# Patient Record
Sex: Female | Born: 1955 | Race: White | Hispanic: No | Marital: Married | Smoking: Former smoker
Health system: Southern US, Community
[De-identification: ages and names within clinical notes are randomized; demographics above are authoritative.]

## PROBLEM LIST (undated history)

## (undated) DIAGNOSIS — N189 Chronic kidney disease, unspecified: Secondary | ICD-10-CM

## (undated) DIAGNOSIS — J189 Pneumonia, unspecified organism: Secondary | ICD-10-CM

## (undated) DIAGNOSIS — G43909 Migraine, unspecified, not intractable, without status migrainosus: Secondary | ICD-10-CM

## (undated) DIAGNOSIS — M1990 Unspecified osteoarthritis, unspecified site: Secondary | ICD-10-CM

## (undated) DIAGNOSIS — M797 Fibromyalgia: Secondary | ICD-10-CM

## (undated) DIAGNOSIS — G709 Myoneural disorder, unspecified: Secondary | ICD-10-CM

## (undated) DIAGNOSIS — I639 Cerebral infarction, unspecified: Secondary | ICD-10-CM

## (undated) DIAGNOSIS — E119 Type 2 diabetes mellitus without complications: Secondary | ICD-10-CM

## (undated) DIAGNOSIS — E785 Hyperlipidemia, unspecified: Secondary | ICD-10-CM

## (undated) DIAGNOSIS — Z8739 Personal history of other diseases of the musculoskeletal system and connective tissue: Secondary | ICD-10-CM

## (undated) DIAGNOSIS — G4733 Obstructive sleep apnea (adult) (pediatric): Secondary | ICD-10-CM

## (undated) DIAGNOSIS — I10 Essential (primary) hypertension: Secondary | ICD-10-CM

## (undated) DIAGNOSIS — J449 Chronic obstructive pulmonary disease, unspecified: Secondary | ICD-10-CM

## (undated) HISTORY — DX: Myoneural disorder, unspecified: G70.9

## (undated) HISTORY — DX: Hyperlipidemia, unspecified: E78.5

## (undated) HISTORY — PX: TONSILLECTOMY: SUR1361

## (undated) HISTORY — DX: Essential (primary) hypertension: I10

## (undated) HISTORY — DX: Chronic obstructive pulmonary disease, unspecified: J44.9

## (undated) HISTORY — DX: Chronic kidney disease, unspecified: N18.9

## (undated) HISTORY — PX: HERNIA REPAIR: SHX51

---

## 2002-02-24 ENCOUNTER — Encounter: Payer: Self-pay | Admitting: Cardiovascular Disease

## 2002-02-24 ENCOUNTER — Inpatient Hospital Stay (HOSPITAL_COMMUNITY): Admission: EM | Admit: 2002-02-24 | Discharge: 2002-02-25 | Payer: Self-pay | Admitting: *Deleted

## 2004-09-28 ENCOUNTER — Ambulatory Visit: Payer: Self-pay | Admitting: Internal Medicine

## 2004-10-23 ENCOUNTER — Ambulatory Visit: Payer: Self-pay | Admitting: Internal Medicine

## 2004-11-07 ENCOUNTER — Ambulatory Visit: Payer: Self-pay | Admitting: Internal Medicine

## 2007-05-28 ENCOUNTER — Encounter (INDEPENDENT_AMBULATORY_CARE_PROVIDER_SITE_OTHER): Payer: Self-pay | Admitting: Internal Medicine

## 2007-05-28 ENCOUNTER — Inpatient Hospital Stay (HOSPITAL_COMMUNITY): Admission: EM | Admit: 2007-05-28 | Discharge: 2007-05-29 | Payer: Self-pay | Admitting: Emergency Medicine

## 2007-06-28 HISTORY — PX: LAPAROSCOPIC CHOLECYSTECTOMY: SUR755

## 2007-07-11 ENCOUNTER — Ambulatory Visit (HOSPITAL_COMMUNITY): Admission: RE | Admit: 2007-07-11 | Discharge: 2007-07-11 | Payer: Self-pay | Admitting: General Surgery

## 2007-07-11 ENCOUNTER — Encounter (INDEPENDENT_AMBULATORY_CARE_PROVIDER_SITE_OTHER): Payer: Self-pay | Admitting: General Surgery

## 2007-08-28 HISTORY — PX: UMBILICAL HERNIA REPAIR: SUR1181

## 2009-01-21 ENCOUNTER — Encounter: Admission: RE | Admit: 2009-01-21 | Discharge: 2009-01-21 | Payer: Self-pay | Admitting: Internal Medicine

## 2009-02-21 IMAGING — US US PELVIS COMPLETE
1 series · 14 of 25 positions shown · non-contrast
Comparison: NONE

CLINICAL DATA: Irregular menses. 

PELVIC ULTRASOUND WITH TRANSABDOMINAL AND TRANSVAGINAL IMAGING

[Series 1: us pelvis · 0.27mm/px · 14 of 40 slices shown]
[im 1/40]
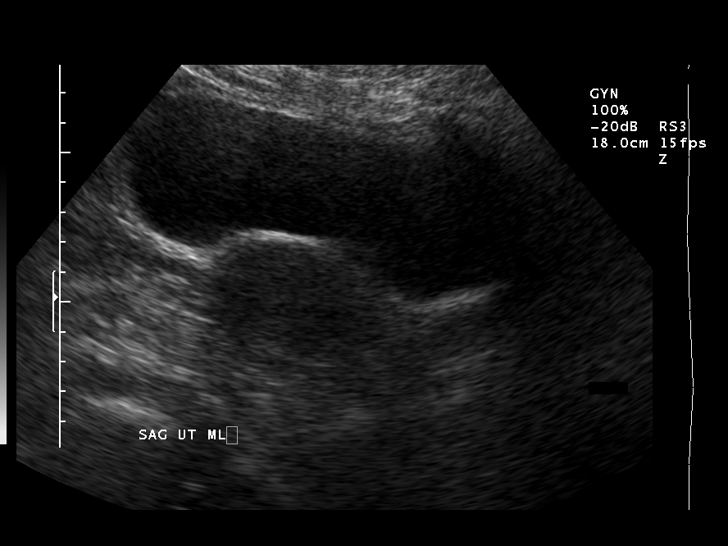
[im 4/40]
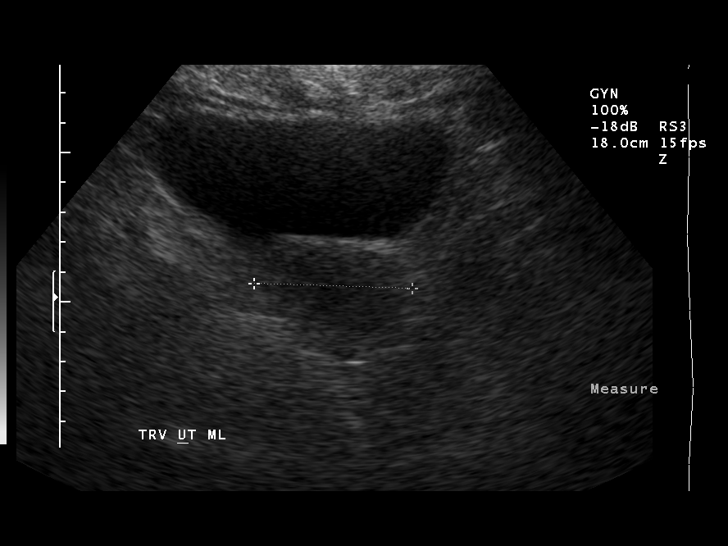
[im 7/40]
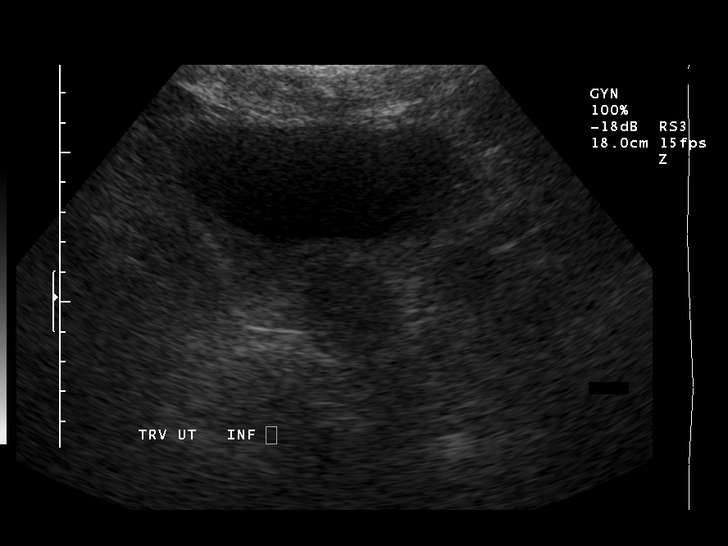
[im 10/40]
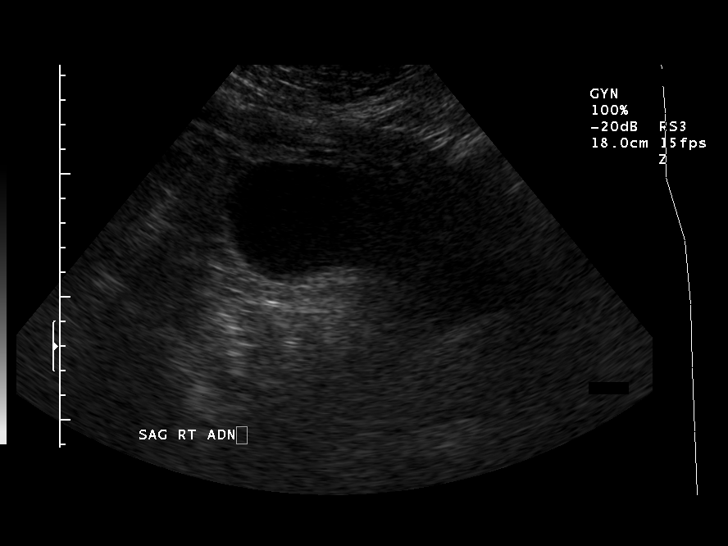
[im 14/40]
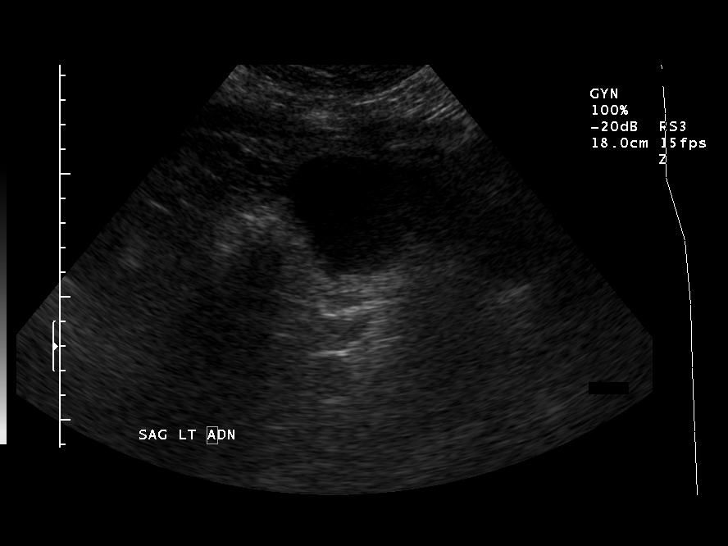
[im 15/40]
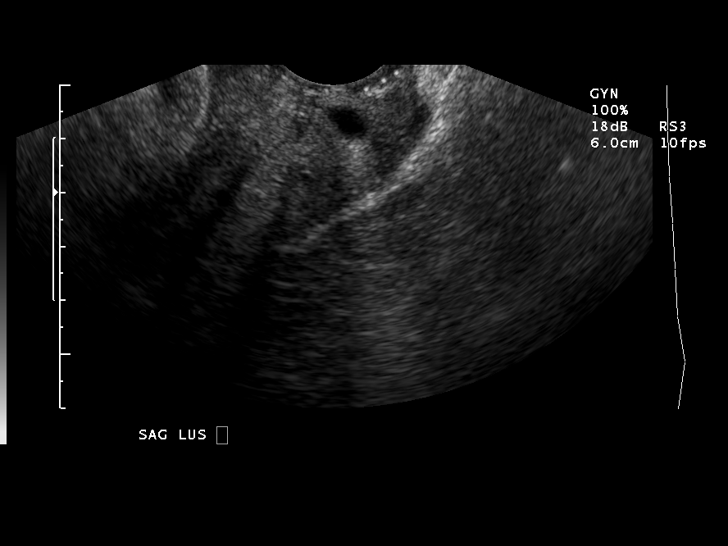
[im 18/40]
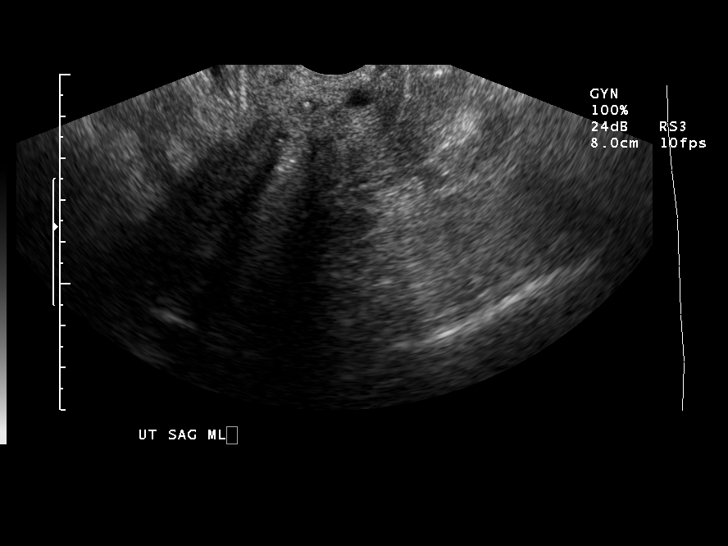
[im 22/40]
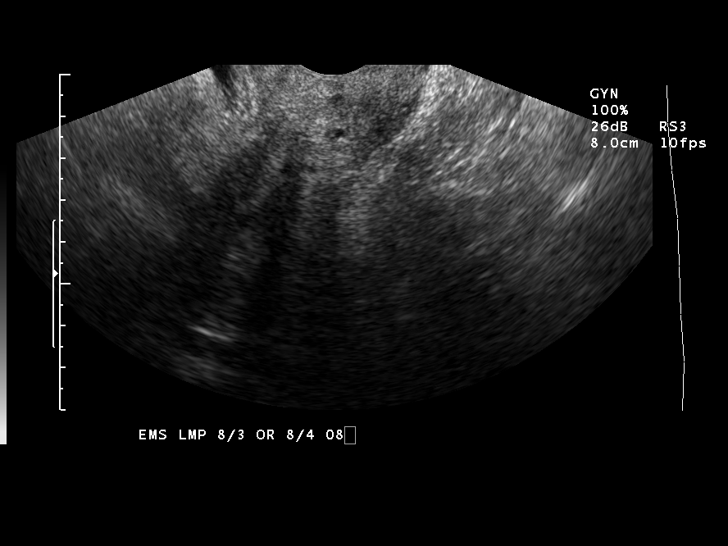
[im 25/40]
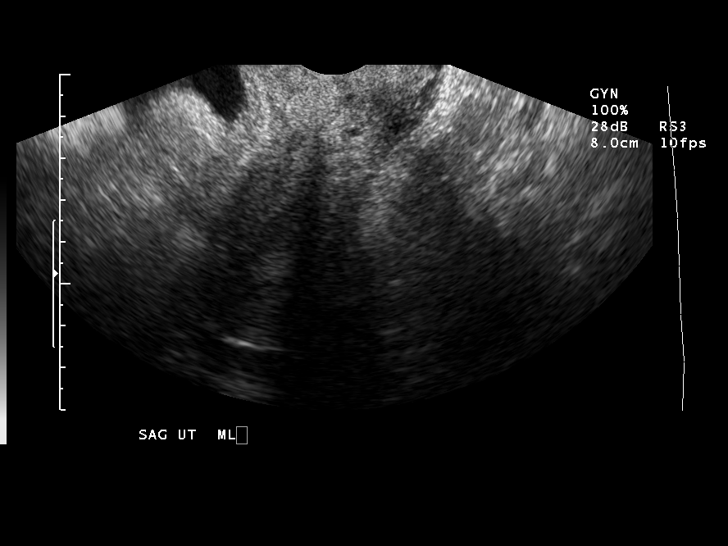
[im 27/40]
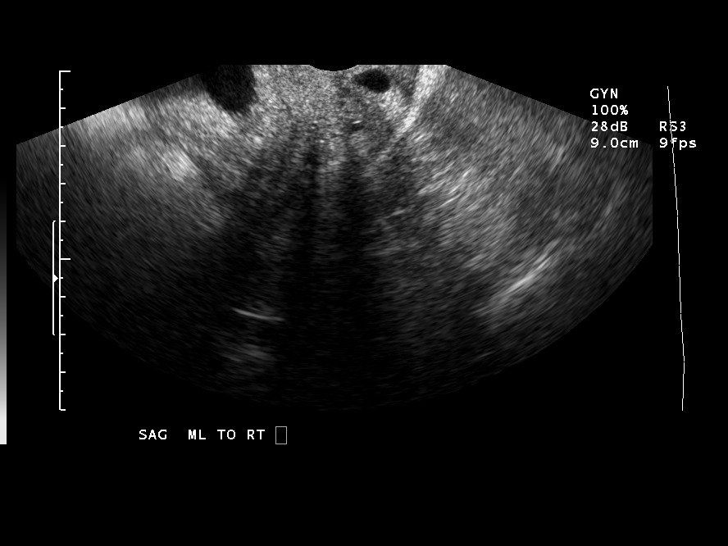
[im 30/40]
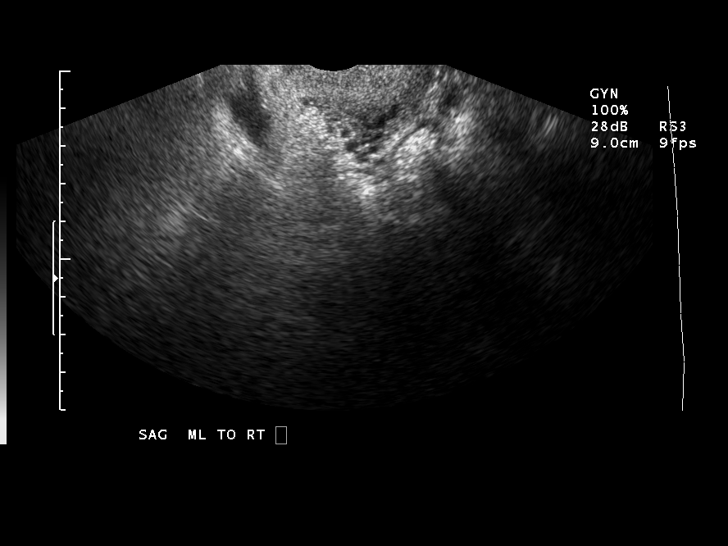
[im 33/40]
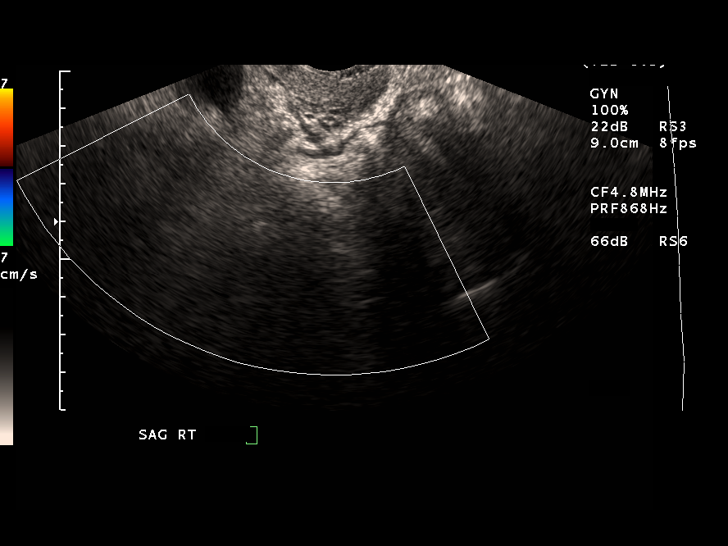
[im 36/40]
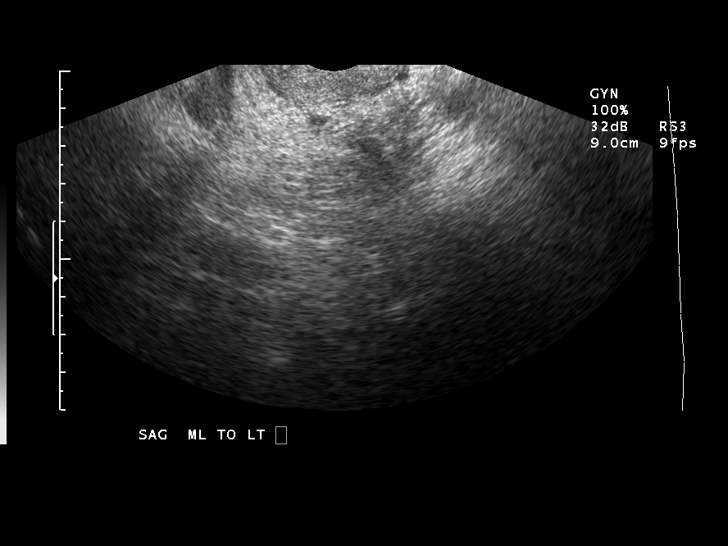
[im 40/40]
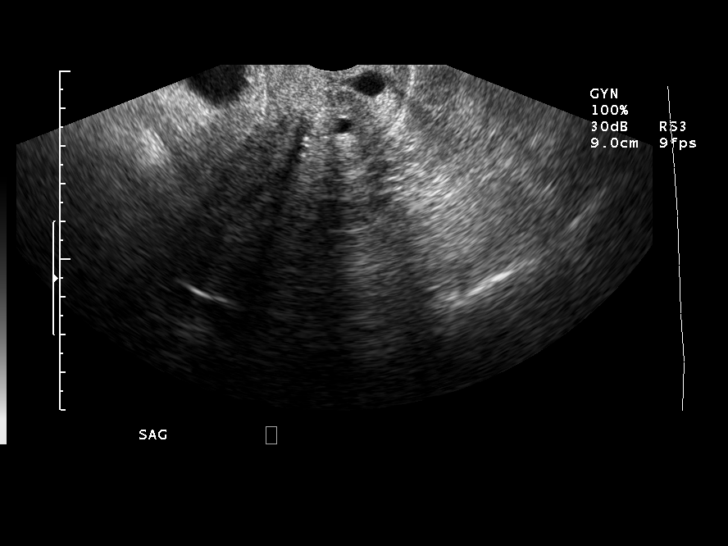

[14 of 25 positions shown; findings below may reference images not displayed]

FINDINGS: The uterus is 9.1 x 4.1 x 5.3 cm in size. Several 
cervical cysts are noted on the transvaginal views. The 
endometrial stripe is normal at 7.2 mm. Neither ovary could be 
identified on the transabdominal or transvaginal views.
IMPRESSION: Normal appearance of the uterus and endometrial 
stripe. Ovaries could not be visualized. Edwin Pakal Landcruiser, M.D. 
Date: 05/12/2007 DL  [REDACTED]

## 2010-06-17 ENCOUNTER — Inpatient Hospital Stay (HOSPITAL_COMMUNITY): Admission: EM | Admit: 2010-06-17 | Discharge: 2010-06-18 | Payer: Self-pay | Admitting: Emergency Medicine

## 2010-10-18 ENCOUNTER — Encounter (INDEPENDENT_AMBULATORY_CARE_PROVIDER_SITE_OTHER): Payer: Commercial Managed Care - PPO

## 2010-10-18 DIAGNOSIS — M7989 Other specified soft tissue disorders: Secondary | ICD-10-CM

## 2010-10-24 NOTE — Procedures (Unsigned)
DUPLEX DEEP VENOUS EXAM - LOWER EXTREMITY  INDICATION:  Bilateral lower extremity swelling, ankle induration.  HISTORY:  Edema:  10+ years. Trauma/Surgery: Pain:  No. PE:  No. Previous DVT:  No. Anticoagulants:  No. Other:  DUPLEX EXAM:               CFV   SFV   PopV  PTV      GSV               R  L  R  L  R  L  R    L   R  L Thrombosis    0  0  0  0  0  0  0    0   0  0 Spontaneous   +  +  +  +  +  +  0    0   +  + Phasic        +  +  +  +  +  +  NA   NA  +  + Augmentation  +  +  +  +  +  +  +    +   +  + Compressible  +  +  +  +  +  +  +    +   +  + Competent     +  +  +  +  +  +  +    +   0  NA  Legend:  + - yes  o - no  p - partial  D - decreased  IMPRESSION: 1. No DVT observed in the bilateral lower extremities. 2. 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 3. Reflux observed in the right greater saphenous vein.   _____________________________ Larina Earthly, M.D.  LT/MEDQ  D:  10/18/2010  T:  10/18/2010  Job:  [id number]

## 2010-11-08 LAB — BASIC METABOLIC PANEL
BUN: 18 mg/dL (ref 6–23)
BUN: 38 mg/dL — ABNORMAL HIGH (ref 6–23)
CO2: 31 mEq/L (ref 19–32)
Calcium: 8.2 mg/dL — ABNORMAL LOW (ref 8.4–10.5)
Chloride: 106 mEq/L (ref 96–112)
Chloride: 94 mEq/L — ABNORMAL LOW (ref 96–112)
Creatinine, Ser: 1.55 mg/dL — ABNORMAL HIGH (ref 0.4–1.2)
GFR calc Af Amer: 42 mL/min — ABNORMAL LOW (ref >60)
GFR calc non Af Amer: 35 mL/min — ABNORMAL LOW (ref >60)
GFR calc non Af Amer: 55 mL/min — ABNORMAL LOW (ref >60)
Glucose, Bld: 138 mg/dL — ABNORMAL HIGH (ref 70–99)
Glucose, Bld: 205 mg/dL — ABNORMAL HIGH (ref 70–99)
Potassium: 2.6 mEq/L — CL (ref 3.5–5.1)
Potassium: 3.4 mEq/L — ABNORMAL LOW (ref 3.5–5.1)
Sodium: 136 mEq/L (ref 135–145)

## 2010-11-08 LAB — COMPREHENSIVE METABOLIC PANEL
AST: 38 U/L — ABNORMAL HIGH (ref 0–37)
CO2: 37 mEq/L — ABNORMAL HIGH (ref 19–32)
Calcium: 9.7 mg/dL (ref 8.4–10.5)
Creatinine, Ser: 2.29 mg/dL — ABNORMAL HIGH (ref 0.4–1.2)
GFR calc Af Amer: 27 mL/min — ABNORMAL LOW (ref >60)
GFR calc non Af Amer: 22 mL/min — ABNORMAL LOW (ref >60)
Sodium: 128 mEq/L — ABNORMAL LOW (ref 135–145)
Total Protein: 7.6 g/dL (ref 6.0–8.3)

## 2010-11-08 LAB — GLUCOSE, CAPILLARY
Glucose-Capillary: 141 mg/dL — ABNORMAL HIGH (ref 70–99)
Glucose-Capillary: 388 mg/dL — ABNORMAL HIGH (ref 70–99)

## 2010-11-08 LAB — URINE MICROSCOPIC-ADD ON

## 2010-11-08 LAB — POTASSIUM: Potassium: 3.1 mEq/L — ABNORMAL LOW (ref 3.5–5.1)

## 2010-11-08 LAB — DIFFERENTIAL
Eosinophils Relative: 0 % (ref 0–5)
Lymphocytes Relative: 15 % (ref 12–46)
Lymphs Abs: 2.2 10*3/uL (ref 0.7–4.0)
Monocytes Relative: 7 % (ref 3–12)
Neutrophils Relative %: 77 % (ref 43–77)

## 2010-11-08 LAB — BLOOD GAS, ARTERIAL
Acid-Base Excess: 14.3 mmol/L — ABNORMAL HIGH (ref 0.0–2.0)
O2 Saturation: 96.8 %
Patient temperature: 98.6
TCO2: 30.8 mmol/L (ref 0–100)

## 2010-11-08 LAB — URINALYSIS, ROUTINE W REFLEX MICROSCOPIC
Nitrite: NEGATIVE
Specific Gravity, Urine: 1.024 (ref 1.005–1.030)
Urobilinogen, UA: 1 mg/dL (ref 0.0–1.0)
pH: 5 (ref 5.0–8.0)

## 2010-11-08 LAB — LIPID PANEL
Cholesterol: 170 mg/dL (ref 0–200)
HDL: 46 mg/dL (ref >39)
LDL Cholesterol: 87 mg/dL (ref 0–99)
Total CHOL/HDL Ratio: 3.7 RATIO
Triglycerides: 187 mg/dL — ABNORMAL HIGH (ref <150)
VLDL: 37 mg/dL (ref 0–40)

## 2010-11-08 LAB — CBC
Hemoglobin: 14.9 g/dL (ref 12.0–15.0)
MCH: 33.3 pg (ref 26.0–34.0)
MCHC: 35 g/dL (ref 30.0–36.0)
RDW: 14.6 % (ref 11.5–15.5)

## 2010-11-08 LAB — LIPASE, BLOOD: Lipase: 68 U/L — ABNORMAL HIGH (ref 11–59)

## 2010-11-08 LAB — HEMOGLOBIN A1C
Hgb A1c MFr Bld: 9.6 % — ABNORMAL HIGH (ref <5.7)
Mean Plasma Glucose: 229 mg/dL — ABNORMAL HIGH (ref <117)

## 2010-11-08 LAB — MAGNESIUM
Magnesium: 2.1 mg/dL (ref 1.5–2.5)
Magnesium: 2.3 mg/dL (ref 1.5–2.5)

## 2011-01-09 NOTE — H&P (Signed)
Jade [name], SOKOLOW             ACCOUNT NO.:  [id number]   MEDICAL RECORD NO.:  [id number]          PATIENT TYPE:  OBV   LOCATION:  1442                         FACILITY:  [facility]   PHYSICIAN:  Altha Harm, MDDATE OF BIRTH:  1956-04-03   DATE OF ADMISSION:  05/27/2007  DATE OF DISCHARGE:                              HISTORY & PHYSICAL   CHIEF COMPLAINT:  Abdominal pain and progressive exertional dyspnea.   HISTORY OF PRESENT ILLNESS:  This is a 55 year old lady who reports that  for the over the last 10 years she has been having worsening shortness  of breath. The patient has been treated with Spiriva and Advair in the  past by her allergist and has been on diuretics by her primary care  physician. As far as the patient knows, she does not have a history of  congestive heart failure but does have obstructive sleep apnea. She  states that in the last few weeks she has noted that she has been  becoming significantly more short of breath with minimal activity. She  states in the last two days she has been unable to perform her ADLs  secondary to the shortness of breath that she is experiencing. The  patient has had no fevers or chills. She has had nausea, vomiting or  diarrhea. Incidentally, the patient was sent to have a workup for  abdominal pain approximately two weeks ago and was found to have  gallstones. Because of the symptoms of abdominal pain that she has been  having, she has been referred to surgery and Olin Pia for evaluation  for surgery. In the emergency room today, the patient had a chest x-ray  performed which was read as acute interstitial edema. However, please  note that the patient has long-standing lymphedema.   The patient states that she has had a cardiology workup in the past, and  she was told by the cardiologist, Dr. Donnie Aho, that unless some drastic  changes were made she would not need to worry about retirement,  indicating that her life  expectancy would be shortened. The patient  during her workup here in the emergency room had an ABG done and was  found to be mildly hypoxic at 72, although her O2 saturations were  normal. The patient was told by the emergency room physician that she  needed admission, and now she is afraid to go home. Thus, the patient is  being brought in on an observation basis.   PAST MEDICAL HISTORY:  Is significant for:  1. Diabetes type 2.  2. Morbid obesity.  3. Obstructive sleep apnea.  4. Bilateral lower extremity edema, requiring the use of Lasix and      Zaroxolyn.   FAMILY HISTORY:  Significant for congestive heart failure in father, CVA  in her father and Alzheimer's dementia in her mother.   On social history, the patient works in an Scientist, research (physical sciences). She denies  any recent tobacco use. She uses no alcohol or drugs.   CURRENT MEDICATIONS:  1. Zaroxolyn.  2. Lasix.  3. Actos.  4. Metformin.  5. Potassium chloride.  6. Toprol-XL.  7. Benicar.   The patient does not know the doses of any of her medications.   ALLERGIES:  ARE TO CONTRAST MEDIA AND SULFA.   Her primary care physician is Dr. Wende Bushy in [facility], and her  cardiologist is Dr. Donnie Aho, also in [facility].   REVIEW OF SYSTEMS:  The patient reports pain in her right lower pleural  area in the right upper quadrant which she states has been there for  several months. The patient states that taking a deep breath yesterday  caused excruciating pain and that she was only able to take very shallow  breaths. She states that this has improved since receiving Lasix in the  emergency room. Otherwise, her review of systems are negative in the  review of 14 systems.   The patient has been diagnosed with obstructive sleep apnea and wears  CPAP at home. However, her CPAP has not been reevaluated for 2 years,  and the pressures have not been changed in that time. The patient denies  any PND or orthopnea. She denies any chest  pain. She denies any  excessive swelling of her lower extremities.   In the emergency room, studies performed included the following:  A  urinalysis which was negative for elements consistent with a urinary  tract infection. White blood cell count is 9.8, hemoglobin is 12.2,  hematocrit is 35.2, platelet count 307. Sodium 137, potassium 3.9,  chloride 107, bicarb 24, BUN 13, creatinine 0.8. D-dimer is negative at  0.37. A chest x-ray, two-view, was done which was read by the  radiologist as acute interstitial edema versus atypical pneumonia.   PHYSICAL EXAMINATION:  The patient is a very obese lady with very obese  expansive chest wall. The patient has a body habitus that is very  consistent with someone who may have pulmonary hypertension.  The patient has a very short neck. I am unable to evaluate her neck. Her  neck size is equally more than 30 cm.  HEENT EXAMINATION:  She is normocephalic, atraumatic. Pupils are equal,  round, and reactive to light and accommodation. Oropharynx is moist. No  exudate, erythema or lesions noted.  NECK EXAMINATION:  Again, the patient has a very short and obese neck,  thus prohibiting any meaningful neck examination.  CHEST EXAMINATION:  The patient has a very broad chest. She has normal  respiratory effort. There is no conversational dyspnea, and the  patient's lungs are clear to auscultation. The patient is sitting at  rest without any oxygen and appears to be in no distress whatsoever.  CARDIOVASCULAR:  The patient has distant heart sounds. I unable to  really ascertain whether or not she has a displacement in her PMI. She  has a normal S1 and S2, and there are no murmurs, rubs, or gallops  noted. No heaves or thrills on palpation.  ABDOMEN:  The patient has a markedly obese abdomen. She has no  significant tenderness on palpation. I am unable to appreciate any  masses or hepatosplenomegaly.  LYMPH NODE SURVEY:  She has got no cervical, axillary,  inguinal  lymphadenopathy.  MUSCULOSKELETAL:  Her bilateral lower extremities are edematous.  However, there is no significant pitting which indicates possibly long-  standing edema.  NEUROLOGICAL:  She has no focal neurologically deficits. Cranial nerves  II-XII are grossly intact.  MUSCULOSKELETAL:  The patient has no warmth, swelling or erythema around  the joints.  PSYCHIATRIC:  She is alert and oriented x3. She has normal insight and  cognition. She has good recent and remote recall.   ASSESSMENT AND PLAN:  This is a patient who presents with progressive  exertional dyspnea and findings on x-ray which may or may not represent  an acute process. Quite frankly, I do not believe that this patient has  any acute process, and I believe that she may have pulmonary  hypertension with some degree of cor pulmonale. The patient has received  IV Lasix, and at this point, I do not know her dose of Lasix that she  takes, so we will continue the IV Lasix to help in any excessive fluid  that she may be having. We will also get a 2-D echocardiogram to  evaluate for both right and left heart function and anatomy and also to  attempt to qualify whether the patient has any degree of pulmonary  hypertension. The patient is under the care of the cardiologist in [facility]. Thus, I do not see any reason to involve a cardiologist here as  the patient is not having any symptoms that would point to an acute  cardiac process. However, this patient may be benefit from consultation  with pulmonary once EF has been received. Certainly, the patient will  need to go home on oxygen, and as an outpatient, she will need to have  her CPAP reevaluated for need for increased pressure. The patient had  her sleep study done at [facility] and would like to  go back there for reevaluation of her CPAP and obstructive sleep apnea.  Once the patient has received her oxygen and restarted her back on her   usual medications, the patient can probably be discharged for the rest  of her evaluation to be accomplished as an outpatient. Just for  completeness, I will get troponins q.8h. to ensure the patient is not  having cardiac event. The patient will probably benefit from a CT with  contrast to better define her lung findings. However, the patient is  allergic to contrast media, and I am unsure that a CT without contrast  would be of much benefit. These are the measures we will take for the  patient's progressive exertional dyspnea, in addition to which the  patient will be continued on her medications for her hypertension and  her diabetes.      Altha Harm, MD  Electronically Signed     MAM/MEDQ  D:  05/27/2007  T:  05/28/2007  Job:  [id number]

## 2011-01-09 NOTE — Op Note (Signed)
Jade [name], Jade [name]             ACCOUNT NO.:  [id number]   MEDICAL RECORD NO.:  [id number]          PATIENT TYPE:  AMB   LOCATION:  DAY                          [facility]ACILITY[facility]  [facility]   PHYSICIAN:  Lennie Muckle, MD      DATE OF BIRTH:  [birth year]   DATE OF PROCEDURE:  07/11/2007  DATE OF DISCHARGE:                               OPERATIVE REPORT   PROCEDURE:  1. Laparoscopic cholecystectomy  2. Repar of umbilical hernia.   PREOP DIAGNOSIS:  1. Symptomatic cholelithiasis.  2. Umbilical hernia.   POSTOP DIAGNOSIS:  1. Symptomatic cholelithiasis.  2. Umbilical hernia.   SURGEON:  Bertram Savin, MD.   ASSISTANTMarijean Niemann.   FINDINGS:  Omental within the hernia sac at the umbilicus, enlarged  liver.   INDICATIONS FOR PROCEDURE:  Jade [name] is a 55 year old female who  had multiple years of difficulties with her gallbladder.  She did have  gallstones and intermittent right upper quadrant abdominal pain,  bloating and nausea.  She had also had complaints of umbilical hernia.  Informed consent was obtained for a laparoscopic cholecystectomy and  possible cholangiogram with possibility of repairing her hernia.   SPECIMEN:  Gallbladder.   ESTIMATED BLOOD LOSS:  Minimal.   No immediate complications.  No drains were placed.  General  endotracheal anesthesia.   DETAILS OF PROCEDURE:  [name] was identified in the preoperative  holding suite, taken to the operating room where she was placed in the  supine position.  After administration of general endotracheal  anesthesia a time-out procedure indicating the patient and the procedure  were performed.  Her abdomen was prepped and draped in the usual sterile  fashion.  Due to the umbilical hernia being present at the umbilicus, I  elected to place a 5 mm port in the epigastric region.  This was placed  with the Optiview under direct visualization with a camera.  The fascial  layers were identified upon entry into the  abdominal cavity.  Pneumoperitoneum was able to be established.  Upon inspection of the  abdomen omentum was noted to be incarcerated within the umbilical  hernia.  This was removed by blunt dissection after placing a 5 mm  trocar in the right side of the abdomen.  After fully reducing the  hernia an incision was placed through the skin above the defect of the  hernia.  A Hasson trocar was then placed into the abdominal cavity.  An  additional 5 mm trocar was then placed in the right side of the abdomen.  The fundus of the gallbladder was retracted up towards the head of the  patient.  The infundibulum was grasped away from the liver bed.  It was  a rather large liver and heavy which hindered some part of the  dissection, however, after carefully dissecting the peritoneum down near  the fundus of the gallbladder I was able to obtain a critical view of  the neck of the gallbladder as well as the cystic artery and the liver  bed.  The #5 mm trocar was upsized to a #11 mm  trocar.  Using a clip  applier, 3 clips were placed proximally on the distal portion of the  cystic duct, one proximally was transected with laparoscopic scissors.  Two clips were placed on the cystic artery, one distally was also  transected with laparoscopic scissors.  The remaining peritoneal  attachments were removed with the hook electrocautery.  There was a  minimal amount of spillage within the removal of the gallbladder.  It  was placed in an EndoCatch bag and removed from the abdomen.  The  abdomen was irrigated with 1 liter of saline.  No evidence of bleeding  or biliary drainage was noted at the end of the case.  The fascial  defect was closed with #0 Vicryl suture.  Skin was closed with #4-0  Monocryl.  Patient was then extubated and transported to the Post  Anesthesia Care Unit in stable condition.      Lennie Muckle, MD  Electronically Signed     ALA/MEDQ  D:  07/11/2007  T:  07/11/2007  Job:  [id number]

## 2011-01-09 NOTE — Discharge Summary (Signed)
NAMESERENIDY, [name]             ACCOUNT NO.:  [id number]   MEDICAL RECORD NO.:  [id number]          PATIENT TYPE:  INP   LOCATION:  1442                         FACILITY:  [facility]   PHYSICIAN:  Michaelyn Barter, M.D. DATE OF BIRTH:  06-07-1956   DATE OF ADMISSION:  05/27/2007  DATE OF DISCHARGE:  05/29/2007                               DISCHARGE SUMMARY   PRIMARY CARE PHYSICIAN:  Unknown.   FINAL DIAGNOSIS:  Exertional dyspnea.   SECONDARY DIAGNOSIS:  History of obstructive sleep apnea.   HISTORY OF PRESENT ILLNESS:  Jade [name] is a 55 year old female who  indicated that, over the last 10 years, she has experienced worsening  shortness of breath.  She follows up with an allergist.  She stated  that, over the few weeks leading up to admission, her shortness of  breath had progressed with minimal activity.  Over the two days leading  up to admission, she was unable to perform her ADLs secondary to  worsening shortness of breath.  She indicated that she had a  cardiovascular workup in the past and was told by her cardiologist, Dr.  Donnie Aho.   PAST MEDICAL HISTORY:  Please see that dictated by Dr. Marthann Schiller.   HOSPITAL COURSE:  #1.  ACUTE ON CHRONIC DYSPNEA:  Following admission to the emergency  department, an ABG was completed,  and the patient was found to be  mildly hypoxic.  A two-view chest x-ray was completed September 30.  It  revealed developing pulmonary interstitial edema.  Less likely, the  appearance could reflect interstitial-atypical pneumonia.  A CT scan of  the patient's chest was completed on the same date which revealed small  right effusion with overlying right lower lobe atelectasis.  Minimal  left basilar atelectasis was noted.  A few patchy areas of ground-glass  opacity with slightly mild inflammatory alveolitis was noted.  No  pulmonary edema or pneumonia were seen.  The patient was started on  oxygen.  A 2-D echocardiogram was completed on  October 1 which revealed  an overall left ventricular systolic function to be normal.  Left  ventricular EF was estimated to be 60%.  No diagnostic evidence of left  ventricular regional wall motion abnormalities were noted.  It was not  clear as to whether the patient's underlying etiology was pulmonary  versus cardiovascular related.  The patient does have a history of  obstructive sleep apnea, whether or not this contributed to her dyspnea  is questionable. Atelectasis versus the small pleural effusion may have  also contributed.  By the date of discharge, the patient indicated that  she felt better.  She did not demonstrate any obvious respiratory-  related complaints or distress during this hospitalization, and her  oxygen saturation remained in the upper 90s on room air.   #2.  HISTORY OF GALLSTONES:  The patient indicated that she had been  told she had gallstones in the past.  She never complained of any  abdominal pain during the course of this hospitalization.  She indicated  that she could follow up with general surgery after she was discharged.  By the date of discharge, the patient had no new complaints.  Her  temperature was 97.5, heart rate 77, respirations 23, blood pressure  130/67, O2 saturation 98% on room air.  The decision was made to  discharge the patient from the hospital.   She discharged home on:  1. Combivent 2 puffs 4 times a day.  2. Protonix 40 mg daily.   She was told to call Dr. Brandt Loosen for a followup appointment within  the next 1-2 weeks.      Michaelyn Barter, M.D.  Electronically Signed     OR/MEDQ  D:  06/28/2007  T:  06/28/2007  Job:  [id number]

## 2011-01-12 NOTE — Discharge Summary (Signed)
[facility]  Patient:    Jade [name], Jade [name] Visit Number: [id number] MRN: [id number]          Service Type: MED Location: [phone] Attending Physician:  Berry, Jonathan [place] Dictated by:   Marya Fossa, P.A. Admit Date:  02/24/2002 Discharge Date: 02/25/2002   CC:         Rafaela M. Riley Nearing, M.D.   Discharge Summary  DATE OF BIRTH: 28-Jan-1956  ADMISSION DIAGNOSES: 1. Chest pain rule out myocardial infarction. 2. No prior history of coronary artery disease. 3. Fibromyalgia. 4. Remote tobacco use.  DISCHARGE DIAGNOSES: Chest pain, resolved, ruled out with negative enzymes. Likely non-cardiac. Spiral CT negative for pulmonary embolism or aortic dissection.  HISTORY OF PRESENT ILLNESS: [name] is a 55 year old white female patient without history of coronary artery disease, diabetes, or family history of heart disease. She quit smoking in 1989. She does not know her lipid status. She does have fibromyalgia and obesity. Apparently on the morning of admission, she had pain in the chest around 10:00 oclock in the morning while she was working at her desk. There was no radiation. The pain was worse with deep breathing but no shortness of breath, diaphoresis, nausea, or vomiting. The symptoms persisted for another 45 minutes. She called EMS. They gave her one Nitroglycerin and one in the ER. No significant change. O2 improved her pain and then in addition, Nitroglycerin helped more.  PROCEDURES: None.  CONSULTATIONS: None.  COMPLICATIONS: None.  HOSPITAL COURSE: [name] was admitted to [facility] for chest pain, rule out MI on February 24, 2002. IV Heparin was started. Protonix and Vioxx were given. Coated aspirin was also given. Cardiac enzymes were negative times three. The patient remained stable overnight. She had no further chest pain. She had a spiral CT which was negative for pulmonary embolism, DVT,  or CV-dissection.  LABORATORY DATA: Sodium 138, potassium 4.2, BUN 14, creatinine 1.1, and glucose 141. Hemoglobin 13.5, INR 0.9.  DISPOSITION: It was felt that the patient was stable for discharge to home on February 25, 2002 by Dr. Domingo Sep.  Recommend follow-up outpatient Cardiolite.  DISCHARGE MEDICATIONS: 1. Enteric coated aspirin 81 mg per day. 2. Vioxx 25 mg per day for one week and then as needed. 3. Protonix 40 mg per day for one week and then as needed.  ACTIVITY: As tolerated with no strenuous activity until seen back in follow-up.  DIET: Low fat, low cholesterol, low salt diet.  SPECIAL INSTRUCTIONS: She will have a stress test on March 02, 2002 at 8:00 AM on the second floor of Dr. Renelda Mom office. She will follow-up with Dr. Allyson Sabal on March 04, 2002 at 10:30 AM. She may return to work on Monday. Dictated by:   Marya Fossa, P.A. Attending Physician:  Berry, Jonathan [place] DD:  02/25/02 TD:  02/27/02 Job: [phone] [id number]

## 2011-06-05 LAB — URINALYSIS, ROUTINE W REFLEX MICROSCOPIC
Glucose, UA: NEGATIVE
Hgb urine dipstick: NEGATIVE
Specific Gravity, Urine: 1.021
Urobilinogen, UA: 0.2

## 2011-06-05 LAB — BASIC METABOLIC PANEL
BUN: 33 — ABNORMAL HIGH
CO2: 34 — ABNORMAL HIGH
Chloride: 99
Creatinine, Ser: 1.03

## 2011-06-05 LAB — URINE MICROSCOPIC-ADD ON

## 2011-06-05 LAB — HEMOGLOBIN AND HEMATOCRIT, BLOOD: HCT: 37.3

## 2011-06-07 LAB — HEPATIC FUNCTION PANEL
ALT: 20
AST: 21
Albumin: 3.6
Alkaline Phosphatase: 44
Bilirubin, Direct: <0.1
Total Bilirubin: 0.8
Total Protein: 6.6

## 2011-06-07 LAB — DIFFERENTIAL
Basophils Absolute: 0
Basophils Absolute: 0.3 — ABNORMAL HIGH
Basophils Relative: 1
Basophils Relative: 3 — ABNORMAL HIGH
Eosinophils Absolute: 0.2
Eosinophils Absolute: 0.2
Eosinophils Relative: 3
Eosinophils Relative: 2
Lymphocytes Relative: 23
Lymphs Abs: 2.3
Monocytes Absolute: 0.5
Monocytes Absolute: 0.8 — ABNORMAL HIGH
Monocytes Relative: 6
Monocytes Relative: 8
Neutro Abs: 6.2
Neutrophils Relative %: 64

## 2011-06-07 LAB — COMPREHENSIVE METABOLIC PANEL
ALT: 16
AST: 16
Albumin: 3.2 — ABNORMAL LOW
Alkaline Phosphatase: 36 — ABNORMAL LOW
BUN: 7
Chloride: 104
Potassium: 3.5
Sodium: 136
Total Bilirubin: 0.8
Total Protein: 6.1

## 2011-06-07 LAB — CBC
HCT: 32.6 — ABNORMAL LOW
HCT: 35.2 — ABNORMAL LOW
Hemoglobin: 12.2
MCHC: 34.8
MCV: 93.4
Platelets: 261
Platelets: 307
RBC: 3.77 — ABNORMAL LOW
RDW: 14.4 — ABNORMAL HIGH
WBC: 7.9
WBC: 9.8

## 2011-06-07 LAB — POCT CARDIAC MARKERS
CKMB, poc: 1.1
Myoglobin, poc: 94.9
Operator id: 4001
Troponin i, poc: <0.05

## 2011-06-07 LAB — BASIC METABOLIC PANEL
BUN: 13
CO2: 24
Calcium: 8.7
GFR calc non Af Amer: >60
Glucose, Bld: 124 — ABNORMAL HIGH
Sodium: 137

## 2011-06-07 LAB — BASIC METABOLIC PANEL WITH GFR
Chloride: 107
Creatinine, Ser: 0.88
GFR calc Af Amer: >60
Potassium: 3.9

## 2011-06-07 LAB — BLOOD GAS, ARTERIAL
Acid-base deficit: 0.8
Bicarbonate: 23
Drawn by: 275531
O2 Saturation: 94.3
Patient temperature: 98.6
TCO2: 20.8
pCO2 arterial: 37
pH, Arterial: 7.41 — ABNORMAL HIGH
pO2, Arterial: 72.7 — ABNORMAL LOW

## 2011-06-07 LAB — D-DIMER, QUANTITATIVE: D-Dimer, Quant: 0.37

## 2011-06-07 LAB — URINALYSIS, ROUTINE W REFLEX MICROSCOPIC
Bilirubin Urine: NEGATIVE
Glucose, UA: NEGATIVE
Hgb urine dipstick: NEGATIVE
Ketones, ur: NEGATIVE
Nitrite: NEGATIVE
Protein, ur: NEGATIVE
Specific Gravity, Urine: 1.012
Urobilinogen, UA: 0.2
pH: 5.5

## 2011-06-07 LAB — B-NATRIURETIC PEPTIDE (CONVERTED LAB): Pro B Natriuretic peptide (BNP): 162 — ABNORMAL HIGH

## 2012-03-20 ENCOUNTER — Other Ambulatory Visit (INDEPENDENT_AMBULATORY_CARE_PROVIDER_SITE_OTHER): Payer: Medicare Other

## 2012-03-20 ENCOUNTER — Encounter: Payer: Self-pay | Admitting: Internal Medicine

## 2012-03-20 ENCOUNTER — Ambulatory Visit (INDEPENDENT_AMBULATORY_CARE_PROVIDER_SITE_OTHER): Payer: Medicare Other | Admitting: Internal Medicine

## 2012-03-20 VITALS — BP 108/70 | HR 72 | Temp 98.4°F | Resp 16 | Ht 62.0 in | Wt 221.0 lb

## 2012-03-20 DIAGNOSIS — E7800 Pure hypercholesterolemia, unspecified: Secondary | ICD-10-CM

## 2012-03-20 DIAGNOSIS — I872 Venous insufficiency (chronic) (peripheral): Secondary | ICD-10-CM

## 2012-03-20 DIAGNOSIS — N058 Unspecified nephritic syndrome with other morphologic changes: Secondary | ICD-10-CM

## 2012-03-20 DIAGNOSIS — Z1231 Encounter for screening mammogram for malignant neoplasm of breast: Secondary | ICD-10-CM

## 2012-03-20 DIAGNOSIS — E1149 Type 2 diabetes mellitus with other diabetic neurological complication: Secondary | ICD-10-CM | POA: Insufficient documentation

## 2012-03-20 DIAGNOSIS — E1129 Type 2 diabetes mellitus with other diabetic kidney complication: Secondary | ICD-10-CM

## 2012-03-20 DIAGNOSIS — Z23 Encounter for immunization: Secondary | ICD-10-CM

## 2012-03-20 DIAGNOSIS — Z79899 Other long term (current) drug therapy: Secondary | ICD-10-CM

## 2012-03-20 DIAGNOSIS — R609 Edema, unspecified: Secondary | ICD-10-CM

## 2012-03-20 LAB — CBC WITH DIFFERENTIAL/PLATELET
Basophils Absolute: 0.1 10*3/uL (ref 0.0–0.1)
HCT: 39 % (ref 36.0–46.0)
Lymphs Abs: 3.5 10*3/uL (ref 0.7–4.0)
MCV: 94.2 fl (ref 78.0–100.0)
Monocytes Absolute: 0.7 10*3/uL (ref 0.1–1.0)
Monocytes Relative: 6.1 % (ref 3.0–12.0)
Neutrophils Relative %: 61.2 % (ref 43.0–77.0)
Platelets: 379 10*3/uL (ref 150.0–400.0)
RDW: 13.3 % (ref 11.5–14.6)

## 2012-03-20 LAB — COMPREHENSIVE METABOLIC PANEL
ALT: 20 U/L (ref 0–35)
Albumin: 4 g/dL (ref 3.5–5.2)
CO2: 29 mEq/L (ref 19–32)
GFR: 45.64 mL/min — ABNORMAL LOW (ref >60.00)
Potassium: 3.9 mEq/L (ref 3.5–5.1)
Sodium: 134 mEq/L — ABNORMAL LOW (ref 135–145)
Total Bilirubin: 0.6 mg/dL (ref 0.3–1.2)
Total Protein: 7.8 g/dL (ref 6.0–8.3)

## 2012-03-20 LAB — URINALYSIS, ROUTINE W REFLEX MICROSCOPIC
Bilirubin Urine: NEGATIVE
Ketones, ur: NEGATIVE
Leukocytes, UA: NEGATIVE
Nitrite: NEGATIVE
Urobilinogen, UA: 0.2 (ref 0.0–1.0)
pH: 5.5 (ref 5.0–8.0)

## 2012-03-20 LAB — HEMOGLOBIN A1C: Hgb A1c MFr Bld: 10.5 % — ABNORMAL HIGH (ref 4.6–6.5)

## 2012-03-20 LAB — LIPID PANEL
Total CHOL/HDL Ratio: 5
Triglycerides: 164 mg/dL — ABNORMAL HIGH (ref 0.0–149.0)

## 2012-03-20 LAB — TSH: TSH: 3.38 u[IU]/mL (ref 0.35–5.50)

## 2012-03-20 MED ORDER — LINAGLIPTIN 5 MG PO TABS: 5.0000 mg | ORAL_TABLET | Freq: Every day | ORAL | Status: DC

## 2012-03-20 MED ORDER — INSULIN GLARGINE 100 UNIT/ML ~~LOC~~ SOLN: 30.0000 [IU] | Freq: Every day | SUBCUTANEOUS | Status: DC

## 2012-03-20 MED ORDER — EZETIMIBE-ATORVASTATIN 10-20 MG PO TABS: 1.0000 | ORAL_TABLET | Freq: Every day | ORAL | Status: DC

## 2012-03-20 MED ORDER — VASCULERA PO TABS: 1.0000 | ORAL_TABLET | Freq: Every day | ORAL | Status: DC

## 2012-03-20 MED ORDER — INSULIN PEN NEEDLE 31G X 8 MM MISC: 1.0000 | Freq: Four times a day (QID) | Status: DC

## 2012-03-20 MED ORDER — INSULIN ASPART 100 UNIT/ML ~~LOC~~ SOLN: 5.0000 [IU] | Freq: Three times a day (TID) | SUBCUTANEOUS | Status: DC

## 2012-03-20 NOTE — Assessment & Plan Note (Signed)
I have asked her to start vasculera and to decrease the edema in her legs

## 2012-03-20 NOTE — Assessment & Plan Note (Signed)
She will continue lasix and will start vasculera

## 2012-03-20 NOTE — Assessment & Plan Note (Signed)
She needs to start a statin 

## 2012-03-20 NOTE — Addendum Note (Signed)
Addended by: Etta Grandchild on: 03/20/2012 08:07 PM   Modules accepted: Orders

## 2012-03-20 NOTE — Assessment & Plan Note (Signed)
Her a1c is too high so I have asked her to start Novolog with meals, start tradjenta, and increase her lantus, also asked her to see diabetic education

## 2012-03-20 NOTE — Progress Notes (Signed)
Subjective:    Patient ID: Jade [name], female    DOB: February 14, 1956, 56 y.o.   MRN: [id number]  Diabetes She presents for her follow-up diabetic visit. She has type 2 diabetes mellitus. The initial diagnosis of diabetes was made 7 years ago. Her disease course has been worsening. There are no hypoglycemic associated symptoms. Pertinent negatives for hypoglycemia include no pallor. Associated symptoms include foot paresthesias, polydipsia, polyphagia and polyuria. Pertinent negatives for diabetes include no blurred vision, no chest pain, no fatigue, no foot ulcerations, no visual change, no weakness and no weight loss. There are no hypoglycemic complications. Symptoms are worsening. Diabetic complications include nephropathy and peripheral neuropathy. Current diabetic treatment includes intensive insulin program. She is compliant with treatment all of the time. Her weight is stable. She is following a generally healthy diet. Meal planning includes avoidance of concentrated sweets. She has not had a previous visit with a dietician. She never participates in exercise. Her home blood glucose trend is increasing steadily. Her breakfast blood glucose range is generally 180-200 mg/dl. Her lunch blood glucose range is generally >200 mg/dl. Her dinner blood glucose range is generally >200 mg/dl. Her highest blood glucose is >200 mg/dl. Her overall blood glucose range is >200 mg/dl. An ACE inhibitor/angiotensin II receptor blocker is contraindicated. She does not see a podiatrist.Eye exam is not current.      Review of Systems  Constitutional: Negative for fever, chills, weight loss, diaphoresis, activity change, appetite change, fatigue and unexpected weight change.  HENT: Negative.   Eyes: Negative.  Negative for blurred vision.  Respiratory: Negative for cough, chest tightness, shortness of breath, wheezing and stridor.   Cardiovascular: Positive for leg swelling. Negative for chest pain and palpitations.    Gastrointestinal: Negative for nausea, vomiting, abdominal pain, diarrhea, constipation, anal bleeding and rectal pain.  Genitourinary: Positive for polyuria.  Musculoskeletal: Negative for myalgias, back pain, joint swelling, arthralgias and gait problem.  Skin: Negative for color change, pallor, rash and wound.  Neurological: Negative.  Negative for weakness.  Hematological: Positive for polydipsia and polyphagia. Negative for adenopathy. Does not bruise/bleed easily.  Psychiatric/Behavioral: Negative.        Objective:   Physical Exam  Vitals reviewed. Constitutional: She is oriented to person, place, and time. She appears well-developed and well-nourished. No distress.  HENT:  Head: Normocephalic and atraumatic.  Mouth/Throat: Oropharynx is clear and moist. No oropharyngeal exudate.  Eyes: Conjunctivae are normal. Right eye exhibits no discharge. Left eye exhibits no discharge. No scleral icterus.  Neck: Normal range of motion. Neck supple. No JVD present. No tracheal deviation present. No thyromegaly present.  Cardiovascular: Normal rate, regular rhythm, normal heart sounds and intact distal pulses.  Exam reveals no gallop and no friction rub.   No murmur heard. Pulmonary/Chest: Effort normal and breath sounds normal. No stridor. No respiratory distress. She has no wheezes. She has no rales. She exhibits no tenderness.  Abdominal: Soft. Bowel sounds are normal. She exhibits no distension and no mass. There is no tenderness. There is no rebound and no guarding.  Musculoskeletal: Normal range of motion. She exhibits edema. She exhibits no tenderness.       Right lower leg: She exhibits swelling and edema. She exhibits no tenderness, no bony tenderness, no deformity and no laceration.       Left lower leg: She exhibits swelling and edema. She exhibits no tenderness, no bony tenderness, no deformity and no laceration.       Legs:  On her LE's B there is 1+ pitting edema with faint  erythema and small areas of brown brawny induration, there are no open wounds and no oozing  Lymphadenopathy:    She has no cervical adenopathy.  Neurological: She is alert and oriented to person, place, and time. She has normal strength. She displays no atrophy, no tremor and normal reflexes. No cranial nerve deficit or sensory deficit. She exhibits normal muscle tone. She displays a negative Romberg sign. She displays no seizure activity. Coordination and gait normal.  Reflex Scores:      Tricep reflexes are 1+ on the right side and 1+ on the left side.      Bicep reflexes are 1+ on the right side and 1+ on the left side.      Brachioradialis reflexes are 1+ on the right side and 1+ on the left side.      Patellar reflexes are 0 on the right side and 0 on the left side.      Achilles reflexes are 0 on the right side and 0 on the left side. Skin: Skin is warm and dry. No rash noted. She is not diaphoretic. There is erythema. No pallor.  Psychiatric: She has a normal mood and affect. Her behavior is normal. Judgment and thought content normal.     Lab Results  Component Value Date   WBC 11.8* 03/20/2012   HGB 13.3 03/20/2012   HCT 39.0 03/20/2012   PLT 379.0 03/20/2012   GLUCOSE 216* 03/20/2012   CHOL 233* 03/20/2012   TRIG 164.0* 03/20/2012   HDL 48.50 03/20/2012   LDLDIRECT 155.7 03/20/2012   LDLCALC  Value: 87        Total Cholesterol/HDL:CHD Risk Coronary Heart Disease Risk Table                     Men   Women  1/2 Average Risk   3.4   3.3  Average Risk       5.0   4.4  2 X Average Risk   9.6   7.1  3 X Average Risk  23.4   11.0        Use the calculated Patient Ratio above and the CHD Risk Table to determine the patient's CHD Risk.        ATP III CLASSIFICATION (LDL):  <100     mg/dL   Optimal  960-454  mg/dL   Near or Above                    Optimal  130-159  mg/dL   Borderline  098-119  mg/dL   High  >147     mg/dL   Very High 82/95/6213   ALT 20 03/20/2012   AST 24 03/20/2012   NA 134*  03/20/2012   K 3.9 03/20/2012   CL 94* 03/20/2012   CREATININE 1.3* 03/20/2012   BUN 25* 03/20/2012   CO2 29 03/20/2012   TSH 3.38 03/20/2012   HGBA1C 10.5* 03/20/2012       Assessment & Plan:

## 2012-03-20 NOTE — Patient Instructions (Signed)

## 2012-03-25 ENCOUNTER — Other Ambulatory Visit: Payer: Self-pay

## 2012-03-25 DIAGNOSIS — E1129 Type 2 diabetes mellitus with other diabetic kidney complication: Secondary | ICD-10-CM

## 2012-03-25 MED ORDER — FUROSEMIDE 80 MG PO TABS: 160.0000 mg | ORAL_TABLET | Freq: Two times a day (BID) | ORAL | Status: DC

## 2012-03-25 MED ORDER — METOPROLOL TARTRATE 25 MG PO TABS: 25.0000 mg | ORAL_TABLET | Freq: Every day | ORAL | Status: DC

## 2012-03-25 MED ORDER — INSULIN PEN NEEDLE 31G X 8 MM MISC: 1.0000 | Freq: Four times a day (QID) | Status: DC

## 2012-03-26 ENCOUNTER — Other Ambulatory Visit: Payer: Self-pay | Admitting: Internal Medicine

## 2012-03-26 DIAGNOSIS — E1129 Type 2 diabetes mellitus with other diabetic kidney complication: Secondary | ICD-10-CM

## 2012-03-26 MED ORDER — MICROLET LANCETS MISC: 1.0000 | Freq: Two times a day (BID) | Status: DC

## 2012-03-26 MED ORDER — GLUCOSE BLOOD VI STRP: ORAL_STRIP | Status: DC

## 2012-03-26 MED ORDER — METOPROLOL SUCCINATE ER 25 MG PO TB24: 25.0000 mg | ORAL_TABLET | Freq: Every day | ORAL | Status: DC

## 2012-04-18 ENCOUNTER — Encounter: Payer: Self-pay | Admitting: Dietician

## 2012-04-18 ENCOUNTER — Encounter: Payer: Medicare Other | Attending: Internal Medicine | Admitting: Dietician

## 2012-04-18 VITALS — Ht 62.0 in | Wt 218.8 lb

## 2012-04-18 DIAGNOSIS — E1129 Type 2 diabetes mellitus with other diabetic kidney complication: Secondary | ICD-10-CM

## 2012-04-18 DIAGNOSIS — Z713 Dietary counseling and surveillance: Secondary | ICD-10-CM | POA: Insufficient documentation

## 2012-04-18 DIAGNOSIS — E119 Type 2 diabetes mellitus without complications: Secondary | ICD-10-CM | POA: Insufficient documentation

## 2012-04-18 NOTE — Progress Notes (Signed)
Medical Nutrition Therapy:  Appt start time: 0915 end time:  1015.   Assessment:  Primary concerns today: Working to get blood glucose under better control.  Using the Phase One Diet.  Antifungal Diet by Heloise Purpura with Shon Baton, RN.  Not using grains, starches, or sugar in last 30 days.  Using Stevia sugar substitute.  Learned of the diet from a TV program.  Prior to starting the diet, she had daily diarrhea.  With the diet, she does not have this issue.  In the last 3 weeks, she has lost 2.2 lbs.  She has a history of DM for 7 years.  Currently, her A1C is at 10.5%.  She at times has pain in her knees, the tips of her toes and sometimes some numbness in her fingers.       BLOOD GLUCOSE: Fasting:100,115, 140's, 130's  The most recent numbers are lower. AC Lunch:145, 157, 135, 129, 135 AC Dinner:144, 157, 165 Bedtime: [phone],  MEDICATIONS: Med review completed.  Diabetes: Lantus, Novolog, Tradjenta   DIETARY INTAKE:  Usual eating pattern includes 2 meals and 2 snacks per day.  Everyday foods include vegetables, nuts, seeds, selected fruits.  Avoided foods include Grains, sugar, starches.    24-hr recall:  B ( AM): 10:30-11:00 AM Eggs 2, omlett, fried, or poached spinach, or ham or onion, cactus, chili peppers, sometimes avacado, cup warm water with lemon and chyanne pepper water then the V8 juice with Chia seeds. Snk ( AM): Nonr  L ( PM): 2:30-3:00 PM Tuna salad, 1 cup and 10 multigrain crackers, (no wheat, or soy, no corn, primarily seeds).  Every other day will have a bigh salad with grill chicken or ham or sliced [place].  Using olive oil and apple cider vinegar for salad dressing. Snk ( PM): 5:30 1/4 cup pumpkin seeds.  And carrot sticks. D ( PM): None Snk ( PM): 8:00 Cup of cottage cheese and 1 cup of berries, and 1 tabsp. Of sliced almonds and cup of cinnamon/apple spiced tea. Beverages: water with lemon, almond milk, 1-2 glasses low sodium V8 Juice, and herbal teas  (1-2 cups per day).  Usual physical activity: Currently not exercising.  But is willing to try to do walking in the water to condition herself for water aerobics classes in the future.  Recommend waling in the water to start at 20-30 minutes and work up to 150 minutes per week.  Estimated energy needs:Ht: 62 in  WT; 218.8 lb  BMI: 40.1 kg/m2  Adj WT: 145 lb 66 kg) 1300-1400 calories 145-150 g carbohydrates 95-100 g protein 35-37 g fat  Progress Towards Goal(s):  In progress.   Nutritional Diagnosis:  [place]-2.1 Inpaired nutrition utilization As related to glucose.  As evidenced by diagnosis and history of type 2 diabetes with A1C at 10.5%..    Intervention:  Nutrition Given her current recall and the limited carbohydrate and her enthusiasm for the diet and her success with the diet, I agreed that she could continue the the very basic, almost primitive dietary approach.  She notes that many times she has tried to gain control of her glucose levels only to fail.  She is adamant that she will work hard and wants support.  We reviewed reading food labels, counting carbohydrates and the use of the exchange list in assisting with carb counting.  Handouts given during visit include:  Living Well with Diabetes  Controlling Blood Glucose  Label with recommendations for glucose control   Monitoring/Evaluation:  Dietary  intake, exercise, blood glucose levels, and body weight in 8-12 weeks, to call for an appointment.Marland Kitchen

## 2012-04-26 ENCOUNTER — Telehealth: Payer: Self-pay | Admitting: *Deleted

## 2012-04-26 NOTE — Telephone Encounter (Signed)
Pt presented at [facility] requesting help to get her glucose test strips.  She stated pharmacy needed billing code for insurance.  Called Samia at [facility] and they need DX code.  This is given--250.40 per problem list.  They will process.

## 2012-04-28 ENCOUNTER — Encounter: Payer: Self-pay | Admitting: Dietician

## 2012-04-30 ENCOUNTER — Telehealth: Payer: Self-pay | Admitting: Internal Medicine

## 2012-04-30 DIAGNOSIS — E1129 Type 2 diabetes mellitus with other diabetic kidney complication: Secondary | ICD-10-CM

## 2012-04-30 MED ORDER — GLUCOSE BLOOD VI STRP: ORAL_STRIP | Status: DC

## 2012-04-30 NOTE — Telephone Encounter (Signed)
Rx printed as requested.

## 2012-04-30 NOTE — Telephone Encounter (Signed)
Caller: Cop[place]/Patient; Patient Name: Franky Macho; PCP: Sanda Linger (Adults only); Best Callback Phone Number: [phone].  Patient dropped off prescription to pharmacy on 04/23/12 for contour Net test strips.  MD did not put a diagonostic code on the prescription.  Pharmacy has contacted office to request information and then patent came by office on 04/26/12 and talked with office and they were supposed to let MD know.  Pharmacy is [facility] [phone].  Triager pulled chart and see note from Francee Piccolo, [facility] 04/26/12 at 11:56 saying she called [facility] and spoke with [place] and gave them diagnosis code 250.40.  Triager contacted [facility] at [facility] and spoke with pharmacist to see why strips have not been processed.  Was advised that they can't take a verbal, they need new script in writing with MD signature and diagnosis code and how many times per day patient is to test blood sugar.  OFFICE PLEASE SEND NEW PRESCRIPTION TO [facility] WITH ALL INFORMATION INCLUDED.  PATIENT IS NOT ABLE TO CHECK HER BLOOD SUGAR WITHOUT THESE STRIPS.  NEEDS THIS DONE ASAP.  PLEASE CALL PATIENT BACK AT [phone].

## 2012-05-01 ENCOUNTER — Telehealth: Payer: Self-pay | Admitting: *Deleted

## 2012-05-01 MED ORDER — MICROLET LANCETS MISC: 1.0000 | Freq: Three times a day (TID) | Status: DC

## 2012-05-01 NOTE — Telephone Encounter (Signed)
RX faxed to [facility] [phone]

## 2012-05-01 NOTE — Telephone Encounter (Signed)
Pt lleft msg on triage very upset she states been trying to ger refill on her strips & lancets for 1 week. Requesting someone to return call back. Called pt back she states pharmacy states md hasn't sent prescription. Inform pt rx for strips was sent yesterday on 04/30/12. Told pt will call pharmacy & i will give her a call back once i see what is going on, Called [facility] spoke with Gaspar Garbe he states did received rx for strips but not lancets. Sent rx for lancet with dx code. Called pt bck inform her what was happening and we sent rx for lancets...Raechel Chute

## 2012-05-26 ENCOUNTER — Ambulatory Visit (INDEPENDENT_AMBULATORY_CARE_PROVIDER_SITE_OTHER): Payer: Medicare Other | Admitting: Internal Medicine

## 2012-05-26 ENCOUNTER — Encounter: Payer: Self-pay | Admitting: Internal Medicine

## 2012-05-26 ENCOUNTER — Other Ambulatory Visit (INDEPENDENT_AMBULATORY_CARE_PROVIDER_SITE_OTHER): Payer: Medicare Other

## 2012-05-26 VITALS — BP 112/70 | HR 62 | Temp 98.7°F | Resp 16 | Wt 212.5 lb

## 2012-05-26 DIAGNOSIS — Z79899 Other long term (current) drug therapy: Secondary | ICD-10-CM

## 2012-05-26 DIAGNOSIS — E1140 Type 2 diabetes mellitus with diabetic neuropathy, unspecified: Secondary | ICD-10-CM | POA: Insufficient documentation

## 2012-05-26 DIAGNOSIS — E1142 Type 2 diabetes mellitus with diabetic polyneuropathy: Secondary | ICD-10-CM

## 2012-05-26 DIAGNOSIS — E1129 Type 2 diabetes mellitus with other diabetic kidney complication: Secondary | ICD-10-CM

## 2012-05-26 DIAGNOSIS — E1149 Type 2 diabetes mellitus with other diabetic neurological complication: Secondary | ICD-10-CM

## 2012-05-26 DIAGNOSIS — N058 Unspecified nephritic syndrome with other morphologic changes: Secondary | ICD-10-CM

## 2012-05-26 LAB — HEMOGLOBIN A1C: Hgb A1c MFr Bld: 7.3 % — ABNORMAL HIGH (ref 4.6–6.5)

## 2012-05-26 LAB — BASIC METABOLIC PANEL
Calcium: 9.4 mg/dL (ref 8.4–10.5)
Creatinine, Ser: 1 mg/dL (ref 0.4–1.2)
GFR: 58.61 mL/min — ABNORMAL LOW (ref >60.00)

## 2012-05-26 MED ORDER — METANX 3-90.314-2-35 MG PO CAPS: 1.0000 | ORAL_CAPSULE | Freq: Two times a day (BID) | ORAL | Status: DC

## 2012-05-26 NOTE — Assessment & Plan Note (Signed)
She has tried multiple agents with no relief, I have asked her to try metanx

## 2012-05-26 NOTE — Patient Instructions (Signed)

## 2012-05-26 NOTE — Progress Notes (Signed)
Subjective:    Patient ID: Jade [name], female    DOB: [birth year], 56 y.o.   MRN: [id number]  Diabetes She presents for her follow-up diabetic visit. She has type 2 diabetes mellitus. Her disease course has been improving. There are no hypoglycemic associated symptoms. Pertinent negatives for hypoglycemia include no dizziness, headaches, seizures, speech difficulty or tremors. Associated symptoms include foot paresthesias. Pertinent negatives for diabetes include no blurred vision, no chest pain, no fatigue, no foot ulcerations, no polydipsia, no polyphagia, no polyuria, no visual change, no weakness and no weight loss. There are no hypoglycemic complications. Symptoms are improving. Diabetic complications include nephropathy and peripheral neuropathy. Current diabetic treatment includes intensive insulin program and insulin injections. She is compliant with treatment most of the time. Her weight is stable. She is following a generally healthy diet. Meal planning includes avoidance of concentrated sweets. She has not had a previous visit with a dietician. She participates in exercise intermittently. Her home blood glucose trend is decreasing steadily. Her breakfast blood glucose range is generally 110-130 mg/dl. Her lunch blood glucose range is generally 130-140 mg/dl. Her dinner blood glucose range is generally 130-140 mg/dl. Her highest blood glucose is 130-140 mg/dl. Her overall blood glucose range is 130-140 mg/dl. An ACE inhibitor/angiotensin II receptor blocker is not being taken. She does not see a podiatrist.Eye exam is current.      Review of Systems  Constitutional: Negative for fever, chills, weight loss, diaphoresis, activity change, appetite change, fatigue and unexpected weight change.  HENT: Negative.   Eyes: Negative.  Negative for blurred vision.  Respiratory: Negative for cough, chest tightness, shortness of breath, wheezing and stridor.   Cardiovascular: Negative for chest pain,  palpitations and leg swelling.  Gastrointestinal: Negative.   Genitourinary: Negative.  Negative for polyuria.  Musculoskeletal: Negative.   Skin: Negative.   Neurological: Positive for numbness (burning, tingling in her feet). Negative for dizziness, tremors, seizures, syncope, facial asymmetry, speech difficulty, weakness, light-headedness and headaches.  Hematological: Negative for polydipsia, polyphagia and adenopathy. Does not bruise/bleed easily.  Psychiatric/Behavioral: Negative.        Objective:   Physical Exam  Vitals reviewed. Constitutional: She is oriented to person, place, and time. She appears well-developed and well-nourished. No distress.  HENT:  Head: Normocephalic and atraumatic.  Mouth/Throat: Oropharynx is clear and moist. No oropharyngeal exudate.  Eyes: Conjunctivae normal are normal. Right eye exhibits no discharge. Left eye exhibits no discharge. No scleral icterus.  Neck: Normal range of motion. Neck supple. No JVD present. No tracheal deviation present. No thyromegaly present.  Cardiovascular: Normal rate, regular rhythm, normal heart sounds and intact distal pulses.  Exam reveals no gallop and no friction rub.   No murmur heard. Pulmonary/Chest: Effort normal and breath sounds normal. No stridor. No respiratory distress. She has no wheezes. She has no rales. She exhibits no tenderness.  Abdominal: Soft. Bowel sounds are normal. She exhibits no distension and no mass. There is no tenderness. There is no rebound and no guarding.  Musculoskeletal: Normal range of motion. She exhibits edema (trace edema in BLE). She exhibits no tenderness.  Lymphadenopathy:    She has no cervical adenopathy.  Neurological: She is oriented to person, place, and time.  Skin: Skin is warm and dry. No rash noted. She is not diaphoretic. No erythema. No pallor.  Psychiatric: She has a normal mood and affect. Her behavior is normal. Judgment and thought content normal.      Lab  Results  Component Value Date  WBC 11.8* 03/20/2012   HGB 13.3 03/20/2012   HCT 39.0 03/20/2012   PLT 379.0 03/20/2012   GLUCOSE 216* 03/20/2012   CHOL 233* 03/20/2012   TRIG 164.0* 03/20/2012   HDL 48.50 03/20/2012   LDLDIRECT 155.7 03/20/2012   LDLCALC  Value: 87        Total Cholesterol/HDL:CHD Risk Coronary Heart Disease Risk Table                     Men   Women  1/2 Average Risk   3.4   3.3  Average Risk       5.0   4.4  2 X Average Risk   9.6   7.1  3 X Average Risk  23.4   11.0        Use the calculated Patient Ratio above and the CHD Risk Table to determine the patient's CHD Risk.        ATP III CLASSIFICATION (LDL):  <100     mg/dL   Optimal  469-629  mg/dL   Near or Above                    Optimal  130-159  mg/dL   Borderline  528-413  mg/dL   High  >244     mg/dL   Very High 08/29/7251   ALT 20 03/20/2012   AST 24 03/20/2012   NA 134* 03/20/2012   K 3.9 03/20/2012   CL 94* 03/20/2012   CREATININE 1.3* 03/20/2012   BUN 25* 03/20/2012   CO2 29 03/20/2012   TSH 3.38 03/20/2012   HGBA1C 10.5* 03/20/2012      Assessment & Plan:

## 2012-05-26 NOTE — Assessment & Plan Note (Signed)
I will check her a1c today to see if her blood sugars are well controlled

## 2012-05-27 ENCOUNTER — Encounter: Payer: Self-pay | Admitting: Internal Medicine

## 2012-05-27 LAB — C-PEPTIDE: C-Peptide: 3.35 ng/mL (ref 0.80–3.90)

## 2012-07-28 ENCOUNTER — Other Ambulatory Visit: Payer: Self-pay

## 2012-07-28 MED ORDER — POTASSIUM CHLORIDE CRYS ER 20 MEQ PO TBCR: EXTENDED_RELEASE_TABLET | ORAL | Status: DC

## 2012-07-28 NOTE — Telephone Encounter (Signed)
Received faxed refill request for potassium 2tab TID

## 2012-08-11 ENCOUNTER — Ambulatory Visit: Payer: Medicare Other | Admitting: Internal Medicine

## 2012-08-28 ENCOUNTER — Other Ambulatory Visit: Payer: Self-pay

## 2012-08-28 MED ORDER — POTASSIUM CHLORIDE CRYS ER 20 MEQ PO TBCR: EXTENDED_RELEASE_TABLET | ORAL | Status: DC

## 2012-09-24 ENCOUNTER — Ambulatory Visit: Payer: Medicare Other | Admitting: Internal Medicine

## 2012-09-26 ENCOUNTER — Encounter: Payer: Self-pay | Admitting: Internal Medicine

## 2012-09-26 ENCOUNTER — Ambulatory Visit (INDEPENDENT_AMBULATORY_CARE_PROVIDER_SITE_OTHER): Payer: Medicare Other | Admitting: Internal Medicine

## 2012-09-26 ENCOUNTER — Ambulatory Visit (INDEPENDENT_AMBULATORY_CARE_PROVIDER_SITE_OTHER)
Admission: RE | Admit: 2012-09-26 | Discharge: 2012-09-26 | Disposition: A | Discharge status: Home / Self Care | Payer: Medicare Other | Source: Ambulatory Visit | Attending: Internal Medicine | Admitting: Internal Medicine

## 2012-09-26 ENCOUNTER — Other Ambulatory Visit (INDEPENDENT_AMBULATORY_CARE_PROVIDER_SITE_OTHER): Payer: Medicare Other

## 2012-09-26 VITALS — BP 120/64 | HR 71 | Temp 98.6°F | Resp 16 | Wt 198.0 lb

## 2012-09-26 DIAGNOSIS — D72829 Elevated white blood cell count, unspecified: Secondary | ICD-10-CM

## 2012-09-26 DIAGNOSIS — E1129 Type 2 diabetes mellitus with other diabetic kidney complication: Secondary | ICD-10-CM

## 2012-09-26 DIAGNOSIS — M899 Disorder of bone, unspecified: Secondary | ICD-10-CM

## 2012-09-26 DIAGNOSIS — E7800 Pure hypercholesterolemia, unspecified: Secondary | ICD-10-CM

## 2012-09-26 DIAGNOSIS — R10811 Right upper quadrant abdominal tenderness: Secondary | ICD-10-CM

## 2012-09-26 DIAGNOSIS — Z1231 Encounter for screening mammogram for malignant neoplasm of breast: Secondary | ICD-10-CM

## 2012-09-26 DIAGNOSIS — M8580 Other specified disorders of bone density and structure, unspecified site: Secondary | ICD-10-CM

## 2012-09-26 DIAGNOSIS — M949 Disorder of cartilage, unspecified: Secondary | ICD-10-CM

## 2012-09-26 LAB — URINALYSIS, ROUTINE W REFLEX MICROSCOPIC
Ketones, ur: NEGATIVE
Specific Gravity, Urine: 1.01 (ref 1.000–1.030)
Total Protein, Urine: NEGATIVE
Urine Glucose: NEGATIVE

## 2012-09-26 LAB — COMPREHENSIVE METABOLIC PANEL
ALT: 20 U/L (ref 0–35)
AST: 21 U/L (ref 0–37)
BUN: 27 mg/dL — ABNORMAL HIGH (ref 6–23)
CO2: 29 mEq/L (ref 19–32)
Calcium: 9.4 mg/dL (ref 8.4–10.5)
Chloride: 100 mEq/L (ref 96–112)
Creatinine, Ser: 1.1 mg/dL (ref 0.4–1.2)
GFR: 53.7 mL/min — ABNORMAL LOW (ref >60.00)
Total Bilirubin: 0.5 mg/dL (ref 0.3–1.2)

## 2012-09-26 LAB — CBC WITH DIFFERENTIAL/PLATELET
Hemoglobin: 13.6 g/dL (ref 12.0–15.0)
Lymphocytes Relative: 28.4 % (ref 12.0–46.0)
Lymphs Abs: 2.7 10*3/uL (ref 0.7–4.0)
MCHC: 34.2 g/dL (ref 30.0–36.0)
MCV: 93 fl (ref 78.0–100.0)
RDW: 13.7 % (ref 11.5–14.6)

## 2012-09-26 LAB — LIPID PANEL
Cholesterol: 209 mg/dL — ABNORMAL HIGH (ref 0–200)
HDL: 37.6 mg/dL — ABNORMAL LOW (ref >39.00)
Triglycerides: 176 mg/dL — ABNORMAL HIGH (ref 0.0–149.0)

## 2012-09-26 LAB — LIPASE: Lipase: 38 U/L (ref 11.0–59.0)

## 2012-09-26 LAB — CORTISOL: Cortisol, Plasma: 6.5 ug/dL

## 2012-09-26 NOTE — Assessment & Plan Note (Signed)
I will check her plain films today to look for stones, stool burden She will do labs today to look for hepatitis, pancreatitis, uti, hematuria

## 2012-09-26 NOTE — Assessment & Plan Note (Signed)
Check her FLP today 

## 2012-09-26 NOTE — Progress Notes (Signed)
Subjective:    Patient ID: Jade [name], female    DOB: February 09, 1956, 57 y.o.   MRN: [id number]  Abdominal Pain This is a recurrent problem. The current episode started more than 1 month ago. The onset quality is gradual. The problem occurs intermittently. The problem has been unchanged. The pain is located in the RUQ. The pain is at a severity of 2/10. The pain is mild. The quality of the pain is aching and cramping. The abdominal pain radiates to the epigastric region and right flank. Associated symptoms include constipation and weight loss. Pertinent negatives include no anorexia, arthralgias, belching, diarrhea, dysuria, fever, flatus, frequency, headaches, hematochezia, hematuria, melena, myalgias, nausea or vomiting. Nothing aggravates the pain. The pain is relieved by nothing. She has tried nothing for the symptoms. Her past medical history is significant for abdominal surgery.      Review of Systems  Constitutional: Positive for weight loss, fatigue and unexpected weight change. Negative for fever, chills, diaphoresis, activity change and appetite change.  HENT: Negative.   Eyes: Negative.   Respiratory: Negative for apnea, shortness of breath, wheezing and stridor.   Cardiovascular: Negative for chest pain, palpitations and leg swelling.  Gastrointestinal: Positive for abdominal pain and constipation. Negative for nausea, vomiting, diarrhea, blood in stool, melena, hematochezia, abdominal distention, anal bleeding, rectal pain, anorexia and flatus.  Genitourinary: Positive for flank pain. Negative for dysuria, urgency, frequency, hematuria, decreased urine volume, vaginal bleeding, enuresis, difficulty urinating and dyspareunia.  Musculoskeletal: Negative for myalgias, back pain, joint swelling, arthralgias and gait problem.  Skin: Negative for color change, pallor, rash and wound.  Neurological: Positive for dizziness and light-headedness. Negative for tremors, seizures, syncope,  facial asymmetry, speech difficulty, weakness, numbness and headaches.  Hematological: Negative for adenopathy. Does not bruise/bleed easily.  Psychiatric/Behavioral: Negative.        Objective:   Physical Exam  Vitals reviewed. Constitutional: She is oriented to person, place, and time. She appears well-developed and well-nourished.  Non-toxic appearance. She does not have a sickly appearance. She does not appear ill. No distress.  HENT:  Head: Normocephalic and atraumatic.  Mouth/Throat: Oropharynx is clear and moist. No oropharyngeal exudate.  Eyes: Conjunctivae normal are normal. Right eye exhibits no discharge. Left eye exhibits no discharge. No scleral icterus.  Neck: Normal range of motion. Neck supple. No JVD present. No tracheal deviation present. No thyromegaly present.  Cardiovascular: Normal rate, regular rhythm, normal heart sounds and intact distal pulses.  Exam reveals no gallop and no friction rub.   No murmur heard. Pulmonary/Chest: Effort normal and breath sounds normal. No stridor. No respiratory distress. She has no wheezes. She has no rales. She exhibits no tenderness.  Abdominal: Soft. Normal appearance and bowel sounds are normal. She exhibits no shifting dullness, no distension, no pulsatile liver, no fluid wave, no abdominal bruit, no ascites, no pulsatile midline mass and no mass. There is no hepatosplenomegaly, splenomegaly or hepatomegaly. There is tenderness in the right upper quadrant. There is no rigidity, no rebound, no guarding, no CVA tenderness, no tenderness at McBurney's point and negative Murphy's sign. No hernia. Hernia confirmed negative in the ventral area, confirmed negative in the right inguinal area and confirmed negative in the left inguinal area.  Musculoskeletal: Normal range of motion. She exhibits no edema and no tenderness.       Right lower leg: She exhibits edema (trace) and deformity (mild diffuse erythema and induration). She exhibits no  tenderness, no bony tenderness, no swelling and no laceration.  Left lower leg: She exhibits edema (trace) and deformity (mild diffuse erythema and induration). She exhibits no tenderness, no bony tenderness, no swelling and no laceration.  Lymphadenopathy:    She has no cervical adenopathy.  Neurological: She is oriented to person, place, and time.  Skin: Skin is warm and dry. No rash noted. She is not diaphoretic. No erythema. No pallor.  Psychiatric: She has a normal mood and affect. Her behavior is normal. Judgment and thought content normal.      Lab Results  Component Value Date   WBC 11.8* 03/20/2012   HGB 13.3 03/20/2012   HCT 39.0 03/20/2012   PLT 379.0 03/20/2012   GLUCOSE 152* 05/26/2012   CHOL 233* 03/20/2012   TRIG 164.0* 03/20/2012   HDL 48.50 03/20/2012   LDLDIRECT 155.7 03/20/2012   LDLCALC  Value: 87        Total Cholesterol/HDL:CHD Risk Coronary Heart Disease Risk Table                     Men   Women  1/2 Average Risk   3.4   3.3  Average Risk       5.0   4.4  2 X Average Risk   9.6   7.1  3 X Average Risk  23.4   11.0        Use the calculated Patient Ratio above and the CHD Risk Table to determine the patient's CHD Risk.        ATP III CLASSIFICATION (LDL):  <100     mg/dL   Optimal  161-096  mg/dL   Near or Above                    Optimal  130-159  mg/dL   Borderline  045-409  mg/dL   High  >811     mg/dL   Very High 91/47/8295   ALT 20 03/20/2012   AST 24 03/20/2012   NA 138 05/26/2012   K 3.3* 05/26/2012   CL 100 05/26/2012   CREATININE 1.0 05/26/2012   BUN 23 05/26/2012   CO2 27 05/26/2012   TSH 3.38 03/20/2012   HGBA1C 7.3* 05/26/2012      Assessment & Plan:

## 2012-09-26 NOTE — Assessment & Plan Note (Signed)
She needs to get a DEXA scan done I will check her labs today including a Vit D level

## 2012-09-26 NOTE — Patient Instructions (Signed)
Abdominal Pain  Abdominal pain can be caused by many things. Your caregiver decides the seriousness of your pain by an examination and possibly blood tests and X-rays. Many cases can be observed and treated at home. Most abdominal pain is not caused by a disease and will probably improve without treatment. However, in many cases, more time must pass before a clear cause of the pain can be found. Before that point, it may not be known if you need more testing, or if hospitalization or surgery is needed.  HOME CARE INSTRUCTIONS   · Do not take laxatives unless directed by your caregiver.  · Take pain medicine only as directed by your caregiver.  · Only take over-the-counter or prescription medicines for pain, discomfort, or fever as directed by your caregiver.  · Try a clear liquid diet (broth, tea, or water) for as long as directed by your caregiver. Slowly move to a bland diet as tolerated.  SEEK IMMEDIATE MEDICAL CARE IF:   · The pain does not go away.  · You have a fever.  · You keep throwing up (vomiting).  · The pain is felt only in portions of the abdomen. Pain in the right side could possibly be appendicitis. In an adult, pain in the left lower portion of the abdomen could be colitis or diverticulitis.  · You pass bloody or black tarry stools.  MAKE SURE YOU:   · Understand these instructions.  · Will watch your condition.  · Will get help right away if you are not doing well or get worse.  Document Released: 05/23/2005 Document Revised: 11/05/2011 Document Reviewed: 03/31/2008  [employer]® Patient Information ©2013 [employer].

## 2012-09-26 NOTE — Assessment & Plan Note (Signed)
I will recheck her CBC today 

## 2012-09-29 ENCOUNTER — Encounter: Payer: Self-pay | Admitting: Internal Medicine

## 2012-09-29 LAB — VITAMIN D 1,25 DIHYDROXY
Vitamin D 1, 25 (OH)2 Total: 72 pg/mL (ref 18–72)
Vitamin D2 1, 25 (OH)2: <8 pg/mL
Vitamin D3 1, 25 (OH)2: 72 pg/mL

## 2012-10-24 ENCOUNTER — Ambulatory Visit (INDEPENDENT_AMBULATORY_CARE_PROVIDER_SITE_OTHER): Payer: Medicare Other | Admitting: Internal Medicine

## 2012-10-24 ENCOUNTER — Encounter: Payer: Self-pay | Admitting: Internal Medicine

## 2012-10-24 VITALS — BP 110/64 | HR 64 | Temp 97.7°F | Resp 16 | Wt 194.0 lb

## 2012-10-24 DIAGNOSIS — M8580 Other specified disorders of bone density and structure, unspecified site: Secondary | ICD-10-CM

## 2012-10-24 DIAGNOSIS — R10811 Right upper quadrant abdominal tenderness: Secondary | ICD-10-CM

## 2012-10-24 DIAGNOSIS — M899 Disorder of bone, unspecified: Secondary | ICD-10-CM

## 2012-10-24 DIAGNOSIS — Z87898 Personal history of other specified conditions: Secondary | ICD-10-CM | POA: Insufficient documentation

## 2012-10-24 DIAGNOSIS — Z8742 Personal history of other diseases of the female genital tract: Secondary | ICD-10-CM

## 2012-10-24 DIAGNOSIS — E1129 Type 2 diabetes mellitus with other diabetic kidney complication: Secondary | ICD-10-CM

## 2012-10-24 DIAGNOSIS — M949 Disorder of cartilage, unspecified: Secondary | ICD-10-CM

## 2012-10-24 NOTE — Patient Instructions (Signed)
Osteoporosis  Throughout your life, your body breaks down old bone and replaces it with new bone. As you get older, your body does not replace bone as quickly as it breaks it down. By the age of 30 years, most people begin to gradually lose bone because of the imbalance between bone loss and replacement. Some people lose more bone than others. Bone loss beyond a specified normal degree is considered osteoporosis.    Osteoporosis affects the strength and durability of your bones. The inside of the ends of your bones and your flat bones, like the bones of your pelvis, look like honeycomb, filled with tiny open spaces. As bone loss occurs, your bones become less dense. This means that the open spaces inside your bones become bigger and the walls between these spaces become thinner. This makes your bones weaker. Bones of a person with osteoporosis can become so weak that they can break (fracture) during minor accidents, such as a simple fall.  CAUSES    The following factors have been associated with the development of osteoporosis:   Smoking.   Drinking more than 2 alcoholic drinks several days per week.   Long-term use of certain medicines:   Corticosteroids.   Chemotherapy medicines.   Thyroid medicines.   Antiepileptic medicines.   Gonadal hormone suppression medicine.   Immunosuppression medicine.   Being underweight.   Lack of physical activity.   Lack of exposure to the sun. This can lead to vitamin D deficiency.   Certain medical conditions:   Certain inflammatory bowel diseases, such as Crohn's disease and ulcerative colitis.   Diabetes.   Hyperthyroidism.   Hyperparathyroidism.  RISK FACTORS  Anyone can develop osteoporosis. However, the following factors can increase your risk of developing osteoporosis:   Gender Women are at higher risk than men.   Age Being older than 50 years increases your risk.   Ethnicity White and Asian people have an increased risk.    Weight Being extremely underweight can increase your risk of osteoporosis.   Family history of osteoporosis Having a family member who has developed osteoporosis can increase your risk.  SYMPTOMS    Usually, people with osteoporosis have no symptoms.    DIAGNOSIS    Signs during a physical exam that may prompt your caregiver to suspect osteoporosis include:   Decreased height. This is usually caused by the compression of the bones that form your spine (vertebrae) because they have weakened and become fractured.   A curving or rounding of the upper back (kyphosis).  To confirm signs of osteoporosis, your caregiver may request a procedure that uses 2 low-dose X-ray beams with different levels of energy to measure your bone mineral density (dual-energy X-ray absorptiometry [DXA]). Also, your caregiver may check your level of vitamin D.  TREATMENT    The goal of osteoporosis treatment is to strengthen bones in order to decrease the risk of bone fractures. There are different types of medicines available to help achieve this goal. Some of these medicines work by slowing the processes of bone loss. Some medicines work by increasing bone density. Treatment also involves making sure that your levels of calcium and vitamin D are adequate.  PREVENTION    There are things you can do to help prevent osteoporosis. Adequate intake of calcium and vitamin D can help you achieve optimal bone mineral density. Regular exercise can also help, especially resistance and high-impact activities. If you smoke, quitting smoking is an important part of osteoporosis prevention.  MAKE 

## 2012-10-24 NOTE — Progress Notes (Signed)
Subjective:    Patient ID: Jade [name], female    DOB: 03-Jan-1956, 57 y.o.   MRN: [id number]  HPI  She came in today to discuss her recent DEXA scan (-2.4). She has decided she does not want to take a medication for this.  Review of Systems  Constitutional: Negative for fever, chills, diaphoresis, activity change, appetite change, fatigue and unexpected weight change.  HENT: Negative.   Eyes: Negative.   Respiratory: Negative for cough, chest tightness, shortness of breath, wheezing and stridor.   Cardiovascular: Negative.   Gastrointestinal: Negative for nausea, vomiting, abdominal pain, diarrhea, constipation and abdominal distention.  Endocrine: Negative.   Genitourinary: Negative.   Musculoskeletal: Negative.   Skin: Negative.   Allergic/Immunologic: Negative.   Neurological: Positive for numbness (both feet).  Hematological: Negative for adenopathy. Does not bruise/bleed easily.  Psychiatric/Behavioral: Negative.        Objective:   Physical Exam  Vitals reviewed. Constitutional: She is oriented to person, place, and time. She appears well-developed and well-nourished. No distress.  HENT:  Head: Normocephalic and atraumatic.  Mouth/Throat: Oropharynx is clear and moist. No oropharyngeal exudate.  Eyes: Conjunctivae are normal. Right eye exhibits no discharge. Left eye exhibits no discharge. No scleral icterus.  Neck: Normal range of motion. Neck supple. No JVD present. No tracheal deviation present. No thyromegaly present.  Cardiovascular: Normal rate, regular rhythm, normal heart sounds and intact distal pulses.  Exam reveals no gallop and no friction rub.   No murmur heard. Pulmonary/Chest: Effort normal and breath sounds normal. No stridor. No respiratory distress. She has no wheezes. She has no rales. She exhibits no tenderness.  Abdominal: Soft. Bowel sounds are normal. She exhibits no distension and no mass. There is no tenderness. There is no rebound and no  guarding.  Musculoskeletal: Normal range of motion. She exhibits no edema and no tenderness.  Lymphadenopathy:    She has no cervical adenopathy.  Neurological: She is oriented to person, place, and time.  Skin: Skin is warm and dry. No rash noted. She is not diaphoretic. No erythema. No pallor.  Psychiatric: She has a normal mood and affect. Her behavior is normal. Judgment and thought content normal.     Lab Results  Component Value Date   WBC 9.5 09/26/2012   HGB 13.6 09/26/2012   HCT 39.7 09/26/2012   PLT 356.0 09/26/2012   GLUCOSE 148* 09/26/2012   CHOL 209* 09/26/2012   TRIG 176.0* 09/26/2012   HDL 37.60* 09/26/2012   LDLDIRECT 136.4 09/26/2012   LDLCALC  Value: 87        Total Cholesterol/HDL:CHD Risk Coronary Heart Disease Risk Table                     Men   Women  1/2 Average Risk   3.4   3.3  Average Risk       5.0   4.4  2 X Average Risk   9.6   7.1  3 X Average Risk  23.4   11.0        Use the calculated Patient Ratio above and the CHD Risk Table to determine the patient's CHD Risk.        ATP III CLASSIFICATION (LDL):  <100     mg/dL   Optimal  829-562  mg/dL   Near or Above                    Optimal  130-159  mg/dL   Borderline  160-189  mg/dL   High  >161     mg/dL   Very High 09/60/4540   ALT 20 09/26/2012   AST 21 09/26/2012   NA 138 09/26/2012   K 3.7 09/26/2012   CL 100 09/26/2012   CREATININE 1.1 09/26/2012   BUN 27* 09/26/2012   CO2 29 09/26/2012   TSH 3.38 03/20/2012   HGBA1C 6.0 09/26/2012        Assessment & Plan:

## 2012-10-25 ENCOUNTER — Encounter: Payer: Self-pay | Admitting: Internal Medicine

## 2012-10-25 NOTE — Assessment & Plan Note (Signed)
She wants to see a GYN

## 2012-10-25 NOTE — Assessment & Plan Note (Signed)
No pain noted today  

## 2012-10-25 NOTE — Assessment & Plan Note (Signed)
She has decided not to treat this

## 2012-10-25 NOTE — Assessment & Plan Note (Signed)
She has good control of her blood sugars

## 2012-10-29 ENCOUNTER — Encounter: Payer: Self-pay | Admitting: Obstetrics & Gynecology

## 2012-11-14 ENCOUNTER — Ambulatory Visit (INDEPENDENT_AMBULATORY_CARE_PROVIDER_SITE_OTHER): Payer: Medicare Other | Admitting: Obstetrics & Gynecology

## 2012-11-14 ENCOUNTER — Other Ambulatory Visit (HOSPITAL_COMMUNITY)
Admission: RE | Admit: 2012-11-14 | Discharge: 2012-11-14 | Disposition: A | Discharge status: Home / Self Care | Payer: Medicare Other | Source: Ambulatory Visit | Attending: Obstetrics & Gynecology | Admitting: Obstetrics & Gynecology

## 2012-11-14 ENCOUNTER — Encounter: Payer: Self-pay | Admitting: Obstetrics & Gynecology

## 2012-11-14 VITALS — BP 121/73 | HR 78 | Ht 62.0 in | Wt 194.1 lb

## 2012-11-14 DIAGNOSIS — Z1151 Encounter for screening for human papillomavirus (HPV): Secondary | ICD-10-CM | POA: Insufficient documentation

## 2012-11-14 DIAGNOSIS — Z124 Encounter for screening for malignant neoplasm of cervix: Secondary | ICD-10-CM | POA: Insufficient documentation

## 2012-11-14 DIAGNOSIS — Z01419 Encounter for gynecological examination (general) (routine) without abnormal findings: Secondary | ICD-10-CM

## 2012-11-14 DIAGNOSIS — Z1231 Encounter for screening mammogram for malignant neoplasm of breast: Secondary | ICD-10-CM

## 2012-11-14 DIAGNOSIS — Z87898 Personal history of other specified conditions: Secondary | ICD-10-CM

## 2012-11-14 NOTE — Patient Instructions (Addendum)
Mammogram Tips Healthy women should begin getting mammograms every year or two once they reach age 58, and once a year when they reach age 41. Here are tips:  Find an experienced, high-volume center with accomplished radiologists. You can ask for their credentials.  Ask to see the certificate showing the center is approved by the [place].S[place] Food and Drug Administration.  Use the same center regularly, so it is easier to compare your new mammograms with your old ones.  Bring a list of places you have had mammograms, dates, biopsies or other breast treatments. Bring old mammograms with you or have them sent to your primary caregiver.  Describe any breast problems to your caregiver or the person doing the mammogram. Be ready to give past surgeries, birth control pills, hormone use, breast implants, growths, moles, breast scars and family or personal history of breast cancer.  Call your doctor or center to check on the mammogram if you hear nothing within 10 days. Do not assume everything was normal.  To protect your privacy, the mammogram results cannot be given over the phone or to anyone but you.  Radiation from a mammogram is very low and does not pose a radiation risk.  Mammograms can detect breast problems other than breast cancer.  You may be asked stand or sit in front of the X-ray machine.  Two small plastic or glass plates are placed around the breast when taking the X-ray.  If you are menstruating, schedule your mammogram a week after your menstrual period.  Do not wear deodorants, powder or perfume when getting a mammogram.  Wash your breasts and under your arms before getting a mammogram.  Wear cloths that are easy for you to undress and dress.  Arrive at the center at least 15 minutes before the mammogram is scheduled.  There may be slight discomfort during the mammogram, but it goes away shortly after the test.  Try to relax as much as possible during the mammogram.  Talk  to your caregiver if you do not understand the results of the mammogram.  Follow the recommendations of your caregiver regarding further tests and treatments if needed.  Get a second opinion if you are concerned or question the results of the mammogram, further tests or treatment if needed.  Continue with monthly self-breast exams and yearly caregiver exams even if the mammogram is normal.  Your caregiver may recommend getting a mammogram before age 44 and more often if you are at high risk for developing breast cancer. Document Released: 11/29/2005 Document Revised: 11/05/2011 Document Reviewed: 08/06/2008 [facility] Patient Information 2013 [place], [place]. Pap Test A Pap test is a procedure done in a clinic office to evaluate cells that are on the surface of the cervix. The cervix is the lower portion of the uterus and upper portion of the vagina. For some women, the cervical region has the potential to form cancer. With consistent evaluations by your caregiver, this type of cancer can be prevented.  If a Pap test is abnormal, it is most often a result of a previous exposure to human papillomavirus (HPV). HPV is a virus that can infect the cells of the cervix and cause dysplasia. Dysplasia is where the cells no longer look normal. If a woman has been diagnosed with high-grade or severe dysplasia, they are at higher risk of developing cervical cancer. People diagnosed with low-grade dysplasia should still be seen by their caregiver because there is a small chance that low-grade dysplasia could develop into cancer.  LET YOUR CAREGIVER KNOW ABOUT:  Recent sexually transmitted infection (STI) you have had.  Any new sex partners you have had.  History of previous abnormal Pap tests results.  History of previous cervical procedures you have had (colposcopy, biopsy, loop electrosurgical excision procedure [LEEP]).  Concerns you have had regarding unusual vaginal discharge.  History of pelvic  pain.  Your use of birth control. BEFORE THE PROCEDURE  Ask your caregiver when to schedule your Pap test. It is best not to be on your period if your caregiver uses a wooden spatula to collect cells or applies cells to a glass slide. Newer techniques are not so sensitive to the timing of a menstrual cycle.  Do not douche or have sexual intercourse for 24 hours before the test.   Do not use vaginal creams or tampons for 24 hours before the test.   Empty your bladder just before the test to lessen any discomfort.  PROCEDURE You will lie on an exam table with your feet in stirrups. A warm metal or plastic instrument (speculum) is placed in your vagina. This instrument allows your caregiver to see the inside of your vagina and look at your cervix. A small, plastic brush or wooden spatula is then used to collect cervical cells. These cells are placed in a lab specimen container. The cells are looked at under a microscope. A specialist will determine if the cells are normal.  AFTER THE PROCEDURE Make sure to get your test results.If your results come back abnormal, you may need further testing.  Document Released: 11/03/2002 Document Revised: 11/05/2011 Document Reviewed: 08/09/2011 [facility] Patient Information 2013 [place], [place].

## 2012-11-14 NOTE — Progress Notes (Signed)
Subjective:     Jade [name] is a 57 y.o. female here for a routine exam.    Gynecologic History No LMP recorded. Patient is postmenopausal. LMP: 2 years ago.  Contraception: none Last Pap: 2010. Results were: normal. Had 3 normal PAP smears. Abnormal PAP: 1998 for which she had cone biopsy  Last mammogram: 2008 Results were: normal History of STD: none Sexual intercourse: 3 years ago with husband Gyn Surgeries: none Gyn Family History: 2nd cousin: breast cancer, 2 great aunts: ovarian cancer  Colonoscopy done in 2009 and scheduled for repeat in 10 years.  Obstetric History OB History   Grav Para Term Preterm Abortions TAB SAB Ect Mult Living   3 1 1  2  2   1      # Outc Date GA Lbr Len/2nd Wgt Sex Del Anes PTL Lv   1 TRM 3/80 [redacted]w[redacted]d    SVD   Yes   2 SAB            3 SAB               Review of Systems Constitutional: negative Respiratory: negative Cardiovascular: negative Gastrointestinal: negative Genitourinary:positive for urinary stress incontinence since birth of son in 19    Objective:   Physical Examination: General appearance - alert, well appearing, and in no distress Abdomen - soft, nontender, nondistended, no masses or organomegaly Breasts - breasts appear normal, no suspicious masses, no skin or nipple changes or axillary nodes Pelvic - normal external genitalia, vulva, vagina, cervix, uterus and adnexa Extremities - peripheral pulses normal, no pedal edema, no clubbing or cyanosis   Assessment:     57 yo post-menopausal woman who presents for annual exam .    Plan:   - follow up PAP smear results - mammogram scheduled today - colonoscopy: up to date (see above)  Attestation of Attending Supervision of Resident: Evaluation and management procedures were performed by the [facility] Medicine Resident under my supervision.  I have seen and examined the patient, reviewed the resident's note and chart, and I agree with the management and plan.  Anibal Henderson, M.D. 11/14/2012 9:54 AM

## 2012-12-19 ENCOUNTER — Ambulatory Visit
Admission: RE | Admit: 2012-12-19 | Discharge: 2012-12-19 | Disposition: A | Discharge status: Home / Self Care | Payer: Medicare Other | Source: Ambulatory Visit | Attending: Obstetrics & Gynecology | Admitting: Obstetrics & Gynecology

## 2012-12-19 DIAGNOSIS — Z1231 Encounter for screening mammogram for malignant neoplasm of breast: Secondary | ICD-10-CM

## 2012-12-19 LAB — HM MAMMOGRAPHY: HM Mammogram: NORMAL

## 2013-02-23 ENCOUNTER — Encounter: Payer: Self-pay | Admitting: Internal Medicine

## 2013-02-23 ENCOUNTER — Other Ambulatory Visit (INDEPENDENT_AMBULATORY_CARE_PROVIDER_SITE_OTHER): Payer: Medicare Other

## 2013-02-23 ENCOUNTER — Ambulatory Visit (INDEPENDENT_AMBULATORY_CARE_PROVIDER_SITE_OTHER): Payer: Medicare Other | Admitting: Internal Medicine

## 2013-02-23 VITALS — BP 124/68 | HR 65 | Temp 98.1°F | Resp 16 | Wt 193.0 lb

## 2013-02-23 DIAGNOSIS — Z9119 Patient's noncompliance with other medical treatment and regimen: Secondary | ICD-10-CM

## 2013-02-23 DIAGNOSIS — E7800 Pure hypercholesterolemia, unspecified: Secondary | ICD-10-CM

## 2013-02-23 DIAGNOSIS — Z9114 Patient's other noncompliance with medication regimen: Secondary | ICD-10-CM

## 2013-02-23 DIAGNOSIS — E1129 Type 2 diabetes mellitus with other diabetic kidney complication: Secondary | ICD-10-CM

## 2013-02-23 DIAGNOSIS — Z91148 Patient's other noncompliance with medication regimen for other reason: Secondary | ICD-10-CM

## 2013-02-23 DIAGNOSIS — Z91199 Patient's noncompliance with other medical treatment and regimen due to unspecified reason: Secondary | ICD-10-CM

## 2013-02-23 LAB — BASIC METABOLIC PANEL
CO2: 29 mEq/L (ref 19–32)
Chloride: 101 mEq/L (ref 96–112)
Sodium: 140 mEq/L (ref 135–145)

## 2013-02-23 LAB — LIPID PANEL
Cholesterol: 209 mg/dL — ABNORMAL HIGH (ref 0–200)
HDL: 55.2 mg/dL (ref >39.00)
Total CHOL/HDL Ratio: 4
Triglycerides: 98 mg/dL (ref 0.0–149.0)
VLDL: 19.6 mg/dL (ref 0.0–40.0)

## 2013-02-23 NOTE — Assessment & Plan Note (Signed)
She tells me that she has decided to get off of the "pharmaceutical hamster wheel" and has therefore stopped her meds for diabetes I will check her A1C today and will advise her of the results

## 2013-02-23 NOTE — Assessment & Plan Note (Signed)
I will check her FLP today at her request though she tells me that she will not take a statin

## 2013-02-23 NOTE — Patient Instructions (Signed)
Type 2 Diabetes Mellitus, Adult Type 2 diabetes mellitus, often simply referred to as type 2 diabetes, is a long-lasting (chronic) disease. In type 2 diabetes, the pancreas does not make enough insulin (a hormone), the cells are less responsive to the insulin that is made (insulin resistance), or both. Normally, insulin moves sugars from food into the tissue cells. The tissue cells use the sugars for energy. The lack of insulin or the lack of normal response to insulin causes excess sugars to build up in the blood instead of going into the tissue cells. As a result, high blood sugar (hyperglycemia) develops. The effect of high sugar (glucose) levels can cause many complications. Type 2 diabetes was also previously called adult-onset diabetes but it can occur at any age.  RISK FACTORS  A person is predisposed to developing type 2 diabetes if someone in the family has the disease and also has one or more of the following primary risk factors:  Overweight.  An inactive lifestyle.  A history of consistently eating high-calorie foods. Maintaining a normal weight and regular physical activity can reduce the chance of developing type 2 diabetes. SYMPTOMS  A person with type 2 diabetes may not show symptoms initially. The symptoms of type 2 diabetes appear slowly. The symptoms include:  Increased thirst (polydipsia).  Increased urination (polyuria).  Increased urination during the night (nocturia).  Weight loss. This weight loss may be rapid.  Frequent, recurring infections.  Tiredness (fatigue).  Weakness.  Vision changes, such as blurred vision.  Fruity smell to your breath.  Abdominal pain.  Nausea or vomiting.  Cuts or bruises which are slow to heal.  Tingling or numbness in the hands or feet. DIAGNOSIS Type 2 diabetes is frequently not diagnosed until complications of diabetes are present. Type 2 diabetes is diagnosed when symptoms or complications are present and when blood  glucose levels are increased. Your blood glucose level may be checked by one or more of the following blood tests:  A fasting blood glucose test. You will not be allowed to eat for at least 8 hours before a blood sample is taken.  A random blood glucose test. Your blood glucose is checked at any time of the day regardless of when you ate.  A hemoglobin A1c blood glucose test. A hemoglobin A1c test provides information about blood glucose control over the previous 3 months.  An oral glucose tolerance test (OGTT). Your blood glucose is measured after you have not eaten (fasted) for 2 hours and then after you drink a glucose-containing beverage. TREATMENT   You may need to take insulin or diabetes medicine daily to keep blood glucose levels in the desired range.  You will need to match insulin dosing with exercise and healthy food choices. The treatment goal is to maintain the before meal blood sugar (preprandial glucose) level at 70 130 mg/dL. HOME CARE INSTRUCTIONS   Have your hemoglobin A1c level checked twice a year.  Perform daily blood glucose monitoring as directed by your caregiver.  Monitor urine ketones when you are ill and as directed by your caregiver.  Take your diabetes medicine or insulin as directed by your caregiver to maintain your blood glucose levels in the desired range.  Never run out of diabetes medicine or insulin. It is needed every day.  Adjust insulin based on your intake of carbohydrates. Carbohydrates can raise blood glucose levels but need to be included in your diet. Carbohydrates provide vitamins, minerals, and fiber which are an essential part of   a healthy diet. Carbohydrates are found in fruits, vegetables, whole grains, dairy products, legumes, and foods containing added sugars.    Eat healthy foods. Alternate 3 meals with 3 snacks.  Lose weight if overweight.  Carry a medical alert card or wear your medical alert jewelry.  Carry a 15 gram  carbohydrate snack with you at all times to treat low blood glucose (hypoglycemia). Some examples of 15 gram carbohydrate snacks include:  Glucose tablets, 3 or 4   Glucose gel, 15 gram tube  Raisins, 2 tablespoons (24 grams)  Jelly beans, 6  Animal crackers, 8  Regular pop, 4 ounces (120 mL)  Gummy treats, 9  Recognize hypoglycemia. Hypoglycemia occurs with blood glucose levels of 70 mg/dL and below. The risk for hypoglycemia increases when fasting or skipping meals, during or after intense exercise, and during sleep. Hypoglycemia symptoms can include:  Tremors or shakes.  Decreased ability to concentrate.  Sweating.  Increased heart rate.  Headache.  Dry mouth.  Hunger.  Irritability.  Anxiety.  Restless sleep.  Altered speech or coordination.  Confusion.  Treat hypoglycemia promptly. If you are alert and able to safely swallow, follow the 15:15 rule:  Take 15 20 grams of rapid-acting glucose or carbohydrate. Rapid-acting options include glucose gel, glucose tablets, or 4 ounces (120 mL) of fruit juice, regular soda, or low fat milk.  Check your blood glucose level 15 minutes after taking the glucose.  Take 15 20 grams more of glucose if the repeat blood glucose level is still 70 mg/dL or below.  Eat a meal or snack within 1 hour once blood glucose levels return to normal.    Be alert to polyuria and polydipsia which are early signs of hyperglycemia. An early awareness of hyperglycemia allows for prompt treatment. Treat hyperglycemia as directed by your caregiver.  Engage in at least 150 minutes of moderate-intensity physical activity a week, spread over at least 3 days of the week or as directed by your caregiver. In addition, you should engage in resistance exercise at least 2 times a week or as directed by your caregiver.  Adjust your medicine and food intake as needed if you start a new exercise or sport.  Follow your sick day plan at any time you  are unable to eat or drink as usual.  Avoid tobacco use.  Limit alcohol intake to no more than 1 drink per day for nonpregnant women and 2 drinks per day for men. You should drink alcohol only when you are also eating food. Talk with your caregiver whether alcohol is safe for you. Tell your caregiver if you drink alcohol several times a week.  Follow up with your caregiver regularly.  Schedule an eye exam soon after the diagnosis of type 2 diabetes and then annually.  Perform daily skin and foot care. Examine your skin and feet daily for cuts, bruises, redness, nail problems, bleeding, blisters, or sores. A foot exam by a caregiver should be done annually.  Brush your teeth and gums at least twice a day and floss at least once a day. Follow up with your dentist regularly.  Share your diabetes management plan with your workplace or school.  Stay up-to-date with immunizations.  Learn to manage stress.  Obtain ongoing diabetes education and support as needed.  Participate in, or seek rehabilitation as needed to maintain or improve independence and quality of life. Request a physical or occupational therapy referral if you are having foot or hand numbness or difficulties with grooming,   dressing, eating, or physical activity. SEEK MEDICAL CARE IF:   You are unable to eat food or drink fluids for more than 6 hours.  You have nausea and vomiting for more than 6 hours.  Your blood glucose level is over 240 mg/dL.  There is a change in mental status.  You develop an additional serious illness.  You have diarrhea for more than 6 hours.  You have been sick or have had a fever for a couple of days and are not getting better.  You have pain during any physical activity.  SEEK IMMEDIATE MEDICAL CARE IF:  You have difficulty breathing.  You have moderate to large ketone levels. MAKE SURE YOU:  Understand these instructions.  Will watch your condition.  Will get help right away if  you are not doing well or get worse. Document Released: 08/13/2005 Document Revised: 05/07/2012 Document Reviewed: 03/11/2012 [employer] Patient Information 2014 [employer].  

## 2013-02-23 NOTE — Progress Notes (Signed)
Subjective:    Patient ID: Jade [name], female    DOB: 05-11-1956, 57 y.o.   MRN: [id number]  Diabetes She presents for her follow-up diabetic visit. She has type 2 diabetes mellitus. Her disease course has been stable. There are no hypoglycemic associated symptoms. Pertinent negatives for hypoglycemia include no dizziness, headaches or tremors. Associated symptoms include foot paresthesias. Pertinent negatives for diabetes include no blurred vision, no chest pain, no fatigue, no foot ulcerations, no polydipsia, no polyphagia, no polyuria, no visual change, no weakness and no weight loss. There are no hypoglycemic complications. Diabetic complications include peripheral neuropathy. She is compliant with treatment none of the time. Her weight is stable. She is following a generally healthy diet. Meal planning includes avoidance of concentrated sweets. She has not had a previous visit with a dietician. She participates in exercise intermittently. There is no change in her home blood glucose trend. An ACE inhibitor/angiotensin II receptor blocker is not being taken. She does not see a podiatrist.Eye exam is current.      Review of Systems  Constitutional: Negative.  Negative for fever, chills, weight loss, diaphoresis, activity change, appetite change, fatigue and unexpected weight change.  HENT: Negative.   Eyes: Negative.  Negative for blurred vision.  Respiratory: Negative.  Negative for cough, chest tightness, shortness of breath, wheezing and stridor.   Cardiovascular: Negative.  Negative for chest pain, palpitations and leg swelling.  Gastrointestinal: Negative.  Negative for nausea, vomiting, abdominal pain, diarrhea and constipation.  Endocrine: Negative.  Negative for polydipsia, polyphagia and polyuria.  Genitourinary: Negative.   Musculoskeletal: Negative.   Skin: Negative.   Allergic/Immunologic: Negative.   Neurological: Negative.  Negative for dizziness, tremors, weakness,  light-headedness and headaches.  Hematological: Negative.  Negative for adenopathy. Does not bruise/bleed easily.  Psychiatric/Behavioral: Negative.        Objective:   Physical Exam  Vitals reviewed. Constitutional: She is oriented to person, place, and time. She appears well-developed and well-nourished. No distress.  HENT:  Head: Normocephalic and atraumatic.  Mouth/Throat: Oropharynx is clear and moist. No oropharyngeal exudate.  Eyes: Conjunctivae are normal. Right eye exhibits no discharge. Left eye exhibits no discharge. No scleral icterus.  Neck: Normal range of motion. Neck supple. No JVD present. No tracheal deviation present. No thyromegaly present.  Cardiovascular: Normal rate, regular rhythm, normal heart sounds and intact distal pulses.  Exam reveals no gallop and no friction rub.   No murmur heard. Pulmonary/Chest: Effort normal and breath sounds normal. No stridor. No respiratory distress. She has no wheezes. She has no rales. She exhibits no tenderness.  Abdominal: Soft. Bowel sounds are normal. She exhibits no distension and no mass. There is no tenderness. There is no rebound and no guarding.  Musculoskeletal: Normal range of motion. She exhibits no edema and no tenderness.  Lymphadenopathy:    She has no cervical adenopathy.  Neurological: She is oriented to person, place, and time.  Skin: Skin is warm and dry. No rash noted. She is not diaphoretic. No erythema. No pallor.  Psychiatric: She has a normal mood and affect. Her behavior is normal. Judgment and thought content normal.     Lab Results  Component Value Date   WBC 9.5 09/26/2012   HGB 13.6 09/26/2012   HCT 39.7 09/26/2012   PLT 356.0 09/26/2012   GLUCOSE 148* 09/26/2012   CHOL 209* 09/26/2012   TRIG 176.0* 09/26/2012   HDL 37.60* 09/26/2012   LDLDIRECT 136.4 09/26/2012   LDLCALC  Value: 87  Total Cholesterol/HDL:CHD Risk Coronary Heart Disease Risk Table                     Men   Women  1/2 Average  Risk   3.4   3.3  Average Risk       5.0   4.4  2 X Average Risk   9.6   7.1  3 X Average Risk  23.4   11.0        Use the calculated Patient Ratio above and the CHD Risk Table to determine the patient's CHD Risk.        ATP III CLASSIFICATION (LDL):  <100     mg/dL   Optimal  784-696  mg/dL   Near or Above                    Optimal  130-159  mg/dL   Borderline  295-284  mg/dL   High  >132     mg/dL   Very High 44/08/270   ALT 20 09/26/2012   AST 21 09/26/2012   NA 138 09/26/2012   K 3.7 09/26/2012   CL 100 09/26/2012   CREATININE 1.1 09/26/2012   BUN 27* 09/26/2012   CO2 29 09/26/2012   TSH 3.38 03/20/2012   HGBA1C 6.0 09/26/2012       Assessment & Plan:

## 2013-02-23 NOTE — Assessment & Plan Note (Signed)
She will not take a statin 

## 2013-03-30 ENCOUNTER — Other Ambulatory Visit: Payer: Self-pay | Admitting: Internal Medicine

## 2013-04-27 ENCOUNTER — Ambulatory Visit: Payer: Medicare Other

## 2013-04-27 ENCOUNTER — Ambulatory Visit (INDEPENDENT_AMBULATORY_CARE_PROVIDER_SITE_OTHER): Payer: Medicare Other | Admitting: Emergency Medicine

## 2013-04-27 VITALS — BP 124/78 | HR 80 | Temp 98.1°F | Resp 16 | Ht 62.25 in | Wt 192.0 lb

## 2013-04-27 DIAGNOSIS — S20229A Contusion of unspecified back wall of thorax, initial encounter: Secondary | ICD-10-CM

## 2013-04-27 DIAGNOSIS — M545 Low back pain, unspecified: Secondary | ICD-10-CM

## 2013-04-27 DIAGNOSIS — I10 Essential (primary) hypertension: Secondary | ICD-10-CM

## 2013-04-27 DIAGNOSIS — M2390 Unspecified internal derangement of unspecified knee: Secondary | ICD-10-CM

## 2013-04-27 DIAGNOSIS — M25561 Pain in right knee: Secondary | ICD-10-CM

## 2013-04-27 DIAGNOSIS — M25569 Pain in unspecified knee: Secondary | ICD-10-CM

## 2013-04-27 MED ORDER — NAPROXEN SODIUM 550 MG PO TABS: 550.0000 mg | ORAL_TABLET | Freq: Two times a day (BID) | ORAL | Status: AC

## 2013-04-27 NOTE — Progress Notes (Signed)
[facility] [address], [place] [address] [phone]  Date:  04/27/2013   Name:  Jade [name]   DOB:  [birth year]   MRN:  [id number]  PCP:  Sanda Linger, MD    Chief Complaint: Back Pain and Knee Pain   History of Present Illness:  Jade [name] is a 57 y.o. very pleasant female patient who presents with the following:  Slipped on a step while walking into the laundry room and landed on her butt with her right leg bent hyperflexing her right knee.  No radiation of pain.  No neuro symptoms.  Has pain in low back and right knee. No improvement with over the counter medications or other home remedies. Denies other complaint or health concern today.   Patient Active Problem List   Diagnosis Date Noted  . Patient noncompliant with statin medication 02/23/2013  . H/O abnormal Pap smear 10/24/2012  . Abdominal tenderness, RUQ (right upper quadrant) 09/26/2012  . Osteopenia 09/26/2012  . Diabetic neuropathy, painful 05/26/2012  . Type II or unspecified type diabetes mellitus with renal manifestations, not stated as uncontrolled(250.40) 03/20/2012  . Pure hypercholesterolemia 03/20/2012  . Chronic venous insufficiency 03/20/2012  . Other screening mammogram 03/20/2012  . Edema 03/20/2012    Past Medical History  Diagnosis Date  . Diabetes mellitus   . COPD (chronic obstructive pulmonary disease)   . Neuromuscular disorder   . Chronic kidney disease   . Hyperlipidemia   . Hypertension   . Sleep apnea     Past Surgical History  Procedure Laterality Date  . Hernia repair    . Cholecystectomy    . Tonsillectomy      History  Substance Use Topics  . Smoking status: Former Smoker    Quit date: 08/28/1987  . Smokeless tobacco: Never Used  . Alcohol Use: No    Family History  Problem Relation Age of Onset  . Heart disease Father   . Leukemia Father   . Alcohol abuse Son   . Cancer Neg Hx   . Diabetes Neg Hx   . Hearing loss Neg Hx    . Hyperlipidemia Neg Hx   . Hypertension Neg Hx   . Kidney disease Neg Hx   . Stroke Neg Hx     No Known Allergies  Medication list has been reviewed and updated.  Current Outpatient Prescriptions on File Prior to Visit  Medication Sig Dispense Refill  . Alpha-Lipoic Acid 600 MG CAPS Take by mouth 2 (two) times daily.      . Betaine, Trimethylglycine, (TMG, TRIMETHYLGLYCINE,) 500 MG CAPS Take by mouth 2 (two) times daily.      Marland Kitchen Bioflavonoid Products (GRAPE SEED PO) Take 300 mg by mouth 3 (three) times daily.      . Calcium-Magnesium-Vitamin D (CALCIUM 500 PO) Take by mouth. tid      . Cholecalciferol (VITAMIN D-3) 5000 UNITS TABS Take by mouth.      . Chromium-Cinnamon (CINNAMON PLUS CHROMIUM) [phone] MCG-MG CAPS Take 10,000 mg by mouth 2 (two) times daily.      . furosemide (LASIX) 80 MG tablet TAKE 2 TABLETS BY MOUTH TWICE A DAY  120 tablet  5  . Gymnema Sylvestris Leaf POWD by Does not apply route.      . METHYLCOBALAMIN PO Take by mouth.      . Milk Thistle 1000 MG CAPS Take by mouth 3 (three) times daily.      . Multiple Vitamins-Minerals (  MULTIVITAL-M) TABS Take 1 each by mouth daily.      . Omega-3 Fatty Acids (FISH OIL) 600 MG CAPS Take by mouth.      Marland Kitchen OVER THE COUNTER MEDICATION L Arginine 900 mg bid      . OVER THE COUNTER MEDICATION methylfolate 800 mg 5xdaily      . OVER THE COUNTER MEDICATION P5P 50 mg bid      . OVER THE COUNTER MEDICATION Triple Magnesium      . OVER THE COUNTER MEDICATION Olive Leap extract 75 mg BID      . Pomegranate, Punica granatum, (POMEGRANATE PO) Take by mouth 2 (two) times daily.      . potassium gluconate 595 MG TABS Take 595 mg by mouth 3 (three) times daily.      . Probiotic Product (PROBIOTIC PO) Take by mouth.      . RESVERATROL 100 MG CAPS Take by mouth.      . Taurine 1000 MG CAPS Take by mouth 3 (three) times daily.      Marland Kitchen VITAMIN K PO Take by mouth. 50 mg QD       No current facility-administered medications on file prior to  visit.    Review of Systems:  As per HPI, otherwise negative.    Physical Examination: Filed Vitals:   04/27/13 1407  BP: 124/78  Pulse: 80  Temp: 98.1 F (36.7 C)  Resp: 16   Filed Vitals:   04/27/13 1407  Height: 5' 2.25" (1.581 m)  Weight: 192 lb (87.091 kg)   Body mass index is 34.84 kg/(m^2). Ideal Body Weight: Weight in (lb) to have BMI = 25: 137.5   GEN: WDWN, NAD, Non-toxic, Alert & Oriented x 3 HEENT: Atraumatic, Normocephalic.  Ears and Nose: No external deformity. EXTR: No clubbing/cyanosis/edema NEURO: Normal gait.  PSYCH: Normally interactive. Conversant. Not depressed or anxious appearing.  Calm demeanor.  BACK:  No contusion.  Tender lumbar region.  Neuro intact RIGHT knee:  Little effusion.  Tender warm knee.  Unable to flex past 90 degrees.  Full extension.  Joint stable   Assessment and Plan: Hyperflexion injury knee Lumbar contusion Anaprox  Signed,  Phillips Odor, MD   [facility] reading (PRIMARY) by  Dr. Dareen Piano.  Knee DJD.  [facility] reading (PRIMARY) by  Dr. Dareen Piano.  LS Spine:  DJD, calcification aorta.Marland Kitchen

## 2013-04-27 NOTE — Patient Instructions (Addendum)
Knee Pain  The knee is the complex joint between your thigh and your lower leg. It is made up of bones, tendons, ligaments, and cartilage. The bones that make up the knee are:   The femur in the thigh.   The tibia and fibula in the lower leg.   The patella or kneecap riding in the groove on the lower femur.  CAUSES   Knee pain is a common complaint with many causes. A few of these causes are:   Injury, such as:   A ruptured ligament or tendon injury.   Torn cartilage.   Medical conditions, such as:   Gout   Arthritis   Infections   Overuse, over training or overdoing a physical activity.  Knee pain can be minor or severe. Knee pain can accompany debilitating injury. Minor knee problems often respond well to self-care measures or get well on their own. More serious injuries may need medical intervention or even surgery.  SYMPTOMS  The knee is complex. Symptoms of knee problems can vary widely. Some of the problems are:   Pain with movement and weight bearing.   Swelling and tenderness.   Buckling of the knee.   Inability to straighten or extend your knee.   Your knee locks and you cannot straighten it.   Warmth and redness with pain and fever.   Deformity or dislocation of the kneecap.  DIAGNOSIS   Determining what is wrong may be very straight forward such as when there is an injury. It can also be challenging because of the complexity of the knee. Tests to make a diagnosis may include:   Your caregiver taking a history and doing a physical exam.   Routine X-rays can be used to rule out other problems. X-rays will not reveal a cartilage tear. Some injuries of the knee can be diagnosed by:   Arthroscopy a surgical technique by which a small video camera is inserted through tiny incisions on the sides of the knee. This procedure is used to examine and repair internal knee joint problems. Tiny instruments can be used during arthroscopy to repair the torn knee cartilage (meniscus).   Arthrography  is a radiology technique. A contrast liquid is directly injected into the knee joint. Internal structures of the knee joint then become visible on X-ray film.   An MRI scan is a non x-ray radiology procedure in which magnetic fields and a computer produce two- or three-dimensional images of the inside of the knee. Cartilage tears are often visible using an MRI scanner. MRI scans have largely replaced arthrography in diagnosing cartilage tears of the knee.   Blood work.   Examination of the fluid that helps to lubricate the knee joint (synovial fluid). This is done by taking a sample out using a needle and a syringe.  TREATMENT  The treatment of knee problems depends on the cause. Some of these treatments are:   Depending on the injury, proper casting, splinting, surgery or physical therapy care will be needed.   Give yourself adequate recovery time. Do not overuse your joints. If you begin to get sore during workout routines, back off. Slow down or do fewer repetitions.   For repetitive activities such as cycling or running, maintain your strength and nutrition.   Alternate muscle groups. For example if you are a weight lifter, work the upper body on one day and the lower body the next.   Either tight or weak muscles do not give the proper support for your   knee. Tight or weak muscles do not absorb the stress placed on the knee joint. Keep the muscles surrounding the knee strong.   Take care of mechanical problems.   If you have flat feet, orthotics or special shoes may help. See your caregiver if you need help.   Arch supports, sometimes with wedges on the inner or outer aspect of the heel, can help. These can shift pressure away from the side of the knee most bothered by osteoarthritis.   A brace called an "unloader" brace also may be used to help ease the pressure on the most arthritic side of the knee.   If your caregiver has prescribed crutches, braces, wraps or ice, use as directed. The acronym for  this is PRICE. This means protection, rest, ice, compression and elevation.   Nonsteroidal anti-inflammatory drugs (NSAID's), can help relieve pain. But if taken immediately after an injury, they may actually increase swelling. Take NSAID's with food in your stomach. Stop them if you develop stomach problems. Do not take these if you have a history of ulcers, stomach pain or bleeding from the bowel. Do not take without your caregiver's approval if you have problems with fluid retention, heart failure, or kidney problems.   For ongoing knee problems, physical therapy may be helpful.   Glucosamine and chondroitin are over-the-counter dietary supplements. Both may help relieve the pain of osteoarthritis in the knee. These medicines are different from the usual anti-inflammatory drugs. Glucosamine may decrease the rate of cartilage destruction.   Injections of a corticosteroid drug into your knee joint may help reduce the symptoms of an arthritis flare-up. They may provide pain relief that lasts a few months. You may have to wait a few months between injections. The injections do have a small increased risk of infection, water retention and elevated blood sugar levels.   Hyaluronic acid injected into damaged joints may ease pain and provide lubrication. These injections may work by reducing inflammation. A series of shots may give relief for as long as 6 months.   Topical painkillers. Applying certain ointments to your skin may help relieve the pain and stiffness of osteoarthritis. Ask your pharmacist for suggestions. Many over the-counter products are approved for temporary relief of arthritis pain.   In some countries, doctors often prescribe topical NSAID's for relief of chronic conditions such as arthritis and tendinitis. A review of treatment with NSAID creams found that they worked as well as oral medications but without the serious side effects.  PREVENTION   Maintain a healthy weight. Extra pounds put  more strain on your joints.   Get strong, stay limber. Weak muscles are a common cause of knee injuries. Stretching is important. Include flexibility exercises in your workouts.   Be smart about exercise. If you have osteoarthritis, chronic knee pain or recurring injuries, you may need to change the way you exercise. This does not mean you have to stop being active. If your knees ache after jogging or playing basketball, consider switching to swimming, water aerobics or other low-impact activities, at least for a few days a week. Sometimes limiting high-impact activities will provide relief.   Make sure your shoes fit well. Choose footwear that is right for your sport.   Protect your knees. Use the proper gear for knee-sensitive activities. Use kneepads when playing volleyball or laying carpet. Buckle your seat belt every time you drive. Most shattered kneecaps occur in car accidents.   Rest when you are tired.  SEEK MEDICAL CARE IF:     You have knee pain that is continual and does not seem to be getting better.   SEEK IMMEDIATE MEDICAL CARE IF:   Your knee joint feels hot to the touch and you have a high fever.  MAKE SURE YOU:    Understand these instructions.   Will watch your condition.   Will get help right away if you are not doing well or get worse.  Document Released: 06/10/2007 Document Revised: 11/05/2011 Document Reviewed: 06/10/2007  [employer] Patient Information 2014 [employer].

## 2013-04-28 NOTE — Addendum Note (Signed)
Addended by: Carmelina Dane on: 04/28/2013 12:17 PM   Modules accepted: Orders

## 2013-05-01 ENCOUNTER — Telehealth: Payer: Self-pay | Admitting: Radiology

## 2013-05-01 NOTE — Telephone Encounter (Signed)
Message copied by Caffie Damme on Fri May 01, 2013  2:16 PM ------      Message from: [place], [place] S      Created: Tue Apr 28, 2013 12:19 PM      Regarding: labs       Please ask patient to come in and have her fasting blood drawn ASAP. Thanks.  This will be available to her new doctor. ------

## 2013-05-01 NOTE — Telephone Encounter (Signed)
This was sent as staff message on 04/28/13 I am just now checking these. Left message for her to call me back.

## 2013-05-07 NOTE — Telephone Encounter (Signed)
Patient has not responded to our phone calls

## 2013-06-19 ENCOUNTER — Other Ambulatory Visit: Payer: Self-pay | Admitting: Internal Medicine

## 2013-06-23 ENCOUNTER — Telehealth: Payer: Self-pay | Admitting: Internal Medicine

## 2013-06-23 NOTE — Telephone Encounter (Signed)
Glucose test strips with dx codes was sent to [facility] pharmacy.

## 2013-06-23 NOTE — Telephone Encounter (Signed)
06/23/2013   Pt left message stating that [facility] pharmacy will not fill the RX for her glucose test strips because it is missing the proper dx codes.  Pt wants the RX resent.  Contact pt @ [phone]  Or [facility] pharmacy @  [phone]

## 2013-08-24 ENCOUNTER — Encounter: Payer: Self-pay | Admitting: Internal Medicine

## 2013-08-24 ENCOUNTER — Ambulatory Visit (INDEPENDENT_AMBULATORY_CARE_PROVIDER_SITE_OTHER): Payer: Medicare Other | Admitting: Internal Medicine

## 2013-08-24 ENCOUNTER — Other Ambulatory Visit (INDEPENDENT_AMBULATORY_CARE_PROVIDER_SITE_OTHER): Payer: Medicare Other

## 2013-08-24 VITALS — BP 102/68 | HR 52 | Temp 95.6°F | Resp 16 | Wt 186.0 lb

## 2013-08-24 DIAGNOSIS — R5381 Other malaise: Secondary | ICD-10-CM

## 2013-08-24 DIAGNOSIS — M545 Low back pain, unspecified: Secondary | ICD-10-CM | POA: Insufficient documentation

## 2013-08-24 DIAGNOSIS — R5383 Other fatigue: Secondary | ICD-10-CM | POA: Insufficient documentation

## 2013-08-24 DIAGNOSIS — E7800 Pure hypercholesterolemia, unspecified: Secondary | ICD-10-CM

## 2013-08-24 DIAGNOSIS — E1129 Type 2 diabetes mellitus with other diabetic kidney complication: Secondary | ICD-10-CM

## 2013-08-24 LAB — COMPREHENSIVE METABOLIC PANEL
ALT: 16 U/L (ref 0–35)
Albumin: 4.3 g/dL (ref 3.5–5.2)
CO2: 26 mEq/L (ref 19–32)
Calcium: 9.6 mg/dL (ref 8.4–10.5)
Chloride: 104 mEq/L (ref 96–112)
GFR: 55.25 mL/min — ABNORMAL LOW (ref >60.00)
Potassium: 3.7 mEq/L (ref 3.5–5.1)
Sodium: 144 mEq/L (ref 135–145)
Total Protein: 7.1 g/dL (ref 6.0–8.3)

## 2013-08-24 LAB — LIPID PANEL
HDL: 58.4 mg/dL (ref >39.00)
Total CHOL/HDL Ratio: 4
VLDL: 15.6 mg/dL (ref 0.0–40.0)

## 2013-08-24 LAB — LDL CHOLESTEROL, DIRECT: Direct LDL: 164.7 mg/dL

## 2013-08-24 NOTE — Assessment & Plan Note (Signed)
I will recheck her A1C and will advise further if needed 

## 2013-08-24 NOTE — Patient Instructions (Signed)
Type 2 Diabetes Mellitus, Adult Type 2 diabetes mellitus, often simply referred to as type 2 diabetes, is a long-lasting (chronic) disease. In type 2 diabetes, the pancreas does not make enough insulin (a hormone), the cells are less responsive to the insulin that is made (insulin resistance), or both. Normally, insulin moves sugars from food into the tissue cells. The tissue cells use the sugars for energy. The lack of insulin or the lack of normal response to insulin causes excess sugars to build up in the blood instead of going into the tissue cells. As a result, high blood sugar (hyperglycemia) develops. The effect of high sugar (glucose) levels can cause many complications. Type 2 diabetes was also previously called adult-onset diabetes but it can occur at any age.  RISK FACTORS  A person is predisposed to developing type 2 diabetes if someone in the family has the disease and also has one or more of the following primary risk factors:  Overweight.  An inactive lifestyle.  A history of consistently eating high-calorie foods. Maintaining a normal weight and regular physical activity can reduce the chance of developing type 2 diabetes. SYMPTOMS  A person with type 2 diabetes may not show symptoms initially. The symptoms of type 2 diabetes appear slowly. The symptoms include:  Increased thirst (polydipsia).  Increased urination (polyuria).  Increased urination during the night (nocturia).  Weight loss. This weight loss may be rapid.  Frequent, recurring infections.  Tiredness (fatigue).  Weakness.  Vision changes, such as blurred vision.  Fruity smell to your breath.  Abdominal pain.  Nausea or vomiting.  Cuts or bruises which are slow to heal.  Tingling or numbness in the hands or feet. DIAGNOSIS Type 2 diabetes is frequently not diagnosed until complications of diabetes are present. Type 2 diabetes is diagnosed when symptoms or complications are present and when blood  glucose levels are increased. Your blood glucose level may be checked by one or more of the following blood tests:  A fasting blood glucose test. You will not be allowed to eat for at least 8 hours before a blood sample is taken.  A random blood glucose test. Your blood glucose is checked at any time of the day regardless of when you ate.  A hemoglobin A1c blood glucose test. A hemoglobin A1c test provides information about blood glucose control over the previous 3 months.  An oral glucose tolerance test (OGTT). Your blood glucose is measured after you have not eaten (fasted) for 2 hours and then after you drink a glucose-containing beverage. TREATMENT   You may need to take insulin or diabetes medicine daily to keep blood glucose levels in the desired range.  You will need to match insulin dosing with exercise and healthy food choices. The treatment goal is to maintain the before meal blood sugar (preprandial glucose) level at 70 130 mg/dL. HOME CARE INSTRUCTIONS   Have your hemoglobin A1c level checked twice a year.  Perform daily blood glucose monitoring as directed by your caregiver.  Monitor urine ketones when you are ill and as directed by your caregiver.  Take your diabetes medicine or insulin as directed by your caregiver to maintain your blood glucose levels in the desired range.  Never run out of diabetes medicine or insulin. It is needed every day.  Adjust insulin based on your intake of carbohydrates. Carbohydrates can raise blood glucose levels but need to be included in your diet. Carbohydrates provide vitamins, minerals, and fiber which are an essential part of   a healthy diet. Carbohydrates are found in fruits, vegetables, whole grains, dairy products, legumes, and foods containing added sugars.    Eat healthy foods. Alternate 3 meals with 3 snacks.  Lose weight if overweight.  Carry a medical alert card or wear your medical alert jewelry.  Carry a 15 gram  carbohydrate snack with you at all times to treat low blood glucose (hypoglycemia). Some examples of 15 gram carbohydrate snacks include:  Glucose tablets, 3 or 4   Glucose gel, 15 gram tube  Raisins, 2 tablespoons (24 grams)  Jelly beans, 6  Animal crackers, 8  Regular pop, 4 ounces (120 mL)  Gummy treats, 9  Recognize hypoglycemia. Hypoglycemia occurs with blood glucose levels of 70 mg/dL and below. The risk for hypoglycemia increases when fasting or skipping meals, during or after intense exercise, and during sleep. Hypoglycemia symptoms can include:  Tremors or shakes.  Decreased ability to concentrate.  Sweating.  Increased heart rate.  Headache.  Dry mouth.  Hunger.  Irritability.  Anxiety.  Restless sleep.  Altered speech or coordination.  Confusion.  Treat hypoglycemia promptly. If you are alert and able to safely swallow, follow the 15:15 rule:  Take 15 20 grams of rapid-acting glucose or carbohydrate. Rapid-acting options include glucose gel, glucose tablets, or 4 ounces (120 mL) of fruit juice, regular soda, or low fat milk.  Check your blood glucose level 15 minutes after taking the glucose.  Take 15 20 grams more of glucose if the repeat blood glucose level is still 70 mg/dL or below.  Eat a meal or snack within 1 hour once blood glucose levels return to normal.    Be alert to polyuria and polydipsia which are early signs of hyperglycemia. An early awareness of hyperglycemia allows for prompt treatment. Treat hyperglycemia as directed by your caregiver.  Engage in at least 150 minutes of moderate-intensity physical activity a week, spread over at least 3 days of the week or as directed by your caregiver. In addition, you should engage in resistance exercise at least 2 times a week or as directed by your caregiver.  Adjust your medicine and food intake as needed if you start a new exercise or sport.  Follow your sick day plan at any time you  are unable to eat or drink as usual.  Avoid tobacco use.  Limit alcohol intake to no more than 1 drink per day for nonpregnant women and 2 drinks per day for men. You should drink alcohol only when you are also eating food. Talk with your caregiver whether alcohol is safe for you. Tell your caregiver if you drink alcohol several times a week.  Follow up with your caregiver regularly.  Schedule an eye exam soon after the diagnosis of type 2 diabetes and then annually.  Perform daily skin and foot care. Examine your skin and feet daily for cuts, bruises, redness, nail problems, bleeding, blisters, or sores. A foot exam by a caregiver should be done annually.  Brush your teeth and gums at least twice a day and floss at least once a day. Follow up with your dentist regularly.  Share your diabetes management plan with your workplace or school.  Stay up-to-date with immunizations.  Learn to manage stress.  Obtain ongoing diabetes education and support as needed.  Participate in, or seek rehabilitation as needed to maintain or improve independence and quality of life. Request a physical or occupational therapy referral if you are having foot or hand numbness or difficulties with grooming,   dressing, eating, or physical activity. SEEK MEDICAL CARE IF:   You are unable to eat food or drink fluids for more than 6 hours.  You have nausea and vomiting for more than 6 hours.  Your blood glucose level is over 240 mg/dL.  There is a change in mental status.  You develop an additional serious illness.  You have diarrhea for more than 6 hours.  You have been sick or have had a fever for a couple of days and are not getting better.  You have pain during any physical activity.  SEEK IMMEDIATE MEDICAL CARE IF:  You have difficulty breathing.  You have moderate to large ketone levels. MAKE SURE YOU:  Understand these instructions.  Will watch your condition.  Will get help right away if  you are not doing well or get worse. Document Released: 08/13/2005 Document Revised: 05/07/2012 Document Reviewed: 03/11/2012 [employer] Patient Information 2014 [employer].  

## 2013-08-24 NOTE — Assessment & Plan Note (Signed)
I will check her labs to look for secondary causes of fatigue 

## 2013-08-24 NOTE — Assessment & Plan Note (Signed)
I have asked her to see pain management about this

## 2013-08-24 NOTE — Assessment & Plan Note (Signed)
FLP today 

## 2013-08-24 NOTE — Progress Notes (Signed)
Pre visit review using our clinic review tool, if applicable. No additional management support is needed unless otherwise documented below in the visit note. 

## 2013-08-24 NOTE — Progress Notes (Signed)
Subjective:    Patient ID: Jade [name], female    DOB: 02/15/1956, 57 y.o.   MRN: [id number]  Back Pain This is a recurrent problem. Episode onset: 3 months after a fall. The problem occurs intermittently. The problem is unchanged. The pain is present in the lumbar spine. The quality of the pain is described as aching. The pain does not radiate. The pain is at a severity of 2/10. The pain is worse during the day. The symptoms are aggravated by bending, position and standing. Pertinent negatives include no abdominal pain, bladder incontinence, bowel incontinence, chest pain, dysuria, fever, headaches, leg pain, numbness, paresis, paresthesias, pelvic pain, perianal numbness, tingling, weakness or weight loss. Risk factors include recent trauma. She has tried nothing for the symptoms. The treatment provided no relief.      Review of Systems  Constitutional: Positive for fatigue and unexpected weight change. Negative for fever, chills, weight loss, diaphoresis, activity change and appetite change.  HENT: Negative.   Eyes: Negative.   Respiratory: Negative.  Negative for apnea, cough, choking, chest tightness, shortness of breath and stridor.   Cardiovascular: Negative.  Negative for chest pain, palpitations and leg swelling.  Gastrointestinal: Negative.  Negative for nausea, vomiting, abdominal pain, diarrhea, constipation, blood in stool, rectal pain and bowel incontinence.  Endocrine: Negative.  Negative for polydipsia, polyphagia and polyuria.  Genitourinary: Negative.  Negative for bladder incontinence, dysuria and pelvic pain.  Musculoskeletal: Positive for arthralgias (knees) and back pain. Negative for gait problem, joint swelling, myalgias, neck pain and neck stiffness.  Skin: Negative.   Allergic/Immunologic: Negative.   Neurological: Negative.  Negative for tingling, weakness, numbness, headaches and paresthesias.  Hematological: Negative.  Negative for adenopathy. Does not  bruise/bleed easily.  Psychiatric/Behavioral: Negative.        Objective:   Physical Exam  Vitals reviewed. Constitutional: She is oriented to person, place, and time. She appears well-developed and well-nourished. No distress.  HENT:  Head: Normocephalic and atraumatic.  Mouth/Throat: Oropharynx is clear and moist. No oropharyngeal exudate.  Eyes: Conjunctivae are normal. Right eye exhibits no discharge. Left eye exhibits no discharge. No scleral icterus.  Neck: Normal range of motion. Neck supple. No JVD present. No tracheal deviation present. No thyromegaly present.  Cardiovascular: Normal rate, regular rhythm, normal heart sounds and intact distal pulses.  Exam reveals no gallop and no friction rub.   No murmur heard. Pulmonary/Chest: Effort normal. No stridor. No respiratory distress. She has no wheezes. She has no rales. She exhibits no tenderness.  Abdominal: Soft. Bowel sounds are normal. She exhibits no distension and no mass. There is no tenderness. There is no rebound and no guarding.  Musculoskeletal: Normal range of motion. She exhibits no edema and no tenderness.       Lumbar back: Normal. She exhibits normal range of motion, no tenderness, no bony tenderness, no swelling, no edema, no deformity, no laceration, no pain, no spasm and normal pulse.  Lymphadenopathy:    She has no cervical adenopathy.  Neurological: She is alert and oriented to person, place, and time. She has normal strength and normal reflexes. She displays no atrophy, no tremor and normal reflexes. No cranial nerve deficit or sensory deficit. She exhibits normal muscle tone. She displays a negative Romberg sign. She displays no seizure activity. Coordination and gait normal.  Ned SLR in BLE  Skin: Skin is warm and dry. No rash noted. She is not diaphoretic. No erythema. No pallor.  Psychiatric: She has a normal mood  and affect. Her behavior is normal. Judgment and thought content normal.     Lab Results    Component Value Date   WBC 9.5 09/26/2012   HGB 13.6 09/26/2012   HCT 39.7 09/26/2012   PLT 356.0 09/26/2012   GLUCOSE 129* 02/23/2013   CHOL 209* 02/23/2013   TRIG 98.0 02/23/2013   HDL 55.20 02/23/2013   LDLDIRECT 141.0 02/23/2013   LDLCALC  Value: 87        Total Cholesterol/HDL:CHD Risk Coronary Heart Disease Risk Table                     Men   Women  1/2 Average Risk   3.4   3.3  Average Risk       5.0   4.4  2 X Average Risk   9.6   7.1  3 X Average Risk  23.4   11.0        Use the calculated Patient Ratio above and the CHD Risk Table to determine the patient's CHD Risk.        ATP III CLASSIFICATION (LDL):  <100     mg/dL   Optimal  161-096  mg/dL   Near or Above                    Optimal  130-159  mg/dL   Borderline  045-409  mg/dL   High  >811     mg/dL   Very High 91/47/8295   ALT 20 09/26/2012   AST 21 09/26/2012   NA 140 02/23/2013   K 3.8 02/23/2013   CL 101 02/23/2013   CREATININE 1.1 02/23/2013   BUN 32* 02/23/2013   CO2 29 02/23/2013   TSH 3.38 03/20/2012   HGBA1C 6.3 02/23/2013       Assessment & Plan:

## 2013-08-25 ENCOUNTER — Encounter: Payer: Self-pay | Admitting: Internal Medicine

## 2013-08-25 LAB — THYROID PEROXIDASE ANTIBODY: Thyroperoxidase Ab SerPl-aCnc: <10 IU/mL (ref <35.0)

## 2013-09-07 ENCOUNTER — Other Ambulatory Visit: Payer: Self-pay

## 2013-09-07 MED ORDER — GLUCOSE BLOOD VI STRP: ORAL_STRIP | Status: DC

## 2013-09-11 ENCOUNTER — Telehealth: Payer: Self-pay | Admitting: *Deleted

## 2013-09-11 DIAGNOSIS — E1129 Type 2 diabetes mellitus with other diabetic kidney complication: Secondary | ICD-10-CM

## 2013-09-11 MED ORDER — GLUCOSE BLOOD VI STRP: ORAL_STRIP | Status: DC

## 2013-09-11 NOTE — Telephone Encounter (Signed)
Patient phoned stating pharmacy couldn't fill script for test strip for lack of dx code. Last OV with PCP 08/24/13.  Re-sent to pharmacy with 250.40 dx code.  Left message notifying patient on her voicemail. Confirmed receipt of refill order with pharmacist, Thedore Mins.

## 2013-09-14 ENCOUNTER — Other Ambulatory Visit: Payer: Self-pay

## 2013-09-14 DIAGNOSIS — E1129 Type 2 diabetes mellitus with other diabetic kidney complication: Secondary | ICD-10-CM

## 2013-09-14 MED ORDER — GLUCOSE BLOOD VI STRP: ORAL_STRIP | Status: DC

## 2013-12-21 ENCOUNTER — Ambulatory Visit (INDEPENDENT_AMBULATORY_CARE_PROVIDER_SITE_OTHER): Payer: Medicare Other | Admitting: Internal Medicine

## 2013-12-21 ENCOUNTER — Other Ambulatory Visit (INDEPENDENT_AMBULATORY_CARE_PROVIDER_SITE_OTHER): Payer: Medicare Other

## 2013-12-21 ENCOUNTER — Encounter: Payer: Self-pay | Admitting: Internal Medicine

## 2013-12-21 VITALS — BP 152/82 | HR 60 | Temp 98.0°F | Resp 16 | Ht 65.25 in | Wt 182.4 lb

## 2013-12-21 DIAGNOSIS — E1129 Type 2 diabetes mellitus with other diabetic kidney complication: Secondary | ICD-10-CM

## 2013-12-21 DIAGNOSIS — R5383 Other fatigue: Secondary | ICD-10-CM

## 2013-12-21 DIAGNOSIS — E7800 Pure hypercholesterolemia, unspecified: Secondary | ICD-10-CM

## 2013-12-21 DIAGNOSIS — B354 Tinea corporis: Secondary | ICD-10-CM

## 2013-12-21 DIAGNOSIS — R5381 Other malaise: Secondary | ICD-10-CM

## 2013-12-21 DIAGNOSIS — I10 Essential (primary) hypertension: Secondary | ICD-10-CM

## 2013-12-21 LAB — CBC WITH DIFFERENTIAL/PLATELET
BASOS ABS: 0.1 10*3/uL (ref 0.0–0.1)
Basophils Relative: 1.3 % (ref 0.0–3.0)
EOS ABS: 0.2 10*3/uL (ref 0.0–0.7)
Eosinophils Relative: 2.9 % (ref 0.0–5.0)
HEMATOCRIT: 41.1 % (ref 36.0–46.0)
Hemoglobin: 13.9 g/dL (ref 12.0–15.0)
LYMPHS ABS: 2.4 10*3/uL (ref 0.7–4.0)
Lymphocytes Relative: 36 % (ref 12.0–46.0)
MCHC: 33.8 g/dL (ref 30.0–36.0)
MCV: 93.7 fl (ref 78.0–100.0)
MONO ABS: 0.4 10*3/uL (ref 0.1–1.0)
Monocytes Relative: 6 % (ref 3.0–12.0)
NEUTROS PCT: 53.8 % (ref 43.0–77.0)
Neutro Abs: 3.5 10*3/uL (ref 1.4–7.7)
PLATELETS: 264 10*3/uL (ref 150.0–400.0)
RBC: 4.39 Mil/uL (ref 3.87–5.11)
RDW: 14.1 % (ref 11.5–14.6)
WBC: 6.6 10*3/uL (ref 4.5–10.5)

## 2013-12-21 LAB — COMPREHENSIVE METABOLIC PANEL
ALBUMIN: 4.4 g/dL (ref 3.5–5.2)
ALT: 16 U/L (ref 0–35)
AST: 20 U/L (ref 0–37)
Alkaline Phosphatase: 69 U/L (ref 39–117)
BILIRUBIN TOTAL: 0.5 mg/dL (ref 0.3–1.2)
BUN: 29 mg/dL — ABNORMAL HIGH (ref 6–23)
CO2: 27 meq/L (ref 19–32)
Calcium: 9.6 mg/dL (ref 8.4–10.5)
Chloride: 108 mEq/L (ref 96–112)
Creatinine, Ser: 0.9 mg/dL (ref 0.4–1.2)
GFR: 65.59 mL/min (ref >60.00)
GLUCOSE: 107 mg/dL — AB (ref 70–99)
POTASSIUM: 4.3 meq/L (ref 3.5–5.1)
SODIUM: 142 meq/L (ref 135–145)
TOTAL PROTEIN: 7.3 g/dL (ref 6.0–8.3)

## 2013-12-21 LAB — T3, FREE: T3, Free: 2.8 pg/mL (ref 2.3–4.2)

## 2013-12-21 LAB — HEMOGLOBIN A1C: Hgb A1c MFr Bld: 5.3 % (ref 4.6–6.5)

## 2013-12-21 LAB — TSH: TSH: 4.64 u[IU]/mL (ref 0.35–5.50)

## 2013-12-21 LAB — CORTISOL: CORTISOL PLASMA: 10 ug/dL

## 2013-12-21 MED ORDER — OLMESARTAN MEDOXOMIL 40 MG PO TABS: 40.0000 mg | ORAL_TABLET | Freq: Every day | ORAL | Status: DC

## 2013-12-21 MED ORDER — KETOCONAZOLE 2 % EX CREA: 1.0000 "application " | TOPICAL_CREAM | Freq: Two times a day (BID) | CUTANEOUS | Status: DC

## 2013-12-21 NOTE — Assessment & Plan Note (Signed)
I will recheck her A1C today 

## 2013-12-21 NOTE — Progress Notes (Signed)
Subjective:    Patient ID: Jade [name], female    DOB: [birth year], 58 y.o.   MRN: [id number]  HPI Comments: She returns for f/up and though she has lost 4 pounds since her last visit she is still frustrated about her weight and she asks that I do labs today to check her metabolism (leptin, cortisol, TFT's) as she has been reading a book about metabolism.     Review of Systems  Constitutional: Positive for fatigue. Negative for fever, chills, diaphoresis, activity change, appetite change and unexpected weight change.  HENT: Negative.   Eyes: Negative.   Respiratory: Negative.  Negative for apnea, cough, choking, chest tightness, shortness of breath, wheezing and stridor.   Cardiovascular: Negative.  Negative for chest pain, palpitations and leg swelling.  Gastrointestinal: Negative.  Negative for nausea, vomiting, abdominal pain, constipation and blood in stool.  Endocrine: Negative.   Genitourinary: Negative.   Musculoskeletal: Negative.   Skin: Positive for rash. Negative for color change, pallor and wound.       She has red irritated areas in her axilla  Allergic/Immunologic: Negative.   Neurological: Negative.  Negative for dizziness, tremors, facial asymmetry, weakness, light-headedness, numbness and headaches.  Hematological: Negative.  Negative for adenopathy. Does not bruise/bleed easily.  Psychiatric/Behavioral: Positive for sleep disturbance (DFA and FA). Negative for suicidal ideas, dysphoric mood, decreased concentration and agitation. The patient is not nervous/anxious.        Objective:   Physical Exam  Vitals reviewed. Constitutional: She is oriented to person, place, and time. She appears well-developed and well-nourished. No distress.  HENT:  Head: Normocephalic and atraumatic.  Mouth/Throat: Oropharynx is clear and moist. No oropharyngeal exudate.  Eyes: Conjunctivae are normal. Right eye exhibits no discharge. Left eye exhibits no discharge. No scleral  icterus.  Neck: Normal range of motion. Neck supple. No JVD present. No tracheal deviation present. No thyromegaly present.  Cardiovascular: Normal rate, regular rhythm, normal heart sounds and intact distal pulses.  Exam reveals no gallop and no friction rub.   No murmur heard. Pulmonary/Chest: Effort normal and breath sounds normal. No stridor. No respiratory distress. She has no wheezes. She has no rales. She exhibits no tenderness.  Abdominal: Soft. Bowel sounds are normal. She exhibits no distension and no mass. There is no tenderness. There is no rebound and no guarding.  Musculoskeletal: Normal range of motion. She exhibits no edema and no tenderness.  Lymphadenopathy:    She has no cervical adenopathy.  Neurological: She is oriented to person, place, and time.  Skin: Skin is warm, dry and intact. Rash noted. No abrasion, no bruising, no burn, no ecchymosis, no laceration, no lesion, no petechiae and no purpura noted. Rash is not macular, not papular, not maculopapular, not nodular, not pustular, not vesicular and not urticarial. She is not diaphoretic. There is erythema.  In both axillae there is erythema in the folds  Psychiatric: She has a normal mood and affect. Her behavior is normal. Judgment and thought content normal.     Lab Results  Component Value Date   WBC 9.5 09/26/2012   HGB 13.6 09/26/2012   HCT 39.7 09/26/2012   PLT 356.0 09/26/2012   GLUCOSE 112* 08/24/2013   CHOL 227* 08/24/2013   TRIG 78.0 08/24/2013   HDL 58.40 08/24/2013   LDLDIRECT 164.7 08/24/2013   LDLCALC  Value: 87        Total Cholesterol/HDL:CHD Risk Coronary Heart Disease Risk Table  Men   Women  1/2 Average Risk   3.4   3.3  Average Risk       5.0   4.4  2 X Average Risk   9.6   7.1  3 X Average Risk  23.4   11.0        Use the calculated Patient Ratio above and the CHD Risk Table to determine the patient's CHD Risk.        ATP III CLASSIFICATION (LDL):  <100     mg/dL   Optimal  161-096   mg/dL   Near or Above                    Optimal  130-159  mg/dL   Borderline  045-409  mg/dL   High  >811     mg/dL   Very High 91/47/8295   ALT 16 08/24/2013   AST 23 08/24/2013   NA 144 08/24/2013   K 3.7 08/24/2013   CL 104 08/24/2013   CREATININE 1.1 08/24/2013   BUN 31* 08/24/2013   CO2 26 08/24/2013   TSH 5.34 08/24/2013   HGBA1C 5.8 08/24/2013       Assessment & Plan:

## 2013-12-21 NOTE — Assessment & Plan Note (Signed)
Will start treating this with an ARB Will check her labs today to look for end organ damage and secondary causes of HTN

## 2013-12-21 NOTE — Assessment & Plan Note (Signed)
Will treat with ketoconazole cream 

## 2013-12-21 NOTE — Patient Instructions (Signed)

## 2013-12-21 NOTE — Progress Notes (Signed)
Pre visit review using our clinic review tool, if applicable. No additional management support is needed unless otherwise documented below in the visit note. 

## 2013-12-21 NOTE — Assessment & Plan Note (Signed)
Pt-requested labs were done

## 2013-12-22 ENCOUNTER — Telehealth: Payer: Self-pay | Admitting: Internal Medicine

## 2013-12-22 LAB — T4: T4 TOTAL: 10.3 ug/dL (ref 5.0–12.5)

## 2013-12-22 NOTE — Telephone Encounter (Signed)
Relevant patient education assigned to patient using Emmi. ° °

## 2013-12-24 LAB — LEPTIN: Leptin, Serum: 11.6 ng/mL

## 2013-12-27 ENCOUNTER — Encounter: Payer: Self-pay | Admitting: Internal Medicine

## 2014-06-28 ENCOUNTER — Encounter: Payer: Self-pay | Admitting: Internal Medicine

## 2014-08-09 ENCOUNTER — Telehealth: Payer: Self-pay | Admitting: Internal Medicine

## 2014-08-09 NOTE — Telephone Encounter (Signed)
Pt called stated that medicare is not going pay for test strip unless Dr. Yetta BarreJones call [facility] and give them code so medicare will pay for pt. Please advise.

## 2014-08-30 ENCOUNTER — Other Ambulatory Visit (INDEPENDENT_AMBULATORY_CARE_PROVIDER_SITE_OTHER): Payer: Commercial Managed Care - HMO

## 2014-08-30 ENCOUNTER — Encounter: Payer: Self-pay | Admitting: Internal Medicine

## 2014-08-30 ENCOUNTER — Ambulatory Visit (INDEPENDENT_AMBULATORY_CARE_PROVIDER_SITE_OTHER): Payer: Commercial Managed Care - HMO | Admitting: Internal Medicine

## 2014-08-30 VITALS — BP 140/82 | HR 60 | Temp 97.6°F | Resp 16 | Ht 65.05 in | Wt 176.0 lb

## 2014-08-30 DIAGNOSIS — Z1231 Encounter for screening mammogram for malignant neoplasm of breast: Secondary | ICD-10-CM

## 2014-08-30 DIAGNOSIS — Z0000 Encounter for general adult medical examination without abnormal findings: Secondary | ICD-10-CM

## 2014-08-30 DIAGNOSIS — E1140 Type 2 diabetes mellitus with diabetic neuropathy, unspecified: Secondary | ICD-10-CM | POA: Diagnosis not present

## 2014-08-30 DIAGNOSIS — E7800 Pure hypercholesterolemia, unspecified: Secondary | ICD-10-CM

## 2014-08-30 DIAGNOSIS — I10 Essential (primary) hypertension: Secondary | ICD-10-CM | POA: Diagnosis not present

## 2014-08-30 DIAGNOSIS — R5383 Other fatigue: Secondary | ICD-10-CM

## 2014-08-30 DIAGNOSIS — E1149 Type 2 diabetes mellitus with other diabetic neurological complication: Secondary | ICD-10-CM

## 2014-08-30 DIAGNOSIS — Z23 Encounter for immunization: Secondary | ICD-10-CM

## 2014-08-30 LAB — CBC WITH DIFFERENTIAL/PLATELET
BASOS ABS: 0 10*3/uL (ref 0.0–0.1)
Basophils Relative: 0.3 % (ref 0.0–3.0)
Eosinophils Absolute: 0.2 10*3/uL (ref 0.0–0.7)
Eosinophils Relative: 2 % (ref 0.0–5.0)
HCT: 43.6 % (ref 36.0–46.0)
HEMOGLOBIN: 14.5 g/dL (ref 12.0–15.0)
LYMPHS PCT: 26.5 % (ref 12.0–46.0)
Lymphs Abs: 2.8 10*3/uL (ref 0.7–4.0)
MCHC: 33.3 g/dL (ref 30.0–36.0)
MCV: 95.8 fl (ref 78.0–100.0)
MONOS PCT: 5.8 % (ref 3.0–12.0)
Monocytes Absolute: 0.6 10*3/uL (ref 0.1–1.0)
Neutro Abs: 6.9 10*3/uL (ref 1.4–7.7)
Neutrophils Relative %: 65.4 % (ref 43.0–77.0)
PLATELETS: 305 10*3/uL (ref 150.0–400.0)
RBC: 4.55 Mil/uL (ref 3.87–5.11)
RDW: 13.5 % (ref 11.5–15.5)
WBC: 10.5 10*3/uL (ref 4.0–10.5)

## 2014-08-30 LAB — URINALYSIS, ROUTINE W REFLEX MICROSCOPIC
Bilirubin Urine: NEGATIVE
Hgb urine dipstick: NEGATIVE
Ketones, ur: NEGATIVE
Nitrite: NEGATIVE
Specific Gravity, Urine: 1.01 (ref 1.000–1.030)
Total Protein, Urine: NEGATIVE
UROBILINOGEN UA: 0.2 (ref 0.0–1.0)
Urine Glucose: NEGATIVE
WBC UA: NONE SEEN (ref 0–?)
pH: 6 (ref 5.0–8.0)

## 2014-08-30 LAB — COMPREHENSIVE METABOLIC PANEL
ALT: 15 U/L (ref 0–35)
AST: 22 U/L (ref 0–37)
Albumin: 4.6 g/dL (ref 3.5–5.2)
Alkaline Phosphatase: 56 U/L (ref 39–117)
BILIRUBIN TOTAL: 0.8 mg/dL (ref 0.2–1.2)
BUN: 25 mg/dL — ABNORMAL HIGH (ref 6–23)
CALCIUM: 9.7 mg/dL (ref 8.4–10.5)
CHLORIDE: 104 meq/L (ref 96–112)
CO2: 25 mEq/L (ref 19–32)
Creatinine, Ser: 1 mg/dL (ref 0.4–1.2)
GFR: 60.88 mL/min (ref >60.00)
Glucose, Bld: 99 mg/dL (ref 70–99)
Potassium: 3.9 mEq/L (ref 3.5–5.1)
Sodium: 140 mEq/L (ref 135–145)
Total Protein: 7.5 g/dL (ref 6.0–8.3)

## 2014-08-30 LAB — LIPID PANEL
CHOLESTEROL: 229 mg/dL — AB (ref 0–200)
HDL: 76 mg/dL (ref >39.00)
LDL CALC: 136 mg/dL — AB (ref 0–99)
NONHDL: 153
Total CHOL/HDL Ratio: 3
Triglycerides: 84 mg/dL (ref 0.0–149.0)
VLDL: 16.8 mg/dL (ref 0.0–40.0)

## 2014-08-30 LAB — MICROALBUMIN / CREATININE URINE RATIO
CREATININE, U: 57.4 mg/dL
MICROALB UR: 0.6 mg/dL (ref 0.0–1.9)
Microalb Creat Ratio: 1 mg/g (ref 0.0–30.0)

## 2014-08-30 LAB — T4: T4, Total: 9.6 ug/dL (ref 4.5–12.0)

## 2014-08-30 LAB — HEMOGLOBIN A1C: Hgb A1c MFr Bld: 5.7 % (ref 4.6–6.5)

## 2014-08-30 LAB — T3, FREE: T3 FREE: 2.4 pg/mL (ref 2.3–4.2)

## 2014-08-30 LAB — TSH: TSH: 3.96 u[IU]/mL (ref 0.35–4.50)

## 2014-08-30 LAB — T4, FREE: Free T4: 1.12 ng/dL (ref 0.60–1.60)

## 2014-08-30 MED ORDER — TETANUS-DIPHTH-ACELL PERTUSSIS 5-2.5-18.5 LF-MCG/0.5 IM SUSP
0.5000 mL | Freq: Once | INTRAMUSCULAR | Status: AC
  Administered 2014-08-30: 0.5 mL via INTRAMUSCULAR

## 2014-08-30 NOTE — Assessment & Plan Note (Signed)
I will check her labs today to screen for secondary causes

## 2014-08-30 NOTE — Patient Instructions (Signed)
Preventive Care for Adults A healthy lifestyle and preventive care can promote health and wellness. Preventive health guidelines for women include the following key practices.  A routine yearly physical is a good way to check with your health care provider about your health and preventive screening. It is a chance to share any concerns and updates on your health and to receive a thorough exam.  Visit your dentist for a routine exam and preventive care every 6 months. Brush your teeth twice a day and floss once a day. Good oral hygiene prevents tooth decay and gum disease.  The frequency of eye exams is based on your age, health, family medical history, use of contact lenses, and other factors. Follow your health care provider's recommendations for frequency of eye exams.  Eat a healthy diet. Foods like vegetables, fruits, whole grains, low-fat dairy products, and lean protein foods contain the nutrients you need without too many calories. Decrease your intake of foods high in solid fats, added sugars, and salt. Eat the right amount of calories for you.Get information about a proper diet from your health care provider, if necessary.  Regular physical exercise is one of the most important things you can do for your health. Most adults should get at least 150 minutes of moderate-intensity exercise (any activity that increases your heart rate and causes you to sweat) each week. In addition, most adults need muscle-strengthening exercises on 2 or more days a week.  Maintain a healthy weight. The body mass index (BMI) is a screening tool to identify possible weight problems. It provides an estimate of body fat based on height and weight. Your health care provider can find your BMI and can help you achieve or maintain a healthy weight.For adults 20 years and older:  A BMI below 18.5 is considered underweight.  A BMI of 18.5 to 24.9 is normal.  A BMI of 25 to 29.9 is considered overweight.  A BMI of  30 and above is considered obese.  Maintain normal blood lipids and cholesterol levels by exercising and minimizing your intake of saturated fat. Eat a balanced diet with plenty of fruit and vegetables. Blood tests for lipids and cholesterol should begin at age 76 and be repeated every 5 years. If your lipid or cholesterol levels are high, you are over 50, or you are at high risk for heart disease, you may need your cholesterol levels checked more frequently.Ongoing high lipid and cholesterol levels should be treated with medicines if diet and exercise are not working.  If you smoke, find out from your health care provider how to quit. If you do not use tobacco, do not start.  Lung cancer screening is recommended for adults aged 22-80 years who are at high risk for developing lung cancer because of a history of smoking. A yearly low-dose CT scan of the lungs is recommended for people who have at least a 30-pack-year history of smoking and are a current smoker or have quit within the past 15 years. A pack year of smoking is smoking an average of 1 pack of cigarettes a day for 1 year (for example: 1 pack a day for 30 years or 2 packs a day for 15 years). Yearly screening should continue until the smoker has stopped smoking for at least 15 years. Yearly screening should be stopped for people who develop a health problem that would prevent them from having lung cancer treatment.  If you are pregnant, do not drink alcohol. If you are breastfeeding,  be very cautious about drinking alcohol. If you are not pregnant and choose to drink alcohol, do not have more than 1 drink per day. One drink is considered to be 12 ounces (355 mL) of beer, 5 ounces (148 mL) of wine, or 1.5 ounces (44 mL) of liquor.  Avoid use of street drugs. Do not share needles with anyone. Ask for help if you need support or instructions about stopping the use of drugs.  High blood pressure causes heart disease and increases the risk of  stroke. Your blood pressure should be checked at least every 1 to 2 years. Ongoing high blood pressure should be treated with medicines if weight loss and exercise do not work.  If you are 75-52 years old, ask your health care provider if you should take aspirin to prevent strokes.  Diabetes screening involves taking a blood sample to check your fasting blood sugar level. This should be done once every 3 years, after age 15, if you are within normal weight and without risk factors for diabetes. Testing should be considered at a younger age or be carried out more frequently if you are overweight and have at least 1 risk factor for diabetes.  Breast cancer screening is essential preventive care for women. You should practice "breast self-awareness." This means understanding the normal appearance and feel of your breasts and may include breast self-examination. Any changes detected, no matter how small, should be reported to a health care provider. Women in their 58s and 30s should have a clinical breast exam (CBE) by a health care provider as part of a regular health exam every 1 to 3 years. After age 16, women should have a CBE every year. Starting at age 53, women should consider having a mammogram (breast X-ray test) every year. Women who have a family history of breast cancer should talk to their health care provider about genetic screening. Women at a high risk of breast cancer should talk to their health care providers about having an MRI and a mammogram every year.  Breast cancer gene (BRCA)-related cancer risk assessment is recommended for women who have family members with BRCA-related cancers. BRCA-related cancers include breast, ovarian, tubal, and peritoneal cancers. Having family members with these cancers may be associated with an increased risk for harmful changes (mutations) in the breast cancer genes BRCA1 and BRCA2. Results of the assessment will determine the need for genetic counseling and  BRCA1 and BRCA2 testing.  Routine pelvic exams to screen for cancer are no longer recommended for nonpregnant women who are considered low risk for cancer of the pelvic organs (ovaries, uterus, and vagina) and who do not have symptoms. Ask your health care provider if a screening pelvic exam is right for you.  If you have had past treatment for cervical cancer or a condition that could lead to cancer, you need Pap tests and screening for cancer for at least 20 years after your treatment. If Pap tests have been discontinued, your risk factors (such as having a new sexual partner) need to be reassessed to determine if screening should be resumed. Some women have medical problems that increase the chance of getting cervical cancer. In these cases, your health care provider may recommend more frequent screening and Pap tests.  The HPV test is an additional test that may be used for cervical cancer screening. The HPV test looks for the virus that can cause the cell changes on the cervix. The cells collected during the Pap test can be  tested for HPV. The HPV test could be used to screen women aged 30 years and older, and should be used in women of any age who have unclear Pap test results. After the age of 30, women should have HPV testing at the same frequency as a Pap test.  Colorectal cancer can be detected and often prevented. Most routine colorectal cancer screening begins at the age of 50 years and continues through age 75 years. However, your health care provider may recommend screening at an earlier age if you have risk factors for colon cancer. On a yearly basis, your health care provider may provide home test kits to check for hidden blood in the stool. Use of a small camera at the end of a tube, to directly examine the colon (sigmoidoscopy or colonoscopy), can detect the earliest forms of colorectal cancer. Talk to your health care provider about this at age 50, when routine screening begins. Direct  exam of the colon should be repeated every 5-10 years through age 75 years, unless early forms of pre-cancerous polyps or small growths are found.  People who are at an increased risk for hepatitis B should be screened for this virus. You are considered at high risk for hepatitis B if:  You were born in a country where hepatitis B occurs often. Talk with your health care provider about which countries are considered high risk.  Your parents were born in a high-risk country and you have not received a shot to protect against hepatitis B (hepatitis B vaccine).  You have HIV or AIDS.  You use needles to inject street drugs.  You live with, or have sex with, someone who has hepatitis B.  You get hemodialysis treatment.  You take certain medicines for conditions like cancer, organ transplantation, and autoimmune conditions.  Hepatitis C blood testing is recommended for all people born from 1945 through 1965 and any individual with known risks for hepatitis C.  Practice safe sex. Use condoms and avoid high-risk sexual practices to reduce the spread of sexually transmitted infections (STIs). STIs include gonorrhea, chlamydia, syphilis, trichomonas, herpes, HPV, and human immunodeficiency virus (HIV). Herpes, HIV, and HPV are viral illnesses that have no cure. They can result in disability, cancer, and death.  You should be screened for sexually transmitted illnesses (STIs) including gonorrhea and chlamydia if:  You are sexually active and are younger than 24 years.  You are older than 24 years and your health care provider tells you that you are at risk for this type of infection.  Your sexual activity has changed since you were last screened and you are at an increased risk for chlamydia or gonorrhea. Ask your health care provider if you are at risk.  If you are at risk of being infected with HIV, it is recommended that you take a prescription medicine daily to prevent HIV infection. This is  called preexposure prophylaxis (PrEP). You are considered at risk if:  You are a heterosexual woman, are sexually active, and are at increased risk for HIV infection.  You take drugs by injection.  You are sexually active with a partner who has HIV.  Talk with your health care provider about whether you are at high risk of being infected with HIV. If you choose to begin PrEP, you should first be tested for HIV. You should then be tested every 3 months for as long as you are taking PrEP.  Osteoporosis is a disease in which the bones lose minerals and strength   with aging. This can result in serious bone fractures or breaks. The risk of osteoporosis can be identified using a bone density scan. Women ages 65 years and over and women at risk for fractures or osteoporosis should discuss screening with their health care providers. Ask your health care provider whether you should take a calcium supplement or vitamin D to reduce the rate of osteoporosis.  Menopause can be associated with physical symptoms and risks. Hormone replacement therapy is available to decrease symptoms and risks. You should talk to your health care provider about whether hormone replacement therapy is right for you.  Use sunscreen. Apply sunscreen liberally and repeatedly throughout the day. You should seek shade when your shadow is shorter than you. Protect yourself by wearing long sleeves, pants, a wide-brimmed hat, and sunglasses year round, whenever you are outdoors.  Once a month, do a whole body skin exam, using a mirror to look at the skin on your back. Tell your health care provider of new moles, moles that have irregular borders, moles that are larger than a pencil eraser, or moles that have changed in shape or color.  Stay current with required vaccines (immunizations).  Influenza vaccine. All adults should be immunized every year.  Tetanus, diphtheria, and acellular pertussis (Td, Tdap) vaccine. Pregnant women should  receive 1 dose of Tdap vaccine during each pregnancy. The dose should be obtained regardless of the length of time since the last dose. Immunization is preferred during the 27th-36th week of gestation. An adult who has not previously received Tdap or who does not know her vaccine status should receive 1 dose of Tdap. This initial dose should be followed by tetanus and diphtheria toxoids (Td) booster doses every 10 years. Adults with an unknown or incomplete history of completing a 3-dose immunization series with Td-containing vaccines should begin or complete a primary immunization series including a Tdap dose. Adults should receive a Td booster every 10 years.  Varicella vaccine. An adult without evidence of immunity to varicella should receive 2 doses or a second dose if she has previously received 1 dose. Pregnant females who do not have evidence of immunity should receive the first dose after pregnancy. This first dose should be obtained before leaving the health care facility. The second dose should be obtained 4-8 weeks after the first dose.  Human papillomavirus (HPV) vaccine. Females aged 13-26 years who have not received the vaccine previously should obtain the 3-dose series. The vaccine is not recommended for use in pregnant females. However, pregnancy testing is not needed before receiving a dose. If a female is found to be pregnant after receiving a dose, no treatment is needed. In that case, the remaining doses should be delayed until after the pregnancy. Immunization is recommended for any person with an immunocompromised condition through the age of 26 years if she did not get any or all doses earlier. During the 3-dose series, the second dose should be obtained 4-8 weeks after the first dose. The third dose should be obtained 24 weeks after the first dose and 16 weeks after the second dose.  Zoster vaccine. One dose is recommended for adults aged 60 years or older unless certain conditions are  present.  Measles, mumps, and rubella (MMR) vaccine. Adults born before 1957 generally are considered immune to measles and mumps. Adults born in 1957 or later should have 1 or more doses of MMR vaccine unless there is a contraindication to the vaccine or there is laboratory evidence of immunity to   each of the three diseases. A routine second dose of MMR vaccine should be obtained at least 28 days after the first dose for students attending postsecondary schools, health care workers, or international travelers. People who received inactivated measles vaccine or an unknown type of measles vaccine during 1963-1967 should receive 2 doses of MMR vaccine. People who received inactivated mumps vaccine or an unknown type of mumps vaccine before 1979 and are at high risk for mumps infection should consider immunization with 2 doses of MMR vaccine. For females of childbearing age, rubella immunity should be determined. If there is no evidence of immunity, females who are not pregnant should be vaccinated. If there is no evidence of immunity, females who are pregnant should delay immunization until after pregnancy. Unvaccinated health care workers born before 1957 who lack laboratory evidence of measles, mumps, or rubella immunity or laboratory confirmation of disease should consider measles and mumps immunization with 2 doses of MMR vaccine or rubella immunization with 1 dose of MMR vaccine.  Pneumococcal 13-valent conjugate (PCV13) vaccine. When indicated, a person who is uncertain of her immunization history and has no record of immunization should receive the PCV13 vaccine. An adult aged 19 years or older who has certain medical conditions and has not been previously immunized should receive 1 dose of PCV13 vaccine. This PCV13 should be followed with a dose of pneumococcal polysaccharide (PPSV23) vaccine. The PPSV23 vaccine dose should be obtained at least 8 weeks after the dose of PCV13 vaccine. An adult aged 19  years or older who has certain medical conditions and previously received 1 or more doses of PPSV23 vaccine should receive 1 dose of PCV13. The PCV13 vaccine dose should be obtained 1 or more years after the last PPSV23 vaccine dose.  Pneumococcal polysaccharide (PPSV23) vaccine. When PCV13 is also indicated, PCV13 should be obtained first. All adults aged 65 years and older should be immunized. An adult younger than age 65 years who has certain medical conditions should be immunized. Any person who resides in a nursing home or long-term care facility should be immunized. An adult smoker should be immunized. People with an immunocompromised condition and certain other conditions should receive both PCV13 and PPSV23 vaccines. People with human immunodeficiency virus (HIV) infection should be immunized as soon as possible after diagnosis. Immunization during chemotherapy or radiation therapy should be avoided. Routine use of PPSV23 vaccine is not recommended for American Indians, [place] Natives, or people younger than 65 years unless there are medical conditions that require PPSV23 vaccine. When indicated, people who have unknown immunization and have no record of immunization should receive PPSV23 vaccine. One-time revaccination 5 years after the first dose of PPSV23 is recommended for people aged 19-64 years who have chronic kidney failure, nephrotic syndrome, asplenia, or immunocompromised conditions. People who received 1-2 doses of PPSV23 before age 65 years should receive another dose of PPSV23 vaccine at age 65 years or later if at least 5 years have passed since the previous dose. Doses of PPSV23 are not needed for people immunized with PPSV23 at or after age 65 years.  Meningococcal vaccine. Adults with asplenia or persistent complement component deficiencies should receive 2 doses of quadrivalent meningococcal conjugate (MenACWY-D) vaccine. The doses should be obtained at least 2 months apart.  Microbiologists working with certain meningococcal bacteria, military recruits, people at risk during an outbreak, and people who travel to or live in countries with a high rate of meningitis should be immunized. A first-year college student up through age   21 years who is living in a residence hall should receive a dose if she did not receive a dose on or after her 16th birthday. Adults who have certain high-risk conditions should receive one or more doses of vaccine.  Hepatitis A vaccine. Adults who wish to be protected from this disease, have certain high-risk conditions, work with hepatitis A-infected animals, work in hepatitis A research labs, or travel to or work in countries with a high rate of hepatitis A should be immunized. Adults who were previously unvaccinated and who anticipate close contact with an international adoptee during the first 60 days after arrival in the [place] from a country with a high rate of hepatitis A should be immunized.  Hepatitis B vaccine. Adults who wish to be protected from this disease, have certain high-risk conditions, may be exposed to blood or other infectious body fluids, are household contacts or sex partners of hepatitis B positive people, are clients or workers in certain care facilities, or travel to or work in countries with a high rate of hepatitis B should be immunized.  Haemophilus influenzae type b (Hib) vaccine. A previously unvaccinated person with asplenia or sickle cell disease or having a scheduled splenectomy should receive 1 dose of Hib vaccine. Regardless of previous immunization, a recipient of a hematopoietic stem cell transplant should receive a 3-dose series 6-12 months after her successful transplant. Hib vaccine is not recommended for adults with HIV infection. Preventive Services / Frequency Ages 64 to 68 years  Blood pressure check.** / Every 1 to 2 years.  Lipid and cholesterol check.** / Every 5 years beginning at age  22.  Clinical breast exam.** / Every 3 years for women in their 88s and 53s.  BRCA-related cancer risk assessment.** / For women who have family members with a BRCA-related cancer (breast, ovarian, tubal, or peritoneal cancers).  Pap test.** / Every 2 years from ages 90 through 51. Every 3 years starting at age 21 through age 56 or 3 with a history of 3 consecutive normal Pap tests.  HPV screening.** / Every 3 years from ages 24 through ages 1 to 46 with a history of 3 consecutive normal Pap tests.  Hepatitis C blood test.** / For any individual with known risks for hepatitis C.  Skin self-exam. / Monthly.  Influenza vaccine. / Every year.  Tetanus, diphtheria, and acellular pertussis (Tdap, Td) vaccine.** / Consult your health care provider. Pregnant women should receive 1 dose of Tdap vaccine during each pregnancy. 1 dose of Td every 10 years.  Varicella vaccine.** / Consult your health care provider. Pregnant females who do not have evidence of immunity should receive the first dose after pregnancy.  HPV vaccine. / 3 doses over 6 months, if 72 and younger. The vaccine is not recommended for use in pregnant females. However, pregnancy testing is not needed before receiving a dose.  Measles, mumps, rubella (MMR) vaccine.** / You need at least 1 dose of MMR if you were born in 1957 or later. You may also need a 2nd dose. For females of childbearing age, rubella immunity should be determined. If there is no evidence of immunity, females who are not pregnant should be vaccinated. If there is no evidence of immunity, females who are pregnant should delay immunization until after pregnancy.  Pneumococcal 13-valent conjugate (PCV13) vaccine.** / Consult your health care provider.  Pneumococcal polysaccharide (PPSV23) vaccine.** / 1 to 2 doses if you smoke cigarettes or if you have certain conditions.  Meningococcal vaccine.** /  1 dose if you are age 19 to 21 years and a first-year college  student living in a residence hall, or have one of several medical conditions, you need to get vaccinated against meningococcal disease. You may also need additional booster doses.  Hepatitis A vaccine.** / Consult your health care provider.  Hepatitis B vaccine.** / Consult your health care provider.  Haemophilus influenzae type b (Hib) vaccine.** / Consult your health care provider. Ages 40 to 64 years  Blood pressure check.** / Every 1 to 2 years.  Lipid and cholesterol check.** / Every 5 years beginning at age 20 years.  Lung cancer screening. / Every year if you are aged 55-80 years and have a 30-pack-year history of smoking and currently smoke or have quit within the past 15 years. Yearly screening is stopped once you have quit smoking for at least 15 years or develop a health problem that would prevent you from having lung cancer treatment.  Clinical breast exam.** / Every year after age 40 years.  BRCA-related cancer risk assessment.** / For women who have family members with a BRCA-related cancer (breast, ovarian, tubal, or peritoneal cancers).  Mammogram.** / Every year beginning at age 40 years and continuing for as long as you are in good health. Consult with your health care provider.  Pap test.** / Every 3 years starting at age 30 years through age 65 or 70 years with a history of 3 consecutive normal Pap tests.  HPV screening.** / Every 3 years from ages 30 years through ages 65 to 70 years with a history of 3 consecutive normal Pap tests.  Fecal occult blood test (FOBT) of stool. / Every year beginning at age 50 years and continuing until age 75 years. You may not need to do this test if you get a colonoscopy every 10 years.  Flexible sigmoidoscopy or colonoscopy.** / Every 5 years for a flexible sigmoidoscopy or every 10 years for a colonoscopy beginning at age 50 years and continuing until age 75 years.  Hepatitis C blood test.** / For all people born from 1945 through  1965 and any individual with known risks for hepatitis C.  Skin self-exam. / Monthly.  Influenza vaccine. / Every year.  Tetanus, diphtheria, and acellular pertussis (Tdap/Td) vaccine.** / Consult your health care provider. Pregnant women should receive 1 dose of Tdap vaccine during each pregnancy. 1 dose of Td every 10 years.  Varicella vaccine.** / Consult your health care provider. Pregnant females who do not have evidence of immunity should receive the first dose after pregnancy.  Zoster vaccine.** / 1 dose for adults aged 60 years or older.  Measles, mumps, rubella (MMR) vaccine.** / You need at least 1 dose of MMR if you were born in 1957 or later. You may also need a 2nd dose. For females of childbearing age, rubella immunity should be determined. If there is no evidence of immunity, females who are not pregnant should be vaccinated. If there is no evidence of immunity, females who are pregnant should delay immunization until after pregnancy.  Pneumococcal 13-valent conjugate (PCV13) vaccine.** / Consult your health care provider.  Pneumococcal polysaccharide (PPSV23) vaccine.** / 1 to 2 doses if you smoke cigarettes or if you have certain conditions.  Meningococcal vaccine.** / Consult your health care provider.  Hepatitis A vaccine.** / Consult your health care provider.  Hepatitis B vaccine.** / Consult your health care provider.  Haemophilus influenzae type b (Hib) vaccine.** / Consult your health care provider. Ages 65   years and over  Blood pressure check.** / Every 1 to 2 years.  Lipid and cholesterol check.** / Every 5 years beginning at age 22 years.  Lung cancer screening. / Every year if you are aged 73-80 years and have a 30-pack-year history of smoking and currently smoke or have quit within the past 15 years. Yearly screening is stopped once you have quit smoking for at least 15 years or develop a health problem that would prevent you from having lung cancer  treatment.  Clinical breast exam.** / Every year after age 4 years.  BRCA-related cancer risk assessment.** / For women who have family members with a BRCA-related cancer (breast, ovarian, tubal, or peritoneal cancers).  Mammogram.** / Every year beginning at age 40 years and continuing for as long as you are in good health. Consult with your health care provider.  Pap test.** / Every 3 years starting at age 9 years through age 34 or 91 years with 3 consecutive normal Pap tests. Testing can be stopped between 65 and 70 years with 3 consecutive normal Pap tests and no abnormal Pap or HPV tests in the past 10 years.  HPV screening.** / Every 3 years from ages 57 years through ages 64 or 45 years with a history of 3 consecutive normal Pap tests. Testing can be stopped between 65 and 70 years with 3 consecutive normal Pap tests and no abnormal Pap or HPV tests in the past 10 years.  Fecal occult blood test (FOBT) of stool. / Every year beginning at age 15 years and continuing until age 17 years. You may not need to do this test if you get a colonoscopy every 10 years.  Flexible sigmoidoscopy or colonoscopy.** / Every 5 years for a flexible sigmoidoscopy or every 10 years for a colonoscopy beginning at age 86 years and continuing until age 71 years.  Hepatitis C blood test.** / For all people born from [birth year] through 1966 and any individual with known risks for hepatitis C.  Osteoporosis screening.** / A one-time screening for women ages 83 years and over and women at risk for fractures or osteoporosis.  Skin self-exam. / Monthly.  Influenza vaccine. / Every year.  Tetanus, diphtheria, and acellular pertussis (Tdap/Td) vaccine.** / 1 dose of Td every 10 years.  Varicella vaccine.** / Consult your health care provider.  Zoster vaccine.** / 1 dose for adults aged 61 years or older.  Pneumococcal 13-valent conjugate (PCV13) vaccine.** / Consult your health care provider.  Pneumococcal  polysaccharide (PPSV23) vaccine.** / 1 dose for all adults aged 28 years and older.  Meningococcal vaccine.** / Consult your health care provider.  Hepatitis A vaccine.** / Consult your health care provider.  Hepatitis B vaccine.** / Consult your health care provider.  Haemophilus influenzae type b (Hib) vaccine.** / Consult your health care provider. ** Family history and personal history of risk and conditions may change your health care provider's recommendations. Document Released: 10/09/2001 Document Revised: 12/28/2013 Document Reviewed: 01/08/2011 [facility] Patient Information 2015 [place], [place]. This information is not intended to replace advice given to you by your health care provider. Make sure you discuss any questions you have with your health care provider.

## 2014-08-30 NOTE — Assessment & Plan Note (Signed)
She is due for a FLP She refuses to take a statin

## 2014-08-30 NOTE — Assessment & Plan Note (Addendum)
The patient is here for annual Medicare wellness examination and management of other chronic and acute problems.   The risk factors are reflected in the social history.  The roster of all physicians providing medical care to patient - is listed in the Snapshot section of the chart.  Activities of daily living:  The patient is 100% inedpendent in all ADLs: dressing, toileting, feeding as well as independent mobility  Home safety : The patient has smoke detectors in the home. They wear seatbelts.No firearms at home ( firearms are present in the home, kept in a safe fashion). There is no violence in the home.   There is no risks for hepatitis, STDs or HIV. There is no   history of blood transfusion. They have no travel history to infectious disease endemic areas of the world.  The patient has (has not) seen their dentist in the last six month. They have (not) seen their eye doctor in the last year. They deny (admit to) any hearing difficulty and have not had audiologic testing in the last year.  They do not  have excessive sun exposure. Discussed the need for sun protection: hats, long sleeves and use of sunscreen if there is significant sun exposure.   Diet: the importance of a healthy diet is discussed. They do have a healthy (unhealthy-high fat/fast food) diet.  The patient has a regular exercise program:.  The benefits of regular aerobic exercise were discussed.  Depression screen: there are no signs or vegative symptoms of depression- irritability, change in appetite, anhedonia, sadness/tearfullness.  Cognitive assessment: the patient manages all their financial and personal affairs and is actively engaged. They could relate day,date,year and events; recalled 3/3 objects at 3 minutes; performed clock-face test normally.  The following portions of the patient's history were reviewed and updated as appropriate: allergies, current medications, past family history, past medical history,  past  surgical history, past social history  and problem list.  Vision, hearing, body mass index were assessed and reviewed.   During the course of the visit the patient was educated and counseled about appropriate screening and preventive services including : fall prevention , diabetes screening, nutrition counseling, colorectal cancer screening, and recommended immunizations.  She refused vaccines for influenza and pneumonia today.

## 2014-08-30 NOTE — Assessment & Plan Note (Signed)
Her BP is well controlled I will monitor her lytes and renal function today 

## 2014-08-30 NOTE — Assessment & Plan Note (Signed)
Her blood sugars have been well controlled with diet and exercise She is due for an eye exam Will recheck her A1C today and will treat if needed

## 2014-08-30 NOTE — Progress Notes (Signed)
Subjective:    Patient ID: Jade [name], female    DOB: 1956/07/09, 59 y.o.   MRN: [id number]  Diabetes She presents for her follow-up diabetic visit. She has type 2 diabetes mellitus. Her disease course has been improving. There are no hypoglycemic associated symptoms. Pertinent negatives for hypoglycemia include no confusion, dizziness, headaches or nervousness/anxiousness. Associated symptoms include fatigue. Pertinent negatives for diabetes include no blurred vision, no chest pain, no foot ulcerations, no polydipsia, no polyphagia, no polyuria, no visual change, no weakness and no weight loss. There are no hypoglycemic complications. Symptoms are stable. Diabetic complications include peripheral neuropathy. Current diabetic treatment includes diet. She is compliant with treatment most of the time. Her weight is stable. She is following a generally healthy diet. Meal planning includes avoidance of concentrated sweets. She has not had a previous visit with a dietitian. She participates in exercise intermittently. There is no change in her home blood glucose trend. An ACE inhibitor/angiotensin II receptor blocker is not being taken. She does not see a podiatrist.Eye exam is not current.      Review of Systems  Constitutional: Positive for fatigue. Negative for fever, chills, weight loss, diaphoresis, activity change, appetite change and unexpected weight change.  HENT: Negative.   Eyes: Negative.  Negative for blurred vision.  Respiratory: Negative.  Negative for apnea, cough, choking, chest tightness, shortness of breath, wheezing and stridor.   Cardiovascular: Negative.  Negative for chest pain, palpitations and leg swelling.  Gastrointestinal: Positive for abdominal pain (she has chronic bilateral upper abd pain, R>L). Negative for nausea, vomiting, diarrhea, constipation, blood in stool, abdominal distention, anal bleeding and rectal pain.  Endocrine: Negative.  Negative for polydipsia,  polyphagia and polyuria.  Genitourinary: Negative.   Musculoskeletal: Negative.  Negative for myalgias, back pain, joint swelling and arthralgias.  Skin: Negative.  Negative for rash.  Allergic/Immunologic: Negative.   Neurological: Negative.  Negative for dizziness, weakness, light-headedness and headaches.  Hematological: Negative.  Negative for adenopathy. Does not bruise/bleed easily.  Psychiatric/Behavioral: Positive for sleep disturbance and dysphoric mood. Negative for suicidal ideas, hallucinations, behavioral problems, confusion, self-injury, decreased concentration and agitation. The patient is not nervous/anxious and is not hyperactive.        Objective:   Physical Exam  Constitutional: She is oriented to person, place, and time. She appears well-developed and well-nourished. No distress.  HENT:  Head: Normocephalic and atraumatic.  Mouth/Throat: Oropharynx is clear and moist. No oropharyngeal exudate.  Eyes: Conjunctivae are normal. Right eye exhibits no discharge. Left eye exhibits no discharge. No scleral icterus.  Neck: Normal range of motion. Neck supple. No JVD present. No tracheal deviation present. No thyromegaly present.  Cardiovascular: Normal rate, regular rhythm, normal heart sounds and intact distal pulses.  Exam reveals no gallop and no friction rub.   No murmur heard. Pulmonary/Chest: Effort normal and breath sounds normal. No stridor. No respiratory distress. She has no wheezes. She has no rales. She exhibits no tenderness.  Abdominal: Soft. Normal appearance and bowel sounds are normal. She exhibits no distension and no mass. There is no hepatosplenomegaly, splenomegaly or hepatomegaly. There is no tenderness. There is no rebound, no guarding and no CVA tenderness. No hernia. Hernia confirmed negative in the ventral area, confirmed negative in the right inguinal area and confirmed negative in the left inguinal area.  Musculoskeletal: Normal range of motion. She  exhibits no edema or tenderness.  Lymphadenopathy:    She has no cervical adenopathy.  Neurological: She is oriented to person,  place, and time.  Skin: Skin is warm and dry. No rash noted. She is not diaphoretic. No erythema. No pallor.  Psychiatric: Her behavior is normal. Judgment and thought content normal.  Vitals reviewed.   Lab Results  Component Value Date   WBC 6.6 12/21/2013   HGB 13.9 12/21/2013   HCT 41.1 12/21/2013   PLT 264.0 12/21/2013   GLUCOSE 107* 12/21/2013   CHOL 227* 08/24/2013   TRIG 78.0 08/24/2013   HDL 58.40 08/24/2013   LDLDIRECT 164.7 08/24/2013   LDLCALC  06/17/2010    87        Total Cholesterol/HDL:CHD Risk Coronary Heart Disease Risk Table                     Men   Women  1/2 Average Risk   3.4   3.3  Average Risk       5.0   4.4  2 X Average Risk   9.6   7.1  3 X Average Risk  23.4   11.0        Use the calculated Patient Ratio above and the CHD Risk Table to determine the patient's CHD Risk.        ATP III CLASSIFICATION (LDL):  <100     mg/dL   Optimal  446-286  mg/dL   Near or Above                    Optimal  130-159  mg/dL   Borderline  381-771  mg/dL   High  >165     mg/dL   Very High   ALT 16 79/10/8331   AST 20 12/21/2013   NA 142 12/21/2013   K 4.3 12/21/2013   CL 108 12/21/2013   CREATININE 0.9 12/21/2013   BUN 29* 12/21/2013   CO2 27 12/21/2013   TSH 4.64 12/21/2013   HGBA1C 5.3 12/21/2013        Assessment & Plan:

## 2014-09-06 ENCOUNTER — Encounter: Payer: Self-pay | Admitting: Internal Medicine

## 2014-09-07 ENCOUNTER — Other Ambulatory Visit: Payer: Self-pay | Admitting: Internal Medicine

## 2014-09-07 DIAGNOSIS — R1013 Epigastric pain: Secondary | ICD-10-CM

## 2014-09-07 DIAGNOSIS — R5382 Chronic fatigue, unspecified: Secondary | ICD-10-CM

## 2014-09-07 DIAGNOSIS — E1149 Type 2 diabetes mellitus with other diabetic neurological complication: Secondary | ICD-10-CM

## 2014-09-07 DIAGNOSIS — G8929 Other chronic pain: Secondary | ICD-10-CM

## 2014-09-07 MED ORDER — GLUCOSE BLOOD VI STRP: ORAL_STRIP | Status: DC

## 2014-09-13 ENCOUNTER — Other Ambulatory Visit: Payer: Self-pay

## 2014-09-13 MED ORDER — GLUCOSE BLOOD VI STRP: ORAL_STRIP | Status: DC

## 2014-09-13 MED ORDER — ACCU-CHEK SMARTVIEW CONTROL VI LIQD: Status: DC

## 2014-09-13 MED ORDER — ACCU-CHEK NANO SMARTVIEW W/DEVICE KIT: PACK | Status: DC

## 2014-09-13 MED ORDER — ACCU-CHEK FASTCLIX LANCET KIT: PACK | Status: DC

## 2014-09-27 ENCOUNTER — Telehealth: Payer: Self-pay | Admitting: Internal Medicine

## 2014-09-27 ENCOUNTER — Other Ambulatory Visit: Payer: Self-pay | Admitting: Internal Medicine

## 2014-09-27 DIAGNOSIS — S8990XA Unspecified injury of unspecified lower leg, initial encounter: Secondary | ICD-10-CM

## 2014-09-27 DIAGNOSIS — M25562 Pain in left knee: Secondary | ICD-10-CM | POA: Insufficient documentation

## 2014-09-27 NOTE — Telephone Encounter (Signed)
done

## 2014-09-27 NOTE — Telephone Encounter (Signed)
Pt called in and said that she hurt her knee and wants to know if Dr Yetta Barre can refer her to:    [facility]  Dr  Lesia Hausen  Dr Jodi Geralds

## 2014-09-29 ENCOUNTER — Encounter: Payer: Self-pay | Admitting: Internal Medicine

## 2014-09-29 ENCOUNTER — Ambulatory Visit (INDEPENDENT_AMBULATORY_CARE_PROVIDER_SITE_OTHER): Payer: Commercial Managed Care - HMO | Admitting: Internal Medicine

## 2014-09-29 ENCOUNTER — Ambulatory Visit (INDEPENDENT_AMBULATORY_CARE_PROVIDER_SITE_OTHER)
Admission: RE | Admit: 2014-09-29 | Discharge: 2014-09-29 | Disposition: A | Discharge status: Home / Self Care | Payer: Commercial Managed Care - HMO | Source: Ambulatory Visit | Attending: Internal Medicine | Admitting: Internal Medicine

## 2014-09-29 VITALS — BP 118/68 | HR 76 | Temp 98.2°F | Resp 16 | Ht 65.05 in | Wt 174.0 lb

## 2014-09-29 DIAGNOSIS — M1712 Unilateral primary osteoarthritis, left knee: Secondary | ICD-10-CM

## 2014-09-29 DIAGNOSIS — I10 Essential (primary) hypertension: Secondary | ICD-10-CM

## 2014-09-29 DIAGNOSIS — M25562 Pain in left knee: Secondary | ICD-10-CM | POA: Diagnosis not present

## 2014-09-29 MED ORDER — DICLOFENAC SODIUM 2 % TD SOLN: 2.0000 | Freq: Two times a day (BID) | TRANSDERMAL | Status: DC

## 2014-09-29 NOTE — Patient Instructions (Signed)

## 2014-09-30 NOTE — Assessment & Plan Note (Signed)
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

## 2014-09-30 NOTE — Assessment & Plan Note (Addendum)
I offered to give her an IA injection with depo-medrol but she defers Will cont tumeric and oxycodone I have also asked her to start using pennsaid on her knee

## 2014-09-30 NOTE — Assessment & Plan Note (Signed)
Her BP is well controlled 

## 2014-09-30 NOTE — Progress Notes (Signed)
   Subjective:    Patient ID: Jade [name], female    DOB: 1956-05-01, 59 y.o.   MRN: [id number]  Knee Pain  The incident occurred more than 1 week ago. There was no injury mechanism. The pain is present in the left knee. The pain is at a severity of 3/10. The pain is moderate. The pain has been worsening since onset. Pertinent negatives include no inability to bear weight, loss of motion, loss of sensation, muscle weakness, numbness or tingling. Nothing aggravates the symptoms. Treatments tried: tumeric and oxycodone. The treatment provided mild relief.      Review of Systems  Constitutional: Negative.   HENT: Negative.   Eyes: Negative.   Respiratory: Negative.  Negative for cough, choking, chest tightness and shortness of breath.   Cardiovascular: Negative.  Negative for chest pain, palpitations and leg swelling.  Gastrointestinal: Negative.  Negative for nausea, abdominal pain, diarrhea and constipation.  Endocrine: Negative.   Genitourinary: Negative.   Musculoskeletal: Positive for arthralgias. Negative for myalgias, back pain, gait problem and neck pain.  Skin: Negative.   Allergic/Immunologic: Negative.   Neurological: Negative.  Negative for tingling and numbness.  Hematological: Negative.  Negative for adenopathy. Does not bruise/bleed easily.  Psychiatric/Behavioral: Negative.        Objective:   Physical Exam  Constitutional: She is oriented to person, place, and time. She appears well-developed and well-nourished. No distress.  HENT:  Head: Normocephalic and atraumatic.  Mouth/Throat: Oropharynx is clear and moist. No oropharyngeal exudate.  Eyes: Conjunctivae are normal. Right eye exhibits no discharge. Left eye exhibits no discharge. No scleral icterus.  Neck: Normal range of motion. Neck supple. No JVD present. No tracheal deviation present. No thyromegaly present.  Cardiovascular: Normal rate, regular rhythm, normal heart sounds and intact distal pulses.  Exam  reveals no gallop and no friction rub.   No murmur heard. Pulmonary/Chest: Effort normal and breath sounds normal. No stridor. No respiratory distress. She has no wheezes. She has no rales. She exhibits no tenderness.  Abdominal: Soft. Bowel sounds are normal. She exhibits no distension and no mass. There is no tenderness. There is no rebound and no guarding.  Musculoskeletal: Normal range of motion. She exhibits no edema or tenderness.       Left knee: She exhibits deformity (DJD changes and mild crepitance). She exhibits normal range of motion, no swelling, no effusion, no ecchymosis, no laceration, no erythema, normal alignment, no LCL laxity, normal patellar mobility and no bony tenderness. No lateral joint line, no MCL, no LCL and no patellar tendon tenderness noted.  Lymphadenopathy:    She has no cervical adenopathy.  Neurological: She is oriented to person, place, and time.  Skin: Skin is warm and dry. No rash noted. She is not diaphoretic. No erythema. No pallor.  Vitals reviewed.         Assessment & Plan:

## 2014-10-12 ENCOUNTER — Encounter: Payer: Self-pay | Admitting: Internal Medicine

## 2014-10-13 ENCOUNTER — Telehealth: Payer: Self-pay | Admitting: Internal Medicine

## 2014-10-13 DIAGNOSIS — M25562 Pain in left knee: Secondary | ICD-10-CM

## 2014-10-13 NOTE — Telephone Encounter (Signed)
Patient states she would prefer to try something non-invasive for her knee such as physical therapy. She does not want surgery or injections if possible. Also, I see there was MyChart message sent regarding the results of her knee xray but she is stating she did not receive anything.

## 2014-10-14 NOTE — Telephone Encounter (Signed)
PT referral sent

## 2014-10-20 ENCOUNTER — Ambulatory Visit (INDEPENDENT_AMBULATORY_CARE_PROVIDER_SITE_OTHER): Payer: Commercial Managed Care - HMO | Admitting: Endocrinology

## 2014-10-20 ENCOUNTER — Encounter: Payer: Self-pay | Admitting: Endocrinology

## 2014-10-20 VITALS — BP 128/78 | HR 78 | Temp 97.9°F | Ht 65.0 in | Wt 179.0 lb

## 2014-10-20 DIAGNOSIS — E1140 Type 2 diabetes mellitus with diabetic neuropathy, unspecified: Secondary | ICD-10-CM | POA: Diagnosis not present

## 2014-10-20 DIAGNOSIS — L659 Nonscarring hair loss, unspecified: Secondary | ICD-10-CM

## 2014-10-20 DIAGNOSIS — E119 Type 2 diabetes mellitus without complications: Secondary | ICD-10-CM | POA: Diagnosis not present

## 2014-10-20 DIAGNOSIS — E1149 Type 2 diabetes mellitus with other diabetic neurological complication: Secondary | ICD-10-CM

## 2014-10-20 DIAGNOSIS — H2513 Age-related nuclear cataract, bilateral: Secondary | ICD-10-CM | POA: Diagnosis not present

## 2014-10-20 LAB — FERRITIN: FERRITIN: 48.5 ng/mL (ref 10.0–291.0)

## 2014-10-20 LAB — HM DIABETES EYE EXAM

## 2014-10-20 NOTE — Progress Notes (Signed)
Subjective:    Patient ID: Jade [name], female    DOB: [birth year], 59 y.o.   MRN: [id number]  HPI pt states DM was dx'ed in 2007; she has mild neuropathy of the lower extremities, but no associated chronic complications; she took insulin from 2011-2012; pt says her diet and exercise are very good; she has never had GDM, pancreatitis, severe hypoglycemia or DKA. Due to her dietary efforts, she has lost 90 lbs.  She no longer requires any medication for DM.  Pt says she has hypothyroidism, despite normal TFT.   Past Medical History  Diagnosis Date  . Diabetes mellitus   . COPD (chronic obstructive pulmonary disease)   . Neuromuscular disorder   . Chronic kidney disease   . Hyperlipidemia   . Hypertension   . Sleep apnea     Past Surgical History  Procedure Laterality Date  . Hernia repair    . Cholecystectomy    . Tonsillectomy      History   Social History  . Marital Status: Married    Spouse Name: N/A  . Number of Children: N/A  . Years of Education: N/A   Occupational History  . Not on file.   Social History Main Topics  . Smoking status: Former Smoker    Quit date: 08/28/1987  . Smokeless tobacco: Never Used  . Alcohol Use: No  . Drug Use: No  . Sexual Activity: Yes    Birth Control/ Protection: Post-menopausal   Other Topics Concern  . Not on file   Social History Narrative    Current Outpatient Prescriptions on File Prior to Visit  Medication Sig Dispense Refill  . Alpha-Lipoic Acid 600 MG CAPS Take by mouth 2 (two) times daily.    . Betaine, Trimethylglycine, (TMG, TRIMETHYLGLYCINE,) 500 MG CAPS Take by mouth 2 (two) times daily.    Marland Kitchen Bioflavonoid Products (GRAPE SEED PO) Take 300 mg by mouth 3 (three) times daily.    . Blood Glucose Calibration (ACCU-CHEK SMARTVIEW CONTROL) LIQD Test up to TID dx:E11.40 3 each 3  . Blood Glucose Monitoring Suppl (ACCU-CHEK NANO SMARTVIEW) W/DEVICE KIT Test up to TID dx:E11.40 1 kit 3  . Calcium-Magnesium-Vitamin  D (CALCIUM 500 PO) Take by mouth. tid    . Cholecalciferol (VITAMIN D-3) 5000 UNITS TABS Take 4,000 Units by mouth.     . Chromium-Cinnamon (CINNAMON PLUS CHROMIUM) [phone] MCG-MG CAPS Take 10,000 mg by mouth 2 (two) times daily.    . Diclofenac Sodium (PENNSAID) 2 % SOLN Place 2 Act onto the skin 2 (two) times daily. 112 g 11  . glucose blood (ACCU-CHEK SMARTVIEW) test strip Test up to TID dx:E11.40 300 each 3  . Gymnema Sylvestris Leaf POWD by Does not apply route.    Marland Kitchen ketoconazole (NIZORAL) 2 % cream Apply 1 application topically 2 (two) times daily. 60 g 2  . Lancets Misc. (ACCU-CHEK FASTCLIX LANCET) KIT Test up to TID dx:E11.40 1 kit 2  . METHYLCOBALAMIN PO Take by mouth.    . Milk Thistle 1000 MG CAPS Take by mouth 3 (three) times daily.    . Multiple Vitamins-Minerals (MULTIVITAL-M) TABS Take 1 each by mouth daily.    . Omega-3 Fatty Acids (FISH OIL) 600 MG CAPS Take by mouth.    Marland Kitchen OVER THE COUNTER MEDICATION L Arginine 900 mg bid    . OVER THE COUNTER MEDICATION methylfolate 800 mg 5xdaily    . OVER THE COUNTER MEDICATION P5P 50 mg bid    . OVER  THE COUNTER MEDICATION Triple Magnesium    . OVER THE COUNTER MEDICATION Olive Leap extract 75 mg BID    . Pomegranate, Punica granatum, (POMEGRANATE PO) Take by mouth 2 (two) times daily.    . potassium gluconate 595 MG TABS Take 595 mg by mouth 3 (three) times daily.    . Probiotic Product (PROBIOTIC PO) Take by mouth.    . RESVERATROL 100 MG CAPS Take by mouth.    . Taurine 1000 MG CAPS Take by mouth 3 (three) times daily.    Marland Kitchen VITAMIN K PO Take by mouth. 50 mg QD    . olmesartan (BENICAR) 40 MG tablet Take 1 tablet (40 mg total) by mouth daily. (Patient not taking: Reported on 10/20/2014) 90 tablet 3   No current facility-administered medications on file prior to visit.    No Known Allergies  Family History  Problem Relation Age of Onset  . Heart disease Father   . Leukemia Father   . Alcohol abuse Son   . Cancer Neg Hx   .  Diabetes Neg Hx   . Hearing loss Neg Hx   . Hyperlipidemia Neg Hx   . Hypertension Neg Hx   . Kidney disease Neg Hx   . Stroke Neg Hx     BP 128/78 mmHg  Pulse 78  Temp(Src) 97.9 F (36.6 C) (Oral)  Ht $R'5\' 5"'LE$  (1.651 m)  Wt 179 lb (81.194 kg)  BMI 29.79 kg/m2  SpO2 96%   Review of Systems denies fever, headache, chest pain, sob, n/v, urinary frequency, excessive diaphoresis, cold intolerance, and easy bruising.  She has severe hair loss, rhinorrhea, fatigue, dry skin, leg cramps, and insomnia. She is seeing opthal today for blurry vision.     Objective:   Physical Exam VITAL SIGNS:  See vs page GENERAL: no distress Head: moderate diffuse alopecia Pulses: dorsalis pedis intact bilat.   MSK: no deformity of the feet. CV: trace bilat leg edema.   Skin:  no ulcer on the feet.  normal color and temp on the feet. Neuro: sensation is intact to touch on the feet.  Ext: bilat varicosities.     Lab Results  Component Value Date   HGBA1C 5.7 08/30/2014   Lab Results  Component Value Date   TSH 3.96 08/30/2014   T4TOTAL 9.6 08/30/2014      Assessment & Plan:  DM: new to me: much better control with weight loss. Hair loss, moderate exacerbation, uncertain etiology.   No thyroid cause is found.   Patient is advised the following: Patient Instructions  blood tests are being requested for you today.  We'll let you know about the results.   Please continue your weight loss efforts.  I would be happy to see you back here whenever you want.

## 2014-10-20 NOTE — Patient Instructions (Signed)
blood tests are being requested for you today.  We'll let you know about the results.   Please continue your weight loss efforts.  I would be happy to see you back here whenever you want.

## 2014-10-21 LAB — THYROID PEROXIDASE ANTIBODY

## 2014-10-22 ENCOUNTER — Encounter: Payer: Self-pay | Admitting: Internal Medicine

## 2014-10-24 LAB — GLUTAMIC ACID DECARBOXYLASE AUTO ABS

## 2014-10-25 ENCOUNTER — Encounter: Payer: Self-pay | Admitting: Endocrinology

## 2014-10-26 ENCOUNTER — Ambulatory Visit: Payer: Commercial Managed Care - HMO | Admitting: Nurse Practitioner

## 2014-11-03 ENCOUNTER — Encounter: Payer: Self-pay | Admitting: Physical Therapy

## 2014-11-03 ENCOUNTER — Encounter: Payer: Self-pay | Admitting: Nurse Practitioner

## 2014-11-03 ENCOUNTER — Other Ambulatory Visit (INDEPENDENT_AMBULATORY_CARE_PROVIDER_SITE_OTHER): Payer: Commercial Managed Care - HMO

## 2014-11-03 ENCOUNTER — Ambulatory Visit (INDEPENDENT_AMBULATORY_CARE_PROVIDER_SITE_OTHER): Payer: Commercial Managed Care - HMO | Admitting: Nurse Practitioner

## 2014-11-03 ENCOUNTER — Ambulatory Visit: Payer: Commercial Managed Care - HMO | Attending: Internal Medicine | Admitting: Physical Therapy

## 2014-11-03 VITALS — BP 142/70 | HR 74 | Ht 62.0 in | Wt 183.2 lb

## 2014-11-03 DIAGNOSIS — M25562 Pain in left knee: Secondary | ICD-10-CM | POA: Diagnosis not present

## 2014-11-03 DIAGNOSIS — R1010 Upper abdominal pain, unspecified: Secondary | ICD-10-CM | POA: Diagnosis not present

## 2014-11-03 DIAGNOSIS — G8929 Other chronic pain: Secondary | ICD-10-CM | POA: Insufficient documentation

## 2014-11-03 DIAGNOSIS — R109 Unspecified abdominal pain: Secondary | ICD-10-CM

## 2014-11-03 LAB — BASIC METABOLIC PANEL
BUN: 35 mg/dL — ABNORMAL HIGH (ref 6–23)
CO2: 28 mEq/L (ref 19–32)
Calcium: 9.9 mg/dL (ref 8.4–10.5)
Chloride: 107 mEq/L (ref 96–112)
Creatinine, Ser: 0.93 mg/dL (ref 0.40–1.20)
GFR: 65.39 mL/min (ref >60.00)
GLUCOSE: 111 mg/dL — AB (ref 70–99)
Potassium: 4 mEq/L (ref 3.5–5.1)
SODIUM: 140 meq/L (ref 135–145)

## 2014-11-03 LAB — LIPASE: Lipase: 110 U/L — ABNORMAL HIGH (ref 11.0–59.0)

## 2014-11-03 NOTE — Progress Notes (Signed)
HPI :   Patient is 59 year old female referred by PCP for evaluation of abdominal pain.  Patient has a longstanding history of pain under her right rib cage dating back to her twenties. Over the last couple of years patient has developed a different type of abdominal pain. She complains of painful lumps across the upper abdomen. It is uncomfortable to bend but even when lying still she has discomfort. Feels she is wearing a belt containing rocks. Pain is not related to eating. CBC and LFTs in January of this year were normal.  Patient made some drastic dietary changes a few years back. She is basically following the Paleo diet. Over the last several yeasr she has intentionally lost about 100 pounds. Discouraged that there hasn't been more weight loss. She endorses chronic constipation. She uses stool softeners as needed and takes fiber 3 times a day. Stools are like pellets lately. Patient reports a normal colonoscopy in [facility] in 2008. She cannot recall who performed the procedure.    Past Medical History  Diagnosis Date  . Diabetes mellitus   . COPD (chronic obstructive pulmonary disease)   . Neuromuscular disorder   . Chronic kidney disease   . Hyperlipidemia   . Hypertension   . Sleep apnea    Family History  Problem Relation Age of Onset  . Heart disease Father   . Leukemia Father   . Alcohol abuse Son   . Cancer Neg Hx   . Diabetes Neg Hx   . Hearing loss Neg Hx   . Hyperlipidemia Neg Hx   . Hypertension Neg Hx   . Kidney disease Neg Hx   . Stroke Neg Hx    History  Substance Use Topics  . Smoking status: Former Smoker    Quit date: 08/28/1987  . Smokeless tobacco: Never Used  . Alcohol Use: No   Current Outpatient Prescriptions  Medication Sig Dispense Refill  . Alpha-Lipoic Acid 600 MG CAPS Take by mouth 2 (two) times daily.    . Betaine, Trimethylglycine, (TMG, TRIMETHYLGLYCINE,) 500 MG CAPS Take by mouth 2 (two) times daily.    Marland Kitchen Bioflavonoid Products  (GRAPE SEED PO) Take 300 mg by mouth 3 (three) times daily.    . Blood Glucose Calibration (ACCU-CHEK SMARTVIEW CONTROL) LIQD Test up to TID dx:E11.40 3 each 3  . Blood Glucose Monitoring Suppl (ACCU-CHEK NANO SMARTVIEW) W/DEVICE KIT Test up to TID dx:E11.40 1 kit 3  . Cholecalciferol (VITAMIN D-3) 5000 UNITS TABS Take 4,000 Units by mouth.     . Chromium-Cinnamon (CINNAMON PLUS CHROMIUM) [phone] MCG-MG CAPS Take 10,000 mg by mouth 2 (two) times daily.    . Diclofenac Sodium (PENNSAID) 2 % SOLN Place 2 Act onto the skin 2 (two) times daily. 112 g 11  . glucose blood (ACCU-CHEK SMARTVIEW) test strip Test up to TID dx:E11.40 300 each 3  . Gymnema Sylvestris Leaf POWD by Does not apply route.    Marland Kitchen ketoconazole (NIZORAL) 2 % cream Apply 1 application topically 2 (two) times daily. 60 g 2  . Lancets Misc. (ACCU-CHEK FASTCLIX LANCET) KIT Test up to TID dx:E11.40 1 kit 2  . METHYLCOBALAMIN PO Take by mouth.    . Milk Thistle 1000 MG CAPS Take by mouth 3 (three) times daily.    . Multiple Vitamins-Minerals (MULTIVITAL-M) TABS Take 1 each by mouth daily.    . Omega-3 Fatty Acids (FISH OIL) 600 MG CAPS Take by mouth.    Marland Kitchen OVER THE COUNTER MEDICATION  L Arginine 900 mg bid    . OVER THE COUNTER MEDICATION methylfolate 800 mg 5xdaily    . OVER THE COUNTER MEDICATION P5P 50 mg bid    . OVER THE COUNTER MEDICATION Triple Magnesium    . OVER THE COUNTER MEDICATION Olive Leap extract 75 mg BID    . Pomegranate, Punica granatum, (POMEGRANATE PO) Take by mouth 2 (two) times daily.    . potassium gluconate 595 MG TABS Take 595 mg by mouth 3 (three) times daily.    . Probiotic Product (PROBIOTIC PO) Take by mouth.    . RESVERATROL 100 MG CAPS Take by mouth.    . Taurine 1000 MG CAPS Take by mouth 3 (three) times daily.    Marland Kitchen VITAMIN K PO Take by mouth. 50 mg QD     No current facility-administered medications for this visit.   No Known Allergies   Review of Systems: All systems reviewed and negative  except where noted in HPI.   Physical Exam: BP 142/70 mmHg  Pulse 74  Ht $R'5\' 2"'tV$  (1.575 m)  Wt 183 lb 3.2 oz (83.099 kg)  BMI 33.50 kg/m2 Constitutional: Pleasant,obese, white female in no acute distress. HEENT: Normocephalic and atraumatic. Conjunctivae are normal. No scleral icterus. Neck supple.  Cardiovascular: Normal rate, regular rhythm.  Pulmonary/chest: Effort normal and breath sounds normal. No wheezing, rales or rhonchi. Abdominal: Soft, obese,nondistended. Several tender, nickel size subcutaneous nodules in abdomen. There are no masses palpable. Extremities: no edema Lymphadenopathy: No cervical adenopathy noted. Neurological: Alert and oriented to person place and time. Skin: Skin is warm and dry. No rashes noted. Psychiatric: Normal mood and affect. Behavior is normal.   ASSESSMENT AND PLAN:  57. 59 year old female with chronic (years) right upper quadrant pain located just below the rib cage. Pain is not changed in anyway, present since her twenties. This must be musculoskeletal pain  2. Two history of upper abdominal discomfort unrelated to meals or defecation. Pain sporadic, no known triggers. She describes multiple tender lumps of the abdomen which she thinks is related to, or causing the pain. I do feel some small subcutaneous nodules around the abdomen but I suspect these are benign. At any rate, they are tender which is unusual.  I think it's worthwhile to get a CT scan to further abdominal pain. Will also obtain a lipase. If labs and CT scan unrevealing then will consider surgical referral for evaluation of these tender subcutaneous nodules (biopsy?).  3. Colon cancer screening. Patient reports normal colonoscopy in [facility] in 2008. We called [facility], it was not done there. At this point will recommend she has repeat screening in 2018 and less she can locate records   CC: Scarlette Calico, MD

## 2014-11-03 NOTE — Therapy (Signed)
[facility] [place] [address] [place], [place], [address] Phone: [phone]   Fax:  [phone]  Physical Therapy Evaluation  Patient Details  Name: Jade [name] MRN: [id number] Date of Birth: 1956-08-14 Referring Provider:  Etta Grandchild, MD  Encounter Date: 11/03/2014      PT End of Session - 11/03/14 6962    Visit Number 1   Number of Visits 8   Date for PT Re-Evaluation 01/03/15   PT Start Time 0840   PT Stop Time 0936   PT Time Calculation (min) 56 min      Past Medical History  Diagnosis Date  . Diabetes mellitus   . COPD (chronic obstructive pulmonary disease)   . Neuromuscular disorder   . Chronic kidney disease   . Hyperlipidemia   . Hypertension   . Sleep apnea     Past Surgical History  Procedure Laterality Date  . Hernia repair    . Cholecystectomy    . Tonsillectomy      There were no vitals taken for this visit.  Visit Diagnosis:  Left knee pain - Plan: PT plan of care cert/re-cert      Subjective Assessment - 11/03/14 0844    Symptoms Began having left posterior lateral knee pain in January.  She is unsure of a specific cause, reports may have overdid it on one day with shopping and exercise..  X-rays showed arthritis.   Pertinent History none   Limitations Standing;Walking;House hold activities   How long can you sit comfortably? 30 minutes   How long can you stand comfortably? 10 minutes   How long can you walk comfortably? 30 minutes to try to shop   Diagnostic tests x-rays show arthritis   Patient Stated Goals no pain and easier walking   Currently in Pain? Yes   Pain Score 4    Pain Location Knee   Pain Orientation Left   Pain Descriptors / Indicators Aching;Sharp   Pain Type Chronic pain   Pain Onset More than a month ago   Pain Frequency Intermittent   Aggravating Factors  standing and walking can get up to 9/10   Pain Relieving Factors ice and rest   Effect of Pain  on Daily Activities limited ability to be active          [facility] PT Assessment - 11/03/14 0001    Assessment   Medical Diagnosis left knee pain   Onset Date 09/04/14   Prior Therapy none   Precautions   Precautions None   Balance Screen   Has the patient fallen in the past 6 months No   Has the patient had a decrease in activity level because of a fear of falling?  No   Is the patient reluctant to leave their home because of a fear of falling?  No   Home Environment   Living Enviornment Private residence   Type of Home House   Additional Comments few steps into the home   Prior Function   Level of Independence Independent with basic ADLs;Independent with homemaking with ambulation   Leisure was trying to exercise 1-2 x/week   AROM   Overall AROM Comments left knee AROM was 0-95 degrees with pain for flexion   Strength   Overall Strength Comments strength of the left knee 3+/5 with pain for flexion   Flexibility   Soft Tissue Assessment /Muscle Length --  Very tight HS, calf, ITB and piriformis  Palpation   Palpation very tender to the left ITB, lateral tracking patella   Ambulation/Gait   Gait Comments mild antalgic on the left, worse with longer sitting periods especially the first few steps                          PT Education - 11/26/2014 0920    Education provided Yes   Education Details HEP with VMO strength, flexibility of all LE mms, also reviewd water walking and gym equipment   Person(s) Educated Patient   Methods Explanation;Demonstration;Tactile cues   Comprehension Verbalized understanding;Returned demonstration;Verbal cues required          PT Short Term Goals - Nov 26, 2014 0924    PT SHORT TERM GOAL #1   Baseline independent with initial HEP   Time 2   Period Weeks   Status New           PT Long Term Goals - 2014-11-26 1610    PT LONG TERM GOAL #1   Title independent with safe return to gym   Time 8   Period Weeks   Status New    PT LONG TERM GOAL #2   Title decrease pain 50%   Time 8   Period Weeks   Status New               Plan - 11/26/14 9604    Clinical Impression Statement Patient with left knee pain and arthritis, she has weak VMO, tight LE mms, lateral tracking patella, decreased ROM of the knee into flexion   Pt will benefit from skilled therapeutic intervention in order to improve on the following deficits Abnormal gait;Decreased range of motion;Difficulty walking;Impaired flexibility;Pain;Decreased endurance;Decreased mobility;Decreased strength   Rehab Potential Good   PT Frequency 1x / week   PT Duration 8 weeks   PT Treatment/Interventions Electrical Stimulation;Ultrasound;Moist Heat;Therapeutic activities;Patient/family education;Therapeutic exercise;Gait training;Manual techniques;Functional mobility training   PT Next Visit Plan assure HEP, work on gym safety and what to do and not do at gym, she wants to squat but has poor form   Consulted and Agree with Plan of Care Patient          G-Codes - Nov 26, 2014 0925    Functional Assessment Tool Used FOTO   Functional Limitation Mobility: Walking and moving around   Mobility: Walking and Moving Around Current Status [phone]) At least 60 percent but less than 80 percent impaired, limited or restricted   Mobility: Walking and Moving Around Goal Status [phone]) At least 40 percent but less than 60 percent impaired, limited or restricted       Problem List Patient Active Problem List   Diagnosis Date Noted  . Hair loss 10/20/2014  . Primary osteoarthritis of left knee 09/29/2014  . Left knee pain 09/27/2014  . Visit for screening mammogram 08/30/2014  . Routine general medical examination at a health care facility 08/30/2014  . Tinea corporis 12/21/2013  . Essential hypertension, benign 12/21/2013  . Fatigue 08/24/2013  . Low back pain 08/24/2013  . Patient noncompliant with statin medication 02/23/2013  . H/O abnormal Pap smear  10/24/2012  . Osteopenia 09/26/2012  . Diabetic neuropathy, painful 05/26/2012  . Diabetes mellitus type 2 with neurological manifestations 03/20/2012  . Pure hypercholesterolemia 03/20/2012  . Chronic venous insufficiency 03/20/2012    Jearld Lesch, PT 26-Nov-2014, 9:27 AM  [facility] [place] [address] [place], [place], [address] Phone: [phone]  Fax:  [phone]

## 2014-11-03 NOTE — Patient Instructions (Addendum)
You have been scheduled for a CT scan of the abdomen and pelvis at [facility] ([address]---this is in the same building as Press photographer).   You are scheduled on 11/03/14 at 3:00 pm. You should arrive 15 minutes prior to your appointment time for registration. Please follow the written instructions below on the day of your exam:  WARNING: IF YOU ARE ALLERGIC TO IODINE/X-RAY DYE, PLEASE NOTIFY RADIOLOGY IMMEDIATELY AT [phone]! YOU WILL BE GIVEN A 13 HOUR PREMEDICATION PREP.  1) Do not eat or drink anything after 11:00 am (4 hours prior to your test) 2) You have been given 2 bottles of oral contrast to drink. The solution may taste better if refrigerated, but do NOT add ice or any other liquid to this solution. Shake well before drinking.    Drink 1 bottle of contrast @ 1:00 pm (2 hours prior to your exam)  Drink 1 bottle of contrast @ 2:00 pm (1 hour prior to your exam)  You may take any medications as prescribed with a small amount of water except for the following: Metformin, Glucophage, Glucovance, Avandamet, Riomet, Fortamet, Actoplus Met, Janumet, Glumetza or Metaglip. The above medications must be held the day of the exam AND 48 hours after the exam.  The purpose of you drinking the oral contrast is to aid in the visualization of your intestinal tract. The contrast solution may cause some diarrhea. Before your exam is started, you will be given a small amount of fluid to drink. Depending on your individual set of symptoms, you may also receive an intravenous injection of x-ray contrast/dye. Plan on being at [facility] for 30 minutes or long, depending on the type of exam you are having performed.  This test typically takes 30-45 minutes to complete.  If you have any questions regarding your exam or if you need to reschedule, you may call the CT department at [phone] between the hours of 8:00 am and 5:00 pm,  Monday-Friday.  ________________________________________________________________________  Your physician has requested that you go to the basement for the following lab work before leaving today: BMET, Lipase  We have contacted [facility] to get your previous colonoscopy. Unfortunately, they have no record of colonoscopy in the system. By your report of previous normal colonoscopy in 2007, you will be due for a recall colonoscopy in 08/2016. We will send you a reminder in the mail when it gets closer to that time.

## 2014-11-04 ENCOUNTER — Other Ambulatory Visit: Payer: Commercial Managed Care - HMO

## 2014-11-05 NOTE — Progress Notes (Signed)
Agree with Ms. Guenther's assessment and plan. Carl E. Gessner, MD, FACG   

## 2014-11-08 ENCOUNTER — Ambulatory Visit (INDEPENDENT_AMBULATORY_CARE_PROVIDER_SITE_OTHER)
Admission: RE | Admit: 2014-11-08 | Discharge: 2014-11-08 | Disposition: A | Discharge status: Home / Self Care | Payer: Commercial Managed Care - HMO | Source: Ambulatory Visit | Attending: Nurse Practitioner | Admitting: Nurse Practitioner

## 2014-11-08 DIAGNOSIS — R1010 Upper abdominal pain, unspecified: Secondary | ICD-10-CM

## 2014-11-08 DIAGNOSIS — K828 Other specified diseases of gallbladder: Secondary | ICD-10-CM | POA: Diagnosis not present

## 2014-11-08 MED ORDER — IOHEXOL 300 MG/ML  SOLN
100.0000 mL | Freq: Once | INTRAMUSCULAR | Status: AC | PRN
  Administered 2014-11-08: 100 mL via INTRAVENOUS

## 2014-11-12 ENCOUNTER — Other Ambulatory Visit: Payer: Self-pay

## 2014-11-12 DIAGNOSIS — R1010 Upper abdominal pain, unspecified: Secondary | ICD-10-CM

## 2014-11-17 ENCOUNTER — Ambulatory Visit: Payer: Commercial Managed Care - HMO | Admitting: Physical Therapy

## 2014-12-29 ENCOUNTER — Ambulatory Visit: Payer: Commercial Managed Care - HMO | Admitting: Internal Medicine

## 2015-02-01 ENCOUNTER — Ambulatory Visit: Payer: Commercial Managed Care - HMO | Admitting: Internal Medicine

## 2015-03-08 ENCOUNTER — Ambulatory Visit (INDEPENDENT_AMBULATORY_CARE_PROVIDER_SITE_OTHER): Payer: Commercial Managed Care - HMO | Admitting: Internal Medicine

## 2015-03-08 ENCOUNTER — Other Ambulatory Visit (INDEPENDENT_AMBULATORY_CARE_PROVIDER_SITE_OTHER): Payer: Commercial Managed Care - HMO

## 2015-03-08 ENCOUNTER — Encounter: Payer: Self-pay | Admitting: Internal Medicine

## 2015-03-08 VITALS — BP 138/90 | HR 57 | Temp 98.2°F | Resp 16 | Ht 62.0 in | Wt 189.5 lb

## 2015-03-08 DIAGNOSIS — R1011 Right upper quadrant pain: Secondary | ICD-10-CM

## 2015-03-08 DIAGNOSIS — E1149 Type 2 diabetes mellitus with other diabetic neurological complication: Secondary | ICD-10-CM

## 2015-03-08 DIAGNOSIS — I10 Essential (primary) hypertension: Secondary | ICD-10-CM

## 2015-03-08 DIAGNOSIS — E1140 Type 2 diabetes mellitus with diabetic neuropathy, unspecified: Secondary | ICD-10-CM

## 2015-03-08 DIAGNOSIS — G8929 Other chronic pain: Secondary | ICD-10-CM | POA: Diagnosis not present

## 2015-03-08 DIAGNOSIS — R109 Unspecified abdominal pain: Secondary | ICD-10-CM

## 2015-03-08 LAB — URINALYSIS, ROUTINE W REFLEX MICROSCOPIC
Bilirubin Urine: NEGATIVE
Hgb urine dipstick: NEGATIVE
Ketones, ur: NEGATIVE
Leukocytes, UA: NEGATIVE
Nitrite: NEGATIVE
PH: 6 (ref 5.0–8.0)
RBC / HPF: NONE SEEN (ref 0–?)
SPECIFIC GRAVITY, URINE: 1.015 (ref 1.000–1.030)
Total Protein, Urine: NEGATIVE
Urine Glucose: NEGATIVE
Urobilinogen, UA: 0.2 (ref 0.0–1.0)
WBC, UA: NONE SEEN (ref 0–?)

## 2015-03-08 LAB — BASIC METABOLIC PANEL
BUN: 27 mg/dL — AB (ref 6–23)
CO2: 27 mEq/L (ref 19–32)
Calcium: 9.7 mg/dL (ref 8.4–10.5)
Chloride: 106 mEq/L (ref 96–112)
Creatinine, Ser: 1.1 mg/dL (ref 0.40–1.20)
GFR: 53.81 mL/min — ABNORMAL LOW (ref >60.00)
Glucose, Bld: 99 mg/dL (ref 70–99)
POTASSIUM: 4.3 meq/L (ref 3.5–5.1)
Sodium: 140 mEq/L (ref 135–145)

## 2015-03-08 LAB — CBC WITH DIFFERENTIAL/PLATELET
Basophils Absolute: 0.1 10*3/uL (ref 0.0–0.1)
Basophils Relative: 1.3 % (ref 0.0–3.0)
EOS ABS: 0.3 10*3/uL (ref 0.0–0.7)
Eosinophils Relative: 4.2 % (ref 0.0–5.0)
HEMATOCRIT: 41.6 % (ref 36.0–46.0)
Hemoglobin: 13.9 g/dL (ref 12.0–15.0)
Lymphocytes Relative: 41.9 % (ref 12.0–46.0)
Lymphs Abs: 3.2 10*3/uL (ref 0.7–4.0)
MCHC: 33.5 g/dL (ref 30.0–36.0)
MCV: 94.3 fl (ref 78.0–100.0)
MONO ABS: 0.5 10*3/uL (ref 0.1–1.0)
MONOS PCT: 6 % (ref 3.0–12.0)
NEUTROS ABS: 3.5 10*3/uL (ref 1.4–7.7)
NEUTROS PCT: 46.6 % (ref 43.0–77.0)
Platelets: 284 10*3/uL (ref 150.0–400.0)
RBC: 4.42 Mil/uL (ref 3.87–5.11)
RDW: 13.3 % (ref 11.5–15.5)
WBC: 7.5 10*3/uL (ref 4.0–10.5)

## 2015-03-08 LAB — HEMOGLOBIN A1C: HEMOGLOBIN A1C: 5.5 % (ref 4.6–6.5)

## 2015-03-08 NOTE — Progress Notes (Signed)
Subjective:  Patient ID: Jade [name], female    DOB: 02-02-1956  Age: 59 y.o. MRN: [id number]  CC: Diabetes and Flank Pain   HPI MIKKI ZIFF presents for a follow-up on diabetes but she also complains of recurrent episodes of right upper quadrant and flank pain. She thinks the pain has moved into the posterior rib cage now and she has episodes of sharp pain. Her blood sugars have been well controlled.   Outpatient Prescriptions Prior to Visit  Medication Sig Dispense Refill  . Alpha-Lipoic Acid 600 MG CAPS Take by mouth 2 (two) times daily.    . Betaine, Trimethylglycine, (TMG, TRIMETHYLGLYCINE,) 500 MG CAPS Take by mouth 2 (two) times daily.    Marland Kitchen Bioflavonoid Products (GRAPE SEED PO) Take 300 mg by mouth 3 (three) times daily.    . Blood Glucose Calibration (ACCU-CHEK SMARTVIEW CONTROL) LIQD Test up to TID dx:E11.40 3 each 3  . Blood Glucose Monitoring Suppl (ACCU-CHEK NANO SMARTVIEW) W/DEVICE KIT Test up to TID dx:E11.40 1 kit 3  . Cholecalciferol (VITAMIN D-3) 5000 UNITS TABS Take 4,000 Units by mouth.     . Chromium-Cinnamon (CINNAMON PLUS CHROMIUM) [id number] MCG-MG CAPS Take 10,000 mg by mouth 2 (two) times daily.    . Diclofenac Sodium (PENNSAID) 2 % SOLN Place 2 Act onto the skin 2 (two) times daily. 112 g 11  . glucose blood (ACCU-CHEK SMARTVIEW) test strip Test up to TID dx:E11.40 300 each 3  . Gymnema Sylvestris Leaf POWD by Does not apply route.    Marland Kitchen ketoconazole (NIZORAL) 2 % cream Apply 1 application topically 2 (two) times daily. 60 g 2  . Lancets Misc. (ACCU-CHEK FASTCLIX LANCET) KIT Test up to TID dx:E11.40 1 kit 2  . METHYLCOBALAMIN PO Take by mouth.    . Milk Thistle 1000 MG CAPS Take by mouth 3 (three) times daily.    . Multiple Vitamins-Minerals (MULTIVITAL-M) TABS Take 1 each by mouth daily.    . Omega-3 Fatty Acids (FISH OIL) 600 MG CAPS Take by mouth.    Marland Kitchen OVER THE COUNTER MEDICATION L Arginine 900 mg bid    . OVER THE COUNTER MEDICATION methylfolate  800 mg 5xdaily    . OVER THE COUNTER MEDICATION P5P 50 mg bid    . OVER THE COUNTER MEDICATION Triple Magnesium    . OVER THE COUNTER MEDICATION Olive Leap extract 75 mg BID    . Pomegranate, Punica granatum, (POMEGRANATE PO) Take by mouth 2 (two) times daily.    . potassium gluconate 595 MG TABS Take 595 mg by mouth 3 (three) times daily.    . Probiotic Product (PROBIOTIC PO) Take by mouth.    . RESVERATROL 100 MG CAPS Take by mouth.    . Taurine 1000 MG CAPS Take by mouth 3 (three) times daily.    Marland Kitchen VITAMIN K PO Take by mouth. 50 mg QD     No facility-administered medications prior to visit.    ROS Review of Systems  Constitutional: Negative.  Negative for fever, chills, diaphoresis, appetite change and fatigue.  HENT: Negative.   Eyes: Negative.   Respiratory: Negative.  Negative for cough, choking, chest tightness, shortness of breath and stridor.   Cardiovascular: Negative.  Negative for chest pain, palpitations and leg swelling.  Gastrointestinal: Positive for abdominal pain. Negative for nausea, vomiting, diarrhea, constipation, blood in stool, abdominal distention and anal bleeding.  Endocrine: Negative.   Genitourinary: Positive for flank pain. Negative for dysuria, urgency, frequency, hematuria, decreased urine  volume, difficulty urinating and dyspareunia.  Musculoskeletal: Negative.  Negative for myalgias, back pain, joint swelling and arthralgias.  Skin: Negative.  Negative for rash.  Allergic/Immunologic: Negative.   Neurological: Negative.  Negative for dizziness, syncope, weakness and light-headedness.  Hematological: Negative.  Negative for adenopathy. Does not bruise/bleed easily.  Psychiatric/Behavioral: Positive for sleep disturbance. Negative for suicidal ideas, hallucinations, behavioral problems, confusion, self-injury, dysphoric mood, decreased concentration and agitation. The patient is not nervous/anxious and is not hyperactive.     Objective:  BP 138/90 mmHg   Pulse 57  Temp(Src) 98.2 F (36.8 C) (Oral)  Resp 16  Ht 5\' 2"  (1.575 m)  Wt 189 lb 8 oz (85.957 kg)  BMI 34.65 kg/m2  SpO2 97%  BP Readings from Last 3 Encounters:  03/08/15 138/90  11/03/14 142/70  10/20/14 128/78    Wt Readings from Last 3 Encounters:  03/08/15 189 lb 8 oz (85.957 kg)  11/03/14 183 lb 3.2 oz (83.099 kg)  10/20/14 179 lb (81.194 kg)    Physical Exam  Constitutional: She is oriented to person, place, and time.  Non-toxic appearance. She does not have a sickly appearance. She does not appear ill. No distress.  HENT:  Mouth/Throat: Oropharynx is clear and moist. No oropharyngeal exudate.  Eyes: Conjunctivae are normal. Right eye exhibits no discharge. Left eye exhibits no discharge. No scleral icterus.  Neck: Normal range of motion. Neck supple. No JVD present. No tracheal deviation present. No thyromegaly present.  Cardiovascular: Normal rate, regular rhythm, normal heart sounds and intact distal pulses.  Exam reveals no gallop and no friction rub.   No murmur heard. Pulmonary/Chest: Effort normal and breath sounds normal. No stridor. No respiratory distress. She has no wheezes. She has no rales. She exhibits no tenderness.  Abdominal: Soft. Bowel sounds are normal. She exhibits no distension and no mass. There is no hepatosplenomegaly, splenomegaly or hepatomegaly. There is no tenderness. There is no rebound, no guarding and no CVA tenderness. No hernia. Hernia confirmed negative in the ventral area, confirmed negative in the right inguinal area and confirmed negative in the left inguinal area.  Musculoskeletal: Normal range of motion. She exhibits no edema or tenderness.  Lymphadenopathy:    She has no cervical adenopathy.  Neurological: She is oriented to person, place, and time.  Skin: Skin is warm and dry. No rash noted. She is not diaphoretic. No erythema. No pallor.  Vitals reviewed.   Lab Results  Component Value Date   WBC 7.5 03/08/2015   HGB  13.9 03/08/2015   HCT 41.6 03/08/2015   PLT 284.0 03/08/2015   GLUCOSE 99 03/08/2015   CHOL 229* 08/30/2014   TRIG 84.0 08/30/2014   HDL 76.00 08/30/2014   LDLDIRECT 164.7 08/24/2013   LDLCALC 136* 08/30/2014   ALT 15 08/30/2014   AST 22 08/30/2014   NA 140 03/08/2015   K 4.3 03/08/2015   CL 106 03/08/2015   CREATININE 1.10 03/08/2015   BUN 27* 03/08/2015   CO2 27 03/08/2015   TSH 3.96 08/30/2014   HGBA1C 5.5 03/08/2015   MICROALBUR 0.6 08/30/2014    Ct Abdomen Pelvis W Contrast  11/08/2014   CLINICAL DATA:  Chronic right upper quadrant pain. History of ventral hernia repair.  EXAM: CT ABDOMEN AND PELVIS WITH CONTRAST  TECHNIQUE: Multidetector CT imaging of the abdomen and pelvis was performed using the standard protocol following bolus administration of intravenous contrast. Oral contrast was also administered.  CONTRAST:  11/10/2014 OMNIPAQUE IOHEXOL 300 MG/ML  SOLN  COMPARISON:  Jan 21, 2009  FINDINGS: There is mild atelectasis in the anterior left lung base. Lung bases are otherwise clear.  Liver is prominent, measuring 17.9 cm in length. No focal liver lesions are identified. Gallbladder is absent. There is calcification in the gallbladder fossa region. There is no appreciable biliary duct dilatation.  Spleen, pancreas, and adrenals appear normal. Kidneys bilaterally show no mass or hydronephrosis on either side. There is no renal or ureteral calculus on either side.  In the pelvis, the urinary bladder is midline with normal wall thickness. There is no appreciable pelvic mass or pelvic fluid collection. The appendix appears normal.  The patient has had previous repair of a ventral hernia.  There is fairly diffuse stool throughout colon. There is no bowel obstruction. No free air or portal venous air.  There is no appreciable ascites, adenopathy, or abscess in the abdomen or pelvis. There is atherosclerotic change in the aorta but no aneurysm.  There is fairly severe collapse of the L1  vertebral body. No other fracture. There is vacuum phenomenon at T11-12 and T12-L1. There are no blastic or lytic bone lesions.  IMPRESSION: Calcification in the gallbladder fossa region measuring 2.3 x 1.3 cm. Question calculus in a cystic duct remnant. There are smaller calcifications in the gallbladder fossa region which are stable compared to the prior study from 2010. This larger calcification was not present on the 2010 study. There is no fluid in the gallbladder fossa region. There is no appreciable biliary duct dilatation. By report, gallbladder is absent. Liver is prominent, measuring 17.9 cm in length.  Fairly marked collapse of the L1 vertebral body, not present on prior study.  Fairly diffuse stool throughout colon.  Status post repair of ventral hernia. No hernia currently present in this region.  No bowel obstruction. No abscess. Appendix appears normal. No renal or ureteral calculus. No hydronephrosis.   Electronically Signed   By: Lowella Grip III M.D.   On: 11/08/2014 10:34    Assessment & Plan:   Nyomie was seen today for diabetes and flank pain.  Diagnoses and all orders for this visit:  Essential hypertension, benign - her blood pressure is well controlled, lites and renal function are stable. Orders: -     CBC with Differential/Platelet; Future -     Basic metabolic panel; Future -     Urinalysis, Routine w reflex microscopic (not at [facility]); Future  Diabetes mellitus type 2 with neurological manifestations - her blood sugars are well-controlled. Orders: -     Basic metabolic panel; Future -     Hemoglobin A1c; Future  Right flank pain, chronic - this is a chronic recurrent problem for her. She just had a CAT scan done about 4 months ago that was unremarkable. Her exam and labs are normal today. I considered repeating her CAT scan but she is not interested in that. Will follow for now. Orders: -     CBC with Differential/Platelet; Future -     Basic metabolic panel;  Future -     Urinalysis, Routine w reflex microscopic (not at [facility]); Future -     CT Abdomen Pelvis W Contrast; Future   I am having Ms. Farace maintain her MULTIVITAL-M, Chromium-Cinnamon, Fish Oil, Bioflavonoid Products (GRAPE SEED PO), Milk Thistle, (Pomegranate, Punica granatum, (POMEGRANATE PO)), OVER THE COUNTER MEDICATION, Taurine, Alpha-Lipoic Acid, OVER THE COUNTER MEDICATION, OVER THE COUNTER MEDICATION, VITAMIN K PO, Vitamin D-3, OVER THE COUNTER MEDICATION, potassium gluconate, TMG (Trimethylglycine), Probiotic Product (PROBIOTIC  PO), METHYLCOBALAMIN PO, Gymnema Sylvestris Leaf, Resveratrol, OVER THE COUNTER MEDICATION, ketoconazole, ACCU-CHEK FASTCLIX LANCET, glucose blood, ACCU-CHEK SMARTVIEW CONTROL, ACCU-CHEK NANO SMARTVIEW, and Diclofenac Sodium.  No orders of the defined types were placed in this encounter.     Follow-up: Return in about 4 weeks (around 04/05/2015).  Scarlette Calico, MD

## 2015-03-08 NOTE — Progress Notes (Signed)
Pre visit review using our clinic review tool, if applicable. No additional management support is needed unless otherwise documented below in the visit note. 

## 2015-03-08 NOTE — Patient Instructions (Signed)
Flank Pain °Flank pain refers to pain that is located on the side of the body between the upper abdomen and the back. The pain may occur over a short period of time (acute) or may be long-term or reoccurring (chronic). It may be mild or severe. Flank pain can be caused by many things. °CAUSES  °Some of the more common causes of flank pain include: °· Muscle strains.   °· Muscle spasms.   °· A disease of your spine (vertebral disk disease).   °· A lung infection (pneumonia).   °· Fluid around your lungs (pulmonary edema).   °· A kidney infection.   °· Kidney stones.   °· A very painful skin rash caused by the chickenpox virus (shingles).   °· Gallbladder disease.   °HOME CARE INSTRUCTIONS  °Home care will depend on the cause of your pain. In general, °· Rest as directed by your caregiver. °· Drink enough fluids to keep your urine clear or pale yellow. °· Only take over-the-counter or prescription medicines as directed by your caregiver. Some medicines may help relieve the pain. °· Tell your caregiver about any changes in your pain. °· Follow up with your caregiver as directed. °SEEK IMMEDIATE MEDICAL CARE IF:  °· Your pain is not controlled with medicine.   °· You have new or worsening symptoms. °· Your pain increases.   °· You have abdominal pain.   °· You have shortness of breath.   °· You have persistent nausea or vomiting.   °· You have swelling in your abdomen.   °· You feel faint or pass out.   °· You have blood in your urine. °· You have a fever or persistent symptoms for more than 2-3 days. °· You have a fever and your symptoms suddenly get worse. °MAKE SURE YOU:  °· Understand these instructions. °· Will watch your condition. °· Will get help right away if you are not doing well or get worse. °Document Released: 10/04/2005 Document Revised: 05/07/2012 Document Reviewed: 03/27/2012 °[employer]® Patient Information ©2015 [employer]. This information is not intended to replace advice given to you by your  health care provider. Make sure you discuss any questions you have with your health care provider. ° °

## 2015-04-05 ENCOUNTER — Observation Stay (HOSPITAL_COMMUNITY)
Admission: EM | Admit: 2015-04-05 | Discharge: 2015-04-06 | Disposition: A | Discharge status: Home / Self Care | Payer: Commercial Managed Care - HMO | Attending: Internal Medicine | Admitting: Internal Medicine

## 2015-04-05 ENCOUNTER — Emergency Department (HOSPITAL_COMMUNITY): Payer: Commercial Managed Care - HMO

## 2015-04-05 ENCOUNTER — Encounter (HOSPITAL_COMMUNITY): Payer: Self-pay | Admitting: *Deleted

## 2015-04-05 DIAGNOSIS — I129 Hypertensive chronic kidney disease with stage 1 through stage 4 chronic kidney disease, or unspecified chronic kidney disease: Secondary | ICD-10-CM | POA: Insufficient documentation

## 2015-04-05 DIAGNOSIS — J449 Chronic obstructive pulmonary disease, unspecified: Secondary | ICD-10-CM | POA: Insufficient documentation

## 2015-04-05 DIAGNOSIS — N189 Chronic kidney disease, unspecified: Secondary | ICD-10-CM | POA: Insufficient documentation

## 2015-04-05 DIAGNOSIS — Z87891 Personal history of nicotine dependence: Secondary | ICD-10-CM | POA: Insufficient documentation

## 2015-04-05 DIAGNOSIS — R079 Chest pain, unspecified: Secondary | ICD-10-CM | POA: Diagnosis present

## 2015-04-05 DIAGNOSIS — R0789 Other chest pain: Principal | ICD-10-CM | POA: Insufficient documentation

## 2015-04-05 DIAGNOSIS — E785 Hyperlipidemia, unspecified: Secondary | ICD-10-CM | POA: Diagnosis not present

## 2015-04-05 DIAGNOSIS — G4730 Sleep apnea, unspecified: Secondary | ICD-10-CM | POA: Diagnosis not present

## 2015-04-05 DIAGNOSIS — Z79899 Other long term (current) drug therapy: Secondary | ICD-10-CM | POA: Diagnosis not present

## 2015-04-05 DIAGNOSIS — E669 Obesity, unspecified: Secondary | ICD-10-CM | POA: Insufficient documentation

## 2015-04-05 DIAGNOSIS — Z6834 Body mass index (BMI) 34.0-34.9, adult: Secondary | ICD-10-CM | POA: Insufficient documentation

## 2015-04-05 DIAGNOSIS — G709 Myoneural disorder, unspecified: Secondary | ICD-10-CM | POA: Insufficient documentation

## 2015-04-05 DIAGNOSIS — E1122 Type 2 diabetes mellitus with diabetic chronic kidney disease: Secondary | ICD-10-CM | POA: Insufficient documentation

## 2015-04-05 DIAGNOSIS — G4733 Obstructive sleep apnea (adult) (pediatric): Secondary | ICD-10-CM | POA: Diagnosis not present

## 2015-04-05 LAB — BASIC METABOLIC PANEL
Anion gap: 7 (ref 5–15)
BUN: 28 mg/dL — ABNORMAL HIGH (ref 6–20)
CALCIUM: 9.2 mg/dL (ref 8.9–10.3)
CO2: 27 mmol/L (ref 22–32)
Chloride: 105 mmol/L (ref 101–111)
Creatinine, Ser: 0.97 mg/dL (ref 0.44–1.00)
Glucose, Bld: 105 mg/dL — ABNORMAL HIGH (ref 65–99)
Potassium: 4.7 mmol/L (ref 3.5–5.1)
Sodium: 139 mmol/L (ref 135–145)

## 2015-04-05 LAB — CBC
HCT: 41.6 % (ref 36.0–46.0)
Hemoglobin: 13.9 g/dL (ref 12.0–15.0)
MCH: 31.7 pg (ref 26.0–34.0)
MCHC: 33.4 g/dL (ref 30.0–36.0)
MCV: 95 fL (ref 78.0–100.0)
Platelets: 289 10*3/uL (ref 150–400)
RBC: 4.38 MIL/uL (ref 3.87–5.11)
RDW: 13 % (ref 11.5–15.5)
WBC: 6.9 10*3/uL (ref 4.0–10.5)

## 2015-04-05 LAB — I-STAT TROPONIN, ED: Troponin i, poc: 0 ng/mL (ref 0.00–0.08)

## 2015-04-05 LAB — CBG MONITORING, ED: GLUCOSE-CAPILLARY: 87 mg/dL (ref 65–99)

## 2015-04-05 LAB — TROPONIN I
Troponin I: <0.03 ng/mL (ref <0.031)
Troponin I: <0.03 ng/mL (ref <0.031)

## 2015-04-05 MED ORDER — ASPIRIN 81 MG PO CHEW
324.0000 mg | CHEWABLE_TABLET | Freq: Once | ORAL | Status: AC
  Administered 2015-04-05: 324 mg via ORAL
  Filled 2015-04-05: qty 4

## 2015-04-05 MED ORDER — ASPIRIN EC 325 MG PO TBEC
325.0000 mg | DELAYED_RELEASE_TABLET | Freq: Every day | ORAL | Status: DC
Start: 2015-04-05 — End: 2015-04-06
  Administered 2015-04-05 – 2015-04-06 (×2): 325 mg via ORAL
  Filled 2015-04-05 (×2): qty 1

## 2015-04-05 MED ORDER — HEPARIN SODIUM (PORCINE) 5000 UNIT/ML IJ SOLN
5000.0000 [IU] | Freq: Three times a day (TID) | INTRAMUSCULAR | Status: DC
  Administered 2015-04-05 – 2015-04-06 (×3): 5000 [IU] via SUBCUTANEOUS
  Filled 2015-04-05 (×4): qty 1

## 2015-04-05 MED ORDER — ALPHA-LIPOIC ACID 600 MG PO CAPS: 600.0000 mg | ORAL_CAPSULE | Freq: Two times a day (BID) | ORAL | Status: DC

## 2015-04-05 MED ORDER — VITAMIN D3 25 MCG (1000 UNIT) PO TABS
5000.0000 [IU] | ORAL_TABLET | Freq: Every day | ORAL | Status: DC
  Administered 2015-04-05 – 2015-04-06 (×2): 5000 [IU] via ORAL
  Filled 2015-04-05 (×2): qty 5

## 2015-04-05 MED ORDER — MORPHINE SULFATE 2 MG/ML IJ SOLN: 2.0000 mg | INTRAMUSCULAR | Status: DC | PRN

## 2015-04-05 MED ORDER — ACETAMINOPHEN 325 MG PO TABS: 650.0000 mg | ORAL_TABLET | ORAL | Status: DC | PRN

## 2015-04-05 MED ORDER — ONDANSETRON HCL 4 MG/2ML IJ SOLN: 4.0000 mg | Freq: Four times a day (QID) | INTRAMUSCULAR | Status: DC | PRN

## 2015-04-05 NOTE — H&P (Signed)
[facility] History and Physical  Jade [name] ZYY:[id number] DOB: [birth year] DOA: 04/05/2015  Referring physician: Dr.  PCP: Jade Calico, MD   Chief Complaint: chest pain  HPI: Jade [name] is a 59 y.o. female past medical history of essential hypertension as per patient diabetes type 2 diet controlled COPD history of tobacco use that comes in for chest pain that started at 4:30 in the morning intermittently substernal with no radiation. She relates nothing makes it better or worse. If randomly happens every several minutes. It is not associated with nausea vomiting diaphoresis or shortness of breath. She relates she's never had this pain before. She denies any fever, cough, abdominal pain, diarrhea or urinary symptoms.  In the ED: Labs were normal EKG as below, chest x-ray showed no cardio pulmonary disease.  Review of Systems:  Constitutional:  No weight loss, night sweats, Fevers, chills, fatigue.  HEENT:  No headaches, Difficulty swallowing,Tooth/dental problems,Sore throat,  No sneezing, itching, ear ache, nasal congestion, post nasal drip,  Cardio-vascular:  No chest pain, Orthopnea, PND, swelling in lower extremities, anasarca, dizziness, palpitations  GI:  No heartburn, indigestion, abdominal pain, nausea, vomiting, diarrhea, change in bowel habits, loss of appetite  Resp:  No shortness of breath with exertion or at rest. No excess mucus, no productive cough, No non-productive cough, No coughing up of blood.No change in color of mucus.No wheezing.No chest wall deformity  Skin:  no rash or lesions.  GU:  no dysuria, change in color of urine, no urgency or frequency. No flank pain.  Musculoskeletal:  No joint pain or swelling. No decreased range of motion. No back pain.  Psych:  No change in mood or affect. No depression or anxiety. No memory loss.   Past Medical History  Diagnosis Date  . Diabetes mellitus   . COPD (chronic obstructive pulmonary disease)    . Neuromuscular disorder   . Chronic kidney disease   . Hyperlipidemia   . Hypertension   . Sleep apnea    Past Surgical History  Procedure Laterality Date  . Hernia repair    . Cholecystectomy    . Tonsillectomy     Social History:  reports that she quit smoking about 27 years ago. She has never used smokeless tobacco. She reports that she does not drink alcohol or use illicit drugs.  No Known Allergies  Family History  Problem Relation Age of Onset  . Heart disease Father   . Leukemia Father   . Alcohol abuse Son   . Cancer Neg Hx   . Diabetes Neg Hx   . Hearing loss Neg Hx   . Hyperlipidemia Neg Hx   . Hypertension Neg Hx   . Kidney disease Neg Hx   . Stroke Neg Hx   . Other Mother      Prior to Admission medications   Medication Sig Start Date End Date Taking? Authorizing Provider  Alpha-Lipoic Acid 600 MG CAPS Take 600 mg by mouth 2 (two) times daily.    Yes Historical Provider, MD  Bioflavonoid Products (GRAPE SEED PO) Take 300 mg by mouth 3 (three) times daily.   Yes Historical Provider, MD  Blood Glucose Calibration (ACCU-CHEK SMARTVIEW CONTROL) LIQD Test up to TID dx:E11.40 09/13/14  Yes Janith Lima, MD  Blood Glucose Monitoring Suppl (ACCU-CHEK NANO SMARTVIEW) W/DEVICE KIT Test up to TID dx:E11.40 09/13/14  Yes Janith Lima, MD  Cholecalciferol (VITAMIN D-3) 5000 UNITS TABS Take 5,000 Units by mouth daily.    Yes  Historical Provider, MD  glucose blood (ACCU-CHEK SMARTVIEW) test strip Test up to TID dx:E11.40 09/13/14  Yes Janith Lima, MD  Lancets Misc. (ACCU-CHEK FASTCLIX LANCET) KIT Test up to TID dx:E11.40 09/13/14  Yes Janith Lima, MD  METHYLCOBALAMIN PO Take 1 tablet by mouth daily.    Yes Historical Provider, MD  Milk Thistle 1000 MG CAPS Take 1,000 mg by mouth 3 (three) times daily.    Yes Historical Provider, MD  Multiple Vitamins-Minerals (MULTIVITAL-M) TABS Take 1 tablet by mouth daily.    Yes Historical Provider, MD  Omega-3 Fatty Acids (FISH  OIL) 600 MG CAPS Take 600 mg by mouth 3 (three) times daily.    Yes Historical Provider, MD  OVER THE COUNTER MEDICATION Take 900 mg by mouth 2 (two) times daily. L Arginine 900 mg bid   Yes Historical Provider, MD  OVER THE COUNTER MEDICATION Take 800 mg by mouth 5 (five) times daily. Methylfolate   Yes Historical Provider, MD  OVER THE COUNTER MEDICATION Take 50 mg by mouth 2 (two) times daily. P5P   Yes Historical Provider, MD  OVER THE COUNTER MEDICATION Take 133 mg by mouth 3 (three) times daily. Triple Magnesium   Yes Historical Provider, MD  OVER THE COUNTER MEDICATION Take 750 mg by mouth 2 (two) times daily. Olive Leaf Extract   Yes Historical Provider, MD  OVER THE COUNTER MEDICATION Take 1 tablet by mouth 3 (three) times daily. Beetroot   Yes Historical Provider, MD  OVER THE COUNTER MEDICATION Take 1 tablet by mouth 3 (three) times daily. Berberine Complex w/ Cinnamon, Gymnema, & Fenugreek   Yes Historical Provider, MD  potassium gluconate 595 MG TABS Take 595 mg by mouth 3 (three) times daily.   Yes Historical Provider, MD  Probiotic Product (PROBIOTIC PO) Take 1 tablet by mouth daily.    Yes Historical Provider, MD  RESVERATROL 100 MG CAPS Take 50 mg by mouth daily.    Yes Historical Provider, MD  Diclofenac Sodium (PENNSAID) 2 % SOLN Place 2 Act onto the skin 2 (two) times daily. Patient not taking: Reported on 04/05/2015 09/29/14   Janith Lima, MD  ketoconazole (NIZORAL) 2 % cream Apply 1 application topically 2 (two) times daily. Patient not taking: Reported on 04/05/2015 12/21/13   Janith Lima, MD   Physical Exam: Filed Vitals:   04/05/15 0659 04/05/15 0914 04/05/15 1029  BP: 193/59 151/50 121/77  Pulse: 53 50 64  Resp: $Remo'10 10 16  'CFAvn$ Height: $Rem'5\' 2"'XoYz$  (1.575 m)    Weight: 85.276 kg (188 lb)    SpO2: 100% 98% 100%    Wt Readings from Last 3 Encounters:  04/05/15 85.276 kg (188 lb)  03/08/15 85.957 kg (189 lb 8 oz)  11/03/14 83.099 kg (183 lb 3.2 oz)    General:  Appears calm  and comfortable Eyes: PERRL, normal lids, irises & conjunctiva ENT: grossly normal hearing, lips & tongue Neck: no LAD, masses or thyromegaly Cardiovascular: RRR, no m/r/g. Pain is reproducible by palpation. Telemetry: SR, no arrhythmias  Respiratory: CTA bilaterally, no w/r/r. Normal respiratory effort. Abdomen: soft, ntnd Skin: no rash or induration seen on limited exam Musculoskeletal: grossly normal tone BUE/BLE Psychiatric: grossly normal mood and affect, speech fluent and appropriate Neurologic: grossly non-focal.          Labs on Admission:  Basic Metabolic Panel:  Recent Labs Lab 04/05/15 0738  NA 139  K 4.7  CL 105  CO2 27  GLUCOSE 105*  BUN 28*  CREATININE 0.97  CALCIUM 9.2   Liver Function Tests: No results for input(s): AST, ALT, ALKPHOS, BILITOT, PROT, ALBUMIN in the last 168 hours. No results for input(s): LIPASE, AMYLASE in the last 168 hours. No results for input(s): AMMONIA in the last 168 hours. CBC:  Recent Labs Lab 04/05/15 0738  WBC 6.9  HGB 13.9  HCT 41.6  MCV 95.0  PLT 289   Cardiac Enzymes:  Recent Labs Lab 04/05/15 1040  TROPONINI <0.03    BNP (last 3 results) No results for input(s): BNP in the last 8760 hours.  ProBNP (last 3 results) No results for input(s): PROBNP in the last 8760 hours.  CBG:  Recent Labs Lab 04/05/15 0701  GLUCAP 87    Radiological Exams on Admission: Dg Chest 2 View  04/05/2015   CLINICAL DATA:  Chest pain  EXAM: CHEST  2 VIEW  COMPARISON:  09/26/2012  FINDINGS: The heart size and mediastinal contours are within normal limits. Both lungs are clear. The visualized skeletal structures are unremarkable.  IMPRESSION: No active cardiopulmonary disease.   Electronically Signed   By: Franchot Gallo M.D.   On: 04/05/2015 07:27    EKG: Independently reviewed.   Assessment/Plan Active Problems:   Chest pain  Admit to telemetry, cycle cardiac enzymes. EKG EKG in the morning check a 2-D echo. She  relates she started a new exercise program, her pain is reproducible by palpation. Unlikely ACS, cycle her cardiac enzymes, her heart score/they remain negative she'll follow-up with cardiology as an outpatient to perform a stress test.    Code Status: full DVT Prophylaxis:heparin Family Communication: none Disposition Plan: observation  Time spent: 60 min  Charlynne Cousins [facility] Pager [phone]

## 2015-04-05 NOTE — ED Provider Notes (Signed)
CSN: [id number]     Arrival date & time 04/05/15  0650 History   First MD Initiated Contact with Patient 04/05/15 [phone]     Chief Complaint  Patient presents with  . Chest Pain     (Consider location/radiation/quality/duration/timing/severity/associated sxs/prior Treatment) HPI Comments: 59yo F w/ PMH including HTN, HLD, T2DM, CKD, COPD, OSA who p/w chest pain. Definitely 4:30 this morning, several minutes after her alarm went off, she had a 1 second episode of substernal, squeezing chest pain. The chest pain has continued to occur since that time, lasts approximately one second, and happens randomly every several minutes. It is not ripping or tearing in nature. It does not radiate. It is not associated with any nausea, vomiting, diaphoresis, or shortness of breath. She's never had this chest pain before. She denies any recent illness, fevers, cough/cold symptoms, abdominal pain, vomiting, diarrhea, or urinary symptoms. She does follow with a PCP and recent hemoglobin A1c was 5.3. She is not currently on any medications other than supplements as she feels like her medical problems have been controlled without them.  Patient is a 59 y.o. female presenting with chest pain. The history is provided by the patient.  Chest Pain   Past Medical History  Diagnosis Date  . Diabetes mellitus   . COPD (chronic obstructive pulmonary disease)   . Neuromuscular disorder   . Chronic kidney disease   . Hyperlipidemia   . Hypertension   . Sleep apnea    Past Surgical History  Procedure Laterality Date  . Hernia repair    . Cholecystectomy    . Tonsillectomy     Family History  Problem Relation Age of Onset  . Heart disease Father   . Leukemia Father   . Alcohol abuse Son   . Cancer Neg Hx   . Diabetes Neg Hx   . Hearing loss Neg Hx   . Hyperlipidemia Neg Hx   . Hypertension Neg Hx   . Kidney disease Neg Hx   . Stroke Neg Hx    History  Substance Use Topics  . Smoking status: Former Smoker     Quit date: 08/28/1987  . Smokeless tobacco: Never Used  . Alcohol Use: No   OB History    Gravida Para Term Preterm AB TAB SAB Ectopic Multiple Living   '3 1 1  2  2   1     '$ Review of Systems  Cardiovascular: Positive for chest pain.    10 Systems reviewed and are negative for acute change except as noted in the HPI.   Allergies  Review of patient's allergies indicates no known allergies.  Home Medications   Prior to Admission medications   Medication Sig Start Date End Date Taking? Authorizing Provider  Alpha-Lipoic Acid 600 MG CAPS Take 600 mg by mouth 2 (two) times daily.    Yes Historical Provider, MD  Bioflavonoid Products (GRAPE SEED PO) Take 300 mg by mouth 3 (three) times daily.   Yes Historical Provider, MD  Blood Glucose Calibration (ACCU-CHEK SMARTVIEW CONTROL) LIQD Test up to TID dx:E11.40 09/13/14  Yes Janith Lima, MD  Blood Glucose Monitoring Suppl (ACCU-CHEK NANO SMARTVIEW) W/DEVICE KIT Test up to TID dx:E11.40 09/13/14  Yes Janith Lima, MD  Cholecalciferol (VITAMIN D-3) 5000 UNITS TABS Take 5,000 Units by mouth daily.    Yes Historical Provider, MD  glucose blood (ACCU-CHEK SMARTVIEW) test strip Test up to TID dx:E11.40 09/13/14  Yes Janith Lima, MD  Lancets Misc. [facility]  LANCET) KIT Test up to TID dx:E11.40 09/13/14  Yes Janith Lima, MD  METHYLCOBALAMIN PO Take 1 tablet by mouth daily.    Yes Historical Provider, MD  Milk Thistle 1000 MG CAPS Take 1,000 mg by mouth 3 (three) times daily.    Yes Historical Provider, MD  Multiple Vitamins-Minerals (MULTIVITAL-M) TABS Take 1 tablet by mouth daily.    Yes Historical Provider, MD  Omega-3 Fatty Acids (FISH OIL) 600 MG CAPS Take 600 mg by mouth 3 (three) times daily.    Yes Historical Provider, MD  OVER THE COUNTER MEDICATION Take 900 mg by mouth 2 (two) times daily. L Arginine 900 mg bid   Yes Historical Provider, MD  OVER THE COUNTER MEDICATION Take 800 mg by mouth 5 (five) times daily.  Methylfolate   Yes Historical Provider, MD  OVER THE COUNTER MEDICATION Take 50 mg by mouth 2 (two) times daily. P5P   Yes Historical Provider, MD  OVER THE COUNTER MEDICATION Take 133 mg by mouth 3 (three) times daily. Triple Magnesium   Yes Historical Provider, MD  OVER THE COUNTER MEDICATION Take 750 mg by mouth 2 (two) times daily. Olive Leaf Extract   Yes Historical Provider, MD  OVER THE COUNTER MEDICATION Take 1 tablet by mouth 3 (three) times daily. Beetroot   Yes Historical Provider, MD  OVER THE COUNTER MEDICATION Take 1 tablet by mouth 3 (three) times daily. Berberine Complex w/ Cinnamon, Gymnema, & Fenugreek   Yes Historical Provider, MD  potassium gluconate 595 MG TABS Take 595 mg by mouth 3 (three) times daily.   Yes Historical Provider, MD  Probiotic Product (PROBIOTIC PO) Take 1 tablet by mouth daily.    Yes Historical Provider, MD  RESVERATROL 100 MG CAPS Take 50 mg by mouth daily.    Yes Historical Provider, MD  Diclofenac Sodium (PENNSAID) 2 % SOLN Place 2 Act onto the skin 2 (two) times daily. Patient not taking: Reported on 04/05/2015 09/29/14   Janith Lima, MD  ketoconazole (NIZORAL) 2 % cream Apply 1 application topically 2 (two) times daily. Patient not taking: Reported on 04/05/2015 12/21/13   Janith Lima, MD   BP 121/77 mmHg  Pulse 64  Resp 16  Ht $R'5\' 2"'Vh$  (1.575 m)  Wt 188 lb (85.276 kg)  BMI 34.38 kg/m2  SpO2 100% Physical Exam  Constitutional: She is oriented to person, place, and time. She appears well-developed and well-nourished. No distress.  HENT:  Head: Normocephalic and atraumatic.  Moist mucous membranes  Eyes: Conjunctivae are normal. Pupils are equal, round, and reactive to light.  Neck: Neck supple.  Cardiovascular: Normal rate, regular rhythm and normal heart sounds.   No murmur heard. Pulmonary/Chest: Effort normal and breath sounds normal.  Abdominal: Soft. Bowel sounds are normal. She exhibits no distension. There is no tenderness.   Musculoskeletal:  Trace b/l LE edema  Neurological: She is alert and oriented to person, place, and time.  Fluent speech  Skin: Skin is warm and dry.  Psychiatric: She has a normal mood and affect. Judgment normal.  Nursing note and vitals reviewed.   ED Course  Procedures (including critical care time) Labs Review Labs Reviewed  BASIC METABOLIC PANEL - Abnormal; Notable for the following:    Glucose, Bld 105 (*)    BUN 28 (*)    All other components within normal limits  CBC  TROPONIN I  CBG MONITORING, ED  CBG MONITORING, ED  Randolm Idol, ED    Imaging Review Dg  Chest 2 View  04/05/2015   CLINICAL DATA:  Chest pain  EXAM: CHEST  2 VIEW  COMPARISON:  09/26/2012  FINDINGS: The heart size and mediastinal contours are within normal limits. Both lungs are clear. The visualized skeletal structures are unremarkable.  IMPRESSION: No active cardiopulmonary disease.   Electronically Signed   By: Franchot Gallo M.D.   On: 04/05/2015 07:27     EKG Interpretation   Date/Time:  Tuesday April 05 2015 06:57:22 EDT Ventricular Rate:  55 PR Interval:  150 QRS Duration: 100 QT Interval:  432 QTC Calculation: 413 R Axis:   -56 Text Interpretation:  Sinus rhythm Left anterior fascicular block Abnormal  R-wave progression, late transition Borderline T wave abnormalities  Confirmed by Kindall Swaby MD, Marizol Borror ([id number]) on 04/05/2015 7:10:19 AM      MDM   Final diagnoses:  Other chest pain   59yo F p/w intermittent episodes of squeezing chest pain that began this morning. Patient well-appearing at presentation. Vitals notable for heart rate 53, blood pressure 193/59. No abnormalities noted on physical exam. Sent above lab work and obtained EKG and chest x-ray. EKG on arrival shows left anterior fascicular block but otherwise no ischemic changes. CXR unremarkable. Pt received $RemoveBef'324mg'BgoHbMRdAv$  ASA on arrival.   Patient denies any family history of heart disease or blood clots. Patient has no personal  history of blood clots or cancer and has had no recent travel. Her risk of PE is extremely low and she has no associated SOB, tachycardia thus I feel that PE is very unlikely.   Serial troponins are negative. Based on the patient's abnormal EKG, multiple risk factors for which she is not currently taking medication, and ongoing chest pain episodes, her heart score is 5. Based on this, she will be admitted to general medicine for further workup of her chest pain.  Sharlett Iles, MD 04/05/15 [phone]

## 2015-04-05 NOTE — Progress Notes (Signed)
.  rxPHARMACIST - PHYSICIAN ORDER COMMUNICATION  CONCERNING: P&T Medication Policy on Herbal Medications  DESCRIPTION:  This patient's order for:  Alpha Lipoic Acid has been noted.  This product(s) is classified as an "herbal" or natural product. Due to a lack of definitive safety studies or FDA approval, nonstandard manufacturing practices, plus the potential risk of unknown drug-drug interactions while on inpatient medications, the Pharmacy and Therapeutics Committee does not permit the use of "herbal" or natural products of this type within [facility].   ACTION TAKEN: The pharmacy department is unable to verify this order at this time and your patient has been informed of this safety policy. Please reevaluate patient's clinical condition at discharge and address if the herbal or natural product(s) should be resumed at that time.

## 2015-04-05 NOTE — ED Notes (Signed)
Pt has been having intermittent chest pains since about 0430 this am. Substernal and squeezing in nature. Pt denies n/v, diaphoresis, shortness of breath. Pt states that she hasn't had pain like this before

## 2015-04-06 ENCOUNTER — Observation Stay (HOSPITAL_BASED_OUTPATIENT_CLINIC_OR_DEPARTMENT_OTHER): Payer: Commercial Managed Care - HMO

## 2015-04-06 DIAGNOSIS — I129 Hypertensive chronic kidney disease with stage 1 through stage 4 chronic kidney disease, or unspecified chronic kidney disease: Secondary | ICD-10-CM | POA: Diagnosis not present

## 2015-04-06 DIAGNOSIS — I10 Essential (primary) hypertension: Secondary | ICD-10-CM

## 2015-04-06 DIAGNOSIS — N189 Chronic kidney disease, unspecified: Secondary | ICD-10-CM | POA: Diagnosis not present

## 2015-04-06 DIAGNOSIS — E1122 Type 2 diabetes mellitus with diabetic chronic kidney disease: Secondary | ICD-10-CM | POA: Diagnosis not present

## 2015-04-06 DIAGNOSIS — R0789 Other chest pain: Secondary | ICD-10-CM

## 2015-04-06 DIAGNOSIS — R079 Chest pain, unspecified: Secondary | ICD-10-CM

## 2015-04-06 DIAGNOSIS — J449 Chronic obstructive pulmonary disease, unspecified: Secondary | ICD-10-CM | POA: Diagnosis not present

## 2015-04-06 DIAGNOSIS — G709 Myoneural disorder, unspecified: Secondary | ICD-10-CM | POA: Diagnosis not present

## 2015-04-06 DIAGNOSIS — E119 Type 2 diabetes mellitus without complications: Secondary | ICD-10-CM

## 2015-04-06 DIAGNOSIS — E785 Hyperlipidemia, unspecified: Secondary | ICD-10-CM | POA: Diagnosis not present

## 2015-04-06 DIAGNOSIS — G4733 Obstructive sleep apnea (adult) (pediatric): Secondary | ICD-10-CM | POA: Diagnosis not present

## 2015-04-06 DIAGNOSIS — G4730 Sleep apnea, unspecified: Secondary | ICD-10-CM | POA: Diagnosis not present

## 2015-04-06 MED ORDER — PERFLUTREN LIPID MICROSPHERE
1.0000 mL | INTRAVENOUS | Status: AC | PRN
  Administered 2015-04-06: 4 mL via INTRAVENOUS
  Filled 2015-04-06: qty 10

## 2015-04-06 NOTE — Discharge Summary (Signed)
Physician Discharge Summary  Jade [name] Jade [name] DOB: 04-02-1956 DOA: 04/05/2015  PCP: Jade Calico, MD  Admit date: 04/05/2015 Discharge date: 04/06/2015  Time spent: 45 minutes  Recommendations for Outpatient Follow-up:  Patient will be discharged to home.  Patient will need to follow up with primary care provider within one week of discharge.  Patient will also need to contact Dr. Thurman [name] office, cardiology, for outpatient stress test and follow up.  Patient should continue medications as prescribed.  Patient should follow a heart healthy/carb modified diet.    Discharge Diagnoses:  Atypical Chest pain Diabetes Mellitus, Type 2 Hypertension  Discharge Condition: Stable  Diet recommendation: Heart healthy/carb modified  Filed Weights   04/05/15 0659 04/05/15 1345  Weight: 85.276 kg (188 lb) 86.365 kg (190 lb 6.4 oz)    History of present illness:  On 04/05/2015 by Jade [name] Jade [name] is a 59 y.o. female past medical history of essential hypertension as per patient diabetes type 2 diet controlled COPD history of tobacco use that comes in for chest pain that started at 4:30 in the morning intermittently substernal with no radiation. She relates nothing makes it better or worse. If randomly happens every several minutes. It is not associated with nausea vomiting diaphoresis or shortness of breath. She relates she's never had this pain before. She denies any fever, cough, abdominal pain, diarrhea or urinary symptoms.  Hospital Course:  Atypical chest pain -Troponin cycled and negative 3 sets -Echocardiogram: EF 55-60%, left ventricular diastolic function parameters are normal -Spoke with Jade [name] regarding possible outpatient stress test, he agreed -Patient is a high risk for coronary artery disease due to obesity, diabetes, hypertension -Of note, patient recently started new exercise program. Counseled patient regarding advancing slowly. -Patient  did have some reproducible sternal pain however patient denies this is same pain that she was feeling earlier. Currently she is chest pain-free.  Hypertension -diet controlled  Diabetes mellitus, type 2 -Diet controlled, no longer on medications  Intentional weight loss  -With diet and exercise -Spoke with patient regarding her OTC/supplement use.  Advised patient to follow up closely with PCP regarding her diet and exercise program.   Procedures: echocardiogram  Consultations: Jade [name], via phone  Discharge Exam: Filed Vitals:   04/06/15 1325  BP: 142/45  Pulse: 60  Temp: 98 F (36.7 C)  Resp: 16     General: Well developed, well nourished, NAD, appears stated age  HEENT: NCAT, mucous membranes moist.  Cardiovascular: S1 S2 auscultated, no rubs, murmurs or gallops. Regular rate and rhythm.  Chest: sternal pain with palpation   Respiratory: Clear to auscultation bilaterally with equal chest rise  Abdomen: Soft, nontender, nondistended, + bowel sounds  Extremities: warm dry without cyanosis clubbing or edema  Neuro: AAOx3, nonfocal  Psych: Normal affect and demeanor with intact judgement and insight  Discharge Instructions      Discharge Instructions    Discharge instructions    Complete by:  As directed   Patient will be discharged to home.  Patient will need to follow up with primary care provider within one week of discharge.  Patient will also need to contact Dr. Thurman [name] office, cardiology, for outpatient stress test and follow up.  Patient should continue medications as prescribed.  Patient should follow a heart healthy/carb modified diet.            Medication List    STOP taking these medications        Diclofenac Sodium 2 %  Soln  Commonly known as:  PENNSAID     ketoconazole 2 % cream  Commonly known as:  NIZORAL      TAKE these medications        ACCU-CHEK FASTCLIX LANCET Kit  Test up to TID dx:E11.40     ACCU-CHEK NANO SMARTVIEW  W/DEVICE Kit  Test up to TID dx:E11.40     ACCU-CHEK SMARTVIEW CONTROL Liqd  Test up to TID dx:E11.40     Alpha-Lipoic Acid 600 MG Caps  Take 600 mg by mouth 2 (two) times daily.     Fish Oil 600 MG Caps  Take 600 mg by mouth 3 (three) times daily.     glucose blood test strip  Commonly known as:  ACCU-CHEK SMARTVIEW  Test up to TID dx:E11.40     GRAPE SEED PO  Take 300 mg by mouth 3 (three) times daily.     METHYLCOBALAMIN PO  Take 1 tablet by mouth daily.     Milk Thistle 1000 MG Caps  Take 1,000 mg by mouth 3 (three) times daily.     MULTIVITAL-M Tabs  Take 1 tablet by mouth daily.     OVER THE COUNTER MEDICATION  Take 1 tablet by mouth 3 (three) times daily. Beetroot     OVER THE COUNTER MEDICATION  Take 1 tablet by mouth 3 (three) times daily. Berberine Complex w/ Cinnamon, Gymnema, & Fenugreek     OVER THE COUNTER MEDICATION  Take 900 mg by mouth 2 (two) times daily. L Arginine 900 mg bid     OVER THE COUNTER MEDICATION  Take 800 mg by mouth 5 (five) times daily. Methylfolate     OVER THE COUNTER MEDICATION  Take 50 mg by mouth 2 (two) times daily. P5P     OVER THE COUNTER MEDICATION  Take 133 mg by mouth 3 (three) times daily. Triple Magnesium     OVER THE COUNTER MEDICATION  Take 750 mg by mouth 2 (two) times daily. Olive Leaf Extract     potassium gluconate 595 MG Tabs tablet  Take 595 mg by mouth 3 (three) times daily.     PROBIOTIC PO  Take 1 tablet by mouth daily.     Resveratrol 100 MG Caps  Take 50 mg by mouth daily.     Vitamin D-3 5000 UNITS Tabs  Take 5,000 Units by mouth daily.       No Known Allergies Follow-up Information    Follow up with Jade Calico, MD. Schedule an appointment as soon as possible for a visit in 1 week.   Specialty:  Internal Medicine   Why:  Hospital followup   Contact information:   [address] [address] [phone]       Follow up with Jade Standing, MD. Schedule an  appointment as soon as possible for a visit in 1 week.   Specialty:  Cardiology   Why:  Hospital follow up, Stress test   Contact information:   J[name] [address] [phone]        The results of significant diagnostics from this hospitalization (including imaging, microbiology, ancillary and laboratory) are listed below for reference.    Significant Diagnostic Studies: Dg Chest 2 View  04/05/2015   CLINICAL DATA:  Chest pain  EXAM: CHEST  2 VIEW  COMPARISON:  09/26/2012  FINDINGS: The heart size and mediastinal contours are within normal limits. Both lungs are clear. The visualized skeletal structures  are unremarkable.  IMPRESSION: No active cardiopulmonary disease.   Electronically Signed   By: Franchot Gallo M.D.   On: 04/05/2015 07:27    Microbiology: No results found for this or any previous visit (from the past 240 hour(s)).   Labs: Basic Metabolic Panel:  Recent Labs Lab 04/05/15 0738  NA 139  K 4.7  CL 105  CO2 27  GLUCOSE 105*  BUN 28*  CREATININE 0.97  CALCIUM 9.2   Liver Function Tests: No results for input(s): AST, ALT, ALKPHOS, BILITOT, PROT, ALBUMIN in the last 168 hours. No results for input(s): LIPASE, AMYLASE in the last 168 hours. No results for input(s): AMMONIA in the last 168 hours. CBC:  Recent Labs Lab 04/05/15 0738  WBC 6.9  HGB 13.9  HCT 41.6  MCV 95.0  PLT 289   Cardiac Enzymes:  Recent Labs Lab 04/05/15 1040 04/05/15 1444  TROPONINI <0.03 <0.03   BNP: BNP (last 3 results) No results for input(s): BNP in the last 8760 hours.  ProBNP (last 3 results) No results for input(s): PROBNP in the last 8760 hours.  CBG:  Recent Labs Lab 04/05/15 0701  GLUCAP 87    Signed:  Cristal Ford  [facility] 04/06/2015, 2:16 PM

## 2015-04-06 NOTE — Progress Notes (Addendum)
  Echocardiogram 2D Echocardiogram with Definity has been performed.  Jade [name] 04/06/2015, 10:37 AM

## 2015-04-06 NOTE — Progress Notes (Signed)
Reviewed discharge information with patient and caregiver. Answered all questions. Patient/caregiver able to teach back medications and reasons to contact MD/911. Patient verbalizes importance of PCP follow up appointment.   

## 2015-04-06 NOTE — Progress Notes (Signed)
Patient prefers to schedule appointment with dr Yetta Barre and dr Donnie Aho.

## 2015-04-06 NOTE — Discharge Instructions (Signed)

## 2015-04-07 ENCOUNTER — Ambulatory Visit: Payer: Commercial Managed Care - HMO | Admitting: Internal Medicine

## 2015-04-12 ENCOUNTER — Encounter: Payer: Self-pay | Admitting: Internal Medicine

## 2015-04-12 ENCOUNTER — Ambulatory Visit (INDEPENDENT_AMBULATORY_CARE_PROVIDER_SITE_OTHER): Payer: Commercial Managed Care - HMO | Admitting: Internal Medicine

## 2015-04-12 ENCOUNTER — Telehealth: Payer: Self-pay

## 2015-04-12 VITALS — BP 138/72 | HR 61 | Temp 97.5°F | Resp 16 | Ht 62.0 in | Wt 195.0 lb

## 2015-04-12 DIAGNOSIS — K589 Irritable bowel syndrome without diarrhea: Secondary | ICD-10-CM | POA: Diagnosis not present

## 2015-04-12 DIAGNOSIS — I10 Essential (primary) hypertension: Secondary | ICD-10-CM

## 2015-04-12 DIAGNOSIS — Z1231 Encounter for screening mammogram for malignant neoplasm of breast: Secondary | ICD-10-CM

## 2015-04-12 NOTE — Patient Instructions (Signed)
Irritable Bowel Syndrome Irritable bowel syndrome (IBS) is caused by a disturbance of normal bowel function and is a common digestive disorder. You may also hear this condition called spastic colon, mucous colitis, and irritable colon. There is no cure for IBS. However, symptoms often gradually improve or disappear with a good diet, stress management, and medicine. This condition usually appears in late adolescence or early adulthood. Women develop it twice as often as men. CAUSES  After food has been digested and absorbed in the small intestine, waste material is moved into the large intestine, or colon. In the colon, water and salts are absorbed from the undigested products coming from the small intestine. The remaining residue, or fecal material, is held for elimination. Under normal circumstances, gentle, rhythmic contractions of the bowel walls push the fecal material along the colon toward the rectum. In IBS, however, these contractions are irregular and poorly coordinated. The fecal material is either retained too long, resulting in constipation, or expelled too soon, producing diarrhea. SIGNS AND SYMPTOMS  The most common symptom of IBS is abdominal pain. It is often in the lower left side of the abdomen, but it may occur anywhere in the abdomen. The pain comes from spasms of the bowel muscles happening too much and from the buildup of gas and fecal material in the colon. This pain:  Can range from sharp abdominal cramps to a dull, continuous ache.  Often worsens soon after eating.  Is often relieved by having a bowel movement or passing gas. Abdominal pain is usually accompanied by constipation, but it may also produce diarrhea. The diarrhea often occurs right after a meal or upon waking up in the morning. The stools are often soft, watery, and flecked with mucus. Other symptoms of IBS include:  Bloating.  Loss of appetite.  Heartburn.  Backache.  Dull pain in the arms or  shoulders.  Nausea.  Burping.  Vomiting.  Gas. IBS may also cause symptoms that are unrelated to the digestive system, such as:  Fatigue.  Headaches.  Anxiety.  Shortness of breath.  Trouble concentrating.  Dizziness. These symptoms tend to come and go. DIAGNOSIS  The symptoms of IBS may seem like symptoms of other, more serious digestive disorders. Your health care provider may want to perform tests to exclude these disorders.  TREATMENT Many medicines are available to help correct bowel function or relieve bowel spasms and abdominal pain. Among the medicines available are:  Laxatives for severe constipation and to help restore normal bowel habits.  Specific antidiarrheal medicines to treat severe or lasting diarrhea.  Antispasmodic agents to relieve intestinal cramps. Your health care provider may also decide to treat you with a mild tranquilizer or sedative during unusually stressful periods in your life. Your health care provider may also prescribe antidepressant medicine. The use of this medicine has been shown to reduce pain and other symptoms of IBS. Remember that if any medicine is prescribed for you, you should take it exactly as directed. Make sure your health care provider knows how well it worked for you. HOME CARE INSTRUCTIONS   Take all medicines as directed by your health care provider.  Avoid foods that are high in fat or oils, such as heavy cream, butter, frankfurters, sausage, and other fatty meats.  Avoid foods that make you go to the bathroom, such as fruit, fruit juice, and dairy products.  Cut out carbonated drinks, chewing gum, and "gassy" foods such as beans and cabbage. This may help relieve bloating and burping.    Eat foods with bran, and drink plenty of liquids with the bran foods. This helps relieve constipation.  Keep track of what foods seem to bring on your symptoms.  Avoid emotionally charged situations or circumstances that produce  anxiety.  Start or continue exercising.  Get plenty of rest and sleep. Document Released: 08/13/2005 Document Revised: 08/18/2013 Document Reviewed: 04/02/2008 Exit[employer] Patient Information 2015 [employer]. This information is not intended to replace advice given to you by your health care provider. Make sure you discuss any questions you have with your health care provider.  

## 2015-04-12 NOTE — Telephone Encounter (Signed)
Talked to pt about mammogram per [facility] reminder during OV. Order has been placed. Pt is aware that someone will be calling to schedule

## 2015-04-12 NOTE — Progress Notes (Signed)
Pre visit review using our clinic review tool, if applicable. No additional management support is needed unless otherwise documented below in the visit note. 

## 2015-04-13 NOTE — Assessment & Plan Note (Signed)
She appears to have irritable bowel syndrome. Workup of this has been benign thus far. She does not want to treat this with any medications.

## 2015-04-13 NOTE — Assessment & Plan Note (Signed)
Her blood pressure is well-controlled, she does not have any concerning signs or symptoms.

## 2015-04-13 NOTE — Progress Notes (Signed)
Subjective:  Patient ID: Jade [name], female    DOB: 1956-03-07  Age: 59 y.o. MRN: [id number]  CC: Hypertension and Abdominal Pain   HPI [name] presents for follow-up after recent admission to the hospital for chest pain. About a week or 2 ago she had an episode of tightness and squeezing under her sternum. She was admitted to the hospital and ruled out for MI and heart failure. She has had no more episodes of chest pain or shortness of breath. She has an appointment later this week with cardiology.  She continues to complain of intermittent episodes of abdominal pain. This has been going on for many years. The pain can occur in the right flank, the right upper quadrant, or the right mid abdomen. She described it as an uncomfortable aching sensation that is occasionally stabbing. She does not report any nausea, vomiting, diarrhea, or constipation.  Outpatient Prescriptions Prior to Visit  Medication Sig Dispense Refill  . Alpha-Lipoic Acid 600 MG CAPS Take 600 mg by mouth 2 (two) times daily.     Marland Kitchen Bioflavonoid Products (GRAPE SEED PO) Take 300 mg by mouth 3 (three) times daily.    . Blood Glucose Calibration (ACCU-CHEK SMARTVIEW CONTROL) LIQD Test up to TID dx:E11.40 3 each 3  . Blood Glucose Monitoring Suppl (ACCU-CHEK NANO SMARTVIEW) W/DEVICE KIT Test up to TID dx:E11.40 1 kit 3  . Cholecalciferol (VITAMIN D-3) 5000 UNITS TABS Take 5,000 Units by mouth daily.     Marland Kitchen glucose blood (ACCU-CHEK SMARTVIEW) test strip Test up to TID dx:E11.40 300 each 3  . Lancets Misc. (ACCU-CHEK FASTCLIX LANCET) KIT Test up to TID dx:E11.40 1 kit 2  . METHYLCOBALAMIN PO Take 1 tablet by mouth daily.     . Milk Thistle 1000 MG CAPS Take 1,000 mg by mouth 3 (three) times daily.     . Multiple Vitamins-Minerals (MULTIVITAL-M) TABS Take 1 tablet by mouth daily.     . Omega-3 Fatty Acids (FISH OIL) 600 MG CAPS Take 600 mg by mouth 3 (three) times daily.     Marland Kitchen OVER THE COUNTER MEDICATION Take 800  mg by mouth 5 (five) times daily. Methylfolate    . OVER THE COUNTER MEDICATION Take 50 mg by mouth 2 (two) times daily. P5P    . OVER THE COUNTER MEDICATION Take 133 mg by mouth 3 (three) times daily. Triple Magnesium    . OVER THE COUNTER MEDICATION Take 750 mg by mouth 2 (two) times daily. Olive Leaf Extract    . OVER THE COUNTER MEDICATION Take 1 tablet by mouth 3 (three) times daily. Beetroot    . OVER THE COUNTER MEDICATION Take 1 tablet by mouth 3 (three) times daily. Berberine Complex w/ Cinnamon, Gymnema, & Fenugreek    . potassium gluconate 595 MG TABS Take 595 mg by mouth 3 (three) times daily.    . Probiotic Product (PROBIOTIC PO) Take 1 tablet by mouth daily.     Marland Kitchen RESVERATROL 100 MG CAPS Take 50 mg by mouth daily.     Marland Kitchen OVER THE COUNTER MEDICATION Take 900 mg by mouth 2 (two) times daily. L Arginine 900 mg bid     No facility-administered medications prior to visit.    ROS Review of Systems  Constitutional: Negative.  Negative for fever, chills, diaphoresis, appetite change and fatigue.  HENT: Negative.   Eyes: Negative.   Respiratory: Negative.  Negative for cough, choking, chest tightness, shortness of breath and stridor.   Cardiovascular: Negative.  Negative for chest pain, palpitations and leg swelling.  Gastrointestinal: Positive for abdominal pain. Negative for nausea, vomiting, diarrhea, constipation, blood in stool, abdominal distention, anal bleeding and rectal pain.  Endocrine: Negative.   Genitourinary: Negative.   Musculoskeletal: Positive for arthralgias. Negative for back pain and neck pain.  Skin: Negative.  Negative for rash.  Allergic/Immunologic: Negative.   Neurological: Negative.  Negative for dizziness, syncope, speech difficulty, light-headedness and headaches.  Hematological: Negative.   Psychiatric/Behavioral: Negative.     Objective:  BP 138/72 mmHg  Pulse 61  Temp(Src) 97.5 F (36.4 C) (Oral)  Resp 16  Ht $R'5\' 2"'CE$  (1.575 m)  Wt 195 lb  (88.451 kg)  BMI 35.66 kg/m2  SpO2 97%  BP Readings from Last 3 Encounters:  04/12/15 138/72  04/06/15 142/45  03/08/15 138/90    Wt Readings from Last 3 Encounters:  04/12/15 195 lb (88.451 kg)  04/05/15 190 lb 6.4 oz (86.365 kg)  03/08/15 189 lb 8 oz (85.957 kg)    Physical Exam  Constitutional: She is oriented to person, place, and time.  Non-toxic appearance. She does not have a sickly appearance. She does not appear ill. No distress.  HENT:  Mouth/Throat: Oropharynx is clear and moist. No oropharyngeal exudate.  Eyes: Conjunctivae are normal. Right eye exhibits no discharge. Left eye exhibits no discharge. No scleral icterus.  Neck: Normal range of motion. Neck supple. No JVD present. No tracheal deviation present. No thyromegaly present.  Cardiovascular: Normal rate, regular rhythm, normal heart sounds and intact distal pulses.  Exam reveals no gallop and no friction rub.   No murmur heard. Pulmonary/Chest: Effort normal and breath sounds normal. No stridor. No respiratory distress. She has no wheezes. She has no rales. She exhibits no tenderness.  Abdominal: Soft. Bowel sounds are normal. She exhibits no distension and no mass. There is no tenderness. There is no rebound and no guarding.  Musculoskeletal: Normal range of motion. She exhibits no edema or tenderness.  Lymphadenopathy:    She has no cervical adenopathy.  Neurological: She is oriented to person, place, and time.  Skin: Skin is warm and dry. No rash noted. She is not diaphoretic. No erythema. No pallor.  Vitals reviewed.   Lab Results  Component Value Date   WBC 6.9 04/05/2015   HGB 13.9 04/05/2015   HCT 41.6 04/05/2015   PLT 289 04/05/2015   GLUCOSE 105* 04/05/2015   CHOL 229* 08/30/2014   TRIG 84.0 08/30/2014   HDL 76.00 08/30/2014   LDLDIRECT 164.7 08/24/2013   LDLCALC 136* 08/30/2014   ALT 15 08/30/2014   AST 22 08/30/2014   NA 139 04/05/2015   K 4.7 04/05/2015   CL 105 04/05/2015   CREATININE  0.97 04/05/2015   BUN 28* 04/05/2015   CO2 27 04/05/2015   TSH 3.96 08/30/2014   HGBA1C 5.5 03/08/2015   MICROALBUR 0.6 08/30/2014    Dg Chest 2 View  04/05/2015   CLINICAL DATA:  Chest pain  EXAM: CHEST  2 VIEW  COMPARISON:  09/26/2012  FINDINGS: The heart size and mediastinal contours are within normal limits. Both lungs are clear. The visualized skeletal structures are unremarkable.  IMPRESSION: No active cardiopulmonary disease.   Electronically Signed   By: Franchot Gallo M.D.   On: 04/05/2015 07:27    Assessment & Plan:   Shresta was seen today for hypertension and abdominal pain.  Diagnoses and all orders for this visit:  IBS (irritable bowel syndrome)- she does not want to treat this.  Visit for screening mammogram -     MM Digital Screening; Future   I am having Ms. Glinski maintain her MULTIVITAL-M, Administrator, Bioflavonoid Products (GRAPE SEED PO), Milk Thistle, Alpha-Lipoic Acid, OVER THE COUNTER MEDICATION, OVER THE COUNTER MEDICATION, Vitamin D-3, OVER THE COUNTER MEDICATION, potassium gluconate, Probiotic Product (PROBIOTIC PO), METHYLCOBALAMIN PO, Resveratrol, OVER THE COUNTER MEDICATION, ACCU-CHEK FASTCLIX LANCET, glucose blood, ACCU-CHEK SMARTVIEW CONTROL, ACCU-CHEK NANO SMARTVIEW, OVER THE COUNTER MEDICATION, OVER THE COUNTER MEDICATION, and potassium gluconate.  Meds ordered this encounter  Medications  . potassium gluconate 595 MG TABS tablet    Sig:      Follow-up: Return in about 6 months (around 10/13/2015).  Scarlette Calico, MD

## 2015-04-15 DIAGNOSIS — R0602 Shortness of breath: Secondary | ICD-10-CM | POA: Diagnosis not present

## 2015-04-18 ENCOUNTER — Encounter: Payer: Self-pay | Admitting: Cardiology

## 2015-04-18 DIAGNOSIS — E668 Other obesity: Secondary | ICD-10-CM | POA: Diagnosis not present

## 2015-04-18 DIAGNOSIS — I493 Ventricular premature depolarization: Secondary | ICD-10-CM | POA: Diagnosis not present

## 2015-04-18 DIAGNOSIS — R0789 Other chest pain: Secondary | ICD-10-CM | POA: Diagnosis not present

## 2015-04-18 DIAGNOSIS — R0602 Shortness of breath: Secondary | ICD-10-CM | POA: Diagnosis not present

## 2015-04-18 DIAGNOSIS — I119 Hypertensive heart disease without heart failure: Secondary | ICD-10-CM | POA: Diagnosis not present

## 2015-04-18 DIAGNOSIS — R9430 Abnormal result of cardiovascular function study, unspecified: Secondary | ICD-10-CM | POA: Diagnosis not present

## 2015-04-18 DIAGNOSIS — E118 Type 2 diabetes mellitus with unspecified complications: Secondary | ICD-10-CM | POA: Diagnosis not present

## 2015-04-18 NOTE — Progress Notes (Signed)
Patient ID: Jade [name], female   DOB: 1956/05/26, 59 y.o.   MRN: [id number]   Jade, Headen [name]  Date of visit:  04/18/2015 DOB:  1955-10-04    Age:  59 yrs. Medical record number:  [id number]     Account number:  [id number] Primary Care Provider: Sanda [name] ____________________________ CURRENT DIAGNOSES  1. Chest pain  2. Abnormal result of cardiovascular function study, unspecified  3. Hypertensive heart disease without heart failure  4. Ventricular premature depolarization  5. Obesity  6. Shortness of breath  7. Type 2 diabetes mellitus with unspecified complications ____________________________ ALLERGIES  Ace Inhibitors, Intolerance-cough  IVP Dye, Iodine Containing, Intolerance-unknown ____________________________ MEDICATIONS  1. multivitamin tablet, 1 p.o. daily ____________________________ CHIEF COMPLAINTS chest pain and recent hospitalization ____________________________ HISTORY OF PRESENT ILLNESS  Patient seen for cardiovascular consultation. She has not been seen in about 8 years and was admitted to the hospital with chest discomfort that occurred after she began to have an exercise program initiated. The chest discomfort was described as a gripping pain would last for several seconds to minutes and occurred at rest. She was hospitalized overnight and was discharged home to have a stress test. She had a Cardiolite done last week and walk only 5 minutes and 30 seconds. She had 1 mm of lateral ST depression and had what looked like an inferior reversible defect.  Since I saw her 8 years ago she has followed a diet and has lost about 80-90 pounds of weight. She was unable to exercise earlier this year because of some knee problems and gain some of the weight back but had been able to exercise without chest pain. She does have diabetes mellitus with peripheral neuropathy and has chronic edema involving her legs. She has gone off of diabetic medicines and is essentially taking no  medications now that she has lost weight. She expresses a significant inversion to going back home medications. She denies PND, orthopnea or significant edema. ____________________________ PAST HISTORY  Past Medical Illnesses:  hypertension, DM-non-insulin dependent, neuropathy, obesity, cholelithiasis;  Cardiovascular Illnesses:  arrhythmia-PACs, arrhythmia-PVCs;  Surgical Procedures:  cholecystectomy (lap), umbilical hernia;  NYHA Classification:  II;  Canadian Angina Classification:  Class 0: Asymptomatic;  Cardiology Procedures-Invasive:  no history of prior cardiac procedures;  Cardiology Procedures-Noninvasive:  holter monitor May 2007, adenosine cardiolite November 2008, echocardiogram October 2008, treadmill Myoview August 2016;  LVEF of 56% documented via nuclear study on 04/14/2015,   ____________________________ CARDIO-PULMONARY TEST DATES EKG Date:  04/14/2015;  Holter/Event Monitor Date: 01/08/2006;  Nuclear Study Date:  04/14/2015;  Echocardiography Date: 02/04/2006;   ____________________________ FAMILY HISTORY Brother -- Brother alive and well Brother -- Brother alive and well Father -- Father dead, Leukemia, Alcoholism, Pacemaker in situ Mother -- Mother dead, Alzheimers disease, Rheumatic fever ____________________________ SOCIAL HISTORY Alcohol Use:  rare;  Smoking:  used to smoke but quit 1990;  Diet:  carbohydrate modified diet;  Lifestyle:  divorced, remarried and 1 son;  Exercise:  no regular exercise;  Occupation:  retired and disabled;  Residence:  lives with husband;   ____________________________ REVIEW OF SYSTEMS General:  weight loss of approximately 50 lbs, obesity  Integumentary:increased dryness of the skin Eyes: wears eye glasses/contact lenses, denies diplopia, glaucoma or visual field defects. Respiratory: history of asthma, sleep apnea, wears CPAP Cardiovascular:  please review HPI Genitourinary-Female: frequency Musculoskeletal:  arthritis of the hip and knee,  lymphedema Neurological:  peripheral neuropathy, headaches Psychiatric:  marital stress Hematological/Immunologic:  seasonal allergies ____________________________ PHYSICAL EXAMINATION VITAL SIGNS  Blood Pressure:  154/80 Sitting, Right arm, large cuff  , 150/78 Standing, Right arm and large cuff   Pulse:  68/min. Weight:  195.00 lbs. Height:  62"BMI: 35  Constitutional:  pleasant white female, in no acute distress, severely obese Skin:  warm and dry to touch, no apparent skin lesions, or masses noted. Head:  normocephalic, normal hair pattern, no masses or tenderness Eyes:  EOMS Intact, PERRLA, C and S clear, Funduscopic exam not done. ENT:  ears, nose and throat reveal no gross abnormalities.  Dentition good. Neck:  supple, no masses, thyromegaly, JVD. Carotid pulses are full and equal bilaterally without bruits. Chest:  normal symmetry, clear to auscultation. Cardiac:  regular rhythm, normal S1 and S2, No S3 or S4, no murmurs, gallops or rubs detected. Abdomen:  abdomen soft,non-tender, no masses, no hepatospenomegaly, or aneurysm noted Peripheral Pulses:  the femoral,dorsalis pedis, and posterior tibial pulses are full and equal bilaterally with no bruits auscultated. Extremities & Back:  1+ edema, erythema of lower legs Neurological:  no gross motor or sensory deficits noted, affect appropriate, oriented x3. ____________________________ MOST RECENT LIPID PANEL 01/07/06  CHOL TOTL 185 mg/dl, LDL 78 NM, HDL 47 mg/dl, TRIGLYCER 811 mg/dl and CHOL/HDL 3.9 (Calc) ____________________________ IMPRESSIONS/PLAN  1. Recent chest pain with atypical features but with an abnormal myocardial perfusion scan 2. Diabetes mellitus with peripheral neuropathy 3. Obesity 4. Hypertensive heart disease  Recommendations:  She describes a reaction to x-ray dye where her arm became black for a week after she received an injection of dye after another doctor started an IV in her. It is unclear whether  this is actually contrast reaction or not. I recommended that she consider cardiac catheterization to assess the extent of coronary disease in a diabetic in light of her abnormal stress test. She was hesitant to consider this and when asked about the options was not really willing to consider medicines as a viable option. She would like to consider whether or not she wants undergo catheterization or further cardiac testing at this time. I told her to call us to let us know what her decision was.  ____________________________ TODAYS ORDERS  1. Return: prn                       ____________________________ Cardiology Physician:  Darden Palmer MD [facility]

## 2015-07-24 ENCOUNTER — Ambulatory Visit (INDEPENDENT_AMBULATORY_CARE_PROVIDER_SITE_OTHER): Payer: Commercial Managed Care - HMO | Admitting: Internal Medicine

## 2015-07-24 ENCOUNTER — Ambulatory Visit (INDEPENDENT_AMBULATORY_CARE_PROVIDER_SITE_OTHER): Payer: Commercial Managed Care - HMO

## 2015-07-24 VITALS — BP 124/68 | HR 102 | Temp 98.6°F | Resp 14 | Ht 62.0 in | Wt 189.0 lb

## 2015-07-24 DIAGNOSIS — M25531 Pain in right wrist: Secondary | ICD-10-CM

## 2015-07-24 NOTE — Patient Instructions (Signed)
  [facility] [address] [place]. [place], [place] [address] [phone] (phone); [phone] (fax)

## 2015-07-24 NOTE — Progress Notes (Signed)
Subjective:  This chart was scribed for Jade Sia, MD by Andrew Au, ED Scribe. This patient was seen in room 1 and the patient's care was started at 2:48 PM.  Patient ID: Jade [name], female    DOB: 1956/04/17, 59 y.o.   MRN: [id number]  HPI   Chief Complaint  Patient presents with  . Arm Injury    pt slipped and fell and caught herself with both hands yesterday  - right hand is very painful    HPI Comments: Jade [name] is a 59 y.o. female who presents to the [facility] complaining of a fall. Pt slipped yesterday on dry leaves and acorns. She slipped forward with her right leg, left knee buckled down, landing on her buttock. She presents today with worsening right wrist pain, worse with movement and touch. She is wearing an old brace to right wrist. She also left  Wrist sprain but states this is not as bad as right wrist.     Past Medical History  Diagnosis Date  . Diabetes mellitus   . COPD (chronic obstructive pulmonary disease) (HCC)   . Neuromuscular disorder (HCC)   . Chronic kidney disease   . Hyperlipidemia   . Hypertension   . Sleep apnea   see hx in chart Drs Jones/Ellison No Known Allergies Prior to Admission medications   Medication Sig Start Date End Date Taking? Authorizing Provider  Blood Glucose Calibration (ACCU-CHEK SMARTVIEW CONTROL) LIQD Test up to TID dx:E11.40 09/13/14  Yes Etta Grandchild, MD  Blood Glucose Monitoring Suppl (ACCU-CHEK NANO SMARTVIEW) W/DEVICE KIT Test up to TID dx:E11.40 09/13/14  Yes Etta Grandchild, MD  Cholecalciferol (VITAMIN D-3) 5000 UNITS TABS Take 5,000 Units by mouth daily.    Yes Historical Provider, MD  OVER THE COUNTER MEDICATION Take 1 tablet by mouth 3 (three) times daily. Berberine Complex w/ Cinnamon, Gymnema, & Fenugreek   Yes Historical Provider, MD  Probiotic Product (PROBIOTIC PO) Take 1 tablet by mouth daily.    Yes Historical Provider, MD  Alpha-Lipoic Acid 600 MG CAPS Take 600 mg by  mouth 2 (two) times daily.     Historical Provider, MD  Bioflavonoid Products (GRAPE SEED PO) Take 300 mg by mouth 3 (three) times daily.    Historical Provider, MD  METHYLCOBALAMIN PO Take 1 tablet by mouth daily.     Historical Provider, MD  Milk Thistle 1000 MG CAPS Take 1,000 mg by mouth 3 (three) times daily.     Historical Provider, MD  Multiple Vitamins-Minerals (MULTIVITAL-M) TABS Take 1 tablet by mouth daily.     Historical Provider, MD  Omega-3 Fatty Acids (FISH OIL) 600 MG CAPS Take 600 mg by mouth 3 (three) times daily.     Historical Provider, MD  potassium gluconate 595 MG TABS tablet  03/17/15   Historical Provider, MD  potassium gluconate 595 MG TABS Take 595 mg by mouth 3 (three) times daily.    Historical Provider, MD  RESVERATROL 100 MG CAPS Take 50 mg by mouth daily.     Historical Provider, MD    Review of Systems [place]    Objective:   Physical Exam  Constitutional: She is oriented to person, place, and time. She appears well-developed and well-nourished. No distress.  HENT:  Head: Normocephalic and atraumatic.  Eyes: Conjunctivae and EOM are normal.  Neck: Neck supple.  Cardiovascular: Normal rate.   Pulmonary/Chest: Effort normal.  Musculoskeletal: Normal range of motion.  Neurological: She is  alert and oriented to person, place, and time.  Skin: Skin is warm and dry.  Psychiatric: She has a normal mood and affect. Her behavior is normal.  Nursing note and vitals reviewed.  Filed Vitals:   07/24/15 1439  BP: 124/68  Pulse: 102  Temp: 98.6 F (37 C)  TempSrc: Oral  Resp: 14  Height: $Remove'5\' 2"'mCxDuDK$  (1.575 m)  Weight: 189 lb (85.73 kg)  SpO2: 96%   [facility] reading (PRIMARY) by .Dr.Doolittle. Right wrist- radius and ulna normal. Navicula suspicious for mid body fx. ? Non displaced fx of the lunate. ? Lunate triquetral disarticulation.  No other signs of bony injury.   Assessment & Plan:   1. Pain, wrist joint, right   suspicious for navicular fracture and possible  carpal dislocation  Sugar tong thumb spica splint applied Follow-up with orthopedics   She also presents the issue of homelessness having just returned [place] from [place] with all her possessions in a truck. She is given the number for the [employer]. She has a history of diabetes with neuropathy and nephropathy,hypertension and coronary artery disease but has become very disenchanted with the medical care she was receiving, discontinued 12 medications, and over the last few years has gone to supplements and feels she is doing a lot better.   By signing my name below, I, [name], attest that this documentation has been prepared under the direction and in the presence of Tami Lin, MD.  Electronically Signed: Thea Alken, ED Scribe. 07/24/2015. 3:05 PM.  I have completed the patient encounter in its entirety as documented by the scribe, with editing by me where necessary. Robert P. Laney Pastor, M.D.

## 2015-07-29 ENCOUNTER — Ambulatory Visit (INDEPENDENT_AMBULATORY_CARE_PROVIDER_SITE_OTHER): Payer: Commercial Managed Care - HMO

## 2015-07-29 ENCOUNTER — Ambulatory Visit (INDEPENDENT_AMBULATORY_CARE_PROVIDER_SITE_OTHER): Payer: Commercial Managed Care - HMO | Admitting: Family Medicine

## 2015-07-29 VITALS — BP 170/94 | HR 75 | Temp 98.4°F | Resp 18 | Ht 62.0 in | Wt 192.0 lb

## 2015-07-29 DIAGNOSIS — Z5900 Homelessness unspecified: Secondary | ICD-10-CM

## 2015-07-29 DIAGNOSIS — S63501D Unspecified sprain of right wrist, subsequent encounter: Secondary | ICD-10-CM

## 2015-07-29 DIAGNOSIS — R600 Localized edema: Secondary | ICD-10-CM | POA: Diagnosis not present

## 2015-07-29 NOTE — Progress Notes (Signed)
[facility] [address] [place] [address] [phone]  Date:  07/29/2015   Name:  Jade [name]   DOB:  [birth year]   MRN:  [id number]  PCP:  Scarlette Calico, MD    Chief Complaint: Needs cast adjusted and Note for [facility]   History of Present Illness:  Jade [name] is a 59 y.o. very pleasant female patient who presents with the following:  She was seen here on 11/27 after she fell and hurt her right wrist. There was concern for fracture so she was placed in a sugartong splint.  Here today because she feels like the splint is hurting and rubbing her hand and wrist.  However x-ray came back negative as below:  RIGHT WRIST - COMPLETE 3+ VIEW  COMPARISON: None.  FINDINGS: No distal radius or ulnar fracture. Radiocarpal joint is intact. No carpal fracture. No soft tissue abnormality.  IMPRESSION: No fracture or dislocation. If continued clinical concern, consider follow-up radiographs in 7-10 days.  She feels like her arm hurts now more from the splint, but is still sore from the original injury  In addition she is currently living in a homeless shelter and would like a note for a better mattress topper and also so that she will not have to leave the shelter early every morning.  She states taht she is not planning to look for a job as she is disabled, and that when she is out and on her feet all day her pedal edema gets a lot worse  Patient Active Problem List   Diagnosis Date Noted  . IBS (irritable bowel syndrome) 04/12/2015  . Hair loss 10/20/2014  . Primary osteoarthritis of left knee 09/29/2014  . Visit for screening mammogram 08/30/2014  . Routine general medical examination at a health care facility 08/30/2014  . Tinea corporis 12/21/2013  . Essential hypertension, benign 12/21/2013  . Low back pain 08/24/2013  . Patient noncompliant with statin medication 02/23/2013  . H/O abnormal Pap smear 10/24/2012  . Osteopenia  09/26/2012  . Diabetic neuropathy, painful ([place]) 05/26/2012  . Diabetes mellitus type 2 with neurological manifestations ([place]) 03/20/2012  . Pure hypercholesterolemia 03/20/2012  . Chronic venous insufficiency 03/20/2012    Past Medical History  Diagnosis Date  . Diabetes mellitus   . COPD (chronic obstructive pulmonary disease) ([place])   . Neuromuscular disorder ([place])   . Chronic kidney disease   . Hyperlipidemia   . Hypertension   . Sleep apnea     Past Surgical History  Procedure Laterality Date  . Hernia repair    . Cholecystectomy    . Tonsillectomy      Social History  Substance Use Topics  . Smoking status: Former Smoker    Quit date: 08/28/1987  . Smokeless tobacco: Never Used  . Alcohol Use: No    Family History  Problem Relation Age of Onset  . Heart disease Father   . Leukemia Father   . Alcohol abuse Son   . Cancer Neg Hx   . Diabetes Neg Hx   . Hearing loss Neg Hx   . Hyperlipidemia Neg Hx   . Hypertension Neg Hx   . Kidney disease Neg Hx   . Stroke Neg Hx   . Other Mother     No Known Allergies  Medication list has been reviewed and updated.  Current Outpatient Prescriptions on File Prior to Visit  Medication Sig Dispense Refill  . Alpha-Lipoic Acid 600 MG  CAPS Take 600 mg by mouth 2 (two) times daily.     Marland Kitchen Bioflavonoid Products (GRAPE SEED PO) Take 300 mg by mouth 3 (three) times daily.    . Blood Glucose Calibration (ACCU-CHEK SMARTVIEW CONTROL) LIQD Test up to TID dx:E11.40 3 each 3  . Blood Glucose Monitoring Suppl (ACCU-CHEK NANO SMARTVIEW) W/DEVICE KIT Test up to TID dx:E11.40 1 kit 3  . Cholecalciferol (VITAMIN D-3) 5000 UNITS TABS Take 5,000 Units by mouth daily.     Marland Kitchen glucose blood (ACCU-CHEK SMARTVIEW) test strip Test up to TID dx:E11.40 300 each 3  . Lancets Misc. (ACCU-CHEK FASTCLIX LANCET) KIT Test up to TID dx:E11.40 1 kit 2  . METHYLCOBALAMIN PO Take 1 tablet by mouth daily.     . Milk Thistle 1000 MG CAPS Take 1,000 mg by  mouth 3 (three) times daily.     . Multiple Vitamins-Minerals (MULTIVITAL-M) TABS Take 1 tablet by mouth daily.     . Omega-3 Fatty Acids (FISH OIL) 600 MG CAPS Take 600 mg by mouth 3 (three) times daily.     Marland Kitchen OVER THE COUNTER MEDICATION Take 800 mg by mouth 5 (five) times daily. Methylfolate    . OVER THE COUNTER MEDICATION Take 50 mg by mouth 2 (two) times daily. P5P    . OVER THE COUNTER MEDICATION Take 133 mg by mouth 3 (three) times daily. Triple Magnesium    . OVER THE COUNTER MEDICATION Take 750 mg by mouth 2 (two) times daily. Olive Leaf Extract    . OVER THE COUNTER MEDICATION Take 1 tablet by mouth 3 (three) times daily. Beetroot    . OVER THE COUNTER MEDICATION Take 1 tablet by mouth 3 (three) times daily. Berberine Complex w/ Cinnamon, Gymnema, & Fenugreek    . potassium gluconate 595 MG TABS tablet     . potassium gluconate 595 MG TABS Take 595 mg by mouth 3 (three) times daily.    . Probiotic Product (PROBIOTIC PO) Take 1 tablet by mouth daily.     Marland Kitchen RESVERATROL 100 MG CAPS Take 50 mg by mouth daily.      No current facility-administered medications on file prior to visit.    Review of Systems:  As per HPI- otherwise negative.   Physical Examination: Filed Vitals:   07/29/15 1652 07/29/15 1656  BP: 172/92 170/94  Pulse: 75   Temp: 98.4 F (36.9 C)   Resp: 18    Filed Vitals:   07/29/15 1652  Height: _0  (1.575 m)  Weight: 192 lb (87.091 kg)   Body mass index is 35.11 kg/(m^2). Ideal Body Weight: Weight in (lb) to have BMI = 25: 136.4  GEN: WDWN, NAD, Non-toxic, A & O x 3, obese, looks well HEENT: Atraumatic, Normocephalic. Neck supple. No masses, No LAD. Ears and Nose: No external deformity. CV: RRR, No M/G/R. No JVD. No thrill. No extra heart sounds. PULM: CTA B, no wheezes, crackles, rhonchi. No retractions. No resp. distress. No accessory muscle use. EXTR: No c/c.  Edema of both ankles which pt states is chronic NEURO Normal gait.  PSYCH: Normally  interactive. Conversant. Not depressed or anxious appearing.  Calm demeanor.  Right wrist is tender and bruised.  Removed splint- no abrasion or skin breakdown.  Normal ROM of the elbow.  ROM of the hand and wrist is limited by tenderness.  Strength limited by tenderness   [facility] reading (PRIMARY) by  Dr. Lorelei Pont. Right wrist: negative RIGHT WRIST - COMPLETE 3+ VIEW  COMPARISON: None.  FINDINGS: There is no evidence of fracture or dislocation. There is an intraosseous cyst in the scaphoid. Soft tissues are unremarkable.  IMPRESSION: No acute osseous injury of the left wrist. If there is clinical concern regarding a radiographically occult scaphoid fracture, snuff box tenderness or ligamentous injury, then further assessment with MRI is recommended.  Placed in a velcro thumb spica splint- this felt good to her   Assessment and Plan: Right wrist sprain, subsequent encounter - Plan: DG Wrist Complete Right  Bilateral edema of lower extremity  Homelessness  Here today to recheck a right wrist injury.  No definite evidence of a fracture and she is uncomfortable in sugar tong splint.  Placed in a removable velcro splint.  She will follow-up if not better over the next 1-2 weeks Gave note for shelter as per her request  Signed Lamar Blinks, MD  Called and [facility] regarding OR of her wrist.  No apparent fracture.  If doing well over the next couple of weeks all is likely ok.  Let me know if sx persist

## 2015-07-29 NOTE — Patient Instructions (Signed)
I think that your x-ray looks ok but I will be in touch with the radiologists report- if negative you can just use the splint as needed for protection, and can start range of motion as tolerated  Even if your wrist film is read as negative please let me know if your wrist is not improving over the next 2 weeks

## 2015-12-07 ENCOUNTER — Telehealth: Payer: Self-pay | Admitting: Internal Medicine

## 2015-12-07 NOTE — Telephone Encounter (Signed)
Pt request authorization form fill out and fax to [facility] for diabetic shoes so they can pay for it. Please help

## 2015-12-08 NOTE — Telephone Encounter (Signed)
LMOVM informing pt. We have not received a form. 1. We have to receive form or pt has to bring in. 2. Pt will need OV so she can have a foot exam. This was not completed last yr.

## 2016-09-10 ENCOUNTER — Encounter: Payer: Self-pay | Admitting: Internal Medicine

## 2018-03-21 ENCOUNTER — Other Ambulatory Visit: Payer: Self-pay

## 2018-03-21 ENCOUNTER — Emergency Department (HOSPITAL_COMMUNITY): Payer: Medicare HMO

## 2018-03-21 ENCOUNTER — Encounter (HOSPITAL_COMMUNITY): Payer: Self-pay | Admitting: Emergency Medicine

## 2018-03-21 ENCOUNTER — Inpatient Hospital Stay (HOSPITAL_COMMUNITY)
Admission: EM | Admit: 2018-03-21 | Discharge: 2018-03-24 | DRG: 065 | Disposition: A | Discharge status: Home / Self Care | Payer: Medicare HMO | Attending: Internal Medicine | Admitting: Internal Medicine

## 2018-03-21 DIAGNOSIS — I6381 Other cerebral infarction due to occlusion or stenosis of small artery: Secondary | ICD-10-CM | POA: Diagnosis present

## 2018-03-21 DIAGNOSIS — E876 Hypokalemia: Secondary | ICD-10-CM | POA: Diagnosis present

## 2018-03-21 DIAGNOSIS — R200 Anesthesia of skin: Secondary | ICD-10-CM | POA: Diagnosis present

## 2018-03-21 DIAGNOSIS — Z8249 Family history of ischemic heart disease and other diseases of the circulatory system: Secondary | ICD-10-CM

## 2018-03-21 DIAGNOSIS — I639 Cerebral infarction, unspecified: Secondary | ICD-10-CM | POA: Diagnosis present

## 2018-03-21 DIAGNOSIS — E1140 Type 2 diabetes mellitus with diabetic neuropathy, unspecified: Secondary | ICD-10-CM | POA: Diagnosis present

## 2018-03-21 DIAGNOSIS — Z91048 Other nonmedicinal substance allergy status: Secondary | ICD-10-CM

## 2018-03-21 DIAGNOSIS — M797 Fibromyalgia: Secondary | ICD-10-CM | POA: Diagnosis present

## 2018-03-21 DIAGNOSIS — I6522 Occlusion and stenosis of left carotid artery: Secondary | ICD-10-CM | POA: Diagnosis present

## 2018-03-21 DIAGNOSIS — I872 Venous insufficiency (chronic) (peripheral): Secondary | ICD-10-CM | POA: Diagnosis present

## 2018-03-21 DIAGNOSIS — I10 Essential (primary) hypertension: Secondary | ICD-10-CM

## 2018-03-21 DIAGNOSIS — J449 Chronic obstructive pulmonary disease, unspecified: Secondary | ICD-10-CM | POA: Diagnosis present

## 2018-03-21 DIAGNOSIS — Z79899 Other long term (current) drug therapy: Secondary | ICD-10-CM

## 2018-03-21 DIAGNOSIS — Z7982 Long term (current) use of aspirin: Secondary | ICD-10-CM | POA: Diagnosis not present

## 2018-03-21 DIAGNOSIS — I129 Hypertensive chronic kidney disease with stage 1 through stage 4 chronic kidney disease, or unspecified chronic kidney disease: Secondary | ICD-10-CM | POA: Diagnosis present

## 2018-03-21 DIAGNOSIS — Z9049 Acquired absence of other specified parts of digestive tract: Secondary | ICD-10-CM

## 2018-03-21 DIAGNOSIS — Z87891 Personal history of nicotine dependence: Secondary | ICD-10-CM | POA: Diagnosis not present

## 2018-03-21 DIAGNOSIS — G4733 Obstructive sleep apnea (adult) (pediatric): Secondary | ICD-10-CM | POA: Diagnosis present

## 2018-03-21 DIAGNOSIS — N182 Chronic kidney disease, stage 2 (mild): Secondary | ICD-10-CM | POA: Diagnosis present

## 2018-03-21 DIAGNOSIS — R29700 NIHSS score 0: Secondary | ICD-10-CM | POA: Diagnosis present

## 2018-03-21 DIAGNOSIS — Z713 Dietary counseling and surveillance: Secondary | ICD-10-CM

## 2018-03-21 DIAGNOSIS — E669 Obesity, unspecified: Secondary | ICD-10-CM

## 2018-03-21 DIAGNOSIS — I161 Hypertensive emergency: Secondary | ICD-10-CM | POA: Diagnosis present

## 2018-03-21 DIAGNOSIS — Z6832 Body mass index (BMI) 32.0-32.9, adult: Secondary | ICD-10-CM

## 2018-03-21 DIAGNOSIS — E66811 Obesity, class 1: Secondary | ICD-10-CM

## 2018-03-21 DIAGNOSIS — E785 Hyperlipidemia, unspecified: Secondary | ICD-10-CM | POA: Diagnosis present

## 2018-03-21 DIAGNOSIS — M109 Gout, unspecified: Secondary | ICD-10-CM | POA: Diagnosis present

## 2018-03-21 DIAGNOSIS — E1122 Type 2 diabetes mellitus with diabetic chronic kidney disease: Secondary | ICD-10-CM | POA: Diagnosis present

## 2018-03-21 DIAGNOSIS — E1149 Type 2 diabetes mellitus with other diabetic neurological complication: Secondary | ICD-10-CM

## 2018-03-21 DIAGNOSIS — I63232 Cerebral infarction due to unspecified occlusion or stenosis of left carotid arteries: Secondary | ICD-10-CM | POA: Diagnosis not present

## 2018-03-21 HISTORY — DX: Personal history of other diseases of the musculoskeletal system and connective tissue: Z87.39

## 2018-03-21 HISTORY — DX: Migraine, unspecified, not intractable, without status migrainosus: G43.909

## 2018-03-21 HISTORY — DX: Cerebral infarction, unspecified: I63.9

## 2018-03-21 HISTORY — DX: Obstructive sleep apnea (adult) (pediatric): G47.33

## 2018-03-21 HISTORY — DX: Type 2 diabetes mellitus without complications: E11.9

## 2018-03-21 HISTORY — DX: Pneumonia, unspecified organism: J18.9

## 2018-03-21 HISTORY — DX: Fibromyalgia: M79.7

## 2018-03-21 HISTORY — DX: Unspecified osteoarthritis, unspecified site: M19.90

## 2018-03-21 LAB — COMPREHENSIVE METABOLIC PANEL
ALK PHOS: 60 U/L (ref 38–126)
ALT: 16 U/L (ref 0–44)
ANION GAP: 8 (ref 5–15)
AST: 23 U/L (ref 15–41)
Albumin: 4 g/dL (ref 3.5–5.0)
BILIRUBIN TOTAL: 0.7 mg/dL (ref 0.3–1.2)
BUN: 18 mg/dL (ref 8–23)
CALCIUM: 9.5 mg/dL (ref 8.9–10.3)
CO2: 27 mmol/L (ref 22–32)
CREATININE: 1.01 mg/dL — AB (ref 0.44–1.00)
Chloride: 108 mmol/L (ref 98–111)
GFR, EST NON AFRICAN AMERICAN: 58 mL/min — AB (ref >60)
Glucose, Bld: 151 mg/dL — ABNORMAL HIGH (ref 70–99)
Potassium: 4.7 mmol/L (ref 3.5–5.1)
SODIUM: 143 mmol/L (ref 135–145)
TOTAL PROTEIN: 6.9 g/dL (ref 6.5–8.1)

## 2018-03-21 LAB — PROTIME-INR
INR: 0.9
Prothrombin Time: 12.1 seconds (ref 11.4–15.2)

## 2018-03-21 LAB — CBC
HEMATOCRIT: 42.4 % (ref 36.0–46.0)
HEMATOCRIT: 44.6 % (ref 36.0–46.0)
Hemoglobin: 13.9 g/dL (ref 12.0–15.0)
Hemoglobin: 14.4 g/dL (ref 12.0–15.0)
MCH: 31.4 pg (ref 26.0–34.0)
MCH: 30.8 pg (ref 26.0–34.0)
MCHC: 32.8 g/dL (ref 30.0–36.0)
MCHC: 32.3 g/dL (ref 30.0–36.0)
MCV: 95.9 fL (ref 78.0–100.0)
MCV: 95.5 fL (ref 78.0–100.0)
Platelets: 229 10*3/uL (ref 150–400)
Platelets: 266 10*3/uL (ref 150–400)
RBC: 4.42 MIL/uL (ref 3.87–5.11)
RBC: 4.67 MIL/uL (ref 3.87–5.11)
RDW: 12.6 % (ref 11.5–15.5)
RDW: 12.8 % (ref 11.5–15.5)
WBC: 7.4 10*3/uL (ref 4.0–10.5)
WBC: 7.6 10*3/uL (ref 4.0–10.5)

## 2018-03-21 LAB — TSH: TSH: 1.755 u[IU]/mL (ref 0.350–4.500)

## 2018-03-21 LAB — DIFFERENTIAL
Abs Immature Granulocytes: 0.1 10*3/uL (ref 0.0–0.1)
BASOS ABS: 0.1 10*3/uL (ref 0.0–0.1)
Basophils Relative: 1 %
EOS PCT: 1 %
Eosinophils Absolute: 0.1 10*3/uL (ref 0.0–0.7)
Immature Granulocytes: 1 %
LYMPHS PCT: 18 %
Lymphs Abs: 1.4 10*3/uL (ref 0.7–4.0)
MONO ABS: 0.4 10*3/uL (ref 0.1–1.0)
MONOS PCT: 6 %
NEUTROS ABS: 5.4 10*3/uL (ref 1.7–7.7)
Neutrophils Relative %: 73 %

## 2018-03-21 LAB — I-STAT CHEM 8, ED
BUN: 22 mg/dL (ref 8–23)
Calcium, Ion: 1.21 mmol/L (ref 1.15–1.40)
Chloride: 107 mmol/L (ref 98–111)
Creatinine, Ser: 0.8 mg/dL (ref 0.44–1.00)
GLUCOSE: 143 mg/dL — AB (ref 70–99)
HEMATOCRIT: 39 % (ref 36.0–46.0)
HEMOGLOBIN: 13.3 g/dL (ref 12.0–15.0)
POTASSIUM: 4.5 mmol/L (ref 3.5–5.1)
Sodium: 143 mmol/L (ref 135–145)
TCO2: 28 mmol/L (ref 22–32)

## 2018-03-21 LAB — GLUCOSE, CAPILLARY: GLUCOSE-CAPILLARY: 85 mg/dL (ref 70–99)

## 2018-03-21 LAB — CREATININE, SERUM: CREATININE: 0.91 mg/dL (ref 0.44–1.00)

## 2018-03-21 LAB — ETHANOL

## 2018-03-21 LAB — I-STAT TROPONIN, ED: TROPONIN I, POC: 0 ng/mL (ref 0.00–0.08)

## 2018-03-21 LAB — APTT: APTT: 25 s (ref 24–36)

## 2018-03-21 MED ORDER — STROKE: EARLY STAGES OF RECOVERY BOOK
Freq: Once | Status: AC
  Administered 2018-03-21: 21:00:00
  Filled 2018-03-21: qty 1

## 2018-03-21 MED ORDER — ATORVASTATIN CALCIUM 80 MG PO TABS
80.0000 mg | ORAL_TABLET | Freq: Every day | ORAL | Status: DC
  Administered 2018-03-22 – 2018-03-23 (×2): 80 mg via ORAL
  Filled 2018-03-21 (×2): qty 1

## 2018-03-21 MED ORDER — DEXTROSE-NACL 5-0.45 % IV SOLN
INTRAVENOUS | Status: DC
  Administered 2018-03-21 – 2018-03-22 (×2): via INTRAVENOUS

## 2018-03-21 MED ORDER — ASPIRIN EC 81 MG PO TBEC
81.0000 mg | DELAYED_RELEASE_TABLET | Freq: Every day | ORAL | Status: DC
  Administered 2018-03-22 – 2018-03-24 (×3): 81 mg via ORAL
  Filled 2018-03-21 (×3): qty 1

## 2018-03-21 MED ORDER — ACETAMINOPHEN 650 MG RE SUPP: 650.0000 mg | RECTAL | Status: DC | PRN

## 2018-03-21 MED ORDER — ACETAMINOPHEN 160 MG/5ML PO SOLN: 650.0000 mg | ORAL | Status: DC | PRN

## 2018-03-21 MED ORDER — HEPARIN SODIUM (PORCINE) 5000 UNIT/ML IJ SOLN
5000.0000 [IU] | Freq: Three times a day (TID) | INTRAMUSCULAR | Status: DC
  Administered 2018-03-21 – 2018-03-24 (×7): 5000 [IU] via SUBCUTANEOUS
  Filled 2018-03-21 (×8): qty 1

## 2018-03-21 MED ORDER — GADOBENATE DIMEGLUMINE 529 MG/ML IV SOLN
15.0000 mL | Freq: Once | INTRAVENOUS | Status: AC
  Administered 2018-03-21: 15 mL via INTRAVENOUS

## 2018-03-21 MED ORDER — ACETAMINOPHEN 325 MG PO TABS: 650.0000 mg | ORAL_TABLET | ORAL | Status: DC | PRN

## 2018-03-21 MED ORDER — SENNOSIDES-DOCUSATE SODIUM 8.6-50 MG PO TABS: 1.0000 | ORAL_TABLET | Freq: Every evening | ORAL | Status: DC | PRN

## 2018-03-21 NOTE — H&P (Addendum)
History and Physical    Jade [name] AXE:[id number] DOB: 06-27-1956 DOA: 03/21/2018  PCP: Janith Lima, MD  Patient coming from: home   Chief Complaint: numbness  HPI: Jade [name] is a 62 y.o. female with medical history significant for t2dm, morbid obesity, OSA (not on bipap, says "resolved"), HTN, COPD (says told had this diagnosis many years ago but has not been on inhalers for a long time and says breathing is fine; no hx smoking), ckd2, who presents with the above.  Of note no medical care for many years; presented to a new pcp 2-3 wks ago and was started on antihypertensives for hypertensive urgency (systolic bp 110R).  Patient says that after starting to take losartan developed an episode of inability to use tongue or speak that lasted about 15 minutes. This was 2-3 wks ago. Then about a week ago developed right sided numbness that started on face and spread to whole right side that persists to today. Not full numbness but decreased sensation. No difficulty speaking no motor weakness. Then when awoke this morning developed left sided facial numbness that has spread to left arm and leg. Again no speech, swallow, or motor difficulties. No vision changes. No hx MI/CVA. Non-smoker. Says diabetes is diet-controlled. Denies drug use. No seizure-like activities.   ED Course: imaging, labs, neuro consult ([place])  Review of Systems: As per HPI otherwise 10 point review of systems negative.    Past Medical History:  Diagnosis Date  . Chronic kidney disease   . COPD (chronic obstructive pulmonary disease) ([place])   . Diabetes mellitus   . Hyperlipidemia   . Hypertension   . Neuromuscular disorder ([place])   . Sleep apnea     Past Surgical History:  Procedure Laterality Date  . CHOLECYSTECTOMY    . HERNIA REPAIR    . TONSILLECTOMY       reports that she quit smoking about 30 years ago. She has never used smokeless tobacco. She reports that she does not drink alcohol or use  drugs.  No Known Allergies  Family History  Problem Relation Age of Onset  . Heart disease Father   . Leukemia Father   . Alcohol abuse Son   . Other Mother   . Cancer Neg Hx   . Diabetes Neg Hx   . Hearing loss Neg Hx   . Hyperlipidemia Neg Hx   . Hypertension Neg Hx   . Kidney disease Neg Hx   . Stroke Neg Hx     Prior to Admission medications   Medication Sig Start Date End Date Taking? Authorizing Provider  Cholecalciferol (VITAMIN D-3) 5000 UNITS TABS Take 5,000 Units by mouth daily.    Yes [provider]  cloNIDine (CATAPRES) 0.1 MG tablet Take 0.1 mg by mouth daily. 03/14/18  Yes [provider]  Coenzyme Q10 (COQ-10) 100 MG CAPS Take 100 mg by mouth daily.   Yes [provider]  furosemide (LASIX) 20 MG tablet Take 20 mg by mouth daily. 03/14/18  Yes [provider]  Lysine 1000 MG TABS Take 1,000 mg by mouth daily.   Yes [provider]  METHYLCOBALAMIN PO Take 1 tablet by mouth daily.    Yes [provider]  Multiple Vitamins-Minerals (MULTIVITAL-M) TABS Take 1 tablet by mouth daily.    Yes [provider]  Omega-3 Fatty Acids (FISH OIL) 600 MG CAPS Take 600 mg by mouth 3 (three) times daily.    Yes [provider]  OVER  THE COUNTER MEDICATION Take 50 mg by mouth 2 (two) times daily. P5P   Yes [provider]  OVER THE COUNTER MEDICATION Take 133 mg by mouth 3 (three) times daily. Triple Magnesium   Yes [provider]  OVER THE COUNTER MEDICATION Take 750 mg by mouth 2 (two) times daily. Olive Leaf Extract   Yes [provider]  potassium gluconate 595 MG TABS Take 595 mg by mouth 3 (three) times daily.   Yes [provider]  Blood Glucose Calibration (ACCU-CHEK SMARTVIEW CONTROL) LIQD Test up to TID dx:E11.40 09/13/14   Janith Lima, MD  Blood Glucose Monitoring Suppl (ACCU-CHEK NANO SMARTVIEW) W/DEVICE KIT Test up to TID dx:E11.40 09/13/14   Janith Lima, MD    glucose blood (ACCU-CHEK SMARTVIEW) test strip Test up to TID dx:E11.40 09/13/14   Janith Lima, MD  Lancets Misc. (ACCU-CHEK FASTCLIX LANCET) KIT Test up to TID dx:E11.40 09/13/14   Janith Lima, MD  OVER THE COUNTER MEDICATION Take 1 tablet by mouth daily. Beet root    [provider]    Physical Exam: Vitals:   03/21/18 1200 03/21/18 1215 03/21/18 1300 03/21/18 1415  BP: (!) 169/63 (!) 163/70 (!) 147/68 (!) 165/61  Pulse: (!) 46 (!) 43 (!) 42 (!) 46  Resp: '14 14 12 13  '$ Temp:      TempSrc:      SpO2: 100% 100% 100% 97%  Weight:      Height:        Constitutional: No acute distress Head: Atraumatic Eyes: Conjunctiva clear ENM: Moist mucous membranes. Normal dentition.  Neck: Supple Respiratory: Clear to auscultation bilaterally, no wheezing/rales/rhonchi. Normal respiratory effort. No accessory muscle use. . Cardiovascular: Regular rate and rhythm. No murmurs/rubs/gallops. Abdomen: Non-tender, non-distended. No masses. No rebound or guarding. Positive bowel sounds. Musculoskeletal: No joint deformity upper and lower extremities. Normal ROM, no contractures. Normal muscle tone.  Skin: No rashes, lesions, or ulcers.  Extremities: No peripheral edema. Palpable peripheral pulses. Neurologic: cn2-12 grossly intact. 5/5 upper/lower strength. Light tough sensation diminished throughout r>l. Gait not assessed. Speech fluent. Psychiatric: Normal insight and judgement.   Labs on Admission: I have personally reviewed following labs and imaging studies  CBC: Recent Labs  Lab 03/21/18 1134 03/21/18 1159  WBC 7.4  --   NEUTROABS 5.4  --   HGB 13.9 13.3  HCT 42.4 39.0  MCV 95.9  --   PLT 229  --    Basic Metabolic Panel: Recent Labs  Lab 03/21/18 1134 03/21/18 1159  NA 143 143  K 4.7 4.5  CL 108 107  CO2 27  --   GLUCOSE 151* 143*  BUN 18 22  CREATININE 1.01* 0.80  CALCIUM 9.5  --    GFR: Estimated Creatinine Clearance: 71.5 mL/min (by C-G formula based  on SCr of 0.8 mg/dL). Liver Function Tests: Recent Labs  Lab 03/21/18 1134  AST 23  ALT 16  ALKPHOS 60  BILITOT 0.7  PROT 6.9  ALBUMIN 4.0   No results for input(s): LIPASE, AMYLASE in the last 168 hours. No results for input(s): AMMONIA in the last 168 hours. Coagulation Profile: Recent Labs  Lab 03/21/18 1134  INR 0.90   Cardiac Enzymes: No results for input(s): CKTOTAL, CKMB, CKMBINDEX, TROPONINI in the last 168 hours. BNP (last 3 results) No results for input(s): PROBNP in the last 8760 hours. HbA1C: No results for input(s): HGBA1C in the last 72 hours. CBG: No results for input(s): GLUCAP in the  last 168 hours. Lipid Profile: No results for input(s): CHOL, HDL, LDLCALC, TRIG, CHOLHDL, LDLDIRECT in the last 72 hours. Thyroid Function Tests: No results for input(s): TSH, T4TOTAL, FREET4, T3FREE, THYROIDAB in the last 72 hours. Anemia Panel: No results for input(s): VITAMINB12, FOLATE, FERRITIN, TIBC, IRON, RETICCTPCT in the last 72 hours. Urine analysis:    Component Value Date/Time   COLORURINE YELLOW 03/08/2015 0855   APPEARANCEUR CLEAR 03/08/2015 0855   LABSPEC 1.015 03/08/2015 0855   PHURINE 6.0 03/08/2015 0855   GLUCOSEU NEGATIVE 03/08/2015 0855   HGBUR NEGATIVE 03/08/2015 0855   BILIRUBINUR NEGATIVE 03/08/2015 0855   KETONESUR NEGATIVE 03/08/2015 0855   PROTEINUR 30 (A) 06/16/2010 2255   UROBILINOGEN 0.2 03/08/2015 0855   NITRITE NEGATIVE 03/08/2015 0855   LEUKOCYTESUR NEGATIVE 03/08/2015 0855    Radiological Exams on Admission: Ct Head Wo Contrast  Result Date: 03/21/2018 CLINICAL DATA:  62 year old female with nausea, vomiting and dizziness EXAM: CT HEAD WITHOUT CONTRAST TECHNIQUE: Contiguous axial images were obtained from the base of the skull through the vertex without intravenous contrast. COMPARISON:  None. FINDINGS: Brain: No evidence of acute infarction, hemorrhage, hydrocephalus, extra-axial collection or mass lesion/mass effect. Moderate  cerebral cortical volume loss, advanced for age. Moderate periventricular white matter hypoattenuation most consistent with sequelae of chronic microvascular ischemic white matter disease. Vascular: No hyperdense vessel or unexpected calcification. Skull: Normal. Negative for fracture or focal lesion. Sinuses/Orbits: Small amount of frothy secretions present within the left sphenoid air cell. Otherwise, the paranasal sinuses are clear. Other: None. IMPRESSION: 1. No acute intracranial abnormality. 2. Age advanced cerebral cortical volume loss. 3. 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 Electronically Signed   By: Jacqulynn Cadet M.D.   On: 03/21/2018 11:34   Mr Jeri Cos And Wo Contrast  Result Date: 03/21/2018 CLINICAL DATA:  Numbness or tingling, paresthesias. Episode of right-sided weakness 6 days ago which resolved. EXAM: MRI HEAD WITHOUT AND WITH CONTRAST TECHNIQUE: Multiplanar, multiecho pulse sequences of the brain and surrounding structures were obtained without and with intravenous contrast. CONTRAST:  66m MULTIHANCE GADOBENATE DIMEGLUMINE 529 MG/ML IV SOLN COMPARISON:  CT head without contrast 03/21/2018 FINDINGS: Brain: 6 mm acute nonhemorrhagic infarct is present within the posterior limb of the left internal capsule and lateral thalamus. No acute hemorrhage or mass lesion is present. No other acute infarct is present. T2 signal changes are associated with the area of restricted diffusion. Dilated perivascular spaces are present within the basal ganglia. Confluent periventricular and subcortical T2 hyperintensities are noted bilaterally. These are moderately advanced for age. White matter changes extend into the brainstem. The brainstem and cerebellum are otherwise unremarkable. 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 Postcontrast images demonstrate a 9 mm focus enhancement along the inferior aspect of the left frontal operculum. This is along the cortex  without a definite mass lesion. No other foci of enhancement are present. Vascular: Flow is present in the major intracranial arteries. Skull and upper cervical spine: Craniocervical junction is within normal limits. Upper cervical spine is normal. Sinuses/Orbits: Mild mucosal thickening is present inferiorly in the left maxillary sinus. The paranasal sinuses and mastoid air cells are otherwise clear. There is some fluid within the nasopharynx. Globes and orbits are within normal limits. IMPRESSION: 1. 6 mm acute nonhemorrhagic infarct along the posterior limb of the right internal capsule or lateral thalamus correlates with right-sided weakness. 2. Focal subcortical enhancement the left frontal operculum. This may represent a early chronic infarct. There is no definite mass  lesion. Recommend follow-up MRI without and with contrast in 1-2 months. 3. Atrophy and white matter disease are advanced for age. This likely reflects the sequela of chronic microvascular ischemia. Electronically Signed   By: [place] Morelle M.D.   On: 03/21/2018 16:50    EKG: Independently reviewed. LAD, qrs widened (121) from prior. LAD is not new.  Assessment/Plan Active Problems:   Diabetes mellitus type 2 with neurological manifestations ([place])   Diabetic neuropathy, painful ([facility])   Essential hypertension, benign   CVA (cerebral vascular accident) ([place])   Obesity, Class III, BMI 40-49.9 (morbid obesity) ([place])   # CVA - acute left internal capsule infarct seen on MRI with new left-sided numbness present when awoke from bed. Glucose > 100. BP moderately elevated. No hemorrhage seen on MRI. Recent outpatient carotid dopplers, results not available currently. Sinus rhythm. Neuro consulted, recs pending. - head to 30 degrees - permissive htn, holding home clonidine/furosemide/losartan - start atorvastatin - antiplatelets per neuro - further imaging per neuro recs - tele - ot/pt - slp swallow eval - npo - fluids d5  1/2ns @ 100 ml/hr - a1c, lipids, tsh - repeat ecg in AM  # T2DM - pt says diet controlled. Glucose not severely elevated - f/u a1c - fasting daily am glucose  # HTN - here moderately elevated - permissive htn as above, holding bp meds as above  # OSA - says doesn't have any more; probably merits re-eval as outpt  # COPD - unclear if ever formally diagnosed. Asymptomatic  DVT prophylaxis: heparin, scds Code Status: full  Family Communication: husband Jade [name] [phone]  Disposition Plan: tbd  Consults called: neuro  Admission status: tele    Desma Maxim MD [facility] Pager [phone]  If 7PM-7AM, please contact night-coverage [email or web address] Password [facility]  03/21/2018, 6:46 PM

## 2018-03-21 NOTE — ED Provider Notes (Signed)
[facility] EMERGENCY DEPARTMENT Provider Note   CSN: [id number] Arrival date & time: 03/21/18  1038     History   Chief Complaint Chief Complaint  Patient presents with  . Nausea  . Emesis  . Dizziness    HPI Jade [name] is a 62 y.o. female.  HPI  The patient is a 62 year old female, she has a known history of hypertension, COPD, also has a history of what she describes as diabetes, she has had chronic neuropathy of her feet for over 10 years and has had some worsening hypertension which has recently been treated by her family doctor, this is a new doctor to her, he started her on clonidine and losartan within the last couple of weeks however she is continued to have high blood pressure.  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  This resolved after 15 minutes, within the next week she developed diffuse right-sided numbness including her face arm and leg, this is been persistent over the last week and for which she is followed up with her doctor who ordered a cardiac ultrasound as well as carotid ultrasounds.  These tests have not been performed and she is not sure of the results.  (Ultrasound of neck was performed but no results yet).  She reports she has not had any imaging of her brain.  Today she noticed that she had left-sided numbness of her face and upper extremity as well and multiple episodes of vomiting.  She feels dizzy but she states she chronically feels dizzy like she has no balance.  She has not been seen by neurologist  She has not been seen by any specialist regarding this,  She has not had any advanced neuro imaging.  She arrives by paramedic transport, they give additional history that she was hypertensive, she vomited a couple of times but otherwise had a nonfocal neurologic exam prehospital.  Past Medical History:  Diagnosis  Date  . Arthritis    "back, knees, some in my left hip" (03/21/2018)  . Chronic kidney disease    "was told I had 25% kidney function in early 2012" (03/21/2018)  . COPD (chronic obstructive pulmonary disease) ([place])   . CVA (cerebral vascular accident) ([place]) 03/21/2018   "numb all over; head to toe" (03/21/2018)  . Fibromyalgia   . History of gout   . Hyperlipidemia   . Hypertension   . Migraine    "used to get them monthly during menopause" (03/21/2018)  . Neuromuscular disorder ([place])   . OSA (obstructive sleep apnea)    "don't tolerate the machine" (03/21/2018)  . Pneumonia ~ 2007  . Type 2 diabetes, diet controlled [facility])     Patient Active Problem List   Diagnosis Date Noted  . CVA (cerebral vascular accident) ([place]) 03/21/2018  . Obesity, Class III, BMI 40-49.9 (morbid obesity) ([place]) 03/21/2018  . IBS (irritable bowel syndrome) 04/12/2015  . Hair loss 10/20/2014  . Primary osteoarthritis of left knee 09/29/2014  . Visit for screening mammogram 08/30/2014  . Routine general medical examination at a health care facility 08/30/2014  . Tinea corporis 12/21/2013  . Essential hypertension, benign 12/21/2013  . Low back pain 08/24/2013  . Patient noncompliant with statin medication 02/23/2013  . H/O abnormal Pap smear 10/24/2012  . Osteopenia 09/26/2012  . Diabetic neuropathy, painful ([place]) 05/26/2012  . Diabetes mellitus  type 2 with neurological manifestations ([place]) 03/20/2012  . Pure hypercholesterolemia 03/20/2012  . Chronic venous insufficiency 03/20/2012    Past Surgical History:  Procedure Laterality Date  . HERNIA REPAIR    . LAPAROSCOPIC CHOLECYSTECTOMY  06/2007   "no UHR w/this" (03/21/2018)  . TONSILLECTOMY    . UMBILICAL HERNIA REPAIR  2009     OB History    Gravida  3   Para  1   Term  1   Preterm      AB  2   Living  1     SAB  2   TAB      Ectopic      Multiple      Live Births  1            Home Medications    Prior to Admission  medications   Medication Sig Start Date End Date Taking? Authorizing Provider  Cholecalciferol (VITAMIN D-3) 5000 UNITS TABS Take 5,000 Units by mouth daily.    Yes [provider]  cloNIDine (CATAPRES) 0.1 MG tablet Take 0.1 mg by mouth daily. 03/14/18  Yes [provider]  Coenzyme Q10 (COQ-10) 100 MG CAPS Take 100 mg by mouth daily.   Yes [provider]  furosemide (LASIX) 20 MG tablet Take 20 mg by mouth daily. 03/14/18  Yes [provider]  Lysine 1000 MG TABS Take 1,000 mg by mouth daily.   Yes [provider]  METHYLCOBALAMIN PO Take 1 tablet by mouth daily.    Yes [provider]  Multiple Vitamins-Minerals (MULTIVITAL-M) TABS Take 1 tablet by mouth daily.    Yes [provider]  Omega-3 Fatty Acids (FISH OIL) 600 MG CAPS Take 600 mg by mouth 3 (three) times daily.    Yes [provider]  OVER THE COUNTER MEDICATION Take 50 mg by mouth 2 (two) times daily. P5P   Yes [provider]  OVER THE COUNTER MEDICATION Take 133 mg by mouth 3 (three) times daily. Triple Magnesium   Yes [provider]  OVER THE COUNTER MEDICATION Take 750 mg by mouth 2 (two) times daily. Olive Leaf Extract   Yes [provider]  potassium gluconate 595 MG TABS Take 595 mg by mouth 3 (three) times daily.   Yes [provider]  Blood Glucose Calibration (ACCU-CHEK SMARTVIEW CONTROL) LIQD Test up to TID dx:E11.40 09/13/14   Janith Lima, MD  Blood Glucose Monitoring Suppl (ACCU-CHEK NANO SMARTVIEW) W/DEVICE KIT Test up to TID dx:E11.40 09/13/14   Janith Lima, MD  glucose blood (ACCU-CHEK SMARTVIEW) test strip Test up to TID dx:E11.40 09/13/14   Janith Lima, MD  Lancets Misc. (ACCU-CHEK FASTCLIX LANCET) KIT Test up to TID dx:E11.40 09/13/14   Janith Lima, MD  OVER THE COUNTER MEDICATION Take 1 tablet by mouth daily. Beet root    [provider]    Family History Family History  Problem  Relation Age of Onset  . Heart disease Father   . Leukemia Father   . Alcohol abuse Son   . Other Mother   . Cancer Neg Hx   . Diabetes Neg Hx   . Hearing loss Neg Hx   . Hyperlipidemia Neg Hx   . Hypertension Neg Hx   . Kidney disease Neg Hx   . Stroke Neg Hx     Social History Social History   Tobacco Use  . Smoking status: Former Smoker    Packs/day: 1.25  Years: 16.00    Pack years: 20.00    Types: Cigarettes    Last attempt to quit: 08/28/1987    Years since quitting: 30.5  . Smokeless tobacco: Never Used  Substance Use Topics  . Alcohol use: Not Currently  . Drug use: Not Currently    Types: Marijuana     Allergies   Nickel   Review of Systems Review of Systems  All other systems reviewed and are negative.    Physical Exam Updated Vital Signs BP (!) 173/67 (BP Location: Right Arm)   Pulse (!) 103   Temp 98 F (36.7 C) (Oral)   Resp 18   Ht '5\' 2"'$  (1.575 m)   Wt 81.3 kg (179 lb 3.7 oz)   SpO2 100%   BMI 32.78 kg/m   Physical Exam  Constitutional: She appears well-developed and well-nourished. No distress.  HENT:  Head: Normocephalic and atraumatic.  Mouth/Throat: Oropharynx is clear and moist. No oropharyngeal exudate.  Eyes: Pupils are equal, round, and reactive to light. Conjunctivae and EOM are normal. Right eye exhibits no discharge. Left eye exhibits no discharge. No scleral icterus.  Neck: Normal range of motion. Neck supple. No JVD present. No thyromegaly present.  Cardiovascular: Normal rate, regular rhythm, normal heart sounds and intact distal pulses. Exam reveals no gallop and no friction rub.  No murmur heard. Pulmonary/Chest: Effort normal and breath sounds normal. No respiratory distress. She has no wheezes. She has no rales.  Abdominal: Soft. Bowel sounds are normal. She exhibits no distension and no mass. There is no tenderness.  Musculoskeletal: Normal range of motion. She exhibits no edema or tenderness.  Lymphadenopathy:     She has no cervical adenopathy.  Neurological: She is alert. Coordination normal.  Neurologic exam:  Speech clear, pupils equal round reactive to light, extraocular movements intact  Normal peripheral visual fields Cranial nerves III through XII normal including no facial droop Follows commands, moves all extremities x4, normal strength to bilateral upper and lower extremities at all major muscle groups including grip Sensation normal to light touch and pinprick Coordination intact, no limb ataxia, finger-nose-finger normal Rapid alternating movements normal No pronator drift Gait normal Sensation seems normal. - denies asymetry  Skin: Skin is warm and dry. No rash noted. No erythema.  Psychiatric: She has a normal mood and affect. Her behavior is normal.  Nursing note and vitals reviewed.    ED Treatments / Results  Labs (all labs ordered are listed, but only abnormal results are displayed) Labs Reviewed  COMPREHENSIVE METABOLIC PANEL - Abnormal; Notable for the following components:      Result Value   Glucose, Bld 151 (*)    Creatinine, Ser 1.01 (*)    GFR calc non Af Amer 58 (*)    All other components within normal limits  HEMOGLOBIN A1C - Abnormal; Notable for the following components:   Hgb A1c MFr Bld 5.8 (*)    All other components within normal limits  LIPID PANEL - Abnormal; Notable for the following components:   Cholesterol 214 (*)    LDL Cholesterol 130 (*)    All other components within normal limits  GLUCOSE, CAPILLARY - Abnormal; Notable for the following components:   Glucose-Capillary 109 (*)    All other components within normal limits  GLUCOSE, RANDOM - Abnormal; Notable for the following components:   Glucose, Bld 105 (*)    All other components within normal limits  GLUCOSE, CAPILLARY - Abnormal; Notable for the following components:  Glucose-Capillary 103 (*)    All other components within normal limits  GLUCOSE, CAPILLARY - Abnormal; Notable for  the following components:   Glucose-Capillary 164 (*)    All other components within normal limits  I-STAT CHEM 8, ED - Abnormal; Notable for the following components:   Glucose, Bld 143 (*)    All other components within normal limits  ETHANOL  PROTIME-INR  APTT  CBC  DIFFERENTIAL  HIV ANTIBODY (ROUTINE TESTING)  CBC  CREATININE, SERUM  TSH  GLUCOSE, CAPILLARY  CBC  BASIC METABOLIC PANEL  I-STAT TROPONIN, ED    EKG EKG Interpretation  Date/Time:  Friday March 21 2018 10:57:59 EDT Ventricular Rate:  61 PR Interval:    QRS Duration: 121 QT Interval:  437 QTC Calculation: 441 R Axis:   -45 Text Interpretation:  Sinus rhythm Nonspecific IVCD with LAD Left ventricular hypertrophy since last tracing no significant change Confirmed by Noemi Chapel [phone]) on 03/21/2018 11:56:59 AM   Radiology Ct Head Wo Contrast  Result Date: 03/21/2018 CLINICAL DATA:  62 year old female with nausea, vomiting and dizziness EXAM: CT HEAD WITHOUT CONTRAST TECHNIQUE: Contiguous axial images were obtained from the base of the skull through the vertex without intravenous contrast. COMPARISON:  None. FINDINGS: Brain: No evidence of acute infarction, hemorrhage, hydrocephalus, extra-axial collection or mass lesion/mass effect. Moderate cerebral cortical volume loss, advanced for age. Moderate periventricular white matter hypoattenuation most consistent with sequelae of chronic microvascular ischemic white matter disease. Vascular: No hyperdense vessel or unexpected calcification. Skull: Normal. Negative for fracture or focal lesion. Sinuses/Orbits: Small amount of frothy secretions present within the left sphenoid air cell. Otherwise, the paranasal sinuses are clear. Other: None. IMPRESSION: 1. No acute intracranial abnormality. 2. Age advanced cerebral cortical volume loss. 3. 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 Electronically Signed   By: Jacqulynn Cadet M.D.   On: 03/21/2018  11:34   Mr Jeri Cos And Wo Contrast  Result Date: 03/21/2018 CLINICAL DATA:  Numbness or tingling, paresthesias. Episode of right-sided weakness 6 days ago which resolved. EXAM: MRI HEAD WITHOUT AND WITH CONTRAST TECHNIQUE: Multiplanar, multiecho pulse sequences of the brain and surrounding structures were obtained without and with intravenous contrast. CONTRAST:  60m MULTIHANCE GADOBENATE DIMEGLUMINE 529 MG/ML IV SOLN COMPARISON:  CT head without contrast 03/21/2018 FINDINGS: Brain: 6 mm acute nonhemorrhagic infarct is present within the posterior limb of the left internal capsule and lateral thalamus. No acute hemorrhage or mass lesion is present. No other acute infarct is present. T2 signal changes are associated with the area of restricted diffusion. Dilated perivascular spaces are present within the basal ganglia. Confluent periventricular and subcortical T2 hyperintensities are noted bilaterally. These are moderately advanced for age. White matter changes extend into the brainstem. The brainstem and cerebellum are otherwise unremarkable. 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 Postcontrast images demonstrate a 9 mm focus enhancement along the inferior aspect of the left frontal operculum. This is along the cortex without a definite mass lesion. No other foci of enhancement are present. Vascular: Flow is present in the major intracranial arteries. Skull and upper cervical spine: Craniocervical junction is within normal limits. Upper cervical spine is normal. Sinuses/Orbits: Mild mucosal thickening is present inferiorly in the left maxillary sinus. The paranasal sinuses and mastoid air cells are otherwise clear. There is some fluid within the nasopharynx. Globes and orbits are within normal limits. IMPRESSION: 1. 6 mm acute nonhemorrhagic infarct along the posterior limb of the right internal capsule or lateral  thalamus correlates with right-sided weakness. 2. Focal subcortical  enhancement the left frontal operculum. This may represent a early chronic infarct. There is no definite mass lesion. Recommend follow-up MRI without and with contrast in 1-2 months. 3. Atrophy and white matter disease are advanced for age. This likely reflects the sequela of chronic microvascular ischemia. Electronically Signed   By: San Morelle M.D.   On: 03/21/2018 16:50    Procedures Procedures (including critical care time)  Medications Ordered in ED Medications  acetaminophen (TYLENOL) tablet 650 mg (has no administration in time range)    Or  acetaminophen (TYLENOL) solution 650 mg (has no administration in time range)    Or  acetaminophen (TYLENOL) suppository 650 mg (has no administration in time range)  senna-docusate (Senokot-S) tablet 1 tablet (has no administration in time range)  heparin injection 5,000 Units (5,000 Units Subcutaneous Given 03/22/18 2106)  atorvastatin (LIPITOR) tablet 80 mg (80 mg Oral Given 03/22/18 1813)  aspirin EC tablet 81 mg (81 mg Oral Given 03/22/18 0909)  amLODipine (NORVASC) tablet 5 mg (5 mg Oral Given 03/22/18 1423)  hydrALAZINE (APRESOLINE) injection 5 mg (has no administration in time range)  gadobenate dimeglumine (MULTIHANCE) injection 15 mL (15 mLs Intravenous Contrast Given 03/21/18 1623)   stroke: mapping our early stages of recovery book ( Does not apply Given 03/21/18 2054)  hydrALAZINE (APRESOLINE) injection 5 mg (5 mg Intravenous Given 03/22/18 0909)  hydrALAZINE (APRESOLINE) injection 10 mg (10 mg Intravenous Given 03/22/18 1045)  hydrALAZINE (APRESOLINE) injection 10 mg (10 mg Intravenous Given 03/22/18 1311)  zolpidem (AMBIEN) tablet 5 mg (5 mg Oral Given 03/22/18 2107)     Initial Impression / Assessment and Plan / ED Course  I have reviewed the triage vital signs and the nursing notes.  Pertinent labs & imaging results that were available during my care of the patient were reviewed by me and considered in my medical decision  making (see chart for details).  Clinical Course as of Mar 22 2128  Fri Mar 21, 2018  1107 I discussed the care with the neurologist Dr. Lorraine Lax who recommends that the patient needs to have CT scan and the MRI with and without contrast.  The patient's blood pressure is elevated at this time, she reports that it has been persistently elevated over time.  This is not a code stroke.   [BM]    Clinical Course User Index [BM] Noemi Chapel, MD    The patient has focal neurologic deficits that she complains of however on my exam there is no weakness, no difficulty with coordination or gait, she complains of sensory deficits but I do not feel any objective findings.  This could be ischemia, this could be related to hypertension, this could be related to multiple sclerosis or other demyelinating diseases.  Will discuss with neurology as well as get CT scan of the brain and labs.  At change of shift, the patient was signed out to the oncoming emergency provider, Dr. Lajean Saver to follow-up the MRI results and disposition accordingly.  Final Clinical Impressions(s) / ED Diagnoses   Final diagnoses:  Acute CVA (cerebrovascular accident) [facility])      Noemi Chapel, MD 03/22/18 2130

## 2018-03-21 NOTE — ED Triage Notes (Signed)
Patient arrived to ED via [facility] from home. EMS reports:    Patient's complaints of nausea (when standing), vomiting and diarrhea from this morning. Also, reports dizziness. Patient reports that last Saturday, she experienced R sided weakness that resolved, and has since felt generalized weakness.  Grips equal/strong. PERRLA.  BP 168/92, Pulse 58, 97% on room air. CBG 140.

## 2018-03-21 NOTE — ED Provider Notes (Signed)
Patient signed out to check MRI.  MRI shows small, 6 mm acute cva. Pt notes last normal last night before bed. Awoke w left numbness/dizziness.   Neurology consulted - discussed w Dr Laurence Slate, they will see in consult - indicate admit to medicine.  Unassigned medicine consulted for admission (current pcp is with [facility]).      Cathren Laine, MD 03/21/18 1739

## 2018-03-21 NOTE — Consult Note (Signed)
NEURO HOSPITALIST      Requesting Physician: Dr. Ashok Cordia    Chief Complaint: N/V/D, dizziness  History obtained from:  Patient    HPI:                                                                                                                                         Jade [name] is an 62 y.o. female PMH significant for HTN, DM who presents to [facility] with complaints of dizziness, N/V/D.  Patient states that last Saturday she experienced a sudden onset of right sided weakness that has not resolved, and she has been generally weak for about 1 week. Patient states that when she woke up this morning at about 9:00 she noticed that " she did not feel quite right". She felt dizzy and her numbness of the right side felt like it was spreading to the left side. " it feels like a novocain shot". She reports that she has a similar episode about November of 2018 when she was in [place]. At this time she had the same n/v, but no numbness. Her BP was elevated to the 220's at that time also. Denies diplopia, room spinning, CP, SOB, abnormal blurred vision. Patient has had blurred vision in left eye for about 1 month. She also endorses diabetic neuropathy. She states her feet are numb but she feels pain and this is why she does not drive.   ED course:  BP on arrival was 168/92, BG 140, creatinine 1.01 CT head: no intracranial abnormality MRI brain: 1. 6 mm acute nonhemorrhagic infarct along the posterior limb of the right internal capsule or lateral thalamus correlates with right-sided weakness.Focal subcortical enhancement the left frontal operculum. This may represent a early chronic infarct. There is no definite mass lesion. Recommend follow-up MRI without and with contrast in 1-2 months.Atrophy and white matter disease are advanced for age. This likely reflects the sequela of chronic microvascular ischemia.  No previous stroke history.     Date  last known well: Unable to determine Time last known well: Unable to determine tPA Given: No: contraindicated outside of window  Modified Rankin: Rankin Score=0  NIHSS:0 1a Level of Conscious:0 1b LOC Questions: 0 1c LOC Commands: 0 2 Best Gaze: 0 3 Visual: 0 4 Facial Palsy: 0 5a Motor Arm - left: 0 5b Motor Arm - Right: 0 6a Motor Leg - Left: 0 6b Motor Leg - Right:0  7 Limb Ataxia: 0 8 Sensory: 0 9 Best Language: 0 10 Dysarthria:0 11 Extinct. and Inattention:0 TOTAL: 0     Past Medical History:  Diagnosis Date  . Chronic kidney disease   .  COPD (chronic obstructive pulmonary disease) ([place])   . Diabetes mellitus   . Hyperlipidemia   . Hypertension   . Neuromuscular disorder ([place])   . Sleep apnea     Past Surgical History:  Procedure Laterality Date  . CHOLECYSTECTOMY    . HERNIA REPAIR    . TONSILLECTOMY      Family History  Problem Relation Age of Onset  . Heart disease Father   . Leukemia Father   . Alcohol abuse Son   . Other Mother   . Cancer Neg Hx   . Diabetes Neg Hx   . Hearing loss Neg Hx   . Hyperlipidemia Neg Hx   . Hypertension Neg Hx   . Kidney disease Neg Hx   . Stroke Neg Hx          Social History:  reports that she quit smoking about 30 years ago. She has never used smokeless tobacco. She reports that she does not drink alcohol or use drugs.  Allergies: No Known Allergies  Medications:                                                                                                                           No current facility-administered medications for this encounter.    Current Outpatient Medications  Medication Sig Dispense Refill  . Cholecalciferol (VITAMIN D-3) 5000 UNITS TABS Take 5,000 Units by mouth daily.     . cloNIDine (CATAPRES) 0.1 MG tablet Take 0.1 mg by mouth daily.    . Coenzyme Q10 (COQ-10) 100 MG CAPS Take 100 mg by mouth daily.    . furosemide (LASIX) 20 MG tablet Take 20 mg by mouth daily.    Marland Kitchen Lysine 1000  MG TABS Take 1,000 mg by mouth daily.    . METHYLCOBALAMIN PO Take 1 tablet by mouth daily.     . Multiple Vitamins-Minerals (MULTIVITAL-M) TABS Take 1 tablet by mouth daily.     . Omega-3 Fatty Acids (FISH OIL) 600 MG CAPS Take 600 mg by mouth 3 (three) times daily.     Marland Kitchen OVER THE COUNTER MEDICATION Take 50 mg by mouth 2 (two) times daily. P5P    . OVER THE COUNTER MEDICATION Take 133 mg by mouth 3 (three) times daily. Triple Magnesium    . OVER THE COUNTER MEDICATION Take 750 mg by mouth 2 (two) times daily. Olive Leaf Extract    . potassium gluconate 595 MG TABS Take 595 mg by mouth 3 (three) times daily.    . Blood Glucose Calibration (ACCU-CHEK SMARTVIEW CONTROL) LIQD Test up to TID dx:E11.40 3 each 3  . Blood Glucose Monitoring Suppl (ACCU-CHEK NANO SMARTVIEW) W/DEVICE KIT Test up to TID dx:E11.40 1 kit 3  . glucose blood (ACCU-CHEK SMARTVIEW) test strip Test up to TID dx:E11.40 300 each 3  . Lancets Misc. (ACCU-CHEK FASTCLIX LANCET) KIT Test up to TID dx:E11.40 1 kit 2  . OVER THE COUNTER MEDICATION Take 1 tablet by mouth  daily. Beet root        ROS:                                                                                                                                       History obtained from the patient  General ROS: negative for - chills, fatigue, fever, night sweats, weight gain or weight loss  Ophthalmic ROS: negative for - blurry vision, double vision, eye pain or loss of vision  Respiratory ROS: negative for - cough,  shortness of breath or wheezing Cardiovascular ROS: negative for - chest pain, dyspnea on exertion,  Gastrointestinal: Positive for N/V/D earlier today Musculoskeletal ROS: negative for - joint swelling or muscular weakness Neurological ROS: as noted in HPI   General Examination:                                                                                                      Blood pressure (!) 165/61, pulse (!) 46, temperature (!) 97.5 F (36.4  C), temperature source Oral, resp. rate 13, height '5\' 2"'$  (1.575 m), weight 82.1 kg (181 lb), SpO2 97 %.  HEENT-  Normocephalic, no lesions, without obvious abnormality.  Normal external eye and conjunctiva.  Cardiovascular-pulses palpable throughout   Lungs-no excessive working breathing.  Saturations within normal limits on RA Extremities- Warm, dry and intact Musculoskeletal-no joint tenderness, deformity or swelling Skin-warm and dry, intact, are of redness on left shin.  Neurological Examination Mental Status: Alert, oriented, person, age, year, month, situation.  thought content appropriate.  Speech fluent without evidence of aphasia.  Able to follow commands without difficulty. Cranial Nerves: II:  Visual fields grossly normal,  III,IV, VI: ptosis not present, extra-ocular motions intact bilaterally, pupils equal, round, reactive to light and accommodation V,VII: smile symmetric, facial light touch sensation normal bilaterally VIII: hearing normal bilaterally IX,X: uvula rises symmetrically XI: bilateral shoulder shrug XII: midline tongue extension Motor: Right : Upper extremity   5/5    Left:     Upper extremity   5/5  Lower extremity   5/5     Lower extremity   5/5 Tone and bulk:normal tone throughout; no atrophy noted Sensory: Pinprick and light touch intact throughout, bilaterally Deep Tendon Reflexes: 2+ and symmetric throughout Plantars: Right: downgoing   Left: downgoing Cerebellar: normal finger-to-nose, normal rapid alternating movements and normal heel-to-shin test Gait: deferred   Lab Results: Basic Metabolic Panel: Recent Labs  Lab 03/21/18 1134 03/21/18 1159  NA 143 143  K  4.7 4.5  CL 108 107  CO2 27  --   GLUCOSE 151* 143*  BUN 18 22  CREATININE 1.01* 0.80  CALCIUM 9.5  --     CBC: Recent Labs  Lab 03/21/18 1134 03/21/18 1159  WBC 7.4  --   NEUTROABS 5.4  --   HGB 13.9 13.3  HCT 42.4 39.0  MCV 95.9  --   PLT 229  --     Lipid  Panel: No results for input(s): CHOL, TRIG, HDL, CHOLHDL, VLDL, LDLCALC in the last 168 hours.  CBG: No results for input(s): GLUCAP in the last 168 hours.  Imaging: Ct Head Wo Contrast  Result Date: 03/21/2018 CLINICAL DATA:  62 year old female with nausea, vomiting and dizziness EXAM: CT HEAD WITHOUT CONTRAST TECHNIQUE: Contiguous axial images were obtained from the base of the skull through the vertex without intravenous contrast. COMPARISON:  None. FINDINGS: Brain: No evidence of acute infarction, hemorrhage, hydrocephalus, extra-axial collection or mass lesion/mass effect. Moderate cerebral cortical volume loss, advanced for age. Moderate periventricular white matter hypoattenuation most consistent with sequelae of chronic microvascular ischemic white matter disease. Vascular: No hyperdense vessel or unexpected calcification. Skull: Normal. Negative for fracture or focal lesion. Sinuses/Orbits: Small amount of frothy secretions present within the left sphenoid air cell. Otherwise, the paranasal sinuses are clear. Other: None. IMPRESSION: 1. No acute intracranial abnormality. 2. Age advanced cerebral cortical volume loss. 3. 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 Electronically Signed   By: Jacqulynn Cadet M.D.   On: 03/21/2018 11:34   Mr Jeri Cos And Wo Contrast  Result Date: 03/21/2018 CLINICAL DATA:  Numbness or tingling, paresthesias. Episode of right-sided weakness 6 days ago which resolved. EXAM: MRI HEAD WITHOUT AND WITH CONTRAST TECHNIQUE: Multiplanar, multiecho pulse sequences of the brain and surrounding structures were obtained without and with intravenous contrast. CONTRAST:  10m MULTIHANCE GADOBENATE DIMEGLUMINE 529 MG/ML IV SOLN COMPARISON:  CT head without contrast 03/21/2018 FINDINGS: Brain: 6 mm acute nonhemorrhagic infarct is present within the posterior limb of the left internal capsule and lateral thalamus. No acute hemorrhage or mass lesion is present.  No other acute infarct is present. T2 signal changes are associated with the area of restricted diffusion. Dilated perivascular spaces are present within the basal ganglia. Confluent periventricular and subcortical T2 hyperintensities are noted bilaterally. These are moderately advanced for age. White matter changes extend into the brainstem. The brainstem and cerebellum are otherwise unremarkable. 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 Postcontrast images demonstrate a 9 mm focus enhancement along the inferior aspect of the left frontal operculum. This is along the cortex without a definite mass lesion. No other foci of enhancement are present. Vascular: Flow is present in the major intracranial arteries. Skull and upper cervical spine: Craniocervical junction is within normal limits. Upper cervical spine is normal. Sinuses/Orbits: Mild mucosal thickening is present inferiorly in the left maxillary sinus. The paranasal sinuses and mastoid air cells are otherwise clear. There is some fluid within the nasopharynx. Globes and orbits are within normal limits. IMPRESSION: 1. 6 mm acute nonhemorrhagic infarct along the posterior limb of the right internal capsule or lateral thalamus correlates with right-sided weakness. 2. Focal subcortical enhancement the left frontal operculum. This may represent a early chronic infarct. There is no definite mass lesion. Recommend follow-up MRI without and with contrast in 1-2 months. 3. Atrophy and white matter disease are advanced for age. This likely reflects the sequela of chronic microvascular ischemia. Electronically Signed  By: San Morelle M.D.   On: 03/21/2018 16:50       Laurey Morale, MSN, NP-C [facility] [phone]  03/21/2018, 6:10 PM   NEUROHOSPITALIST ADDENDUM Seen and examined the patient today. I have reviewed the contents of history and physical exam as documented by PA/ARNP/Resident and agree with above  documentation.  I have discussed and formulated the above plan as documented. Edits to the note have been made as needed.     Assessment:  62 y.o. female PMH significant for HTN, DM who presents to [facility] with complaints of dizziness, N/V/D. Patient had a 1 week history of right side numbness. MRI shows left thalamic stroke.   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   You can look them up on [email or web address]  Password [facility]     Karena Addison Guss Farruggia MD [facility] [phone]   If 7pm to 7am, please call on call as listed on AMION.

## 2018-03-21 NOTE — ED Notes (Signed)
Phlebotomy at bedside at this time.

## 2018-03-21 NOTE — ED Notes (Signed)
Patient transported to MRI 

## 2018-03-22 ENCOUNTER — Inpatient Hospital Stay (HOSPITAL_COMMUNITY): Payer: Medicare HMO

## 2018-03-22 DIAGNOSIS — I10 Essential (primary) hypertension: Secondary | ICD-10-CM

## 2018-03-22 LAB — GLUCOSE, CAPILLARY
Glucose-Capillary: 109 mg/dL — ABNORMAL HIGH (ref 70–99)
Glucose-Capillary: 103 mg/dL — ABNORMAL HIGH (ref 70–99)
Glucose-Capillary: 164 mg/dL — ABNORMAL HIGH (ref 70–99)
Glucose-Capillary: 92 mg/dL (ref 70–99)

## 2018-03-22 LAB — LIPID PANEL
CHOLESTEROL: 214 mg/dL — AB (ref 0–200)
HDL: 64 mg/dL (ref >40)
LDL Cholesterol: 130 mg/dL — ABNORMAL HIGH (ref 0–99)
Total CHOL/HDL Ratio: 3.3 RATIO
Triglycerides: 102 mg/dL (ref <150)
VLDL: 20 mg/dL (ref 0–40)

## 2018-03-22 LAB — ECHOCARDIOGRAM COMPLETE
Height: 62 in
WEIGHTICAEL: 2867.74 [oz_av]

## 2018-03-22 LAB — HEMOGLOBIN A1C
Hgb A1c MFr Bld: 5.8 % — ABNORMAL HIGH (ref 4.8–5.6)
Mean Plasma Glucose: 119.76 mg/dL

## 2018-03-22 LAB — GLUCOSE, RANDOM: Glucose, Bld: 105 mg/dL — ABNORMAL HIGH (ref 70–99)

## 2018-03-22 LAB — HIV ANTIBODY (ROUTINE TESTING W REFLEX): HIV SCREEN 4TH GENERATION: NONREACTIVE

## 2018-03-22 MED ORDER — HYDRALAZINE HCL 20 MG/ML IJ SOLN
10.0000 mg | Freq: Once | INTRAMUSCULAR | Status: AC
  Administered 2018-03-22: 10 mg via INTRAVENOUS
  Filled 2018-03-22: qty 1

## 2018-03-22 MED ORDER — HYDRALAZINE HCL 20 MG/ML IJ SOLN
5.0000 mg | Freq: Once | INTRAMUSCULAR | Status: AC
  Administered 2018-03-22: 5 mg via INTRAVENOUS
  Filled 2018-03-22: qty 1

## 2018-03-22 MED ORDER — HYDRALAZINE HCL 20 MG/ML IJ SOLN
10.0000 mg | Freq: Once | INTRAMUSCULAR | Status: AC
  Administered 2018-03-22: 10 mg via INTRAVENOUS

## 2018-03-22 MED ORDER — AMLODIPINE BESYLATE 5 MG PO TABS
5.0000 mg | ORAL_TABLET | Freq: Every day | ORAL | Status: DC
  Administered 2018-03-22 – 2018-03-24 (×3): 5 mg via ORAL
  Filled 2018-03-22 (×3): qty 1

## 2018-03-22 MED ORDER — HYDRALAZINE HCL 20 MG/ML IJ SOLN
5.0000 mg | Freq: Four times a day (QID) | INTRAMUSCULAR | Status: DC | PRN
  Administered 2018-03-23 – 2018-03-24 (×3): 5 mg via INTRAVENOUS
  Filled 2018-03-22 (×3): qty 1

## 2018-03-22 MED ORDER — ZOLPIDEM TARTRATE 5 MG PO TABS
5.0000 mg | ORAL_TABLET | Freq: Once | ORAL | Status: AC
  Administered 2018-03-22: 5 mg via ORAL
  Filled 2018-03-22: qty 1

## 2018-03-22 NOTE — Progress Notes (Signed)
[facility] PROGRESS NOTE  NAYVIE LIPS ZOX:[id number] DOB: 15-Mar-1956 DOA: 03/21/2018  PCP: Frederich Chick., MD  Brief History/Interval Summary: 62 year old Caucasian female with a past medical history of his mellitus type II, morbid obesity, essential hypertension, COPD presented with complaints of numbness prominently involving the right side of her body.  She attributes this to taking losartan which was started recently by her primary care provider.  Evaluation was positive for stroke.  She was hospitalized for further management.  Reason for Visit: Acute stroke  Consultants: Neurology  Procedures:   Transthoracic echocardiogram is pending  Carotid Doppler was apparently done at her primary care physician's office on 7/22.  Will defer to neurology if this needs to be repeated  Antibiotics: None  Subjective/Interval History: Patient states that she feels well.  Still has numbness in the right side of her body.  Denies any weakness per se.  Denies any headaches.  Very reluctant to take statin.  She was educated about the same.  ROS: Denies any nausea or vomiting  Objective:  Vital Signs  Vitals:   03/21/18 2309 03/21/18 2337 03/22/18 0323 03/22/18 0828  BP: (!) 157/72 (!) 161/77 (!) 194/74 (!) 224/79  Pulse: (!) 58 74 65 72  Resp: 16 18  18   Temp: 98.1 F (36.7 C) 98 F (36.7 C) 98 F (36.7 C) 98 F (36.7 C)  TempSrc: Oral Oral Oral Oral  SpO2: 100% 98% 98% 100%  Weight:      Height:        Intake/Output Summary (Last 24 hours) at 03/22/2018 1222 Last data filed at 03/22/2018 0800 Gross per 24 hour  Intake 1624.5 ml  Output -  Net 1624.5 ml   Filed Weights   03/21/18 1102 03/21/18 2006  Weight: 82.1 kg (181 lb) 81.3 kg (179 lb 3.7 oz)    General appearance: alert, cooperative, appears stated age and no distress Head: Normocephalic, without obvious abnormality, atraumatic Resp: clear to auscultation bilaterally Cardio: regular rate and  rhythm, S1, S2 normal, no murmur, click, rub or gallop GI: soft, non-tender; bowel sounds normal; no masses,  no organomegaly Extremities: extremities normal, atraumatic, no cyanosis or edema Pulses: 2+ and symmetric Neurologic: Cranial nerves II to XII intact.  Motor strength appears to be equal bilateral upper and lower extremities.  Lab Results:  Data Reviewed: I have personally reviewed following labs and imaging studies  CBC: Recent Labs  Lab 03/21/18 1134 03/21/18 1159 03/21/18 2018  WBC 7.4  --  7.6  NEUTROABS 5.4  --   --   HGB 13.9 13.3 14.4  HCT 42.4 39.0 44.6  MCV 95.9  --  95.5  PLT 229  --  266    Basic Metabolic Panel: Recent Labs  Lab 03/21/18 1134 03/21/18 1159 03/21/18 2018 03/22/18 0430  NA 143 143  --   --   K 4.7 4.5  --   --   CL 108 107  --   --   CO2 27  --   --   --   GLUCOSE 151* 143*  --  105*  BUN 18 22  --   --   CREATININE 1.01* 0.80 0.91  --   CALCIUM 9.5  --   --   --     GFR: Estimated Creatinine Clearance: 62.5 mL/min (by C-G formula based on SCr of 0.91 mg/dL).  Liver Function Tests: Recent Labs  Lab 03/21/18 1134  AST 23  ALT 16  ALKPHOS 60  BILITOT 0.7  PROT 6.9  ALBUMIN 4.0    Coagulation Profile: Recent Labs  Lab 03/21/18 1134  INR 0.90     HbA1C: Recent Labs    03/22/18 0430  HGBA1C 5.8*    CBG: Recent Labs  Lab 03/21/18 2113 03/22/18 0331  GLUCAP 85 109*    Lipid Profile: Recent Labs    03/22/18 0430  CHOL 214*  HDL 64  LDLCALC 130*  TRIG 102  CHOLHDL 3.3    Thyroid Function Tests: Recent Labs    03/21/18 2018  TSH 1.755     Radiology Studies: Ct Head Wo Contrast  Result Date: 03/21/2018 CLINICAL DATA:  62 year old female with nausea, vomiting and dizziness EXAM: CT HEAD WITHOUT CONTRAST TECHNIQUE: Contiguous axial images were obtained from the base of the skull through the vertex without intravenous contrast. COMPARISON:  None. FINDINGS: Brain: No evidence of acute infarction,  hemorrhage, hydrocephalus, extra-axial collection or mass lesion/mass effect. Moderate cerebral cortical volume loss, advanced for age. Moderate periventricular white matter hypoattenuation most consistent with sequelae of chronic microvascular ischemic white matter disease. Vascular: No hyperdense vessel or unexpected calcification. Skull: Normal. Negative for fracture or focal lesion. Sinuses/Orbits: Small amount of frothy secretions present within the left sphenoid air cell. Otherwise, the paranasal sinuses are clear. Other: None. IMPRESSION: 1. No acute intracranial abnormality. 2. Age advanced cerebral cortical volume loss. 3. 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 Electronically Signed   By: Malachy Moan M.D.   On: 03/21/2018 11:34   Mr Laqueta Jean And Wo Contrast  Result Date: 03/21/2018 CLINICAL DATA:  Numbness or tingling, paresthesias. Episode of right-sided weakness 6 days ago which resolved. EXAM: MRI HEAD WITHOUT AND WITH CONTRAST TECHNIQUE: Multiplanar, multiecho pulse sequences of the brain and surrounding structures were obtained without and with intravenous contrast. CONTRAST:  60mL MULTIHANCE GADOBENATE DIMEGLUMINE 529 MG/ML IV SOLN COMPARISON:  CT head without contrast 03/21/2018 FINDINGS: Brain: 6 mm acute nonhemorrhagic infarct is present within the posterior limb of the left internal capsule and lateral thalamus. No acute hemorrhage or mass lesion is present. No other acute infarct is present. T2 signal changes are associated with the area of restricted diffusion. Dilated perivascular spaces are present within the basal ganglia. Confluent periventricular and subcortical T2 hyperintensities are noted bilaterally. These are moderately advanced for age. White matter changes extend into the brainstem. The brainstem and cerebellum are otherwise unremarkable. 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 Postcontrast images demonstrate a 9 mm focus  enhancement along the inferior aspect of the left frontal operculum. This is along the cortex without a definite mass lesion. No other foci of enhancement are present. Vascular: Flow is present in the major intracranial arteries. Skull and upper cervical spine: Craniocervical junction is within normal limits. Upper cervical spine is normal. Sinuses/Orbits: Mild mucosal thickening is present inferiorly in the left maxillary sinus. The paranasal sinuses and mastoid air cells are otherwise clear. There is some fluid within the nasopharynx. Globes and orbits are within normal limits. IMPRESSION: 1. 6 mm acute nonhemorrhagic infarct along the posterior limb of the right internal capsule or lateral thalamus correlates with right-sided weakness. 2. Focal subcortical enhancement the left frontal operculum. This may represent a early chronic infarct. There is no definite mass lesion. Recommend follow-up MRI without and with contrast in 1-2 months. 3. Atrophy and white matter disease are advanced for age. This likely reflects the sequela of chronic microvascular ischemia. Electronically Signed   By: Virl Son.D.  On: 03/21/2018 16:50     Medications:  Scheduled: . aspirin EC  81 mg Oral Daily  . atorvastatin  80 mg Oral q1800  . heparin  5,000 Units Subcutaneous Q8H  . hydrALAZINE  10 mg Intravenous Once   Continuous:  ZOX:[id number] **OR** acetaminophen (TYLENOL) oral liquid 160 mg/5 mL **OR** acetaminophen, senna-docusate  Assessment/Plan:    Acute CVA Echocardiogram is pending.  Patient apparently had recent carotid Doppler at her primary care provider's office.  Will defer to neurology to determine if this needs to be repeated.  Patient is currently on aspirin.  Statin initiated.  She was very reluctant to take this but she was counseled and educated.  LDL 130.  HbA1c 5.8.  PT and OT evaluation.  Diabetes mellitus type 2 Patient states that this is diet-controlled.  HbA1c  5.8.  Accelerated hypertension Blood pressure noted to be quite significantly elevated.  Some degree of permissive hypertension as allowed.  Defer to neurology.  At home patient is on clonidine and furosemide.  And was also recently started on losartan which she stopped because she attributed her symptoms to this medication.  There could be an element of rebound hypertension as well.  Obstructive sleep apnea Outpatient follow-up with primary care provider.  History of COPD Stable.  DVT Prophylaxis: Subcutaneous heparin    Code Status: Full code Family Communication: Discussed with the patient Disposition Plan: Management as outlined above.  Stroke work-up in progress.    LOS: 1 day   Osvaldo Shipper  [facility] Pager [phone] 03/22/2018, 12:22 PM  If 7PM-7AM, please contact night-coverage at [email or web address], password [facility]

## 2018-03-22 NOTE — Progress Notes (Signed)
SLP Cancellation Note  Patient Details Name: JACQUELIN KOLLMAR MRN: [id number] DOB: 02-17-1956   Cancelled treatment:       Reason Eval/Treat Not Completed: SLP screened, no needs identified, will sign off. Screened for swallow and and speech-language. Patient passed RN stroke swallow screen so BSE not needed.  Angela Nevin, MA, CCC-SLP 03/22/18 2:04 PM

## 2018-03-22 NOTE — Progress Notes (Signed)
VASCULAR LAB    Patient states she had Carotid duplex at [facility] 03/17/18.  Please advise if repeat duplex is needed.    Thank you,  Oza Oberle, RVT 03/22/2018, 11:48 AM

## 2018-03-22 NOTE — Progress Notes (Signed)
OT Cancellation Note  Patient Details Name: Jade [name] MRN: [id number] DOB: 01-26-1956   Cancelled Treatment:    Reason Eval/Treat Not Completed: Medical issues which prohibited therapy. Still Elevated BP, per nsg hold due to symptomatic status    Evette Georges [phone] 03/22/2018, 10:55 AM

## 2018-03-22 NOTE — Progress Notes (Signed)
STROKE TEAM PROGRESS NOTE   HISTORY OF PRESENT ILLNESS (per record) TAURA LAMARRE is an 62 y.o. female PMH significant for HTN and DM who presents to [facility] with complaints of dizziness, N/V/D.  Patient states that last Saturday she experienced a sudden onset of right sided weakness that has not resolved, and she has been generally weak for about 1 week. Patient states that when she woke up this morning at about 9:00 she noticed that " she did not feel quite right". She felt dizzy and her numbness of the right side felt like it was spreading to the left side. " it feels like a novocain shot". She reports that she has a similar episode about November of 2018 when she was in [place]. At this time she had the same n/v, but no numbness. Her BP was elevated to the 220's at that time also. Denies diplopia, room spinning, CP, SOB, abnormal blurred vision. Patient has had blurred vision in left eye for about 1 month. She also endorses diabetic neuropathy. She states her feet are numb but she feels pain and this is why she does not drive.   ED course:  BP on arrival was 168/92, BG 140, creatinine 1.01 CT head: no intracranial abnormality MRI brain: 1. 6 mm acute nonhemorrhagic infarct along the posterior limb of the right internal capsule or lateral thalamus correlates with right-sided weakness.Focal subcortical enhancement the left frontal operculum. This may represent a early chronic infarct. There is no definite mass lesion. Recommend follow-up MRI without and with contrast in 1-2 months.Atrophy and white matter disease are advanced for age. This likely reflects the sequela of chronic microvascular ischemia.  No previous stroke history.     Date last known well: Unable to determine Time last known well: Unable to determine tPA Given: No: contraindicated outside of window     SUBJECTIVE (INTERVAL HISTORY) Complains of being interrupted every time she tries to sleep.  Still feels numbness  in the right face, arm, and leg.  No weakness.  BP continues to be high.  I started Norvasc recently. She is on statin.  Her DM is well controlled with diet.  CDUS is pending.  TTE is normal.  MRI showed acute left thalamic infarct with global atrophy and periventricular small vessel ischemic disease.      OBJECTIVE Temp:  [97.7 F (36.5 C)-98.1 F (36.7 C)] 98 F (36.7 C) (07/27 0828) Pulse Rate:  [41-118] 118 (07/27 1242) Cardiac Rhythm: Normal sinus rhythm (07/27 0800) Resp:  [10-20] 20 (07/27 1242) BP: (147-224)/(61-85) 210/85 (07/27 1242) SpO2:  [97 %-100 %] 100 % (07/27 1242) Weight:  [179 lb 3.7 oz (81.3 kg)] 179 lb 3.7 oz (81.3 kg) (07/26 2006)  CBC:  Recent Labs  Lab 03/21/18 1134 03/21/18 1159 03/21/18 2018  WBC 7.4  --  7.6  NEUTROABS 5.4  --   --   HGB 13.9 13.3 14.4  HCT 42.4 39.0 44.6  MCV 95.9  --  95.5  PLT 229  --  266    Basic Metabolic Panel:  Recent Labs  Lab 03/21/18 1134 03/21/18 1159 03/21/18 2018 03/22/18 0430  NA 143 143  --   --   K 4.7 4.5  --   --   CL 108 107  --   --   CO2 27  --   --   --   GLUCOSE 151* 143*  --  105*  BUN 18 22  --   --   CREATININE 1.01* 0.80  0.91  --   CALCIUM 9.5  --   --   --     Lipid Panel:     Component Value Date/Time   CHOL 214 (H) 03/22/2018 0430   TRIG 102 03/22/2018 0430   HDL 64 03/22/2018 0430   CHOLHDL 3.3 03/22/2018 0430   VLDL 20 03/22/2018 0430   LDLCALC 130 (H) 03/22/2018 0430   HgbA1c:  Lab Results  Component Value Date   HGBA1C 5.8 (H) 03/22/2018   Urine Drug Screen: No results found for: LABOPIA, COCAINSCRNUR, LABBENZ, AMPHETMU, THCU, LABBARB  Alcohol Level     Component Value Date/Time   ETH <10 03/21/2018 1134    IMAGING   Ct Head Wo Contrast 03/21/2018 IMPRESSION:  1. No acute intracranial abnormality.  2. Age advanced cerebral cortical volume loss.  3. 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     Mr Laqueta Jean And Wo  Contrast 03/21/2018 IMPRESSION:  1. 6 mm acute nonhemorrhagic infarct along the posterior limb of the right internal capsule or lateral thalamus correlates with right-sided weakness.  2. Focal subcortical enhancement the left frontal operculum. This may represent a early chronic infarct. There is no definite mass lesion. Recommend follow-up MRI without and with contrast in 1-2 months.  3. Atrophy and white matter disease are advanced for age. This likely reflects the sequela of chronic microvascular ischemia.     Transthoracic Echocardiogram  03/22/2018 Study Conclusions  - Left ventricle: The cavity size was normal. Systolic function was   normal. The estimated ejection fraction was in the range of 60%   to 65%. Wall motion was normal; there were no regional wall   motion abnormalities. Left ventricular diastolic function   parameters were normal.    Bilateral Carotid Dopplers - pending 00/00/00     PHYSICAL EXAM Vitals:   03/21/18 2337 03/22/18 0323 03/22/18 0828 03/22/18 1242  BP: (!) 161/77 (!) 194/74 (!) 224/79 (!) 210/85  Pulse: 74 65 72 (!) 118  Resp: 18  18 20   Temp: 98 F (36.7 C) 98 F (36.7 C) 98 F (36.7 C)   TempSrc: Oral Oral Oral   SpO2: 98% 98% 100% 100%  Weight:      Height:        Awake, alert, fully oriented.  Language- fluent.  Comprehension, naming, repetition - intact. Face symmetric.  Tongue midline. Strength 5/5 BUE and BLE. Coord- FTN, HTS intact bilaterally. Sensory - decreased light touch and pinprick in the right face, arm, and leg.        ASSESSMENT/PLAN Ms. AIREANA RYLAND is a 62 y.o. female with history of COPD, Hld, DM, OSA, Htn, strokes, CKD, and neuropathy presenting with dizziness.  She did not receive IV t-PA due to late presentation.   Stroke:  infarct along the posterior limb of the right internal capsule or lateral thalamus  - small vessel  Resultant  Right hemianesthesia  CT head - No acute intracranial  abnormality.   MRI head - 6 mm acute nonhemorrhagic infarct along the posterior limb of the right internal capsule or lateral thalamus  MRA head - not performed  Carotid Doppler - pending  2D Echo - EF 60 to 65%. No cardiac source of emboli identified.  LDL - 130  HgbA1c - 5.8  VTE prophylaxis - [place] Heprin Diet Order           Diet heart healthy/carb modified Room service appropriate? Yes; Fluid consistency: Thin  Diet effective now  No antithrombotic prior to admission, now on aspirin 81 mg daily  Patient counseled to be compliant with her antithrombotic medications  Ongoing aggressive stroke risk factor management  Therapy recommendations:  pending  Disposition:  Pending  Hypertension  BP high at times - symptoms x 1 week - will treat elevated SBP gradually . Permissive hypertension (OK if < 220/120) but gradually normalize in 5-7 days . Long-term BP goal normotensive  Hyperlipidemia  Lipid lowering medication PTA:  none  LDL 130 goal < 70  Current lipid lowering medication: Lipitor 80 mg daily  Continue statin at discharge  Diabetes  HgbA1c 5.8, goal < 7.0  Controlled  Other Stroke Risk Factors  Advanced age  Former cigarette smoker - quit  Former ETOH use  Obesity, Body mass index is 32.78 kg/m., recommend weight loss, diet and exercise as appropriate   Hx stroke/TIA  Migraines  Obstructive sleep apnea, not on CPAP at home (doesn't tolerate)  Other Active Problems   Focal subcortical enhancement the left frontal operculum. This may represent a early chronic infarct. There is no definite mass lesion. Recommend follow-up MRI without and with contrast in 1-2 months.   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  She is now on ASA monotherapy.  I will await the effects of Norvasc, but may have to increase the dose if the  Hydralazine continues to be needed.  She is on statin.  Her DM is well controlled.  Non-smoker.   Weston Settle, MS, MD  To contact Stroke Continuity provider, please refer to [email or web address]. After hours, contact General Neurology

## 2018-03-22 NOTE — Progress Notes (Signed)
OT Cancellation Note  Patient Details Name: Jade [name] MRN: [id number] DOB: March 09, 1956   Cancelled Treatment:    Reason Eval/Treat Not Completed: Other (comment). Pt BP 224/79, RN gave meds about 9:00 and now 198/89. Will wait a little longer and check BP before proceeding with eval as appropriate.  Evette Georges [phone] 03/22/2018, 9:37 AM

## 2018-03-22 NOTE — Progress Notes (Signed)
PT Cancellation Note  Patient Details Name: Jade [name] MRN: [id number] DOB: 1956/01/12   Cancelled Treatment:    Reason Eval/Treat Not Completed: Medical issues which prohibited therapy;Patient not medically ready(elevated BP, per nsg hold due to symptomatic status)   Fabio Asa 03/22/2018, 10:53 AM  Charlotte Crumb, PT DPT  Board Certified Neurologic Specialist [phone]

## 2018-03-22 NOTE — Progress Notes (Signed)
  Echocardiogram 2D Echocardiogram has been performed.  Tye Savoy 03/22/2018, 12:02 PM

## 2018-03-23 LAB — CBC
HEMATOCRIT: 42.6 % (ref 36.0–46.0)
Hemoglobin: 13.9 g/dL (ref 12.0–15.0)
MCH: 31.1 pg (ref 26.0–34.0)
MCHC: 32.6 g/dL (ref 30.0–36.0)
MCV: 95.3 fL (ref 78.0–100.0)
Platelets: 283 10*3/uL (ref 150–400)
RBC: 4.47 MIL/uL (ref 3.87–5.11)
RDW: 13.2 % (ref 11.5–15.5)
WBC: 8.8 10*3/uL (ref 4.0–10.5)

## 2018-03-23 LAB — BASIC METABOLIC PANEL
Anion gap: 9 (ref 5–15)
BUN: 8 mg/dL (ref 8–23)
CALCIUM: 9.6 mg/dL (ref 8.9–10.3)
CO2: 23 mmol/L (ref 22–32)
Chloride: 110 mmol/L (ref 98–111)
Creatinine, Ser: 0.86 mg/dL (ref 0.44–1.00)
GFR calc non Af Amer: >60 mL/min (ref >60)
Glucose, Bld: 113 mg/dL — ABNORMAL HIGH (ref 70–99)
Potassium: 3.3 mmol/L — ABNORMAL LOW (ref 3.5–5.1)
Sodium: 142 mmol/L (ref 135–145)

## 2018-03-23 LAB — GLUCOSE, CAPILLARY: Glucose-Capillary: 98 mg/dL (ref 70–99)

## 2018-03-23 MED ORDER — CLONIDINE HCL 0.1 MG PO TABS
0.1000 mg | ORAL_TABLET | Freq: Every day | ORAL | Status: DC
  Administered 2018-03-23 – 2018-03-24 (×2): 0.1 mg via ORAL
  Filled 2018-03-23 (×2): qty 1

## 2018-03-23 MED ORDER — POTASSIUM CHLORIDE CRYS ER 20 MEQ PO TBCR
40.0000 meq | EXTENDED_RELEASE_TABLET | Freq: Once | ORAL | Status: AC
  Administered 2018-03-23: 40 meq via ORAL
  Filled 2018-03-23: qty 2

## 2018-03-23 NOTE — Progress Notes (Signed)
STROKE TEAM PROGRESS NOTE   HISTORY OF PRESENT ILLNESS (per record) Jade [name] is an 62 y.o. female PMH significant for HTN and DM who presents to [facility] with complaints of dizziness, N/V/D.  Patient states that last Saturday she experienced a sudden onset of right sided weakness that has not resolved, and she has been generally weak for about 1 week. Patient states that when she woke up this morning at about 9:00 she noticed that " she did not feel quite right". She felt dizzy and her numbness of the right side felt like it was spreading to the left side. " it feels like a novocain shot". She reports that she has a similar episode about November of 2018 when she was in [place]. At this time she had the same n/v, but no numbness. Her BP was elevated to the 220's at that time also. Denies diplopia, room spinning, CP, SOB, abnormal blurred vision. Patient has had blurred vision in left eye for about 1 month. She also endorses diabetic neuropathy. She states her feet are numb but she feels pain and this is why she does not drive.   ED course:  BP on arrival was 168/92, BG 140, creatinine 1.01 CT head: no intracranial abnormality MRI brain: 1. 6 mm acute nonhemorrhagic infarct along the posterior limb of the right internal capsule or lateral thalamus correlates with right-sided weakness.Focal subcortical enhancement the left frontal operculum. This may represent a early chronic infarct. There is no definite mass lesion. Recommend follow-up MRI without and with contrast in 1-2 months.Atrophy and white matter disease are advanced for age. This likely reflects the sequela of chronic microvascular ischemia.  No previous stroke history.     Date last known well: Unable to determine Time last known well: Unable to determine tPA Given: No: contraindicated outside of window     SUBJECTIVE (INTERVAL HISTORY) She is much more rested today.   Still feels numbness in the right face, arm, and  leg.  No weakness.  BP continued to be high all day until she was given the Clonidine and now is in the 140's systolic.  She continues on  Norvasc. She is on statin.  Her DM is well controlled with diet.  CDUS was done at outside facility on 7/22.  I reviewed the report and it was normal.   TTE is normal.  MRI showed acute left thalamic infarct with global atrophy and periventricular small vessel ischemic disease.      OBJECTIVE Temp:  [97.7 F (36.5 C)-98.5 F (36.9 C)] 97.7 F (36.5 C) (07/28 1221) Pulse Rate:  [93-114] 107 (07/28 1406) Cardiac Rhythm: Sinus tachycardia (07/28 1400) Resp:  [18-20] 20 (07/28 1221) BP: (158-204)/(67-91) 194/75 (07/28 1406) SpO2:  [97 %-100 %] 99 % (07/28 1406)  CBC:  Recent Labs  Lab 03/21/18 1134  03/21/18 2018 03/23/18 0622  WBC 7.4  --  7.6 8.8  NEUTROABS 5.4  --   --   --   HGB 13.9   < > 14.4 13.9  HCT 42.4   < > 44.6 42.6  MCV 95.9  --  95.5 95.3  PLT 229  --  266 283   < > = values in this interval not displayed.    Basic Metabolic Panel:  Recent Labs  Lab 03/21/18 1134 03/21/18 1159 03/21/18 2018 03/22/18 0430 03/23/18 0622  NA 143 143  --   --  142  K 4.7 4.5  --   --  3.3*  CL 108  107  --   --  110  CO2 27  --   --   --  23  GLUCOSE 151* 143*  --  105* 113*  BUN 18 22  --   --  8  CREATININE 1.01* 0.80 0.91  --  0.86  CALCIUM 9.5  --   --   --  9.6    Lipid Panel:     Component Value Date/Time   CHOL 214 (H) 03/22/2018 0430   TRIG 102 03/22/2018 0430   HDL 64 03/22/2018 0430   CHOLHDL 3.3 03/22/2018 0430   VLDL 20 03/22/2018 0430   LDLCALC 130 (H) 03/22/2018 0430   HgbA1c:  Lab Results  Component Value Date   HGBA1C 5.8 (H) 03/22/2018   Urine Drug Screen: No results found for: LABOPIA, COCAINSCRNUR, LABBENZ, AMPHETMU, THCU, LABBARB  Alcohol Level     Component Value Date/Time   ETH <10 03/21/2018 1134    IMAGING   Ct Head Wo Contrast 03/21/2018 IMPRESSION:  1. No acute intracranial abnormality.  2.  Age advanced cerebral cortical volume loss.  3. 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     Mr Jade [name] And Wo Contrast 03/21/2018 IMPRESSION:  1. 6 mm acute nonhemorrhagic infarct along the posterior limb of the right internal capsule or lateral thalamus correlates with right-sided weakness.  2. Focal subcortical enhancement the left frontal operculum. This may represent a early chronic infarct. There is no definite mass lesion. Recommend follow-up MRI without and with contrast in 1-2 months.  3. Atrophy and white matter disease are advanced for age. This likely reflects the sequela of chronic microvascular ischemia.     Transthoracic Echocardiogram  03/22/2018 Study Conclusions  - Left ventricle: The cavity size was normal. Systolic function was   normal. The estimated ejection fraction was in the range of 60%   to 65%. Wall motion was normal; there were no regional wall   motion abnormalities. Left ventricular diastolic function   parameters were normal.    Bilateral Carotid Dopplers - Pt had carotid dopplers at [facility] 03/17/18 - no need to repeat per Dr Laurence Slate.        PHYSICAL EXAM Vitals:   03/23/18 0500 03/23/18 0829 03/23/18 1221 03/23/18 1406  BP: (!) 195/91 (!) 158/83 (!) 204/81 (!) 194/75  Pulse: 96 (!) 105 (!) 110 (!) 107  Resp: 20 18 20    Temp: 98.5 F (36.9 C) 97.8 F (36.6 C) 97.7 F (36.5 C)   TempSrc: Oral Oral Oral   SpO2: 98% 99% 100% 99%  Weight:      Height:        Awake, alert, fully oriented.  Language- fluent.  Comprehension, naming, repetition - intact. Face symmetric.  Tongue midline. Strength 5/5 BUE and BLE. Coord- FTN, HTS intact bilaterally. Sensory - decreased light touch and pinprick in the right face, arm, and leg.        ASSESSMENT/PLAN [name] is a 62 y.o. female with history of COPD, Hld, DM, OSA, Htn, strokes, CKD, and neuropathy presenting with dizziness.  She did not  receive IV t-PA due to late presentation.   Stroke:  infarct along the posterior limb of the right internal capsule or lateral thalamus  - small vessel  Resultant  Right hemianesthesia  CT head - No acute intracranial abnormality.   MRI head - 6 mm acute nonhemorrhagic infarct along the posterior limb of the right internal capsule or lateral thalamus  MRA head -  not performed  Carotid Doppler -  Pt had carotid doppler at [facility] 03/17/18. No need to repeat per Dr Laurence Slate.  2D Echo - EF 60 to 65%. No cardiac source of emboli identified.  LDL - 130  HgbA1c - 5.8  VTE prophylaxis - [place] Heprin Diet Order           Diet heart healthy/carb modified Room service appropriate? Yes; Fluid consistency: Thin  Diet effective now          No antithrombotic prior to admission, now on aspirin 81 mg daily  Patient counseled to be compliant with her antithrombotic medications  Ongoing aggressive stroke risk factor management  Therapy recommendations:  Pending (therapists waiting for BP control)  Disposition:  Pending  Hypertension  BP high at times - symptoms x 1 week - will treat elevated SBP gradually . Permissive hypertension (OK if < 220/120) but gradually normalize in 5-7 days . Long-term BP goal normotensive  Hyperlipidemia  Lipid lowering medication PTA:  none  LDL 130 goal < 70  Current lipid lowering medication: Lipitor 80 mg daily  Continue statin at discharge  Diabetes  HgbA1c 5.8, goal < 7.0  Controlled  Other Stroke Risk Factors  Advanced age  Former cigarette smoker - quit  Former ETOH use  Obesity, Body mass index is 32.78 kg/m., recommend weight loss, diet and exercise as appropriate   Hx stroke/TIA  Migraines  Obstructive sleep apnea, not on CPAP at home (doesn't tolerate)  Other Active Problems   Focal subcortical enhancement the left frontal operculum. This may represent a early chronic infarct. There is no definite mass  lesion. Recommend follow-up MRI without and with contrast in 1-2 months.   BP high at times - symptoms x 1 week - treat elevated SBP gradually - Catapres restarted per Dr Rito Ehrlich.     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  She is now on ASA monotherapy.  BP is better controlled on combination of Norvasc and Clonidine.  She is on statin.  Her DM is well controlled.  Non-smoker.  She does not feel that she needs any outpatient physical or occupational therapy.  She may have some improvement of the numbness over the next 3 months, but cannot predict to what degree.   Will sign off and she can follow up in stroke clinic.    Weston Settle, MS, MD    To contact Stroke Continuity provider, please refer to [email or web address]. After hours, contact General Neurology

## 2018-03-23 NOTE — Progress Notes (Signed)
PT Cancellation Note  Patient Details Name: Jade [name] MRN: [id number] DOB: [birth year]   Cancelled Treatment:    Reason Eval/Treat Not Completed: Medical issues which prohibited therapy (BP: 220/94, and 223/73 - RN and MD notified)      Fabio Asa 03/23/2018, 10:27 AM

## 2018-03-23 NOTE — Progress Notes (Addendum)
[facility] PROGRESS NOTE  Jade [name] TIW:[id number] DOB: 17-Mar-1956 DOA: 03/21/2018  PCP: Frederich Chick., MD  Brief History/Interval Summary: 62 year old Caucasian female with a past medical history of his mellitus type II, morbid obesity, essential hypertension, COPD presented with complaints of numbness prominently involving the right side of her body.  She attributes this to taking losartan which was started recently by her primary care provider.  Evaluation was positive for stroke.  She was hospitalized for further management.  Reason for Visit: Acute stroke  Consultants: Neurology  Procedures:   Transthoracic echocardiogram Study Conclusions  - Left ventricle: The cavity size was normal. Systolic function was   normal. The estimated ejection fraction was in the range of 60%   to 65%. Wall motion was normal; there were no regional wall   motion abnormalities. Left ventricular diastolic function   parameters were normal.   Carotid Doppler was apparently done at her primary care physician's office on 7/22.  Will defer to neurology if this needs to be repeated  Antibiotics: None  Subjective/Interval History: Patient continues to have numbness in the right without any worsening.  Denies any weakness.  Denies any headaches.    ROS: Denies any nausea or vomiting  Objective:  Vital Signs  Vitals:   03/22/18 1946 03/23/18 0100 03/23/18 0500 03/23/18 0829  BP: (!) 173/67 (!) 175/84 (!) 195/91 (!) 158/83  Pulse: (!) 103 93 96 (!) 105  Resp:   20 18  Temp: 98 F (36.7 C) 97.9 F (36.6 C) 98.5 F (36.9 C) 97.8 F (36.6 C)  TempSrc: Oral Oral Oral Oral  SpO2: 100% 97% 98% 99%  Weight:      Height:        Intake/Output Summary (Last 24 hours) at 03/23/2018 0944 Last data filed at 03/23/2018 0530 Gross per 24 hour  Intake 240 ml  Output -  Net 240 ml   Filed Weights   03/21/18 1102 03/21/18 2006  Weight: 82.1 kg (181 lb) 81.3 kg (179 lb 3.7  oz)    General appearance: Awake alert.  In no distress Resp: Clear to auscultation bilaterally.  No wheezing rales or rhonchi Cardio: S1-S2 is normal regular.  No S3-S4.  No rubs murmurs or bruit GI: Abdomen is soft.  Nontender nondistended Neurologic: Cranial nerves II to XII intact.  Motor strength appears to be equal bilateral upper and lower extremities.  Lab Results:  Data Reviewed: I have personally reviewed following labs and imaging studies  CBC: Recent Labs  Lab 03/21/18 1134 03/21/18 1159 03/21/18 2018 03/23/18 0622  WBC 7.4  --  7.6 8.8  NEUTROABS 5.4  --   --   --   HGB 13.9 13.3 14.4 13.9  HCT 42.4 39.0 44.6 42.6  MCV 95.9  --  95.5 95.3  PLT 229  --  266 283    Basic Metabolic Panel: Recent Labs  Lab 03/21/18 1134 03/21/18 1159 03/21/18 2018 03/22/18 0430 03/23/18 0622  NA 143 143  --   --  142  K 4.7 4.5  --   --  3.3*  CL 108 107  --   --  110  CO2 27  --   --   --  23  GLUCOSE 151* 143*  --  105* 113*  BUN 18 22  --   --  8  CREATININE 1.01* 0.80 0.91  --  0.86  CALCIUM 9.5  --   --   --  9.6  GFR: Estimated Creatinine Clearance: 66.2 mL/min (by C-G formula based on SCr of 0.86 mg/dL).  Liver Function Tests: Recent Labs  Lab 03/21/18 1134  AST 23  ALT 16  ALKPHOS 60  BILITOT 0.7  PROT 6.9  ALBUMIN 4.0    Coagulation Profile: Recent Labs  Lab 03/21/18 1134  INR 0.90     HbA1C: Recent Labs    03/22/18 0430  HGBA1C 5.8*    CBG: Recent Labs  Lab 03/22/18 0331 03/22/18 1235 03/22/18 1654 03/22/18 2150 03/23/18 0624  GLUCAP 109* 103* 164* 92 98    Lipid Profile: Recent Labs    03/22/18 0430  CHOL 214*  HDL 64  LDLCALC 130*  TRIG 102  CHOLHDL 3.3    Thyroid Function Tests: Recent Labs    03/21/18 2018  TSH 1.755     Radiology Studies: Ct Head Wo Contrast  Result Date: 03/21/2018 CLINICAL DATA:  62 year old female with nausea, vomiting and dizziness EXAM: CT HEAD WITHOUT CONTRAST TECHNIQUE:  Contiguous axial images were obtained from the base of the skull through the vertex without intravenous contrast. COMPARISON:  None. FINDINGS: Brain: No evidence of acute infarction, hemorrhage, hydrocephalus, extra-axial collection or mass lesion/mass effect. Moderate cerebral cortical volume loss, advanced for age. Moderate periventricular white matter hypoattenuation most consistent with sequelae of chronic microvascular ischemic white matter disease. Vascular: No hyperdense vessel or unexpected calcification. Skull: Normal. Negative for fracture or focal lesion. Sinuses/Orbits: Small amount of frothy secretions present within the left sphenoid air cell. Otherwise, the paranasal sinuses are clear. Other: None. IMPRESSION: 1. No acute intracranial abnormality. 2. Age advanced cerebral cortical volume loss. 3. 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 Electronically Signed   By: Malachy Moan M.D.   On: 03/21/2018 11:34   Mr Laqueta Jean And Wo Contrast  Result Date: 03/21/2018 CLINICAL DATA:  Numbness or tingling, paresthesias. Episode of right-sided weakness 6 days ago which resolved. EXAM: MRI HEAD WITHOUT AND WITH CONTRAST TECHNIQUE: Multiplanar, multiecho pulse sequences of the brain and surrounding structures were obtained without and with intravenous contrast. CONTRAST:  15mL MULTIHANCE GADOBENATE DIMEGLUMINE 529 MG/ML IV SOLN COMPARISON:  CT head without contrast 03/21/2018 FINDINGS: Brain: 6 mm acute nonhemorrhagic infarct is present within the posterior limb of the left internal capsule and lateral thalamus. No acute hemorrhage or mass lesion is present. No other acute infarct is present. T2 signal changes are associated with the area of restricted diffusion. Dilated perivascular spaces are present within the basal ganglia. Confluent periventricular and subcortical T2 hyperintensities are noted bilaterally. These are moderately advanced for age. White matter changes extend into the  brainstem. The brainstem and cerebellum are otherwise unremarkable. 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 Postcontrast images demonstrate a 9 mm focus enhancement along the inferior aspect of the left frontal operculum. This is along the cortex without a definite mass lesion. No other foci of enhancement are present. Vascular: Flow is present in the major intracranial arteries. Skull and upper cervical spine: Craniocervical junction is within normal limits. Upper cervical spine is normal. Sinuses/Orbits: Mild mucosal thickening is present inferiorly in the left maxillary sinus. The paranasal sinuses and mastoid air cells are otherwise clear. There is some fluid within the nasopharynx. Globes and orbits are within normal limits. IMPRESSION: 1. 6 mm acute nonhemorrhagic infarct along the posterior limb of the right internal capsule or lateral thalamus correlates with right-sided weakness. 2. Focal subcortical enhancement the left frontal operculum. This may represent a early chronic infarct. There  is no definite mass lesion. Recommend follow-up MRI without and with contrast in 1-2 months. 3. Atrophy and white matter disease are advanced for age. This likely reflects the sequela of chronic microvascular ischemia. Electronically Signed   By: Marin Roberts M.D.   On: 03/21/2018 16:50     Medications:  Scheduled: . amLODipine  5 mg Oral Daily  . aspirin EC  81 mg Oral Daily  . atorvastatin  80 mg Oral q1800  . heparin  5,000 Units Subcutaneous Q8H   Continuous:  ZOX:[id number] **OR** acetaminophen (TYLENOL) oral liquid 160 mg/5 mL **OR** acetaminophen, hydrALAZINE, senna-docusate  Assessment/Plan:    Acute CVA Echocardiogram is pending.  Patient apparently had recent carotid Doppler at her primary care provider's office.  Will defer to neurology to determine if this needs to be repeated.  Patient is currently on aspirin.  Statin initiated.  She was very reluctant to  take this but she was counseled and educated.  LDL 130.  HbA1c 5.8.  PT and OT evaluation.  Accelerated hypertension Blood pressure noted to be quite significantly elevated.  Some degree of permissive hypertension was allowed.  She was started on amlodipine by neurology yesterday.  At home patient is on clonidine and furosemide. And was also recently started on losartan which she stopped because she attributed her symptoms to this medication.  There could be an element of rebound hypertension as well.  It appears that her hypertension was very recently diagnosed may be 2 to 3 weeks ago.  She states that she has not been seeing any doctors for a while.  It is quite possible patient had undiagnosed hypertension for long time.  Blood pressure again noted to be elevated this morning.  There might be benefit to resuming her clonidine in case this is rebound hypertension.  Will wait for neurology input as well.  Diabetes mellitus type 2 Patient states that this is diet-controlled.  HbA1c 5.8.  Obstructive sleep apnea Outpatient follow-up with primary care provider.  History of COPD Stable.  Hypokalemia Will be repleted  DVT Prophylaxis: Subcutaneous heparin    Code Status: Full code Family Communication: Discussed with the patient Disposition Plan: Wait for blood pressure to be better controlled.  PT evaluation is still pending.    LOS: 2 days   Osvaldo Shipper  [facility] Pager [phone] 03/23/2018, 9:44 AM  If 7PM-7AM, please contact night-coverage at [email or web address], password [facility]

## 2018-03-23 NOTE — Progress Notes (Signed)
OT Cancellation Note  Patient Details Name: TAKA GAFFKE MRN: [id number] DOB: February 17, 1956   Cancelled Treatment:    Reason Eval/Treat Not Completed: Medical issues which prohibited therapy(BP: 220/94, and 223/73 - RN and MD notified)  Evern Bio Erlin Gardella 03/23/2018, 10:11 AM  Sherryl Manges OTR/L [phone]

## 2018-03-24 ENCOUNTER — Inpatient Hospital Stay (HOSPITAL_COMMUNITY): Payer: Medicare HMO

## 2018-03-24 ENCOUNTER — Other Ambulatory Visit: Payer: Self-pay | Admitting: *Deleted

## 2018-03-24 DIAGNOSIS — I6522 Occlusion and stenosis of left carotid artery: Secondary | ICD-10-CM

## 2018-03-24 DIAGNOSIS — I63232 Cerebral infarction due to unspecified occlusion or stenosis of left carotid arteries: Secondary | ICD-10-CM

## 2018-03-24 LAB — RAPID URINE DRUG SCREEN, HOSP PERFORMED
Amphetamines: NOT DETECTED
Barbiturates: NOT DETECTED
Benzodiazepines: NOT DETECTED
Cocaine: NOT DETECTED
OPIATES: NOT DETECTED
TETRAHYDROCANNABINOL: NOT DETECTED

## 2018-03-24 LAB — GLUCOSE, RANDOM: GLUCOSE: 106 mg/dL — AB (ref 70–99)

## 2018-03-24 MED ORDER — ATORVASTATIN CALCIUM 80 MG PO TABS: 80.0000 mg | ORAL_TABLET | Freq: Every day | ORAL | 1 refills | Status: DC

## 2018-03-24 MED ORDER — CLONIDINE HCL 0.1 MG PO TABS: 0.1000 mg | ORAL_TABLET | Freq: Two times a day (BID) | ORAL | Status: DC

## 2018-03-24 MED ORDER — ASPIRIN 81 MG PO TBEC: 81.0000 mg | DELAYED_RELEASE_TABLET | Freq: Every day | ORAL | 0 refills | Status: AC

## 2018-03-24 MED ORDER — AMLODIPINE BESYLATE 10 MG PO TABS: 10.0000 mg | ORAL_TABLET | Freq: Every day | ORAL | 0 refills | Status: DC

## 2018-03-24 MED ORDER — CLONIDINE HCL 0.1 MG PO TABS: 0.1000 mg | ORAL_TABLET | Freq: Two times a day (BID) | ORAL | 1 refills | Status: DC

## 2018-03-24 MED ORDER — CLOPIDOGREL BISULFATE 75 MG PO TABS
75.0000 mg | ORAL_TABLET | Freq: Every day | ORAL | Status: DC
  Administered 2018-03-24: 75 mg via ORAL
  Filled 2018-03-24: qty 1

## 2018-03-24 MED ORDER — CLOPIDOGREL BISULFATE 75 MG PO TABS: 75.0000 mg | ORAL_TABLET | Freq: Every day | ORAL | 1 refills | Status: DC

## 2018-03-24 MED ORDER — IOPAMIDOL (ISOVUE-370) INJECTION 76%
50.0000 mL | Freq: Once | INTRAVENOUS | Status: AC | PRN
  Administered 2018-03-24: 100 mL via INTRAVENOUS

## 2018-03-24 NOTE — Progress Notes (Signed)
[facility] PROGRESS NOTE  Jade [name] ZOX:[id number] DOB: 1956/05/11 DOA: 03/21/2018  PCP: Frederich Chick., MD  Brief History/Interval Summary: 62 year old Caucasian female with a past medical history of his mellitus type II, morbid obesity, essential hypertension, COPD presented with complaints of numbness prominently involving the right side of her body.  She attributes this to taking losartan which was started recently by her primary care provider.  Evaluation was positive for stroke.  She was hospitalized for further management.  Reason for Visit: Acute stroke  Consultants: Neurology  Procedures:   Transthoracic echocardiogram Study Conclusions  - Left ventricle: The cavity size was normal. Systolic function was   normal. The estimated ejection fraction was in the range of 60%   to 65%. Wall motion was normal; there were no regional wall   motion abnormalities. Left ventricular diastolic function   parameters were normal.   Carotid Doppler was apparently done at her primary care physician's office on 7/22. Did not show any significant stenosis as per report available with patient  Antibiotics: None  Subjective/Interval History: Patient feels about the same.  No complaints offered.  Feels well overall.     ROS: Denies any nausea or vomiting  Objective:  Vital Signs  Vitals:   03/24/18 0034 03/24/18 0100 03/24/18 0903 03/24/18 1150  BP: (!) 191/65 (!) 171/74 (!) 165/69 (!) 183/81  Pulse: 71 74 71 81  Resp:   20 20  Temp:   97.6 F (36.4 C) 97.9 F (36.6 C)  TempSrc:   Oral Oral  SpO2:   98% 99%  Weight:      Height:       No intake or output data in the 24 hours ending 03/24/18 1214 Filed Weights   03/21/18 1102 03/21/18 2006  Weight: 82.1 kg (181 lb) 81.3 kg (179 lb 3.7 oz)    General appearance: Awake alert.  In no distress Resp: Clear to auscultation bilaterally.  No wheezing rales or rhonchi Cardio: S2 is normal regular.  No  S3-S4.  No rubs murmurs or bruit GI: Abdomen is soft.  Nontender nondistended Neurologic: Cranial nerves II to XII intact.  Motor strength appears to be equal bilateral upper and lower extremities.  Lab Results:  Data Reviewed: I have personally reviewed following labs and imaging studies  CBC: Recent Labs  Lab 03/21/18 1134 03/21/18 1159 03/21/18 2018 03/23/18 0622  WBC 7.4  --  7.6 8.8  NEUTROABS 5.4  --   --   --   HGB 13.9 13.3 14.4 13.9  HCT 42.4 39.0 44.6 42.6  MCV 95.9  --  95.5 95.3  PLT 229  --  266 283    Basic Metabolic Panel: Recent Labs  Lab 03/21/18 1134 03/21/18 1159 03/21/18 2018 03/22/18 0430 03/23/18 0622 03/24/18 0655  NA 143 143  --   --  142  --   K 4.7 4.5  --   --  3.3*  --   CL 108 107  --   --  110  --   CO2 27  --   --   --  23  --   GLUCOSE 151* 143*  --  105* 113* 106*  BUN 18 22  --   --  8  --   CREATININE 1.01* 0.80 0.91  --  0.86  --   CALCIUM 9.5  --   --   --  9.6  --     GFR: Estimated Creatinine Clearance: 66.2 mL/min (by  C-G formula based on SCr of 0.86 mg/dL).  Liver Function Tests: Recent Labs  Lab 03/21/18 1134  AST 23  ALT 16  ALKPHOS 60  BILITOT 0.7  PROT 6.9  ALBUMIN 4.0    Coagulation Profile: Recent Labs  Lab 03/21/18 1134  INR 0.90     HbA1C: Recent Labs    03/22/18 0430  HGBA1C 5.8*    CBG: Recent Labs  Lab 03/22/18 0331 03/22/18 1235 03/22/18 1654 03/22/18 2150 03/23/18 0624  GLUCAP 109* 103* 164* 92 98    Lipid Profile: Recent Labs    03/22/18 0430  CHOL 214*  HDL 64  LDLCALC 130*  TRIG 102  CHOLHDL 3.3    Thyroid Function Tests: Recent Labs    03/21/18 2018  TSH 1.755     Radiology Studies: Ct Angio Head W Or Wo Contrast  Result Date: 03/24/2018 CLINICAL DATA:  Follow up stroke. History of hypertension, hyperlipidemia, migraines. EXAM: CT ANGIOGRAPHY HEAD AND NECK TECHNIQUE: Multidetector CT imaging of the head and neck was performed using the standard protocol  during bolus administration of intravenous contrast. Multiplanar CT image reconstructions and MIPs were obtained to evaluate the vascular anatomy. Carotid stenosis measurements (when applicable) are obtained utilizing NASCET criteria, using the distal internal carotid diameter as the denominator. CONTRAST:  ISOVUE-370 IOPAMIDOL (ISOVUE-370) INJECTION 76% COMPARISON:  MRI of the head March 21, 2017 FINDINGS: CT HEAD FINDINGS BRAIN: No intraparenchymal hemorrhage, mass effect nor midline shift. Moderate parenchymal brain volume loss. Patchy to confluent supratentorial white matter hypodensities. No acute large vascular territory infarcts. Old LEFT thalamus and RIGHT basal ganglia lacunar infarcts. RIGHT middle cranial fossa arachnoid cyst. Basal cisterns are patent. VASCULAR: Minimal calcific atherosclerosis of the carotid siphons. SKULL: No skull fracture. No significant scalp soft tissue swelling. SINUSES/ORBITS: Mild paranasal sinus mucosal thickening. Sphenoid sinus air-fluid level. Mastoid air cells are well aerated.The included ocular globes and orbital contents are non-suspicious. OTHER: None. CTA NECK FINDINGS: AORTIC ARCH: Normal appearance of the thoracic arch, normal branch pattern. Mild calcific atherosclerosis. The origins of the innominate, left Common carotid artery and subclavian artery are patent. Mild stenosis proximal LEFT subclavian artery due to calcific atherosclerosis. RIGHT CAROTID SYSTEM: Common carotid artery is patent. Less than 50% stenosis RIGHT internal carotid artery due to atherosclerosis, by NASCET criteria. Patent internal carotid artery. LEFT CAROTID SYSTEM: Common carotid artery is patent. Calcific atherosclerosis resulting in 4 mm segment 80 stenosis internal carotid artery origin by NASCET criteria. Patent internal carotid artery VERTEBRAL ARTERIES:Left vertebral artery is dominant. Normal appearance of the vertebral arteries, widely patent. SKELETON: No acute osseous process  though bone windows have not been submitted. RIGHT mandible periapical abscess. OTHER NECK: Soft tissues of the neck are nonacute though, not tailored for evaluation. UPPER CHEST: Included lung apices are clear. No superior mediastinal lymphadenopathy. CTA HEAD FINDINGS: ANTERIOR CIRCULATION: Patent cervical internal carotid arteries, petrous, cavernous and supra clinoid internal carotid arteries. Patent anterior communicating artery. Patent anterior and middle cerebral arteries, moderate luminal irregularity compatible with atherosclerosis. No large vessel occlusion, significant stenosis, contrast extravasation or aneurysm. POSTERIOR CIRCULATION: Patent vertebral arteries, vertebrobasilar junction and basilar artery, as well as main branch vessels. Patent posterior cerebral arteries, moderate luminal irregularity compatible with atherosclerosis. No large vessel occlusion, significant stenosis, contrast extravasation or aneurysm. VENOUS SINUSES: Major dural venous sinuses are patent though not tailored for evaluation on this angiographic examination. ANATOMIC VARIANTS: None. DELAYED PHASE: No abnormal intracranial enhancement with particular attention to the LEFT operculum. MIP images reviewed. IMPRESSION:  CT HEAD: 1. No acute intracranial process. 2. Moderate parenchymal brain volume loss, advanced for age. 3. Moderate to severe chronic small vessel ischemic changes, advanced for age. CTA NECK: 1. 80% stenosis LEFT ICA.  Less than 50% stenosis RIGHT ICA. 2. Patent vertebral arteries. CTA HEAD: 1. No emergent large vessel occlusion or flow-limiting stenosis. 2. Moderate atherosclerosis. Electronically Signed   By: Awilda Metro M.D.   On: 03/24/2018 04:38   Ct Angio Neck W Or Wo Contrast  Result Date: 03/24/2018 CLINICAL DATA:  Follow up stroke. History of hypertension, hyperlipidemia, migraines. EXAM: CT ANGIOGRAPHY HEAD AND NECK TECHNIQUE: Multidetector CT imaging of the head and neck was performed using  the standard protocol during bolus administration of intravenous contrast. Multiplanar CT image reconstructions and MIPs were obtained to evaluate the vascular anatomy. Carotid stenosis measurements (when applicable) are obtained utilizing NASCET criteria, using the distal internal carotid diameter as the denominator. CONTRAST:  ISOVUE-370 IOPAMIDOL (ISOVUE-370) INJECTION 76% COMPARISON:  MRI of the head March 21, 2017 FINDINGS: CT HEAD FINDINGS BRAIN: No intraparenchymal hemorrhage, mass effect nor midline shift. Moderate parenchymal brain volume loss. Patchy to confluent supratentorial white matter hypodensities. No acute large vascular territory infarcts. Old LEFT thalamus and RIGHT basal ganglia lacunar infarcts. RIGHT middle cranial fossa arachnoid cyst. Basal cisterns are patent. VASCULAR: Minimal calcific atherosclerosis of the carotid siphons. SKULL: No skull fracture. No significant scalp soft tissue swelling. SINUSES/ORBITS: Mild paranasal sinus mucosal thickening. Sphenoid sinus air-fluid level. Mastoid air cells are well aerated.The included ocular globes and orbital contents are non-suspicious. OTHER: None. CTA NECK FINDINGS: AORTIC ARCH: Normal appearance of the thoracic arch, normal branch pattern. Mild calcific atherosclerosis. The origins of the innominate, left Common carotid artery and subclavian artery are patent. Mild stenosis proximal LEFT subclavian artery due to calcific atherosclerosis. RIGHT CAROTID SYSTEM: Common carotid artery is patent. Less than 50% stenosis RIGHT internal carotid artery due to atherosclerosis, by NASCET criteria. Patent internal carotid artery. LEFT CAROTID SYSTEM: Common carotid artery is patent. Calcific atherosclerosis resulting in 4 mm segment 80 stenosis internal carotid artery origin by NASCET criteria. Patent internal carotid artery VERTEBRAL ARTERIES:Left vertebral artery is dominant. Normal appearance of the vertebral arteries, widely patent. SKELETON: No  acute osseous process though bone windows have not been submitted. RIGHT mandible periapical abscess. OTHER NECK: Soft tissues of the neck are nonacute though, not tailored for evaluation. UPPER CHEST: Included lung apices are clear. No superior mediastinal lymphadenopathy. CTA HEAD FINDINGS: ANTERIOR CIRCULATION: Patent cervical internal carotid arteries, petrous, cavernous and supra clinoid internal carotid arteries. Patent anterior communicating artery. Patent anterior and middle cerebral arteries, moderate luminal irregularity compatible with atherosclerosis. No large vessel occlusion, significant stenosis, contrast extravasation or aneurysm. POSTERIOR CIRCULATION: Patent vertebral arteries, vertebrobasilar junction and basilar artery, as well as main branch vessels. Patent posterior cerebral arteries, moderate luminal irregularity compatible with atherosclerosis. No large vessel occlusion, significant stenosis, contrast extravasation or aneurysm. VENOUS SINUSES: Major dural venous sinuses are patent though not tailored for evaluation on this angiographic examination. ANATOMIC VARIANTS: None. DELAYED PHASE: No abnormal intracranial enhancement with particular attention to the LEFT operculum. MIP images reviewed. IMPRESSION: CT HEAD: 1. No acute intracranial process. 2. Moderate parenchymal brain volume loss, advanced for age. 3. Moderate to severe chronic small vessel ischemic changes, advanced for age. CTA NECK: 1. 80% stenosis LEFT ICA.  Less than 50% stenosis RIGHT ICA. 2. Patent vertebral arteries. CTA HEAD: 1. No emergent large vessel occlusion or flow-limiting stenosis. 2. Moderate atherosclerosis. Electronically Signed  By: Awilda Metro M.D.   On: 03/24/2018 04:38     Medications:  Scheduled: . amLODipine  5 mg Oral Daily  . aspirin EC  81 mg Oral Daily  . atorvastatin  80 mg Oral q1800  . cloNIDine  0.1 mg Oral Daily  . clopidogrel  75 mg Oral Daily  . heparin  5,000 Units Subcutaneous  Q8H   Continuous:  [id number] **OR** acetaminophen (TYLENOL) oral liquid 160 mg/5 mL **OR** acetaminophen, hydrALAZINE, senna-docusate  Assessment/Plan:    Acute CVA Echocardiogram is as above.  Patient had recent carotid Doppler at her primary care provider's office.  This did not show any significant stenosis.  Patient currently on aspirin.  Looks like Plavix was also initiated today.  Statin was also initiated.  LDL was 130.  HbA1c 5.8.  Not yet seen by PT and OT due to elevated blood pressures.  Hopefully they will evaluate her today.  Further management per neurology.  It appears that they ordered a CT angiogram neck which suggests 80% stenosis in the left carotid.  Surprising considering Doppler study which did not show any significant stenosis.  Defer to neurology.   Accelerated hypertension Blood pressure noted to be quite significantly elevated.  Some degree of permissive hypertension was allowed.  She was started on amlodipine by neurology.  At home patient is on clonidine and furosemide. And was also recently started on losartan which she stopped because she attributed her symptoms to this medication.  There could be an element of rebound hypertension as well.  It appears that her hypertension was very recently diagnosed may be 2 to 3 weeks ago.  She states that she has not been seeing any doctors for a while.  It is quite possible patient had undiagnosed hypertension for long time.  Started back on her clonidine yesterday.  Blood pressure appears to be somewhat better.  We will increase the dose of clonidine.   Diabetes mellitus type 2 Patient states that this is diet-controlled.  HbA1c 5.8.  Obstructive sleep apnea Outpatient follow-up with primary care provider.  History of COPD Stable.  Hypokalemia Will be repleted  DVT Prophylaxis: Subcutaneous heparin    Code Status: Full code Family Communication: Discussed with the patient Disposition Plan: Await further  neurology input.  Await PT and OT evaluation.    LOS: 3 days   Osvaldo Shipper  [facility] Pager [phone] 03/24/2018, 12:14 PM  If 7PM-7AM, please contact night-coverage at [email or web address], password [facility]

## 2018-03-24 NOTE — Progress Notes (Signed)
STROKE TEAM PROGRESS NOTE   SUBJECTIVE (INTERVAL HISTORY) No family at bedside.  Patient still feel right-sided subjective numbness, however on exam sensation intact.  CTA head and neck showed left ICA high-grade stenosis, which is different from her previous carotid Doppler about 1 week ago done at [facility].  Will have VVS Dr. early to take a look.   OBJECTIVE Temp:  [97.7 F (36.5 C)-98.3 F (36.8 C)] 98.3 F (36.8 C) (07/29 0022) Pulse Rate:  [70-110] 74 (07/29 0100) Cardiac Rhythm: Normal sinus rhythm (07/29 0700) Resp:  [18-20] 18 (07/29 0022) BP: (143-204)/(59-81) 171/74 (07/29 0100) SpO2:  [98 %-100 %] 99 % (07/29 0022)  CBC:  Recent Labs  Lab 03/21/18 1134  03/21/18 2018 03/23/18 0622  WBC 7.4  --  7.6 8.8  NEUTROABS 5.4  --   --   --   HGB 13.9   < > 14.4 13.9  HCT 42.4   < > 44.6 42.6  MCV 95.9  --  95.5 95.3  PLT 229  --  266 283   < > = values in this interval not displayed.    Basic Metabolic Panel:  Recent Labs  Lab 03/21/18 1134 03/21/18 1159 03/21/18 2018  03/23/18 0622 03/24/18 0655  NA 143 143  --   --  142  --   K 4.7 4.5  --   --  3.3*  --   CL 108 107  --   --  110  --   CO2 27  --   --   --  23  --   GLUCOSE 151* 143*  --    < > 113* 106*  BUN 18 22  --   --  8  --   CREATININE 1.01* 0.80 0.91  --  0.86  --   CALCIUM 9.5  --   --   --  9.6  --    < > = values in this interval not displayed.    Lipid Panel:     Component Value Date/Time   CHOL 214 (H) 03/22/2018 0430   TRIG 102 03/22/2018 0430   HDL 64 03/22/2018 0430   CHOLHDL 3.3 03/22/2018 0430   VLDL 20 03/22/2018 0430   LDLCALC 130 (H) 03/22/2018 0430   HgbA1c:  Lab Results  Component Value Date   HGBA1C 5.8 (H) 03/22/2018   Urine Drug Screen: No results found for: LABOPIA, COCAINSCRNUR, LABBENZ, AMPHETMU, THCU, LABBARB  Alcohol Level     Component Value Date/Time   ETH <10 03/21/2018 1134    IMAGING   Ct Head Wo Contrast 03/21/2018 IMPRESSION:  1.  No acute intracranial abnormality.  2. Age advanced cerebral cortical volume loss.  3. 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    Mr Laqueta Jean And Wo Contrast 03/21/2018 IMPRESSION:  1. 6 mm acute nonhemorrhagic infarct along the posterior limb of the right internal capsule or lateral thalamus correlates with right-sided weakness.  2. Focal subcortical enhancement the left frontal operculum. This may represent a early chronic infarct. There is no definite mass lesion. Recommend follow-up MRI without and with contrast in 1-2 months.  3. Atrophy and white matter disease are advanced for age. This likely reflects the sequela of chronic microvascular ischemia.   Transthoracic Echocardiogram  03/22/2018 Study Conclusions  - Left ventricle: The cavity size was normal. Systolic function was   normal. The estimated ejection fraction was in the range of 60%   to 65%. Wall motion was normal; there  were no regional wall   motion abnormalities. Left ventricular diastolic function   parameters were normal.   Bilateral Carotid Dopplers - Pt had carotid dopplers at [facility] 03/17/18.  Ct Angio Head W Or Wo Contrast  Result Date: 03/24/2018 CLINICAL DATA:  Follow up stroke. History of hypertension, hyperlipidemia, migraines. EXAM: CT ANGIOGRAPHY HEAD AND NECK TECHNIQUE: Multidetector CT imaging of the head and neck was performed using the standard protocol during bolus administration of intravenous contrast. Multiplanar CT image reconstructions and MIPs were obtained to evaluate the vascular anatomy. Carotid stenosis measurements (when applicable) are obtained utilizing NASCET criteria, using the distal internal carotid diameter as the denominator. CONTRAST:  ISOVUE-370 IOPAMIDOL (ISOVUE-370) INJECTION 76% COMPARISON:  MRI of the head March 21, 2017 FINDINGS: CT HEAD FINDINGS BRAIN: No intraparenchymal hemorrhage, mass effect nor midline shift. Moderate parenchymal  brain volume loss. Patchy to confluent supratentorial white matter hypodensities. No acute large vascular territory infarcts. Old LEFT thalamus and RIGHT basal ganglia lacunar infarcts. RIGHT middle cranial fossa arachnoid cyst. Basal cisterns are patent. VASCULAR: Minimal calcific atherosclerosis of the carotid siphons. SKULL: No skull fracture. No significant scalp soft tissue swelling. SINUSES/ORBITS: Mild paranasal sinus mucosal thickening. Sphenoid sinus air-fluid level. Mastoid air cells are well aerated.The included ocular globes and orbital contents are non-suspicious. OTHER: None. CTA NECK FINDINGS: AORTIC ARCH: Normal appearance of the thoracic arch, normal branch pattern. Mild calcific atherosclerosis. The origins of the innominate, left Common carotid artery and subclavian artery are patent. Mild stenosis proximal LEFT subclavian artery due to calcific atherosclerosis. RIGHT CAROTID SYSTEM: Common carotid artery is patent. Less than 50% stenosis RIGHT internal carotid artery due to atherosclerosis, by NASCET criteria. Patent internal carotid artery. LEFT CAROTID SYSTEM: Common carotid artery is patent. Calcific atherosclerosis resulting in 4 mm segment 80 stenosis internal carotid artery origin by NASCET criteria. Patent internal carotid artery VERTEBRAL ARTERIES:Left vertebral artery is dominant. Normal appearance of the vertebral arteries, widely patent. SKELETON: No acute osseous process though bone windows have not been submitted. RIGHT mandible periapical abscess. OTHER NECK: Soft tissues of the neck are nonacute though, not tailored for evaluation. UPPER CHEST: Included lung apices are clear. No superior mediastinal lymphadenopathy. CTA HEAD FINDINGS: ANTERIOR CIRCULATION: Patent cervical internal carotid arteries, petrous, cavernous and supra clinoid internal carotid arteries. Patent anterior communicating artery. Patent anterior and middle cerebral arteries, moderate luminal irregularity  compatible with atherosclerosis. No large vessel occlusion, significant stenosis, contrast extravasation or aneurysm. POSTERIOR CIRCULATION: Patent vertebral arteries, vertebrobasilar junction and basilar artery, as well as main branch vessels. Patent posterior cerebral arteries, moderate luminal irregularity compatible with atherosclerosis. No large vessel occlusion, significant stenosis, contrast extravasation or aneurysm. VENOUS SINUSES: Major dural venous sinuses are patent though not tailored for evaluation on this angiographic examination. ANATOMIC VARIANTS: None. DELAYED PHASE: No abnormal intracranial enhancement with particular attention to the LEFT operculum. MIP images reviewed. IMPRESSION: CT HEAD: 1. No acute intracranial process. 2. Moderate parenchymal brain volume loss, advanced for age. 3. Moderate to severe chronic small vessel ischemic changes, advanced for age. CTA NECK: 1. 80% stenosis LEFT ICA.  Less than 50% stenosis RIGHT ICA. 2. Patent vertebral arteries. CTA HEAD: 1. No emergent large vessel occlusion or flow-limiting stenosis. 2. Moderate atherosclerosis. Electronically Signed   By: Awilda Metro M.D.   On: 03/24/2018 04:38   Ct Angio Neck W Or Wo Contrast  Result Date: 03/24/2018 CLINICAL DATA:  Follow up stroke. History of hypertension, hyperlipidemia, migraines. EXAM: CT ANGIOGRAPHY HEAD AND NECK TECHNIQUE: Multidetector  CT imaging of the head and neck was performed using the standard protocol during bolus administration of intravenous contrast. Multiplanar CT image reconstructions and MIPs were obtained to evaluate the vascular anatomy. Carotid stenosis measurements (when applicable) are obtained utilizing NASCET criteria, using the distal internal carotid diameter as the denominator. CONTRAST:  ISOVUE-370 IOPAMIDOL (ISOVUE-370) INJECTION 76% COMPARISON:  MRI of the head March 21, 2017 FINDINGS: CT HEAD FINDINGS BRAIN: No intraparenchymal hemorrhage, mass effect nor  midline shift. Moderate parenchymal brain volume loss. Patchy to confluent supratentorial white matter hypodensities. No acute large vascular territory infarcts. Old LEFT thalamus and RIGHT basal ganglia lacunar infarcts. RIGHT middle cranial fossa arachnoid cyst. Basal cisterns are patent. VASCULAR: Minimal calcific atherosclerosis of the carotid siphons. SKULL: No skull fracture. No significant scalp soft tissue swelling. SINUSES/ORBITS: Mild paranasal sinus mucosal thickening. Sphenoid sinus air-fluid level. Mastoid air cells are well aerated.The included ocular globes and orbital contents are non-suspicious. OTHER: None. CTA NECK FINDINGS: AORTIC ARCH: Normal appearance of the thoracic arch, normal branch pattern. Mild calcific atherosclerosis. The origins of the innominate, left Common carotid artery and subclavian artery are patent. Mild stenosis proximal LEFT subclavian artery due to calcific atherosclerosis. RIGHT CAROTID SYSTEM: Common carotid artery is patent. Less than 50% stenosis RIGHT internal carotid artery due to atherosclerosis, by NASCET criteria. Patent internal carotid artery. LEFT CAROTID SYSTEM: Common carotid artery is patent. Calcific atherosclerosis resulting in 4 mm segment 80 stenosis internal carotid artery origin by NASCET criteria. Patent internal carotid artery VERTEBRAL ARTERIES:Left vertebral artery is dominant. Normal appearance of the vertebral arteries, widely patent. SKELETON: No acute osseous process though bone windows have not been submitted. RIGHT mandible periapical abscess. OTHER NECK: Soft tissues of the neck are nonacute though, not tailored for evaluation. UPPER CHEST: Included lung apices are clear. No superior mediastinal lymphadenopathy. CTA HEAD FINDINGS: ANTERIOR CIRCULATION: Patent cervical internal carotid arteries, petrous, cavernous and supra clinoid internal carotid arteries. Patent anterior communicating artery. Patent anterior and middle cerebral arteries,  moderate luminal irregularity compatible with atherosclerosis. No large vessel occlusion, significant stenosis, contrast extravasation or aneurysm. POSTERIOR CIRCULATION: Patent vertebral arteries, vertebrobasilar junction and basilar artery, as well as main branch vessels. Patent posterior cerebral arteries, moderate luminal irregularity compatible with atherosclerosis. No large vessel occlusion, significant stenosis, contrast extravasation or aneurysm. VENOUS SINUSES: Major dural venous sinuses are patent though not tailored for evaluation on this angiographic examination. ANATOMIC VARIANTS: None. DELAYED PHASE: No abnormal intracranial enhancement with particular attention to the LEFT operculum. MIP images reviewed. IMPRESSION: CT HEAD: 1. No acute intracranial process. 2. Moderate parenchymal brain volume loss, advanced for age. 3. Moderate to severe chronic small vessel ischemic changes, advanced for age. CTA NECK: 1. 80% stenosis LEFT ICA.  Less than 50% stenosis RIGHT ICA. 2. Patent vertebral arteries. CTA HEAD: 1. No emergent large vessel occlusion or flow-limiting stenosis. 2. Moderate atherosclerosis. Electronically Signed   By: Awilda Metro M.D.   On: 03/24/2018 04:38       PHYSICAL EXAM Vitals:   03/23/18 2022 03/24/18 0022 03/24/18 0034 03/24/18 0100  BP: (!) 154/59 (!) 203/71 (!) 191/65 (!) 171/74  Pulse: 87 70 71 74  Resp: 18 18    Temp: 97.8 F (36.6 C) 98.3 F (36.8 C)    TempSrc: Oral Oral    SpO2: 100% 99%    Weight:      Height:        Awake, alert, fully oriented.  Language- fluent.  Comprehension, naming, repetition - intact. Face symmetric.  Tongue midline. Strength 5/5 BUE and BLE. Coord- FTN, HTS intact bilaterally. Sensory - subjectively decreased light touch and pinprick in the right face, arm, and leg, however, on exam, symmetrical in light touch.     ASSESSMENT/PLAN Ms. DEIONNA MARCANTONIO is a 62 y.o. female with history of COPD, Hld, DM, OSA, Htn,  strokes, CKD, and neuropathy presenting with dizziness.  She did not receive IV t-PA due to late presentation.   Stroke:  infarct along the posterior limb of the right internal capsule or lateral thalamus  - small vessel  Resultant  Right hemianesthesia  CT head - No acute intracranial abnormality.   MRI head - 6 mm acute nonhemorrhagic infarct along the posterior limb of the right internal capsule or lateral thalamus  MRA head - not performed  Carotid Doppler -  Pt had carotid doppler at [facility] 03/17/18. No need to repeat per Dr Laurence Slate.  2D Echo - EF 60 to 65%. No cardiac source of emboli identified.  LDL - 130  HgbA1c - 5.8  VTE prophylaxis - [place] Heprin Diet Order           Diet heart healthy/carb modified Room service appropriate? Yes; Fluid consistency: Thin  Diet effective now          No antithrombotic prior to admission, now on aspirin 81 mg daily  Patient counseled to be compliant with her antithrombotic medications  Ongoing aggressive stroke risk factor management  Therapy recommendations:  Outpatient PT recommended.  Disposition:  Pending  Hypertension  BP high at times - symptoms x 1 week - will treat elevated SBP gradually . Permissive hypertension (OK if < 220/120) but gradually normalize in 5-7 days . Long-term BP goal normotensive  Hyperlipidemia  Lipid lowering medication PTA:  none  LDL 130 goal < 70  Current lipid lowering medication: Lipitor 80 mg daily  Continue statin at discharge  Diabetes  HgbA1c 5.8, goal < 7.0  Controlled  Other Stroke Risk Factors  Advanced age  Former cigarette smoker - quit  Former ETOH use  Obesity, Body mass index is 32.78 kg/m., recommend weight loss, diet and exercise as appropriate   Hx stroke/TIA  Migraines  Obstructive sleep apnea, not on CPAP at home (doesn't tolerate)  Other Active Problems   Focal subcortical enhancement the left frontal operculum. This may represent a  early chronic infarct. There is no definite mass lesion. Recommend follow-up MRI without and with contrast in 1-2 months.   BP high at times - symptoms x 1 week - treat elevated SBP gradually - Catapres restarted per Dr Rito Ehrlich.     Hospital day # 3  ATTENDING NOTE: I reviewed above note and agree with the assessment and plan. I have made any additions or clarifications directly to the above note. Pt was seen and examined.   62 year old female with history of CKD, diabetes, hypertension, hyperlipidemia, OSA admitted for dizziness, nausea vomiting and right-sided numbness feeding.  CT negative.  MRI showed left thalamic/IC infarct.  EF 60 to 65%.  LDL 130 and A1c 5.8.  UDS negative.  Had CTA head and neck showed left ICA 80% stenosis and right ICA less than 50% stenosis, which was different from her carotid Doppler testing 1 week ago at [facility].  Had Dr. early consultation, recommended to have left CEA done within 2 weeks.  In the meantime, continue aspirin 81 and Plavix for 3 weeks and then Plavix alone.  Avoid hypotension BP goal 130-150 prior to  procedure.  Continue Lipitor 80.  Stroke risk factor modification.  Neurology will sign off. Please call with questions. Pt will follow up with stroke clinic NP at [facility] in about 4 weeks. Thanks for the consult.   Marvel Plan, MD PhD Stroke Neurology 03/25/2018 12:34 AM   To contact Stroke Continuity provider, please refer to [email or web address]. After hours, contact General Neurology

## 2018-03-24 NOTE — Care Management Note (Signed)
Case Management Note  Patient Details  Name: Jade [name] MRN: [id number] Date of Birth: 07/03/1956  Subjective/Objective:                    Action/Plan: Pt discharging home with self care. CM consulted for outpatient therapy. CM met with the patient and she would like to attend at [address] outpatient rehab. Orders in Epic and information on the AVS.  Pt states her spouse will provide transportation home.   Expected Discharge Date:  03/24/18               Expected Discharge Plan:  OP Rehab  In-House Referral:     Discharge planning Services  CM Consult  Post Acute Care Choice:    Choice offered to:     DME Arranged:    DME Agency:     HH Arranged:    HH Agency:     Status of Service:  Completed, signed off  If discussed at Long Length of Stay Meetings, dates discussed:    Additional Comments:   F , RN 03/24/2018, 4:07 PM  

## 2018-03-24 NOTE — Progress Notes (Signed)
Pt being discharged from hospital per orders from MD. Pt educated on discharge instructions. Pt verbalized understanding of instructions. All questions and concerns were addressed. Pt's IV was removed prior to discharge. Pt exited hospital via wheelchair accompanied by staff. 

## 2018-03-24 NOTE — Discharge Summary (Signed)
[facility]  Physician Discharge Summary   Patient ID: Jade [name] MRN: [id number] DOB/AGE: [birth year] 62 y.o.  Admit date: 03/21/2018 Discharge date: 03/24/2018  PCP: Sherald Hess., MD  DISCHARGE DIAGNOSES:  Acute CVA Carotid artery disease Accelerated hypertension Diabetes mellitus type 2, diet-controlled  RECOMMENDATIONS FOR OUTPATIENT FOLLOW UP: 1. Vascular surgery to schedule outpatient appointment and elective carotid endarterectomy in 1 to 2 weeks 2. Referral sent to neurology for follow-up 3. Patient instructed to follow-up with her PCP late this week or early next week for blood pressure check   DISCHARGE CONDITION: fair  Diet recommendation: Modified carbohydrate  Filed Weights   03/21/18 1102 03/21/18 2006  Weight: 82.1 kg (181 lb) 81.3 kg (179 lb 3.7 oz)    INITIAL HISTORY: 62 year old Caucasian female with a past medical history of his mellitus type II, morbid obesity, essential hypertension, COPD presented with complaints of numbness prominently involving the right side of her body.  She attributes this to taking losartan which was started recently by her primary care provider.  Evaluation was positive for stroke.  She was hospitalized for further management.  Consultants: Neurology  Procedures:   Transthoracic echocardiogram Study Conclusions  - Left ventricle: The cavity size was normal. Systolic function was normal. The estimated ejection fraction was in the range of 60% to 65%. Wall motion was normal; there were no regional wall motion abnormalities. Left ventricular diastolic function parameters were normal.   Carotid Doppler was apparently done at her primary care physician's office on 7/22. Did not show any significant stenosis as per report available with patient   HOSPITAL COURSE:   Acute CVA Echocardiogram is as above.  Patient was initially placed on aspirin.  Plavix was added.  Statin was also  initiated.  LDL was 130.  HbA1c 5.8.  Neurology recommends aspirin and Plavix for 3 weeks followed by Plavix alone.  Seen by PT and OT.  Outpatient PT recommended.  See below regarding carotid artery disease.  Carotid artery disease Patient had a recent carotid Doppler done at her PCPs office which did not show any significant stenosis.  A CT angiogram of the neck was ordered by neurology which showed a 80% stenosis of the left internal carotid artery.  Could not explain this discrepancy.  Vascular surgery was consulted who feel that the CT findings are real.  They are recommending close follow-up as an elective left carotid endarterectomy in 1 to 2 weeks.  They have discussed with patient who is agreeable.   Accelerated hypertension Blood pressure noted to be quite significantly elevated.  Some degree of permissive hypertension was allowed.  She was started on amlodipine by neurology.  At home patient is on clonidine and furosemide. And was also recently started on losartan which she stopped because she attributed her symptoms to this medication.  There could be an element of rebound hypertension as well.  It appears that her hypertension was very recently diagnosed may be 2 to 3 weeks ago.  She states that she has not been seeing any doctors for a while.  It is quite possible patient had undiagnosed hypertension for long time.  She was started back on clonidine which was increased to twice a day.  Amlodipine will be increased to 10 mg.  Close follow-up with PCP with gradual normalization of blood pressure.  Would avoid being too aggressive with BP control at least until the carotid artery disease has been addressed by surgery.  Diabetes mellitus type 2 Patient states that  this is diet-controlled.  HbA1c 5.8.  Obstructive sleep apnea Outpatient follow-up with primary care provider.  History of COPD Stable.  Hypokalemia Repleted  Overall stable.  Okay for discharge home today.  Discussed in  detail with patient, neurology and vascular surgery.    PERTINENT LABS:  The results of significant diagnostics from this hospitalization (including imaging, microbiology, ancillary and laboratory) are listed below for reference.      Labs: Basic Metabolic Panel: Recent Labs  Lab 03/21/18 1134 03/21/18 1159 03/21/18 2018 03/22/18 0430 03/23/18 0622 03/24/18 0655  NA 143 143  --   --  142  --   K 4.7 4.5  --   --  3.3*  --   CL 108 107  --   --  110  --   CO2 27  --   --   --  23  --   GLUCOSE 151* 143*  --  105* 113* 106*  BUN 18 22  --   --  8  --   CREATININE 1.01* 0.80 0.91  --  0.86  --   CALCIUM 9.5  --   --   --  9.6  --    Liver Function Tests: Recent Labs  Lab 03/21/18 1134  AST 23  ALT 16  ALKPHOS 60  BILITOT 0.7  PROT 6.9  ALBUMIN 4.0   CBC: Recent Labs  Lab 03/21/18 1134 03/21/18 1159 03/21/18 2018 03/23/18 0622  WBC 7.4  --  7.6 8.8  NEUTROABS 5.4  --   --   --   HGB 13.9 13.3 14.4 13.9  HCT 42.4 39.0 44.6 42.6  MCV 95.9  --  95.5 95.3  PLT 229  --  266 283    CBG: Recent Labs  Lab 03/22/18 0331 03/22/18 1235 03/22/18 1654 03/22/18 2150 03/23/18 0624  GLUCAP 109* 103* 164* 92 98     IMAGING STUDIES Ct Angio Head W Or Wo Contrast  Result Date: 03/24/2018 CLINICAL DATA:  Follow up stroke. History of hypertension, hyperlipidemia, migraines. EXAM: CT ANGIOGRAPHY HEAD AND NECK TECHNIQUE: Multidetector CT imaging of the head and neck was performed using the standard protocol during bolus administration of intravenous contrast. Multiplanar CT image reconstructions and MIPs were obtained to evaluate the vascular anatomy. Carotid stenosis measurements (when applicable) are obtained utilizing NASCET criteria, using the distal internal carotid diameter as the denominator. CONTRAST:  159m ISOVUE-370 IOPAMIDOL (ISOVUE-370) INJECTION 76% COMPARISON:  MRI of the head March 21, 2017 FINDINGS: CT HEAD FINDINGS BRAIN: No intraparenchymal hemorrhage,  mass effect nor midline shift. Moderate parenchymal brain volume loss. Patchy to confluent supratentorial white matter hypodensities. No acute large vascular territory infarcts. Old LEFT thalamus and RIGHT basal ganglia lacunar infarcts. RIGHT middle cranial fossa arachnoid cyst. Basal cisterns are patent. VASCULAR: Minimal calcific atherosclerosis of the carotid siphons. SKULL: No skull fracture. No significant scalp soft tissue swelling. SINUSES/ORBITS: Mild paranasal sinus mucosal thickening. Sphenoid sinus air-fluid level. Mastoid air cells are well aerated.The included ocular globes and orbital contents are non-suspicious. OTHER: None. CTA NECK FINDINGS: AORTIC ARCH: Normal appearance of the thoracic arch, normal branch pattern. Mild calcific atherosclerosis. The origins of the innominate, left Common carotid artery and subclavian artery are patent. Mild stenosis proximal LEFT subclavian artery due to calcific atherosclerosis. RIGHT CAROTID SYSTEM: Common carotid artery is patent. Less than 50% stenosis RIGHT internal carotid artery due to atherosclerosis, by NASCET criteria. Patent internal carotid artery. LEFT CAROTID SYSTEM: Common carotid artery is patent. Calcific atherosclerosis resulting in 4  mm segment 80 stenosis internal carotid artery origin by NASCET criteria. Patent internal carotid artery VERTEBRAL ARTERIES:Left vertebral artery is dominant. Normal appearance of the vertebral arteries, widely patent. SKELETON: No acute osseous process though bone windows have not been submitted. RIGHT mandible periapical abscess. OTHER NECK: Soft tissues of the neck are nonacute though, not tailored for evaluation. UPPER CHEST: Included lung apices are clear. No superior mediastinal lymphadenopathy. CTA HEAD FINDINGS: ANTERIOR CIRCULATION: Patent cervical internal carotid arteries, petrous, cavernous and supra clinoid internal carotid arteries. Patent anterior communicating artery. Patent anterior and middle  cerebral arteries, moderate luminal irregularity compatible with atherosclerosis. No large vessel occlusion, significant stenosis, contrast extravasation or aneurysm. POSTERIOR CIRCULATION: Patent vertebral arteries, vertebrobasilar junction and basilar artery, as well as main branch vessels. Patent posterior cerebral arteries, moderate luminal irregularity compatible with atherosclerosis. No large vessel occlusion, significant stenosis, contrast extravasation or aneurysm. VENOUS SINUSES: Major dural venous sinuses are patent though not tailored for evaluation on this angiographic examination. ANATOMIC VARIANTS: None. DELAYED PHASE: No abnormal intracranial enhancement with particular attention to the LEFT operculum. MIP images reviewed. IMPRESSION: CT HEAD: 1. No acute intracranial process. 2. Moderate parenchymal brain volume loss, advanced for age. 3. Moderate to severe chronic small vessel ischemic changes, advanced for age. CTA NECK: 1. 80% stenosis LEFT ICA.  Less than 50% stenosis RIGHT ICA. 2. Patent vertebral arteries. CTA HEAD: 1. No emergent large vessel occlusion or flow-limiting stenosis. 2. Moderate atherosclerosis. Electronically Signed   By: Elon Alas M.D.   On: 03/24/2018 04:38   Ct Head Wo Contrast  Result Date: 03/21/2018 CLINICAL DATA:  62 year old female with nausea, vomiting and dizziness EXAM: CT HEAD WITHOUT CONTRAST TECHNIQUE: Contiguous axial images were obtained from the base of the skull through the vertex without intravenous contrast. COMPARISON:  None. FINDINGS: Brain: No evidence of acute infarction, hemorrhage, hydrocephalus, extra-axial collection or mass lesion/mass effect. Moderate cerebral cortical volume loss, advanced for age. Moderate periventricular white matter hypoattenuation most consistent with sequelae of chronic microvascular ischemic white matter disease. Vascular: No hyperdense vessel or unexpected calcification. Skull: Normal. Negative for fracture or  focal lesion. Sinuses/Orbits: Small amount of frothy secretions present within the left sphenoid air cell. Otherwise, the paranasal sinuses are clear. Other: None. IMPRESSION: 1. No acute intracranial abnormality. 2. Age advanced cerebral cortical volume loss. 3. 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 Electronically Signed   By: Jacqulynn Cadet M.D.   On: 03/21/2018 11:34   Ct Angio Neck W Or Wo Contrast  Result Date: 03/24/2018 CLINICAL DATA:  Follow up stroke. History of hypertension, hyperlipidemia, migraines. EXAM: CT ANGIOGRAPHY HEAD AND NECK TECHNIQUE: Multidetector CT imaging of the head and neck was performed using the standard protocol during bolus administration of intravenous contrast. Multiplanar CT image reconstructions and MIPs were obtained to evaluate the vascular anatomy. Carotid stenosis measurements (when applicable) are obtained utilizing NASCET criteria, using the distal internal carotid diameter as the denominator. CONTRAST:  1108m ISOVUE-370 IOPAMIDOL (ISOVUE-370) INJECTION 76% COMPARISON:  MRI of the head March 21, 2017 FINDINGS: CT HEAD FINDINGS BRAIN: No intraparenchymal hemorrhage, mass effect nor midline shift. Moderate parenchymal brain volume loss. Patchy to confluent supratentorial white matter hypodensities. No acute large vascular territory infarcts. Old LEFT thalamus and RIGHT basal ganglia lacunar infarcts. RIGHT middle cranial fossa arachnoid cyst. Basal cisterns are patent. VASCULAR: Minimal calcific atherosclerosis of the carotid siphons. SKULL: No skull fracture. No significant scalp soft tissue swelling. SINUSES/ORBITS: Mild paranasal sinus mucosal thickening. Sphenoid sinus air-fluid level. Mastoid air cells  are well aerated.The included ocular globes and orbital contents are non-suspicious. OTHER: None. CTA NECK FINDINGS: AORTIC ARCH: Normal appearance of the thoracic arch, normal branch pattern. Mild calcific atherosclerosis. The origins of the  innominate, left Common carotid artery and subclavian artery are patent. Mild stenosis proximal LEFT subclavian artery due to calcific atherosclerosis. RIGHT CAROTID SYSTEM: Common carotid artery is patent. Less than 50% stenosis RIGHT internal carotid artery due to atherosclerosis, by NASCET criteria. Patent internal carotid artery. LEFT CAROTID SYSTEM: Common carotid artery is patent. Calcific atherosclerosis resulting in 4 mm segment 80 stenosis internal carotid artery origin by NASCET criteria. Patent internal carotid artery VERTEBRAL ARTERIES:Left vertebral artery is dominant. Normal appearance of the vertebral arteries, widely patent. SKELETON: No acute osseous process though bone windows have not been submitted. RIGHT mandible periapical abscess. OTHER NECK: Soft tissues of the neck are nonacute though, not tailored for evaluation. UPPER CHEST: Included lung apices are clear. No superior mediastinal lymphadenopathy. CTA HEAD FINDINGS: ANTERIOR CIRCULATION: Patent cervical internal carotid arteries, petrous, cavernous and supra clinoid internal carotid arteries. Patent anterior communicating artery. Patent anterior and middle cerebral arteries, moderate luminal irregularity compatible with atherosclerosis. No large vessel occlusion, significant stenosis, contrast extravasation or aneurysm. POSTERIOR CIRCULATION: Patent vertebral arteries, vertebrobasilar junction and basilar artery, as well as main branch vessels. Patent posterior cerebral arteries, moderate luminal irregularity compatible with atherosclerosis. No large vessel occlusion, significant stenosis, contrast extravasation or aneurysm. VENOUS SINUSES: Major dural venous sinuses are patent though not tailored for evaluation on this angiographic examination. ANATOMIC VARIANTS: None. DELAYED PHASE: No abnormal intracranial enhancement with particular attention to the LEFT operculum. MIP images reviewed. IMPRESSION: CT HEAD: 1. No acute intracranial  process. 2. Moderate parenchymal brain volume loss, advanced for age. 3. Moderate to severe chronic small vessel ischemic changes, advanced for age. CTA NECK: 1. 80% stenosis LEFT ICA.  Less than 50% stenosis RIGHT ICA. 2. Patent vertebral arteries. CTA HEAD: 1. No emergent large vessel occlusion or flow-limiting stenosis. 2. Moderate atherosclerosis. Electronically Signed   By: Elon Alas M.D.   On: 03/24/2018 04:38   Mr Jeri Cos And Wo Contrast  Result Date: 03/21/2018 CLINICAL DATA:  Numbness or tingling, paresthesias. Episode of right-sided weakness 6 days ago which resolved. EXAM: MRI HEAD WITHOUT AND WITH CONTRAST TECHNIQUE: Multiplanar, multiecho pulse sequences of the brain and surrounding structures were obtained without and with intravenous contrast. CONTRAST:  18m MULTIHANCE GADOBENATE DIMEGLUMINE 529 MG/ML IV SOLN COMPARISON:  CT head without contrast 03/21/2018 FINDINGS: Brain: 6 mm acute nonhemorrhagic infarct is present within the posterior limb of the left internal capsule and lateral thalamus. No acute hemorrhage or mass lesion is present. No other acute infarct is present. T2 signal changes are associated with the area of restricted diffusion. Dilated perivascular spaces are present within the basal ganglia. Confluent periventricular and subcortical T2 hyperintensities are noted bilaterally. These are moderately advanced for age. White matter changes extend into the brainstem. The brainstem and cerebellum are otherwise unremarkable. 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 Postcontrast images demonstrate a 9 mm focus enhancement along the inferior aspect of the left frontal operculum. This is along the cortex without a definite mass lesion. No other foci of enhancement are present. Vascular: Flow is present in the major intracranial arteries. Skull and upper cervical spine: Craniocervical junction is within normal limits. Upper cervical spine is normal.  Sinuses/Orbits: Mild mucosal thickening is present inferiorly in the left maxillary sinus. The paranasal sinuses and mastoid air cells are  otherwise clear. There is some fluid within the nasopharynx. Globes and orbits are within normal limits. IMPRESSION: 1. 6 mm acute nonhemorrhagic infarct along the posterior limb of the right internal capsule or lateral thalamus correlates with right-sided weakness. 2. Focal subcortical enhancement the left frontal operculum. This may represent a early chronic infarct. There is no definite mass lesion. Recommend follow-up MRI without and with contrast in 1-2 months. 3. Atrophy and white matter disease are advanced for age. This likely reflects the sequela of chronic microvascular ischemia. Electronically Signed   By: San Morelle M.D.   On: 03/21/2018 16:50    DISCHARGE EXAMINATION: See progress note from earlier today.  DISPOSITION: Home  Discharge Instructions    Ambulatory referral to Neurology   Complete by:  As directed    An appointment is requested in approximately: 4 weeks   Call MD for:  difficulty breathing, headache or visual disturbances   Complete by:  As directed    Call MD for:  extreme fatigue   Complete by:  As directed    Call MD for:  persistant dizziness or light-headedness   Complete by:  As directed    Call MD for:  persistant nausea and vomiting   Complete by:  As directed    Call MD for:  severe uncontrolled pain   Complete by:  As directed    Call MD for:  temperature >100.4   Complete by:  As directed    Diet - low sodium heart healthy   Complete by:  As directed    Discharge instructions   Complete by:  As directed    Please take your medications as prescribed. Have your PCP check your BP within 5-7 days and adjust the BP medications.  You were cared for by a hospitalist during your hospital stay. If you have any questions about your discharge medications or the care you received while you were in the hospital after  you are discharged, you can call the unit and asked to speak with the hospitalist on call if the hospitalist that took care of you is not available. Once you are discharged, your primary care physician will handle any further medical issues. Please note that NO REFILLS for any discharge medications will be authorized once you are discharged, as it is imperative that you return to your primary care physician (or establish a relationship with a primary care physician if you do not have one) for your aftercare needs so that they can reassess your need for medications and monitor your lab values. If you do not have a primary care physician, you can call [phone] for a physician referral.   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 Start taking on:  03/25/2018   aspirin 81 MG EC tablet Take 1 tablet (81 mg total)  by mouth daily for 21 days. For 3 weeks only Start taking on:  03/25/2018   atorvastatin 80 MG tablet Commonly known as:  LIPITOR Take 1 tablet (80 mg total) by mouth daily at 6 PM.   cloNIDine 0.1 MG tablet Commonly known as:  CATAPRES Take 1 tablet (0.1 mg total) by mouth 2 (two) times daily. What changed:  when to take this   clopidogrel 75 MG tablet Commonly known as:  PLAVIX Take 1 tablet (75 mg total) by mouth daily. Start taking on:  03/25/2018   CoQ-10 100 MG Caps Take 100 mg by mouth daily.   Fish Oil 600 MG Caps Take 600 mg by  mouth 3 (three) times daily.   furosemide 20 MG tablet Commonly known as:  LASIX Take 20 mg by mouth daily.   glucose blood test strip Commonly known as:  ACCU-CHEK SMARTVIEW Test up to TID dx:E11.40   METHYLCOBALAMIN PO Take 1 tablet by mouth daily.   MULTIVITAL-M Tabs Take 1 tablet by mouth daily.   potassium gluconate 595 (99 K) MG Tabs tablet Take 595 mg by mouth 3 (three) times daily.   Vitamin D-3 5000 units Tabs Take 5,000 Units by mouth daily.        Follow-up Information    Sherald Hess., MD. Schedule an appointment as soon as possible for a visit in 1 week(s).   Specialty:  Family Medicine Why:  BP check Contact information: [place] [address] [phone]        Rosetta Posner, MD Follow up.   Specialties:  Vascular Surgery, Cardiology Why:  his office will schedule appt Contact information: [address] [place] [address] [phone]           TOTAL DISCHARGE TIME: 63 mins  [facility] Pager [phone]  03/24/2018, 3:49 PM

## 2018-03-24 NOTE — Care Management Note (Signed)
Case Management Note  Patient Details  Name: Jade [name] MRN: [id number] Date of Birth: Jun 19, 1956  Subjective/Objective:     Pt admitted with CVA. She is from home with spouse.                Action/Plan: Awaiting PT/OT evals. CM following for d/c needs, physician orders.   Expected Discharge Date:                  Expected Discharge Plan:     In-House Referral:     Discharge planning Services     Post Acute Care Choice:    Choice offered to:     DME Arranged:    DME Agency:     HH Arranged:    HH Agency:     Status of Service:  In process, will continue to follow  If discussed at Long Length of Stay Meetings, dates discussed:    Additional Comments:  Kermit Balo, RN 03/24/2018, 11:24 AM

## 2018-03-24 NOTE — Evaluation (Signed)
Occupational Therapy Evaluation Patient Details Name: Jade [name] MRN: [id number] DOB: 27-Apr-1956 Today's Date: 03/24/2018    History of Present Illness Jade [name] Matamorosis an 62 y.o.femalePMH significant for HTN and DM (including neuropathy in feet) who presents to [facility] with complaints of dizziness, N/V/D, right sided numbness, blurred vision in L eye for about a month. 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    Clinical Impression   Patient presenting with decreased I in self care, balance, transfers, endurance, strength, and safety awareness. Patient reports being independent PTA. Patient currently functioning supervision overall without use of AD. Patient will benefit from acute OT to increase overall independence in the areas of ADLs, functional mobility, and safety awareness in order to safely discharge home with husband.     Follow Up Recommendations  No OT follow up;Supervision - Intermittent    Equipment Recommendations  None recommended by OT    Recommendations for Other Services Other (comment)(none needed)     Precautions / Restrictions Precautions Precautions: Fall Restrictions Weight Bearing Restrictions: No      Mobility Bed Mobility Overal bed mobility: Needs Assistance Bed Mobility: Supine to Sit     Supine to sit: Min guard     General bed mobility comments: seated in recliner chair upon entering the room  Transfers Overall transfer level: Needs assistance Equipment used: None Transfers: Sit to/from Stand Sit to Stand: Supervision         General transfer comment: for immediate standing balance    Balance Overall balance assessment: Mild deficits observed, not formally tested      ADL either performed or assessed with clinical judgement   ADL Overall ADL's : Needs assistance/impaired     Grooming: Supervision/safety   Upper Body Bathing: Supervision/  safety   Lower Body Bathing: Supervison/ safety   Upper Body Dressing : Supervision/safety   Lower Body Dressing: Supervision/safety   Toilet Transfer: Supervision/safety   Toileting- Clothing Manipulation and Hygiene: Supervision/safety General ADL Comments: supervision overall without use of AD for self care tasks     Vision Baseline Vision/History: Wears glasses Wears Glasses: Reading only Patient Visual Report: No change from baseline              Pertinent Vitals/Pain Pain Assessment: No/denies pain     Hand Dominance Right   Extremity/Trunk Assessment Upper Extremity Assessment Upper Extremity Assessment: Overall WFL for tasks assessed   Lower Extremity Assessment Lower Extremity Assessment: Generalized weakness   Cervical / Trunk Assessment Cervical / Trunk Assessment: Kyphotic   Communication Communication Communication: No difficulties   Cognition Arousal/Alertness: Awake/alert Behavior During Therapy: WFL for tasks assessed/performed Overall Cognitive Status: Within Functional Limits for tasks assessed       General Comments  BP prior to mobility: 172/74             Home Living Family/patient expects to be discharged to:: Private residence Living Arrangements: Spouse/significant other Available Help at Discharge: Family;Available 24 hours/day Type of Home: House Home Access: Stairs to enter [employer] of Steps: 1 Entrance Stairs-Rails: None Home Layout: One level     Bathroom Shower/Tub: Chief Strategy Officer: Handicapped height     Home Equipment: Grab bars - toilet;Grab bars - tub/shower;Cane - single point          Prior Functioning/Environment Level of Independence: Independent        Comments: husband drives due to neuropathy  OT Problem List: Impaired balance (sitting and/or standing);Decreased safety awareness;Decreased activity tolerance      OT Treatment/Interventions: Self-care/ADL  training;Therapeutic exercise;Patient/family education;Neuromuscular education;Balance training;Energy conservation;Therapeutic activities;DME and/or AE instruction    OT Goals(Current goals can be found in the care plan section) Acute Rehab OT Goals Patient Stated Goal: regain strength and go home OT Goal Formulation: With patient Time For Goal Achievement: 04/07/18 Potential to Achieve Goals: Good ADL Goals Pt Will Perform Upper Body Bathing: with modified independence Pt Will Perform Lower Body Bathing: with modified independence Pt Will Perform Upper Body Dressing: with modified independence Pt Will Perform Lower Body Dressing: with modified independence Pt Will Transfer to Toilet: with modified independence Pt Will Perform Toileting - Clothing Manipulation and hygiene: with modified independence Pt Will Perform Tub/Shower Transfer: with modified independence  OT Frequency: Min 2X/week   Barriers to D/C: Other (comment)(none known at this time)             AM-PAC PT "6 Clicks" Daily Activity     Outcome Measure Help from another person eating meals?: None Help from another person taking care of personal grooming?: None Help from another person toileting, which includes using toliet, bedpan, or urinal?: A Little Help from another person bathing (including washing, rinsing, drying)?: A Little Help from another person to put on and taking off regular upper body clothing?: None Help from another person to put on and taking off regular lower body clothing?: A Little 6 Click Score: 21   End of Session    Activity Tolerance: Patient tolerated treatment well Patient left: in chair;with chair alarm set  OT Visit Diagnosis: Muscle weakness (generalized) (M62.81)                Time: 4034-7425 OT Time Calculation (min): 17 min Charges:  OT General Charges $OT Visit: 1 Visit OT Evaluation $OT Eval Low Complexity: 1 Low   Jade [name] P, MS, OTR/L 03/24/2018, 3:31 PM

## 2018-03-24 NOTE — Care Management Important Message (Signed)
Important Message  Patient Details  Name: Jade [name] MRN: [id number] Date of Birth: 1955/10/23   Medicare Important Message Given:  Yes    Shelli Portilla Stefan Church 03/24/2018, 2:56 PM

## 2018-03-24 NOTE — Discharge Instructions (Signed)
Carotid Artery Disease The carotid arteries are the two main arteries on either side of the neck that supply blood to the brain. Carotid artery disease, also called carotid artery stenosis, is the narrowing or blockage of one or both carotid arteries. Carotid artery disease increases your risk for a stroke or a transient ischemic attack (TIA). A TIA is an episode in which a waxy, fatty substance that accumulates within the artery (plaque) blocks blood flow to the brain. A TIA is considered a "warning stroke." What are the causes?  Buildup of plaque inside the carotid arteries (atherosclerosis) (common).  A weakened outpouching in an artery (aneurysm).  Inflammation of the carotid artery (arteritis).  A fibrous growth within the carotid artery (fibromuscular dysplasia).  Tissue death within the carotid artery due to radiation treatment (post-radiation necrosis).  Decreased blood flow due to spasms of the carotid artery (vasospasm).  Separation of the walls of the carotid artery (carotid dissection). What increases the risk?  High cholesterol (dyslipidemia).  High blood pressure (hypertension).  Smoking.  Obesity.  Diabetes.  Family history of cardiovascular disease.  Inactivity or lack of regular exercise.  Being female. Men have an increased risk of developing atherosclerosis earlier in life than women. What are the signs or symptoms? Carotid artery disease does not cause symptoms. How is this diagnosed? Diagnosis of carotid artery disease may include:  A physical exam. Your health care provider may hear an abnormal sound (bruit) when listening to the carotid arteries.  Specific tests that look at the blood flow in the carotid arteries. These tests include: ? Carotid artery ultrasonography. ? Carotid or cerebral angiography. ? Computerized tomographic angiography (CTA). ? Magnetic resonance angiography (MRA).  How is this treated? Treatment of carotid artery disease can  include a combination of treatments. Treatment options include:  Surgery. You may have: ? A carotid endarterectomy. This is a surgery to remove the blockages in the carotid arteries. ? A carotid angioplasty with stenting. This is a nonsurgical interventional procedure. A wire mesh (stent) is used to widen the blocked carotid arteries.  Medicines to control blood pressure, cholesterol, and reduce blood clotting (antiplatelet therapy).  Adjusting your diet.  Lifestyle changes such as: ? Quitting smoking. ? Exercising as tolerated or as directed by your health care provider. ? Controlling and maintaining a good blood pressure. ? Keeping cholesterol levels under control.  Follow these instructions at home:  Take medicines only as directed by your health care provider. Make sure you understand all your medicine instructions. Do not stop your medicines without talking to your health care provider.  Follow your health care provider's diet instructions. It is important to eat a healthy diet that is low in saturated fats and includes plenty of fresh fruits, vegetables, and lean meats. High-fat, high-sodium foods as well as foods that are fried, overly processed, or have poor nutritional value should be avoided.  Maintain a healthy weight.  Stay physically active. It is recommended that you get at least 30 minutes of activity every day.  Do not use any tobacco products including cigarettes, chewing tobacco, or electronic cigarettes. If you need help quitting, ask your health care provider.  Limit alcohol use to: ? No more than 2 drinks per day for men. ? No more than 1 drink per day for nonpregnant women.  Do not use illegal drugs.  Keep all follow-up visits as directed by your health care provider. Get help right away if: You develop TIA or stroke symptoms. These include:  Sudden   weakness or numbness on one side of the body, such as in the face, arm, or leg.  Sudden  confusion.  Trouble speaking (aphasia) or understanding.  Sudden trouble seeing out of one or both eyes.  Sudden trouble walking.  Dizziness or feeling like you might faint.  Loss of balance or coordination.  Sudden severe headache with no known cause.  Sudden trouble swallowing (dysphagia).  If you have any of these symptoms, call your local emergency services (911 in [place] Do not drive yourself to the clinic or hospital. This is a medical emergency. This information is not intended to replace advice given to you by your health care provider. Make sure you discuss any questions you have with your health care provider. Document Released: 11/05/2011 Document Revised: 01/19/2016 Document Reviewed: 02/11/2013 Elsevier Interactive Patient Education  2017 [employer]  

## 2018-03-24 NOTE — Evaluation (Signed)
Physical Therapy Evaluation Patient Details Name: Jade [name] MRN: [id number] DOB: Jan 07, 1956 Today's Date: 03/24/2018   History of Present Illness  Jade [name] Matamorosis an 62 y.o.femalePMH significant for HTN and DM (including neuropathy in feet) who presents to [facility] with complaints of dizziness, N/V/D, right sided numbness, blurred vision in L eye for about a month. 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     Clinical Impression  [name] is a very pleasant 62 y/o female admitted with the above listed diagnosis. Patient reports that prior to admission she was independent with mobility without AD. Patient today with required min guard/supervision for mobility for general safety, but without LOB or need for physical assist for steadying. PT currently recommending Outpatient PT at discharge to continue to progress strength and high level balance. PT to follow acutely to maximize safe and independent functional mobility prior to d/c home.     Follow Up Recommendations Outpatient PT;Supervision - Intermittent    Equipment Recommendations  None recommended by PT    Recommendations for Other Services       Precautions / Restrictions Precautions Precautions: Fall Restrictions Weight Bearing Restrictions: No      Mobility  Bed Mobility Overal bed mobility: Needs Assistance Bed Mobility: Supine to Sit     Supine to sit: Min guard     General bed mobility comments: for safety; patient pulling up on PT to EOB  Transfers Overall transfer level: Needs assistance Equipment used: None Transfers: Sit to/from Stand Sit to Stand: Min guard         General transfer comment: for immediate standing balance  Ambulation/Gait Ambulation/Gait assistance: Min guard Gait Distance (Feet): 150 Feet Assistive device: None Gait Pattern/deviations: Step-through pattern;Decreased stride length Gait  velocity: decreased   General Gait Details: slow steady pace; pateint reports weakness at B LE due to being in bed; no LOB  Stairs            Wheelchair Mobility    Modified Rankin (Stroke Patients Only) Modified Rankin (Stroke Patients Only) Pre-Morbid Rankin Score: No significant disability Modified Rankin: Moderately severe disability     Balance Overall balance assessment: Mild deficits observed, not formally tested                                           Pertinent Vitals/Pain Pain Assessment: No/denies pain    Home Living Family/patient expects to be discharged to:: Private residence Living Arrangements: Spouse/significant other Available Help at Discharge: Family;Available 24 hours/day Type of Home: House Home Access: Stairs to enter Entrance Stairs-Rails: None Entrance Stairs-Number of Steps: 1 Home Layout: One level(1 step into kitchen) Home Equipment: Grab bars - toilet;Grab bars - tub/shower;Cane - single point      Prior Function Level of Independence: Independent         Comments: husband drives due to neuropathy     Hand Dominance        Extremity/Trunk Assessment   Upper Extremity Assessment Upper Extremity Assessment: Defer to OT evaluation    Lower Extremity Assessment Lower Extremity Assessment: Generalized weakness    Cervical / Trunk Assessment Cervical / Trunk Assessment: Kyphotic  Communication   Communication: No difficulties  Cognition Arousal/Alertness: Awake/alert Behavior During Therapy: WFL for tasks assessed/performed Overall Cognitive Status: Within Functional Limits for tasks assessed  General Comments General comments (skin integrity, edema, etc.): BP prior to mobility: 172/74     Exercises     Assessment/Plan    PT Assessment Patient needs continued PT services  PT Problem List Decreased strength;Decreased activity  tolerance;Decreased balance;Decreased mobility;Decreased coordination;Decreased knowledge of use of DME;Decreased safety awareness       PT Treatment Interventions DME instruction;Gait training;Stair training;Functional mobility training;Therapeutic activities;Therapeutic exercise;Balance training;Neuromuscular re-education;Patient/family education    PT Goals (Current goals can be found in the Care Plan section)  Acute Rehab PT Goals Patient Stated Goal: regain strength PT Goal Formulation: With patient Time For Goal Achievement: 03/31/18 Potential to Achieve Goals: Good    Frequency Min 4X/week   Barriers to discharge        Co-evaluation               AM-PAC PT "6 Clicks" Daily Activity  Outcome Measure Difficulty turning over in bed (including adjusting bedclothes, sheets and blankets)?: A Little Difficulty moving from lying on back to sitting on the side of the bed? : Unable Difficulty sitting down on and standing up from a chair with arms (e.g., wheelchair, bedside commode, etc,.)?: Unable Help needed moving to and from a bed to chair (including a wheelchair)?: A Little Help needed walking in hospital room?: A Little Help needed climbing 3-5 steps with a railing? : A Little 6 Click Score: 14    End of Session Equipment Utilized During Treatment: Gait belt Activity Tolerance: Patient tolerated treatment well Patient left: in chair;with call bell/phone within reach;with chair alarm set Nurse Communication: Mobility status PT Visit Diagnosis: Unsteadiness on feet (R26.81);Other abnormalities of gait and mobility (R26.89);Muscle weakness (generalized) (M62.81)    Time: 1610-9604 PT Time Calculation (min) (ACUTE ONLY): 26 min   Charges:   PT Evaluation $PT Eval Moderate Complexity: 1 Mod PT Treatments $Gait Training: 8-22 mins        Kipp Laurence, PT, DPT 03/24/18 12:29 PM Pager: [phone]

## 2018-03-24 NOTE — Consult Note (Addendum)
[facility] NOTE   MRN : [id number]  Reason for Consult: carotid stenosis s/p acute stroke Referring Physician: Neurology  History of Present Illness: 62 y/o female admitted secondary to acute stroke.  She states she developed sudden right sided numbness of her face, right UE, and right LE.   She developed  dizziness, N/V/D this past Saturday.  She has had blurred vision for about 1 month.  She denise weakness and amaurosis.  She reported a similar episode in Nov. 2018 with dizziness and N/V that was not further worked up and her symptoms resolved quickly.    Past medical history includes: HTN, DM with neuropathy. She is diet controlled with her DM and has not taken PO medication in 6+ years.       Current Facility-Administered Medications  Medication Dose Route Frequency Provider Last Rate Last Dose  . acetaminophen (TYLENOL) tablet 650 mg  650 mg Oral Q4H PRN Wouk, Wilfred Curtis, MD       Or  . acetaminophen (TYLENOL) solution 650 mg  650 mg Per Tube Q4H PRN Wouk, Wilfred Curtis, MD       Or  . acetaminophen (TYLENOL) suppository 650 mg  650 mg Rectal Q4H PRN Wouk, Wilfred Curtis, MD      . amLODipine (NORVASC) tablet 5 mg  5 mg Oral Daily Rinehuls, David L, PA-C   5 mg at 03/24/18 1043  . aspirin EC tablet 81 mg  81 mg Oral Daily Kathrynn Running, MD   81 mg at 03/24/18 1043  . atorvastatin (LIPITOR) tablet 80 mg  80 mg Oral q1800 Kathrynn Running, MD   80 mg at 03/23/18 1703  . cloNIDine (CATAPRES) tablet 0.1 mg  0.1 mg Oral BID Osvaldo Shipper, MD      . clopidogrel (PLAVIX) tablet 75 mg  75 mg Oral Daily Marvel Plan, MD   75 mg at 03/24/18 1043  . heparin injection 5,000 Units  5,000 Units Subcutaneous Q8H Kathrynn Running, MD   5,000 Units at 03/24/18 0530  . hydrALAZINE (APRESOLINE) injection 5 mg  5 mg Intravenous Q6H PRN Rinehuls, David L, PA-C   5 mg at 03/24/18 0042  . senna-docusate (Senokot-S) tablet 1 tablet  1 tablet Oral QHS PRN Wouk,  Wilfred Curtis, MD        Pt meds include: Statin :Yes Betablocker: No ASA: Yes Other anticoagulants/antiplatelets: None  Past Medical History:  Diagnosis Date  . Arthritis    "back, knees, some in my left hip" (03/21/2018)  . Chronic kidney disease    "was told I had 25% kidney function in [place] 2012" (03/21/2018)  . COPD (chronic obstructive pulmonary disease) (HCC)   . CVA (cerebral vascular accident) (HCC) 03/21/2018   "numb all over; head to toe" (03/21/2018)  . Fibromyalgia   . History of gout   . Hyperlipidemia   . Hypertension   . Migraine    "used to get them monthly during menopause" (03/21/2018)  . Neuromuscular disorder (HCC)   . OSA (obstructive sleep apnea)    "don't tolerate the machine" (03/21/2018)  . Pneumonia ~ 2007  . Type 2 diabetes, diet controlled (HCC)     Past Surgical History:  Procedure Laterality Date  . HERNIA REPAIR    . LAPAROSCOPIC CHOLECYSTECTOMY  06/2007   "no UHR w/this" (03/21/2018)  . TONSILLECTOMY    . UMBILICAL HERNIA REPAIR  2009    Social History Social History   Tobacco Use  . Smoking  status: Former Smoker    Packs/day: 1.25    Years: 16.00    Pack years: 20.00    Types: Cigarettes    Last attempt to quit: 08/28/1987    Years since quitting: 30.5  . Smokeless tobacco: Never Used  Substance Use Topics  . Alcohol use: Not Currently  . Drug use: Not Currently    Types: Marijuana    Family History Family History  Problem Relation Age of Onset  . Heart disease Father   . Leukemia Father   . Alcohol abuse Son   . Other Mother   . Cancer Neg Hx   . Diabetes Neg Hx   . Hearing loss Neg Hx   . Hyperlipidemia Neg Hx   . Hypertension Neg Hx   . Kidney disease Neg Hx   . Stroke Neg Hx     Allergies  Allergen Reactions  . Nickel Dermatitis and Rash     REVIEW OF SYSTEMS  General: [ ]  Weight loss, [ ]  Fever, [ ]  chills Neurologic: [ ]  Dizziness, [ ]  Blackouts, [ ]  Seizure [ ]  Stroke, [ ]  "Mini stroke", [ ]  Slurred  speech, [ ]  Temporary blindness; [ ]  weakness in arms or legs, [ ]  Hoarseness [ ]  Dysphagia Cardiac: [ ]  Chest pain/pressure, [ ]  Shortness of breath at rest [ ]  Shortness of breath with exertion, [ ]  Atrial fibrillation or irregular heartbeat  Vascular: [ ]  Pain in legs with walking, [ ]  Pain in legs at rest, [ ]  Pain in legs at night,  [ ]  Non-healing ulcer, [ ]  Blood clot in vein/DVT,   Pulmonary: [ ]  Home oxygen, [ ]  Productive cough, [ ]  Coughing up blood, [ ]  Asthma,  [ ]  Wheezing [ ]  COPD Musculoskeletal:  [ ]  Arthritis, [ ]  Low back pain, [ ]  Joint pain Hematologic: [ ]  Easy Bruising, [ ]  Anemia; [ ]  Hepatitis Gastrointestinal: [ ]  Blood in stool, [ ]  Gastroesophageal Reflux/heartburn, Urinary: [ ]  chronic Kidney disease, [ ]  on HD - [ ]  MWF or [ ]  TTHS, [ ]  Burning with urination, [ ]  Difficulty urinating Skin: [ ]  Rashes, [ ]  Wounds Psychological: [ ]  Anxiety, [ ]  Depression  Physical Examination Vitals:   03/24/18 0034 03/24/18 0100 03/24/18 0903 03/24/18 1150  BP: (!) 191/65 (!) 171/74 (!) 165/69 (!) 183/81  Pulse: 71 74 71 81  Resp:   20 20  Temp:   97.6 F (36.4 C) 97.9 F (36.6 C)  TempSrc:   Oral Oral  SpO2:   98% 99%  Weight:      Height:       Body mass index is 32.78 kg/m.  General:  WDWN in NAD Gait: Normal HENT: WNL, normocephalic Eyes: Pupils equal Pulmonary: normal non-labored breathing , without Rales, rhonchi,  wheezing Cardiac: RRR, without  Murmurs, rubs or gallops; No carotid bruits Abdomen: soft, NT, no masses, + BS Skin: no rashes, ulcers noted;  no Gangrene , no cellulitis; no open wounds;   Vascular Exam/Pulses:palpable radial, femoral, DP/PT pulses B   Musculoskeletal: no muscle wasting or atrophy; no edema  Neurologic: A&O X 3; Appropriate Affect ;  SENSATION: normal; no loss of sensation to lite touch MOTOR FUNCTION: 5/5 Symmetric B UE/LE Speech is fluent/normal   Significant Diagnostic Studies: CBC Lab Results  Component Value  Date   WBC 8.8 03/23/2018   HGB 13.9 03/23/2018   HCT 42.6 03/23/2018   MCV 95.3 03/23/2018   PLT 283 03/23/2018  BMET    Component Value Date/Time   NA 142 03/23/2018 0622   K 3.3 (L) 03/23/2018 0622   CL 110 03/23/2018 0622   CO2 23 03/23/2018 0622   GLUCOSE 106 (H) 03/24/2018 0655   BUN 8 03/23/2018 0622   CREATININE 0.86 03/23/2018 0622   CALCIUM 9.6 03/23/2018 0622   GFRNONAA >60 03/23/2018 0622   GFRAA >60 03/23/2018 0622   Estimated Creatinine Clearance: 66.2 mL/min (by C-G formula based on SCr of 0.86 mg/dL).  COAG Lab Results  Component Value Date   INR 0.90 03/21/2018     Non-Invasive Vascular Imaging:   Carotid duplex 2 weeks ago  B ICA < 39%  Left  6 mm acute nonhemorrhagic infarct along the posterior limb brain stroke   CTA Neck RIGHT CAROTID SYSTEM: Common carotid artery is patent. Less than 50% stenosis RIGHT internal carotid artery due to atherosclerosis, by NASCET criteria. Patent internal carotid artery.  LEFT CAROTID SYSTEM: Common carotid artery is patent. Calcific atherosclerosis resulting in 4 mm segment 80 stenosis internal carotid artery origin by NASCET criteria. Patent internal carotid artery  VERTEBRAL ARTERIES:Left vertebral artery is dominant. Normal appearance of the vertebral arteries, widely patent.  ASSESSMENT/PLAN:  Left sided non hemoragic stroke Right side Numbness facial, UE/LE without weakness weakness. Left ICA stenosis 80 % reviewed on CTA of the neck.     Based on the review of the CTADr. Sharnay Cashion is recommending that she will need left CEA in 1-2 weeks to prevent future stroke risk.  She can have this scheduled as an out patient.     She will continue Plavix daily, Aspirin, and Lipitor which was started this visit.     Mosetta Pigeon 03/24/2018 2:19 PM   I have examined the patient, reviewed and agree with above.  Very nice 62 year old admitted with left brain event.  She had numbness in her right arm  and right face.  She denies any lower extremity symptoms.  Does have past history of neuropathy which is unchanged.  Reviewed her MRI scan.  This does show small left nonhemorrhagic infarct along the posterior limb of the left internal capsule and lateral thalamus.  There was a discrepancy in the body of the report and the findings on the official report from the MRI and I have pointed this out to the radiology department for correction and addendum.  Interestingly the patient had a carotid duplex at an outlying lab showing no evidence of carotid stenosis 1 week ago.  I did review her films from her CT angiogram of her neck.  This does show a high-grade greater than 80% internal carotid artery stenosis at the bifurcation with normal carotid above this.  She does have moderate tortuosity of her carotid artery.  She does have calcification but no flow-limiting stenosis in her right carotid bifurcation.  I had a long discussion with the patient recommending endarterectomy for reduction of recurrent stroke.  She to care for her grandmother who suffered a major stroke and was debilitated for 5 years prior to her passing.  I have described the procedure including approximately 2% risk of stroke and a very slight risk of cranial nerve injury, bleeding or infection.  I spoke with Dr.Krishnan.  I feel that she is safe to be discharged from a vascular surgery standpoint.  We will plan readmission next week for endarterectomy.  She will be discharged on aspirin  Gretta Began, MD 03/24/2018 4:05 PM

## 2018-03-25 ENCOUNTER — Telehealth: Payer: Self-pay | Admitting: *Deleted

## 2018-03-25 NOTE — Telephone Encounter (Signed)
Phone call to patient instructed to be at [facility] admitting department at 8:30 am on 03/31/18 for surgery. NPO past MN night prior. Expect a call and follow the detailed instructions received from the hospital pre-admission department about this surgery. Continue ASA and Plavix. Plan to stay overnight. Verbalized understanding.

## 2018-03-27 NOTE — Progress Notes (Signed)
Called pt for pre-op call. Pt states she had just returned home from her PCP's office and after a long discussion, they feel pt needs to cancel this surgery due to it being too soon. I told her that she needs to call Dr. Bosie Helper office in the AM and ask to speak with Kriste Basque, RN. I gave pt the office number.

## 2018-03-28 ENCOUNTER — Telehealth: Payer: Self-pay | Admitting: *Deleted

## 2018-03-28 NOTE — Telephone Encounter (Signed)
Patient called to cancel surgery. States she had a lengthy discussion she has decided to cancel surgery for 03/31/18. She is going to be in close contact with her PCP DR. ROBERT FOSTER  On her overall health. I made it clear to her that she and or her PCP to contact us if she decides she wants/needs to be seen by doctor in this office.

## 2018-03-31 ENCOUNTER — Inpatient Hospital Stay (HOSPITAL_COMMUNITY): Admission: RE | Admit: 2018-03-31 | Payer: Medicare HMO | Source: Ambulatory Visit | Admitting: Vascular Surgery

## 2018-03-31 ENCOUNTER — Encounter (HOSPITAL_COMMUNITY): Admission: RE | Payer: Self-pay | Source: Ambulatory Visit

## 2018-03-31 SURGERY — ENDARTERECTOMY, CAROTID
Anesthesia: General | Laterality: Left

## 2018-04-11 ENCOUNTER — Ambulatory Visit: Payer: Medicare HMO | Admitting: Physical Therapy

## 2018-04-25 ENCOUNTER — Ambulatory Visit: Payer: Self-pay | Admitting: Adult Health

## 2018-05-08 ENCOUNTER — Other Ambulatory Visit: Payer: Self-pay

## 2018-05-08 ENCOUNTER — Ambulatory Visit: Payer: Medicare HMO | Attending: Family Medicine

## 2018-05-08 VITALS — BP 132/72 | HR 59

## 2018-05-08 DIAGNOSIS — Z9181 History of falling: Secondary | ICD-10-CM

## 2018-05-08 DIAGNOSIS — M6281 Muscle weakness (generalized): Secondary | ICD-10-CM | POA: Diagnosis present

## 2018-05-08 DIAGNOSIS — R42 Dizziness and giddiness: Secondary | ICD-10-CM | POA: Diagnosis present

## 2018-05-08 DIAGNOSIS — R2681 Unsteadiness on feet: Secondary | ICD-10-CM | POA: Insufficient documentation

## 2018-05-08 DIAGNOSIS — R2689 Other abnormalities of gait and mobility: Secondary | ICD-10-CM | POA: Insufficient documentation

## 2018-05-09 NOTE — Therapy (Addendum)
[facility] [address] [place], [place], [address] Phone: [phone]   Fax:  [phone]  Physical Therapy Evaluation  Patient Details  Name: Jade [name] MRN: [id number] Date of Birth: [birth year] Referring Provider: Osvaldo Shipper, MD   Encounter Date: 05/08/2018  PT End of Session - 05/08/18 1656    Visit Number  1    Number of Visits  8    Date for PT Re-Evaluation  06/07/18    Authorization Type  [employer]- progress notes every 10 visits     PT Start Time  1533    PT Stop Time  1626    PT Time Calculation (min)  53 min    Equipment Utilized During Treatment  Gait belt    Activity Tolerance  Patient tolerated treatment well    Behavior During Therapy  [facility] for tasks assessed/performed       Past Medical History:  Diagnosis Date  . Arthritis    "back, knees, some in my left hip" (03/21/2018)  . Chronic kidney disease    "was told I had 25% kidney function in early 2012" (03/21/2018)  . COPD (chronic obstructive pulmonary disease) (HCC)   . CVA (cerebral vascular accident) (HCC) 03/21/2018   "numb all over; head to toe" (03/21/2018)  . Fibromyalgia   . History of gout   . Hyperlipidemia   . Hypertension   . Migraine    "used to get them monthly during menopause" (03/21/2018)  . Neuromuscular disorder (HCC)   . OSA (obstructive sleep apnea)    "don't tolerate the machine" (03/21/2018)  . Pneumonia ~ 2007  . Type 2 diabetes, diet controlled (HCC)     Past Surgical History:  Procedure Laterality Date  . HERNIA REPAIR    . LAPAROSCOPIC CHOLECYSTECTOMY  06/2007   "no UHR w/this" (03/21/2018)  . TONSILLECTOMY    . UMBILICAL HERNIA REPAIR  2009    Vitals:   05/08/18 1635  BP: 132/72  Pulse: (!) 59     Subjective Assessment - 05/08/18 1539    Subjective  Pt reports experiencing stroke end of July with numbness in R side of face, UE, & LE's. Pt currently reports residual numbness to R  hand with no LE weakness indicated. Pt reports diminshed sensation is from diabetic neurapthy and did not originate from prior stroke. Pt indicates poor grip strength with R UE and her handwriting has worsened. Pt reports poor sensation in B LE's greater in plantar surface of B feet and does not drive unless necessary. Pt reports blurred vision in L eye and went to see her doctor 2 weeks ago for eye exam and indicates diabetic retinapathy. Yesterday she saw Dr. Allena Katz, with patient reporting she wants to try natural supplements to get edema down and not wanting to do injections at this time. Pt reports she has a follow up appt. with Dr. Allena Katz on October 9th. Pt reports experiencing several falls a couple years prior from tripping over her feet or handling objects when walking. Pt reports chronic dizziness which she attributes to her medication. She reports dizziness worsens during transitional movements and describes as feeling "woozy".      Patient is accompained by:  Family member    Pertinent History  Stroke, Type II DM, morid obesity, HTN, COPD, CKD    Limitations  Other (comment)   High level balance   Patient Stated Goals  Being able to bend down to garden safely without sensation of falling  forwards    Currently in Pain?  No/denies         [facility] PT Assessment - 05/08/18 1546      Assessment   Medical Diagnosis  CVA    Referring Provider  Osvaldo Shipper, MD    Onset Date/Surgical Date  03/24/18    Hand Dominance  Right    Prior Therapy  Pt reports receiving acute care PT s/p stroke      Precautions   Precautions  Fall      Restrictions   Weight Bearing Restrictions  No      Balance Screen   Has the patient fallen in the past 6 months  No    Has the patient had a decrease in activity level because of a fear of falling?   No    Is the patient reluctant to leave their home because of a fear of falling?   No      Home Public house manager residence    Living  Arrangements  Spouse/significant other    Available Help at Discharge  Family    Type of Home  House    Home Access  Stairs to enter    Entrance Stairs-Number of Steps  1 step to enter    Entrance Stairs-Rails  None    Home Layout  One level    Home Equipment  Grab bars - tub/shower;Grab bars - toilet;Cane - single point      Prior Function   Level of Independence  Independent    Leisure  enjoys gardening but has difficulty bending over       Cognition   Overall Cognitive Status  Within Functional Limits for tasks assessed      Observation/Other Assessments   Skin Integrity  Therapist notes erythema to B LE's from lower leg progressing distally      Observation/Other Assessments-Edema    Edema  --   Bilateral ankles L > R but not formally measured     Sensation   Light Touch  Impaired Detail   Reports "different sensation" LE's distally > proximally    Light Touch Impaired Details  Impaired RLE;Impaired LLE   Refer to additional comments   Additional Comments  Pt sensation to light touch is impaired with reports that it feels "different" distally> proximally with diminshed sensation greatest to plantar surface of B LE's   UE sensation intact     Coordination   Gross Motor Movements are Fluid and Coordinated  Yes    Fine Motor Movements are Fluid and Coordinated  Yes    Finger Nose Finger Test  WNL    Heel Shin Test  WNL   L LE limited 2/2 to hip external rotation tightness      Posture/Postural Control   Posture/Postural Control  Postural limitations    Postural Limitations  Rounded Shoulders;Forward head      ROM / Strength   AROM / PROM / Strength  Strength;AROM      AROM   Overall AROM   Within functional limits for tasks performed      Strength   Overall Strength  Within functional limits for tasks performed    Overall Strength Comments  Overall 4/5 with hip flexion bilaterally 3/5; Hip add/ abd grossly assessed in sitting to be 4/5 but will formally assess  strength next session. Bilateral hip extension not formally tested but weakness suspected based on gait deviations. Therapist grossly assessed grip strength to be WNL  Transfers   Transfers  Sit to Stand;Stand to Sit    Sit to Stand  6: Modified independent (Device/Increase time);Without upper extremity assist    Stand to Sit  6: Modified independent (Device/Increase time);Without upper extremity assist      Ambulation/Gait   Ambulation/Gait  Yes    Ambulation/Gait Assistance  5: Supervision   supervision for safety considerations   Ambulation Distance (Feet)  40 Feet    Assistive device  None    Gait Pattern  Step-through pattern;Decreased arm swing - right;Decreased arm swing - left;Decreased dorsiflexion - left;Decreased dorsiflexion - right;Decreased trunk rotation;Wide base of support   genu varus   Ambulation Surface  Level;Indoor    Gait velocity  3.28 ft/sec   indicative of community ambulator   Stairs  Yes    Stairs Assistance  5: Supervision    Stair Management Technique  No rails;Alternating pattern    Number of Stairs  4    Height of Stairs  6      Standardized Balance Assessment   Standardized Balance Assessment  Berg Balance Test;Dynamic Gait Index      Berg Balance Test   Sit to Stand  Able to stand without using hands and stabilize independently    Standing Unsupported  Able to stand safely 2 minutes    Sitting with Back Unsupported but Feet Supported on Floor or Stool  Able to sit safely and securely 2 minutes    Stand to Sit  Sits safely with minimal use of hands    Transfers  Able to transfer safely, minor use of hands    Standing Unsupported with Eyes Closed  Able to stand 10 seconds safely    Standing Ubsupported with Feet Together  Able to place feet together independently and stand 1 minute safely    From Standing, Reach Forward with Outstretched Arm  Can reach confidently >25 cm (10")    From Standing Position, Pick up Object from Floor  Able to pick up  shoe safely and easily    From Standing Position, Turn to Look Behind Over each Shoulder  Looks behind one side only/other side shows less weight shift    Turn 360 Degrees  Able to turn 360 degrees safely in 4 seconds or less    Standing Unsupported, Alternately Place Feet on Step/Stool  Able to stand independently and safely and complete 8 steps in 20 seconds    Standing Unsupported, One Foot in Front  Needs help to step but can hold 15 seconds    Standing on One Leg  Able to lift leg independently and hold equal to or more than 3 seconds    Total Score  50    Berg comment:  Indicative of moderate falls risk      Dynamic Gait Index   Level Surface  Normal    Change in Gait Speed  Normal    Gait with Horizontal Head Turns  Normal    Gait with Vertical Head Turns  Mild Impairment    Gait and Pivot Turn  Normal    Step Over Obstacle  Normal    Step Around Obstacles  Normal    Steps  Normal    Total Score  23    DGI comment:  23/24: indicates pt is not at risk for falls.      Functional Gait  Assessment   Gait assessed   Yes    Gait Level Surface  Walks 20 ft in less than 7 sec  but greater than 5.5 sec, uses assistive device, slower speed, mild gait deviations, or deviates 6-10 in outside of the 12 in walkway width.    Change in Gait Speed  Able to change speed, demonstrates mild gait deviations, deviates 6-10 in outside of the 12 in walkway width, or no gait deviations, unable to achieve a major change in velocity, or uses a change in velocity, or uses an assistive device.    Gait with Horizontal Head Turns  Performs head turns smoothly with no change in gait. Deviates no more than 6 in outside 12 in walkway width    Gait with Vertical Head Turns  Performs task with slight change in gait velocity (eg, minor disruption to smooth gait path), deviates 6 - 10 in outside 12 in walkway width or uses assistive device    Gait and Pivot Turn  Pivot turns safely within 3 sec and stops quickly with no  loss of balance.    Step Over Obstacle  Is able to step over one shoe box (4.5 in total height) without changing gait speed. No evidence of imbalance.    Gait with Narrow Base of Support  Ambulates less than 4 steps heel to toe or cannot perform without assistance.    Gait with Eyes Closed  Walks 20 ft, uses assistive device, slower speed, mild gait deviations, deviates 6-10 in outside 12 in walkway width. Ambulates 20 ft in less than 9 sec but greater than 7 sec.    Ambulating Backwards  Walks 20 ft, slow speed, abnormal gait pattern, evidence for imbalance, deviates 10-15 in outside 12 in walkway width.    Steps  Alternating feet, no rail.    Total Score  20    FGA comment:  20/30: indicates pt is at a medium risk for falls                 Objective measurements completed on examination: See above findings.              PT Education - 05/08/18 1655    Education Details  Therapist provided education on clinical findings, plan of care, and future sessions focusing on high level dynamic balance.     Person(s) Educated  Patient    Methods  Explanation    Comprehension  Verbalized understanding       PT Short Term Goals - 05/08/18 1706      PT SHORT TERM GOAL #1   Title  STG's=LTG's        PT Long Term Goals - 05/08/18 1706      PT LONG TERM GOAL #1   Title  Pt demonstrates compliancy and independence with performing HEP in order to improve dynamic balance and LE strength     Time  4    Period  Weeks    Status  New    Target Date  06/07/18      PT LONG TERM GOAL #2   Title  Pt will improve Berg Balance score from 50/56 to >53/56 to decreased fall risk potential from moderate to low risk    Baseline  50/56 indicating moderate fall risk potential     Time  4    Period  Weeks    Status  New    Target Date  06/07/18      PT LONG TERM GOAL #3   Title  Pt will improve FGA score from 20/30 to <25/30 demonstrating improvement in dynamic functional mobility and  decrease fall  risk potential     Baseline  20/30 indicating medium fall risk potential     Time  4    Period  Weeks    Status  New    Target Date  06/07/18      PT LONG TERM GOAL #4   Title  Pt will improve gait speed from 3.28 ft/sec to 4.10 ft/sec indicating normal gait velocity with significant improvement     Baseline  3.28 ft/sec indicating community ambulator    Time  4    Period  Weeks    Status  New    Target Date  06/07/18      PT LONG TERM GOAL #5   Title  Pt will ambulate 1000 feet outdoors working on curbs, uneven surfaces, and changes in gait speed with mod I to improve safety with increased challenge during functional mobility     Baseline  Pt self reports she is able to walk 1 mile distances most commonly on level surfaces     Time  4    Period  Weeks    Status  New    Target Date  06/07/18      Additional Long Term Goals   Additional Long Term Goals  Yes      PT LONG TERM GOAL #6   Title  Pt will be able to perform x10 squats with incoporation of reaching outside BOS to simulate gardening at home with no reports of falling sensation and control with both lowering and rising to stand with mod I     Baseline  Pt reports increased difficulty performing squats to garden at home with sensation she will fall forwards and lose balance    Time  4    Period  Weeks    Status  New    Target Date  06/07/18             Plan - 05/08/18 1659    Clinical Impression Statement  Pt is a 62 year old female referred to Neuro OPPT for evaluation of strength, balance and functional mobility s/p stroke in 02/2018. Pt's PMH is significant for the following: Stroke, Type II DM, morbid obesity, HTN, COPD, and CKD. The following deficits were noted, decreased dynamic balance and LE strength with greatest weakness in bilateral hip musculature. During pt's exam, Pt's gait speed, 3.28 ft/sec is indicative of community ambulation. Pt's Berg score of 50/56 places her in the category of  moderate falls risk.  Pt's score of 20/30 on the FGA indicates pt is at medium risk for falls. Pt would benefit from skilled PT to address these impairments and functional limitations to maximize functional mobility independence and reduce falls risk.    History and Personal Factors relevant to plan of care:  unable to garden and participate in hobbies due to balance deficits with increased fear of falling    Clinical Presentation  Stable    Clinical Presentation due to:  Type II DM, morid obesity, HTN, COPD, CKD.     Clinical Decision Making  Low    Rehab Potential  Good    Clinical Impairments Affecting Rehab Potential  Impaired sensation 2/2 Type II DM    PT Frequency  2x / week    PT Duration  4 weeks    PT Treatment/Interventions  ADLs/Self Care Home Management;Gait training;Neuromuscular re-education;Functional mobility training;Stair training;Therapeutic activities;Patient/family education;Vestibular;Therapeutic exercise;Balance training;Visual/perceptual remediation/compensation    PT Next Visit Plan  Establish HEP, high level dynamic balance; going outdoors practicing  uneven surfaces, grass, curbs, ramps; LE strengthening with focus on hip musculature     PT Home Exercise Plan  tbd    Recommended Other Services  occupational therapy     Consulted and Agree with Plan of Care  Patient;Family member/caregiver    Family Member Consulted  husband Elita Quick        Patient will benefit from skilled therapeutic intervention in order to improve the following deficits and impairments:  Abnormal gait, Dizziness, Impaired sensation, Improper body mechanics, Decreased mobility, Decreased strength, Impaired vision/preception, Increased edema, Decreased balance, Decreased knowledge of use of DME, Impaired flexibility, Impaired UE functional use  Visit Diagnosis: Unsteadiness on feet  Muscle weakness (generalized)  Dizziness and giddiness  History of falling  Other abnormalities of gait and  mobility     Problem List Patient Active Problem List   Diagnosis Date Noted  . Acute CVA (cerebrovascular accident) (HCC) 03/21/2018  . Obesity, Class III, BMI 40-49.9 (morbid obesity) (HCC) 03/21/2018  . IBS (irritable bowel syndrome) 04/12/2015  . Hair loss 10/20/2014  . Primary osteoarthritis of left knee 09/29/2014  . Visit for screening mammogram 08/30/2014  . Routine general medical examination at a health care facility 08/30/2014  . Tinea corporis 12/21/2013  . Essential hypertension, benign 12/21/2013  . Low back pain 08/24/2013  . Patient noncompliant with statin medication 02/23/2013  . H/O abnormal Pap smear 10/24/2012  . Osteopenia 09/26/2012  . Diabetic neuropathy, painful (HCC) 05/26/2012  . 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  . Pure hypercholesterolemia 03/20/2012  . Chronic venous insufficiency 03/20/2012    Carney Living, SPT 05/09/2018, 9:11 AM  [facility] [address] [place], [place], [address] Phone: [phone]   Fax:  [phone]  Name: Jade [name] MRN: [id number] Date of Birth: 11/10/1955  Zerita Boers, PT,DPT 05/09/18 9:23 AM Phone: [phone] Fax: [phone]

## 2018-05-21 ENCOUNTER — Encounter: Payer: Self-pay | Admitting: Physical Therapy

## 2018-05-21 ENCOUNTER — Ambulatory Visit: Payer: Medicare HMO | Admitting: Physical Therapy

## 2018-05-21 DIAGNOSIS — Z9181 History of falling: Secondary | ICD-10-CM

## 2018-05-21 DIAGNOSIS — R2681 Unsteadiness on feet: Secondary | ICD-10-CM | POA: Diagnosis not present

## 2018-05-21 DIAGNOSIS — R42 Dizziness and giddiness: Secondary | ICD-10-CM

## 2018-05-21 DIAGNOSIS — M6281 Muscle weakness (generalized): Secondary | ICD-10-CM

## 2018-05-21 DIAGNOSIS — R2689 Other abnormalities of gait and mobility: Secondary | ICD-10-CM

## 2018-05-21 NOTE — Therapy (Signed)
[facility] [address] [place], [place], [address] Phone: [phone]   Fax:  [phone]  Physical Therapy Treatment  Patient Details  Name: Jade [name] MRN: [id number] Date of Birth: [birth year] Referring Provider: Osvaldo Shipper, MD   Encounter Date: 05/21/2018  PT End of Session - 05/21/18 1535    Visit Number  2    Number of Visits  8    Date for PT Re-Evaluation  06/07/18    Authorization Type  [employer] H[employer]- progress notes every 10 visits     PT Start Time  1533    PT Stop Time  1615    PT Time Calculation (min)  42 min    Activity Tolerance  Patient tolerated treatment well    Behavior During Therapy  [facility] for tasks assessed/performed       Past Medical History:  Diagnosis Date  . Arthritis    "back, knees, some in my left hip" (03/21/2018)  . Chronic kidney disease    "was told I had 25% kidney function in early 2012" (03/21/2018)  . COPD (chronic obstructive pulmonary disease) (HCC)   . CVA (cerebral vascular accident) (HCC) 03/21/2018   "numb all over; head to toe" (03/21/2018)  . Fibromyalgia   . History of gout   . Hyperlipidemia   . Hypertension   . Migraine    "used to get them monthly during menopause" (03/21/2018)  . Neuromuscular disorder (HCC)   . OSA (obstructive sleep apnea)    "don't tolerate the machine" (03/21/2018)  . Pneumonia ~ 2007  . Type 2 diabetes, diet controlled (HCC)     Past Surgical History:  Procedure Laterality Date  . HERNIA REPAIR    . LAPAROSCOPIC CHOLECYSTECTOMY  06/2007   "no UHR w/this" (03/21/2018)  . TONSILLECTOMY    . UMBILICAL HERNIA REPAIR  2009    There were no vitals filed for this visit.  Subjective Assessment - 05/21/18 1535    Subjective  needs to reduce frequency to 1x/week due to financial reasons; has been staying very active at home    Patient is accompained by:  Family member    Pertinent History  Stroke, Type II DM, morid  obesity, HTN, COPD, CKD    Patient Stated Goals  Being able to bend down to garden safely without sensation of falling forwards    Currently in Pain?  No/denies    Multiple Pain Sites  No                       [facility] Adult PT Treatment/Exercise - 05/21/18 0001      Exercises   Exercises  Knee/Hip      Knee/Hip Exercises: Standing   Functional Squat  10 reps    Functional Squat Limitations  VC/TC - use of mirror - poor posture    Wall Squat  10 reps    Wall Squat Limitations  much better form      Knee/Hip Exercises: Supine   Bridges  Both;10 reps    Straight Leg Raises  Both;10 reps          Balance Exercises - 05/21/18 1548      Balance Exercises: Standing   Tandem Stance  Eyes open;2 reps;30 secs   progress to head turn with 1/2 tandem   SLS  Eyes open;Solid surface;2 reps;20 secs    Tandem Gait  2 reps;Forward   20 feet   Retro Gait  2 reps  20 feet   Sit to Stand Time  from mat table x 12 reps - arms crossed at chest        PT Education - 05/21/18 1714    Education Details  HEP initiation and safety at home    Person(s) Educated  Patient    Methods  Explanation;Demonstration;Handout    Comprehension  Verbalized understanding;Returned demonstration       PT Short Term Goals - 05/08/18 1706      PT SHORT TERM GOAL #1   Title  STG's=LTG's        PT Long Term Goals - 05/08/18 1706      PT LONG TERM GOAL #1   Title  Pt demonstrates compliancy and independence with performing HEP in order to improve dynamic balance and LE strength (TARGET DATE FOR ALL LTGS: 06/07/18)    Time  4    Period  Weeks    Status  New    Target Date  06/07/18      PT LONG TERM GOAL #2   Title  Pt will improve Berg Balance score from 50/56 to >53/56 to decreased fall risk potential from moderate to low risk    Baseline  50/56 indicating moderate fall risk potential     Time  4    Period  Weeks    Status  New    Target Date  06/07/18      PT LONG TERM GOAL  #3   Title  Pt will improve FGA score from 20/30 to <25/30 demonstrating improvement in dynamic functional mobility and decrease fall risk potential     Baseline  20/30 indicating medium fall risk potential     Time  4    Period  Weeks    Status  New    Target Date  06/07/18      PT LONG TERM GOAL #4   Title  Pt will improve gait speed from 3.28 ft/sec to 4.10 ft/sec indicating normal gait velocity with significant improvement     Baseline  3.28 ft/sec indicating community ambulator    Time  4    Period  Weeks    Status  New    Target Date  06/07/18      PT LONG TERM GOAL #5   Title  Pt will ambulate 1000 feet outdoors working on curbs, uneven surfaces, and changes in gait speed with mod I to improve safety with increased challenge during functional mobility     Baseline  Pt self reports she is able to walk 1 mile distances most commonly on level surfaces     Time  4    Period  Weeks    Status  New    Target Date  06/07/18      Additional Long Term Goals   Additional Long Term Goals  Yes      PT LONG TERM GOAL #6   Title  Pt will be able to perform x10 squats with incoporation of reaching outside BOS to simulate gardening at home with no reports of falling sensation and control with both lowering and rising to stand with mod I     Baseline  Pt reports increased difficulty performing squats to garden at home with sensation she will fall forwards and lose balance    Time  4    Period  Weeks    Status  New    Target Date  06/07/18  Plan - 05/21/18 1536    Clinical Impression Statement  Patient with need for reduction in frequency due to financial constriants. PT session today focusin on establishing and educating on HEP for independent use at home - gave patient many options for self progression safely in the home. Much time spent on squat mechanics as patient hopes to return to gardening but with fear of falling forwards. Making excellent progress. WIll continue to  progress towards established goals.     Rehab Potential  Good    Clinical Impairments Affecting Rehab Potential  Impaired sensation 2/2 Type II DM    PT Frequency  2x / week    PT Duration  4 weeks    PT Treatment/Interventions  ADLs/Self Care Home Management;Gait training;Neuromuscular re-education;Functional mobility training;Stair training;Therapeutic activities;Patient/family education;Vestibular;Therapeutic exercise;Balance training;Visual/perceptual remediation/compensation    PT Next Visit Plan  core focus with HEP for core - patient request    PT Home Exercise Plan  [email or web address]    Consulted and Agree with Plan of Care  Patient;Family member/caregiver    Family Member Consulted  husband Elita Quick        Patient will benefit from skilled therapeutic intervention in order to improve the following deficits and impairments:  Abnormal gait, Dizziness, Impaired sensation, Improper body mechanics, Decreased mobility, Decreased strength, Impaired vision/preception, Increased edema, Decreased balance, Decreased knowledge of use of DME, Impaired flexibility, Impaired UE functional use  Visit Diagnosis: Unsteadiness on feet  Muscle weakness (generalized)  Dizziness and giddiness  History of falling  Other abnormalities of gait and mobility     Problem List Patient Active Problem List   Diagnosis Date Noted  . Acute CVA (cerebrovascular accident) (HCC) 03/21/2018  . Obesity, Class III, BMI 40-49.9 (morbid obesity) (HCC) 03/21/2018  . IBS (irritable bowel syndrome) 04/12/2015  . Hair loss 10/20/2014  . Primary osteoarthritis of left knee 09/29/2014  . Visit for screening mammogram 08/30/2014  . Routine general medical examination at a health care facility 08/30/2014  . Tinea corporis 12/21/2013  . Essential hypertension, benign 12/21/2013  . Low back pain 08/24/2013  . Patient noncompliant with statin medication 02/23/2013  . H/O abnormal Pap smear 10/24/2012  . Osteopenia 09/26/2012   . Diabetic neuropathy, painful (HCC) 05/26/2012  . 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  . Pure hypercholesterolemia 03/20/2012  . Chronic venous insufficiency 03/20/2012     Kipp Laurence, PT, DPT Supplemental Physical Therapist 05/21/18 5:21 PM Pager: [phone] Office: [phone]  [facility] [address] [place], [place], [address] Phone: [phone]   Fax:  [phone]  Name: Jade [name] MRN: [id number] Date of Birth: May 28, 1956

## 2018-05-23 ENCOUNTER — Ambulatory Visit: Payer: Medicare HMO | Admitting: *Deleted

## 2018-06-03 ENCOUNTER — Ambulatory Visit: Payer: Medicare HMO | Admitting: Physical Therapy

## 2018-06-05 ENCOUNTER — Ambulatory Visit: Payer: Medicare HMO | Attending: Family Medicine

## 2018-06-05 DIAGNOSIS — R2689 Other abnormalities of gait and mobility: Secondary | ICD-10-CM | POA: Insufficient documentation

## 2018-06-05 DIAGNOSIS — R42 Dizziness and giddiness: Secondary | ICD-10-CM | POA: Diagnosis present

## 2018-06-05 DIAGNOSIS — M6281 Muscle weakness (generalized): Secondary | ICD-10-CM | POA: Diagnosis not present

## 2018-06-05 DIAGNOSIS — R2681 Unsteadiness on feet: Secondary | ICD-10-CM | POA: Diagnosis present

## 2018-06-05 NOTE — Patient Instructions (Addendum)
Access Code: [email or web address]  URL: [email or web address]  Date: 06/05/2018  Prepared by: Zerita Boers   Exercises  Straight Leg Raise - 10 reps - 2 sets - 1x daily - 7x weekly  (reviewed) Supine Heel Slide - 10 reps - 2 sets - 1x daily - 7x weekly  (reviewed) Marching Bridge - 10 reps - 3 sets - 1x daily - 3x weekly (new) Butterfly Bridge - 10 reps - 3 sets - 1x (new)

## 2018-06-05 NOTE — Therapy (Signed)
[facility] [address] [place], [place], [address] Phone: [phone]   Fax:  [phone]  Physical Therapy Treatment  Patient Details  Name: Jade [name] MRN: [id number] Date of Birth: 1956/02/17 Referring Provider (PT): Osvaldo Shipper, MD   Encounter Date: 06/05/2018  PT End of Session - 06/05/18 1624    Visit Number  3    Number of Visits  8    Date for PT Re-Evaluation  06/07/18    Authorization Type  [employer] HMO- progress notes every 10 visits     PT Start Time  1532    PT Stop Time  1620    PT Time Calculation (min)  48 min    Equipment Utilized During Treatment  --   min guard to S prn   Activity Tolerance  Patient tolerated treatment well    Behavior During Therapy  [facility] for tasks assessed/performed       Past Medical History:  Diagnosis Date  . Arthritis    "back, knees, some in my left hip" (03/21/2018)  . Chronic kidney disease    "was told I had 25% kidney function in early 2012" (03/21/2018)  . COPD (chronic obstructive pulmonary disease) (HCC)   . CVA (cerebral vascular accident) (HCC) 03/21/2018   "numb all over; head to toe" (03/21/2018)  . Fibromyalgia   . History of gout   . Hyperlipidemia   . Hypertension   . Migraine    "used to get them monthly during menopause" (03/21/2018)  . Neuromuscular disorder (HCC)   . OSA (obstructive sleep apnea)    "don't tolerate the machine" (03/21/2018)  . Pneumonia ~ 2007  . Type 2 diabetes, diet controlled (HCC)     Past Surgical History:  Procedure Laterality Date  . HERNIA REPAIR    . LAPAROSCOPIC CHOLECYSTECTOMY  06/2007   "no UHR w/this" (03/21/2018)  . TONSILLECTOMY    . UMBILICAL HERNIA REPAIR  2009    There were no vitals filed for this visit.  Subjective Assessment - 06/05/18 1536    Subjective  Pt denid falls since last visit. pt reported her R hand N/T (pt has hx of carpel tunnel syndrome) is still bothering her but she  has not informed her MD. Has appt with [facility] on 06/27/18. Pt reported HEP is going well. Pt found out yesterday she has diabetic retinopathy but is trying natural remedies for 30 days.    Pertinent History  Stroke, Type II DM, morid obesity, HTN, COPD, CKD    Patient Stated Goals  Being able to bend down to garden safely without sensation of falling forwards    Currently in Pain?  Yes    Pain Score  3     Pain Location  Hand    Pain Orientation  Right    Pain Descriptors / Indicators  Numbness;Tingling    Pain Onset  More than a month ago    Pain Frequency  Constant    Aggravating Factors   picking up something of large circumference (shooting pain)    Pain Relieving Factors  Nothing         Therex: Access Code: [email or web address]  URL: [email or web address]  Date: 06/05/2018  Prepared by: Zerita Boers   Exercises  Straight Leg Raise - 10 reps - 2 sets - 1x daily - 7x weekly  (reviewed) Supine Heel Slide - 10 reps - 2 sets - 1x daily - 7x weekly  (reviewed) Marching Bridge - 10 reps - 3  sets - 1x daily - 3x weekly (new) Butterfly Bridge - 10 reps - 3 sets - 1x (new)              [facility] Adult PT Treatment/Exercise - 06/05/18 1543      High Level Balance   High Level Balance Activities  Backward walking;Tandem walking;Side stepping    High Level Balance Comments  In // bars with intermittent 0-1 UE support and min guard to S for safety. Backwards amb. performed on non-compliant surface. Other activities performed on foam beam. Cues to improve stride length, and anterior weight shifting. Each activity performed 8x10'.       Exercises   Exercises  Knee/Hip      Knee/Hip Exercises: Supine   Bridges  5 reps;Strengthening;Both    Bridges Limitations  Cues to engage TrA musculature.    Bridges with Clamshell  Both;Strengthening;3 sets;10 reps    Other Supine Knee/Hip Exercises  Bridge with B hip marches x10 reps. Cues for technique and to improve eccentric control.  Please see pt instructions for HEP details.              PT Education - 06/05/18 1623    Education Details  PT reviewed prior HEP and added variations of bridges to HEP. PT discussed renewal 2/2 missed appt's and adding more appt's, pt wishes to stay with 1x/week for 4 weeks 2/2 financial concerns. PT had to redirect back to exercises, as pt very talkative.     Person(s) Educated  Patient    Methods  Explanation;Demonstration;Verbal cues;Handout    Comprehension  Verbalized understanding;Returned demonstration       PT Short Term Goals - 05/08/18 1706      PT SHORT TERM GOAL #1   Title  STG's=LTG's        PT Long Term Goals - 06/05/18 1631      PT LONG TERM GOAL #1   Title  Pt demonstrates compliancy and independence with performing HEP in order to improve dynamic balance and LE strength (TARGET DATE FOR ALL LTGS: 06/07/18). all deferred to new POC: 07/15/18    Time  4    Period  Weeks    Status  New      PT LONG TERM GOAL #2   Title  Pt will improve Berg Balance score from 50/56 to >53/56 to decreased fall risk potential from moderate to low risk    Baseline  50/56 indicating moderate fall risk potential     Time  4    Period  Weeks    Status  New      PT LONG TERM GOAL #3   Title  Pt will improve FGA score from 20/30 to <25/30 demonstrating improvement in dynamic functional mobility and decrease fall risk potential     Baseline  20/30 indicating medium fall risk potential     Time  4    Period  Weeks    Status  New      PT LONG TERM GOAL #4   Title  Pt will improve gait speed from 3.28 ft/sec to 4.10 ft/sec indicating normal gait velocity with significant improvement     Baseline  3.28 ft/sec indicating community ambulator    Time  4    Period  Weeks    Status  New      PT LONG TERM GOAL #5   Title  Pt will ambulate 1000 feet outdoors working on curbs, uneven surfaces, and changes in gait speed with mod  I to improve safety with increased challenge during  functional mobility     Baseline  Pt self reports she is able to walk 1 mile distances most commonly on level surfaces     Time  4    Period  Weeks    Status  New      PT LONG TERM GOAL #6   Title  Pt will be able to perform x10 squats with incoporation of reaching outside BOS to simulate gardening at home with no reports of falling sensation and control with both lowering and rising to stand with mod I     Baseline  Pt reports increased difficulty performing squats to garden at home with sensation she will fall forwards and lose balance    Time  4    Period  Weeks    Status  New            Plan - 06/05/18 1626    Clinical Impression Statement  Today's skilled session focused on adding strengthening exercises to HEP, which pt tolerated well. PT provided pt with cues and demo for proper technique on previous HEP. Pt required incr. UE support and assist to maintain balance during high level balance activities, especially over compliant surfaces. Pt's balance impairments appear to be multi-factorial as pt has hx of scoliosis per pt. PT requesting addtional time prior to assessing goals, as pt missed appt's due to scheduling and reducing frequency 2/2 financial concerns. PT will hold checking LTGs for 4 weeks, to allow pt appropriate time to participate in PT. Pt would continue to benefit from skilled PT to improve safety during functional mobility.     Rehab Potential  Good    Clinical Impairments Affecting Rehab Potential  Impaired sensation 2/2 Type II DM    PT Frequency  2x / week    PT Duration  4 weeks   requesting additional visits   PT Treatment/Interventions  ADLs/Self Care Home Management;Gait training;Neuromuscular re-education;Functional mobility training;Stair training;Therapeutic activities;Patient/family education;Vestibular;Therapeutic exercise;Balance training;Visual/perceptual remediation/compensation    PT Next Visit Plan  Gait training and high level balance activities.  Postural exercises.     PT Home Exercise Plan  [email or web address]    Consulted and Agree with Plan of Care  Patient;Family member/caregiver    Family Member Consulted  husband Jose        Patient will benefit from skilled therapeutic intervention in order to improve the following deficits and impairments:  Abnormal gait, Dizziness, Impaired sensation, Improper body mechanics, Decreased mobility, Decreased strength, Impaired vision/preception, Increased edema, Decreased balance, Decreased knowledge of use of DME, Impaired flexibility, Impaired UE functional use  Visit Diagnosis: Muscle weakness (generalized) - Plan: PT plan of care cert/re-cert  Unsteadiness on feet - Plan: PT plan of care cert/re-cert  Dizziness and giddiness - Plan: PT plan of care cert/re-cert  Other abnormalities of gait and mobility - Plan: PT plan of care cert/re-cert     Problem List Patient Active Problem List   Diagnosis Date Noted  . Acute CVA (cerebrovascular accident) (HCC) 03/21/2018  . Obesity, Class III, BMI 40-49.9 (morbid obesity) (HCC) 03/21/2018  . IBS (irritable bowel syndrome) 04/12/2015  . Hair loss 10/20/2014  . Primary osteoarthritis of left knee 09/29/2014  . Visit for screening mammogram 08/30/2014  . Routine general medical examination at a health care facility 08/30/2014  . Tinea corporis 12/21/2013  . Essential hypertension, benign 12/21/2013  . Low back pain 08/24/2013  . Patient noncompliant with statin medication 02/23/2013  . H/O abnormal  Pap smear 10/24/2012  . Osteopenia 09/26/2012  . Diabetic neuropathy, painful (HCC) 05/26/2012  . 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  . Pure hypercholesterolemia 03/20/2012  . Chronic venous insufficiency 03/20/2012    Miller,Jennifer L 06/05/2018, 4:35 PM  [facility] [address] [place], [place], [address] Phone: [phone]   Fax:   [phone]  Name: Jade [name] MRN: [id number] Date of Birth: 04-Sep-1955  Zerita Boers, PT,DPT 06/05/18 4:35 PM Phone: [phone] Fax: [phone]

## 2018-06-12 ENCOUNTER — Ambulatory Visit: Payer: Medicare HMO

## 2018-06-12 DIAGNOSIS — R2681 Unsteadiness on feet: Secondary | ICD-10-CM

## 2018-06-12 DIAGNOSIS — R2689 Other abnormalities of gait and mobility: Secondary | ICD-10-CM

## 2018-06-12 DIAGNOSIS — M6281 Muscle weakness (generalized): Secondary | ICD-10-CM | POA: Diagnosis not present

## 2018-06-12 DIAGNOSIS — R42 Dizziness and giddiness: Secondary | ICD-10-CM

## 2018-06-12 NOTE — Therapy (Signed)
[facility] [address] [place], [place], [address] Phone: [phone]   Fax:  [phone]  Physical Therapy Treatment  Patient Details  Name: Jade [name] MRN: [id number] Date of Birth: 03/04/1956 Referring Provider (PT): Osvaldo Shipper, MD   Encounter Date: 06/12/2018  PT End of Session - 06/12/18 1611    Visit Number  4    Number of Visits  8    Date for PT Re-Evaluation  07/07/18    Authorization Type  [employer] H[employer]- progress notes every 10 visits     PT Start Time  1527    PT Stop Time  1608    PT Time Calculation (min)  41 min    Equipment Utilized During Treatment  Gait belt    Activity Tolerance  Patient tolerated treatment well    Behavior During Therapy  [facility] for tasks assessed/performed       Past Medical History:  Diagnosis Date  . Arthritis    "back, knees, some in my left hip" (03/21/2018)  . Chronic kidney disease    "was told I had 25% kidney function in early 2012" (03/21/2018)  . COPD (chronic obstructive pulmonary disease) (HCC)   . CVA (cerebral vascular accident) (HCC) 03/21/2018   "numb all over; head to toe" (03/21/2018)  . Fibromyalgia   . History of gout   . Hyperlipidemia   . Hypertension   . Migraine    "used to get them monthly during menopause" (03/21/2018)  . Neuromuscular disorder (HCC)   . OSA (obstructive sleep apnea)    "don't tolerate the machine" (03/21/2018)  . Pneumonia ~ 2007  . Type 2 diabetes, diet controlled (HCC)     Past Surgical History:  Procedure Laterality Date  . HERNIA REPAIR    . LAPAROSCOPIC CHOLECYSTECTOMY  06/2007   "no UHR w/this" (03/21/2018)  . TONSILLECTOMY    . UMBILICAL HERNIA REPAIR  2009    There were no vitals filed for this visit.  Subjective Assessment - 06/12/18 1529    Subjective  PT denied falls or changes since last visit. Pt purchased copper glove to decr. carpel tunnel syndrome pain in R hand and states it does help. Pt  had to rearrange schedule 2/2 finances and will take a three week break. Pt reported exercises have been going well.     Pertinent History  Stroke, Type II DM, morid obesity, HTN, COPD, CKD    Patient Stated Goals  Being able to bend down to garden safely without sensation of falling forwards    Currently in Pain?  No/denies                       [facility] Adult PT Treatment/Exercise - 06/12/18 1521      Ambulation/Gait   Ambulation/Gait  Yes    Ambulation/Gait Assistance  5: Supervision;4: Min guard    Ambulation/Gait Assistance Details  Pt amb. over paved outdoor terrain while performing dynamic gait activities (various speeds, head nods/turns). Pt noted to experience intermittent LOB with stepping strategy utilize to maintain balance.     Ambulation Distance (Feet)  1000 Feet    Assistive device  None    Gait Pattern  Step-through pattern;Decreased arm swing - right;Decreased arm swing - left;Decreased dorsiflexion - left;Decreased dorsiflexion - right;Decreased trunk rotation;Wide base of support    Ambulation Surface  Level;Unlevel;Indoor;Outdoor;Paved      High Level Balance   High Level Balance Activities  Side stepping;Backward walking;Head turns  sidestepping was with mini squats; eyes closed c forward amb   High Level Balance Comments  Pt also performed heel/toe amb. on mats. Performed at counter on red/blue mats with intermittent UE support and min guard to S, with one episode of LOB which required min A to maintain balance. Cues and demo for technique and to improve weight shifting and step length. 6x15'/activity. Pt required one standing and one seated rest break 2/2 fatigue and unsteadiness.                PT Short Term Goals - 05/08/18 1706      PT SHORT TERM GOAL #1   Title  STG's=LTG's        PT Long Term Goals - 06/12/18 1616      PT LONG TERM GOAL #1   Title  Pt demonstrates compliancy and independence with performing HEP in order to improve  dynamic balance and LE strength (TARGET DATE FOR ALL LTGS: 06/07/18). all deferred to new POC: 07/07/18    Time  4    Period  Weeks    Status  New      PT LONG TERM GOAL #2   Title  Pt will improve Berg Balance score from 50/56 to >53/56 to decreased fall risk potential from moderate to low risk    Baseline  50/56 indicating moderate fall risk potential     Time  4    Period  Weeks    Status  New      PT LONG TERM GOAL #3   Title  Pt will improve FGA score from 20/30 to <25/30 demonstrating improvement in dynamic functional mobility and decrease fall risk potential     Baseline  20/30 indicating medium fall risk potential     Time  4    Period  Weeks    Status  New      PT LONG TERM GOAL #4   Title  Pt will improve gait speed from 3.28 ft/sec to 4.10 ft/sec indicating normal gait velocity with significant improvement     Baseline  3.28 ft/sec indicating community ambulator    Time  4    Period  Weeks    Status  New      PT LONG TERM GOAL #5   Title  Pt will ambulate 1000 feet outdoors working on curbs, uneven surfaces, and changes in gait speed with mod I to improve safety with increased challenge during functional mobility     Baseline  Pt self reports she is able to walk 1 mile distances most commonly on level surfaces     Time  4    Period  Weeks    Status  New      PT LONG TERM GOAL #6   Title  Pt will be able to perform x10 squats with incoporation of reaching outside BOS to simulate gardening at home with no reports of falling sensation and control with both lowering and rising to stand with mod I     Baseline  Pt reports increased difficulty performing squats to garden at home with sensation she will fall forwards and lose balance    Time  4    Period  Weeks    Status  New            Plan - 06/12/18 1612    Clinical Impression Statement  Pt demonstrated progress, as she tolerated high level balance activities on compliant surfaces well, by only requiring min A  durng one LOB episode-to maintain balance. Pt noted to experience incr. postural sway during amb. over paved surfaces, while performing head turns/nods but self corrected balance by stepping strategy. Pt would continue to benefit from skilled PT to improve safety during functional mobility.     Rehab Potential  Good    Clinical Impairments Affecting Rehab Potential  Impaired sensation 2/2 Type II DM    PT Frequency  2x / week    PT Duration  4 weeks   requesting additional visits   PT Treatment/Interventions  ADLs/Self Care Home Management;Gait training;Neuromuscular re-education;Functional mobility training;Stair training;Therapeutic activities;Patient/family education;Vestibular;Therapeutic exercise;Balance training;Visual/perceptual remediation/compensation    PT Next Visit Plan  Renew (check goals). Phyisoball/core activities. Gait training and high level balance activities. Postural exercises.     PT Home Exercise Plan  [id number]    Consulted and Agree with Plan of Care  Patient;Family member/caregiver    Family Member Consulted  husband Elita Quick        Patient will benefit from skilled therapeutic intervention in order to improve the following deficits and impairments:  Abnormal gait, Dizziness, Impaired sensation, Improper body mechanics, Decreased mobility, Decreased strength, Impaired vision/preception, Increased edema, Decreased balance, Decreased knowledge of use of DME, Impaired flexibility, Impaired UE functional use  Visit Diagnosis: Other abnormalities of gait and mobility  Unsteadiness on feet  Dizziness and giddiness     Problem List Patient Active Problem List   Diagnosis Date Noted  . Acute CVA (cerebrovascular accident) (HCC) 03/21/2018  . Obesity, Class III, BMI 40-49.9 (morbid obesity) (HCC) 03/21/2018  . IBS (irritable bowel syndrome) 04/12/2015  . Hair loss 10/20/2014  . Primary osteoarthritis of left knee 09/29/2014  . Visit for screening mammogram 08/30/2014  .  Routine general medical examination at a health care facility 08/30/2014  . Tinea corporis 12/21/2013  . Essential hypertension, benign 12/21/2013  . Low back pain 08/24/2013  . Patient noncompliant with statin medication 02/23/2013  . H/O abnormal Pap smear 10/24/2012  . Osteopenia 09/26/2012  . Diabetic neuropathy, painful (HCC) 05/26/2012  . 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  . Pure hypercholesterolemia 03/20/2012  . Chronic venous insufficiency 03/20/2012    Toron Bowring L 06/12/2018, 4:16 PM  [facility] [address] [place], [place], [address] Phone: [phone]   Fax:  [phone]  Name: Jade [name] MRN: [id number] Date of Birth: 1955-10-22  Zerita Boers, PT,DPT 06/12/18 4:16 PM Phone: [phone] Fax: [phone]

## 2018-06-13 ENCOUNTER — Ambulatory Visit: Payer: Medicare HMO | Admitting: Physical Therapy

## 2018-06-18 ENCOUNTER — Ambulatory Visit: Payer: Medicare HMO | Admitting: Physical Therapy

## 2018-06-20 ENCOUNTER — Ambulatory Visit: Payer: Medicare HMO | Admitting: Physical Therapy

## 2018-06-24 ENCOUNTER — Ambulatory Visit: Payer: Medicare HMO | Admitting: Physical Therapy

## 2018-06-26 ENCOUNTER — Ambulatory Visit: Payer: Medicare HMO

## 2018-07-10 ENCOUNTER — Ambulatory Visit: Payer: Medicare HMO

## 2018-07-10 NOTE — Therapy (Signed)
[facility] [address] [place], [place], [address] Phone: [phone]   Fax:  [phone]  Patient Details  Name: Jade [name] MRN: [id number] Date of Birth: 08-22-1956 Referring Provider:  No ref. provider found  Encounter Date: 07/10/2018   PHYSICAL THERAPY DISCHARGE SUMMARY  Visits from Start of Care: 4  Current functional level related to goals / functional outcomes: PT Long Term Goals - 06/12/18 1616      PT LONG TERM GOAL #1   Title  Pt demonstrates compliancy and independence with performing HEP in order to improve dynamic balance and LE strength (TARGET DATE FOR ALL LTGS: 06/07/18). all deferred to new POC: 07/07/18    Time  4    Period  Weeks    Status  New      PT LONG TERM GOAL #2   Title  Pt will improve Berg Balance score from 50/56 to >53/56 to decreased fall risk potential from moderate to low risk    Baseline  50/56 indicating moderate fall risk potential     Time  4    Period  Weeks    Status  New      PT LONG TERM GOAL #3   Title  Pt will improve FGA score from 20/30 to <25/30 demonstrating improvement in dynamic functional mobility and decrease fall risk potential     Baseline  20/30 indicating medium fall risk potential     Time  4    Period  Weeks    Status  New      PT LONG TERM GOAL #4   Title  Pt will improve gait speed from 3.28 ft/sec to 4.10 ft/sec indicating normal gait velocity with significant improvement     Baseline  3.28 ft/sec indicating community ambulator    Time  4    Period  Weeks    Status  New      PT LONG TERM GOAL #5   Title  Pt will ambulate 1000 feet outdoors working on curbs, uneven surfaces, and changes in gait speed with mod I to improve safety with increased challenge during functional mobility     Baseline  Pt self reports she is able to walk 1 mile distances most commonly on level surfaces     Time  4    Period  Weeks    Status  New      PT LONG  TERM GOAL #6   Title  Pt will be able to perform x10 squats with incoporation of reaching outside BOS to simulate gardening at home with no reports of falling sensation and control with both lowering and rising to stand with mod I     Baseline  Pt reports increased difficulty performing squats to garden at home with sensation she will fall forwards and lose balance    Time  4    Period  Weeks    Status  New         Remaining deficits: Unknown, as pt cancelled remaining appt's 2/2 high co-pay. Last visit 05/2018.   Education / Equipment: HEP  Plan: Patient agrees to discharge.  Patient goals were not met. Patient is being discharged due to financial reasons.  ?????       Miller,Jennifer L 07/10/2018, 10:46 AM  [facility] [address] [place], [place], [address] Phone: [phone]   Fax:  [phone]   Geoffry Paradise, PT,DPT 07/10/18 10:47 AM Phone: [phone] Fax: [phone]

## 2018-07-16 ENCOUNTER — Ambulatory Visit: Payer: Medicare HMO | Admitting: Physical Therapy

## 2018-07-17 ENCOUNTER — Ambulatory Visit: Payer: Medicare HMO

## 2021-02-06 ENCOUNTER — Other Ambulatory Visit: Payer: Self-pay | Admitting: Neurology

## 2021-02-06 ENCOUNTER — Other Ambulatory Visit (HOSPITAL_COMMUNITY): Payer: Self-pay | Admitting: Neurology

## 2021-02-06 DIAGNOSIS — I6521 Occlusion and stenosis of right carotid artery: Secondary | ICD-10-CM

## 2021-02-06 DIAGNOSIS — I6930 Unspecified sequelae of cerebral infarction: Secondary | ICD-10-CM

## 2021-02-15 ENCOUNTER — Ambulatory Visit (HOSPITAL_COMMUNITY): Payer: Medicare Other

## 2021-02-17 ENCOUNTER — Ambulatory Visit (HOSPITAL_COMMUNITY)
Admission: EM | Admit: 2021-02-17 | Discharge: 2021-02-17 | Disposition: A | Discharge status: Home / Self Care | Payer: Medicare Other

## 2021-02-17 ENCOUNTER — Encounter (HOSPITAL_COMMUNITY): Payer: Self-pay | Admitting: Emergency Medicine

## 2021-02-17 DIAGNOSIS — I10 Essential (primary) hypertension: Secondary | ICD-10-CM

## 2021-02-17 MED ORDER — HYDROCHLOROTHIAZIDE 12.5 MG PO CAPS: 12.5000 mg | ORAL_CAPSULE | Freq: Every day | ORAL | 0 refills | Status: DC

## 2021-02-17 NOTE — Discharge Instructions (Addendum)
Continue current medication.  See your Physicain next week for recheck.  Avoid salt, Exercise,

## 2021-02-17 NOTE — ED Triage Notes (Signed)
Pt is present today with HTN. Pt states that her BP has been running 200/100's. Pt denies any chest, SOB, or dizziness. Pt states that she did noticed a HA two days a go. Pt blood medication was changed two weeks ago to Losartan.

## 2021-02-17 NOTE — ED Provider Notes (Signed)
[facility]    CSN: [id number] Arrival date & time: 02/17/21  1640      History   Chief Complaint Chief Complaint  Patient presents with   Hypertension    HPI Jade [name] is a 65 y.o. female.   Pt reports her blood pressure was high at Physical therapy yesterday and her MD wanted her to come in to get her blood pressure checked. Pt started losartan 2 weeks ago.    The history is provided by the patient. No language interpreter was used.  Hypertension This is a recurrent problem. The problem occurs constantly. The problem has not changed since onset.Pertinent negatives include no chest pain. Nothing aggravates the symptoms. Nothing relieves the symptoms. She has tried nothing for the symptoms. The treatment provided no relief.   Past Medical History:  Diagnosis Date   Arthritis    "back, knees, some in my left hip" (03/21/2018)   Chronic kidney disease    "was told I had 25% kidney function in early 2012" (03/21/2018)   COPD (chronic obstructive pulmonary disease) ([place])    CVA (cerebral vascular accident) ([place]) 03/21/2018   "numb all over; head to toe" (03/21/2018)   Fibromyalgia    History of gout    Hyperlipidemia    Hypertension    Migraine    "used to get them monthly during menopause" (03/21/2018)   Neuromuscular disorder ([place])    OSA (obstructive sleep apnea)    "don't tolerate the machine" (03/21/2018)   Pneumonia ~ 2007   Type 2 diabetes, diet controlled ([place])     Patient Active Problem List   Diagnosis Date Noted   Acute CVA (cerebrovascular accident) ([place]) 03/21/2018   Obesity, Class III, BMI 40-49.9 (morbid obesity) ([place]) 03/21/2018   IBS (irritable bowel syndrome) 04/12/2015   Hair loss 10/20/2014   Primary osteoarthritis of left knee 09/29/2014   Visit for screening mammogram 08/30/2014   Routine general medical examination at a health care facility 08/30/2014   Tinea corporis 12/21/2013   Essential hypertension, benign 12/21/2013    Low back pain 08/24/2013   Patient noncompliant with statin medication 02/23/2013   H/O abnormal Pap smear 10/24/2012   Osteopenia 09/26/2012   Diabetic neuropathy, painful ([place]) 05/26/2012   Diabetes mellitus type 2 with neurological manifestations ([place]) 03/20/2012   Pure hypercholesterolemia 03/20/2012   Chronic venous insufficiency 03/20/2012    Past Surgical History:  Procedure Laterality Date   HERNIA REPAIR     LAPAROSCOPIC CHOLECYSTECTOMY  06/2007   "no UHR w/this" (03/21/2018)   TONSILLECTOMY     UMBILICAL HERNIA REPAIR  2009    OB History     Gravida  3   Para  1   Term  1   Preterm      AB  2   Living  1      SAB  2   IAB      Ectopic      Multiple      Live Births  1            Home Medications    Prior to Admission medications   Medication Sig Start Date End Date Taking? Authorizing Provider  hydrochlorothiazide (MICROZIDE) 12.5 MG capsule Take 1 capsule (12.5 mg total) by mouth daily. 02/17/21 02/17/22 Yes Fransico Meadow, PA-C  Blood Glucose Calibration (ACCU-CHEK SMARTVIEW CONTROL) LIQD Test up to TID dx:E11.40 09/13/14   Janith Lima, MD  Blood Glucose Monitoring Suppl (ACCU-CHEK NANO SMARTVIEW) W/DEVICE KIT Test  up to TID dx:E11.40 09/13/14   Janith Lima, MD  Cholecalciferol (VITAMIN D-3) 5000 UNITS TABS Take 5,000 Units by mouth daily.     [provider]  Coenzyme Q10 (COQ-10) 100 MG CAPS Take 100 mg by mouth daily.    [provider]  furosemide (LASIX) 20 MG tablet Take 20 mg by mouth daily. 03/14/18   [provider]  glucose blood (ACCU-CHEK SMARTVIEW) test strip Test up to TID dx:E11.40 09/13/14   Janith Lima, MD  Lancets Misc. (ACCU-CHEK FASTCLIX LANCET) KIT Test up to TID dx:E11.40 09/13/14   Janith Lima, MD  losartan (COZAAR) 25 MG tablet Take 25 mg by mouth daily. 01/30/21   [provider]  METHYLCOBALAMIN PO Take 1 tablet by mouth daily.     [provider]  Multiple  Vitamins-Minerals (MULTIVITAL-M) TABS Take 1 tablet by mouth daily.     [provider]  Omega-3 Fatty Acids (FISH OIL) 600 MG CAPS Take 600 mg by mouth 3 (three) times daily.     [provider]  potassium gluconate 595 MG TABS Take 595 mg by mouth 3 (three) times daily.    [provider]    Family History Family History  Problem Relation Age of Onset   Heart disease Father    Leukemia Father    Alcohol abuse Son    Other Mother    Cancer Neg Hx    Diabetes Neg Hx    Hearing loss Neg Hx    Hyperlipidemia Neg Hx    Hypertension Neg Hx    Kidney disease Neg Hx    Stroke Neg Hx     Social History Social History   Tobacco Use   Smoking status: Former    Packs/day: 1.25    Years: 16.00    Pack years: 20.00    Types: Cigarettes    Quit date: 08/28/1987    Years since quitting: 33.4   Smokeless tobacco: Never  Vaping Use   Vaping Use: Never used  Substance Use Topics   Alcohol use: Not Currently   Drug use: Not Currently    Types: Marijuana     Allergies   Nickel   Review of Systems Review of Systems  Constitutional:  Negative for chills and fever.  HENT:  Negative for ear pain.   Cardiovascular:  Negative for chest pain.  Gastrointestinal:  Negative for vomiting.  Skin:  Negative for color change and rash.  All other systems reviewed and are negative.   Physical Exam Triage Vital Signs ED Triage Vitals  Enc Vitals Group     BP 02/17/21 1719 (!) 170/120     Pulse Rate 02/17/21 1719 74     Resp 02/17/21 1719 19     Temp 02/17/21 1730 99 F (37.2 C)     Temp Source 02/17/21 1719 Oral     SpO2 02/17/21 1719 96 %     Weight --      Height --      Head Circumference --      Peak Flow --      Pain Score 02/17/21 1719 0     Pain Loc --      Pain Edu? --      Excl. in [place]? --    No data found.  Updated Vital Signs BP (!) 170/120   Pulse 74   Temp 99 F (37.2 C)   Resp 19   SpO2 96%   Visual Acuity Right  Eye Distance:    Left Eye Distance:   Bilateral Distance:    Right Eye Near:   Left Eye Near:    Bilateral Near:     Physical Exam   UC Treatments / Results  Labs (all labs ordered are listed, but only abnormal results are displayed) Labs Reviewed - No data to display  EKG   Radiology No results found.  Procedures Procedures (including critical care time)  Medications Ordered in UC Medications - No data to display  Initial Impression / Assessment and Plan / UC Course  I have reviewed the triage vital signs and the nursing notes.  Pertinent labs & imaging results that were available during my care of the patient were reviewed by me and considered in my medical decision making (see chart for details).     MDM:  Pt given rx for hctz.  Pt states this medication makes her cough.  Pt has been on clonidine and amlodipine in the past but they made her pressure drop low.   Pt counseled on diet and exercise.  Pt has frequent falls.  Pt counseled on exercises she can do while sitting.  Pt counseled on avoiding salt/sodium  Pt advised she can try hctz/stop if it causes cough.  Pt expresses reluctance to take medications.  Pt reports she takes multiple supplements and does not like medications.  Final Clinical Impressions(s) / UC Diagnoses   Final diagnoses:  Hypertension, unspecified type     Discharge Instructions      Continue current medication.  See your Physicain next week for recheck.  Avoid salt, Exercise,    ED Prescriptions     Medication Sig Dispense Auth. Provider   hydrochlorothiazide (MICROZIDE) 12.5 MG capsule Take 1 capsule (12.5 mg total) by mouth daily. 30 capsule Fransico Meadow, [place]      PDMP not reviewed this encounter.   Fransico Meadow, [place] 02/17/21 [phone]

## 2021-02-20 ENCOUNTER — Ambulatory Visit (HOSPITAL_COMMUNITY)
Admission: RE | Admit: 2021-02-20 | Discharge: 2021-02-20 | Disposition: A | Discharge status: Home / Self Care | Payer: Medicare Other | Source: Ambulatory Visit | Attending: Neurology | Admitting: Neurology

## 2021-02-20 ENCOUNTER — Other Ambulatory Visit: Payer: Self-pay

## 2021-02-20 DIAGNOSIS — I6521 Occlusion and stenosis of right carotid artery: Secondary | ICD-10-CM | POA: Diagnosis not present

## 2021-02-20 NOTE — Progress Notes (Signed)
VASCULAR LAB    Carotid duplex has been performed.  See CV proc for preliminary results.   Zamariyah Furukawa, RVT 02/20/2021, 3:50 PM

## 2021-03-05 ENCOUNTER — Encounter (HOSPITAL_COMMUNITY): Payer: Self-pay

## 2021-03-05 ENCOUNTER — Inpatient Hospital Stay (HOSPITAL_COMMUNITY)
Admission: EM | Admit: 2021-03-05 | Discharge: 2021-03-14 | DRG: 065 | Disposition: A | Discharge status: Home Health | Payer: Medicare Other | Attending: Internal Medicine | Admitting: Internal Medicine

## 2021-03-05 ENCOUNTER — Emergency Department (HOSPITAL_COMMUNITY): Payer: Medicare Other

## 2021-03-05 DIAGNOSIS — Z806 Family history of leukemia: Secondary | ICD-10-CM

## 2021-03-05 DIAGNOSIS — M797 Fibromyalgia: Secondary | ICD-10-CM | POA: Diagnosis present

## 2021-03-05 DIAGNOSIS — R2990 Unspecified symptoms and signs involving the nervous system: Secondary | ICD-10-CM | POA: Diagnosis present

## 2021-03-05 DIAGNOSIS — R296 Repeated falls: Secondary | ICD-10-CM | POA: Diagnosis present

## 2021-03-05 DIAGNOSIS — I639 Cerebral infarction, unspecified: Secondary | ICD-10-CM | POA: Diagnosis present

## 2021-03-05 DIAGNOSIS — Z91148 Patient's other noncompliance with medication regimen for other reason: Secondary | ICD-10-CM

## 2021-03-05 DIAGNOSIS — H510 Palsy (spasm) of conjugate gaze: Secondary | ICD-10-CM | POA: Diagnosis present

## 2021-03-05 DIAGNOSIS — I6381 Other cerebral infarction due to occlusion or stenosis of small artery: Principal | ICD-10-CM | POA: Diagnosis present

## 2021-03-05 DIAGNOSIS — E876 Hypokalemia: Secondary | ICD-10-CM | POA: Diagnosis present

## 2021-03-05 DIAGNOSIS — G35 Multiple sclerosis: Secondary | ICD-10-CM | POA: Diagnosis present

## 2021-03-05 DIAGNOSIS — H499 Unspecified paralytic strabismus: Secondary | ICD-10-CM

## 2021-03-05 DIAGNOSIS — I161 Hypertensive emergency: Secondary | ICD-10-CM | POA: Diagnosis not present

## 2021-03-05 DIAGNOSIS — Z9119 Patient's noncompliance with other medical treatment and regimen: Secondary | ICD-10-CM

## 2021-03-05 DIAGNOSIS — E1149 Type 2 diabetes mellitus with other diabetic neurological complication: Secondary | ICD-10-CM | POA: Diagnosis present

## 2021-03-05 DIAGNOSIS — I10 Essential (primary) hypertension: Secondary | ICD-10-CM | POA: Diagnosis present

## 2021-03-05 DIAGNOSIS — Z79899 Other long term (current) drug therapy: Secondary | ICD-10-CM

## 2021-03-05 DIAGNOSIS — I6522 Occlusion and stenosis of left carotid artery: Secondary | ICD-10-CM | POA: Diagnosis present

## 2021-03-05 DIAGNOSIS — G4700 Insomnia, unspecified: Secondary | ICD-10-CM | POA: Diagnosis present

## 2021-03-05 DIAGNOSIS — E11319 Type 2 diabetes mellitus with unspecified diabetic retinopathy without macular edema: Secondary | ICD-10-CM | POA: Diagnosis present

## 2021-03-05 DIAGNOSIS — J449 Chronic obstructive pulmonary disease, unspecified: Secondary | ICD-10-CM | POA: Diagnosis present

## 2021-03-05 DIAGNOSIS — E669 Obesity, unspecified: Secondary | ICD-10-CM | POA: Diagnosis present

## 2021-03-05 DIAGNOSIS — Z9049 Acquired absence of other specified parts of digestive tract: Secondary | ICD-10-CM

## 2021-03-05 DIAGNOSIS — Z7982 Long term (current) use of aspirin: Secondary | ICD-10-CM

## 2021-03-05 DIAGNOSIS — N1831 Chronic kidney disease, stage 3a: Secondary | ICD-10-CM | POA: Diagnosis present

## 2021-03-05 DIAGNOSIS — E1122 Type 2 diabetes mellitus with diabetic chronic kidney disease: Secondary | ICD-10-CM | POA: Diagnosis present

## 2021-03-05 DIAGNOSIS — Z9114 Patient's other noncompliance with medication regimen: Secondary | ICD-10-CM

## 2021-03-05 DIAGNOSIS — I129 Hypertensive chronic kidney disease with stage 1 through stage 4 chronic kidney disease, or unspecified chronic kidney disease: Secondary | ICD-10-CM | POA: Diagnosis present

## 2021-03-05 DIAGNOSIS — Z6832 Body mass index (BMI) 32.0-32.9, adult: Secondary | ICD-10-CM

## 2021-03-05 DIAGNOSIS — E7800 Pure hypercholesterolemia, unspecified: Secondary | ICD-10-CM | POA: Diagnosis present

## 2021-03-05 DIAGNOSIS — Z8249 Family history of ischemic heart disease and other diseases of the circulatory system: Secondary | ICD-10-CM

## 2021-03-05 DIAGNOSIS — Z87891 Personal history of nicotine dependence: Secondary | ICD-10-CM

## 2021-03-05 DIAGNOSIS — Z20822 Contact with and (suspected) exposure to covid-19: Secondary | ICD-10-CM | POA: Diagnosis present

## 2021-03-05 LAB — CBC WITH DIFFERENTIAL/PLATELET
Abs Immature Granulocytes: 0.03 10*3/uL (ref 0.00–0.07)
Basophils Absolute: 0.1 10*3/uL (ref 0.0–0.1)
Basophils Relative: 1 %
Eosinophils Absolute: 0.2 10*3/uL (ref 0.0–0.5)
Eosinophils Relative: 2 %
HCT: 47.3 % — ABNORMAL HIGH (ref 36.0–46.0)
Hemoglobin: 15.6 g/dL — ABNORMAL HIGH (ref 12.0–15.0)
Immature Granulocytes: 0 %
Lymphocytes Relative: 20 %
Lymphs Abs: 1.6 10*3/uL (ref 0.7–4.0)
MCH: 31.8 pg (ref 26.0–34.0)
MCHC: 33 g/dL (ref 30.0–36.0)
MCV: 96.3 fL (ref 80.0–100.0)
Monocytes Absolute: 0.7 10*3/uL (ref 0.1–1.0)
Monocytes Relative: 8 %
Neutro Abs: 5.5 10*3/uL (ref 1.7–7.7)
Neutrophils Relative %: 69 %
Platelets: 328 10*3/uL (ref 150–400)
RBC: 4.91 MIL/uL (ref 3.87–5.11)
RDW: 12.7 % (ref 11.5–15.5)
WBC: 8 10*3/uL (ref 4.0–10.5)
nRBC: 0 % (ref 0.0–0.2)

## 2021-03-05 LAB — COMPREHENSIVE METABOLIC PANEL
ALT: 13 U/L (ref 0–44)
AST: 23 U/L (ref 15–41)
Albumin: 4.1 g/dL (ref 3.5–5.0)
Alkaline Phosphatase: 77 U/L (ref 38–126)
Anion gap: 9 (ref 5–15)
BUN: 21 mg/dL (ref 8–23)
CO2: 25 mmol/L (ref 22–32)
Calcium: 9.7 mg/dL (ref 8.9–10.3)
Chloride: 104 mmol/L (ref 98–111)
Creatinine, Ser: 1.1 mg/dL — ABNORMAL HIGH (ref 0.44–1.00)
GFR, Estimated: 55 mL/min — ABNORMAL LOW (ref >60)
Glucose, Bld: 147 mg/dL — ABNORMAL HIGH (ref 70–99)
Potassium: 3.8 mmol/L (ref 3.5–5.1)
Sodium: 138 mmol/L (ref 135–145)
Total Bilirubin: 1.1 mg/dL (ref 0.3–1.2)
Total Protein: 7.1 g/dL (ref 6.5–8.1)

## 2021-03-05 LAB — TROPONIN I (HIGH SENSITIVITY)
Troponin I (High Sensitivity): 13 ng/L (ref <18)
Troponin I (High Sensitivity): 15 ng/L (ref <18)

## 2021-03-05 LAB — RESP PANEL BY RT-PCR (FLU A&B, COVID) ARPGX2
Influenza A by PCR: NEGATIVE
Influenza B by PCR: NEGATIVE
SARS Coronavirus 2 by RT PCR: NEGATIVE

## 2021-03-05 MED ORDER — ACETAMINOPHEN 160 MG/5ML PO SOLN: 650.0000 mg | ORAL | Status: DC | PRN

## 2021-03-05 MED ORDER — STROKE: EARLY STAGES OF RECOVERY BOOK
Freq: Once | Status: AC
  Filled 2021-03-05: qty 1

## 2021-03-05 MED ORDER — ASPIRIN 325 MG PO TABS
325.0000 mg | ORAL_TABLET | Freq: Every day | ORAL | Status: DC
  Administered 2021-03-05 – 2021-03-06 (×2): 325 mg via ORAL
  Filled 2021-03-05 (×2): qty 1

## 2021-03-05 MED ORDER — ENOXAPARIN SODIUM 40 MG/0.4ML IJ SOSY
40.0000 mg | PREFILLED_SYRINGE | INTRAMUSCULAR | Status: DC
  Administered 2021-03-05 – 2021-03-13 (×9): 40 mg via SUBCUTANEOUS
  Filled 2021-03-05 (×9): qty 0.4

## 2021-03-05 MED ORDER — ACETAMINOPHEN 325 MG PO TABS: 650.0000 mg | ORAL_TABLET | ORAL | Status: DC | PRN

## 2021-03-05 MED ORDER — LABETALOL HCL 5 MG/ML IV SOLN: 5.0000 mg | INTRAVENOUS | Status: DC | PRN

## 2021-03-05 MED ORDER — OMEGA-3-ACID ETHYL ESTERS 1 G PO CAPS
1000.0000 mg | ORAL_CAPSULE | Freq: Every day | ORAL | Status: DC
  Administered 2021-03-06 – 2021-03-14 (×9): 1000 mg via ORAL
  Filled 2021-03-05 (×9): qty 1

## 2021-03-05 MED ORDER — VITAMIN D 25 MCG (1000 UNIT) PO TABS
5000.0000 [IU] | ORAL_TABLET | Freq: Every day | ORAL | Status: DC
  Administered 2021-03-06 – 2021-03-14 (×9): 5000 [IU] via ORAL
  Filled 2021-03-05 (×9): qty 5

## 2021-03-05 MED ORDER — ACETAMINOPHEN 650 MG RE SUPP: 650.0000 mg | RECTAL | Status: DC | PRN

## 2021-03-05 MED ORDER — LABETALOL HCL 5 MG/ML IV SOLN
10.0000 mg | Freq: Once | INTRAVENOUS | Status: AC
  Administered 2021-03-05: 10 mg via INTRAVENOUS
  Filled 2021-03-05: qty 4

## 2021-03-05 NOTE — ED Triage Notes (Signed)
Pt arrives via [facility] for c/o blurry vision and HTN starting last night at 0100. Pt reports symptoms similar to previous TIA. Pt A+Ox4 on arrival. During transport pt having SBP 260 consistently. Recently changed from losartan to HCTZ for BP control.   EMS last VS - HR 78, BP 220/140, RR 18, 98% on RA, CBG 149, Temp 98

## 2021-03-05 NOTE — ED Provider Notes (Addendum)
[facility] EMERGENCY DEPARTMENT Provider Note   CSN: [id number] Arrival date & time: 03/05/21  1738     History Chief Complaint  Patient presents with   Hypertension   Blurred Vision    Jade [name] is a 65 y.o. female.  Patient here with vision issues, high blood pressure.  She states that 1 AM yesterday she just had a hard time focusing with her eyes may be having some blurred vision.  Denies any visual field loss or headaches.  Has had recent difficulty managing her blood pressure.  Has a history of stroke.  Is on aspirin but no blood thinners.  The history is provided by the patient.  Hypertension This is a chronic problem. The problem occurs constantly. The problem has not changed since onset.Pertinent negatives include no chest pain, no abdominal pain, no headaches and no shortness of breath. Nothing aggravates the symptoms. Nothing relieves the symptoms. She has tried nothing for the symptoms. The treatment provided no relief.      Past Medical History:  Diagnosis Date   Arthritis    "back, knees, some in my left hip" (03/21/2018)   Chronic kidney disease    "was told I had 25% kidney function in early 2012" (03/21/2018)   COPD (chronic obstructive pulmonary disease) (HCC)    CVA (cerebral vascular accident) (HCC) 03/21/2018   "numb all over; head to toe" (03/21/2018)   Fibromyalgia    History of gout    Hyperlipidemia    Hypertension    Migraine    "used to get them monthly during menopause" (03/21/2018)   Neuromuscular disorder (HCC)    OSA (obstructive sleep apnea)    "don't tolerate the machine" (03/21/2018)   Pneumonia ~ 2007   Type 2 diabetes, diet controlled (HCC)     Patient Active Problem List   Diagnosis Date Noted   Stroke-like symptoms 03/05/2021   Acute CVA (cerebrovascular accident) (HCC) 03/21/2018   Obesity, Class III, BMI 40-49.9 (morbid obesity) (HCC) 03/21/2018   IBS (irritable bowel syndrome) 04/12/2015   Hair loss  10/20/2014   Primary osteoarthritis of left knee 09/29/2014   Visit for screening mammogram 08/30/2014   Routine general medical examination at a health care facility 08/30/2014   Tinea corporis 12/21/2013   Essential hypertension, benign 12/21/2013   Low back pain 08/24/2013   Patient noncompliant with statin medication 02/23/2013   H/O abnormal Pap smear 10/24/2012   Osteopenia 09/26/2012   Diabetic neuropathy, painful (HCC) 05/26/2012   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   Pure hypercholesterolemia 03/20/2012   Chronic venous insufficiency 03/20/2012    Past Surgical History:  Procedure Laterality Date   HERNIA REPAIR     LAPAROSCOPIC CHOLECYSTECTOMY  06/2007   "no UHR w/this" (03/21/2018)   TONSILLECTOMY     UMBILICAL HERNIA REPAIR  2009     OB History     Gravida  3   Para  1   Term  1   Preterm      AB  2   Living  1      SAB  2   IAB      Ectopic      Multiple      Live Births  1           Family History  Problem Relation Age of Onset   Heart disease Father    Leukemia Father    Alcohol abuse Son    Other Mother  Cancer Neg Hx    Diabetes Neg Hx    Hearing loss Neg Hx    Hyperlipidemia Neg Hx    Hypertension Neg Hx    Kidney disease Neg Hx    Stroke Neg Hx     Social History   Tobacco Use   Smoking status: Former    Packs/day: 1.25    Years: 16.00    Pack years: 20.00    Types: Cigarettes    Quit date: 08/28/1987    Years since quitting: 33.5   Smokeless tobacco: Never  Vaping Use   Vaping Use: Never used  Substance Use Topics   Alcohol use: Not Currently   Drug use: Not Currently    Types: Marijuana    Home Medications Prior to Admission medications   Medication Sig Start Date End Date Taking? Authorizing Provider  Aspirin 325 MG CAPS Take 325 mg by mouth daily.   Yes [provider]  b complex vitamins capsule Take 1 capsule by mouth daily.   Yes [provider]  Cholecalciferol (VITAMIN D-3) 5000 UNITS TABS Take 5,000 Units by mouth daily.    Yes [provider]  Coenzyme Q10 (COQ-10) 100 MG CAPS Take 100 mg by mouth daily.   Yes [provider]  hydrochlorothiazide (MICROZIDE) 12.5 MG capsule Take 1 capsule (12.5 mg total) by mouth daily. 02/17/21 02/17/22 Yes Caryl Ada K, PA-C  magnesium gluconate (MAGONATE) 500 MG tablet Take 500 mg by mouth daily.   Yes [provider]  Menaquinone-7 (VITAMIN K2 PO) Take 1 tablet by mouth daily.   Yes [provider]  Omega-3 Fatty Acids (FISH OIL) 600 MG CAPS Take 600 mg by mouth 3 (three) times daily.    Yes [provider]  PRESCRIPTION MEDICATION Place 1 Dose into the left eye every 30 (thirty) days. Patient not sure about name of prescription - for the fluid filled bubble on the retina of left eye   Yes [provider]  Blood Glucose Calibration (ACCU-CHEK SMARTVIEW CONTROL) LIQD Test up to TID dx:E11.40 09/13/14   Janith Lima, MD  Blood Glucose Monitoring Suppl (ACCU-CHEK NANO SMARTVIEW) W/DEVICE KIT Test up to TID dx:E11.40 09/13/14   Janith Lima, MD  glucose blood (ACCU-CHEK SMARTVIEW) test strip Test up to TID dx:E11.40 09/13/14   Janith Lima, MD  Lancets Misc. (ACCU-CHEK FASTCLIX LANCET) KIT Test up to TID dx:E11.40 09/13/14   Janith Lima, MD    Allergies    Nickel  Review of Systems   Review of Systems  Constitutional:  Negative for chills and fever.  HENT:  Negative for ear pain and sore throat.   Eyes:  Positive for visual disturbance. Negative for photophobia, pain, discharge, redness and itching.  Respiratory:  Negative for cough and shortness of breath.   Cardiovascular:  Negative for chest pain and palpitations.  Gastrointestinal:  Negative for abdominal pain and vomiting.  Genitourinary:  Negative for dysuria and hematuria.  Musculoskeletal:  Negative for arthralgias and back pain.  Skin:  Negative for color change and  rash.  Neurological:  Negative for seizures, syncope and headaches.  All other systems reviewed and are negative.  Physical Exam Updated Vital Signs  ED Triage Vitals  Enc Vitals Group     BP 03/05/21 1742 (!) 226/146     Pulse Rate 03/05/21 1742 91     Resp 03/05/21 1742 20     Temp 03/05/21 1742 98.3 F (36.8 C)  Temp Source 03/05/21 1742 Oral     SpO2 03/05/21 1741 98 %     Weight 03/05/21 1744 171 lb (77.6 kg)     Height 03/05/21 1744 $RemoveBefor'5\' 1"'RtbuQcVXLasL$  (1.549 m)     Head Circumference --      Peak Flow --      Pain Score 03/05/21 1743 0     Pain Loc --      Pain Edu? --      Excl. in [place]? --     Physical Exam Vitals and nursing note reviewed.  Constitutional:      General: She is not in acute distress.    Appearance: She is well-developed. She is not ill-appearing.  HENT:     Head: Normocephalic and atraumatic.     Mouth/Throat:     Mouth: Mucous membranes are moist.  Eyes:     Conjunctiva/sclera: Conjunctivae normal.     Pupils: Pupils are equal, round, and reactive to light.     Comments: Patient appears to have bilateral upward gaze palsy but is able to look to the left into the right and down while follow my finger, there is no visual field deficit, she appears to have 20/70 vision in each eye  Cardiovascular:     Rate and Rhythm: Normal rate and regular rhythm.     Pulses: Normal pulses.     Heart sounds: Normal heart sounds. No murmur heard. Pulmonary:     Effort: Pulmonary effort is normal. No respiratory distress.     Breath sounds: Normal breath sounds.  Abdominal:     Palpations: Abdomen is soft.     Tenderness: There is no abdominal tenderness.  Musculoskeletal:     Cervical back: Normal range of motion and neck supple. No tenderness.  Skin:    General: Skin is warm and dry.     Capillary Refill: Capillary refill takes less than 2 seconds.  Neurological:     General: No focal deficit present.     Mental Status: She is alert and oriented to person, place,  and time.     Sensory: Sensory deficit (numbness to bilateral feet but baseline, otherwise sensation intact grossly) present.     Motor: No weakness.     Coordination: Coordination normal.     Gait: Gait normal.  Psychiatric:        Mood and Affect: Mood normal.    ED Results / Procedures / Treatments   Labs (all labs ordered are listed, but only abnormal results are displayed) Labs Reviewed  CBC WITH DIFFERENTIAL/PLATELET - Abnormal; Notable for the following components:      Result Value   Hemoglobin 15.6 (*)    HCT 47.3 (*)    All other components within normal limits  COMPREHENSIVE METABOLIC PANEL - Abnormal; Notable for the following components:   Glucose, Bld 147 (*)    Creatinine, Ser 1.10 (*)    GFR, Estimated 55 (*)    All other components within normal limits  RESP PANEL BY RT-PCR (FLU A&B, COVID) ARPGX2  HIV ANTIBODY (ROUTINE TESTING W REFLEX)  HEMOGLOBIN A1C  LIPID PANEL  TROPONIN I (HIGH SENSITIVITY)    EKG EKG Interpretation  Date/Time:  Sunday March 05 2021 17:44:34 EDT Ventricular Rate:  78 PR Interval:  158 QRS Duration: 112 QT Interval:  390 QTC Calculation: 445 R Axis:   -66 Text Interpretation: Sinus rhythm Abnormal R-wave progression, late transition LVH with IVCD, LAD and secondary repol abnrm Artifact in lead(s) I II aVR  aVL Confirmed by Lennice Sites [phone]) on 03/05/2021 6:40:08 PM  Radiology CT Head Wo Contrast  Result Date: 03/05/2021 CLINICAL DATA:  Revision hypertension EXAM: CT HEAD WITHOUT CONTRAST TECHNIQUE: Contiguous axial images were obtained from the base of the skull through the vertex without intravenous contrast. COMPARISON:  CT and MRI 08/21/2018 FINDINGS: Brain: Interval increase in the moderate periventricular and subcortical chronic microvascular white matter ischemic disease. Similar advanced for age moderate parenchymal volume loss. No evidence of acute large vascular territory infarction, hemorrhage, hydrocephalus, extra-axial  collection or mass lesion/mass effect. Vascular: No hyperdense vessel. Atherosclerotic calcifications of the internal carotid arteries at skull base. Skull: No acute abnormality. Sinuses/Orbits: The visualized portions of the paranasal sinuses and mastoid air cells are predominantly clear. Other: None IMPRESSION: 1. No acute intracranial abnormality. 2. Slight interval increase in the moderate burden of chronic microvascular white matter ischemic disease. 3. Similar advanced for age moderate parenchymal volume loss. Electronically Signed   By: Dahlia Bailiff MD   On: 03/05/2021 19:53   DG Chest Portable 1 View  Result Date: 03/05/2021 CLINICAL DATA:  Hypertension EXAM: PORTABLE CHEST 1 VIEW COMPARISON:  Radiograph 04/05/2015 FINDINGS: Some coarsened reticular changes are noted in the bilateral apices, right greater than left though may be accentuated by patient's exaggerated thoracic kyphotic curvature. Some additional bandlike opacity in the right mid lung and left lung base could reflect atelectasis or scarring. No focal consolidation. No convincing features of edema. No pneumothorax or layering pleural effusion. Calcified aorta. Cardiomediastinal contours are otherwise unremarkable for positioning and portable technique. Telemetry leads overlie the chest. IMPRESSION: 1. Increasing coarse reticular opacities noted towards the lung apices, possibly accentuated by positioning and technique though can reflect acute or chronic infection or inflammatory change. 2. More bandlike opacities in the mid lungs, likely atelectasis or scarring. 3.  Aortic Atherosclerosis (ICD10-I70.0). Electronically Signed   By: Lovena Le M.D.   On: 03/05/2021 20:29    Procedures .Critical Care  Date/Time: 03/05/2021 9:07 PM Performed by: Lennice Sites, DO Authorized by: Lennice Sites, DO   Critical care provider statement:    Critical care time (minutes):  35   Critical care was necessary to treat or prevent imminent or  life-threatening deterioration of the following conditions:  CNS failure or compromise   Critical care was time spent personally by me on the following activities:  Blood draw for specimens, development of treatment plan with patient or surrogate, discussions with primary provider, discussions with consultants, evaluation of patient's response to treatment, examination of patient, obtaining history from patient or surrogate, ordering and performing treatments and interventions, ordering and review of laboratory studies and review of old charts   I assumed direction of critical care for this patient from another provider in my specialty: no     Care discussed with: admitting provider     Medications Ordered in ED Medications  Aspirin CAPS 325 mg (has no administration in time range)  Vitamin D-3 TABS 5,000 Units (has no administration in time range)  Fish Oil CAPS 600 mg (has no administration in time range)   stroke: mapping our early stages of recovery book (has no administration in time range)  acetaminophen (TYLENOL) tablet 650 mg (has no administration in time range)    Or  acetaminophen (TYLENOL) 160 MG/5ML solution 650 mg (has no administration in time range)    Or  acetaminophen (TYLENOL) suppository 650 mg (has no administration in time range)  enoxaparin (LOVENOX) injection 40 mg (has no administration in time  range)  labetalol (NORMODYNE) injection 10 mg (10 mg Intravenous Given 03/05/21 1934)    ED Course  I have reviewed the triage vital signs and the nursing notes.  Pertinent labs & imaging results that were available during my care of the patient were reviewed by me and considered in my medical decision making (see chart for details).    MDM Rules/Calculators/A&P                          JOZETTE CASTRELLON is a 65 year old female with history of COPD, high cholesterol, hypertension, stroke on aspirin who presents to the ED with high blood pressure, vision issues.  Patient  with blood pressure of 230s upon arrival.  She states since 1 AM last night she has been having trouble focusing with her eyes.  Denies any headache, chest pain.  She does state that she has diabetic retinopathy and sometimes gets injections in her eyes.  She denies any eye pain or visual field cut.  Overall on neuro exam she has some numbness in her feet which appears to be chronic and for neuropathy.  Overall neuro exam is unremarkable except for that she has palsy on upward gaze of both of her eyes.  Talked with neurology on the phone and they recommend as needed pushes of high blood pressure medicine to achieve about 20% reduction and if her systolic blood pressure and can be more aggressive after ruling out a stroke.  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  Patient was given a dose of IV labetalol and blood pressure improved to 170/82.  CT scan of the head showed no head bleed.  Lab work was overall unremarkable.  To be admitted to medicine for further work-up.  This chart was dictated using voice recognition software.  Despite best efforts to proofread,  errors can occur which can change the documentation meaning.   Final Clinical Impression(s) / ED Diagnoses Final diagnoses:  Hypertensive emergency  Bilateral ocular palsy    Rx / DC Orders ED Discharge Orders     None        Lennice Sites, DO 03/05/21 2106    Lennice Sites, DO 03/05/21 2108

## 2021-03-05 NOTE — ED Notes (Signed)
The pt very sluggish slow to attempt to answer questions   Grips equal  Speech clear after she gets  Awake   Some commands she just does not attempt to answer

## 2021-03-05 NOTE — H&P (Signed)
History and Physical    Jade [name] RWE:[id number] DOB: 1956/02/10 DOA: 03/05/2021  PCP: System, Provider Not In  Patient coming from: Home  I have personally briefly reviewed patient's old medical records in [place]  Chief Complaint: HTN, blurred vision  HPI: Jade [name] is a 65 y.o. female with medical history significant of HTN, prior stroke, diet controlled DM2, HLD refuses statins.  Pt presents to ED with c/o blurred vision: difficulty focusing eyes onset at 1AM.  Symptoms persistent.  Nothing makes better or worse.  Associated with very high blood pressures.  No visual field loss nor headaches.   ED Course: BPs with EMS reported as high as 260.  In ED BP running 619-509 systolic initially, improved after labetalol.   Review of Systems: As per HPI, otherwise all review of systems negative.  Past Medical History:  Diagnosis Date   Arthritis    "back, knees, some in my left hip" (03/21/2018)   Chronic kidney disease    "was told I had 25% kidney function in early 2012" (03/21/2018)   COPD (chronic obstructive pulmonary disease) (HCC)    CVA (cerebral vascular accident) ([place]) 03/21/2018   "numb all over; head to toe" (03/21/2018)   Fibromyalgia    History of gout    Hyperlipidemia    Hypertension    Migraine    "used to get them monthly during menopause" (03/21/2018)   Neuromuscular disorder ([place])    OSA (obstructive sleep apnea)    "don't tolerate the machine" (03/21/2018)   Pneumonia ~ 2007   Type 2 diabetes, diet controlled ([place])     Past Surgical History:  Procedure Laterality Date   HERNIA REPAIR     LAPAROSCOPIC CHOLECYSTECTOMY  06/2007   "no UHR w/this" (03/21/2018)   TONSILLECTOMY     UMBILICAL HERNIA REPAIR  2009     reports that she quit smoking about 33 years ago. Her smoking use included cigarettes. She has a 20.00 pack-year smoking history. She has never used smokeless tobacco. She reports previous alcohol use. She reports  previous drug use. Drug: Marijuana.  Allergies  Allergen Reactions   Nickel Dermatitis and Rash    Family History  Problem Relation Age of Onset   Heart disease Father    Leukemia Father    Alcohol abuse Son    Other Mother    Cancer Neg Hx    Diabetes Neg Hx    Hearing loss Neg Hx    Hyperlipidemia Neg Hx    Hypertension Neg Hx    Kidney disease Neg Hx    Stroke Neg Hx      Prior to Admission medications   Medication Sig Start Date End Date Taking? Authorizing Provider  Aspirin 325 MG CAPS Take 325 mg by mouth daily.   Yes [provider]  b complex vitamins capsule Take 1 capsule by mouth daily.   Yes [provider]  Cholecalciferol (VITAMIN D-3) 5000 UNITS TABS Take 5,000 Units by mouth daily.    Yes [provider]  Coenzyme Q10 (COQ-10) 100 MG CAPS Take 100 mg by mouth daily.   Yes [provider]  hydrochlorothiazide (MICROZIDE) 12.5 MG capsule Take 1 capsule (12.5 mg total) by mouth daily. 02/17/21 02/17/22 Yes Caryl Ada K, PA-C  magnesium gluconate (MAGONATE) 500 MG tablet Take 500 mg by mouth daily.   Yes [provider]  Menaquinone-7 (VITAMIN K2 PO) Take 1 tablet by mouth daily.   Yes [provider]  Omega-3  Fatty Acids (FISH OIL) 600 MG CAPS Take 600 mg by mouth 3 (three) times daily.    Yes [provider]  PRESCRIPTION MEDICATION Place 1 Dose into the left eye every 30 (thirty) days. Patient not sure about name of prescription - for the fluid filled bubble on the retina of left eye   Yes [provider]  Blood Glucose Calibration (ACCU-CHEK SMARTVIEW CONTROL) LIQD Test up to TID dx:E11.40 09/13/14   Janith Lima, MD  Blood Glucose Monitoring Suppl (ACCU-CHEK NANO SMARTVIEW) W/DEVICE KIT Test up to TID dx:E11.40 09/13/14   Janith Lima, MD  glucose blood (ACCU-CHEK SMARTVIEW) test strip Test up to TID dx:E11.40 09/13/14   Janith Lima, MD  Lancets Misc. (ACCU-CHEK FASTCLIX LANCET) KIT  Test up to TID dx:E11.40 09/13/14   Janith Lima, MD    Physical Exam: Vitals:   03/05/21 2000 03/05/21 2015 03/05/21 2030 03/05/21 2045  BP: (!) 182/76 (!) 194/83 (!) 193/93 (!) 165/82  Pulse: (!) 51 (!) 52 (!) 57 (!) 55  Resp: 15 16 (!) 21 13  Temp:      TempSrc:      SpO2: 97% 97% 97% 95%  Weight:      Height:        Constitutional: NAD, calm, comfortable Eyes: lids and conjunctivae normal ENMT: Mucous membranes are moist. Posterior pharynx clear of any exudate or lesions.Normal dentition.  Neck: normal, supple, no masses, no thyromegaly Respiratory: clear to auscultation bilaterally, no wheezing, no crackles. Normal respiratory effort. No accessory muscle use.  Cardiovascular: Regular rate and rhythm, no murmurs / rubs / gallops. No extremity edema. 2+ pedal pulses. No carotid bruits.  Abdomen: no tenderness, no masses palpated. No hepatosplenomegaly. Bowel sounds positive.  Musculoskeletal: no clubbing / cyanosis. No joint deformity upper and lower extremities. Good ROM, no contractures. Normal muscle tone.  Skin: no rashes, lesions, ulcers. No induration Neurologic: Patient with B vertical gaze palsy, unable to look up or down with both eyes, some nystagmus when trying. Psychiatric: Normal judgment and insight. Alert and oriented x 3. Normal mood.    Labs on Admission: I have personally reviewed following labs and imaging studies  CBC: Recent Labs  Lab 03/05/21 1800  WBC 8.0  NEUTROABS 5.5  HGB 15.6*  HCT 47.3*  MCV 96.3  PLT 747   Basic Metabolic Panel: Recent Labs  Lab 03/05/21 1800  NA 138  K 3.8  CL 104  CO2 25  GLUCOSE 147*  BUN 21  CREATININE 1.10*  CALCIUM 9.7   GFR: Estimated Creatinine Clearance: 47.4 mL/min (A) (by C-G formula based on SCr of 1.1 mg/dL (H)). Liver Function Tests: Recent Labs  Lab 03/05/21 1800  AST 23  ALT 13  ALKPHOS 77  BILITOT 1.1  PROT 7.1  ALBUMIN 4.1   No results for input(s): LIPASE, AMYLASE in the last 168  hours. No results for input(s): AMMONIA in the last 168 hours. Coagulation Profile: No results for input(s): INR, PROTIME in the last 168 hours. Cardiac Enzymes: No results for input(s): CKTOTAL, CKMB, CKMBINDEX, TROPONINI in the last 168 hours. BNP (last 3 results) No results for input(s): PROBNP in the last 8760 hours. HbA1C: No results for input(s): HGBA1C in the last 72 hours. CBG: No results for input(s): GLUCAP in the last 168 hours. Lipid Profile: No results for input(s): CHOL, HDL, LDLCALC, TRIG, CHOLHDL, LDLDIRECT in the last 72 hours. Thyroid Function Tests: No results for input(s): TSH, T4TOTAL, FREET4, T3FREE, THYROIDAB in the  last 72 hours. Anemia Panel: No results for input(s): VITAMINB12, FOLATE, FERRITIN, TIBC, IRON, RETICCTPCT in the last 72 hours. Urine analysis:    Component Value Date/Time   COLORURINE YELLOW 03/08/2015 0855   APPEARANCEUR CLEAR 03/08/2015 0855   LABSPEC 1.015 03/08/2015 0855   PHURINE 6.0 03/08/2015 0855   GLUCOSEU NEGATIVE 03/08/2015 0855   HGBUR NEGATIVE 03/08/2015 0855   BILIRUBINUR NEGATIVE 03/08/2015 0855   KETONESUR NEGATIVE 03/08/2015 0855   PROTEINUR 30 (A) 06/16/2010 2255   UROBILINOGEN 0.2 03/08/2015 0855   NITRITE NEGATIVE 03/08/2015 0855   LEUKOCYTESUR NEGATIVE 03/08/2015 0855    Radiological Exams on Admission: CT Head Wo Contrast  Result Date: 03/05/2021 CLINICAL DATA:  Revision hypertension EXAM: CT HEAD WITHOUT CONTRAST TECHNIQUE: Contiguous axial images were obtained from the base of the skull through the vertex without intravenous contrast. COMPARISON:  CT and MRI 08/21/2018 FINDINGS: Brain: Interval increase in the moderate periventricular and subcortical chronic microvascular white matter ischemic disease. Similar advanced for age moderate parenchymal volume loss. No evidence of acute large vascular territory infarction, hemorrhage, hydrocephalus, extra-axial collection or mass lesion/mass effect. Vascular: No hyperdense  vessel. Atherosclerotic calcifications of the internal carotid arteries at skull base. Skull: No acute abnormality. Sinuses/Orbits: The visualized portions of the paranasal sinuses and mastoid air cells are predominantly clear. Other: None IMPRESSION: 1. No acute intracranial abnormality. 2. Slight interval increase in the moderate burden of chronic microvascular white matter ischemic disease. 3. Similar advanced for age moderate parenchymal volume loss. Electronically Signed   By: Dahlia Bailiff MD   On: 03/05/2021 19:53   DG Chest Portable 1 View  Result Date: 03/05/2021 CLINICAL DATA:  Hypertension EXAM: PORTABLE CHEST 1 VIEW COMPARISON:  Radiograph 04/05/2015 FINDINGS: Some coarsened reticular changes are noted in the bilateral apices, right greater than left though may be accentuated by patient's exaggerated thoracic kyphotic curvature. Some additional bandlike opacity in the right mid lung and left lung base could reflect atelectasis or scarring. No focal consolidation. No convincing features of edema. No pneumothorax or layering pleural effusion. Calcified aorta. Cardiomediastinal contours are otherwise unremarkable for positioning and portable technique. Telemetry leads overlie the chest. IMPRESSION: 1. Increasing coarse reticular opacities noted towards the lung apices, possibly accentuated by positioning and technique though can reflect acute or chronic infection or inflammatory change. 2. More bandlike opacities in the mid lungs, likely atelectasis or scarring. 3.  Aortic Atherosclerosis (ICD10-I70.0). Electronically Signed   By: Lovena Le M.D.   On: 03/05/2021 20:29    EKG: Independently reviewed.  Assessment/Plan Principal Problem:   Stroke-like symptoms Active Problems:   Diabetes mellitus type 2 with neurological manifestations ([place])   Pure hypercholesterolemia   Patient noncompliant with statin medication   Essential hypertension, benign    New onset B vertical gaze palsy  - Worrisome for midbrain stroke (or possibly B thalamus stroke according to Dr. Lorrin Goodell) Getting MRI brain Stroke pathway Cont home ASA 325 Pure hypercholesterolemia - Cont fish oil Pt very anti-statin, doesn't want to discuss right now Getting MRI, if stroke confirmed, try to re-discuss HLD meds with pt. HTN - Got 83m labetalol in ED BP goals: < 220/110 if stroke confirmed on MRI (permissive HTN) 20% drop in first 24H if no stroke on MRI DM2 - Diet controlled  DVT prophylaxis: Lovenox Code Status: Full Family Communication: No family in room Disposition Plan: Home after stroke work up [employer]: Neuro [place] [facility]  How to  contact the [facility] Attending or Consulting provider [place] or covering provider during after hours [place], for this patient?  Check the care team in [facility] and look for a) attending/consulting [facility] provider listed and b) the [facility] team listed Log into [email or web address]  Amion Physician Scheduling and messaging for groups and whole hospitals  On call and physician scheduling software for group practices, residents, hospitalists and other medical providers for call, clinic, rotation and shift schedules. On[employer] is a hospital-wide system for scheduling doctors and paging doctors on call. EasyPlot is for scientific plotting and data analysis.  [email or web address]  and use [place]'s universal password to access. If you do not have the password, please contact the hospital operator.  Locate the [facility] provider you are looking for under [facility] and page to a number that you can be directly reached. If you still have difficulty reaching the provider, please page the [facility] (Director on Call) for the Hospitalists listed on amion for assistance.  03/05/2021, 8:52 PM

## 2021-03-05 NOTE — ED Notes (Signed)
RN did stoke screening. Pt had perfect vision with one eye shut at a time but blurred vision with both eyes. MD notified.

## 2021-03-06 ENCOUNTER — Inpatient Hospital Stay (HOSPITAL_COMMUNITY): Payer: Medicare Other

## 2021-03-06 ENCOUNTER — Observation Stay (HOSPITAL_COMMUNITY): Payer: Medicare Other

## 2021-03-06 DIAGNOSIS — I161 Hypertensive emergency: Secondary | ICD-10-CM | POA: Diagnosis present

## 2021-03-06 DIAGNOSIS — I6522 Occlusion and stenosis of left carotid artery: Secondary | ICD-10-CM | POA: Diagnosis present

## 2021-03-06 DIAGNOSIS — R5381 Other malaise: Secondary | ICD-10-CM

## 2021-03-06 DIAGNOSIS — Z87891 Personal history of nicotine dependence: Secondary | ICD-10-CM | POA: Diagnosis not present

## 2021-03-06 DIAGNOSIS — Z9049 Acquired absence of other specified parts of digestive tract: Secondary | ICD-10-CM | POA: Diagnosis not present

## 2021-03-06 DIAGNOSIS — E1149 Type 2 diabetes mellitus with other diabetic neurological complication: Secondary | ICD-10-CM | POA: Diagnosis present

## 2021-03-06 DIAGNOSIS — I10 Essential (primary) hypertension: Secondary | ICD-10-CM | POA: Diagnosis not present

## 2021-03-06 DIAGNOSIS — E669 Obesity, unspecified: Secondary | ICD-10-CM | POA: Diagnosis present

## 2021-03-06 DIAGNOSIS — I129 Hypertensive chronic kidney disease with stage 1 through stage 4 chronic kidney disease, or unspecified chronic kidney disease: Secondary | ICD-10-CM | POA: Diagnosis present

## 2021-03-06 DIAGNOSIS — Z7982 Long term (current) use of aspirin: Secondary | ICD-10-CM | POA: Diagnosis not present

## 2021-03-06 DIAGNOSIS — I6381 Other cerebral infarction due to occlusion or stenosis of small artery: Secondary | ICD-10-CM | POA: Diagnosis present

## 2021-03-06 DIAGNOSIS — G35 Multiple sclerosis: Secondary | ICD-10-CM | POA: Diagnosis present

## 2021-03-06 DIAGNOSIS — E876 Hypokalemia: Secondary | ICD-10-CM | POA: Diagnosis present

## 2021-03-06 DIAGNOSIS — I6389 Other cerebral infarction: Secondary | ICD-10-CM

## 2021-03-06 DIAGNOSIS — I639 Cerebral infarction, unspecified: Secondary | ICD-10-CM | POA: Diagnosis not present

## 2021-03-06 DIAGNOSIS — Z806 Family history of leukemia: Secondary | ICD-10-CM | POA: Diagnosis not present

## 2021-03-06 DIAGNOSIS — Z9119 Patient's noncompliance with other medical treatment and regimen: Secondary | ICD-10-CM | POA: Diagnosis not present

## 2021-03-06 DIAGNOSIS — E1122 Type 2 diabetes mellitus with diabetic chronic kidney disease: Secondary | ICD-10-CM | POA: Diagnosis present

## 2021-03-06 DIAGNOSIS — E7800 Pure hypercholesterolemia, unspecified: Secondary | ICD-10-CM | POA: Diagnosis present

## 2021-03-06 DIAGNOSIS — H510 Palsy (spasm) of conjugate gaze: Secondary | ICD-10-CM | POA: Diagnosis present

## 2021-03-06 DIAGNOSIS — Z79899 Other long term (current) drug therapy: Secondary | ICD-10-CM | POA: Diagnosis not present

## 2021-03-06 DIAGNOSIS — H499 Unspecified paralytic strabismus: Secondary | ICD-10-CM | POA: Diagnosis not present

## 2021-03-06 DIAGNOSIS — J449 Chronic obstructive pulmonary disease, unspecified: Secondary | ICD-10-CM | POA: Diagnosis present

## 2021-03-06 DIAGNOSIS — Z20822 Contact with and (suspected) exposure to covid-19: Secondary | ICD-10-CM | POA: Diagnosis present

## 2021-03-06 DIAGNOSIS — Z6832 Body mass index (BMI) 32.0-32.9, adult: Secondary | ICD-10-CM | POA: Diagnosis not present

## 2021-03-06 DIAGNOSIS — N1831 Chronic kidney disease, stage 3a: Secondary | ICD-10-CM | POA: Diagnosis present

## 2021-03-06 DIAGNOSIS — E11319 Type 2 diabetes mellitus with unspecified diabetic retinopathy without macular edema: Secondary | ICD-10-CM | POA: Diagnosis present

## 2021-03-06 DIAGNOSIS — M797 Fibromyalgia: Secondary | ICD-10-CM | POA: Diagnosis present

## 2021-03-06 DIAGNOSIS — Z8249 Family history of ischemic heart disease and other diseases of the circulatory system: Secondary | ICD-10-CM | POA: Diagnosis not present

## 2021-03-06 LAB — LIPID PANEL
Cholesterol: 271 mg/dL — ABNORMAL HIGH (ref 0–200)
HDL: 59 mg/dL (ref >40)
LDL Cholesterol: 184 mg/dL — ABNORMAL HIGH (ref 0–99)
Total CHOL/HDL Ratio: 4.6 RATIO
Triglycerides: 141 mg/dL (ref <150)
VLDL: 28 mg/dL (ref 0–40)

## 2021-03-06 LAB — CBG MONITORING, ED
Glucose-Capillary: 138 mg/dL — ABNORMAL HIGH (ref 70–99)
Glucose-Capillary: 136 mg/dL — ABNORMAL HIGH (ref 70–99)
Glucose-Capillary: 214 mg/dL — ABNORMAL HIGH (ref 70–99)
Glucose-Capillary: 124 mg/dL — ABNORMAL HIGH (ref 70–99)

## 2021-03-06 LAB — HIV ANTIBODY (ROUTINE TESTING W REFLEX): HIV Screen 4th Generation wRfx: NONREACTIVE

## 2021-03-06 LAB — ECHOCARDIOGRAM COMPLETE
Area-P 1/2: 2.01 cm2
Height: 61 in
S' Lateral: 2.8 cm
Weight: 2736 oz

## 2021-03-06 LAB — HEMOGLOBIN A1C
Hgb A1c MFr Bld: 6.4 % — ABNORMAL HIGH (ref 4.8–5.6)
Mean Plasma Glucose: 136.98 mg/dL

## 2021-03-06 LAB — GLUCOSE, CAPILLARY: Glucose-Capillary: 112 mg/dL — ABNORMAL HIGH (ref 70–99)

## 2021-03-06 LAB — COLOGUARD: COLOGUARD: NEGATIVE

## 2021-03-06 MED ORDER — INSULIN ASPART 100 UNIT/ML IJ SOLN
0.0000 [IU] | Freq: Every day | INTRAMUSCULAR | Status: DC
  Administered 2021-03-07 – 2021-03-11 (×3): 2 [IU] via SUBCUTANEOUS

## 2021-03-06 MED ORDER — ATORVASTATIN CALCIUM 80 MG PO TABS
80.0000 mg | ORAL_TABLET | Freq: Every day | ORAL | Status: DC
  Administered 2021-03-06 – 2021-03-14 (×9): 80 mg via ORAL
  Filled 2021-03-06 (×5): qty 1
  Filled 2021-03-06: qty 2
  Filled 2021-03-06 (×3): qty 1

## 2021-03-06 MED ORDER — IOHEXOL 350 MG/ML SOLN
75.0000 mL | Freq: Once | INTRAVENOUS | Status: AC | PRN
  Administered 2021-03-06: 75 mL via INTRAVENOUS

## 2021-03-06 MED ORDER — INSULIN ASPART 100 UNIT/ML IJ SOLN
0.0000 [IU] | Freq: Three times a day (TID) | INTRAMUSCULAR | Status: DC
  Administered 2021-03-06: 3 [IU] via SUBCUTANEOUS
  Administered 2021-03-06 – 2021-03-07 (×3): 1 [IU] via SUBCUTANEOUS
  Administered 2021-03-07: 2 [IU] via SUBCUTANEOUS
  Administered 2021-03-08: 1 [IU] via SUBCUTANEOUS
  Administered 2021-03-08 – 2021-03-10 (×3): 2 [IU] via SUBCUTANEOUS
  Administered 2021-03-11: 1 [IU] via SUBCUTANEOUS
  Administered 2021-03-11: 2 [IU] via SUBCUTANEOUS
  Administered 2021-03-12: 3 [IU] via SUBCUTANEOUS
  Administered 2021-03-12 – 2021-03-13 (×4): 1 [IU] via SUBCUTANEOUS
  Administered 2021-03-14: 2 [IU] via SUBCUTANEOUS

## 2021-03-06 NOTE — ED Notes (Signed)
Pt transported to CT ?

## 2021-03-06 NOTE — ED Notes (Signed)
The pt is more alert now

## 2021-03-06 NOTE — ED Notes (Signed)
The pt vomited while she was in  Mri  As soon as she returned to her room  The pt was cleaned gown and linen changed  Washed up. Pure wick placed,  Pt alert

## 2021-03-06 NOTE — ED Notes (Signed)
Pt still in mri 

## 2021-03-06 NOTE — ED Notes (Signed)
The pt returned from mri  The pt vomited in mri

## 2021-03-06 NOTE — ED Notes (Signed)
Pt still using bedside commode.

## 2021-03-06 NOTE — ED Notes (Signed)
Complaining of dizziness  Water given she reports of asking for water for several hours  I was not aware and she had not asked me  Water given

## 2021-03-06 NOTE — ED Notes (Signed)
Pt assisted to bedside commode

## 2021-03-06 NOTE — Progress Notes (Signed)
PT Cancellation Note  Patient Details Name: Jade [name] MRN: [id number] DOB: [birth year]   Cancelled Treatment:    Reason Eval/Treat Not Completed: Other (comment) (Attempted at 1302, 1332, 1420 pt eating lunch each time. Will continue attempts)   Angelina Ok [facility] 03/06/2021, 2:25 PM Skip Mayer PT Acute Rehabilitation Services Pager [phone] Office [phone]

## 2021-03-06 NOTE — ED Notes (Signed)
Pt sl nauseated

## 2021-03-06 NOTE — Progress Notes (Signed)
Inpatient Rehab Admissions Coordinator Note:   Per therapy recommendations, pt was screened for CIR candidacy by Nevaeh Casillas, MS CCC-SLP. At this time, Pt. Appears to have functional decline and is a good candidate for CIR. Will place order for rehab consult per protocol.  Please contact me with questions.   Kihanna Kamiya, MS, CCC-SLP Rehab Admissions Coordinator  [phone] (celll) [phone] (office)  

## 2021-03-06 NOTE — Progress Notes (Signed)
  Echocardiogram 2D Echocardiogram has been performed.  Racquelle Hyser G Xaviera Flaten 03/06/2021, 10:34 AM

## 2021-03-06 NOTE — ED Notes (Signed)
bp elevated  

## 2021-03-06 NOTE — ED Notes (Signed)
Per Alanda Slim MD, allowing permissive HTN for pt w/ parameter of 220/110

## 2021-03-06 NOTE — ED Notes (Signed)
Attempted report x1. 

## 2021-03-06 NOTE — Progress Notes (Signed)
PROGRESS NOTE  Jade PealCathy T [name] GNF:[id number]:[id number] DOB: [birth year]   PCP: System, Provider Not In  Patient is from: Home.  Lives with husband.  Uses walker and cane at baseline.  DOA: 03/05/2021 LOS: 0  Chief complaints:  Chief Complaint  Patient presents with   Hypertension   Blurred Vision     Brief Narrative / Interim history: 65 year old F with PMH of CVA, diet-controlled DM-2, HTN,?COPD and HLD not on statin presenting with binocular blurry vision, and found to have markedly elevated BP with systolic to 260 and an acute left thalamic, anterior left frontal and possible right frontal and multiple remote lacunar CVA on MRI.  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  MRA neck was not performed due to patient's habitus.  Neurology consulted.   Subjective: Seen and examined earlier this morning.  No major events overnight of this morning.  Still with binocular blurry vision that resolves with covering one eye.  She reports 2 episodes of emesis last night.  She denies headache, chest pain, dyspnea, nausea, abdominal pain, focal weakness, numbness or tingling.  Denies UTI symptoms.  Objective: Vitals:   03/06/21 0620 03/06/21 0800 03/06/21 0845 03/06/21 0949  BP:  (!) 195/96 (!) 179/85 (!) 204/94  Pulse:  70 (!) 55 75  Resp:  17 17 18   Temp: 98.8 F (37.1 C)     TempSrc:      SpO2:  92% 92% 96%  Weight:      Height:       No intake or output data in the 24 hours ending 03/06/21 1119 Filed Weights   03/05/21 1744  Weight: 77.6 kg    Examination:  GENERAL: No apparent distress.  Nontoxic. HEENT: MMM.  Vision and hearing grossly intact.  NECK: Supple.  No apparent JVD.  RESP: On RA.  No IWOB.  Fair aeration bilaterally. CVS:  RRR. Heart sounds normal.  ABD/GI/GU: BS+. Abd soft, NTND.  MSK/EXT:  Moves extremities. No apparent deformity. No edema.  SKIN: no apparent skin lesion or wound NEURO: Sleepy but wakes to voice.  Oriented to self, place, year and person.  PERRL.   Seems to have vertical gaze palsy, Rt>Lt. PERRL.  No nystagmus.  No facial asymmetry.  Right pronator drift.  Finger-to-nose is slightly off on the right.  Motor, sensory and reflexes symmetric. PSYCH: Calm. Normal affect.   Procedures:  None  Microbiology summarized: COVID-19 and influenza PCR nonreactive.  Assessment & Plan: Acute CVA-presented with acute binocular blurry vision.  Seems to have vertical gaze palsy on exam.  Markedly elevated BP with systolic to 260 on presentation.  MRI brain with acute left thalamic, anterior left frontal and possible right frontal and multiple remote lacunar infarcts.  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  Was not able to do MRA neck due to body habitus.  A1c 6.4%.  LDL 184. -Neurology following -Needs either carotid ultrasound or CTA to assess neck vasculature. -Permissive hypertension given intracranial stenosis -Follow TTE -Start a statin. -Continue full dose aspirin pending neuro recommendation -PT/OT/SLP eval  Hypertensive emergency: Reportedly, SBP to 260 when checked by EMS.  On low-dose HCTZ at home.  BP improved. -Would benefit from permissive hypertension to 220/110 given intracranial stenosis  Hyperlipidemia -Start high intensity statin.   Prediabetes: A1c 6.4%. -Start SSI  Chronic COPD: Reports history of this but not on medication.  No respiratory distress. -As needed albuterol  Ambulatory dysfunction: Uses walker and cane at baseline. -PT/OT eval  Class I obesity Body mass index is 32.31 kg/m.         DVT prophylaxis:  enoxaparin (LOVENOX) injection 40 mg Start: 03/05/21 2100  Code Status: Full code Family Communication: Patient and/or RN.  Patient says her husband does not speak English, and she will update him Level of care: Telemetry Medical Status is: Observation  The patient will require care spanning > 2 midnights and should be moved to inpatient because: Hemodynamically unstable, Ongoing diagnostic  testing needed not appropriate for outpatient work up, Unsafe d/c plan, and Inpatient level of care appropriate due to severity of illness  Dispo: The patient is from: Home              Anticipated d/c is to:  To be determined              Patient currently is not medically stable to d/c.   Difficult to place patient No       Consultants:  Neurology   Sch Meds:  Scheduled Meds:  aspirin  325 mg Oral QHS   atorvastatin  80 mg Oral Daily   cholecalciferol  5,000 Units Oral Daily   enoxaparin (LOVENOX) injection  40 mg Subcutaneous Q24H   insulin aspart  0-5 Units Subcutaneous QHS   insulin aspart  0-9 Units Subcutaneous TID WC   omega-3 acid ethyl esters  1,000 mg Oral Daily   Continuous Infusions: PRN Meds:.acetaminophen **OR** acetaminophen (TYLENOL) oral liquid 160 mg/5 mL **OR** acetaminophen  Antimicrobials: Anti-infectives (From admission, onward)    None        I have personally reviewed the following labs and images: CBC: Recent Labs  Lab 03/05/21 1800  WBC 8.0  NEUTROABS 5.5  HGB 15.6*  HCT 47.3*  MCV 96.3  PLT 328   BMP &GFR Recent Labs  Lab 03/05/21 1800  NA 138  K 3.8  CL 104  CO2 25  GLUCOSE 147*  BUN 21  CREATININE 1.10*  CALCIUM 9.7   Estimated Creatinine Clearance: 47.4 mL/min (A) (by C-G formula based on SCr of 1.1 mg/dL (H)). Liver & Pancreas: Recent Labs  Lab 03/05/21 1800  AST 23  ALT 13  ALKPHOS 77  BILITOT 1.1  PROT 7.1  ALBUMIN 4.1   No results for input(s): LIPASE, AMYLASE in the last 168 hours. No results for input(s): AMMONIA in the last 168 hours. Diabetic: Recent Labs    03/06/21 0445  HGBA1C 6.4*   Recent Labs  Lab 03/06/21 0447 03/06/21 0824  GLUCAP 138* 136*   Cardiac Enzymes: No results for input(s): CKTOTAL, CKMB, CKMBINDEX, TROPONINI in the last 168 hours. No results for input(s): PROBNP in the last 8760 hours. Coagulation Profile: No results for input(s): INR, PROTIME in the last 168  hours. Thyroid Function Tests: No results for input(s): TSH, T4TOTAL, FREET4, T3FREE, THYROIDAB in the last 72 hours. Lipid Profile: Recent Labs    03/06/21 0445  CHOL 271*  HDL 59  LDLCALC 184*  TRIG 141  CHOLHDL 4.6   Anemia Panel: No results for input(s): VITAMINB12, FOLATE, FERRITIN, TIBC, IRON, RETICCTPCT in the last 72 hours. Urine analysis:    Component Value Date/Time   COLORURINE YELLOW 03/08/2015 0855   APPEARANCEUR CLEAR 03/08/2015 0855   LABSPEC 1.015 03/08/2015 0855   PHURINE 6.0 03/08/2015 0855   GLUCOSEU NEGATIVE 03/08/2015 0855   HGBUR NEGATIVE 03/08/2015 0855   BILIRUBINUR NEGATIVE 03/08/2015 0855   KETONESUR NEGATIVE 03/08/2015 0855   PROTEINUR 30 (A) 06/16/2010 2255   UROBILINOGEN  0.2 03/08/2015 0855   NITRITE NEGATIVE 03/08/2015 0855   LEUKOCYTESUR NEGATIVE 03/08/2015 0855   Sepsis Labs: Invalid input(s): PROCALCITONIN, LACTICIDVEN  Microbiology: Recent Results (from the past 240 hour(s))  Resp Panel by RT-PCR (Flu A&B, Covid) Nasopharyngeal Swab     Status: None   Collection Time: 03/05/21  8:37 PM   Specimen: Nasopharyngeal Swab; Nasopharyngeal(NP) swabs in vial transport medium  Result Value Ref Range Status   SARS Coronavirus 2 by RT PCR NEGATIVE NEGATIVE Final    Comment: (NOTE) SARS-CoV-2 target nucleic acids are NOT DETECTED.  The SARS-CoV-2 RNA is generally detectable in upper respiratory specimens during the acute phase of infection. The lowest concentration of SARS-CoV-2 viral copies this assay can detect is 138 copies/mL. A negative result does not preclude SARS-Cov-2 infection and should not be used as the sole basis for treatment or other patient management decisions. A negative result may occur with  improper specimen collection/handling, submission of specimen other than nasopharyngeal swab, presence of viral mutation(s) within the areas targeted by this assay, and inadequate number of viral copies(<138 copies/mL). A negative  result must be combined with clinical observations, patient history, and epidemiological information. The expected result is Negative.  Fact Sheet for Patients:  BloggerCourse.com  Fact Sheet for Healthcare Providers:  SeriousBroker.it  This test is no t yet approved or cleared by the [place] FDA and  has been authorized for detection and/or diagnosis of SARS-CoV-2 by FDA under an Emergency Use Authorization (EUA). This EUA will remain  in effect (meaning this test can be used) for the duration of the COVID-19 declaration under Section 564(b)(1) of the Act, 21 U.S.C.section 360bbb-3(b)(1), unless the authorization is terminated  or revoked sooner.       Influenza A by PCR NEGATIVE NEGATIVE Final   Influenza B by PCR NEGATIVE NEGATIVE Final    Comment: (NOTE) The Xpert Xpress SARS-CoV-2/FLU/RSV plus assay is intended as an aid in the diagnosis of influenza from Nasopharyngeal swab specimens and should not be used as a sole basis for treatment. Nasal washings and aspirates are unacceptable for Xpert Xpress SARS-CoV-2/FLU/RSV testing.  Fact Sheet for Patients: BloggerCourse.com  Fact Sheet for Healthcare Providers: SeriousBroker.it  This test is not yet approved or cleared by the [place] FDA and has been authorized for detection and/or diagnosis of SARS-CoV-2 by FDA under an Emergency Use Authorization (EUA). This EUA will remain in effect (meaning this test can be used) for the duration of the COVID-19 declaration under Section 564(b)(1) of the Act, 21 U.S.C. section 360bbb-3(b)(1), unless the authorization is terminated or revoked.  Performed at [facility], [address], [place], [place] [address]     Radiology Studies: CT Head Wo Contrast  Result Date: 03/05/2021 CLINICAL DATA:  Revision hypertension EXAM: CT HEAD WITHOUT CONTRAST TECHNIQUE:  Contiguous axial images were obtained from the base of the skull through the vertex without intravenous contrast. COMPARISON:  CT and MRI 08/21/2018 FINDINGS: Brain: Interval increase in the moderate periventricular and subcortical chronic microvascular white matter ischemic disease. Similar advanced for age moderate parenchymal volume loss. No evidence of acute large vascular territory infarction, hemorrhage, hydrocephalus, extra-axial collection or mass lesion/mass effect. Vascular: No hyperdense vessel. Atherosclerotic calcifications of the internal carotid arteries at skull base. Skull: No acute abnormality. Sinuses/Orbits: The visualized portions of the paranasal sinuses and mastoid air cells are predominantly clear. Other: None IMPRESSION: 1. No acute intracranial abnormality. 2. Slight interval increase in the moderate burden of chronic microvascular white matter  ischemic disease. 3. Similar advanced for age moderate parenchymal volume loss. Electronically Signed   By: Maudry Mayhew MD   On: 03/05/2021 19:53   MR ANGIO HEAD WO CONTRAST  Result Date: 03/06/2021 CLINICAL DATA:  Initial evaluation for acute stroke, blurry vision. EXAM: MRI HEAD WITHOUT CONTRAST MRA HEAD WITHOUT CONTRAST MRA NECK WITHOUT AND WITH CONTRAST TECHNIQUE: Multiplanar, multi-echo pulse sequences of the brain and surrounding structures were acquired without intravenous contrast. Angiographic images of the Circle of Willis were acquired using MRA technique without intravenous contrast. Angiographic images of the neck were acquired using MRA technique without and with intravenous contrast. Carotid stenosis measurements (when applicable) are obtained utilizing NASCET criteria, using the distal internal carotid diameter as the denominator. CONTRAST:  None. COMPARISON:  Prior CT from 03/05/2021. FINDINGS: MRI HEAD FINDINGS Brain: Examination moderately degraded by motion artifact. Diffuse prominence of the CSF containing spaces  compatible generalized age-related cerebral atrophy. Patchy and confluent T2/FLAIR hyperintensity within the periventricular and deep white matter both cerebral hemispheres most consistent with chronic small vessel ischemic disease, moderately advanced in nature. Patchy involvement of the pons noted. Multiple scattered remote lacunar infarcts noted about the bilateral basal ganglia and thalami. 6 mm focus of restricted diffusion seen involving the inferior 0 medial left thalamus, consistent with an acute ischemic infarct (series 3, image 23). Few additional punctate acute ischemic infarcts noted slightly more superiorly within the left thalamus (series 3, images 27, 26). Small linear focus of restricted diffusion involving the anterior left frontal lobe measures approximately 8 mm (series 3, image 37), consistent with a small acute ischemic infarct. Additional somewhat vague diffusion abnormality involving the right frontal corona radiata likely reflects changes of subacute small vessel ischemia (series 3, image 33). No associated hemorrhage or mass effect about these infarcts. Gray-white matter differentiation otherwise maintained. No encephalomalacia to suggest chronic cortical infarction elsewhere within the brain. No acute intracranial hemorrhage. Few scattered chronic micro hemorrhages noted about the deep gray nuclei, likely hypertensive in nature. Benign arachnoid cyst noted at the anterior right middle cranial fossa. No other mass lesion, mass effect, or midline shift. Mild ventricular prominence related to global parenchymal volume loss of hydrocephalus. No extra-axial fluid collection. Pituitary gland suprasellar region within normal limits. Midline structures intact. Vascular: Major intracranial vascular flow voids are maintained. Skull and upper cervical spine: Craniocervical junction within normal limits. Bone marrow signal intensity within normal limits. No scalp soft tissue abnormality.  Sinuses/Orbits: Globes and orbital soft tissues within normal limits. Paranasal sinuses are largely clear. No significant mastoid effusion. 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 MRA HEAD FINDINGS Anterior circulation: Visualized distal cervical segments of the internal carotid arteries are patent with antegrade flow. Petrous, cavernous, and supraclinoid segments patent without stenosis or other abnormality. A1 segments widely patent. Normal anterior communicating artery complex. Anterior cerebral arteries patent to their distal aspects without high-grade stenosis. No M1 stenosis or occlusion. Normal MCA bifurcations. Severe distal right M2 stenosis, inferior division (series 352, image 12). Distal MCA branches are otherwise well perfused, although demonstrate probable diffuse small vessel atheromatous irregularity. Posterior circulation: Both V4 segments patent to the vertebrobasilar junction without stenosis. Both PICA origins patent and normal. Basilar patent to its distal aspect without stenosis. Superior cerebral arteries grossly patent at their origins. Both PCAs primarily supplied via the basilar. Severe distal right P2 stenosis (series 356, image 11). PCAs otherwise somewhat irregular but perfused to their distal aspects without high-grade stenosis. No intracranial aneurysm. Anatomic variants: None. MRA NECK  FINDINGS Please note that the MRA portion of this exam was unable to be performed due to patient habitus, inability to fit within the coil. IMPRESSION: MRI HEAD: 1. Patchy small volume acute ischemic nonhemorrhagic left thalamic infarcts as above. 2. Additional 8 mm acute ischemic nonhemorrhagic subcortical infarct involving the anterior left frontal lobe. 3. Small focus of mild diffusion abnormality involving the right frontal corona radiata, likely reflecting evolving subacute small vessel ischemic change. 4. Underlying age-related cerebral atrophy with moderate chronic microvascular  ischemic disease, with multiple remote lacunar infarcts involving the bilateral basal ganglia and thalami. MRA HEAD: 1. Negative intracranial MRA for large vessel occlusion. 2. Severe distal right M2 and right P2 stenoses. 3. Diffuse small vessel atheromatous irregularity throughout the intracranial circulation. MRA NECK: Please note that the MRA portion of this exam was unable to be performed due to patient habitus/inability to fit within the coil. Electronically Signed   By: Rise Mu M.D.   On: 03/06/2021 04:40   MR Angiogram Neck W or Wo Contrast  Result Date: 03/06/2021 CLINICAL DATA:  Initial evaluation for acute stroke, blurry vision. EXAM: MRI HEAD WITHOUT CONTRAST MRA HEAD WITHOUT CONTRAST MRA NECK WITHOUT AND WITH CONTRAST TECHNIQUE: Multiplanar, multi-echo pulse sequences of the brain and surrounding structures were acquired without intravenous contrast. Angiographic images of the Circle of Willis were acquired using MRA technique without intravenous contrast. Angiographic images of the neck were acquired using MRA technique without and with intravenous contrast. Carotid stenosis measurements (when applicable) are obtained utilizing NASCET criteria, using the distal internal carotid diameter as the denominator. CONTRAST:  None. COMPARISON:  Prior CT from 03/05/2021. FINDINGS: MRI HEAD FINDINGS Brain: Examination moderately degraded by motion artifact. Diffuse prominence of the CSF containing spaces compatible generalized age-related cerebral atrophy. Patchy and confluent T2/FLAIR hyperintensity within the periventricular and deep white matter both cerebral hemispheres most consistent with chronic small vessel ischemic disease, moderately advanced in nature. Patchy involvement of the pons noted. Multiple scattered remote lacunar infarcts noted about the bilateral basal ganglia and thalami. 6 mm focus of restricted diffusion seen involving the inferior 0 medial left thalamus, consistent with  an acute ischemic infarct (series 3, image 23). Few additional punctate acute ischemic infarcts noted slightly more superiorly within the left thalamus (series 3, images 27, 26). Small linear focus of restricted diffusion involving the anterior left frontal lobe measures approximately 8 mm (series 3, image 37), consistent with a small acute ischemic infarct. Additional somewhat vague diffusion abnormality involving the right frontal corona radiata likely reflects changes of subacute small vessel ischemia (series 3, image 33). No associated hemorrhage or mass effect about these infarcts. Gray-white matter differentiation otherwise maintained. No encephalomalacia to suggest chronic cortical infarction elsewhere within the brain. No acute intracranial hemorrhage. Few scattered chronic micro hemorrhages noted about the deep gray nuclei, likely hypertensive in nature. Benign arachnoid cyst noted at the anterior right middle cranial fossa. No other mass lesion, mass effect, or midline shift. Mild ventricular prominence related to global parenchymal volume loss of hydrocephalus. No extra-axial fluid collection. Pituitary gland suprasellar region within normal limits. Midline structures intact. Vascular: Major intracranial vascular flow voids are maintained. Skull and upper cervical spine: Craniocervical junction within normal limits. Bone marrow signal intensity within normal limits. No scalp soft tissue abnormality. Sinuses/Orbits: Globes and orbital soft tissues within normal limits. Paranasal sinuses are largely clear. No significant mastoid effusion. 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 MRA HEAD FINDINGS Anterior circulation: Visualized distal cervical segments of the  internal carotid arteries are patent with antegrade flow. Petrous, cavernous, and supraclinoid segments patent without stenosis or other abnormality. A1 segments widely patent. Normal anterior communicating artery complex. Anterior  cerebral arteries patent to their distal aspects without high-grade stenosis. No M1 stenosis or occlusion. Normal MCA bifurcations. Severe distal right M2 stenosis, inferior division (series 352, image 12). Distal MCA branches are otherwise well perfused, although demonstrate probable diffuse small vessel atheromatous irregularity. Posterior circulation: Both V4 segments patent to the vertebrobasilar junction without stenosis. Both PICA origins patent and normal. Basilar patent to its distal aspect without stenosis. Superior cerebral arteries grossly patent at their origins. Both PCAs primarily supplied via the basilar. Severe distal right P2 stenosis (series 356, image 11). PCAs otherwise somewhat irregular but perfused to their distal aspects without high-grade stenosis. No intracranial aneurysm. Anatomic variants: None. MRA NECK FINDINGS Please note that the MRA portion of this exam was unable to be performed due to patient habitus, inability to fit within the coil. IMPRESSION: MRI HEAD: 1. Patchy small volume acute ischemic nonhemorrhagic left thalamic infarcts as above. 2. Additional 8 mm acute ischemic nonhemorrhagic subcortical infarct involving the anterior left frontal lobe. 3. Small focus of mild diffusion abnormality involving the right frontal corona radiata, likely reflecting evolving subacute small vessel ischemic change. 4. Underlying age-related cerebral atrophy with moderate chronic microvascular ischemic disease, with multiple remote lacunar infarcts involving the bilateral basal ganglia and thalami. MRA HEAD: 1. Negative intracranial MRA for large vessel occlusion. 2. Severe distal right M2 and right P2 stenoses. 3. Diffuse small vessel atheromatous irregularity throughout the intracranial circulation. MRA NECK: Please note that the MRA portion of this exam was unable to be performed due to patient habitus/inability to fit within the coil. Electronically Signed   By: Rise Mu M.D.    On: 03/06/2021 04:40   MR BRAIN WO CONTRAST  Result Date: 03/06/2021 CLINICAL DATA:  Initial evaluation for acute stroke, blurry vision. EXAM: MRI HEAD WITHOUT CONTRAST MRA HEAD WITHOUT CONTRAST MRA NECK WITHOUT AND WITH CONTRAST TECHNIQUE: Multiplanar, multi-echo pulse sequences of the brain and surrounding structures were acquired without intravenous contrast. Angiographic images of the Circle of Willis were acquired using MRA technique without intravenous contrast. Angiographic images of the neck were acquired using MRA technique without and with intravenous contrast. Carotid stenosis measurements (when applicable) are obtained utilizing NASCET criteria, using the distal internal carotid diameter as the denominator. CONTRAST:  None. COMPARISON:  Prior CT from 03/05/2021. FINDINGS: MRI HEAD FINDINGS Brain: Examination moderately degraded by motion artifact. Diffuse prominence of the CSF containing spaces compatible generalized age-related cerebral atrophy. Patchy and confluent T2/FLAIR hyperintensity within the periventricular and deep white matter both cerebral hemispheres most consistent with chronic small vessel ischemic disease, moderately advanced in nature. Patchy involvement of the pons noted. Multiple scattered remote lacunar infarcts noted about the bilateral basal ganglia and thalami. 6 mm focus of restricted diffusion seen involving the inferior 0 medial left thalamus, consistent with an acute ischemic infarct (series 3, image 23). Few additional punctate acute ischemic infarcts noted slightly more superiorly within the left thalamus (series 3, images 27, 26). Small linear focus of restricted diffusion involving the anterior left frontal lobe measures approximately 8 mm (series 3, image 37), consistent with a small acute ischemic infarct. Additional somewhat vague diffusion abnormality involving the right frontal corona radiata likely reflects changes of subacute small vessel ischemia (series 3,  image 33). No associated hemorrhage or mass effect about these infarcts. Gray-white matter differentiation otherwise maintained. No  encephalomalacia to suggest chronic cortical infarction elsewhere within the brain. No acute intracranial hemorrhage. Few scattered chronic micro hemorrhages noted about the deep gray nuclei, likely hypertensive in nature. Benign arachnoid cyst noted at the anterior right middle cranial fossa. No other mass lesion, mass effect, or midline shift. Mild ventricular prominence related to global parenchymal volume loss of hydrocephalus. No extra-axial fluid collection. Pituitary gland suprasellar region within normal limits. Midline structures intact. Vascular: Major intracranial vascular flow voids are maintained. Skull and upper cervical spine: Craniocervical junction within normal limits. Bone marrow signal intensity within normal limits. No scalp soft tissue abnormality. Sinuses/Orbits: Globes and orbital soft tissues within normal limits. Paranasal sinuses are largely clear. No significant mastoid effusion. 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 MRA HEAD FINDINGS Anterior circulation: Visualized distal cervical segments of the internal carotid arteries are patent with antegrade flow. Petrous, cavernous, and supraclinoid segments patent without stenosis or other abnormality. A1 segments widely patent. Normal anterior communicating artery complex. Anterior cerebral arteries patent to their distal aspects without high-grade stenosis. No M1 stenosis or occlusion. Normal MCA bifurcations. Severe distal right M2 stenosis, inferior division (series 352, image 12). Distal MCA branches are otherwise well perfused, although demonstrate probable diffuse small vessel atheromatous irregularity. Posterior circulation: Both V4 segments patent to the vertebrobasilar junction without stenosis. Both PICA origins patent and normal. Basilar patent to its distal aspect without stenosis.  Superior cerebral arteries grossly patent at their origins. Both PCAs primarily supplied via the basilar. Severe distal right P2 stenosis (series 356, image 11). PCAs otherwise somewhat irregular but perfused to their distal aspects without high-grade stenosis. No intracranial aneurysm. Anatomic variants: None. MRA NECK FINDINGS Please note that the MRA portion of this exam was unable to be performed due to patient habitus, inability to fit within the coil. IMPRESSION: MRI HEAD: 1. Patchy small volume acute ischemic nonhemorrhagic left thalamic infarcts as above. 2. Additional 8 mm acute ischemic nonhemorrhagic subcortical infarct involving the anterior left frontal lobe. 3. Small focus of mild diffusion abnormality involving the right frontal corona radiata, likely reflecting evolving subacute small vessel ischemic change. 4. Underlying age-related cerebral atrophy with moderate chronic microvascular ischemic disease, with multiple remote lacunar infarcts involving the bilateral basal ganglia and thalami. MRA HEAD: 1. Negative intracranial MRA for large vessel occlusion. 2. Severe distal right M2 and right P2 stenoses. 3. Diffuse small vessel atheromatous irregularity throughout the intracranial circulation. MRA NECK: Please note that the MRA portion of this exam was unable to be performed due to patient habitus/inability to fit within the coil. Electronically Signed   By: Rise Mu M.D.   On: 03/06/2021 04:40   DG Chest Portable 1 View  Result Date: 03/05/2021 CLINICAL DATA:  Hypertension EXAM: PORTABLE CHEST 1 VIEW COMPARISON:  Radiograph 04/05/2015 FINDINGS: Some coarsened reticular changes are noted in the bilateral apices, right greater than left though may be accentuated by patient's exaggerated thoracic kyphotic curvature. Some additional bandlike opacity in the right mid lung and left lung base could reflect atelectasis or scarring. No focal consolidation. No convincing features of edema. No  pneumothorax or layering pleural effusion. Calcified aorta. Cardiomediastinal contours are otherwise unremarkable for positioning and portable technique. Telemetry leads overlie the chest. IMPRESSION: 1. Increasing coarse reticular opacities noted towards the lung apices, possibly accentuated by positioning and technique though can reflect acute or chronic infection or inflammatory change. 2. More bandlike opacities in the mid lungs, likely atelectasis or scarring. 3.  Aortic Atherosclerosis (ICD10-I70.0). Electronically Signed  By: Kreg Shropshire M.D.   On: 03/05/2021 20:29      Charbel Los T. Reisa Coppola [facility]  If 7PM-7AM, please contact night-coverage [email or web address] 03/06/2021, 11:19 AM

## 2021-03-06 NOTE — ED Notes (Signed)
Pt received meal tray from nutrition services

## 2021-03-06 NOTE — Evaluation (Signed)
Physical Therapy Evaluation Patient Details Name: Jade [name] MRN: [id number] DOB: [birth year] Today's Date: 03/06/2021   History of Present Illness  Pt adm 7/11 with binocular blurry vision. MRI with acute lt thalamic , anterior left frontal and possible right frontal and multiple remote lacunar infarcts PMH - DM, CVA, HTN, copd, gout  Clinical Impression  Pt presents to PT with decr mobility due to weakness, decr vision, and decr balance. Needs skilled PT to maximize independence so pt can eventually return home. Feel pt could benefit from CIR for more rehab prior to return home.     Follow Up Recommendations CIR    Equipment Recommendations  None recommended by PT    Recommendations for Other Services Rehab consult     Precautions / Restrictions Precautions Precautions: Fall      Mobility  Bed Mobility Overal bed mobility: Needs Assistance Bed Mobility: Supine to Sit;Sit to Supine     Supine to sit: Min assist Sit to supine: Min assist   General bed mobility comments: Assist to elevate trunk into sitting and bring hips to EOB. Assist to lower trunk and bring legs back into bed    Transfers Overall transfer level: Needs assistance Equipment used: Rolling walker (2 wheeled) Transfers: Sit to/from Stand Sit to Stand: Min assist;From elevated surface         General transfer comment: Assist to bring hips up and for balance  Ambulation/Gait Ambulation/Gait assistance: Min assist Gait Distance (Feet): 2 Feet Assistive device: Rolling walker (2 wheeled) Gait Pattern/deviations: Step-to pattern;Decreased step length - right;Decreased step length - left Gait velocity: decr Gait velocity interpretation: <1.31 ft/sec, indicative of household ambulator General Gait Details: Pt side stepped up side of bed. Assist for balance and support. Pt with incr time and effort to move RLE.  Stairs            Wheelchair Mobility    Modified Rankin (Stroke Patients  Only) Modified Rankin (Stroke Patients Only) Pre-Morbid Rankin Score: Moderate disability Modified Rankin: Moderately severe disability     Balance Overall balance assessment: Needs assistance;History of Falls Sitting-balance support: Bilateral upper extremity supported;Feet unsupported Sitting balance-Leahy Scale: Poor Sitting balance - Comments: UE support and intermittent min assisf Postural control: Left lateral lean Standing balance support: Bilateral upper extremity supported Standing balance-Leahy Scale: Poor Standing balance comment: walker and min assist for static standing                             Pertinent Vitals/Pain Pain Assessment: No/denies pain    Home Living Family/patient expects to be discharged to:: Private residence Living Arrangements: Spouse/significant other Available Help at Discharge: Family;Available PRN/intermittently (husband works) Type of Home: House Home Access: Stairs to enter Entrance Stairs-Rails: None Secretary/administrator of Steps: 1 Home Layout: One level Home Equipment: Cane - single point;Grab bars - toilet;Grab bars - tub/shower;Walker - 4 wheels      Prior Function Level of Independence: Needs assistance   Gait / Transfers Assistance Needed: modified independent with cane or rollator. Pt with frequent falls.     Comments: doesn't drive     Hand Dominance   Dominant Hand: Right    Extremity/Trunk Assessment   Upper Extremity Assessment Upper Extremity Assessment: Defer to OT evaluation    Lower Extremity Assessment Lower Extremity Assessment: RLE deficits/detail;LLE deficits/detail RLE Deficits / Details: Hip 3/5, knee 3+/5, ankle 2+/5 LLE Deficits / Details: Hip 3/5, knee 4/5, ankle 3/5  Communication   Communication: No difficulties  Cognition Arousal/Alertness: Awake/alert Behavior During Therapy: WFL for tasks assessed/performed Overall Cognitive Status: Within Functional Limits for tasks  assessed                                        General Comments      Exercises     Assessment/Plan    PT Assessment Patient needs continued PT services  PT Problem List Decreased strength;Decreased activity tolerance;Decreased balance;Decreased mobility       PT Treatment Interventions DME instruction;Gait training;Functional mobility training;Stair training;Therapeutic activities;Therapeutic exercise;Balance training;Patient/family education    PT Goals (Current goals can be found in the Care Plan section)  Acute Rehab PT Goals Patient Stated Goal: return home PT Goal Formulation: With patient Time For Goal Achievement: 03/20/21 Potential to Achieve Goals: Good    Frequency Min 4X/week   Barriers to discharge Decreased caregiver support husband works    Co-evaluation               AM-PAC PT "6 Clicks" Mobility  Outcome Measure Help needed turning from your back to your side while in a flat bed without using bedrails?: A Little Help needed moving from lying on your back to sitting on the side of a flat bed without using bedrails?: A Little Help needed moving to and from a bed to a chair (including a wheelchair)?: A Little Help needed standing up from a chair using your arms (e.g., wheelchair or bedside chair)?: A Little Help needed to walk in hospital room?: A Little Help needed climbing 3-5 steps with a railing? : Total 6 Click Score: 16    End of Session Equipment Utilized During Treatment: Gait belt Activity Tolerance: Patient tolerated treatment well Patient left: in bed;with call bell/phone within reach Nurse Communication: Mobility status PT Visit Diagnosis: Other abnormalities of gait and mobility (R26.89);Hemiplegia and hemiparesis Hemiplegia - Right/Left: Right Hemiplegia - dominant/non-dominant: Dominant Hemiplegia - caused by: Cerebral infarction    Time: 6734-1937 PT Time Calculation (min) (ACUTE ONLY): 25 min   Charges:    PT Evaluation $PT Eval Moderate Complexity: 1 Mod PT Treatments $Therapeutic Activity: 8-22 mins        [facility] PT Acute Rehabilitation Services Pager [phone] Office [phone]   Angelina Ok [facility] 03/06/2021, 3:42 PM

## 2021-03-06 NOTE — ED Notes (Signed)
To mri 

## 2021-03-06 NOTE — ED Notes (Signed)
Notified Gonfa MD about pt BP being 194/96. No prn orders noted in chart. Awaiting response of provider.

## 2021-03-06 NOTE — ED Notes (Signed)
Pt vomited in mri we washed the pt changed her bed and gown clean bllankets pure wick placed

## 2021-03-06 NOTE — ED Notes (Signed)
The pt appears to be more alert now than two hours ago

## 2021-03-07 DIAGNOSIS — I639 Cerebral infarction, unspecified: Secondary | ICD-10-CM

## 2021-03-07 LAB — GLUCOSE, CAPILLARY
Glucose-Capillary: 121 mg/dL — ABNORMAL HIGH (ref 70–99)
Glucose-Capillary: 165 mg/dL — ABNORMAL HIGH (ref 70–99)
Glucose-Capillary: 114 mg/dL — ABNORMAL HIGH (ref 70–99)
Glucose-Capillary: 248 mg/dL — ABNORMAL HIGH (ref 70–99)

## 2021-03-07 MED ORDER — ASPIRIN EC 81 MG PO TBEC
81.0000 mg | DELAYED_RELEASE_TABLET | Freq: Every day | ORAL | Status: DC
  Administered 2021-03-07 – 2021-03-14 (×8): 81 mg via ORAL
  Filled 2021-03-07 (×8): qty 1

## 2021-03-07 MED ORDER — LABETALOL HCL 5 MG/ML IV SOLN
10.0000 mg | INTRAVENOUS | Status: DC | PRN
  Administered 2021-03-07 – 2021-03-09 (×5): 10 mg via INTRAVENOUS
  Filled 2021-03-07 (×6): qty 4

## 2021-03-07 MED ORDER — CLOPIDOGREL BISULFATE 75 MG PO TABS
75.0000 mg | ORAL_TABLET | Freq: Every day | ORAL | Status: DC
  Administered 2021-03-07 – 2021-03-14 (×8): 75 mg via ORAL
  Filled 2021-03-07 (×8): qty 1

## 2021-03-07 NOTE — Consult Note (Addendum)
Stroke consult notes  ATTENDING NOTE: I reviewed above note and agree with the assessment and plan. Pt was seen and examined.   65 year old female with history of hypertension, diabetes, hyperlipidemia, OSA, obesity, CKD presented to ER for blurry vision and hypertension.  Per patient, she had vision difficulty for some time, diagnosed with diabetic retinopathy, reported to go through several injections.  Yesterday she had acute worsening of blurry vision in both eyes, close either eye did not help.  She came to ER for evaluation.  CT no acute abnormality.  CTA head and neck left ICA 75% stenosis.  MRI showed left inferior thalamus small infarct, punctate left frontal infarcts.  MRA head showed severe distal right M2 and right P2 stenosis.  A1c 6.4, LDL 184.  EF 55 to 60%, creatinine 1.10.  She had a stroke in 02/2018 with left thalamic/internal capsule infarct.  EF 60 to 65%, LDL 130, A1c 5.8.  CT head and neck at that time left ICA 80% stenosis.  Referred her to vascular surgery and was planning to do left CEA, however patient canceled the procedure herself.  She then followed with her PCP and referred to see a neurologist Dr. Charyl Dancer in [place], had carotid Doppler on 02/20/2021 which reported as unremarkable.  Neuro - awake, alert, eyes open, orientated to age, place, time and people. No aphasia, fluent language, following all simple commands. Able to name and repeat and read. No gaze palsy, tracking bilaterally, bilateral end-gaze nystagmus, visual field full, PERRL. No facial droop. Tongue midline. Bilateral UEs 5/5, no drift. Bilaterally LEs 5/5, no drift. Sensation symmetrical bilaterally except decreased right LE light touch sensation, b/l FTN intact, gait not tested.   Etiology for patient stroke still more likely small vessel disease, however left high-grade ICA stenosis need to be monitored and followed up closely.  She preferred to follow-up with neurology in town, I will set up her to see Dr.  Pearlean Brownie as he read the patient carotid Doppler recently.  Continue aspirin 81 and Plavix 75 for 3 weeks and then Plavix alone.  Continue Lipitor 80.  PT/OT recommend CIR.  Neurology will sign off. Please call with questions. Pt will follow up with stroke clinic Dr. Pearlean Brownie at [facility] in about 4 weeks. Thanks for the consult.   Marvel Plan, MD PhD Stroke Neurology 03/07/2021 5:12 PM    Referring Physician: Dr Alanda Slim    Reason for Consult: stroke  HPI: Jade [name] is an 65 y.o. female with medical history significant of HTN, prior stroke, diet controlled DM2, HLD and known carotid disease- she refuses statins and has refused CEA. The pt tells me she presented to the ER on 7/10 after awaking with blurred vision. No clear VF deficits. Denies double vision or dizziness, h/a or pain. She presents with hypertensive emergency (EMS reported as high as 260 SBP). MRI showed a new stroke in left thalamus, multiple older subcortical infarcts in left frontal noted. Additionally, she tells me she has recently been seen by a retinal specialist for vision issues and plans for "injections" was scheduled tomorrow.   Date last known well: Date: 03/04/2021 Time last known well: Unable to determine  tPA Given: no  Past Medical History Past Medical History:  Diagnosis Date   Arthritis    "back, knees, some in my left hip" (03/21/2018)   Chronic kidney disease    "was told I had 25% kidney function in early 2012" (03/21/2018)   COPD (chronic obstructive pulmonary disease) (HCC)  CVA (cerebral vascular accident) ([place]) 03/21/2018   "numb all over; head to toe" (03/21/2018)   Fibromyalgia    History of gout    Hyperlipidemia    Hypertension    Migraine    "used to get them monthly during menopause" (03/21/2018)   Neuromuscular disorder ([place])    OSA (obstructive sleep apnea)    "don't tolerate the machine" (03/21/2018)   Pneumonia ~ 2007   Type 2 diabetes, diet controlled ([place])     Surgical History Past  Surgical History:  Procedure Laterality Date   HERNIA REPAIR     LAPAROSCOPIC CHOLECYSTECTOMY  06/2007   "no UHR w/this" (03/21/2018)   TONSILLECTOMY     UMBILICAL HERNIA REPAIR  2009    Family History  Family History  Problem Relation Age of Onset   Heart disease Father    Leukemia Father    Alcohol abuse Son    Other Mother    Cancer Neg Hx    Diabetes Neg Hx    Hearing loss Neg Hx    Hyperlipidemia Neg Hx    Hypertension Neg Hx    Kidney disease Neg Hx    Stroke Neg Hx     Social History:   reports that she quit smoking about 33 years ago. Her smoking use included cigarettes. She has a 20.00 pack-year smoking history. She has never used smokeless tobacco. She reports previous alcohol use. She reports previous drug use. Drug: Marijuana.  Allergies:  Allergies  Allergen Reactions   Nickel Dermatitis and Rash    Home Medications:  Medications Prior to Admission  Medication Sig Dispense Refill   Aspirin 325 MG CAPS Take 325 mg by mouth daily.     b complex vitamins capsule Take 1 capsule by mouth daily.     Cholecalciferol (VITAMIN D-3) 5000 UNITS TABS Take 5,000 Units by mouth daily.      Coenzyme Q10 (COQ-10) 100 MG CAPS Take 100 mg by mouth daily.     hydrochlorothiazide (MICROZIDE) 12.5 MG capsule Take 1 capsule (12.5 mg total) by mouth daily. 30 capsule 0   magnesium gluconate (MAGONATE) 500 MG tablet Take 500 mg by mouth daily.     Menaquinone-7 (VITAMIN K2 PO) Take 1 tablet by mouth daily.     Omega-3 Fatty Acids (FISH OIL) 600 MG CAPS Take 600 mg by mouth 3 (three) times daily.      PRESCRIPTION MEDICATION Place 1 Dose into the left eye every 30 (thirty) days. Patient not sure about name of prescription - for the fluid filled bubble on the retina of left eye     Blood Glucose Calibration (ACCU-CHEK SMARTVIEW CONTROL) LIQD Test up to TID dx:E11.40 3 each 3   Blood Glucose Monitoring Suppl (ACCU-CHEK NANO SMARTVIEW) W/DEVICE KIT Test up to TID dx:E11.40 1 kit 3    glucose blood (ACCU-CHEK SMARTVIEW) test strip Test up to TID dx:E11.40 300 each 3   Lancets Misc. (ACCU-CHEK FASTCLIX LANCET) KIT Test up to TID dx:E11.40 1 kit 2    Hospital Medications  aspirin  325 mg Oral QHS   atorvastatin  80 mg Oral Daily   cholecalciferol  5,000 Units Oral Daily   enoxaparin (LOVENOX) injection  40 mg Subcutaneous Q24H   insulin aspart  0-5 Units Subcutaneous QHS   insulin aspart  0-9 Units Subcutaneous TID WC   omega-3 acid ethyl esters  1,000 mg Oral Daily    ROS:  History obtained from the patient   General ROS: negative for -  chills, fatigue, fever, night sweats, weight gain or weight loss Psychological ROS: negative for - behavioral disorder, hallucinations, memory difficulties, mood swings or suicidal ideation Ophthalmic ROS: negative for - double vision, eye pain or loss of vision. Positive for blurry vision ENT ROS: negative for - epistaxis, nasal discharge, oral lesions, sore throat, tinnitus or vertigo Allergy and Immunology ROS: negative for - hives or itchy/watery eyes Hematological and Lymphatic ROS: negative for - bleeding problems, bruising or swollen lymph nodes Endocrine ROS: negative for - galactorrhea, hair pattern changes, polydipsia/polyuria or temperature intolerance Respiratory ROS: negative for - cough, hemoptysis, shortness of breath or wheezing Cardiovascular ROS: negative for - chest pain, dyspnea on exertion, edema or irregular heartbeat Gastrointestinal ROS: negative for - abdominal pain, diarrhea, hematemesis, nausea/vomiting or stool incontinence Genito-Urinary ROS: negative for - dysuria, hematuria, incontinence or urinary frequency/urgency Musculoskeletal ROS: negative for - joint swelling or muscular weakness Neurological ROS: as noted in HPI Dermatological ROS: negative for rash and skin lesion changes   Physical Examination:  Vitals:   03/06/21 1800 03/06/21 1830 03/06/21 2009 03/07/21 0454  BP: (!) 197/88 (!) 183/102  (!) 194/87 (!) 175/95  Pulse: 65 67 63 69  Resp: $Remo'15 15 19 18  'PKjRx$ Temp:   98 F (36.7 C) 98.6 F (37 C)  TempSrc:   Oral Oral  SpO2: 94% 93% 95% 98%  Weight:      Height:        General: Appears well-developed elderly. Psych: Affect appropriate to situation Eyes: No scleral injection, nystagmus and upward gaze palsy.  HENT: poor dentition, mucus membranes moist, pink Head: Normocephalic. sparse hair with alopecia and scalp sores noted w/scabs Cardiovascular: Normal rate and regular rhythm.  Respiratory: Effort normal and breath sounds normal to anterior ascultation GI: Soft.  No distension. There is no tenderness.  Skin: WDI    Neurological Examination Mental Status: Alert, oriented to basic questions, but has poor insight/judgement to situation.  Speech slow, but fluent without evidence of aphasia. Able to follow 3 step commands without difficulty. Cranial Nerves: PERRL, Vfs are grossly normal, but on EOM, she has an upward palsy. Gaze is slightly dysconjugate with left eye more outward appearing, but can accommodate. She has nystagmus in all directions. Motor: Right : Upper extremity   5/5    Left:     Upper extremity   5/5  Lower extremity   5/5     Lower extremity   5/5 Tone and bulk:normal tone throughout; some generalized weakness noted.  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  Deep Tendon Reflexes: 1+ bilat LE's, wnl for UEs Plantars: Right: downgoing   Left: downgoing Cerebellar: normal finger-to-nose, unable to complete heel-to-shin test d/t pain she said Gait: did not test NIHSS: 1 for vision  LABORATORY STUDIES:  Basic Metabolic Panel: Recent Labs  Lab 03/05/21 1800  NA 138  K 3.8  CL 104  CO2 25  GLUCOSE 147*  BUN 21  CREATININE 1.10*  CALCIUM 9.7    Liver Function Tests: Recent Labs  Lab 03/05/21 1800  AST 23  ALT 13  ALKPHOS 77  BILITOT 1.1  PROT 7.1   ALBUMIN 4.1   No results for input(s): LIPASE, AMYLASE in the last 168 hours. No results for input(s): AMMONIA in the last 168 hours.  CBC: Recent Labs  Lab 03/05/21 1800  WBC 8.0  NEUTROABS 5.5  HGB 15.6*  HCT 47.3*  MCV 96.3  PLT  328    Cardiac Enzymes: No results for input(s): CKTOTAL, CKMB, CKMBINDEX, TROPONINI in the last 168 hours.  BNP: Invalid input(s): POCBNP  CBG: Recent Labs  Lab 03/06/21 1202 03/06/21 1718 03/06/21 2014 03/07/21 0835 03/07/21 1233  GLUCAP 214* 124* 112* 121* 165*    Microbiology:   Coagulation Studies: No results for input(s): LABPROT, INR in the last 72 hours.  Urinalysis: No results for input(s): COLORURINE, LABSPEC, PHURINE, GLUCOSEU, HGBUR, BILIRUBINUR, KETONESUR, PROTEINUR, UROBILINOGEN, NITRITE, LEUKOCYTESUR in the last 168 hours.  Invalid input(s): APPERANCEUR  Lipid Panel:     Component Value Date/Time   CHOL 271 (H) 03/06/2021 0445   TRIG 141 03/06/2021 0445   HDL 59 03/06/2021 0445   CHOLHDL 4.6 03/06/2021 0445   VLDL 28 03/06/2021 0445   LDLCALC 184 (H) 03/06/2021 0445    HgbA1C:  Lab Results  Component Value Date   HGBA1C 6.4 (H) 03/06/2021    Urine Drug Screen:      Component Value Date/Time   LABOPIA NONE DETECTED 03/24/2018 1623   COCAINSCRNUR NONE DETECTED 03/24/2018 1623   LABBENZ NONE DETECTED 03/24/2018 1623   AMPHETMU NONE DETECTED 03/24/2018 1623   THCU NONE DETECTED 03/24/2018 1623   LABBARB NONE DETECTED 03/24/2018 1623     Alcohol Level:  No results for input(s): ETH in the last 168 hours.   IMAGING: CT ANGIO HEAD NECK W WO CM  Result Date: 03/06/2021 CLINICAL DATA:  Hypertension.  Blurred vision.  Question stroke. EXAM: CT ANGIOGRAPHY HEAD AND NECK TECHNIQUE: Multidetector CT imaging of the head and neck was performed using the standard protocol during bolus administration of intravenous contrast. Multiplanar CT image reconstructions and MIPs were obtained to evaluate the vascular  anatomy. Carotid stenosis measurements (when applicable) are obtained utilizing NASCET criteria, using the distal internal carotid diameter as the denominator. CONTRAST:  67mL OMNIPAQUE IOHEXOL 350 MG/ML SOLN COMPARISON:  MRI earlier same day showing left thalamic infarction in small infarctions in the left cerebral hemisphere. FINDINGS: CT HEAD FINDINGS Brain: Generalized atrophy. No focal abnormality seen affecting the brainstem or cerebellum by CT. 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 There are extensive chronic small-vessel ischemic changes throughout the hemispheric white matter. Old small vessel infarction left thalamus. No acute finding by CT. No hemorrhage, hydrocephalus or subdural collection. Vascular: There is atherosclerotic calcification of the major vessels at the base of the brain. Skull: Negative Sinuses: Clear Orbits: Normal Review of the MIP images confirms the above findings CTA NECK FINDINGS Aortic arch: Aortic atherosclerotic calcification. Branching pattern is normal without flow limiting origin stenosis. Right carotid system: Common carotid artery is widely patent to the bifurcation. There is heavily calcified plaque at the carotid bifurcation and ICA bulb. Minimal diameter is 4 mm. Compared to a more distal cervical ICA diameter of 4 mm, there is no stenosis. Left carotid system: Common carotid artery shows scattered plaque but is widely patent to the bifurcation. There is calcified plaque at the carotid bifurcation and ICA bulb. Minimal diameter of the proximal ICA is 1 mm. Compared to a more distal cervical ICA diameter of 4 mm, this indicates a 75% stenosis. Vertebral arteries: Both vertebral arteries show calcified plaque at the origin but without stenosis. Beyond that, both vertebral arteries are patent through the cervical region to the foramen magnum. Skeleton: Exaggerated cervical lordosis. Degenerative spondylosis at C3-4  with fusion. Other neck: No soft tissue mass or lymphadenopathy. Upper chest: Lung apices are clear.  Review of the MIP images confirms the above findings CTA HEAD FINDINGS Anterior circulation: Both internal carotid arteries are patent through the skull base and siphon regions. No siphon stenosis. The anterior and middle cerebral vessels are patent without large or medium vessel occlusion. More distal branch vessels show atherosclerotic narrowing and irregularity. No correctable proximal stenosis. No aneurysm or vascular malformation. Posterior circulation: Both vertebral arteries are patent through the foramen magnum to the basilar. No basilar stenosis. Posterior circulation branch vessels are patent. More distal PCA branches show atherosclerotic irregularity, most severe in the P1 P2 junction regions on both sides. Venous sinuses: Patent and normal. Anatomic variants: None significant. Review of the MIP images confirms the above findings IMPRESSION: No large vessel occlusion. Aortic atherosclerosis. Atherosclerotic disease at both carotid bifurcations. No measurable stenosis on the right. 75% stenosis of the proximal ICA on the left. Intracranial vessels show distal vessel atherosclerotic narrowing and irregularity. No correctable proximal stenosis. Electronically Signed   By: Nelson Chimes M.D.   On: 03/06/2021 18:02   CT Head Wo Contrast  Result Date: 03/05/2021 CLINICAL DATA:  Revision hypertension EXAM: CT HEAD WITHOUT CONTRAST TECHNIQUE: Contiguous axial images were obtained from the base of the skull through the vertex without intravenous contrast. COMPARISON:  CT and MRI 08/21/2018 FINDINGS: Brain: Interval increase in the moderate periventricular and subcortical chronic microvascular white matter ischemic disease. Similar advanced for age moderate parenchymal volume loss. No evidence of acute large vascular territory infarction, hemorrhage, hydrocephalus, extra-axial collection or mass lesion/mass  effect. Vascular: No hyperdense vessel. Atherosclerotic calcifications of the internal carotid arteries at skull base. Skull: No acute abnormality. Sinuses/Orbits: The visualized portions of the paranasal sinuses and mastoid air cells are predominantly clear. Other: None IMPRESSION: 1. No acute intracranial abnormality. 2. Slight interval increase in the moderate burden of chronic microvascular white matter ischemic disease. 3. Similar advanced for age moderate parenchymal volume loss. Electronically Signed   By: Dahlia Bailiff MD   On: 03/05/2021 19:53   MR ANGIO HEAD WO CONTRAST  Result Date: 03/06/2021 CLINICAL DATA:  Initial evaluation for acute stroke, blurry vision. EXAM: MRI HEAD WITHOUT CONTRAST MRA HEAD WITHOUT CONTRAST MRA NECK WITHOUT AND WITH CONTRAST TECHNIQUE: Multiplanar, multi-echo pulse sequences of the brain and surrounding structures were acquired without intravenous contrast. Angiographic images of the Circle of Willis were acquired using MRA technique without intravenous contrast. Angiographic images of the neck were acquired using MRA technique without and with intravenous contrast. Carotid stenosis measurements (when applicable) are obtained utilizing NASCET criteria, using the distal internal carotid diameter as the denominator. CONTRAST:  None. COMPARISON:  Prior CT from 03/05/2021. FINDINGS: MRI HEAD FINDINGS Brain: Examination moderately degraded by motion artifact. Diffuse prominence of the CSF containing spaces compatible generalized age-related cerebral atrophy. Patchy and confluent T2/FLAIR hyperintensity within the periventricular and deep white matter both cerebral hemispheres most consistent with chronic small vessel ischemic disease, moderately advanced in nature. Patchy involvement of the pons noted. Multiple scattered remote lacunar infarcts noted about the bilateral basal ganglia and thalami. 6 mm focus of restricted diffusion seen involving the inferior 0 medial left  thalamus, consistent with an acute ischemic infarct (series 3, image 23). Few additional punctate acute ischemic infarcts noted slightly more superiorly within the left thalamus (series 3, images 27, 26). Small linear focus of restricted diffusion involving the anterior left frontal lobe measures approximately 8 mm (series 3, image 37), consistent with a small acute ischemic infarct. Additional somewhat vague diffusion abnormality involving the right frontal corona  radiata likely reflects changes of subacute small vessel ischemia (series 3, image 33). No associated hemorrhage or mass effect about these infarcts. Gray-white matter differentiation otherwise maintained. No encephalomalacia to suggest chronic cortical infarction elsewhere within the brain. No acute intracranial hemorrhage. Few scattered chronic micro hemorrhages noted about the deep gray nuclei, likely hypertensive in nature. Benign arachnoid cyst noted at the anterior right middle cranial fossa. No other mass lesion, mass effect, or midline shift. Mild ventricular prominence related to global parenchymal volume loss of hydrocephalus. No extra-axial fluid collection. Pituitary gland suprasellar region within normal limits. Midline structures intact. Vascular: Major intracranial vascular flow voids are maintained. Skull and upper cervical spine: Craniocervical junction within normal limits. Bone marrow signal intensity within normal limits. No scalp soft tissue abnormality. Sinuses/Orbits: Globes and orbital soft tissues within normal limits. Paranasal sinuses are largely clear. No significant mastoid effusion. 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 MRA HEAD FINDINGS Anterior circulation: Visualized distal cervical segments of the internal carotid arteries are patent with antegrade flow. Petrous, cavernous, and supraclinoid segments patent without stenosis or other abnormality. A1 segments widely patent. Normal anterior communicating  artery complex. Anterior cerebral arteries patent to their distal aspects without high-grade stenosis. No M1 stenosis or occlusion. Normal MCA bifurcations. Severe distal right M2 stenosis, inferior division (series 352, image 12). Distal MCA branches are otherwise well perfused, although demonstrate probable diffuse small vessel atheromatous irregularity. Posterior circulation: Both V4 segments patent to the vertebrobasilar junction without stenosis. Both PICA origins patent and normal. Basilar patent to its distal aspect without stenosis. Superior cerebral arteries grossly patent at their origins. Both PCAs primarily supplied via the basilar. Severe distal right P2 stenosis (series 356, image 11). PCAs otherwise somewhat irregular but perfused to their distal aspects without high-grade stenosis. No intracranial aneurysm. Anatomic variants: None. MRA NECK FINDINGS Please note that the MRA portion of this exam was unable to be performed due to patient habitus, inability to fit within the coil. IMPRESSION: MRI HEAD: 1. Patchy small volume acute ischemic nonhemorrhagic left thalamic infarcts as above. 2. Additional 8 mm acute ischemic nonhemorrhagic subcortical infarct involving the anterior left frontal lobe. 3. Small focus of mild diffusion abnormality involving the right frontal corona radiata, likely reflecting evolving subacute small vessel ischemic change. 4. Underlying age-related cerebral atrophy with moderate chronic microvascular ischemic disease, with multiple remote lacunar infarcts involving the bilateral basal ganglia and thalami. MRA HEAD: 1. Negative intracranial MRA for large vessel occlusion. 2. Severe distal right M2 and right P2 stenoses. 3. Diffuse small vessel atheromatous irregularity throughout the intracranial circulation. MRA NECK: Please note that the MRA portion of this exam was unable to be performed due to patient habitus/inability to fit within the coil. Electronically Signed   By:  Jeannine Boga M.D.   On: 03/06/2021 04:40   MR Angiogram Neck W or Wo Contrast  Result Date: 03/06/2021 CLINICAL DATA:  Initial evaluation for acute stroke, blurry vision. EXAM: MRI HEAD WITHOUT CONTRAST MRA HEAD WITHOUT CONTRAST MRA NECK WITHOUT AND WITH CONTRAST TECHNIQUE: Multiplanar, multi-echo pulse sequences of the brain and surrounding structures were acquired without intravenous contrast. Angiographic images of the Circle of Willis were acquired using MRA technique without intravenous contrast. Angiographic images of the neck were acquired using MRA technique without and with intravenous contrast. Carotid stenosis measurements (when applicable) are obtained utilizing NASCET criteria, using the distal internal carotid diameter as the denominator. CONTRAST:  None. COMPARISON:  Prior CT from 03/05/2021. FINDINGS: MRI HEAD FINDINGS Brain:  Examination moderately degraded by motion artifact. Diffuse prominence of the CSF containing spaces compatible generalized age-related cerebral atrophy. Patchy and confluent T2/FLAIR hyperintensity within the periventricular and deep white matter both cerebral hemispheres most consistent with chronic small vessel ischemic disease, moderately advanced in nature. Patchy involvement of the pons noted. Multiple scattered remote lacunar infarcts noted about the bilateral basal ganglia and thalami. 6 mm focus of restricted diffusion seen involving the inferior 0 medial left thalamus, consistent with an acute ischemic infarct (series 3, image 23). Few additional punctate acute ischemic infarcts noted slightly more superiorly within the left thalamus (series 3, images 27, 26). Small linear focus of restricted diffusion involving the anterior left frontal lobe measures approximately 8 mm (series 3, image 37), consistent with a small acute ischemic infarct. Additional somewhat vague diffusion abnormality involving the right frontal corona radiata likely reflects changes of  subacute small vessel ischemia (series 3, image 33). No associated hemorrhage or mass effect about these infarcts. Gray-white matter differentiation otherwise maintained. No encephalomalacia to suggest chronic cortical infarction elsewhere within the brain. No acute intracranial hemorrhage. Few scattered chronic micro hemorrhages noted about the deep gray nuclei, likely hypertensive in nature. Benign arachnoid cyst noted at the anterior right middle cranial fossa. No other mass lesion, mass effect, or midline shift. Mild ventricular prominence related to global parenchymal volume loss of hydrocephalus. No extra-axial fluid collection. Pituitary gland suprasellar region within normal limits. Midline structures intact. Vascular: Major intracranial vascular flow voids are maintained. Skull and upper cervical spine: Craniocervical junction within normal limits. Bone marrow signal intensity within normal limits. No scalp soft tissue abnormality. Sinuses/Orbits: Globes and orbital soft tissues within normal limits. Paranasal sinuses are largely clear. No significant mastoid effusion. 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 MRA HEAD FINDINGS Anterior circulation: Visualized distal cervical segments of the internal carotid arteries are patent with antegrade flow. Petrous, cavernous, and supraclinoid segments patent without stenosis or other abnormality. A1 segments widely patent. Normal anterior communicating artery complex. Anterior cerebral arteries patent to their distal aspects without high-grade stenosis. No M1 stenosis or occlusion. Normal MCA bifurcations. Severe distal right M2 stenosis, inferior division (series 352, image 12). Distal MCA branches are otherwise well perfused, although demonstrate probable diffuse small vessel atheromatous irregularity. Posterior circulation: Both V4 segments patent to the vertebrobasilar junction without stenosis. Both PICA origins patent and normal. Basilar  patent to its distal aspect without stenosis. Superior cerebral arteries grossly patent at their origins. Both PCAs primarily supplied via the basilar. Severe distal right P2 stenosis (series 356, image 11). PCAs otherwise somewhat irregular but perfused to their distal aspects without high-grade stenosis. No intracranial aneurysm. Anatomic variants: None. MRA NECK FINDINGS Please note that the MRA portion of this exam was unable to be performed due to patient habitus, inability to fit within the coil. IMPRESSION: MRI HEAD: 1. Patchy small volume acute ischemic nonhemorrhagic left thalamic infarcts as above. 2. Additional 8 mm acute ischemic nonhemorrhagic subcortical infarct involving the anterior left frontal lobe. 3. Small focus of mild diffusion abnormality involving the right frontal corona radiata, likely reflecting evolving subacute small vessel ischemic change. 4. Underlying age-related cerebral atrophy with moderate chronic microvascular ischemic disease, with multiple remote lacunar infarcts involving the bilateral basal ganglia and thalami. MRA HEAD: 1. Negative intracranial MRA for large vessel occlusion. 2. Severe distal right M2 and right P2 stenoses. 3. Diffuse small vessel atheromatous irregularity throughout the intracranial circulation. MRA NECK: Please note that the MRA portion of this exam was  unable to be performed due to patient habitus/inability to fit within the coil. Electronically Signed   By: Jeannine Boga M.D.   On: 03/06/2021 04:40   MR BRAIN WO CONTRAST  Result Date: 03/06/2021 CLINICAL DATA:  Initial evaluation for acute stroke, blurry vision. EXAM: MRI HEAD WITHOUT CONTRAST MRA HEAD WITHOUT CONTRAST MRA NECK WITHOUT AND WITH CONTRAST TECHNIQUE: Multiplanar, multi-echo pulse sequences of the brain and surrounding structures were acquired without intravenous contrast. Angiographic images of the Circle of Willis were acquired using MRA technique without intravenous contrast.  Angiographic images of the neck were acquired using MRA technique without and with intravenous contrast. Carotid stenosis measurements (when applicable) are obtained utilizing NASCET criteria, using the distal internal carotid diameter as the denominator. CONTRAST:  None. COMPARISON:  Prior CT from 03/05/2021. FINDINGS: MRI HEAD FINDINGS Brain: Examination moderately degraded by motion artifact. Diffuse prominence of the CSF containing spaces compatible generalized age-related cerebral atrophy. Patchy and confluent T2/FLAIR hyperintensity within the periventricular and deep white matter both cerebral hemispheres most consistent with chronic small vessel ischemic disease, moderately advanced in nature. Patchy involvement of the pons noted. Multiple scattered remote lacunar infarcts noted about the bilateral basal ganglia and thalami. 6 mm focus of restricted diffusion seen involving the inferior 0 medial left thalamus, consistent with an acute ischemic infarct (series 3, image 23). Few additional punctate acute ischemic infarcts noted slightly more superiorly within the left thalamus (series 3, images 27, 26). Small linear focus of restricted diffusion involving the anterior left frontal lobe measures approximately 8 mm (series 3, image 37), consistent with a small acute ischemic infarct. Additional somewhat vague diffusion abnormality involving the right frontal corona radiata likely reflects changes of subacute small vessel ischemia (series 3, image 33). No associated hemorrhage or mass effect about these infarcts. Gray-white matter differentiation otherwise maintained. No encephalomalacia to suggest chronic cortical infarction elsewhere within the brain. No acute intracranial hemorrhage. Few scattered chronic micro hemorrhages noted about the deep gray nuclei, likely hypertensive in nature. Benign arachnoid cyst noted at the anterior right middle cranial fossa. No other mass lesion, mass effect, or midline shift.  Mild ventricular prominence related to global parenchymal volume loss of hydrocephalus. No extra-axial fluid collection. Pituitary gland suprasellar region within normal limits. Midline structures intact. Vascular: Major intracranial vascular flow voids are maintained. Skull and upper cervical spine: Craniocervical junction within normal limits. Bone marrow signal intensity within normal limits. No scalp soft tissue abnormality. Sinuses/Orbits: Globes and orbital soft tissues within normal limits. Paranasal sinuses are largely clear. No significant mastoid effusion. 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 MRA HEAD FINDINGS Anterior circulation: Visualized distal cervical segments of the internal carotid arteries are patent with antegrade flow. Petrous, cavernous, and supraclinoid segments patent without stenosis or other abnormality. A1 segments widely patent. Normal anterior communicating artery complex. Anterior cerebral arteries patent to their distal aspects without high-grade stenosis. No M1 stenosis or occlusion. Normal MCA bifurcations. Severe distal right M2 stenosis, inferior division (series 352, image 12). Distal MCA branches are otherwise well perfused, although demonstrate probable diffuse small vessel atheromatous irregularity. Posterior circulation: Both V4 segments patent to the vertebrobasilar junction without stenosis. Both PICA origins patent and normal. Basilar patent to its distal aspect without stenosis. Superior cerebral arteries grossly patent at their origins. Both PCAs primarily supplied via the basilar. Severe distal right P2 stenosis (series 356, image 11). PCAs otherwise somewhat irregular but perfused to their distal aspects without high-grade stenosis. No intracranial aneurysm. Anatomic variants: None. MRA  NECK FINDINGS Please note that the MRA portion of this exam was unable to be performed due to patient habitus, inability to fit within the coil. IMPRESSION: MRI  HEAD: 1. Patchy small volume acute ischemic nonhemorrhagic left thalamic infarcts as above. 2. Additional 8 mm acute ischemic nonhemorrhagic subcortical infarct involving the anterior left frontal lobe. 3. Small focus of mild diffusion abnormality involving the right frontal corona radiata, likely reflecting evolving subacute small vessel ischemic change. 4. Underlying age-related cerebral atrophy with moderate chronic microvascular ischemic disease, with multiple remote lacunar infarcts involving the bilateral basal ganglia and thalami. MRA HEAD: 1. Negative intracranial MRA for large vessel occlusion. 2. Severe distal right M2 and right P2 stenoses. 3. Diffuse small vessel atheromatous irregularity throughout the intracranial circulation. MRA NECK: Please note that the MRA portion of this exam was unable to be performed due to patient habitus/inability to fit within the coil. Electronically Signed   By: Jeannine Boga M.D.   On: 03/06/2021 04:40   DG Chest Portable 1 View  Result Date: 03/05/2021 CLINICAL DATA:  Hypertension EXAM: PORTABLE CHEST 1 VIEW COMPARISON:  Radiograph 04/05/2015 FINDINGS: Some coarsened reticular changes are noted in the bilateral apices, right greater than left though may be accentuated by patient's exaggerated thoracic kyphotic curvature. Some additional bandlike opacity in the right mid lung and left lung base could reflect atelectasis or scarring. No focal consolidation. No convincing features of edema. No pneumothorax or layering pleural effusion. Calcified aorta. Cardiomediastinal contours are otherwise unremarkable for positioning and portable technique. Telemetry leads overlie the chest. IMPRESSION: 1. Increasing coarse reticular opacities noted towards the lung apices, possibly accentuated by positioning and technique though can reflect acute or chronic infection or inflammatory change. 2. More bandlike opacities in the mid lungs, likely atelectasis or scarring. 3.   Aortic Atherosclerosis (ICD10-I70.0). Electronically Signed   By: Lovena Le M.D.   On: 03/05/2021 20:29   ECHOCARDIOGRAM COMPLETE  Result Date: 03/06/2021    ECHOCARDIOGRAM REPORT   Patient Name:   Jade [name] Date of Exam: 03/06/2021 Medical Rec #:  [id number]         Height:       61.0 in Accession #:    [id number]        Weight:       171.0 lb Date of Birth:  Jun 20, 1956         BSA:          1.767 m Patient Age:    5 years          BP:           204/94 mmHg Patient Gender: F                 HR:           63 bpm. Exam Location:  Inpatient Procedure: 2D Echo, Cardiac Doppler and Color Doppler Indications:    Stroke I63.9  History:        Patient has prior history of Echocardiogram examinations, most                 recent 03/22/2018. COPD and Stroke; Risk Factors:Hypertension,                 Diabetes, Dyslipidemia and Sleep Apnea. CKD.  Sonographer:    Jonelle Sidle Dance Referring Phys: [place]  1. Difficult study due to poor definition of the LV endocardial border. Recommend definity contrast for future studies. Left ventricular ejection fraction, by  estimation, is 55 to 60%. The left ventricle has normal function. Left ventricular endocardial  border not optimally defined to evaluate regional wall motion, but appears grossly normal. There is mild concentric left ventricular hypertrophy. Left ventricular diastolic parameters are consistent with Grade I diastolic dysfunction (impaired relaxation).  2. Right ventricular systolic function is normal. The right ventricular size is normal. Tricuspid regurgitation signal is inadequate for assessing PA pressure.  3. The mitral valve is normal in structure. No evidence of mitral valve regurgitation. No evidence of mitral stenosis.  4. The aortic valve is tricuspid. Aortic valve regurgitation is not visualized. No aortic stenosis is present.  5. The inferior vena cava is normal in size with greater than 50% respiratory variability, suggesting  right atrial pressure of 3 mmHg. Comparison(s): No significant change from prior study. Conclusion(s)/Recommendation(s): No intracardiac source of embolism detected on this transthoracic study. A transesophageal echocardiogram is recommended to exclude cardiac source of embolism if clinically indicated. FINDINGS  Left Ventricle: Difficult study due to poor definition of the LV endocardial border. Recommend definity contrast for future studies. Left ventricular ejection fraction, by estimation, is 55 to 60%. The left ventricle has normal function. Left ventricular endocardial border not optimally defined to evaluate regional wall motion. The left ventricular internal cavity size was normal in size. There is mild concentric left ventricular hypertrophy. Left ventricular diastolic parameters are consistent with Grade I diastolic dysfunction (impaired relaxation). Right Ventricle: The right ventricular size is normal. Right vetricular wall thickness was not well visualized. Right ventricular systolic function is normal. Tricuspid regurgitation signal is inadequate for assessing PA pressure. Left Atrium: Left atrial size was normal in size. Right Atrium: Right atrial size was normal in size. Pericardium: There is no evidence of pericardial effusion. Mitral Valve: The mitral valve is normal in structure. There is mild thickening of the mitral valve leaflet(s). There is mild calcification of the mitral valve leaflet(s). Mild mitral annular calcification. No evidence of mitral valve regurgitation. No evidence of mitral valve stenosis. Tricuspid Valve: The tricuspid valve is normal in structure. Tricuspid valve regurgitation is trivial. Aortic Valve: The aortic valve is tricuspid. Aortic valve regurgitation is not visualized. No aortic stenosis is present. Pulmonic Valve: The pulmonic valve was not well visualized. Pulmonic valve regurgitation is trivial. Aorta: The aortic root and ascending aorta are structurally normal,  with no evidence of dilitation. Venous: The inferior vena cava is normal in size with greater than 50% respiratory variability, suggesting right atrial pressure of 3 mmHg. IAS/Shunts: No atrial level shunt detected by color flow Doppler.  LEFT VENTRICLE PLAX 2D LVIDd:         3.80 cm  Diastology LVIDs:         2.80 cm  LV e' medial:    4.35 cm/s LV PW:         1.10 cm  LV E/e' medial:  12.3 LV IVS:        1.10 cm  LV e' lateral:   4.24 cm/s LVOT diam:     1.90 cm  LV E/e' lateral: 12.6 LV SV:         43 LV SV Index:   24 LVOT Area:     2.84 cm  RIGHT VENTRICLE            IVC RV Basal diam:  1.50 cm    IVC diam: 1.40 cm RV S prime:     9.90 cm/s TAPSE (M-mode): 1.2 cm LEFT ATRIUM  Index       RIGHT ATRIUM          Index LA diam:        3.70 cm 2.09 cm/m  RA Area:     7.76 cm LA Vol (A2C):   27.7 ml 15.68 ml/m RA Volume:   11.30 ml 6.39 ml/m LA Vol (A4C):   15.2 ml 8.60 ml/m LA Biplane Vol: 22.1 ml 12.51 ml/m  AORTIC VALVE LVOT Vmax:   78.30 cm/s LVOT Vmean:  53.100 cm/s LVOT VTI:    0.150 m  AORTA Ao Root diam: 3.00 cm Ao Asc diam:  3.30 cm MITRAL VALVE MV Area (PHT): 2.01 cm    SHUNTS MV Decel Time: 377 msec    Systemic VTI:  0.15 m MV E velocity: 53.30 cm/s  Systemic Diam: 1.90 cm MV A velocity: 99.80 cm/s MV E/A ratio:  0.53 Gwyndolyn Kaufman MD Electronically signed by Gwyndolyn Kaufman MD Signature Date/Time: 03/06/2021/1:58:54 PM    Final      Transthoracic Echocardiogram - pending  Bilateral Carotid Dopplers - just done as out pt on 6/27: reported <39% bilat  ECG - SR rate   BPM. (See cardiology reading for complete details)    Assessment:  Ms. PRARTHANA PARLIN is a 65 y.o. female with history of HTN, prior stroke, diet controlled DM2, HLD, known carotid disease (refuses CEA) presenting with hypertensive emergency and blurred vision. She did not receive IV t-PA due to outside of time window, low NIH and no LVO.   Stroke: left inferior thalamic small stroke, many old strokes,  small vessel disease.   CT head - moderate chronic microvascular ischemic disease, no infarct seen MRI head - patchy Lt thalamic acute infarct, the subcortical left frontal infarct appears subacute or T2 shine-through. Multiple old infarcts. Severe chronic microvascular dz, atrophy CTA H&N -  severe atherosclerotic dz bilat carotid bifurcations. LICA 69%, similar to 02/2018 CTA neck Carotid Doppler - just done as out pt on 6/27: bilat <39% 2D Echo - pending LDL - 184    Component Value Date/Time   LDLCALC 184 (H) 03/06/2021 0445   HgbA1c - 6.4 VTE prophylaxis - lovenox Diet  prior to admission, now on aspirin 81 mg daily and clopidogrel 75 mg daily Patient counseled to be compliant with her antithrombotic medications Ongoing aggressive stroke risk factor management Therapy recommendations:  pending Disposition:  Pending  Hypertension Home BP meds: microzide 12.5mg ,  Current BP meds: restart home meds today or tomorrow Now Stable, but presented w/htn emergency No further role in permissive htn, needs to tighten to long term goals <130/80 at this time Long-term BP goal normotensive  Hyperlipidemia Home Lipid lowering medication: Fish oil (refuses statin) LDL 184, goal < 70 Current lipid lowering medication: Lipitor 80 mg daily (agreeable for now, but says she won't take it at home because she does not "believe" in it) Continue statin at discharge  Diabetes Home diabetic meds: none  Current diabetic meds: SSI  HgbA1c 6.4, goal < 7.0  Other Stroke Risk Factors Advanced age Former Cigarette smoker Obesity, Body mass index is 32.31 kg/m., recommend weight loss, diet and exercise as appropriate  Hx stroke/TIA Family hx stroke  Coronary artery disease Obstructive sleep apnea, not on Cpap COPD Arthritis- debility and needs to use walker at home  Other Active Problems, Findings and Recommendations Low vision- may need referral to out pt low vision clinic. No driving Freq  falls at home- fall precautions needed Fibromyalgia Diabetic retinopathy  Desiree Metzger-Cihelka, ARNP-C, ANVP-BC Pager: [phone]

## 2021-03-07 NOTE — H&P (Addendum)
Occupational Therapy Evaluation Patient Details Name: Jade [name] MRN: [id number] DOB: April 05, 1956 Today's Date: 03/07/2021    History of Present Illness Pt adm 7/11 with binocular blurry vision. MRI with acute L thalamic , anterior left frontal and possible right frontal and multiple remote lacunar infarcts PMH - DM, CVA, HTN and COPD.   Clinical Impression   PTA patient was living with her spouse in a private residence. Husband works outside of the home during the day. Patient reports Mod I with household mobility with use of SPC vs RW but reports recurrent falls and increased difficulty with LB ADLs prior to admission. Patient currently functioning below baseline demonstrating observed ADLs with Min to Mod A grossly. Patient also limited by deficits listed below including blurry vision, decreased static/dynamic sittings/standing balance and generalized weakness and would benefit from continued acute OT services in prep for safe d/c to next level of care. Recommendation for CIR.      Follow Up Recommendations  CIR    Equipment Recommendations  Other (comment) (Defer to next level of care)    Recommendations for Other Services       Precautions / Restrictions Precautions Precautions: Fall Precaution Comments: High fall risk;  bilateral vertical gaze palsy Restrictions Weight Bearing Restrictions: No      Mobility Bed Mobility Overal bed mobility: Needs Assistance Bed Mobility: Supine to Sit;Sit to Supine     Supine to sit: Min assist Sit to supine: Min assist   General bed mobility comments: Min A with HOB elevated to transition to EOB. Able to complete anterior scoots toward EOB with Min guard.    Transfers Overall transfer level: Needs assistance Equipment used: Rolling walker (2 wheeled) Transfers: Sit to/from Stand Sit to Stand: Min assist;From elevated surface         General transfer comment: Assist for balance once standing. Can boost with light Min A  for elevated surfaces. Min A for stand-pivot x2 with increased time/effort and cues for hand placement.    Balance Overall balance assessment: Needs assistance;History of Falls Sitting-balance support: Bilateral upper extremity supported;Feet unsupported Sitting balance-Leahy Scale: Poor Sitting balance - Comments: UE support and intermittent min assist; requires BUE support on bed surface. Postural control: Right lateral lean Standing balance support: Bilateral upper extremity supported Standing balance-Leahy Scale: Poor Standing balance comment: walker and min assist for static standing. R lateral LOB with sit to stand from commode requiring Mod A to correct.                           ADL either performed or assessed with clinical judgement   ADL Overall ADL's : Needs assistance/impaired                     Lower Body Dressing: Moderate assistance;Sit to/from stand   Toilet Transfer: Moderate assistance;RW Toilet Transfer Details (indicate cue type and reason): Min A to rise from [facility] with increased time/effort. R lateral LOB after sit to stand from [facility] requiring Mod A to correct. Toileting- Clothing Manipulation and Hygiene: Moderate assistance;Sit to/from stand Toileting - Clothing Manipulation Details (indicate cue type and reason): Mod A for hygiene/clothing management with cues for hand/foot placement. Patient with narrow BOS.       General ADL Comments: Patient greatly limited by blurry vision making ADLs and mobility a hardship. Patient also limited by generalized weakness and decreased static/dynamic standing/sitting balance.     Vision Patient Visual Report: Blurring of  vision Vision Assessment?: Yes;Vision impaired- to be further tested in functional context Eye Alignment: Within Functional Limits Ocular Range of Motion: Restricted looking up;Restricted looking down Alignment/Gaze Preference: Within Defined Limits Tracking/Visual Pursuits: Other  (comment) (Unable to track superiorly, inferiorly or diagonally.) Visual Fields: No apparent deficits Additional Comments: Reports blurry vision at all times. Eyes only track horizontally.     Perception     Praxis      Pertinent Vitals/Pain Pain Assessment: 0-10 Pain Score: 3  Pain Location: Back Pain Descriptors / Indicators: Sore Pain Intervention(s): Monitored during session     Hand Dominance Right   Extremity/Trunk Assessment Upper Extremity Assessment Upper Extremity Assessment: Generalized weakness (MMT grossly 4+/5; symmetrical bilaterally)       Cervical / Trunk Assessment Cervical / Trunk Assessment: Other exceptions Cervical / Trunk Exceptions: Large body habitus   Communication Communication Communication: No difficulties   Cognition Arousal/Alertness: Awake/alert Behavior During Therapy: WFL for tasks assessed/performed Overall Cognitive Status: Within Functional Limits for tasks assessed                                     General Comments  VSS on RA.    Exercises     Shoulder Instructions      Home Living Family/patient expects to be discharged to:: Private residence Living Arrangements: Spouse/significant other Available Help at Discharge: Family;Available PRN/intermittently (husband works) Type of Home: House Home Access: Stairs to enter Secretary/administrator of Steps: 1 Entrance Stairs-Rails: None Home Layout: One level     Bathroom Shower/Tub: Chief Strategy Officer: Handicapped height     Home Equipment: Cane - single point;Grab bars - toilet;Grab bars - tub/shower;Walker - 4 wheels          Prior Functioning/Environment Level of Independence: Needs assistance  Gait / Transfers Assistance Needed: modified independent with cane or rollator; ADL's / Homemaking Assistance Needed: Ssponge bathes; frequent falls; reports incrased difficulty with LB ADLs for last several months; husband responsible for  IADLs            OT Problem List: Decreased strength;Impaired balance (sitting and/or standing);Impaired vision/perception;Decreased knowledge of use of DME or AE      OT Treatment/Interventions: Self-care/ADL training;Therapeutic exercise;Neuromuscular education;DME and/or AE instruction;Therapeutic activities;Visual/perceptual remediation/compensation;Patient/family education;Balance training    OT Goals(Current goals can be found in the care plan section) Acute Rehab OT Goals Patient Stated Goal: return home OT Goal Formulation: With patient Time For Goal Achievement: 03/21/21 Potential to Achieve Goals: Good ADL Goals Pt Will Perform Upper Body Dressing: with set-up;sitting Pt Will Perform Lower Body Dressing: with supervision;sit to/from stand;with adaptive equipment Pt Will Transfer to Toilet: with supervision;ambulating;bedside commode Pt Will Perform Toileting - Clothing Manipulation and hygiene: with supervision;sit to/from stand;sitting/lateral leans Additional ADL Goal #1: Patient will maintain static sitting balance at EOB with distant superision A for 8-10 minutes in prep for ADLs and functional mobility. Additional ADL Goal #2: Patient will maintain static standing balance with close supervision A and use of RW in prep for standing grooming tasks at sink level.  OT Frequency: Min 2X/week   Barriers to D/C: Decreased caregiver support  Husband works during the day.       Co-evaluation              AM-PAC OT "6 Clicks" Daily Activity     Outcome Measure Help from another person eating meals?: None Help from another person  taking care of personal grooming?: A Little Help from another person toileting, which includes using toliet, bedpan, or urinal?: A Lot Help from another person bathing (including washing, rinsing, drying)?: A Lot Help from another person to put on and taking off regular upper body clothing?: A Little Help from another person to put on and  taking off regular lower body clothing?: A Lot 6 Click Score: 16   End of Session Equipment Utilized During Treatment: Gait belt;Rolling walker Nurse Communication: Mobility status;Other (comment) (Response to treatment)  Activity Tolerance: Patient tolerated treatment well Patient left: in chair;with call bell/phone within reach;with chair alarm set  OT Visit Diagnosis: Unsteadiness on feet (R26.81);Other abnormalities of gait and mobility (R26.89);Muscle weakness (generalized) (M62.81);Repeated falls (R29.6);Low vision, both eyes (H54.2)                Time: 9381-0175 OT Time Calculation (min): 38 min Charges:  OT General Charges $OT Visit: 1 Visit OT Evaluation $OT Eval Moderate Complexity: 1 Mod OT Treatments $Self Care/Home Management : 8-22 mins  Gable Odonohue H. OTR/L Supplemental OT, Department of rehab services [phone]  Nilza Eaker R H. 03/07/2021, 8:11 AM

## 2021-03-07 NOTE — Evaluation (Signed)
Speech Language Pathology Evaluation Patient Details Name: KARMON ANDIS MRN: [id number] DOB: 08/20/1956 Today's Date: 03/07/2021 Time: 1130-1150 SLP Time Calculation (min) (ACUTE ONLY): 20 min  Problem List:  Patient Active Problem List   Diagnosis Date Noted   Stroke-like symptoms 03/05/2021   Acute CVA (cerebrovascular accident) (HCC) 03/21/2018   Obesity, Class III, BMI 40-49.9 (morbid obesity) (HCC) 03/21/2018   IBS (irritable bowel syndrome) 04/12/2015   Hair loss 10/20/2014   Primary osteoarthritis of left knee 09/29/2014   Visit for screening mammogram 08/30/2014   Routine general medical examination at a health care facility 08/30/2014   Tinea corporis 12/21/2013   Essential hypertension, benign 12/21/2013   Low back pain 08/24/2013   Patient noncompliant with statin medication 02/23/2013   H/O abnormal Pap smear 10/24/2012   Osteopenia 09/26/2012   Diabetic neuropathy, painful (HCC) 05/26/2012   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   Pure hypercholesterolemia 03/20/2012   Chronic venous insufficiency 03/20/2012   Past Medical History:  Past Medical History:  Diagnosis Date   Arthritis    "back, knees, some in my left hip" (03/21/2018)   Chronic kidney disease    "was told I had 25% kidney function in early 2012" (03/21/2018)   COPD (chronic obstructive pulmonary disease) (HCC)    CVA (cerebral vascular accident) (HCC) 03/21/2018   "numb all over; head to toe" (03/21/2018)   Fibromyalgia    History of gout    Hyperlipidemia    Hypertension    Migraine    "used to get them monthly during menopause" (03/21/2018)   Neuromuscular disorder (HCC)    OSA (obstructive sleep apnea)    "don't tolerate the machine" (03/21/2018)   Pneumonia ~ 2007   Type 2 diabetes, diet controlled (HCC)    Past Surgical History:  Past Surgical History:  Procedure Laterality Date   HERNIA REPAIR     LAPAROSCOPIC CHOLECYSTECTOMY  06/2007   "no  UHR w/this" (03/21/2018)   TONSILLECTOMY     UMBILICAL HERNIA REPAIR  2009   HPI:  Pt adm 7/11 with binocular blurry vision. 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 Pt self reports changes in both cognitive linguistics this admission. Apparent expressive aphasia deficits noted in spontanteous conversation during SLP interaction with pt including frequent hesitations, use of fillers (um repetitively), with word finding troubles. Pt can communicate at the phrase and short sentence level at times. Receptive language skills appeared grossly intact. Motor speech skills appeared intact. Pt did exhibit some temporal disorientation to day of month and day of the week. She is also limited by novel vision changes, unable to use environmental visual aides to vision changes. Informal cognitive assessment also revealed slowed thought processsing, reduced executive function skills, decreased problem solving (stated staff hadn't been attentive to needs but admitted not utilizing call light to notify members of care needs). Further cognitive linguistic intervention indicated. SLP to follow up.    SLP Assessment  SLP Recommendation/Assessment: Patient needs continued Speech Lanaguage Pathology Services SLP Visit Diagnosis: Aphasia (R47.01);Cognitive communication deficit (R41.841)    Follow Up Recommendations  Inpatient Rehab    Frequency and Duration min 1 x/week  2 weeks      SLP Evaluation Cognition  Overall Cognitive Status: Impaired/Different from baseline Arousal/Alertness: Awake/alert Orientation Level: Oriented to person;Oriented to place;Disoriented to time (disoriented to day of the week, day of the  month) Attention: Focused;Sustained Focused Attention: Impaired Focused Attention Impairment: Verbal complex;Functional complex Sustained Attention: Impaired Sustained Attention Impairment: Verbal complex;Functional complex Memory: Impaired Memory Impairment: Decreased recall of new information;Decreased short term memory Awareness: Impaired Problem Solving: Impaired Problem Solving Impairment: Verbal complex;Functional complex Executive Function: Organizing;Self Monitoring Organizing: Impaired Self Monitoring: Impaired Safety/Judgment: Impaired       Comprehension  Auditory Comprehension Overall Auditory Comprehension: Appears within functional limits for tasks assessed (pt reports some mild changes, none exhibited during clinical assessment) Yes/No Questions: Within Functional Limits    Expression Expression Primary Mode of Expression: Verbal Verbal Expression Overall Verbal Expression: Impaired Initiation: No impairment Level of Generative/Spontaneous Verbalization: Phrase;Sentence Naming: Impairment Effective Techniques: Semantic cues;Sentence completion Written Expression Dominant Hand: Right   Oral / Motor  Oral Motor/Sensory Function Overall Oral Motor/Sensory Function: Within functional limits Motor Speech Overall Motor Speech: Appears within functional limits for tasks assessed   GO                    Ardyth Gal MA, CCC-SLP Acute Rehabilitation Services   03/07/2021, 12:05 PM

## 2021-03-07 NOTE — Progress Notes (Addendum)
PROGRESS NOTE  Jade [name] WUJ:[id number] DOB: 1956/08/10   PCP: System, Provider Not In  Patient is from: Home.  Lives with husband.  Uses walker and cane at baseline.  DOA: 03/05/2021 LOS: 1  Chief complaints:  Chief Complaint  Patient presents with   Hypertension   Blurred Vision     Brief Narrative / Interim history: 65 year old F with PMH of CVA, left ICA stenosis, MS, diet-controlled DM-2, HTN,?COPD and HLD not on statin presenting with binocular blurry vision, and found to have markedly elevated BP with systolic to 260 and an acute left thalamic, anterior left frontal and possible right frontal and multiple remote lacunar CVA on MRI.  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  MRA neck was not performed due to patient's habitus.  CTA head and neck with 75% left proximal ICA stenosis.  TTE with LVEF of 55 to 60% and G1 DD.  A1c 6.4%.  LDL 184.  Neurology evaluation pending.  Subjective: Seen and examined earlier this morning.  No major events overnight of this morning.  Still with blurry vision.  No other complaints.  She denies headache, numbness, tingling, chest pain, dyspnea, GI or UTI symptoms.  Objective: Vitals:   03/06/21 1830 03/06/21 2009 03/07/21 0454 03/07/21 1430  BP: (!) 183/102 (!) 194/87 (!) 175/95 (!) 167/135  Pulse: 67 63 69 96  Resp: 15 19 18 18   Temp:  98 F (36.7 C) 98.6 F (37 C) 98.4 F (36.9 C)  TempSrc:  Oral Oral   SpO2: 93% 95% 98% 99%  Weight:      Height:        Intake/Output Summary (Last 24 hours) at 03/07/2021 1442 Last data filed at 03/06/2021 2139 Gross per 24 hour  Intake --  Output 200 ml  Net -200 ml   Filed Weights   03/05/21 1744  Weight: 77.6 kg    Examination:  GENERAL: No apparent distress.  Nontoxic. HEENT: MMM.  Vision and hearing grossly intact.  NECK: Supple.  No apparent JVD.  RESP:  No IWOB.  Fair aeration bilaterally. CVS:  RRR. Heart sounds normal.  ABD/GI/GU: BS+. Abd soft, NTND.  MSK/EXT:   Moves extremities. No apparent deformity. No edema.  SKIN: no apparent skin lesion or wound NEURO: Awake, alert and oriented appropriately. Speech clear. Cranial nerves II-XII grossly intact but not able to follow my finger on EOM exam. Motor 5/5 in all muscle groups of UE and LE bilaterally, Normal tone. Light sensation intact in all dermatomes of upper and lower ext bilaterally. Patellar reflex symmetric.  No pronator drift.  Finger to nose intact. PSYCH: Calm. Normal affect.   Procedures:  None  Microbiology summarized: COVID-19 and influenza PCR nonreactive.  Assessment & Plan: Acute CVA-presented with acute binocular blurry vision.  Seems to have vertical and horizontal gaze palsy on exam.  Markedly elevated BP with systolic to 260 on presentation.  MRI brain with acute left thalamic, anterior left frontal and possible right frontal and multiple remote lacunar infarcts.  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  CTA head and neck with 75% left proximal ICA stenosis.  TTE with normal LVEF and G1 DD. A1c 6.4%.  LDL 184. Of note, she has history of multiple sclerosis recently seen by PCP and neurology for loss of strength, vision changes and freq falls, insomnia and urine and fecal incontinence.  Also referred to ophthalmology at that time. -Neurology consulted. -Permissive hypertension given intracranial stenosis -On atorvastatin, aspirin and  Plavix -Continue PT/OT/SLP  -Ophthalmology follow-up? Addendum -As needed labetalol for SBP > 220 and DBP> 110  Left ICA stenosis: CTA head and neck with 75% left proximal ICA stenosis.  It seems she was previously scheduled for CEA in 03/2018 which was cancelled -Vascular surgery consult?  Hypertensive emergency: SBP to 260 when checked by EMS.  On low-dose HCTZ at home.  BP improved. -Would benefit from permissive hypertension to 220/110 given intracranial stenosis -Follow neurology recommendation  Hyperlipidemia -Started high intensity  statin.   Prediabetes: A1c 6.4%. Recent Labs  Lab 03/06/21 1202 03/06/21 1718 03/06/21 2014 03/07/21 0835 03/07/21 1233  GLUCAP 214* 124* 112* 121* 165*  -Continue current insulin regimen -Continue statin  Chronic COPD: Reports history of this but not on medication.  No respiratory distress. -As needed albuterol  History of multiple sclerosis? Per PCP note, confirmed via MRI and CSF 15 years ago.  Ambulatory dysfunction: Uses walker and cane at baseline. -PT/OT eval  Class I obesity Body mass index is 32.31 kg/m.         DVT prophylaxis:  enoxaparin (LOVENOX) injection 40 mg Start: 03/05/21 2100  Code Status: Full code Family Communication: Patient and/or RN.  Patient says her husband does not speak English, and she will update him Level of care: Telemetry Medical Status is: Observation  The patient will require care spanning > 2 midnights and should be moved to inpatient because: Hemodynamically unstable, Ongoing diagnostic testing needed not appropriate for outpatient work up, Unsafe d/c plan, and Inpatient level of care appropriate due to severity of illness  Dispo: The patient is from: Home              Anticipated d/c is to:  To be determined              Patient currently is not medically stable to d/c.   Difficult to place patient No       Consultants:  Neurology   Sch Meds:  Scheduled Meds:  aspirin EC  81 mg Oral Daily   atorvastatin  80 mg Oral Daily   cholecalciferol  5,000 Units Oral Daily   clopidogrel  75 mg Oral Daily   enoxaparin (LOVENOX) injection  40 mg Subcutaneous Q24H   insulin aspart  0-5 Units Subcutaneous QHS   insulin aspart  0-9 Units Subcutaneous TID WC   omega-3 acid ethyl esters  1,000 mg Oral Daily   Continuous Infusions: PRN Meds:.acetaminophen **OR** [DISCONTINUED] acetaminophen (TYLENOL) oral liquid 160 mg/5 mL **OR** [DISCONTINUED] acetaminophen  Antimicrobials: Anti-infectives (From admission, onward)    None         I have personally reviewed the following labs and images: CBC: Recent Labs  Lab 03/05/21 1800  WBC 8.0  NEUTROABS 5.5  HGB 15.6*  HCT 47.3*  MCV 96.3  PLT 328   BMP &GFR Recent Labs  Lab 03/05/21 1800  NA 138  K 3.8  CL 104  CO2 25  GLUCOSE 147*  BUN 21  CREATININE 1.10*  CALCIUM 9.7   Estimated Creatinine Clearance: 47.4 mL/min (A) (by C-G formula based on SCr of 1.1 mg/dL (H)). Liver & Pancreas: Recent Labs  Lab 03/05/21 1800  AST 23  ALT 13  ALKPHOS 77  BILITOT 1.1  PROT 7.1  ALBUMIN 4.1   No results for input(s): LIPASE, AMYLASE in the last 168 hours. No results for input(s): AMMONIA in the last 168 hours. Diabetic: Recent Labs    03/06/21 0445  HGBA1C 6.4*   Recent Labs  Lab 03/06/21 1202 03/06/21 1718 03/06/21 2014 03/07/21 0835 03/07/21 1233  GLUCAP 214* 124* 112* 121* 165*   Cardiac Enzymes: No results for input(s): CKTOTAL, CKMB, CKMBINDEX, TROPONINI in the last 168 hours. No results for input(s): PROBNP in the last 8760 hours. Coagulation Profile: No results for input(s): INR, PROTIME in the last 168 hours. Thyroid Function Tests: No results for input(s): TSH, T4TOTAL, FREET4, T3FREE, THYROIDAB in the last 72 hours. Lipid Profile: Recent Labs    03/06/21 0445  CHOL 271*  HDL 59  LDLCALC 184*  TRIG 141  CHOLHDL 4.6   Anemia Panel: No results for input(s): VITAMINB12, FOLATE, FERRITIN, TIBC, IRON, RETICCTPCT in the last 72 hours. Urine analysis:    Component Value Date/Time   COLORURINE YELLOW 03/08/2015 0855   APPEARANCEUR CLEAR 03/08/2015 0855   LABSPEC 1.015 03/08/2015 0855   PHURINE 6.0 03/08/2015 0855   GLUCOSEU NEGATIVE 03/08/2015 0855   HGBUR NEGATIVE 03/08/2015 0855   BILIRUBINUR NEGATIVE 03/08/2015 0855   KETONESUR NEGATIVE 03/08/2015 0855   PROTEINUR 30 (A) 06/16/2010 2255   UROBILINOGEN 0.2 03/08/2015 0855   NITRITE NEGATIVE 03/08/2015 0855   LEUKOCYTESUR NEGATIVE 03/08/2015 0855   Sepsis  Labs: Invalid input(s): PROCALCITONIN, LACTICIDVEN  Microbiology: Recent Results (from the past 240 hour(s))  Resp Panel by RT-PCR (Flu A&B, Covid) Nasopharyngeal Swab     Status: None   Collection Time: 03/05/21  8:37 PM   Specimen: Nasopharyngeal Swab; Nasopharyngeal(NP) swabs in vial transport medium  Result Value Ref Range Status   SARS Coronavirus 2 by RT PCR NEGATIVE NEGATIVE Final    Comment: (NOTE) SARS-CoV-2 target nucleic acids are NOT DETECTED.  The SARS-CoV-2 RNA is generally detectable in upper respiratory specimens during the acute phase of infection. The lowest concentration of SARS-CoV-2 viral copies this assay can detect is 138 copies/mL. A negative result does not preclude SARS-Cov-2 infection and should not be used as the sole basis for treatment or other patient management decisions. A negative result may occur with  improper specimen collection/handling, submission of specimen other than nasopharyngeal swab, presence of viral mutation(s) within the areas targeted by this assay, and inadequate number of viral copies(<138 copies/mL). A negative result must be combined with clinical observations, patient history, and epidemiological information. The expected result is Negative.  Fact Sheet for Patients:  BloggerCourse.com  Fact Sheet for Healthcare Providers:  SeriousBroker.it  This test is no t yet approved or cleared by the [place] FDA and  has been authorized for detection and/or diagnosis of SARS-CoV-2 by FDA under an Emergency Use Authorization (EUA). This EUA will remain  in effect (meaning this test can be used) for the duration of the COVID-19 declaration under Section 564(b)(1) of the Act, 21 U.S.C.section 360bbb-3(b)(1), unless the authorization is terminated  or revoked sooner.       Influenza A by PCR NEGATIVE NEGATIVE Final   Influenza B by PCR NEGATIVE NEGATIVE Final    Comment:  (NOTE) The Xpert Xpress SARS-CoV-2/FLU/RSV plus assay is intended as an aid in the diagnosis of influenza from Nasopharyngeal swab specimens and should not be used as a sole basis for treatment. Nasal washings and aspirates are unacceptable for Xpert Xpress SARS-CoV-2/FLU/RSV testing.  Fact Sheet for Patients: BloggerCourse.com  Fact Sheet for Healthcare Providers: SeriousBroker.it  This test is not yet approved or cleared by the [place] FDA and has been authorized for detection and/or diagnosis of SARS-CoV-2 by FDA under an Emergency Use Authorization (EUA). This EUA will remain in effect (meaning this  test can be used) for the duration of the COVID-19 declaration under Section 564(b)(1) of the Act, 21 U.S.C. section 360bbb-3(b)(1), unless the authorization is terminated or revoked.  Performed at [facility], [address], [place], [place] [address]     Radiology Studies: CT ANGIO HEAD NECK W WO CM  Result Date: 03/06/2021 CLINICAL DATA:  Hypertension.  Blurred vision.  Question stroke. EXAM: CT ANGIOGRAPHY HEAD AND NECK TECHNIQUE: Multidetector CT imaging of the head and neck was performed using the standard protocol during bolus administration of intravenous contrast. Multiplanar CT image reconstructions and MIPs were obtained to evaluate the vascular anatomy. Carotid stenosis measurements (when applicable) are obtained utilizing NASCET criteria, using the distal internal carotid diameter as the denominator. CONTRAST:  39mL OMNIPAQUE IOHEXOL 350 MG/ML SOLN COMPARISON:  MRI earlier same day showing left thalamic infarction in small infarctions in the left cerebral hemisphere. FINDINGS: CT HEAD FINDINGS Brain: Generalized atrophy. No focal abnormality seen affecting the brainstem or cerebellum by CT. 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 There are extensive  chronic small-vessel ischemic changes throughout the hemispheric white matter. Old small vessel infarction left thalamus. No acute finding by CT. No hemorrhage, hydrocephalus or subdural collection. Vascular: There is atherosclerotic calcification of the major vessels at the base of the brain. Skull: Negative Sinuses: Clear Orbits: Normal Review of the MIP images confirms the above findings CTA NECK FINDINGS Aortic arch: Aortic atherosclerotic calcification. Branching pattern is normal without flow limiting origin stenosis. Right carotid system: Common carotid artery is widely patent to the bifurcation. There is heavily calcified plaque at the carotid bifurcation and ICA bulb. Minimal diameter is 4 mm. Compared to a more distal cervical ICA diameter of 4 mm, there is no stenosis. Left carotid system: Common carotid artery shows scattered plaque but is widely patent to the bifurcation. There is calcified plaque at the carotid bifurcation and ICA bulb. Minimal diameter of the proximal ICA is 1 mm. Compared to a more distal cervical ICA diameter of 4 mm, this indicates a 75% stenosis. Vertebral arteries: Both vertebral arteries show calcified plaque at the origin but without stenosis. Beyond that, both vertebral arteries are patent through the cervical region to the foramen magnum. Skeleton: Exaggerated cervical lordosis. Degenerative spondylosis at C3-4 with fusion. Other neck: No soft tissue mass or lymphadenopathy. Upper chest: Lung apices are clear. Review of the MIP images confirms the above findings CTA HEAD FINDINGS Anterior circulation: Both internal carotid arteries are patent through the skull base and siphon regions. No siphon stenosis. The anterior and middle cerebral vessels are patent without large or medium vessel occlusion. More distal branch vessels show atherosclerotic narrowing and irregularity. No correctable proximal stenosis. No aneurysm or vascular malformation. Posterior circulation: Both  vertebral arteries are patent through the foramen magnum to the basilar. No basilar stenosis. Posterior circulation branch vessels are patent. More distal PCA branches show atherosclerotic irregularity, most severe in the P1 P2 junction regions on both sides. Venous sinuses: Patent and normal. Anatomic variants: None significant. Review of the MIP images confirms the above findings IMPRESSION: No large vessel occlusion. Aortic atherosclerosis. Atherosclerotic disease at both carotid bifurcations. No measurable stenosis on the right. 75% stenosis of the proximal ICA on the left. Intracranial vessels show distal vessel atherosclerotic narrowing and irregularity. No correctable proximal stenosis. Electronically Signed   By: Paulina Fusi M.D.   On: 03/06/2021 18:02      Maryah Marinaro T. Yamile Roedl [facility]ist  If 7PM-7AM, please contact night-coverage [email or web address] 03/07/2021, 2:42 PM

## 2021-03-08 DIAGNOSIS — E876 Hypokalemia: Secondary | ICD-10-CM

## 2021-03-08 LAB — CBC
HCT: 39.7 % (ref 36.0–46.0)
Hemoglobin: 13.2 g/dL (ref 12.0–15.0)
MCH: 31.9 pg (ref 26.0–34.0)
MCHC: 33.2 g/dL (ref 30.0–36.0)
MCV: 95.9 fL (ref 80.0–100.0)
Platelets: 291 10*3/uL (ref 150–400)
RBC: 4.14 MIL/uL (ref 3.87–5.11)
RDW: 13 % (ref 11.5–15.5)
WBC: 8.1 10*3/uL (ref 4.0–10.5)
nRBC: 0 % (ref 0.0–0.2)

## 2021-03-08 LAB — GLUCOSE, CAPILLARY
Glucose-Capillary: 118 mg/dL — ABNORMAL HIGH (ref 70–99)
Glucose-Capillary: 153 mg/dL — ABNORMAL HIGH (ref 70–99)
Glucose-Capillary: 129 mg/dL — ABNORMAL HIGH (ref 70–99)
Glucose-Capillary: 140 mg/dL — ABNORMAL HIGH (ref 70–99)

## 2021-03-08 LAB — RENAL FUNCTION PANEL
Albumin: 3.3 g/dL — ABNORMAL LOW (ref 3.5–5.0)
Anion gap: 6 (ref 5–15)
BUN: 20 mg/dL (ref 8–23)
CO2: 28 mmol/L (ref 22–32)
Calcium: 9.4 mg/dL (ref 8.9–10.3)
Chloride: 106 mmol/L (ref 98–111)
Creatinine, Ser: 1.27 mg/dL — ABNORMAL HIGH (ref 0.44–1.00)
GFR, Estimated: 47 mL/min — ABNORMAL LOW (ref >60)
Glucose, Bld: 128 mg/dL — ABNORMAL HIGH (ref 70–99)
Phosphorus: 3.2 mg/dL (ref 2.5–4.6)
Potassium: 3.2 mmol/L — ABNORMAL LOW (ref 3.5–5.1)
Sodium: 140 mmol/L (ref 135–145)

## 2021-03-08 LAB — MAGNESIUM: Magnesium: 2 mg/dL (ref 1.7–2.4)

## 2021-03-08 MED ORDER — POTASSIUM CHLORIDE CRYS ER 20 MEQ PO TBCR
40.0000 meq | EXTENDED_RELEASE_TABLET | ORAL | Status: AC
  Administered 2021-03-08 (×2): 40 meq via ORAL
  Filled 2021-03-08 (×2): qty 2

## 2021-03-08 MED ORDER — AMLODIPINE BESYLATE 5 MG PO TABS
5.0000 mg | ORAL_TABLET | Freq: Every day | ORAL | Status: DC
  Administered 2021-03-08: 5 mg via ORAL
  Filled 2021-03-08: qty 1

## 2021-03-08 NOTE — Progress Notes (Signed)
Inpatient Rehabilitation Admissions Coordinator   Inpatient rehab consult received. I met with patient at bedside for rehab assessment. Pt lives with her spouse who works 6 am until 4 pm Tuesday through Saturday. She reports numerous falls with the inability to get herself up until spouse arrives home from work. No caregivers available except spouse. Her vision is now more affected per patient . We recommend SNF for prolonged recovery and pt is aware and in agreement. I have alerted acute team and TOC. We will sign off at this time.   , RN, MSN Rehab Admissions Coordinator [phone] 03/08/2021 11:09 AM  

## 2021-03-08 NOTE — Progress Notes (Signed)
PROGRESS NOTE  Jade [name] TIR:[id number] DOB: [birth year]   PCP: System, Provider Not In  Patient is from: Home.  Lives with husband.  Uses walker and cane at baseline.  DOA: 03/05/2021 LOS: 2  Chief complaints:  Chief Complaint  Patient presents with   Hypertension   Blurred Vision     Brief Narrative / Interim history: 65 year old F with PMH of CVA, left ICA stenosis, MS, diet-controlled DM-2, HTN,?COPD and HLD not on statin presenting with binocular blurry vision, and found to have markedly elevated BP with systolic to 260 and an acute left thalamic, anterior left frontal and possible right frontal and multiple remote lacunar CVA on MRI.  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  MRA neck was not performed due to patient's habitus.  CTA head and neck with 75% left proximal ICA stenosis.  TTE with LVEF of 55 to 60% and G1 DD.  A1c 6.4%.  LDL 184.  Neurology consulted and gave recommendation.  Therapy recommended CIR but CIR recommending SNF.  Subjective: Seen and examined earlier this morning.  No major events overnight of this morning.  She thought her vision was better when she woke up but didn't last long.  No other complaints.  She says she does not believe in statin but "will think about it" after we discussed risk and benefits especially in her situation.  Objective: Vitals:   03/08/21 0525 03/08/21 0950 03/08/21 1037 03/08/21 1100  BP: (!) 163/76 (!) 234/68 (!) 205/76 (!) 189/71  Pulse: 66 76  62  Resp: 14 15 17 20   Temp: 98.1 F (36.7 C) 98.1 F (36.7 C)    TempSrc:  Oral    SpO2: 98% 98%    Weight:      Height:       No intake or output data in the 24 hours ending 03/08/21 1249  Filed Weights   03/05/21 1744  Weight: 77.6 kg    Examination: GENERAL: No apparent distress.  Nontoxic. HEENT: MMM.  Vision and hearing grossly intact.  NECK: Supple.  No apparent JVD.  RESP: On RA.  No IWOB.  Fair aeration bilaterally. CVS:  RRR. Heart sounds normal.   ABD/GI/GU: BS+. Abd soft, NTND.  MSK/EXT:  Moves extremities. No apparent deformity. No edema.  SKIN: no apparent skin lesion or wound NEURO: Awake, alert and oriented appropriately. Speech clear. Cranial nerves II-XII grossly intact except for vertical gaze palsy, ?  Nystagmus and blurry vision.  Motor 5/5 in all muscle groups of UE and LE bilaterally, Normal tone. Light sensation intact in all dermatomes of upper and lower ext bilaterally. Patellar reflex symmetric.  No pronator drift.  Finger to nose intact. PSYCH: Calm. Normal affect.   Procedures:  None  Microbiology summarized: COVID-19 and influenza PCR nonreactive.  Assessment & Plan: Acute CVA-presented with acute binocular blurry vision.  Seems to have vertical and horizontal gaze palsy on exam.  Markedly elevated BP with systolic to 260 on presentation.  MRI brain with acute left thalamic, anterior left frontal and possible right frontal and multiple remote lacunar infarcts.  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  CTA head and neck with 75% left proximal ICA stenosis.  TTE with normal LVEF and G1 DD. A1c 6.4%.  LDL 184. Of note, history of "multiple sclerosis" per recent PCP note when she presented with loss of strength, vision changes and freq falls, insomnia and urine and fecal incontinence.  She also has history of retinopathy getting  intraocular injection at ophthalmology office.  Also does not believe in taking statin. -Appreciate neuro recs  -Plavix and aspirin for 3 weeks followed by Plavix alone.  Considered aspirin failure  -Continue high intensity statin  -Neurology to arrange outpatient follow-up locally -Slowly normalize BP-started low-dose amlodipine with as needed labetalol. -Continue PT/OT -Needs outpatient follow-up. -Outpatient follow-up with her ophthalmologist   Left ICA stenosis: CTA head and neck with 75% left proximal ICA stenosis.  It seems she was previously scheduled for CEA in 03/2018 but she  canceled it.  Regardless, she is currently not symptomatic from this. -Outpatient follow-up with vascular surgery  Hypertensive emergency: SBP to 260 when checked by EMS.  On low-dose HCTZ at home.  BP improved. -Started low-dose amlodipine with as needed labetalol. -Gradually normalize BP over the next 4 to 5 days  Hyperlipidemia: LDL 184.  She does not believe in statin but no specific reason but taking while in-house. -Discussed the risk and benefits of statin and encourage her to continue even after discharge   Prediabetes: A1c 6.4%. Recent Labs  Lab 03/07/21 1233 03/07/21 1746 03/07/21 2041 03/08/21 0749 03/08/21 1140  GLUCAP 165* 114* 248* 118* 153*  -Continue current insulin regimen -Continue statin  Chronic COPD: Reports history of this but not on medication.  No respiratory distress. -As needed albuterol  History of multiple sclerosis? Per PCP note, confirmed via MRI and CSF 15 years ago.  Ambulatory dysfunction: Uses walker and cane at baseline. -PT/OT eval  Hypokalemia -Replenish and recheck  Class I obesity Body mass index is 32.31 kg/m.         DVT prophylaxis:  enoxaparin (LOVENOX) injection 40 mg Start: 03/05/21 2100  Code Status: Full code Family Communication: Patient and/or RN.  Patient says her husband does not speak [place], and she will update him Level of care: Telemetry Medical Status is: Inpatient  Remains inpatient appropriate because:Hemodynamically unstable, Unsafe d/c plan, and Inpatient level of care appropriate due to severity of illness  Dispo: The patient is from: Home              Anticipated d/c is to: SNF              Patient currently is not medically stable to d/c.   Difficult to place patient No             Consultants:  Neurology   Sch Meds:  Scheduled Meds:  amLODipine  5 mg Oral Daily   aspirin EC  81 mg Oral Daily   atorvastatin  80 mg Oral Daily   cholecalciferol  5,000 Units Oral Daily   clopidogrel   75 mg Oral Daily   enoxaparin (LOVENOX) injection  40 mg Subcutaneous Q24H   insulin aspart  0-5 Units Subcutaneous QHS   insulin aspart  0-9 Units Subcutaneous TID WC   omega-3 acid ethyl esters  1,000 mg Oral Daily   potassium chloride  40 mEq Oral Q4H   Continuous Infusions: PRN Meds:.acetaminophen **OR** [DISCONTINUED] acetaminophen (TYLENOL) oral liquid 160 mg/5 mL **OR** [DISCONTINUED] acetaminophen, labetalol  Antimicrobials: Anti-infectives (From admission, onward)    None        I have personally reviewed the following labs and images: CBC: Recent Labs  Lab 03/05/21 1800 03/08/21 0045  WBC 8.0 8.1  NEUTROABS 5.5  --   HGB 15.6* 13.2  HCT 47.3* 39.7  MCV 96.3 95.9  PLT 328 291   BMP &GFR Recent Labs  Lab 03/05/21 1800 03/08/21 0045  NA  138 140  K 3.8 3.2*  CL 104 106  CO2 25 28  GLUCOSE 147* 128*  BUN 21 20  CREATININE 1.10* 1.27*  CALCIUM 9.7 9.4  MG  --  2.0  PHOS  --  3.2   Estimated Creatinine Clearance: 41.1 mL/min (A) (by C-G formula based on SCr of 1.27 mg/dL (H)). Liver & Pancreas: Recent Labs  Lab 03/05/21 1800 03/08/21 0045  AST 23  --   ALT 13  --   ALKPHOS 77  --   BILITOT 1.1  --   PROT 7.1  --   ALBUMIN 4.1 3.3*   No results for input(s): LIPASE, AMYLASE in the last 168 hours. No results for input(s): AMMONIA in the last 168 hours. Diabetic: Recent Labs    03/06/21 0445  HGBA1C 6.4*   Recent Labs  Lab 03/07/21 1233 03/07/21 1746 03/07/21 2041 03/08/21 0749 03/08/21 1140  GLUCAP 165* 114* 248* 118* 153*   Cardiac Enzymes: No results for input(s): CKTOTAL, CKMB, CKMBINDEX, TROPONINI in the last 168 hours. No results for input(s): PROBNP in the last 8760 hours. Coagulation Profile: No results for input(s): INR, PROTIME in the last 168 hours. Thyroid Function Tests: No results for input(s): TSH, T4TOTAL, FREET4, T3FREE, THYROIDAB in the last 72 hours. Lipid Profile: Recent Labs    03/06/21 0445  CHOL 271*   HDL 59  LDLCALC 184*  TRIG 141  CHOLHDL 4.6   Anemia Panel: No results for input(s): VITAMINB12, FOLATE, FERRITIN, TIBC, IRON, RETICCTPCT in the last 72 hours. Urine analysis:    Component Value Date/Time   COLORURINE YELLOW 03/08/2015 0855   APPEARANCEUR CLEAR 03/08/2015 0855   LABSPEC 1.015 03/08/2015 0855   PHURINE 6.0 03/08/2015 0855   GLUCOSEU NEGATIVE 03/08/2015 0855   HGBUR NEGATIVE 03/08/2015 0855   BILIRUBINUR NEGATIVE 03/08/2015 0855   KETONESUR NEGATIVE 03/08/2015 0855   PROTEINUR 30 (A) 06/16/2010 2255   UROBILINOGEN 0.2 03/08/2015 0855   NITRITE NEGATIVE 03/08/2015 0855   LEUKOCYTESUR NEGATIVE 03/08/2015 0855   Sepsis Labs: Invalid input(s): PROCALCITONIN, LACTICIDVEN  Microbiology: Recent Results (from the past 240 hour(s))  Resp Panel by RT-PCR (Flu A&B, Covid) Nasopharyngeal Swab     Status: None   Collection Time: 03/05/21  8:37 PM   Specimen: Nasopharyngeal Swab; Nasopharyngeal(NP) swabs in vial transport medium  Result Value Ref Range Status   SARS Coronavirus 2 by RT PCR NEGATIVE NEGATIVE Final    Comment: (NOTE) SARS-CoV-2 target nucleic acids are NOT DETECTED.  The SARS-CoV-2 RNA is generally detectable in upper respiratory specimens during the acute phase of infection. The lowest concentration of SARS-CoV-2 viral copies this assay can detect is 138 copies/mL. A negative result does not preclude SARS-Cov-2 infection and should not be used as the sole basis for treatment or other patient management decisions. A negative result may occur with  improper specimen collection/handling, submission of specimen other than nasopharyngeal swab, presence of viral mutation(s) within the areas targeted by this assay, and inadequate number of viral copies(<138 copies/mL). A negative result must be combined with clinical observations, patient history, and epidemiological information. The expected result is Negative.  Fact Sheet for Patients:   BloggerCourse.com  Fact Sheet for Healthcare Providers:  SeriousBroker.it  This test is no t yet approved or cleared by the [place] FDA and  has been authorized for detection and/or diagnosis of SARS-CoV-2 by FDA under an Emergency Use Authorization (EUA). This EUA will remain  in effect (meaning this test can be used) for the duration  of the COVID-19 declaration under Section 564(b)(1) of the Act, 21 U.S.C.section 360bbb-3(b)(1), unless the authorization is terminated  or revoked sooner.       Influenza A by PCR NEGATIVE NEGATIVE Final   Influenza B by PCR NEGATIVE NEGATIVE Final    Comment: (NOTE) The Xpert Xpress SARS-CoV-2/FLU/RSV plus assay is intended as an aid in the diagnosis of influenza from Nasopharyngeal swab specimens and should not be used as a sole basis for treatment. Nasal washings and aspirates are unacceptable for Xpert Xpress SARS-CoV-2/FLU/RSV testing.  Fact Sheet for Patients: BloggerCourse.com  Fact Sheet for Healthcare Providers: SeriousBroker.it  This test is not yet approved or cleared by the [place] FDA and has been authorized for detection and/or diagnosis of SARS-CoV-2 by FDA under an Emergency Use Authorization (EUA). This EUA will remain in effect (meaning this test can be used) for the duration of the COVID-19 declaration under Section 564(b)(1) of the Act, 21 U.S.C. section 360bbb-3(b)(1), unless the authorization is terminated or revoked.  Performed at [facility], [address] [place], [place] [address]     Radiology Studies: No results found.    Layah Skousen T. Ruel Dimmick [facility]  If 7PM-7AM, please contact night-coverage [email or web address] 03/08/2021, 12:49 PM

## 2021-03-08 NOTE — Progress Notes (Signed)
Physical Therapy Treatment Patient Details Name: Jade [name] MRN: [id number] DOB: Aug 19, 1956 Today's Date: 03/08/2021    History of Present Illness Pt adm 7/11 with binocular blurry vision. MRI with acute L thalamic , anterior left frontal and possible right frontal and multiple remote lacunar infarcts PMH - DM, CVA, HTN and COPD.    PT Comments    Patient seen for gait training. Continues with rt lean and short step length on left with narrow base of support. Requires facilitation for wt-shifting over LLE and max cues for sequencing with legs. Pt with increasing lean as she fatigued (after ~20 ft) and when stepping backwards. Noted CIR has found pt to not have enough family support and discharge plan updated to SNF.      Follow Up Recommendations  SNF (does not have enough family support for CIR)     Equipment Recommendations  None recommended by PT    Recommendations for Other Services       Precautions / Restrictions Precautions Precautions: Fall Precaution Comments: High fall risk;  bilateral vertical gaze palsy    Mobility  Bed Mobility               General bed mobility comments: up on BSC on arrival    Transfers Overall transfer level: Needs assistance Equipment used: Rolling walker (2 wheeled) Transfers: Sit to/from [employer] Sit to Stand: Min guard Stand pivot transfers: Min assist       General transfer comment: vc for hand placement with both standing and sitting; no imbalance noted sit to stand; +imbalance with stand-pivot from [facility] to recliner  Ambulation/Gait Ambulation/Gait assistance: Min assist Gait Distance (Feet): 25 Feet Assistive device: Rolling walker (2 wheeled) Gait Pattern/deviations: Step-through pattern;Decreased step length - right;Decreased dorsiflexion - right;Decreased weight shift to left;Shuffle;Narrow base of support Gait velocity: decr   General Gait Details: pt tends to lead with LLE (stronger) and  weaker leg trails behind with rt lean occuring; assist to weight shift pt over LLE and cuing to advance RLE first; pt requiring more assist with fatigue and when stepping backwards toward chair   Stairs             Wheelchair Mobility    Modified Rankin (Stroke Patients Only) Modified Rankin (Stroke Patients Only) Pre-Morbid Rankin Score: Moderate disability Modified Rankin: Moderately severe disability     Balance Overall balance assessment: Needs assistance;History of Falls Sitting-balance support: Bilateral upper extremity supported;Feet unsupported Sitting balance-Leahy Scale: Poor Sitting balance - Comments: UE support and intermittent min assist; requires BUE support on bed surface. Postural control: Right lateral lean Standing balance support: Bilateral upper extremity supported Standing balance-Leahy Scale: Poor                              Cognition Arousal/Alertness: Awake/alert Behavior During Therapy: WFL for tasks assessed/performed Overall Cognitive Status: Within Functional Limits for tasks assessed                                        Exercises      General Comments General comments (skin integrity, edema, etc.): visually impaired and does better in low lighting. Was able to navigate in room and avoid several obstacles      Pertinent Vitals/Pain Pain Assessment: No/denies pain    Home Living  Prior Function            PT Goals (current goals can now be found in the care plan section) Acute Rehab PT Goals Patient Stated Goal: return home Time For Goal Achievement: 03/20/21 Potential to Achieve Goals: Good Progress towards PT goals: Progressing toward goals    Frequency    Min 3X/week      PT Plan Discharge plan needs to be updated;Frequency needs to be updated    Co-evaluation              AM-PAC PT "6 Clicks" Mobility   Outcome Measure  Help needed turning from  your back to your side while in a flat bed without using bedrails?: A Little Help needed moving from lying on your back to sitting on the side of a flat bed without using bedrails?: A Little Help needed moving to and from a bed to a chair (including a wheelchair)?: A Little Help needed standing up from a chair using your arms (e.g., wheelchair or bedside chair)?: A Little Help needed to walk in hospital room?: A Little Help needed climbing 3-5 steps with a railing? : Total 6 Click Score: 16    End of Session Equipment Utilized During Treatment: Gait belt Activity Tolerance: Patient tolerated treatment well Patient left: with call bell/phone within reach;in chair;with chair alarm set Nurse Communication: Mobility status PT Visit Diagnosis: Other abnormalities of gait and mobility (R26.89);Hemiplegia and hemiparesis Hemiplegia - Right/Left: Right Hemiplegia - dominant/non-dominant: Dominant Hemiplegia - caused by: Cerebral infarction     Time: 0814-4818 PT Time Calculation (min) (ACUTE ONLY): 22 min  Charges:  $Gait Training: 8-22 mins                      Jerolyn Center, PT Pager [phone]    Zena Amos 03/08/2021, 3:37 PM

## 2021-03-08 NOTE — Progress Notes (Signed)
Notified physician of pt elevated BP. Labetalol was given, vitals were rechecked and BP did come down to 205/76 will recheck again soon.

## 2021-03-09 DIAGNOSIS — N1831 Chronic kidney disease, stage 3a: Secondary | ICD-10-CM

## 2021-03-09 LAB — RENAL FUNCTION PANEL
Albumin: 3.5 g/dL (ref 3.5–5.0)
Albumin: 3.6 g/dL (ref 3.5–5.0)
Anion gap: 8 (ref 5–15)
Anion gap: 9 (ref 5–15)
BUN: 16 mg/dL (ref 8–23)
BUN: 21 mg/dL (ref 8–23)
CO2: 23 mmol/L (ref 22–32)
CO2: 25 mmol/L (ref 22–32)
Calcium: 9.4 mg/dL (ref 8.9–10.3)
Calcium: 9.5 mg/dL (ref 8.9–10.3)
Chloride: 111 mmol/L (ref 98–111)
Chloride: 107 mmol/L (ref 98–111)
Creatinine, Ser: 1.13 mg/dL — ABNORMAL HIGH (ref 0.44–1.00)
Creatinine, Ser: 1.2 mg/dL — ABNORMAL HIGH (ref 0.44–1.00)
GFR, Estimated: 54 mL/min — ABNORMAL LOW (ref >60)
GFR, Estimated: 50 mL/min — ABNORMAL LOW (ref >60)
Glucose, Bld: 117 mg/dL — ABNORMAL HIGH (ref 70–99)
Glucose, Bld: 134 mg/dL — ABNORMAL HIGH (ref 70–99)
Phosphorus: 4 mg/dL (ref 2.5–4.6)
Phosphorus: 3.3 mg/dL (ref 2.5–4.6)
Potassium: 3.8 mmol/L (ref 3.5–5.1)
Potassium: 3.7 mmol/L (ref 3.5–5.1)
Sodium: 142 mmol/L (ref 135–145)
Sodium: 141 mmol/L (ref 135–145)

## 2021-03-09 LAB — GLUCOSE, CAPILLARY
Glucose-Capillary: 104 mg/dL — ABNORMAL HIGH (ref 70–99)
Glucose-Capillary: 116 mg/dL — ABNORMAL HIGH (ref 70–99)
Glucose-Capillary: 155 mg/dL — ABNORMAL HIGH (ref 70–99)
Glucose-Capillary: 118 mg/dL — ABNORMAL HIGH (ref 70–99)

## 2021-03-09 LAB — MAGNESIUM
Magnesium: 2 mg/dL (ref 1.7–2.4)
Magnesium: 1.8 mg/dL (ref 1.7–2.4)

## 2021-03-09 MED ORDER — AMLODIPINE BESYLATE 10 MG PO TABS
10.0000 mg | ORAL_TABLET | Freq: Every day | ORAL | Status: DC
  Administered 2021-03-09 – 2021-03-14 (×6): 10 mg via ORAL
  Filled 2021-03-09 (×6): qty 1

## 2021-03-09 MED ORDER — HYDROCHLOROTHIAZIDE 12.5 MG PO CAPS
12.5000 mg | ORAL_CAPSULE | Freq: Every day | ORAL | Status: DC
  Administered 2021-03-09 – 2021-03-14 (×6): 12.5 mg via ORAL
  Filled 2021-03-09 (×6): qty 1

## 2021-03-09 MED ORDER — LABETALOL HCL 5 MG/ML IV SOLN
10.0000 mg | INTRAVENOUS | Status: DC | PRN
  Administered 2021-03-10 (×2): 10 mg via INTRAVENOUS
  Filled 2021-03-09 (×4): qty 4

## 2021-03-09 NOTE — Progress Notes (Addendum)
Physical Therapy Treatment Patient Details Name: Jade [name] MRN: [id number] DOB: August 27, 1956 Today's Date: 03/09/2021    History of Present Illness Pt adm 7/11 with binocular blurry vision. MRI with acute L thalamic , anterior left frontal and possible right frontal and multiple remote lacunar infarcts PMH - DM, CVA, HTN and COPD.    PT Comments    Patient continues with incr wt-shift/lean to her rt (weak) side and reports she feels she is beyond midline towards her left when she is assisted to midline. Repeated reaching laterally with LUE to force left wt-shift prior to ambulation. Patient required cues for sequencing to maximize left wt-shift and balance.     Follow Up Recommendations  SNF (does not have enough family support for CIR)     Equipment Recommendations  None recommended by PT    Recommendations for Other Services       Precautions / Restrictions Precautions Precautions: Fall Precaution Comments: High fall risk;  bilateral vertical gaze palsy Restrictions Weight Bearing Restrictions: No    Mobility  Bed Mobility Overal bed mobility: Needs Assistance             General bed mobility comments: Seated in recliner upon entry.    Transfers Overall transfer level: Needs assistance Equipment used: Rolling walker (2 wheeled) Transfers: Sit to/from [employer] Sit to Stand: Min guard         General transfer comment: vc for hand placement with both standing and sitting; no imbalance noted sit to stand; imbalance x 1 reaching to sit down  Ambulation/Gait Ambulation/Gait assistance: Min assist Gait Distance (Feet): 25 Feet (seated rest, 25) Assistive device: Rolling walker (2 wheeled) Gait Pattern/deviations: Step-through pattern;Decreased step length - right;Decreased dorsiflexion - right;Decreased weight shift to left;Shuffle;Narrow base of support Gait velocity: decr   General Gait Details: pt tends to lead with LLE (stronger) and  weaker leg trails behind with rt lean occuring; assist to weight shift pt over LLE and cuing to advance RLE first--pt better able to recall and continue this pattern today with only min cues needed when she would get out of sequence. Second walk pt was minguard assist with cues   Stairs             Wheelchair Mobility    Modified Rankin (Stroke Patients Only) Modified Rankin (Stroke Patients Only) Pre-Morbid Rankin Score: Moderate disability Modified Rankin: Moderately severe disability     Balance Overall balance assessment: Needs assistance;History of Falls Sitting-balance support: Bilateral upper extremity supported;Feet unsupported Sitting balance-Leahy Scale: Poor Sitting balance - Comments: UE support and intermittent min assist; requires BUE support on bed surface. Postural control: Right lateral lean Standing balance support: Bilateral upper extremity supported Standing balance-Leahy Scale: Poor Standing balance comment: walker and min assist for static standing. R lateral LOB with sit to stand from commode requiring Mod A to correct.                            Cognition Arousal/Alertness: Awake/alert Behavior During Therapy: WFL for tasks assessed/performed Overall Cognitive Status: Within Functional Limits for tasks assessed                                        Exercises Other Exercises Other Exercises: pre-gait exercises focusing on shifting over LLE (reaching out to side for objects to force wt-shifting  General Comments General comments (skin integrity, edema, etc.): reports seeing double and beginning to feel nauseated with second round of walking; OT in at end of session and briefly discussed      Pertinent Vitals/Pain Pain Assessment: No/denies pain    Home Living                      Prior Function            PT Goals (current goals can now be found in the care plan section) Acute Rehab PT Goals Patient  Stated Goal: return home PT Goal Formulation: With patient Time For Goal Achievement: 03/20/21 Potential to Achieve Goals: Good Progress towards PT goals: Progressing toward goals    Frequency    Min 3X/week      PT Plan Current plan remains appropriate    Co-evaluation              AM-PAC PT "6 Clicks" Mobility   Outcome Measure  Help needed turning from your back to your side while in a flat bed without using bedrails?: A Little Help needed moving from lying on your back to sitting on the side of a flat bed without using bedrails?: A Little Help needed moving to and from a bed to a chair (including a wheelchair)?: A Little Help needed standing up from a chair using your arms (e.g., wheelchair or bedside chair)?: A Little Help needed to walk in hospital room?: A Little Help needed climbing 3-5 steps with a railing? : Total 6 Click Score: 16    End of Session Equipment Utilized During Treatment: Gait belt Activity Tolerance: Patient tolerated treatment well Patient left: with call bell/phone within reach;in chair;Other (comment) (with OT) Nurse Communication: Mobility status PT Visit Diagnosis: Other abnormalities of gait and mobility (R26.89);Hemiplegia and hemiparesis Hemiplegia - Right/Left: Right Hemiplegia - dominant/non-dominant: Dominant Hemiplegia - caused by: Cerebral infarction     Time: 6761-9509 PT Time Calculation (min) (ACUTE ONLY): 33 min  Charges:  $Gait Training: 23-37 mins                      Jerolyn Center, PT Pager [phone]    Zena Amos 03/09/2021, 11:58 AM

## 2021-03-09 NOTE — Progress Notes (Signed)
PROGRESS NOTE  Jade [name] JHE:[id number] DOB: November 04, 1955   PCP: System, Provider Not In  Patient is from: Home.  Lives with husband.  Uses walker and cane at baseline.  DOA: 03/05/2021 LOS: 3  Chief complaints:  Chief Complaint  Patient presents with   Hypertension   Blurred Vision     Brief Narrative / Interim history: 65 year old F with PMH of CVA, left ICA stenosis, MS, diet-controlled DM-2, HTN,?COPD and HLD not on statin presenting with binocular blurry vision, and found to have markedly elevated BP with systolic to 260 and an acute left thalamic, anterior left frontal and possible right frontal and multiple remote lacunar CVA on MRI.  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  MRA neck was not performed due to patient's habitus.  CTA head and neck with 75% left proximal ICA stenosis.  TTE with LVEF of 55 to 60% and G1 DD.  A1c 6.4%.  LDL 184.  Neurology consulted and gave recommendation.  Therapy recommended CIR but CIR recommending SNF.   Subjective: Seen and examined earlier this morning.  No major events overnight of this morning.  She says her vision was clear when she wakes up in the morning but that does not last long.  No other complaints.  She denies headache, focal numbness, tingling or weakness.  Denies chest pain or dyspnea.  Denies GI or UTI symptoms.  Objective: Vitals:   03/08/21 2149 03/09/21 0613 03/09/21 1336 03/09/21 1436  BP: (!) 265/92 (!) 170/74 (!) 228/93 (!) 181/86  Pulse: 73 (!) 49 (!) 55 (!) 55  Resp: 20 16 15    Temp: 98.2 F (36.8 C) 98.2 F (36.8 C) 98.1 F (36.7 C)   TempSrc: Oral     SpO2: 100% 99% 100%   Weight:      Height:        Intake/Output Summary (Last 24 hours) at 03/09/2021 1621 Last data filed at 03/08/2021 2136 Gross per 24 hour  Intake --  Output 350 ml  Net -350 ml    Filed Weights   03/05/21 1744  Weight: 77.6 kg    Examination:  GENERAL: No apparent distress.  Nontoxic. HEENT: MMM.  Vision and hearing  grossly intact.  NECK: Supple.  No apparent JVD.  RESP: On RA.  No IWOB.  Fair aeration bilaterally. CVS:  RRR. Heart sounds normal.  ABD/GI/GU: BS+. Abd soft, NTND.  MSK/EXT:  Moves extremities. No apparent deformity. No edema.  SKIN: no apparent skin lesion or wound NEURO: Awake and alert. Oriented appropriately.  No apparent focal neuro deficit other than vertical gaze palsy and possible nystagmus and blurry vision. PSYCH: Calm. Normal affect.   Procedures:  None  Microbiology summarized: COVID-19 and influenza PCR nonreactive.  Assessment & Plan: Acute CVA-presented with acute binocular blurry vision.  Seems to have vertical and horizontal gaze palsy on exam.  Markedly elevated BP with systolic to 260 on presentation.  MRI brain with acute left thalamic, anterior left frontal and possible right frontal and multiple remote lacunar infarcts.  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  CTA head and neck with 75% left proximal ICA stenosis.  TTE with normal LVEF and G1 DD. A1c 6.4%.  LDL 184. Of note, history of "multiple sclerosis" per recent PCP note when she presented with loss of strength, vision changes and freq falls, insomnia and urine and fecal incontinence.  She also has history of retinopathy getting intraocular injection at ophthalmology office.  Also does not  believe in taking statin. -Appreciate neuro recs  -Plavix and aspirin for 3 weeks followed by Plavix alone.  Considered aspirin failure  -Continue high intensity statin  -Neurology to arrange outpatient follow-up locally -Slowly normalize BP-increased amlodipine to 10 mg.  Start low-dose HCTZ.  Continue as needed labetalol -Continue PT/OT -Needs outpatient follow-up with her ophthalmologist.   Left ICA stenosis: CTA head and neck with 75% left proximal ICA stenosis.  It seems she was previously scheduled for CEA in 03/2018 but she canceled it.  Regardless, she is currently not symptomatic from this. -Outpatient  follow-up with vascular surgery  Hypertensive emergency: SBP to 260 when checked by EMS.  On low-dose HCTZ at home.  BP improved. -Increase amlodipine to 10 mg daily -Start low-dose HCTZ. -Continue as needed labetalol-changed parameters.  Hyperlipidemia: LDL 184.  She does not believe in statin but no specific reason but taking while in-house. -Discussed the risk and benefits of statin and encourage her to continue even after discharge  CKD-3A?:  Relatively stable. Recent Labs    03/05/21 1800 03/08/21 0045 03/09/21 0717  BUN 21 20 16   CREATININE 1.10* 1.27* 1.13*  -Continue monitoring  Prediabetes: A1c 6.4%. Recent Labs  Lab 03/08/21 1616 03/08/21 2137 03/09/21 0754 03/09/21 1158 03/09/21 1557  GLUCAP 129* 140* 104* 116* 155*  -Continue current insulin regimen -Continue statin  Chronic COPD: Reports history of this but not on medication.  No respiratory distress. -As needed albuterol  History of multiple sclerosis? Per PCP note, confirmed via MRI and CSF 15 years ago.  Ambulatory dysfunction: Uses walker and cane at baseline. -PT/OT eval  Hypokalemia: Resolved.  Class I obesity Body mass index is 32.31 kg/m.         DVT prophylaxis:  enoxaparin (LOVENOX) injection 40 mg Start: 03/05/21 2100  Code Status: Full code Family Communication: Patient and/or RN.  Patient says her husband does not speak 2101, and she will update him Level of care: Telemetry Medical Status is: Inpatient  Remains inpatient appropriate because:Hemodynamically unstable, Unsafe d/c plan, and Inpatient level of care appropriate due to severity of illness  Dispo: The patient is from: Home              Anticipated d/c is to: SNF              Patient currently is not medically stable to d/c.   Difficult to place patient No             Consultants:  Neurology   Sch Meds:  Scheduled Meds:  amLODipine  10 mg Oral Daily   aspirin EC  81 mg Oral Daily   atorvastatin  80  mg Oral Daily   cholecalciferol  5,000 Units Oral Daily   clopidogrel  75 mg Oral Daily   enoxaparin (LOVENOX) injection  40 mg Subcutaneous Q24H   hydrochlorothiazide  12.5 mg Oral Daily   insulin aspart  0-5 Units Subcutaneous QHS   insulin aspart  0-9 Units Subcutaneous TID WC   omega-3 acid ethyl esters  1,000 mg Oral Daily   Continuous Infusions: PRN Meds:.acetaminophen **OR** [DISCONTINUED] acetaminophen (TYLENOL) oral liquid 160 mg/5 mL **OR** [DISCONTINUED] acetaminophen, labetalol  Antimicrobials: Anti-infectives (From admission, onward)    None        I have personally reviewed the following labs and images: CBC: Recent Labs  Lab 03/05/21 1800 03/08/21 0045  WBC 8.0 8.1  NEUTROABS 5.5  --   HGB 15.6* 13.2  HCT 47.3* 39.7  MCV 96.3 95.9  PLT 328 291   BMP &GFR Recent Labs  Lab 03/05/21 1800 03/08/21 0045 03/09/21 0717  NA 138 140 142  K 3.8 3.2* 3.8  CL 104 106 111  CO2 25 28 23   GLUCOSE 147* 128* 117*  BUN 21 20 16   CREATININE 1.10* 1.27* 1.13*  CALCIUM 9.7 9.4 9.4  MG  --  2.0 2.0  PHOS  --  3.2 4.0   Estimated Creatinine Clearance: 46.2 mL/min (A) (by C-G formula based on SCr of 1.13 mg/dL (H)). Liver & Pancreas: Recent Labs  Lab 03/05/21 1800 03/08/21 0045 03/09/21 0717  AST 23  --   --   ALT 13  --   --   ALKPHOS 77  --   --   BILITOT 1.1  --   --   PROT 7.1  --   --   ALBUMIN 4.1 3.3* 3.5   No results for input(s): LIPASE, AMYLASE in the last 168 hours. No results for input(s): AMMONIA in the last 168 hours. Diabetic: No results for input(s): HGBA1C in the last 72 hours.  Recent Labs  Lab 03/08/21 1616 03/08/21 2137 03/09/21 0754 03/09/21 1158 03/09/21 1557  GLUCAP 129* 140* 104* 116* 155*   Cardiac Enzymes: No results for input(s): CKTOTAL, CKMB, CKMBINDEX, TROPONINI in the last 168 hours. No results for input(s): PROBNP in the last 8760 hours. Coagulation Profile: No results for input(s): INR, PROTIME in the last 168  hours. Thyroid Function Tests: No results for input(s): TSH, T4TOTAL, FREET4, T3FREE, THYROIDAB in the last 72 hours. Lipid Profile: No results for input(s): CHOL, HDL, LDLCALC, TRIG, CHOLHDL, LDLDIRECT in the last 72 hours.  Anemia Panel: No results for input(s): VITAMINB12, FOLATE, FERRITIN, TIBC, IRON, RETICCTPCT in the last 72 hours. Urine analysis:    Component Value Date/Time   COLORURINE YELLOW 03/08/2015 0855   APPEARANCEUR CLEAR 03/08/2015 0855   LABSPEC 1.015 03/08/2015 0855   PHURINE 6.0 03/08/2015 0855   GLUCOSEU NEGATIVE 03/08/2015 0855   HGBUR NEGATIVE 03/08/2015 0855   BILIRUBINUR NEGATIVE 03/08/2015 0855   KETONESUR NEGATIVE 03/08/2015 0855   PROTEINUR 30 (A) 06/16/2010 2255   UROBILINOGEN 0.2 03/08/2015 0855   NITRITE NEGATIVE 03/08/2015 0855   LEUKOCYTESUR NEGATIVE 03/08/2015 0855   Sepsis Labs: Invalid input(s): PROCALCITONIN, LACTICIDVEN  Microbiology: Recent Results (from the past 240 hour(s))  Resp Panel by RT-PCR (Flu A&B, Covid) Nasopharyngeal Swab     Status: None   Collection Time: 03/05/21  8:37 PM   Specimen: Nasopharyngeal Swab; Nasopharyngeal(NP) swabs in vial transport medium  Result Value Ref Range Status   SARS Coronavirus 2 by RT PCR NEGATIVE NEGATIVE Final    Comment: (NOTE) SARS-CoV-2 target nucleic acids are NOT DETECTED.  The SARS-CoV-2 RNA is generally detectable in upper respiratory specimens during the acute phase of infection. The lowest concentration of SARS-CoV-2 viral copies this assay can detect is 138 copies/mL. A negative result does not preclude SARS-Cov-2 infection and should not be used as the sole basis for treatment or other patient management decisions. A negative result may occur with  improper specimen collection/handling, submission of specimen other than nasopharyngeal swab, presence of viral mutation(s) within the areas targeted by this assay, and inadequate number of viral copies(<138 copies/mL). A negative  result must be combined with clinical observations, patient history, and epidemiological information. The expected result is Negative.  Fact Sheet for Patients:  05/09/2015  Fact Sheet for Healthcare Providers:  05/06/21  This test is no t yet approved or cleared by the  [place] [employer] and  has been authorized for detection and/or diagnosis of SARS-CoV-2 by FDA under an TEFL teacher (EUA). This EUA will remain  in effect (meaning this test can be used) for the duration of the COVID-19 declaration under Section 564(b)(1) of the Act, 21 U.S.C.section 360bbb-3(b)(1), unless the authorization is terminated  or revoked sooner.       Influenza A by PCR NEGATIVE NEGATIVE Final   Influenza B by PCR NEGATIVE NEGATIVE Final    Comment: (NOTE) The Xpert Xpress SARS-CoV-2/FLU/RSV plus assay is intended as an aid in the diagnosis of influenza from Nasopharyngeal swab specimens and should not be used as a sole basis for treatment. Nasal washings and aspirates are unacceptable for Xpert Xpress SARS-CoV-2/FLU/RSV testing.  Fact Sheet for Patients: BloggerCourse.com  Fact Sheet for Healthcare Providers: SeriousBroker.it  This test is not yet approved or cleared by the [place] FDA and has been authorized for detection and/or diagnosis of SARS-CoV-2 by FDA under an Emergency Use Authorization (EUA). This EUA will remain in effect (meaning this test can be used) for the duration of the COVID-19 declaration under Section 564(b)(1) of the Act, 21 U.S.C. section 360bbb-3(b)(1), unless the authorization is terminated or revoked.  Performed at [facility], [address], [place], [place] [address]     Radiology Studies: No results found.    Marsh Heckler T. Nazarene B[facility]  If 7PM-7AM, please contact night-coverage [email or web address] 03/09/2021,  4:21 PM

## 2021-03-09 NOTE — NC FL2 (Addendum)
[place] MEDICAID FL2 LEVEL OF CARE SCREENING TOOL     IDENTIFICATION  Patient Name: Jade [name] Birthdate: [birth year] Sex: female Admission Date (Current Location): 03/05/2021  [facility] and [place] Number:  Producer, television/film/video and Address:  [facility] [place], [address], [place], [place] [address]      Provider Number: [id number]  Attending Physician Name and Address:  Almon Hercules, MD  Relative Name and Phone Number:  Joyice Faster [phone]    Current Level of Care: SNF Recommended Level of Care: Skilled Nursing Facility Prior Approval Number:    Date Approved/Denied:   PASRR Number:   [id number]  Discharge Plan: SNF    Current Diagnoses: Patient Active Problem List   Diagnosis Date Noted   Stroke-like symptoms 03/05/2021   Acute CVA (cerebrovascular accident) (HCC) 03/21/2018   Obesity, Class III, BMI 40-49.9 (morbid obesity) (HCC) 03/21/2018   IBS (irritable bowel syndrome) 04/12/2015   Hair loss 10/20/2014   Primary osteoarthritis of left knee 09/29/2014   Visit for screening mammogram 08/30/2014   Routine general medical examination at a health care facility 08/30/2014   Tinea corporis 12/21/2013   Essential hypertension, benign 12/21/2013   Low back pain 08/24/2013   Patient noncompliant with statin medication 02/23/2013   H/O abnormal Pap smear 10/24/2012   Osteopenia 09/26/2012   Diabetic neuropathy, painful (HCC) 05/26/2012   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   Pure hypercholesterolemia 03/20/2012   Chronic venous insufficiency 03/20/2012    Orientation RESPIRATION BLADDER Height & Weight     Self, Time, Situation, Place  Normal Continent, External catheter Weight: 77.6 kg Height:  5\' 1"  (154.9 cm)  BEHAVIORAL SYMPTOMS/MOOD NEUROLOGICAL BOWEL NUTRITION STATUS     (n/a) Continent Diet  AMBULATORY STATUS COMMUNICATION OF NEEDS Skin   Limited Assist Verbally Normal                        Personal Care Assistance Level of Assistance  Bathing, Feeding, Dressing, Total care Bathing Assistance: Limited assistance Feeding assistance: Limited assistance (set up) Dressing Assistance: Limited assistance Total Care Assistance: Limited assistance   Functional Limitations Info  Sight, Hearing, Speech Sight Info: Impaired Hearing Info: Adequate Speech Info: Adequate    SPECIAL CARE FACTORS FREQUENCY  PT (By licensed PT), OT (By licensed OT)     PT Frequency: 5X OT Frequency: 5X            Contractures Contractures Info: Not present    Additional Factors Info  Code Status, Allergies, Psychotropic, Insulin Sliding Scale, Isolation Precautions, Suctioning Needs Code Status Info: Full Allergies Info: Nickel Psychotropic Info: see d/c summary Insulin Sliding Scale Info: see d/c summary for sliding scale information Isolation Precautions Info: n/a Suctioning Needs: n/a   Current Medications (03/09/2021):  This is the current hospital active medication list Current Facility-Administered Medications  Medication Dose Route Frequency Provider Last Rate Last Admin   acetaminophen (TYLENOL) tablet 650 mg  650 mg Oral Q4H PRN 03/11/2021, DO       amLODipine (NORVASC) tablet 10 mg  10 mg Oral Daily Hillary Bow T, MD   10 mg at 03/09/21 1034   aspirin EC tablet 81 mg  81 mg Oral Daily Metzger-Cihelka, Desiree, NP   81 mg at 03/09/21 1033   atorvastatin (LIPITOR) tablet 80 mg  80 mg Oral Daily 03/11/21 T, MD   80 mg at 03/09/21 1033   cholecalciferol (VITAMIN  D3) tablet 5,000 Units  5,000 Units Oral Daily Hillary Bow, DO   5,000 Units at 03/09/21 1033   clopidogrel (PLAVIX) tablet 75 mg  75 mg Oral Daily Metzger-Cihelka, Desiree, NP   75 mg at 03/09/21 1033   enoxaparin (LOVENOX) injection 40 mg  40 mg Subcutaneous Q24H Lyda Perone M, DO   40 mg at 03/08/21 2154   insulin aspart (novoLOG) injection 0-5 Units  0-5 Units Subcutaneous QHS Candelaria Stagers T, MD   2 Units at 03/07/21 2101   insulin aspart (novoLOG) injection 0-9 Units  0-9 Units Subcutaneous TID WC Candelaria Stagers T, MD   1 Units at 03/08/21 1800   labetalol (NORMODYNE) injection 10 mg  10 mg Intravenous Q2H PRN Candelaria Stagers T, MD   10 mg at 03/08/21 2155   omega-3 acid ethyl esters (LOVAZA) capsule 1,000 mg  1,000 mg Oral Daily Lyda Perone M, DO   1,000 mg at 03/09/21 1033     Discharge Medications: Please see discharge summary for a list of discharge medications.  Relevant Imaging Results:  Relevant Lab Results:   Additional Information SS# [id number]  Beckie Busing, RN

## 2021-03-09 NOTE — TOC Initial Note (Signed)
Transition of Care [facility]) - Initial/Assessment Note    Patient Details  Name: Jade [name] MRN: [id number] Date of Birth: 1956/02/16  [facility] CM/SW Contact:    Beckie Busing, RN Phone Number:[phone]  03/09/2021, 4:04 PM  Clinical Narrative:                 [facility] consulted for patient with SNF recommendations. Patient is agreeable for short term SNF placement. Patient states that she is from home with her husband but her husband has to work and not be able to provide 24 hour care. List of choices provided for the patient.Bed search has been initiated FL2 complete, PASRR [id number], Insurance auth initiated. Bed offers pending. TOC will continue to follow for placement.   Expected Discharge Plan: Skilled Nursing Facility Barriers to Discharge: Continued Medical Work up   Patient Goals and CMS Choice Patient states their goals for this hospitalization and ongoing recovery are:: Wants to get better in order to be able to go home CMS Medicare.gov Compare Post Acute Care list provided to:: Patient Choice offered to / list presented to : Patient  Expected Discharge Plan and Services Expected Discharge Plan: Skilled Nursing Facility In-house Referral: NA Discharge Planning Services: CM Consult Post Acute Care Choice: Skilled Nursing Facility Living arrangements for the past 2 months: Single Family Home                 DME Arranged: N/A DME Agency: NA       HH Arranged: NA HH Agency: NA        Prior Living Arrangements/Services Living arrangements for the past 2 months: Single Family Home Lives with:: Spouse Patient language and need for interpreter reviewed:: Yes Do you feel safe going back to the place where you live?: Yes      Need for Family Participation in Patient Care: Yes (Comment) Care giver support system in place?: No (comment) Current home services:  (none) Criminal Activity/Legal Involvement Pertinent to Current Situation/Hospitalization:  No - Comment as needed  Activities of Daily Living      Permission Sought/Granted   Permission granted to share information with : No              Emotional Assessment Appearance:: Appears stated age Attitude/Demeanor/Rapport: Gracious Affect (typically observed): Accepting Orientation: : Oriented to Self, Oriented to Place, Oriented to  Time, Oriented to Situation Alcohol / Substance Use: Not Applicable Psych Involvement: Yes (comment)  Admission diagnosis:  Hypertensive emergency [I16.1] Stroke-like symptoms [R29.90] Acute CVA (cerebrovascular accident) [facility]) [I63.9] 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] Patient Active Problem List   Diagnosis Date Noted   Stroke-like symptoms 03/05/2021   Acute CVA (cerebrovascular accident) (HCC) 03/21/2018   Obesity, Class III, BMI 40-49.9 (morbid obesity) (HCC) 03/21/2018   IBS (irritable bowel syndrome) 04/12/2015   Hair loss 10/20/2014   Primary osteoarthritis of left knee 09/29/2014   Visit for screening mammogram 08/30/2014   Routine general medical examination at a health care facility 08/30/2014   Tinea corporis 12/21/2013   Essential hypertension, benign 12/21/2013   Low back pain 08/24/2013   Patient noncompliant with statin medication 02/23/2013   H/O abnormal Pap smear 10/24/2012   Osteopenia 09/26/2012   Diabetic neuropathy, painful (HCC) 05/26/2012   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   Pure hypercholesterolemia 03/20/2012   Chronic venous insufficiency 03/20/2012   PCP:  System, Provider Not In Pharmacy:   [facility] [place], [place] - [address] [place] [address] Phone: [phone] Fax: [phone]     Social Determinants of Health (SDOH) Interventions    Readmission Risk Interventions No flowsheet data found.

## 2021-03-09 NOTE — Progress Notes (Signed)
Occupational Therapy Treatment Patient Details Name: Jade [name] MRN: [id number] DOB: [birth year] Today's Date: 03/09/2021    History of present illness Pt adm 7/11 with binocular blurry vision. MRI with acute L thalamic , anterior left frontal and possible right frontal and multiple remote lacunar infarcts PMH - DM, CVA, HTN and COPD.   OT comments  Patient met at conclusion of PT session. OT session with focus on vision as patient reports new onset semi double vision with close gaze and with distant gaze. Restricted gaze noted at time of initial evaluation still present. Glasses from home present in room this date. Occlusion therapy initiated with 51M transpore tape on L lens (patient is L eye dominant). OT will continue to follow acutely. Recommendation updated to SNF rehab as CIR denied given limited family support.    Follow Up Recommendations  SNF;Other (comment) (CIR denied)    Equipment Recommendations  Other (comment) (Defer to next level of care)    Recommendations for Other Services      Precautions / Restrictions Precautions Precautions: Fall Precaution Comments: High fall risk;  bilateral vertical gaze palsy Restrictions Weight Bearing Restrictions: No       Mobility Bed Mobility Overal bed mobility: Needs Assistance             General bed mobility comments: Seated in recliner upon entry.    Transfers Overall transfer level: Needs assistance Equipment used: Rolling walker (2 wheeled) Transfers: Sit to/from [facility] Sit to Stand: Min guard         General transfer comment: vc for hand placement with both standing and sitting; no imbalance noted sit to stand; imbalance x 1 reaching to sit down    Balance Overall balance assessment: Needs assistance;History of Falls Sitting-balance support: Bilateral upper extremity supported;Feet unsupported Sitting balance-Leahy Scale: Poor Sitting balance - Comments: UE support and  intermittent min assist; requires BUE support on bed surface. Postural control: Right lateral lean Standing balance support: Bilateral upper extremity supported Standing balance-Leahy Scale: Poor Standing balance comment: walker and min assist for static standing. R lateral LOB with sit to stand from commode requiring Mod A to correct.                           ADL either performed or assessed with clinical judgement   ADL                                         General ADL Comments: Patient greatly limited by blurry vision making ADLs and mobility a hardship. Patient also limited by generalized weakness and decreased static/dynamic standing/sitting balance.     Vision       Perception     Praxis      Cognition Arousal/Alertness: Awake/alert Behavior During Therapy: WFL for tasks assessed/performed Overall Cognitive Status: Within Functional Limits for tasks assessed                                          Exercises Exercises: Other exercises Other Exercises Other Exercises: Vision exercises; education on use of Ipad to address visual perception.   Shoulder Instructions       General Comments Patient reporting semi double vision with one image on top of the other this  date. This is new since initial evaluation. Education provided on occluding glasses with 48M transpore tape. Patient reports having glasses but has not had an eye exam in the past 5 years. Patient reports improvement with double vision after occlusion of glasses. Patient notes difficulty typing in password on Ipad. With observation patient undershooting target prior to occlusion therapy. Continued difficulty after occlusion but noted improvement.    Pertinent Vitals/ Pain       Pain Assessment: No/denies pain  Home Living                                          Prior Functioning/Environment              Frequency  Min 2X/week         Progress Toward Goals  OT Goals(current goals can now be found in the care plan section)     Acute Rehab OT Goals Patient Stated Goal: return home OT Goal Formulation: With patient Time For Goal Achievement: 03/21/21 Potential to Achieve Goals: Good ADL Goals Pt Will Perform Upper Body Dressing: with set-up;sitting Pt Will Perform Lower Body Dressing: with supervision;sit to/from stand;with adaptive equipment Pt Will Transfer to Toilet: with supervision;ambulating;bedside commode Pt Will Perform Toileting - Clothing Manipulation and hygiene: with supervision;sit to/from stand;sitting/lateral leans Additional ADL Goal #1: Patient will maintain static sitting balance at EOB with distant superision A for 8-10 minutes in prep for ADLs and functional mobility. Additional ADL Goal #2: Patient will maintain static standing balance with close supervision A and use of RW in prep for standing grooming tasks at sink level.  Plan Discharge plan remains appropriate;Frequency remains appropriate    Co-evaluation                 AM-PAC OT "6 Clicks" Daily Activity     Outcome Measure   Help from another person eating meals?: None Help from another person taking care of personal grooming?: A Little Help from another person toileting, which includes using toliet, bedpan, or urinal?: A Lot Help from another person bathing (including washing, rinsing, drying)?: A Lot Help from another person to put on and taking off regular upper body clothing?: A Little Help from another person to put on and taking off regular lower body clothing?: A Lot 6 Click Score: 16    End of Session    OT Visit Diagnosis: Unsteadiness on feet (R26.81);Other abnormalities of gait and mobility (R26.89);Muscle weakness (generalized) (M62.81);Repeated falls (R29.6);Low vision, both eyes (H54.2)   Activity Tolerance Patient tolerated treatment well   Patient Left in chair;with call bell/phone within reach;with chair  alarm set   Nurse Communication Mobility status;Other (comment)        Time: 5361-4431 OT Time Calculation (min): 26 min  Charges: OT General Charges $OT Visit: 1 Visit OT Treatments $Therapeutic Activity: 8-22 mins $Neuromuscular Re-education: 8-22 mins  Shamon Cothran H. OTR/L Supplemental OT, Department of rehab services [phone]   Trentan Trippe R H. 03/09/2021, 1:18 PM

## 2021-03-10 LAB — GLUCOSE, CAPILLARY
Glucose-Capillary: 117 mg/dL — ABNORMAL HIGH (ref 70–99)
Glucose-Capillary: 182 mg/dL — ABNORMAL HIGH (ref 70–99)
Glucose-Capillary: 119 mg/dL — ABNORMAL HIGH (ref 70–99)
Glucose-Capillary: 216 mg/dL — ABNORMAL HIGH (ref 70–99)

## 2021-03-10 LAB — CBC
HCT: 42.2 % (ref 36.0–46.0)
Hemoglobin: 14.3 g/dL (ref 12.0–15.0)
MCH: 32.2 pg (ref 26.0–34.0)
MCHC: 33.9 g/dL (ref 30.0–36.0)
MCV: 95 fL (ref 80.0–100.0)
Platelets: 283 10*3/uL (ref 150–400)
RBC: 4.44 MIL/uL (ref 3.87–5.11)
RDW: 13 % (ref 11.5–15.5)
WBC: 9 10*3/uL (ref 4.0–10.5)
nRBC: 0 % (ref 0.0–0.2)

## 2021-03-10 MED ORDER — LOPERAMIDE HCL 2 MG PO CAPS: 2.0000 mg | ORAL_CAPSULE | ORAL | Status: DC | PRN

## 2021-03-10 NOTE — Plan of Care (Signed)
  Problem: Education: Goal: Knowledge of General Education information will improve Description: Including pain rating scale, medication(s)/side effects and non-pharmacologic comfort measures Outcome: Progressing   Problem: Health Behavior/Discharge Planning: Goal: Ability to manage health-related needs will improve Outcome: Progressing   Problem: Education: Goal: Knowledge of disease or condition will improve Outcome: Progressing Goal: Knowledge of patient specific risk factors addressed and post discharge goals established will improve Outcome: Progressing

## 2021-03-10 NOTE — Progress Notes (Signed)
PROGRESS NOTE  Jade [name] BWI:[id number] DOB: [birth year]   PCP: System, Provider Not In  Patient is from: Home.  Lives with husband.  Uses walker and cane at baseline.  DOA: 03/05/2021 LOS: 4  Chief complaints:  Chief Complaint  Patient presents with   Hypertension   Blurred Vision     Brief Narrative / Interim history: 65 year old F with PMH of CVA, left ICA stenosis, MS, diet-controlled DM-2, HTN,?COPD and HLD not on statin presenting with binocular blurry vision, and found to have markedly elevated BP with systolic to 260 and an acute left thalamic, anterior left frontal and possible right frontal and multiple remote lacunar CVA on MRI.  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  MRA neck was not performed due to patient's habitus.  CTA head and neck with 75% left proximal ICA stenosis.  TTE with LVEF of 55 to 60% and G1 DD.  A1c 6.4%.  LDL 184.  Neurology consulted and gave recommendation.  Therapy recommended CIR but CIR recommending SNF.  Waiting on SNF bed.  Subjective: Seen and examined earlier this morning.  No major events overnight of this morning.  Still with binocular blurry vision with no significant change.  No other complaints.  Objective: Vitals:   03/10/21 0449 03/10/21 0550 03/10/21 0909 03/10/21 1158  BP: (!) 187/70 (!) 158/69 (!) 181/78   Pulse: 66 (!) 55    Resp:      Temp:   98.4 F (36.9 C) 98.7 F (37.1 C)  TempSrc:   Oral Oral  SpO2:   99%   Weight:      Height:        Intake/Output Summary (Last 24 hours) at 03/10/2021 1403 Last data filed at 03/10/2021 0300 Gross per 24 hour  Intake --  Output 400 ml  Net -400 ml    Filed Weights   03/05/21 1744  Weight: 77.6 kg    Examination:  GENERAL: No apparent distress.  Nontoxic. HEENT: MMM.  Vision and hearing grossly intact.  NECK: Supple.  No apparent JVD.  RESP: On RA.  No IWOB.  Fair aeration bilaterally. CVS:  RRR. Heart sounds normal.  ABD/GI/GU: BS+. Abd soft, NTND.   MSK/EXT:  Moves extremities. No apparent deformity. No edema.  SKIN: no apparent skin lesion or wound NEURO: Awake and alert. Oriented appropriately.  No apparent focal neurodeficit other than vertical gaze palsy and possible nystagmus and blurry vision. PSYCH: Calm. Normal affect.   Procedures:  None  Microbiology summarized: COVID-19 and influenza PCR nonreactive.  Assessment & Plan: Acute CVA-presented with acute binocular blurry vision.  Seems to have vertical and horizontal gaze palsy on exam.  Markedly elevated BP with systolic to 260 on presentation.  MRI brain with acute left thalamic, anterior left frontal and possible right frontal and multiple remote lacunar infarcts.  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  CTA head and neck with 75% left proximal ICA stenosis.  TTE with normal LVEF and G1 DD. A1c 6.4%.  LDL 184. Of note, history of "multiple sclerosis" per recent PCP note when she presented with loss of strength, vision changes and freq falls, insomnia and urine and fecal incontinence.  She also has history of retinopathy getting intraocular injection at ophthalmology office.  Very skeptical about statin and medicine. -Appreciate neuro recs  -Plavix and aspirin for 3 weeks followed by Plavix alone.  Considered aspirin failure  -Continue high intensity statin  -Neurology to arrange outpatient follow-up locally -Normalizing  BP.  Continue amlodipine, HCTZ and as needed labetalol. -Continue PT/OT -Needs outpatient follow-up with her ophthalmologist.   Left ICA stenosis: CTA head and neck with 75% left proximal ICA stenosis.  It seems she was previously scheduled for CEA in 03/2018 but she canceled it.  Regardless, she is currently not symptomatic from this. -Outpatient follow-up with vascular surgery -Statin and antiplatelet as above.  Hypertensive emergency: SBP to 260 when checked by EMS.  On low-dose HCTZ at home.  BP improved. -Continue amlodipine and HCTZ -Continue  as needed labetalol-changed parameters.  Hyperlipidemia: LDL 184.  She does not believe in statin but no specific reason but taking while in-house. -Discussed the risk and benefits of statin and encourage her to continue even after discharge  CKD-3A:  Relatively stable. Recent Labs    03/05/21 1800 03/08/21 0045 03/09/21 0717 03/09/21 1645  BUN 21 20 16 21   CREATININE 1.10* 1.27* 1.13* 1.20*  -Continue monitoring  Prediabetes: A1c 6.4%. Recent Labs  Lab 03/09/21 1158 03/09/21 1557 03/09/21 2050 03/10/21 0747 03/10/21 1145  GLUCAP 116* 155* 118* 117* 182*  -Continue current insulin regimen -Continue statin  Chronic COPD: Reports history of this but not on medication.  No respiratory distress. -As needed albuterol  History of multiple sclerosis? Per PCP note, confirmed via MRI and CSF 15 years ago.  Ambulatory dysfunction: Uses walker and cane at baseline. -PT/OT eval  Hypokalemia: Resolved.  Class I obesity Body mass index is 32.31 kg/m.         DVT prophylaxis:  enoxaparin (LOVENOX) injection 40 mg Start: 03/05/21 2100  Code Status: Full code Family Communication: Patient and/or RN.  Patient says her husband does not speak 2101, and she will update him Level of care: Telemetry Medical Status is: Inpatient  Remains inpatient appropriate because:Unsafe d/c plan  Dispo: The patient is from: Home              Anticipated d/c is to: SNF              Patient currently is medically stable to d/c.   Difficult to place patient No             Consultants:  Neurology-signed off   Sch Meds:  Scheduled Meds:  amLODipine  10 mg Oral Daily   aspirin EC  81 mg Oral Daily   atorvastatin  80 mg Oral Daily   cholecalciferol  5,000 Units Oral Daily   clopidogrel  75 mg Oral Daily   enoxaparin (LOVENOX) injection  40 mg Subcutaneous Q24H   hydrochlorothiazide  12.5 mg Oral Daily   insulin aspart  0-5 Units Subcutaneous QHS   insulin aspart  0-9 Units  Subcutaneous TID WC   omega-3 acid ethyl esters  1,000 mg Oral Daily   Continuous Infusions: PRN Meds:.acetaminophen **OR** [DISCONTINUED] acetaminophen (TYLENOL) oral liquid 160 mg/5 mL **OR** [DISCONTINUED] acetaminophen, labetalol  Antimicrobials: Anti-infectives (From admission, onward)    None        I have personally reviewed the following labs and images: CBC: Recent Labs  Lab 03/05/21 1800 03/08/21 0045 03/10/21 0426  WBC 8.0 8.1 9.0  NEUTROABS 5.5  --   --   HGB 15.6* 13.2 14.3  HCT 47.3* 39.7 42.2  MCV 96.3 95.9 95.0  PLT 328 291 283   BMP &GFR Recent Labs  Lab 03/05/21 1800 03/08/21 0045 03/09/21 0717 03/09/21 1645  NA 138 140 142 141  K 3.8 3.2* 3.8 3.7  CL 104 106 111 107  CO2  25 28 23 25   GLUCOSE 147* 128* 117* 134*  BUN 21 20 16 21   CREATININE 1.10* 1.27* 1.13* 1.20*  CALCIUM 9.7 9.4 9.4 9.5  MG  --  2.0 2.0 1.8  PHOS  --  3.2 4.0 3.3   Estimated Creatinine Clearance: 43.5 mL/min (A) (by C-G formula based on SCr of 1.2 mg/dL (H)). Liver & Pancreas: Recent Labs  Lab 03/05/21 1800 03/08/21 0045 03/09/21 0717 03/09/21 1645  AST 23  --   --   --   ALT 13  --   --   --   ALKPHOS 77  --   --   --   BILITOT 1.1  --   --   --   PROT 7.1  --   --   --   ALBUMIN 4.1 3.3* 3.5 3.6   No results for input(s): LIPASE, AMYLASE in the last 168 hours. No results for input(s): AMMONIA in the last 168 hours. Diabetic: No results for input(s): HGBA1C in the last 72 hours.  Recent Labs  Lab 03/09/21 1158 03/09/21 1557 03/09/21 2050 03/10/21 0747 03/10/21 1145  GLUCAP 116* 155* 118* 117* 182*   Cardiac Enzymes: No results for input(s): CKTOTAL, CKMB, CKMBINDEX, TROPONINI in the last 168 hours. No results for input(s): PROBNP in the last 8760 hours. Coagulation Profile: No results for input(s): INR, PROTIME in the last 168 hours. Thyroid Function Tests: No results for input(s): TSH, T4TOTAL, FREET4, T3FREE, THYROIDAB in the last 72  hours. Lipid Profile: No results for input(s): CHOL, HDL, LDLCALC, TRIG, CHOLHDL, LDLDIRECT in the last 72 hours.  Anemia Panel: No results for input(s): VITAMINB12, FOLATE, FERRITIN, TIBC, IRON, RETICCTPCT in the last 72 hours. Urine analysis:    Component Value Date/Time   COLORURINE YELLOW 03/08/2015 0855   APPEARANCEUR CLEAR 03/08/2015 0855   LABSPEC 1.015 03/08/2015 0855   PHURINE 6.0 03/08/2015 0855   GLUCOSEU NEGATIVE 03/08/2015 0855   HGBUR NEGATIVE 03/08/2015 0855   BILIRUBINUR NEGATIVE 03/08/2015 0855   KETONESUR NEGATIVE 03/08/2015 0855   PROTEINUR 30 (A) 06/16/2010 2255   UROBILINOGEN 0.2 03/08/2015 0855   NITRITE NEGATIVE 03/08/2015 0855   LEUKOCYTESUR NEGATIVE 03/08/2015 0855   Sepsis Labs: Invalid input(s): PROCALCITONIN, LACTICIDVEN  Microbiology: Recent Results (from the past 240 hour(s))  Resp Panel by RT-PCR (Flu A&B, Covid) Nasopharyngeal Swab     Status: None   Collection Time: 03/05/21  8:37 PM   Specimen: Nasopharyngeal Swab; Nasopharyngeal(NP) swabs in vial transport medium  Result Value Ref Range Status   SARS Coronavirus 2 by RT PCR NEGATIVE NEGATIVE Final    Comment: (NOTE) SARS-CoV-2 target nucleic acids are NOT DETECTED.  The SARS-CoV-2 RNA is generally detectable in upper respiratory specimens during the acute phase of infection. The lowest concentration of SARS-CoV-2 viral copies this assay can detect is 138 copies/mL. A negative result does not preclude SARS-Cov-2 infection and should not be used as the sole basis for treatment or other patient management decisions. A negative result may occur with  improper specimen collection/handling, submission of specimen other than nasopharyngeal swab, presence of viral mutation(s) within the areas targeted by this assay, and inadequate number of viral copies(<138 copies/mL). A negative result must be combined with clinical observations, patient history, and epidemiological information. The expected  result is Negative.  Fact Sheet for Patients:  05/09/2015  Fact Sheet for Healthcare Providers:  05/06/21  This test is no t yet approved or cleared by the [employer] and  has been authorized for  detection and/or diagnosis of SARS-CoV-2 by FDA under an Emergency Use Authorization (EUA). This EUA will remain  in effect (meaning this test can be used) for the duration of the COVID-19 declaration under Section 564(b)(1) of the Act, 21 U.S.C.section 360bbb-3(b)(1), unless the authorization is terminated  or revoked sooner.       Influenza A by PCR NEGATIVE NEGATIVE Final   Influenza B by PCR NEGATIVE NEGATIVE Final    Comment: (NOTE) The Xpert Xpress SARS-CoV-2/FLU/RSV plus assay is intended as an aid in the diagnosis of influenza from Nasopharyngeal swab specimens and should not be used as a sole basis for treatment. Nasal washings and aspirates are unacceptable for Xpert Xpress SARS-CoV-2/FLU/RSV testing.  Fact Sheet for Patients: [employer]  Fact Sheet for Healthcare Providers: [employer]  This test is not yet approved or cleared by the [place] FDA and has been authorized for detection and/or diagnosis of SARS-CoV-2 by FDA under an Emergency Use Authorization (EUA). This EUA will remain in effect (meaning this test can be used) for the duration of the COVID-19 declaration under Section 564(b)(1) of the Act, 21 U.S.C. section 360bbb-3(b)(1), unless the authorization is terminated or revoked.  Performed at [facility], [address], [place], [place] [address]     Radiology Studies: No results found.    Darlisha Kelm T. Mervyn Pflaum [facility]  If 7PM-7AM, please contact night-coverage [email or web address] 03/10/2021, 2:03 PM

## 2021-03-10 NOTE — Plan of Care (Signed)
  Problem: Education: Goal: Knowledge of General Education information will improve Description Including pain rating scale, medication(s)/side effects and non-pharmacologic comfort measures Outcome: Progressing   Problem: Health Behavior/Discharge Planning: Goal: Ability to manage health-related needs will improve Outcome: Progressing   

## 2021-03-10 NOTE — TOC Progression Note (Addendum)
Transition of Care [facility]) - Progression Note    Patient Details  Name: DENYCE HARR MRN: [id number] Date of Birth: Aug 19, 1956  Transition of [facility]) CM/SW Contact  Beckie Busing, RN Phone Number:[phone]  03/10/2021, 3:08 PM  Clinical Narrative:    Patient has accepted bed offer from [facility]. CM spoke with Tresa Endo, bed will not be available until Monday. MD has been made aware.   Insurance auth approved Auth [id number] 03/10/21-03/14/21   Expected Discharge Plan: Skilled Nursing Facility Barriers to Discharge: Continued Medical Work up  Expected Discharge Plan and Services Expected Discharge Plan: Skilled Nursing Facility In-house Referral: NA Discharge Planning Services: CM Consult Post Acute Care Choice: Skilled Nursing Facility Living arrangements for the past 2 months: Single Family Home Expected Discharge Date: 03/10/21               DME Arranged: N/A DME Agency: NA       HH Arranged: NA HH Agency: NA         Social Determinants of Health (SDOH) Interventions    Readmission Risk Interventions No flowsheet data found.

## 2021-03-11 DIAGNOSIS — H499 Unspecified paralytic strabismus: Secondary | ICD-10-CM

## 2021-03-11 DIAGNOSIS — I10 Essential (primary) hypertension: Secondary | ICD-10-CM

## 2021-03-11 LAB — GLUCOSE, CAPILLARY
Glucose-Capillary: 118 mg/dL — ABNORMAL HIGH (ref 70–99)
Glucose-Capillary: 171 mg/dL — ABNORMAL HIGH (ref 70–99)
Glucose-Capillary: 141 mg/dL — ABNORMAL HIGH (ref 70–99)
Glucose-Capillary: 201 mg/dL — ABNORMAL HIGH (ref 70–99)

## 2021-03-11 NOTE — Progress Notes (Signed)
PROGRESS NOTE    Jade [name]  EGB:[id number] DOB: 01-16-1956 DOA: 03/05/2021 PCP: System, Provider Not In    Brief Narrative:  65 year old F with PMH of CVA, left ICA stenosis, MS, diet-controlled DM-2, HTN,?COPD and HLD not on statin presenting with binocular blurry vision, and found to have markedly elevated BP with systolic to 260 and an acute left thalamic, anterior left frontal and possible right frontal and multiple remote lacunar CVA on MRI.  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  MRA neck was not performed due to patient's habitus.  CTA head and neck with 75% left proximal ICA stenosis.  TTE with LVEF of 55 to 60% and G1 DD.  A1c 6.4%.  LDL 184.  Neurology consulted and gave recommendation.  Therapy recommended CIR but CIR recommending SNF.  Waiting on SNF bed.   Assessment & Plan:   Principal Problem:   Stroke-like symptoms Active Problems:   Diabetes mellitus type 2 with neurological manifestations (HCC)   Pure hypercholesterolemia   Patient noncompliant with statin medication   Essential hypertension, benign   Acute CVA (cerebrovascular accident) (HCC)  Acute CVA-presented with acute binocular blurry vision.  Seems to have vertical and horizontal gaze palsy on exam.  Markedly elevated BP with systolic to 260 on presentation.  MRI brain with acute left thalamic, anterior left frontal and possible right frontal and multiple remote lacunar infarcts.  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  CTA head and neck with 75% left proximal ICA stenosis.  TTE with normal LVEF and G1 DD. A1c 6.4%.  LDL 184. Of note, history of "multiple sclerosis" per recent PCP note when she presented with loss of strength, vision changes and freq falls, insomnia and urine and fecal incontinence.  She also has history of retinopathy getting intraocular injection at ophthalmology office.  Very skeptical about statin and medicine. -Neurology had been following             -Recommendations for  Plavix and aspirin for 3 weeks followed by Plavix alone.  Considered aspirin failure             -Continue high intensity statin             -Neurology to arrange outpatient follow-up locally -Normalizing BP.  Continue amlodipine, HCTZ and as needed labetalol. -Continue PT/OT as tolerated -Needs outpatient follow-up with her ophthalmologist.    Left ICA stenosis: CTA head and neck with 75% left proximal ICA stenosis.  It seems she was previously scheduled for CEA in 03/2018 but she canceled it.  Regardless, she is currently not symptomatic from this. -Outpatient follow-up with vascular surgery -Continue with statin and antiplatelet as above.   Hypertensive emergency: SBP to 260 when checked by EMS.  On low-dose HCTZ at home.  BP improved. -Continue amlodipine and HCTZ -Continue as needed labetalol-changed parameters.   Hyperlipidemia: LDL 184.  She does not believe in statin but no specific reason but taking while in-house. -Advised continued medical compliance   CKD-3A:  Relatively stable. -Continue monitoring   Prediabetes: A1c 6.4%. -Continue current insulin regimen -Continue statin as tolerated   Chronic COPD: Reports history of this but not on medication.  No respiratory distress. -continue PRN albuterol   History of multiple sclerosis? Per PCP note, confirmed via MRI and CSF 15 years ago. Ambulatory dysfunction: Uses walker and cane at baseline. -continued with PT   Hypokalemia: Resolved.   DVT prophylaxis: Lovenox subq Code Status: Full Family Communication: Pt in  room, family not at bedside  Status is: Inpatient  Remains inpatient appropriate because:Inpatient level of care appropriate due to severity of illness  Dispo: The patient is from: Home              Anticipated d/c is to: SNF              Patient currently is not medically stable to d/c.   Difficult to place patient No       Consultants:  Neurology  Procedures:     Antimicrobials: Anti-infectives (From admission, onward)    None       Subjective: Without complaints  Objective: Vitals:   03/10/21 2021 03/10/21 2043 03/11/21 0636 03/11/21 1207  BP: (!) 192/97 (!) 150/82 (!) 164/70 (!) 163/74  Pulse: 96  60 79  Resp: 20   (!) 21  Temp: 98.1 F (36.7 C)  98.2 F (36.8 C) 98.4 F (36.9 C)  TempSrc:   Oral Oral  SpO2: 98%   97%  Weight:      Height:        Intake/Output Summary (Last 24 hours) at 03/11/2021 1446 Last data filed at 03/10/2021 2000 Gross per 24 hour  Intake 50 ml  Output 475 ml  Net -425 ml   Filed Weights   03/05/21 1744  Weight: 77.6 kg    Examination: General exam: Awake, laying in bed, in nad Respiratory system: Normal respiratory effort, no wheezing Cardiovascular system: regular rate, s1, s2 Gastrointestinal system: Soft, nondistended, positive BS Central nervous system: CN2-12 grossly intact, strength intact Extremities: Perfused, no clubbing Skin: Normal skin turgor, no notable skin lesions seen Psychiatry: Mood normal // no visual hallucinations   Data Reviewed: I have personally reviewed following labs and imaging studies  CBC: Recent Labs  Lab 03/05/21 1800 03/08/21 0045 03/10/21 0426  WBC 8.0 8.1 9.0  NEUTROABS 5.5  --   --   HGB 15.6* 13.2 14.3  HCT 47.3* 39.7 42.2  MCV 96.3 95.9 95.0  PLT 328 291 283   Basic Metabolic Panel: Recent Labs  Lab 03/05/21 1800 03/08/21 0045 03/09/21 0717 03/09/21 1645  NA 138 140 142 141  K 3.8 3.2* 3.8 3.7  CL 104 106 111 107  CO2 25 28 23 25   GLUCOSE 147* 128* 117* 134*  BUN 21 20 16 21   CREATININE 1.10* 1.27* 1.13* 1.20*  CALCIUM 9.7 9.4 9.4 9.5  MG  --  2.0 2.0 1.8  PHOS  --  3.2 4.0 3.3   GFR: Estimated Creatinine Clearance: 43.5 mL/min (A) (by C-G formula based on SCr of 1.2 mg/dL (H)). Liver Function Tests: Recent Labs  Lab 03/05/21 1800 03/08/21 0045 03/09/21 0717 03/09/21 1645  AST 23  --   --   --   ALT 13  --   --   --    ALKPHOS 77  --   --   --   BILITOT 1.1  --   --   --   PROT 7.1  --   --   --   ALBUMIN 4.1 3.3* 3.5 3.6   No results for input(s): LIPASE, AMYLASE in the last 168 hours. No results for input(s): AMMONIA in the last 168 hours. Coagulation Profile: No results for input(s): INR, PROTIME in the last 168 hours. Cardiac Enzymes: No results for input(s): CKTOTAL, CKMB, CKMBINDEX, TROPONINI in the last 168 hours. BNP (last 3 results) No results for input(s): PROBNP in the last 8760 hours. HbA1C: No results for input(s): HGBA1C  in the last 72 hours. CBG: Recent Labs  Lab 03/10/21 1145 03/10/21 1549 03/10/21 2018 03/11/21 0723 03/11/21 1136  GLUCAP 182* 119* 216* 118* 171*   Lipid Profile: No results for input(s): CHOL, HDL, LDLCALC, TRIG, CHOLHDL, LDLDIRECT in the last 72 hours. Thyroid Function Tests: No results for input(s): TSH, T4TOTAL, FREET4, T3FREE, THYROIDAB in the last 72 hours. Anemia Panel: No results for input(s): VITAMINB12, FOLATE, FERRITIN, TIBC, IRON, RETICCTPCT in the last 72 hours. Sepsis Labs: No results for input(s): PROCALCITON, LATICACIDVEN in the last 168 hours.  Recent Results (from the past 240 hour(s))  Resp Panel by RT-PCR (Flu A&B, Covid) Nasopharyngeal Swab     Status: None   Collection Time: 03/05/21  8:37 PM   Specimen: Nasopharyngeal Swab; Nasopharyngeal(NP) swabs in vial transport medium  Result Value Ref Range Status   SARS Coronavirus 2 by RT PCR NEGATIVE NEGATIVE Final    Comment: (NOTE) SARS-CoV-2 target nucleic acids are NOT DETECTED.  The SARS-CoV-2 RNA is generally detectable in upper respiratory specimens during the acute phase of infection. The lowest concentration of SARS-CoV-2 viral copies this assay can detect is 138 copies/mL. A negative result does not preclude SARS-Cov-2 infection and should not be used as the sole basis for treatment or other patient management decisions. A negative result may occur with  improper specimen  collection/handling, submission of specimen other than nasopharyngeal swab, presence of viral mutation(s) within the areas targeted by this assay, and inadequate number of viral copies(<138 copies/mL). A negative result must be combined with clinical observations, patient history, and epidemiological information. The expected result is Negative.  Fact Sheet for Patients:  BloggerCourse.com  Fact Sheet for Healthcare Providers:  SeriousBroker.it  This test is no t yet approved or cleared by the [place] FDA and  has been authorized for detection and/or diagnosis of SARS-CoV-2 by FDA under an Emergency Use Authorization (EUA). This EUA will remain  in effect (meaning this test can be used) for the duration of the COVID-19 declaration under Section 564(b)(1) of the Act, 21 U.S.C.section 360bbb-3(b)(1), unless the authorization is terminated  or revoked sooner.       Influenza A by PCR NEGATIVE NEGATIVE Final   Influenza B by PCR NEGATIVE NEGATIVE Final    Comment: (NOTE) The Xpert Xpress SARS-CoV-2/FLU/RSV plus assay is intended as an aid in the diagnosis of influenza from Nasopharyngeal swab specimens and should not be used as a sole basis for treatment. Nasal washings and aspirates are unacceptable for Xpert Xpress SARS-CoV-2/FLU/RSV testing.  Fact Sheet for Patients: BloggerCourse.com  Fact Sheet for Healthcare Providers: SeriousBroker.it  This test is not yet approved or cleared by the [place] FDA and has been authorized for detection and/or diagnosis of SARS-CoV-2 by FDA under an Emergency Use Authorization (EUA). This EUA will remain in effect (meaning this test can be used) for the duration of the COVID-19 declaration under Section 564(b)(1) of the Act, 21 U.S.C. section 360bbb-3(b)(1), unless the authorization is terminated or revoked.  Performed at [facility], [address], [place], [place] [address]      Radiology Studies: No results found.  Scheduled Meds:  amLODipine  10 mg Oral Daily   aspirin EC  81 mg Oral Daily   atorvastatin  80 mg Oral Daily   cholecalciferol  5,000 Units Oral Daily   clopidogrel  75 mg Oral Daily   enoxaparin (LOVENOX) injection  40 mg Subcutaneous Q24H   hydrochlorothiazide  12.5 mg Oral Daily  insulin aspart  0-5 Units Subcutaneous QHS   insulin aspart  0-9 Units Subcutaneous TID WC   omega-3 acid ethyl esters  1,000 mg Oral Daily   Continuous Infusions:   LOS: 5 days   Rickey BarbaraStephen Maurianna Benard, MD [facility] Pager On Amion  If 7PM-7AM, please contact night-coverage 03/11/2021, 2:46 PM

## 2021-03-12 LAB — CBC
HCT: 43.2 % (ref 36.0–46.0)
Hemoglobin: 14.5 g/dL (ref 12.0–15.0)
MCH: 32.1 pg (ref 26.0–34.0)
MCHC: 33.6 g/dL (ref 30.0–36.0)
MCV: 95.6 fL (ref 80.0–100.0)
Platelets: 296 10*3/uL (ref 150–400)
RBC: 4.52 MIL/uL (ref 3.87–5.11)
RDW: 12.9 % (ref 11.5–15.5)
WBC: 9.3 10*3/uL (ref 4.0–10.5)
nRBC: 0 % (ref 0.0–0.2)

## 2021-03-12 LAB — GLUCOSE, CAPILLARY
Glucose-Capillary: 126 mg/dL — ABNORMAL HIGH (ref 70–99)
Glucose-Capillary: 118 mg/dL — ABNORMAL HIGH (ref 70–99)
Glucose-Capillary: 215 mg/dL — ABNORMAL HIGH (ref 70–99)
Glucose-Capillary: 138 mg/dL — ABNORMAL HIGH (ref 70–99)

## 2021-03-12 LAB — COMPREHENSIVE METABOLIC PANEL
ALT: 23 U/L (ref 0–44)
AST: 25 U/L (ref 15–41)
Albumin: 3.4 g/dL — ABNORMAL LOW (ref 3.5–5.0)
Alkaline Phosphatase: 65 U/L (ref 38–126)
Anion gap: 9 (ref 5–15)
BUN: 26 mg/dL — ABNORMAL HIGH (ref 8–23)
CO2: 24 mmol/L (ref 22–32)
Calcium: 9.6 mg/dL (ref 8.9–10.3)
Chloride: 104 mmol/L (ref 98–111)
Creatinine, Ser: 1.18 mg/dL — ABNORMAL HIGH (ref 0.44–1.00)
GFR, Estimated: 51 mL/min — ABNORMAL LOW (ref >60)
Glucose, Bld: 128 mg/dL — ABNORMAL HIGH (ref 70–99)
Potassium: 3.6 mmol/L (ref 3.5–5.1)
Sodium: 137 mmol/L (ref 135–145)
Total Bilirubin: 0.8 mg/dL (ref 0.3–1.2)
Total Protein: 6.5 g/dL (ref 6.5–8.1)

## 2021-03-12 LAB — SARS CORONAVIRUS 2 (TAT 6-24 HRS): SARS Coronavirus 2: NEGATIVE

## 2021-03-12 NOTE — Plan of Care (Signed)
  Problem: Education: Goal: Knowledge of General Education information will improve Description: Including pain rating scale, medication(s)/side effects and non-pharmacologic comfort measures Outcome: Progressing   Problem: Health Behavior/Discharge Planning: Goal: Ability to manage health-related needs will improve Outcome: Progressing   Problem: Education: Goal: Knowledge of disease or condition will improve Outcome: Progressing Goal: Knowledge of secondary prevention will improve Outcome: Progressing Goal: Knowledge of patient specific risk factors addressed and post discharge goals established will improve Outcome: Progressing Goal: Individualized Educational Video(s) Outcome: Progressing   

## 2021-03-12 NOTE — Plan of Care (Signed)
  Problem: Pain Managment: Goal: General experience of comfort will improve Outcome: Progressing   Problem: Safety: Goal: Ability to remain free from injury will improve Outcome: Progressing   Problem: Education: Goal: Knowledge of disease or condition will improve Outcome: Progressing

## 2021-03-12 NOTE — Progress Notes (Signed)
Physical Therapy Treatment Patient Details Name: QUINLAN MCFALL MRN: [id number] DOB: 24-Jun-1956 Today's Date: 03/12/2021    History of Present Illness Pt is a 65 y/o female admitted on 7/11 with binocular blurry vision. MRI revealed an acute L thalamic, anterior left frontal and possible right frontal and multiple remote lacunar infarcts PMH - DM, CVA, HTN and COPD.    PT Comments    Pt making steady progress overall towards achieving her current functional mobility. Per MD, plan is for pt to d/c to SNF tomorrow. Pt would continue to benefit from skilled physical therapy services at this time while admitted and after d/c to address the below listed limitations in order to improve overall safety and independence with functional mobility.    Follow Up Recommendations  SNF     Equipment Recommendations  None recommended by PT    Recommendations for Other Services       Precautions / Restrictions Precautions Precautions: Fall Precaution Comments: incontinent Restrictions Weight Bearing Restrictions: No    Mobility  Bed Mobility Overal bed mobility: Needs Assistance Bed Mobility: Supine to Sit     Supine to sit: Min guard     General bed mobility comments: increased time and effort needed, HOB elevated    Transfers Overall transfer level: Needs assistance Equipment used: Rolling walker (2 wheeled) Transfers: Sit to/from Stand Sit to Stand: Min guard;Min assist         General transfer comment: min guard to stand from EOB x2 and from toilet x1 with min A  Ambulation/Gait Ambulation/Gait assistance: Min assist Gait Distance (Feet): 25 Feet Assistive device: Rolling walker (2 wheeled) Gait Pattern/deviations: Step-through pattern;Decreased step length - right;Decreased dorsiflexion - right;Decreased weight shift to left;Shuffle;Narrow base of support Gait velocity: decreased   General Gait Details: pt with modest instability and difficulty with self-correcting  with LOB, min A required for balance and RW management   Stairs             Wheelchair Mobility    Modified Rankin (Stroke Patients Only) Modified Rankin (Stroke Patients Only) Pre-Morbid Rankin Score: Moderate disability Modified Rankin: Moderately severe disability     Balance Overall balance assessment: Needs assistance;History of Falls Sitting-balance support: Feet supported Sitting balance-Leahy Scale: Fair     Standing balance support: Single extremity supported;Bilateral upper extremity supported Standing balance-Leahy Scale: Poor                              Cognition Arousal/Alertness: Awake/alert Behavior During Therapy: WFL for tasks assessed/performed Overall Cognitive Status: Within Functional Limits for tasks assessed                                        Exercises      General Comments        Pertinent Vitals/Pain Pain Assessment: No/denies pain    Home Living                      Prior Function            PT Goals (current goals can now be found in the care plan section) Acute Rehab PT Goals PT Goal Formulation: With patient Time For Goal Achievement: 03/20/21 Potential to Achieve Goals: Good Progress towards PT goals: Progressing toward goals    Frequency    Min 3X/week  PT Plan Current plan remains appropriate    Co-evaluation              AM-PAC PT "6 Clicks" Mobility   Outcome Measure  Help needed turning from your back to your side while in a flat bed without using bedrails?: A Little Help needed moving from lying on your back to sitting on the side of a flat bed without using bedrails?: A Little Help needed moving to and from a bed to a chair (including a wheelchair)?: A Little Help needed standing up from a chair using your arms (e.g., wheelchair or bedside chair)?: A Little Help needed to walk in hospital room?: A Little Help needed climbing 3-5 steps with a railing?  : A Lot 6 Click Score: 17    End of Session Equipment Utilized During Treatment: Gait belt Activity Tolerance: Patient tolerated treatment well Patient left: in chair;with call bell/phone within reach;with chair alarm set Nurse Communication: Mobility status PT Visit Diagnosis: Other abnormalities of gait and mobility (R26.89);Hemiplegia and hemiparesis Hemiplegia - Right/Left: Right Hemiplegia - dominant/non-dominant: Dominant Hemiplegia - caused by: Cerebral infarction     Time: 1041-1104 PT Time Calculation (min) (ACUTE ONLY): 23 min  Charges:  $Gait Training: 8-22 mins $Therapeutic Activity: 8-22 mins                     Arletta Bale, DPT  Acute Rehabilitation Services Office [phone]    Alessandra Bevels Arrie Zuercher 03/12/2021, 11:12 AM

## 2021-03-12 NOTE — Progress Notes (Signed)
PROGRESS NOTE    Jade [name]  JOA:[id number] DOB: April 12, 1956 DOA: 03/05/2021 PCP: System, Provider Not In    Brief Narrative:  65 year old F with PMH of CVA, left ICA stenosis, MS, diet-controlled DM-2, HTN,?COPD and HLD not on statin presenting with binocular blurry vision, and found to have markedly elevated BP with systolic to 260 and an acute left thalamic, anterior left frontal and possible right frontal and multiple remote lacunar CVA on MRI.  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  MRA neck was not performed due to patient's habitus.  CTA head and neck with 75% left proximal ICA stenosis.  TTE with LVEF of 55 to 60% and G1 DD.  A1c 6.4%.  LDL 184.  Neurology consulted and gave recommendation.  Therapy recommended CIR but CIR recommending SNF.  Waiting on SNF bed.   Assessment & Plan:   Principal Problem:   Stroke-like symptoms Active Problems:   Diabetes mellitus type 2 with neurological manifestations (HCC)   Pure hypercholesterolemia   Patient noncompliant with statin medication   Essential hypertension, benign   Acute CVA (cerebrovascular accident) (HCC)  Acute CVA-presented with acute binocular blurry vision.  Seems to have vertical and horizontal gaze palsy on exam.  Markedly elevated BP with systolic to 260 on presentation.  MRI brain with acute left thalamic, anterior left frontal and possible right frontal and multiple remote lacunar infarcts.  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  CTA head and neck with 75% left proximal ICA stenosis.  TTE with normal LVEF and G1 DD. A1c 6.4%.  LDL 184. Of note, history of "multiple sclerosis" per recent PCP note when she presented with loss of strength, vision changes and freq falls, insomnia and urine and fecal incontinence.  She also has history of retinopathy getting intraocular injection at ophthalmology office.  Very skeptical about statin and medicine. -Neurology had been following             -Recommendations for  Plavix and aspirin for 3 weeks followed by Plavix alone.  Considered aspirin failure             -Continue high intensity statin             -Neurology to arrange outpatient follow-up locally -Normalizing BP.  Continue amlodipine, HCTZ and as needed labetalol. -Continue PT/OT as tolerated -Needs outpatient follow-up with her ophthalmologist.    Left ICA stenosis: CTA head and neck with 75% left proximal ICA stenosis.  It seems she was previously scheduled for CEA in 03/2018 but she canceled it.  Regardless, she is currently not symptomatic from this. -Recommend outpatient follow-up with vascular surgery -Continue with statin and antiplatelet as above.   Hypertensive emergency: SBP to 260 when checked by EMS.  On low-dose HCTZ at home.  BP improved. -Continue amlodipine and HCTZ -Continue as needed labetalol-changed parameters.   Hyperlipidemia: LDL 184.  She does not believe in statin but no specific reason but taking while in-house. -Advised continued medical compliance   CKD-3A:  Relatively stable. -Continue monitoring   Prediabetes: A1c 6.4%. -Continue current insulin regimen -Continue statin as tolerated   Chronic COPD: Reports history of this but not on medication.  No respiratory distress. -continue PRN albuterol   History of multiple sclerosis? Per PCP note, confirmed via MRI and CSF 15 years ago. Ambulatory dysfunction: Uses walker and cane at baseline. -continued with PT, plan for SNF   Hypokalemia: Resolved.   DVT prophylaxis: Lovenox subq Code Status: Full  Family Communication: Pt in room, family not at bedside  Status is: Inpatient  Remains inpatient appropriate because:Inpatient level of care appropriate due to severity of illness  Dispo: The patient is from: Home              Anticipated d/c is to: SNF              Patient currently is not medically stable to d/c.   Difficult to place patient No    Consultants:  Neurology  Procedures:     Antimicrobials: Anti-infectives (From admission, onward)    None       Subjective: Without complaints  Objective: Vitals:   03/12/21 0442 03/12/21 0705 03/12/21 0900 03/12/21 1439  BP: (!) 166/95  (!) 150/90 (!) 161/91  Pulse: 62  64 79  Resp: 16  18 18   Temp: 97.6 F (36.4 C)   97.9 F (36.6 C)  TempSrc:      SpO2: 98% 96% 100% 98%  Weight:      Height:        Intake/Output Summary (Last 24 hours) at 03/12/2021 1518 Last data filed at 03/12/2021 0400 Gross per 24 hour  Intake 200 ml  Output 1150 ml  Net -950 ml    Filed Weights   03/05/21 1744  Weight: 77.6 kg    Examination: General exam: Conversant, in no acute distress Respiratory system: normal chest rise, clear, no audible wheezing Cardiovascular system: regular rhythm, s1-s2 Gastrointestinal system: Nondistended, nontender, pos BS Central nervous system: No seizures, no tremors Extremities: No cyanosis, no joint deformities Skin: No rashes, no pallor Psychiatry: Affect normal // no auditory hallucinations   Data Reviewed: I have personally reviewed following labs and imaging studies  CBC: Recent Labs  Lab 03/05/21 1800 03/08/21 0045 03/10/21 0426 03/12/21 0320  WBC 8.0 8.1 9.0 9.3  NEUTROABS 5.5  --   --   --   HGB 15.6* 13.2 14.3 14.5  HCT 47.3* 39.7 42.2 43.2  MCV 96.3 95.9 95.0 95.6  PLT 328 291 283 296    Basic Metabolic Panel: Recent Labs  Lab 03/05/21 1800 03/08/21 0045 03/09/21 0717 03/09/21 1645 03/12/21 0320  NA 138 140 142 141 137  K 3.8 3.2* 3.8 3.7 3.6  CL 104 106 111 107 104  CO2 25 28 23 25 24   GLUCOSE 147* 128* 117* 134* 128*  BUN 21 20 16 21  26*  CREATININE 1.10* 1.27* 1.13* 1.20* 1.18*  CALCIUM 9.7 9.4 9.4 9.5 9.6  MG  --  2.0 2.0 1.8  --   PHOS  --  3.2 4.0 3.3  --     GFR: Estimated Creatinine Clearance: 44.2 mL/min (A) (by C-G formula based on SCr of 1.18 mg/dL (H)). Liver Function Tests: Recent Labs  Lab 03/05/21 1800 03/08/21 0045  03/09/21 0717 03/09/21 1645 03/12/21 0320  AST 23  --   --   --  25  ALT 13  --   --   --  23  ALKPHOS 77  --   --   --  65  BILITOT 1.1  --   --   --  0.8  PROT 7.1  --   --   --  6.5  ALBUMIN 4.1 3.3* 3.5 3.6 3.4*    No results for input(s): LIPASE, AMYLASE in the last 168 hours. No results for input(s): AMMONIA in the last 168 hours. Coagulation Profile: No results for input(s): INR, PROTIME in the last 168 hours. Cardiac Enzymes: No results  for input(s): CKTOTAL, CKMB, CKMBINDEX, TROPONINI in the last 168 hours. BNP (last 3 results) No results for input(s): PROBNP in the last 8760 hours. HbA1C: No results for input(s): HGBA1C in the last 72 hours. CBG: Recent Labs  Lab 03/11/21 1136 03/11/21 1521 03/11/21 2146 03/12/21 0739 03/12/21 1128  GLUCAP 171* 141* 201* 126* 118*    Lipid Profile: No results for input(s): CHOL, HDL, LDLCALC, TRIG, CHOLHDL, LDLDIRECT in the last 72 hours. Thyroid Function Tests: No results for input(s): TSH, T4TOTAL, FREET4, T3FREE, THYROIDAB in the last 72 hours. Anemia Panel: No results for input(s): VITAMINB12, FOLATE, FERRITIN, TIBC, IRON, RETICCTPCT in the last 72 hours. Sepsis Labs: No results for input(s): PROCALCITON, LATICACIDVEN in the last 168 hours.  Recent Results (from the past 240 hour(s))  Resp Panel by RT-PCR (Flu A&B, Covid) Nasopharyngeal Swab     Status: None   Collection Time: 03/05/21  8:37 PM   Specimen: Nasopharyngeal Swab; Nasopharyngeal(NP) swabs in vial transport medium  Result Value Ref Range Status   SARS Coronavirus 2 by RT PCR NEGATIVE NEGATIVE Final    Comment: (NOTE) SARS-CoV-2 target nucleic acids are NOT DETECTED.  The SARS-CoV-2 RNA is generally detectable in upper respiratory specimens during the acute phase of infection. The lowest concentration of SARS-CoV-2 viral copies this assay can detect is 138 copies/mL. A negative result does not preclude SARS-Cov-2 infection and should not be used as the  sole basis for treatment or other patient management decisions. A negative result may occur with  improper specimen collection/handling, submission of specimen other than nasopharyngeal swab, presence of viral mutation(s) within the areas targeted by this assay, and inadequate number of viral copies(<138 copies/mL). A negative result must be combined with clinical observations, patient history, and epidemiological information. The expected result is Negative.  Fact Sheet for Patients:  BloggerCourse.com  Fact Sheet for Healthcare Providers:  SeriousBroker.it  This test is no t yet approved or cleared by the [place] FDA and  has been authorized for detection and/or diagnosis of SARS-CoV-2 by FDA under an Emergency Use Authorization (EUA). This EUA will remain  in effect (meaning this test can be used) for the duration of the COVID-19 declaration under Section 564(b)(1) of the Act, 21 U.S.C.section 360bbb-3(b)(1), unless the authorization is terminated  or revoked sooner.       Influenza A by PCR NEGATIVE NEGATIVE Final   Influenza B by PCR NEGATIVE NEGATIVE Final    Comment: (NOTE) The Xpert Xpress SARS-CoV-2/FLU/RSV plus assay is intended as an aid in the diagnosis of influenza from Nasopharyngeal swab specimens and should not be used as a sole basis for treatment. Nasal washings and aspirates are unacceptable for Xpert Xpress SARS-CoV-2/FLU/RSV testing.  Fact Sheet for Patients: BloggerCourse.com  Fact Sheet for Healthcare Providers: SeriousBroker.it  This test is not yet approved or cleared by the [place] FDA and has been authorized for detection and/or diagnosis of SARS-CoV-2 by FDA under an Emergency Use Authorization (EUA). This EUA will remain in effect (meaning this test can be used) for the duration of the COVID-19 declaration under Section 564(b)(1) of the  Act, 21 U.S.C. section 360bbb-3(b)(1), unless the authorization is terminated or revoked.  Performed at [facility], [address], [place], [place] [address]       Radiology Studies: No results found.  Scheduled Meds:  amLODipine  10 mg Oral Daily   aspirin EC  81 mg Oral Daily   atorvastatin  80 mg Oral Daily   cholecalciferol  5,000 Units Oral Daily   clopidogrel  75 mg Oral Daily   enoxaparin (LOVENOX) injection  40 mg Subcutaneous Q24H   hydrochlorothiazide  12.5 mg Oral Daily   insulin aspart  0-5 Units Subcutaneous QHS   insulin aspart  0-9 Units Subcutaneous TID WC   omega-3 acid ethyl esters  1,000 mg Oral Daily   Continuous Infusions:   LOS: 6 days   Rickey Barbara, MD [facility] Pager On Amion  If 7PM-7AM, please contact night-coverage 03/12/2021, 3:18 PM

## 2021-03-13 LAB — GLUCOSE, CAPILLARY
Glucose-Capillary: 140 mg/dL — ABNORMAL HIGH (ref 70–99)
Glucose-Capillary: 148 mg/dL — ABNORMAL HIGH (ref 70–99)
Glucose-Capillary: 132 mg/dL — ABNORMAL HIGH (ref 70–99)
Glucose-Capillary: 169 mg/dL — ABNORMAL HIGH (ref 70–99)

## 2021-03-13 NOTE — TOC Progression Note (Addendum)
Transition of Care [facility]) - Progression Note    Patient Details  Name: Jade [name] MRN: [id number] Date of Birth: March 03, 1956  [facility]) CM/SW Contact  Bess Kinds, RN Phone Number: [phone] 03/13/2021, 5:53 PM  Clinical Narrative:     Received call from Tresa Endo at [facility] requesting patient's covid vaccine information. Spoke with patient - not vaccinated and will not be vaccinated. [facility] rescinded bed offer d/t requiring vaccination for all patients.   Provided medicare.Research scientist (medical) for other bed offers ([facility], [place], [facility]) - patient declined all other bed offers.   Patient stated that she will return home with her husband. Discussed home health agencies. Referral accepted by [facility] for PT, OT, SW. MD notified of needed orders for Home Health and Face to Face.   Discussed DME needs. Has RW and 3-N-1. Declines WC and hospital bed at this time. Asked about tub bench - encouraged to go to discount medical supply or [employer] for cost effectiveness.   Patient stated that she has Medicaid, but cannot see Medicaid info in chart. Discussed getting caregiver through Medicaid. HH SW to assist with this process.   Spouse to provide transportation home. Patient stated that he typically works until 4pm.   TOC following for transition needs.   Expected Discharge Plan: Skilled Nursing Facility Barriers to Discharge: Continued Medical Work up  Expected Discharge Plan and Services Expected Discharge Plan: Skilled Nursing Facility In-house Referral: NA Discharge Planning Services: CM Consult Post Acute Care Choice: Skilled Nursing Facility Living arrangements for the past 2 months: Single Family Home Expected Discharge Date: 03/10/21               DME Arranged: N/A DME Agency: NA       HH Arranged: NA HH Agency: NA         Social Determinants of Health (SDOH) Interventions    Readmission Risk Interventions No flowsheet data  found.

## 2021-03-13 NOTE — Care Management Important Message (Signed)
Important Message  Patient Details  Name: Jade [name] MRN: [id number] Date of Birth: 03-14-1956   Medicare Important Message Given:  Yes     Ladiamond Gallina P Chealsea Paske 03/13/2021, 1:32 PM

## 2021-03-13 NOTE — Plan of Care (Signed)

## 2021-03-13 NOTE — TOC Progression Note (Signed)
Transition of Care [facility]) - Progression Note    Patient Details  Name: Jade [name] MRN: [id number] Date of Birth: 1956-07-22  Transition of Care [facility]) CM/SW Contact  Bess Kinds, RN Phone Number: [phone] 03/13/2021, 11:50 AM  Clinical Narrative:     Received call back from [place] at [facility]. They do not have a bed today, but have a semi-private tomorrow. Spoke to patient about semi-private and unable to say when or if a private room would be available, patient accepted bed. Patient will need repeat covid swab today for discharge tomorrow. TOC following.  Expected Discharge Plan: Skilled Nursing Facility Barriers to Discharge: Continued Medical Work up  Expected Discharge Plan and Services Expected Discharge Plan: Skilled Nursing Facility In-house Referral: NA Discharge Planning Services: CM Consult Post Acute Care Choice: Skilled Nursing Facility Living arrangements for the past 2 months: Single Family Home Expected Discharge Date: 03/10/21               DME Arranged: N/A DME Agency: NA       HH Arranged: NA HH Agency: NA         Social Determinants of Health (SDOH) Interventions    Readmission Risk Interventions No flowsheet data found.

## 2021-03-13 NOTE — Progress Notes (Signed)
PROGRESS NOTE    Jade [name]  FUX:[id number] DOB: 1955/10/07 DOA: 03/05/2021 PCP: System, Provider Not In    Brief Narrative:  65 year old F with PMH of CVA, left ICA stenosis, MS, diet-controlled DM-2, HTN,?COPD and HLD not on statin presenting with binocular blurry vision, and found to have markedly elevated BP with systolic to 260 and an acute left thalamic, anterior left frontal and possible right frontal and multiple remote lacunar CVA on MRI.  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  MRA neck was not performed due to patient's habitus.  CTA head and neck with 75% left proximal ICA stenosis.  TTE with LVEF of 55 to 60% and G1 DD.  A1c 6.4%.  LDL 184.  Neurology consulted and gave recommendation.  Therapy recommended CIR but CIR recommending SNF.  Waiting on SNF bed.   Assessment & Plan:   Principal Problem:   Stroke-like symptoms Active Problems:   Diabetes mellitus type 2 with neurological manifestations (HCC)   Pure hypercholesterolemia   Patient noncompliant with statin medication   Essential hypertension, benign   Acute CVA (cerebrovascular accident) (HCC)  Acute CVA-presented with acute binocular blurry vision.  Seems to have vertical and horizontal gaze palsy on exam.  Markedly elevated BP with systolic to 260 on presentation.  MRI brain with acute left thalamic, anterior left frontal and possible right frontal and multiple remote lacunar infarcts.  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  CTA head and neck with 75% left proximal ICA stenosis.  TTE with normal LVEF and G1 DD. A1c 6.4%.  LDL 184. Of note, history of "multiple sclerosis" per recent PCP note when she presented with loss of strength, vision changes and freq falls, insomnia and urine and fecal incontinence.  She also has history of retinopathy getting intraocular injection at ophthalmology office.  Very skeptical about statin and medicine. -Neurology had been following             -Recommendations for  Plavix and aspirin for 3 weeks followed by Plavix alone.  Considered aspirin failure             -Continue high intensity statin             -Neurology to arrange outpatient follow-up locally -Normalizing BP.  Continue amlodipine, HCTZ and as needed labetalol. -Continue PT/OT as tolerated -Needs outpatient follow-up with her ophthalmologist.    Left ICA stenosis: CTA head and neck with 75% left proximal ICA stenosis.  It seems she was previously scheduled for CEA in 03/2018 but she canceled it.  Regardless, she is currently not symptomatic from this. -Recommend outpatient follow-up with vascular surgery -Continue with statin and antiplatelet as above.   Hypertensive emergency: SBP to 260 when checked by EMS.  On low-dose HCTZ at home.  BP improved. -Continue amlodipine and HCTZ -Pt is continued with PRN labetalol   Hyperlipidemia: LDL 184.  She does not believe in statin but no specific reason but taking while in-house. -Advised continued medical compliance   CKD-3A:  Relatively stable. -Continue monitoring   Prediabetes: A1c 6.4%. -Continue current insulin regimen -Continue statin as tolerated   Chronic COPD: Reports history of this but not on medication.  No respiratory distress. -continue PRN albuterol   History of multiple sclerosis? Per PCP note, confirmed via MRI and CSF 15 years ago. Ambulatory dysfunction: Uses walker and cane at baseline. -continued with PT, plan for SNF   Hypokalemia: Resolved.   DVT prophylaxis: Lovenox subq Code Status:  Full Family Communication: Pt in room, family not at bedside  Status is: Inpatient  Remains inpatient appropriate because:Inpatient level of care appropriate due to severity of illness  Dispo: The patient is from: Home              Anticipated d/c is to: SNF              Patient currently is not medically stable to d/c.   Difficult to place patient No    Consultants:  Neurology  Procedures:     Antimicrobials: Anti-infectives (From admission, onward)    None       Subjective: Without complaints today  Objective: Vitals:   03/13/21 0537 03/13/21 0705 03/13/21 0816 03/13/21 1010  BP: (!) 186/84  (!) 180/85 (!) 160/101  Pulse: 71  69 67  Resp: 20  18   Temp: 98.2 F (36.8 C)  98 F (36.7 C)   TempSrc:      SpO2: 95% 96% 96%   Weight:      Height:       No intake or output data in the 24 hours ending 03/13/21 1432  Filed Weights   03/05/21 1744  Weight: 77.6 kg    Examination: General exam: Awake, laying in bed, in nad Respiratory system: Normal respiratory effort, no wheezing Cardiovascular system: regular rate, s1, s2 Gastrointestinal system: Soft, nondistended, positive BS Central nervous system: CN2-12 grossly intact, strength intact Extremities: Perfused, no clubbing Skin: Normal skin turgor, no notable skin lesions seen Psychiatry: Mood normal // no visual hallucinations   Data Reviewed: I have personally reviewed following labs and imaging studies  CBC: Recent Labs  Lab 03/08/21 0045 03/10/21 0426 03/12/21 0320  WBC 8.1 9.0 9.3  HGB 13.2 14.3 14.5  HCT 39.7 42.2 43.2  MCV 95.9 95.0 95.6  PLT 291 283 296    Basic Metabolic Panel: Recent Labs  Lab 03/08/21 0045 03/09/21 0717 03/09/21 1645 03/12/21 0320  NA 140 142 141 137  K 3.2* 3.8 3.7 3.6  CL 106 111 107 104  CO2 28 23 25 24   GLUCOSE 128* 117* 134* 128*  BUN 20 16 21  26*  CREATININE 1.27* 1.13* 1.20* 1.18*  CALCIUM 9.4 9.4 9.5 9.6  MG 2.0 2.0 1.8  --   PHOS 3.2 4.0 3.3  --     GFR: Estimated Creatinine Clearance: 44.2 mL/min (A) (by C-G formula based on SCr of 1.18 mg/dL (H)). Liver Function Tests: Recent Labs  Lab 03/08/21 0045 03/09/21 0717 03/09/21 1645 03/12/21 0320  AST  --   --   --  25  ALT  --   --   --  23  ALKPHOS  --   --   --  65  BILITOT  --   --   --  0.8  PROT  --   --   --  6.5  ALBUMIN 3.3* 3.5 3.6 3.4*    No results for input(s): LIPASE,  AMYLASE in the last 168 hours. No results for input(s): AMMONIA in the last 168 hours. Coagulation Profile: No results for input(s): INR, PROTIME in the last 168 hours. Cardiac Enzymes: No results for input(s): CKTOTAL, CKMB, CKMBINDEX, TROPONINI in the last 168 hours. BNP (last 3 results) No results for input(s): PROBNP in the last 8760 hours. HbA1C: No results for input(s): HGBA1C in the last 72 hours. CBG: Recent Labs  Lab 03/12/21 1128 03/12/21 1615 03/12/21 2036 03/13/21 0815 03/13/21 1144  GLUCAP 118* 215* 138* 140* 148*  Lipid Profile: No results for input(s): CHOL, HDL, LDLCALC, TRIG, CHOLHDL, LDLDIRECT in the last 72 hours. Thyroid Function Tests: No results for input(s): TSH, T4TOTAL, FREET4, T3FREE, THYROIDAB in the last 72 hours. Anemia Panel: No results for input(s): VITAMINB12, FOLATE, FERRITIN, TIBC, IRON, RETICCTPCT in the last 72 hours. Sepsis Labs: No results for input(s): PROCALCITON, LATICACIDVEN in the last 168 hours.  Recent Results (from the past 240 hour(s))  Resp Panel by RT-PCR (Flu A&B, Covid) Nasopharyngeal Swab     Status: None   Collection Time: 03/05/21  8:37 PM   Specimen: Nasopharyngeal Swab; Nasopharyngeal(NP) swabs in vial transport medium  Result Value Ref Range Status   SARS Coronavirus 2 by RT PCR NEGATIVE NEGATIVE Final    Comment: (NOTE) SARS-CoV-2 target nucleic acids are NOT DETECTED.  The SARS-CoV-2 RNA is generally detectable in upper respiratory specimens during the acute phase of infection. The lowest concentration of SARS-CoV-2 viral copies this assay can detect is 138 copies/mL. A negative result does not preclude SARS-Cov-2 infection and should not be used as the sole basis for treatment or other patient management decisions. A negative result may occur with  improper specimen collection/handling, submission of specimen other than nasopharyngeal swab, presence of viral mutation(s) within the areas targeted by this  assay, and inadequate number of viral copies(<138 copies/mL). A negative result must be combined with clinical observations, patient history, and epidemiological information. The expected result is Negative.  Fact Sheet for Patients:  BloggerCourse.com  Fact Sheet for Healthcare Providers:  SeriousBroker.it  This test is no t yet approved or cleared by the [place] FDA and  has been authorized for detection and/or diagnosis of SARS-CoV-2 by FDA under an Emergency Use Authorization (EUA). This EUA will remain  in effect (meaning this test can be used) for the duration of the COVID-19 declaration under Section 564(b)(1) of the Act, 21 U.S.C.section 360bbb-3(b)(1), unless the authorization is terminated  or revoked sooner.       Influenza A by PCR NEGATIVE NEGATIVE Final   Influenza B by PCR NEGATIVE NEGATIVE Final    Comment: (NOTE) The Xpert Xpress SARS-CoV-2/FLU/RSV plus assay is intended as an aid in the diagnosis of influenza from Nasopharyngeal swab specimens and should not be used as a sole basis for treatment. Nasal washings and aspirates are unacceptable for Xpert Xpress SARS-CoV-2/FLU/RSV testing.  Fact Sheet for Patients: BloggerCourse.com  Fact Sheet for Healthcare Providers: SeriousBroker.it  This test is not yet approved or cleared by the [place] FDA and has been authorized for detection and/or diagnosis of SARS-CoV-2 by FDA under an Emergency Use Authorization (EUA). This EUA will remain in effect (meaning this test can be used) for the duration of the COVID-19 declaration under Section 564(b)(1) of the Act, 21 U.S.C. section 360bbb-3(b)(1), unless the authorization is terminated or revoked.  Performed at [facility], [address], [place], [place] [address]   SARS CORONAVIRUS 2 (TAT 6-24 HRS) Nasopharyngeal Nasopharyngeal Swab     Status:  None   Collection Time: 03/12/21 11:56 AM   Specimen: Nasopharyngeal Swab  Result Value Ref Range Status   SARS Coronavirus 2 NEGATIVE NEGATIVE Final    Comment: (NOTE) SARS-CoV-2 target nucleic acids are NOT DETECTED.  The SARS-CoV-2 RNA is generally detectable in upper and lower respiratory specimens during the acute phase of infection. Negative results do not preclude SARS-CoV-2 infection, do not rule out co-infections with other pathogens, and should not be used as the sole basis for treatment or other  patient management decisions. Negative results must be combined with clinical observations, patient history, and epidemiological information. The expected result is Negative.  Fact Sheet for Patients: HairSlick.no  Fact Sheet for Healthcare Providers: quierodirigir.com  This test is not yet approved or cleared by the [place] FDA and  has been authorized for detection and/or diagnosis of SARS-CoV-2 by FDA under an Emergency Use Authorization (EUA). This EUA will remain  in effect (meaning this test can be used) for the duration of the COVID-19 declaration under Se ction 564(b)(1) of the Act, 21 U.S.C. section 360bbb-3(b)(1), unless the authorization is terminated or revoked sooner.  Performed at [facility], [address] [place], [place] [address]       Radiology Studies: No results found.  Scheduled Meds:  amLODipine  10 mg Oral Daily   aspirin EC  81 mg Oral Daily   atorvastatin  80 mg Oral Daily   cholecalciferol  5,000 Units Oral Daily   clopidogrel  75 mg Oral Daily   enoxaparin (LOVENOX) injection  40 mg Subcutaneous Q24H   hydrochlorothiazide  12.5 mg Oral Daily   insulin aspart  0-5 Units Subcutaneous QHS   insulin aspart  0-9 Units Subcutaneous TID WC   omega-3 acid ethyl esters  1,000 mg Oral Daily   Continuous Infusions:   LOS: 7 days   Rickey Barbara, MD [facility] Pager  On Amion  If 7PM-7AM, please contact night-coverage 03/13/2021, 2:32 PM

## 2021-03-13 NOTE — Plan of Care (Signed)
  Problem: Pain Managment: Goal: General experience of comfort will improve Outcome: Progressing   Problem: Safety: Goal: Ability to remain free from injury will improve Outcome: Progressing   Problem: Skin Integrity: Goal: Risk for impaired skin integrity will decrease Outcome: Progressing   

## 2021-03-13 NOTE — TOC Progression Note (Signed)
Transition of Care [facility]) - Progression Note    Patient Details  Name: [name] MRN: [id number] Date of Birth: 15-Oct-1955  [facility] CM/SW Contact  Bess Kinds, RN Phone Number: [phone] 03/13/2021, 8:46 AM  Clinical Narrative:     Sherron Monday with Tresa Endo at [facility] to follow up with bed availability. Tresa Endo will f/u with CM to verify bed availability for today. Advised of negative covid swab done yesterday.   Expected Discharge Plan: Skilled Nursing Facility Barriers to Discharge: Continued Medical Work up  Expected Discharge Plan and Services Expected Discharge Plan: Skilled Nursing Facility In-house Referral: NA Discharge Planning Services: CM Consult Post Acute Care Choice: Skilled Nursing Facility Living arrangements for the past 2 months: Single Family Home Expected Discharge Date: 03/10/21               DME Arranged: N/A DME Agency: NA       HH Arranged: NA HH Agency: NA         Social Determinants of Health (SDOH) Interventions    Readmission Risk Interventions No flowsheet data found.

## 2021-03-14 ENCOUNTER — Other Ambulatory Visit (HOSPITAL_COMMUNITY): Payer: Self-pay

## 2021-03-14 LAB — GLUCOSE, CAPILLARY
Glucose-Capillary: 112 mg/dL — ABNORMAL HIGH (ref 70–99)
Glucose-Capillary: 191 mg/dL — ABNORMAL HIGH (ref 70–99)

## 2021-03-14 MED ORDER — ATORVASTATIN CALCIUM 80 MG PO TABS
80.0000 mg | ORAL_TABLET | Freq: Every day | ORAL | 0 refills | Status: DC
  Filled 2021-03-14: qty 30, 30d supply, fill #0

## 2021-03-14 MED ORDER — CLOPIDOGREL BISULFATE 75 MG PO TABS
75.0000 mg | ORAL_TABLET | Freq: Every day | ORAL | 0 refills | Status: DC
  Filled 2021-03-14: qty 30, 30d supply, fill #0

## 2021-03-14 MED ORDER — AMLODIPINE BESYLATE 10 MG PO TABS
10.0000 mg | ORAL_TABLET | Freq: Every day | ORAL | 0 refills | Status: DC
  Filled 2021-03-14: qty 30, 30d supply, fill #0

## 2021-03-14 MED ORDER — ASPIRIN 81 MG PO TBEC
81.0000 mg | DELAYED_RELEASE_TABLET | Freq: Every day | ORAL | 0 refills | Status: DC
  Filled 2021-03-14: qty 21, 21d supply, fill #0

## 2021-03-14 NOTE — Progress Notes (Signed)
Discharge instructions (including medications) discussed with and copy provided to patient/caregiver. Meds from [facility] and walker sent home with patient along with patient belongings.

## 2021-03-14 NOTE — Discharge Summary (Signed)
Physician Discharge Summary  Jade [name] MWN:[id number] DOB: 08/26/1956 DOA: 03/05/2021  PCP: System, Provider Not In  Admit date: 03/05/2021 Discharge date: 03/14/2021  Admitted From: Home Disposition:  Home  Recommendations for Outpatient Follow-up:  Follow up with PCP in 1-2 weeks Follow up with Neurology as scheduled Pt to f/u with Vascular Surgery  Home Health:PT, OT   Discharge Condition:Stable CODE STATUS:Full Diet recommendation: Diabetic, heart healthy   Brief/Interim Summary: 65 year old F with PMH of CVA, left ICA stenosis, MS, diet-controlled DM-2, HTN,?COPD and HLD not on statin presenting with binocular blurry vision, and found to have markedly elevated BP with systolic to 403 and an acute left thalamic, anterior left frontal and possible right frontal and multiple remote lacunar CVA on MRI.  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  MRA neck was not performed due to patient's habitus.  CTA head and neck with 75% left proximal ICA stenosis.  TTE with LVEF of 55 to 60% and G1 DD.  A1c 6.4%.  LDL 184.  Neurology consulted  Discharge Diagnoses:  Principal Problem:   Stroke-like symptoms Active Problems:   Diabetes mellitus type 2 with neurological manifestations ([place])   Pure hypercholesterolemia   Patient noncompliant with statin medication   Essential hypertension, benign   Acute CVA (cerebrovascular accident) ([place])  Acute CVA-presented with acute binocular blurry vision.  Seems to have vertical and horizontal gaze palsy on exam.  Markedly elevated BP with systolic to 474 on presentation.  MRI brain with acute left thalamic, anterior left frontal and possible right frontal and multiple remote lacunar infarcts.  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  CTA head and neck with 75% left proximal ICA stenosis.  TTE with normal LVEF and G1 DD. A1c 6.4%.  LDL 184. Of note, history of "multiple sclerosis" per recent PCP note when she presented with loss of  strength, vision changes and freq falls, insomnia and urine and fecal incontinence.  She also has history of retinopathy getting intraocular injection at ophthalmology office.  Very skeptical about statin and medicine. -Neurology had been following             -Recommendations for Plavix and aspirin for 3 weeks followed by Plavix alone.  Considered aspirin failure             -Continue high intensity statin             -Neurology to arrange outpatient follow-up locally -Normalizing BP.  Continue amlodipine, HCTZ and as needed labetalol. -Continue PT/OT as tolerated -Needs outpatient follow-up with her ophthalmologist.    Left ICA stenosis: CTA head and neck with 75% left proximal ICA stenosis.  It seems she was previously scheduled for CEA in 03/2018 but she canceled it.  Regardless, she is currently not symptomatic from this. -Patient to follow up with vascular surgery -Continue with statin and antiplatelet as above.   Hypertensive emergency: SBP to 260 when checked by EMS.  On low-dose HCTZ at home.  BP improved. -Continue amlodipine and HCTZ   Hyperlipidemia: LDL 184.  She does not believe in statin but no specific reason but taking while in-house. -Advised continued medical compliance   CKD-3A:  Relatively stable.  Prediabetes: A1c 6.4%. -Continue current insulin regimen -Continue statin as tolerated   Chronic COPD: Reports history of this but not on medication.  No respiratory distress. -continue PRN albuterol   History of multiple sclerosis? Per PCP note, confirmed via MRI and CSF 15 years ago. Ambulatory dysfunction:  Uses walker and cane at baseline. -Patient initially was recommended for SNF by therapy but ultimately chose to go [facility] with [facility]   Hypokalemia: Resolved.    Discharge Instructions  Discharge Instructions     Ambulatory referral to Neurology   Complete by: As directed    Follow up with Dr. Leonie Man at [facility] in 4-6 weeks. Too complicated for RN to follow. Thanks.       Allergies as of 03/14/2021       Reactions   Nickel Dermatitis, Rash        Medication List     STOP taking these medications    Aspirin 325 MG Caps Replaced by: aspirin 81 MG EC tablet       TAKE these medications    Accu-Chek FastClix Lancet Kit Test up to TID dx:E11.40   Accu-Chek Nano SmartView w/Device Kit Test up to TID dx:E11.40   Accu-Chek SmartView Control Liqd Test up to TID dx:E11.40   amLODipine 10 MG tablet Commonly known as: NORVASC Take 1 tablet (10 mg total) by mouth daily. Start taking on: March 15, 2021   aspirin 81 MG EC tablet Take 1 tablet (81 mg total) by mouth daily for 21 days. Swallow whole. Start taking on: March 15, 2021 Replaces: Aspirin 325 MG Caps   atorvastatin 80 MG tablet Commonly known as: LIPITOR Take 1 tablet (80 mg total) by mouth daily. Start taking on: March 15, 2021   b complex vitamins capsule Take 1 capsule by mouth daily.   clopidogrel 75 MG tablet Commonly known as: PLAVIX Take 1 tablet (75 mg total) by mouth daily. Start taking on: March 15, 2021   CoQ-10 100 MG Caps Take 100 mg by mouth daily.   Fish Oil 600 MG Caps Take 600 mg by mouth 3 (three) times daily.   glucose blood test strip Commonly known as: Accu-Chek SmartView Test up to TID dx:E11.40   hydrochlorothiazide 12.5 MG capsule Commonly known as: Microzide Take 1 capsule (12.5 mg total) by mouth daily.   magnesium gluconate 500 MG tablet Commonly known as: MAGONATE Take 500 mg by mouth daily.   PRESCRIPTION MEDICATION Place 1 Dose into the left eye every 30 (thirty) days. Patient not sure about name of prescription - for the fluid filled bubble on the retina of left eye   Vitamin D-3 125 MCG (5000 UT) Tabs Take 5,000 Units by mouth daily.   VITAMIN K2 PO Take 1 tablet by mouth daily.               Durable Medical Equipment  (From admission, onward)           Start     Ordered   03/13/21 1752  For home use only DME  Other see comment  Once       Comments: Compression hose  Question:  Length of Need  Answer:  6 Months   03/13/21 1752            Follow-up Information     Garvin Fila, MD. Schedule an appointment as soon as possible for a visit in 1 month(s).   Specialties: Neurology, Radiology Why: stroke clinic Contact information: [address] [place] [address] [place] Follow up.   Specialty: [place] Why: Agency is now known as [facility] - they will call to establish care on Wednesday Contact information: [place] [id number] [phone]  Follow up with your PCP Follow up in 2 week(s).   Why: Hospital follow up               Allergies  Allergen Reactions   Nickel Dermatitis and Rash    Consultations: Neurology  Procedures/Studies: CT ANGIO HEAD NECK W WO CM  Result Date: 03/06/2021 CLINICAL DATA:  Hypertension.  Blurred vision.  Question stroke. EXAM: CT ANGIOGRAPHY HEAD AND NECK TECHNIQUE: Multidetector CT imaging of the head and neck was performed using the standard protocol during bolus administration of intravenous contrast. Multiplanar CT image reconstructions and MIPs were obtained to evaluate the vascular anatomy. Carotid stenosis measurements (when applicable) are obtained utilizing NASCET criteria, using the distal internal carotid diameter as the denominator. CONTRAST:  14mL OMNIPAQUE IOHEXOL 350 MG/ML SOLN COMPARISON:  MRI earlier same day showing left thalamic infarction in small infarctions in the left cerebral hemisphere. FINDINGS: CT HEAD FINDINGS Brain: Generalized atrophy. No focal abnormality seen affecting the brainstem or cerebellum by CT. 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 There are extensive chronic small-vessel ischemic changes throughout the hemispheric white matter. Old small vessel  infarction left thalamus. No acute finding by CT. No hemorrhage, hydrocephalus or subdural collection. Vascular: There is atherosclerotic calcification of the major vessels at the base of the brain. Skull: Negative Sinuses: Clear Orbits: Normal Review of the MIP images confirms the above findings CTA NECK FINDINGS Aortic arch: Aortic atherosclerotic calcification. Branching pattern is normal without flow limiting origin stenosis. Right carotid system: Common carotid artery is widely patent to the bifurcation. There is heavily calcified plaque at the carotid bifurcation and ICA bulb. Minimal diameter is 4 mm. Compared to a more distal cervical ICA diameter of 4 mm, there is no stenosis. Left carotid system: Common carotid artery shows scattered plaque but is widely patent to the bifurcation. There is calcified plaque at the carotid bifurcation and ICA bulb. Minimal diameter of the proximal ICA is 1 mm. Compared to a more distal cervical ICA diameter of 4 mm, this indicates a 75% stenosis. Vertebral arteries: Both vertebral arteries show calcified plaque at the origin but without stenosis. Beyond that, both vertebral arteries are patent through the cervical region to the foramen magnum. Skeleton: Exaggerated cervical lordosis. Degenerative spondylosis at C3-4 with fusion. Other neck: No soft tissue mass or lymphadenopathy. Upper chest: Lung apices are clear. Review of the MIP images confirms the above findings CTA HEAD FINDINGS Anterior circulation: Both internal carotid arteries are patent through the skull base and siphon regions. No siphon stenosis. The anterior and middle cerebral vessels are patent without large or medium vessel occlusion. More distal branch vessels show atherosclerotic narrowing and irregularity. No correctable proximal stenosis. No aneurysm or vascular malformation. Posterior circulation: Both vertebral arteries are patent through the foramen magnum to the basilar. No basilar stenosis.  Posterior circulation branch vessels are patent. More distal PCA branches show atherosclerotic irregularity, most severe in the P1 P2 junction regions on both sides. Venous sinuses: Patent and normal. Anatomic variants: None significant. Review of the MIP images confirms the above findings IMPRESSION: No large vessel occlusion. Aortic atherosclerosis. Atherosclerotic disease at both carotid bifurcations. No measurable stenosis on the right. 75% stenosis of the proximal ICA on the left. Intracranial vessels show distal vessel atherosclerotic narrowing and irregularity. No correctable proximal stenosis. Electronically Signed   By: Nelson Chimes M.D.   On: 03/06/2021 18:02   CT Head Wo Contrast  Result Date: 03/05/2021  CLINICAL DATA:  Revision hypertension EXAM: CT HEAD WITHOUT CONTRAST TECHNIQUE: Contiguous axial images were obtained from the base of the skull through the vertex without intravenous contrast. COMPARISON:  CT and MRI 08/21/2018 FINDINGS: Brain: Interval increase in the moderate periventricular and subcortical chronic microvascular white matter ischemic disease. Similar advanced for age moderate parenchymal volume loss. No evidence of acute large vascular territory infarction, hemorrhage, hydrocephalus, extra-axial collection or mass lesion/mass effect. Vascular: No hyperdense vessel. Atherosclerotic calcifications of the internal carotid arteries at skull base. Skull: No acute abnormality. Sinuses/Orbits: The visualized portions of the paranasal sinuses and mastoid air cells are predominantly clear. Other: None IMPRESSION: 1. No acute intracranial abnormality. 2. Slight interval increase in the moderate burden of chronic microvascular white matter ischemic disease. 3. Similar advanced for age moderate parenchymal volume loss. Electronically Signed   By: Dahlia Bailiff MD   On: 03/05/2021 19:53   MR ANGIO HEAD WO CONTRAST  Result Date: 03/06/2021 CLINICAL DATA:  Initial evaluation for acute  stroke, blurry vision. EXAM: MRI HEAD WITHOUT CONTRAST MRA HEAD WITHOUT CONTRAST MRA NECK WITHOUT AND WITH CONTRAST TECHNIQUE: Multiplanar, multi-echo pulse sequences of the brain and surrounding structures were acquired without intravenous contrast. Angiographic images of the Circle of Willis were acquired using MRA technique without intravenous contrast. Angiographic images of the neck were acquired using MRA technique without and with intravenous contrast. Carotid stenosis measurements (when applicable) are obtained utilizing NASCET criteria, using the distal internal carotid diameter as the denominator. CONTRAST:  None. COMPARISON:  Prior CT from 03/05/2021. FINDINGS: MRI HEAD FINDINGS Brain: Examination moderately degraded by motion artifact. Diffuse prominence of the CSF containing spaces compatible generalized age-related cerebral atrophy. Patchy and confluent T2/FLAIR hyperintensity within the periventricular and deep white matter both cerebral hemispheres most consistent with chronic small vessel ischemic disease, moderately advanced in nature. Patchy involvement of the pons noted. Multiple scattered remote lacunar infarcts noted about the bilateral basal ganglia and thalami. 6 mm focus of restricted diffusion seen involving the inferior 0 medial left thalamus, consistent with an acute ischemic infarct (series 3, image 23). Few additional punctate acute ischemic infarcts noted slightly more superiorly within the left thalamus (series 3, images 27, 26). Small linear focus of restricted diffusion involving the anterior left frontal lobe measures approximately 8 mm (series 3, image 37), consistent with a small acute ischemic infarct. Additional somewhat vague diffusion abnormality involving the right frontal corona radiata likely reflects changes of subacute small vessel ischemia (series 3, image 33). No associated hemorrhage or mass effect about these infarcts. Gray-white matter differentiation otherwise  maintained. No encephalomalacia to suggest chronic cortical infarction elsewhere within the brain. No acute intracranial hemorrhage. Few scattered chronic micro hemorrhages noted about the deep gray nuclei, likely hypertensive in nature. Benign arachnoid cyst noted at the anterior right middle cranial fossa. No other mass lesion, mass effect, or midline shift. Mild ventricular prominence related to global parenchymal volume loss of hydrocephalus. No extra-axial fluid collection. Pituitary gland suprasellar region within normal limits. Midline structures intact. Vascular: Major intracranial vascular flow voids are maintained. Skull and upper cervical spine: Craniocervical junction within normal limits. Bone marrow signal intensity within normal limits. No scalp soft tissue abnormality. Sinuses/Orbits: Globes and orbital soft tissues within normal limits. Paranasal sinuses are largely clear. No significant mastoid effusion. 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 MRA HEAD FINDINGS Anterior circulation: Visualized distal cervical segments of the internal carotid arteries are patent with antegrade flow. Petrous, cavernous, and supraclinoid segments patent without stenosis  or other abnormality. A1 segments widely patent. Normal anterior communicating artery complex. Anterior cerebral arteries patent to their distal aspects without high-grade stenosis. No M1 stenosis or occlusion. Normal MCA bifurcations. Severe distal right M2 stenosis, inferior division (series 352, image 12). Distal MCA branches are otherwise well perfused, although demonstrate probable diffuse small vessel atheromatous irregularity. Posterior circulation: Both V4 segments patent to the vertebrobasilar junction without stenosis. Both PICA origins patent and normal. Basilar patent to its distal aspect without stenosis. Superior cerebral arteries grossly patent at their origins. Both PCAs primarily supplied via the basilar. Severe  distal right P2 stenosis (series 356, image 11). PCAs otherwise somewhat irregular but perfused to their distal aspects without high-grade stenosis. No intracranial aneurysm. Anatomic variants: None. MRA NECK FINDINGS Please note that the MRA portion of this exam was unable to be performed due to patient habitus, inability to fit within the coil. IMPRESSION: MRI HEAD: 1. Patchy small volume acute ischemic nonhemorrhagic left thalamic infarcts as above. 2. Additional 8 mm acute ischemic nonhemorrhagic subcortical infarct involving the anterior left frontal lobe. 3. Small focus of mild diffusion abnormality involving the right frontal corona radiata, likely reflecting evolving subacute small vessel ischemic change. 4. Underlying age-related cerebral atrophy with moderate chronic microvascular ischemic disease, with multiple remote lacunar infarcts involving the bilateral basal ganglia and thalami. MRA HEAD: 1. Negative intracranial MRA for large vessel occlusion. 2. Severe distal right M2 and right P2 stenoses. 3. Diffuse small vessel atheromatous irregularity throughout the intracranial circulation. MRA NECK: Please note that the MRA portion of this exam was unable to be performed due to patient habitus/inability to fit within the coil. Electronically Signed   By: Jeannine Boga M.D.   On: 03/06/2021 04:40   MR Angiogram Neck W or Wo Contrast  Result Date: 03/06/2021 CLINICAL DATA:  Initial evaluation for acute stroke, blurry vision. EXAM: MRI HEAD WITHOUT CONTRAST MRA HEAD WITHOUT CONTRAST MRA NECK WITHOUT AND WITH CONTRAST TECHNIQUE: Multiplanar, multi-echo pulse sequences of the brain and surrounding structures were acquired without intravenous contrast. Angiographic images of the Circle of Willis were acquired using MRA technique without intravenous contrast. Angiographic images of the neck were acquired using MRA technique without and with intravenous contrast. Carotid stenosis measurements (when  applicable) are obtained utilizing NASCET criteria, using the distal internal carotid diameter as the denominator. CONTRAST:  None. COMPARISON:  Prior CT from 03/05/2021. FINDINGS: MRI HEAD FINDINGS Brain: Examination moderately degraded by motion artifact. Diffuse prominence of the CSF containing spaces compatible generalized age-related cerebral atrophy. Patchy and confluent T2/FLAIR hyperintensity within the periventricular and deep white matter both cerebral hemispheres most consistent with chronic small vessel ischemic disease, moderately advanced in nature. Patchy involvement of the pons noted. Multiple scattered remote lacunar infarcts noted about the bilateral basal ganglia and thalami. 6 mm focus of restricted diffusion seen involving the inferior 0 medial left thalamus, consistent with an acute ischemic infarct (series 3, image 23). Few additional punctate acute ischemic infarcts noted slightly more superiorly within the left thalamus (series 3, images 27, 26). Small linear focus of restricted diffusion involving the anterior left frontal lobe measures approximately 8 mm (series 3, image 37), consistent with a small acute ischemic infarct. Additional somewhat vague diffusion abnormality involving the right frontal corona radiata likely reflects changes of subacute small vessel ischemia (series 3, image 33). No associated hemorrhage or mass effect about these infarcts. Gray-white matter differentiation otherwise maintained. No encephalomalacia to suggest chronic cortical infarction elsewhere within the brain. No acute intracranial hemorrhage.  Few scattered chronic micro hemorrhages noted about the deep gray nuclei, likely hypertensive in nature. Benign arachnoid cyst noted at the anterior right middle cranial fossa. No other mass lesion, mass effect, or midline shift. Mild ventricular prominence related to global parenchymal volume loss of hydrocephalus. No extra-axial fluid collection. Pituitary gland  suprasellar region within normal limits. Midline structures intact. Vascular: Major intracranial vascular flow voids are maintained. Skull and upper cervical spine: Craniocervical junction within normal limits. Bone marrow signal intensity within normal limits. No scalp soft tissue abnormality. Sinuses/Orbits: Globes and orbital soft tissues within normal limits. Paranasal sinuses are largely clear. No significant mastoid effusion. 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 MRA HEAD FINDINGS Anterior circulation: Visualized distal cervical segments of the internal carotid arteries are patent with antegrade flow. Petrous, cavernous, and supraclinoid segments patent without stenosis or other abnormality. A1 segments widely patent. Normal anterior communicating artery complex. Anterior cerebral arteries patent to their distal aspects without high-grade stenosis. No M1 stenosis or occlusion. Normal MCA bifurcations. Severe distal right M2 stenosis, inferior division (series 352, image 12). Distal MCA branches are otherwise well perfused, although demonstrate probable diffuse small vessel atheromatous irregularity. Posterior circulation: Both V4 segments patent to the vertebrobasilar junction without stenosis. Both PICA origins patent and normal. Basilar patent to its distal aspect without stenosis. Superior cerebral arteries grossly patent at their origins. Both PCAs primarily supplied via the basilar. Severe distal right P2 stenosis (series 356, image 11). PCAs otherwise somewhat irregular but perfused to their distal aspects without high-grade stenosis. No intracranial aneurysm. Anatomic variants: None. MRA NECK FINDINGS Please note that the MRA portion of this exam was unable to be performed due to patient habitus, inability to fit within the coil. IMPRESSION: MRI HEAD: 1. Patchy small volume acute ischemic nonhemorrhagic left thalamic infarcts as above. 2. Additional 8 mm acute ischemic nonhemorrhagic  subcortical infarct involving the anterior left frontal lobe. 3. Small focus of mild diffusion abnormality involving the right frontal corona radiata, likely reflecting evolving subacute small vessel ischemic change. 4. Underlying age-related cerebral atrophy with moderate chronic microvascular ischemic disease, with multiple remote lacunar infarcts involving the bilateral basal ganglia and thalami. MRA HEAD: 1. Negative intracranial MRA for large vessel occlusion. 2. Severe distal right M2 and right P2 stenoses. 3. Diffuse small vessel atheromatous irregularity throughout the intracranial circulation. MRA NECK: Please note that the MRA portion of this exam was unable to be performed due to patient habitus/inability to fit within the coil. Electronically Signed   By: Jeannine Boga M.D.   On: 03/06/2021 04:40   MR BRAIN WO CONTRAST  Result Date: 03/06/2021 CLINICAL DATA:  Initial evaluation for acute stroke, blurry vision. EXAM: MRI HEAD WITHOUT CONTRAST MRA HEAD WITHOUT CONTRAST MRA NECK WITHOUT AND WITH CONTRAST TECHNIQUE: Multiplanar, multi-echo pulse sequences of the brain and surrounding structures were acquired without intravenous contrast. Angiographic images of the Circle of Willis were acquired using MRA technique without intravenous contrast. Angiographic images of the neck were acquired using MRA technique without and with intravenous contrast. Carotid stenosis measurements (when applicable) are obtained utilizing NASCET criteria, using the distal internal carotid diameter as the denominator. CONTRAST:  None. COMPARISON:  Prior CT from 03/05/2021. FINDINGS: MRI HEAD FINDINGS Brain: Examination moderately degraded by motion artifact. Diffuse prominence of the CSF containing spaces compatible generalized age-related cerebral atrophy. Patchy and confluent T2/FLAIR hyperintensity within the periventricular and deep white matter both cerebral hemispheres most consistent with chronic small vessel  ischemic disease, moderately advanced in  nature. Patchy involvement of the pons noted. Multiple scattered remote lacunar infarcts noted about the bilateral basal ganglia and thalami. 6 mm focus of restricted diffusion seen involving the inferior 0 medial left thalamus, consistent with an acute ischemic infarct (series 3, image 23). Few additional punctate acute ischemic infarcts noted slightly more superiorly within the left thalamus (series 3, images 27, 26). Small linear focus of restricted diffusion involving the anterior left frontal lobe measures approximately 8 mm (series 3, image 37), consistent with a small acute ischemic infarct. Additional somewhat vague diffusion abnormality involving the right frontal corona radiata likely reflects changes of subacute small vessel ischemia (series 3, image 33). No associated hemorrhage or mass effect about these infarcts. Gray-white matter differentiation otherwise maintained. No encephalomalacia to suggest chronic cortical infarction elsewhere within the brain. No acute intracranial hemorrhage. Few scattered chronic micro hemorrhages noted about the deep gray nuclei, likely hypertensive in nature. Benign arachnoid cyst noted at the anterior right middle cranial fossa. No other mass lesion, mass effect, or midline shift. Mild ventricular prominence related to global parenchymal volume loss of hydrocephalus. No extra-axial fluid collection. Pituitary gland suprasellar region within normal limits. Midline structures intact. Vascular: Major intracranial vascular flow voids are maintained. Skull and upper cervical spine: Craniocervical junction within normal limits. Bone marrow signal intensity within normal limits. No scalp soft tissue abnormality. Sinuses/Orbits: Globes and orbital soft tissues within normal limits. Paranasal sinuses are largely clear. No significant mastoid effusion. 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 MRA HEAD FINDINGS Anterior  circulation: Visualized distal cervical segments of the internal carotid arteries are patent with antegrade flow. Petrous, cavernous, and supraclinoid segments patent without stenosis or other abnormality. A1 segments widely patent. Normal anterior communicating artery complex. Anterior cerebral arteries patent to their distal aspects without high-grade stenosis. No M1 stenosis or occlusion. Normal MCA bifurcations. Severe distal right M2 stenosis, inferior division (series 352, image 12). Distal MCA branches are otherwise well perfused, although demonstrate probable diffuse small vessel atheromatous irregularity. Posterior circulation: Both V4 segments patent to the vertebrobasilar junction without stenosis. Both PICA origins patent and normal. Basilar patent to its distal aspect without stenosis. Superior cerebral arteries grossly patent at their origins. Both PCAs primarily supplied via the basilar. Severe distal right P2 stenosis (series 356, image 11). PCAs otherwise somewhat irregular but perfused to their distal aspects without high-grade stenosis. No intracranial aneurysm. Anatomic variants: None. MRA NECK FINDINGS Please note that the MRA portion of this exam was unable to be performed due to patient habitus, inability to fit within the coil. IMPRESSION: MRI HEAD: 1. Patchy small volume acute ischemic nonhemorrhagic left thalamic infarcts as above. 2. Additional 8 mm acute ischemic nonhemorrhagic subcortical infarct involving the anterior left frontal lobe. 3. Small focus of mild diffusion abnormality involving the right frontal corona radiata, likely reflecting evolving subacute small vessel ischemic change. 4. Underlying age-related cerebral atrophy with moderate chronic microvascular ischemic disease, with multiple remote lacunar infarcts involving the bilateral basal ganglia and thalami. MRA HEAD: 1. Negative intracranial MRA for large vessel occlusion. 2. Severe distal right M2 and right P2 stenoses.  3. Diffuse small vessel atheromatous irregularity throughout the intracranial circulation. MRA NECK: Please note that the MRA portion of this exam was unable to be performed due to patient habitus/inability to fit within the coil. Electronically Signed   By: Jeannine Boga M.D.   On: 03/06/2021 04:40   DG Chest Portable 1 View  Result Date: 03/05/2021 CLINICAL DATA:  Hypertension EXAM: PORTABLE  CHEST 1 VIEW COMPARISON:  Radiograph 04/05/2015 FINDINGS: Some coarsened reticular changes are noted in the bilateral apices, right greater than left though may be accentuated by patient's exaggerated thoracic kyphotic curvature. Some additional bandlike opacity in the right mid lung and left lung base could reflect atelectasis or scarring. No focal consolidation. No convincing features of edema. No pneumothorax or layering pleural effusion. Calcified aorta. Cardiomediastinal contours are otherwise unremarkable for positioning and portable technique. Telemetry leads overlie the chest. IMPRESSION: 1. Increasing coarse reticular opacities noted towards the lung apices, possibly accentuated by positioning and technique though can reflect acute or chronic infection or inflammatory change. 2. More bandlike opacities in the mid lungs, likely atelectasis or scarring. 3.  Aortic Atherosclerosis (ICD10-I70.0). Electronically Signed   By: Lovena Le M.D.   On: 03/05/2021 20:29   ECHOCARDIOGRAM COMPLETE  Result Date: 03/06/2021    ECHOCARDIOGRAM REPORT   Patient Name:   TAUNA MACFARLANE Date of Exam: 03/06/2021 Medical Rec #:  [id number]         Height:       61.0 in Accession #:    [id number]        Weight:       171.0 lb Date of Birth:  01/15/1956         BSA:          1.767 m Patient Age:    65 years          BP:           204/94 mmHg Patient Gender: F                 HR:           63 bpm. Exam Location:  Inpatient Procedure: 2D Echo, Cardiac Doppler and Color Doppler Indications:    Stroke I63.9  History:         Patient has prior history of Echocardiogram examinations, most                 recent 03/22/2018. COPD and Stroke; Risk Factors:Hypertension,                 Diabetes, Dyslipidemia and Sleep Apnea. CKD.  Sonographer:    Jonelle Sidle Dance Referring Phys: [place]  1. Difficult study due to poor definition of the LV endocardial border. Recommend definity contrast for future studies. Left ventricular ejection fraction, by estimation, is 55 to 60%. The left ventricle has normal function. Left ventricular endocardial  border not optimally defined to evaluate regional wall motion, but appears grossly normal. There is mild concentric left ventricular hypertrophy. Left ventricular diastolic parameters are consistent with Grade I diastolic dysfunction (impaired relaxation).  2. Right ventricular systolic function is normal. The right ventricular size is normal. Tricuspid regurgitation signal is inadequate for assessing PA pressure.  3. The mitral valve is normal in structure. No evidence of mitral valve regurgitation. No evidence of mitral stenosis.  4. The aortic valve is tricuspid. Aortic valve regurgitation is not visualized. No aortic stenosis is present.  5. The inferior vena cava is normal in size with greater than 50% respiratory variability, suggesting right atrial pressure of 3 mmHg. Comparison(s): No significant change from prior study. Conclusion(s)/Recommendation(s): No intracardiac source of embolism detected on this transthoracic study. A transesophageal echocardiogram is recommended to exclude cardiac source of embolism if clinically indicated. FINDINGS  Left Ventricle: Difficult study due to poor definition of the LV endocardial border. Recommend definity contrast for future studies. Left  ventricular ejection fraction, by estimation, is 55 to 60%. The left ventricle has normal function. Left ventricular endocardial border not optimally defined to evaluate regional wall motion. The left  ventricular internal cavity size was normal in size. There is mild concentric left ventricular hypertrophy. Left ventricular diastolic parameters are consistent with Grade I diastolic dysfunction (impaired relaxation). Right Ventricle: The right ventricular size is normal. Right vetricular wall thickness was not well visualized. Right ventricular systolic function is normal. Tricuspid regurgitation signal is inadequate for assessing PA pressure. Left Atrium: Left atrial size was normal in size. Right Atrium: Right atrial size was normal in size. Pericardium: There is no evidence of pericardial effusion. Mitral Valve: The mitral valve is normal in structure. There is mild thickening of the mitral valve leaflet(s). There is mild calcification of the mitral valve leaflet(s). Mild mitral annular calcification. No evidence of mitral valve regurgitation. No evidence of mitral valve stenosis. Tricuspid Valve: The tricuspid valve is normal in structure. Tricuspid valve regurgitation is trivial. Aortic Valve: The aortic valve is tricuspid. Aortic valve regurgitation is not visualized. No aortic stenosis is present. Pulmonic Valve: The pulmonic valve was not well visualized. Pulmonic valve regurgitation is trivial. Aorta: The aortic root and ascending aorta are structurally normal, with no evidence of dilitation. Venous: The inferior vena cava is normal in size with greater than 50% respiratory variability, suggesting right atrial pressure of 3 mmHg. IAS/Shunts: No atrial level shunt detected by color flow Doppler.  LEFT VENTRICLE PLAX 2D LVIDd:         3.80 cm  Diastology LVIDs:         2.80 cm  LV e' medial:    4.35 cm/s LV PW:         1.10 cm  LV E/e' medial:  12.3 LV IVS:        1.10 cm  LV e' lateral:   4.24 cm/s LVOT diam:     1.90 cm  LV E/e' lateral: 12.6 LV SV:         43 LV SV Index:   24 LVOT Area:     2.84 cm  RIGHT VENTRICLE            IVC RV Basal diam:  1.50 cm    IVC diam: 1.40 cm RV S prime:     9.90 cm/s  TAPSE (M-mode): 1.2 cm LEFT ATRIUM             Index       RIGHT ATRIUM          Index LA diam:        3.70 cm 2.09 cm/m  RA Area:     7.76 cm LA Vol (A2C):   27.7 ml 15.68 ml/m RA Volume:   11.30 ml 6.39 ml/m LA Vol (A4C):   15.2 ml 8.60 ml/m LA Biplane Vol: 22.1 ml 12.51 ml/m  AORTIC VALVE LVOT Vmax:   78.30 cm/s LVOT Vmean:  53.100 cm/s LVOT VTI:    0.150 m  AORTA Ao Root diam: 3.00 cm Ao Asc diam:  3.30 cm MITRAL VALVE MV Area (PHT): 2.01 cm    SHUNTS MV Decel Time: 377 msec    Systemic VTI:  0.15 m MV E velocity: 53.30 cm/s  Systemic Diam: 1.90 cm MV A velocity: 99.80 cm/s MV E/A ratio:  0.53 Gwyndolyn Kaufman MD Electronically signed by Gwyndolyn Kaufman MD Signature Date/Time: 03/06/2021/1:58:54 PM    Final    VAS US CAROTID  Result Date: 02/20/2021 Carotid  Arterial Duplex Study Patient Name:  ALIXANDRIA FRIEDT  Date of Exam:   02/20/2021 Medical Rec #: [id number]          Accession #:    [id number] Date of Birth: 05-Jun-1956          Patient Gender: F Patient Age:   65Y Exam Location:  [facility] Procedure:      VAS US CAROTID Referring Phys: [id number] Tamala Bari --------------------------------------------------------------------------------  Risk Factors:      Hypertension, hyperlipidemia, Diabetes, prior CVA. Other Factors:     Had CTA 03/24/2018 that indicated <50% right ICA stenosis and                    80% left ICA stenosis. Comparison Study:  No prior study Performing Technologist: Sharion Dove RVS  Examination Guidelines: A complete evaluation includes B-mode imaging, spectral Doppler, color Doppler, and power Doppler as needed of all accessible portions of each vessel. Bilateral testing is considered an integral part of a complete examination. Limited examinations for reoccurring indications may be performed as noted.  Right Carotid Findings: +----------+--------+--------+--------+------------------+--------+           PSV cm/sEDV cm/sStenosisPlaque DescriptionComments  +----------+--------+--------+--------+------------------+--------+ CCA Prox  82      20                                         +----------+--------+--------+--------+------------------+--------+ CCA Distal62      17                                         +----------+--------+--------+--------+------------------+--------+ ICA Prox  64      21      1-39%   calcific                   +----------+--------+--------+--------+------------------+--------+ ICA Distal61      15                                         +----------+--------+--------+--------+------------------+--------+ ECA       86      4                                          +----------+--------+--------+--------+------------------+--------+ +----------+--------+-------+--------+-------------------+           PSV cm/sEDV cmsDescribeArm Pressure (mmHG) +----------+--------+-------+--------+-------------------+ [id number]                                         +----------+--------+-------+--------+-------------------+ +---------+--------+--+--------+--+ VertebralPSV cm/s60EDV cm/s10 +---------+--------+--+--------+--+  Left Carotid Findings: +----------+--------+--------+--------+------------------+--------+           PSV cm/sEDV cm/sStenosisPlaque DescriptionComments +----------+--------+--------+--------+------------------+--------+ CCA Prox  65      17                                         +----------+--------+--------+--------+------------------+--------+ CCA Distal38      10                                         +----------+--------+--------+--------+------------------+--------+  ICA Prox  76      17      1-39%   heterogenous               +----------+--------+--------+--------+------------------+--------+ ICA Mid   60      14                                         +----------+--------+--------+--------+------------------+--------+ ICA Distal37      12                                          +----------+--------+--------+--------+------------------+--------+ ECA       78      11                                         +----------+--------+--------+--------+------------------+--------+ +----------+--------+--------+--------+-------------------+           PSV cm/sEDV cm/sDescribeArm Pressure (mmHG) +----------+--------+--------+--------+-------------------+ Subclavian185                                         +----------+--------+--------+--------+-------------------+ +---------+--------+--+--------+--+ VertebralPSV cm/s68EDV cm/s20 +---------+--------+--+--------+--+   Summary: Right Carotid: Velocities in the right ICA are consistent with a 1-39% stenosis. Left Carotid: Velocities in the left ICA are consistent with a 1-39% stenosis. Vertebrals:  Bilateral vertebral arteries demonstrate antegrade flow. Subclavians: Normal flow hemodynamics were seen in bilateral subclavian              arteries. *See table(s) above for measurements and observations.  Electronically signed by Antony Contras MD on 02/20/2021 at 5:33:00 PM.    Final     Subjective: Eager to go home  Discharge Exam: Vitals:   03/14/21 0900 03/14/21 1042  BP:  (!) 163/92  Pulse:  83  Resp: 18 20  Temp:  98.3 F (36.8 C)  SpO2:  99%   Vitals:   03/13/21 2049 03/13/21 2201 03/14/21 0900 03/14/21 1042  BP: (!) 193/100 (!) 164/76  (!) 163/92  Pulse: 81 71  83  Resp: $Remo'20  18 20  'BCZZy$ Temp: 98.1 F (36.7 C)   98.3 F (36.8 C)  TempSrc:    Oral  SpO2: 100%   99%  Weight:      Height:        General: Pt is alert, awake, not in acute distress Cardiovascular: RRR, S1/S2 + Respiratory: CTA bilaterally, no wheezing, no rhonchi Abdominal: Soft, NT, ND, bowel sounds + Extremities: no edema, no cyanosis   The results of significant diagnostics from this hospitalization (including imaging, microbiology, ancillary and laboratory) are listed below for reference.      Microbiology: Recent Results (from the past 240 hour(s))  Resp Panel by RT-PCR (Flu A&B, Covid) Nasopharyngeal Swab     Status: None   Collection Time: 03/05/21  8:37 PM   Specimen: Nasopharyngeal Swab; Nasopharyngeal(NP) swabs in vial transport medium  Result Value Ref Range Status   SARS Coronavirus 2 by RT PCR NEGATIVE NEGATIVE Final    Comment: (NOTE) SARS-CoV-2 target nucleic acids are NOT DETECTED.  The SARS-CoV-2 RNA is generally detectable in upper respiratory specimens during the acute phase of  infection. The lowest concentration of SARS-CoV-2 viral copies this assay can detect is 138 copies/mL. A negative result does not preclude SARS-Cov-2 infection and should not be used as the sole basis for treatment or other patient management decisions. A negative result may occur with  improper specimen collection/handling, submission of specimen other than nasopharyngeal swab, presence of viral mutation(s) within the areas targeted by this assay, and inadequate number of viral copies(<138 copies/mL). A negative result must be combined with clinical observations, patient history, and epidemiological information. The expected result is Negative.  Fact Sheet for Patients:  EntrepreneurPulse.com.au  Fact Sheet for Healthcare Providers:  IncredibleEmployment.be  This test is no t yet approved or cleared by the [place] FDA and  has been authorized for detection and/or diagnosis of SARS-CoV-2 by FDA under an Emergency Use Authorization (EUA). This EUA will remain  in effect (meaning this test can be used) for the duration of the COVID-19 declaration under Section 564(b)(1) of the Act, 21 U.S.C.section 360bbb-3(b)(1), unless the authorization is terminated  or revoked sooner.       Influenza A by PCR NEGATIVE NEGATIVE Final   Influenza B by PCR NEGATIVE NEGATIVE Final    Comment: (NOTE) The Xpert Xpress SARS-CoV-2/FLU/RSV plus assay is  intended as an aid in the diagnosis of influenza from Nasopharyngeal swab specimens and should not be used as a sole basis for treatment. Nasal washings and aspirates are unacceptable for Xpert Xpress SARS-CoV-2/FLU/RSV testing.  Fact Sheet for Patients: EntrepreneurPulse.com.au  Fact Sheet for Healthcare Providers: IncredibleEmployment.be  This test is not yet approved or cleared by the [place] FDA and has been authorized for detection and/or diagnosis of SARS-CoV-2 by FDA under an Emergency Use Authorization (EUA). This EUA will remain in effect (meaning this test can be used) for the duration of the COVID-19 declaration under Section 564(b)(1) of the Act, 21 U.S.C. section 360bbb-3(b)(1), unless the authorization is terminated or revoked.  Performed at [facility], [place] [address] [place], [place] [address]   SARS CORONAVIRUS 2 (TAT 6-24 HRS) Nasopharyngeal Nasopharyngeal Swab     Status: None   Collection Time: 03/12/21 11:56 AM   Specimen: Nasopharyngeal Swab  Result Value Ref Range Status   SARS Coronavirus 2 NEGATIVE NEGATIVE Final    Comment: (NOTE) SARS-CoV-2 target nucleic acids are NOT DETECTED.  The SARS-CoV-2 RNA is generally detectable in upper and lower respiratory specimens during the acute phase of infection. Negative results do not preclude SARS-CoV-2 infection, do not rule out co-infections with other pathogens, and should not be used as the sole basis for treatment or other patient management decisions. Negative results must be combined with clinical observations, patient history, and epidemiological information. The expected result is Negative.  Fact Sheet for Patients: SugarRoll.be  Fact Sheet for Healthcare Providers: https://www.woods-mathews.com/  This test is not yet approved or cleared by the [place] FDA and  has been authorized for detection and/or  diagnosis of SARS-CoV-2 by FDA under an Emergency Use Authorization (EUA). This EUA will remain  in effect (meaning this test can be used) for the duration of the COVID-19 declaration under Se ction 564(b)(1) of the Act, 21 U.S.C. section 360bbb-3(b)(1), unless the authorization is terminated or revoked sooner.  Performed at [facility], [place] [address] [place], [place] [address]      Labs: BNP (last 3 results) No results for input(s): BNP in the last 8760 hours. Basic Metabolic Panel: Recent Labs  Lab 03/08/21 0045 03/09/21 0717 03/09/21  1645 03/12/21 0320  NA 140 142 141 137  K 3.2* 3.8 3.7 3.6  CL 106 111 107 104  CO2 $Re'28 23 25 24  'tko$ GLUCOSE 128* 117* 134* 128*  BUN $Re'20 16 21 'Ysh$ 26*  CREATININE 1.27* 1.13* 1.20* 1.18*  CALCIUM 9.4 9.4 9.5 9.6  MG 2.0 2.0 1.8  --   PHOS 3.2 4.0 3.3  --    Liver Function Tests: Recent Labs  Lab 03/08/21 0045 03/09/21 0717 03/09/21 1645 03/12/21 0320  AST  --   --   --  25  ALT  --   --   --  23  ALKPHOS  --   --   --  65  BILITOT  --   --   --  0.8  PROT  --   --   --  6.5  ALBUMIN 3.3* 3.5 3.6 3.4*   No results for input(s): LIPASE, AMYLASE in the last 168 hours. No results for input(s): AMMONIA in the last 168 hours. CBC: Recent Labs  Lab 03/08/21 0045 03/10/21 0426 03/12/21 0320  WBC 8.1 9.0 9.3  HGB 13.2 14.3 14.5  HCT 39.7 42.2 43.2  MCV 95.9 95.0 95.6  PLT 291 283 296   Cardiac Enzymes: No results for input(s): CKTOTAL, CKMB, CKMBINDEX, TROPONINI in the last 168 hours. BNP: Invalid input(s): POCBNP CBG: Recent Labs  Lab 03/13/21 1144 03/13/21 1619 03/13/21 2054 03/14/21 0727 03/14/21 1112  GLUCAP 148* 132* 169* 112* 191*   D-Dimer No results for input(s): DDIMER in the last 72 hours. Hgb A1c No results for input(s): HGBA1C in the last 72 hours. Lipid Profile No results for input(s): CHOL, HDL, LDLCALC, TRIG, CHOLHDL, LDLDIRECT in the last 72 hours. Thyroid function studies No results for  input(s): TSH, T4TOTAL, T3FREE, THYROIDAB in the last 72 hours.  Invalid input(s): FREET3 Anemia work up No results for input(s): VITAMINB12, FOLATE, FERRITIN, TIBC, IRON, RETICCTPCT in the last 72 hours. Urinalysis    Component Value Date/Time   COLORURINE YELLOW 03/08/2015 0855   APPEARANCEUR CLEAR 03/08/2015 0855   LABSPEC 1.015 03/08/2015 0855   PHURINE 6.0 03/08/2015 0855   GLUCOSEU NEGATIVE 03/08/2015 0855   HGBUR NEGATIVE 03/08/2015 0855   BILIRUBINUR NEGATIVE 03/08/2015 0855   KETONESUR NEGATIVE 03/08/2015 0855   PROTEINUR 30 (A) 06/16/2010 2255   UROBILINOGEN 0.2 03/08/2015 0855   NITRITE NEGATIVE 03/08/2015 0855   LEUKOCYTESUR NEGATIVE 03/08/2015 0855   Sepsis Labs Invalid input(s): PROCALCITONIN,  WBC,  LACTICIDVEN Microbiology Recent Results (from the past 240 hour(s))  Resp Panel by RT-PCR (Flu A&B, Covid) Nasopharyngeal Swab     Status: None   Collection Time: 03/05/21  8:37 PM   Specimen: Nasopharyngeal Swab; Nasopharyngeal(NP) swabs in vial transport medium  Result Value Ref Range Status   SARS Coronavirus 2 by RT PCR NEGATIVE NEGATIVE Final    Comment: (NOTE) SARS-CoV-2 target nucleic acids are NOT DETECTED.  The SARS-CoV-2 RNA is generally detectable in upper respiratory specimens during the acute phase of infection. The lowest concentration of SARS-CoV-2 viral copies this assay can detect is 138 copies/mL. A negative result does not preclude SARS-Cov-2 infection and should not be used as the sole basis for treatment or other patient management decisions. A negative result may occur with  improper specimen collection/handling, submission of specimen other than nasopharyngeal swab, presence of viral mutation(s) within the areas targeted by this assay, and inadequate number of viral copies(<138 copies/mL). A negative result must be combined with clinical observations, patient history, and epidemiological information.  The expected result is Negative.  Fact  Sheet for Patients:  EntrepreneurPulse.com.au  Fact Sheet for Healthcare Providers:  IncredibleEmployment.be  This test is no t yet approved or cleared by the [place] FDA and  has been authorized for detection and/or diagnosis of SARS-CoV-2 by FDA under an Emergency Use Authorization (EUA). This EUA will remain  in effect (meaning this test can be used) for the duration of the COVID-19 declaration under Section 564(b)(1) of the Act, 21 U.S.C.section 360bbb-3(b)(1), unless the authorization is terminated  or revoked sooner.       Influenza A by PCR NEGATIVE NEGATIVE Final   Influenza B by PCR NEGATIVE NEGATIVE Final    Comment: (NOTE) The Xpert Xpress SARS-CoV-2/FLU/RSV plus assay is intended as an aid in the diagnosis of influenza from Nasopharyngeal swab specimens and should not be used as a sole basis for treatment. Nasal washings and aspirates are unacceptable for Xpert Xpress SARS-CoV-2/FLU/RSV testing.  Fact Sheet for Patients: EntrepreneurPulse.com.au  Fact Sheet for Healthcare Providers: IncredibleEmployment.be  This test is not yet approved or cleared by the [place] FDA and has been authorized for detection and/or diagnosis of SARS-CoV-2 by FDA under an Emergency Use Authorization (EUA). This EUA will remain in effect (meaning this test can be used) for the duration of the COVID-19 declaration under Section 564(b)(1) of the Act, 21 U.S.C. section 360bbb-3(b)(1), unless the authorization is terminated or revoked.  Performed at [facility], [place] [address] [place], [place] [address]   SARS CORONAVIRUS 2 (TAT 6-24 HRS) Nasopharyngeal Nasopharyngeal Swab     Status: None   Collection Time: 03/12/21 11:56 AM   Specimen: Nasopharyngeal Swab  Result Value Ref Range Status   SARS Coronavirus 2 NEGATIVE NEGATIVE Final    Comment: (NOTE) SARS-CoV-2 target nucleic acids are NOT  DETECTED.  The SARS-CoV-2 RNA is generally detectable in upper and lower respiratory specimens during the acute phase of infection. Negative results do not preclude SARS-CoV-2 infection, do not rule out co-infections with other pathogens, and should not be used as the sole basis for treatment or other patient management decisions. Negative results must be combined with clinical observations, patient history, and epidemiological information. The expected result is Negative.  Fact Sheet for Patients: SugarRoll.be  Fact Sheet for Healthcare Providers: https://www.woods-mathews.com/  This test is not yet approved or cleared by the [place] FDA and  has been authorized for detection and/or diagnosis of SARS-CoV-2 by FDA under an Emergency Use Authorization (EUA). This EUA will remain  in effect (meaning this test can be used) for the duration of the COVID-19 declaration under Se ction 564(b)(1) of the Act, 21 U.S.C. section 360bbb-3(b)(1), unless the authorization is terminated or revoked sooner.  Performed at [facility], [place] [address] [place], [place] [address]    Time spent: 30 min  SIGNED:   Marylu Lund, MD  [facility] 03/14/2021, 12:50 PM  If 7PM-7AM, please contact night-coverage

## 2021-03-14 NOTE — Discharge Instructions (Signed)
Take aspirin with plavix together for 3 weeks, then take plavix by itself afterwards

## 2021-03-14 NOTE — Progress Notes (Signed)
Occupational Therapy Treatment Patient Details Name: Jade [name] MRN: [id number] DOB: 27-Jan-1956 Today's Date: 03/14/2021    History of present illness Pt adm 7/11 with binocular blurry vision. MRI with acute L thalamic , anterior left frontal and possible right frontal and multiple remote lacunar infarcts PMH - DM, CVA, HTN and COPD.   OT comments  Patient met seated in recliner in agreement with OT treatment session with focus on safe d/c planning, self-care re-education and acquisition use of AD/DME. Patient reports that her husband leaves the house at 5am for work and doesn't return until 4pm M-F. Patient also states that her son lives 67 miles away and does not have transportation. This date patient unable to transfer to and from [facility] with less than Min A secondary to balance deficits with 2 episodes of posterior LOB requiring external assist to correct. Patient continues to be a high fall risk secondary to visual deficits, balance deficits, and generalized weakness/debility and would benefit from continued acute OT services to maximize safety and independence with self-care tasks. Recommendation for max HH services (PT/OT, NT) and 24hr supervision/assist. Patient may also benefit from meal service (meals on wheels) as she is unable to safely prepare meals while her husband is away at work.    Follow Up Recommendations  SNF;Other (comment);Home health OT;Supervision/Assistance - 24 hour (Patient refused vaccination needed for SNF placement. Patient to d/c home with [facility] services.)    Equipment Recommendations  Other (comment);Tub/shower bench (rolling walker)    Recommendations for Other Services      Precautions / Restrictions Precautions Precautions: Fall Precaution Comments: High fall risk;  bilateral vertical gaze palsy Restrictions Weight Bearing Restrictions: No       Mobility Bed Mobility Overal bed mobility: Needs Assistance             General bed mobility  comments: Seated in recliner upon entry.    Transfers Overall transfer level: Needs assistance Equipment used: Rolling walker (2 wheeled) Transfers: Sit to/from [facility] Sit to Stand: Min assist Stand pivot transfers: Min assist       General transfer comment: Min A for sit to stand from low recliner and from [facility] and Min A for stand-pivot transfers. Unable to complete functional transfers safely with less than Min A.    Balance Overall balance assessment: Needs assistance;History of Falls Sitting-balance support: Bilateral upper extremity supported;Feet unsupported Sitting balance-Leahy Scale: Poor   Postural control: Right lateral lean Standing balance support: Bilateral upper extremity supported Standing balance-Leahy Scale: Poor Standing balance comment: Reliant on BUE support on RW and external assist for dynamic balance. High fall risk.                           ADL either performed or assessed with clinical judgement   ADL Overall ADL's : Needs assistance/impaired                         Toilet Transfer: Minimal assistance;RW Toilet Transfer Details (indicate cue type and reason): Min A for to boost and to steady once standing. Planned failure with patient demonstrating posterior LOB x2 falling back onto [facility] requiring external assist to correct. Toileting- Clothing Manipulation and Hygiene: Moderate assistance;Sit to/from stand Toileting - Clothing Manipulation Details (indicate cue type and reason): Mod A for hygiene/clothing management with cues for hand/foot placement. Patient with narrow BOS.       General ADL Comments: Patient greatly limited  by blurry vision making ADLs and mobility a hardship. Patient also limited by generalized weakness and decreased static/dynamic standing/sitting balance.     Vision   Additional Comments: Patient reports continued double vision. Has not been compliant with occlusion therapy.    Perception     Praxis      Cognition Arousal/Alertness: Awake/alert Behavior During Therapy: WFL for tasks assessed/performed Overall Cognitive Status: Within Functional Limits for tasks assessed                                          Exercises     Shoulder Instructions       General Comments      Pertinent Vitals/ Pain       Pain Assessment: No/denies pain  Home Living                                          Prior Functioning/Environment              Frequency  Min 2X/week        Progress Toward Goals  OT Goals(current goals can now be found in the care plan section)  Progress towards OT goals: Progressing toward goals  Acute Rehab OT Goals Patient Stated Goal: return home OT Goal Formulation: With patient Time For Goal Achievement: 03/21/21 Potential to Achieve Goals: Good ADL Goals Pt Will Perform Upper Body Dressing: with set-up;sitting Pt Will Perform Lower Body Dressing: with supervision;sit to/from stand;with adaptive equipment Pt Will Transfer to Toilet: with supervision;ambulating;bedside commode Pt Will Perform Toileting - Clothing Manipulation and hygiene: with supervision;sit to/from stand;sitting/lateral leans Additional ADL Goal #1: Patient will maintain static sitting balance at EOB with distant superision A for 8-10 minutes in prep for ADLs and functional mobility. Additional ADL Goal #2: Patient will maintain static standing balance with close supervision A and use of RW in prep for standing grooming tasks at sink level.  Plan Frequency remains appropriate;Discharge plan needs to be updated    Co-evaluation                 AM-PAC OT "6 Clicks" Daily Activity     Outcome Measure   Help from another person eating meals?: None Help from another person taking care of personal grooming?: A Little Help from another person toileting, which includes using toliet, bedpan, or urinal?: A Lot Help  from another person bathing (including washing, rinsing, drying)?: A Lot Help from another person to put on and taking off regular upper body clothing?: A Little Help from another person to put on and taking off regular lower body clothing?: A Lot 6 Click Score: 16    End of Session Equipment Utilized During Treatment: Gait belt;Rolling walker  OT Visit Diagnosis: Unsteadiness on feet (R26.81);Other abnormalities of gait and mobility (R26.89);Muscle weakness (generalized) (M62.81);Repeated falls (R29.6);Low vision, both eyes (H54.2)   Activity Tolerance Patient tolerated treatment well   Patient Left in chair;with call bell/phone within reach;with chair alarm set   Nurse Communication Mobility status;Other (comment)        Time: 9381-0175 OT Time Calculation (min): 25 min  Charges: OT General Charges $OT Visit: 1 Visit OT Treatments $Self Care/Home Management : 8-22 mins $Therapeutic Activity: 8-22 mins  Tanya Marvin H. OTR/L Supplemental OT, Department of rehab services [phone]  Hisao Doo R H. 03/14/2021, 1:35 PM

## 2021-03-14 NOTE — TOC Initial Note (Addendum)
Transition of Care [facility]) - Initial/Assessment Note    Patient Details  Name: Jade [name] MRN: [id number] Date of Birth: February 05, 1956  [facility] CM/SW Contact:    Lawerance Sabal, RN Phone Number: 03/14/2021, 1:11 PM  Clinical Narrative:               [facility] that patient will DC today. RW requested to be brought to room today prior to DC Meds filled through [facility] pharmacy, please ensure patient goes home w medications.      Expected Discharge Plan: Skilled Nursing Facility Barriers to Discharge: Continued Medical Work up   Patient Goals and CMS Choice Patient states their goals for this hospitalization and ongoing recovery are:: Wants to get better in order to be able to go home CMS Medicare.gov Compare Post Acute Care list provided to:: Patient Choice offered to / list presented to : Patient  Expected Discharge Plan and Services Expected Discharge Plan: Skilled Nursing Facility In-house Referral: NA Discharge Planning Services: CM Consult Post Acute Care Choice: Skilled Nursing Facility Living arrangements for the past 2 months: Single Family Home Expected Discharge Date: 03/14/21               DME Arranged: N/A DME Agency: NA       HH Arranged: NA HH Agency: NA        Prior Living Arrangements/Services Living arrangements for the past 2 months: Single Family Home Lives with:: Spouse Patient language and need for interpreter reviewed:: Yes Do you feel safe going back to the place where you live?: Yes      Need for Family Participation in Patient Care: Yes (Comment) Care giver support system in place?: No (comment) Current home services:  (none) Criminal Activity/Legal Involvement Pertinent to Current Situation/Hospitalization: No - Comment as needed  Activities of Daily Living      Permission Sought/Granted   Permission granted to share information with : No              Emotional Assessment Appearance:: Appears stated  age Attitude/Demeanor/Rapport: Gracious Affect (typically observed): Accepting Orientation: : Oriented to Self, Oriented to Place, Oriented to  Time, Oriented to Situation Alcohol / Substance Use: Not Applicable Psych Involvement: Yes (comment)  Admission diagnosis:  Hypertensive emergency [I16.1] Stroke-like symptoms [R29.90] Acute CVA (cerebrovascular accident) 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] Patient Active Problem List   Diagnosis Date Noted   Stroke-like symptoms 03/05/2021   Acute CVA (cerebrovascular accident) (HCC) 03/21/2018   Obesity, Class III, BMI 40-49.9 (morbid obesity) (HCC) 03/21/2018   IBS (irritable bowel syndrome) 04/12/2015   Hair loss 10/20/2014   Primary osteoarthritis of left knee 09/29/2014   Visit for screening mammogram 08/30/2014   Routine general medical examination at a health care facility 08/30/2014   Tinea corporis 12/21/2013   Essential hypertension, benign 12/21/2013   Low back pain 08/24/2013   Patient noncompliant with statin medication 02/23/2013   H/O abnormal Pap smear 10/24/2012   Osteopenia 09/26/2012   Diabetic neuropathy, painful (HCC) 05/26/2012   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   Pure hypercholesterolemia 03/20/2012   Chronic venous insufficiency 03/20/2012   PCP:  System, Provider Not In Pharmacy:   [facility] [place], [place] - [address] [place] [address] Phone: [phone] Fax: [phone]  [facility] [address] [place] [address] Phone: [phone] Fax: [phone]  Social Determinants of Health (SDOH) Interventions    Readmission Risk Interventions No flowsheet data found.   

## 2021-03-17 ENCOUNTER — Other Ambulatory Visit: Payer: Self-pay | Admitting: *Deleted

## 2021-03-17 NOTE — Patient Outreach (Signed)
[facility]) Care Management  03/17/2021  Jade [name] [birth year] [id number]   RED ON EMMI ALERT - Stroke Day # 1 Date: 7/21 Red Alert Reason: Not scheduled follow up appointment and Problems setting up rehab   Outreach attempt #1, successful.  Identity verified.  This care manager introduced self and stated purpose of call.  [facility] care management services explained.    Member report she is doing the best she can to improve, remains in good spirits since discharge.  She has received call from home health, they will start service on Tuesday next week.  Report she had a fall yesterday and had to call EMS to help get her from the floor.  She lives at home with her husband but he works 10 hour days, leaving before 5 am.  He prepares meals for her to warm up prior to leaving.  Rehab was suggested, member refused due to not wanting to get vaccination.  Only other family in the area is son, support limited as he does not have transportation.    She has follow up appointment set up with neurology on 10/19.  Aware of need to schedule with PCP but has been having trouble with vision and unable to call while alone to schedule.  When husband gets back home, offices are closed.  Conference call placed to PCP office ([facility]), unable to schedule visit due to wait time but voice message left to call member for appointment.  Denies any urgent concerns, encouraged to contact this care manager with questions.   Plan: RN CM will follow up with member within the next 2 weeks.  Kemper Durie, [place], MSN [facility] Care Management  [facility] Manager [phone]

## 2021-03-18 ENCOUNTER — Other Ambulatory Visit (HOSPITAL_COMMUNITY): Payer: Self-pay

## 2021-03-22 ENCOUNTER — Telehealth (HOSPITAL_COMMUNITY): Payer: Self-pay | Admitting: Pharmacist

## 2021-03-22 NOTE — Telephone Encounter (Signed)
Third and final attempt to reach patient was unsuccessful. 

## 2021-03-31 ENCOUNTER — Other Ambulatory Visit: Payer: Self-pay | Admitting: *Deleted

## 2021-03-31 NOTE — Patient Outreach (Signed)
[facility]) Care Management  03/31/2021  KENNIYAH SASAKI Jan 21, 1956 [id number]   RED ON EMMI ALERT - Stroke Day # 13 Date: 8/2 Red Alert Reason: Not had follow up appointment   Outreach attempt #1, unsuccessful, HIPAA compliant voice message left.   Plan: RN CM will follow up within the next 3-4 business days.  Kemper Durie, [place], MSN [facility] Care Management  [facility] Manager [phone]

## 2021-04-01 ENCOUNTER — Emergency Department (HOSPITAL_COMMUNITY): Payer: Medicare Other

## 2021-04-01 ENCOUNTER — Encounter (HOSPITAL_COMMUNITY): Payer: Self-pay

## 2021-04-01 ENCOUNTER — Inpatient Hospital Stay (HOSPITAL_COMMUNITY)
Admission: EM | Admit: 2021-04-01 | Discharge: 2021-04-13 | DRG: 035 | Disposition: A | Discharge status: Skilled Nursing Facility | Payer: Medicare Other | Attending: Internal Medicine | Admitting: Internal Medicine

## 2021-04-01 ENCOUNTER — Other Ambulatory Visit: Payer: Self-pay

## 2021-04-01 DIAGNOSIS — R262 Difficulty in walking, not elsewhere classified: Secondary | ICD-10-CM | POA: Diagnosis present

## 2021-04-01 DIAGNOSIS — M797 Fibromyalgia: Secondary | ICD-10-CM | POA: Diagnosis present

## 2021-04-01 DIAGNOSIS — I672 Cerebral atherosclerosis: Secondary | ICD-10-CM | POA: Diagnosis present

## 2021-04-01 DIAGNOSIS — I700 Atherosclerosis of aorta: Secondary | ICD-10-CM | POA: Diagnosis present

## 2021-04-01 DIAGNOSIS — G8321 Monoplegia of upper limb affecting right dominant side: Secondary | ICD-10-CM | POA: Diagnosis present

## 2021-04-01 DIAGNOSIS — I63239 Cerebral infarction due to unspecified occlusion or stenosis of unspecified carotid arteries: Secondary | ICD-10-CM | POA: Diagnosis present

## 2021-04-01 DIAGNOSIS — R29708 NIHSS score 8: Secondary | ICD-10-CM | POA: Diagnosis present

## 2021-04-01 DIAGNOSIS — I63412 Cerebral infarction due to embolism of left middle cerebral artery: Principal | ICD-10-CM | POA: Diagnosis present

## 2021-04-01 DIAGNOSIS — E785 Hyperlipidemia, unspecified: Secondary | ICD-10-CM | POA: Diagnosis present

## 2021-04-01 DIAGNOSIS — Z8249 Family history of ischemic heart disease and other diseases of the circulatory system: Secondary | ICD-10-CM

## 2021-04-01 DIAGNOSIS — I6523 Occlusion and stenosis of bilateral carotid arteries: Secondary | ICD-10-CM | POA: Diagnosis present

## 2021-04-01 DIAGNOSIS — E11319 Type 2 diabetes mellitus with unspecified diabetic retinopathy without macular edema: Secondary | ICD-10-CM | POA: Diagnosis present

## 2021-04-01 DIAGNOSIS — Z7982 Long term (current) use of aspirin: Secondary | ICD-10-CM

## 2021-04-01 DIAGNOSIS — I7389 Other specified peripheral vascular diseases: Secondary | ICD-10-CM | POA: Diagnosis present

## 2021-04-01 DIAGNOSIS — R29810 Facial weakness: Secondary | ICD-10-CM | POA: Diagnosis present

## 2021-04-01 DIAGNOSIS — E66811 Obesity, class 1: Secondary | ICD-10-CM | POA: Diagnosis present

## 2021-04-01 DIAGNOSIS — I130 Hypertensive heart and chronic kidney disease with heart failure and stage 1 through stage 4 chronic kidney disease, or unspecified chronic kidney disease: Secondary | ICD-10-CM | POA: Diagnosis present

## 2021-04-01 DIAGNOSIS — E1122 Type 2 diabetes mellitus with diabetic chronic kidney disease: Secondary | ICD-10-CM | POA: Diagnosis present

## 2021-04-01 DIAGNOSIS — Z66 Do not resuscitate: Secondary | ICD-10-CM | POA: Diagnosis present

## 2021-04-01 DIAGNOSIS — K219 Gastro-esophageal reflux disease without esophagitis: Secondary | ICD-10-CM | POA: Diagnosis present

## 2021-04-01 DIAGNOSIS — M62838 Other muscle spasm: Secondary | ICD-10-CM | POA: Diagnosis present

## 2021-04-01 DIAGNOSIS — G459 Transient cerebral ischemic attack, unspecified: Secondary | ICD-10-CM | POA: Diagnosis present

## 2021-04-01 DIAGNOSIS — I5032 Chronic diastolic (congestive) heart failure: Secondary | ICD-10-CM | POA: Diagnosis present

## 2021-04-01 DIAGNOSIS — E876 Hypokalemia: Secondary | ICD-10-CM | POA: Diagnosis not present

## 2021-04-01 DIAGNOSIS — I10 Essential (primary) hypertension: Secondary | ICD-10-CM | POA: Diagnosis present

## 2021-04-01 DIAGNOSIS — W19XXXA Unspecified fall, initial encounter: Secondary | ICD-10-CM | POA: Diagnosis present

## 2021-04-01 DIAGNOSIS — G2581 Restless legs syndrome: Secondary | ICD-10-CM | POA: Diagnosis present

## 2021-04-01 DIAGNOSIS — G4733 Obstructive sleep apnea (adult) (pediatric): Secondary | ICD-10-CM | POA: Diagnosis present

## 2021-04-01 DIAGNOSIS — J449 Chronic obstructive pulmonary disease, unspecified: Secondary | ICD-10-CM | POA: Diagnosis present

## 2021-04-01 DIAGNOSIS — I639 Cerebral infarction, unspecified: Secondary | ICD-10-CM | POA: Diagnosis present

## 2021-04-01 DIAGNOSIS — Z79899 Other long term (current) drug therapy: Secondary | ICD-10-CM

## 2021-04-01 DIAGNOSIS — Z28310 Unvaccinated for covid-19: Secondary | ICD-10-CM

## 2021-04-01 DIAGNOSIS — E1149 Type 2 diabetes mellitus with other diabetic neurological complication: Secondary | ICD-10-CM | POA: Diagnosis present

## 2021-04-01 DIAGNOSIS — Z20822 Contact with and (suspected) exposure to covid-19: Secondary | ICD-10-CM | POA: Diagnosis present

## 2021-04-01 DIAGNOSIS — I69351 Hemiplegia and hemiparesis following cerebral infarction affecting right dominant side: Secondary | ICD-10-CM

## 2021-04-01 DIAGNOSIS — Z87891 Personal history of nicotine dependence: Secondary | ICD-10-CM

## 2021-04-01 DIAGNOSIS — E7800 Pure hypercholesterolemia, unspecified: Secondary | ICD-10-CM | POA: Diagnosis present

## 2021-04-01 DIAGNOSIS — N1831 Chronic kidney disease, stage 3a: Secondary | ICD-10-CM | POA: Diagnosis present

## 2021-04-01 DIAGNOSIS — Z6832 Body mass index (BMI) 32.0-32.9, adult: Secondary | ICD-10-CM

## 2021-04-01 DIAGNOSIS — R233 Spontaneous ecchymoses: Secondary | ICD-10-CM | POA: Diagnosis present

## 2021-04-01 DIAGNOSIS — E669 Obesity, unspecified: Secondary | ICD-10-CM | POA: Diagnosis present

## 2021-04-01 DIAGNOSIS — Z7902 Long term (current) use of antithrombotics/antiplatelets: Secondary | ICD-10-CM

## 2021-04-01 DIAGNOSIS — R278 Other lack of coordination: Secondary | ICD-10-CM | POA: Diagnosis present

## 2021-04-01 LAB — RAPID URINE DRUG SCREEN, HOSP PERFORMED
Amphetamines: NOT DETECTED
Barbiturates: NOT DETECTED
Benzodiazepines: NOT DETECTED
Cocaine: NOT DETECTED
Opiates: NOT DETECTED
Tetrahydrocannabinol: NOT DETECTED

## 2021-04-01 LAB — URINALYSIS, ROUTINE W REFLEX MICROSCOPIC
Bilirubin Urine: NEGATIVE
Glucose, UA: NEGATIVE mg/dL
Hgb urine dipstick: NEGATIVE
Ketones, ur: NEGATIVE mg/dL
Leukocytes,Ua: NEGATIVE
Nitrite: NEGATIVE
Protein, ur: NEGATIVE mg/dL
Specific Gravity, Urine: 1.011 (ref 1.005–1.030)
pH: 6 (ref 5.0–8.0)

## 2021-04-01 LAB — CBC
HCT: 43.2 % (ref 36.0–46.0)
Hemoglobin: 15 g/dL (ref 12.0–15.0)
MCH: 32.5 pg (ref 26.0–34.0)
MCHC: 34.7 g/dL (ref 30.0–36.0)
MCV: 93.7 fL (ref 80.0–100.0)
Platelets: 365 10*3/uL (ref 150–400)
RBC: 4.61 MIL/uL (ref 3.87–5.11)
RDW: 12.7 % (ref 11.5–15.5)
WBC: 10.5 10*3/uL (ref 4.0–10.5)
nRBC: 0 % (ref 0.0–0.2)

## 2021-04-01 LAB — COMPREHENSIVE METABOLIC PANEL
ALT: 17 U/L (ref 0–44)
AST: 23 U/L (ref 15–41)
Albumin: 4 g/dL (ref 3.5–5.0)
Alkaline Phosphatase: 75 U/L (ref 38–126)
Anion gap: 11 (ref 5–15)
BUN: 21 mg/dL (ref 8–23)
CO2: 22 mmol/L (ref 22–32)
Calcium: 10 mg/dL (ref 8.9–10.3)
Chloride: 106 mmol/L (ref 98–111)
Creatinine, Ser: 0.96 mg/dL (ref 0.44–1.00)
GFR, Estimated: >60 mL/min (ref >60)
Glucose, Bld: 168 mg/dL — ABNORMAL HIGH (ref 70–99)
Potassium: 3.3 mmol/L — ABNORMAL LOW (ref 3.5–5.1)
Sodium: 139 mmol/L (ref 135–145)
Total Bilirubin: 1 mg/dL (ref 0.3–1.2)
Total Protein: 7.1 g/dL (ref 6.5–8.1)

## 2021-04-01 LAB — DIFFERENTIAL
Abs Immature Granulocytes: 0.03 10*3/uL (ref 0.00–0.07)
Basophils Absolute: 0.1 10*3/uL (ref 0.0–0.1)
Basophils Relative: 1 %
Eosinophils Absolute: 0.1 10*3/uL (ref 0.0–0.5)
Eosinophils Relative: 1 %
Immature Granulocytes: 0 %
Lymphocytes Relative: 17 %
Lymphs Abs: 1.7 10*3/uL (ref 0.7–4.0)
Monocytes Absolute: 0.8 10*3/uL (ref 0.1–1.0)
Monocytes Relative: 8 %
Neutro Abs: 7.8 10*3/uL — ABNORMAL HIGH (ref 1.7–7.7)
Neutrophils Relative %: 73 %

## 2021-04-01 LAB — CBG MONITORING, ED: Glucose-Capillary: 171 mg/dL — ABNORMAL HIGH (ref 70–99)

## 2021-04-01 LAB — PROTIME-INR
INR: 1 (ref 0.8–1.2)
Prothrombin Time: 13.1 seconds (ref 11.4–15.2)

## 2021-04-01 LAB — ETHANOL: Alcohol, Ethyl (B): <10 mg/dL (ref <10)

## 2021-04-01 LAB — APTT: aPTT: 25 seconds (ref 24–36)

## 2021-04-01 MED ORDER — LORAZEPAM 2 MG/ML IJ SOLN
1.0000 mg | Freq: Once | INTRAMUSCULAR | Status: AC | PRN
  Administered 2021-04-01: 1 mg via INTRAVENOUS
  Filled 2021-04-01: qty 1

## 2021-04-01 NOTE — ED Notes (Signed)
Pt to MRI

## 2021-04-01 NOTE — ED Provider Notes (Signed)
[facility] EMERGENCY DEPARTMENT Provider Note   CSN: [id number] Arrival date & time: 04/01/21  2007     History Chief Complaint  Patient presents with   Numbness    Jade [name] is a 65 y.o. female.  HPI 65 year old female presents with right hand weakness.  Started today around 2:30 PM.  She was trying to get off the toilet and use the grab bar and noticed that her right hand was very weak.  It has progressively gotten more weak since.  She was not able to get up because of this and fell to the ground.  However now she is able to ambulate and her legs feel fine but is just her right hand/wrist.  It is not numb.  She had a stroke a few weeks ago that was primarily of vision related stroke.  Previous to that she has had a stroke involving her right side that has left her with some mild residual right hand deficits but this is much worse.  No new vision changes.  No headaches, chest pain or other acute illness.  No speech symptoms, facial symptoms or leg symptoms.  Past Medical History:  Diagnosis Date   Arthritis    "back, knees, some in my left hip" (03/21/2018)   Chronic kidney disease    "was told I had 25% kidney function in early 2012" (03/21/2018)   COPD (chronic obstructive pulmonary disease) ([place])    CVA (cerebral vascular accident) ([place]) 03/21/2018   "numb all over; head to toe" (03/21/2018)   Fibromyalgia    History of gout    Hyperlipidemia    Hypertension    Migraine    "used to get them monthly during menopause" (03/21/2018)   Neuromuscular disorder ([place])    OSA (obstructive sleep apnea)    "don't tolerate the machine" (03/21/2018)   Pneumonia ~ 2007   Type 2 diabetes, diet controlled ([place])     Patient Active Problem List   Diagnosis Date Noted   Stroke-like symptoms 03/05/2021   Acute CVA (cerebrovascular accident) ([place]) 03/21/2018   Obesity, Class III, BMI 40-49.9 (morbid obesity) ([place]) 03/21/2018   IBS (irritable bowel syndrome)  04/12/2015   Hair loss 10/20/2014   Primary osteoarthritis of left knee 09/29/2014   Visit for screening mammogram 08/30/2014   Routine general medical examination at a health care facility 08/30/2014   Tinea corporis 12/21/2013   Essential hypertension, benign 12/21/2013   Low back pain 08/24/2013   Patient noncompliant with statin medication 02/23/2013   H/O abnormal Pap smear 10/24/2012   Osteopenia 09/26/2012   Diabetic neuropathy, painful ([place]) 05/26/2012   Diabetes mellitus type 2 with neurological manifestations ([place]) 03/20/2012   Pure hypercholesterolemia 03/20/2012   Chronic venous insufficiency 03/20/2012    Past Surgical History:  Procedure Laterality Date   HERNIA REPAIR     LAPAROSCOPIC CHOLECYSTECTOMY  06/2007   "no UHR w/this" (03/21/2018)   TONSILLECTOMY     UMBILICAL HERNIA REPAIR  2009     OB History     Gravida  3   Para  1   Term  1   Preterm      AB  2   Living  1      SAB  2   IAB      Ectopic      Multiple      Live Births  1           Family History  Problem Relation Age of Onset  Heart disease Father    Leukemia Father    Alcohol abuse Son    Other Mother    Cancer Neg Hx    Diabetes Neg Hx    Hearing loss Neg Hx    Hyperlipidemia Neg Hx    Hypertension Neg Hx    Kidney disease Neg Hx    Stroke Neg Hx     Social History   Tobacco Use   Smoking status: Former    Packs/day: 1.25    Years: 16.00    Pack years: 20.00    Types: Cigarettes    Quit date: 08/28/1987    Years since quitting: 33.6   Smokeless tobacco: Never  Vaping Use   Vaping Use: Never used  Substance Use Topics   Alcohol use: Not Currently   Drug use: Not Currently    Types: Marijuana    Home Medications Prior to Admission medications   Medication Sig Start Date End Date Taking? Authorizing Provider  amLODipine (NORVASC) 10 MG tablet Take 1 tablet (10 mg total) by mouth daily. 03/15/21 04/14/21 Yes Donne Hazel, MD  aspirin 81 MG EC  tablet Take 1 tablet (81 mg total) by mouth daily for 21 days. Swallow whole. 03/15/21 04/05/21 Yes Donne Hazel, MD  atorvastatin (LIPITOR) 80 MG tablet Take 1 tablet (80 mg total) by mouth daily. 03/15/21 04/14/21 Yes Donne Hazel, MD  b complex vitamins capsule Take 1 capsule by mouth daily.   Yes [provider]  Cholecalciferol (VITAMIN D-3) 5000 UNITS TABS Take 5,000 Units by mouth daily.    Yes [provider]  clopidogrel (PLAVIX) 75 MG tablet Take 1 tablet (75 mg total) by mouth daily. 03/15/21 04/14/21 Yes Donne Hazel, MD  Coenzyme Q10 (COQ-10) 100 MG CAPS Take 100 mg by mouth at bedtime.   Yes [provider]  hydrochlorothiazide (MICROZIDE) 12.5 MG capsule Take 1 capsule (12.5 mg total) by mouth daily. 02/17/21 02/17/22 Yes Fransico Meadow, PA-C  magnesium gluconate (MAGONATE) 500 MG tablet Take 1,000 mg by mouth at bedtime.   Yes [provider]  Menaquinone-7 (VITAMIN K2 PO) Take 1 tablet by mouth daily.   Yes [provider]  Omega-3 Fatty Acids (FISH OIL) 600 MG CAPS Take 600 mg by mouth daily.   Yes [provider]  Blood Glucose Calibration (ACCU-CHEK SMARTVIEW CONTROL) LIQD Test up to TID dx:E11.40 09/13/14   Janith Lima, MD  Blood Glucose Monitoring Suppl (ACCU-CHEK NANO SMARTVIEW) W/DEVICE KIT Test up to TID dx:E11.40 09/13/14   Janith Lima, MD  glucose blood (ACCU-CHEK SMARTVIEW) test strip Test up to TID dx:E11.40 09/13/14   Janith Lima, MD  Lancets Misc. (ACCU-CHEK FASTCLIX LANCET) KIT Test up to TID dx:E11.40 09/13/14   Janith Lima, MD    Allergies    Nickel  Review of Systems   Review of Systems  Eyes:  Negative for visual disturbance.  Cardiovascular:  Negative for chest pain.  Musculoskeletal:  Negative for neck pain.  Neurological:  Positive for weakness. Negative for numbness and headaches.  All other systems reviewed and are negative.  Physical Exam Updated Vital Signs BP (!) 170/78    Pulse 83   Temp 98.3 F (36.8 C) (Oral)   Resp 17   Ht $R'5\' 1"'YZ$  (1.549 m)   Wt 77.1 kg   SpO2 97%   BMI 32.12 kg/m   Physical Exam Vitals and nursing note reviewed.  Constitutional:      General: She  is not in acute distress.    Appearance: She is well-developed. She is not ill-appearing or diaphoretic.  HENT:     Head: Normocephalic and atraumatic.     Right Ear: External ear normal.     Left Ear: External ear normal.     Nose: Nose normal.  Eyes:     General:        Right eye: No discharge.        Left eye: No discharge.     Extraocular Movements: Extraocular movements intact.     Pupils: Pupils are equal, round, and reactive to light.  Cardiovascular:     Rate and Rhythm: Normal rate and regular rhythm.     Pulses:          Radial pulses are 2+ on the right side.     Heart sounds: Normal heart sounds.  Pulmonary:     Effort: Pulmonary effort is normal.     Breath sounds: Normal breath sounds.  Abdominal:     Palpations: Abdomen is soft.     Tenderness: There is no abdominal tenderness.  Skin:    General: Skin is warm and dry.  Neurological:     Mental Status: She is alert.     Comments: CN 3-12 grossly intact. 5/5 strength in left upper and bilateral lower extremities. Mild right arm drift. Right hand in particular is weak. Hand/wrist held in mild flexion/wrist drop (this is new per her). Grossly normal sensation.   Psychiatric:        Mood and Affect: Mood is not anxious.    ED Results / Procedures / Treatments   Labs (all labs ordered are listed, but only abnormal results are displayed) Labs Reviewed  DIFFERENTIAL - Abnormal; Notable for the following components:      Result Value   Neutro Abs 7.8 (*)    All other components within normal limits  COMPREHENSIVE METABOLIC PANEL - Abnormal; Notable for the following components:   Potassium 3.3 (*)    Glucose, Bld 168 (*)    All other components within normal limits  URINALYSIS, ROUTINE W REFLEX MICROSCOPIC -  Abnormal; Notable for the following components:   Color, Urine STRAW (*)    All other components within normal limits  CBG MONITORING, ED - Abnormal; Notable for the following components:   Glucose-Capillary 171 (*)    All other components within normal limits  ETHANOL  PROTIME-INR  APTT  CBC  RAPID URINE DRUG SCREEN, HOSP PERFORMED    EKG EKG Interpretation  Date/Time:  Saturday April 01 2021 20:25:01 EDT Ventricular Rate:  81 PR Interval:  158 QRS Duration: 118 QT Interval:  405 QTC Calculation: 471 R Axis:   -60 Text Interpretation: Sinus rhythm LVH with IVCD, LAD and secondary repol abnrm  similar to July 2022 Confirmed by Sherwood Gambler [phone]) on 04/01/2021 8:39:23 PM  Radiology CT HEAD WO CONTRAST  Result Date: 04/01/2021 CLINICAL DATA:  Right side numbness EXAM: CT HEAD WITHOUT CONTRAST TECHNIQUE: Contiguous axial images were obtained from the base of the skull through the vertex without intravenous contrast. COMPARISON:  03/06/2021 FINDINGS: Brain: There is atrophy and chronic small vessel disease changes. No acute intracranial abnormality. Specifically, no hemorrhage, hydrocephalus, mass lesion, acute infarction, or significant intracranial injury. Vascular: No hyperdense vessel or unexpected calcification. Skull: No acute calvarial abnormality. Sinuses/Orbits: No acute findings Other: None IMPRESSION: Atrophy, chronic microvascular disease. No acute intracranial abnormality. Electronically Signed   By: Rolm Baptise M.D.   On: 04/01/2021  21:50    Procedures Procedures   Medications Ordered in ED Medications  LORazepam (ATIVAN) injection 1 mg (1 mg Intravenous Given 04/01/21 2251)    ED Course  I have reviewed the triage vital signs and the nursing notes.  Pertinent labs & imaging results that were available during my care of the patient were reviewed by me and considered in my medical decision making (see chart for details).    MDM Rules/Calculators/A&P                            Patient's right hand weakness appears to be worse than typical.  There are no other new neuro symptoms.  Discussed with Dr. Leonel Ramsay, will get MRI. Care to Dr. Stark Jock. Final Clinical Impression(s) / ED Diagnoses Final diagnoses:  None    Rx / DC Orders ED Discharge Orders     None        Sherwood Gambler, MD 04/01/21 2310

## 2021-04-01 NOTE — ED Triage Notes (Signed)
Pt brought in by EMS c/o right sided numbness since 1430. Pt has had a prior history of stroke reporting last stroke was 2 weeks ago. Pt alert and oriented and respirations even and unlabored.

## 2021-04-02 ENCOUNTER — Observation Stay (HOSPITAL_COMMUNITY): Payer: Medicare Other

## 2021-04-02 DIAGNOSIS — Z20822 Contact with and (suspected) exposure to covid-19: Secondary | ICD-10-CM | POA: Diagnosis present

## 2021-04-02 DIAGNOSIS — I63533 Cerebral infarction due to unspecified occlusion or stenosis of bilateral posterior cerebral arteries: Secondary | ICD-10-CM | POA: Diagnosis not present

## 2021-04-02 DIAGNOSIS — E669 Obesity, unspecified: Secondary | ICD-10-CM | POA: Diagnosis not present

## 2021-04-02 DIAGNOSIS — I639 Cerebral infarction, unspecified: Secondary | ICD-10-CM | POA: Diagnosis not present

## 2021-04-02 DIAGNOSIS — I10 Essential (primary) hypertension: Secondary | ICD-10-CM | POA: Diagnosis not present

## 2021-04-02 DIAGNOSIS — I700 Atherosclerosis of aorta: Secondary | ICD-10-CM | POA: Diagnosis present

## 2021-04-02 DIAGNOSIS — R7309 Other abnormal glucose: Secondary | ICD-10-CM | POA: Diagnosis not present

## 2021-04-02 DIAGNOSIS — G8191 Hemiplegia, unspecified affecting right dominant side: Secondary | ICD-10-CM | POA: Diagnosis not present

## 2021-04-02 DIAGNOSIS — J449 Chronic obstructive pulmonary disease, unspecified: Secondary | ICD-10-CM

## 2021-04-02 DIAGNOSIS — G8321 Monoplegia of upper limb affecting right dominant side: Secondary | ICD-10-CM | POA: Diagnosis present

## 2021-04-02 DIAGNOSIS — E1149 Type 2 diabetes mellitus with other diabetic neurological complication: Secondary | ICD-10-CM

## 2021-04-02 DIAGNOSIS — M797 Fibromyalgia: Secondary | ICD-10-CM | POA: Diagnosis present

## 2021-04-02 DIAGNOSIS — E785 Hyperlipidemia, unspecified: Secondary | ICD-10-CM | POA: Diagnosis present

## 2021-04-02 DIAGNOSIS — I63412 Cerebral infarction due to embolism of left middle cerebral artery: Secondary | ICD-10-CM | POA: Diagnosis present

## 2021-04-02 DIAGNOSIS — I63039 Cerebral infarction due to thrombosis of unspecified carotid artery: Secondary | ICD-10-CM | POA: Diagnosis not present

## 2021-04-02 DIAGNOSIS — I7389 Other specified peripheral vascular diseases: Secondary | ICD-10-CM | POA: Diagnosis present

## 2021-04-02 DIAGNOSIS — R29708 NIHSS score 8: Secondary | ICD-10-CM | POA: Diagnosis present

## 2021-04-02 DIAGNOSIS — I69351 Hemiplegia and hemiparesis following cerebral infarction affecting right dominant side: Secondary | ICD-10-CM | POA: Diagnosis not present

## 2021-04-02 DIAGNOSIS — E1122 Type 2 diabetes mellitus with diabetic chronic kidney disease: Secondary | ICD-10-CM | POA: Diagnosis present

## 2021-04-02 DIAGNOSIS — E1165 Type 2 diabetes mellitus with hyperglycemia: Secondary | ICD-10-CM | POA: Diagnosis not present

## 2021-04-02 DIAGNOSIS — I6522 Occlusion and stenosis of left carotid artery: Secondary | ICD-10-CM | POA: Diagnosis not present

## 2021-04-02 DIAGNOSIS — E7800 Pure hypercholesterolemia, unspecified: Secondary | ICD-10-CM | POA: Diagnosis present

## 2021-04-02 DIAGNOSIS — E876 Hypokalemia: Secondary | ICD-10-CM | POA: Diagnosis not present

## 2021-04-02 DIAGNOSIS — J410 Simple chronic bronchitis: Secondary | ICD-10-CM | POA: Diagnosis not present

## 2021-04-02 DIAGNOSIS — I6523 Occlusion and stenosis of bilateral carotid arteries: Secondary | ICD-10-CM | POA: Diagnosis present

## 2021-04-02 DIAGNOSIS — E1169 Type 2 diabetes mellitus with other specified complication: Secondary | ICD-10-CM | POA: Diagnosis not present

## 2021-04-02 DIAGNOSIS — I63239 Cerebral infarction due to unspecified occlusion or stenosis of unspecified carotid arteries: Secondary | ICD-10-CM | POA: Diagnosis not present

## 2021-04-02 DIAGNOSIS — N1831 Chronic kidney disease, stage 3a: Secondary | ICD-10-CM | POA: Diagnosis present

## 2021-04-02 DIAGNOSIS — G4733 Obstructive sleep apnea (adult) (pediatric): Secondary | ICD-10-CM | POA: Diagnosis present

## 2021-04-02 DIAGNOSIS — I5032 Chronic diastolic (congestive) heart failure: Secondary | ICD-10-CM | POA: Diagnosis present

## 2021-04-02 DIAGNOSIS — G2581 Restless legs syndrome: Secondary | ICD-10-CM | POA: Diagnosis present

## 2021-04-02 DIAGNOSIS — W19XXXA Unspecified fall, initial encounter: Secondary | ICD-10-CM | POA: Diagnosis present

## 2021-04-02 DIAGNOSIS — G459 Transient cerebral ischemic attack, unspecified: Secondary | ICD-10-CM | POA: Diagnosis present

## 2021-04-02 DIAGNOSIS — E11319 Type 2 diabetes mellitus with unspecified diabetic retinopathy without macular edema: Secondary | ICD-10-CM | POA: Diagnosis present

## 2021-04-02 DIAGNOSIS — Z6832 Body mass index (BMI) 32.0-32.9, adult: Secondary | ICD-10-CM | POA: Diagnosis not present

## 2021-04-02 DIAGNOSIS — G8110 Spastic hemiplegia affecting unspecified side: Secondary | ICD-10-CM | POA: Diagnosis not present

## 2021-04-02 DIAGNOSIS — Z66 Do not resuscitate: Secondary | ICD-10-CM | POA: Diagnosis present

## 2021-04-02 DIAGNOSIS — K5901 Slow transit constipation: Secondary | ICD-10-CM | POA: Diagnosis not present

## 2021-04-02 DIAGNOSIS — I63512 Cerebral infarction due to unspecified occlusion or stenosis of left middle cerebral artery: Secondary | ICD-10-CM | POA: Diagnosis not present

## 2021-04-02 DIAGNOSIS — I6623 Occlusion and stenosis of bilateral posterior cerebral arteries: Secondary | ICD-10-CM | POA: Diagnosis not present

## 2021-04-02 DIAGNOSIS — I130 Hypertensive heart and chronic kidney disease with heart failure and stage 1 through stage 4 chronic kidney disease, or unspecified chronic kidney disease: Secondary | ICD-10-CM | POA: Diagnosis present

## 2021-04-02 DIAGNOSIS — R339 Retention of urine, unspecified: Secondary | ICD-10-CM | POA: Diagnosis not present

## 2021-04-02 LAB — LIPID PANEL
Cholesterol: 160 mg/dL (ref 0–200)
HDL: 65 mg/dL (ref >40)
LDL Cholesterol: 78 mg/dL (ref 0–99)
Total CHOL/HDL Ratio: 2.5 RATIO
Triglycerides: 86 mg/dL (ref <150)
VLDL: 17 mg/dL (ref 0–40)

## 2021-04-02 LAB — CBG MONITORING, ED
Glucose-Capillary: 132 mg/dL — ABNORMAL HIGH (ref 70–99)
Glucose-Capillary: 104 mg/dL — ABNORMAL HIGH (ref 70–99)
Glucose-Capillary: 123 mg/dL — ABNORMAL HIGH (ref 70–99)
Glucose-Capillary: 116 mg/dL — ABNORMAL HIGH (ref 70–99)
Glucose-Capillary: 119 mg/dL — ABNORMAL HIGH (ref 70–99)
Glucose-Capillary: 208 mg/dL — ABNORMAL HIGH (ref 70–99)

## 2021-04-02 LAB — SARS CORONAVIRUS 2 (TAT 6-24 HRS): SARS Coronavirus 2: NEGATIVE

## 2021-04-02 LAB — MAGNESIUM: Magnesium: 2 mg/dL (ref 1.7–2.4)

## 2021-04-02 LAB — HEMOGLOBIN A1C
Hgb A1c MFr Bld: 6.5 % — ABNORMAL HIGH (ref 4.8–5.6)
Mean Plasma Glucose: 139.85 mg/dL

## 2021-04-02 LAB — PHOSPHORUS: Phosphorus: 3.1 mg/dL (ref 2.5–4.6)

## 2021-04-02 LAB — TSH: TSH: 3.3 u[IU]/mL (ref 0.350–4.500)

## 2021-04-02 MED ORDER — SENNOSIDES-DOCUSATE SODIUM 8.6-50 MG PO TABS: 1.0000 | ORAL_TABLET | Freq: Every evening | ORAL | Status: DC | PRN

## 2021-04-02 MED ORDER — ATORVASTATIN CALCIUM 80 MG PO TABS
80.0000 mg | ORAL_TABLET | Freq: Every day | ORAL | Status: DC
  Administered 2021-04-02 – 2021-04-13 (×11): 80 mg via ORAL
  Filled 2021-04-02 (×11): qty 1

## 2021-04-02 MED ORDER — ASPIRIN EC 81 MG PO TBEC
81.0000 mg | DELAYED_RELEASE_TABLET | Freq: Every day | ORAL | Status: DC
  Administered 2021-04-02 – 2021-04-07 (×6): 81 mg via ORAL
  Filled 2021-04-02 (×7): qty 1

## 2021-04-02 MED ORDER — POTASSIUM CHLORIDE 10 MEQ/100ML IV SOLN
10.0000 meq | INTRAVENOUS | Status: AC
  Administered 2021-04-02 (×2): 10 meq via INTRAVENOUS
  Filled 2021-04-02 (×2): qty 100

## 2021-04-02 MED ORDER — ACETAMINOPHEN 650 MG RE SUPP: 650.0000 mg | RECTAL | Status: DC | PRN

## 2021-04-02 MED ORDER — IOHEXOL 350 MG/ML SOLN
100.0000 mL | Freq: Once | INTRAVENOUS | Status: AC | PRN
  Administered 2021-04-02: 100 mL via INTRAVENOUS

## 2021-04-02 MED ORDER — INSULIN ASPART 100 UNIT/ML IJ SOLN
0.0000 [IU] | INTRAMUSCULAR | Status: DC
  Administered 2021-04-02: 1 [IU] via SUBCUTANEOUS
  Administered 2021-04-02: 3 [IU] via SUBCUTANEOUS
  Administered 2021-04-03: 1 [IU] via SUBCUTANEOUS
  Administered 2021-04-04: 2 [IU] via SUBCUTANEOUS
  Administered 2021-04-05: 1 [IU] via SUBCUTANEOUS
  Administered 2021-04-05: 3 [IU] via SUBCUTANEOUS
  Administered 2021-04-05 (×2): 1 [IU] via SUBCUTANEOUS
  Administered 2021-04-06 – 2021-04-07 (×2): 2 [IU] via SUBCUTANEOUS
  Administered 2021-04-07: 1 [IU] via SUBCUTANEOUS
  Administered 2021-04-07: 2 [IU] via SUBCUTANEOUS
  Administered 2021-04-07: 1 [IU] via SUBCUTANEOUS

## 2021-04-02 MED ORDER — STROKE: EARLY STAGES OF RECOVERY BOOK
Freq: Once | Status: AC
  Administered 2021-04-11: 1
  Filled 2021-04-02 (×2): qty 1

## 2021-04-02 MED ORDER — SODIUM CHLORIDE 0.9 % IV SOLN
INTRAVENOUS | Status: DC
  Administered 2021-04-02: 999 mL via INTRAVENOUS

## 2021-04-02 MED ORDER — ACETAMINOPHEN 160 MG/5ML PO SOLN: 650.0000 mg | ORAL | Status: DC | PRN

## 2021-04-02 MED ORDER — ACETAMINOPHEN 325 MG PO TABS: 650.0000 mg | ORAL_TABLET | ORAL | Status: DC | PRN

## 2021-04-02 MED ORDER — CLOPIDOGREL BISULFATE 75 MG PO TABS
75.0000 mg | ORAL_TABLET | Freq: Every day | ORAL | Status: DC
  Administered 2021-04-02 – 2021-04-13 (×12): 75 mg via ORAL
  Filled 2021-04-02 (×13): qty 1

## 2021-04-02 NOTE — H&P (Signed)
Jade [name]:[id number] DOB: [birth year] DOA: 04/01/2021     PCP: System, Provider Not In   Outpatient Specialists:      Patient arrived to ER on 04/01/21 at 2007 Referred by Attending Veryl Speak, MD   Patient coming from: home Lives  With family    Chief Complaint:   Chief Complaint  Patient presents with   Numbness    HPI: Jade [name] is a 65 y.o. female with medical history significant of CVA, COPD, CKD, HLD, HTN, OSA, GERD DM2    Presented with right hand weakness since today 2:30 PM She was try to get off the toilet and try to get grab onto the bar but could not secondary to weakness of her right hand. Recent stroke few weeks ago affected her vision.  She did have some mild residual right hand deficits as well but current has been much worse.  No visual changes currently no headaches no chest pain or shortness of breath. Reports she has been taking her home meds    Recent admit for CVA:  MRI brain with acute left thalamic, anterior left frontal and possible right frontal and multiple remote lacunar infarcts.  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  CTA head and neck with 75% left proximal ICA stenosis.  TTE with normal LVEF and G1 DD. A1c 6.4%.  LDL 184. Neurology rec;  Plavix and aspirin for 3 weeks followed by Plavix alone. Continue high intensity statin Patient initially was recommended for SNF by therapy but ultimately chose to go home with [facility] Left ICA stenosis: CTA head and neck with 75% left proximal ICA stenosis.  It seems she was previously scheduled for CEA in 03/2018 but she canceled it.  Regardless, she is currently not symptomatic from this. -Patient to follow up with vascular surgery  History of multiple sclerosis? Per PCP note, confirmed via MRI and CSF 15 years ago. Has  NOt been vaccinated against COVID     Initial COVID TEST  in house  PCR testing  Pending  Lab Results  Component Value Date   [place] 03/12/2021    [place] NEGATIVE 03/05/2021     Regarding pertinent Chronic problems:     Hyperlipidemia -  on statins Lipitor Lipid Panel     Component Value Date/Time   CHOL 271 (H) 03/06/2021 0445   TRIG 141 03/06/2021 0445   HDL 59 03/06/2021 0445   CHOLHDL 4.6 03/06/2021 0445   VLDL 28 03/06/2021 0445   LDLCALC 184 (H) 03/06/2021 0445   LDLDIRECT 164.7 08/24/2013 0848     HTN on Norvasc HCTZ   chronic CHF diastolic/  - last echo 8/91/6945 grade 1 diastolic CHF     DM 2 -  Lab Results  Component Value Date   HGBA1C 6.4 (H) 03/06/2021   diet controlled   obesity-   BMI Readings from Last 1 Encounters:  04/01/21 32.12 kg/m      Hx of CVA -  with/ residual deficits on Aspirin 81 mg,  Plavix  CKD stage IIIa- baseline Cr 1.2 Estimated Creatinine Clearance: 54.1 mL/min (by C-G formula based on SCr of 0.96 mg/dL).  Lab Results  Component Value Date   CREATININE 0.96 04/01/2021   CREATININE 1.18 (H) 03/12/2021   CREATININE 1.20 (H) 03/09/2021     While in ER: MRI showed new CVA neurology has been consulted     ED Triage Vitals [04/01/21 2010]  Enc Vitals Group  BP (!) 170/73     Pulse Rate 87     Resp (!) 23     Temp 98.3 F (36.8 C)     Temp Source Oral     SpO2 98 %     Weight 170 lb (77.1 kg)     Height 5\' 1"  (1.549 m)     Head Circumference      Peak Flow      Pain Score 0     Pain Loc      Pain Edu?      Excl. in GC?       _________________________________________ Significant initial  Findings: Abnormal Labs Reviewed  DIFFERENTIAL - Abnormal; Notable for the following components:      Result Value   Neutro Abs 7.8 (*)    All other components within normal limits  COMPREHENSIVE METABOLIC PANEL - Abnormal; Notable for the following components:   Potassium 3.3 (*)    Glucose, Bld 168 (*)    All other components within normal limits  URINALYSIS, ROUTINE W REFLEX MICROSCOPIC - Abnormal; Notable for the following components:   Color, Urine  STRAW (*)    All other components within normal limits  CBG MONITORING, ED - Abnormal; Notable for the following components:   Glucose-Capillary 171 (*)    All other components within normal limits   ____________________________________________ Ordered CT HEAD   NON acute  CXR -  NON acute    MRI - Small acute infarct at the left frontoparietal junction. Additional small focus of acute ischemia adjacent to the frontal horn of the left lateral ventricle with petechial hemorrhage. 2. Multiple old small vessel infarcts and findings of chronic ischemic microangiopathy.   ECG: Ordered Personally reviewed by me showing: HR :  81 Rhythm:  NSR w LVH  no evidence of ischemic changes QTC 471 _______   The recent clinical data is shown below. Vitals:   04/01/21 2010 04/01/21 2249  BP: (!) 170/73 (!) 170/78  Pulse: 87 83  Resp: (!) 23 17  Temp: 98.3 F (36.8 C)   TempSrc: Oral   SpO2: 98% 97%  Weight: 77.1 kg   Height: 5\' 1"  (1.549 m)        WBC     Component Value Date/Time   WBC 10.5 04/01/2021 2022   LYMPHSABS 1.7 04/01/2021 2022   MONOABS 0.8 04/01/2021 2022   EOSABS 0.1 04/01/2021 2022   BASOSABS 0.1 04/01/2021 2022      UA  no evidence of UTI     Urine analysis:    Component Value Date/Time   COLORURINE STRAW (A) 04/01/2021 2104   APPEARANCEUR CLEAR 04/01/2021 2104   LABSPEC 1.011 04/01/2021 2104   PHURINE 6.0 04/01/2021 2104   GLUCOSEU NEGATIVE 04/01/2021 2104   GLUCOSEU NEGATIVE 03/08/2015 0855   HGBUR NEGATIVE 04/01/2021 2104   BILIRUBINUR NEGATIVE 04/01/2021 2104   KETONESUR NEGATIVE 04/01/2021 2104   PROTEINUR NEGATIVE 04/01/2021 2104   UROBILINOGEN 0.2 03/08/2015 0855   NITRITE NEGATIVE 04/01/2021 2104   LEUKOCYTESUR NEGATIVE 04/01/2021 2104     _______________________________________________________ ER Provider Called:  Neurology    Dr.Kirkpatrick They Recommend admit to medicine    SEEN in  ER _______________________________________________ Hospitalist was called for admission for CVA  The following Work up has been ordered so far:  Orders Placed This Encounter  Procedures   CT HEAD WO CONTRAST   MR BRAIN WO CONTRAST   Ethanol   Protime-INR   APTT   CBC  Differential   Comprehensive metabolic panel   Urine rapid drug screen (hosp performed)   Urinalysis, Routine w reflex microscopic   Diet NPO time specified   Vital signs   Cardiac Monitoring   Modified Stroke Scale (mNIHSS) Document mNIHSS assessment every 2 hours for a total of 12 hours   Stroke swallow screen   Initiate Carrier Fluid Protocol   If O2 sat If O2 Sat < 94%, administer O2 at 2 liters/minute via nasal cannula.   Consult to hospitalist   Pulse oximetry, continuous   CBG monitoring, ED   ED EKG   EKG 12-Lead   Saline lock IV      Following Medications were ordered in ER: Medications  LORazepam (ATIVAN) injection 1 mg (1 mg Intravenous Given 04/01/21 2251)        Consult Orders  (From admission, onward)           Start     Ordered   04/02/21 0010  Consult to hospitalist  Paged by Jerene Pitch  Once       Provider:  (Not yet assigned)  Question Answer Comment  Place call to: [facility]   Reason for Consult Admit      04/02/21 0009              OTHER Significant initial  Findings:  labs showing:    Recent Labs  Lab 04/01/21 2022  NA 139  K 3.3*  CO2 22  GLUCOSE 168*  BUN 21  CREATININE 0.96  CALCIUM 10.0    Cr   stable,   Lab Results  Component Value Date   CREATININE 0.96 04/01/2021   CREATININE 1.18 (H) 03/12/2021   CREATININE 1.20 (H) 03/09/2021    Recent Labs  Lab 04/01/21 2022  AST 23  ALT 17  ALKPHOS 75  BILITOT 1.0  PROT 7.1  ALBUMIN 4.0   Lab Results  Component Value Date   CALCIUM 10.0 04/01/2021   PHOS 3.3 03/09/2021          Plt: Lab Results  Component Value Date   PLT 365 04/01/2021       COVID-19 Labs  No results for  input(s): DDIMER, FERRITIN, LDH, CRP in the last 72 hours.  Lab Results  Component Value Date   SARSCOV2NAA NEGATIVE 03/12/2021   [place] NEGATIVE 03/05/2021         Recent Labs  Lab 04/01/21 2022  WBC 10.5  NEUTROABS 7.8*  HGB 15.0  HCT 43.2  MCV 93.7  PLT 365    HG/HCT  stable,      Component Value Date/Time   HGB 15.0 04/01/2021 2022   HCT 43.2 04/01/2021 2022   MCV 93.7 04/01/2021 2022     No results for input(s): LIPASE, AMYLASE in the last 168 hours. No results for input(s): AMMONIA in the last 168 hours.    DM  labs:  HbA1C: Recent Labs    03/06/21 0445  HGBA1C 6.4*       CBG (last 3)  Recent Labs    04/01/21 2038  GLUCAP 171*          Cultures: No results found for: SDES, SPECREQUEST, CULT, REPTSTATUS   Radiological Exams on Admission: CT HEAD WO CONTRAST  Result Date: 04/01/2021 CLINICAL DATA:  Right side numbness EXAM: CT HEAD WITHOUT CONTRAST TECHNIQUE: Contiguous axial images were obtained from the base of the skull through the vertex without intravenous contrast. COMPARISON:  03/06/2021 FINDINGS: Brain: There is atrophy and chronic small vessel  disease changes. No acute intracranial abnormality. Specifically, no hemorrhage, hydrocephalus, mass lesion, acute infarction, or significant intracranial injury. Vascular: No hyperdense vessel or unexpected calcification. Skull: No acute calvarial abnormality. Sinuses/Orbits: No acute findings Other: None IMPRESSION: Atrophy, chronic microvascular disease. No acute intracranial abnormality. Electronically Signed   By: Rolm Baptise M.D.   On: 04/01/2021 21:50   MR BRAIN WO CONTRAST  Result Date: 04/01/2021 CLINICAL DATA:  Right-sided weakness EXAM: MRI HEAD WITHOUT CONTRAST TECHNIQUE: Multiplanar, multiecho pulse sequences of the brain and surrounding structures were obtained without intravenous contrast. COMPARISON:  None. FINDINGS: Brain: Small acute infarct at the left frontoparietal junction.  Additional small focus acute ischemia adjacent to the frontal horn of the left lateral ventricle with associated magnetic susceptibility effect, possibly petechial hemorrhage. Right middle cranial fossa arachnoid cyst. There is multifocal hyperintense T2-weighted signal within the white matter. Generalized volume loss without a clear lobar predilection. Multiple old small vessel infarcts of the gray nuclei. Vascular: Major flow voids are preserved. Skull and upper cervical spine: Normal calvarium and skull base. Visualized upper cervical spine and soft tissues are normal. Sinuses/Orbits:No paranasal sinus fluid levels or advanced mucosal thickening. No mastoid or middle ear effusion. Normal orbits. IMPRESSION: 1. Small acute infarct at the left frontoparietal junction. Additional small focus of acute ischemia adjacent to the frontal horn of the left lateral ventricle with petechial hemorrhage. 2. Multiple old small vessel infarcts and findings of chronic ischemic microangiopathy. Electronically Signed   By: Ulyses Jarred M.D.   On: 04/01/2021 23:43   _______________________________________________________________________________________________________ Latest  Blood pressure (!) 170/78, pulse 83, temperature 98.3 F (36.8 C), temperature source Oral, resp. rate 17, height $RemoveBe'5\' 1"'LisOuMEpI$  (1.549 m), weight 77.1 kg, SpO2 97 %.   Review of Systems:    Pertinent positives include:  localizing neurological complaints,  Constitutional:  No weight loss, night sweats, Fevers, chills, fatigue, weight loss  HEENT:  No headaches, Difficulty swallowing,Tooth/dental problems,Sore throat,  No sneezing, itching, ear ache, nasal congestion, post nasal drip,  Cardio-vascular:  No chest pain, Orthopnea, PND, anasarca, dizziness, palpitations.no Bilateral lower extremity swelling  GI:  No heartburn, indigestion, abdominal pain, nausea, vomiting, diarrhea, change in bowel habits, loss of appetite, melena, blood in stool,  hematemesis Resp:  no shortness of breath at rest. No dyspnea on exertion, No excess mucus, no productive cough, No non-productive cough, No coughing up of blood.No change in color of mucus.No wheezing. Skin:  no rash or lesions. No jaundice GU:  no dysuria, change in color of urine, no urgency or frequency. No straining to urinate.  No flank pain.  Musculoskeletal:  No joint pain or no joint swelling. No decreased range of motion. No back pain.  Psych:  No change in mood or affect. No depression or anxiety. No memory loss.  Neuro: no  no tingling, no weakness, no double vision, no gait abnormality, no slurred speech, no confusion  All systems reviewed and apart from [place] all are negative _______________________________________________________________________________________________ Past Medical History:   Past Medical History:  Diagnosis Date   Arthritis    "back, knees, some in my left hip" (03/21/2018)   Chronic kidney disease    "was told I had 25% kidney function in early 2012" (03/21/2018)   COPD (chronic obstructive pulmonary disease) ([place])    CVA (cerebral vascular accident) ([place]) 03/21/2018   "numb all over; head to toe" (03/21/2018)   Fibromyalgia    History of gout    Hyperlipidemia    Hypertension    Migraine    "  used to get them monthly during menopause" (03/21/2018)   Neuromuscular disorder (HCC)    OSA (obstructive sleep apnea)    "don't tolerate the machine" (03/21/2018)   Pneumonia ~ 2007   Type 2 diabetes, diet controlled (HCC)       Past Surgical History:  Procedure Laterality Date   HERNIA REPAIR     LAPAROSCOPIC CHOLECYSTECTOMY  06/2007   "no UHR w/this" (03/21/2018)   TONSILLECTOMY     UMBILICAL HERNIA REPAIR  2009    Social History:  Ambulatory cane, walker       reports that she quit smoking about 33 years ago. Her smoking use included cigarettes. She has a 20.00 pack-year smoking history. She has never used smokeless tobacco. She reports  previous alcohol use. She reports previous drug use. Drug: Marijuana.     Family History:   Family History  Problem Relation Age of Onset   Heart disease Father    Leukemia Father    Alcohol abuse Son    Other Mother    Cancer Neg Hx    Diabetes Neg Hx    Hearing loss Neg Hx    Hyperlipidemia Neg Hx    Hypertension Neg Hx    Kidney disease Neg Hx    Stroke Neg Hx    ______________________________________________________________________________________________ Allergies: Allergies  Allergen Reactions   Nickel Dermatitis and Rash     Prior to Admission medications   Medication Sig Start Date End Date Taking? Authorizing Provider  amLODipine (NORVASC) 10 MG tablet Take 1 tablet (10 mg total) by mouth daily. 03/15/21 04/14/21 Yes Jerald Kief, MD  aspirin 81 MG EC tablet Take 1 tablet (81 mg total) by mouth daily for 21 days. Swallow whole. 03/15/21 04/05/21 Yes Jerald Kief, MD  atorvastatin (LIPITOR) 80 MG tablet Take 1 tablet (80 mg total) by mouth daily. 03/15/21 04/14/21 Yes Jerald Kief, MD  b complex vitamins capsule Take 1 capsule by mouth daily.   Yes [provider]  Cholecalciferol (VITAMIN D-3) 5000 UNITS TABS Take 5,000 Units by mouth daily.    Yes [provider]  clopidogrel (PLAVIX) 75 MG tablet Take 1 tablet (75 mg total) by mouth daily. 03/15/21 04/14/21 Yes Jerald Kief, MD  Coenzyme Q10 (COQ-10) 100 MG CAPS Take 100 mg by mouth at bedtime.   Yes [provider]  hydrochlorothiazide (MICROZIDE) 12.5 MG capsule Take 1 capsule (12.5 mg total) by mouth daily. 02/17/21 02/17/22 Yes Elson Areas, PA-C  magnesium gluconate (MAGONATE) 500 MG tablet Take 1,000 mg by mouth at bedtime.   Yes [provider]  Menaquinone-7 (VITAMIN K2 PO) Take 1 tablet by mouth daily.   Yes [provider]  Omega-3 Fatty Acids (FISH OIL) 600 MG CAPS Take 600 mg by mouth daily.   Yes [provider]  Blood Glucose Calibration  (ACCU-CHEK SMARTVIEW CONTROL) LIQD Test up to TID dx:E11.40 09/13/14   Etta Grandchild, MD  Blood Glucose Monitoring Suppl (ACCU-CHEK NANO SMARTVIEW) W/DEVICE KIT Test up to TID dx:E11.40 09/13/14   Etta Grandchild, MD  glucose blood (ACCU-CHEK SMARTVIEW) test strip Test up to TID dx:E11.40 09/13/14   Etta Grandchild, MD  Lancets Misc. (ACCU-CHEK FASTCLIX LANCET) KIT Test up to TID dx:E11.40 09/13/14   Etta Grandchild, MD    ___________________________________________________________________________________________________ Physical Exam: Vitals with BMI 04/01/2021 04/01/2021 03/14/2021  Height - 5\' 1"  -  Weight - 170 lbs -  BMI - 32.14 -  Systolic 170 170  Diastolic 78 73 75  Pulse 83 87 80     1. General:  in No  Acute distress   Chronically ill   -appearing 2. Psychological: Alert and  Oriented 3. Head/ENT:   Dry Mucous Membranes                          Head Non traumatic, neck supple                         Poor Dentition 4. SKIN:  decreased Skin turgor,  Skin clean Dry and intact no rash 5. Heart: Regular rate and rhythm no Murmur, no Rub or gallop 6. Lungs:   no wheezes or crackles   7. Abdomen: Soft,  non-tender, Non distended   obese  bowel sounds present 8. Lower extremities: no clubbing, cyanosis, no  edema 9. Neurologically right side weakness noted  otherwise intact 10. MSK: Normal range of motion    Chart has been reviewed  ______________________________________________________________________________________________  Assessment/Plan 65 y.o. female with medical history significant of CVA, COPD, CKD, HLD, HTN, OSA, GERD DM2      Admitted for CVA  Present on Admission:  Acute CVA (cerebrovascular accident) ([place]) -  - will admit based on TIA/CVA protocol,        Monitor on Tele       MRA/MRI   Resulted - showing acute ischemic CVA        CTA  2 wks ago showed Left ICA stenosis: CTA head and neck with 75% left proximal ICA stenosis.    Pt on Asa 81 and plavix +  statin Will need Vascular surgery consult in AM         Echo done 2 wks ago showed G1DD no etiology for CVA       obtain  ECG,   Lipid panel, TSH.        Order PT/OT evaluation.        keep nothing by mouth until passes swallow eval                Allow permissive Hypertension keep BP <220/120        Neurology consulted Have seen pt in ER    Pure hypercholesterolemia chronic stable continue Lipitor    Obesity, Class III, BMI 40-49.9 (morbid obesity) ([place]) -will need outpatient follow-up with nutrition   Essential hypertension, benign -allow permissive hypertension   Diabetes mellitus type 2 with neurological manifestations ([place]) -  - Order Sensitive  SSI    -  check TSH and HgA1C  - Hold by mouth medications    Carotid artery stenosis with cerebral infarction [facility]) we will need vascular consult in a.m. for now continue baby aspirin Plavix and statin   COPD (chronic obstructive pulmonary disease) (HCC) Chronic stable albuterol as needed  Other plan as per orders.  DVT prophylaxis:  SCD        Code Status:    Code Status: DNR  as per patient  I had personally discussed CODE STATUS with patient      Family Communication:   Family not at  Bedside    Disposition Plan:     likely will need placement for rehabilitation   Following barriers for discharge:                            Electrolytes corrected  Will likely need home health, home O2, set up                           Will need consultants to evaluate patient prior to discharge                   Would benefit from PT/OT eval prior to DC  Ordered                     Consults called: NEurology is aware, please consult vascular surgery in AM   Admission status:  ED Disposition     ED Disposition  [place]: [place] [[id number]]  Level of Care: Telemetry Medical [104]  May place  patient in observation at [facility] or [place] if equivalent level of care is available:: No  Covid Evaluation: Asymptomatic Screening Protocol (No Symptoms)  Diagnosis: TIA (transient ischemic attack) [433295]  Admitting Physician: Toy Baker [[id number]]  Attending Physician: Toy Baker [[id number]]           Obs     Level of care   tele indefinitely please discontinue once patient no longer qualifies COVID-19 Labs     Precautions: admitted as asymptomatic screening protocol t   PPE: Used by the provider:   N95  eye Goggles,  Gloves     Shanon Seawright 04/02/2021, 2:00 AM    [facility]     after 2 AM please page floor coverage PA If 7AM-7PM, please contact the day team taking care of the patient using [email or web address]   Patient was evaluated in the context of the global COVID-19 pandemic, which necessitated consideration that the patient might be at risk for infection with the SARS-CoV-2 virus that causes COVID-19. Institutional protocols and algorithms that pertain to the evaluation of patients at risk for COVID-19 are in a state of rapid change based on information released by regulatory bodies including the CDC and federal and state organizations. These policies and algorithms were followed during the patient's care.

## 2021-04-02 NOTE — Consult Note (Addendum)
[facility] [place]  ASSESSMENT / PLAN: Jade [name] is a 65 y.o. female with symptomatic left 80% carotid artery stenosis causing L MCA stroke.   The patient's carotid artery stenosis merits consideration of revascularization to reduce the risk of future stroke because of carotid stenosis >50% with symptoms of TIA/CVA attributable to carotid lesion. I quoted the patient risk reduction from intervention of ~17% for symptomatic stenosis based on data from the NASCET trial.  I think the best option for the patient is a L TCAR. She has  short, wide neck. The carotid lesion ends at about the level of C2/C3 disc space. I think she would have an increased risk of cranial nerve injury from open repair.    The patient should continue best medical therapy for carotid artery stenosis including: Complete cessation from all tobacco products. Blood glucose control with goal A1c < 7%. Blood pressure control with goal blood pressure < 140/90 mmHg. Lipid reduction therapy with goal LDL-C <100 mg/dL (<70 if symptomatic from carotid artery stenosis).  Aspirin 81mg PO QD.  Clopidogrel 75mg PO QD. Atorvastatin 40-80mg PO QD (or other "high intensity" statin therapy).  Plan L TCAR 04/06/21.     N. , MD [facility] [place] Office Phone Number: [phone] 04/02/2021 9:40 AM  CHIEF COMPLAINT: L CVA, L ICA stenosis  HISTORY OF PRESENT ILLNESS: Jade [name] is a 65 y.o. female who presented to the emergency department with right arm and leg weakness.  Symptoms started around noon yesterday 04/01/2021.  She continues to have heavy deficit in her right arm and wrist.  She believes her right leg weakness has significantly improved.  She experienced a TIA with speech changes in 2019 and was considered for carotid revascularization at the time however this was canceled.  She denies any changes in vision or slurring speech currently.  Work-up included  CTA which demonstrates greater than 80% stenosis of left internal carotid artery.  Right internal carotid artery stenosis is estimated to be less than 40%.  She is on aspirin, Plavix, statin daily.  VASCULAR SURGICAL HISTORY: none  VASCULAR RISK FACTORS: Positive history of cerebrovascular disease / stroke / transient ischemic attack. N/A history of coronary artery disease Positive history of diabetes mellitus. Last A1c 6.5. Positive history of hypertension. No history of chronic kidney disease. Last GFR 60.  Positive history of chronic obstructive pulmonary disease  AMBULATORY STATUS: Ambulatory within the community with limits  Past Medical History:  Diagnosis Date   Arthritis    "back, knees, some in my left hip" (03/21/2018)   Chronic kidney disease    "was told I had 25% kidney function in early 2012" (03/21/2018)   COPD (chronic obstructive pulmonary disease) (HCC)    CVA (cerebral vascular accident) (HCC) 03/21/2018   "numb all over; head to toe" (03/21/2018)   Fibromyalgia    History of gout    Hyperlipidemia    Hypertension    Migraine    "used to get them monthly during menopause" (03/21/2018)   Neuromuscular disorder (HCC)    OSA (obstructive sleep apnea)    "don't tolerate the machine" (03/21/2018)   Pneumonia ~ 2007   Type 2 diabetes, diet controlled (HCC)     Past Surgical History:  Procedure Laterality Date   HERNIA REPAIR     LAPAROSCOPIC CHOLECYSTECTOMY  06/2007   "no UHR w/this" (03/21/2018)   TONSILLECTOMY     UMBILICAL HERNIA REPAIR  2009    Family   History  Problem Relation Age of Onset   Heart disease Father    Leukemia Father    Alcohol abuse Son    Other Mother    Cancer Neg Hx    Diabetes Neg Hx    Hearing loss Neg Hx    Hyperlipidemia Neg Hx    Hypertension Neg Hx    Kidney disease Neg Hx    Stroke Neg Hx     Social History   Socioeconomic History   Marital status: Married    Spouse name: Jose   Number of children: 1   Years of  education: Not on file   Highest education level: Not on file  Occupational History   Occupation: Disability  Tobacco Use   Smoking status: Former    Packs/day: 1.25    Years: 16.00    Pack years: 20.00    Types: Cigarettes    Quit date: 08/28/1987    Years since quitting: 33.6   Smokeless tobacco: Never  Vaping Use   Vaping Use: Never used  Substance and Sexual Activity   Alcohol use: Not Currently   Drug use: Not Currently    Types: Marijuana   Sexual activity: Not Currently    Birth control/protection: Post-menopausal  Other Topics Concern   Not on file  Social History Narrative   Not on file   Social Determinants of Health   Financial Resource Strain: Not on file  Food Insecurity: Not on file  Transportation Needs: Not on file  Physical Activity: Not on file  Stress: Not on file  Social Connections: Not on file  Intimate Partner Violence: Not on file    Allergies  Allergen Reactions   Nickel Dermatitis and Rash    Current Facility-Administered Medications  Medication Dose Route Frequency Provider Last Rate Last Admin    stroke: mapping our early stages of recovery book   Does not apply Once Doutova, Anastassia, MD       0.9 %  sodium chloride infusion   Intravenous Continuous Doutova, Anastassia, MD 75 mL/hr at 04/02/21 0426 999 mL at 04/02/21 0426   acetaminophen (TYLENOL) tablet 650 mg  650 mg Oral Q4H PRN Doutova, Anastassia, MD       Or   acetaminophen (TYLENOL) 160 MG/5ML solution 650 mg  650 mg Per Tube Q4H PRN Doutova, Anastassia, MD       Or   acetaminophen (TYLENOL) suppository 650 mg  650 mg Rectal Q4H PRN Doutova, Anastassia, MD       aspirin EC tablet 81 mg  81 mg Oral Daily Doutova, Anastassia, MD   81 mg at 04/02/21 0936   atorvastatin (LIPITOR) tablet 80 mg  80 mg Oral Daily Doutova, Anastassia, MD   80 mg at 04/02/21 0936   clopidogrel (PLAVIX) tablet 75 mg  75 mg Oral Daily Doutova, Anastassia, MD   75 mg at 04/02/21 0936   insulin aspart  (novoLOG) injection 0-9 Units  0-9 Units Subcutaneous Q4H Doutova, Anastassia, MD   1 Units at 04/02/21 0110   senna-docusate (Senokot-S) tablet 1 tablet  1 tablet Oral QHS PRN Doutova, Anastassia, MD       Current Outpatient Medications  Medication Sig Dispense Refill   amLODipine (NORVASC) 10 MG tablet Take 1 tablet (10 mg total) by mouth daily. 30 tablet 0   aspirin 81 MG EC tablet Take 1 tablet (81 mg total) by mouth daily for 21 days. Swallow whole. 21 tablet 0   atorvastatin (LIPITOR) 80 MG tablet Take 1   tablet (80 mg total) by mouth daily. 30 tablet 0   b complex vitamins capsule Take 1 capsule by mouth daily.     Cholecalciferol (VITAMIN D-3) 5000 UNITS TABS Take 5,000 Units by mouth daily.      clopidogrel (PLAVIX) 75 MG tablet Take 1 tablet (75 mg total) by mouth daily. 30 tablet 0   Coenzyme Q10 (COQ-10) 100 MG CAPS Take 100 mg by mouth at bedtime.     hydrochlorothiazide (MICROZIDE) 12.5 MG capsule Take 1 capsule (12.5 mg total) by mouth daily. 30 capsule 0   magnesium gluconate (MAGONATE) 500 MG tablet Take 1,000 mg by mouth at bedtime.     Menaquinone-7 (VITAMIN K2 PO) Take 1 tablet by mouth daily.     Omega-3 Fatty Acids (FISH OIL) 600 MG CAPS Take 600 mg by mouth daily.     Blood Glucose Calibration (ACCU-CHEK SMARTVIEW CONTROL) LIQD Test up to TID dx:E11.40 3 each 3   Blood Glucose Monitoring Suppl (ACCU-CHEK NANO SMARTVIEW) W/DEVICE KIT Test up to TID dx:E11.40 1 kit 3   glucose blood (ACCU-CHEK SMARTVIEW) test strip Test up to TID dx:E11.40 300 each 3   Lancets Misc. (ACCU-CHEK FASTCLIX LANCET) KIT Test up to TID dx:E11.40 1 kit 2    REVIEW OF SYSTEMS:  [X] denotes positive finding, [ ] denotes negative finding Cardiac  Comments:  Chest pain or chest pressure:    Shortness of breath upon exertion:    Short of breath when lying flat:    Irregular heart rhythm:        Vascular    Pain in calf, thigh, or hip brought on by ambulation:    Pain in feet at night that wakes  you up from your sleep:     Blood clot in your veins:    Leg swelling:         Pulmonary    Oxygen at home:    Productive cough:     Wheezing:         Neurologic    Sudden weakness in arms or legs:     Sudden numbness in arms or legs:     Sudden onset of difficulty speaking or slurred speech:    Temporary loss of vision in one eye:     Problems with dizziness:         Gastrointestinal    Blood in stool:     Vomited blood:         Genitourinary    Burning when urinating:     Blood in urine:        Psychiatric    Major depression:         Hematologic    Bleeding problems:    Problems with blood clotting too easily:        Skin    Rashes or ulcers:        Constitutional    Fever or chills:      PHYSICAL EXAM Vitals:   04/02/21 0618 04/02/21 0700 04/02/21 0800 04/02/21 0900  BP: (!) 165/103 (!) 172/86 (!) 190/93 (!) 201/80  Pulse: 82 72 62 61  Resp: _0 Temp:      TempSrc:      SpO2: 99% 95% 99% 95%  Weight:      Height:        Constitutional: well appearing, obese Neurologic: heavy R arm motor deficit Psychiatric:  Mood and affect symmetric and appropriate. Cardiac: regular rate and rhythm.  Respiratory: unlabored.  Abdominal:  soft, non-tender, non-distended.  Peripheral vascular: feet are warm Extremity: negative edema. negative cyanosis. negative pallor.  Skin: negative gangrene. negative ulceration.   PERTINENT LABORATORY AND RADIOLOGIC DATA  Most recent CBC CBC Latest Ref Rng & Units 04/01/2021 03/12/2021 03/10/2021  WBC 4.0 - 10.5 K/uL 10.5 9.3 9.0  Hemoglobin 12.0 - 15.0 g/dL 15.0 14.5 14.3  Hematocrit 36.0 - 46.0 % 43.2 43.2 42.2  Platelets 150 - 400 K/uL 365 296 283     Most recent CMP CMP Latest Ref Rng & Units 04/01/2021 03/12/2021 03/09/2021  Glucose 70 - 99 mg/dL 168(H) 128(H) 134(H)  BUN 8 - 23 mg/dL 21 26(H) 21  Creatinine 0.44 - 1.00 mg/dL 0.96 1.18(H) 1.20(H)  Sodium 135 - 145 mmol/L 139 137 141  Potassium 3.5 - 5.1 mmol/L  3.3(L) 3.6 3.7  Chloride 98 - 111 mmol/L 106 104 107  CO2 22 - 32 mmol/L 22 24 25  Calcium 8.9 - 10.3 mg/dL 10.0 9.6 9.5  Total Protein 6.5 - 8.1 g/dL 7.1 6.5 -  Total Bilirubin 0.3 - 1.2 mg/dL 1.0 0.8 -  Alkaline Phos 38 - 126 U/L 75 65 -  AST 15 - 41 U/L 23 25 -  ALT 0 - 44 U/L 17 23 -    Renal function Estimated Creatinine Clearance: 54.1 mL/min (by C-G formula based on SCr of 0.96 mg/dL).  Hgb A1c MFr Bld (%)  Date Value  04/01/2021 6.5 (H)    LDL Cholesterol  Date Value Ref Range Status  04/02/2021 78 0 - 99 mg/dL Final    Comment:           Total Cholesterol/HDL:CHD Risk Coronary Heart Disease Risk Table                     Men   Women  1/2 Average Risk   3.4   3.3  Average Risk       5.0   4.4  2 X Average Risk   9.6   7.1  3 X Average Risk  23.4   11.0        Use the calculated Patient Ratio above and the CHD Risk Table to determine the patient's CHD Risk.        ATP III CLASSIFICATION (LDL):  <100     mg/dL   Optimal  100-129  mg/dL   Near or Above                    Optimal  130-159  mg/dL   Borderline  160-189  mg/dL   High  >190     mg/dL   Very High Performed at [facility], [address] [place], [place] [address]    Direct LDL  Date Value Ref Range Status  08/24/2013 164.7 mg/dL Final    Comment:    Optimal:  <100 mg/dLNear or Above Optimal:  100-129 mg/dLBorderline High:  130-159 mg/dLHigh:  160-189 mg/dLVery High:  >190 mg/dL     Vascular Imaging: CTA demonstrating greater than 80% stenosis of left ICA 40% stenosis right ICA     VASCULAR STAFF ADDENDUM: I have independently interviewed and examined the patient. I agree with the above.  L TCAR Thursday 04/06/21.   N. , MD [facility] [place] Office Phone Number: [phone] 04/03/2021 12:48 PM     

## 2021-04-02 NOTE — Evaluation (Signed)
Physical Therapy Evaluation Patient Details Name: Jade [name] MRN: [id number] DOB: [birth year] Today's Date: 04/02/2021   History of Present Illness  Pt is a 65 y.o. female who presented 04/01/21 with R-sided weakness. MRI reveals an embolic appearing stroke on the left, affecting a posterior MCA branch. Imaging revealed severe L ICA origin stenosis. Of note, a few weeks ago pt had stroke affecting her vision and R-hand strength, MRI then revealed acute L thalamic , anterior left frontal and possible right frontal and multiple remote lacunar infarcts. PMH: arthritis, CKD, COPD, CVA, fibromyalgia, gout, HTN, migraine, OSA, DM2   Clinical Impression  Pt presents with condition above and deficits mentioned below, see PT Problem List. PTA, she was living with her husband, who works during the day, in a house with 1 stair inside and 4 STE without rails. Pt was using a RW for functional mobility. Currently, pt displays increased R-sided weakness compared to baseline, apraxia/incoordination, balance deficits, and decreased activity tolerance that places her at high risk for falls. She is requiring maxAx1-2 for all bed mobility, transfers, standing and sitting balance, and to take a couple steps with UE support at this time. Pt would be unsafe to perform any functional mobility alone at home, thus recommend short-term rehab at a SNF prior to return home to maximize her safety and independence with all functional mobility. Will continue to follow acutely.    Follow Up Recommendations SNF;Supervision/Assistance - 24 hour    Equipment Recommendations  Wheelchair (measurements PT);Wheelchair cushion (measurements PT);Hospital bed (may change with progression)    Recommendations for Other Services       Precautions / Restrictions Precautions Precautions: Fall Restrictions Weight Bearing Restrictions: No      Mobility  Bed Mobility Overal bed mobility: Needs Assistance Bed Mobility: Supine to  Sit;Sit to Supine     Supine to sit: Max assist;+2 for physical assistance Sit to supine: Max assist;+2 for physical assistance   General bed mobility comments: MaxAx2 to pivot buttocks and ascend trunk, displaying good initiation when cued to manage legs to EOB. MaxAx2 to lift legs onto bed and control trunk to return to supine.    Transfers Overall transfer level: Needs assistance Equipment used: 1 person hand held assist;2 person hand held assist Transfers: Sit to/from [employer] Sit to Stand: Max assist;+2 safety/equipment Stand pivot transfers: Max assist;+2 physical assistance       General transfer comment: Sit to stand 1x from EOB, 1x from chair, and 2x from commode with maxA and R knee blocked, cuing pt to hug therapist anterior to her to facilitate anterior lean. Pt with posterior and R lateral lean and R leg apraxia, impacting her ability to step to R to transfer bed > commode 1x, maxAx2. All other transfers between surfaces were made through pt coming to stand and commode, chair, and bed being swapped out behind her.  Ambulation/Gait Ambulation/Gait assistance: Max assist;+2 physical assistance Gait Distance (Feet): 1 Feet Assistive device: 2 person hand held assist Gait Pattern/deviations: Step-through pattern;Decreased stride length;Decreased weight shift to left;Decreased dorsiflexion - right;Staggering right;Trunk flexed Gait velocity: reduced Gait velocity interpretation: <1.31 ft/sec, indicative of household ambulator General Gait Details: Pt with apraxia of R leg, impacting placement with stand step transfer to R, maxAx2. R knee blocked and UE support.  Stairs            Wheelchair Mobility    Modified Rankin (Stroke Patients Only) Modified Rankin (Stroke Patients Only) Pre-Morbid Rankin Score: Moderate disability Modified Rankin: Severe  disability     Balance Overall balance assessment: Needs assistance Sitting-balance support:  Single extremity supported;Feet supported Sitting balance-Leahy Scale: Zero Sitting balance - Comments: MaxA to sit statically EOB with pt displaying posterior and R lateral lean. Postural control: Right lateral lean;Posterior lean Standing balance support: Bilateral upper extremity supported Standing balance-Leahy Scale: Zero Standing balance comment: MaxA and UE support with R knee blocked to stand statically, R lateral and posterior bias.                             Pertinent Vitals/Pain Pain Assessment: Faces Faces Pain Scale: Hurts a little bit Pain Location: generalized grimacing with mobility Pain Descriptors / Indicators: Grimacing Pain Intervention(s): Limited activity within patient's tolerance;Monitored during session;Repositioned    Home Living Family/patient expects to be discharged to:: Private residence Living Arrangements: Spouse/significant other Available Help at Discharge: Family;Available PRN/intermittently Type of Home: House Home Access: Stairs to enter Entrance Stairs-Rails: None Entrance Stairs-Number of Steps: 4 Home Layout: One level (1 stair to enter/exit laundry area) Home Equipment: Walker - 2 wheels;Bedside commode;Other (comment) ([email or web address]) Additional Comments: Husband works during the day    Prior Function Level of Independence: Needs assistance   Gait / Transfers Assistance Needed: Mod I with use of RW  ADL's / Homemaking Assistance Needed: Used L hand to assist R with ADLs        Hand Dominance   Dominant Hand: Right    Extremity/Trunk Assessment   Upper Extremity Assessment Upper Extremity Assessment: Defer to OT evaluation    Lower Extremity Assessment Lower Extremity Assessment: RLE deficits/detail RLE Deficits / Details: Generalized weakness noted; apraxia noted RLE Coordination: decreased fine motor;decreased gross motor    Cervical / Trunk Assessment Cervical / Trunk Assessment: Other exceptions Cervical /  Trunk Exceptions: Increased body habitus, rests with turnk laterally flexed and rotated to R  Communication   Communication: No difficulties  Cognition Arousal/Alertness: Awake/alert Behavior During Therapy: WFL for tasks assessed/performed Overall Cognitive Status: Within Functional Limits for tasks assessed                                 General Comments: Pt attempting to follow commands, but apraxia noted.      General Comments      Exercises     Assessment/Plan    PT Assessment Patient needs continued PT services  PT Problem List Decreased strength;Decreased activity tolerance;Decreased balance;Decreased mobility;Decreased range of motion;Decreased coordination;Decreased knowledge of use of DME       PT Treatment Interventions DME instruction;Gait training;Functional mobility training;Therapeutic activities;Therapeutic exercise;Balance training;Patient/family education;Neuromuscular re-education;Stair training;Wheelchair mobility training    PT Goals (Current goals can be found in the Care Plan section)  Acute Rehab PT Goals Patient Stated Goal: return home PT Goal Formulation: With patient Time For Goal Achievement: 04/16/21 Potential to Achieve Goals: Good    Frequency Min 3X/week   Barriers to discharge Decreased caregiver support husband works    Co-evaluation PT/OT/SLP Co-Evaluation/Treatment: Yes Reason for Co-Treatment: Complexity of the patient's impairments (multi-system involvement);For patient/therapist safety;To address functional/ADL transfers PT goals addressed during session: Mobility/safety with mobility;Balance         AM-PAC PT "6 Clicks" Mobility  Outcome Measure Help needed turning from your back to your side while in a flat bed without using bedrails?: A Lot Help needed moving from lying on your back to sitting on the side of a  flat bed without using bedrails?: Total Help needed moving to and from a bed to a chair (including a  wheelchair)?: Total Help needed standing up from a chair using your arms (e.g., wheelchair or bedside chair)?: A Lot Help needed to walk in hospital room?: Total Help needed climbing 3-5 steps with a railing? : Total 6 Click Score: 8    End of Session Equipment Utilized During Treatment: Gait belt Activity Tolerance: Patient tolerated treatment well Patient left: in bed;with call bell/phone within reach Nurse Communication: Mobility status PT Visit Diagnosis: Other abnormalities of gait and mobility (R26.89);Hemiplegia and hemiparesis;Unsteadiness on feet (R26.81);Muscle weakness (generalized) (M62.81);Difficulty in walking, not elsewhere classified (R26.2);Apraxia (R48.2);Other symptoms and signs involving the nervous system (R29.898) Hemiplegia - Right/Left: Right Hemiplegia - dominant/non-dominant: Dominant Hemiplegia - caused by: Cerebral infarction    Time: 3419-6222 PT Time Calculation (min) (ACUTE ONLY): 27 min   Charges:   PT Evaluation $PT Eval Moderate Complexity: 1 Mod          Raymond Gurney, PT, DPT Acute Rehabilitation Services  Pager: [phone] Office: [phone]   Jewel Baize 04/02/2021, 12:59 PM

## 2021-04-02 NOTE — ED Notes (Signed)
Assisted pt up to bedside toilet.  Pt reports normally able to ambulate with walker and use restroom despite issues with urinary incontinence.  This date pt is found unable to bear weight on right leg with weakness upon standing in general.  Pt also noted to have little to no use of right arm and a constant lean toward the right.

## 2021-04-02 NOTE — Evaluation (Signed)
Occupational Therapy Evaluation Patient Details Name: Jade [name] MRN: [id number] DOB: 10-Oct-1955 Today's Date: 04/02/2021    History of Present Illness Pt is a 65 y.o. female who presented 04/01/21 with R-sided weakness. MRI reveals an embolic appearing stroke on the left, affecting a posterior MCA branch. Imaging revealed severe L ICA origin stenosis. Of note, a few weeks ago pt had stroke affecting her vision and R-hand strength, MRI then revealed acute L thalamic , anterior left frontal and possible right frontal and multiple remote lacunar infarcts. PMH: arthritis, CKD, COPD, CVA, fibromyalgia, gout, HTN, migraine, OSA, DM2   Clinical Impression   This 65 yo female admitted with above presents to acute OT with PLOF of being at home alone while husband at work since prior stroke several weeks ago and managing her own basic ADLs per her report. Currently she is +2 Max A for bed mobility and transfers and setup/S- max A +2 for ADLs. She will continue to benefit from acute OT with follow up OT at SNF recommended.    Follow Up Recommendations  SNF;Supervision/Assistance - 24 hour    Equipment Recommendations  Other (comment) (TBD next venue)       Precautions / Restrictions Precautions Precautions: Fall Restrictions Weight Bearing Restrictions: No      Mobility Bed Mobility Overal bed mobility: Needs Assistance Bed Mobility: Supine to Sit;Sit to Supine     Supine to sit: Max assist;+2 for physical assistance Sit to supine: Max assist;+2 for physical assistance   General bed mobility comments: MaxAx2 to pivot buttocks and ascend trunk, displaying good initiation when cued to manage legs to EOB. MaxAx2 to lift legs onto bed and control trunk to return to supine.    Transfers Overall transfer level: Needs assistance Equipment used: 1 person hand held assist;2 person hand held assist Transfers: Sit to/from [employer] Sit to Stand: Max assist;+2  safety/equipment Stand pivot transfers: Max assist;+2 physical assistance       General transfer comment: Sit to stand 1x from EOB, 1x from chair, and 2x from commode with maxA and R knee blocked, cuing pt to hug therapist anterior to her to facilitate anterior lean. Pt with posterior and R lateral lean and R leg apraxia, impacting her ability to step to R to transfer bed > commode 1x, maxAx2. All other transfers between surfaces were made through pt coming to stand and commode, chair, and bed being swapped out behind her.    Balance Overall balance assessment: Needs assistance Sitting-balance support: Single extremity supported;Feet supported Sitting balance-Leahy Scale: Zero Sitting balance - Comments: MaxA to sit statically EOB with pt displaying posterior and R lateral lean. Postural control: Right lateral lean;Posterior lean Standing balance support: Bilateral upper extremity supported Standing balance-Leahy Scale: Zero Standing balance comment: MaxA and UE support with R knee blocked to stand statically, R lateral and posterior bias.                           ADL either performed or assessed with clinical judgement   ADL Overall ADL's : Needs assistance/impaired Eating/Feeding: Set up;Supervision/ safety Eating/Feeding Details (indicate cue type and reason): supported sitting Grooming: Moderate assistance Grooming Details (indicate cue type and reason): supported sitting Upper Body Bathing: Moderate assistance Upper Body Bathing Details (indicate cue type and reason): supported sitting Lower Body Bathing: Total assistance Lower Body Bathing Details (indicate cue type and reason): Max A+2 sit<>stand Upper Body Dressing : Maximal assistance Upper Body Dressing  Details (indicate cue type and reason): supported sitting Lower Body Dressing: Total assistance Lower Body Dressing Details (indicate cue type and reason): max A +2 sit<>stand Toilet Transfer: Maximal  assistance;+2 for physical assistance;Stand-pivot;BSC Toilet Transfer Details (indicate cue type and reason): Bil HHA Toileting- Clothing Manipulation and Hygiene: Total assistance Toileting - Clothing Manipulation Details (indicate cue type and reason): Max A+2 sit<>stand             Vision   Vision Assessment?: Yes;Vision impaired- to be further tested in functional context            Pertinent Vitals/Pain Pain Assessment: Faces Faces Pain Scale: Hurts a little bit Pain Location: generalized grimacing with mobility Pain Descriptors / Indicators: Grimacing Pain Intervention(s): Limited activity within patient's tolerance;Monitored during session;Repositioned     Hand Dominance Right   Extremity/Trunk Assessment Upper Extremity Assessment Upper Extremity Assessment: RUE deficits/detail RUE Deficits / Details: Pt with limited AROM, PROM WNL RUE Coordination: decreased gross motor;decreased fine motor   Lower Extremity Assessment Lower Extremity Assessment: RLE deficits/detail RLE Deficits / Details: Generalized weakness noted; apraxia noted RLE Coordination: decreased fine motor;decreased gross motor   Cervical / Trunk Assessment Cervical / Trunk Assessment: Other exceptions Cervical / Trunk Exceptions: Increased body habitus, rests with turnk laterally flexed and rotated to R   Communication Communication Communication: No difficulties   Cognition Arousal/Alertness: Awake/alert Behavior During Therapy: WFL for tasks assessed/performed Overall Cognitive Status: Within Functional Limits for tasks assessed                                 General Comments: Pt attempting to follow commands, but apraxia noted.              Home Living Family/patient expects to be discharged to:: Skilled nursing facility Living Arrangements: Spouse/significant other Available Help at Discharge: Family;Available PRN/intermittently Type of Home: House Home Access:  Stairs to enter [employer] of Steps: 4 Entrance Stairs-Rails: None Home Layout: One level (1 stair at exit/entry)     Bathroom Shower/Tub: Tub/shower unit   [employer]: Handicapped height     Home Equipment: Environmental consultant - 2 wheels;Bedside commode;Other (comment) (hurry cane)   Additional Comments: Husband works during the day  Lives With: Spouse    Prior Functioning/Environment Level of Independence: Needs assistance  Gait / Transfers Assistance Needed: Mod I with use of RW ADL's / Homemaking Assistance Needed: Used L hand to assist R with ADLs            OT Problem List: Decreased strength;Decreased range of motion;Decreased activity tolerance;Impaired balance (sitting and/or standing);Impaired vision/perception;Impaired UE functional use;Impaired tone;Decreased cognition;Decreased safety awareness;Decreased coordination      OT Treatment/Interventions: Self-care/ADL training;Therapeutic exercise;Therapeutic activities;Neuromuscular education;DME and/or AE instruction;Patient/family education;Visual/perceptual remediation/compensation;Balance training    OT Goals(Current goals can be found in the care plan section) Acute Rehab OT Goals Patient Stated Goal: return home OT Goal Formulation: With patient Time For Goal Achievement: 03/21/21 Potential to Achieve Goals: Good  OT Frequency: Min 2X/week   Barriers to D/C: Decreased caregiver support  husband works days       Co-evaluation PT/OT/SLP Co-Evaluation/Treatment: Yes Reason for Co-Treatment: Complexity of the patient's impairments (multi-system involvement);Necessary to address cognition/behavior during functional activity;For patient/therapist safety;To address functional/ADL transfers PT goals addressed during session: Mobility/safety with mobility;Balance OT goals addressed during session: ADL's and self-care;Strengthening/ROM      AM-PAC OT "6 Clicks" Daily Activity     Outcome Measure  Help from  another person eating meals?: A Little Help from another person taking care of personal grooming?: A Lot Help from another person toileting, which includes using toliet, bedpan, or urinal?: A Lot Help from another person bathing (including washing, rinsing, drying)?: A Lot Help from another person to put on and taking off regular upper body clothing?: A Lot Help from another person to put on and taking off regular lower body clothing?: Total 6 Click Score: 12   End of Session Equipment Utilized During Treatment: Gait belt Nurse Communication: Mobility status (use bed pan)  Activity Tolerance: Patient tolerated treatment well Patient left: in bed;with call bell/phone within reach  OT Visit Diagnosis: Unsteadiness on feet (R26.81);Other abnormalities of gait and mobility (R26.89);Muscle weakness (generalized) (M62.81);Low vision, both eyes (H54.2);Other symptoms and signs involving cognitive function;Hemiplegia and hemiparesis Hemiplegia - Right/Left: Right Hemiplegia - dominant/non-dominant: Dominant Hemiplegia - caused by: Cerebral infarction                Time: 2426-8341 OT Time Calculation (min): 26 min Charges:  OT General Charges $OT Visit: 1 Visit OT Evaluation $OT Eval Moderate Complexity: 1 Mod  Ignacia Palma, OTR/L Acute [employer] Pager [phone] Office [phone]    Evette Georges 04/02/2021, 2:19 PM

## 2021-04-02 NOTE — Consult Note (Signed)
Neurology Consultation Reason for Consult: Right arm weakness Referring Physician: Adela Glimpse, a  CC: Right arm weakness  History is obtained from: Patient, chart  HPI: Jade [name] is a 65 y.o. female with a history of hypertension, hyperlipidemia who presented very recently with small ischemic infarct of the thalamus was discharged on dual antiplatelet therapy.  Today around 2:30 PM she got up off the toilet and noticed that her right hand was weak.  And her right leg was weak as well.  Her right leg has improved, but her arm remains weak and therefore she presented to the emergency department.  In the emergency department an MRI reveals an embolic appearing stroke on the left.   LKW: 2:30 PM tpa given?: no, recent stroke    ROS: A 14 point ROS was performed and is negative except as noted in the HPI.  Past Medical History:  Diagnosis Date   Arthritis    "back, knees, some in my left hip" (03/21/2018)   Chronic kidney disease    "was told I had 25% kidney function in early 2012" (03/21/2018)   COPD (chronic obstructive pulmonary disease) (HCC)    CVA (cerebral vascular accident) (HCC) 03/21/2018   "numb all over; head to toe" (03/21/2018)   Fibromyalgia    History of gout    Hyperlipidemia    Hypertension    Migraine    "used to get them monthly during menopause" (03/21/2018)   Neuromuscular disorder (HCC)    OSA (obstructive sleep apnea)    "don't tolerate the machine" (03/21/2018)   Pneumonia ~ 2007   Type 2 diabetes, diet controlled (HCC)      Family History  Problem Relation Age of Onset   Heart disease Father    Leukemia Father    Alcohol abuse Son    Other Mother    Cancer Neg Hx    Diabetes Neg Hx    Hearing loss Neg Hx    Hyperlipidemia Neg Hx    Hypertension Neg Hx    Kidney disease Neg Hx    Stroke Neg Hx      Social History:  reports that she quit smoking about 33 years ago. Her smoking use included cigarettes. She has a 20.00 pack-year smoking  history. She has never used smokeless tobacco. She reports previous alcohol use. She reports previous drug use. Drug: Marijuana.   Exam: Current vital signs: BP (!) 202/82   Pulse 79   Temp 98.3 F (36.8 C) (Oral)   Resp 16   Ht 5\' 1"  (1.549 m)   Wt 77.1 kg   SpO2 98%   BMI 32.12 kg/m  Vital signs in last 24 hours: Temp:  [98.3 F (36.8 C)] 98.3 F (36.8 C) (08/06 2010) Pulse Rate:  [77-87] 79 (08/07 0203) Resp:  [16-23] 16 (08/07 0203) BP: (170-204)/(73-82) 202/82 (08/07 0203) SpO2:  [97 %-98 %] 98 % (08/07 0203) Weight:  [77.1 kg] 77.1 kg (08/06 2010)   Physical Exam  Constitutional: Appears well-developed and well-nourished.  Psych: Affect appropriate to situation Eyes: No scleral injection HENT: No OP obstruction MSK: no joint deformities.  Cardiovascular: Normal rate and regular rhythm.  Respiratory: Effort normal, non-labored breathing GI: Soft.  No distension. There is no tenderness.  Skin: WDI  Neuro: Mental Status: Patient is awake, alert, oriented to person, place, month, year, and situation. Patient is able to give a clear and coherent history. No signs of aphasia or neglect Cranial Nerves: II: Visual Fields are full. Pupils are equal,  round, and reactive to light.   III,IV, VI: EOMI without ptosis or diploplia.  V: Facial sensation is symmetric to temperature VII: Facial movement is with mild right facial weakness VIII: hearing is intact to voice X: Uvula elevates symmetrically XI: Shoulder shrug is symmetric. XII: tongue is midline without atrophy or fasciculations.  Motor: Tone is normal. Bulk is normal. 5/5 strength was present on the left, on the right she has severe right arm weakness with little movement around the wrist, but she is able to lift the arm against gravity to some degree, right leg 4+/5 Sensory: Sensation is mildly diminished in the right arm and leg Cerebellar: unable to perform on right     I have reviewed labs in epic and  the results pertinent to this consultation are: LDL is 78 represents a significant improvement from 3 weeks ago  I have reviewed the images obtained: MRI brain-embolic appearing stroke affecting a posterior MCA branch  Impression: 65 year old female with left carotid stenosis who presents with embolic appearing left-sided stroke.  My suspicion is that this represents symptomatic carotid stenosis, though cardioembolic is possible.  She just had an echo and I think there is relatively little utility in repeating it.  Would favor CTA head and neck to ensure there is no more proximal occlusion hiding especially given that she had worse symptoms at onset.  Recommendations: 1) CTA head neck 2) continue dual antiplatelet therapy with aspirin Plavix 3) PT, OT, ST 4) vascular surgery consultation   Ritta Slot, MD [facility] [phone]  If 7pm- 7am, please page neurology on call as listed in [facility].

## 2021-04-02 NOTE — Progress Notes (Signed)
PROGRESS NOTE    Jade [name]  JGG:[id number] DOB: 1955-09-04 DOA: 04/01/2021 PCP: System, Provider Not In   Brief Narrative: Jade [name] is a 65 y.o. female with a history of recent CVA, COPD, CKD, hyperlipidemia, hypertension, obesity, diabetes mellitus type 2, diabetic retinopathy.  Patient presented secondary to numbness and weakness of her right hand and was found to have evidence of an acute left posterior MCA branch CVA likely related to history of severe left ICA stenosis.  Vascular surgery consulted for consideration of CEA versus TCAR.   Assessment & Plan:   Active Problems:   Diabetes mellitus type 2 with neurological manifestations (HCC)   Pure hypercholesterolemia   Essential hypertension, benign   Acute CVA (cerebrovascular accident) (HCC)   Obesity, Class III, BMI 40-49.9 (morbid obesity) (HCC)   TIA (transient ischemic attack)   Carotid artery stenosis with cerebral infarction [facility])   COPD (chronic obstructive pulmonary disease) (HCC)   Acute CVA Recent admission for acute CVA.  MRI on admission significant for left posterior MCA branch infarct in setting of left ICA stenosis from CTA 2 weeks ago.  Neurology consulted and have recommended for vascular surgery consult for consideration of left CEA versus TCAR.  CTA head and neck ordered and significant for severe left ICA stenosis measured at over 80% -Neurology recommendations: Aspirin/Plavix, PT, OT, speech therapy, vascular surgery consult  Left ICA stenosis Vascular surgery consulted in setting of above. Vascular surgery recommendations: Plan for left TCAR on 8/11  Hyperlipidemia -Continue Lipitor  Primary pretension Currently uncontrolled, but in setting of acute CVA allowing for permissive hypertension pending neurology recommendations.  Patient is on amlodipine and hydrochlorothiazide as an outpatient. -Permissive hypertension  Diabetes mellitus, type II Diabetic retinopathy Patient is on  diet control as an outpatient.  Hemoglobin A1c is 6.5%.  With regard to retinopathy, patient currently follows with ophthalmology as an outpatient. -Continue sliding scale insulin  COPD Stable.  No wheezing. -Continue albuterol as needed  Obesity Body mass index is 32.12 kg/m.   DVT prophylaxis: Aspirin/Plavix/SCDs Code Status:   Code Status: DNR Family Communication: Husband at bedside Disposition Plan: Discharge possibly to SNF pending continued PT recommendations in several days pending vascular surgery management/intervention   Consultants:  Neurology Vascular surgery  Procedures:  None  Antimicrobials: None   Subjective: Patient is hungry.  Has had a little bit more weakness on her right side.  Otherwise no concerns.  Objective: Vitals:   04/02/21 1400 04/02/21 1500 04/02/21 1600 04/02/21 1700  BP: (!) 196/90 (!) 168/75 (!) 187/76 (!) 172/78  Pulse: (!) 56 63 63 (!) 59  Resp: 12 16 20 17   Temp:      TempSrc:      SpO2: 97% 96% 95% 96%  Weight:      Height:       No intake or output data in the 24 hours ending 04/02/21 1730 Filed Weights   04/01/21 2010  Weight: 77.1 kg    Examination:  General exam: Appears calm and comfortable Respiratory system: Clear to auscultation. Respiratory effort normal. Cardiovascular system: S1 & S2 heard, RRR. No murmurs, rubs, gallops or clicks. Gastrointestinal system: Abdomen is nondistended, soft and nontender. No organomegaly or masses felt. Normal bowel sounds heard. Central nervous system: Alert and oriented. 1/5 RUE strength and 4/5 B/L LE strength Musculoskeletal: No edema. No calf tenderness Skin: No cyanosis. No rashes Psychiatry: Judgement and insight appear normal. Mood & affect appropriate.     Data Reviewed: I have  personally reviewed following labs and imaging studies  CBC Lab Results  Component Value Date   WBC 10.5 04/01/2021   RBC 4.61 04/01/2021   HGB 15.0 04/01/2021   HCT 43.2 04/01/2021    MCV 93.7 04/01/2021   MCH 32.5 04/01/2021   PLT 365 04/01/2021   MCHC 34.7 04/01/2021   RDW 12.7 04/01/2021   LYMPHSABS 1.7 04/01/2021   MONOABS 0.8 04/01/2021   EOSABS 0.1 04/01/2021   BASOSABS 0.1 04/01/2021     Last metabolic panel Lab Results  Component Value Date   NA 139 04/01/2021   K 3.3 (L) 04/01/2021   CL 106 04/01/2021   CO2 22 04/01/2021   BUN 21 04/01/2021   CREATININE 0.96 04/01/2021   GLUCOSE 168 (H) 04/01/2021   GFRNONAA >60 04/01/2021   GFRAA >60 03/23/2018   CALCIUM 10.0 04/01/2021   PHOS 3.1 04/02/2021   PROT 7.1 04/01/2021   ALBUMIN 4.0 04/01/2021   BILITOT 1.0 04/01/2021   ALKPHOS 75 04/01/2021   AST 23 04/01/2021   ALT 17 04/01/2021   ANIONGAP 11 04/01/2021    CBG (last 3)  Recent Labs    04/02/21 0803 04/02/21 1219 04/02/21 1648  GLUCAP 123* 116* 119*     GFR: Estimated Creatinine Clearance: 54.1 mL/min (by C-G formula based on SCr of 0.96 mg/dL).  Coagulation Profile: Recent Labs  Lab 04/01/21 2022  INR 1.0    Recent Results (from the past 240 hour(s))  SARS CORONAVIRUS 2 (TAT 6-24 HRS) Nasopharyngeal Nasopharyngeal Swab     Status: None   Collection Time: 04/02/21 12:49 AM   Specimen: Nasopharyngeal Swab  Result Value Ref Range Status   SARS Coronavirus 2 NEGATIVE NEGATIVE Final    Comment: (NOTE) SARS-CoV-2 target nucleic acids are NOT DETECTED.  The SARS-CoV-2 RNA is generally detectable in upper and lower respiratory specimens during the acute phase of infection. Negative results do not preclude SARS-CoV-2 infection, do not rule out co-infections with other pathogens, and should not be used as the sole basis for treatment or other patient management decisions. Negative results must be combined with clinical observations, patient history, and epidemiological information. The expected result is Negative.  Fact Sheet for Patients: HairSlick.no  Fact Sheet for Healthcare  Providers: quierodirigir.com  This test is not yet approved or cleared by the [place] FDA and  has been authorized for detection and/or diagnosis of SARS-CoV-2 by FDA under an Emergency Use Authorization (EUA). This EUA will remain  in effect (meaning this test can be used) for the duration of the COVID-19 declaration under Se ction 564(b)(1) of the Act, 21 U.S.C. section 360bbb-3(b)(1), unless the authorization is terminated or revoked sooner.  Performed at [facility], [address] [place], [place] [address]         Radiology Studies: CT ANGIO HEAD NECK W WO CM  Result Date: 04/02/2021 CLINICAL DATA:  Stroke workup EXAM: CT ANGIOGRAPHY HEAD AND NECK TECHNIQUE: Multidetector CT imaging of the head and neck was performed using the standard protocol during bolus administration of intravenous contrast. Multiplanar CT image reconstructions and MIPs were obtained to evaluate the vascular anatomy. Carotid stenosis measurements (when applicable) are obtained utilizing NASCET criteria, using the distal internal carotid diameter as the denominator. CONTRAST:  OMNIPAQUE IOHEXOL 350 MG/ML SOLN COMPARISON:  Brain MRI from yesterday FINDINGS: CTA NECK FINDINGS Aortic arch: Atheromatous plaque.  Three vessel branching. Right carotid system: Moderately tortuous vessels. Calcified plaque at the ICA bulb causing 40% stenosis. No ulceration.  Left carotid system: Calcified plaque at the bifurcation with high-grade stenosis at the ICA origin, at least 80%. No dissection or ulceration. Vertebral arteries: Proximal subclavian atherosclerosis without flow limiting stenosis or ulceration. Right V1 atheromatous plaque without significant stenosis. Skeleton: Bridging thoracic syndesmophytes. Other neck: No acute finding. Upper chest: No acute finding Review of the MIP images confirms the above findings CTA HEAD FINDINGS Anterior circulation: Atheromatous plaque on the  carotid siphons. Highly attenuated left M3 branch. Suspect diffuse mild atheromatous change to medium size branches. No proximal branch occlusion or beading. Negative for aneurysm Posterior circulation: V4 segment atherosclerosis on the right. No flow limiting stenosis of vertebral and basilar arteries. High-grade bilateral P2 to P3 segment stenosis. Negative for aneurysm Venous sinuses: Unremarkable in the arterial phase. Anatomic variants: None significant Review of the MIP images confirms the above findings IMPRESSION: 1. Severe left ICA origin stenosis due to calcified plaque, over 80%. 2. 40% narrowing at the right ICA origin. 3. Intracranial atherosclerosis most notably causing advanced bilateral PCA stenosis. 4. High-grade high-grade left M3 branch stenosis which could be related to atherosclerosis or recent embolic disease. Electronically Signed   By: Marnee Spring M.D.   On: 04/02/2021 06:34   CT HEAD WO CONTRAST  Result Date: 04/01/2021 CLINICAL DATA:  Right side numbness EXAM: CT HEAD WITHOUT CONTRAST TECHNIQUE: Contiguous axial images were obtained from the base of the skull through the vertex without intravenous contrast. COMPARISON:  03/06/2021 FINDINGS: Brain: There is atrophy and chronic small vessel disease changes. No acute intracranial abnormality. Specifically, no hemorrhage, hydrocephalus, mass lesion, acute infarction, or significant intracranial injury. Vascular: No hyperdense vessel or unexpected calcification. Skull: No acute calvarial abnormality. Sinuses/Orbits: No acute findings Other: None IMPRESSION: Atrophy, chronic microvascular disease. No acute intracranial abnormality. Electronically Signed   By: Charlett Nose M.D.   On: 04/01/2021 21:50   MR BRAIN WO CONTRAST  Result Date: 04/01/2021 CLINICAL DATA:  Right-sided weakness EXAM: MRI HEAD WITHOUT CONTRAST TECHNIQUE: Multiplanar, multiecho pulse sequences of the brain and surrounding structures were obtained without intravenous  contrast. COMPARISON:  None. FINDINGS: Brain: Small acute infarct at the left frontoparietal junction. Additional small focus acute ischemia adjacent to the frontal horn of the left lateral ventricle with associated magnetic susceptibility effect, possibly petechial hemorrhage. Right middle cranial fossa arachnoid cyst. There is multifocal hyperintense T2-weighted signal within the white matter. Generalized volume loss without a clear lobar predilection. Multiple old small vessel infarcts of the gray nuclei. Vascular: Major flow voids are preserved. Skull and upper cervical spine: Normal calvarium and skull base. Visualized upper cervical spine and soft tissues are normal. Sinuses/Orbits:No paranasal sinus fluid levels or advanced mucosal thickening. No mastoid or middle ear effusion. Normal orbits. IMPRESSION: 1. Small acute infarct at the left frontoparietal junction. Additional small focus of acute ischemia adjacent to the frontal horn of the left lateral ventricle with petechial hemorrhage. 2. Multiple old small vessel infarcts and findings of chronic ischemic microangiopathy. Electronically Signed   By: Deatra Robinson M.D.   On: 04/01/2021 23:43   DG CHEST PORT 1 VIEW  Result Date: 04/02/2021 CLINICAL DATA:  Right-sided numbness and history of prior strokes EXAM: PORTABLE CHEST 1 VIEW COMPARISON:  03/05/2021 FINDINGS: Cardiac shadow is stable. Aortic calcifications are again seen. Patient is rotated to the right accentuating the mediastinal markings. Lungs are clear. No bony abnormality is noted. IMPRESSION: No active disease. Electronically Signed   By: Alcide Clever M.D.   On: 04/02/2021 01:00  Scheduled Meds:   stroke: mapping our early stages of recovery book   Does not apply Once   aspirin EC  81 mg Oral Daily   atorvastatin  80 mg Oral Daily   clopidogrel  75 mg Oral Daily   insulin aspart  0-9 Units Subcutaneous Q4H   Continuous Infusions:  sodium chloride 999 mL (04/02/21 0426)      LOS: 0 days     Jacquelin Hawking, MD [facility] 04/02/2021, 5:30 PM  If 7PM-7AM, please contact night-coverage [email or web address]

## 2021-04-02 NOTE — ED Notes (Signed)
Pt insists on having b/p cuff on leg for ongoing monitoring due to discomfort of cuff on arm.  Leg readings elevated, manual b/p left arm 168/86

## 2021-04-02 NOTE — ED Notes (Signed)
PT eval in progress 

## 2021-04-02 NOTE — ED Notes (Signed)
Speech Therapy eval in progress at bedside

## 2021-04-02 NOTE — Evaluation (Signed)
Speech Language Pathology Evaluation Patient Details Name: Jade [name] MRN: [id number] DOB: 1956/07/26 Today's Date: 04/02/2021 Time: 1015-1030 SLP Time Calculation (min) (ACUTE ONLY): 15 min  Problem List:  Patient Active Problem List   Diagnosis Date Noted   TIA (transient ischemic attack) 04/02/2021   Carotid artery stenosis with cerebral infarction (HCC) 04/02/2021   COPD (chronic obstructive pulmonary disease) (HCC) 04/02/2021   Stroke-like symptoms 03/05/2021   Acute CVA (cerebrovascular accident) (HCC) 03/21/2018   Obesity, Class III, BMI 40-49.9 (morbid obesity) (HCC) 03/21/2018   IBS (irritable bowel syndrome) 04/12/2015   Hair loss 10/20/2014   Primary osteoarthritis of left knee 09/29/2014   Visit for screening mammogram 08/30/2014   Routine general medical examination at a health care facility 08/30/2014   Tinea corporis 12/21/2013   Essential hypertension, benign 12/21/2013   Low back pain 08/24/2013   Patient noncompliant with statin medication 02/23/2013   H/O abnormal Pap smear 10/24/2012   Osteopenia 09/26/2012   Diabetic neuropathy, painful (HCC) 05/26/2012   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   Pure hypercholesterolemia 03/20/2012   Chronic venous insufficiency 03/20/2012   Past Medical History:  Past Medical History:  Diagnosis Date   Arthritis    "back, knees, some in my left hip" (03/21/2018)   Chronic kidney disease    "was told I had 25% kidney function in early 2012" (03/21/2018)   COPD (chronic obstructive pulmonary disease) (HCC)    CVA (cerebral vascular accident) (HCC) 03/21/2018   "numb all over; head to toe" (03/21/2018)   Fibromyalgia    History of gout    Hyperlipidemia    Hypertension    Migraine    "used to get them monthly during menopause" (03/21/2018)   Neuromuscular disorder (HCC)    OSA (obstructive sleep apnea)    "don't tolerate the machine" (03/21/2018)   Pneumonia ~ 2007   Type 2  diabetes, diet controlled (HCC)    Past Surgical History:  Past Surgical History:  Procedure Laterality Date   HERNIA REPAIR     LAPAROSCOPIC CHOLECYSTECTOMY  06/2007   "no UHR w/this" (03/21/2018)   TONSILLECTOMY     UMBILICAL HERNIA REPAIR  2009   HPI:  [name] is a 65 y.o. female with medical history significant of CVA, COPD, CKD, HLD, HTN, OSA, GERD DM2.  Presented with right hand weakness.  She tried to get off the toilet by grabbing onto the grab bar but could not secondary to weakness of her right hand.  She had recent stroke a few weeks ago that affected her vision.  She did have some mild residual right hand deficits as well that are currently much worse.  No additional visual changes.  No new head MRI or CT but she did have a CT angiography.  It was showing the following:  Severe left ICA origin stenosis due to calcified plaque, over 80%.  2. 40% narrowing at the right ICA origin.  3. Intracranial atherosclerosis most notably causing advanced bilateral PCA stenosis.  4. High-grade high-grade left M3 branch stenosis which could be related to atherosclerosis or recent embolic disease.  She has a vascular surgery consult pending.   Assessment / Plan / Recommendation Clinical Impression  Language evaluation and motor speech screen was completed.  She is known to ST service from previous admission with noted mild word finding issues and cognitive deficits.  Cranial nerve exam was completed and remarkable for slight left sided facial droop.  Otherwise, lingual, facial and jaw  range of motion and strength appeared to be adequate.  Facial sensation appeared to be intact and she did not endorse a difference in sensation from the right to left side of her face.  Speech was clear and easy to understand.  No discernible dysarthria or apraxia was noted.  She complained of ongoing visual issues.  She completed the [place] Aphasia Screening Test scoring an overall 90/100.  Expressive language  skills appeared to be intact.  She was able to name objects, complete automatic speech tasks, repeat words/sentences, spell words and describe a picture with fluent speech.  Her eceptive language skills appeared to be grossly intact.  Relative strengths were object recognition and following 1-2 step instructions.  She was 90% accurate on yes/no questions and 60% accurate on reading/comprehending sentences.  Her known visual disturbance may have played a role in the difficulties noted with the reading task.   Given results of this evaluation ST will not follow during acute stay.  If we can be of further assistance please feel free to reconsult.    SLP Assessment  SLP Recommendation/Assessment: Patient does not need any further Speech Lanaguage Pathology Services SLP Visit Diagnosis: Aphasia (R47.01)    Follow Up Recommendations  None          SLP Evaluation Cognition          Comprehension  Auditory Comprehension Yes/No Questions: Impaired Basic Biographical Questions: 76-100% accurate Other Yes/No Questions Comments`: Minimal Conversation: Simple Reading Comprehension Reading Status: Impaired Sentence Level: Impaired    Expression Expression Primary Mode of Expression: Verbal Verbal Expression Overall Verbal Expression: Appears within functional limits for tasks assessed Initiation: No impairment Automatic Speech: Name;Social Response;Counting;Day of week Level of Generative/Spontaneous Verbalization: Phrase;Sentence Repetition: No impairment Naming: No impairment Pragmatics: No impairment Non-Verbal Means of Communication: Not applicable Written Expression Dominant Hand:  (Not assessed) Written Expression: Not tested   Oral / Motor  Oral Motor/Sensory Function Overall Oral Motor/Sensory Function: Mild impairment Facial ROM: Reduced left Facial Symmetry: Abnormal symmetry left Facial Strength: Reduced left Facial Sensation: Within Functional Limits Lingual ROM: Within  Functional Limits Lingual Symmetry: Within Functional Limits Lingual Strength: Within Functional Limits Mandible: Within Functional Limits Motor Speech Overall Motor Speech: Appears within functional limits for tasks assessed Respiration: Within functional limits Phonation: Normal Resonance: Within functional limits Articulation: Within functional limitis Intelligibility: Intelligible Motor Planning: Witnin functional limits Motor Speech Errors: Not applicable   GO                   Dimas Aguas, MA, CCC-SLP Acute Rehab SLP [phone]  Fleet Contras 04/02/2021, 11:11 AM

## 2021-04-02 NOTE — Progress Notes (Addendum)
STROKE TEAM PROGRESS NOTE   ATTENDING NOTE: I reviewed above note and agree with the assessment and plan. Pt was seen and examined.   65 year old female with history of hypertension, diabetes, hyperlipidemia, OSA, obesity, CKD, stroke 3 weeks ago admitted for right arm leg weakness.  CT no acute abnormality.  MRI showed left frontal paracentral gyrus cortical infarct.  CT head and neck showed left ICA over 80% stenosis.  Right ICA 40% stenosis.  Bilateral PCA and left M3 severe stenosis.  LDL 78, A1c 6.5.  UDS negative.  Patient admitted in 02/2021 for blurry vision and hypertension, MRI showed left thalamic small infarcts.  CTA head and neck showed left ICA 75% stenosis.  MRA head showed severe distal right M2, and right P2 stenosis.  A1c 6.4, LDL 184.  EF 55 to 60%, creatinine 1.10.  She was discharged with aspirin 81 Plavix 75 DAPT for 3 weeks and then Plavix alone, as well as Lipitor 80.  She had a stroke in 02/2018 with left thalamic/internal capsule infarct.  EF 60 to 65%, LDL 130, A1c 5.8.  CT head and neck at that time left ICA 80% stenosis.  Referred her to vascular surgery and was planning to do left CEA, however patient canceled the procedure herself.  She then followed with her PCP and referred to see a neurologist Dr. Charyl Dancer in [place], had carotid Doppler on 02/20/2021 which reported as unremarkable.  Neuro - awake, alert, eyes open, orientated to age, place, time and people. No aphasia but intermittent paraphasic errors, able to name and repeat, following all simple commands. No gaze palsy, tracking bilaterally, visual field full, PERRL. No facial droop. Tongue midline. LUE 5/5, no drift. RUE 2-/5 proximal and 0/5 distal. LLE 5/5, no drift. RLE 4+/5 proximal and 4/5 distally. Sensation decreased on the RUE, left FTN intact, gait not tested.   Etiology for patient current stroke likely due to left ICA high-grade stenosis.  Had vascular surgery consultation with Dr. Lenell Antu, plan for TCAR on  04/06/2021.  Recommend to continue DAPT and Lipitor 80.  Further antiplatelet regimen per Dr. Lenell Antu.  Continue PT/OT.  For detailed assessment and plan, please refer to above as I have made changes wherever appropriate.   Neurology will sign off. Please call with questions. Pt will follow up with stroke clinic Dr. Pearlean Brownie at [facility] on 06/14/2021. Thanks for the consult.   Marvel Plan, MD PhD Stroke Neurology 04/02/2021 4:15 PM    INTERVAL HISTORY No acute events since arrival. No visitors at bedside. Patient reports onset of her right arm and leg weakness suddenly while sitting on toilet yesterday. Son had to help her out of bathroom and to chair as she was unable to walk. She then noted an improvement in the right leg weakness but persistent R arm and hand weakness so son brought her in to hospital. Today she feels the right arm is worse as she was able to lift it off the bed yesterday but cannot today. She feels her right leg weakness remains the same. She understands that her carotid blockage on the left is likely the cause of her current stroke. She is willing to consider intervention. We discussed her stroke diagnosis, ongoing work up and plan of care. Questions were answered.   Vitals:   04/02/21 0400 04/02/21 0618 04/02/21 0700 04/02/21 0800  BP: (!) 190/77 (!) 165/103 (!) 172/86 (!) 190/93  Pulse: 71 82 72 62  Resp: 19 20 19 16   Temp:  TempSrc:      SpO2: 94% 99% 95% 99%  Weight:      Height:       CBC:  Recent Labs  Lab 04/01/21 2022  WBC 10.5  NEUTROABS 7.8*  HGB 15.0  HCT 43.2  MCV 93.7  PLT 365   Basic Metabolic Panel:  Recent Labs  Lab 04/01/21 2022 04/02/21 0411  NA 139  --   K 3.3*  --   CL 106  --   CO2 22  --   GLUCOSE 168*  --   BUN 21  --   CREATININE 0.96  --   CALCIUM 10.0  --   MG  --  2.0  PHOS  --  3.1   Lipid Panel:  Recent Labs  Lab 04/02/21 0411  CHOL 160  TRIG 86  HDL 65  CHOLHDL 2.5  VLDL 17  LDLCALC 78   HgbA1c:  Recent  Labs  Lab 04/01/21 2020  HGBA1C 6.5*   Urine Drug Screen:  Recent Labs  Lab 04/01/21 2104  LABOPIA NONE DETECTED  COCAINSCRNUR NONE DETECTED  LABBENZ NONE DETECTED  AMPHETMU NONE DETECTED  THCU NONE DETECTED  LABBARB NONE DETECTED    Alcohol Level  Recent Labs  Lab 04/01/21 2022  ETH <10    IMAGING past 24 hours CT ANGIO HEAD NECK W WO CM  Result Date: 04/02/2021 CLINICAL DATA:  Stroke workup EXAM: CT ANGIOGRAPHY HEAD AND NECK TECHNIQUE: Multidetector CT imaging of the head and neck was performed using the standard protocol during bolus administration of intravenous contrast. Multiplanar CT image reconstructions and MIPs were obtained to evaluate the vascular anatomy. Carotid stenosis measurements (when applicable) are obtained utilizing NASCET criteria, using the distal internal carotid diameter as the denominator. CONTRAST:  OMNIPAQUE IOHEXOL 350 MG/ML SOLN COMPARISON:  Brain MRI from yesterday FINDINGS: CTA NECK FINDINGS Aortic arch: Atheromatous plaque.  Three vessel branching. Right carotid system: Moderately tortuous vessels. Calcified plaque at the ICA bulb causing 40% stenosis. No ulceration. Left carotid system: Calcified plaque at the bifurcation with high-grade stenosis at the ICA origin, at least 80%. No dissection or ulceration. Vertebral arteries: Proximal subclavian atherosclerosis without flow limiting stenosis or ulceration. Right V1 atheromatous plaque without significant stenosis. Skeleton: Bridging thoracic syndesmophytes. Other neck: No acute finding. Upper chest: No acute finding Review of the MIP images confirms the above findings CTA HEAD FINDINGS Anterior circulation: Atheromatous plaque on the carotid siphons. Highly attenuated left M3 branch. Suspect diffuse mild atheromatous change to medium size branches. No proximal branch occlusion or beading. Negative for aneurysm Posterior circulation: V4 segment atherosclerosis on the right. No flow limiting stenosis  of vertebral and basilar arteries. High-grade bilateral P2 to P3 segment stenosis. Negative for aneurysm Venous sinuses: Unremarkable in the arterial phase. Anatomic variants: None significant Review of the MIP images confirms the above findings IMPRESSION: 1. Severe left ICA origin stenosis due to calcified plaque, over 80%. 2. 40% narrowing at the right ICA origin. 3. Intracranial atherosclerosis most notably causing advanced bilateral PCA stenosis. 4. High-grade high-grade left M3 branch stenosis which could be related to atherosclerosis or recent embolic disease. Electronically Signed   By: Marnee Spring M.D.   On: 04/02/2021 06:34   CT HEAD WO CONTRAST  Result Date: 04/01/2021 CLINICAL DATA:  Right side numbness EXAM: CT HEAD WITHOUT CONTRAST TECHNIQUE: Contiguous axial images were obtained from the base of the skull through the vertex without intravenous contrast. COMPARISON:  03/06/2021 FINDINGS: Brain: There is atrophy and chronic  small vessel disease changes. No acute intracranial abnormality. Specifically, no hemorrhage, hydrocephalus, mass lesion, acute infarction, or significant intracranial injury. Vascular: No hyperdense vessel or unexpected calcification. Skull: No acute calvarial abnormality. Sinuses/Orbits: No acute findings Other: None IMPRESSION: Atrophy, chronic microvascular disease. No acute intracranial abnormality. Electronically Signed   By: Charlett Nose M.D.   On: 04/01/2021 21:50   MR BRAIN WO CONTRAST  Result Date: 04/01/2021 CLINICAL DATA:  Right-sided weakness EXAM: MRI HEAD WITHOUT CONTRAST TECHNIQUE: Multiplanar, multiecho pulse sequences of the brain and surrounding structures were obtained without intravenous contrast. COMPARISON:  None. FINDINGS: Brain: Small acute infarct at the left frontoparietal junction. Additional small focus acute ischemia adjacent to the frontal horn of the left lateral ventricle with associated magnetic susceptibility effect, possibly petechial  hemorrhage. Right middle cranial fossa arachnoid cyst. There is multifocal hyperintense T2-weighted signal within the white matter. Generalized volume loss without a clear lobar predilection. Multiple old small vessel infarcts of the gray nuclei. Vascular: Major flow voids are preserved. Skull and upper cervical spine: Normal calvarium and skull base. Visualized upper cervical spine and soft tissues are normal. Sinuses/Orbits:No paranasal sinus fluid levels or advanced mucosal thickening. No mastoid or middle ear effusion. Normal orbits. IMPRESSION: 1. Small acute infarct at the left frontoparietal junction. Additional small focus of acute ischemia adjacent to the frontal horn of the left lateral ventricle with petechial hemorrhage. 2. Multiple old small vessel infarcts and findings of chronic ischemic microangiopathy. Electronically Signed   By: Deatra Robinson M.D.   On: 04/01/2021 23:43   DG CHEST PORT 1 VIEW  Result Date: 04/02/2021 CLINICAL DATA:  Right-sided numbness and history of prior strokes EXAM: PORTABLE CHEST 1 VIEW COMPARISON:  03/05/2021 FINDINGS: Cardiac shadow is stable. Aortic calcifications are again seen. Patient is rotated to the right accentuating the mediastinal markings. Lungs are clear. No bony abnormality is noted. IMPRESSION: No active disease. Electronically Signed   By: Alcide Clever M.D.   On: 04/02/2021 01:00    PHYSICAL EXAM Constitutional: Appears well-developed and well-nourished. Psych: Affect appropriate to situation Eyes: No scleral injection HENT: No OP obstruction MSK: no joint deformities. Cardiovascular: Normal rate and regular rhythm. Respiratory: Effort normal, non-labored breathing GI: Soft.  No distension. There is no tenderness. Skin: WDI   Neuro: Mental Status: Patient is awake, alert, oriented to person, place, month, year, and situation. Speech is clear, fluent and appropriate. Cranial Nerves: II: Visual Fields are full. Pupils are equal, round, and  reactive to light.   III,IV, VI: EOMI without ptosis or diploplia. V: Facial sensation is symmetric to temperature VII: Facial movement is with mild right facial weakness VIII: hearing is intact to voice X: Uvula elevates symmetrically XI: Shoulder shrug is symmetric. XII: tongue is midline without atrophy or fasciculations. Motor: Tone is normal. Bulk is normal. 5/5 strength was present on the left, on the right she has severe right arm weakness with inability to lift against gravity. Able to elevate thumb minimally against gravity. Right leg 4+/5.  Sensory: Sensation is mildly diminished in the right arm and leg Cerebellar: unable to perform on right  ASSESSMENT/PLAN  OLIS ROKOSZ is a 65 y.o. female with a history of hypertension, hyperlipidemia, known carotid disease (refused statins and CEA in the past)  who was recently hospitalized July 10-19th for after presenting with blurred vision and hypertensive emergency found to have a small ischemic infarct of the thalamus was discharged on dual antiplatelet therapy. On 8/6 at 2:30 PM she got up off  the toilet and noticed that her right hand and  right leg were weak.  She required assistance to get of the bathroom and to a chair. Her right leg improved somewhat but her arm remained weak and therefore she presented to the emergency department.  In the emergency department an MRI revealed an embolic appearing stroke on the left.  Small acute infarct at the left frontoparietal junction. Additional small focus of acute ischemia adjacent to the frontal horn of the left lateral ventricle with petechial hemorrhage. Source appears embolic in the setting of left sided carotid stenosis of 80%.   Code Stroke CT head No acute abnormality. Small vessel disease with Atrophy was noted.   CTA head & neck: No LVO. Atherosclerotic disease at both carotid bifurcations. No measurable stenosis on the right. 75% stenosis of the proximal ICA on the  left. Intracranial vessels show distal vessel atherosclerotic narrowing and irregularity. No correctable proximal stenosis. MRI   Small acute infarct at the left frontoparietal junction. Additional small focus of acute ischemia adjacent to the frontal horn of the left lateral ventricle with petechial hemorrhage. Multiple old small vessel infarcts and findings of chronic ischemic microangiopathy. 2D Echo performed 03/06/21: Difficult study, EF 55-60%, +mild LVH, Grade 1 diastolic dysfunction, No thrombus, wall motion abnormality or shunt found.   LDL 78 HgbA1c 6.5 VTE prophylaxis - recommended     Diet   Diet NPO time specified   On DAPT with ASA 81mg  prior to admission. Continue DAPT therapy.  Therapy recommendations:  SNF Disposition:  SNF pending   CAS 75% stenosis of the proximal ICA on the left. Vascular surgery, Dr. , consulted and planning TCAR 8/11.   Hypertension Home meds:  Norvasc, microzie Recent hx of hypertensive crisis last month  Stable Permissive hypertension (OK if < 220/120) but gradually normalize in 5-7 days (perioperative plan per vascular surgery) Long-term BP goal normotensive Close PCP follow up  Hyperlipidemia Home meds:  Lipitor 80mg  LDL 78, almost at goal < 70 Continue high intensity statin at discharge  Diabetes type II Controlled Home meds:  None HgbA1c 6.5, goal < 7.0 CBGs Recent Labs    04/02/21 0106 04/02/21 0421 04/02/21 0803  GLUCAP 132* 104* 123*   Other Stroke Risk Factors Advanced Age  Former Cigarette smoker, quit 33 years ago with 20 year pack history  Obesity, Body mass index is 32.12 kg/m., BMI >/= 30 associated with increased stroke risk, recommend weight loss, diet and exercise as appropriate  Hx stroke/TIA Family hx stroke CAD OSA not on CPAP  Other Active Problems   Hospital day # 0  Delila A Bailey-Modzik, NP-C   To contact Stroke Continuity provider, please refer to 06/02/21. After hours, contact  General Neurology

## 2021-04-03 LAB — GLUCOSE, CAPILLARY
Glucose-Capillary: 103 mg/dL — ABNORMAL HIGH (ref 70–99)
Glucose-Capillary: 105 mg/dL — ABNORMAL HIGH (ref 70–99)
Glucose-Capillary: 120 mg/dL — ABNORMAL HIGH (ref 70–99)
Glucose-Capillary: 123 mg/dL — ABNORMAL HIGH (ref 70–99)
Glucose-Capillary: 149 mg/dL — ABNORMAL HIGH (ref 70–99)

## 2021-04-03 LAB — MAGNESIUM: Magnesium: 1.9 mg/dL (ref 1.7–2.4)

## 2021-04-03 MED ORDER — POTASSIUM CHLORIDE 20 MEQ PO PACK
40.0000 meq | PACK | Freq: Once | ORAL | Status: AC
  Administered 2021-04-03: 40 meq via ORAL
  Filled 2021-04-03: qty 2

## 2021-04-03 NOTE — Progress Notes (Signed)
PROGRESS NOTE    Jade [name]  ZMO:[id number] DOB: 1955-10-09 DOA: 04/01/2021 PCP: System, Provider Not In   Brief Narrative: Jade [name] is a 65 y.o. female with a history of recent CVA, COPD, CKD, hyperlipidemia, hypertension, obesity, diabetes mellitus type 2, diabetic retinopathy.  Patient presented secondary to numbness and weakness of her right hand and was found to have evidence of an acute left posterior MCA branch CVA likely related to history of severe left ICA stenosis.  Vascular surgery consulted for consideration of CEA versus TCAR.   Assessment & Plan:   Active Problems:   Diabetes mellitus type 2 with neurological manifestations (HCC)   Pure hypercholesterolemia   Essential hypertension, benign   Acute CVA (cerebrovascular accident) (HCC)   Obesity, Class III, BMI 40-49.9 (morbid obesity) (HCC)   TIA (transient ischemic attack)   Carotid artery stenosis with cerebral infarction [facility])   COPD (chronic obstructive pulmonary disease) (HCC)   Acute CVA Recent admission for acute CVA.  MRI on admission significant for left posterior MCA branch infarct in setting of left ICA stenosis from CTA 2 weeks ago.  Neurology consulted and have recommended for vascular surgery consult for consideration of left CEA versus TCAR.  CTA head and neck ordered and significant for severe left ICA stenosis measured at over 80%. PT/OT recommending SNF at this time -Neurology recommendations: Aspirin/Plavix  Left ICA stenosis Vascular surgery consulted in setting of above. Vascular surgery recommendations: Plan for left TCAR on 8/11  Hyperlipidemia -Continue Lipitor  Primary pretension Currently uncontrolled, but in setting of acute CVA allowing for permissive hypertension pending neurology recommendations.  Patient is on amlodipine and hydrochlorothiazide as an outpatient. -Permissive hypertension per neurology recommendations  Diabetes mellitus, type II Diabetic  retinopathy Patient is on diet control as an outpatient.  Hemoglobin A1c is 6.5%.  With regard to retinopathy, patient currently follows with ophthalmology as an outpatient. -Continue sliding scale insulin  COPD Stable.  No wheezing. -Continue albuterol as needed  Obesity Body mass index is 32.12 kg/m.   DVT prophylaxis: Aspirin/Plavix/SCDs Code Status:   Code Status: DNR Family Communication: None at bedside Disposition Plan: Discharge possibly to SNF pending continued PT recommendations in several days pending vascular surgery management/intervention   Consultants:  Neurology Vascular surgery  Procedures:  None  Antimicrobials: None   Subjective: No issues overnight. No improvement in right arm. Feels her speech has worsened a bit.  Objective: Vitals:   04/02/21 2000 04/02/21 2009 04/02/21 2335 04/03/21 0405  BP: (!) 165/85 (!) 165/85 (!) 192/83 (!) 183/70  Pulse:  93 74 70  Resp:  (!) 22 18 15   Temp:  99.2 F (37.3 C)  97.7 F (36.5 C)  TempSrc:  Oral  Oral  SpO2: 93% 93% 97% 96%  Weight:      Height:        Intake/Output Summary (Last 24 hours) at 04/03/2021 0957 Last data filed at 04/02/2021 2011 Gross per 24 hour  Intake 1000 ml  Output --  Net 1000 ml   Filed Weights   04/01/21 2010  Weight: 77.1 kg    Examination:  General exam: Appears calm and comfortable Respiratory system: Clear to auscultation. Respiratory effort normal. Cardiovascular system: S1 & S2 heard, RRR. No murmurs, rubs, gallops or clicks. Gastrointestinal system: Abdomen is nondistended, soft and nontender. No organomegaly or masses felt. Normal bowel sounds heard. Central nervous system: Alert and oriented. 4/5 RLE vs 5/5 on left. 1/5 RUE compared to 5/5 on left. Musculoskeletal:  No edema. No calf tenderness Skin: No cyanosis. No rashes Psychiatry: Judgement and insight appear normal. Mood & affect appropriate.    Data Reviewed: I have personally reviewed following labs and  imaging studies  CBC Lab Results  Component Value Date   WBC 10.5 04/01/2021   RBC 4.61 04/01/2021   HGB 15.0 04/01/2021   HCT 43.2 04/01/2021   MCV 93.7 04/01/2021   MCH 32.5 04/01/2021   PLT 365 04/01/2021   MCHC 34.7 04/01/2021   RDW 12.7 04/01/2021   LYMPHSABS 1.7 04/01/2021   MONOABS 0.8 04/01/2021   EOSABS 0.1 04/01/2021   BASOSABS 0.1 04/01/2021     Last metabolic panel Lab Results  Component Value Date   NA 139 04/01/2021   K 3.3 (L) 04/01/2021   CL 106 04/01/2021   CO2 22 04/01/2021   BUN 21 04/01/2021   CREATININE 0.96 04/01/2021   GLUCOSE 168 (H) 04/01/2021   GFRNONAA >60 04/01/2021   GFRAA >60 03/23/2018   CALCIUM 10.0 04/01/2021   PHOS 3.1 04/02/2021   PROT 7.1 04/01/2021   ALBUMIN 4.0 04/01/2021   BILITOT 1.0 04/01/2021   ALKPHOS 75 04/01/2021   AST 23 04/01/2021   ALT 17 04/01/2021   ANIONGAP 11 04/01/2021    CBG (last 3)  Recent Labs    04/03/21 0018 04/03/21 0409 04/03/21 0749  GLUCAP 123* 103* 105*      GFR: Estimated Creatinine Clearance: 54.1 mL/min (by C-G formula based on SCr of 0.96 mg/dL).  Coagulation Profile: Recent Labs  Lab 04/01/21 2022  INR 1.0     Recent Results (from the past 240 hour(s))  SARS CORONAVIRUS 2 (TAT 6-24 HRS) Nasopharyngeal Nasopharyngeal Swab     Status: None   Collection Time: 04/02/21 12:49 AM   Specimen: Nasopharyngeal Swab  Result Value Ref Range Status   SARS Coronavirus 2 NEGATIVE NEGATIVE Final    Comment: (NOTE) SARS-CoV-2 target nucleic acids are NOT DETECTED.  The SARS-CoV-2 RNA is generally detectable in upper and lower respiratory specimens during the acute phase of infection. Negative results do not preclude SARS-CoV-2 infection, do not rule out co-infections with other pathogens, and should not be used as the sole basis for treatment or other patient management decisions. Negative results must be combined with clinical observations, patient history, and epidemiological  information. The expected result is Negative.  Fact Sheet for Patients: HairSlick.no  Fact Sheet for Healthcare Providers: quierodirigir.com  This test is not yet approved or cleared by the [place] FDA and  has been authorized for detection and/or diagnosis of SARS-CoV-2 by FDA under an Emergency Use Authorization (EUA). This EUA will remain  in effect (meaning this test can be used) for the duration of the COVID-19 declaration under Se ction 564(b)(1) of the Act, 21 U.S.C. section 360bbb-3(b)(1), unless the authorization is terminated or revoked sooner.  Performed at [facility], [address], [place], [place] [address]          Radiology Studies: CT ANGIO HEAD NECK W WO CM  Result Date: 04/02/2021 CLINICAL DATA:  Stroke workup EXAM: CT ANGIOGRAPHY HEAD AND NECK TECHNIQUE: Multidetector CT imaging of the head and neck was performed using the standard protocol during bolus administration of intravenous contrast. Multiplanar CT image reconstructions and MIPs were obtained to evaluate the vascular anatomy. Carotid stenosis measurements (when applicable) are obtained utilizing NASCET criteria, using the distal internal carotid diameter as the denominator. CONTRAST:  OMNIPAQUE IOHEXOL 350 MG/ML SOLN COMPARISON:  Brain MRI from  yesterday FINDINGS: CTA NECK FINDINGS Aortic arch: Atheromatous plaque.  Three vessel branching. Right carotid system: Moderately tortuous vessels. Calcified plaque at the ICA bulb causing 40% stenosis. No ulceration. Left carotid system: Calcified plaque at the bifurcation with high-grade stenosis at the ICA origin, at least 80%. No dissection or ulceration. Vertebral arteries: Proximal subclavian atherosclerosis without flow limiting stenosis or ulceration. Right V1 atheromatous plaque without significant stenosis. Skeleton: Bridging thoracic syndesmophytes. Other neck: No acute finding. Upper  chest: No acute finding Review of the MIP images confirms the above findings CTA HEAD FINDINGS Anterior circulation: Atheromatous plaque on the carotid siphons. Highly attenuated left M3 branch. Suspect diffuse mild atheromatous change to medium size branches. No proximal branch occlusion or beading. Negative for aneurysm Posterior circulation: V4 segment atherosclerosis on the right. No flow limiting stenosis of vertebral and basilar arteries. High-grade bilateral P2 to P3 segment stenosis. Negative for aneurysm Venous sinuses: Unremarkable in the arterial phase. Anatomic variants: None significant Review of the MIP images confirms the above findings IMPRESSION: 1. Severe left ICA origin stenosis due to calcified plaque, over 80%. 2. 40% narrowing at the right ICA origin. 3. Intracranial atherosclerosis most notably causing advanced bilateral PCA stenosis. 4. High-grade high-grade left M3 branch stenosis which could be related to atherosclerosis or recent embolic disease. Electronically Signed   By: Marnee Spring M.D.   On: 04/02/2021 06:34   CT HEAD WO CONTRAST  Result Date: 04/01/2021 CLINICAL DATA:  Right side numbness EXAM: CT HEAD WITHOUT CONTRAST TECHNIQUE: Contiguous axial images were obtained from the base of the skull through the vertex without intravenous contrast. COMPARISON:  03/06/2021 FINDINGS: Brain: There is atrophy and chronic small vessel disease changes. No acute intracranial abnormality. Specifically, no hemorrhage, hydrocephalus, mass lesion, acute infarction, or significant intracranial injury. Vascular: No hyperdense vessel or unexpected calcification. Skull: No acute calvarial abnormality. Sinuses/Orbits: No acute findings Other: None IMPRESSION: Atrophy, chronic microvascular disease. No acute intracranial abnormality. Electronically Signed   By: Charlett Nose M.D.   On: 04/01/2021 21:50   MR BRAIN WO CONTRAST  Result Date: 04/01/2021 CLINICAL DATA:  Right-sided weakness EXAM: MRI  HEAD WITHOUT CONTRAST TECHNIQUE: Multiplanar, multiecho pulse sequences of the brain and surrounding structures were obtained without intravenous contrast. COMPARISON:  None. FINDINGS: Brain: Small acute infarct at the left frontoparietal junction. Additional small focus acute ischemia adjacent to the frontal horn of the left lateral ventricle with associated magnetic susceptibility effect, possibly petechial hemorrhage. Right middle cranial fossa arachnoid cyst. There is multifocal hyperintense T2-weighted signal within the white matter. Generalized volume loss without a clear lobar predilection. Multiple old small vessel infarcts of the gray nuclei. Vascular: Major flow voids are preserved. Skull and upper cervical spine: Normal calvarium and skull base. Visualized upper cervical spine and soft tissues are normal. Sinuses/Orbits:No paranasal sinus fluid levels or advanced mucosal thickening. No mastoid or middle ear effusion. Normal orbits. IMPRESSION: 1. Small acute infarct at the left frontoparietal junction. Additional small focus of acute ischemia adjacent to the frontal horn of the left lateral ventricle with petechial hemorrhage. 2. Multiple old small vessel infarcts and findings of chronic ischemic microangiopathy. Electronically Signed   By: Deatra Robinson M.D.   On: 04/01/2021 23:43   DG CHEST PORT 1 VIEW  Result Date: 04/02/2021 CLINICAL DATA:  Right-sided numbness and history of prior strokes EXAM: PORTABLE CHEST 1 VIEW COMPARISON:  03/05/2021 FINDINGS: Cardiac shadow is stable. Aortic calcifications are again seen. Patient is rotated to the right accentuating the mediastinal markings. Lungs  are clear. No bony abnormality is noted. IMPRESSION: No active disease. Electronically Signed   By: Alcide Clever M.D.   On: 04/02/2021 01:00        Scheduled Meds:   stroke: mapping our early stages of recovery book   Does not apply Once   aspirin EC  81 mg Oral Daily   atorvastatin  80 mg Oral Daily    clopidogrel  75 mg Oral Daily   insulin aspart  0-9 Units Subcutaneous Q4H   Continuous Infusions:  sodium chloride 75 mL/hr at 04/03/21 0515     LOS: 1 day     Jacquelin Hawking, MD [facility] 04/03/2021, 9:57 AM  If 7PM-7AM, please contact night-coverage [email or web address]

## 2021-04-03 NOTE — Plan of Care (Signed)

## 2021-04-03 NOTE — Plan of Care (Signed)
  Problem: Education: Goal: Knowledge of disease or condition will improve Outcome: Progressing Goal: Knowledge of secondary prevention will improve Outcome: Progressing Goal: Knowledge of patient specific risk factors addressed and post discharge goals established will improve Outcome: Progressing Goal: Individualized Educational Video(s) Outcome: Progressing   

## 2021-04-03 NOTE — Progress Notes (Signed)
Patient sleeping comfortably.  Plan L TCAR Thursday 04/06/21.  Rande Brunt. Lenell Antu, MD [facility] Phone Number: [phone] 04/03/2021 12:49 PM

## 2021-04-04 LAB — GLUCOSE, CAPILLARY
Glucose-Capillary: 112 mg/dL — ABNORMAL HIGH (ref 70–99)
Glucose-Capillary: 118 mg/dL — ABNORMAL HIGH (ref 70–99)
Glucose-Capillary: 119 mg/dL — ABNORMAL HIGH (ref 70–99)
Glucose-Capillary: 121 mg/dL — ABNORMAL HIGH (ref 70–99)
Glucose-Capillary: 122 mg/dL — ABNORMAL HIGH (ref 70–99)
Glucose-Capillary: 124 mg/dL — ABNORMAL HIGH (ref 70–99)
Glucose-Capillary: 166 mg/dL — ABNORMAL HIGH (ref 70–99)

## 2021-04-04 NOTE — Progress Notes (Signed)
Patient asked if I would pull her up I told her that I could and she said no never mind I will get my son to pull me up. Another tech and me was going to pull her up.

## 2021-04-04 NOTE — Progress Notes (Signed)
[facility] [name]  ASSESSMENT / PLAN: Jade [name] is a 65 y.o. female with symptomatic left carotid artery stenosis. For left TCAR Thursday 04/06/21. Continue ASA / Plavix / Statin.    SUBJECTIVE: No interval change.  OBJECTIVE: BP (!) 191/78 (BP Location: Left Leg)   Pulse 70   Temp 97.7 F (36.5 C) (Oral)   Resp 15   Ht 5\' 1"  (1.549 m)   Wt 78.1 kg   SpO2 96%   BMI 32.53 kg/m   Intake/Output Summary (Last 24 hours) at 04/04/2021 1909 Last data filed at 04/04/2021 06/04/2021 Gross per 24 hour  Intake --  Output 600 ml  Net -600 ml    No acute distress Regular rate and rhythm Unlabored No change in neurologic exam  CBC Latest Ref Rng & Units 04/01/2021 03/12/2021 03/10/2021  WBC 4.0 - 10.5 K/uL 10.5 9.3 9.0  Hemoglobin 12.0 - 15.0 g/dL 03/12/2021 34.1 93.7  Hematocrit 36.0 - 46.0 % 43.2 43.2 42.2  Platelets 150 - 400 K/uL 365 296 283     CMP Latest Ref Rng & Units 04/01/2021 03/12/2021 03/09/2021  Glucose 70 - 99 mg/dL 03/11/2021) 409(B) 353(G)  BUN 8 - 23 mg/dL 21 992(E) 21  Creatinine 0.44 - 1.00 mg/dL 26(S 3.41) 9.62(I)  Sodium 135 - 145 mmol/L 139 137 141  Potassium 3.5 - 5.1 mmol/L 3.3(L) 3.6 3.7  Chloride 98 - 111 mmol/L 106 104 107  CO2 22 - 32 mmol/L 22 24 25   Calcium 8.9 - 10.3 mg/dL 2.97(L 9.6 9.5  Total Protein 6.5 - 8.1 g/dL 7.1 6.5 -  Total Bilirubin 0.3 - 1.2 mg/dL 1.0 0.8 -  Alkaline Phos 38 - 126 U/L 75 65 -  AST 15 - 41 U/L 23 25 -  ALT 0 - 44 U/L 17 23 -    Estimated Creatinine Clearance: 54.5 mL/min (by C-G formula based on SCr of 0.96 mg/dL).  . 89.2, MD [facility] Phone Number: [phone] 04/04/2021 7:09 PM

## 2021-04-04 NOTE — Progress Notes (Signed)
CSW went by room to see patient but she was sleeping. Will return at a later time.  Joaquin Courts, MSW, [place]

## 2021-04-04 NOTE — Progress Notes (Signed)
Physical Therapy Treatment Patient Details Name: Jade [name] MRN: [id number] DOB: 04/06/1956 Today's Date: 04/04/2021    History of Present Illness Pt is a 65 y.o. female who presented 04/01/21 with R-sided weakness. MRI reveals an embolic appearing stroke on the left, affecting a posterior MCA branch. Imaging revealed severe L ICA origin stenosis. Of note, a few weeks ago pt had stroke affecting her vision and R-hand strength, MRI then revealed acute L thalamic , anterior left frontal and possible right frontal and multiple remote lacunar infarcts. PMH: arthritis, CKD, COPD, CVA, fibromyalgia, gout, HTN, migraine, OSA, DM2    PT Comments    Pt progressing towards goals. Improved sitting balance noted this session and was able to perform dynamic sitting balance tasks with min guard to min A. Required mod A +2 for bed mobility and mod to max A +2 to stand X3 and take side steps at EOB. Continues to present with R sided weakness and required physical assist to move RLE during mobility tasks. Current recommendations appropriate. Will continue to follow acutely.     Follow Up Recommendations  SNF;Supervision/Assistance - 24 hour     Equipment Recommendations  Wheelchair (measurements PT);Wheelchair cushion (measurements PT);Hospital bed    Recommendations for Other Services       Precautions / Restrictions Precautions Precautions: Fall Restrictions Weight Bearing Restrictions: No    Mobility  Bed Mobility Overal bed mobility: Needs Assistance Bed Mobility: Supine to Sit;Sit to Supine     Supine to sit: Mod assist;+2 for physical assistance Sit to supine: Mod assist;+2 for physical assistance   General bed mobility comments: Mod A +2 for trunk assist and RLE assist. Also requiring assist to scoot hips to EOB.    Transfers Overall transfer level: Needs assistance Equipment used: 1 person hand held assist Transfers: Sit to/from Stand Sit to Stand: Mod assist;Max assist;+2  physical assistance         General transfer comment: Mod to max A +2 to stand X3 this session. Required manual assist to prevent buckling in R knee.  Ambulation/Gait Ambulation/Gait assistance: Max assist;+2 physical assistance   Assistive device: 1 person hand held assist       General Gait Details: Was able to take side steps at EOB with max A +2. Pt requiring assist to weightshift and block RLE during stance phase and manual assist to move RLE when taking step.   Stairs             Wheelchair Mobility    Modified Rankin (Stroke Patients Only) Modified Rankin (Stroke Patients Only) Pre-Morbid Rankin Score: Moderate disability Modified Rankin: Severe disability     Balance Overall balance assessment: Needs assistance Sitting-balance support: Single extremity supported Sitting balance-Leahy Scale: Poor Sitting balance - Comments: min to min guard A to maintain sitting balance.   Standing balance support: Single extremity supported Standing balance-Leahy Scale: Zero Standing balance comment: max A +2 and LUE support to maintain standing balance.                            Cognition Arousal/Alertness: Awake/alert Behavior During Therapy: WFL for tasks assessed/performed Overall Cognitive Status: Within Functional Limits for tasks assessed                                        Exercises Other Exercises Other Exercises: Sitting balance reaching exercises  using LUE X10. Other Exercises: Weightshifting in standing X4-5 each side with max A +2.    General Comments        Pertinent Vitals/Pain Pain Assessment: No/denies pain    Home Living Family/patient expects to be discharged to:: Skilled nursing facility                    Prior Function            PT Goals (current goals can now be found in the care plan section) Acute Rehab PT Goals Patient Stated Goal: to regain independence PT Goal Formulation: With  patient Time For Goal Achievement: 04/16/21 Potential to Achieve Goals: Good Progress towards PT goals: Progressing toward goals    Frequency    Min 3X/week      PT Plan Current plan remains appropriate    Co-evaluation              AM-PAC PT "6 Clicks" Mobility   Outcome Measure  Help needed turning from your back to your side while in a flat bed without using bedrails?: A Lot Help needed moving from lying on your back to sitting on the side of a flat bed without using bedrails?: Total Help needed moving to and from a bed to a chair (including a wheelchair)?: Total Help needed standing up from a chair using your arms (e.g., wheelchair or bedside chair)?: Total Help needed to walk in hospital room?: Total Help needed climbing 3-5 steps with a railing? : Total 6 Click Score: 7    End of Session Equipment Utilized During Treatment: Gait belt Activity Tolerance: Patient tolerated treatment well Patient left: in bed;with call bell/phone within reach;with bed alarm set Nurse Communication: Mobility status PT Visit Diagnosis: Other abnormalities of gait and mobility (R26.89);Hemiplegia and hemiparesis;Unsteadiness on feet (R26.81);Muscle weakness (generalized) (M62.81);Difficulty in walking, not elsewhere classified (R26.2);Apraxia (R48.2);Other symptoms and signs involving the nervous system (R29.898) Hemiplegia - Right/Left: Right Hemiplegia - dominant/non-dominant: Dominant Hemiplegia - caused by: Cerebral infarction     Time: 1420-1445 PT Time Calculation (min) (ACUTE ONLY): 25 min  Charges:  $Therapeutic Activity: 23-37 mins                     Cindee Salt, DPT  Acute Rehabilitation Services  Pager: [phone] Office: [phone]    Lehman Prom 04/04/2021, 3:49 PM

## 2021-04-04 NOTE — Progress Notes (Signed)
PROGRESS NOTE    Jade [name]  MPN:[id number] DOB: 09-30-1955 DOA: 04/01/2021 PCP: System, Provider Not In   Brief Narrative: Jade [name] is a 65 y.o. female with a history of recent CVA, COPD, CKD, hyperlipidemia, hypertension, obesity, diabetes mellitus type 2, diabetic retinopathy.  Patient presented secondary to numbness and weakness of her right hand and was found to have evidence of an acute left posterior MCA branch CVA likely related to history of severe left ICA stenosis.  Vascular surgery consulted for consideration of CEA versus TCAR.   Assessment & Plan:   Active Problems:   Diabetes mellitus type 2 with neurological manifestations (HCC)   Pure hypercholesterolemia   Essential hypertension, benign   Acute CVA (cerebrovascular accident) (HCC)   Obesity, Class III, BMI 40-49.9 (morbid obesity) (HCC)   TIA (transient ischemic attack)   Carotid artery stenosis with cerebral infarction [facility])   COPD (chronic obstructive pulmonary disease) (HCC)   Acute CVA Recent admission for acute CVA.  MRI on admission significant for left posterior MCA branch infarct in setting of left ICA stenosis from CTA 2 weeks ago.  Neurology consulted and have recommended for vascular surgery consult for consideration of left CEA versus TCAR.  CTA head and neck ordered and significant for severe left ICA stenosis measured at over 80%. PT/OT recommending SNF at this time -Neurology recommendations: Aspirin/Plavix. Signed off.  Left ICA stenosis Vascular surgery consulted in setting of above. Vascular surgery recommendations: Plan for left TCAR on 8/11  Hyperlipidemia -Continue Lipitor  Primary pretension Currently uncontrolled, but in setting of acute CVA allowing for permissive hypertension pending neurology recommendations.  Patient is on amlodipine and hydrochlorothiazide as an outpatient. -Permissive hypertension per neurology recommendations, gradually normalize in 5-7 days from  admission  Diabetes mellitus, type II Diabetic retinopathy Patient is on diet control as an outpatient.  Hemoglobin A1c is 6.5%.  With regard to retinopathy, patient currently follows with ophthalmology as an outpatient. -Continue sliding scale insulin  COPD Stable.  No wheezing. -Continue albuterol as needed  Obesity Body mass index is 32.53 kg/m.   DVT prophylaxis: Aspirin/Plavix/SCDs Code Status:   Code Status: DNR Family Communication: None at bedside Disposition Plan: Discharge possibly to SNF pending continued PT recommendations in several days pending vascular surgery management/intervention   Consultants:  Neurology Vascular surgery  Procedures:  None  Antimicrobials: None   Subjective: No issues noted overnight.  Objective: Vitals:   04/02/21 2335 04/03/21 0405 04/03/21 2111 04/04/21 0500  BP: (!) 192/83 (!) 183/70 (!) 191/78   Pulse: 74 70    Resp: 18 15    Temp:  97.7 F (36.5 C) 98.2 F (36.8 C) 97.7 F (36.5 C)  TempSrc:  Oral Oral Oral  SpO2: 97% 96%    Weight:    78.1 kg  Height:        Intake/Output Summary (Last 24 hours) at 04/04/2021 1407 Last data filed at 04/04/2021 0552 Gross per 24 hour  Intake --  Output 600 ml  Net -600 ml    Filed Weights   04/01/21 2010 04/04/21 0500  Weight: 77.1 kg 78.1 kg    Examination:  General: Well appearing, no distress. Asleep   Data Reviewed: I have personally reviewed following labs and imaging studies  CBC Lab Results  Component Value Date   WBC 10.5 04/01/2021   RBC 4.61 04/01/2021   HGB 15.0 04/01/2021   HCT 43.2 04/01/2021   MCV 93.7 04/01/2021   MCH 32.5 04/01/2021  PLT 365 04/01/2021   MCHC 34.7 04/01/2021   RDW 12.7 04/01/2021   LYMPHSABS 1.7 04/01/2021   MONOABS 0.8 04/01/2021   EOSABS 0.1 04/01/2021   BASOSABS 0.1 04/01/2021     Last metabolic panel Lab Results  Component Value Date   NA 139 04/01/2021   K 3.3 (L) 04/01/2021   CL 106 04/01/2021   CO2 22  04/01/2021   BUN 21 04/01/2021   CREATININE 0.96 04/01/2021   GLUCOSE 168 (H) 04/01/2021   GFRNONAA >60 04/01/2021   GFRAA >60 03/23/2018   CALCIUM 10.0 04/01/2021   PHOS 3.1 04/02/2021   PROT 7.1 04/01/2021   ALBUMIN 4.0 04/01/2021   BILITOT 1.0 04/01/2021   ALKPHOS 75 04/01/2021   AST 23 04/01/2021   ALT 17 04/01/2021   ANIONGAP 11 04/01/2021    CBG (last 3)  Recent Labs    04/04/21 0547 04/04/21 0749 04/04/21 1207  GLUCAP 122* 124* 119*      GFR: Estimated Creatinine Clearance: 54.5 mL/min (by C-G formula based on SCr of 0.96 mg/dL).  Coagulation Profile: Recent Labs  Lab 04/01/21 2022  INR 1.0     Recent Results (from the past 240 hour(s))  SARS CORONAVIRUS 2 (TAT 6-24 HRS) Nasopharyngeal Nasopharyngeal Swab     Status: None   Collection Time: 04/02/21 12:49 AM   Specimen: Nasopharyngeal Swab  Result Value Ref Range Status   SARS Coronavirus 2 NEGATIVE NEGATIVE Final    Comment: (NOTE) SARS-CoV-2 target nucleic acids are NOT DETECTED.  The SARS-CoV-2 RNA is generally detectable in upper and lower respiratory specimens during the acute phase of infection. Negative results do not preclude SARS-CoV-2 infection, do not rule out co-infections with other pathogens, and should not be used as the sole basis for treatment or other patient management decisions. Negative results must be combined with clinical observations, patient history, and epidemiological information. The expected result is Negative.  Fact Sheet for Patients: HairSlick.no  Fact Sheet for Healthcare Providers: quierodirigir.com  This test is not yet approved or cleared by the [place] FDA and  has been authorized for detection and/or diagnosis of SARS-CoV-2 by FDA under an Emergency Use Authorization (EUA). This EUA will remain  in effect (meaning this test can be used) for the duration of the COVID-19 declaration under Se ction  564(b)(1) of the Act, 21 U.S.C. section 360bbb-3(b)(1), unless the authorization is terminated or revoked sooner.  Performed at [facility], [address] [place], [place] [address]          Radiology Studies: No results found.      Scheduled Meds:   stroke: mapping our early stages of recovery book   Does not apply Once   aspirin EC  81 mg Oral Daily   atorvastatin  80 mg Oral Daily   clopidogrel  75 mg Oral Daily   insulin aspart  0-9 Units Subcutaneous Q4H   Continuous Infusions:     LOS: 2 days     Jacquelin Hawking, MD [facility] 04/04/2021, 2:07 PM  If 7PM-7AM, please contact night-coverage [email or web address]

## 2021-04-05 ENCOUNTER — Ambulatory Visit: Payer: Self-pay | Admitting: *Deleted

## 2021-04-05 LAB — GLUCOSE, CAPILLARY
Glucose-Capillary: 109 mg/dL — ABNORMAL HIGH (ref 70–99)
Glucose-Capillary: 123 mg/dL — ABNORMAL HIGH (ref 70–99)
Glucose-Capillary: 134 mg/dL — ABNORMAL HIGH (ref 70–99)
Glucose-Capillary: 124 mg/dL — ABNORMAL HIGH (ref 70–99)
Glucose-Capillary: 244 mg/dL — ABNORMAL HIGH (ref 70–99)
Glucose-Capillary: 105 mg/dL — ABNORMAL HIGH (ref 70–99)

## 2021-04-05 MED ORDER — CEFAZOLIN SODIUM-DEXTROSE 1-4 GM/50ML-% IV SOLN
1.0000 g | INTRAVENOUS | Status: AC
  Administered 2021-04-06: 2 g via INTRAVENOUS
  Filled 2021-04-05 (×2): qty 50

## 2021-04-05 NOTE — Progress Notes (Signed)
PROGRESS NOTE  [name] QBH:[id number] DOB: [birth year] DOA: 04/01/2021 PCP: System, Provider Not In   LOS: 3 days   Brief narrative:  Jade [name] is a 65 y.o. female with a history of recent CVA, COPD, CKD, hyperlipidemia, hypertension, obesity, diabetes mellitus type 2 presented to hospital with numbness and weakness of the right hand and was noted to have acute left posterior MCA branch CVA likely secondary to severe left ICA stenosis.  Vascular surgery was consulted and plan for  TCAR on 04/06/2021  Assessment/Plan:  Principal Problem:   Acute CVA (cerebrovascular accident) [facility]) Active Problems:   Diabetes mellitus type 2 with neurological manifestations (HCC)   Pure hypercholesterolemia   Essential hypertension, benign   Obesity, Class III, BMI 40-49.9 (morbid obesity) (HCC)   TIA (transient ischemic attack)   Carotid artery stenosis with cerebral infarction (HCC)   COPD (chronic obstructive pulmonary disease) (HCC)   Acute CVA History of recent CVA.  Patient did have left ICA stenosis more than 80%.  Neurology on board.  Continue aspirin/Plavix   Left ICA stenosis Vascular surgery on board.  Plan forTCAR on 8/11   Hyperlipidemia Continue Lipitor  Essential hypertension Was on permissive hypertension, on amlodipine and hydrochlorothiazide as an outpatient.   Diabetes mellitus, type II with diabetic retinopathy Diet controlled at home.  Hemoglobin A1c was 6.5.  Patient follows with ophthalmology as an outpatient for diabetic retinopathy.  Continue diabetic diet, sliding scale insulin   COPD As needed albuterol   Obesity Body mass index is 32.12 kg/m.  Would benefit from weight loss as outpatient  DVT prophylaxis: SCD's Start: 04/02/21 0208   Code Status: DNR  Family Communication: None  Status is: Inpatient  Remains inpatient appropriate because:Ongoing diagnostic testing needed not appropriate for outpatient work up, IV treatments appropriate  due to intensity of illness or inability to take PO, Inpatient level of care appropriate due to severity of illness, and plan for surgical intervention  Dispo: The patient is from: Home              Anticipated d/c is to: SNF as per PT recommendation.              Patient currently is not medically stable to d/c.   Difficult to place patient No  Consultants: Neurology Vascular surgery  Procedures: None yet  Anti-infectives:  None  Anti-infectives (From admission, onward)    None       Subjective: Today, patient was seen and examined at bedside.  Patient states that she slept better.  Has a right-sided weakness.  Objective: Vitals:   04/04/21 2021 04/05/21 0609  BP: (!) 189/80 (!) 185/64  Pulse:    Resp:    Temp: 99.4 F (37.4 C) 98.7 F (37.1 C)  SpO2:      Intake/Output Summary (Last 24 hours) at 04/05/2021 0714 Last data filed at 04/04/2021 2021 Gross per 24 hour  Intake --  Output 450 ml  Net -450 ml   Filed Weights   04/01/21 2010 04/04/21 0500  Weight: 77.1 kg 78.1 kg   Body mass index is 32.53 kg/m.   Physical Exam: GENERAL: Patient is alert awake and oriented. Not in obvious distress.  Obese HENT: No scleral pallor or icterus. Pupils equally reactive to light. Oral mucosa is moist NECK: is supple, no gross swelling noted. CHEST: Clear to auscultation. No crackles or wheezes.  Diminished breath sounds bilaterally. CVS: S1 and S2 heard, no murmur. Regular rate and rhythm.  ABDOMEN:  Soft, non-tender, bowel sounds are present. EXTREMITIES: No edema. CNS: Cranial nerves are intact.  Right-sided weakness. SKIN: warm and dry without rashes.  Data Review: I have personally reviewed the following laboratory data and studies,  CBC: Recent Labs  Lab 04/01/21 2022  WBC 10.5  NEUTROABS 7.8*  HGB 15.0  HCT 43.2  MCV 93.7  PLT 365   Basic Metabolic Panel: Recent Labs  Lab 04/01/21 2022 04/02/21 0411 04/03/21 2001  NA 139  --   --   K 3.3*  --    --   CL 106  --   --   CO2 22  --   --   GLUCOSE 168*  --   --   BUN 21  --   --   CREATININE 0.96  --   --   CALCIUM 10.0  --   --   MG  --  2.0 1.9  PHOS  --  3.1  --    Liver Function Tests: Recent Labs  Lab 04/01/21 2022  AST 23  ALT 17  ALKPHOS 75  BILITOT 1.0  PROT 7.1  ALBUMIN 4.0   No results for input(s): LIPASE, AMYLASE in the last 168 hours. No results for input(s): AMMONIA in the last 168 hours. Cardiac Enzymes: No results for input(s): CKTOTAL, CKMB, CKMBINDEX, TROPONINI in the last 168 hours. BNP (last 3 results) No results for input(s): BNP in the last 8760 hours.  ProBNP (last 3 results) No results for input(s): PROBNP in the last 8760 hours.  CBG: Recent Labs  Lab 04/04/21 1207 04/04/21 1608 04/04/21 2019 04/04/21 2356 04/05/21 0357  GLUCAP 119* 121* 166* 112* 109*   Recent Results (from the past 240 hour(s))  SARS CORONAVIRUS 2 (TAT 6-24 HRS) Nasopharyngeal Nasopharyngeal Swab     Status: None   Collection Time: 04/02/21 12:49 AM   Specimen: Nasopharyngeal Swab  Result Value Ref Range Status   SARS Coronavirus 2 NEGATIVE NEGATIVE Final    Comment: (NOTE) SARS-CoV-2 target nucleic acids are NOT DETECTED.  The SARS-CoV-2 RNA is generally detectable in upper and lower respiratory specimens during the acute phase of infection. Negative results do not preclude SARS-CoV-2 infection, do not rule out co-infections with other pathogens, and should not be used as the sole basis for treatment or other patient management decisions. Negative results must be combined with clinical observations, patient history, and epidemiological information. The expected result is Negative.  Fact Sheet for Patients: HairSlick.no  Fact Sheet for Healthcare Providers: quierodirigir.com  This test is not yet approved or cleared by the [place] FDA and  has been authorized for detection and/or diagnosis of  SARS-CoV-2 by FDA under an Emergency Use Authorization (EUA). This EUA will remain  in effect (meaning this test can be used) for the duration of the COVID-19 declaration under Se ction 564(b)(1) of the Act, 21 U.S.C. section 360bbb-3(b)(1), unless the authorization is terminated or revoked sooner.  Performed at [facility], [address], [place], [place] [address]      Studies: No results found.   Joycelyn Das, MD  [facility] 04/05/2021  If 7PM-7AM, please contact night-coverage

## 2021-04-05 NOTE — Progress Notes (Signed)
Occupational Therapy Treatment Patient Details Name: Jade [name] MRN: [id number] DOB: [birth year] Today's Date: 04/05/2021    History of present illness Pt is a 65 y.o. female who presented 04/01/21 with R-sided weakness. MRI reveals an embolic appearing stroke on the left, affecting a posterior MCA branch. Imaging revealed severe L ICA origin stenosis. Of note, a few weeks ago pt had stroke affecting her vision and R-hand strength, MRI then revealed acute L thalamic , anterior left frontal and possible right frontal and multiple remote lacunar infarcts. PMH: arthritis, CKD, COPD, CVA, fibromyalgia, gout, HTN, migraine, OSA, DM2   OT comments  Patient making progress toward goals. Continues to endorse double vision from previous CVA several weeks ago. Patient on bedpan upon entry. Demonstrates ability to roll to R with Min A and to L with Mod A. Patient able to assist with LB dressing at bed level demonstrating ability to bridge in supine with additional assist to maintain position of RLE. Sit to stand with use of Stedy and Min A +2 x2 trials. RLE externally rotates and buckles. Continued RUE flaccidity. Education provided on positioning of RUE to maintain integrity of shoulder joint. Patient motivated to regain strength and independence. Recommendation updated to CIR. OT will continue to follow acutely.    Follow Up Recommendations  CIR    Equipment Recommendations  Other (comment) (Defer to next level of care.)    Recommendations for Other Services Rehab consult    Precautions / Restrictions Precautions Precautions: Fall Precaution Comments: High fall risk;  bilateral vertical gaze palsy; R hemi (UE>LE), R knee externally rotates/buckles Restrictions Weight Bearing Restrictions: No       Mobility Bed Mobility Overal bed mobility: Needs Assistance Bed Mobility: Supine to Sit;Sit to Supine     Supine to sit: Mod assist;+2 for physical assistance Sit to supine: Mod assist;+2 for  physical assistance   General bed mobility comments: Mod A +2 for trunk assist and RLE assist. Also requiring assist to scoot hips to EOB.    Transfers Overall transfer level: Needs assistance   Transfers: Sit to/from Stand Sit to Stand: +2 physical assistance;Min assist;+2 safety/equipment         General transfer comment: Min A +2 for sit to stand from EOB to [facility] with assist to prevent buckling of R knee. Stedy used for transfer to recliner.    Balance Overall balance assessment: Needs assistance Sitting-balance support: Single extremity supported Sitting balance-Leahy Scale: Poor Sitting balance - Comments: min to min guard A to maintain sitting balance.   Standing balance support: Single extremity supported Standing balance-Leahy Scale: Poor Standing balance comment: Able to maintain static standing in stedy with Min A at RLE (externally rotates and knee buckles).                           ADL either performed or assessed with clinical judgement   ADL Overall ADL's : Needs assistance/impaired                     Lower Body Dressing: Moderate assistance;Maximal assistance;Bed level Lower Body Dressing Details (indicate cue type and reason): Patient able to lift BLE as therapist assisted with threading legs into mesh panties. Patient then able to bridge in supine with assist to hike over hips. Additional assist required to maintain flexed position of RLE. Toilet Transfer: Maximal assistance;+2 for physical assistance;Stand-pivot;BSC Toilet Transfer Details (indicate cue type and reason): On bedpan upon entry. Max A  at bedlevel with assist for hygiene/clothing management.                 Vision       Perception     Praxis      Cognition Arousal/Alertness: Awake/alert Behavior During Therapy: WFL for tasks assessed/performed Overall Cognitive Status: Within Functional Limits for tasks assessed                                           Exercises Other Exercises Other Exercises: Education provided on positioning of RUE.   Shoulder Instructions       General Comments      Pertinent Vitals/ Pain       Pain Assessment: No/denies pain  Home Living                                          Prior Functioning/Environment              Frequency  Min 2X/week        Progress Toward Goals  OT Goals(current goals can now be found in the care plan section)  Progress towards OT goals: Progressing toward goals  Acute Rehab OT Goals Patient Stated Goal: to regain independence OT Goal Formulation: With patient Time For Goal Achievement: 04/16/21 Potential to Achieve Goals: Good ADL Goals Pt Will Perform Grooming: with min guard assist;standing Pt Will Perform Upper Body Bathing: with min guard assist;sitting Pt Will Perform Lower Body Bathing: with min guard assist;sit to/from stand Pt Will Perform Upper Body Dressing: with min guard assist;sitting Pt Will Perform Lower Body Dressing: with min guard assist;sit to/from stand Pt Will Transfer to Toilet: with min assist;ambulating;bedside commode Pt Will Perform Toileting - Clothing Manipulation and hygiene: with min assist Pt/caregiver will Perform Home Exercise Program: Increased ROM;Increased strength;Right Upper extremity;With written HEP provided;With minimal assist Additional ADL Goal #1: Pt will be minguard A sit<>stand for basic ADLs Additional ADL Goal #2: Pt will be able to locate items on her right without cues  Plan Frequency remains appropriate;Discharge plan needs to be updated    Co-evaluation                 AM-PAC OT "6 Clicks" Daily Activity     Outcome Measure   Help from another person eating meals?: A Little Help from another person taking care of personal grooming?: A Lot Help from another person toileting, which includes using toliet, bedpan, or urinal?: A Lot Help from another person bathing  (including washing, rinsing, drying)?: A Lot Help from another person to put on and taking off regular upper body clothing?: A Lot Help from another person to put on and taking off regular lower body clothing?: Total 6 Click Score: 12    End of Session Equipment Utilized During Treatment: Gait belt;Other (comment) Antony Salmon)  OT Visit Diagnosis: Unsteadiness on feet (R26.81);Other abnormalities of gait and mobility (R26.89);Muscle weakness (generalized) (M62.81);Low vision, both eyes (H54.2);Other symptoms and signs involving cognitive function;Hemiplegia and hemiparesis Hemiplegia - Right/Left: Right Hemiplegia - dominant/non-dominant: Dominant Hemiplegia - caused by: Cerebral infarction   Activity Tolerance Patient tolerated treatment well   Patient Left in chair;with call bell/phone within reach;with chair alarm set   Nurse Communication Mobility status;Other (comment) (Stedy for return to supine)  Time: 7622-6333 OT Time Calculation (min): 29 min  Charges: OT General Charges $OT Visit: 1 Visit OT Treatments $Therapeutic Activity: 8-22 mins $Neuromuscular Re-education: 8-22 mins  Rital Cavey H. OTR/L Supplemental OT, Department of rehab services [phone]   Mechel Haggard R H. 04/05/2021, 1:53 PM

## 2021-04-05 NOTE — TOC Initial Note (Signed)
Transition of Care [facility]) - Initial/Assessment Note    Patient Details  Name: Jade [name] MRN: [id number] Date of Birth: December 01, 1955  Transition of Care [facility]) CM/SW Contact:    Tom-Johnson, Hershal Coria, RN Phone Number: 04/05/2021, 2:50 PM  Clinical Narrative:                 Spoke with patient about transitioning upon discharge. Patient states the facilities she chose on her recent admission will not accept her due to not being vaccinated for Covid. Agreed to be sent out again and awaiting response from the facilities. Will continue to follow up for TOC needs.  Expected Discharge Plan: Skilled Nursing Facility Barriers to Discharge: Continued Medical Work up   Patient Goals and CMS Choice Patient states their goals for this hospitalization and ongoing recovery are:: To get better from rehab and go home. CMS Medicare.gov Compare Post Acute Care list provided to:: Patient Choice offered to / list presented to : Patient  Expected Discharge Plan and Services Expected Discharge Plan: Skilled Nursing Facility In-house Referral: Clinical Social Work Discharge Planning Services: CM Consult Post Acute Care Choice: Skilled Nursing Facility Living arrangements for the past 2 months: Single Family Home                                      Prior Living Arrangements/Services Living arrangements for the past 2 months: Single Family Home Lives with:: Spouse Patient language and need for interpreter reviewed:: Yes Do you feel safe going back to the place where you live?: Yes      Need for Family Participation in Patient Care: Yes (Comment) Care giver support system in place?: Yes (comment)   Criminal Activity/Legal Involvement Pertinent to Current Situation/Hospitalization: No - Comment as needed  Activities of Daily Living Home Assistive Devices/Equipment: None ADL Screening (condition at time of admission) Patient's cognitive ability adequate to safely complete daily  activities?: Yes Is the patient deaf or have difficulty hearing?: No Does the patient have difficulty seeing, even when wearing glasses/contacts?: No Does the patient have difficulty concentrating, remembering, or making decisions?: No Patient able to express need for assistance with ADLs?: Yes Does the patient have difficulty dressing or bathing?: Yes Independently performs ADLs?: No Does the patient have difficulty walking or climbing stairs?: Yes Weakness of Legs: Both Weakness of Arms/Hands: Both  Permission Sought/Granted Permission sought to share information with : Case Manager, Magazine features editor Permission granted to share information with : Yes, Verbal Permission Granted              Emotional Assessment Appearance:: Appears stated age Attitude/Demeanor/Rapport: Engaged Affect (typically observed): Accepting Orientation: : Oriented to Self, Oriented to Place, Oriented to  Time, Oriented to Situation Alcohol / Substance Use: Not Applicable Psych Involvement: No (comment)  Admission diagnosis:  TIA (transient ischemic attack) [G45.9] Stroke (cerebrum) [facility]) [I63.9] Patient Active Problem List   Diagnosis Date Noted   TIA (transient ischemic attack) 04/02/2021   Carotid artery stenosis with cerebral infarction (HCC) 04/02/2021   COPD (chronic obstructive pulmonary disease) (HCC) 04/02/2021   Stroke-like symptoms 03/05/2021   Acute CVA (cerebrovascular accident) (HCC) 03/21/2018   Obesity, Class III, BMI 40-49.9 (morbid obesity) (HCC) 03/21/2018   IBS (irritable bowel syndrome) 04/12/2015   Hair loss 10/20/2014   Primary osteoarthritis of left knee 09/29/2014   Visit for screening mammogram 08/30/2014   Routine general medical examination at a health  care facility 08/30/2014   Tinea corporis 12/21/2013   Essential hypertension, benign 12/21/2013   Low back pain 08/24/2013   Patient noncompliant with statin medication 02/23/2013   H/O abnormal Pap smear  10/24/2012   Osteopenia 09/26/2012   Diabetic neuropathy, painful (HCC) 05/26/2012   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   Pure hypercholesterolemia 03/20/2012   Chronic venous insufficiency 03/20/2012   PCP:  System, Provider Not In Pharmacy:   [facility] [place], [place] - [address] [facility] [address] [place] [address] Phone: [phone] Fax: [phone]  [facility] [address] [place] [address] Phone: [phone] Fax: [phone]     Social Determinants of Health (SDOH) Interventions    Readmission Risk Interventions No flowsheet data found.

## 2021-04-05 NOTE — Progress Notes (Addendum)
Physical Therapy Treatment Patient Details Name: Jade [name] MRN: [id number] DOB: 08-11-1956 Today's Date: 04/05/2021    History of Present Illness Pt is a 65 y.o. female who presented 04/01/21 with R-sided weakness. MRI reveals an embolic appearing stroke on the left, affecting a posterior MCA branch. Imaging revealed severe L ICA origin stenosis. Of note, a few weeks ago pt had stroke affecting her vision and R-hand strength, MRI then revealed acute L thalamic , anterior left frontal and possible right frontal and multiple remote lacunar infarcts. PMH: arthritis, CKD, COPD, CVA, fibromyalgia, gout, HTN, migraine, OSA, DM2    PT Comments    Pt is progressing well toward her goals. Pt required min-mod assist +2 for sit to stand and max assist+2 for stand pivot transfer. During exercises today pt demonstrated additional active motion in her RUE & RLE. Educated pt on appropriate exercises to maintain her RLE & RUE flexibility. Pt motivated to improve and regain independence. Updated recommendations to CIR-level therapies to address current impairments.  We will continue to follow her acutely to promote independence with functional mobility.   Follow Up Recommendations  CIR;Supervision/Assistance - 24 hour     Equipment Recommendations  Other (comment) (defer to next venue of care)    Recommendations for Other Services       Precautions / Restrictions Precautions Precautions: Fall Precaution Comments: post CVA right hemiplegia, right knee buckles Restrictions Weight Bearing Restrictions: No    Mobility  Bed Mobility Overal bed mobility: Needs Assistance Bed Mobility: Supine to Sit;Sit to Supine     Supine to sit: Mod assist;+2 for physical assistance Sit to supine: Mod assist;+2 for physical assistance   General bed mobility comments: Pt in chair upon entry    Transfers Overall transfer level: Needs assistance Equipment used: 2 person hand held assist Transfers: Sit  to/from Stand;Stand Pivot Transfers Sit to Stand: +2 physical assistance;Mod assist;Min assist Stand pivot transfers: Max assist;+2 physical assistance       General transfer comment: min-Mod A +2 for sit to stand for steadying. Manual assist to prevent buckling of R knee. Max assist +2 for stand pivot transfer to [facility] for toileting and back to recliner. Required assist with weightshifting and blocking R knee during stance phase. Also required assist to advance RLE.  Ambulation/Gait                 Stairs             Wheelchair Mobility    Modified Rankin (Stroke Patients Only) Modified Rankin (Stroke Patients Only) Pre-Morbid Rankin Score: Moderate disability Modified Rankin: Severe disability     Balance Overall balance assessment: Needs assistance Sitting-balance support: Single extremity supported Sitting balance-Leahy Scale: Poor Sitting balance - Comments: min to min guard A to maintain sitting balance.   Standing balance support: Single extremity supported;During functional activity Standing balance-Leahy Scale: Poor Standing balance comment: Pt requires external assist                            Cognition Arousal/Alertness: Awake/alert Behavior During Therapy: WFL for tasks assessed/performed Overall Cognitive Status: Within Functional Limits for tasks assessed                                 General Comments: Pt does occasionally stumble over her words and struggles with wordfinding.      Exercises Other Exercises Other Exercises:  Seated long arc quads. LLE 3 sets of 10 actively. RLE 2 sets of 10 active initiation with min to mod A PT assist into increased range. Other Exercises: Seated hip flexion (marching). BLE 2x10. As pt fatigued, unable to flex R hip as much. Other Exercises: Seated hands clasp shoulder flexion with self active assist X12. Cues to raise arms as high as possible, starting with elbow flexion only,  progressed to some shoulder flexion.    General Comments General comments (skin integrity, edema, etc.): Pt motivated to improve and describes exercises she does every night to maintain R hand flexibility. Educated on additional UE exercises utilizing self active-assist such as clasped hand raises. Additional education on continuing to attempt to use R side, both UE and LE to promote neuroplasticity.      Pertinent Vitals/Pain Pain Assessment: No/denies pain    Home Living                      Prior Function            PT Goals (current goals can now be found in the care plan section) Acute Rehab PT Goals Patient Stated Goal: to regain independence PT Goal Formulation: With patient Time For Goal Achievement: 04/16/21 Potential to Achieve Goals: Good Progress towards PT goals: Progressing toward goals    Frequency    Min 4X/week      PT Plan Discharge plan needs to be updated;Frequency needs to be updated    Co-evaluation              AM-PAC PT "6 Clicks" Mobility   Outcome Measure  Help needed turning from your back to your side while in a flat bed without using bedrails?: A Lot Help needed moving from lying on your back to sitting on the side of a flat bed without using bedrails?: Total Help needed moving to and from a bed to a chair (including a wheelchair)?: Total Help needed standing up from a chair using your arms (e.g., wheelchair or bedside chair)?: Total Help needed to walk in hospital room?: Total Help needed climbing 3-5 steps with a railing? : Total 6 Click Score: 7    End of Session Equipment Utilized During Treatment: Gait belt Activity Tolerance: Patient tolerated treatment well Patient left: in chair;with call bell/phone within reach;with chair alarm set Nurse Communication: Mobility status PT Visit Diagnosis: Other abnormalities of gait and mobility (R26.89);Hemiplegia and hemiparesis;Unsteadiness on feet (R26.81);Muscle weakness  (generalized) (M62.81);Difficulty in walking, not elsewhere classified (R26.2);Apraxia (R48.2);Other symptoms and signs involving the nervous system (R29.898) Hemiplegia - Right/Left: Right Hemiplegia - dominant/non-dominant: Dominant Hemiplegia - caused by: Cerebral infarction     Time: 1420-1450 PT Time Calculation (min) (ACUTE ONLY): 30 min  Charges:  $Therapeutic Exercise: 8-22 mins $Therapeutic Activity: 8-22 mins                     Johnn Hai, SPT   Johnn Hai 04/05/2021, 4:40 PM

## 2021-04-05 NOTE — Progress Notes (Signed)
Inpatient Rehab Admissions Coordinator Note:   Per therapy recommendations, pt was screened for CIR candidacy by Estill Dooms, PT, DPT.  Pt was previously assessed for CIR on 03/08/21 at which time it was determined she did not have the caregiver support necessary for a CIR admission.  If pt does have 24/7 supervision now, please place an IP rehab consult.  If she does not have 24/7 support, would agree with PT recommendations for SNF.    Please contact me with questions.   Estill Dooms, PT, DPT [phone] 04/05/21 3:31 PM

## 2021-04-06 ENCOUNTER — Encounter (HOSPITAL_COMMUNITY)
Admission: EM | Disposition: A | Discharge status: Skilled Nursing Facility | Payer: Self-pay | Source: Home / Self Care | Attending: Internal Medicine

## 2021-04-06 ENCOUNTER — Inpatient Hospital Stay (HOSPITAL_COMMUNITY): Payer: Medicare Other

## 2021-04-06 ENCOUNTER — Encounter (HOSPITAL_COMMUNITY): Payer: Self-pay | Admitting: Internal Medicine

## 2021-04-06 DIAGNOSIS — I6522 Occlusion and stenosis of left carotid artery: Secondary | ICD-10-CM

## 2021-04-06 HISTORY — PX: TRANSCAROTID ARTERY REVASCULARIZATIONÂ: SHX6778

## 2021-04-06 HISTORY — PX: ULTRASOUND GUIDANCE FOR VASCULAR ACCESS: SHX6516

## 2021-04-06 LAB — BASIC METABOLIC PANEL WITH GFR
Anion gap: 11 (ref 5–15)
BUN: 24 mg/dL — ABNORMAL HIGH (ref 8–23)
CO2: 24 mmol/L (ref 22–32)
Calcium: 9.5 mg/dL (ref 8.9–10.3)
Chloride: 107 mmol/L (ref 98–111)
Creatinine, Ser: 0.98 mg/dL (ref 0.44–1.00)
GFR, Estimated: >60 mL/min
Glucose, Bld: 130 mg/dL — ABNORMAL HIGH (ref 70–99)
Potassium: 3.5 mmol/L (ref 3.5–5.1)
Sodium: 142 mmol/L (ref 135–145)

## 2021-04-06 LAB — GLUCOSE, CAPILLARY
Glucose-Capillary: 119 mg/dL — ABNORMAL HIGH (ref 70–99)
Glucose-Capillary: 126 mg/dL — ABNORMAL HIGH (ref 70–99)
Glucose-Capillary: 130 mg/dL — ABNORMAL HIGH (ref 70–99)
Glucose-Capillary: 137 mg/dL — ABNORMAL HIGH (ref 70–99)
Glucose-Capillary: 164 mg/dL — ABNORMAL HIGH (ref 70–99)
Glucose-Capillary: 178 mg/dL — ABNORMAL HIGH (ref 70–99)

## 2021-04-06 LAB — CBC
HCT: 43.4 % (ref 36.0–46.0)
Hemoglobin: 14.4 g/dL (ref 12.0–15.0)
MCH: 31.6 pg (ref 26.0–34.0)
MCHC: 33.2 g/dL (ref 30.0–36.0)
MCV: 95.4 fL (ref 80.0–100.0)
Platelets: 317 10*3/uL (ref 150–400)
RBC: 4.55 MIL/uL (ref 3.87–5.11)
RDW: 12.8 % (ref 11.5–15.5)
WBC: 9.5 10*3/uL (ref 4.0–10.5)
nRBC: 0 % (ref 0.0–0.2)

## 2021-04-06 LAB — SURGICAL PCR SCREEN
MRSA, PCR: NEGATIVE
Staphylococcus aureus: NEGATIVE

## 2021-04-06 LAB — POCT ACTIVATED CLOTTING TIME: Activated Clotting Time: 300 seconds

## 2021-04-06 LAB — MAGNESIUM: Magnesium: 2.3 mg/dL (ref 1.7–2.4)

## 2021-04-06 SURGERY — TRANSCAROTID ARTERY REVASCULARIZATION (TCAR)
Anesthesia: General | Site: Neck | Laterality: Right

## 2021-04-06 MED ORDER — ROCURONIUM BROMIDE 10 MG/ML (PF) SYRINGE
PREFILLED_SYRINGE | INTRAVENOUS | Status: AC
  Filled 2021-04-06: qty 10

## 2021-04-06 MED ORDER — LACTATED RINGERS IV SOLN: INTRAVENOUS | Status: DC

## 2021-04-06 MED ORDER — HYDRALAZINE HCL 20 MG/ML IJ SOLN
10.0000 mg | Freq: Once | INTRAMUSCULAR | Status: AC
  Administered 2021-04-06: 10 mg via INTRAVENOUS
  Filled 2021-04-06: qty 1

## 2021-04-06 MED ORDER — PHENYLEPHRINE HCL-NACL 20-0.9 MG/250ML-% IV SOLN
INTRAVENOUS | Status: DC | PRN
  Administered 2021-04-06: 30 ug/min via INTRAVENOUS

## 2021-04-06 MED ORDER — PROPOFOL 10 MG/ML IV BOLUS
INTRAVENOUS | Status: DC | PRN
  Administered 2021-04-06: 200 mg via INTRAVENOUS

## 2021-04-06 MED ORDER — ONDANSETRON HCL 4 MG/2ML IJ SOLN: 4.0000 mg | Freq: Once | INTRAMUSCULAR | Status: DC | PRN

## 2021-04-06 MED ORDER — EPHEDRINE SULFATE-NACL 50-0.9 MG/10ML-% IV SOSY
PREFILLED_SYRINGE | INTRAVENOUS | Status: DC | PRN
  Administered 2021-04-06: 5 mg via INTRAVENOUS

## 2021-04-06 MED ORDER — PANTOPRAZOLE SODIUM 40 MG PO TBEC
40.0000 mg | DELAYED_RELEASE_TABLET | Freq: Every day | ORAL | Status: DC
  Administered 2021-04-06 – 2021-04-13 (×8): 40 mg via ORAL
  Filled 2021-04-06 (×8): qty 1

## 2021-04-06 MED ORDER — ACETAMINOPHEN 500 MG PO TABS: 1000.0000 mg | ORAL_TABLET | Freq: Once | ORAL | Status: AC

## 2021-04-06 MED ORDER — IOHEXOL 350 MG/ML SOLN
100.0000 mL | Freq: Once | INTRAVENOUS | Status: AC | PRN
  Administered 2021-04-06: 100 mL via INTRAVENOUS

## 2021-04-06 MED ORDER — GLYCOPYRROLATE PF 0.2 MG/ML IJ SOSY
PREFILLED_SYRINGE | INTRAMUSCULAR | Status: DC | PRN
  Administered 2021-04-06: .2 mg via INTRAVENOUS
  Administered 2021-04-06 (×2): .1 mg via INTRAVENOUS

## 2021-04-06 MED ORDER — CEFAZOLIN SODIUM-DEXTROSE 2-4 GM/100ML-% IV SOLN
2.0000 g | Freq: Three times a day (TID) | INTRAVENOUS | Status: AC
Start: 2021-04-06 — End: 2021-04-07
  Administered 2021-04-06 – 2021-04-07 (×2): 2 g via INTRAVENOUS
  Filled 2021-04-06 (×2): qty 100

## 2021-04-06 MED ORDER — IODIXANOL 320 MG/ML IV SOLN
INTRAVENOUS | Status: DC | PRN
  Administered 2021-04-06: 20 mL via INTRA_ARTERIAL

## 2021-04-06 MED ORDER — GUAIFENESIN-DM 100-10 MG/5ML PO SYRP: 15.0000 mL | ORAL_SOLUTION | ORAL | Status: DC | PRN

## 2021-04-06 MED ORDER — DOCUSATE SODIUM 100 MG PO CAPS
100.0000 mg | ORAL_CAPSULE | Freq: Every day | ORAL | Status: DC
  Administered 2021-04-07 – 2021-04-11 (×4): 100 mg via ORAL
  Filled 2021-04-06 (×5): qty 1

## 2021-04-06 MED ORDER — LACTATED RINGERS IV SOLN: INTRAVENOUS | Status: DC | PRN

## 2021-04-06 MED ORDER — POTASSIUM CHLORIDE CRYS ER 20 MEQ PO TBCR: 20.0000 meq | EXTENDED_RELEASE_TABLET | Freq: Every day | ORAL | Status: DC | PRN

## 2021-04-06 MED ORDER — PROTAMINE SULFATE 10 MG/ML IV SOLN
INTRAVENOUS | Status: DC | PRN
  Administered 2021-04-06: 50 mg via INTRAVENOUS

## 2021-04-06 MED ORDER — CHLORHEXIDINE GLUCONATE 0.12 % MT SOLN
OROMUCOSAL | Status: AC
  Administered 2021-04-06: 15 mL via OROMUCOSAL
  Filled 2021-04-06: qty 15

## 2021-04-06 MED ORDER — HEPARIN 6000 UNIT IRRIGATION SOLUTION
Status: DC | PRN
  Administered 2021-04-06: 1

## 2021-04-06 MED ORDER — METOPROLOL TARTRATE 5 MG/5ML IV SOLN: 2.0000 mg | INTRAVENOUS | Status: DC | PRN

## 2021-04-06 MED ORDER — SODIUM CHLORIDE 0.9 % IV SOLN: INTRAVENOUS | Status: DC

## 2021-04-06 MED ORDER — LIDOCAINE 2% (20 MG/ML) 5 ML SYRINGE
INTRAMUSCULAR | Status: AC
  Filled 2021-04-06: qty 5

## 2021-04-06 MED ORDER — LABETALOL HCL 5 MG/ML IV SOLN
INTRAVENOUS | Status: DC | PRN
  Administered 2021-04-06: 5 mg via INTRAVENOUS

## 2021-04-06 MED ORDER — PHENOL 1.4 % MT LIQD: 1.0000 | OROMUCOSAL | Status: DC | PRN

## 2021-04-06 MED ORDER — HYDRALAZINE HCL 20 MG/ML IJ SOLN
5.0000 mg | INTRAMUSCULAR | Status: AC | PRN
  Administered 2021-04-07 (×2): 5 mg via INTRAVENOUS
  Filled 2021-04-06 (×2): qty 1

## 2021-04-06 MED ORDER — ONDANSETRON HCL 4 MG/2ML IJ SOLN
INTRAMUSCULAR | Status: DC | PRN
  Administered 2021-04-06: 4 mg via INTRAVENOUS

## 2021-04-06 MED ORDER — LABETALOL HCL 5 MG/ML IV SOLN
10.0000 mg | INTRAVENOUS | Status: DC | PRN
Start: 2021-04-06 — End: 2021-04-07

## 2021-04-06 MED ORDER — ACETAMINOPHEN 500 MG PO TABS
ORAL_TABLET | ORAL | Status: AC
  Administered 2021-04-06: 1000 mg via ORAL
  Filled 2021-04-06: qty 2

## 2021-04-06 MED ORDER — FENTANYL CITRATE (PF) 100 MCG/2ML IJ SOLN: 25.0000 ug | INTRAMUSCULAR | Status: DC | PRN

## 2021-04-06 MED ORDER — 0.9 % SODIUM CHLORIDE (POUR BTL) OPTIME
TOPICAL | Status: DC | PRN
  Administered 2021-04-06: 1000 mL

## 2021-04-06 MED ORDER — FENTANYL CITRATE (PF) 250 MCG/5ML IJ SOLN
INTRAMUSCULAR | Status: AC
  Filled 2021-04-06: qty 5

## 2021-04-06 MED ORDER — MAGNESIUM SULFATE 2 GM/50ML IV SOLN: 2.0000 g | Freq: Every day | INTRAVENOUS | Status: DC | PRN

## 2021-04-06 MED ORDER — SUGAMMADEX SODIUM 200 MG/2ML IV SOLN
INTRAVENOUS | Status: DC | PRN
  Administered 2021-04-06: 200 mg via INTRAVENOUS

## 2021-04-06 MED ORDER — FENTANYL CITRATE (PF) 250 MCG/5ML IJ SOLN
INTRAMUSCULAR | Status: DC | PRN
  Administered 2021-04-06: 100 ug via INTRAVENOUS

## 2021-04-06 MED ORDER — DEXAMETHASONE SODIUM PHOSPHATE 10 MG/ML IJ SOLN
INTRAMUSCULAR | Status: DC | PRN
  Administered 2021-04-06: 5 mg via INTRAVENOUS

## 2021-04-06 MED ORDER — ALUM & MAG HYDROXIDE-SIMETH 200-200-20 MG/5ML PO SUSP: 15.0000 mL | ORAL | Status: DC | PRN

## 2021-04-06 MED ORDER — SODIUM CHLORIDE 0.9 % IV SOLN
500.0000 mL | Freq: Once | INTRAVENOUS | Status: DC | PRN
Start: 2021-04-06 — End: 2021-04-09

## 2021-04-06 MED ORDER — ONDANSETRON HCL 4 MG/2ML IJ SOLN: 4.0000 mg | Freq: Four times a day (QID) | INTRAMUSCULAR | Status: DC | PRN

## 2021-04-06 MED ORDER — OXYCODONE-ACETAMINOPHEN 5-325 MG PO TABS
1.0000 | ORAL_TABLET | ORAL | Status: DC | PRN
  Administered 2021-04-10 – 2021-04-11 (×2): 1 via ORAL
  Filled 2021-04-06 (×2): qty 1

## 2021-04-06 MED ORDER — ESMOLOL HCL 100 MG/10ML IV SOLN
INTRAVENOUS | Status: DC | PRN
  Administered 2021-04-06: 50 mg via INTRAVENOUS

## 2021-04-06 MED ORDER — HEPARIN SODIUM (PORCINE) 5000 UNIT/ML IJ SOLN
5000.0000 [IU] | Freq: Three times a day (TID) | INTRAMUSCULAR | Status: DC
  Administered 2021-04-07 – 2021-04-13 (×20): 5000 [IU] via SUBCUTANEOUS
  Filled 2021-04-06 (×20): qty 1

## 2021-04-06 MED ORDER — CHLORHEXIDINE GLUCONATE 0.12 % MT SOLN: 15.0000 mL | Freq: Once | OROMUCOSAL | Status: AC

## 2021-04-06 MED ORDER — LIDOCAINE 2% (20 MG/ML) 5 ML SYRINGE
INTRAMUSCULAR | Status: DC | PRN
Start: 2021-04-06 — End: 2021-04-06
  Administered 2021-04-06: 100 mg via INTRAVENOUS

## 2021-04-06 MED ORDER — ORAL CARE MOUTH RINSE: 15.0000 mL | Freq: Once | OROMUCOSAL | Status: AC

## 2021-04-06 MED ORDER — ROCURONIUM BROMIDE 10 MG/ML (PF) SYRINGE
PREFILLED_SYRINGE | INTRAVENOUS | Status: DC | PRN
  Administered 2021-04-06: 60 mg via INTRAVENOUS

## 2021-04-06 MED ORDER — MORPHINE SULFATE (PF) 2 MG/ML IV SOLN: 2.0000 mg | INTRAVENOUS | Status: DC | PRN

## 2021-04-06 MED ORDER — PHENYLEPHRINE 40 MCG/ML (10ML) SYRINGE FOR IV PUSH (FOR BLOOD PRESSURE SUPPORT)
PREFILLED_SYRINGE | INTRAVENOUS | Status: DC | PRN
  Administered 2021-04-06 (×2): 80 ug via INTRAVENOUS
  Administered 2021-04-06: 40 ug via INTRAVENOUS

## 2021-04-06 MED ORDER — SODIUM CHLORIDE 0.9 % IV SOLN
0.0500 ug/kg/min | INTRAVENOUS | Status: DC
  Administered 2021-04-06: .15 ug/kg/min via INTRAVENOUS
  Filled 2021-04-06: qty 5000

## 2021-04-06 SURGICAL SUPPLY — 52 items
APL PRP STRL LF DISP 70% ISPRP (MISCELLANEOUS) ×2
APL SKNCLS STERI-STRIP NONHPOA (GAUZE/BANDAGES/DRESSINGS) ×2
BAG BANDED W/RUBBER/TAPE 36X54 (MISCELLANEOUS) ×4 IMPLANT
BAG EQP BAND 135X91 W/RBR TAPE (MISCELLANEOUS) ×2
BALLN STERLING RX 5X30X80 (BALLOONS) ×4
BALLN STERLING RX 6X20X80 (BALLOONS) ×4
BALLOON STERLING RX 5X30X80 (BALLOONS) IMPLANT
BALLOON STERLING RX 6X20X80 (BALLOONS) IMPLANT
BENZOIN TINCTURE PRP APPL 2/3 (GAUZE/BANDAGES/DRESSINGS) ×4 IMPLANT
BLADE MINI RND TIP GREEN BEAV (BLADE) IMPLANT
CANISTER SUCT 3000ML PPV (MISCELLANEOUS) ×4 IMPLANT
CHLORAPREP W/TINT 26 (MISCELLANEOUS) ×4 IMPLANT
CLIP VESOCCLUDE MED 6/CT (CLIP) ×4 IMPLANT
CLIP VESOCCLUDE SM WIDE 6/CT (CLIP) ×4 IMPLANT
CLOSURE WOUND 1/2 X4 (GAUZE/BANDAGES/DRESSINGS) ×1
COVER DOME SNAP 22 D (MISCELLANEOUS) ×4 IMPLANT
COVER PROBE W GEL 5X96 (DRAPES) ×4 IMPLANT
DRAPE FEMORAL ANGIO 80X135IN (DRAPES) ×4 IMPLANT
ELECT REM PT RETURN 9FT ADLT (ELECTROSURGICAL) ×4
ELECTRODE REM PT RTRN 9FT ADLT (ELECTROSURGICAL) ×2 IMPLANT
GAUZE SPONGE 4X4 12PLY STRL (GAUZE/BANDAGES/DRESSINGS) ×4 IMPLANT
GLOVE SURG POLYISO LF SZ8 (GLOVE) ×4 IMPLANT
GOWN STRL REUS W/ TWL LRG LVL3 (GOWN DISPOSABLE) ×4 IMPLANT
GOWN STRL REUS W/ TWL XL LVL3 (GOWN DISPOSABLE) ×2 IMPLANT
GOWN STRL REUS W/TWL LRG LVL3 (GOWN DISPOSABLE) ×8
GOWN STRL REUS W/TWL XL LVL3 (GOWN DISPOSABLE) ×4
GUIDEWIRE ENROUTE 0.014 (WIRE) ×4 IMPLANT
INTRODUCER KIT GALT 7CM (INTRODUCER) ×4
KIT BASIN OR (CUSTOM PROCEDURE TRAY) ×4 IMPLANT
KIT ENCORE 26 ADVANTAGE (KITS) ×4 IMPLANT
KIT INTRODUCER GALT 7 (INTRODUCER) ×2 IMPLANT
KIT TURNOVER KIT B (KITS) ×4 IMPLANT
NS IRRIG 1000ML POUR BTL (IV SOLUTION) ×2 IMPLANT
PACK CAROTID (CUSTOM PROCEDURE TRAY) ×4 IMPLANT
POSITIONER HEAD DONUT 9IN (MISCELLANEOUS) ×4 IMPLANT
SET MICROPUNCTURE 5F STIFF (MISCELLANEOUS) ×2 IMPLANT
STENT TRANSCAROTID SYSTEM 9X30 (Permanent Stent) ×2 IMPLANT
STRIP CLOSURE SKIN 1/2X4 (GAUZE/BANDAGES/DRESSINGS) ×3 IMPLANT
SUT MNCRL AB 4-0 PS2 18 (SUTURE) ×4 IMPLANT
SUT PROLENE 5 0 C 1 24 (SUTURE) ×4 IMPLANT
SUT PROLENE 6 0 BV (SUTURE) IMPLANT
SUT SILK 2 0 SH CR/8 (SUTURE) ×4 IMPLANT
SUT VIC AB 3-0 SH 27 (SUTURE) ×4
SUT VIC AB 3-0 SH 27X BRD (SUTURE) ×2 IMPLANT
SYR 10ML LL (SYRINGE) ×12 IMPLANT
SYR 20ML LL LF (SYRINGE) ×4 IMPLANT
SYR CONTROL 10ML LL (SYRINGE) IMPLANT
SYSTEM TRANSCAROTID NEUROPRTCT (MISCELLANEOUS) ×2 IMPLANT
TOWEL GREEN STERILE (TOWEL DISPOSABLE) ×4 IMPLANT
TRANSCAROTID NEUROPROTECT SYS (MISCELLANEOUS) ×4
WATER STERILE IRR 1000ML POUR (IV SOLUTION) ×4 IMPLANT
WIRE BENTSON .035X145CM (WIRE) ×4 IMPLANT

## 2021-04-06 NOTE — Care Management Important Message (Signed)
Important Message  Patient Details  Name: GARYN WAGUESPACK MRN: [id number] Date of Birth: 03/12/1956   Medicare Important Message Given:  Yes     Charly Hunton Stefan Church 04/06/2021, 2:43 PM

## 2021-04-06 NOTE — Anesthesia Preprocedure Evaluation (Signed)
Anesthesia Evaluation  Patient identified by MRN, date of birth, ID band Patient awake    Reviewed: Allergy & Precautions, NPO status , Patient's Chart, lab work & pertinent test results  Airway Mallampati: II  TM Distance: >3 FB Neck ROM: Full    Dental  (+) Dental Advisory Given, Missing, Poor Dentition   Pulmonary sleep apnea , COPD, former smoker,    Pulmonary exam normal breath sounds clear to auscultation       Cardiovascular hypertension, Pt. on medications + Peripheral Vascular Disease  Normal cardiovascular exam Rhythm:Regular Rate:Normal     Neuro/Psych  Headaches, TIA Neuromuscular disease CVA, Residual Symptoms    GI/Hepatic negative GI ROS, Neg liver ROS,   Endo/Other  diabetes, Type 2, Oral Hypoglycemic AgentsObesity   Renal/GU Renal InsufficiencyRenal disease     Musculoskeletal  (+) Arthritis , Fibromyalgia -  Abdominal   Peds  Hematology negative hematology ROS (+)   Anesthesia Other Findings Day of surgery medications reviewed with the patient.  Reproductive/Obstetrics                             Anesthesia Physical Anesthesia Plan  ASA: 3  Anesthesia Plan: General   Post-op Pain Management:    Induction: Intravenous  PONV Risk Score and Plan: 3  Airway Management Planned: Oral ETT  Additional Equipment: Arterial line  Intra-op Plan:   Post-operative Plan: Extubation in OR  Informed Consent: I have reviewed the patients History and Physical, chart, labs and discussed the procedure including the risks, benefits and alternatives for the proposed anesthesia with the patient or authorized representative who has indicated his/her understanding and acceptance.   Patient has DNR.  Discussed DNR with patient and Continue DNR.   Dental advisory given  Plan Discussed with: CRNA  Anesthesia Plan Comments:         Anesthesia Quick Evaluation

## 2021-04-06 NOTE — TOC Progression Note (Addendum)
Transition of Care [facility]) - Progression Note    Patient Details  Name: Jade [name] MRN: [id number] Date of Birth: 08-Feb-1956  Transition of [facility]) CM/SW Contact  Lorri Frederick, LCSW Phone Number: 04/06/2021, 10:33 AM  Clinical Narrative:   CSW spoke with pt in room regarding CIR vs SNF.  Pt indicates she would like to be considered for CIR.  We discussed need for 24/7 supervision and pt reports her husband works and is not available.  She is not sure this level of support is possible. Her husband does not speak good english, she asked CSW to contact her son.  Permission given to speak with both husband and son.  CSW spoke with son Leotis Shames who indicated that he would be available to provide 24/7 support for his mother after CIR "for as long as it's needed."  CSW contacted Caitlin at [facility] with this update.     Expected Discharge Plan: Skilled Nursing Facility Barriers to Discharge: Continued Medical Work up  Expected Discharge Plan and Services Expected Discharge Plan: Skilled Nursing Facility In-house Referral: Clinical Social Work Discharge Planning Services: CM Consult Post Acute Care Choice: Skilled Nursing Facility Living arrangements for the past 2 months: Single Family Home                                       Social Determinants of Health (SDOH) Interventions    Readmission Risk Interventions No flowsheet data found.

## 2021-04-06 NOTE — Progress Notes (Signed)
65 year old female status post left TCAR with Dr. Lenell Antu today.  I was called when she was noted to have worsening neurologic exam in PACU post-op.  Noted more profound weakness in the right leg.  On my exam right lower extremity 2/5 strength and previous documentation suggests 4+/ 5.  Code stroke called.  I have reviewed her CTA neck and the carotid stent is patent.  No evidence of intra-cranial hemorrhage.  Certainly appreciate neurology input.  Cephus Shelling, MD [facility] [place] Office: [phone]   Cephus Shelling

## 2021-04-06 NOTE — Progress Notes (Signed)
[facility] [place] PROGRESS NOTE  ASSESSMENT / PLAN: Jade [name] is a 65 y.o. female with symptomatic left carotid artery stenosis. For left TCAR today. Continue ASA / Plavix / Statin.    SUBJECTIVE: No interval change.  OBJECTIVE: BP (!) 249/79   Pulse 82   Temp 98.4 F (36.9 C) (Oral)   Resp (!) 21   Ht 5\' 1"  (1.549 m)   Wt 76.2 kg   SpO2 97%   BMI 31.74 kg/m   Intake/Output Summary (Last 24 hours) at 04/06/2021 1057 Last data filed at 04/06/2021 0513 Gross per 24 hour  Intake --  Output 700 ml  Net -700 ml     No acute distress Regular rate and rhythm Unlabored No change in neurologic exam  CBC Latest Ref Rng & Units 04/06/2021 04/01/2021 03/12/2021  WBC 4.0 - 10.5 K/uL 9.5 10.5 9.3  Hemoglobin 12.0 - 15.0 g/dL 03/14/2021 42.5 95.6  Hematocrit 36.0 - 46.0 % 43.4 43.2 43.2  Platelets 150 - 400 K/uL 317 365 296     CMP Latest Ref Rng & Units 04/06/2021 04/01/2021 03/12/2021  Glucose 70 - 99 mg/dL 03/14/2021) 564(P) 329(J)  BUN 8 - 23 mg/dL 188(C) 21 16(S)  Creatinine 0.44 - 1.00 mg/dL 06(T 0.16 0.10)  Sodium 135 - 145 mmol/L 142 139 137  Potassium 3.5 - 5.1 mmol/L 3.5 3.3(L) 3.6  Chloride 98 - 111 mmol/L 107 106 104  CO2 22 - 32 mmol/L 24 22 24   Calcium 8.9 - 10.3 mg/dL 9.5 9.32(T 9.6  Total Protein 6.5 - 8.1 g/dL - 7.1 6.5  Total Bilirubin 0.3 - 1.2 mg/dL - 1.0 0.8  Alkaline Phos 38 - 126 U/L - 75 65  AST 15 - 41 U/L - 23 25  ALT 0 - 44 U/L - 17 23    Estimated Creatinine Clearance: 52.8 mL/min (by C-G formula based on SCr of 0.98 mg/dL).  . 55.7, MD [facility] Phone Number: [phone] 04/06/2021 10:57 AM

## 2021-04-06 NOTE — Op Note (Signed)
DATE OF SERVICE: 04/06/2021  PATIENT:  Jade [name]  65 y.o. female  PRE-OPERATIVE DIAGNOSIS:  symptomatic, severe left carotid artery stenoiss  POST-OPERATIVE DIAGNOSIS:  Same  PROCEDURE:   Left transcarotid artery revascularization (TCAR)  SURGEON:  Surgeon(s) and Role:    * Leonie Douglas, MD - Primary    Cephus Shelling, MD - Assisting  ASSISTANT: Sherald Hess, MD  An assistant was required to facilitate exposure and expedite the case.  ANESTHESIA:   general  EBL: minimal  BLOOD ADMINISTERED:none  DRAINS: none   LOCAL MEDICATIONS USED:  NONE  SPECIMEN:  none  COUNTS: confirmed correct.  TOURNIQUET:  none  PATIENT DISPOSITION:  PACU - hemodynamically stable.   Delay start of Pharmacological VTE agent (>24hrs) due to surgical blood loss or risk of bleeding: no  INDICATION FOR PROCEDURE: Jade [name] is a 65 y.o. female with symptomatic left carotid artery stenosis. CT angiogram revealed difficult open surgical anatomy. After careful discussion of risks, benefits, and alternatives the patient was offered TCAR. We specifically discussed risk of stroke, risk of cranial nerve injury. The patient understood and wished to proceed.  OPERATIVE FINDINGS: successful left TCAR. Lesion required pre-dilation (5x76mm), stent (9x16mm), and post-dilation (6x63mm). Good technical result. Patient awoke from anesthesia at her neurologic baseline (right hand weakness).   DESCRIPTION OF PROCEDURE: After identification of the patient in the pre-operative holding area, the patient was transferred to the operating room. The patient was positioned supine on the operating room table. Anesthesia was induced. The left neck and groins were prepped and draped in standard fashion. A surgical pause was performed confirming correct patient, procedure, and operative location.  Using intraoperative ultrasound the course of the left common carotid artery was mapped on the skin.  A  transverse incision was made between the sternal and clavicular heads of the sternocleidomastoid muscle, below the omohyoid. Following longitudinal division of the carotid sheath the jugular vein was partially skeletonized and retracted medially. Once 3 cm of common carotid artery (CCA) were isolated, umbilical tape was placed around the proximal 1/3 of the CCA under direct vision. A 5-O Prolene suture was pre-placed in the anterior wall of the CCA, in a "U stitch" configuration,  close to the clavicle to facilitate hemostasis upon removal of the arterial sheath at completion of the TCAR procedure.   The contralateral common femoral vein (CFV) was accessed under ultrasound guidance, using standard Seldinger and micropuncture access technique. The venous return sheath was advanced into the CFV over the 0.035" wire provided. Blood was aspirated from the flow line followed by flushing of the Venous Sheath with heparinized saline. The Venous Sheath was secured to the patient's skin with suture to maintain  optimal position in the vessel. Heparin was given to obtain a therapeutic activated clotting time >250 seconds prior to arterial access.   A 4-French non-stiffened ENHANCE Transcarotid / Peripheral Access set was used, puncturing the artery with the 21G needle through the pre-placed "U" stitch while holding gentle traction on the umbilical tape to stabilize and centralize the CCA within the incision. Careful attention was paid to the change in CCA shape when using the umbilical tape to control or lift the artery. The micropuncture wire was then advanced 3-4 cm into the CCA and, the 21G needle was removed. The micropuncture sheath was advanced 2-3 cm into the CCA and the wire and dilator were removed. Pulsatile backflow indicated correct positioning. The provided 0.035" J-tipped guidewire was inserted as close as possible  to the bifurcation without engaging the lesion. After micropuncture sheath removal, the  Transcarotid Arterial Sheath was advanced to the 2.5cm marker and the 0.035" wire and dilator were then removed. Arterial Sheath position was assessed under fluoroscopy in two projections to ensure that the sheath tip was oriented coaxially in the CCA. The Arterial Sheath was sutured to the patient with gentle forward tension. Blood was slowly aspirated followed by flushing with heparinized saline. No ingress of air bubbles through the passive hemostatic valve was observed. The stopcocks were closed. Traction applied to the CCA previously to facilitate access was gently released.  The Flow Controller was connected to the Transcarotid Arterial Sheath, prepared by passively allowing a column of arterial blood to fill the line and connected to the Venous Return Sheath. CCA inflow was occluded proximal to the arteriotomy with a vascular clamp to achieve active flow reversal. To confirm flow reversal, a saline bolus was delivered into the venous flow line on both "High" and "Low" flow settings of the Flow Controller. Angiograms were performed with slow injections of a small amount of contrast filling just past the lesion to minimize antegrade transmission of micro-bubbles.  Prior to lesion manipulation, heart rate (70bpm) and systolic BP (140-161mmHg) were managed upwards to optimize flow reversal and procedural neuroprotection. The lesion was crossed with an 0.014" ENROUTE guidewire and pre-dilation of the lesion was performed with a 5x18mm rapid exchange 0.014" compatible balloon catheter to 8 atmospheres for 10 seconds. Stenting was performed with an 9x64mm ENROUTE Transcarotid stent, sized appropriately to the right CCA.  A 6x68mm angioplasty was used for post-dilation. A total of 10 minutes of flow reversal was used.  AP and lateral angiograms (gentle contrast injections) were performed to confirm stent placement and arterial wall stent apposition. At [facility] case completion, antegrade flow was restored by  releasing the clamp on the CCA then closing the NPS stopcocks to the flow lines. The Transcarotid Arterial Sheath was removed and the pre-closure suture was tied. Heparin reversal was employed and a drain was placed. The Venous Return Sheath was removed and hemostasis was achieved with brief manual compression.   Upon completion of the case instrument and sharps counts were confirmed correct. The patient was transferred to the PACU in good condition. I was present for all portions of the procedure.  Rande Brunt. Lenell Antu, MD [facility] Phone Number: [phone] 04/06/2021 1:30 PM

## 2021-04-06 NOTE — Progress Notes (Signed)
Inpatient Rehabilitation Admissions Coordinator   Inpatient rehab consult received Patient at [facility]. I will follow up with her tomorrow for rehab assessment.  Ottie Glazier, RN, MSN Rehab Admissions Coordinator [phone] 04/06/2021 2:29 PM

## 2021-04-06 NOTE — Consult Note (Addendum)
Neurology consult   CC: code stroke.  History is obtained from: PACU staff, MD, patient.   HPI: [name] is a 65 yo female with a PMHx of multiple strokes, carotid stenosis s/p left trans carotid artery revascularization today, HTN, DM II, DM retinopathy, OSA, COPD, fibromyalgia, gout, MHAs, obesity, CKD, and HLD.   After patient's stroke found a few days ago, workup showed left ICA with over 80% stenosis. LKW today at 1115 in preoperative area (already had right sided weakness from last stroke). She underwent TCAR and neuro checks were done. Patient's right sided weakness became worse on assessment. Therefore, a code stroke was called from PACU. Patient was brought emergently to CT suite. CTH showed no acute infarct. CTA head and neck revealed no LVO and her stent was open. tPA was not an option due to recent stroke and TCAR today. Patient was then taken to her post op bed on 4E.   History of this hospitalization-patient presented to the ED 04/02/21 with weakness of right arm. MRI brain showed left frontal paracentral gyrus cortical infarct. CTA showed left ICA stenosis of 80%. She was previously offered carotid endarterectomy, but refused. She was already on Plavix/ASA. On 04/06/21, vascular surgery was consulted and patient underwent left TCAR today.   Remote stroke: Patient was admitted in 03/07/21 for worsened blurry vision and HTN. MRI brain revealed left inferior thalamic small infarcts and punctate subacute left frontal infarct. MRA showed severe distal right M2 and right P2 stenosis. LICA was found to be 75% stenosed and patient was discharged home 03/14/21 on DAPT for 21 days, then Plavix alone. She was placed on Lipitor $RemoveBe'80mg'JJbWAXuXf$ .   She also had a left thalamic/internal capsule infarct in 02/2018. 80% ICA stenosis found at that time. Patient was offered CEA, but patient cancelled appointment.   LKW: 1115 hours tpa given?: No, patient with recent stroke and surgery today.  IR  Thrombectomy?: No, no LVO.   NIHSS:  1a Level of Consciousness: 0 1b LOC Questions: 0 1c LOC Commands: 0 2 Best Gaze: 0 3 Visual: 0 4 Facial Palsy: 0 5a Motor Arm - left: 1 5b Motor Arm - Right: 2 6a Motor Leg - Left: 1 6b Motor Leg - Right: 3 7 Limb Ataxia: 0 8 Sensory: 0 9 Best Language: 0 10 Dysarthria: 0 11 Extinction and Inattention: 0 TOTAL:  7  ROS: A robust ROS was unable to be performed due to emergent nature of event.   Past Medical History:  Diagnosis Date   Arthritis    "back, knees, some in my left hip" (03/21/2018)   Chronic kidney disease    "was told I had 25% kidney function in early 2012" (03/21/2018)   COPD (chronic obstructive pulmonary disease) (HCC)    CVA (cerebral vascular accident) ([place]) 03/21/2018   "numb all over; head to toe" (03/21/2018)   Fibromyalgia    History of gout    Hyperlipidemia    Hypertension    Migraine    "used to get them monthly during menopause" (03/21/2018)   Neuromuscular disorder (HCC)    OSA (obstructive sleep apnea)    "don't tolerate the machine" (03/21/2018)   Pneumonia ~ 2007   Type 2 diabetes, diet controlled ([place])     Family History  Problem Relation Age of Onset   Heart disease Father    Leukemia Father    Alcohol abuse Son    Other Mother    Cancer Neg Hx    Diabetes Neg Hx  Hearing loss Neg Hx    Hyperlipidemia Neg Hx    Hypertension Neg Hx    Kidney disease Neg Hx    Stroke Neg Hx    Social History:  reports that she quit smoking about 33 years ago. Her smoking use included cigarettes. She has a 20.00 pack-year smoking history. She has never used smokeless tobacco. She reports that she does not currently use alcohol. She reports that she does not currently use drugs after having used the following drugs: Marijuana.   Prior to Admission medications   Medication Sig Start Date End Date Taking? Authorizing Provider  amLODipine (NORVASC) 10 MG tablet Take 1 tablet (10 mg total) by mouth daily. 03/15/21  04/14/21 Yes Donne Hazel, MD  atorvastatin (LIPITOR) 80 MG tablet Take 1 tablet (80 mg total) by mouth daily. 03/15/21 04/14/21 Yes Donne Hazel, MD  b complex vitamins capsule Take 1 capsule by mouth daily.   Yes [provider]  Cholecalciferol (VITAMIN D-3) 5000 UNITS TABS Take 5,000 Units by mouth daily.    Yes [provider]  clopidogrel (PLAVIX) 75 MG tablet Take 1 tablet (75 mg total) by mouth daily. 03/15/21 04/14/21 Yes Donne Hazel, MD  Coenzyme Q10 (COQ-10) 100 MG CAPS Take 100 mg by mouth at bedtime.   Yes [provider]  hydrochlorothiazide (MICROZIDE) 12.5 MG capsule Take 1 capsule (12.5 mg total) by mouth daily. 02/17/21 02/17/22 Yes Fransico Meadow, PA-C  magnesium gluconate (MAGONATE) 500 MG tablet Take 1,000 mg by mouth at bedtime.   Yes [provider]  Menaquinone-7 (VITAMIN K2 PO) Take 1 tablet by mouth daily.   Yes [provider]  Omega-3 Fatty Acids (FISH OIL) 600 MG CAPS Take 600 mg by mouth daily.   Yes [provider]  Blood Glucose Calibration (ACCU-CHEK SMARTVIEW CONTROL) LIQD Test up to TID dx:E11.40 09/13/14   Janith Lima, MD  Blood Glucose Monitoring Suppl (ACCU-CHEK NANO SMARTVIEW) W/DEVICE KIT Test up to TID dx:E11.40 09/13/14   Janith Lima, MD  glucose blood (ACCU-CHEK SMARTVIEW) test strip Test up to TID dx:E11.40 09/13/14   Janith Lima, MD  Lancets Misc. (ACCU-CHEK FASTCLIX LANCET) KIT Test up to TID dx:E11.40 09/13/14   Janith Lima, MD    Exam: Current vital signs: BP 116/74 (BP Location: Left Leg)   Pulse 78   Temp (!) 97.4 F (36.3 C)   Resp 15   Ht $R'5\' 1"'Sj$  (1.549 m)   Wt 76.2 kg   SpO2 93%   BMI 31.74 kg/m   Physical Exam  Constitutional: Appears chronically ill. WDWN in NAD. Obese. Appears older than stated age.  Psych: Affect appropriate to situation. Eyes: No scleral injection. HENT: No OP obstruction. Head: Normocephalic.  Cardiovascular: Normal rate and regular rhythm.   Respiratory: Effort normal.  GI: Abdomen soft.  No distension. There is no tenderness.  Skin: WDI  Neuro: Mental Status: Patient is awake, alert, oriented to person, place, month, year, and situation. Patient is somewhat able to give a clear and coherent history. No signs of neglect. Speech/Language:  Speech is clear, fluent without dysarthria or aphasia. Repetition, naming, and comprehension intact.  Cranial Nerves: II: Visual Fields are full. Pupils are equal, round, and reactive to light.  III,IV, VI: EOMI without ptosis or diploplia.  V: Facial sensation is symmetric to light touch in V1, V2, and V3. VII: Facial movement symmetrical.  VIII: hearing is intact to voice. X: Uvula elevates symmetrically. XI: Shoulder shrug  is symmetric. XII: tongue is midline without atrophy or fasciculations.  Motor: RUE: grips  4-/5     biceps  4/-5     triceps  4-/5 LUE: grips   5/5    biceps  5/5     triceps  5/5 RLE: knee  3+/5     thigh  3+/5     plantar flexion   3+/5     dorsiflexion  3+/5 LLE: knee  5/5     thigh   5/5      plantar flexion   5/5   dorsiflexion   5/5 Sensory: Sensation is symmetric to light touch in all fours extremities. Extinction absent to DSS.  Plantars: Toes are downgoing bilaterally.  Cerebellar: No ataxia noted with left FNF. Unable to perform FNF or bilateral HKS due to weakness    I have reviewed labs in epic and the pertinent results are: 04/01/21 INR  1.    aPTT   25.     TSH 3.3.   LDL 78.   MD reviewed the images obtained: NCT head:  CTA neck:   Aortic arch: Standard 3 vessel aortic arch. Moderate calcified plaque in the proximal left subclavian artery without flow limiting stenosis.  Right carotid system: Patent with unchanged calcified plaque at the carotid bifurcation resulting in less than 50% ICA origin stenosis. No dissection.  Left carotid system: A stent has been placed extending from the distal common carotid artery into the proximal ICA.  The stent is patent with 45% narrowing in its midportion.  Vertebral arteries: The vertebral arteries are patent and codominant without evidence of a dissection. Atherosclerotic plaque is noted at the right vertebral artery origin without evidence of a significant stenosis.  CTA head: Anterior circulation: The internal carotid arteries are patent from skull base to carotid termini with mild atherosclerotic plaque bilaterally not resulting in a significant stenosis. ACAs and MCAs are patent without evidence of a significant A1 or M1 stenosis. There is widespread atherosclerotic branch vessel irregularity including unchanged severe proximal right M2 and left M3 branch stenoses. No aneurysm is identified.  Posterior circulation: The intracranial vertebral arteries are patent to the basilar with atherosclerosis resulting in luminal irregularity on the right but no significant stenosis. The basilar artery is widely patent. The PCAs are patent with severe bilateral P2 stenoses again noted. No aneurysm is identified.  Venous sinuses: As permitted by contrast timing, patent.  IMPRESSION: 1. No emergent large vessel occlusion. 2. Interval left carotid stenting. The stent is patent with a 45% stenosis in its midportion. 3. Intracranial atherosclerosis include unchanged severe bilateral P2 and bilateral MCA branch vessel stenoses. 4. Aortic Atherosclerosis (ICD10-I70.0).   Assessment: 65 yo female with multiple stroke risks including multiple strokes, OSA not on CPAP, HTN, HLD, DM II, and LICA stenosis s/p TCAR today. Patient's imaging in CT suite was negative for new infarct, LVO, and post operative stent was found to be patent. She will have a MRI brain as it is more sensitive for stroke. Her past strokes have been both ischemic and embolic. If MRI is + for stroke, this could be ischemia due to LICA stenosis or embolic s/p TCAR. HbA1c 6.5 on 04/01/21 so no need to r/p. LDL 78 on 04/02/21 and she is  already on Lipitor. However, she has previously refused statins, so she will need further encouragement for adherence to regimen.   Impression:  -worsening known right sided weakness after TCAR on left for LICA stenosis.  Plan: -Patient is  already admitted to hospital.   - MRI brain without contrast. - Patient underwent Echo in 7/22, but may be worth repeating given  new strokes since then.  - Continue Lipitor $RemoveBeforeDE'80mg'[id number]$  po qd.  - Aspirin $RemoveB'81mg'sHvznxyb$  daily. - Clopidogrel $RemoveBefor'75mg'fJtYVRicGabc$  daily. - No need for permissive HTN given surgery today, goal 120-160.  - Telemetry monitoring for arrhythmia. - bedside Swallow screen. - Stroke education. - PT/OT/SLP consult. - NIHSS as per protocol. - frequent neuro checks.  - Stroke team will follow.   Patient seen by Clance Boll, MSN, APN-BC, nurse practitioner and MD. Note/plan to be edited by MD as needed.  Pager: [phone]  ATTENDING NOTE: I reviewed above note and agree with the assessment and plan. Pt was seen and examined.   65 year old female with history of hypertension, diabetes, hyperlipidemia, OSA, obesity, CKD, stroke admitted recently for left frontal precentral gyrus cortical infarct.  CTA head and neck showed left ICA over 80% stenosis.  Put on DAPT and statin.  Had vascular surgery consultation, and scheduled TCAR procedure today.  Per RN, patient last seen at baseline before procedure at 11:15 AM.  After procedure, patient was groggy with the sedation and right lower extremity since not as strong as before, however, considered postprocedure and sedation.  However with hourly neurochecks, patient seems to have worsening of right lower extremity weakness with barely against gravity.  Code stroke activated.  On exam, patient awake alert, eyes open, orientated to age place and time.  No aphasia, able to name repeat, follow simple commands.  No gaze palsy, tracking bilaterally, visual field full, PERRL.  No facial droop, tongue midline.  Left upper  and lower extremity slight drift, right upper extremity 2/5 proximal and 0/5 distal.  Right lower extremity 2+/5 proximal and distal.  Sensation symmetrical, left finger-to-nose mild dysmetria.  Of note, 4 days ago her exam on right lower extremity 4+/5 proximal and 4/5 distally.  Stat CT head no acute finding, subacute left frontal infarct consistent with recent stroke.  CT head and neck stat no LVO, left carotid stenting patent, bilateral PCA severe stenosis unchanged.  Per Dr. Carlis Abbott, patient has no hypotension during procedure.  Per RN, patient was not given aspirin and Plavix this morning before procedure.  Recommend IV fluid to avoid low BP, BP goal 130-160 post carotid stenting.  We will do MRI to further evaluation of stroke.  Continue aspirin Plavix and statin.  PT/OT.  We will follow.  For detailed assessment and plan, please refer to above as I have made changes wherever appropriate.   [name], MD PhD Stroke Neurology 04/06/2021 7:42 PM  Patient condition worsened within the last 24 hours, has developed worsening right-sided weakness, and code stroke activated. I discussed with Dr. Carlis Abbott. I spent  35 minutes in total face-to-face time with the patient, more than 50% of which was spent in counseling and coordination of care, reviewing test results, images and medication, and discussing the diagnosis, treatment plan and potential prognosis. This patient's care requiresreview of multiple databases, neurological assessment, discussion with family, other specialists and medical decision making of high complexity.

## 2021-04-06 NOTE — Anesthesia Procedure Notes (Signed)
Procedure Name: Intubation Date/Time: 04/06/2021 12:02 PM Performed by: Lucinda Dell, CRNA Pre-anesthesia Checklist: Patient identified, Emergency Drugs available, Suction available and Patient being monitored Patient Re-evaluated:Patient Re-evaluated prior to induction Oxygen Delivery Method: Circle system utilized Preoxygenation: Pre-oxygenation with 100% oxygen Induction Type: IV induction Ventilation: Mask ventilation without difficulty Laryngoscope Size: Glidescope Grade View: Grade I Tube type: Oral Tube size: 7.0 mm Number of attempts: 1 Airway Equipment and Method: Stylet Placement Confirmation: ETT inserted through vocal cords under direct vision, positive ETCO2 and breath sounds checked- equal and bilateral Secured at: 22 cm Tube secured with: Tape Dental Injury: Teeth and Oropharynx as per pre-operative assessment  Comments: Intubated by Marthann Schiller SRNA

## 2021-04-06 NOTE — Progress Notes (Signed)
Dr. Desmond Lope with anesthesia made aware of the pt's blood pressure, and the fact that she has not had any medication to treat it since she arrived.

## 2021-04-06 NOTE — Progress Notes (Addendum)
PROGRESS NOTE  Jade [name] NAT:[id number] DOB: 1955-11-29 DOA: 04/01/2021 PCP: System, Provider Not In   LOS: 4 days   Brief narrative:  Jade [name] is a 65 y.o. female with a history of recent CVA, COPD, CKD, hyperlipidemia, hypertension, obesity, diabetes mellitus type 2 presented to hospital with numbness and weakness of the right hand and was noted to have acute left posterior MCA branch CVA likely secondary to severe left ICA stenosis.  Vascular surgery was consulted and plan for TCAR on 04/06/2021  Assessment/Plan:  Principal Problem:   Acute CVA (cerebrovascular accident) [facility]) Active Problems:   Diabetes mellitus type 2 with neurological manifestations (HCC)   Pure hypercholesterolemia   Essential hypertension, benign   Obesity, Class III, BMI 40-49.9 (morbid obesity) (HCC)   TIA (transient ischemic attack)   Carotid artery stenosis with cerebral infarction (HCC)   COPD (chronic obstructive pulmonary disease) (HCC)   Acute CVA History of recent CVA.  Patient did have left ICA stenosis more than 80%.  Neurology on board.  Continue aspirin/Plavix.  Plan for TCAR on 811   Left ICA stenosis Vascular surgery on board.  Plan forTCAR on 8/11.   Hyperlipidemia Continue Lipitor  Essential hypertension Was on permissive hypertension, on amlodipine and hydrochlorothiazide as an outpatient.   Diabetes mellitus, type II with diabetic retinopathy Diet controlled at home.  Hemoglobin A1c was 6.5.  Patient follows with ophthalmology as an outpatient for diabetic retinopathy.  Continue diabetic diet, sliding scale insulin   COPD As needed albuterol   Obesity Body mass index is 32.12 kg/m.  Would benefit from weight loss as outpatient  DVT prophylaxis: SCD's Start: 04/02/21 0208   Code Status: DNR  Family Communication: None  Status is: Inpatient  Remains inpatient appropriate because: IV treatments appropriate due to intensity of illness or inability to take PO,  Inpatient level of care appropriate due to severity of illness, and plan for surgical intervention  Dispo: The patient is from: Home              Anticipated d/c is to: SNF as per PT recommendation.  Could consider CIR as well.              Patient currently is not medically stable to d/c.   Difficult to place patient No  Consultants: Neurology Vascular surgery  Procedures: None yet  Anti-infectives:  None  Anti-infectives (From admission, onward)    Start     Dose/Rate Route Frequency Ordered Stop   04/06/21 0000  ceFAZolin (ANCEF) IVPB 1 g/50 mL premix       Note to Pharmacy: Send with pt to OR   1 g 100 mL/hr over 30 Minutes Intravenous On call 04/05/21 0956 04/07/21 0000       Subjective: Today, patient was seen and examined at bedside.  Awaiting for surgical intervention today.  Denies interval complaints.  Objective: Vitals:   04/05/21 2112 04/06/21 0609  BP:  (!) 191/73  Pulse: 85 63  Resp:  17  Temp: 99.4 F (37.4 C) 97.8 F (36.6 C)  SpO2: 97% 94%    Intake/Output Summary (Last 24 hours) at 04/06/2021 1024 Last data filed at 04/06/2021 0513 Gross per 24 hour  Intake --  Output 700 ml  Net -700 ml    Filed Weights   04/01/21 2010 04/04/21 0500  Weight: 77.1 kg 78.1 kg   Body mass index is 32.53 kg/m.   Physical Exam:  GENERAL: Patient is alert awake and oriented. Not in  obvious distress.  Obese, HENT: No scleral pallor or icterus. Pupils equally reactive to light. Oral mucosa is moist NECK: is supple, no gross swelling noted. CHEST: Clear to auscultation. No crackles or wheezes.  Diminished breath sounds bilaterally. CVS: S1 and S2 heard, no murmur. Regular rate and rhythm.  ABDOMEN: Soft, non-tender, bowel sounds are present. EXTREMITIES: No edema. CNS: Cranial nerves are intact.  Right-sided weakness present. SKIN: warm and dry without rashes.  Data Review: I have personally reviewed the following laboratory data and  studies,  CBC: Recent Labs  Lab 04/01/21 2022 04/06/21 0345  WBC 10.5 9.5  NEUTROABS 7.8*  --   HGB 15.0 14.4  HCT 43.2 43.4  MCV 93.7 95.4  PLT 365 317    Basic Metabolic Panel: Recent Labs  Lab 04/01/21 2022 04/02/21 0411 04/03/21 2001 04/06/21 0345  NA 139  --   --  142  K 3.3*  --   --  3.5  CL 106  --   --  107  CO2 22  --   --  24  GLUCOSE 168*  --   --  130*  BUN 21  --   --  24*  CREATININE 0.96  --   --  0.98  CALCIUM 10.0  --   --  9.5  MG  --  2.0 1.9 2.3  PHOS  --  3.1  --   --     Liver Function Tests: Recent Labs  Lab 04/01/21 2022  AST 23  ALT 17  ALKPHOS 75  BILITOT 1.0  PROT 7.1  ALBUMIN 4.0    No results for input(s): LIPASE, AMYLASE in the last 168 hours. No results for input(s): AMMONIA in the last 168 hours. Cardiac Enzymes: No results for input(s): CKTOTAL, CKMB, CKMBINDEX, TROPONINI in the last 168 hours. BNP (last 3 results) No results for input(s): BNP in the last 8760 hours.  ProBNP (last 3 results) No results for input(s): PROBNP in the last 8760 hours.  CBG: Recent Labs  Lab 04/05/21 1553 04/05/21 2002 04/05/21 2325 04/06/21 0406 04/06/21 0717  GLUCAP 124* 244* 105* 119* 126*    Recent Results (from the past 240 hour(s))  SARS CORONAVIRUS 2 (TAT 6-24 HRS) Nasopharyngeal Nasopharyngeal Swab     Status: None   Collection Time: 04/02/21 12:49 AM   Specimen: Nasopharyngeal Swab  Result Value Ref Range Status   SARS Coronavirus 2 NEGATIVE NEGATIVE Final    Comment: (NOTE) SARS-CoV-2 target nucleic acids are NOT DETECTED.  The SARS-CoV-2 RNA is generally detectable in upper and lower respiratory specimens during the acute phase of infection. Negative results do not preclude SARS-CoV-2 infection, do not rule out co-infections with other pathogens, and should not be used as the sole basis for treatment or other patient management decisions. Negative results must be combined with clinical observations, patient  history, and epidemiological information. The expected result is Negative.  Fact Sheet for Patients: HairSlick.no  Fact Sheet for Healthcare Providers: quierodirigir.com  This test is not yet approved or cleared by the [place] FDA and  has been authorized for detection and/or diagnosis of SARS-CoV-2 by FDA under an Emergency Use Authorization (EUA). This EUA will remain  in effect (meaning this test can be used) for the duration of the COVID-19 declaration under Se ction 564(b)(1) of the Act, 21 U.S.C. section 360bbb-3(b)(1), unless the authorization is terminated or revoked sooner.  Performed at [facility], [address] [place], [place] [address]  Studies: No results found.   Joycelyn Das, MD  [facility] 04/06/2021  If 7PM-7AM, please contact night-coverage

## 2021-04-06 NOTE — Progress Notes (Signed)
Patient found to have progressive weakness with change in the right leg 2 hours after arrival to PACU. MD Chestine Spore notified when change was noticed. MD to bedside and code stroke called.

## 2021-04-06 NOTE — Progress Notes (Signed)
PT Cancellation Note  Patient Details Name: Jade [name] MRN: [id number] DOB: Apr 12, 1956   Cancelled Treatment:    Reason Eval/Treat Not Completed: Patient at procedure or test/unavailable Pt to OR for TCAR>  Will f/u as able.  Anise Salvo, PT Acute Rehab Services Pager [phone] [facility] [phone]   Jade [name] 04/06/2021, 11:38 AM

## 2021-04-06 NOTE — Transfer of Care (Signed)
Immediate Anesthesia Transfer of Care Note  Patient: Jade [name]  Procedure(s) Performed: LEFT TRANSCAROTID ARTERY REVASCULARIZATION (Left: Neck) ULTRASOUND GUIDANCE FOR VASCULAR ACCESS (Right: Groin)  Patient Location: PACU  Anesthesia Type:General  Level of Consciousness: awake and alert   Airway & Oxygen Therapy: Patient Spontanous Breathing and Patient connected to face mask  Post-op Assessment: Report given to RN and Post -op Vital signs reviewed and stable  Post vital signs: Reviewed and stable  Last Vitals:  Vitals Value Taken Time  BP 173/87 04/06/21 1347  Temp    Pulse 79 04/06/21 1350  Resp 13 04/06/21 1350  SpO2 99 % 04/06/21 1350  Vitals shown include unvalidated device data.  Last Pain:  Vitals:   04/06/21 1105  TempSrc:   PainSc: 0-No pain      Patients Stated Pain Goal: 3 (04/06/21 1105)  Complications: No notable events documented.

## 2021-04-06 NOTE — Progress Notes (Signed)
Pt arrived from PACU. Applied telemetry, CCMD called/verified, CHG bath, gown changed, oriented to unit, NIH assessed. Pt noted to have continued oozing from left neck dressing. Per PACU RN Dr. Lenell Antu is aware and dressing was changed. Will need to be redressed/reinforced. Pt given water to drink and awaiting pudding. Per PACU pt is every 2 hour mNIH. Pt resting with call bell within reach.  Will continue to monitor.

## 2021-04-06 NOTE — Code Documentation (Signed)
Stroke Response Nurse Documentation Code Documentation  Jade [name] is a 65 y.o. female admitted to [facility] on 04/02/21 with past medical hx of CVA, HLD, HTN, DM2. Code stroke was activated by PACU RN  LKW at 1115 when patient in for surgery, post TCAR RN noticed increased right sided weakness. Patient has baseline right sided drift now presenting with severe right sided weakness.    Stroke team at the bedside on patient activation. Patient to CT with team. NIHSS 8, see documentation for details and code stroke times. Patient with right arm weakness, right leg weakness, right decreased sensation, and dysarthria  on exam. The following imaging was completed:  CT head, CTA. Patient is not a candidate for tPA due to being outside of window.   Care/Plan RN  instructed for Q2 NIHSS and vital signs. Bedside handoff with RN Percival Spanish, RN PACU.    Toniann Fail  Stroke Response RN

## 2021-04-06 NOTE — Progress Notes (Signed)
Inpatient Rehab Admissions Coordinator:   Spoke to Daleen Squibb who has talked to family and confirmed 24/7 assist available with son.  Will place a rehab consult per our protocol.   Estill Dooms, PT, DPT Admissions Coordinator [phone] 04/06/21  10:38 AM

## 2021-04-06 NOTE — Anesthesia Procedure Notes (Signed)
Arterial Line Insertion Start/End8/06/2021 11:25 AM, 04/06/2021 11:30 AM Performed by: Adair Laundry, CRNA  Patient location: Pre-op. Preanesthetic checklist: patient identified, IV checked, site marked, risks and benefits discussed, surgical consent, monitors and equipment checked, pre-op evaluation, timeout performed and anesthesia consent Lidocaine 1% used for infiltration Right, radial was placed Catheter size: 20 G Hand hygiene performed  and maximum sterile barriers used   Attempts: 1 Procedure performed without using ultrasound guided technique. Following insertion, Biopatch and dressing applied. Post procedure assessment: normal  Patient tolerated the procedure well with no immediate complications. Additional procedure comments: Inserted by Marthann Schiller SRNA .

## 2021-04-06 NOTE — Anesthesia Postprocedure Evaluation (Signed)
Anesthesia Post Note  Patient: Jade [name]  Procedure(s) Performed: LEFT TRANSCAROTID ARTERY REVASCULARIZATION (Left: Neck) ULTRASOUND GUIDANCE FOR VASCULAR ACCESS (Right: Groin)     Patient location during evaluation: PACU Anesthesia Type: General Level of consciousness: awake and alert Pain management: pain level controlled Vital Signs Assessment: post-procedure vital signs reviewed and stable Respiratory status: spontaneous breathing, nonlabored ventilation, respiratory function stable and patient connected to nasal cannula oxygen Cardiovascular status: blood pressure returned to baseline and stable Postop Assessment: no apparent nausea or vomiting Anesthetic complications: no   No notable events documented.  Last Vitals:  Vitals:   04/06/21 1447 04/06/21 1517  BP: 135/69 118/88  Pulse: 72 78  Resp: 17 16  Temp: (!) 36.3 C   SpO2: 97% 94%    Last Pain:  Vitals:   04/06/21 1347  TempSrc:   PainSc: 0-No pain                 Cecile Hearing

## 2021-04-06 NOTE — OR Nursing (Signed)
Pt. A&O x 4; no mobility in right upper extremity pt states from previous stroke; Also has residual weakness in right lower extremity. Pt. complains of numbness to right corner of mouth with some history of dribbling when she drinks. Symmetric smile, tongue movement and face.  Pt was very adamant about maintaining DNR in OR suite if something was to happen during surgery. She also refuses blood products. Einar Crow, CRNA was present also when pt. Stated this.

## 2021-04-07 ENCOUNTER — Encounter (HOSPITAL_COMMUNITY): Payer: Self-pay | Admitting: Vascular Surgery

## 2021-04-07 DIAGNOSIS — I63533 Cerebral infarction due to unspecified occlusion or stenosis of bilateral posterior cerebral arteries: Secondary | ICD-10-CM

## 2021-04-07 LAB — BASIC METABOLIC PANEL
Anion gap: 8 (ref 5–15)
BUN: 22 mg/dL (ref 8–23)
CO2: 23 mmol/L (ref 22–32)
Calcium: 9.2 mg/dL (ref 8.9–10.3)
Chloride: 107 mmol/L (ref 98–111)
Creatinine, Ser: 1.13 mg/dL — ABNORMAL HIGH (ref 0.44–1.00)
GFR, Estimated: 54 mL/min — ABNORMAL LOW (ref >60)
Glucose, Bld: 131 mg/dL — ABNORMAL HIGH (ref 70–99)
Potassium: 4 mmol/L (ref 3.5–5.1)
Sodium: 138 mmol/L (ref 135–145)

## 2021-04-07 LAB — LIPID PANEL
Cholesterol: 138 mg/dL (ref 0–200)
HDL: 46 mg/dL (ref >40)
LDL Cholesterol: 64 mg/dL (ref 0–99)
Total CHOL/HDL Ratio: 3 RATIO
Triglycerides: 138 mg/dL (ref <150)
VLDL: 28 mg/dL (ref 0–40)

## 2021-04-07 LAB — CBC
HCT: 36.7 % (ref 36.0–46.0)
Hemoglobin: 12.3 g/dL (ref 12.0–15.0)
MCH: 31.9 pg (ref 26.0–34.0)
MCHC: 33.5 g/dL (ref 30.0–36.0)
MCV: 95.1 fL (ref 80.0–100.0)
Platelets: 312 10*3/uL (ref 150–400)
RBC: 3.86 MIL/uL — ABNORMAL LOW (ref 3.87–5.11)
RDW: 13 % (ref 11.5–15.5)
WBC: 11.5 10*3/uL — ABNORMAL HIGH (ref 4.0–10.5)
nRBC: 0 % (ref 0.0–0.2)

## 2021-04-07 LAB — GLUCOSE, CAPILLARY
Glucose-Capillary: 109 mg/dL — ABNORMAL HIGH (ref 70–99)
Glucose-Capillary: 111 mg/dL — ABNORMAL HIGH (ref 70–99)
Glucose-Capillary: 128 mg/dL — ABNORMAL HIGH (ref 70–99)
Glucose-Capillary: 134 mg/dL — ABNORMAL HIGH (ref 70–99)
Glucose-Capillary: 158 mg/dL — ABNORMAL HIGH (ref 70–99)
Glucose-Capillary: 163 mg/dL — ABNORMAL HIGH (ref 70–99)

## 2021-04-07 LAB — MAGNESIUM: Magnesium: 2.3 mg/dL (ref 1.7–2.4)

## 2021-04-07 LAB — PHOSPHORUS: Phosphorus: 4.9 mg/dL — ABNORMAL HIGH (ref 2.5–4.6)

## 2021-04-07 MED ORDER — ASPIRIN EC 325 MG PO TBEC
325.0000 mg | DELAYED_RELEASE_TABLET | Freq: Every day | ORAL | Status: DC
  Administered 2021-04-08 – 2021-04-13 (×6): 325 mg via ORAL
  Filled 2021-04-07 (×6): qty 1

## 2021-04-07 MED ORDER — LABETALOL HCL 5 MG/ML IV SOLN
10.0000 mg | INTRAVENOUS | Status: DC | PRN
  Administered 2021-04-07 – 2021-04-11 (×3): 10 mg via INTRAVENOUS
  Filled 2021-04-07 (×4): qty 4

## 2021-04-07 MED ORDER — AMLODIPINE BESYLATE 10 MG PO TABS
10.0000 mg | ORAL_TABLET | Freq: Every day | ORAL | Status: DC
  Administered 2021-04-07 – 2021-04-13 (×7): 10 mg via ORAL
  Filled 2021-04-07 (×7): qty 1

## 2021-04-07 MED ORDER — HYDRALAZINE HCL 20 MG/ML IJ SOLN
10.0000 mg | INTRAMUSCULAR | Status: DC | PRN
  Administered 2021-04-07 – 2021-04-11 (×2): 10 mg via INTRAVENOUS
  Filled 2021-04-07 (×2): qty 1

## 2021-04-07 MED ORDER — HYDROCHLOROTHIAZIDE 12.5 MG PO CAPS
12.5000 mg | ORAL_CAPSULE | Freq: Every day | ORAL | Status: DC
  Administered 2021-04-07 – 2021-04-13 (×7): 12.5 mg via ORAL
  Filled 2021-04-07 (×7): qty 1

## 2021-04-07 NOTE — Progress Notes (Addendum)
PROGRESS NOTE  Jade [name] HQI:[id number] DOB: 1955/11/05 DOA: 04/01/2021 PCP: System, Provider Not In   LOS: 5 days   Brief narrative:  LAVAUGHN BISIG is a 65 y.o. female with a history of recent CVA, COPD, CKD, hyperlipidemia, hypertension, obesity, diabetes mellitus type 2 presented to hospital with numbness and weakness of the right hand and was noted to have acute left posterior MCA branch CVA likely secondary to severe left ICA stenosis.  Vascular surgery was consulted and patient underwent TCAR on 04/06/2021.  Assessment/Plan:  Principal Problem:   Acute CVA (cerebrovascular accident) (HCC) Active Problems:   Diabetes mellitus type 2 with neurological manifestations (HCC)   Pure hypercholesterolemia   Essential hypertension, benign   Obesity, Class III, BMI 40-49.9 (morbid obesity) (HCC)   TIA (transient ischemic attack)   Carotid artery stenosis with cerebral infarction (HCC)   COPD (chronic obstructive pulmonary disease) (HCC)   Acute CVA History of recent CVA.  Patient did have left ICA stenosis more than 80%.  Neurology and vascular surgery on board.  on aspirin/Plavix, statins.  Status post TCAR on 04/06/2021 with postoperative increased weakness.  MRI postoperatively showed increasing lateral left frontoparietal infarct.  We will follow neurology and vascular surgery recommendations   Left ICA stenosis S/p  TCAR on 04/06/2021.  Management as per vascular surgery.   Hyperlipidemia Continue statins.  Essential hypertension Currently on as needed antihypertensive.  On amlodipine and hydrochlorothiazide as an outpatient.  Will resume.  On as needed hydralazine and labetalol.  Goal is to keep blood pressure between 130-1 60 as per neurology.   Diabetes mellitus, type II with diabetic retinopathy Diet controlled at home.  Hemoglobin A1c was 6.5.  Patient follows with ophthalmology as an outpatient for diabetic retinopathy.  Continue diabetic diet, sliding scale  insulin.  Latest physical visit 128   COPD As needed albuterol.  Compensated.   Obesity Body mass index is 32.12 kg/m.  Would benefit from weight loss as outpatient  DVT prophylaxis: heparin injection 5,000 Units Start: 04/07/21 0600 SCD's Start: 04/06/21 1758 SCD's Start: 04/02/21 0208   Code Status: DNR  Family Communication: None  Status is: Inpatient  Remains inpatient appropriate because: IV treatments appropriate due to intensity of illness or inability to take PO, Inpatient level of care appropriate due to severity of illness, and plan for surgical intervention  Dispo: The patient is from: Home              Anticipated d/c is to: SNF /CIR               Patient currently is not medically stable to d/c.   Difficult to place patient No  Consultants: Neurology Vascular surgery  Procedures: TCAR on 04/06/2021  Anti-infectives:  None  Anti-infectives (From admission, onward)    Start     Dose/Rate Route Frequency Ordered Stop   04/06/21 1845  ceFAZolin (ANCEF) IVPB 2g/100 mL premix        2 g 200 mL/hr over 30 Minutes Intravenous Every 8 hours 04/06/21 1758 04/07/21 0602   04/06/21 0000  ceFAZolin (ANCEF) IVPB 1 g/50 mL premix       Note to Pharmacy: Send with pt to OR   1 g 100 mL/hr over 30 Minutes Intravenous On call 04/05/21 0956 04/06/21 1215       Subjective: Today, patient was seen and examined at bedside.  Complains of dense right upper extremity weakness.  Denies headache nausea vomiting.  Objective: Vitals:   04/07/21 0700  04/07/21 0800  BP: (!) 158/59 (!) 163/48  Pulse: (!) 51 (!) 49  Resp: 16 15  Temp:    SpO2: 95% 94%    Intake/Output Summary (Last 24 hours) at 04/07/2021 0923 Last data filed at 04/07/2021 0653 Gross per 24 hour  Intake 1250 ml  Output 1550 ml  Net -300 ml    Filed Weights   04/01/21 2010 04/04/21 0500 04/06/21 1033  Weight: 77.1 kg 78.1 kg 76.2 kg   Body mass index is 31.74 kg/m.   Physical Exam:  General:  Obese built, not in obvious distress HENT:   No scleral pallor or icterus noted. Oral mucosa is moist.  Chest:  Clear breath sounds.  Diminished breath sounds bilaterally. No crackles or wheezes.  CVS: S1 &S2 heard. No murmur.  Regular rate and rhythm. Abdomen: Soft, nontender, nondistended.  Bowel sounds are heard.   Extremities: No cyanosis, clubbing or edema.  Peripheral pulses are palpable. Psych: Alert, awake and oriented, normal mood CNS:  No cranial nerve deficits, right-sided weakness.  Dense weakness on the right upper extremity than right lower extremity.,  Speech distinct Skin: Warm and dry.  No rashes noted.  Data Review: I have personally reviewed the following laboratory data and studies,  CBC: Recent Labs  Lab 04/01/21 2022 04/06/21 0345 04/07/21 0500  WBC 10.5 9.5 11.5*  NEUTROABS 7.8*  --   --   HGB 15.0 14.4 12.3  HCT 43.2 43.4 36.7  MCV 93.7 95.4 95.1  PLT 365 317 312    Basic Metabolic Panel: Recent Labs  Lab 04/01/21 2022 04/02/21 0411 04/03/21 2001 04/06/21 0345 04/07/21 0500  NA 139  --   --  142 138  K 3.3*  --   --  3.5 4.0  CL 106  --   --  107 107  CO2 22  --   --  24 23  GLUCOSE 168*  --   --  130* 131*  BUN 21  --   --  24* 22  CREATININE 0.96  --   --  0.98 1.13*  CALCIUM 10.0  --   --  9.5 9.2  MG  --  2.0 1.9 2.3 2.3  PHOS  --  3.1  --   --  4.9*    Liver Function Tests: Recent Labs  Lab 04/01/21 2022  AST 23  ALT 17  ALKPHOS 75  BILITOT 1.0  PROT 7.1  ALBUMIN 4.0    No results for input(s): LIPASE, AMYLASE in the last 168 hours. No results for input(s): AMMONIA in the last 168 hours. Cardiac Enzymes: No results for input(s): CKTOTAL, CKMB, CKMBINDEX, TROPONINI in the last 168 hours. BNP (last 3 results) No results for input(s): BNP in the last 8760 hours.  ProBNP (last 3 results) No results for input(s): PROBNP in the last 8760 hours.  CBG: Recent Labs  Lab 04/06/21 1821 04/06/21 2017 04/07/21 0023  04/07/21 0447 04/07/21 0852  GLUCAP 164* 178* 163* 134* 128*    Recent Results (from the past 240 hour(s))  SARS CORONAVIRUS 2 (TAT 6-24 HRS) Nasopharyngeal Nasopharyngeal Swab     Status: None   Collection Time: 04/02/21 12:49 AM   Specimen: Nasopharyngeal Swab  Result Value Ref Range Status   SARS Coronavirus 2 NEGATIVE NEGATIVE Final    Comment: (NOTE) SARS-CoV-2 target nucleic acids are NOT DETECTED.  The SARS-CoV-2 RNA is generally detectable in upper and lower respiratory specimens during the acute phase of infection. Negative results do not preclude  SARS-CoV-2 infection, do not rule out co-infections with other pathogens, and should not be used as the sole basis for treatment or other patient management decisions. Negative results must be combined with clinical observations, patient history, and epidemiological information. The expected result is Negative.  Fact Sheet for Patients: HairSlick.no  Fact Sheet for Healthcare Providers: quierodirigir.com  This test is not yet approved or cleared by the [place] FDA and  has been authorized for detection and/or diagnosis of SARS-CoV-2 by FDA under an Emergency Use Authorization (EUA). This EUA will remain  in effect (meaning this test can be used) for the duration of the COVID-19 declaration under Se ction 564(b)(1) of the Act, 21 U.S.C. section 360bbb-3(b)(1), unless the authorization is terminated or revoked sooner.  Performed at [facility], [address], [place], [place] [address]   Surgical pcr screen     Status: None   Collection Time: 04/06/21 10:01 AM   Specimen: Nasal Mucosa; Nasal Swab  Result Value Ref Range Status   MRSA, PCR NEGATIVE NEGATIVE Final   Staphylococcus aureus NEGATIVE NEGATIVE Final    Comment: (NOTE) The Xpert SA Assay (FDA approved for NASAL specimens in patients 52 years of age and older), is one component of a  comprehensive surveillance program. It is not intended to diagnose infection nor to guide or monitor treatment. Performed at [facility], [address] [place], [place] [address]       Studies: MR BRAIN WO CONTRAST  Result Date: 04/07/2021 CLINICAL DATA:  Follow-up examination for stroke. EXAM: MRI HEAD WITHOUT CONTRAST TECHNIQUE: Multiplanar, multiecho pulse sequences of the brain and surrounding structures were obtained without intravenous contrast. COMPARISON:  CTs from earlier the same day as well as prior MRI from 04/01/2021. FINDINGS: Brain: Examination degraded by motion. Age-related cerebral atrophy with chronic microvascular ischemic disease. Scattered remote lacunar infarcts noted about the basal ganglia and left thalamus. There has been interval enlargement/expansion of the previously identified infarct involving the posterior left frontoparietal region, postcentral gyrus, increased in size as compared to prior. Area of infarction now measures up to approximately 5 cm in size. No associated hemorrhage or significant mass effect. Additionally, there are a few new acute ischemic infarcts involving the anterior left temporal lobe (series 3, image 21), right thalamic capsular region (series 3, image 26) and left parieto-occipital region (series 3, image 30). No associated hemorrhage or mass effect about these new areas of infarction. Note again made of a small apparent focus of diffusion signal abnormality adjacent to the frontal horn of the left lateral ventricle, favored to be artifactual nature due to adjacent susceptibility artifact at this location, stable (series 7, image 72). No mass lesion or significant mass effect. Benign arachnoid cyst at the right middle cranial fossa noted. Ventricular prominence related to underlying global parenchymal volume loss without hydrocephalus. No extra-axial fluid collection. Pituitary gland within normal limits. Vascular: Major intracranial  vascular flow voids are maintained. Skull and upper cervical spine: Craniocervical junction within normal limits. Degenerative spondylosis at C3-4 with resultant mild spinal stenosis. Bone marrow signal intensity normal. No scalp soft tissue abnormality. Sinuses/Orbits: Globes and orbital soft tissues demonstrate no acute finding. Air-fluid level noted within the left sphenoid sinus. Paranasal sinuses are otherwise clear. No mastoid effusion. Other: None. IMPRESSION: 1. Interval enlargement/expansion of the previously identified acute posterior left frontoparietal infarct as compared to recent MRI from 04/01/2021. No associated hemorrhage or significant mass effect. 2. Few additional new small acute ischemic nonhemorrhagic infarcts involving  the anterior left temporal lobe, right thalamocapsular region, and left parieto-occipital region. No mass effect. 3. Otherwise stable MRI with underlying atrophy and chronic microvascular ischemic disease. Electronically Signed   By: Rise MuBenjamin  McClintock M.D.   On: 04/07/2021 02:26   CT HEAD CODE STROKE WO CONTRAST  Result Date: 04/06/2021 CLINICAL DATA:  Code stroke. Postprocedure altered mental status. Acute stroke suspected. EXAM: CT HEAD WITHOUT CONTRAST TECHNIQUE: Contiguous axial images were obtained from the base of the skull through the vertex without intravenous contrast. COMPARISON:  CT and MRI studies 4-5 days ago. FINDINGS: Brain: Brainstem and cerebellum are unremarkable. Cerebral hemispheres show a chronic arachnoid cyst at the anterior middle cranial fossa on the right. There is generalized atrophy with chronic small-vessel ischemic changes of the white matter and old small vessel infarctions of the thalami and basal ganglia. Subacute infarction at the right posterior frontal vertex is now visible as cortical low density. No evidence of swelling or hemorrhage. No new insult is identified. Vascular: There is atherosclerotic calcification of the major vessels at  the base of the brain. Skull: Negative Sinuses/Orbits: Small fluid level in the sphenoid sinus. Other sinuses clear. Orbits negative. Other: None ASPECTS ([place]berta Stroke Program Early CT Score) - Ganglionic level infarction (caudate, lentiform nuclei, internal capsule, insula, M1-M3 cortex): 7 - Supraganglionic infarction (M4-M6 cortex): 2 Total score (0-10 with 10 being normal): 9 IMPRESSION: 1. Early subacute infarction at the left posterior frontal cortical brain, the same area seen affected on the MRI scan of 5 days ago. Chronic small-vessel ischemic changes elsewhere as described. No evidence of hemorrhage or mass effect. 2. ASPECTS is 9 3. These results were communicated to Dr. Roda ShuttersXu at 4:48 pm on 04/06/2021 by text page via the [facility] messaging system. Electronically Signed   By: Paulina FusiMark  Shogry M.D.   On: 04/06/2021 16:50   Structural Heart Procedure  Result Date: 04/06/2021 See surgical note for result.  HYBRID OR IMAGING ([facility] ONLY)  Result Date: 04/06/2021 There is no interpretation for this exam.  This order is for images obtained during a surgical procedure.  Please See "Surgeries" Tab for more information regarding the procedure.   CT ANGIO HEAD NECK W WO CM (CODE STROKE)  Result Date: 04/06/2021 CLINICAL DATA:  Stroke follow-up. EXAM: CT ANGIOGRAPHY HEAD AND NECK TECHNIQUE: Multidetector CT imaging of the head and neck was performed using the standard protocol during bolus administration of intravenous contrast. Multiplanar CT image reconstructions and MIPs were obtained to evaluate the vascular anatomy. Carotid stenosis measurements (when applicable) are obtained utilizing NASCET criteria, using the distal internal carotid diameter as the denominator. CONTRAST:  100mL OMNIPAQUE IOHEXOL 350 MG/ML SOLN COMPARISON:  04/02/2021 FINDINGS: CTA NECK FINDINGS Aortic arch: Standard 3 vessel aortic arch. Moderate calcified plaque in the proximal left subclavian artery without flow limiting stenosis. Right  carotid system: Patent with unchanged calcified plaque at the carotid bifurcation resulting in less than 50% ICA origin stenosis. No dissection. Left carotid system: A stent has been placed extending from the distal common carotid artery into the proximal ICA. The stent is patent with 45% narrowing in its midportion. Vertebral arteries: The vertebral arteries are patent and codominant without evidence of a dissection. Atherosclerotic plaque is noted at the right vertebral artery origin without evidence of a significant stenosis. Skeleton: Interbody ankylosis at C3-4. Other neck: Postoperative changes in the left neck including gas, fluid, and small volume hematoma. Upper chest: New band like opacity in the left upper lobe, likely atelectasis. Review of the  MIP images confirms the above findings CTA HEAD FINDINGS Anterior circulation: The internal carotid arteries are patent from skull base to carotid termini with mild atherosclerotic plaque bilaterally not resulting in a significant stenosis. ACAs and MCAs are patent without evidence of a significant A1 or M1 stenosis. There is widespread atherosclerotic branch vessel irregularity including unchanged severe proximal right M2 and left M3 branch stenoses. No aneurysm is identified. Posterior circulation: The intracranial vertebral arteries are patent to the basilar with atherosclerosis resulting in luminal irregularity on the right but no significant stenosis. The basilar artery is widely patent. The PCAs are patent with severe bilateral P2 stenoses again noted. No aneurysm is identified. Venous sinuses: As permitted by contrast timing, patent. Anatomic variants: None. Review of the MIP images confirms the above findings IMPRESSION: 1. No emergent large vessel occlusion. 2. Interval left carotid stenting. The stent is patent with a 45% stenosis in its midportion. 3. Intracranial atherosclerosis include unchanged severe bilateral P2 and bilateral MCA branch vessel  stenoses. 4. Aortic Atherosclerosis (ICD10-I70.0). Electronically Signed   By: Sebastian Ache M.D.   On: 04/06/2021 17:26     Joycelyn Das, MD  [facility] 04/07/2021  If 7PM-7AM, please contact night-coverage

## 2021-04-07 NOTE — Progress Notes (Signed)
Occupational Therapy Treatment Patient Details Name: Jade [name] MRN: [id number] DOB: October 11, 1955 Today's Date: 04/07/2021    History of present illness Pt is a 65 y.o. female who presented 04/01/21 with R-sided weakness. MRI reveals an embolic appearing stroke on the left, affecting a posterior MCA branch. Imaging revealed severe L ICA origin stenosis. Of note, a few weeks ago pt had stroke affecting her vision and R-hand strength, MRI then revealed acute L thalamic , anterior left frontal and possible right frontal and multiple remote lacunar infarcts. 8/11 pt s/p Left transcarotid artery revascularization (TCAR) with worsening right sided weakness immediately post-op with MRI evidence of expansion of previous left frontoparietal infarct. PMH: arthritis, CKD, COPD, CVA, fibromyalgia, gout, HTN, migraine, OSA, DM2   OT comments  This 65 yo female admitted with above with new expansion of previous infarcts seen on MRI yesterday presents to acute OT with taping of glasses modified today giving her more light coming in on left side and less coverage of lens on left side. She also showed some AROM (1-1+/5 with RUE shoulder and forearm). She will continue to benefit from acute OT with follow up on CIR.  Follow Up Recommendations  CIR;Supervision/Assistance - 24 hour    Equipment Recommendations  Other (comment) (TBD Next venue)    Recommendations for Other Services Rehab consult    Precautions / Restrictions Precautions Precautions: Fall Precaution Comments: post CVA right hemiplegia Restrictions Weight Bearing Restrictions: No                 Vision Baseline Vision/History: Wears glasses Wears Glasses: At all times Patient Visual Report: Blurring of vision Additional Comments: Pt with fully taped left lens of glasses with hyperfix tape. Tape removed and let pt see how no tape was as of now and she said still blurry/double, used transpore tape on ~2/3 of left glass lens and pt  reported this was much better              Cognition Arousal/Alertness: Awake/alert Behavior During Therapy: WFL for tasks assessed/performed Overall Cognitive Status: Within Functional Limits for tasks assessed                                 General Comments: Pt does have trouble with speech and alwasys getting her point across due to this        Exercises Other Exercises Other Exercises: While seated in recliner pt worked with me on shoulder flexion and extension of RUE with full weight of arm supported. Pt demonstrating 1-1+/5 movement for this. Pt unable to flex or extend elbow, showed 1-1+/5 pronation. No AROM noted in wrist or digits. Pt with increased tone at times in RUE as she attemptd movements asked of her. Continue to monitor RUE for any need for resting hand splint at night.                                                                     Frequency  Min 2X/week        Progress Toward Goals  OT Goals(current goals can now be found in the care plan section)  Progress towards OT goals: Progressing toward goals  Acute Rehab OT  Goals Patient Stated Goal: to be able to go to rehab and then home OT Goal Formulation: With patient Time For Goal Achievement: 04/16/21 Potential to Achieve Goals: Good ADL Goals Additional ADL Goal #3: Monitor for need for RUE resting hand splint  Plan Frequency remains appropriate;Discharge plan remains appropriate                     AM-PAC OT "6 Clicks" Daily Activity     Outcome Measure   Help from another person eating meals?: A Little Help from another person taking care of personal grooming?: A Lot Help from another person toileting, which includes using toliet, bedpan, or urinal?: A Lot Help from another person bathing (including washing, rinsing, drying)?: A Lot Help from another person to put on and taking off regular upper body clothing?: A Lot Help from another person  to put on and taking off regular lower body clothing?: Total 6 Click Score: 12    End of Session    OT Visit Diagnosis: Other abnormalities of gait and mobility (R26.89);Muscle weakness (generalized) (M62.81);Low vision, both eyes (H54.2);Hemiplegia and hemiparesis Hemiplegia - Right/Left: Right Hemiplegia - dominant/non-dominant: Dominant Hemiplegia - caused by: Cerebral infarction   Activity Tolerance Patient tolerated treatment well   Patient Left in chair;with call bell/phone within reach             Time: 1093-2355 OT Time Calculation (min): 28 min  Charges: OT General Charges $OT Visit: 1 Visit OT Treatments $Therapeutic Activity: 8-22 mins $Therapeutic Exercise: 8-22 mins  Ignacia Palma, OTR/L Acute Rehab Services Pager [phone] Office [phone]     Evette Georges 04/07/2021, 3:49 PM

## 2021-04-07 NOTE — Progress Notes (Addendum)
PHARMACIST LIPID MONITORING   Jade [name] is a 65 y.o. female admitted on 04/01/2021 with carotid stenosis in the setting of new CVA with history of multiple CVAs.  Pharmacy has been consulted to optimize lipid-lowering therapy with the indication of secondary prevention for clinical ASCVD.  Recent Labs:  Lipid Panel (last 6 months):   Lab Results  Component Value Date   CHOL 138 04/07/2021   TRIG 138 04/07/2021   HDL 46 04/07/2021   CHOLHDL 3.0 04/07/2021   VLDL 28 04/07/2021   LDLCALC 64 04/07/2021    Hepatic function panel (last 6 months):   Lab Results  Component Value Date   AST 23 04/01/2021   ALT 17 04/01/2021   ALKPHOS 75 04/01/2021   BILITOT 1.0 04/01/2021    SCr (since admission):   Serum creatinine: 1.13 mg/dL (H) 42/70/62 3762 Estimated creatinine clearance: 45.8 mL/min (A)  Current therapy and lipid therapy tolerance Current lipid-lowering therapy: atorvastatin 80mg , started March 06, 2021 Previous lipid-lowering therapies (if applicable): fish oil Documented or reported allergies or intolerances to lipid-lowering therapies (if applicable): none  Assessment:   Patient prefers no changes in lipid-lowering therapy at this time due to dislike of statins.  Per July 12 note, patient was agreeable to take statins in the hospital but will not take it at home because she does not believe in them. Just started statin last month and LDL now at goal.   Plan:    1.Statin intensity (high intensity recommended for all patients regardless of the LDL):  No statin changes. The patient is already on a high intensity statin.  2.Add ezetimibe (if any one of the following):   Not indicated at this time.  3.Refer to lipid clinic:   No  4.Follow-up with:  Primary care provider - System, Provider Not In  5.Follow-up labs after discharge:  No changes in lipid therapy, repeat a lipid panel in one year.     July 14, PharmD, BCPS, BCCP Clinical Pharmacist  Please check  AMION for all [facility] phone numbers After 10:00 PM, call Main Pharmacy [phone]

## 2021-04-07 NOTE — Progress Notes (Signed)
STROKE TEAM PROGRESS NOTE   INTERVAL HISTORY No family at the bedside.  Patient sitting in chair, stated that her right leg strength seems improved overnight but she also felt some left hand numbness overnight.  MRI done showed left paracentral gyrus increased stroke done the last MRI but compatible with CT yesterday.  However, on MRI, patient does have bilateral punctate PCA infarcts, consistent with bilateral PCA high-grade stenosis.  This MRI changes concerning for new strokes due to intracranial stenosis in the setting of hypotension.  Vitals:   04/07/21 1620 04/07/21 1642 04/07/21 1700 04/07/21 1909  BP: (!) 172/80 (!) 208/82 (!) 180/76 (!) 182/75  Pulse: (!) 51 (!) 57 70 72  Resp: 20 16 19 20   Temp: 98.2 F (36.8 C)     TempSrc: Oral     SpO2: 99% 98% 99% 99%  Weight:      Height:       CBC:  Recent Labs  Lab 04/01/21 2022 04/06/21 0345 04/07/21 0500  WBC 10.5 9.5 11.5*  NEUTROABS 7.8*  --   --   HGB 15.0 14.4 12.3  HCT 43.2 43.4 36.7  MCV 93.7 95.4 95.1  PLT 365 317 312   Basic Metabolic Panel:  Recent Labs  Lab 04/02/21 0411 04/03/21 2001 04/06/21 0345 04/07/21 0500  NA  --   --  142 138  K  --   --  3.5 4.0  CL  --   --  107 107  CO2  --   --  24 23  GLUCOSE  --   --  130* 131*  BUN  --   --  24* 22  CREATININE  --   --  0.98 1.13*  CALCIUM  --   --  9.5 9.2  MG 2.0   < > 2.3 2.3  PHOS 3.1  --   --  4.9*   < > = values in this interval not displayed.   Lipid Panel:  Recent Labs  Lab 04/07/21 0500  CHOL 138  TRIG 138  HDL 46  CHOLHDL 3.0  VLDL 28  LDLCALC 64   HgbA1c:  Recent Labs  Lab 04/01/21 2020  HGBA1C 6.5*   Urine Drug Screen:  Recent Labs  Lab 04/01/21 2104  LABOPIA NONE DETECTED  COCAINSCRNUR NONE DETECTED  LABBENZ NONE DETECTED  AMPHETMU NONE DETECTED  THCU NONE DETECTED  LABBARB NONE DETECTED    Alcohol Level  Recent Labs  Lab 04/01/21 2022  ETH <10    IMAGING past 24 hours MR BRAIN WO CONTRAST  Result Date:  04/07/2021 CLINICAL DATA:  Follow-up examination for stroke. EXAM: MRI HEAD WITHOUT CONTRAST TECHNIQUE: Multiplanar, multiecho pulse sequences of the brain and surrounding structures were obtained without intravenous contrast. COMPARISON:  CTs from earlier the same day as well as prior MRI from 04/01/2021. FINDINGS: Brain: Examination degraded by motion. Age-related cerebral atrophy with chronic microvascular ischemic disease. Scattered remote lacunar infarcts noted about the basal ganglia and left thalamus. There has been interval enlargement/expansion of the previously identified infarct involving the posterior left frontoparietal region, postcentral gyrus, increased in size as compared to prior. Area of infarction now measures up to approximately 5 cm in size. No associated hemorrhage or significant mass effect. Additionally, there are a few new acute ischemic infarcts involving the anterior left temporal lobe (series 3, image 21), right thalamic capsular region (series 3, image 26) and left parieto-occipital region (series 3, image 30). No associated hemorrhage or mass effect about these new areas  of infarction. Note again made of a small apparent focus of diffusion signal abnormality adjacent to the frontal horn of the left lateral ventricle, favored to be artifactual nature due to adjacent susceptibility artifact at this location, stable (series 7, image 72). No mass lesion or significant mass effect. Benign arachnoid cyst at the right middle cranial fossa noted. Ventricular prominence related to underlying global parenchymal volume loss without hydrocephalus. No extra-axial fluid collection. Pituitary gland within normal limits. Vascular: Major intracranial vascular flow voids are maintained. Skull and upper cervical spine: Craniocervical junction within normal limits. Degenerative spondylosis at C3-4 with resultant mild spinal stenosis. Bone marrow signal intensity normal. No scalp soft tissue abnormality.  Sinuses/Orbits: Globes and orbital soft tissues demonstrate no acute finding. Air-fluid level noted within the left sphenoid sinus. Paranasal sinuses are otherwise clear. No mastoid effusion. Other: None. IMPRESSION: 1. Interval enlargement/expansion of the previously identified acute posterior left frontoparietal infarct as compared to recent MRI from 04/01/2021. No associated hemorrhage or significant mass effect. 2. Few additional new small acute ischemic nonhemorrhagic infarcts involving the anterior left temporal lobe, right thalamocapsular region, and left parieto-occipital region. No mass effect. 3. Otherwise stable MRI with underlying atrophy and chronic microvascular ischemic disease. Electronically Signed   By: Rise Mu M.D.   On: 04/07/2021 02:26    PHYSICAL EXAM Constitutional: Appears well-developed and well-nourished. Psych: Affect appropriate to situation Eyes: No scleral injection HENT: No OP obstruction MSK: no joint deformities. Cardiovascular: Normal rate and regular rhythm. Respiratory: Effort normal, non-labored breathing GI: Soft.  No distension. There is no tenderness. Skin: WDI   Neuro: patient awake alert, eyes open, orientated to age place and time.  No aphasia, able to name repeat, follow simple commands.  No gaze palsy, tracking bilaterally, visual field full, PERRL.  No facial droop, tongue midline.  Left upper and lower extremity no drift, right upper extremity 2/5 proximal and 0/5 distal.  Right lower extremity 3-/5 proximal and distal.  Sensation symmetrical, left finger-to-nose mild dysmetria.    ASSESSMENT/PLAN  Jade [name] is a 65 y.o. female with a history of hypertension, hyperlipidemia, known carotid disease (refused statins and CEA in the past)  who was recently hospitalized July 10-19th for after presenting with blurred vision and hypertensive emergency found to have a small ischemic infarct of the thalamus was discharged on dual antiplatelet  therapy. On 8/6 at 2:30 PM she got up off the toilet and noticed that her right hand and  right leg were weak.  She required assistance to get of the bathroom and to a chair. Her right leg improved somewhat but her arm remained weak and therefore she presented to the emergency department.  In the emergency department an MRI revealed an embolic appearing stroke on the left.  Stroke - extension of acute infarct at the left precentral gyrus, new b/l PCA small infarcts, etiology likely due to intracranial stenosis in the setting of fluctuating BP CT head 8/6 No acute abnormality.   CTA head & neck 8/7 - 75% stenosis of the proximal ICA on the left. B/l PCA high grade stenosis.  MRI 8/6 small acute infarct at the left frontoparietal junction. Additional small infarct adjacent to the frontal horn of the left lateral ventricle with petechial hemorrhage.  CT head 8/11 Early subacute infarction at the left posterior frontal cortical brain CTA head and neck - unchanged from last CTA but interval s/p left ICA stent, patent with 45% stenosis MRI 8/12 - Interval enlargement/expansion of the previously identified  acute posterior left frontoparietal infarct as compared to recent MRI from 04/01/2021. Few additional new small acute ischemic nonhemorrhagic infarcts involving the anterior left temporal lobe, right thalamocapsular region, and left parieto-occipital region.  2D Echo performed 03/06/21 - EF 55-60%   LDL 78 HgbA1c 6.5 VTE prophylaxis - heparin subq On DAPT with ASA 81mg  and Plavix 75 prior to admission.  Now continue DAPT with aspirin 325 and Plavix 75 for 3 months and then Plavix alone given intracranial stenosis. Therapy recommendations:  SNF Disposition:  pending   Hx of stroke Admitted in 02/2021 for blurry vision and hypertension, MRI showed left thalamic small infarcts.  CTA head and neck showed left ICA 75% stenosis.  MRA head showed severe distal right M2, and right P2 stenosis.  A1c 6.4, LDL 184.   EF 55 to 60%, creatinine 1.10.  She was discharged with aspirin 81 Plavix 75 DAPT for 3 weeks and then Plavix alone, as well as Lipitor 80. Admitted 02/2018 with left thalamic/internal capsule infarct.  EF 60 to 65%, LDL 130, A1c 5.8.  CT head and neck at that time left ICA 80% stenosis.  Referred her to vascular surgery and was planning to do left CEA, however patient canceled the procedure herself.  She then followed with her PCP and referred to see a neurologist Dr. 03/2018 in [place], had carotid Doppler on 02/20/2021 which reported as unremarkable.  Carotid artery stenosis 75% stenosis of the proximal ICA on the left. Vascular surgery, Dr. 02/22/2021, s/p TCAR 8/11. Post procedure concerning for increased right LE weakness. However, the stat CT already showed expansion of left paracentral gyrus infarct and comparable with the stroke size of MRI, concerning for stroke expansion prior to procedure due to fluctuating BP.  Hypertension Home meds:  Norvasc, microzie Fluctuating BP BP goal less than 160 post carotid revascularization Long-term BP goal 130-150 given intracranial stenosis.  Hyperlipidemia Home meds:  Lipitor 80mg  LDL 78, almost at goal < 70 Now on Lipitor 80 Continue high intensity statin at discharge  Diabetes type II Controlled Home meds:  None HgbA1c 6.5, goal < 7.0 CBGs SSI  Other Stroke Risk Factors Advanced Age  Former Cigarette smoker, quit 33 years ago with 20 year pack history  Obesity, Body mass index is 31.74 kg/m., BMI >/= 30 associated with increased stroke risk, recommend weight loss, diet and exercise as appropriate  Family hx stroke CAD OSA not on CPAP  Other Active Problems   Hospital day # 5  Neurology will sign off. Please call with questions. Pt will follow up with stroke clinic Dr. 10/[facility] at [facility] on 06/14/2021. Thanks for the consult.  [facility], MD PhD Stroke Neurology 04/07/2021 7:33 PM    To contact Stroke Continuity provider, please  refer to [facility] Plan. After hours, contact General Neurology

## 2021-04-07 NOTE — Progress Notes (Signed)
Pt passed yale swallow study.

## 2021-04-07 NOTE — Progress Notes (Signed)
Physical Therapy Treatment Patient Details Name: Jade [name] MRN: [id number] DOB: Jun 04, 1956 Today's Date: 04/07/2021    History of Present Illness Pt is a 65 y.o. female who presented 04/01/21 with R-sided weakness. MRI reveals an embolic appearing stroke on the left, affecting a posterior MCA branch. Imaging revealed severe L ICA origin stenosis. Of note, a few weeks ago pt had stroke affecting her vision and R-hand strength, MRI then revealed acute L thalamic , anterior left frontal and possible right frontal and multiple remote lacunar infarcts. 8/11 pt s/p Left transcarotid artery revascularization (TCAR) with worsening right sided weakness immediately post-op with MRI evidence of expansion of previous left frontoparietal infarct. PMH: arthritis, CKD, COPD, CVA, fibromyalgia, gout, HTN, migraine, OSA, DM2    PT Comments    Pt with worsening of stroke symptoms since last treatment. Goals updated appropriately. Pt remains motivated to maximize independence.    Follow Up Recommendations  CIR;Supervision/Assistance - 24 hour     Equipment Recommendations  Wheelchair (measurements PT);Wheelchair cushion (measurements PT)    Recommendations for Other Services       Precautions / Restrictions Precautions Precautions: Fall Precaution Comments: post CVA right hemiplegia Restrictions Weight Bearing Restrictions: No    Mobility  Bed Mobility Overal bed mobility: Needs Assistance Bed Mobility: Supine to Sit     Supine to sit: Max assist;HOB elevated     General bed mobility comments: Assist to bring RLE off of bed, elevate trunk into sitting and bring hips to EOB    Transfers Overall transfer level: Needs assistance Equipment used: Ambulation equipment used Transfers: Sit to/from Stand Sit to Stand: +2 physical assistance;Mod assist         General transfer comment: Assist to bring hips up and for balance. Assist to extend rt knee, rt hip and trunk. Pt with rt lateral  lean. Stood from bed with stedy and used stedy to transfer to recliner.  Ambulation/Gait             General Gait Details: Unable   Social research officer, government Rankin (Stroke Patients Only)       Balance Overall balance assessment: Needs assistance Sitting-balance support: Single extremity supported;Feet supported Sitting balance-Leahy Scale: Poor Sitting balance - Comments: min guard for static sitting with UE support   Standing balance support: Bilateral upper extremity supported Standing balance-Leahy Scale: Poor Standing balance comment: Stood x 2 with Stedy with +2 mod assist to stand 30 sec each. Facilitation for midline and for rt side extension. rt lateral lean                            Cognition Arousal/Alertness: Awake/alert Behavior During Therapy: WFL for tasks assessed/performed Overall Cognitive Status: Within Functional Limits for tasks assessed                                 General Comments: Pt does have trouble with speech and alwasys getting her point across due to this      Exercises Other Exercises Other Exercises: While seated in recliner pt worked with me on shoulder flexion and extension of RUE with full weight of arm supported. Pt demonstrating 1-1+/5 movement for this. Pt unable to flex or extend elbow, showed 1-1+/5 pronation. No AROM noted in wrist or digits. Pt with increased tone at  times in RUE as she attemptd movements asked of her. Continue to monitor RUE for any need for resting hand splint at night.    General Comments General comments (skin integrity, edema, etc.): VSS      Pertinent Vitals/Pain Pain Assessment: No/denies pain    Home Living                      Prior Function            PT Goals (current goals can now be found in the care plan section) Acute Rehab PT Goals Patient Stated Goal: to be able to go to rehab and then home PT Goal Formulation:  With patient Time For Goal Achievement: 04/21/21 Potential to Achieve Goals: Fair Progress towards PT goals: Goals downgraded-see care plan    Frequency    Min 4X/week      PT Plan Current plan remains appropriate    Co-evaluation              AM-PAC PT "6 Clicks" Mobility   Outcome Measure  Help needed turning from your back to your side while in a flat bed without using bedrails?: A Lot Help needed moving from lying on your back to sitting on the side of a flat bed without using bedrails?: A Lot Help needed moving to and from a bed to a chair (including a wheelchair)?: Total Help needed standing up from a chair using your arms (e.g., wheelchair or bedside chair)?: Total Help needed to walk in hospital room?: Total Help needed climbing 3-5 steps with a railing? : Total 6 Click Score: 8    End of Session Equipment Utilized During Treatment: Gait belt Activity Tolerance: Patient tolerated treatment well Patient left: in chair;with call bell/phone within reach;with chair alarm set Nurse Communication: Mobility status;Need for lift equipment PT Visit Diagnosis: Other abnormalities of gait and mobility (R26.89);Hemiplegia and hemiparesis Hemiplegia - Right/Left: Right Hemiplegia - dominant/non-dominant: Dominant Hemiplegia - caused by: Cerebral infarction     Time: 5427-0623 PT Time Calculation (min) (ACUTE ONLY): 25 min  Charges:  $Therapeutic Activity: 23-37 mins                     [facility] PT Acute Rehabilitation Services Pager [phone] Office [phone]    Angelina Ok [facility] 04/07/2021, 5:47 PM

## 2021-04-07 NOTE — Progress Notes (Addendum)
Inpatient Rehabilitation Admissions Coordinator   Inpatient rehab consult received. I met with patient at bedside . I am familair with patient from her previous admission. At that time we recommended SNF, for she did not have caregiver supports at home with repeated falls. Her husband works, and son lives across the street for the past few weeks due to his converted bus is inoperable at this time. He has been assisting her since last admission when she declined SNF admit. I have asked her to discus with her son and spouse what assistance they can provide and that I will follow up Monday for further clarification. I have provided my contact information for her son to call me also.  I will follow up Monday. Unless she has 24/7 physical assistance in her home, she will need SNF rehab.  Danne Baxter, RN, MSN Rehab Admissions Coordinator [phone] 04/07/2021 11:08 AM

## 2021-04-07 NOTE — Progress Notes (Addendum)
Progress Note    04/07/2021 7:22 AM 1 Day Post-Op  Subjective:  Sleeping...awakens easily. Denies pain.  My first exam of patient>>notes reviewed from yesterday. Vitals:   04/07/21 0400 04/07/21 0500  BP: (!) 149/54   Pulse: 60 60  Resp: 14   Temp: 97.8 F (36.6 C)   SpO2: 92%     Physical Exam: General appearance: Awake, alert in no apparent distress Neurologic: Alert and oriented x4, tongue midline, face symmetric, grip strength 0/5 right hand, 5/5 left hand. Unable to raise arm from bed. Able to lift right leg about 2 inches from bed. Cardiac: Heart rate and rhythm are regular Respirations: Nonlabored Incision: Well approximated without bleeding or hematoma.  Subcutaneous tissue soft to palpation Groin puncture site: Soft without hematoma or bleeding   Sys BP 140s-160s  MRI 04/06/2021 IMPRESSION: 1. Interval enlargement/expansion of the previously identified acute posterior left frontoparietal infarct as compared to recent MRI from 04/01/2021. No associated hemorrhage or significant mass effect. 2. Few additional new small acute ischemic nonhemorrhagic infarcts involving the anterior left temporal lobe, right thalamocapsular region, and left parieto-occipital region. No mass effect. 3. Otherwise stable MRI with underlying atrophy and chronic microvascular ischemic disease.    CBC    Component Value Date/Time   WBC 11.5 (H) 04/07/2021 0500   RBC 3.86 (L) 04/07/2021 0500   HGB 12.3 04/07/2021 0500   HCT 36.7 04/07/2021 0500   PLT 312 04/07/2021 0500   MCV 95.1 04/07/2021 0500   MCH 31.9 04/07/2021 0500   MCHC 33.5 04/07/2021 0500   RDW 13.0 04/07/2021 0500   LYMPHSABS 1.7 04/01/2021 2022   MONOABS 0.8 04/01/2021 2022   EOSABS 0.1 04/01/2021 2022   BASOSABS 0.1 04/01/2021 2022    BMET    Component Value Date/Time   NA 138 04/07/2021 0500   K 4.0 04/07/2021 0500   CL 107 04/07/2021 0500   CO2 23 04/07/2021 0500   GLUCOSE 131 (H) 04/07/2021 0500    BUN 22 04/07/2021 0500   CREATININE 1.13 (H) 04/07/2021 0500   CALCIUM 9.2 04/07/2021 0500   GFRNONAA 54 (L) 04/07/2021 0500   GFRAA >60 03/23/2018 0622     Intake/Output Summary (Last 24 hours) at 04/07/2021 2376 Last data filed at 04/07/2021 2831 Gross per 24 hour  Intake 1250 ml  Output 1550 ml  Net -300 ml    HOSPITAL MEDICATIONS Scheduled Meds:   stroke: mapping our early stages of recovery book   Does not apply Once   aspirin EC  81 mg Oral Daily   atorvastatin  80 mg Oral Daily   clopidogrel  75 mg Oral Daily   docusate sodium  100 mg Oral Daily   heparin  5,000 Units Subcutaneous Q8H   insulin aspart  0-9 Units Subcutaneous Q4H   pantoprazole  40 mg Oral Daily   Continuous Infusions:  sodium chloride     sodium chloride Stopped (04/07/21 0600)   sodium chloride     magnesium sulfate bolus IVPB     PRN Meds:.sodium chloride, acetaminophen **OR** acetaminophen (TYLENOL) oral liquid 160 mg/5 mL **OR** acetaminophen, alum & mag hydroxide-simeth, guaiFENesin-dextromethorphan, hydrALAZINE, labetalol, magnesium sulfate bolus IVPB, metoprolol tartrate, morphine injection, ondansetron, oxyCODONE-acetaminophen, phenol, potassium chloride, senna-docusate  Assessment and Plan: POD 1 left TCAR with worsening right sided weakness immediately post-op with MRI evidence of expansion of previous left frontoparietal infarct. CTA neck shows patent carotid stent. Neuro on-board. BP in goal range.  Continue asa, statin, Plavix -DVT prophylaxis:  heparin Dennis  Wendi Maya, PA-C [facility] [phone] 04/07/2021  7:22 AM   I have seen and evaluated the patient. I agree with the PA note as documented above.  Postop day 1 status post left TCAR for symptomatic high-grade stenosis.  Awoke with more weakness in the right leg and MRI does show evolving strokes with some new small strokes on the left side likely thromboembolic from procedure.  She does feel like she has got  some strength back in the right leg today.  Still unable to lift off the bed by my assessment and 2/5.  Stent was patent on imaging yesterday.  Continue aspirin statin Plavix.  Appreciate neurology input.  Cephus Shelling, MD [facility] [place] Office: [phone]

## 2021-04-08 LAB — GLUCOSE, CAPILLARY
Glucose-Capillary: 106 mg/dL — ABNORMAL HIGH (ref 70–99)
Glucose-Capillary: 124 mg/dL — ABNORMAL HIGH (ref 70–99)
Glucose-Capillary: 129 mg/dL — ABNORMAL HIGH (ref 70–99)
Glucose-Capillary: 130 mg/dL — ABNORMAL HIGH (ref 70–99)
Glucose-Capillary: 132 mg/dL — ABNORMAL HIGH (ref 70–99)
Glucose-Capillary: 194 mg/dL — ABNORMAL HIGH (ref 70–99)

## 2021-04-08 MED ORDER — INSULIN ASPART 100 UNIT/ML IJ SOLN
0.0000 [IU] | Freq: Three times a day (TID) | INTRAMUSCULAR | Status: DC
  Administered 2021-04-08: 2 [IU] via SUBCUTANEOUS
  Administered 2021-04-08 – 2021-04-09 (×3): 1 [IU] via SUBCUTANEOUS
  Administered 2021-04-09: 2 [IU] via SUBCUTANEOUS
  Administered 2021-04-09 (×2): 1 [IU] via SUBCUTANEOUS
  Administered 2021-04-10: 2 [IU] via SUBCUTANEOUS
  Administered 2021-04-10: 3 [IU] via SUBCUTANEOUS
  Administered 2021-04-10: 1 [IU] via SUBCUTANEOUS
  Administered 2021-04-10: 3 [IU] via SUBCUTANEOUS
  Administered 2021-04-11 (×3): 1 [IU] via SUBCUTANEOUS
  Administered 2021-04-11: 3 [IU] via SUBCUTANEOUS
  Administered 2021-04-12: 2 [IU] via SUBCUTANEOUS
  Administered 2021-04-12: 3 [IU] via SUBCUTANEOUS
  Administered 2021-04-12 – 2021-04-13 (×4): 2 [IU] via SUBCUTANEOUS

## 2021-04-08 MED ORDER — GABAPENTIN 100 MG PO CAPS
100.0000 mg | ORAL_CAPSULE | Freq: Every day | ORAL | Status: DC
  Administered 2021-04-09: 100 mg via ORAL
  Filled 2021-04-08 (×2): qty 1

## 2021-04-08 NOTE — Progress Notes (Signed)
pt refused her Gabapentin and stated that the last time she took Gabapentin she black out on the [address]. She prefers "natural things".

## 2021-04-08 NOTE — Plan of Care (Signed)
Stroke POC initiated and improving.

## 2021-04-08 NOTE — Progress Notes (Addendum)
   VASCULAR SURGERY ASSESSMENT & PLAN:   Postop day 1 status post left TCAR for symptomatic high-grade stenosis. Worsening right sided weakness post-op. Neuro consulted and brain imaging obtained. Rec: Plavix and aspirin 325 mg for 3 months then Plavix alone.  Follow-up with [facility] in 3-4 weeks with carotid duplex. Rec: continue high intensity statin therapy.  DVT prophy: heparin Briar  SUBJECTIVE:   Complaining of BLE "spasms". Chronic in nature she says is relieved with magnesium supplement. Tolerating diet.  PHYSICAL EXAM:   Vitals:   04/07/21 2100 04/07/21 2113 04/08/21 0001 04/08/21 0400  BP: (!) 145/73 (!) 145/73 (!) 177/71 (!) 143/52  Pulse: 90 60 76 75  Resp: (!) 28 17 20 20   Temp: 98.8 F (37.1 C) 98.8 F (37.1 C) 98.5 F (36.9 C) 98.8 F (37.1 C)  TempSrc: Oral  Oral Oral  SpO2: 98%  98% 96%  Weight:      Height:       General appearance: Awake, alert in no apparent distress Neurologic: Alert and oriented x4, face symmetric. Right UE flaccid.  Able to lift right leg about 2 inches from bed. Cardiac: Heart rate and rhythm are regular Respirations: Nonlabored Incision: Well approximated with adherent blood clot under Ster-strips.  Subcutaneous tissue soft to palpation Right groin venipuncture site: Soft without hematoma or bleeding   LABS:   Lab Results  Component Value Date   WBC 11.5 (H) 04/07/2021   HGB 12.3 04/07/2021   HCT 36.7 04/07/2021   MCV 95.1 04/07/2021   PLT 312 04/07/2021   Lab Results  Component Value Date   CREATININE 1.13 (H) 04/07/2021   Lab Results  Component Value Date   INR 1.0 04/01/2021   CBG (last 3)  Recent Labs    04/07/21 2359 04/08/21 0445 04/08/21 0755  GLUCAP 106* 124* 130*    PROBLEM LIST:    Principal Problem:   Acute CVA (cerebrovascular accident) (HCC) Active Problems:   Diabetes mellitus type 2 with neurological manifestations (HCC)   Pure hypercholesterolemia   Essential hypertension, benign   Obesity, Class  III, BMI 40-49.9 (morbid obesity) (HCC)   TIA (transient ischemic attack)   Carotid artery stenosis with cerebral infarction (HCC)   COPD (chronic obstructive pulmonary disease) (HCC)   CURRENT MEDS:     stroke: mapping our early stages of recovery book   Does not apply Once   amLODipine  10 mg Oral Daily   aspirin EC  325 mg Oral Daily   atorvastatin  80 mg Oral Daily   clopidogrel  75 mg Oral Daily   docusate sodium  100 mg Oral Daily   heparin  5,000 Units Subcutaneous Q8H   hydrochlorothiazide  12.5 mg Oral Daily   insulin aspart  0-9 Units Subcutaneous Q4H   pantoprazole  40 mg Oral Daily    04/10/21, PA-C  Office: [phone] 04/08/2021   I agree with the above.  Have seen and evaluated patient.  She feels that her right leg is moving better but her right arm remains flaccid.  Her biggest complaint is leg cramps.  She has palpable pedal pulses she takes magnesium at home and would like to take her home medications.  I discussed with her that it would be in her best interest to go to rehab.  She is contemplating this. 04/10/2021

## 2021-04-08 NOTE — Progress Notes (Signed)
PROGRESS NOTE  Jade [name] IHK:[id number] DOB: July 07, 1956 DOA: 04/01/2021 PCP: System, Provider Not In   LOS: 6 days   Brief narrative:  Jade [name] is a 65 y.o. female with a history of recent CVA, COPD, CKD, hyperlipidemia, hypertension, obesity, diabetes mellitus type 2 presented to hospital with numbness and weakness of the right hand and was noted to have acute left posterior MCA branch CVA likely secondary to severe left ICA stenosis.  Vascular surgery was consulted and patient underwent TCAR on 04/06/2021.  Assessment/Plan:  Principal Problem:   Acute CVA (cerebrovascular accident) (HCC) Active Problems:   Diabetes mellitus type 2 with neurological manifestations (HCC)   Pure hypercholesterolemia   Essential hypertension, benign   Obesity, Class III, BMI 40-49.9 (morbid obesity) (HCC)   TIA (transient ischemic attack)   Carotid artery stenosis with cerebral infarction (HCC)   COPD (chronic obstructive pulmonary disease) (HCC)   Acute CVA History of recent CVA.  Patient did have left ICA stenosis more than 80%.  Neurology and vascular surgery on board.  On aspirin/Plavix, statins.  Status post TCAR on 04/06/2021 with postoperative increased weakness.  MRI postoperatively showed increasing lateral left frontoparietal infarct.  We will continue to follow neurology and vascular surgery recommendations.  Neurology on board.  Neurology recommended aspirin and Plavix for 3 months then Plavix alone.  Currently on aspirin 325 mg.  Continue high intensity statins.   Left ICA stenosis S/p  TCAR on 04/06/2021.  Further management as per vascular surgery.   Hyperlipidemia Continue statins.  Essential hypertension Currently on as needed antihypertensive.  On amlodipine and hydrochlorothiazide as an outpatient.   On as needed hydralazine and labetalol.  Goal is to keep blood pressure between 130-160 as per neurology.   Diabetes mellitus, type II with diabetic retinopathy Diet  controlled at home.  Hemoglobin A1c was 6.5.  Patient follows with ophthalmology as an outpatient for diabetic retinopathy.  Continue diabetic diet, sliding scale insulin.  Latest physical visit 128   COPD As needed albuterol.  Compensated.   Obesity Body mass index is 32.12 kg/m.  Would benefit from weight loss as outpatient  DVT prophylaxis: heparin injection 5,000 Units Start: 04/07/21 0600 SCD's Start: 04/02/21 0208   Code Status: DNR  Family Communication: None  Status is: Inpatient  Remains inpatient appropriate because: IV treatments appropriate due to intensity of illness or inability to take PO, Inpatient level of care appropriate due to severity of illness  Dispo: The patient is from: Home              Anticipated d/c is to: SNF /CIR               Patient currently is not medically stable to d/c.   Difficult to place patient No  Consultants: Neurology Vascular surgery  Procedures: TCAR on 04/06/2021  Anti-infectives:  None  Anti-infectives (From admission, onward)    Start     Dose/Rate Route Frequency Ordered Stop   04/06/21 1845  ceFAZolin (ANCEF) IVPB 2g/100 mL premix        2 g 200 mL/hr over 30 Minutes Intravenous Every 8 hours 04/06/21 1758 04/07/21 0602   04/06/21 0000  ceFAZolin (ANCEF) IVPB 1 g/50 mL premix       Note to Pharmacy: Send with pt to OR   1 g 100 mL/hr over 30 Minutes Intravenous On call 04/05/21 0956 04/06/21 1215       Subjective: Today, patient was seen and examined at bedside.  Complains  of dense right upper extremity weakness.  Denies headache, nausea vomiting.  Objective: Vitals:   04/08/21 1200 04/08/21 1208  BP: (!) 154/85   Pulse: (!) 56 79  Resp: 16 15  Temp: 98.7 F (37.1 C)   SpO2: 96% 99%    Intake/Output Summary (Last 24 hours) at 04/08/2021 1439 Last data filed at 04/08/2021 1230 Gross per 24 hour  Intake 240 ml  Output 1900 ml  Net -1660 ml    Filed Weights   04/01/21 2010 04/04/21 0500 04/06/21  1033  Weight: 77.1 kg 78.1 kg 76.2 kg   Body mass index is 31.74 kg/m.   Physical Exam:  General: Obese built, not in obvious distress HENT:   No scleral pallor or icterus noted. Oral mucosa is moist.  Chest:  Clear breath sounds.  Diminished breath sounds bilaterally. No crackles or wheezes.  CVS: S1 &S2 heard. No murmur.  Regular rate and rhythm. Abdomen: Soft, nontender, nondistended.  Bowel sounds are heard.   Extremities: No cyanosis, clubbing or edema.  Peripheral pulses are palpable. Psych: Alert, awake and oriented, normal mood CNS:  No cranial nerve deficits, right-sided weakness.  Dense weakness on the right upper extremity than right lower extremity.,  Speech distinct Skin: Warm and dry.  No rashes noted.  Data Review: I have personally reviewed the following laboratory data and studies,  CBC: Recent Labs  Lab 04/01/21 2022 04/06/21 0345 04/07/21 0500  WBC 10.5 9.5 11.5*  NEUTROABS 7.8*  --   --   HGB 15.0 14.4 12.3  HCT 43.2 43.4 36.7  MCV 93.7 95.4 95.1  PLT 365 317 312    Basic Metabolic Panel: Recent Labs  Lab 04/01/21 2022 04/02/21 0411 04/03/21 2001 04/06/21 0345 04/07/21 0500  NA 139  --   --  142 138  K 3.3*  --   --  3.5 4.0  CL 106  --   --  107 107  CO2 22  --   --  24 23  GLUCOSE 168*  --   --  130* 131*  BUN 21  --   --  24* 22  CREATININE 0.96  --   --  0.98 1.13*  CALCIUM 10.0  --   --  9.5 9.2  MG  --  2.0 1.9 2.3 2.3  PHOS  --  3.1  --   --  4.9*    Liver Function Tests: Recent Labs  Lab 04/01/21 2022  AST 23  ALT 17  ALKPHOS 75  BILITOT 1.0  PROT 7.1  ALBUMIN 4.0    No results for input(s): LIPASE, AMYLASE in the last 168 hours. No results for input(s): AMMONIA in the last 168 hours. Cardiac Enzymes: No results for input(s): CKTOTAL, CKMB, CKMBINDEX, TROPONINI in the last 168 hours. BNP (last 3 results) No results for input(s): BNP in the last 8760 hours.  ProBNP (last 3 results) No results for input(s): PROBNP in  the last 8760 hours.  CBG: Recent Labs  Lab 04/07/21 2009 04/07/21 2359 04/08/21 0445 04/08/21 0755 04/08/21 1410  GLUCAP 158* 106* 124* 130* 194*    Recent Results (from the past 240 hour(s))  SARS CORONAVIRUS 2 (TAT 6-24 HRS) Nasopharyngeal Nasopharyngeal Swab     Status: None   Collection Time: 04/02/21 12:49 AM   Specimen: Nasopharyngeal Swab  Result Value Ref Range Status   SARS Coronavirus 2 NEGATIVE NEGATIVE Final    Comment: (NOTE) SARS-CoV-2 target nucleic acids are NOT DETECTED.  The SARS-CoV-2 RNA is  generally detectable in upper and lower respiratory specimens during the acute phase of infection. Negative results do not preclude SARS-CoV-2 infection, do not rule out co-infections with other pathogens, and should not be used as the sole basis for treatment or other patient management decisions. Negative results must be combined with clinical observations, patient history, and epidemiological information. The expected result is Negative.  Fact Sheet for Patients: HairSlick.no  Fact Sheet for Healthcare Providers: quierodirigir.com  This test is not yet approved or cleared by the [place] FDA and  has been authorized for detection and/or diagnosis of SARS-CoV-2 by FDA under an Emergency Use Authorization (EUA). This EUA will remain  in effect (meaning this test can be used) for the duration of the COVID-19 declaration under Se ction 564(b)(1) of the Act, 21 U.S.C. section 360bbb-3(b)(1), unless the authorization is terminated or revoked sooner.  Performed at [facility], [address] [place], [place] [address]   Surgical pcr screen     Status: None   Collection Time: 04/06/21 10:01 AM   Specimen: Nasal Mucosa; Nasal Swab  Result Value Ref Range Status   MRSA, PCR NEGATIVE NEGATIVE Final   Staphylococcus aureus NEGATIVE NEGATIVE Final    Comment: (NOTE) The Xpert SA Assay (FDA approved  for NASAL specimens in patients 40 years of age and older), is one component of a comprehensive surveillance program. It is not intended to diagnose infection nor to guide or monitor treatment. Performed at [facility], [address] [place], [place] [address]       Studies: MR BRAIN WO CONTRAST  Result Date: 04/07/2021 CLINICAL DATA:  Follow-up examination for stroke. EXAM: MRI HEAD WITHOUT CONTRAST TECHNIQUE: Multiplanar, multiecho pulse sequences of the brain and surrounding structures were obtained without intravenous contrast. COMPARISON:  CTs from earlier the same day as well as prior MRI from 04/01/2021. FINDINGS: Brain: Examination degraded by motion. Age-related cerebral atrophy with chronic microvascular ischemic disease. Scattered remote lacunar infarcts noted about the basal ganglia and left thalamus. There has been interval enlargement/expansion of the previously identified infarct involving the posterior left frontoparietal region, postcentral gyrus, increased in size as compared to prior. Area of infarction now measures up to approximately 5 cm in size. No associated hemorrhage or significant mass effect. Additionally, there are a few new acute ischemic infarcts involving the anterior left temporal lobe (series 3, image 21), right thalamic capsular region (series 3, image 26) and left parieto-occipital region (series 3, image 30). No associated hemorrhage or mass effect about these new areas of infarction. Note again made of a small apparent focus of diffusion signal abnormality adjacent to the frontal horn of the left lateral ventricle, favored to be artifactual nature due to adjacent susceptibility artifact at this location, stable (series 7, image 72). No mass lesion or significant mass effect. Benign arachnoid cyst at the right middle cranial fossa noted. Ventricular prominence related to underlying global parenchymal volume loss without hydrocephalus. No extra-axial fluid  collection. Pituitary gland within normal limits. Vascular: Major intracranial vascular flow voids are maintained. Skull and upper cervical spine: Craniocervical junction within normal limits. Degenerative spondylosis at C3-4 with resultant mild spinal stenosis. Bone marrow signal intensity normal. No scalp soft tissue abnormality. Sinuses/Orbits: Globes and orbital soft tissues demonstrate no acute finding. Air-fluid level noted within the left sphenoid sinus. Paranasal sinuses are otherwise clear. No mastoid effusion. Other: None. IMPRESSION: 1. Interval enlargement/expansion of the previously identified acute posterior left frontoparietal infarct as compared to recent MRI  from 04/01/2021. No associated hemorrhage or significant mass effect. 2. Few additional new small acute ischemic nonhemorrhagic infarcts involving the anterior left temporal lobe, right thalamocapsular region, and left parieto-occipital region. No mass effect. 3. Otherwise stable MRI with underlying atrophy and chronic microvascular ischemic disease. Electronically Signed   By: Rise Mu M.D.   On: 04/07/2021 02:26   CT HEAD CODE STROKE WO CONTRAST  Result Date: 04/06/2021 CLINICAL DATA:  Code stroke. Postprocedure altered mental status. Acute stroke suspected. EXAM: CT HEAD WITHOUT CONTRAST TECHNIQUE: Contiguous axial images were obtained from the base of the skull through the vertex without intravenous contrast. COMPARISON:  CT and MRI studies 4-5 days ago. FINDINGS: Brain: Brainstem and cerebellum are unremarkable. Cerebral hemispheres show a chronic arachnoid cyst at the anterior middle cranial fossa on the right. There is generalized atrophy with chronic small-vessel ischemic changes of the white matter and old small vessel infarctions of the thalami and basal ganglia. Subacute infarction at the right posterior frontal vertex is now visible as cortical low density. No evidence of swelling or hemorrhage. No new insult is  identified. Vascular: There is atherosclerotic calcification of the major vessels at the base of the brain. Skull: Negative Sinuses/Orbits: Small fluid level in the sphenoid sinus. Other sinuses clear. Orbits negative. Other: None ASPECTS ([place] Stroke Program Early CT Score) - Ganglionic level infarction (caudate, lentiform nuclei, internal capsule, insula, M1-M3 cortex): 7 - Supraganglionic infarction (M4-M6 cortex): 2 Total score (0-10 with 10 being normal): 9 IMPRESSION: 1. Early subacute infarction at the left posterior frontal cortical brain, the same area seen affected on the MRI scan of 5 days ago. Chronic small-vessel ischemic changes elsewhere as described. No evidence of hemorrhage or mass effect. 2. ASPECTS is 9 3. These results were communicated to Dr. Roda Shutters at 4:48 pm on 04/06/2021 by text page via the [facility] messaging system. Electronically Signed   By: Paulina Fusi M.D.   On: 04/06/2021 16:50   CT ANGIO HEAD NECK W WO CM (CODE STROKE)  Result Date: 04/06/2021 CLINICAL DATA:  Stroke follow-up. EXAM: CT ANGIOGRAPHY HEAD AND NECK TECHNIQUE: Multidetector CT imaging of the head and neck was performed using the standard protocol during bolus administration of intravenous contrast. Multiplanar CT image reconstructions and MIPs were obtained to evaluate the vascular anatomy. Carotid stenosis measurements (when applicable) are obtained utilizing NASCET criteria, using the distal internal carotid diameter as the denominator. CONTRAST:  OMNIPAQUE IOHEXOL 350 MG/ML SOLN COMPARISON:  04/02/2021 FINDINGS: CTA NECK FINDINGS Aortic arch: Standard 3 vessel aortic arch. Moderate calcified plaque in the proximal left subclavian artery without flow limiting stenosis. Right carotid system: Patent with unchanged calcified plaque at the carotid bifurcation resulting in less than 50% ICA origin stenosis. No dissection. Left carotid system: A stent has been placed extending from the distal common carotid artery into  the proximal ICA. The stent is patent with 45% narrowing in its midportion. Vertebral arteries: The vertebral arteries are patent and codominant without evidence of a dissection. Atherosclerotic plaque is noted at the right vertebral artery origin without evidence of a significant stenosis. Skeleton: Interbody ankylosis at C3-4. Other neck: Postoperative changes in the left neck including gas, fluid, and small volume hematoma. Upper chest: New band like opacity in the left upper lobe, likely atelectasis. Review of the MIP images confirms the above findings CTA HEAD FINDINGS Anterior circulation: The internal carotid arteries are patent from skull base to carotid termini with mild atherosclerotic plaque bilaterally not resulting in a significant stenosis.  ACAs and MCAs are patent without evidence of a significant A1 or M1 stenosis. There is widespread atherosclerotic branch vessel irregularity including unchanged severe proximal right M2 and left M3 branch stenoses. No aneurysm is identified. Posterior circulation: The intracranial vertebral arteries are patent to the basilar with atherosclerosis resulting in luminal irregularity on the right but no significant stenosis. The basilar artery is widely patent. The PCAs are patent with severe bilateral P2 stenoses again noted. No aneurysm is identified. Venous sinuses: As permitted by contrast timing, patent. Anatomic variants: None. Review of the MIP images confirms the above findings IMPRESSION: 1. No emergent large vessel occlusion. 2. Interval left carotid stenting. The stent is patent with a 45% stenosis in its midportion. 3. Intracranial atherosclerosis include unchanged severe bilateral P2 and bilateral MCA branch vessel stenoses. 4. Aortic Atherosclerosis (ICD10-I70.0). Electronically Signed   By: Sebastian Ache M.D.   On: 04/06/2021 17:26     Joycelyn Das, MD  [facility] 04/08/2021  If 7PM-7AM, please contact night-coverage

## 2021-04-08 NOTE — Evaluation (Signed)
Clinical/Bedside Swallow Evaluation Patient Details  Name: Jade [name] MRN: [id number] Date of Birth: November 20, 1955  Today's Date: 04/08/2021 Time: SLP Start Time (ACUTE ONLY): 0930 SLP Stop Time (ACUTE ONLY): 0945 SLP Time Calculation (min) (ACUTE ONLY): 15 min  Past Medical History:  Past Medical History:  Diagnosis Date   Arthritis    "back, knees, some in my left hip" (03/21/2018)   Chronic kidney disease    "was told I had 25% kidney function in early 2012" (03/21/2018)   COPD (chronic obstructive pulmonary disease) (HCC)    CVA (cerebral vascular accident) (HCC) 03/21/2018   "numb all over; head to toe" (03/21/2018)   Fibromyalgia    History of gout    Hyperlipidemia    Hypertension    Migraine    "used to get them monthly during menopause" (03/21/2018)   Neuromuscular disorder (HCC)    OSA (obstructive sleep apnea)    "don't tolerate the machine" (03/21/2018)   Pneumonia ~ 2007   Type 2 diabetes, diet controlled (HCC)    Past Surgical History:  Past Surgical History:  Procedure Laterality Date   HERNIA REPAIR     LAPAROSCOPIC CHOLECYSTECTOMY  06/2007   "no UHR w/this" (03/21/2018)   TONSILLECTOMY     TRANSCAROTID ARTERY REVASCULARIZATION  Left 04/06/2021   Procedure: LEFT TRANSCAROTID ARTERY REVASCULARIZATION;  Surgeon: Leonie Douglas, MD;  Location: [facility] OR;  Service: Vascular;  Laterality: Left;   ULTRASOUND GUIDANCE FOR VASCULAR ACCESS Right 04/06/2021   Procedure: ULTRASOUND GUIDANCE FOR VASCULAR ACCESS;  Surgeon: Leonie Douglas, MD;  Location: [facility] OR;  Service: Vascular;  Laterality: Right;   UMBILICAL HERNIA REPAIR  2009   HPI:  Jade [name] is a 65 y.o. female with medical history significant of CVA, COPD, CKD, HLD, HTN, OSA, GERD DM2.  Presented with right hand weakness.  She tried to get off the toilet by grabbing onto the grab bar but could not secondary to weakness of her right hand.  She had recent stroke a few weeks ago that affected her vision.  She  did have some mild residual right hand deficits as well that are currently much worse.  No additional visual changes.  No new head MRI or CT but she did have a CT angiography.  It was showing the following:  Severe left ICA origin stenosis due to calcified plaque, over 80%.  2. 40% narrowing at the right ICA origin.  3. Intracranial atherosclerosis most notably causing advanced bilateral PCA stenosis.  4. High-grade high-grade left M3 branch stenosis which could be related to atherosclerosis or recent embolic disease.  SLE on 8/7 was not recommending any f/u ST.   Assessment / Plan / Recommendation Clinical Impression  Patient presents with an oropharyngeal swallow that appears to be [facility]. Swallow initiation appeared timely, voice remained clear and strong throughout. Patient did exhibit intermittent mild throat clearing, however this is likely due to her reports of her management, expectoration of phlegm. Patient and RN denying any swallowing difficulties previous date during meal. SLP is recommending patient continue with current diet and no f/u from ST services at this time. SLP Visit Diagnosis: Dysphagia, unspecified (R13.10)    Aspiration Risk  No limitations;Mild aspiration risk    Diet Recommendation Regular;Thin liquid   Liquid Administration via: Cup;Straw Medication Administration: Whole meds with liquid Supervision: Patient able to self feed Compensations: Slow rate;Small sips/bites Postural Changes: Seated upright at 90 degrees    Other  Recommendations Oral Care Recommendations: Oral care BID;Patient independent  with oral care   Follow up Recommendations None      Frequency and Duration   N/A         Prognosis   N/A     Swallow Study   General Date of Onset: 04/08/21 HPI: Jade [name] is a 65 y.o. female with medical history significant of CVA, COPD, CKD, HLD, HTN, OSA, GERD DM2.  Presented with right hand weakness.  She tried to get off the toilet by grabbing onto  the grab bar but could not secondary to weakness of her right hand.  She had recent stroke a few weeks ago that affected her vision.  She did have some mild residual right hand deficits as well that are currently much worse.  No additional visual changes.  No new head MRI or CT but she did have a CT angiography.  It was showing the following:  Severe left ICA origin stenosis due to calcified plaque, over 80%.  2. 40% narrowing at the right ICA origin.  3. Intracranial atherosclerosis most notably causing advanced bilateral PCA stenosis.  4. High-grade high-grade left M3 branch stenosis which could be related to atherosclerosis or recent embolic disease.  SLE on 8/7 was not recommending any f/u ST. Type of Study: Bedside Swallow Evaluation Previous Swallow Assessment: none found Diet Prior to this Study: Regular;Thin liquids Temperature Spikes Noted: No Respiratory Status: Room air History of Recent Intubation: No Behavior/Cognition: Alert;Cooperative;Pleasant mood Oral Cavity Assessment: Within Functional Limits Oral Care Completed by SLP: No Oral Cavity - Dentition: Missing dentition Vision: Functional for self-feeding Self-Feeding Abilities: Able to feed self Patient Positioning: Upright in bed Baseline Vocal Quality: Normal Volitional Cough: Strong Volitional Swallow: Able to elicit    Oral/Motor/Sensory Function Overall Oral Motor/Sensory Function: Within functional limits   Ice Chips     Thin Liquid Thin Liquid: Within functional limits Presentation: Straw;Self Fed    Nectar Thick     Honey Thick     Puree Puree: Within functional limits   Solid     Solid: Not tested     Angela Nevin, MA, CCC-SLP Speech Therapy [facility]

## 2021-04-09 LAB — CBC
HCT: 37.6 % (ref 36.0–46.0)
Hemoglobin: 12.8 g/dL (ref 12.0–15.0)
MCH: 32.4 pg (ref 26.0–34.0)
MCHC: 34 g/dL (ref 30.0–36.0)
MCV: 95.2 fL (ref 80.0–100.0)
Platelets: 289 10*3/uL (ref 150–400)
RBC: 3.95 MIL/uL (ref 3.87–5.11)
RDW: 13.1 % (ref 11.5–15.5)
WBC: 11.3 10*3/uL — ABNORMAL HIGH (ref 4.0–10.5)
nRBC: 0 % (ref 0.0–0.2)

## 2021-04-09 LAB — MAGNESIUM: Magnesium: 2.2 mg/dL (ref 1.7–2.4)

## 2021-04-09 LAB — GLUCOSE, CAPILLARY
Glucose-Capillary: 126 mg/dL — ABNORMAL HIGH (ref 70–99)
Glucose-Capillary: 109 mg/dL — ABNORMAL HIGH (ref 70–99)
Glucose-Capillary: 127 mg/dL — ABNORMAL HIGH (ref 70–99)
Glucose-Capillary: 150 mg/dL — ABNORMAL HIGH (ref 70–99)
Glucose-Capillary: 179 mg/dL — ABNORMAL HIGH (ref 70–99)

## 2021-04-09 LAB — BASIC METABOLIC PANEL
Anion gap: 11 (ref 5–15)
BUN: 20 mg/dL (ref 8–23)
CO2: 25 mmol/L (ref 22–32)
Calcium: 9.5 mg/dL (ref 8.9–10.3)
Chloride: 104 mmol/L (ref 98–111)
Creatinine, Ser: 1.2 mg/dL — ABNORMAL HIGH (ref 0.44–1.00)
GFR, Estimated: 50 mL/min — ABNORMAL LOW (ref >60)
Glucose, Bld: 130 mg/dL — ABNORMAL HIGH (ref 70–99)
Potassium: 3.2 mmol/L — ABNORMAL LOW (ref 3.5–5.1)
Sodium: 140 mmol/L (ref 135–145)

## 2021-04-09 MED ORDER — POTASSIUM CHLORIDE CRYS ER 20 MEQ PO TBCR
40.0000 meq | EXTENDED_RELEASE_TABLET | Freq: Once | ORAL | Status: AC
  Administered 2021-04-09: 40 meq via ORAL
  Filled 2021-04-09: qty 2

## 2021-04-09 NOTE — Progress Notes (Signed)
    Subjective  - POD #3, s/p left TCAR  No acute events   Physical Exam:  No significant change in neuro exam, still with flacid  R UE       Assessment/Plan:  POD #3  Continue current care  Wells Fareeha Evon 04/09/2021 11:01 AM --  Vitals:   04/09/21 0402 04/09/21 0825  BP: 138/72 (!) 169/57  Pulse: 60 60  Resp: 17 17  Temp: 98.4 F (36.9 C) 98.2 F (36.8 C)  SpO2: 98% 97%    Intake/Output Summary (Last 24 hours) at 04/09/2021 1101 Last data filed at 04/09/2021 0600 Gross per 24 hour  Intake 129.7 ml  Output 900 ml  Net -770.3 ml     Laboratory CBC    Component Value Date/Time   WBC 11.3 (H) 04/09/2021 0014   HGB 12.8 04/09/2021 0014   HCT 37.6 04/09/2021 0014   PLT 289 04/09/2021 0014    BMET    Component Value Date/Time   NA 140 04/09/2021 0014   K 3.2 (L) 04/09/2021 0014   CL 104 04/09/2021 0014   CO2 25 04/09/2021 0014   GLUCOSE 130 (H) 04/09/2021 0014   BUN 20 04/09/2021 0014   CREATININE 1.20 (H) 04/09/2021 0014   CALCIUM 9.5 04/09/2021 0014   GFRNONAA 50 (L) 04/09/2021 0014   GFRAA >60 03/23/2018 0622    COAG Lab Results  Component Value Date   INR 1.0 04/01/2021   INR 0.90 03/21/2018   No results found for: PTT  Antibiotics Anti-infectives (From admission, onward)    Start     Dose/Rate Route Frequency Ordered Stop   04/06/21 1845  ceFAZolin (ANCEF) IVPB 2g/100 mL premix        2 g 200 mL/hr over 30 Minutes Intravenous Every 8 hours 04/06/21 1758 04/07/21 0602   04/06/21 0000  ceFAZolin (ANCEF) IVPB 1 g/50 mL premix       Note to Pharmacy: Send with pt to OR   1 g 100 mL/hr over 30 Minutes Intravenous On call 04/05/21 0956 04/06/21 1215        V. Charlena Cross, M.D., [facility] [place] Office: [phone] Pager:  [phone]

## 2021-04-09 NOTE — Progress Notes (Signed)
PROGRESS NOTE  Jade [name] WLS:[id number] DOB: 06-28-1956 DOA: 04/01/2021 PCP: System, Provider Not In   LOS: 7 days   Brief narrative:  Jade [name] is a 65 y.o. female with a history of recent CVA, COPD, CKD, hyperlipidemia, hypertension, obesity, diabetes mellitus type 2 presented to hospital with numbness and weakness of the right hand and was noted to have acute left posterior MCA branch CVA likely secondary to severe left ICA stenosis.  Vascular surgery was consulted and patient underwent TCAR on 04/06/2021.  Assessment/Plan:  Principal Problem:   Acute CVA (cerebrovascular accident) (HCC) Active Problems:   Diabetes mellitus type 2 with neurological manifestations (HCC)   Pure hypercholesterolemia   Essential hypertension, benign   Obesity, Class III, BMI 40-49.9 (morbid obesity) (HCC)   TIA (transient ischemic attack)   Carotid artery stenosis with cerebral infarction (HCC)   COPD (chronic obstructive pulmonary disease) (HCC)   Acute CVA History of recent CVA.  Patient did have left ICA stenosis more than 80%.  Neurology and vascular surgery on board.  On aspirin/Plavix, statins.  Status post TCAR on 04/06/2021 with postoperative increased weakness.  MRI postoperatively showed increasing lateral left frontoparietal infarct.  We will continue to follow neurology and vascular surgery recommendations.  Neurology on board.  Neurology recommended aspirin and Plavix for 3 months then Plavix alone.  Currently on aspirin 325 mg.  Continue high intensity statins.   Left ICA stenosis S/p  TCAR on 04/06/2021.  Further management as per vascular surgery.   Hyperlipidemia Continue statins.  Essential hypertension Currently on as needed antihypertensive.  On amlodipine and hydrochlorothiazide as an outpatient.   On as needed hydralazine and labetalol.  Goal is to keep blood pressure between  SBP 130-160 as per neurology.   Diabetes mellitus, type II with diabetic retinopathy Diet  controlled at home.  Hemoglobin A1c was 6.5.  Patient follows with ophthalmology as an outpatient for diabetic retinopathy.  Continue diabetic diet, sliding scale insulin.  Latest physical visit 128   COPD As needed albuterol.  Compensated.   Obesity Body mass index is 32.12 kg/m.  Would benefit from weight loss as outpatient  DVT prophylaxis: heparin injection 5,000 Units Start: 04/07/21 0600 SCD's Start: 04/02/21 0208   Code Status: DNR  Family Communication: None  Status is: Inpatient  Remains inpatient appropriate because: IV treatments appropriate due to intensity of illness or inability to take PO, Inpatient level of care appropriate due to severity of illness  Dispo: The patient is from: Home              Anticipated d/c is to: SNF /CIR               Patient currently is not medically stable to d/c.   Difficult to place patient No  Consultants: Neurology Vascular surgery  Procedures: TCAR on 04/06/2021  Anti-infectives:  None  Anti-infectives (From admission, onward)    Start     Dose/Rate Route Frequency Ordered Stop   04/06/21 1845  ceFAZolin (ANCEF) IVPB 2g/100 mL premix        2 g 200 mL/hr over 30 Minutes Intravenous Every 8 hours 04/06/21 1758 04/07/21 0602   04/06/21 0000  ceFAZolin (ANCEF) IVPB 1 g/50 mL premix       Note to Pharmacy: Send with pt to OR   1 g 100 mL/hr over 30 Minutes Intravenous On call 04/05/21 0956 04/06/21 1215       Subjective: Today, feels ok. No interval issues. No headache,  dizziness.   Objective: Vitals:   04/08/21 2333 04/09/21 0402  BP:  138/72  Pulse: 60 60  Resp:  17  Temp: 98.7 F (37.1 C) 98.4 F (36.9 C)  SpO2:  98%    Intake/Output Summary (Last 24 hours) at 04/09/2021 0810 Last data filed at 04/09/2021 0600 Gross per 24 hour  Intake 249.7 ml  Output 900 ml  Net -650.3 ml    Filed Weights   04/01/21 2010 04/04/21 0500 04/06/21 1033  Weight: 77.1 kg 78.1 kg 76.2 kg   Body mass index is 31.74  kg/m.   Physical Exam:  General: Obese built, not in obvious distress HENT:   No scleral pallor or icterus noted. Oral mucosa is moist.  Chest:  Clear breath sounds.  Diminished breath sounds bilaterally. No crackles or wheezes.  CVS: S1 &S2 heard. No murmur.  Regular rate and rhythm. Abdomen: Soft, nontender, nondistended.  Bowel sounds are heard.   Extremities: No cyanosis, clubbing or edema.  Peripheral pulses are palpable. Psych: Alert, awake and oriented, normal mood CNS:  No cranial nerve deficits, right-sided weakness.  Dense weakness on the right upper extremity 0/5 than right lower extremity 1/5.,  Speech distinct Skin: Warm and dry.  No rashes noted.  Data Review: I have personally reviewed the following laboratory data and studies,  CBC: Recent Labs  Lab 04/06/21 0345 04/07/21 0500 04/09/21 0014  WBC 9.5 11.5* 11.3*  HGB 14.4 12.3 12.8  HCT 43.4 36.7 37.6  MCV 95.4 95.1 95.2  PLT 317 312 289    Basic Metabolic Panel: Recent Labs  Lab 04/03/21 2001 04/06/21 0345 04/07/21 0500 04/09/21 0014  NA  --  142 138 140  K  --  3.5 4.0 3.2*  CL  --  107 107 104  CO2  --  24 23 25   GLUCOSE  --  130* 131* 130*  BUN  --  24* 22 20  CREATININE  --  0.98 1.13* 1.20*  CALCIUM  --  9.5 9.2 9.5  MG 1.9 2.3 2.3 2.2  PHOS  --   --  4.9*  --     Liver Function Tests: No results for input(s): AST, ALT, ALKPHOS, BILITOT, PROT, ALBUMIN in the last 168 hours.  No results for input(s): LIPASE, AMYLASE in the last 168 hours. No results for input(s): AMMONIA in the last 168 hours. Cardiac Enzymes: No results for input(s): CKTOTAL, CKMB, CKMBINDEX, TROPONINI in the last 168 hours. BNP (last 3 results) No results for input(s): BNP in the last 8760 hours.  ProBNP (last 3 results) No results for input(s): PROBNP in the last 8760 hours.  CBG: Recent Labs  Lab 04/08/21 0755 04/08/21 1410 04/08/21 1728 04/08/21 2143 04/09/21 0617  GLUCAP 130* 194* 129* 132* 126*     Recent Results (from the past 240 hour(s))  SARS CORONAVIRUS 2 (TAT 6-24 HRS) Nasopharyngeal Nasopharyngeal Swab     Status: None   Collection Time: 04/02/21 12:49 AM   Specimen: Nasopharyngeal Swab  Result Value Ref Range Status   SARS Coronavirus 2 NEGATIVE NEGATIVE Final    Comment: (NOTE) SARS-CoV-2 target nucleic acids are NOT DETECTED.  The SARS-CoV-2 RNA is generally detectable in upper and lower respiratory specimens during the acute phase of infection. Negative results do not preclude SARS-CoV-2 infection, do not rule out co-infections with other pathogens, and should not be used as the sole basis for treatment or other patient management decisions. Negative results must be combined with clinical observations, patient history, and  epidemiological information. The expected result is Negative.  Fact Sheet for Patients: HairSlick.no  Fact Sheet for Healthcare Providers: quierodirigir.com  This test is not yet approved or cleared by the [place] FDA and  has been authorized for detection and/or diagnosis of SARS-CoV-2 by FDA under an Emergency Use Authorization (EUA). This EUA will remain  in effect (meaning this test can be used) for the duration of the COVID-19 declaration under Se ction 564(b)(1) of the Act, 21 U.S.C. section 360bbb-3(b)(1), unless the authorization is terminated or revoked sooner.  Performed at [facility], [address] [place], [place] [address]   Surgical pcr screen     Status: None   Collection Time: 04/06/21 10:01 AM   Specimen: Nasal Mucosa; Nasal Swab  Result Value Ref Range Status   MRSA, PCR NEGATIVE NEGATIVE Final   Staphylococcus aureus NEGATIVE NEGATIVE Final    Comment: (NOTE) The Xpert SA Assay (FDA approved for NASAL specimens in patients 43 years of age and older), is one component of a comprehensive surveillance program. It is not intended to diagnose  infection nor to guide or monitor treatment. Performed at [facility], [address] [place], [place] [address]       Studies: No results found.   Joycelyn Das, MD  [facility] 04/09/2021  If 7PM-7AM, please contact night-coverage

## 2021-04-10 DIAGNOSIS — E876 Hypokalemia: Secondary | ICD-10-CM

## 2021-04-10 LAB — GLUCOSE, CAPILLARY
Glucose-Capillary: 140 mg/dL — ABNORMAL HIGH (ref 70–99)
Glucose-Capillary: 136 mg/dL — ABNORMAL HIGH (ref 70–99)
Glucose-Capillary: 213 mg/dL — ABNORMAL HIGH (ref 70–99)
Glucose-Capillary: 212 mg/dL — ABNORMAL HIGH (ref 70–99)
Glucose-Capillary: 178 mg/dL — ABNORMAL HIGH (ref 70–99)

## 2021-04-10 MED ORDER — CYCLOBENZAPRINE HCL 10 MG PO TABS
5.0000 mg | ORAL_TABLET | Freq: Three times a day (TID) | ORAL | Status: DC | PRN
  Administered 2021-04-10 – 2021-04-11 (×5): 5 mg via ORAL
  Filled 2021-04-10 (×5): qty 1

## 2021-04-10 MED ORDER — GABAPENTIN 100 MG PO CAPS
200.0000 mg | ORAL_CAPSULE | Freq: Every day | ORAL | Status: DC
  Administered 2021-04-10 – 2021-04-12 (×3): 200 mg via ORAL
  Filled 2021-04-10 (×3): qty 2

## 2021-04-10 MED ORDER — MAGNESIUM OXIDE -MG SUPPLEMENT 400 (240 MG) MG PO TABS: 400.0000 mg | ORAL_TABLET | Freq: Two times a day (BID) | ORAL | Status: DC

## 2021-04-10 MED ORDER — POTASSIUM CHLORIDE CRYS ER 20 MEQ PO TBCR
40.0000 meq | EXTENDED_RELEASE_TABLET | Freq: Two times a day (BID) | ORAL | Status: AC
  Administered 2021-04-10 – 2021-04-11 (×3): 40 meq via ORAL
  Administered 2021-04-11: 20 meq via ORAL
  Filled 2021-04-10 (×4): qty 2

## 2021-04-10 NOTE — Progress Notes (Addendum)
  Progress Note    04/10/2021 7:57 AM 4 Days Post-Op  Subjective:  complaining of painful muscle spasms in left arm and left thigh   Vitals:   04/09/21 2318 04/10/21 0329  BP: (!) 159/57 (!) 157/76  Pulse: 61 (!) 56  Resp: 14 15  Temp: 98.5 F (36.9 C) 98.7 F (37.1 C)  SpO2: 99% 98%   Physical Exam: Cardiac:  regular Lungs:  non labored Incisions:  left neck incisions is intact, ecchymosis present Extremities:  right upper extremity remains flaccid, right lower extremity decreased motor, sensation intact, motor and sensation intact to left upper and lower extremities Neurologic: alert and oriented, coherent  CBC    Component Value Date/Time   WBC 11.3 (H) 04/09/2021 0014   RBC 3.95 04/09/2021 0014   HGB 12.8 04/09/2021 0014   HCT 37.6 04/09/2021 0014   PLT 289 04/09/2021 0014   MCV 95.2 04/09/2021 0014   MCH 32.4 04/09/2021 0014   MCHC 34.0 04/09/2021 0014   RDW 13.1 04/09/2021 0014   LYMPHSABS 1.7 04/01/2021 2022   MONOABS 0.8 04/01/2021 2022   EOSABS 0.1 04/01/2021 2022   BASOSABS 0.1 04/01/2021 2022    BMET    Component Value Date/Time   NA 140 04/09/2021 0014   K 3.2 (L) 04/09/2021 0014   CL 104 04/09/2021 0014   CO2 25 04/09/2021 0014   GLUCOSE 130 (H) 04/09/2021 0014   BUN 20 04/09/2021 0014   CREATININE 1.20 (H) 04/09/2021 0014   CALCIUM 9.5 04/09/2021 0014   GFRNONAA 50 (L) 04/09/2021 0014   GFRAA >60 03/23/2018 0622    INR    Component Value Date/Time   INR 1.0 04/01/2021 2022     Intake/Output Summary (Last 24 hours) at 04/10/2021 0757 Last data filed at 04/09/2021 1930 Gross per 24 hour  Intake 360 ml  Output --  Net 360 ml     Assessment/Plan:  65 y.o. female is s/p left TCAR 4 Days Post-Op   TCAR incision is clean, dry and intact No change in neuro, RUE remains flaccid Flexeril ordered to start today to help with muscle spasms Continue Pt/ OT Pending SNF vs CIR Medical management per primary team Follow up has been  arranged with our office in 3-4 weeks   Graceann Congress, PA-C [facility] [phone] 04/10/2021 7:57 AM  VASCULAR STAFF ADDENDUM: I have independently interviewed and examined the patient. I agree with the above.  Continue ASA / Plavix / Statin. Disposition planning. Patient would be best served by [facility] / inpatient rehab.  Rande Brunt. Lenell Antu, MD [facility] Phone Number: [phone] 04/10/2021 1:41 PM

## 2021-04-10 NOTE — Plan of Care (Signed)
  Problem: Education: Goal: Knowledge of disease or condition will improve Outcome: Progressing   

## 2021-04-10 NOTE — Progress Notes (Signed)
Physical Therapy Treatment Patient Details Name: Jade [name] MRN: [id number] DOB: [birth year] Today's Date: 04/10/2021    History of Present Illness Pt is a 65 y.o. female who presented 04/01/21 with R-sided weakness. MRI reveals an embolic appearing stroke on the left, affecting a posterior MCA branch. Imaging revealed severe L ICA origin stenosis. Of note, a few weeks ago pt had stroke affecting her vision and R-hand strength, MRI then revealed acute L thalamic , anterior left frontal and possible right frontal and multiple remote lacunar infarcts. 8/11 pt s/p Left transcarotid artery revascularization (TCAR) with worsening right sided weakness immediately post-op with MRI evidence of expansion of previous left frontoparietal infarct. PMH: arthritis, CKD, COPD, CVA, fibromyalgia, gout, HTN, migraine, OSA, DM2    PT Comments    Pt received in supine, pleasantly agreeable to therapy session and with good participation and tolerance for bed mobility and transfer training. Pt with seated/standing balance deficits and tending to posterolateral lean to R. Pt with occasional ability with extend R knee/dorsiflex R ankle but quick to fatigue and needs PROM to mobilize/support RUE. Pt continues to c/o diplopia in L eye. Emphasis on RUE/RLE exercises as able, safety with transfers/pressure relief/repositioning strategies and continued progression of mobility. Pt continues to benefit from PT services to progress toward functional mobility goals.   Follow Up Recommendations  CIR;Supervision/Assistance - 24 hour     Equipment Recommendations  Wheelchair (measurements PT);Wheelchair cushion (measurements PT)    Recommendations for Other Services       Precautions / Restrictions Precautions Precautions: Fall Precaution Comments: post CVA right hemiplegia, diplopia Restrictions Weight Bearing Restrictions: No    Mobility  Bed Mobility Overal bed mobility: Needs Assistance Bed Mobility:  Rolling;Sidelying to Sit Rolling: Max assist Sidelying to sit: Max assist;+2 for physical assistance;+2 for safety/equipment       General bed mobility comments: Pt able to assist in moving L LE towards EOB and use of bedrail on L side to roll but requires Max A x 2 for successful bed mobility due to R sided weakness    Transfers Overall transfer level: Needs assistance Equipment used: Ambulation equipment used Transfers: Sit to/from Stand Sit to Stand: Mod assist;+2 safety/equipment;+2 physical assistance;From elevated surface         General transfer comment: assist to bring hips forward and pull up into Stedy with R UE supported. Pt with R lateral lean in standing and while seated in [place] requiring physical assist to maintain upright posture for transfer to chair via Stedy  Ambulation/Gait         General Gait Details: Unable; totalA in Stedy to chair    Modified Rankin (Stroke Patients Only) Modified Rankin (Stroke Patients Only) Pre-Morbid Rankin Score: Moderate disability Modified Rankin: Severe disability     Balance Overall balance assessment: Needs assistance Sitting-balance support: Single extremity supported;Feet supported Sitting balance-Leahy Scale: Poor Sitting balance - Comments: Min A for sitting balance EOB, will lean to R side and posterior - light assist to correct, unable to maintain fair sitting balance > 10 sec Postural control: Right lateral lean;Posterior lean Standing balance support: Bilateral upper extremity supported Standing balance-Leahy Scale: Poor Standing balance comment: requires support of L UE on Stedy and +2 external support due to R lateral lean and RUE flaccidity              Cognition Arousal/Alertness: Awake/alert Behavior During Therapy: WFL for tasks assessed/performed Overall Cognitive Status: Impaired/Different from baseline  General Comments: Some word finding difficulty noted but with increased time  and some assist from staff, able to mostly express her needs to staff. Pt pleasantly cooperative.      Exercises General Exercises - Lower Extremity Ankle Circles/Pumps: AAROM;PROM;Right;10 reps;Supine;Seated Long Arc Quad: PROM;AAROM;Right;10 reps;Seated Heel Slides: AAROM;Right;10 reps;Supine    General Comments General comments (skin integrity, edema, etc.): BP 146/101 (115) reclined in chair (reading taken seated 160/133 seated EOB but pt unable to keep LLE still during assessment); RR 26 rpm and SpO2 WNL on RA      Pertinent Vitals/Pain Pain Assessment: Faces Faces Pain Scale: Hurts little more Pain Location: generalized grimacing with mobility, R shoulder, L thigh "running down to my foot", pain in LUE with BP assessment Pain Descriptors / Indicators: Grimacing Pain Intervention(s): Monitored during session;Repositioned;Limited activity within patient's tolerance     PT Goals (current goals can now be found in the care plan section) Acute Rehab PT Goals Patient Stated Goal: to be able to go to rehab and then home PT Goal Formulation: With patient Time For Goal Achievement: 04/21/21 Progress towards PT goals: Progressing toward goals    Frequency    Min 4X/week      PT Plan Current plan remains appropriate    Co-evaluation PT/OT/SLP Co-Evaluation/Treatment: Yes Reason for Co-Treatment: Complexity of the patient's impairments (multi-system involvement);To address functional/ADL transfers PT goals addressed during session: Mobility/safety with mobility;Proper use of DME;Balance;Strengthening/ROM OT goals addressed during session: ADL's and self-care;Strengthening/ROM      AM-PAC PT "6 Clicks" Mobility   Outcome Measure  Help needed turning from your back to your side while in a flat bed without using bedrails?: A Lot Help needed moving from lying on your back to sitting on the side of a flat bed without using bedrails?: Total Help needed moving to and from a bed  to a chair (including a wheelchair)?: Total Help needed standing up from a chair using your arms (e.g., wheelchair or bedside chair)?: A Lot Help needed to walk in hospital room?: Total Help needed climbing 3-5 steps with a railing? : Total 6 Click Score: 8    End of Session Equipment Utilized During Treatment: Gait belt Activity Tolerance: Patient tolerated treatment well Patient left: in chair;with call bell/phone within reach;with chair alarm set Nurse Communication: Mobility status;Need for lift equipment PT Visit Diagnosis: Other abnormalities of gait and mobility (R26.89);Hemiplegia and hemiparesis Hemiplegia - Right/Left: Right Hemiplegia - dominant/non-dominant: Dominant Hemiplegia - caused by: Cerebral infarction     Time: 7846-9629 PT Time Calculation (min) (ACUTE ONLY): 36 min  Charges:  $Therapeutic Activity: 8-22 mins                    Jade Torain P., PTA Acute Rehabilitation Services Pager: [phone] Office: [phone]    Angus [place]ms 04/10/2021, 3:38 PM

## 2021-04-10 NOTE — Progress Notes (Signed)
Inpatient Rehabilitation Admissions Coordinator   I met with patient at bedside in follow up from our conversation on Friday to clarify caregiver supports available after any rehab venue. With her permission I contacted her son, Corene Cornea, by phone. He is to further discuss with his Mom when visiting today their preferences for rehab. He was expecting a 3 to 4 week Cir admit and then to return home . She did not got to SNF last admission for she would not be vaccinated for COVID. I will follow up tomorrow.  Danne Baxter, RN, MSN Rehab Admissions Coordinator [phone] 04/10/2021 12:43 PM

## 2021-04-10 NOTE — Progress Notes (Signed)
Mobility Specialist: Progress Note   04/10/21 1812  Mobility  Activity Transferred:  Chair to bed  Level of Assistance +2 (takes two people)  Assistive Device Stedy  Mobility Out of bed to chair with meals  Mobility Response Tolerated well  Mobility performed by Mobility specialist;Nurse  $Mobility charge 1 Mobility   Pt assisted back to bed with help from RN. Pt required minA to stand from chair at the [facility] as well as min guard during transfer for balance d/t R side weakness. Pt back in bed with call bell and phone at her side. RN present in the room.   [facility] [place] Mobility Specialist Mobility Specialist Phone: [phone]

## 2021-04-10 NOTE — Care Management Important Message (Signed)
Important Message  Patient Details  Name: Jade [name] MRN: [id number] Date of Birth: [birth year]   Medicare Important Message Given:  Yes     Renie Ora 04/10/2021, 9:44 AM

## 2021-04-10 NOTE — Progress Notes (Signed)
Occupational Therapy Treatment Patient Details Name: Jade [name] MRN: [id number] DOB: Jan 17, 1956 Today's Date: 04/10/2021    History of present illness Pt is a 65 y.o. female who presented 04/01/21 with R-sided weakness. MRI reveals an embolic appearing stroke on the left, affecting a posterior MCA branch. Imaging revealed severe L ICA origin stenosis. Of note, a few weeks ago pt had stroke affecting her vision and R-hand strength, MRI then revealed acute L thalamic , anterior left frontal and possible right frontal and multiple remote lacunar infarcts. 8/11 pt s/p Left transcarotid artery revascularization (TCAR) with worsening right sided weakness immediately post-op with MRI evidence of expansion of previous left frontoparietal infarct. PMH: arthritis, CKD, COPD, CVA, fibromyalgia, gout, HTN, migraine, OSA, DM2   OT comments  Pt progressing gradually towards OT goals and remains motivated to participate. Pt continues to require extensive +2 physical assist for bed mobility and transfers to recliner via Stedy due to significant R sided weakness. Pt with continued double vision - further adjusted partial occlusion glasses with wider tape on nasal portion of L lens with pt verbalizing improvements with this modification. Pt positioned with R UE supported and pillows to improve upright posture in chair. Continue to recommend CIR due to significant impairments, rehab potential and pt motivation.   Follow Up Recommendations  CIR;Supervision/Assistance - 24 hour    Equipment Recommendations  Other (comment) (TBD pending progress)    Recommendations for Other Services Rehab consult    Precautions / Restrictions Precautions Precautions: Fall Precaution Comments: post CVA right hemiplegia Restrictions Weight Bearing Restrictions: No       Mobility Bed Mobility Overal bed mobility: Needs Assistance Bed Mobility: Rolling;Sidelying to Sit Rolling: Max assist;+2 for safety/equipment;+2 for  physical assistance Sidelying to sit: Max assist;+2 for physical assistance;+2 for safety/equipment       General bed mobility comments: Pt able to assist in moving L LE towards EOB and use of bedrail on L side to roll but requires Max A x 2 for successful bed mobility due to R sided weakness    Transfers Overall transfer level: Needs assistance Equipment used: Ambulation equipment used Transfers: Sit to/from Stand Sit to Stand: Mod assist;+2 safety/equipment;+2 physical assistance;From elevated surface         General transfer comment: assist to bring hips forward and pull up into Stedy with R UE supported. Pt with R lateral lean in standing in [place] requiring physical assist to maintain upright posture for transfer to chair via Stedy    Balance Overall balance assessment: Needs assistance Sitting-balance support: Single extremity supported;Feet supported Sitting balance-Leahy Scale: Poor Sitting balance - Comments: Min A for sitting balance EOB, will lean to R side and posterior - light assist to correct, unable to maintain fair sitting balance > 10 sec Postural control: Right lateral lean;Posterior lean Standing balance support: Bilateral upper extremity supported Standing balance-Leahy Scale: Poor Standing balance comment: requires support of L UE on Stedy and +2 external support due to R lateral lean                           ADL either performed or assessed with clinical judgement   ADL Overall ADL's : Needs assistance/impaired Eating/Feeding: Set up;Sitting Eating/Feeding Details (indicate cue type and reason): able to pour water into cup, some difficulties with double vision                 Lower Body Dressing: Maximal assistance;Bed level  General ADL Comments: Session focused on progression of OOB activity, also modified partial occlusion glasses towards nasal portion as pt reports son attempted to assist fixing glasses but was  unsure if he did it correctly. Requires increased occlusion with second piece of tape     Vision   Vision Assessment?: Yes;Vision impaired- to be further tested in functional context Eye Alignment: Within Functional Limits Ocular Range of Motion: Restricted looking up;Restricted looking down Alignment/Gaze Preference: Within Defined Limits Tracking/Visual Pursuits: Other (comment) Visual Fields: No apparent deficits Diplopia Assessment: Disappears with one eye closed;Objects split side to side;Present in far gaze;Only with left gaze Additional Comments: Double vision impairing functional abiltiies. Adjusted tape on glasses to nasal side with improvements noted though still present. Pt required 2nd piece of tape for further occlusion to improve double vision and ability to read text on tv screen   Perception     Praxis      Cognition Arousal/Alertness: Awake/alert Behavior During Therapy: [facility] for tasks assessed/performed Overall Cognitive Status: Within Functional Limits for tasks assessed                                 General Comments: Pt does have trouble with speech and alwasys getting her point across due to this        Exercises     Shoulder Instructions       General Comments BP 150s/100s taken via L LE. Pt with increased pain with BP attempts on L UE and unable to tolerate though no UE restrictions. Denies dizziness with movement    Pertinent Vitals/ Pain       Pain Assessment: Faces Faces Pain Scale: Hurts little more Pain Location: generalized grimacing with mobility, R shoulder Pain Descriptors / Indicators: Grimacing Pain Intervention(s): Monitored during session;Limited activity within patient's tolerance;Repositioned  Home Living                                          Prior Functioning/Environment              Frequency  Min 2X/week        Progress Toward Goals  OT Goals(current goals can now be found in the  care plan section)  Progress towards OT goals: Progressing toward goals  Acute Rehab OT Goals Patient Stated Goal: to be able to go to rehab and then home OT Goal Formulation: With patient Time For Goal Achievement: 04/16/21 Potential to Achieve Goals: Good ADL Goals Pt Will Perform Grooming: with min guard assist;standing Pt Will Perform Upper Body Bathing: with min guard assist;sitting Pt Will Perform Lower Body Bathing: with min guard assist;sit to/from stand Pt Will Perform Upper Body Dressing: with min guard assist;sitting Pt Will Perform Lower Body Dressing: with min guard assist;sit to/from stand Pt Will Transfer to Toilet: with min assist;ambulating;bedside commode Pt Will Perform Toileting - Clothing Manipulation and hygiene: with min assist Pt/caregiver will Perform Home Exercise Program: Increased ROM;Increased strength;Right Upper extremity;With written HEP provided;With minimal assist Additional ADL Goal #1: Pt will be minguard A sit<>stand for basic ADLs Additional ADL Goal #2: Pt will be able to locate items on her right without cues Additional ADL Goal #3: Monitor for need for RUE resting hand splint  Plan Frequency remains appropriate;Discharge plan remains appropriate    Co-evaluation    PT/OT/SLP Co-Evaluation/Treatment: Yes  Reason for Co-Treatment: For patient/therapist safety;To address functional/ADL transfers;Complexity of the patient's impairments (multi-system involvement)   OT goals addressed during session: ADL's and self-care;Strengthening/ROM      AM-PAC OT "6 Clicks" Daily Activity     Outcome Measure   Help from another person eating meals?: A Little Help from another person taking care of personal grooming?: A Lot Help from another person toileting, which includes using toliet, bedpan, or urinal?: Total Help from another person bathing (including washing, rinsing, drying)?: A Lot Help from another person to put on and taking off regular upper  body clothing?: A Lot Help from another person to put on and taking off regular lower body clothing?: A Lot 6 Click Score: 12    End of Session Equipment Utilized During Treatment: Gait belt  OT Visit Diagnosis: Other abnormalities of gait and mobility (R26.89);Muscle weakness (generalized) (M62.81);Low vision, both eyes (H54.2);Hemiplegia and hemiparesis Hemiplegia - Right/Left: Right Hemiplegia - dominant/non-dominant: Dominant Hemiplegia - caused by: Cerebral infarction   Activity Tolerance Patient tolerated treatment well   Patient Left in chair;with call bell/phone within reach;with chair alarm set   Nurse Communication          Time: 0109-3235 OT Time Calculation (min): 36 min  Charges: OT General Charges $OT Visit: 1 Visit OT Treatments $Therapeutic Activity: 8-22 mins  Bradd Canary, OTR/L Acute Rehab Services Office: [phone]    Lorre Munroe 04/10/2021, 3:03 PM

## 2021-04-10 NOTE — Progress Notes (Signed)
PROGRESS NOTE  Jade [name] Jade [name] DOB: [birth year] DOA: 04/01/2021 PCP: System, Provider Not In   LOS: 8 days   Brief narrative:  Jade [name] is a 65 y.o. female with a history of recent CVA, COPD, CKD, hyperlipidemia, hypertension, obesity, diabetes mellitus type 2 presented to hospital with numbness and weakness of the right hand and was noted to have acute left posterior MCA branch CVA likely secondary to severe left ICA stenosis.  Vascular surgery was consulted and patient underwent TCAR on 04/06/2021.   Post operatively, she had increased weakness of the right side.  She has otherwise remained stable except for persistent flaccid weakness.  Currently, she is awaiting for placement.  Assessment/Plan:  Principal Problem:   Acute CVA (cerebrovascular accident) (HCC) Active Problems:   Diabetes mellitus type 2 with neurological manifestations (HCC)   Pure hypercholesterolemia   Essential hypertension, benign   Obesity, Class III, BMI 40-49.9 (morbid obesity) (HCC)   TIA (transient ischemic attack)   Carotid artery stenosis with cerebral infarction (HCC)   COPD (chronic obstructive pulmonary disease) (HCC)   Acute CVA History of recent CVA.  Patient did have left ICA stenosis more than 80%.  Neurology and vascular surgery on board.  On aspirin/Plavix, statins.  Status post TCAR on 04/06/2021 with postoperative increased weakness of the right upper extremity..  MRI postoperatively showed increasing lateral left frontoparietal infarct.  We will continue to follow neurology and vascular surgery recommendations.    Neurology recommended aspirin and Plavix for 3 months then Plavix alone.  Currently on aspirin 325 mg.  Continue high intensity statins.   Left ICA stenosis S/p  TCAR on 04/06/2021.  Further management as per vascular surgery.   Hyperlipidemia Continue statins.  Essential hypertension Currently on as needed antihypertensive.  On amlodipine and  hydrochlorothiazide as an outpatient.   On as needed hydralazine and labetalol.  Goal is to keep blood pressure between  SBP 130-160 as per neurology.   Diabetes mellitus, type II with diabetic retinopathy Diet controlled at home.  Hemoglobin A1c was 6.5.  Patient follows with ophthalmology as an outpatient for diabetic retinopathy.  Continue diabetic diet, sliding scale insulin.  Latest POC glucose of 136.  COPD As needed albuterol.  Compensated.   Obesity Body mass index is 32.12 kg/m.  Would benefit from weight loss as outpatient  Muscle cramps. Increase neurontin to 200 mg at bedtime and add flexeril during today.  Continue magnesium supplement as well.  Patient has a mild hypokalemia which needs to be replenished.    Hypokalemia.  We will continue to replenish.  Check potassium levels in a.m.  DVT prophylaxis: heparin injection 5,000 Units Start: 04/07/21 0600 SCD's Start: 04/02/21 0208   Code Status: DNR  Family Communication:  I tried to reach the patient's spouse on the phone but was unable to reach him today.  Status is: Inpatient  Remains inpatient appropriate because: IV treatments appropriate due to intensity of illness or inability to take PO, Inpatient level of care appropriate due to severity of illness  Dispo: The patient is from: Home              Anticipated d/c is to: SNF /CIR               Patient currently is not medically stable to d/c.   Difficult to place patient No  Consultants: Neurology Vascular surgery  Procedures: TCAR on 04/06/2021  Anti-infectives:  None  Anti-infectives (From admission, onward)    Start  Dose/Rate Route Frequency Ordered Stop   04/06/21 1845  ceFAZolin (ANCEF) IVPB 2g/100 mL premix        2 g 200 mL/hr over 30 Minutes Intravenous Every 8 hours 04/06/21 1758 04/07/21 0602   04/06/21 0000  ceFAZolin (ANCEF) IVPB 1 g/50 mL premix       Note to Pharmacy: Send with pt to OR   1 g 100 mL/hr over 30 Minutes Intravenous  On call 04/05/21 0956 04/06/21 1215       Subjective: Today, complains of muscle cramps on the left leg, unable to sleep well. Had bowel moments otherwise stable.   Objective: Vitals:   04/09/21 2318 04/10/21 0329  BP: (!) 159/57 (!) 157/76  Pulse: 61 (!) 56  Resp: 14 15  Temp: 98.5 F (36.9 C) 98.7 F (37.1 C)  SpO2: 99% 98%    Intake/Output Summary (Last 24 hours) at 04/10/2021 0754 Last data filed at 04/09/2021 1930 Gross per 24 hour  Intake 360 ml  Output --  Net 360 ml    Filed Weights   04/01/21 2010 04/04/21 0500 04/06/21 1033  Weight: 77.1 kg 78.1 kg 76.2 kg   Body mass index is 31.74 kg/m.   Physical Exam:  General: Obese built, not in obvious distress HENT:   No scleral pallor or icterus noted. Oral mucosa is moist.  Left-sided surgical neck scar noted. Chest:  Clear breath sounds.  Diminished breath sounds bilaterally. No crackles or wheezes.  CVS: S1 &S2 heard. No murmur.  Regular rate and rhythm. Abdomen: Soft, nontender, nondistended.  Bowel sounds are heard.   Extremities: No cyanosis, clubbing or edema.  Peripheral pulses are palpable. Psych: Alert, awake and oriented, normal mood CNS: Right upper extremity flaccid weakness, right lower extremity power 1/5. Skin: Warm and dry.  No rashes noted.   CBC: Recent Labs  Lab 04/06/21 0345 04/07/21 0500 04/09/21 0014  WBC 9.5 11.5* 11.3*  HGB 14.4 12.3 12.8  HCT 43.4 36.7 37.6  MCV 95.4 95.1 95.2  PLT 317 312 289    Basic Metabolic Panel: Recent Labs  Lab 04/03/21 2001 04/06/21 0345 04/07/21 0500 04/09/21 0014  NA  --  142 138 140  K  --  3.5 4.0 3.2*  CL  --  107 107 104  CO2  --  24 23 25   GLUCOSE  --  130* 131* 130*  BUN  --  24* 22 20  CREATININE  --  0.98 1.13* 1.20*  CALCIUM  --  9.5 9.2 9.5  MG 1.9 2.3 2.3 2.2  PHOS  --   --  4.9*  --     Liver Function Tests: No results for input(s): AST, ALT, ALKPHOS, BILITOT, PROT, ALBUMIN in the last 168 hours.  No results for  input(s): LIPASE, AMYLASE in the last 168 hours. No results for input(s): AMMONIA in the last 168 hours. Cardiac Enzymes: No results for input(s): CKTOTAL, CKMB, CKMBINDEX, TROPONINI in the last 168 hours. BNP (last 3 results) No results for input(s): BNP in the last 8760 hours.  ProBNP (last 3 results) No results for input(s): PROBNP in the last 8760 hours.  CBG: Recent Labs  Lab 04/09/21 0827 04/09/21 1224 04/09/21 1730 04/09/21 2101 04/10/21 0602  GLUCAP 109* 127* 150* 179* 136*    Recent Results (from the past 240 hour(s))  SARS CORONAVIRUS 2 (TAT 6-24 HRS) Nasopharyngeal Nasopharyngeal Swab     Status: None   Collection Time: 04/02/21 12:49 AM   Specimen: Nasopharyngeal Swab  Result  Value Ref Range Status   SARS Coronavirus 2 NEGATIVE NEGATIVE Final    Comment: (NOTE) SARS-CoV-2 target nucleic acids are NOT DETECTED.  The SARS-CoV-2 RNA is generally detectable in upper and lower respiratory specimens during the acute phase of infection. Negative results do not preclude SARS-CoV-2 infection, do not rule out co-infections with other pathogens, and should not be used as the sole basis for treatment or other patient management decisions. Negative results must be combined with clinical observations, patient history, and epidemiological information. The expected result is Negative.  Fact Sheet for Patients: HairSlick.no  Fact Sheet for Healthcare Providers: quierodirigir.com  This test is not yet approved or cleared by the [place] FDA and  has been authorized for detection and/or diagnosis of SARS-CoV-2 by FDA under an Emergency Use Authorization (EUA). This EUA will remain  in effect (meaning this test can be used) for the duration of the COVID-19 declaration under Se ction 564(b)(1) of the Act, 21 U.S.C. section 360bbb-3(b)(1), unless the authorization is terminated or revoked sooner.  Performed at [facility], [address], [place], [place] [address]   Surgical pcr screen     Status: None   Collection Time: 04/06/21 10:01 AM   Specimen: Nasal Mucosa; Nasal Swab  Result Value Ref Range Status   MRSA, PCR NEGATIVE NEGATIVE Final   Staphylococcus aureus NEGATIVE NEGATIVE Final    Comment: (NOTE) The Xpert SA Assay (FDA approved for NASAL specimens in patients 30 years of age and older), is one component of a comprehensive surveillance program. It is not intended to diagnose infection nor to guide or monitor treatment. Performed at [facility], [address] [place], [place] [address]       Studies: No results found.   Joycelyn Das, MD  [facility] 04/10/2021  If 7PM-7AM, please contact night-coverage

## 2021-04-11 LAB — BASIC METABOLIC PANEL
Anion gap: 7 (ref 5–15)
BUN: 23 mg/dL (ref 8–23)
CO2: 25 mmol/L (ref 22–32)
Calcium: 9.3 mg/dL (ref 8.9–10.3)
Chloride: 106 mmol/L (ref 98–111)
Creatinine, Ser: 1.34 mg/dL — ABNORMAL HIGH (ref 0.44–1.00)
GFR, Estimated: 44 mL/min — ABNORMAL LOW (ref >60)
Glucose, Bld: 149 mg/dL — ABNORMAL HIGH (ref 70–99)
Potassium: 4.4 mmol/L (ref 3.5–5.1)
Sodium: 138 mmol/L (ref 135–145)

## 2021-04-11 LAB — MAGNESIUM: Magnesium: 1.8 mg/dL (ref 1.7–2.4)

## 2021-04-11 LAB — CBC
HCT: 37.5 % (ref 36.0–46.0)
Hemoglobin: 12.9 g/dL (ref 12.0–15.0)
MCH: 32.6 pg (ref 26.0–34.0)
MCHC: 34.4 g/dL (ref 30.0–36.0)
MCV: 94.7 fL (ref 80.0–100.0)
Platelets: 316 10*3/uL (ref 150–400)
RBC: 3.96 MIL/uL (ref 3.87–5.11)
RDW: 13 % (ref 11.5–15.5)
WBC: 11.3 10*3/uL — ABNORMAL HIGH (ref 4.0–10.5)
nRBC: 0 % (ref 0.0–0.2)

## 2021-04-11 LAB — GLUCOSE, CAPILLARY
Glucose-Capillary: 123 mg/dL — ABNORMAL HIGH (ref 70–99)
Glucose-Capillary: 203 mg/dL — ABNORMAL HIGH (ref 70–99)
Glucose-Capillary: 124 mg/dL — ABNORMAL HIGH (ref 70–99)
Glucose-Capillary: 144 mg/dL — ABNORMAL HIGH (ref 70–99)

## 2021-04-11 LAB — PHOSPHORUS: Phosphorus: 3 mg/dL (ref 2.5–4.6)

## 2021-04-11 MED ORDER — POLYETHYLENE GLYCOL 3350 17 G PO PACK
17.0000 g | PACK | Freq: Every day | ORAL | Status: DC
  Administered 2021-04-11 – 2021-04-13 (×3): 17 g via ORAL
  Filled 2021-04-11 (×3): qty 1

## 2021-04-11 MED ORDER — DOCUSATE SODIUM 100 MG PO CAPS
100.0000 mg | ORAL_CAPSULE | Freq: Two times a day (BID) | ORAL | Status: DC
  Administered 2021-04-11 – 2021-04-13 (×4): 100 mg via ORAL
  Filled 2021-04-11 (×4): qty 1

## 2021-04-11 MED ORDER — CYCLOBENZAPRINE HCL 10 MG PO TABS
10.0000 mg | ORAL_TABLET | Freq: Three times a day (TID) | ORAL | Status: DC | PRN
  Administered 2021-04-11: 10 mg via ORAL
  Filled 2021-04-11: qty 1

## 2021-04-11 MED ORDER — MAGNESIUM OXIDE -MG SUPPLEMENT 400 (240 MG) MG PO TABS
400.0000 mg | ORAL_TABLET | Freq: Every day | ORAL | Status: DC
  Administered 2021-04-11 – 2021-04-13 (×3): 400 mg via ORAL
  Filled 2021-04-11 (×3): qty 1

## 2021-04-11 NOTE — Progress Notes (Signed)
  Inpatient Rehabilitation Admissions Coordinator  I spoke with patient at bedside and son, Aurther Loft, by phone. He will be the caregiver for his Mom after a CIR admit. I have insurance approval . I will follow up tomorrow with bed availability this week to plan admit. Son to provide me her Medicaid card tomorrow.  Ottie Glazier, RN, MSN Rehab Admissions Coordinator [phone] 04/11/2021 4:55 PM

## 2021-04-11 NOTE — Progress Notes (Signed)
Inpatient Rehabilitation Admissions Coordinator   I met at bedside with patient . I left a voicemail for her son, Coralyn Mark, to clarify caregiver supports at home.  Danne Baxter, RN, MSN Rehab Admissions Coordinator [phone] 04/11/2021 11:27 AM

## 2021-04-11 NOTE — Progress Notes (Signed)
Physical Therapy Treatment Patient Details Name: Jade [name] MRN: [id number] DOB: [birth year] Today's Date: 04/11/2021    History of Present Illness Pt is a 65 y.o. female who presented 04/01/21 with R-sided weakness. MRI reveals an embolic appearing stroke on the left, affecting a posterior MCA branch. Imaging revealed severe L ICA origin stenosis. Of note, a few weeks ago pt had stroke affecting her vision and R-hand strength, MRI then revealed acute L thalamic , anterior left frontal and possible right frontal and multiple remote lacunar infarcts. 8/11 pt s/p Left transcarotid artery revascularization (TCAR) with worsening right sided weakness immediately post-op with MRI evidence of expansion of previous left frontoparietal infarct. PMH: arthritis, CKD, COPD, CVA, fibromyalgia, gout, HTN, migraine, OSA, DM2.    PT Comments    Pt received in supine, agreeable to therapy session and with good participation for supine/seated exercises. Pt able to perform supine to long sit transfers with L rail and modA trunk support today x multiple reps and with frequent RLE reflexive flexion synergy but pt flexion not correlating with when cued to perform heel slides and pt unable to perform AROM ankle pumps this date on RLE. Emphasized importance of frequent repositioning (q2H) to prevent skin breakdown and pt repositioned with pillows to support RUE/RLE outside of synergistic pattern and decrease pressure on bony prominences. Pt family entering room to visit her at end of session. Pt continues to benefit from PT services to progress toward functional mobility goals.   Follow Up Recommendations  CIR;Supervision/Assistance - 24 hour     Equipment Recommendations  Wheelchair (measurements PT);Wheelchair cushion (measurements PT)    Recommendations for Other Services       Precautions / Restrictions Precautions Precautions: Fall Precaution Comments: post CVA right hemiplegia, diplopia, SBP goal 130-160  per MD note Restrictions Weight Bearing Restrictions: No    Mobility  Bed Mobility Overal bed mobility: Needs Assistance Bed Mobility: Supine to Sit           General bed mobility comments: Pt performed supine to long sit using L side bed rail to pull up and modA trunk support and RUE support from therapist. Able to tolerate sitting up ~30 seconds before needing to lay back and rest. Performed x6 total reps.         General Gait Details: Unable   Modified Rankin (Stroke Patients Only) Modified Rankin (Stroke Patients Only) Pre-Morbid Rankin Score: Moderate disability Modified Rankin: Severe disability     Balance Overall balance assessment: Needs assistance Sitting-balance support: Single extremity supported;Feet supported Sitting balance-Leahy Scale: Poor Sitting balance - Comments: modA for trunk support in long sit ~30 seconds at a time Postural control: Right lateral lean;Posterior lean             Cognition Arousal/Alertness: Awake/alert Behavior During Therapy: WFL for tasks assessed/performed Overall Cognitive Status: Impaired/Different from baseline            General Comments: Some word finding difficulty noted but with increased time and some assist from staff, able to mostly express her needs to staff. Pt pleasantly cooperative but seeming a little frustrated by how fatigued she feels today after sitting up in chair a few hours yesterday.      Exercises General Exercises - Lower Extremity Ankle Circles/Pumps: PROM;Right;10 reps;Supine;Seated;AROM;Left (PROM on R, pt AROM on L) Long Arc Quad: PROM;Right;10 reps;Seated (bed in chair position, pt with reflexive RLE flexion at times but no notable muscle control when cued to attempt aside from flexion synergies) Heel Slides: AAROM;Right;10  reps;Supine Low Level/ICU Exercises Shoulder Flexion: Self ROM;Right;10 reps;Supine (using LUE at wrist to raise RUE and staff AA for technique) Elbow Flexion: Self  ROM;Right;10 reps;Supine (using LUE and staff AA for safety/technique) Other Exercises Other Exercises: PROM R Hip IR x10 reps in supine    General Comments General comments (skin integrity, edema, etc.): BP 154/96 (109) bed in chair position, BP 116/89 (99) supine with HOB flat. SpO2 97% HR 114 bpm      Pertinent Vitals/Pain Pain Assessment: Faces Faces Pain Scale: Hurts little more Pain Location: R wrist when she picks it up with LUE for repositioning (noted bruising there from previous IV site) Pain Descriptors / Indicators: Grimacing;Discomfort Pain Intervention(s): Monitored during session;Repositioned    Home Living                      Prior Function            PT Goals (current goals can now be found in the care plan section) Acute Rehab PT Goals Patient Stated Goal: to be able to go to rehab and then home PT Goal Formulation: With patient Time For Goal Achievement: 04/21/21 Progress towards PT goals: Progressing toward goals    Frequency    Min 4X/week      PT Plan Current plan remains appropriate    Co-evaluation              AM-PAC PT "6 Clicks" Mobility   Outcome Measure  Help needed turning from your back to your side while in a flat bed without using bedrails?: A Lot Help needed moving from lying on your back to sitting on the side of a flat bed without using bedrails?: A Lot Help needed moving to and from a bed to a chair (including a wheelchair)?: Total Help needed standing up from a chair using your arms (e.g., wheelchair or bedside chair)?: A Lot Help needed to walk in hospital room?: Total Help needed climbing 3-5 steps with a railing? : Total 6 Click Score: 9    End of Session   Activity Tolerance: Patient tolerated treatment well;Patient limited by fatigue Patient left: with call bell/phone within reach;in bed;with bed alarm set;Other (comment) (RLE repositioned with heel floated and to encourage hip IR (pt tending to maintain  RUE/LE in flexion synergy pattern)) Nurse Communication: Mobility status;Need for lift equipment PT Visit Diagnosis: Other abnormalities of gait and mobility (R26.89);Hemiplegia and hemiparesis Hemiplegia - Right/Left: Right Hemiplegia - dominant/non-dominant: Dominant Hemiplegia - caused by: Cerebral infarction     Time: 6387-5643 PT Time Calculation (min) (ACUTE ONLY): 20 min  Charges:  $Therapeutic Exercise: 8-22 mins                     Roshun Klingensmith P., PTA Acute Rehabilitation Services Pager: [phone] Office: [phone]    Angus Palms 04/11/2021, 5:06 PM

## 2021-04-11 NOTE — Progress Notes (Addendum)
PROGRESS NOTE  Jade [name] ZGY:[id number] DOB: 1956-07-27 DOA: 04/01/2021 PCP: System, Provider Not In   LOS: 9 days   Brief narrative:  Jade [name] is a 65 y.o. female with a history of recent CVA, COPD, CKD, hyperlipidemia, hypertension, obesity, diabetes mellitus type 2 presented to hospital with numbness and weakness of the right hand and was noted to have acute left posterior MCA branch CVA likely secondary to severe left ICA stenosis.  Vascular surgery was consulted and patient underwent TCAR on 04/06/2021.   Post operatively, Patient had increased weakness of the right side.  She has otherwise remained stable except for persistent flaccid weakness.  Currently, she is awaiting for placement.  Assessment/Plan:  Principal Problem:   Acute CVA (cerebrovascular accident) (HCC) Active Problems:   Diabetes mellitus type 2 with neurological manifestations (HCC)   Pure hypercholesterolemia   Essential hypertension, benign   Obesity, Class III, BMI 40-49.9 (morbid obesity) (HCC)   TIA (transient ischemic attack)   Carotid artery stenosis with cerebral infarction (HCC)   COPD (chronic obstructive pulmonary disease) (HCC)   Acute CVA History of recent CVA.  Patient did have left ICA stenosis more than 80%.  Neurology and vascular surgery on board.  On aspirin/Plavix, statins.  Status post TCAR on 04/06/2021 with postoperative increased weakness of the right upper extremity..  MRI postoperatively showed increasing lateral left frontoparietal infarct.  Neurology and vascular surgery following.  Neurology recommended 325 mg of aspirin and Plavix for 3 months then Plavix alone.  Continue statins.  Vascular surgery following.  Left lower extremity spasms, history of restless syndrome.  We will increase Flexeril from 5 mg to 10 mg 3 times daily.  Neurontin has been added in the nighttime.   Left ICA stenosis S/p  TCAR on 04/06/2021.  Further management as per vascular surgery.    Hyperlipidemia Continue statins.  Essential hypertension Continue amlodipine and hydrochlorothiazide as an outpatient.   On as needed hydralazine and labetalol.  Goal is to keep blood pressure between  SBP 130-160 as per neurology.   Diabetes mellitus, type II with diabetic retinopathy Diet controlled at home.  Hemoglobin A1c was 6.5.  Patient follows with ophthalmology as an outpatient for diabetic retinopathy.  Continue diabetic diet, sliding scale insulin.    COPD As needed albuterol.  Compensated.   Obesity Body mass index is 32.12 kg/m.  Would benefit from weight loss as outpatient  Hypokalemia.  Improved after replacement.  Check potassium levels in a.m.  DVT prophylaxis: heparin injection 5,000 Units Start: 04/07/21 0600 SCD's Start: 04/02/21 0208  Code Status: DNR  Family Communication:  I spoke with the patient's spouse on the phone today and updated him about the clinical condition of the patient.  Status is: Inpatient  Remains inpatient appropriate because: IV treatments appropriate due to intensity of illness or inability to take PO, Inpatient level of care appropriate due to severity of illness  Dispo: The patient is from: Home              Anticipated d/c is to: SNF /CIR               Patient currently is not medically stable to d/c.   Difficult to place patient No  Consultants: Neurology Vascular surgery  Procedures: TCAR on 04/06/2021  Anti-infectives:  None  Anti-infectives (From admission, onward)    Start     Dose/Rate Route Frequency Ordered Stop   04/06/21 1845  ceFAZolin (ANCEF) IVPB 2g/100 mL premix  2 g 200 mL/hr over 30 Minutes Intravenous Every 8 hours 04/06/21 1758 04/07/21 0602   04/06/21 0000  ceFAZolin (ANCEF) IVPB 1 g/50 mL premix       Note to Pharmacy: Send with pt to OR   1 g 100 mL/hr over 30 Minutes Intravenous On call 04/05/21 0956 04/06/21 1215      Subjective: Today, patient still complains of muscle spasms on  the left leg. No much improvement in the right upper extremity.   Objective: Vitals:   04/11/21 1053 04/11/21 1300  BP: (!) 174/101 136/74  Pulse: (!) 119   Resp: 19 18  Temp: 98.3 F (36.8 C) 98.4 F (36.9 C)  SpO2: 99% 99%    Intake/Output Summary (Last 24 hours) at 04/11/2021 1434 Last data filed at 04/11/2021 1308 Gross per 24 hour  Intake 486 ml  Output 1700 ml  Net -1214 ml    Filed Weights   04/01/21 2010 04/04/21 0500 04/06/21 1033  Weight: 77.1 kg 78.1 kg 76.2 kg   Body mass index is 31.74 kg/m.   Physical Exam:  General: Obese built, not in obvious distress HENT:   No scleral pallor or icterus noted. Oral mucosa is moist.  Left-sided surgical scar noted on the neck..   Chest:  Clear breath sounds.  Diminished breath sounds bilaterally. No crackles or wheezes.  CVS: S1 &S2 heard. No murmur.  Regular rate and rhythm. Abdomen: Soft, nontender, nondistended.  Bowel sounds are heard.   Extremities: No cyanosis, clubbing or edema.  Peripheral pulses are palpable. Psych: Alert, awake and oriented, normal mood CNS: Right upper extremity flaccid weakness 0/5, right lower extremity power of 1/5 Skin: Warm and dry.  No rashes noted.   CBC: Recent Labs  Lab 04/06/21 0345 04/07/21 0500 04/09/21 0014 04/11/21 0200  WBC 9.5 11.5* 11.3* 11.3*  HGB 14.4 12.3 12.8 12.9  HCT 43.4 36.7 37.6 37.5  MCV 95.4 95.1 95.2 94.7  PLT 317 312 289 316    Basic Metabolic Panel: Recent Labs  Lab 04/06/21 0345 04/07/21 0500 04/09/21 0014 04/11/21 0131  NA 142 138 140 138  K 3.5 4.0 3.2* 4.4  CL 107 107 104 106  CO2 24 23 25 25   GLUCOSE 130* 131* 130* 149*  BUN 24* 22 20 23   CREATININE 0.98 1.13* 1.20* 1.34*  CALCIUM 9.5 9.2 9.5 9.3  MG 2.3 2.3 2.2 1.8  PHOS  --  4.9*  --  3.0    Liver Function Tests: No results for input(s): AST, ALT, ALKPHOS, BILITOT, PROT, ALBUMIN in the last 168 hours.  No results for input(s): LIPASE, AMYLASE in the last 168 hours. No results  for input(s): AMMONIA in the last 168 hours. Cardiac Enzymes: No results for input(s): CKTOTAL, CKMB, CKMBINDEX, TROPONINI in the last 168 hours. BNP (last 3 results) No results for input(s): BNP in the last 8760 hours.  ProBNP (last 3 results) No results for input(s): PROBNP in the last 8760 hours.  CBG: Recent Labs  Lab 04/10/21 1120 04/10/21 1512 04/10/21 2112 04/11/21 0609 04/11/21 1055  GLUCAP 213* 212* 178* 123* 203*    Recent Results (from the past 240 hour(s))  SARS CORONAVIRUS 2 (TAT 6-24 HRS) Nasopharyngeal Nasopharyngeal Swab     Status: None   Collection Time: 04/02/21 12:49 AM   Specimen: Nasopharyngeal Swab  Result Value Ref Range Status   SARS Coronavirus 2 NEGATIVE NEGATIVE Final    Comment: (NOTE) SARS-CoV-2 target nucleic acids are NOT DETECTED.  The SARS-CoV-2 RNA  is generally detectable in upper and lower respiratory specimens during the acute phase of infection. Negative results do not preclude SARS-CoV-2 infection, do not rule out co-infections with other pathogens, and should not be used as the sole basis for treatment or other patient management decisions. Negative results must be combined with clinical observations, patient history, and epidemiological information. The expected result is Negative.  Fact Sheet for Patients: HairSlick.no  Fact Sheet for Healthcare Providers: quierodirigir.com  This test is not yet approved or cleared by the [place] FDA and  has been authorized for detection and/or diagnosis of SARS-CoV-2 by FDA under an Emergency Use Authorization (EUA). This EUA will remain  in effect (meaning this test can be used) for the duration of the COVID-19 declaration under Se ction 564(b)(1) of the Act, 21 U.S.C. section 360bbb-3(b)(1), unless the authorization is terminated or revoked sooner.  Performed at [facility], [address], [place], [place] [address]    Surgical pcr screen     Status: None   Collection Time: 04/06/21 10:01 AM   Specimen: Nasal Mucosa; Nasal Swab  Result Value Ref Range Status   MRSA, PCR NEGATIVE NEGATIVE Final   Staphylococcus aureus NEGATIVE NEGATIVE Final    Comment: (NOTE) The Xpert SA Assay (FDA approved for NASAL specimens in patients 66 years of age and older), is one component of a comprehensive surveillance program. It is not intended to diagnose infection nor to guide or monitor treatment. Performed at [facility], [address] [place], [place] [address]       Studies: No results found.   Joycelyn Das, MD  [facility] 04/11/2021  If 7PM-7AM, please contact night-coverage

## 2021-04-12 ENCOUNTER — Other Ambulatory Visit: Payer: Self-pay | Admitting: *Deleted

## 2021-04-12 LAB — BASIC METABOLIC PANEL
Anion gap: 9 (ref 5–15)
BUN: 18 mg/dL (ref 8–23)
CO2: 23 mmol/L (ref 22–32)
Calcium: 9.8 mg/dL (ref 8.9–10.3)
Chloride: 104 mmol/L (ref 98–111)
Creatinine, Ser: 1.19 mg/dL — ABNORMAL HIGH (ref 0.44–1.00)
GFR, Estimated: 50 mL/min — ABNORMAL LOW (ref >60)
Glucose, Bld: 127 mg/dL — ABNORMAL HIGH (ref 70–99)
Potassium: 4.2 mmol/L (ref 3.5–5.1)
Sodium: 136 mmol/L (ref 135–145)

## 2021-04-12 LAB — GLUCOSE, CAPILLARY
Glucose-Capillary: 154 mg/dL — ABNORMAL HIGH (ref 70–99)
Glucose-Capillary: 179 mg/dL — ABNORMAL HIGH (ref 70–99)
Glucose-Capillary: 230 mg/dL — ABNORMAL HIGH (ref 70–99)
Glucose-Capillary: 184 mg/dL — ABNORMAL HIGH (ref 70–99)

## 2021-04-12 NOTE — Progress Notes (Signed)
PROGRESS NOTE  Jade [name] QPY:[id number] DOB: 1955/11/14 DOA: 04/01/2021 PCP: System, Provider Not In   LOS: 10 days   Brief narrative:  Jade [name] is a 65 y.o. female with a history of recent CVA, COPD, CKD, hyperlipidemia, hypertension, obesity, diabetes mellitus type 2 presented to hospital with numbness and weakness of the right hand and was noted to have acute left posterior MCA branch CVA likely secondary to severe left ICA stenosis.  Vascular surgery was consulted and patient underwent TCAR on 04/06/2021.   Post operatively, Patient had increased weakness of the right side.  She has otherwise remained stable except for persistent flaccid weakness.  Currently, she is awaiting for placement.  Assessment/Plan:  Principal Problem:   Acute CVA (cerebrovascular accident) (HCC) Active Problems:   Diabetes mellitus type 2 with neurological manifestations (HCC)   Pure hypercholesterolemia   Essential hypertension, benign   Obesity, Class III, BMI 40-49.9 (morbid obesity) (HCC)   TIA (transient ischemic attack)   Carotid artery stenosis with cerebral infarction (HCC)   COPD (chronic obstructive pulmonary disease) (HCC)   Acute CVA History of recent CVA.  Patient did have left ICA stenosis more than 80%.  Neurology and vascular surgery on board.  On aspirin/Plavix, statins.  Status post TCAR on 04/06/2021 with postoperative increased weakness of the right upper extremity..  MRI postoperatively showed increasing lateral left frontoparietal infarct.   Neurology recommended 325 mg of aspirin and Plavix for 3 months then Plavix alone.  Continue statins.  Vascular surgery following.  Left lower extremity spasms, history of restless syndrome.  Improved after increasing Flexeril from 5 mg to 10 mg 3 times daily.  Dose of Neurontin.   Left ICA stenosis S/p  TCAR on 04/06/2021.  Continue rehabilitation.  Follow-up with vascular surgery    Hyperlipidemia Continue statins.  Essential  hypertension Continue amlodipine and hydrochlorothiazide as an outpatient.   On as needed hydralazine and labetalol.  Goal is to keep blood pressure between  SBP 130-160 as per neurology.   Diabetes mellitus, type II with diabetic retinopathy Diet controlled at home.  Hemoglobin A1c was 6.5.  Patient follows with ophthalmology as an outpatient for diabetic retinopathy.  Continue diabetic diet, sliding scale insulin.    COPD As needed albuterol.  Compensated.   Obesity Body mass index is 32.12 kg/m.  Would benefit from weight loss as outpatient  Hypokalemia.  Replenished and improved  DVT prophylaxis: heparin injection 5,000 Units Start: 04/07/21 0600 SCD's Start: 04/02/21 0208  Code Status: DNR  Family Communication:  None today.  I spoke with the patient's spouse on the phone yesterday.  Status is: Inpatient  Remains inpatient appropriate because:  awaiting for CIR.  Dispo: The patient is from: Home              Anticipated d/c is to: CIR              Patient currently is medically stable to d/c.   Difficult to place patient No  Consultants: Neurology Vascular surgery  Procedures: TCAR on 04/06/2021  Anti-infectives:  None  Anti-infectives (From admission, onward)    Start     Dose/Rate Route Frequency Ordered Stop   04/06/21 1845  ceFAZolin (ANCEF) IVPB 2g/100 mL premix        2 g 200 mL/hr over 30 Minutes Intravenous Every 8 hours 04/06/21 1758 04/07/21 0602   04/06/21 0000  ceFAZolin (ANCEF) IVPB 1 g/50 mL premix       Note to Pharmacy: Send  with pt to OR   1 g 100 mL/hr over 30 Minutes Intravenous On call 04/05/21 0956 04/06/21 1215      Subjective: Today, patient feels that her spasm in her left leg has improved.  Denies interval complaints.  Objective: Vitals:   04/12/21 0858 04/12/21 1248  BP: 136/65 114/76  Pulse: 94 98  Resp: 20 20  Temp: 98.4 F (36.9 C) 98.4 F (36.9 C)  SpO2: 93% 97%    Intake/Output Summary (Last 24 hours) at  04/12/2021 1308 Last data filed at 04/12/2021 0900 Gross per 24 hour  Intake 490 ml  Output 700 ml  Net -210 ml    Filed Weights   04/01/21 2010 04/04/21 0500 04/06/21 1033  Weight: 77.1 kg 78.1 kg 76.2 kg   Body mass index is 31.74 kg/m.   Physical Exam:  General: Obese, alert awake communicative HENT:   No scleral pallor or icterus noted. Oral mucosa is moist.  Left-sided surgical scar  Chest:  Clear breath sounds.  Diminished breath sounds bilaterally. No crackles or wheezes.  CVS: S1 &S2 heard. No murmur.  Regular rate and rhythm. Abdomen: Soft, nontender, nondistended.  Bowel sounds are heard.   Extremities: No cyanosis, clubbing or edema.  Peripheral pulses are palpable. Psych: Alert, awake and oriented, normal mood CNS: Right upper extremity motor strength 0/5 right lower extremity weakness 1/5  Skin: Warm and dry.  No rashes noted.   CBC: Recent Labs  Lab 04/06/21 0345 04/07/21 0500 04/09/21 0014 04/11/21 0200  WBC 9.5 11.5* 11.3* 11.3*  HGB 14.4 12.3 12.8 12.9  HCT 43.4 36.7 37.6 37.5  MCV 95.4 95.1 95.2 94.7  PLT 317 312 289 316    Basic Metabolic Panel: Recent Labs  Lab 04/06/21 0345 04/07/21 0500 04/09/21 0014 04/11/21 0131 04/12/21 0053  NA 142 138 140 138 136  K 3.5 4.0 3.2* 4.4 4.2  CL 107 107 104 106 104  CO2 24 23 25 25 23   GLUCOSE 130* 131* 130* 149* 127*  BUN 24* 22 20 23 18   CREATININE 0.98 1.13* 1.20* 1.34* 1.19*  CALCIUM 9.5 9.2 9.5 9.3 9.8  MG 2.3 2.3 2.2 1.8  --   PHOS  --  4.9*  --  3.0  --     Liver Function Tests: No results for input(s): AST, ALT, ALKPHOS, BILITOT, PROT, ALBUMIN in the last 168 hours.  No results for input(s): LIPASE, AMYLASE in the last 168 hours. No results for input(s): AMMONIA in the last 168 hours. Cardiac Enzymes: No results for input(s): CKTOTAL, CKMB, CKMBINDEX, TROPONINI in the last 168 hours. BNP (last 3 results) No results for input(s): BNP in the last 8760 hours.  ProBNP (last 3 results) No  results for input(s): PROBNP in the last 8760 hours.  CBG: Recent Labs  Lab 04/11/21 1055 04/11/21 1609 04/11/21 2141 04/12/21 0617 04/12/21 1246  GLUCAP 203* 124* 144* 154* 179*    Recent Results (from the past 240 hour(s))  Surgical pcr screen     Status: None   Collection Time: 04/06/21 10:01 AM   Specimen: Nasal Mucosa; Nasal Swab  Result Value Ref Range Status   MRSA, PCR NEGATIVE NEGATIVE Final   Staphylococcus aureus NEGATIVE NEGATIVE Final    Comment: (NOTE) The Xpert SA Assay (FDA approved for NASAL specimens in patients 35 years of age and older), is one component of a comprehensive surveillance program. It is not intended to diagnose infection nor to guide or monitor treatment. Performed at [facility], [address], [place], [place] [address]       Studies: No results found.   Joycelyn Das, MD  [facility] 04/12/2021  If 7PM-7AM, please contact night-coverage

## 2021-04-12 NOTE — Patient Outreach (Signed)
[facility]) Care Management  04/12/2021  Jade [name] 1955/11/12 [id number]   Member has been admitted to hospital for greater than 10 days.  Noted that she is to discharge to CIR, will close case at this time.  Kemper Durie, [place], MSN [facility] Care Management  [facility] Manager [phone]

## 2021-04-12 NOTE — Progress Notes (Signed)
[facility] [place] PROGRESS NOTE  ASSESSMENT / PLAN: Jade [name] is a 65 y.o. female status post L TCAR with periprocedural evolution of L stroke. Pending CIR admission. Continue ASA / Plavix / Statin.  SUBJECTIVE: No interval change. No new complaints.   OBJECTIVE: BP 127/76 (BP Location: Left Leg)   Pulse 100   Temp 98.8 F (37.1 C) (Oral)   Resp 20   Ht 5\' 1"  (1.549 m)   Wt 76.2 kg   SpO2 97%   BMI 31.74 kg/m   Intake/Output Summary (Last 24 hours) at 04/12/2021 0815 Last data filed at 04/11/2021 2150 Gross per 24 hour  Intake 368 ml  Output 1400 ml  Net -1032 ml    No distress.  RRR Unlabored breathing. Left neck ecchymosis unchanged. Unchanged neurologic exam. R arm weakness > R leg weakness.  CBC Latest Ref Rng & Units 04/11/2021 04/09/2021 04/07/2021  WBC 4.0 - 10.5 K/uL 11.3(H) 11.3(H) 11.5(H)  Hemoglobin 12.0 - 15.0 g/dL 06/07/2021 30.8 65.7  Hematocrit 36.0 - 46.0 % 37.5 37.6 36.7  Platelets 150 - 400 K/uL 316 289 312     CMP Latest Ref Rng & Units 04/12/2021 04/11/2021 04/09/2021  Glucose 70 - 99 mg/dL 04/11/2021) 962(X) 528(U)  BUN 8 - 23 mg/dL 18 23 20   Creatinine 0.44 - 1.00 mg/dL 132(G) ) 4.01(U)  Sodium 135 - 145 mmol/L 136 138 140  Potassium 3.5 - 5.1 mmol/L 4.2 4.4 3.2(L)  Chloride 98 - 111 mmol/L 104 106 104  CO2 22 - 32 mmol/L 23 25 25   Calcium 8.9 - 10.3 mg/dL 9.8 9.3 9.5  Total Protein 6.5 - 8.1 g/dL - - -  Total Bilirubin 0.3 - 1.2 mg/dL - - -  Alkaline Phos 38 - 126 U/L - - -  AST 15 - 41 U/L - - -  ALT 0 - 44 U/L - - -    Jade [name] N. 2.72(Z, MD [facility] Phone Number: [phone] 04/12/2021 8:15 AM

## 2021-04-12 NOTE — H&P (Signed)
Physical Medicine and Rehabilitation Admission H&P    Chief Complaint  Patient presents with   Numbness  : HPI: Jade [name] is a 65 year old right-handed female with history significant for CVA, COPD, CKD stage III hypertension, hypertension, OSA, diabetes mellitus.  Recent admission 03/05/2021 to 03/14/2021 for CVA/left inferior thalamic maintained on aspirin and Plavix maintained on aspirin and Plavix.  Per chart review patient lives with spouse.  Modified independent with rolling walker.  1 level home 4 steps to entry.  Presented 04/01/2021 with right side weakness.  CT/MRI showed small acute infarcts in the left frontal parietal junction.  Additional small focus of acute ischemia adjacent to the frontal horn of the left lateral ventricle with petechial hemorrhage.  Multiple old small vessel infarct and findings of chronic ischemic microangiopathy.  CT angiogram of head and neck severe left ICA origin stenosis due to calcified plaque, over 80%.  40% narrowing at the right ICA origin.  High-grade left M3 branch stenosis which could be related to atherosclerosis or recent embolic disease.  Admission chemistries unremarkable except potassium 3.3, creatinine 0.96, urine drug screen negative.  Echocardiogram of 03/06/2021 showed ejection fraction of 55 to 63% grade 1 diastolic dysfunction.  Vascular surgery follow-up in regards to severe left carotid artery stenosis underwent left transcarotid artery revascularization 04/06/2021 per Dr. Stanford Breed.  Currently maintained on full-strength aspirin 325 mg daily as well as Plavix for CVA prophylaxis.  Subcutaneous heparin for DVT prophylaxis x3 months then Plavix alone.  Tolerating a regular consistency diet.  Therapy evaluations completed due to patient's right side weakness decreased functional mobility was admitted for a comprehensive rehab program.  Review of Systems  Constitutional:  Negative for chills and fever.  HENT:  Negative for hearing loss.    Eyes:  Negative for blurred vision and double vision.  Respiratory:  Negative for cough and shortness of breath.   Cardiovascular:  Positive for leg swelling. Negative for chest pain and palpitations.  Gastrointestinal:  Positive for constipation. Negative for heartburn, nausea and vomiting.  Genitourinary:  Negative for dysuria, flank pain and hematuria.  Musculoskeletal:  Positive for joint pain and myalgias.  Skin:  Negative for rash.  Neurological:  Positive for speech change, weakness and headaches.  All other systems reviewed and are negative. Past Medical History:  Diagnosis Date   Arthritis    "back, knees, some in my left hip" (03/21/2018)   Chronic kidney disease    "was told I had 25% kidney function in early 2012" (03/21/2018)   COPD (chronic obstructive pulmonary disease) (HCC)    CVA (cerebral vascular accident) ([place]) 03/21/2018   "numb all over; head to toe" (03/21/2018)   Fibromyalgia    History of gout    Hyperlipidemia    Hypertension    Migraine    "used to get them monthly during menopause" (03/21/2018)   Neuromuscular disorder ([place])    OSA (obstructive sleep apnea)    "don't tolerate the machine" (03/21/2018)   Pneumonia ~ 2007   Type 2 diabetes, diet controlled ([place])    Past Surgical History:  Procedure Laterality Date   HERNIA REPAIR     LAPAROSCOPIC CHOLECYSTECTOMY  06/2007   "no UHR w/this" (03/21/2018)   TONSILLECTOMY     TRANSCAROTID ARTERY REVASCULARIZATION  Left 04/06/2021   Procedure: LEFT TRANSCAROTID ARTERY REVASCULARIZATION;  Surgeon: Cherre Robins, MD;  Location: [facility];  Service: Vascular;  Laterality: Left;   ULTRASOUND GUIDANCE FOR VASCULAR ACCESS Right 04/06/2021   Procedure: ULTRASOUND GUIDANCE  FOR VASCULAR ACCESS;  Surgeon: Cherre Robins, MD;  Location: [facility] OR;  Service: Vascular;  Laterality: Right;   UMBILICAL HERNIA REPAIR  2009   Family History  Problem Relation Age of Onset   Heart disease Father    Leukemia Father    Alcohol  abuse Son    Other Mother    Cancer Neg Hx    Diabetes Neg Hx    Hearing loss Neg Hx    Hyperlipidemia Neg Hx    Hypertension Neg Hx    Kidney disease Neg Hx    Stroke Neg Hx    Social History:  reports that she quit smoking about 33 years ago. Her smoking use included cigarettes. She has a 20.00 pack-year smoking history. She has never used smokeless tobacco. She reports that she does not currently use alcohol. She reports that she does not currently use drugs after having used the following drugs: Marijuana. Allergies:  Allergies  Allergen Reactions   Nickel Dermatitis and Rash   Medications Prior to Admission  Medication Sig Dispense Refill   amLODipine (NORVASC) 10 MG tablet Take 1 tablet (10 mg total) by mouth daily. 30 tablet 0   [EXPIRED] aspirin 81 MG EC tablet Take 1 tablet (81 mg total) by mouth daily for 21 days. Swallow whole. 21 tablet 0   atorvastatin (LIPITOR) 80 MG tablet Take 1 tablet (80 mg total) by mouth daily. 30 tablet 0   b complex vitamins capsule Take 1 capsule by mouth daily.     Cholecalciferol (VITAMIN D-3) 5000 UNITS TABS Take 5,000 Units by mouth daily.      clopidogrel (PLAVIX) 75 MG tablet Take 1 tablet (75 mg total) by mouth daily. 30 tablet 0   Coenzyme Q10 (COQ-10) 100 MG CAPS Take 100 mg by mouth at bedtime.     hydrochlorothiazide (MICROZIDE) 12.5 MG capsule Take 1 capsule (12.5 mg total) by mouth daily. 30 capsule 0   magnesium gluconate (MAGONATE) 500 MG tablet Take 1,000 mg by mouth at bedtime.     Menaquinone-7 (VITAMIN K2 PO) Take 1 tablet by mouth daily.     Omega-3 Fatty Acids (FISH OIL) 600 MG CAPS Take 600 mg by mouth daily.     Blood Glucose Calibration (ACCU-CHEK SMARTVIEW CONTROL) LIQD Test up to TID dx:E11.40 3 each 3   Blood Glucose Monitoring Suppl (ACCU-CHEK NANO SMARTVIEW) W/DEVICE KIT Test up to TID dx:E11.40 1 kit 3   glucose blood (ACCU-CHEK SMARTVIEW) test strip Test up to TID dx:E11.40 300 each 3   Lancets Misc. (ACCU-CHEK  FASTCLIX LANCET) KIT Test up to TID dx:E11.40 1 kit 2    Drug Regimen Review Drug regimen was reviewed and remains appropriate with no significant issues identified.  Home: Home Living Family/patient expects to be discharged to:: Skilled nursing facility Living Arrangements: Spouse/significant other Available Help at Discharge: Family, Available 24 hours/day (spouse works days, son, Coralyn Mark, to provide 24/7 supervision) Type of Home: House Home Access: Stairs to enter [employer] of Steps: 4 Entrance Stairs-Rails: None Home Layout: One level Bathroom Shower/Tub: Chiropodist: Handicapped height Bathroom Accessibility: Yes Home Equipment: Environmental consultant - 2 wheels, Bedside commode, Other (comment) (hurry cane) Additional Comments: Husband works during the day  Lives With: Spouse   Functional History: Prior Function Level of Independence: Needs assistance Gait / Transfers Assistance Needed: Mod I with use of RW ADL's / Homemaking Assistance Needed: Used L hand to assist R with ADLs Comments: doesn't drive  Functional Status:  Mobility:  Bed Mobility Overal bed mobility: Needs Assistance Bed Mobility: Supine to Sit Rolling: Max assist Sidelying to sit: Max assist, +2 for physical assistance, +2 for safety/equipment Supine to sit: Max assist, HOB elevated Sit to supine: Max assist, +2 for physical assistance General bed mobility comments: Pt neeeding assist for balance EOB and bringing BLE off the bed as well as trunk elevation Transfers Overall transfer level: Needs assistance Equipment used: 1 person hand held assist Transfer via Lift Equipment: Stedy Transfers: Sit to/from Stand, Risk manager Sit to Stand: Max assist Stand pivot transfers: Total assist General transfer comment: assist to bring hips forward and pull up into Stedy with R UE supported. Pt with R lateral lean in standing and while seated in [place] requiring physical assist to  maintain upright posture for transfer from chair to EOB via Stedy. Initially needed maxA +1 to stand to stedy from chair x2 reps but pt more fatigued with final stand from [facility] and needed +2 staff assist and x2 attempts to maintain upright stance and move Stedy flaps prior to sitting. (4 total stands). Pivot from chair to bed via Stedy with totalA. Ambulation/Gait Ambulation/Gait assistance: Max assist, +2 physical assistance Gait Distance (Feet): 1 Feet Assistive device: 1 person hand held assist Gait Pattern/deviations: Step-through pattern, Decreased stride length, Decreased weight shift to left, Decreased dorsiflexion - right, Staggering right, Trunk flexed General Gait Details: Unable Gait velocity: reduced Gait velocity interpretation: <1.31 ft/sec, indicative of household ambulator    ADL: ADL Overall ADL's : Needs assistance/impaired Eating/Feeding: Set up, Minimal assistance Eating/Feeding Details (indicate cue type and reason): Pt requires set up to min, due to needing to be repositioned in an upright seated position with pillows to reduce R sided lean Grooming: Wash/dry hands, Wash/dry face, Set up, Sitting Grooming Details (indicate cue type and reason): Provided a washcloth in recliner, pt able to use L hand and wash her face and R hand Upper Body Bathing: Moderate assistance Upper Body Bathing Details (indicate cue type and reason): supported sitting Lower Body Bathing: Total assistance Lower Body Bathing Details (indicate cue type and reason): Max A+2 sit<>stand Upper Body Dressing : Maximal assistance Upper Body Dressing Details (indicate cue type and reason): supported sitting Lower Body Dressing: Maximal assistance, Bed level Lower Body Dressing Details (indicate cue type and reason): Patient able to lift BLE as therapist assisted with threading legs into mesh panties. Patient then able to bridge in supine with assist to hike over hips. Additional assist required to  maintain flexed position of RLE. Toilet Transfer: Maximal assistance, +2 for physical assistance, Stand-pivot, BSC Toilet Transfer Details (indicate cue type and reason): simulated transferrring to recliner Toileting- Clothing Manipulation and Hygiene: Total assistance Toileting - Clothing Manipulation Details (indicate cue type and reason): Max A+2 sit<>stand Functional mobility during ADLs: Maximal assistance, +2 for physical assistance, +2 for safety/equipment General ADL Comments: Pt assisting with transfer to recliner to set up for breakfast so that she can self feed.  Cognition: Cognition Overall Cognitive Status: Impaired/Different from baseline Arousal/Alertness: Awake/alert Orientation Level: Oriented X4 Cognition Arousal/Alertness: Awake/alert Behavior During Therapy: WFL for tasks assessed/performed Overall Cognitive Status: Impaired/Different from baseline Area of Impairment: Memory, Problem solving, Following commands Memory: Decreased short-term memory Following Commands: Follows one step commands with increased time, Follows multi-step commands inconsistently Problem Solving: Slow processing, Difficulty sequencing, Requires verbal cues, Requires tactile cues General Comments: Some word finding difficulty noted but with increased time and some assist from staff, able to mostly express her needs to  therapist. Pt pleasantly cooperative.  Physical Exam: Blood pressure (!) 113/93, pulse 94, temperature 98.1 F (36.7 C), resp. rate 18, height _0  (1.549 m), weight 76.2 kg, SpO2 97 %. Physical Exam Constitutional:      General: She is not in acute distress.    Appearance: She is obese.  HENT:     Head: Normocephalic.     Right Ear: External ear normal.     Left Ear: External ear normal.     Nose: Nose normal.     Mouth/Throat:     Mouth: Mucous membranes are moist.     Pharynx: No oropharyngeal exudate.  Eyes:     Extraocular Movements: Extraocular movements intact.      Conjunctiva/sclera: Conjunctivae normal.     Pupils: Pupils are equal, round, and reactive to light.  Cardiovascular:     Rate and Rhythm: Normal rate and regular rhythm.     Heart sounds:    Gallop present.  Pulmonary:     Effort: Pulmonary effort is normal. No respiratory distress.     Breath sounds: No wheezing.  Abdominal:     General: Bowel sounds are normal. There is no distension.     Palpations: Abdomen is soft.     Tenderness: There is no abdominal tenderness.  Musculoskeletal:        General: Swelling present.     Cervical back: Normal range of motion. No rigidity.  Skin:    General: Skin is warm.     Findings: Bruising and lesion (scalp) present.     Comments: Left neck incision CDI, mild bruising  Neurological:     Mental Status: She is alert.     Comments: Alert and oriented x 3. Normal insight and awareness. Intact Memory. Normal language and speech. Cranial nerve exam non-focal. RUE tr pec but 0/5 otherwise. RLE trace HE but 0/5 otherwise. Sl decrease in LT RUE and RLE. DTR's 2+ RUE and 3+ RLE. LUE and LLE 5/5 with normal sensation.   Psychiatric:        Mood and Affect: Mood normal.        Behavior: Behavior normal.    Results for orders placed or performed during the hospital encounter of 04/01/21 (from the past 48 hour(s))  Glucose, capillary     Status: Abnormal   Collection Time: 04/11/21 10:55 AM  Result Value Ref Range   Glucose-Capillary 203 (H) 70 - 99 mg/dL    Comment: Glucose reference range applies only to samples taken after fasting for at least 8 hours.  Glucose, capillary     Status: Abnormal   Collection Time: 04/11/21  4:09 PM  Result Value Ref Range   Glucose-Capillary 124 (H) 70 - 99 mg/dL    Comment: Glucose reference range applies only to samples taken after fasting for at least 8 hours.  Glucose, capillary     Status: Abnormal   Collection Time: 04/11/21  9:41 PM  Result Value Ref Range   Glucose-Capillary 144 (H) 70 - 99 mg/dL     Comment: Glucose reference range applies only to samples taken after fasting for at least 8 hours.   Comment 1 Notify RN    Comment 2 Document in Chart   Basic metabolic panel     Status: Abnormal   Collection Time: 04/12/21 12:53 AM  Result Value Ref Range   Sodium 136 135 - 145 mmol/L   Potassium 4.2 3.5 - 5.1 mmol/L   Chloride 104 98 - 111 mmol/L  CO2 23 22 - 32 mmol/L   Glucose, Bld 127 (H) 70 - 99 mg/dL    Comment: Glucose reference range applies only to samples taken after fasting for at least 8 hours.   BUN 18 8 - 23 mg/dL   Creatinine, Ser 1.19 (H) 0.44 - 1.00 mg/dL   Calcium 9.8 8.9 - 10.3 mg/dL   GFR, Estimated 50 (L) >60 mL/min    Comment: (NOTE) Calculated using the CKD-EPI Creatinine Equation (2021)    Anion gap 9 5 - 15    Comment: Performed at [facility] [address] [place], [place] [address]  Glucose, capillary     Status: Abnormal   Collection Time: 04/12/21  6:17 AM  Result Value Ref Range   Glucose-Capillary 154 (H) 70 - 99 mg/dL    Comment: Glucose reference range applies only to samples taken after fasting for at least 8 hours.   Comment 1 Notify RN    Comment 2 Document in Chart   Glucose, capillary     Status: Abnormal   Collection Time: 04/12/21 12:46 PM  Result Value Ref Range   Glucose-Capillary 179 (H) 70 - 99 mg/dL    Comment: Glucose reference range applies only to samples taken after fasting for at least 8 hours.  Glucose, capillary     Status: Abnormal   Collection Time: 04/12/21  4:43 PM  Result Value Ref Range   Glucose-Capillary 230 (H) 70 - 99 mg/dL    Comment: Glucose reference range applies only to samples taken after fasting for at least 8 hours.  Glucose, capillary     Status: Abnormal   Collection Time: 04/12/21  8:08 PM  Result Value Ref Range   Glucose-Capillary 184 (H) 70 - 99 mg/dL    Comment: Glucose reference range applies only to samples taken after fasting for at least 8 hours.  CBC     Status: Abnormal    Collection Time: 04/13/21  2:05 AM  Result Value Ref Range   WBC 13.8 (H) 4.0 - 10.5 K/uL   RBC 4.27 3.87 - 5.11 MIL/uL   Hemoglobin 13.6 12.0 - 15.0 g/dL   HCT 40.0 36.0 - 46.0 %   MCV 93.7 80.0 - 100.0 fL   MCH 31.9 26.0 - 34.0 pg   MCHC 34.0 30.0 - 36.0 g/dL   RDW 13.2 11.5 - 15.5 %   Platelets 363 150 - 400 K/uL   nRBC 0.0 0.0 - 0.2 %    Comment: Performed at [facility], [place] [address] [place], [place] [address]  Basic metabolic panel     Status: Abnormal   Collection Time: 04/13/21  2:05 AM  Result Value Ref Range   Sodium 136 135 - 145 mmol/L   Potassium 4.0 3.5 - 5.1 mmol/L   Chloride 100 98 - 111 mmol/L   CO2 25 22 - 32 mmol/L   Glucose, Bld 143 (H) 70 - 99 mg/dL    Comment: Glucose reference range applies only to samples taken after fasting for at least 8 hours.   BUN 27 (H) 8 - 23 mg/dL   Creatinine, Ser 1.49 (H) 0.44 - 1.00 mg/dL   Calcium 10.1 8.9 - 10.3 mg/dL   GFR, Estimated 39 (L) >60 mL/min    Comment: (NOTE) Calculated using the CKD-EPI Creatinine Equation (2021)    Anion gap 11 5 - 15    Comment: Performed at [facility] [address] [place], [place] [address]  Glucose,  capillary     Status: Abnormal   Collection Time: 04/13/21  6:05 AM  Result Value Ref Range   Glucose-Capillary 159 (H) 70 - 99 mg/dL    Comment: Glucose reference range applies only to samples taken after fasting for at least 8 hours.   No results found.     Medical Problem List and Plan: 1.  Right side weakness secondary to extension of acute infarct of the left precentral gyrus, new b/l PCA small infarcts etiology likely intracranial stenosis in the setting of fluctuating BP  -patient may  shower  -ELOS/Goals: 20-24 days, min assist with PT and OT  -admit to inpatient rehab  -PRAFO/WHO ordered 2.  Antithrombotics: -DVT/anticoagulation: Subcutaneous heparin   -antiplatelet therapy: Aspirin 325 mg daily and Plavix 75 mg daily x3 months then Plavix alone 3. Pain  Management: Neurontin 200 mg nightly, oxycodone as needed, Flexeril as needed 4. Mood: Provide emotional support  -antipsychotic agents: N/A 5. Neuropsych: This patient is capable of making decisions on her own behalf. 6. Skin/Wound Care: Routine skin checks 7. Fluids/Electrolytes/Nutrition: Routine and outs with follow-up chemistries ordered 8.  Symptomatic left ICA stenosis.  Status post left transcarotid artery revascularization 04/06/2021.  Vascular surgery following 9.  Hypertension.  Norvasc 10 mg daily, HCTZ 12.5 mg daily.  Monitor with increased mobility 10.  Diabetes mellitus.  Hemoglobin A1c 6.5.  SSI.  -monitor CBG's AC/HS 11.  Hyperlipidemia.  Lipitor 12.  GERD.  Protonix 13.  CKD stage III.  Baseline creatinine 1.18-1.34.  Follow-up chemistries 14.  Obesity.  BMI 31.74.  Dietary follow-up 15.  COPD.  Quit smoking 33 years ago.  Check oxygen saturations every shift   Cathlyn Parsons, PA-C 04/13/2021

## 2021-04-12 NOTE — PMR Pre-admission (Signed)
PMR Admission Coordinator Pre-Admission Assessment  Patient: Jade [name] is an 65 y.o., female MRN: [id number] DOB: 05-27-1956 Height: 5\' 1"  (154.9 cm) Weight: 76.2 kg  Insurance Information HMO: yes    PPO:      PCP:      IPA:      80/20:      OTHER:  PRIMARY: [place] Dual Complete      Policy#: [id number]      Subscriber: pt CM Name: Wilburn Cornelia      Phone#: [phone] option #7     Fax#: [phone] Pre-Cert#: [id number] approved for 7 days      Employer:  Benefits:  Phone #: [phone]     Name: 8/16 Eff. Date: 08/27/2020     Deduct: none      Out of Pocket Max: $7550      Life Max: none CIR: $1480 per admission with dual Medicaid coverage      SNF: no copay ment for days 1 until 20; $194.50 cop ay per day days 21 until 100 Outpatient: 100%     Co-Pay:  Home Health: 100%      Co-Pay: visits per medical neccesity DME: 80%     Co-Pay: 20% Providers: in network  SECONDARY   Medicaid of [place] Policy#: [id number] Currently inactive on 8/18 per Medicaid passport one source. Patient made aware on 8/18    Financial Counselor:       Phone#:   The "Data Collection Information Summary" for patients in Inpatient Rehabilitation Facilities with attached "Privacy Act [place] Records" was provided and verbally reviewed with: Patient  Emergency Contact Information Contact Information     Name Relation Home Work Mobile   Hassing,Jose Spouse  [phone]   Teer, Arsenio Loader   [phone]       Current Medical History  Patient Admitting Diagnosis: CVA  History of Present Illness:  65 year old right-handed female with history significant for CVA, COPD, CKD stage III hypertension, hypertension, OSA, diabetes mellitus.  Recent admission 03/05/2021 to 03/14/2021 for CVA/left inferior thalamic maintained on aspirin and Plavix maintained on aspirin and Plavix.  Per chart review patient lives with spouse.  Modified independent with rolling walker.  1 level home 4  steps to entry.  Presented 04/01/2021 with right side weakness.  CT/MRI showed small acute infarcts in the left frontal parietal junction.  Additional small focus of acute ischemia adjacent to the frontal horn of the left lateral ventricle with petechial hemorrhage.  Multiple old small vessel infarct and findings of chronic ischemic microangiopathy.  CT angiogram of head and neck severe left ICA origin stenosis due to calcified plaque, over 80%.  40% narrowing at the right ICA origin.  High-grade left M3 branch stenosis which could be related to atherosclerosis or recent embolic disease.  Admission chemistries unremarkable except potassium 3.3, creatinine 0.96, urine drug screen negative.  Echocardiogram of 03/06/2021 showed ejection fraction of 55 to 61% grade 1 diastolic dysfunction.  Vascular surgery follow-up in regards to severe left carotid artery stenosis underwent left transcarotid artery revascularization 04/06/2021 per Dr. Stanford Breed.  Currently maintained on full-strength aspirin 325 mg daily as well as Plavix for CVA prophylaxis.  Subcutaneous heparin for DVT prophylaxis x3 months then Plavix alone.  Tolerating a regular consistency diet.   Complete NIHSS TOTAL: 13  Patient's medical record from [facility]  has been reviewed by the rehabilitation admission coordinator and physician.  Past Medical History  Past Medical History:  Diagnosis Date   Arthritis    "  back, knees, some in my left hip" (03/21/2018)   Chronic kidney disease    "was told I had 25% kidney function in early 2012" (03/21/2018)   COPD (chronic obstructive pulmonary disease) (HCC)    CVA (cerebral vascular accident) ([place]) 03/21/2018   "numb all over; head to toe" (03/21/2018)   Fibromyalgia    History of gout    Hyperlipidemia    Hypertension    Migraine    "used to get them monthly during menopause" (03/21/2018)   Neuromuscular disorder ([place])    OSA (obstructive sleep apnea)    "don't tolerate the machine"  (03/21/2018)   Pneumonia ~ 2007   Type 2 diabetes, diet controlled ([place])     Family History   family history includes Alcohol abuse in her son; Heart disease in her father; Leukemia in her father; Other in her mother.  Prior Rehab/Hospitalizations Has the patient had prior rehab or hospitalizations prior to admission? Yes  Has the patient had major surgery during 100 days prior to admission? Yes   Current Medications  Current Facility-Administered Medications:    acetaminophen (TYLENOL) tablet 650 mg, 650 mg, Oral, Q4H PRN **OR** acetaminophen (TYLENOL) 160 MG/5ML solution 650 mg, 650 mg, Per Tube, Q4H PRN **OR** acetaminophen (TYLENOL) suppository 650 mg, 650 mg, Rectal, Q4H PRN, Baglia, Corrina, PA-C   alum & mag hydroxide-simeth (MAALOX/MYLANTA) 200-200-20 MG/5ML suspension 15-30 mL, 15-30 mL, Oral, Q2H PRN, Baglia, Corrina, PA-C   amLODipine (NORVASC) tablet 10 mg, 10 mg, Oral, Daily, Pokhrel, Laxman, MD, 10 mg at 04/13/21 0900   aspirin EC tablet 325 mg, 325 mg, Oral, Daily, [name], MD, 325 mg at 04/13/21 0900   atorvastatin (LIPITOR) tablet 80 mg, 80 mg, Oral, Daily, Baglia, Corrina, PA-C, 80 mg at 04/13/21 0901   clopidogrel (PLAVIX) tablet 75 mg, 75 mg, Oral, Daily, Baglia, Corrina, PA-C, 75 mg at 04/13/21 0900   cyclobenzaprine (FLEXERIL) tablet 10 mg, 10 mg, Oral, TID PRN, Pokhrel, Laxman, MD, 10 mg at 04/11/21 1822   docusate sodium (COLACE) capsule 100 mg, 100 mg, Oral, BID, Pokhrel, Laxman, MD, 100 mg at 04/13/21 0900   gabapentin (NEURONTIN) capsule 200 mg, 200 mg, Oral, QHS, Pokhrel, Laxman, MD, 200 mg at 04/12/21 2111   guaiFENesin-dextromethorphan (ROBITUSSIN DM) 100-10 MG/5ML syrup 15 mL, 15 mL, Oral, Q4H PRN, Baglia, Corrina, PA-C   heparin injection 5,000 Units, 5,000 Units, Subcutaneous, Q8H, Baglia, Corrina, PA-C, 5,000 Units at 04/13/21 5188   hydrALAZINE (APRESOLINE) injection 10 mg, 10 mg, Intravenous, Q4H PRN, Pokhrel, Laxman, MD, 10 mg at 04/11/21 1606    hydrochlorothiazide (MICROZIDE) capsule 12.5 mg, 12.5 mg, Oral, Daily, Pokhrel, Laxman, MD, 12.5 mg at 04/13/21 0900   insulin aspart (novoLOG) injection 0-9 Units, 0-9 Units, Subcutaneous, TID AC & HS, Pokhrel, Laxman, MD, 2 Units at 04/13/21 0630   labetalol (NORMODYNE) injection 10 mg, 10 mg, Intravenous, Q2H PRN, Pokhrel, Laxman, MD, 10 mg at 04/11/21 1129   magnesium oxide (MAG-OX) tablet 400 mg, 400 mg, Oral, Daily, Cherre Robins, MD, 400 mg at 04/13/21 0901   magnesium sulfate IVPB 2 g 50 mL, 2 g, Intravenous, Daily PRN, Baglia, Corrina, PA-C   metoprolol tartrate (LOPRESSOR) injection 2-5 mg, 2-5 mg, Intravenous, Q2H PRN, Baglia, Corrina, PA-C   morphine 2 MG/ML injection 2-5 mg, 2-5 mg, Intravenous, Q1H PRN, Baglia, Corrina, PA-C   ondansetron (ZOFRAN) injection 4 mg, 4 mg, Intravenous, Q6H PRN, Baglia, Corrina, PA-C   oxyCODONE-acetaminophen (PERCOCET/ROXICET) 5-325 MG per tablet 1-2 tablet, 1-2 tablet, Oral, Q4H PRN,  Baglia, Corrina, PA-C, 1 tablet at 04/11/21 2102   pantoprazole (PROTONIX) EC tablet 40 mg, 40 mg, Oral, Daily, Baglia, Corrina, PA-C, 40 mg at 04/13/21 0901   phenol (CHLORASEPTIC) mouth spray 1 spray, 1 spray, Mouth/Throat, PRN, Baglia, Corrina, PA-C   polyethylene glycol (MIRALAX / GLYCOLAX) packet 17 g, 17 g, Oral, Daily, Pokhrel, Laxman, MD, 17 g at 04/13/21 0901   senna-docusate (Senokot-S) tablet 1 tablet, 1 tablet, Oral, QHS PRN, Baglia, Corrina, PA-C  Patients Current Diet:  Diet Order             Diet - low sodium heart healthy           Diet heart healthy/carb modified Room service appropriate? Yes with Assist; Fluid consistency: Thin  Diet effective now                   Precautions / Restrictions Precautions Precautions: Fall Precaution Comments: post CVA right hemiplegia, diplopia, SBP goal 130-160 per MD note Restrictions Weight Bearing Restrictions: No   Has the patient had 2 or more falls or a fall with injury in the past year?  Yes  Prior Activity Level Limited Community (1-2x/wk): Mod I with RW, did not drive  Prior Functional Level Self Care: Did the patient need help bathing, dressing, using the toilet or eating? Needed some help  Indoor Mobility: Did the patient need assistance with walking from room to room (with or without device)? Needed some help  Stairs: Did the patient need assistance with internal or external stairs (with or without device)? Needed some help  Functional Cognition: Did the patient need help planning regular tasks such as shopping or remembering to take medications? Independent  Home Assistive Devices / Equipment Home Assistive Devices/Equipment: None Home Equipment: Walker - 2 wheels, Bedside commode, Other (comment) (hurry cane)  Prior Device Use: Indicate devices/aids used by the patient prior to current illness, exacerbation or injury? Walker  Current Functional Level Cognition  Arousal/Alertness: Awake/alert Overall Cognitive Status: Impaired/Different from baseline Orientation Level: Oriented X4 Following Commands: Follows one step commands with increased time, Follows multi-step commands inconsistently General Comments: Some word finding difficulty noted but with increased time and some assist from staff, able to mostly express her needs to therapist. Pt pleasantly cooperative.    Extremity Assessment (includes Sensation/Coordination)  Upper Extremity Assessment: RUE deficits/detail RUE Deficits / Details: Unable to maintain position of RUE in space against gravity; unable to make a fist. Can adduct UE toward midline (non fluid movement). Felt some muscle activation in forearm when stretching UE out towards Stedy (unsure if resistance, spasticity, etc) RUE Sensation: decreased light touch RUE Coordination: decreased fine motor, decreased gross motor  Lower Extremity Assessment: Defer to PT evaluation RLE Deficits / Details: Generalized weakness noted; apraxia noted RLE  Coordination: decreased fine motor, decreased gross motor    ADLs  Overall ADL's : Needs assistance/impaired Eating/Feeding: Set up, Minimal assistance Eating/Feeding Details (indicate cue type and reason): Pt requires set up to min, due to needing to be repositioned in an upright seated position with pillows to reduce R sided lean Grooming: Wash/dry hands, Wash/dry face, Set up, Sitting Grooming Details (indicate cue type and reason): Provided a washcloth in recliner, pt able to use L hand and wash her face and R hand Upper Body Bathing: Moderate assistance Upper Body Bathing Details (indicate cue type and reason): supported sitting Lower Body Bathing: Total assistance Lower Body Bathing Details (indicate cue type and reason): Max A+2 sit<>stand Upper Body Dressing :  Maximal assistance Upper Body Dressing Details (indicate cue type and reason): supported sitting Lower Body Dressing: Maximal assistance, Bed level Lower Body Dressing Details (indicate cue type and reason): Patient able to lift BLE as therapist assisted with threading legs into mesh panties. Patient then able to bridge in supine with assist to hike over hips. Additional assist required to maintain flexed position of RLE. Toilet Transfer: Maximal assistance, +2 for physical assistance, Stand-pivot, BSC Toilet Transfer Details (indicate cue type and reason): simulated transferrring to recliner Toileting- Clothing Manipulation and Hygiene: Total assistance Toileting - Clothing Manipulation Details (indicate cue type and reason): Max A+2 sit<>stand Functional mobility during ADLs: Maximal assistance, +2 for physical assistance, +2 for safety/equipment General ADL Comments: Pt assisting with transfer to recliner to set up for breakfast so that she can self feed.    Mobility  Overal bed mobility: Needs Assistance Bed Mobility: Supine to Sit Rolling: Max assist Sidelying to sit: Max assist, +2 for physical assistance, +2 for  safety/equipment Supine to sit: Max assist, HOB elevated Sit to supine: Max assist, +2 for physical assistance General bed mobility comments: Pt neeeding assist for balance EOB and bringing BLE off the bed as well as trunk elevation    Transfers  Overall transfer level: Needs assistance Equipment used: 1 person hand held assist Transfer via Lift Equipment: Stedy Transfers: Sit to/from Stand, Risk manager Sit to Stand: Max assist Stand pivot transfers: Total assist General transfer comment: assist to bring hips forward and pull up into Stedy with R UE supported. Pt with R lateral lean in standing and while seated in [place] requiring physical assist to maintain upright posture for transfer from chair to EOB via Stedy. Initially needed maxA +1 to stand to stedy from chair x2 reps but pt more fatigued with final stand from [facility] and needed +2 staff assist and x2 attempts to maintain upright stance and move Stedy flaps prior to sitting. (4 total stands). Pivot from chair to bed via Stedy with totalA.    Ambulation / Gait / Stairs / Wheelchair Mobility  Ambulation/Gait Ambulation/Gait assistance: Max assist, +2 physical assistance Gait Distance (Feet): 1 Feet Assistive device: 1 person hand held assist Gait Pattern/deviations: Step-through pattern, Decreased stride length, Decreased weight shift to left, Decreased dorsiflexion - right, Staggering right, Trunk flexed General Gait Details: Unable Gait velocity: reduced Gait velocity interpretation: <1.31 ft/sec, indicative of household ambulator    Posture / Balance Dynamic Sitting Balance Sitting balance - Comments: Pt requiring max support to sit EOB Balance Overall balance assessment: Needs assistance Sitting-balance support: Single extremity supported, Feet supported Sitting balance-Leahy Scale: Poor (Poor to Zero) Sitting balance - Comments: Pt requiring max support to sit EOB Postural control: Right lateral lean, Posterior  lean Standing balance support: Bilateral upper extremity supported Standing balance-Leahy Scale: Zero Standing balance comment: unable to fully stand    Special needs/care consideration Hgb A1c 6.5   Previous Home Environment  Living Arrangements: Spouse/significant other  Lives With: Spouse Available Help at Discharge: Family, Available 24 hours/day (spouse works days, son, Coralyn Mark, to provide 24/7 supervision) Type of Home: House Home Layout: One level Home Access: Stairs to enter Entrance Stairs-Rails: None Entrance Stairs-Number of Steps: 4 Bathroom Shower/Tub: Chiropodist: Handicapped height Bathroom Accessibility: Yes How Accessible: Accessible via walker Home Care Services: No Additional Comments: Husband works during the day  Discharge Living Setting Plans for Discharge Living Setting: Patient's home, Lives with (comment) (spouse) Type of Home at Discharge: House Discharge Home Layout:  One level Discharge Home Access: Stairs to enter Entrance Stairs-Rails: None Entrance Stairs-Number of Steps: 4 Discharge Bathroom Shower/Tub: Tub/shower unit Discharge Bathroom Toilet: Handicapped height Discharge Bathroom Accessibility: Yes How Accessible: Accessible via walker Does the patient have any problems obtaining your medications?: No  Social/Family/Support Systems Patient Roles: Spouse, Parent Contact Information: son, Coralyn Mark. Spouse non English speaking Anticipated Caregiver: son, Coralyn Mark Anticipated Ambulance person Information: see above Ability/Limitations of Caregiver: spouse works days. Son , Coralyn Mark , unemployed Caregiver Availability: 24/7 Discharge Plan Discussed with Primary Caregiver: Yes Is Caregiver In Agreement with Plan?: Yes Does Caregiver/Family have Issues with Lodging/Transportation while Pt is in Rehab?: No  Son, Coralyn Mark, lives in converted "RV" parked across the street form her home for several weeks. We have had extensive discussions  concerning the need for him to be in his Mom;s home when her spouse works to provide 24/7 physical care. Not just checking on her every hour for she has history of multiple falls. He states agreement.   Goals Patient/Family Goal for Rehab: Min assist with PT and OT Expected length of stay: [facility] 2 to 3 weeks; Son asking for 4 to 5 weeks which I explained is not typical and unlikely Pt/Family Agrees to Admission and willing to participate: Yes Program Orientation Provided & Reviewed with Pt/Caregiver Including Roles  & Responsibilities: Yes  Decrease burden of Care through IP rehab admission: n/a  Possible need for SNF placement upon discharge: Patient denies SNF and SNF's will not offer a bed for she is unvaccinated and unwilling to be vaccinated. I have discussed with patient and her son, Coralyn Mark, who supports her to be unvaccinated at this time for COVID>  Patient Condition: I have reviewed medical records from [facility] , spoken with CM, and patient and son. I met with patient at the bedside for inpatient rehabilitation assessment.  Patient will benefit from ongoing PT and OT, can actively participate in 3 hours of therapy a day 5 days of the week, and can make measurable gains during the admission.  Patient will also benefit from the coordinated team approach during an Inpatient Acute Rehabilitation admission.  The patient will receive intensive therapy as well as Rehabilitation physician, nursing, social worker, and care management interventions.  Due to bladder management, bowel management, safety, skin/wound care, disease management, medication administration, pain management, and patient education the patient requires 24 hour a day rehabilitation nursing.  The patient is currently mod to max assist overall with mobility and basic ADLs.  Discharge setting and therapy post discharge at home with home health is anticipated.  Patient has agreed to participate in the Acute Inpatient  Rehabilitation Program and will admit today.  Preadmission Screen Completed By:  Cleatrice Burke, 04/13/2021 11:18 AM ______________________________________________________________________   Discussed status with Dr. Naaman Plummer on  04/13/2021 at 1119 and received approval for admission today.  Admission Coordinator:  Cleatrice Burke, RN, time  1119 Date 04/13/2021   Assessment/Plan: Diagnosis: CVA Does the need for close, 24 hr/day Medical supervision in concert with the patient's rehab needs make it unreasonable for this patient to be served in a less intensive setting? Yes Co-Morbidities requiring supervision/potential complications: COPD, CKD, HTN, OSA, DM Due to bladder management, bowel management, safety, skin/wound care, disease management, medication administration, pain management, and patient education, does the patient require 24 hr/day rehab nursing? Yes Does the patient require coordinated care of a physician, rehab nurse, PT, OT  to address physical and functional deficits in the context of the above  medical diagnosis(es)? Yes Addressing deficits in the following areas: balance, endurance, locomotion, strength, transferring, bowel/bladder control, bathing, dressing, feeding, grooming, toileting, and psychosocial support Can the patient actively participate in an intensive therapy program of at least 3 hrs of therapy 5 days a week? Yes The potential for patient to make measurable gains while on inpatient rehab is excellent Anticipated functional outcomes upon discharge from inpatient rehab: min assist PT, min assist OT, n/a SLP Estimated rehab length of stay to reach the above functional goals is: 2-3 weeks Anticipated discharge destination: Home 10. Overall Rehab/Functional Prognosis: excellent   MD Signature: Meredith Staggers, MD, [place] 04/13/2021

## 2021-04-12 NOTE — Progress Notes (Signed)
Physical Therapy Treatment Patient Details Name: Jade [name] MRN: [id number] DOB: 1956/01/25 Today's Date: 04/12/2021    History of Present Illness Pt is a 65 y.o. female who presented 04/01/21 with R-sided weakness. MRI reveals an embolic appearing stroke on the left, affecting a posterior MCA branch. Imaging revealed severe L ICA origin stenosis. Of note, a few weeks ago pt had stroke affecting her vision and R-hand strength, MRI then revealed acute L thalamic , anterior left frontal and possible right frontal and multiple remote lacunar infarcts. 8/11 pt s/p Left transcarotid artery revascularization (TCAR) with worsening right sided weakness immediately post-op with MRI evidence of expansion of previous left frontoparietal infarct. PMH: arthritis, CKD, COPD, CVA, fibromyalgia, gout, HTN, migraine, OSA, DM2.    PT Comments    Pt received in recliner, c/o fatigue after working with OT ~1 hour prior and self-feeding for breakfast but agreeable to therapy session and to work on getting back into bed. Pt needing increased assist to stand from chair and Stedy heights (+1-2 maxA) and needing increased assist as she fatigued (4 total standing trials). Emphasis on seated/standing balance this session, pt needing mod to maxA trunk support (totalA to maintain flaccid RUE on Stedy railing) to maintain upright posture in [place] for ~5 minutes and notably diaphoretic with increased HR with increased time upright in [place]. HR max ~140 bpm with exertion and decreased to 110 bpm and lower once back in bed. Pt continues to benefit from PT services to progress toward functional mobility goals. Continue to recommend CIR.  Follow Up Recommendations  CIR;Supervision/Assistance - 24 hour     Equipment Recommendations  Wheelchair (measurements PT);Wheelchair cushion (measurements PT) (Left HW vs slide board pending progress)    Recommendations for Other Services       Precautions / Restrictions  Precautions Precautions: Fall Precaution Comments: post CVA right hemiplegia, diplopia, SBP goal 130-160 per MD note Restrictions Weight Bearing Restrictions: No    Mobility  Bed Mobility Overal bed mobility: Needs Assistance Bed Mobility: Sit to Supine       Sit to supine: Max assist;+2 for physical assistance   General bed mobility comments: Once back to EOB, therapist utilized helicopter method for UE support and LE support, pt very fatigued and only minimally able to assist with weight shifting on L UE/LE side (R side no voluntary control)    Transfers Overall transfer level: Needs assistance Equipment used: Ambulation equipment used Transfers: Sit to/from Stand Sit to Stand: Max assist Stand pivot transfers: Total assist       General transfer comment: assist to bring hips forward and pull up into Stedy with R UE supported. Pt with R lateral lean in standing and while seated in [place] requiring physical assist to maintain upright posture for transfer from chair to EOB via Stedy. Initially needed maxA +1 to stand to stedy from chair x2 reps but pt more fatigued with final stand from [facility] and needed +2 staff assist and x2 attempts to maintain upright stance and move Stedy flaps prior to sitting. (4 total stands). Pivot from chair to bed via Stedy with totalA.  Ambulation/Gait             General Gait Details: Unable   Stairs             Wheelchair Mobility    Modified Rankin (Stroke Patients Only) Modified Rankin (Stroke Patients Only) Pre-Morbid Rankin Score: Moderate disability Modified Rankin: Severe disability     Balance Overall balance assessment: Needs assistance  Sitting-balance support: Single extremity supported;Feet supported Sitting balance-Leahy Scale:  (Poor to Zero) Sitting balance - Comments: pt able to use L armrest to maintain upright posture in chair (without back support) for 5-30 seconds at a time prior to needing to rest/lean to R  side and back support; Up in [place], pt needing mod/maxA trunk support for upright sitting in device ~5 minutes with focus on upright gaze, pulling up with LUE and weight shifting to midline. Postural control: Right lateral lean;Posterior lean Standing balance support: Bilateral upper extremity supported Standing balance-Leahy Scale: Zero Standing balance comment: requires support of L UE on Stedy and +2 external support due to R lateral lean and RUE flaccidity                            Cognition Arousal/Alertness: Awake/alert Behavior During Therapy: WFL for tasks assessed/performed Overall Cognitive Status: Impaired/Different from baseline Area of Impairment: Memory;Problem solving;Following commands                     Memory: Decreased short-term memory Following Commands: Follows one step commands with increased time;Follows multi-step commands inconsistently     Problem Solving: Slow processing;Difficulty sequencing;Requires verbal cues;Requires tactile cues General Comments: Some word finding difficulty noted but with increased time and some assist from staff, able to mostly express her needs to therapist. Pt pleasantly cooperative.      Exercises General Exercises - Lower Extremity Ankle Circles/Pumps: PROM;Right;Supine;Seated;AROM;Left;20 reps (PROM on R, pt AROM on L) Quad Sets: AROM;Left;10 reps;Supine Gluteal Sets: AROM;10 reps;Supine Long Arc Quad: PROM;Right;Seated;AROM;20 reps (pt with reflexive RLE flexion at times but no notable muscle control when cued to attempt aside from flexion synergies; AROM on LLE) Hip ABduction/ADduction: PROM;Right;Supine;5 reps Hip Flexion/Marching: AROM;Left;5 reps;Seated Other Exercises Other Exercises: PROM R Hip IR x10 reps in supine    General Comments General comments (skin integrity, edema, etc.): BP 144/94 (108) reclined in chair taken on LLE; pt HR up to 140 bpm with exertion/standing in [place]; mostly 110-130  bpm; pt diaphoretic with longer time sitting up in [place] but unable to check LLE BP when standing due to lack of +2 assist at that time.      Pertinent Vitals/Pain Pain Assessment: Faces Faces Pain Scale: Hurts even more Pain Location: R wrist when she picks it up with LUE for repositioning (noted bruising there from previous IV site), RLE/RUE discomfort/cramps intermittently Pain Descriptors / Indicators: Grimacing;Discomfort;Cramping;Contraction Pain Intervention(s): Limited activity within patient's tolerance;Monitored during session;Repositioned;Premedicated before session (pt reports she had flexeril earlier in the day)    Home Living                      Prior Function            PT Goals (current goals can now be found in the care plan section) Acute Rehab PT Goals Patient Stated Goal: to be able to go to rehab and then home PT Goal Formulation: With patient Time For Goal Achievement: 04/21/21 Progress towards PT goals: Progressing toward goals    Frequency    Min 4X/week      PT Plan Current plan remains appropriate    Co-evaluation              AM-PAC PT "6 Clicks" Mobility   Outcome Measure  Help needed turning from your back to your side while in a flat bed without using bedrails?: A Lot Help needed moving  from lying on your back to sitting on the side of a flat bed without using bedrails?: Total Help needed moving to and from a bed to a chair (including a wheelchair)?: Total Help needed standing up from a chair using your arms (e.g., wheelchair or bedside chair)?: A Lot Help needed to walk in hospital room?: Total Help needed climbing 3-5 steps with a railing? : Total 6 Click Score: 8    End of Session Equipment Utilized During Treatment: Gait belt Activity Tolerance: Patient tolerated treatment well Patient left: with call bell/phone within reach;in bed;with bed alarm set;Other (comment) (RLE repositioned with heel floated and to encourage  hip IR (pt tending to maintain RUE/LE in flexion synergy pattern) and RUE elevated on pillow; semi-sidelying toward L side (needs to be turned in 2 hours)) Nurse Communication: Mobility status;Need for lift equipment;Other (comment) (hoyer lift to chair vs Stedy) PT Visit Diagnosis: Other abnormalities of gait and mobility (R26.89);Hemiplegia and hemiparesis Hemiplegia - Right/Left: Right Hemiplegia - dominant/non-dominant: Dominant Hemiplegia - caused by: Cerebral infarction     Time: 1133-1224 PT Time Calculation (min) (ACUTE ONLY): 51 min  Charges:  $Therapeutic Exercise: 8-22 mins $Therapeutic Activity: 23-37 mins                     Darry Kelnhofer P., PTA Acute Rehabilitation Services Pager: [phone] Office: [phone]    Dorathy Kinsman Collier Bohnet 04/12/2021, 2:18 PM

## 2021-04-12 NOTE — Progress Notes (Signed)
Occupational Therapy Treatment Patient Details Name: Jade [name] MRN: [id number] DOB: 20-Dec-1955 Today's Date: 04/12/2021    History of present illness Pt is a 65 y.o. female who presented 04/01/21 with R-sided weakness. MRI reveals an embolic appearing stroke on the left, affecting a posterior MCA branch. Imaging revealed severe L ICA origin stenosis. Of note, a few weeks ago pt had stroke affecting her vision and R-hand strength, MRI then revealed acute L thalamic , anterior left frontal and possible right frontal and multiple remote lacunar infarcts. 8/11 pt s/p Left transcarotid artery revascularization (TCAR) with worsening right sided weakness immediately post-op with MRI evidence of expansion of previous left frontoparietal infarct. PMH: arthritis, CKD, COPD, CVA, fibromyalgia, gout, HTN, migraine, OSA, DM2.   OT comments  Pt agreeable to OT session today. She continues to require max A for all EOB balancing, bed mobility, and max-total A +2 for all OOB mobility. Pt was set up and repositioned in chair to provide safer positioning for self feeding. OT also completed PROM of RUE to ensure joint movement and loosen tone that had built up in pt's hand and elbow. OT will continue to follow acutely.    Follow Up Recommendations  CIR;Supervision/Assistance - 24 hour    Equipment Recommendations  Other (comment) (TBD)    Recommendations for Other Services Rehab consult    Precautions / Restrictions Precautions Precautions: Fall Precaution Comments: post CVA right hemiplegia, diplopia, SBP goal 130-160 per MD note Restrictions Weight Bearing Restrictions: No       Mobility Bed Mobility Overal bed mobility: Needs Assistance Bed Mobility: Supine to Sit   Sidelying to sit: Max assist;+2 for physical assistance;+2 for safety/equipment   Sit to supine: Max assist;+2 for physical assistance   General bed mobility comments: Pt neeeding assist for balance EOB and bringing BLE off the  bed as well as trunk elevation    Transfers Overall transfer level: Needs assistance Equipment used: 1 person hand held assist Transfers: Sit to/from [employer] Sit to Stand: Max assist Stand pivot transfers: Total assist       General transfer comment: assist to bring hips forward and pull up into Stedy with R UE supported. Pt with R lateral lean in standing and while seated in [place] requiring physical assist to maintain upright posture for transfer from chair to EOB via Stedy. Initially needed maxA +1 to stand to stedy from chair x2 reps but pt more fatigued with final stand from [facility] and needed +2 staff assist and x2 attempts to maintain upright stance and move Stedy flaps prior to sitting. (4 total stands). Pivot from chair to bed via Stedy with totalA.    Balance Overall balance assessment: Needs assistance Sitting-balance support: Single extremity supported;Feet supported Sitting balance-Leahy Scale: Poor (Poor to Zero) Sitting balance - Comments: Pt requiring max support to sit EOB Postural control: Right lateral lean;Posterior lean Standing balance support: Bilateral upper extremity supported Standing balance-Leahy Scale: Zero Standing balance comment: unable to fully stand                           ADL either performed or assessed with clinical judgement   ADL Overall ADL's : Needs assistance/impaired Eating/Feeding: Set up;Minimal assistance Eating/Feeding Details (indicate cue type and reason): Pt requires set up to min, due to needing to be repositioned in an upright seated position with pillows to reduce R sided lean Grooming: Wash/dry hands;Wash/dry face;Set up;Sitting Grooming Details (indicate cue type and  reason): Provided a washcloth in recliner, pt able to use L hand and wash her face and R hand                 Toilet Transfer: Maximal assistance;+2 for physical assistance;Stand-pivot;BSC Toilet Transfer Details (indicate cue  type and reason): simulated transferrring to recliner         Functional mobility during ADLs: Maximal assistance;+2 for physical assistance;+2 for safety/equipment General ADL Comments: Pt assisting with transfer to recliner to set up for breakfast so that she can self feed.     Vision       Perception     Praxis      Cognition Arousal/Alertness: Awake/alert Behavior During Therapy: WFL for tasks assessed/performed Overall Cognitive Status: Impaired/Different from baseline Area of Impairment: Memory;Problem solving;Following commands                     Memory: Decreased short-term memory Following Commands: Follows one step commands with increased time;Follows multi-step commands inconsistently     Problem Solving: Slow processing;Difficulty sequencing;Requires verbal cues;Requires tactile cues General Comments: Some word finding difficulty noted but with increased time and some assist from staff, able to mostly express her needs to therapist. Pt pleasantly cooperative.        Exercises Exercises: Other exercises General Exercises - Lower Extremity Ankle Circles/Pumps: PROM;Right;Supine;Seated;AROM;Left;20 reps (PROM on R, pt AROM on L) Quad Sets: AROM;Left;10 reps;Supine Gluteal Sets: AROM;10 reps;Supine Long Arc Quad: PROM;Right;Seated;AROM;20 reps (pt with reflexive RLE flexion at times but no notable muscle control when cued to attempt aside from flexion synergies; AROM on LLE) Hip ABduction/ADduction: PROM;Right;Supine;5 reps Hip Flexion/Marching: AROM;Left;5 reps;Seated Other Exercises Other Exercises: Opening and closing R hand - PROM x10 Other Exercises: Elbow flexion and extention, RUE, PROM x5 Other Exercises: Shoulder flexion, RUE, PROM, x10   Shoulder Instructions       General Comments VSS, set up and eating breakfast    Pertinent Vitals/ Pain       Pain Assessment: Faces Faces Pain Scale: Hurts even more Pain Location: R wrist when she  picks it up with LUE for repositioning (noted bruising there from previous IV site), RLE/RUE discomfort/cramps intermittently Pain Descriptors / Indicators: Grimacing;Discomfort;Cramping;Contraction Pain Intervention(s): Limited activity within patient's tolerance;Repositioned;Monitored during session  Home Living     Available Help at Discharge: Family;Available 24 hours/day (spouse works days, son, Aurther Loft, to provide 24/7 supervision)         Home Layout: One level           Bathroom Accessibility: Yes How Accessible: Accessible via walker            Prior Functioning/Environment          Comments: doesn't drive   Frequency  Min 2X/week        Progress Toward Goals  OT Goals(current goals can now be found in the care plan section)  Progress towards OT goals: Progressing toward goals  Acute Rehab OT Goals Patient Stated Goal: to be able to go to rehab and then home OT Goal Formulation: With patient Time For Goal Achievement: 04/16/21 Potential to Achieve Goals: Good ADL Goals Pt Will Perform Grooming: with min guard assist;standing Pt Will Perform Upper Body Bathing: with min guard assist;sitting Pt Will Perform Lower Body Bathing: with min guard assist;sit to/from stand Pt Will Perform Upper Body Dressing: with min guard assist;sitting Pt Will Perform Lower Body Dressing: with min guard assist;sit to/from stand Pt Will Transfer to Toilet: with min assist;ambulating;bedside  commode Pt Will Perform Toileting - Clothing Manipulation and hygiene: with min assist Pt/caregiver will Perform Home Exercise Program: Increased ROM;Increased strength;Right Upper extremity;With written HEP provided;With minimal assist Additional ADL Goal #1: Pt will be minguard A sit<>stand for basic ADLs Additional ADL Goal #2: Pt will be able to locate items on her right without cues Additional ADL Goal #3: Monitor for need for RUE resting hand splint  Plan Frequency remains  appropriate;Discharge plan remains appropriate    Co-evaluation                 AM-PAC OT "6 Clicks" Daily Activity     Outcome Measure   Help from another person eating meals?: A Little Help from another person taking care of personal grooming?: A Lot Help from another person toileting, which includes using toliet, bedpan, or urinal?: Total Help from another person bathing (including washing, rinsing, drying)?: A Lot Help from another person to put on and taking off regular upper body clothing?: A Lot Help from another person to put on and taking off regular lower body clothing?: A Lot 6 Click Score: 12    End of Session Equipment Utilized During Treatment: Gait belt  OT Visit Diagnosis: Other abnormalities of gait and mobility (R26.89);Muscle weakness (generalized) (M62.81);Low vision, both eyes (H54.2);Hemiplegia and hemiparesis Hemiplegia - Right/Left: Right Hemiplegia - dominant/non-dominant: Dominant Hemiplegia - caused by: Cerebral infarction   Activity Tolerance Patient tolerated treatment well   Patient Left in chair;with call bell/phone within reach;with chair alarm set   Nurse Communication Mobility status;Need for lift equipment        Time: 1011-1044 OT Time Calculation (min): 33 min  Charges: OT General Charges $OT Visit: 1 Visit OT Treatments $Self Care/Home Management : 23-37 mins  Nikolus Marczak H., OTR/L Acute Rehabilitation  [facility] [place]ume 04/12/2021, 5:35 PM

## 2021-04-13 ENCOUNTER — Inpatient Hospital Stay (HOSPITAL_COMMUNITY)
Admission: RE | Admit: 2021-04-13 | Discharge: 2021-05-12 | DRG: 057 | Disposition: A | Discharge status: Home Health | Payer: Medicare Other | Source: Intra-hospital | Attending: Physical Medicine & Rehabilitation | Admitting: Physical Medicine & Rehabilitation

## 2021-04-13 ENCOUNTER — Encounter (HOSPITAL_COMMUNITY): Payer: Self-pay | Admitting: Physical Medicine & Rehabilitation

## 2021-04-13 ENCOUNTER — Other Ambulatory Visit: Payer: Self-pay

## 2021-04-13 DIAGNOSIS — E876 Hypokalemia: Secondary | ICD-10-CM | POA: Diagnosis not present

## 2021-04-13 DIAGNOSIS — I129 Hypertensive chronic kidney disease with stage 1 through stage 4 chronic kidney disease, or unspecified chronic kidney disease: Secondary | ICD-10-CM | POA: Diagnosis present

## 2021-04-13 DIAGNOSIS — N1831 Chronic kidney disease, stage 3a: Secondary | ICD-10-CM | POA: Diagnosis present

## 2021-04-13 DIAGNOSIS — R252 Cramp and spasm: Secondary | ICD-10-CM | POA: Diagnosis not present

## 2021-04-13 DIAGNOSIS — G629 Polyneuropathy, unspecified: Secondary | ICD-10-CM | POA: Diagnosis present

## 2021-04-13 DIAGNOSIS — I6623 Occlusion and stenosis of bilateral posterior cerebral arteries: Secondary | ICD-10-CM

## 2021-04-13 DIAGNOSIS — R159 Full incontinence of feces: Secondary | ICD-10-CM | POA: Diagnosis not present

## 2021-04-13 DIAGNOSIS — E1169 Type 2 diabetes mellitus with other specified complication: Secondary | ICD-10-CM | POA: Diagnosis not present

## 2021-04-13 DIAGNOSIS — I6300 Cerebral infarction due to thrombosis of unspecified precerebral artery: Secondary | ICD-10-CM

## 2021-04-13 DIAGNOSIS — Z6831 Body mass index (BMI) 31.0-31.9, adult: Secondary | ICD-10-CM

## 2021-04-13 DIAGNOSIS — E1165 Type 2 diabetes mellitus with hyperglycemia: Secondary | ICD-10-CM | POA: Diagnosis not present

## 2021-04-13 DIAGNOSIS — K5901 Slow transit constipation: Secondary | ICD-10-CM | POA: Diagnosis not present

## 2021-04-13 DIAGNOSIS — G8191 Hemiplegia, unspecified affecting right dominant side: Secondary | ICD-10-CM

## 2021-04-13 DIAGNOSIS — G8110 Spastic hemiplegia affecting unspecified side: Secondary | ICD-10-CM | POA: Diagnosis not present

## 2021-04-13 DIAGNOSIS — R339 Retention of urine, unspecified: Secondary | ICD-10-CM | POA: Diagnosis not present

## 2021-04-13 DIAGNOSIS — K5900 Constipation, unspecified: Secondary | ICD-10-CM | POA: Diagnosis present

## 2021-04-13 DIAGNOSIS — G4733 Obstructive sleep apnea (adult) (pediatric): Secondary | ICD-10-CM | POA: Diagnosis present

## 2021-04-13 DIAGNOSIS — I639 Cerebral infarction, unspecified: Secondary | ICD-10-CM | POA: Diagnosis not present

## 2021-04-13 DIAGNOSIS — D6832 Hemorrhagic disorder due to extrinsic circulating anticoagulants: Secondary | ICD-10-CM | POA: Diagnosis not present

## 2021-04-13 DIAGNOSIS — K219 Gastro-esophageal reflux disease without esophagitis: Secondary | ICD-10-CM | POA: Diagnosis present

## 2021-04-13 DIAGNOSIS — Z91048 Other nonmedicinal substance allergy status: Secondary | ICD-10-CM

## 2021-04-13 DIAGNOSIS — Z8673 Personal history of transient ischemic attack (TIA), and cerebral infarction without residual deficits: Secondary | ICD-10-CM

## 2021-04-13 DIAGNOSIS — I10 Essential (primary) hypertension: Secondary | ICD-10-CM | POA: Diagnosis not present

## 2021-04-13 DIAGNOSIS — E1122 Type 2 diabetes mellitus with diabetic chronic kidney disease: Secondary | ICD-10-CM | POA: Diagnosis present

## 2021-04-13 DIAGNOSIS — J410 Simple chronic bronchitis: Secondary | ICD-10-CM

## 2021-04-13 DIAGNOSIS — Z8249 Family history of ischemic heart disease and other diseases of the circulatory system: Secondary | ICD-10-CM

## 2021-04-13 DIAGNOSIS — J449 Chronic obstructive pulmonary disease, unspecified: Secondary | ICD-10-CM | POA: Diagnosis present

## 2021-04-13 DIAGNOSIS — E785 Hyperlipidemia, unspecified: Secondary | ICD-10-CM | POA: Diagnosis present

## 2021-04-13 DIAGNOSIS — S92525D Nondisplaced fracture of medial phalanx of left lesser toe(s), subsequent encounter for fracture with routine healing: Secondary | ICD-10-CM

## 2021-04-13 DIAGNOSIS — Z87891 Personal history of nicotine dependence: Secondary | ICD-10-CM

## 2021-04-13 DIAGNOSIS — I69351 Hemiplegia and hemiparesis following cerebral infarction affecting right dominant side: Principal | ICD-10-CM

## 2021-04-13 DIAGNOSIS — M797 Fibromyalgia: Secondary | ICD-10-CM | POA: Diagnosis present

## 2021-04-13 DIAGNOSIS — T45515A Adverse effect of anticoagulants, initial encounter: Secondary | ICD-10-CM | POA: Diagnosis not present

## 2021-04-13 DIAGNOSIS — I63512 Cerebral infarction due to unspecified occlusion or stenosis of left middle cerebral artery: Secondary | ICD-10-CM | POA: Diagnosis not present

## 2021-04-13 DIAGNOSIS — I63039 Cerebral infarction due to thrombosis of unspecified carotid artery: Secondary | ICD-10-CM | POA: Diagnosis not present

## 2021-04-13 DIAGNOSIS — M1990 Unspecified osteoarthritis, unspecified site: Secondary | ICD-10-CM | POA: Diagnosis present

## 2021-04-13 DIAGNOSIS — Z79899 Other long term (current) drug therapy: Secondary | ICD-10-CM

## 2021-04-13 DIAGNOSIS — I7389 Other specified peripheral vascular diseases: Secondary | ICD-10-CM | POA: Diagnosis present

## 2021-04-13 DIAGNOSIS — Z7902 Long term (current) use of antithrombotics/antiplatelets: Secondary | ICD-10-CM

## 2021-04-13 DIAGNOSIS — M7989 Other specified soft tissue disorders: Secondary | ICD-10-CM

## 2021-04-13 DIAGNOSIS — M109 Gout, unspecified: Secondary | ICD-10-CM | POA: Diagnosis present

## 2021-04-13 DIAGNOSIS — Z9049 Acquired absence of other specified parts of digestive tract: Secondary | ICD-10-CM

## 2021-04-13 DIAGNOSIS — R7309 Other abnormal glucose: Secondary | ICD-10-CM

## 2021-04-13 DIAGNOSIS — E669 Obesity, unspecified: Secondary | ICD-10-CM

## 2021-04-13 LAB — BASIC METABOLIC PANEL
Anion gap: 11 (ref 5–15)
BUN: 27 mg/dL — ABNORMAL HIGH (ref 8–23)
CO2: 25 mmol/L (ref 22–32)
Calcium: 10.1 mg/dL (ref 8.9–10.3)
Chloride: 100 mmol/L (ref 98–111)
Creatinine, Ser: 1.49 mg/dL — ABNORMAL HIGH (ref 0.44–1.00)
GFR, Estimated: 39 mL/min — ABNORMAL LOW (ref >60)
Glucose, Bld: 143 mg/dL — ABNORMAL HIGH (ref 70–99)
Potassium: 4 mmol/L (ref 3.5–5.1)
Sodium: 136 mmol/L (ref 135–145)

## 2021-04-13 LAB — GLUCOSE, CAPILLARY
Glucose-Capillary: 159 mg/dL — ABNORMAL HIGH (ref 70–99)
Glucose-Capillary: 182 mg/dL — ABNORMAL HIGH (ref 70–99)
Glucose-Capillary: 177 mg/dL — ABNORMAL HIGH (ref 70–99)

## 2021-04-13 LAB — CBC
HCT: 40 % (ref 36.0–46.0)
Hemoglobin: 13.6 g/dL (ref 12.0–15.0)
MCH: 31.9 pg (ref 26.0–34.0)
MCHC: 34 g/dL (ref 30.0–36.0)
MCV: 93.7 fL (ref 80.0–100.0)
Platelets: 363 10*3/uL (ref 150–400)
RBC: 4.27 MIL/uL (ref 3.87–5.11)
RDW: 13.2 % (ref 11.5–15.5)
WBC: 13.8 10*3/uL — ABNORMAL HIGH (ref 4.0–10.5)
nRBC: 0 % (ref 0.0–0.2)

## 2021-04-13 MED ORDER — HYDROCHLOROTHIAZIDE 12.5 MG PO CAPS
12.5000 mg | ORAL_CAPSULE | Freq: Every day | ORAL | Status: DC
  Administered 2021-04-14 – 2021-04-29 (×16): 12.5 mg via ORAL
  Filled 2021-04-13 (×16): qty 1

## 2021-04-13 MED ORDER — DOCUSATE SODIUM 100 MG PO CAPS: 100.0000 mg | ORAL_CAPSULE | Freq: Two times a day (BID) | ORAL | Status: DC

## 2021-04-13 MED ORDER — INSULIN ASPART 100 UNIT/ML IJ SOLN
0.0000 [IU] | Freq: Three times a day (TID) | INTRAMUSCULAR | Status: DC
  Administered 2021-04-13: 2 [IU] via SUBCUTANEOUS
  Administered 2021-04-14: 1 [IU] via SUBCUTANEOUS
  Administered 2021-04-14 (×2): 2 [IU] via SUBCUTANEOUS
  Administered 2021-04-14: 1 [IU] via SUBCUTANEOUS
  Administered 2021-04-15 (×3): 2 [IU] via SUBCUTANEOUS
  Administered 2021-04-15: 3 [IU] via SUBCUTANEOUS
  Administered 2021-04-16: 2 [IU] via SUBCUTANEOUS
  Administered 2021-04-16: 1 [IU] via SUBCUTANEOUS
  Administered 2021-04-16 – 2021-04-17 (×3): 2 [IU] via SUBCUTANEOUS
  Administered 2021-04-17: 3 [IU] via SUBCUTANEOUS
  Administered 2021-04-17: 2 [IU] via SUBCUTANEOUS
  Administered 2021-04-18: 1 [IU] via SUBCUTANEOUS
  Administered 2021-04-18: 2 [IU] via SUBCUTANEOUS
  Administered 2021-04-18: 1 [IU] via SUBCUTANEOUS
  Administered 2021-04-18: 3 [IU] via SUBCUTANEOUS
  Administered 2021-04-19: 1 [IU] via SUBCUTANEOUS
  Administered 2021-04-19 – 2021-04-20 (×3): 2 [IU] via SUBCUTANEOUS
  Administered 2021-04-20 (×2): 1 [IU] via SUBCUTANEOUS
  Administered 2021-04-21: 3 [IU] via SUBCUTANEOUS
  Administered 2021-04-21: 1 [IU] via SUBCUTANEOUS
  Administered 2021-04-21 (×2): 2 [IU] via SUBCUTANEOUS
  Administered 2021-04-22 (×2): 3 [IU] via SUBCUTANEOUS
  Administered 2021-04-22 – 2021-04-23 (×2): 1 [IU] via SUBCUTANEOUS
  Administered 2021-04-23: 2 [IU] via SUBCUTANEOUS
  Administered 2021-04-23 (×2): 1 [IU] via SUBCUTANEOUS
  Administered 2021-04-24 (×3): 2 [IU] via SUBCUTANEOUS
  Administered 2021-04-25 (×2): 1 [IU] via SUBCUTANEOUS
  Administered 2021-04-25 – 2021-04-28 (×13): 2 [IU] via SUBCUTANEOUS
  Administered 2021-04-29: 1 [IU] via SUBCUTANEOUS
  Administered 2021-04-29: 2 [IU] via SUBCUTANEOUS
  Administered 2021-04-29: 3 [IU] via SUBCUTANEOUS
  Administered 2021-04-29: 1 [IU] via SUBCUTANEOUS
  Administered 2021-04-30: 3 [IU] via SUBCUTANEOUS
  Administered 2021-04-30: 2 [IU] via SUBCUTANEOUS
  Administered 2021-04-30 – 2021-05-01 (×2): 1 [IU] via SUBCUTANEOUS
  Administered 2021-05-02 (×2): 2 [IU] via SUBCUTANEOUS
  Administered 2021-05-03 (×3): 1 [IU] via SUBCUTANEOUS
  Administered 2021-05-04: 2 [IU] via SUBCUTANEOUS
  Administered 2021-05-04 (×2): 1 [IU] via SUBCUTANEOUS
  Administered 2021-05-05: 2 [IU] via SUBCUTANEOUS
  Administered 2021-05-05: 1 [IU] via SUBCUTANEOUS
  Administered 2021-05-05: 2 [IU] via SUBCUTANEOUS
  Administered 2021-05-06 – 2021-05-07 (×4): 1 [IU] via SUBCUTANEOUS
  Administered 2021-05-07: 2 [IU] via SUBCUTANEOUS
  Administered 2021-05-08 (×2): 1 [IU] via SUBCUTANEOUS
  Administered 2021-05-08 – 2021-05-09 (×2): 2 [IU] via SUBCUTANEOUS
  Administered 2021-05-09 – 2021-05-11 (×4): 1 [IU] via SUBCUTANEOUS

## 2021-04-13 MED ORDER — LIVING WELL WITH DIABETES BOOK
Freq: Once | Status: AC
  Filled 2021-04-13: qty 1

## 2021-04-13 MED ORDER — HEPARIN SODIUM (PORCINE) 5000 UNIT/ML IJ SOLN
5000.0000 [IU] | Freq: Three times a day (TID) | INTRAMUSCULAR | Status: DC
  Administered 2021-04-13 – 2021-04-20 (×20): 5000 [IU] via SUBCUTANEOUS
  Filled 2021-04-13 (×20): qty 1

## 2021-04-13 MED ORDER — ACETAMINOPHEN 160 MG/5ML PO SOLN: 650.0000 mg | ORAL | Status: DC | PRN

## 2021-04-13 MED ORDER — ACETAMINOPHEN 325 MG PO TABS: 650.0000 mg | ORAL_TABLET | Freq: Four times a day (QID) | ORAL | Status: DC | PRN

## 2021-04-13 MED ORDER — SENNOSIDES-DOCUSATE SODIUM 8.6-50 MG PO TABS: 1.0000 | ORAL_TABLET | Freq: Every evening | ORAL | Status: DC | PRN

## 2021-04-13 MED ORDER — ATORVASTATIN CALCIUM 80 MG PO TABS
80.0000 mg | ORAL_TABLET | Freq: Every day | ORAL | Status: DC
  Administered 2021-04-14 – 2021-05-12 (×29): 80 mg via ORAL
  Filled 2021-04-13 (×29): qty 1

## 2021-04-13 MED ORDER — MAGNESIUM OXIDE -MG SUPPLEMENT 400 (240 MG) MG PO TABS
400.0000 mg | ORAL_TABLET | Freq: Every day | ORAL | Status: DC
  Administered 2021-04-14 – 2021-05-12 (×29): 400 mg via ORAL
  Filled 2021-04-13 (×29): qty 1

## 2021-04-13 MED ORDER — ASPIRIN 325 MG PO TBEC: 325.0000 mg | DELAYED_RELEASE_TABLET | Freq: Every day | ORAL | 0 refills | Status: DC

## 2021-04-13 MED ORDER — GABAPENTIN 100 MG PO CAPS: 200.0000 mg | ORAL_CAPSULE | Freq: Every day | ORAL | Status: DC

## 2021-04-13 MED ORDER — DOCUSATE SODIUM 100 MG PO CAPS
100.0000 mg | ORAL_CAPSULE | Freq: Two times a day (BID) | ORAL | Status: DC
  Administered 2021-04-13 – 2021-04-21 (×15): 100 mg via ORAL
  Filled 2021-04-13 (×16): qty 1

## 2021-04-13 MED ORDER — ASPIRIN EC 325 MG PO TBEC
325.0000 mg | DELAYED_RELEASE_TABLET | Freq: Every day | ORAL | Status: DC
  Administered 2021-04-14 – 2021-05-12 (×29): 325 mg via ORAL
  Filled 2021-04-13 (×29): qty 1

## 2021-04-13 MED ORDER — CLOPIDOGREL BISULFATE 75 MG PO TABS
75.0000 mg | ORAL_TABLET | Freq: Every day | ORAL | Status: DC
  Administered 2021-04-14 – 2021-05-12 (×29): 75 mg via ORAL
  Filled 2021-04-13 (×29): qty 1

## 2021-04-13 MED ORDER — ACETAMINOPHEN 650 MG RE SUPP: 650.0000 mg | RECTAL | Status: DC | PRN

## 2021-04-13 MED ORDER — HEPARIN SODIUM (PORCINE) 5000 UNIT/ML IJ SOLN: 5000.0000 [IU] | Freq: Three times a day (TID) | INTRAMUSCULAR | Status: DC

## 2021-04-13 MED ORDER — PHENOL 1.4 % MT LIQD: 1.0000 | OROMUCOSAL | 0 refills | Status: DC | PRN

## 2021-04-13 MED ORDER — POLYETHYLENE GLYCOL 3350 17 G PO PACK: 17.0000 g | PACK | Freq: Every day | ORAL | 0 refills | Status: DC

## 2021-04-13 MED ORDER — AMLODIPINE BESYLATE 10 MG PO TABS
10.0000 mg | ORAL_TABLET | Freq: Every day | ORAL | Status: DC
  Administered 2021-04-14 – 2021-05-12 (×29): 10 mg via ORAL
  Filled 2021-04-13 (×29): qty 1

## 2021-04-13 MED ORDER — INSULIN ASPART 100 UNIT/ML IJ SOLN: 0.0000 [IU] | Freq: Three times a day (TID) | INTRAMUSCULAR | Status: DC

## 2021-04-13 MED ORDER — CYCLOBENZAPRINE HCL 10 MG PO TABS: 10.0000 mg | ORAL_TABLET | Freq: Three times a day (TID) | ORAL | 0 refills | Status: DC | PRN

## 2021-04-13 MED ORDER — PANTOPRAZOLE SODIUM 40 MG PO TBEC
40.0000 mg | DELAYED_RELEASE_TABLET | Freq: Every day | ORAL | Status: DC
Start: 2021-04-14 — End: 2021-04-27
  Administered 2021-04-14 – 2021-04-27 (×14): 40 mg via ORAL
  Filled 2021-04-13 (×14): qty 1

## 2021-04-13 MED ORDER — GABAPENTIN 100 MG PO CAPS
200.0000 mg | ORAL_CAPSULE | Freq: Every day | ORAL | Status: DC
  Filled 2021-04-13 (×6): qty 2

## 2021-04-13 MED ORDER — PANTOPRAZOLE SODIUM 40 MG PO TBEC: 40.0000 mg | DELAYED_RELEASE_TABLET | Freq: Every day | ORAL | Status: DC

## 2021-04-13 MED ORDER — OXYCODONE-ACETAMINOPHEN 5-325 MG PO TABS
1.0000 | ORAL_TABLET | ORAL | Status: DC | PRN
  Administered 2021-04-13 – 2021-04-27 (×10): 2 via ORAL
  Filled 2021-04-13 (×13): qty 2

## 2021-04-13 MED ORDER — ALUM & MAG HYDROXIDE-SIMETH 200-200-20 MG/5ML PO SUSP: 15.0000 mL | ORAL | 0 refills | Status: DC | PRN

## 2021-04-13 MED ORDER — GUAIFENESIN-DM 100-10 MG/5ML PO SYRP: 15.0000 mL | ORAL_SOLUTION | ORAL | 0 refills | Status: DC | PRN

## 2021-04-13 MED ORDER — ACETAMINOPHEN 325 MG PO TABS
650.0000 mg | ORAL_TABLET | ORAL | Status: DC | PRN
  Administered 2021-05-05 – 2021-05-09 (×7): 650 mg via ORAL
  Filled 2021-04-13 (×12): qty 2

## 2021-04-13 MED ORDER — POLYETHYLENE GLYCOL 3350 17 G PO PACK
17.0000 g | PACK | Freq: Every day | ORAL | Status: DC
  Administered 2021-04-14 – 2021-05-05 (×11): 17 g via ORAL
  Filled 2021-04-13 (×27): qty 1

## 2021-04-13 MED ORDER — OXYCODONE-ACETAMINOPHEN 5-325 MG PO TABS: 1.0000 | ORAL_TABLET | Freq: Four times a day (QID) | ORAL | 0 refills | Status: DC | PRN

## 2021-04-13 MED ORDER — SENNOSIDES-DOCUSATE SODIUM 8.6-50 MG PO TABS
1.0000 | ORAL_TABLET | Freq: Every evening | ORAL | Status: DC | PRN
  Administered 2021-04-15 – 2021-04-16 (×2): 1 via ORAL
  Filled 2021-04-13 (×2): qty 1

## 2021-04-13 NOTE — Progress Notes (Signed)
Progress Note    04/13/21 1400  PT Visit Information  Last PT Received On 04/13/21  Assistance Needed +2  History of Present Illness Pt is a 65 y.o. female who presented 04/01/21 with R-sided weakness. MRI reveals an embolic appearing stroke on the left, affecting a posterior MCA branch. Imaging revealed severe L ICA origin stenosis. Of note, a few weeks ago pt had stroke affecting her vision and R-hand strength, MRI then revealed acute L thalamic , anterior left frontal and possible right frontal and multiple remote lacunar infarcts. 8/11 pt s/p Left transcarotid artery revascularization (TCAR) with worsening right sided weakness immediately post-op with MRI evidence of expansion of previous left frontoparietal infarct. PMH: arthritis, CKD, COPD, CVA, fibromyalgia, gout, HTN, migraine, OSA, DM2.  Subjective Data  Patient Stated Goal to be able to go to rehab and then home  Precautions  Precautions Fall  Precaution Comments post CVA right hemiplegia, diplopia, SBP goal 130-160 per MD note  Pain Assessment  Pain Assessment Faces  Pain Score 0  Pain Intervention(s) Monitored during session  Cognition  Arousal/Alertness Awake/alert  Behavior During Therapy [facility] for tasks assessed/performed  Overall Cognitive Status Within Functional Limits for tasks assessed  Bed Mobility  Overal bed mobility Needs Assistance  Bed Mobility Sit to Supine  Sit to supine Max assist;+2 for physical assistance  General bed mobility comments assist and stability of trunk and bil LE's to sidelying with support of R UE.  Transfers  Overall transfer level Needs assistance  Transfers Sit to/from Stand;Stand Pivot Transfers  Sit to Stand Max assist;+2 physical assistance  Stand pivot transfers +2 physical assistance;Total assist  General transfer comment pt stood at [facility] with face to face assist for peri care before stand pivot to bed via the weaker side.  Pt needed assist to scoot back on bed x2 prior to transition to  supine.  Modified Rankin (Stroke Patients Only)  Modified Rankin 5  Balance  Overall balance assessment Needs assistance  Sitting balance-Leahy Scale Poor  Standing balance-Leahy Scale Zero  General Comments  General comments (skin integrity, edema, etc.) vss  PT - End of Session  Equipment Utilized During Treatment Gait belt  Activity Tolerance Patient tolerated treatment well  Patient left in bed;with call bell/phone within reach;with bed alarm set  Nurse Communication Mobility status   PT - Assessment/Plan  PT Plan Current plan remains appropriate  PT Visit Diagnosis Other abnormalities of gait and mobility (R26.89);Hemiplegia and hemiparesis  Hemiplegia - Right/Left Right  Hemiplegia - dominant/non-dominant Dominant  Hemiplegia - caused by Cerebral infarction  PT Frequency (ACUTE ONLY) Min 4X/week  Recommendations for Other Services Rehab consult  Follow Up Recommendations CIR;Supervision/Assistance - 24 hour  PT equipment Wheelchair (measurements PT);Wheelchair cushion (measurements PT)  AM-PAC PT "6 Clicks" Mobility Outcome Measure (Version 2)  Help needed turning from your back to your side while in a flat bed without using bedrails? 1  Help needed moving from lying on your back to sitting on the side of a flat bed without using bedrails? 1  Help needed moving to and from a bed to a chair (including a wheelchair)? 1  Help needed standing up from a chair using your arms (e.g., wheelchair or bedside chair)? 1  Help needed to walk in hospital room? 1  Help needed climbing 3-5 steps with a railing?  1  6 Click Score 6  Consider Recommendation of Discharge To: CIR/SNF/LTACH  Progressive Mobility  What is the highest level of mobility based on the  progressive mobility assessment? Level 2 (Chairfast) - Balance while sitting on edge of bed and cannot stand  Mobility Out of bed to chair with meals  PT Goal Progression  Progress towards PT goals Progressing toward goals  Acute  Rehab PT Goals  PT Goal Formulation With patient  Time For Goal Achievement 04/21/21  Potential to Achieve Goals Fair  PT Time Calculation  PT Start Time (ACUTE ONLY) 1307  PT Stop Time (ACUTE ONLY) 1316  PT Time Calculation (min) (ACUTE ONLY) 9 min  PT General Charges  $$ ACUTE PT VISIT 1 Visit  PT Treatments  $Therapeutic Activity 8-22 mins   04/13/2021  Jacinto Halim., PT Acute Rehabilitation Services [phone]  (pager) [phone]  (office)

## 2021-04-13 NOTE — Care Management Important Message (Signed)
Important Message  Patient Details  Name: Jade [name] MRN: [id number] Date of Birth: 11/08/1955   Medicare Important Message Given:  Yes     Renie Ora 04/13/2021, 11:38 AM

## 2021-04-13 NOTE — Discharge Summary (Signed)
Physician Discharge Summary  JESALYN FINAZZO TKZ:[id number] DOB: August 08, 1956 DOA: 04/01/2021  PCP: System, Provider Not In  Admit date: 04/01/2021 Discharge date: 04/13/2021  Admitted From: Home  Discharge disposition: CIR   Recommendations for Outpatient Follow-Up:   Follow up with your primary care provider after rehabilitation stay Follow-up with [facility] neurology Associates and vascular surgery as has been scheduled Check CBC, BMP, magnesium in the next visit Neurology recommended 325 mg of aspirin and Plavix for 3 months then Plavix alone.   Goal is to keep blood pressure between  SBP 130-160 as per neurology.   Discharge Diagnosis:   Principal Problem:   Acute CVA (cerebrovascular accident) ([place]) Active Problems:   Diabetes mellitus type 2 with neurological manifestations ([place])   Pure hypercholesterolemia   Essential hypertension, benign   Obesity, Class III, BMI 40-49.9 (morbid obesity) ([facility])   TIA (transient ischemic attack)   Carotid artery stenosis with cerebral infarction ([place])   COPD (chronic obstructive pulmonary disease) ([place])    Discharge Condition: Improved.  Diet recommendation: Low sodium, heart healthy.  Carbohydrate-modified.    Wound care: None.  Code status: DNR   History of Present Illness:   Jade [name] is a 65 y.o. female with a history of recent CVA, COPD, CKD, hyperlipidemia, hypertension, obesity, diabetes mellitus type 2 presented to hospital with numbness and weakness of the right hand and was noted to have acute left posterior MCA branch CVA likely secondary to severe left ICA stenosis.  Vascular surgery was consulted and patient underwent TCAR on 04/06/2021.   Hospital Course:   Following conditions were addressed during hospitalization as listed below,  Acute CVA History of recent CVA.  Patient did have left ICA stenosis more than 80%.  Neurology and vascular surgery were consulted.  Patient was continued on aspirin/Plavix,  statins status post TCAR on 04/06/2021.  Postoperatively patient had dense right upper extremity flaccid weakness.   MRI postoperatively showed increasing lateral left frontoparietal infarct.   Neurology recommended 325 mg of aspirin and Plavix for 3 months then Plavix alone.  Continue statins.  Vascular surgery and neurology to follow-up with the patient as outpatient.   Left lower extremity spasms, history of restless syndrome.  Improved after increasing Flexeril from 5 mg to 10 mg 3 times daily.  Neurontin on board as well.  Patient could not take Requip in the past.  Patient might need medications for restless leg syndrome on discharge from rehabilitation..  States that her magnesium at home helps her out.  Could use her home medications.   Left ICA stenosis S/p  TCAR on 04/06/2021.  Continue rehabilitation.  Follow-up with vascular surgery as outpatient.   Hyperlipidemia Continue statins.  Essential hypertension Continue amlodipine and hydrochlorothiazide as an outpatient.    Goal is to keep blood pressure between  SBP 130-160 as per neurology.  Could use as needed labetalol if needed.   Diabetes mellitus, type II with diabetic retinopathy Diet controlled at home.  Hemoglobin A1c was 6.5.  Patient follows with ophthalmology as an outpatient for diabetic retinopathy.  Continue diabetic diet, sliding scale insulin.     COPD compensated.   Obesity Body mass index is 32.12 kg/m.  Would benefit from weight loss as outpatient   Hypokalemia.  Replenished and improved.  Disposition.  At this time, patient is stable for disposition to CIR.  Medical Consultants:   Neurology Vascular surgery  Procedures:    TCAR on 04/06/2021   Subjective:   Today, patient was seen  and examined at bedside.  Patient denies any dizziness tiredness shortness of breath cough fever.  Still has right upper extremity weakness.  Discharge Exam:   Vitals:   04/13/21 0821 04/13/21 0854  BP: (!) 133/93 (!)  113/93  Pulse: 94 94  Resp: 18 18  Temp: 98.1 F (36.7 C) 98.1 F (36.7 C)  SpO2: 97% 97%   Vitals:   04/13/21 0000 04/13/21 0335 04/13/21 0821 04/13/21 0854  BP:  129/74 (!) 133/93 (!) 113/93  Pulse:  96 94 94  Resp: _0 Temp:  98.2 F (36.8 C) 98.1 F (36.7 C) 98.1 F (36.7 C)  TempSrc:  Oral Oral   SpO2:  95% 97% 97%  Weight:      Height:       Body mass index is 31.74 kg/m.  General: Alert awake, not in obvious distress, obese HENT: pupils equally reacting to light,  No scleral pallor or icterus noted. Oral mucosa is moist.  Chest:  Clear breath sounds.  Diminished breath sounds bilaterally. No crackles or wheezes.  CVS: S1 &S2 heard. No murmur.  Regular rate and rhythm. Abdomen: Soft, nontender, nondistended.  Bowel sounds are heard.   Extremities: No cyanosis, clubbing or edema.  Peripheral pulses are palpable. Psych: Alert, awake and oriented, normal mood CNS: Right upper extremity strength 0/5, right lower extremity weakness 2/5 Skin: Warm and dry.  No rashes noted.  The results of significant diagnostics from this hospitalization (including imaging, microbiology, ancillary and laboratory) are listed below for reference.     Diagnostic Studies:   CT ANGIO HEAD NECK W WO CM  Result Date: 04/02/2021 CLINICAL DATA:  Stroke workup EXAM: CT ANGIOGRAPHY HEAD AND NECK TECHNIQUE: Multidetector CT imaging of the head and neck was performed using the standard protocol during bolus administration of intravenous contrast. Multiplanar CT image reconstructions and MIPs were obtained to evaluate the vascular anatomy. Carotid stenosis measurements (when applicable) are obtained utilizing NASCET criteria, using the distal internal carotid diameter as the denominator. CONTRAST:  145m OMNIPAQUE IOHEXOL 350 MG/ML SOLN COMPARISON:  Brain MRI from yesterday FINDINGS: CTA NECK FINDINGS Aortic arch: Atheromatous plaque.  Three vessel branching. Right carotid system: Moderately  tortuous vessels. Calcified plaque at the ICA bulb causing 40% stenosis. No ulceration. Left carotid system: Calcified plaque at the bifurcation with high-grade stenosis at the ICA origin, at least 80%. No dissection or ulceration. Vertebral arteries: Proximal subclavian atherosclerosis without flow limiting stenosis or ulceration. Right V1 atheromatous plaque without significant stenosis. Skeleton: Bridging thoracic syndesmophytes. Other neck: No acute finding. Upper chest: No acute finding Review of the MIP images confirms the above findings CTA HEAD FINDINGS Anterior circulation: Atheromatous plaque on the carotid siphons. Highly attenuated left M3 branch. Suspect diffuse mild atheromatous change to medium size branches. No proximal branch occlusion or beading. Negative for aneurysm Posterior circulation: V4 segment atherosclerosis on the right. No flow limiting stenosis of vertebral and basilar arteries. High-grade bilateral P2 to P3 segment stenosis. Negative for aneurysm Venous sinuses: Unremarkable in the arterial phase. Anatomic variants: None significant Review of the MIP images confirms the above findings IMPRESSION: 1. Severe left ICA origin stenosis due to calcified plaque, over 80%. 2. 40% narrowing at the right ICA origin. 3. Intracranial atherosclerosis most notably causing advanced bilateral PCA stenosis. 4. High-grade high-grade left M3 branch stenosis which could be related to atherosclerosis or recent embolic disease. Electronically Signed   By: JMonte FantasiaM.D.   On: 04/02/2021 06:34   CT  HEAD WO CONTRAST  Result Date: 04/01/2021 CLINICAL DATA:  Right side numbness EXAM: CT HEAD WITHOUT CONTRAST TECHNIQUE: Contiguous axial images were obtained from the base of the skull through the vertex without intravenous contrast. COMPARISON:  03/06/2021 FINDINGS: Brain: There is atrophy and chronic small vessel disease changes. No acute intracranial abnormality. Specifically, no hemorrhage,  hydrocephalus, mass lesion, acute infarction, or significant intracranial injury. Vascular: No hyperdense vessel or unexpected calcification. Skull: No acute calvarial abnormality. Sinuses/Orbits: No acute findings Other: None IMPRESSION: Atrophy, chronic microvascular disease. No acute intracranial abnormality. Electronically Signed   By: Rolm Baptise M.D.   On: 04/01/2021 21:50   MR BRAIN WO CONTRAST  Result Date: 04/01/2021 CLINICAL DATA:  Right-sided weakness EXAM: MRI HEAD WITHOUT CONTRAST TECHNIQUE: Multiplanar, multiecho pulse sequences of the brain and surrounding structures were obtained without intravenous contrast. COMPARISON:  None. FINDINGS: Brain: Small acute infarct at the left frontoparietal junction. Additional small focus acute ischemia adjacent to the frontal horn of the left lateral ventricle with associated magnetic susceptibility effect, possibly petechial hemorrhage. Right middle cranial fossa arachnoid cyst. There is multifocal hyperintense T2-weighted signal within the white matter. Generalized volume loss without a clear lobar predilection. Multiple old small vessel infarcts of the gray nuclei. Vascular: Major flow voids are preserved. Skull and upper cervical spine: Normal calvarium and skull base. Visualized upper cervical spine and soft tissues are normal. Sinuses/Orbits:No paranasal sinus fluid levels or advanced mucosal thickening. No mastoid or middle ear effusion. Normal orbits. IMPRESSION: 1. Small acute infarct at the left frontoparietal junction. Additional small focus of acute ischemia adjacent to the frontal horn of the left lateral ventricle with petechial hemorrhage. 2. Multiple old small vessel infarcts and findings of chronic ischemic microangiopathy. Electronically Signed   By: Ulyses Jarred M.D.   On: 04/01/2021 23:43   DG CHEST PORT 1 VIEW  Result Date: 04/02/2021 CLINICAL DATA:  Right-sided numbness and history of prior strokes EXAM: PORTABLE CHEST 1 VIEW  COMPARISON:  03/05/2021 FINDINGS: Cardiac shadow is stable. Aortic calcifications are again seen. Patient is rotated to the right accentuating the mediastinal markings. Lungs are clear. No bony abnormality is noted. IMPRESSION: No active disease. Electronically Signed   By: Inez Catalina M.D.   On: 04/02/2021 01:00     Labs:   Basic Metabolic Panel: Recent Labs  Lab 04/07/21 0500 04/09/21 0014 04/11/21 0131 04/12/21 0053 04/13/21 0205  NA 138 140 138 136 136  K 4.0 3.2* 4.4 4.2 4.0  CL 107 104 106 104 100  CO2 _0 GLUCOSE 131* 130* 149* 127* 143*  BUN _1 27*  CREATININE 1.13* 1.20* 1.34* 1.19* 1.49*  CALCIUM 9.2 9.5 9.3 9.8 10.1  MG 2.3 2.2 1.8  --   --   PHOS 4.9*  --  3.0  --   --    GFR Estimated Creatinine Clearance: 34.7 mL/min (A) (by C-G formula based on SCr of 1.49 mg/dL (H)). Liver Function Tests: No results for input(s): AST, ALT, ALKPHOS, BILITOT, PROT, ALBUMIN in the last 168 hours. No results for input(s): LIPASE, AMYLASE in the last 168 hours. No results for input(s): AMMONIA in the last 168 hours. Coagulation profile No results for input(s): INR, PROTIME in the last 168 hours.  CBC: Recent Labs  Lab 04/07/21 0500 04/09/21 0014 04/11/21 0200 04/13/21 0205  WBC 11.5* 11.3* 11.3* 13.8*  HGB 12.3 12.8 12.9 13.6  HCT 36.7 37.6 37.5 40.0  MCV 95.1 95.2 94.7 93.7  PLT 312 289 316 363   Cardiac Enzymes: No results for input(s): CKTOTAL, CKMB, CKMBINDEX, TROPONINI in the last 168 hours. BNP: Invalid input(s): POCBNP CBG: Recent Labs  Lab 04/12/21 0617 04/12/21 1246 04/12/21 1643 04/12/21 2008 04/13/21 0605  GLUCAP 154* 179* 230* 184* 159*   D-Dimer No results for input(s): DDIMER in the last 72 hours. Hgb A1c No results for input(s): HGBA1C in the last 72 hours. Lipid Profile No results for input(s): CHOL, HDL, LDLCALC, TRIG, CHOLHDL, LDLDIRECT in the last 72 hours. Thyroid function studies No results for input(s): TSH,  T4TOTAL, T3FREE, THYROIDAB in the last 72 hours.  Invalid input(s): FREET3 Anemia work up No results for input(s): VITAMINB12, FOLATE, FERRITIN, TIBC, IRON, RETICCTPCT in the last 72 hours. Microbiology Recent Results (from the past 240 hour(s))  Surgical pcr screen     Status: None   Collection Time: 04/06/21 10:01 AM   Specimen: Nasal Mucosa; Nasal Swab  Result Value Ref Range Status   MRSA, PCR NEGATIVE NEGATIVE Final   Staphylococcus aureus NEGATIVE NEGATIVE Final    Comment: (NOTE) The Xpert SA Assay (FDA approved for NASAL specimens in patients 54 years of age and older), is one component of a comprehensive surveillance program. It is not intended to diagnose infection nor to guide or monitor treatment. Performed at [facility], [place] [address] [place], [place] [address]      Discharge Instructions:   Discharge Instructions     Diet - low sodium heart healthy   Complete by: As directed    Discharge instructions   Complete by: As directed    Follow-up with your primary care physician as outpatient after rehab stay. 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   alum & mag hydroxide-simeth 200-200-20 MG/5ML suspension Commonly known as: MAALOX/MYLANTA Take 15-30 mLs by mouth every 2 (two) hours as needed for  indigestion.   amLODipine 10 MG tablet Commonly known as: NORVASC Take 1 tablet (10 mg total) by mouth daily.   aspirin 325 MG EC tablet Take 1 tablet (325 mg total) by mouth daily. Start taking on: April 14, 2021   atorvastatin 80 MG tablet Commonly known as: LIPITOR Take 1 tablet (80 mg total) by mouth daily.   b complex vitamins capsule Take 1 capsule by mouth daily.   clopidogrel 75 MG tablet Commonly known as: PLAVIX Take 1 tablet (75 mg total) by mouth daily.   CoQ-10 100 MG Caps Take 100 mg by mouth at bedtime.   cyclobenzaprine 10 MG tablet Commonly known as: FLEXERIL Take 1 tablet (10 mg total) by mouth 3 (three) times daily as needed for muscle spasms.   docusate sodium 100 MG capsule Commonly known  as: COLACE Take 1 capsule (100 mg total) by mouth 2 (two) times daily.   Fish Oil 600 MG Caps Take 600 mg by mouth daily.   gabapentin 100 MG capsule Commonly known as: NEURONTIN Take 2 capsules (200 mg total) by mouth at bedtime.   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   hydrochlorothiazide 12.5 MG capsule Commonly known as: Microzide Take 1 capsule (12.5 mg total) by mouth daily.   insulin aspart 100 UNIT/ML injection Commonly known as: novoLOG Inject 0-9 Units into the skin 4 (four) times daily -  before meals and at bedtime.   magnesium gluconate 500 MG tablet Commonly known as: MAGONATE Take 1,000 mg by mouth at bedtime.   oxyCODONE-acetaminophen 5-325 MG tablet Commonly known as: PERCOCET/ROXICET Take 1-2 tablets by mouth every 6 (six) hours as needed for moderate pain or severe pain.   pantoprazole 40 MG tablet Commonly known as: PROTONIX Take 1 tablet (40 mg total) by mouth daily. Start taking on: April 14, 2021   phenol 1.4 %  Liqd Commonly known as: CHLORASEPTIC Use as directed 1 spray in the mouth or throat as needed for throat irritation / pain.   polyethylene glycol 17 g packet Commonly known as: MIRALAX / GLYCOLAX Take 17 g by mouth daily. Start taking on: April 14, 2021   senna-docusate 8.6-50 MG tablet Commonly known as: Senokot-S Take 1 tablet by mouth at bedtime as needed for moderate constipation.   Vitamin D-3 125 MCG (5000 UT) Tabs Take 5,000 Units by mouth daily.   VITAMIN K2 PO Take 1 tablet by mouth daily.        Follow-up Information     Garvin Fila, MD. Go on 06/14/2021.   Specialties: Neurology, Radiology Why: stroke clinic Contact information: [place] [address] [phone]         Vascular and [place] Follow up in 1 month(s).   Specialty: Vascular Surgery Why: The office will call the patient with an appointment Contact information: [place] [phone]                 Time coordinating discharge: 39 minutes  Signed:  Ramonte Mena  [facility] 04/13/2021, 10:46 AM

## 2021-04-13 NOTE — Discharge Instructions (Addendum)
Inpatient Rehab Discharge Instructions  Jade [name] [facility] Discharge date and time: No discharge date for patient encounter.   Activities/Precautions/ Functional Status: Activity: As tolerated Diet: Carb modified Wound Care: Routine skin checks Functional status:  ___ No restrictions     ___ Walk up steps independently ___ 24/7 supervision/assistance   ___ Walk up steps with assistance ___ Intermittent supervision/assistance  _x__ Bathe/dress independently ___ Walk with walker     ___ Bathe/dress with assistance ___ Walk Independently    ___ Shower independently ___ Walk with assistance    ___ Shower with assistance ___ No alcohol     ___ Return to work/school ________  COMMUNITY REFERRALS UPON DISCHARGE:    Home Health:   PT     OT                    Agency: [facility] Phone: [phone]   Medical Equipment/Items Ordered: Scientist, clinical (histocompatibility and immunogenetics) , Drop Arm Commode                                                  Agency/Supplier: [employer]    Special Instructions: No driving smoking or alcohol  Continue aspirin 325 mg daily and Plavix 75 mg daily x3 months then Plavix alone   My questions have been answered and I understand these instructions. I will adhere to these goals and the provided educational materials after my discharge from the hospital.  Patient/Caregiver Signature _______________________________ Date __________  Clinician Signature _______________________________________ Date __________  Please bring this form and your medication list with you to all your follow-up doctor's appointments.  STROKE/TIA DISCHARGE INSTRUCTIONS SMOKING Cigarette smoking nearly doubles your risk of having a stroke & is the single most alterable risk factor  If you smoke or have smoked in the last 12 months, you are advised to quit smoking for your health. Most of the excess cardiovascular risk related to smoking disappears within a year of stopping. Ask you doctor about  anti-smoking medications [place] Quit Line: 1-800-QUIT NOW Free Smoking Cessation Classes [phone]  CHOLESTEROL Know your levels; limit fat & cholesterol in your diet  Lipid Panel     Component Value Date/Time   CHOL 138 04/07/2021 0500   TRIG 138 04/07/2021 0500   HDL 46 04/07/2021 0500   CHOLHDL 3.0 04/07/2021 0500   VLDL 28 04/07/2021 0500   LDLCALC 64 04/07/2021 0500     Many patients benefit from treatment even if their cholesterol is at goal. Goal: Total Cholesterol (CHOL) less than 160 Goal:  Triglycerides (TRIG) less than 150 Goal:  HDL greater than 40 Goal:  LDL (LDLCALC) less than 100   BLOOD PRESSURE American Stroke Association blood pressure target is less that 120/80 mm/Hg  Your discharge blood pressure is:  BP: (!) 148/79 Monitor your blood pressure Limit your salt and alcohol intake Many individuals will require more than one medication for high blood pressure  DIABETES (A1c is a blood sugar average for last 3 months) Goal HGBA1c is under 7% (HBGA1c is blood sugar average for last 3 months)  Diabetes:    Lab Results  Component Value Date   HGBA1C 6.5 (H) 04/01/2021    Your HGBA1c can be lowered with medications, healthy diet, and exercise. Check your blood sugar as directed by your physician Call your physician if you experience unexplained  or low blood sugars.  PHYSICAL ACTIVITY/REHABILITATION Goal is 30 minutes at least 4 days per week  Activity: Increase activity slowly, Therapies: Physical Therapy: Home Health Return to work:  Activity decreases your risk of heart attack and stroke and makes your heart stronger.  It helps control your weight and blood pressure; helps you relax and can improve your mood. Participate in a regular exercise program. Talk with your doctor about the best form of exercise for you (dancing, walking, swimming, cycling).  DIET/WEIGHT Goal is to maintain a healthy weight  Your discharge diet is:  Diet Order             Diet  heart healthy/carb modified Room service appropriate? Yes with Assist; Fluid consistency: Thin  Diet effective now                   liquids Your height is:  Height: 5\' 1"  (154.9 cm) Your current weight is: Weight: 76.2 kg Your Body Mass Index (BMI) is:  BMI (Calculated): 31.76 Following the type of diet specifically designed for you will help prevent another stroke. Your goal weight range is:   Your goal Body Mass Index (BMI) is 19-24. Healthy food habits can help reduce 3 risk factors for stroke:  High cholesterol, hypertension, and excess weight.  RESOURCES Stroke/Support Group:  Call [phone]   STROKE EDUCATION PROVIDED/REVIEWED AND GIVEN TO PATIENT Stroke warning signs and symptoms How to activate emergency medical system (call 911). Medications prescribed at discharge. Need for follow-up after discharge. Personal risk factors for stroke. Pneumonia vaccine given: No Flu vaccine given: No My questions have been answered, the writing is legible, and I understand these instructions.  I will adhere to these goals & educational materials that have been provided to me after my discharge from the hospital.

## 2021-04-13 NOTE — Progress Notes (Signed)
Meredith Staggers, MD   Physician  Physical Medicine and Rehabilitation  PMR Pre-admission     Signed  Date of Service:  04/12/2021  3:43 PM       Related encounter: ED to Hosp-Admission (Current) from 04/01/2021 in [facility] 4E CV SURGICAL PROGRESSIVE CARE       Signed      Show:Clear all _0 Written_1 Templated_2 Copied  Added by: _3 Cristina Gong, RN_4 Meredith Staggers, MD  _5 [place] for details                                                                                                                                                                                                                                                                                                                                                                                                                                                                PMR Admission Coordinator Pre-Admission Assessment   Patient: Jade [name] is an 65 y.o., female MRN: [id number] DOB: [birth year] Height: _6  (154.9 cm) Weight: 76.2 kg   Insurance Information HMO: yes    PPO:      PCP:      IPA:      80/20:      OTHER:  PRIMARY: [place] Dual Complete  Policy#: [id number]      Subscriber: pt CM Name: Wilburn Cornelia      Phone#: [phone] option #7     Fax#: [phone] Pre-Cert#: [id number] approved for 7 days      Employer:  Benefits:  Phone #: [phone]     Name: 8/16 Eff. Date: 08/27/2020     Deduct: none      Out of Pocket Max: $7550      Life Max: none CIR: $1480 per admission with dual Medicaid coverage      SNF: no copay ment for days 1 until 20; $194.50 cop ay per day days 21 until 100 Outpatient: 100%     Co-Pay:  Home Health: 100%      Co-Pay: visits per medical neccesity DME: 80%     Co-Pay: 20% Providers: in network  SECONDARY   Medicaid of  [place] Policy#: [id number] Currently inactive on 8/18 per Medicaid passport one source. Patient made aware on 8/18     Financial Counselor:       Phone#:    The "Data Collection Information Summary" for patients in Inpatient Rehabilitation Facilities with attached "Privacy Act [place] Records" was provided and verbally reviewed with: Patient   Emergency Contact Information Contact Information       Name Relation Home Work Mobile    Campanella,Jose Spouse   [phone]    Teer, Arsenio Loader     [phone]           Current Medical History  Patient Admitting Diagnosis: CVA   History of Present Illness:  65 year old right-handed female with history significant for CVA, COPD, CKD stage III hypertension, hypertension, OSA, diabetes mellitus.  Recent admission 03/05/2021 to 03/14/2021 for CVA/left inferior thalamic maintained on aspirin and Plavix maintained on aspirin and Plavix.  Per chart review patient lives with spouse.  Modified independent with rolling walker.  1 level home 4 steps to entry.  Presented 04/01/2021 with right side weakness.  CT/MRI showed small acute infarcts in the left frontal parietal junction.  Additional small focus of acute ischemia adjacent to the frontal horn of the left lateral ventricle with petechial hemorrhage.  Multiple old small vessel infarct and findings of chronic ischemic microangiopathy.  CT angiogram of head and neck severe left ICA origin stenosis due to calcified plaque, over 80%.  40% narrowing at the right ICA origin.  High-grade left M3 branch stenosis which could be related to atherosclerosis or recent embolic disease.  Admission chemistries unremarkable except potassium 3.3, creatinine 0.96, urine drug screen negative.  Echocardiogram of 03/06/2021 showed ejection fraction of 55 to 73% grade 1 diastolic dysfunction.  Vascular surgery follow-up in regards to severe left carotid artery stenosis underwent left transcarotid artery  revascularization 04/06/2021 per Dr. Stanford Breed.  Currently maintained on full-strength aspirin 325 mg daily as well as Plavix for CVA prophylaxis.  Subcutaneous heparin for DVT prophylaxis x3 months then Plavix alone.  Tolerating a regular consistency diet.    Complete NIHSS TOTAL: 13   Patient's medical record from [facility]  has been reviewed by the rehabilitation admission coordinator and physician.   Past Medical History      Past Medical History:  Diagnosis Date   Arthritis      "back, knees, some in my left hip" (03/21/2018)   Chronic kidney disease      "was told I had 25% kidney function in early 2012" (03/21/2018)   COPD (chronic obstructive pulmonary disease) (HCC)     CVA (cerebral vascular  accident) ([place]) 03/21/2018    "numb all over; head to toe" (03/21/2018)   Fibromyalgia     History of gout     Hyperlipidemia     Hypertension     Migraine      "used to get them monthly during menopause" (03/21/2018)   Neuromuscular disorder ([place])     OSA (obstructive sleep apnea)      "don't tolerate the machine" (03/21/2018)   Pneumonia ~ 2007   Type 2 diabetes, diet controlled ([place])        Family History   family history includes Alcohol abuse in her son; Heart disease in her father; Leukemia in her father; Other in her mother.   Prior Rehab/Hospitalizations Has the patient had prior rehab or hospitalizations prior to admission? Yes   Has the patient had major surgery during 100 days prior to admission? Yes              Current Medications   Current Facility-Administered Medications:    acetaminophen (TYLENOL) tablet 650 mg, 650 mg, Oral, Q4H PRN **OR** acetaminophen (TYLENOL) 160 MG/5ML solution 650 mg, 650 mg, Per Tube, Q4H PRN **OR** acetaminophen (TYLENOL) suppository 650 mg, 650 mg, Rectal, Q4H PRN, Baglia, Corrina, PA-C   alum & mag hydroxide-simeth (MAALOX/MYLANTA) 200-200-20 MG/5ML suspension 15-30 mL, 15-30 mL, Oral, Q2H PRN, Baglia, Corrina, PA-C   amLODipine  (NORVASC) tablet 10 mg, 10 mg, Oral, Daily, Pokhrel, Laxman, MD, 10 mg at 04/13/21 0900   aspirin EC tablet 325 mg, 325 mg, Oral, Daily, [name], MD, 325 mg at 04/13/21 0900   atorvastatin (LIPITOR) tablet 80 mg, 80 mg, Oral, Daily, Baglia, Corrina, PA-C, 80 mg at 04/13/21 0901   clopidogrel (PLAVIX) tablet 75 mg, 75 mg, Oral, Daily, Baglia, Corrina, PA-C, 75 mg at 04/13/21 0900   cyclobenzaprine (FLEXERIL) tablet 10 mg, 10 mg, Oral, TID PRN, Pokhrel, Laxman, MD, 10 mg at 04/11/21 1822   docusate sodium (COLACE) capsule 100 mg, 100 mg, Oral, BID, Pokhrel, Laxman, MD, 100 mg at 04/13/21 0900   gabapentin (NEURONTIN) capsule 200 mg, 200 mg, Oral, QHS, Pokhrel, Laxman, MD, 200 mg at 04/12/21 2111   guaiFENesin-dextromethorphan (ROBITUSSIN DM) 100-10 MG/5ML syrup 15 mL, 15 mL, Oral, Q4H PRN, Baglia, Corrina, PA-C   heparin injection 5,000 Units, 5,000 Units, Subcutaneous, Q8H, Baglia, Corrina, PA-C, 5,000 Units at 04/13/21 4765   hydrALAZINE (APRESOLINE) injection 10 mg, 10 mg, Intravenous, Q4H PRN, Pokhrel, Laxman, MD, 10 mg at 04/11/21 1606   hydrochlorothiazide (MICROZIDE) capsule 12.5 mg, 12.5 mg, Oral, Daily, Pokhrel, Laxman, MD, 12.5 mg at 04/13/21 0900   insulin aspart (novoLOG) injection 0-9 Units, 0-9 Units, Subcutaneous, TID AC & HS, Pokhrel, Laxman, MD, 2 Units at 04/13/21 0630   labetalol (NORMODYNE) injection 10 mg, 10 mg, Intravenous, Q2H PRN, Pokhrel, Laxman, MD, 10 mg at 04/11/21 1129   magnesium oxide (MAG-OX) tablet 400 mg, 400 mg, Oral, Daily, Cherre Robins, MD, 400 mg at 04/13/21 0901   magnesium sulfate IVPB 2 g 50 mL, 2 g, Intravenous, Daily PRN, Baglia, Corrina, PA-C   metoprolol tartrate (LOPRESSOR) injection 2-5 mg, 2-5 mg, Intravenous, Q2H PRN, Baglia, Corrina, PA-C   morphine 2 MG/ML injection 2-5 mg, 2-5 mg, Intravenous, Q1H PRN, Baglia, Corrina, PA-C   ondansetron (ZOFRAN) injection 4 mg, 4 mg, Intravenous, Q6H PRN, Baglia, Corrina, PA-C   oxyCODONE-acetaminophen  (PERCOCET/ROXICET) 5-325 MG per tablet 1-2 tablet, 1-2 tablet, Oral, Q4H PRN, Baglia, Corrina, PA-C, 1 tablet at 04/11/21 2102   pantoprazole (PROTONIX) EC tablet  40 mg, 40 mg, Oral, Daily, Baglia, Corrina, PA-C, 40 mg at 04/13/21 0901   phenol (CHLORASEPTIC) mouth spray 1 spray, 1 spray, Mouth/Throat, PRN, Baglia, Corrina, PA-C   polyethylene glycol (MIRALAX / GLYCOLAX) packet 17 g, 17 g, Oral, Daily, Pokhrel, Laxman, MD, 17 g at 04/13/21 0901   senna-docusate (Senokot-S) tablet 1 tablet, 1 tablet, Oral, QHS PRN, Baglia, Corrina, PA-C   Patients Current Diet:  Diet Order                  Diet - low sodium heart healthy             Diet heart healthy/carb modified Room service appropriate? Yes with Assist; Fluid consistency: Thin  Diet effective now                         Precautions / Restrictions Precautions Precautions: Fall Precaution Comments: post CVA right hemiplegia, diplopia, SBP goal 130-160 per MD note Restrictions Weight Bearing Restrictions: No    Has the patient had 2 or more falls or a fall with injury in the past year? Yes   Prior Activity Level Limited Community (1-2x/wk): Mod I with RW, did not drive   Prior Functional Level Self Care: Did the patient need help bathing, dressing, using the toilet or eating? Needed some help   Indoor Mobility: Did the patient need assistance with walking from room to room (with or without device)? Needed some help   Stairs: Did the patient need assistance with internal or external stairs (with or without device)? Needed some help   Functional Cognition: Did the patient need help planning regular tasks such as shopping or remembering to take medications? Independent   Home Assistive Devices / Equipment Home Assistive Devices/Equipment: None Home Equipment: Walker - 2 wheels, Bedside commode, Other (comment) (hurry cane)   Prior Device Use: Indicate devices/aids used by the patient prior to current illness, exacerbation  or injury? Walker   Current Functional Level Cognition   Arousal/Alertness: Awake/alert Overall Cognitive Status: Impaired/Different from baseline Orientation Level: Oriented X4 Following Commands: Follows one step commands with increased time, Follows multi-step commands inconsistently General Comments: Some word finding difficulty noted but with increased time and some assist from staff, able to mostly express her needs to therapist. Pt pleasantly cooperative.    Extremity Assessment (includes Sensation/Coordination)   Upper Extremity Assessment: RUE deficits/detail RUE Deficits / Details: Unable to maintain position of RUE in space against gravity; unable to make a fist. Can adduct UE toward midline (non fluid movement). Felt some muscle activation in forearm when stretching UE out towards Stedy (unsure if resistance, spasticity, etc) RUE Sensation: decreased light touch RUE Coordination: decreased fine motor, decreased gross motor  Lower Extremity Assessment: Defer to PT evaluation RLE Deficits / Details: Generalized weakness noted; apraxia noted RLE Coordination: decreased fine motor, decreased gross motor     ADLs   Overall ADL's : Needs assistance/impaired Eating/Feeding: Set up, Minimal assistance Eating/Feeding Details (indicate cue type and reason): Pt requires set up to min, due to needing to be repositioned in an upright seated position with pillows to reduce R sided lean Grooming: Wash/dry hands, Wash/dry face, Set up, Sitting Grooming Details (indicate cue type and reason): Provided a washcloth in recliner, pt able to use L hand and wash her face and R hand Upper Body Bathing: Moderate assistance Upper Body Bathing Details (indicate cue type and reason): supported sitting Lower Body Bathing: Total assistance Lower Body Bathing Details (  indicate cue type and reason): Max A+2 sit<>stand Upper Body Dressing : Maximal assistance Upper Body Dressing Details (indicate cue type  and reason): supported sitting Lower Body Dressing: Maximal assistance, Bed level Lower Body Dressing Details (indicate cue type and reason): Patient able to lift BLE as therapist assisted with threading legs into mesh panties. Patient then able to bridge in supine with assist to hike over hips. Additional assist required to maintain flexed position of RLE. Toilet Transfer: Maximal assistance, +2 for physical assistance, Stand-pivot, BSC Toilet Transfer Details (indicate cue type and reason): simulated transferrring to recliner Toileting- Clothing Manipulation and Hygiene: Total assistance Toileting - Clothing Manipulation Details (indicate cue type and reason): Max A+2 sit<>stand Functional mobility during ADLs: Maximal assistance, +2 for physical assistance, +2 for safety/equipment General ADL Comments: Pt assisting with transfer to recliner to set up for breakfast so that she can self feed.     Mobility   Overal bed mobility: Needs Assistance Bed Mobility: Supine to Sit Rolling: Max assist Sidelying to sit: Max assist, +2 for physical assistance, +2 for safety/equipment Supine to sit: Max assist, HOB elevated Sit to supine: Max assist, +2 for physical assistance General bed mobility comments: Pt neeeding assist for balance EOB and bringing BLE off the bed as well as trunk elevation     Transfers   Overall transfer level: Needs assistance Equipment used: 1 person hand held assist Transfer via Lift Equipment: Stedy Transfers: Sit to/from Stand, Risk manager Sit to Stand: Max assist Stand pivot transfers: Total assist General transfer comment: assist to bring hips forward and pull up into Stedy with R UE supported. Pt with R lateral lean in standing and while seated in [place] requiring physical assist to maintain upright posture for transfer from chair to EOB via Stedy. Initially needed maxA +1 to stand to stedy from chair x2 reps but pt more fatigued with final stand from [facility] and  needed +2 staff assist and x2 attempts to maintain upright stance and move Stedy flaps prior to sitting. (4 total stands). Pivot from chair to bed via Stedy with totalA.     Ambulation / Gait / Stairs / Wheelchair Mobility   Ambulation/Gait Ambulation/Gait assistance: Max assist, +2 physical assistance Gait Distance (Feet): 1 Feet Assistive device: 1 person hand held assist Gait Pattern/deviations: Step-through pattern, Decreased stride length, Decreased weight shift to left, Decreased dorsiflexion - right, Staggering right, Trunk flexed General Gait Details: Unable Gait velocity: reduced Gait velocity interpretation: <1.31 ft/sec, indicative of household ambulator     Posture / Balance Dynamic Sitting Balance Sitting balance - Comments: Pt requiring max support to sit EOB Balance Overall balance assessment: Needs assistance Sitting-balance support: Single extremity supported, Feet supported Sitting balance-Leahy Scale: Poor (Poor to Zero) Sitting balance - Comments: Pt requiring max support to sit EOB Postural control: Right lateral lean, Posterior lean Standing balance support: Bilateral upper extremity supported Standing balance-Leahy Scale: Zero Standing balance comment: unable to fully stand     Special needs/care consideration Hgb A1c 6.5    Previous Home Environment  Living Arrangements: Spouse/significant other  Lives With: Spouse Available Help at Discharge: Family, Available 24 hours/day (spouse works days, son, Coralyn Mark, to provide 24/7 supervision) Type of Home: House Home Layout: One level Home Access: Stairs to enter Entrance Stairs-Rails: None Entrance Stairs-Number of Steps: 4 Bathroom Shower/Tub: Chiropodist: Handicapped height Bathroom Accessibility: Yes How Accessible: Accessible via walker Home Care Services: No Additional Comments: Husband works during the day   Discharge  Living Setting Plans for Discharge Living Setting: Patient's  home, Lives with (comment) (spouse) Type of Home at Discharge: House Discharge Home Layout: One level Discharge Home Access: Stairs to enter Entrance Stairs-Rails: None Entrance Stairs-Number of Steps: 4 Discharge Bathroom Shower/Tub: Tub/shower unit Discharge Bathroom Toilet: Handicapped height Discharge Bathroom Accessibility: Yes How Accessible: Accessible via walker Does the patient have any problems obtaining your medications?: No   Social/Family/Support Systems Patient Roles: Spouse, Parent Contact Information: son, Coralyn Mark. Spouse non English speaking Anticipated Caregiver: son, Coralyn Mark Anticipated Ambulance person Information: see above Ability/Limitations of Caregiver: spouse works days. Son , Coralyn Mark , unemployed Caregiver Availability: 24/7 Discharge Plan Discussed with Primary Caregiver: Yes Is Caregiver In Agreement with Plan?: Yes Does Caregiver/Family have Issues with Lodging/Transportation while Pt is in Rehab?: No   Son, Coralyn Mark, lives in converted "RV" parked across the street form her home for several weeks. We have had extensive discussions concerning the need for him to be in his Mom;s home when her spouse works to provide 24/7 physical care. Not just checking on her every hour for she has history of multiple falls. He states agreement.    Goals Patient/Family Goal for Rehab: Min assist with PT and OT Expected length of stay: ELOS 2 to 3 weeks; Son asking for 4 to 5 weeks which I explained is not typical and unlikely Pt/Family Agrees to Admission and willing to participate: Yes Program Orientation Provided & Reviewed with Pt/Caregiver Including Roles  & Responsibilities: Yes   Decrease burden of Care through IP rehab admission: n/a   Possible need for SNF placement upon discharge: Patient denies SNF and SNF's will not offer a bed for she is unvaccinated and unwilling to be vaccinated. I have discussed with patient and her son, Coralyn Mark, who supports her to be  unvaccinated at this time for COVID>   Patient Condition: I have reviewed medical records from [facility] , spoken with CM, and patient and son. I met with patient at the bedside for inpatient rehabilitation assessment.  Patient will benefit from ongoing PT and OT, can actively participate in 3 hours of therapy a day 5 days of the week, and can make measurable gains during the admission.  Patient will also benefit from the coordinated team approach during an Inpatient Acute Rehabilitation admission.  The patient will receive intensive therapy as well as Rehabilitation physician, nursing, social worker, and care management interventions.  Due to bladder management, bowel management, safety, skin/wound care, disease management, medication administration, pain management, and patient education the patient requires 24 hour a day rehabilitation nursing.  The patient is currently mod to max assist overall with mobility and basic ADLs.  Discharge setting and therapy post discharge at home with home health is anticipated.  Patient has agreed to participate in the Acute Inpatient Rehabilitation Program and will admit today.   Preadmission Screen Completed By:  Cleatrice Burke, 04/13/2021 11:18 AM ______________________________________________________________________   Discussed status with Dr. Naaman Plummer on  04/13/2021 at 1119 and received approval for admission today.   Admission Coordinator:  Cleatrice Burke, RN, time  1119 Date 04/13/2021    Assessment/Plan: Diagnosis: CVA Does the need for close, 24 hr/day Medical supervision in concert with the patient's rehab needs make it unreasonable for this patient to be served in a less intensive setting? Yes Co-Morbidities requiring supervision/potential complications: COPD, CKD, HTN, OSA, DM Due to bladder management, bowel management, safety, skin/wound care, disease management, medication administration, pain management, and patient education,  does the patient  require 24 hr/day rehab nursing? Yes Does the patient require coordinated care of a physician, rehab nurse, PT, OT  to address physical and functional deficits in the context of the above medical diagnosis(es)? Yes Addressing deficits in the following areas: balance, endurance, locomotion, strength, transferring, bowel/bladder control, bathing, dressing, feeding, grooming, toileting, and psychosocial support Can the patient actively participate in an intensive therapy program of at least 3 hrs of therapy 5 days a week? Yes The potential for patient to make measurable gains while on inpatient rehab is excellent Anticipated functional outcomes upon discharge from inpatient rehab: min assist PT, min assist OT, n/a SLP Estimated rehab length of stay to reach the above functional goals is: 2-3 weeks Anticipated discharge destination: Home 10. Overall Rehab/Functional Prognosis: excellent     MD Signature: Meredith Staggers, MD, [place] Physical Medicine & Rehabilitation 04/13/2021          Revision History                               Note Details  Author Meredith Staggers, MD File Time 04/13/2021 12:16 PM  Author Type Physician Status Signed  Last Editor Meredith Staggers, MD Service Physical Medicine and Rehabilitation

## 2021-04-13 NOTE — Progress Notes (Signed)
Orthopedic Tech Progress Note Patient Details:  Jade [name] 07-04-1956 [id number] Resting WHO has been ordered from [facility] Patient ID: Jade [name], female   DOB: May 04, 1956, 65 y.o.   MRN: [id number]  Smitty Pluck 04/13/2021, 5:52 PM

## 2021-04-13 NOTE — Progress Notes (Addendum)
Physical Therapy Treatment Patient Details Name: Jade [name] MRN: [id number] DOB: [birth year] Today's Date: 04/13/2021    History of Present Illness Pt is a 65 y.o. female who presented 04/01/21 with R-sided weakness. MRI reveals an embolic appearing stroke on the left, affecting a posterior MCA branch. Imaging revealed severe L ICA origin stenosis. Of note, a few weeks ago pt had stroke affecting her vision and R-hand strength, MRI then revealed acute L thalamic , anterior left frontal and possible right frontal and multiple remote lacunar infarcts. 8/11 pt s/p Left transcarotid artery revascularization (TCAR) with worsening right sided weakness immediately post-op with MRI evidence of expansion of previous left frontoparietal infarct. PMH: arthritis, CKD, COPD, CVA, fibromyalgia, gout, HTN, migraine, OSA, DM2.    PT Comments    Pt very motivated to move. Demonstrated movement with R shoulder flexion and extension with gravity minimized. Pt required +2 for transfers with use of LUE to power up to standing. Continue to recommend CIR, as pt is motivated to improve remaining deficits.   Follow Up Recommendations  CIR;Supervision/Assistance - 24 hour     Equipment Recommendations  Wheelchair (measurements PT);Wheelchair cushion (measurements PT)    Recommendations for Other Services       Precautions / Restrictions Precautions Precautions: Fall Precaution Comments: post CVA right hemiplegia, diplopia, SBP goal 130-160 per MD note Restrictions Weight Bearing Restrictions: No    Mobility  Bed Mobility Overal bed mobility: Needs Assistance Bed Mobility: Rolling;Sidelying to Sit Rolling: Max assist;+2 for physical assistance Sidelying to sit: Max assist;+2 for physical assistance       General bed mobility comments: MaxA +2 for bed mobility with assist from pad. Pt followed one step commands and was able to initiate LLE off bed and elevate trunk.    Transfers Overall transfer  level: Needs assistance   Transfers: Sit to/from Stand;Stand Pivot Transfers Sit to Stand: Max assist;+2 physical assistance Stand pivot transfers: +2 physical assistance;Total assist       General transfer comment: Performed sit to stand x3 max A +2. Provided assist to power up to standing and facilitate hips anteriorly. Bil knee block applied with 2/3 trials, with pt using rails on BSC to power up to stand on last trial.  Multimodal cues provided to lift head and trunk.  Ambulation/Gait             General Gait Details: Unable to assess   Stairs             Wheelchair Mobility    Modified Rankin (Stroke Patients Only)       Balance Overall balance assessment: Needs assistance Sitting-balance support: Feet supported;Single extremity supported Sitting balance-Leahy Scale: Poor Sitting balance - Comments: Requires external and UE support. Maintains sitting balance for < 10s with verbal cues to lift head. Postural control: Posterior lean;Left lateral lean     Standing balance comment: Unable to assess                            Cognition Arousal/Alertness: Awake/alert Behavior During Therapy: WFL for tasks assessed/performed Overall Cognitive Status: Within Functional Limits for tasks assessed                                 General Comments: Pt followed one-step commands consistently      Exercises General Exercises - Lower Extremity Long Arc Quad: PROM;Right;AROM;Left;10 reps;Seated  General Comments General comments (skin integrity, edema, etc.): VSS with BP 130/111(118) supine and 138/122(129) seated EOB      Pertinent Vitals/Pain Pain Assessment: No/denies pain    Home Living                      Prior Function            PT Goals (current goals can now be found in the care plan section) Acute Rehab PT Goals Patient Stated Goal: to be able to go to rehab and then home PT Goal Formulation: With  patient Time For Goal Achievement: 04/21/21 Potential to Achieve Goals: Fair Progress towards PT goals: Progressing toward goals    Frequency    Min 4X/week      PT Plan Current plan remains appropriate    Co-evaluation              AM-PAC PT "6 Clicks" Mobility   Outcome Measure  Help needed turning from your back to your side while in a flat bed without using bedrails?: Total Help needed moving from lying on your back to sitting on the side of a flat bed without using bedrails?: Total Help needed moving to and from a bed to a chair (including a wheelchair)?: Total Help needed standing up from a chair using your arms (e.g., wheelchair or bedside chair)?: Total   Help needed climbing 3-5 steps with a railing? : Total 6 Click Score: 5    End of Session Equipment Utilized During Treatment: Gait belt Activity Tolerance: Patient tolerated treatment well Patient left: Safely positioned on BSC, ;with call bell/phone within reach;with bed alarm set Nurse Communication: Mobility status PT Visit Diagnosis: Other abnormalities of gait and mobility (R26.89);Hemiplegia and hemiparesis Hemiplegia - Right/Left: Right Hemiplegia - dominant/non-dominant: Dominant Hemiplegia - caused by: Cerebral infarction     Time: 1213-1258 PT Time Calculation (min) (ACUTE ONLY): 45 min  Charges:  $Therapeutic Activity: 23-37 mins $Neuromuscular Re-education: 38-52 mins                     Velda Shell, SPT Acute Rehab: [phone]      Vance Gather 04/13/2021, 2:06 PM

## 2021-04-13 NOTE — Progress Notes (Signed)
INPATIENT REHABILITATION ADMISSION NOTE   Arrival Method: Bed       Mental Orientation: A+Ox4   Assessment: See flowsheet   Skin: scattered bruising to BUE + BLE, incision to left neck (skin glue)   IV'S: RAC and R hand   Pain: 0/10   Tubes and Drains: none   Safety Measures: see flowsheet   Vital Signs: see flowsheet   Height and Weight: see flowsheet   Rehab Orientation: completed   Family: son at bedside, all questions  answered

## 2021-04-13 NOTE — Progress Notes (Addendum)
Inpatient Rehabilitation Admissions Coordinator   I met with patient at bedside and will contact her son. CIR bed is available to admit to today and she is in agreement . I will make the arrangements. Acute team and TOC made aware.  Danne Baxter, RN, MSN Rehab Admissions Coordinator [phone] 04/13/2021 11:15 AM

## 2021-04-13 NOTE — Progress Notes (Signed)
Inpatient Rehabilitation Medication Review by a Pharmacist  A complete drug regimen review was completed for this patient to identify any potential clinically significant medication issues.  High Risk Drug Classes Is patient taking? Indication by Medication  Antipsychotic No   Anticoagulant Yes Heparin subq for DVT prophylaxis   Antibiotic No   Opioid Yes Percocet for pain  Antiplatelet Yes Aspirin and Plavix for acute CVA  Hypoglycemics/insulin Yes Novolog for sliding scale glucose control  Vasoactive Medication No   Chemotherapy No   Other No      Type of Medication Issue Identified Description of Issue Recommendation(s)  Drug Interaction(s) (clinically significant)  N/A   Duplicate Therapy  N/A   Allergy  N/A   No Medication Administration End Date  Aspirin 3 month end date not listed F/U in AM rounds  Incorrect Dose  N/A   Additional Drug Therapy Needed  Flexeril PRN F/U In AM rounds  Significant med changes from prior encounter (inform family/care partners about these prior to discharge). N/A   Other  N/A     Clinically significant medication issues were identified that warrant physician communication and completion of prescribed/recommended actions by midnight of the next day:  Yes  Name of provider notified for urgent issues identified: F/U in AM rounds  Provider Method of Notification:     Pharmacist comments:  Time spent performing this drug regimen review (minutes):  15 mins   Tad Moore 04/13/2021 6:49 PM

## 2021-04-13 NOTE — H&P (Signed)
Physical Medicine and Rehabilitation Admission H&P        Chief Complaint  Patient presents with   Numbness  : HPI: Jade [name] is a 65 year old right-handed female with history significant for CVA, COPD, CKD stage III hypertension, hypertension, OSA, diabetes mellitus.  Recent admission 03/05/2021 to 03/14/2021 for CVA/left inferior thalamic maintained on aspirin and Plavix maintained on aspirin and Plavix.  Per chart review patient lives with spouse.  Modified independent with rolling walker.  1 level home 4 steps to entry.  Presented 04/01/2021 with right side weakness.  CT/MRI showed small acute infarcts in the left frontal parietal junction.  Additional small focus of acute ischemia adjacent to the frontal horn of the left lateral ventricle with petechial hemorrhage.  Multiple old small vessel infarct and findings of chronic ischemic microangiopathy.  CT angiogram of head and neck severe left ICA origin stenosis due to calcified plaque, over 80%.  40% narrowing at the right ICA origin.  High-grade left M3 branch stenosis which could be related to atherosclerosis or recent embolic disease.  Admission chemistries unremarkable except potassium 3.3, creatinine 0.96, urine drug screen negative.  Echocardiogram of 03/06/2021 showed ejection fraction of 55 to 35% grade 1 diastolic dysfunction.  Vascular surgery follow-up in regards to severe left carotid artery stenosis underwent left transcarotid artery revascularization 04/06/2021 per Dr. Stanford Breed.  Currently maintained on full-strength aspirin 325 mg daily as well as Plavix for CVA prophylaxis.  Subcutaneous heparin for DVT prophylaxis x3 months then Plavix alone.  Tolerating a regular consistency diet.  Therapy evaluations completed due to patient's right side weakness decreased functional mobility was admitted for a comprehensive rehab program.   Review of Systems  Constitutional:  Negative for chills and fever.  HENT:  Negative for hearing loss.    Eyes:  Negative for blurred vision and double vision.  Respiratory:  Negative for cough and shortness of breath.   Cardiovascular:  Positive for leg swelling. Negative for chest pain and palpitations.  Gastrointestinal:  Positive for constipation. Negative for heartburn, nausea and vomiting.  Genitourinary:  Negative for dysuria, flank pain and hematuria.  Musculoskeletal:  Positive for joint pain and myalgias.  Skin:  Negative for rash.  Neurological:  Positive for speech change, weakness and headaches.  All other systems reviewed and are negative.     Past Medical History:  Diagnosis Date   Arthritis      "back, knees, some in my left hip" (03/21/2018)   Chronic kidney disease      "was told I had 25% kidney function in early 2012" (03/21/2018)   COPD (chronic obstructive pulmonary disease) (HCC)     CVA (cerebral vascular accident) ([place]) 03/21/2018    "numb all over; head to toe" (03/21/2018)   Fibromyalgia     History of gout     Hyperlipidemia     Hypertension     Migraine      "used to get them monthly during menopause" (03/21/2018)   Neuromuscular disorder ([place])     OSA (obstructive sleep apnea)      "don't tolerate the machine" (03/21/2018)   Pneumonia ~ 2007   Type 2 diabetes, diet controlled ([place])           Past Surgical History:  Procedure Laterality Date   HERNIA REPAIR       LAPAROSCOPIC CHOLECYSTECTOMY   06/2007    "no UHR w/this" (03/21/2018)   TONSILLECTOMY       TRANSCAROTID ARTERY REVASCULARIZATION  Left 04/06/2021  Procedure: LEFT TRANSCAROTID ARTERY REVASCULARIZATION;  Surgeon: Cherre Robins, MD;  Location: [facility] OR;  Service: Vascular;  Laterality: Left;   ULTRASOUND GUIDANCE FOR VASCULAR ACCESS Right 04/06/2021    Procedure: ULTRASOUND GUIDANCE FOR VASCULAR ACCESS;  Surgeon: Cherre Robins, MD;  Location: [facility] OR;  Service: Vascular;  Laterality: Right;   UMBILICAL HERNIA REPAIR   2009         Family History  Problem Relation Age of Onset   Heart  disease Father     Leukemia Father     Alcohol abuse Son     Other Mother     Cancer Neg Hx     Diabetes Neg Hx     Hearing loss Neg Hx     Hyperlipidemia Neg Hx     Hypertension Neg Hx     Kidney disease Neg Hx     Stroke Neg Hx      Social History:  reports that she quit smoking about 33 years ago. Her smoking use included cigarettes. She has a 20.00 pack-year smoking history. She has never used smokeless tobacco. She reports that she does not currently use alcohol. She reports that she does not currently use drugs after having used the following drugs: Marijuana. Allergies:      Allergies  Allergen Reactions   Nickel Dermatitis and Rash          Medications Prior to Admission  Medication Sig Dispense Refill   amLODipine (NORVASC) 10 MG tablet Take 1 tablet (10 mg total) by mouth daily. 30 tablet 0   [EXPIRED] aspirin 81 MG EC tablet Take 1 tablet (81 mg total) by mouth daily for 21 days. Swallow whole. 21 tablet 0   atorvastatin (LIPITOR) 80 MG tablet Take 1 tablet (80 mg total) by mouth daily. 30 tablet 0   b complex vitamins capsule Take 1 capsule by mouth daily.       Cholecalciferol (VITAMIN D-3) 5000 UNITS TABS Take 5,000 Units by mouth daily.        clopidogrel (PLAVIX) 75 MG tablet Take 1 tablet (75 mg total) by mouth daily. 30 tablet 0   Coenzyme Q10 (COQ-10) 100 MG CAPS Take 100 mg by mouth at bedtime.       hydrochlorothiazide (MICROZIDE) 12.5 MG capsule Take 1 capsule (12.5 mg total) by mouth daily. 30 capsule 0   magnesium gluconate (MAGONATE) 500 MG tablet Take 1,000 mg by mouth at bedtime.       Menaquinone-7 (VITAMIN K2 PO) Take 1 tablet by mouth daily.       Omega-3 Fatty Acids (FISH OIL) 600 MG CAPS Take 600 mg by mouth daily.       Blood Glucose Calibration (ACCU-CHEK SMARTVIEW CONTROL) LIQD Test up to TID dx:E11.40 3 each 3   Blood Glucose Monitoring Suppl (ACCU-CHEK NANO SMARTVIEW) W/DEVICE KIT Test up to TID dx:E11.40 1 kit 3   glucose blood (ACCU-CHEK  SMARTVIEW) test strip Test up to TID dx:E11.40 300 each 3   Lancets Misc. (ACCU-CHEK FASTCLIX LANCET) KIT Test up to TID dx:E11.40 1 kit 2      Drug Regimen Review Drug regimen was reviewed and remains appropriate with no significant issues identified.   Home: Home Living Family/patient expects to be discharged to:: Skilled nursing facility Living Arrangements: Spouse/significant other Available Help at Discharge: Family, Available 24 hours/day (spouse works days, son, Coralyn Mark, to provide 24/7 supervision) Type of Home: House Home Access: Stairs to enter [employer] of Steps: 4 Entrance Stairs-Rails: None  Home Layout: One level Bathroom Shower/Tub: Chiropodist: Handicapped height Bathroom Accessibility: Yes Home Equipment: Walker - 2 wheels, Bedside commode, Other (comment) (hurry cane) Additional Comments: Husband works during the day  Lives With: Spouse   Functional History: Prior Function Level of Independence: Needs assistance Gait / Transfers Assistance Needed: Mod I with use of RW ADL's / Homemaking Assistance Needed: Used L hand to assist R with ADLs Comments: doesn't drive   Functional Status:  Mobility: Bed Mobility Overal bed mobility: Needs Assistance Bed Mobility: Supine to Sit Rolling: Max assist Sidelying to sit: Max assist, +2 for physical assistance, +2 for safety/equipment Supine to sit: Max assist, HOB elevated Sit to supine: Max assist, +2 for physical assistance General bed mobility comments: Pt neeeding assist for balance EOB and bringing BLE off the bed as well as trunk elevation Transfers Overall transfer level: Needs assistance Equipment used: 1 person hand held assist Transfer via Lift Equipment: Stedy Transfers: Sit to/from Stand, Risk manager Sit to Stand: Max assist Stand pivot transfers: Total assist General transfer comment: assist to bring hips forward and pull up into Stedy with R UE supported. Pt  with R lateral lean in standing and while seated in [place] requiring physical assist to maintain upright posture for transfer from chair to EOB via Stedy. Initially needed maxA +1 to stand to stedy from chair x2 reps but pt more fatigued with final stand from [facility] and needed +2 staff assist and x2 attempts to maintain upright stance and move Stedy flaps prior to sitting. (4 total stands). Pivot from chair to bed via Stedy with totalA. Ambulation/Gait Ambulation/Gait assistance: Max assist, +2 physical assistance Gait Distance (Feet): 1 Feet Assistive device: 1 person hand held assist Gait Pattern/deviations: Step-through pattern, Decreased stride length, Decreased weight shift to left, Decreased dorsiflexion - right, Staggering right, Trunk flexed General Gait Details: Unable Gait velocity: reduced Gait velocity interpretation: <1.31 ft/sec, indicative of household ambulator   ADL: ADL Overall ADL's : Needs assistance/impaired Eating/Feeding: Set up, Minimal assistance Eating/Feeding Details (indicate cue type and reason): Pt requires set up to min, due to needing to be repositioned in an upright seated position with pillows to reduce R sided lean Grooming: Wash/dry hands, Wash/dry face, Set up, Sitting Grooming Details (indicate cue type and reason): Provided a washcloth in recliner, pt able to use L hand and wash her face and R hand Upper Body Bathing: Moderate assistance Upper Body Bathing Details (indicate cue type and reason): supported sitting Lower Body Bathing: Total assistance Lower Body Bathing Details (indicate cue type and reason): Max A+2 sit<>stand Upper Body Dressing : Maximal assistance Upper Body Dressing Details (indicate cue type and reason): supported sitting Lower Body Dressing: Maximal assistance, Bed level Lower Body Dressing Details (indicate cue type and reason): Patient able to lift BLE as therapist assisted with threading legs into mesh panties. Patient then able to  bridge in supine with assist to hike over hips. Additional assist required to maintain flexed position of RLE. Toilet Transfer: Maximal assistance, +2 for physical assistance, Stand-pivot, BSC Toilet Transfer Details (indicate cue type and reason): simulated transferrring to recliner Toileting- Clothing Manipulation and Hygiene: Total assistance Toileting - Clothing Manipulation Details (indicate cue type and reason): Max A+2 sit<>stand Functional mobility during ADLs: Maximal assistance, +2 for physical assistance, +2 for safety/equipment General ADL Comments: Pt assisting with transfer to recliner to set up for breakfast so that she can self feed.   Cognition: Cognition Overall Cognitive Status: Impaired/Different from baseline  Arousal/Alertness: Awake/alert Orientation Level: Oriented X4 Cognition Arousal/Alertness: Awake/alert Behavior During Therapy: WFL for tasks assessed/performed Overall Cognitive Status: Impaired/Different from baseline Area of Impairment: Memory, Problem solving, Following commands Memory: Decreased short-term memory Following Commands: Follows one step commands with increased time, Follows multi-step commands inconsistently Problem Solving: Slow processing, Difficulty sequencing, Requires verbal cues, Requires tactile cues General Comments: Some word finding difficulty noted but with increased time and some assist from staff, able to mostly express her needs to therapist. Pt pleasantly cooperative.   Physical Exam: Blood pressure (!) 113/93, pulse 94, temperature 98.1 F (36.7 C), resp. rate 18, height $RemoveBe'5\' 1"'iTlnboYjd$  (1.549 m), weight 76.2 kg, SpO2 97 %. Physical Exam Constitutional:      General: She is not in acute distress.    Appearance: She is obese.  HENT:     Head: Normocephalic.     Right Ear: External ear normal.     Left Ear: External ear normal.     Nose: Nose normal.     Mouth/Throat:     Mouth: Mucous membranes are moist.     Pharynx: No  oropharyngeal exudate.  Eyes:     Extraocular Movements: Extraocular movements intact.     Conjunctiva/sclera: Conjunctivae normal.     Pupils: Pupils are equal, round, and reactive to light.  Cardiovascular:     Rate and Rhythm: Normal rate and regular rhythm.     Heart sounds:    Gallop present.  Pulmonary:     Effort: Pulmonary effort is normal. No respiratory distress.     Breath sounds: No wheezing. O2 via  Abdominal:     General: Bowel sounds are normal. There is no distension.     Palpations: Abdomen is soft.     Tenderness: There is no abdominal tenderness.  Musculoskeletal:        General: Swelling present.     Cervical back: Normal range of motion. No rigidity.  Skin:    General: Skin is warm.     Findings: Bruising and lesion (scalp) present.     Comments: Left neck incision CDI, mild bruising  Neurological:     Mental Status: She is alert.     Comments: Alert and oriented x 3. Normal insight and awareness. Intact Memory. Normal language and speech. Cranial nerve exam non-focal. RUE tr pec but 0/5 otherwise. RLE trace HE but 0/5 otherwise. Sl decrease in LT RUE and RLE. DTR's 2+ RUE and 3+ RLE. LUE and LLE 5/5 with normal sensation.   Psychiatric:        Mood and Affect: Mood normal.        Behavior: Behavior normal.      Lab Results Last 48 Hours        Results for orders placed or performed during the hospital encounter of 04/01/21 (from the past 48 hour(s))  Glucose, capillary     Status: Abnormal    Collection Time: 04/11/21 10:55 AM  Result Value Ref Range    Glucose-Capillary 203 (H) 70 - 99 mg/dL      Comment: Glucose reference range applies only to samples taken after fasting for at least 8 hours.  Glucose, capillary     Status: Abnormal    Collection Time: 04/11/21  4:09 PM  Result Value Ref Range    Glucose-Capillary 124 (H) 70 - 99 mg/dL      Comment: Glucose reference range applies only to samples taken after fasting for at least 8 hours.  Glucose,  capillary  Status: Abnormal    Collection Time: 04/11/21  9:41 PM  Result Value Ref Range    Glucose-Capillary 144 (H) 70 - 99 mg/dL      Comment: Glucose reference range applies only to samples taken after fasting for at least 8 hours.    Comment 1 Notify RN      Comment 2 Document in Chart    Basic metabolic panel     Status: Abnormal    Collection Time: 04/12/21 12:53 AM  Result Value Ref Range    Sodium 136 135 - 145 mmol/L    Potassium 4.2 3.5 - 5.1 mmol/L    Chloride 104 98 - 111 mmol/L    CO2 23 22 - 32 mmol/L    Glucose, Bld 127 (H) 70 - 99 mg/dL      Comment: Glucose reference range applies only to samples taken after fasting for at least 8 hours.    BUN 18 8 - 23 mg/dL    Creatinine, Ser 1.19 (H) 0.44 - 1.00 mg/dL    Calcium 9.8 8.9 - 10.3 mg/dL    GFR, Estimated 50 (L) >60 mL/min      Comment: (NOTE) Calculated using the CKD-EPI Creatinine Equation (2021)      Anion gap 9 5 - 15      Comment: Performed at [facility] [address], [place], [place] [address]  Glucose, capillary     Status: Abnormal    Collection Time: 04/12/21  6:17 AM  Result Value Ref Range    Glucose-Capillary 154 (H) 70 - 99 mg/dL      Comment: Glucose reference range applies only to samples taken after fasting for at least 8 hours.    Comment 1 Notify RN      Comment 2 Document in Chart    Glucose, capillary     Status: Abnormal    Collection Time: 04/12/21 12:46 PM  Result Value Ref Range    Glucose-Capillary 179 (H) 70 - 99 mg/dL      Comment: Glucose reference range applies only to samples taken after fasting for at least 8 hours.  Glucose, capillary     Status: Abnormal    Collection Time: 04/12/21  4:43 PM  Result Value Ref Range    Glucose-Capillary 230 (H) 70 - 99 mg/dL      Comment: Glucose reference range applies only to samples taken after fasting for at least 8 hours.  Glucose, capillary     Status: Abnormal    Collection Time: 04/12/21  8:08 PM  Result Value Ref  Range    Glucose-Capillary 184 (H) 70 - 99 mg/dL      Comment: Glucose reference range applies only to samples taken after fasting for at least 8 hours.  CBC     Status: Abnormal    Collection Time: 04/13/21  2:05 AM  Result Value Ref Range    WBC 13.8 (H) 4.0 - 10.5 K/uL    RBC 4.27 3.87 - 5.11 MIL/uL    Hemoglobin 13.6 12.0 - 15.0 g/dL    HCT 40.0 36.0 - 46.0 %    MCV 93.7 80.0 - 100.0 fL    MCH 31.9 26.0 - 34.0 pg    MCHC 34.0 30.0 - 36.0 g/dL    RDW 13.2 11.5 - 15.5 %    Platelets 363 150 - 400 K/uL    nRBC 0.0 0.0 - 0.2 %      Comment: Performed at [facility], [address] [place], [place] [address]  Basic metabolic panel     Status: Abnormal    Collection Time: 04/13/21  2:05 AM  Result Value Ref Range    Sodium 136 135 - 145 mmol/L    Potassium 4.0 3.5 - 5.1 mmol/L    Chloride 100 98 - 111 mmol/L    CO2 25 22 - 32 mmol/L    Glucose, Bld 143 (H) 70 - 99 mg/dL      Comment: Glucose reference range applies only to samples taken after fasting for at least 8 hours.    BUN 27 (H) 8 - 23 mg/dL    Creatinine, Ser 1.49 (H) 0.44 - 1.00 mg/dL    Calcium 10.1 8.9 - 10.3 mg/dL    GFR, Estimated 39 (L) >60 mL/min      Comment: (NOTE) Calculated using the CKD-EPI Creatinine Equation (2021)      Anion gap 11 5 - 15      Comment: Performed at [facility] [address], [place], [place] [address]  Glucose, capillary     Status: Abnormal    Collection Time: 04/13/21  6:05 AM  Result Value Ref Range    Glucose-Capillary 159 (H) 70 - 99 mg/dL      Comment: Glucose reference range applies only to samples taken after fasting for at least 8 hours.      Imaging Results (Last 48 hours)  No results found.           Medical Problem List and Plan: 1.  Right side weakness secondary to extension of acute infarct of the left precentral gyrus, new b/l PCA small infarcts etiology likely intracranial stenosis in the setting of fluctuating BP             -patient may   shower             -ELOS/Goals: 20-24 days, min assist with PT and OT             -admit to inpatient rehab             -PRAFO/WHO ordered 2.  Antithrombotics: -DVT/anticoagulation: Subcutaneous heparin              -antiplatelet therapy: Aspirin 325 mg daily and Plavix 75 mg daily x3 months then Plavix alone 3. Pain Management: Neurontin 200 mg nightly, oxycodone as needed, Flexeril as needed 4. Mood: Provide emotional support             -antipsychotic agents: N/A 5. Neuropsych: This patient is capable of making decisions on her own behalf. 6. Skin/Wound Care: Routine skin checks 7. Fluids/Electrolytes/Nutrition: Routine and outs with follow-up chemistries ordered 8.  Symptomatic left ICA stenosis.  Status post left transcarotid artery revascularization 04/06/2021.  Vascular surgery following 9.  Hypertension.  Norvasc 10 mg daily, HCTZ 12.5 mg daily.  Monitor with increased mobility 10.  Diabetes mellitus.  Hemoglobin A1c 6.5.  SSI.             -monitor CBG's AC/HS 11.  Hyperlipidemia.  Lipitor 12.  GERD.  Protonix 13.  CKD stage III.  Baseline creatinine 1.18-1.34.  Follow-up chemistries 14.  Obesity.  BMI 31.74.  Dietary follow-up 15.  COPD.  Quit smoking 33 years ago.  Check oxygen saturations every shift   -oxygen per [place] at 2L currently   Cathlyn Parsons, PA-C 04/13/2021   I have personally performed a face to face diagnostic evaluation of this patient and formulated the key components  of the plan.  Additionally, I have personally reviewed laboratory data, imaging studies, as well as relevant notes and concur with the physician assistant's documentation above.  The patient's status has not changed from the original H&P.  Any changes in documentation from the acute care chart have been noted above.  Meredith Staggers, MD, [facility]

## 2021-04-13 NOTE — Progress Notes (Addendum)
[facility]  Subjective  - No new complaints still no motor on the right side.   Objective 129/74 96 98.2 F (36.8 C) (Oral) 20 95%  Intake/Output Summary (Last 24 hours) at 04/13/2021 0708 Last data filed at 04/12/2021 2322 Gross per 24 hour  Intake 240 ml  Output 600 ml  Net -360 ml    Flaccid right UE/LE, warm and well perused with intact sensation Speech clear, alert & O x 3 Left subclavian incision with local edema, non expanding hematoma+ ecchymosis Heart RRR Lungs non labored breathing   Assessment/Planning: 04/06/21 TCAR with periop stroke right side flaccid   Pending discharge to CIR for rehab when bed available ASA/Plavix/Statin daily Follow up has been arranged with our office in 3-4 weeks  Mosetta Pigeon 04/13/2021 7:08 AM --  Laboratory Lab Results: Recent Labs    04/11/21 0200 04/13/21 0205  WBC 11.3* 13.8*  HGB 12.9 13.6  HCT 37.5 40.0  PLT 316 363   BMET Recent Labs    04/12/21 0053 04/13/21 0205  NA 136 136  K 4.2 4.0  CL 104 100  CO2 23 25  GLUCOSE 127* 143*  BUN 18 27*  CREATININE 1.19* 1.49*  CALCIUM 9.8 10.1    COAG Lab Results  Component Value Date   INR 1.0 04/01/2021   INR 0.90 03/21/2018   No results found for: PTT  VASCULAR STAFF ADDENDUM: I agree with the above.   Rande Brunt. Lenell Antu, MD [facility] Phone Number: [phone] 04/13/2021 12:50 PM

## 2021-04-14 DIAGNOSIS — I63512 Cerebral infarction due to unspecified occlusion or stenosis of left middle cerebral artery: Secondary | ICD-10-CM

## 2021-04-14 LAB — CBC WITH DIFFERENTIAL/PLATELET
Abs Immature Granulocytes: 0.06 10*3/uL (ref 0.00–0.07)
Basophils Absolute: 0.1 10*3/uL (ref 0.0–0.1)
Basophils Relative: 1 %
Eosinophils Absolute: 0.4 10*3/uL (ref 0.0–0.5)
Eosinophils Relative: 4 %
HCT: 39.5 % (ref 36.0–46.0)
Hemoglobin: 13.4 g/dL (ref 12.0–15.0)
Immature Granulocytes: 1 %
Lymphocytes Relative: 31 %
Lymphs Abs: 3.1 10*3/uL (ref 0.7–4.0)
MCH: 31.8 pg (ref 26.0–34.0)
MCHC: 33.9 g/dL (ref 30.0–36.0)
MCV: 93.8 fL (ref 80.0–100.0)
Monocytes Absolute: 1 10*3/uL (ref 0.1–1.0)
Monocytes Relative: 10 %
Neutro Abs: 5.5 10*3/uL (ref 1.7–7.7)
Neutrophils Relative %: 53 %
Platelets: 351 10*3/uL (ref 150–400)
RBC: 4.21 MIL/uL (ref 3.87–5.11)
RDW: 13.2 % (ref 11.5–15.5)
WBC: 10.1 10*3/uL (ref 4.0–10.5)
nRBC: 0 % (ref 0.0–0.2)

## 2021-04-14 LAB — COMPREHENSIVE METABOLIC PANEL
ALT: 20 U/L (ref 0–44)
AST: 28 U/L (ref 15–41)
Albumin: 3.4 g/dL — ABNORMAL LOW (ref 3.5–5.0)
Alkaline Phosphatase: 66 U/L (ref 38–126)
Anion gap: 11 (ref 5–15)
BUN: 33 mg/dL — ABNORMAL HIGH (ref 8–23)
CO2: 23 mmol/L (ref 22–32)
Calcium: 9.6 mg/dL (ref 8.9–10.3)
Chloride: 102 mmol/L (ref 98–111)
Creatinine, Ser: 1.36 mg/dL — ABNORMAL HIGH (ref 0.44–1.00)
GFR, Estimated: 43 mL/min — ABNORMAL LOW (ref >60)
Glucose, Bld: 140 mg/dL — ABNORMAL HIGH (ref 70–99)
Potassium: 3.9 mmol/L (ref 3.5–5.1)
Sodium: 136 mmol/L (ref 135–145)
Total Bilirubin: 1 mg/dL (ref 0.3–1.2)
Total Protein: 6.9 g/dL (ref 6.5–8.1)

## 2021-04-14 LAB — GLUCOSE, CAPILLARY
Glucose-Capillary: 158 mg/dL — ABNORMAL HIGH (ref 70–99)
Glucose-Capillary: 136 mg/dL — ABNORMAL HIGH (ref 70–99)
Glucose-Capillary: 154 mg/dL — ABNORMAL HIGH (ref 70–99)
Glucose-Capillary: 146 mg/dL — ABNORMAL HIGH (ref 70–99)
Glucose-Capillary: 199 mg/dL — ABNORMAL HIGH (ref 70–99)

## 2021-04-14 NOTE — Evaluation (Signed)
Physical Therapy Assessment and Plan  Patient Details  Name: Jade [name] MRN: [id number] Date of Birth: [birth year]  PT Diagnosis: Abnormal posture, Abnormality of gait, Cognitive deficits, Coordination disorder, Hemiplegia dominant, Hypertonia, Impaired cognition, Impaired sensation, and Muscle weakness Rehab Potential: Good ELOS: 26-30 days   Today's Date: 04/14/2021 PT Individual Time: 1000-1100 PT Individual Time Calculation (min): 60 min    Hospital Problem: Principal Problem:   CVA (cerebral vascular accident) [facility])   Past Medical History:  Past Medical History:  Diagnosis Date   Arthritis    "back, knees, some in my left hip" (03/21/2018)   Chronic kidney disease    "was told I had 25% kidney function in early 2012" (03/21/2018)   COPD (chronic obstructive pulmonary disease) ([place])    CVA (cerebral vascular accident) ([place]) 03/21/2018   "numb all over; head to toe" (03/21/2018)   Fibromyalgia    History of gout    Hyperlipidemia    Hypertension    Migraine    "used to get them monthly during menopause" (03/21/2018)   Neuromuscular disorder ([place])    OSA (obstructive sleep apnea)    "don't tolerate the machine" (03/21/2018)   Pneumonia ~ 2007   Type 2 diabetes, diet controlled ([place])    Past Surgical History:  Past Surgical History:  Procedure Laterality Date   HERNIA REPAIR     LAPAROSCOPIC CHOLECYSTECTOMY  06/2007   "no UHR w/this" (03/21/2018)   TONSILLECTOMY     TRANSCAROTID ARTERY REVASCULARIZATION  Left 04/06/2021   Procedure: LEFT TRANSCAROTID ARTERY REVASCULARIZATION;  Surgeon: Cherre Robins, MD;  Location: [facility] OR;  Service: Vascular;  Laterality: Left;   ULTRASOUND GUIDANCE FOR VASCULAR ACCESS Right 04/06/2021   Procedure: ULTRASOUND GUIDANCE FOR VASCULAR ACCESS;  Surgeon: Cherre Robins, MD;  Location[facility] OR;  Service: Vascular;  Laterality: Right;   UMBILICAL HERNIA REPAIR  2009    Assessment & Plan Clinical Impression: Patient is a 65 year old  right-handed female with history significant for CVA, COPD, CKD stage III hypertension, hypertension, OSA, diabetes mellitus.  Recent admission 03/05/2021 to 03/14/2021 for CVA/left inferior thalamic maintained on aspirin and Plavix maintained on aspirin and Plavix.  Per chart review patient lives with spouse.  Modified independent with rolling walker.  1 level home 4 steps to entry.  Presented 04/01/2021 with right side weakness.  CT/MRI showed small acute infarcts in the left frontal parietal junction.  Additional small focus of acute ischemia adjacent to the frontal horn of the left lateral ventricle with petechial hemorrhage.  Multiple old small vessel infarct and findings of chronic ischemic microangiopathy.  CT angiogram of head and neck severe left ICA origin stenosis due to calcified plaque, over 80%.  40% narrowing at the right ICA origin.  High-grade left M3 branch stenosis which could be related to atherosclerosis or recent embolic disease.  Admission chemistries unremarkable except potassium 3.3, creatinine 0.96, urine drug screen negative.  Echocardiogram of 03/06/2021 showed ejection fraction of 55 to 69% grade 1 diastolic dysfunction.  Vascular surgery follow-up in regards to severe left carotid artery stenosis underwent left transcarotid artery revascularization 04/06/2021 per Dr. Stanford Breed.   Patient transferred to CIR on 04/13/2021 .   Patient currently requires max with mobility secondary to muscle weakness, muscle joint tightness, and muscle paralysis, decreased cardiorespiratoy endurance, abnormal tone, decreased coordination, and decreased motor planning, decreased visual perceptual skills, decreased midline orientation and decreased attention to right, and decreased sitting balance, decreased standing balance, decreased postural control, hemiplegia, and decreased balance strategies.  Prior to hospitalization, patient was modified independent  with mobility and lived with Spouse in a House home.  Home  access is 1 thresholdStairs to enter.  Patient will benefit from skilled PT intervention to maximize safe functional mobility, minimize fall risk, and decrease caregiver burden for planned discharge home with 24 hour assist.  Anticipate patient will  benefit from SNF placement  at discharge.  PT - End of Session Activity Tolerance: Tolerates < 10 min activity, no significant change in vital signs Endurance Deficit: No PT Assessment Rehab Potential (ACUTE/IP ONLY): Good PT Barriers to Discharge: Decreased caregiver support;Home environment access/layout;Lack of/limited family support;Insurance for SNF coverage;Medication compliance PT Patient demonstrates impairments in the following area(s): Warehouse manager;Behavior;Edema;Motor;Endurance;Nutrition;Pain;Sensory;Perception;Safety PT Transfers Functional Problem(s): Bed Mobility;Car;Bed to Chair;Furniture;Floor PT Locomotion Functional Problem(s): Ambulation;Wheelchair Mobility;Stairs PT Plan PT Intensity: Minimum of 1-2 x/day ,45 to 90 minutes PT Frequency: 5 out of 7 days;Total of 15 hours over 7 days of combined therapies PT Duration Estimated Length of Stay: 26-30 days PT Treatment/Interventions: Ambulation/gait training;Balance/vestibular training;Cognitive remediation/compensation;Community reintegration;Disease management/prevention;DME/adaptive equipment instruction;Discharge planning;Functional mobility training;Neuromuscular re-education;Functional electrical stimulation;Pain management;Patient/family education;Splinting/orthotics;Skin care/wound management;Psychosocial support;Stair training;Therapeutic Activities;Therapeutic Exercise;Visual/perceptual remediation/compensation;UE/LE Coordination activities;UE/LE Strength taining/ROM;Wheelchair propulsion/positioning PT Transfers Anticipated Outcome(s): Min assist PT Locomotion Anticipated Outcome(s): min assist ambulation for house hold distances. supervision assist WC mobility PT  Recommendation Follow Up Recommendations: Skilled nursing facility Patient destination: [place] (SNF) Equipment Recommended: To be determined   PT Evaluation Precautions/Restrictions   Fall  General   Vital SignsTherapy Vitals Temp: 98 F (36.7 C) Temp Source: Oral Pulse Rate: 77 Resp: 16 BP: (!) 157/72 Patient Position (if appropriate): Lying Oxygen Therapy SpO2: 96 % O2 Device: Room Air Pain Pain Assessment Pain Scale: 0-10 Pain Score: 4  Pain Type: Acute pain Pain Location: Leg Pain Orientation: Right Pain Intervention(s): Medication (See eMAR) Home Living/Prior Functioning Home Living Available Help at Discharge: Family;Available PRN/intermittently Type of Home: House Home Access: Stairs to enter [employer] of Steps: 1 threshold Home Layout: One level Bathroom Shower/Tub: Chiropodist: Handicapped height Bathroom Accessibility: Yes Additional Comments: Husband works during the day, son is supposed to provide assist while he is at work.  Lives With: Spouse Prior Function Level of Independence: Requires assistive device for independence;Needs assistance with ADLs;Needs assistance with homemaking Driving: No Vocation: On disability Comments: doesn't drive Vision/Perception  Vision - Assessment Eye Alignment: Impaired (comment) Ocular Range of Motion: Restricted looking up;Restricted looking down Alignment/Gaze Preference: Within Defined Limits Diplopia Assessment: Disappears with one eye closed;Present in far gaze;Only with left gaze;Objects split on top of one another Perception Perception: Impaired Spatial Orientation: increase pusher tendencies to the right in sitting and standing Praxis Praxis: Impaired Praxis Impairment Details: Motor planning  Cognition Overall Cognitive Status: Impaired/Different from baseline (patient endorsed decreased memory at baseline) Arousal/Alertness: Awake/alert Orientation  Level: Oriented X4 Attention: Sustained;Selective;Focused Focused Attention: Appears intact Sustained Attention: Appears intact Selective Attention: Impaired Selective Attention Impairment: Functional basic;Verbal basic Memory: Impaired Memory Impairment: Storage deficit;Decreased recall of new information (patient endorsed decreased memory at baseline) Awareness: Appears intact Problem Solving: Impaired Problem Solving Impairment: Verbal complex Safety/Judgment: Appears intact Sensation Sensation Light Touch: Impaired Detail Peripheral sensation comments: baseline neuropathy Light Touch Impaired Details: Impaired RLE;Impaired LLE Hot/Cold: Appears Intact Proprioception: Not tested;Impaired Detail Proprioception Impaired Details: Impaired RLE Stereognosis: Not tested Additional Comments: Light touch intact in RUE with gross testing. Coordination Gross Motor Movements are Fluid and Coordinated: No Fine Motor Movements are Fluid and Coordinated: No Coordination and Movement Description: Pt  with Brunnstrum stage II movement in the right arm and hand with flexor synergy and increased flexor tone noted in the elbow and digits.  She needs max hand over hand to integrate into function. Motor  Motor Motor: Hemiplegia;Abnormal tone;Abnormal postural alignment and control;Motor impersistence Motor - Skilled Clinical Observations: RUE and RLE hemiparesis with increased flexor tone developing and pusher tendencies to the right.   Trunk/Postural Assessment  Cervical Assessment Cervical Assessment: Exceptions to [facility] (cervical protraction and flexion) Thoracic Assessment Thoracic Assessment: Exceptions to [facility] (thoracic rounding with right lean) Lumbar Assessment Lumbar Assessment: Exceptions to [facility] (posterior pelvic tilt) Postural Control Postural Control: Deficits on evaluation Righting Reactions: increased right lean with LOB secondary to pushing tendencies  Balance Balance Balance  Assessed: Yes Static Sitting Balance Static Sitting - Balance Support: Left upper extremity supported Static Sitting - Level of Assistance: 4: Min assist;3: Mod assist Dynamic Sitting Balance Dynamic Sitting - Balance Support: Feet supported;During functional activity Dynamic Sitting - Level of Assistance: 2: Max assist Static Standing Balance Static Standing - Balance Support: No upper extremity supported Static Standing - Level of Assistance: 1: +1 Total assist Static Standing - Comment/# of Minutes: RLE blocked Extremity Assessment      RLE Assessment RLE Assessment: Exceptions to [facility] General Strength Comments: grossly 3/5 in synergy LLE Assessment LLE Assessment: Within Functional Limits  Care Tool Care Tool Bed Mobility Roll left and right activity   Roll left and right assist level: Maximal Assistance - Patient 25 - 49%    Sit to lying activity   Sit to lying assist level: Total Assistance - Patient < 25%    Lying to sitting edge of bed activity   Lying to sitting edge of bed assist level: Total Assistance - Patient < 25%     Care Tool Transfers Sit to stand transfer   Sit to stand assist level: Total Assistance - Patient < 25%    Chair/bed transfer   Chair/bed transfer assist level: Dependent - Librarian, academic transfer activity did not occur: Safety/medical concerns        Care Tool Locomotion Ambulation Ambulation activity did not occur: Safety/medical concerns        Walk 10 feet activity Walk 10 feet activity did not occur: Safety/medical concerns       Walk 50 feet with 2 turns activity Walk 50 feet with 2 turns activity did not occur: Safety/medical concerns      Walk 150 feet activity Walk 150 feet activity did not occur: Safety/medical concerns      Walk 10 feet on uneven surfaces activity Walk 10 feet on uneven surfaces activity did not occur: Safety/medical concerns      Stairs Stair activity  did not occur: Safety/medical concerns        Walk up/down 1 step activity Walk up/down 1 step or curb (drop down) activity did not occur: Safety/medical concerns     Walk up/down 4 steps activity did not occuR: Safety/medical concerns  Walk up/down 4 steps activity      Walk up/down 12 steps activity Walk up/down 12 steps activity did not occur: Safety/medical concerns      Pick up small objects from floor Pick up small object from the floor (from standing position) activity did not occur: Safety/medical concerns      Wheelchair Will patient use wheelchair at discharge?: Yes     Wheelchair assist level: Total Assistance -  Patient < 25% Max wheelchair distance: 50  Wheel 50 feet with 2 turns activity   Assist Level: Total Assistance - Patient < 25%  Wheel 150 feet activity   Assist Level: Total Assistance - Patient < 25%    Refer to Care Plan for Long Term Goals  SHORT TERM GOAL WEEK 1 PT Short Term Goal 1 (Week 1): Pt will perform bed mobility with mod assist PT Short Term Goal 2 (Week 1): Pt will transfer to [facility] with mod Assist on SB PT Short Term Goal 3 (Week 1): pt will ambulate 65ft with max A +2 at rail in hall PT Short Term Goal 4 (Week 1): Pt will propell WC 98ft with mod assist  Recommendations for other services: None   Skilled Therapeutic Intervention  Pt received supine in bed and agreeable to PT. Supine>sit transfer with total  assist and max cues for motor planning. Stedy transfer to [facility]. PT instructed patient in PT Evaluation and initiated treatment intervention; see above for results. PT educated patient in [place], rehab potential, rehab goals, and discharge recommendations along with recommendation for follow-up rehabilitation services. WC mobility as listed below. SB transfer to mat table in gym with max assist and max cues for safety and min-mod assist sitting EOB. SB transfer to the R with max assist and RLE blocked. Stedy transfer to 18x18 WC with max assist.  Patient returned to room and left sitting in [facility] with call bell in reach and all needs met.        Mobility Bed Mobility Bed Mobility: Supine to Sit;Sit to Supine;Rolling Right;Rolling Left Rolling Right: Moderate Assistance - Patient 50-74% Rolling Left: Maximal Assistance - Patient 25-49% Supine to Sit: Total Assistance - Patient < 25%;Maximal Assistance - Patient - Patient 25-49% Sit to Supine: Maximal Assistance - Patient 25-49%;Total Assistance - Patient < 25% Transfers Transfers: Sit to Stand;Stand to Sit;Transfer via [place] to Stand: Total Assistance - Patient < 25% Stand to Sit: Total Assistance - Patient < 25% Transfer (Assistive device):  (parallel bars) Transfer via Lift Equipment: Probation officer Ambulation: Yes Gait Assistance: Total Assistance - Patient < 25% Gait Distance (Feet): 1 Feet Assistive device: Parallel bars Gait Gait: Yes Gait Pattern: Impaired Gait Pattern: Lateral trunk lean to right;Narrow base of support;Right flexed knee in stance Stairs / Additional Locomotion Stairs: No Wheelchair Mobility Wheelchair Mobility: Yes Wheelchair Assistance: Total Assistance - Patient <25% Wheelchair Propulsion: Right upper extremity Wheelchair Parts Management: Needs assistance Distance: 50   Discharge Criteria: Patient will be discharged from PT if patient refuses treatment 3 consecutive times without medical reason, if treatment goals not met, if there is a change in medical status, if patient makes no progress towards goals or if patient is discharged from hospital.  The above assessment, treatment plan, treatment alternatives and goals were discussed and mutually agreed upon: by patient  Lorie Phenix 04/15/2021, 5:59 AM

## 2021-04-14 NOTE — Evaluation (Signed)
Speech Language Pathology Assessment and Plan  Patient Details  Name: Jade [name] MRN: [id number] Date of Birth: 1955/09/12  SLP Diagnosis: Cognitive Impairments  Rehab Potential: Good ELOS: 21-28 days   Today's Date: 04/14/2021 SLP Individual Time: 1100-1200 SLP Individual Time Calculation (min): 13 min  Hospital Problem: Principal Problem:   CVA (cerebral vascular accident) [facility])  Past Medical History:  Past Medical History:  Diagnosis Date   Arthritis    "back, knees, some in my left hip" (03/21/2018)   Chronic kidney disease    "was told I had 25% kidney function in early 2012" (03/21/2018)   COPD (chronic obstructive pulmonary disease) ([place])    CVA (cerebral vascular accident) ([place]) 03/21/2018   "numb all over; head to toe" (03/21/2018)   Fibromyalgia    History of gout    Hyperlipidemia    Hypertension    Migraine    "used to get them monthly during menopause" (03/21/2018)   Neuromuscular disorder ([place])    OSA (obstructive sleep apnea)    "don't tolerate the machine" (03/21/2018)   Pneumonia ~ 2007   Type 2 diabetes, diet controlled ([place])    Past Surgical History:  Past Surgical History:  Procedure Laterality Date   HERNIA REPAIR     LAPAROSCOPIC CHOLECYSTECTOMY  06/2007   "no UHR w/this" (03/21/2018)   TONSILLECTOMY     TRANSCAROTID ARTERY REVASCULARIZATION  Left 04/06/2021   Procedure: LEFT TRANSCAROTID ARTERY REVASCULARIZATION;  Surgeon: Cherre Robins, MD;  Location: [place];  Service: Vascular;  Laterality: Left;   ULTRASOUND GUIDANCE FOR VASCULAR ACCESS Right 04/06/2021   Procedure: ULTRASOUND GUIDANCE FOR VASCULAR ACCESS;  Surgeon: Cherre Robins, MD;  Location: [place];  Service: Vascular;  Laterality: Right;   UMBILICAL HERNIA REPAIR  2009    Assessment / Plan / Recommendation Clinical Impression Jade [name] is a 65 year old right-handed female with history significant for CVA, COPD, CKD stage III hypertension, hypertension, OSA, diabetes  mellitus.  Recent admission 03/05/2021 to 03/14/2021 for CVA/left inferior thalamic maintained on aspirin and Plavix maintained on aspirin and Plavix.  Per chart review patient lives with spouse.  Modified independent with rolling walker.  1 level home 4 steps to entry.  Presented 04/01/2021 with right side weakness.  CT/MRI showed small acute infarcts in the left frontal parietal junction.  Additional small focus of acute ischemia adjacent to the frontal horn of the left lateral ventricle with petechial hemorrhage.  Multiple old small vessel infarct and findings of chronic ischemic microangiopathy.  CT angiogram of head and neck severe left ICA origin stenosis due to calcified plaque, over 80%.  40% narrowing at the right ICA origin.  High-grade left M3 branch stenosis which could be related to atherosclerosis or recent embolic disease. Tolerating a regular consistency diet.  Therapy evaluations completed due to patient's right side weakness decreased functional mobility was admitted for a comprehensive rehab program.  Results of SLP evaluation suggest mild cognitive deficits characterized by decreased selective attention, complex problem solving, and pre-morbid visual deficits. Susect patient is near baseline level of function. Pt endorsed decreased recall at baseline likely as result of previous CVAs with minimal compensations. Per report, new challenges include decreased processing speed and increased distractibility to external stimuli which was observed by clinician. [employer] Mental Status (SLUMS) was administered revealing score 22/26 (mdified score which omitted clock drawing subtest 2/2 R hemiparesis which is pt's dominant hand). Auditory comprehension and verbal expression appear grossly intact. Motor speech production was clear without indication  of dysarthria. Patient exhibited occasional hesitations impacting fluency however this occurred rarely and did not impact intelligibility or  effectiveness. Patient is tolerating a regular texture diet and thin liquids without difficulty per report. She was observed consuming single and sequential straw sips of thin liquids without s/sx of aspiration and clear vocal quality throughout. Continue current diet as tolerated. Patient would benefit from skilled SLP intervention to maximize cognitive-linguistic function and functional independence prior to discharge. See SLUMS results below.   Orientation: 3/3 Word list recall: 3/5 (5/5 w/ semantic cue) Calculations: 3/3 Generative Naming: 2/3 Mental manipulation/digit reversal: 1/2 Clock Drawing/Executive Function: omitted Visuospatial: 2/2 Paragraph Recall: 8/8   Skilled Therapeutic Interventions          Slp administered cognitive-linguistic, speech,  language assessments. Please see above.   SLP Assessment  Patient will need skilled [place] [facility] during CIR admission    Recommendations  Patient destination: Home Follow up Recommendations: Other (comment) (none anticipated although TBD) Equipment Recommended: None recommended by SLP    SLP Frequency 1 to 3 out of 7 days   SLP Duration  SLP Intensity  SLP Treatment/Interventions 21-28 days  Minumum of 1-2 x/day, 30 to 90 minutes  Cognitive remediation/compensation;Environmental controls;Internal/external aids;Cueing hierarchy;Functional tasks;Medication managment;Patient/family education    Pain - no pain   Prior Functioning    SLP Evaluation Cognition Overall Cognitive Status: Impaired/Different from baseline (patient endorsed decreased memory at baseline) Arousal/Alertness: Awake/alert Orientation Level: Oriented X4 Attention: Sustained;Selective;Focused Focused Attention: Appears intact Sustained Attention: Appears intact Selective Attention: Impaired Selective Attention Impairment: Functional basic;Verbal basic Memory: Impaired Memory Impairment: Storage deficit;Decreased recall of new  information (patient endorsed decreased memory at baseline) Awareness: Appears intact Problem Solving: Impaired Problem Solving Impairment: Verbal complex Safety/Judgment: Appears intact  Comprehension Auditory Comprehension Overall Auditory Comprehension: Appears within functional limits for tasks assessed Expression Expression Primary Mode of Expression: Verbal Verbal Expression Overall Verbal Expression: Appears within functional limits for tasks assessed Non-Verbal Means of Communication: Not applicable Written Expression Dominant Hand: Right Written Expression: Not tested Oral Motor Oral Motor/Sensory Function Overall Oral Motor/Sensory Function: Within functional limits Facial ROM: Reduced left Facial Symmetry: Abnormal symmetry left Facial Strength: Reduced left Lingual ROM: Within Functional Limits Lingual Symmetry: Within Functional Limits Lingual Strength: Within Functional Limits Mandible: Within Functional Limits Motor Speech Overall Motor Speech: Appears within functional limits for tasks assessed Respiration: Within functional limits Phonation: Normal Resonance: Within functional limits Articulation: Within functional limitis Intelligibility: Intelligible Motor Planning: Witnin functional limits Motor Speech Errors: Not applicable  Care Tool Care Tool Cognition Expression of Ideas and Wants Expression of Ideas and Wants: Some difficulty - exhibits some difficulty with expressing needs and ideas (e.g, some words or finishing thoughts) or speech is not clear   Understanding Verbal and Non-Verbal Content Understanding Verbal and Non-Verbal Content: Usually understands - understands most conversations, but misses some part/intent of message. Requires cues at times to understand   Memory/Recall Ability *first 3 days only Memory/Recall Ability *first 3 days only: That he or she is in a hospital/hospital unit;Current season    Short Term Goals: Week 1: SLP Short  Term Goal 1 (Week 1): Patient will recall and demonstrate use of at least 3 beneficial compensatory memory strategies with mod I SLP Short Term Goal 2 (Week 1): Patient will demonstrate selective attention within a mildly distracting environment with sup A verbal cues to redirect SLP Short Term Goal 3 (Week 1): Patient will complete complex problem solving with sup A verbal cues  Refer  to Care Plan for Long Term Goals  Recommendations for other services: None   Discharge Criteria: Patient will be discharged from SLP if patient refuses treatment 3 consecutive times without medical reason, if treatment goals not met, if there is a change in medical status, if patient makes no progress towards goals or if patient is discharged from hospital.  The above assessment, treatment plan, treatment alternatives and goals were discussed and mutually agreed upon: by patient  Patty Sermons 04/14/2021, 4:57 PM

## 2021-04-14 NOTE — Progress Notes (Signed)
PROGRESS NOTE   Subjective/Complaints:  Working with OT, pushing to Right  ROS- neg CP , SOB, N/V/D Objective:   No results found. Recent Labs    04/13/21 0205 04/14/21 0716  WBC 13.8* 10.1  HGB 13.6 13.4  HCT 40.0 39.5  PLT 363 351   Recent Labs    04/13/21 0205 04/14/21 0716  NA 136 136  K 4.0 3.9  CL 100 102  CO2 25 23  GLUCOSE 143* 140*  BUN 27* 33*  CREATININE 1.49* 1.36*  CALCIUM 10.1 9.6    Intake/Output Summary (Last 24 hours) at 04/14/2021 0837 Last data filed at 04/14/2021 0543 Gross per 24 hour  Intake 660 ml  Output 1000 ml  Net -340 ml        Physical Exam: Vital Signs Blood pressure (!) 148/79, pulse 74, temperature 97.6 F (36.4 C), temperature source Oral, resp. rate 18, height 5\' 1"  (1.549 m), weight 76.2 kg, SpO2 100 %.   General: No acute distress Mood and affect are appropriate Heart: Regular rate and rhythm no rubs murmurs or extra sounds Lungs: Clear to auscultation, breathing unlabored, no rales or wheezes Abdomen: Positive bowel sounds, soft nontender to palpation, nondistended Extremities: No clubbing, cyanosis, or edema Skin: No evidence of breakdown, no evidence of rash Neurologic: Cranial nerves II through XII intact, motor strength is 5/5 in left deltoid, bicep, tricep, grip, hip flexor, knee extensors, ankle dorsiflexor and plantar flexor Trace Right shoulder retraction and knee ext o/w 0 Sensory exam normal sensation to light touch and proprioception in bilateral upper and lower extremities  Musculoskeletal: Full range of motion in all 4 extremities. No joint swelling   Assessment/Plan: 1. Functional deficits which require 3+ hours per day of interdisciplinary therapy in a comprehensive inpatient rehab setting. Physiatrist is providing close team supervision and 24 hour management of active medical problems listed below. Physiatrist and rehab team continue to assess  barriers to discharge/monitor patient progress toward functional and medical goals  Care Tool:  Bathing              Bathing assist       Upper Body Dressing/Undressing Upper body dressing        Upper body assist Assist Level: Moderate Assistance - Patient 50 - 74%    Lower Body Dressing/Undressing Lower body dressing            Lower body assist       Toileting Toileting    Toileting assist Assist for toileting: Maximal Assistance - Patient 25 - 49% (purewick)     Transfers Chair/bed transfer  Transfers assist           Locomotion Ambulation   Ambulation assist              Walk 10 feet activity   Assist           Walk 50 feet activity   Assist           Walk 150 feet activity   Assist           Walk 10 feet on uneven surface  activity   Assist  Wheelchair     Assist               Wheelchair 50 feet with 2 turns activity    Assist            Wheelchair 150 feet activity     Assist          Blood pressure (!) 148/79, pulse 74, temperature 97.6 F (36.4 C), temperature source Oral, resp. rate 18, height 5\' 1"  (1.549 m), weight 76.2 kg, SpO2 100 %.  Medical Problem List and Plan: 1.  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  Antithrombotics: -DVT/anticoagulation: Subcutaneous heparin              -antiplatelet therapy: Aspirin 325 mg daily and Plavix 75 mg daily x3 months then Plavix alone 3. Pain Management: Neurontin 200 mg nightly, oxycodone as needed, Flexeril as needed 4. Mood: Provide emotional support             -antipsychotic agents: N/A 5. Neuropsych: This patient is capable of  making decisions on her own behalf. 6. Skin/Wound Care: Routine skin checks 7. Fluids/Electrolytes/Nutrition: Routine and outs with follow-up chemistries ordered 8.  Symptomatic left ICA stenosis.  Status post left transcarotid artery revascularization 04/06/2021.  Vascular surgery following 9.  Hypertension.  Norvasc 10 mg daily, HCTZ 12.5 mg daily.  Monitor with increased mobility Vitals:   04/13/21 2313 04/14/21 0410  BP: 139/78 (!) 148/79  Pulse: 82 74  Resp: 18 18  Temp:  97.6 F (36.4 C)  SpO2:  100%    10.  Diabetes mellitus.  Hemoglobin A1c 6.5.  SSI.             -monitor CBG's AC/HS CBG (last 3)  Recent Labs    04/13/21 2252 04/14/21 0559 04/14/21 0736  GLUCAP 177* 158* 136*   11.  Hyperlipidemia.  Lipitor 12.  GERD.  Protonix 13.  CKD stage III.  Baseline creatinine 1.18-1.34.  Follow-up chemistries 14.  Obesity.  BMI 31.74.  Dietary follow-up 15.  COPD.  Quit smoking 33 years ago.  Check oxygen saturations every shift              -oxygen per Bowers at 2L currently    LOS: 1 days A FACE TO FACE EVALUATION WAS PERFORMED  04/16/21 04/14/2021, 8:37 AM

## 2021-04-14 NOTE — Progress Notes (Addendum)
Patient ID: Jade [name], female   DOB: [birth year], 65 y.o.   MRN: [id number] Met with the patient to introduce self and the role of the nurse CM. Reviewed health history and secondary stroke risks including HTN, HLD (LDL 64) and DM (A1C 6.5), OSA and migraines. PAtient is very familiar with HH/CMM diet and uses [place] salt with stevia with limited use of processed foods. Patient also noted understanding of medications. Discussed ASA + Plavix x 3 mths then Plavix solo per MD. Continue to follow along to discharge to address educational needs. Margarito Liner

## 2021-04-14 NOTE — Progress Notes (Signed)
Inpatient Rehabilitation Care Coordinator Assessment and Plan Patient Details  Name: Jade [name] MRN: [id number] Date of Birth: 07/26/1956  Today's Date: 04/14/2021  Hospital Problems: Principal Problem:   CVA (cerebral vascular accident) [facility])  Past Medical History:  Past Medical History:  Diagnosis Date   Arthritis    "back, knees, some in my left hip" (03/21/2018)   Chronic kidney disease    "was told I had 25% kidney function in early 2012" (03/21/2018)   COPD (chronic obstructive pulmonary disease) ([place])    CVA (cerebral vascular accident) ([place]) 03/21/2018   "numb all over; head to toe" (03/21/2018)   Fibromyalgia    History of gout    Hyperlipidemia    Hypertension    Migraine    "used to get them monthly during menopause" (03/21/2018)   Neuromuscular disorder ([place])    OSA (obstructive sleep apnea)    "don't tolerate the machine" (03/21/2018)   Pneumonia ~ 2007   Type 2 diabetes, diet controlled ([place])    Past Surgical History:  Past Surgical History:  Procedure Laterality Date   HERNIA REPAIR     LAPAROSCOPIC CHOLECYSTECTOMY  06/2007   "no UHR w/this" (03/21/2018)   TONSILLECTOMY     TRANSCAROTID ARTERY REVASCULARIZATION  Left 04/06/2021   Procedure: LEFT TRANSCAROTID ARTERY REVASCULARIZATION;  Surgeon: Cherre Robins, MD;  Location: [place];  Service: Vascular;  Laterality: Left;   ULTRASOUND GUIDANCE FOR VASCULAR ACCESS Right 04/06/2021   Procedure: ULTRASOUND GUIDANCE FOR VASCULAR ACCESS;  Surgeon: Cherre Robins, MD;  Location: [place];  Service: Vascular;  Laterality: Right;   UMBILICAL HERNIA REPAIR  2009   Social History:  reports that she quit smoking about 33 years ago. Her smoking use included cigarettes. She has a 20.00 pack-year smoking history. She has never used smokeless tobacco. She reports that she does not currently use alcohol. She reports that she does not currently use drugs after having used the following drugs: Marijuana.  Family / Support  Systems Marital Status: Married How Long?: 20 years Patient Roles: Spouse, Parent Spouse/Significant Other: Jose (husband) Children: 1 adult son- Corene Cornea Other Supports: none reported Anticipated Caregiver: son Corene Cornea Ability/Limitations of Caregiver: Husband works during the day and not able to provide 24/7 care/ Pt son Corene Cornea will provide son at this time since he is not working. Caregiver Availability: 24/7 Family Dynamics: Pt lives with her husband [place].  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  Patient / Family Perceptions, Expectations & Goals Pt/Family understanding of illness & functional limitations: Pt has a general understanding of her care needs Premorbid pt/family roles/activities: Independent Anticipated changes in roles/activities/participation: Assistance with ADLs/IADLs Pt/family expectations/goals: Pt goal is to get better and be able to to things on my own, especially gardening.  Community Resources [employer] Scripts: None Premorbid Home Care/DME Agencies: None Transportation available at  discharge: son Banker referrals  recommended: Neuropsychology  Discharge Planning Living Arrangements: Spouse/significant other Support Systems: Spouse/significant other, Children Type of Residence: Private residence Insurance Resources: Multimedia programmer (specify) ([place] Medicare) Museum/gallery curator Resources: Employment Museum/gallery curator Screen Referred: No Living Expenses: Medical laboratory scientific officer Management: Spouse Does the patient have any problems obtaining your medications?: No Home Management: pt reports her husband manages all home care needs Patient/Family Preliminary Plans: No changes Care Coordinator Barriers to Discharge: Decreased caregiver support, Lack of/limited family support Care Coordinator Anticipated Follow Up Needs: HH/OP  Clinical Impression SW met with pt in room to introduce self, explain role and discuss discharge process. Pt is not a English as a second language teacher. No HCPOA in place. Would like for it to be here son. Requested to complete here in hospital. DME: cane and RW. Pt is aware primary SW Margreta Journey will f/u. Pt is aware SW will call her son to inform as well.   SW informed pt assigned RN on [facility] consult request with chaplain. SW provided pt with HCPOA/living will booklet.   1619- SW left message for pt son Corene Cornea to introduce self, explain role, discuss discharge process, and inform primary SW Margreta Journey will f/u upon her return. SW encouraged f/u if needed.   Ericca Labra A Adrienna Karis 04/14/2021, 4:26 PM

## 2021-04-14 NOTE — Plan of Care (Signed)
  Problem: RH Problem Solving Goal: LTG Patient will demonstrate problem solving for (SLP) Description: LTG:  Patient will demonstrate problem solving for basic/complex daily situations with cues  (SLP) Flowsheets (Taken 04/14/2021 1658) LTG: Patient will demonstrate problem solving for (SLP): Complex daily situations LTG Patient will demonstrate problem solving for: Modified Independent   Problem: RH Memory Goal: LTG Patient will use memory compensatory aids to (SLP) Description: LTG:  Patient will use memory compensatory aids to recall biographical/new, daily complex information with cues (SLP) Flowsheets (Taken 04/14/2021 1658) LTG: Patient will use memory compensatory aids to (SLP): Modified Independent   Problem: RH Attention Goal: LTG Patient will demonstrate this level of attention during functional activites (SLP) Description: LTG:  Patient will will demonstrate this level of attention during functional activites (SLP) Flowsheets (Taken 04/14/2021 1658) Patient will demonstrate during cognitive/linguistic activities the attention type of: Selective LTG: Patient will demonstrate this level of attention during cognitive/linguistic activities with assistance of (SLP): Modified Independent

## 2021-04-14 NOTE — Evaluation (Signed)
Occupational Therapy Assessment and Plan  Patient Details  Name: Jade [name] MRN: [id number] Date of Birth: 1955-12-08  OT Diagnosis: acute pain, altered mental status, cognitive deficits, disturbance of vision, hemiplegia affecting dominant side, and muscle weakness (generalized) Rehab Potential: Rehab Potential (ACUTE ONLY): Excellent ELOS: 21-24 days   Today's Date: 04/14/2021 OT Individual Time: 0800-0902 OT Individual Time Calculation (min): 62 min     Hospital Problem: Principal Problem:   CVA (cerebral vascular accident) [facility])   Past Medical History:  Past Medical History:  Diagnosis Date   Arthritis    "back, knees, some in my left hip" (03/21/2018)   Chronic kidney disease    "was told I had 25% kidney function in early 2012" (03/21/2018)   COPD (chronic obstructive pulmonary disease) (HCC)    CVA (cerebral vascular accident) ([place]) 03/21/2018   "numb all over; head to toe" (03/21/2018)   Fibromyalgia    History of gout    Hyperlipidemia    Hypertension    Migraine    "used to get them monthly during menopause" (03/21/2018)   Neuromuscular disorder ([place])    OSA (obstructive sleep apnea)    "don't tolerate the machine" (03/21/2018)   Pneumonia ~ 2007   Type 2 diabetes, diet controlled ([place])    Past Surgical History:  Past Surgical History:  Procedure Laterality Date   HERNIA REPAIR     LAPAROSCOPIC CHOLECYSTECTOMY  06/2007   "no UHR w/this" (03/21/2018)   TONSILLECTOMY     TRANSCAROTID ARTERY REVASCULARIZATION  Left 04/06/2021   Procedure: LEFT TRANSCAROTID ARTERY REVASCULARIZATION;  Surgeon: Cherre Robins, MD;  Location: [facility] OR;  Service: Vascular;  Laterality: Left;   ULTRASOUND GUIDANCE FOR VASCULAR ACCESS Right 04/06/2021   Procedure: ULTRASOUND GUIDANCE FOR VASCULAR ACCESS;  Surgeon: Cherre Robins, MD;  Location: [facility] OR;  Service: Vascular;  Laterality: Right;   UMBILICAL HERNIA REPAIR  2009    Assessment & Plan Clinical Impression: Patient is a 65 y.o. year old female with recent admission to the hospital on with .  Patient transferred to CIR on 04/13/2021 .    Patient currently requires total with basic self-care skills secondary to muscle weakness, muscle joint tightness, and muscle paralysis, impaired timing and sequencing, unbalanced muscle activation, decreased coordination, and decreased motor planning, decreased visual motor skills, field cut, and hemianopsia, decreased midline orientation, decreased memory, and decreased sitting balance, decreased standing balance, decreased postural control, hemiplegia, and decreased balance strategies.  Prior to hospitalization, patient could complete ADLs with modified independent .  Patient will benefit from skilled intervention to decrease level of assist with basic self-care skills and increase independence with basic self-care skills prior to discharge home with care partner.  Anticipate patient will require minimal physical assistance and follow up outpatient.  OT - End of Session Activity Tolerance: Decreased this session Endurance Deficit: Yes OT Assessment Rehab Potential (ACUTE ONLY): Excellent OT Barriers to Discharge: Decreased caregiver support OT Patient demonstrates impairments in the following area(s): Balance;Cognition;Endurance;Motor;Pain;Vision;Sensory;Perception;Safety OT Basic ADL's Functional Problem(s): Eating;Grooming;Bathing;Dressing OT Transfers Functional Problem(s): Toilet;Tub/Shower OT Additional Impairment(s): Fuctional Use of Upper Extremity OT Plan OT Intensity: Minimum of 1-2 x/day, 45 to 90 minutes OT Frequency: 5 out of 7 days OT Duration/Estimated Length of Stay: 21-24 days OT Treatment/Interventions: Balance/vestibular training;Discharge planning;Functional electrical stimulation;Pain management;Self Care/advanced ADL retraining;UE/LE Coordination activities;Therapeutic Activities;Therapeutic Exercise;Visual/perceptual remediation/compensation;Skin care/wound  managment;Patient/family education;Functional mobility training;Disease mangement/prevention;Cognitive remediation/compensation;DME/adaptive equipment instruction;Neuromuscular re-education;Psychosocial support;Splinting/orthotics;UE/LE Strength taining/ROM;Wheelchair propulsion/positioning;Community reintegration OT Self Feeding Anticipated Outcome(s): modified independent Lexicographer Anticipated  Outcome(s): min assist OT Toileting Anticipated Outcome(s): min assist OT Bathroom Transfers Anticipated Outcome(s): min assist OT Recommendation Recommendations for Other Services: Therapeutic Recreation consult Therapeutic Recreation Interventions: Stress management Patient destination: Home Follow Up Recommendations: 24 hour supervision/assistance Equipment Recommended: To be determined   OT Evaluation Precautions/Restrictions  Precautions Precautions: Fall Precaution Comments: RUE hemparesis with increased tone, diplopia Restrictions Weight Bearing Restrictions: No Therapy Vitals Temp: (!) 97.5 F (36.4 C) Temp Source: Oral Pulse Rate: 93 Resp: 16 BP: (!) 159/93 Patient Position (if appropriate): Sitting Oxygen Therapy SpO2: 99 % O2 Device: Room Air Pain  No report of pain during session.  Home Living/Prior [place] expects to be discharged to:: Private residence Living Arrangements: Spouse/significant other Available Help at Discharge: Family, Available PRN/intermittently Type of Home: House Home Access: Stairs to enter [employer] of Steps: 1 threshold Entrance Stairs-Rails: None Home Layout: One level Bathroom Shower/Tub: Chiropodist: Handicapped height Bathroom Accessibility: Yes Additional Comments: Husband works during the day, son is supposed to provide assist while he is at work.  Lives With: Spouse IADL History Homemaking Responsibilities: Yes Meal Prep Responsibility: Secondary Laundry  Responsibility: Secondary Prior Function Level of Independence: Requires assistive device for independence, Needs assistance with ADLs, Needs assistance with homemaking  Able to Take Stairs?: Yes Driving: No Vocation: On disability Comments: doesn't drive Vision Baseline Vision/History: Wears glasses Wears Glasses: At all times Patient Visual Report: Diplopia (double vision) Vision Assessment?: Yes;Vision impaired- to be further tested in functional context Perception  Perception: Impaired Spatial Orientation: increase pusher tendencies to the right in sitting and standing Praxis Praxis: Impaired Praxis Impairment Details: Motor planning Praxis-Other Comments: motor impersistence Cognition Overall Cognitive Status: Impaired/Different from baseline Arousal/Alertness: Awake/alert Orientation Level: Person;Place;Situation Person: Oriented Place: Oriented Situation: Oriented Year: 2022 Month: August Day of Week: Correct Memory: Impaired Memory Impairment: Storage deficit;Decreased recall of new information Immediate Memory Recall: Sock;Blue;Bed Memory Recall Sock: Without Cue Memory Recall Blue: With Cue Memory Recall Bed: Not able to recall Attention: Sustained;Selective;Focused Focused Attention: Appears intact Sustained Attention: Appears intact Selective Attention: Impaired Selective Attention Impairment: Functional basic Awareness: Appears intact Problem Solving: Appears intact Safety/Judgment: Appears intact Sensation Sensation Light Touch: Appears Intact Hot/Cold: Appears Intact Proprioception: Not tested Stereognosis: Not tested Additional Comments: Light touch intact in RUE with gross testing. Coordination Gross Motor Movements are Fluid and Coordinated: No Fine Motor Movements are Fluid and Coordinated: No Coordination and Movement Description: Pt with Brunnstrum stage II movement in the right arm and hand with flexor synergy and increased flexor tone noted  in the elbow and digits.  She needs max hand over hand to integrate into function. Motor  Motor Motor: Hemiplegia;Abnormal tone;Abnormal postural alignment and control;Motor impersistence Motor - Skilled Clinical Observations: RUE and RLE hemiparesis with increased flexor tone developing and pusher tendencies to the right.  Trunk/Postural Assessment  Cervical Assessment Cervical Assessment: Exceptions to [facility] (cervical protraction and flexion) Thoracic Assessment Thoracic Assessment: Exceptions to [facility] (thoracic rounding with right lean) Lumbar Assessment Lumbar Assessment: Exceptions to [facility] (posterior pelvic tilt) Postural Control Postural Control: Deficits on evaluation Righting Reactions: increased right lean with LOB secondary to pushing tendencies  Balance Balance Balance Assessed: Yes Static Sitting Balance Static Sitting - Balance Support: Left upper extremity supported Static Sitting - Level of Assistance: 4: Min assist Dynamic Sitting Balance Dynamic Sitting - Balance Support: Feet supported;During functional activity Dynamic Sitting - Level of Assistance: 2: Max assist Static Standing Balance Static Standing - Balance Support: No upper extremity  supported Static Standing - Level of Assistance: 2: Max assist;1: +1 Total assist Extremity/Trunk Assessment RUE Assessment RUE Assessment: Exceptions to [facility] Passive Range of Motion (PROM) Comments: shoulder flexion 0- 150 degrees with some glenohumeral pain,  elbow flexion/extension WFLS with slight flexor tone developing.  PROM in digits WFLS with tone as well Active Range of Motion (AROM) Comments: Brunnstrum stage II in the right arm and hand.  max hand over hand assist needed for integration into therapy as a stabilizer. RUE Tone RUE Tone: Moderate;Modified Ashworth Body Part - Modified Ashworth Scale: Fingers Fingers - Modified Ashworth Scale for Grading Hypertonia RUE: More marked increase in muscle tone through most of the  ROM, but affected part(s) easily moved Modified Ashworth Scale for Grading Hypertonia RUE: More marked increase in muscle tone through most of the ROM, but affected part(s) easily moved LUE Assessment LUE Assessment: Within Functional Limits  Care Tool Care Tool Self Care Eating   Eating Assist Level: Set up assist    Oral Care    Oral Care Assist Level: Supervision/Verbal cueing    Bathing   Body parts bathed by patient: Chest;Abdomen;Right upper leg;Left upper leg;Face Body parts bathed by helper: Front perineal area;Buttocks;Right lower leg;Left lower leg   Assist Level: 2 Helpers    Upper Body Dressing(including orthotics)   What is the patient wearing?: Pull over shirt   Assist Level: Maximal Assistance - Patient 25 - 49%    Lower Body Dressing (excluding footwear)   What is the patient wearing?: Pants;Incontinence brief Assist for lower body dressing: 2 Helpers    Putting on/Taking off footwear   What is the patient wearing?: Ted hose;Non-skid slipper socks Assist for footwear: Dependent - Patient 0%       Care Tool Toileting Toileting activity   Assist for toileting: 2 Helpers     Care Tool Bed Mobility Roll left and right activity        Sit to lying activity   Sit to lying assist level: Maximal Assistance - Patient 25 - 49%    Lying to sitting edge of bed activity   Lying to sitting edge of bed assist level: Total Assistance - Patient < 25%     Care Tool Transfers Sit to stand transfer   Sit to stand assist level: 2 Helpers    Chair/bed transfer   Chair/bed transfer assist level: 2 Helpers     Toilet transfer   Assist Level: 2 Helpers     Care Tool Cognition Expression of Ideas and Wants Expression of Ideas and Wants: Some difficulty - exhibits some difficulty with expressing needs and ideas (e.g, some words or finishing thoughts) or speech is not clear   Understanding Verbal and Non-Verbal Content Understanding Verbal and Non-Verbal Content:  Usually understands - understands most conversations, but misses some part/intent of message. Requires cues at times to understand   Memory/Recall Ability *first 3 days only Memory/Recall Ability *first 3 days only: That he or she is in a hospital/hospital unit;Current season    Refer to Care Plan for Long Term Goals  SHORT TERM GOAL WEEK 1 OT Short Term Goal 1 (Week 1): Pt will maintain static sitting EOB with supervision for 3 mins in preparation for selfcare tasks. OT Short Term Goal 2 (Week 1): Pt will maintain dynamic sitting balance with min assist overall during completion of selfcare tasks. OT Short Term Goal 3 (Week 1): Pt will complete UB bathing with min assist for 2 consecutive sessions. OT Short Term Goal  4 (Week 1): Pt will complete LB bathing with max assist sit to stand.  Recommendations for other services: Therapeutic Recreation  Stress management   Skilled Therapeutic Intervention ADL ADL Eating: Supervision/safety Where Assessed-Eating: Bed level Grooming: Moderate assistance Where Assessed-Grooming: Edge of bed Upper Body Bathing: Maximal assistance Where Assessed-Upper Body Bathing: Edge of bed Lower Body Bathing: Dependent Where Assessed-Lower Body Bathing: Bed level;Edge of bed Upper Body Dressing: Maximal assistance Where Assessed-Upper Body Dressing: Edge of bed Lower Body Dressing: Dependent Where Assessed-Lower Body Dressing: Bed level;Edge of bed Toileting: Dependent Where Assessed-Toileting: Bedside Commode Toilet Transfer: Dependent Toilet Transfer Method: Stand pivot Toilet Transfer Equipment: Drop arm bedside commode Tub/Shower Transfer: Not assessed Social research officer, government: Not assessed Mobility  Bed Mobility Bed Mobility: Supine to Sit;Sit to Supine Supine to Sit: Total Assistance - Patient < 25% Sit to Supine: Maximal Assistance - Patient 25-49% Transfers Sit to Stand: 2 Helpers Stand to Sit: 2 Helpers  Session Note:  Pt worked on  bathing and dressing sit to stand from the EOB.  Total assist for transition to sitting with mod assist for static sitting balance progressing to max assist secondary to pusher tendencies to the right.  Max assist for UB bathing and total assist +2 (pt 30%) for LB bathing sit to stand.  Pusher tendencies to the right increased during standing compared to sitting.  Increased flexor tone noted in the right hand as well as the elbow.  Needs max assist for integration of the RUE into bathing.  Max assist for UB dressing as well with total assist +2 for LB.  Diplopia reported with far vision when supine in the bed.  She reported less diplopia with scanning right of midline but did not get to formally assess.  Finished session with pt transferring back to supine after dressing in order to rest.    Discharge Criteria: Patient will be discharged from OT if patient refuses treatment 3 consecutive times without medical reason, if treatment goals not met, if there is a change in medical status, if patient makes no progress towards goals or if patient is discharged from hospital.  The above assessment, treatment plan, treatment alternatives and goals were discussed and mutually agreed upon: by patient  Yanci Bachtell OTR/L 04/14/2021, 4:22 PM

## 2021-04-14 NOTE — Progress Notes (Signed)
Inpatient Rehabilitation  Patient information reviewed and entered into eRehab system by Wilson Sample Anaston Koehn, OTR/L.   Information including medical coding, functional ability and quality indicators will be reviewed and updated through discharge.    

## 2021-04-14 NOTE — IPOC Note (Addendum)
Overall Plan of Care [facility]) Patient Details Name: Jade [name] MRN: [id number] DOB: 06/23/1956  Admitting Diagnosis: CVA (cerebral vascular accident) [facility])  Hospital Problems: Principal Problem:   CVA (cerebral vascular accident) [facility])     Functional Problem List: Nursing Bowel, Endurance, Medication Management, Safety  PT Balance, Skin Integrity, Behavior, Edema, Motor, Endurance, Nutrition, Pain, Sensory, Perception, Safety  OT Balance, Cognition, Endurance, Motor, Pain, Vision, Sensory, Perception, Safety  SLP Cognition  TR         Basic ADL's: OT Eating, Grooming, Bathing, Dressing     Advanced  ADL's: OT       Transfers: PT Bed Mobility, Car, Bed to Chair, [employer], Floor  OT Toilet, Tub/Shower     Locomotion: PT Ambulation, Psychologist, prison and probation services, Stairs     Additional Impairments: OT Fuctional Use of Upper Extremity  SLP Social Cognition   Memory, Attention, Problem Solving  TR      Anticipated Outcomes Item Anticipated Outcome  Self Feeding modified independent  Swallowing      Basic self-care  min assist  Toileting  min assist   Bathroom Transfers min assist  Bowel/Bladder  manage bowel w mod I  Transfers  Min assist  Locomotion  min assist ambulation for house hold distances. supervision assist WC mobility  Communication     Cognition  mod I  Pain  n/a  Safety/Judgment  maintain safety w cues/reminders   Therapy Plan: PT Intensity: Minimum of 1-2 x/day ,45 to 90 minutes PT Frequency: 5 out of 7 days, Total of 15 hours over 7 days of combined therapies PT Duration Estimated Length of Stay: 26-30 days OT Intensity: Minimum of 1-2 x/day, 45 to 90 minutes OT Frequency: 5 out of 7 days OT Duration/Estimated Length of Stay: 21-24 days SLP Intensity: Minumum of 1-2 x/day, 30 to 90 minutes SLP Frequency: 1 to 3 out of 7 days SLP Duration/Estimated Length of Stay: 21-28 days   Due to the current state of emergency, patients may not be  receiving their 3-hours of Medicare-mandated therapy.   Team Interventions: Nursing Interventions Bowel Management, Patient/Family Education, Disease Management/Prevention, Medication Management, Discharge Planning  PT interventions Ambulation/gait training, Balance/vestibular training, Cognitive remediation/compensation, Community reintegration, Disease management/prevention, DME/adaptive equipment instruction, Discharge planning, Functional mobility training, Neuromuscular re-education, Functional electrical stimulation, Pain management, Patient/family education, Splinting/orthotics, Skin care/wound management, Psychosocial support, Stair training, Therapeutic Activities, Therapeutic Exercise, Visual/perceptual remediation/compensation, UE/LE Coordination activities, UE/LE Strength taining/ROM, Wheelchair propulsion/positioning  OT Interventions Balance/vestibular training, Discharge planning, Functional electrical stimulation, Pain management, Self Care/advanced ADL retraining, UE/LE Coordination activities, Therapeutic Activities, Therapeutic Exercise, Visual/perceptual remediation/compensation, Skin care/wound managment, Patient/family education, Functional mobility training, Disease mangement/prevention, Cognitive remediation/compensation, DME/adaptive equipment instruction, Neuromuscular re-education, Psychosocial support, Splinting/orthotics, UE/LE Strength taining/ROM, Wheelchair propulsion/positioning, Community reintegration  SLP Interventions Cognitive remediation/compensation, Environmental controls, Internal/external aids, Financial trader, Functional tasks, Medication managment, Patient/family education  TR Interventions    SW/CM Interventions Discharge Planning, Psychosocial Support, Patient/Family Education   Barriers to Discharge MD  Medical stability  Nursing Decreased caregiver support, Home environment access/layout 1 level 4ste home w spouse (non english speaking) son will assist   PT Decreased caregiver support, Home environment access/layout, Lack of/limited family support, Insurance for SNF coverage, Medication compliance    OT Decreased caregiver support    SLP      SW Decreased caregiver support, Lack of/limited family support     Team Discharge Planning: Destination: PT-Skilled Nursing Facility (SNF) ,OT- Home , SLP-Home Projected Follow-up: PT-Skilled nursing facility, OT-  24 hour supervision/assistance, SLP-Other (  comment) (none anticipated although TBD) Projected Equipment Needs: PT-To be determined, OT- To be determined, SLP-None recommended by SLP Equipment Details: PT- , OT-  Patient/family involved in discharge planning: PT- Patient,  OT-Patient, SLP-Patient  MD ELOS: 20-24d Medical Rehab Prognosis:  Good Assessment: 65 year old right-handed female with history significant for CVA, COPD, CKD stage III hypertension, hypertension, OSA, diabetes mellitus.  Recent admission 03/05/2021 to 03/14/2021 for CVA/left inferior thalamic maintained on aspirin and Plavix maintained on aspirin and Plavix.  Per chart review patient lives with spouse.  Modified independent with rolling walker.  1 level home 4 steps to entry.  Presented 04/01/2021 with right side weakness.  CT/MRI showed small acute infarcts in the left frontal parietal junction.  Additional small focus of acute ischemia adjacent to the frontal horn of the left lateral ventricle with petechial hemorrhage.  Multiple old small vessel infarct and findings of chronic ischemic microangiopathy.  CT angiogram of head and neck severe left ICA origin stenosis due to calcified plaque, over 80%.  40% narrowing at the right ICA origin.  High-grade left M3 branch stenosis which could be related to atherosclerosis or recent embolic disease.  Admission chemistries unremarkable except potassium 3.3, creatinine 0.96, urine drug screen negative.  Echocardiogram of 03/06/2021 showed ejection fraction of 55 to 60% grade 1 diastolic  dysfunction.  Vascular surgery follow-up in regards to severe left carotid artery stenosis underwent left transcarotid artery revascularization 04/06/2021 per Dr. Lenell Antu.  Currently maintained on full-strength aspirin 325 mg daily as well as Plavix for CVA prophylaxis.  Subcutaneous heparin for DVT prophylaxis x3 months then Plavix alone.  Tolerating a regular consistency diet.  Therapy evaluations completed due to patient's right side weakness decreased functional mobility was admitted for a comprehensive rehab program    See Team Conference Notes for weekly updates to the plan of care

## 2021-04-15 LAB — GLUCOSE, CAPILLARY
Glucose-Capillary: 172 mg/dL — ABNORMAL HIGH (ref 70–99)
Glucose-Capillary: 184 mg/dL — ABNORMAL HIGH (ref 70–99)
Glucose-Capillary: 206 mg/dL — ABNORMAL HIGH (ref 70–99)
Glucose-Capillary: 171 mg/dL — ABNORMAL HIGH (ref 70–99)

## 2021-04-15 NOTE — Progress Notes (Signed)
Physical Therapy Session Note  Patient Details  Name: Jade [name] MRN: [id number] Date of Birth: 26-Jan-1956  Today's Date: 04/15/2021 PT Individual Time: Session1: [phone]; [place]: [phone] PT Individual Time Calculation (min): 58 min & 42 min  Short Term Goals: Week 1:  PT Short Term Goal 1 (Week 1): Pt will perform bed mobility with mod assist PT Short Term Goal 2 (Week 1): Pt will transfer to [facility] with mod Assist on SB PT Short Term Goal 3 (Week 1): pt will ambulate 62ft with max A +2 at rail in hall PT Short Term Goal 4 (Week 1): Pt will propell WC 31ft with mod assist  Skilled Therapeutic Interventions/Progress Updates:  Session1:  Patient in supine and reports LE spasms and L UE pain, but prefers medication in evenings since it puts her to sleep.  Patient supine for L LE PROM/gentle stretching and tone inhibition techniques.  Rolled to L with mod A and side to sit with mod/max A.  Patient seated EOB with min to mod A for balance and transferred to w/c with slide board max A.  Patient in w/c assisted to therapy gym.  In parallel bars performs sit to stand with mod A blocking L knee and using rail to pull up.  Performed lateral weight shifts and pt c/o increased spasms L LE.  Patient performed another bout of standing in bars.  Transferred to mat with slide board and max A.  Seated EOM for assisted stretching into tall posture with posterior support, then trunk rotation to each side with A.  Performed reaching to L and R and to L to floor for cones using mirror for feedback to encourage anterior weight shift and body spatial awareness of midline.  Patient needing min to mod A for reaching activities and able to maintain midline only a few seconds prior to LOB posterior or to L.  Patient transferred to w/c with slide board and mod A.  Transported in w/c to room left in w/c with alarm belt active and all needs in reach.   Session2: Patient in supine and reports had shower, then had to  have in/out cath.  Performed supine to sit with mod A for trunk, scooting R hip.  Patient transferred to w/c slide board max A and cues for leaning L with over the back technique.  Patient assisted in w/c to gym.  Seated in chair for clothes pin activity working on sorting and L hand strength while PT obtained 20" wide w/c.  Patient transferred to mat with slide board and max A.  Seated EOM with min A for balance with cues and mirror for feedback to extend trunk without posterior lean.  Patient transferred to 20" w/c with slide board and mod to max A.  Improved comfort and ease of transfer with wider chair.  Patient assisted to room in chair.  Left with call bell in reach, 1/2 lap trap for R arm support and seat belt alarm active.  Therapy Documentation Precautions:  Precautions Precautions: Fall Precaution Comments: RUE hemparesis with increased tone, diplopia Restrictions Weight Bearing Restrictions: No  Pain: Pain Assessment Pain Score: 7  Pain Type: Acute pain Pain Location: Leg Pain Orientation: Right Pain Descriptors / Indicators: Cramping;Sore Pain Onset: On-going Pain Intervention(s): Repositioned;Rest    Therapy/Group: Individual Therapy  Elray Mcgregor Niles, PT 04/15/2021, 9:28 AM

## 2021-04-15 NOTE — Progress Notes (Signed)
Occupational Therapy Session Note  Patient Details  Name: Jade [name] MRN: [id number] Date of Birth: 1956-02-11  Today's Date: 04/15/2021 OT Individual Time: 1010-1100 OT Individual Time Calculation (min): 50 min    Short Term Goals: Week 1:  OT Short Term Goal 1 (Week 1): Pt will maintain static sitting EOB with supervision for 3 mins in preparation for selfcare tasks. OT Short Term Goal 2 (Week 1): Pt will maintain dynamic sitting balance with min assist overall during completion of selfcare tasks. OT Short Term Goal 3 (Week 1): Pt will complete UB bathing with min assist for 2 consecutive sessions. OT Short Term Goal 4 (Week 1): Pt will complete LB bathing with max assist sit to stand.  Skilled Therapeutic Interventions/Progress Updates:    1:1 Pt received from the w/c with c/o her right arm hurting and having a lot of spasms in her arm and leg. Pt agreed to try a therapeutic shower today. Used the STEDY to assist the patient into the bathroom. Pt required max to to come into standing position with the STEDY with multimodal cues for maintaining upright posture at midline and total A for management of right UE. Pt transported into the bathroom and to elevated BSC in shower. Focus on postural alignment at midline with awareness to self correct when falling to the right. Focus on how to effectively wash and care for right UE. On BSC pt able to access peri area to effectively bathe but required total A for buttocks. Pt did require total A for lower body. Max A to come into standing in the STEDY to transition to perched seat of sTEDY. Transitioned to the EOB to dress. Heavy focus on body awareness and maintaining static sitting balance. Pt with slow balance reactions when falling to the right and posteriorly requiring total multimodal cues and total A progressing to brief moments of min/mod A but difficulty sustaining them. Transitioned into supine once dressed in brief and gown with max A.    2nd session 130-200 pm  Pt still in bed when arrived. Pt reported wanted to try to go to the bathroom. Donned socks and shoes with total A in the bed. During bed mobility to come to EOB; promoted normal transitional movement with activation of right side. Pt required max A to assist with UE but was able ot active right LE and hip with min A. Mod A to come into sitting at EOB. Mod A to maintain sitting balance. Squat pivot transfer to drop arm BSC with max A- pt with difficulty with weight shift to the right to come onto the commode. Pt unable to void. Since pt has not voided since being cathed for this am  and felt like she had to void; requested nursing to do a bladder scan. During process of trying to scan her; focus on activation of right Ue in supine with  shoulder abduction, elbow flexion and extension in vertical and horizontal plane.   Left pt to complete cathing with nursing.   Therapy Documentation Precautions:  Precautions Precautions: Fall Precaution Comments: RUE hemparesis with increased tone, diplopia Restrictions Weight Bearing Restrictions: No  Pain: Pain Assessment Pain Scale:  (refused pain medication) Pain Score: 7  Pain Type: Acute pain Pain Location: Leg Pain Orientation: Right Pain Descriptors / Indicators: Cramping;Sore Pain Onset: On-going Pain Intervention(s): Repositioned;Rest  As well as right UE - assisted with repositioning of UE    Therapy/Group: Individual Therapy  [name] [facility] 04/15/2021, 12:53 PM

## 2021-04-15 NOTE — Plan of Care (Signed)
  Problem: RH Balance Goal: LTG Patient will maintain dynamic sitting balance (PT) Description: LTG:  Patient will maintain dynamic sitting balance with assistance during mobility activities (PT) Flowsheets (Taken 04/15/2021 0537) LTG: Pt will maintain dynamic sitting balance during mobility activities with:: Supervision/Verbal cueing Goal: LTG Patient will maintain dynamic standing balance (PT) Description: LTG:  Patient will maintain dynamic standing balance with assistance during mobility activities (PT) Flowsheets (Taken 04/15/2021 0537) LTG: Pt will maintain dynamic standing balance during mobility activities with:: Minimal Assistance - Patient > 75%   Problem: RH Bed Mobility Goal: LTG Patient will perform bed mobility with assist (PT) Description: LTG: Patient will perform bed mobility with assistance, with/without cues (PT). Flowsheets (Taken 04/15/2021 0537) LTG: Pt will perform bed mobility with assistance level of: Minimal Assistance - Patient > 75%   Problem: RH Bed to Chair Transfers Goal: LTG Patient will perform bed/chair transfers w/assist (PT) Description: LTG: Patient will perform bed to chair transfers with assistance (PT). Flowsheets (Taken 04/15/2021 0537) LTG: Pt will perform Bed to Chair Transfers with assistance level: Minimal Assistance - Patient > 75%   Problem: RH Car Transfers Goal: LTG Patient will perform car transfers with assist (PT) Description: LTG: Patient will perform car transfers with assistance (PT). Flowsheets (Taken 04/15/2021 0537) LTG: Pt will perform car transfers with assist:: Moderate Assistance - Patient 50 - 74%   Problem: RH Ambulation Goal: LTG Patient will ambulate in controlled environment (PT) Description: LTG: Patient will ambulate in a controlled environment, # of feet with assistance (PT). Flowsheets (Taken 04/15/2021 0537) LTG: Pt will ambulate in controlled environ  assist needed:: Minimal Assistance - Patient > 75% LTG: Ambulation  distance in controlled environment: 62ft with LRAD Goal: LTG Patient will ambulate in home environment (PT) Description: LTG: Patient will ambulate in home environment, # of feet with assistance (PT). Flowsheets (Taken 04/15/2021 0537) LTG: Pt will ambulate in home environ  assist needed:: Minimal Assistance - Patient > 75% LTG: Ambulation distance in home environment: 42ft with LRAD   Problem: RH Wheelchair Mobility Goal: LTG Patient will propel w/c in controlled environment (PT) Description: LTG: Patient will propel wheelchair in controlled environment, # of feet with assist (PT) Flowsheets (Taken 04/15/2021 0537) LTG: Pt will propel w/c in controlled environ  assist needed:: Supervision/Verbal cueing LTG: Propel w/c distance in controlled environment: 158ft Goal: LTG Patient will propel w/c in home environment (PT) Description: LTG: Patient will propel wheelchair in home environment, # of feet with assistance (PT). Flowsheets (Taken 04/15/2021 0537) LTG: Pt will propel w/c in home environ  assist needed:: Supervision/Verbal cueing LTG: Propel w/c distance in home environment: 67ft

## 2021-04-16 LAB — GLUCOSE, CAPILLARY
Glucose-Capillary: 120 mg/dL — ABNORMAL HIGH (ref 70–99)
Glucose-Capillary: 151 mg/dL — ABNORMAL HIGH (ref 70–99)
Glucose-Capillary: 167 mg/dL — ABNORMAL HIGH (ref 70–99)
Glucose-Capillary: 137 mg/dL — ABNORMAL HIGH (ref 70–99)

## 2021-04-16 MED ORDER — TAMSULOSIN HCL 0.4 MG PO CAPS
0.4000 mg | ORAL_CAPSULE | Freq: Every day | ORAL | Status: DC
  Administered 2021-04-16 – 2021-05-11 (×26): 0.4 mg via ORAL
  Filled 2021-04-16 (×26): qty 1

## 2021-04-16 MED ORDER — CHLORHEXIDINE GLUCONATE CLOTH 2 % EX PADS
6.0000 | MEDICATED_PAD | Freq: Every day | CUTANEOUS | Status: DC
  Administered 2021-04-16 – 2021-04-21 (×6): 6 via TOPICAL

## 2021-04-16 NOTE — Progress Notes (Signed)
Patient remains alert and oriented. New orders to insert foley catheter.  Tolerated well and amber color urine with no sediments noted.

## 2021-04-16 NOTE — Progress Notes (Signed)
PROGRESS NOTE   Subjective/Complaints: Pt has not voided spontaneously . I/O caths uncomfortable  Some back pain but no other c/os  ROS- neg CP , SOB, N/V/D Objective:   No results found. Recent Labs    04/14/21 0716  WBC 10.1  HGB 13.4  HCT 39.5  PLT 351    Recent Labs    04/14/21 0716  NA 136  K 3.9  CL 102  CO2 23  GLUCOSE 140*  BUN 33*  CREATININE 1.36*  CALCIUM 9.6     Intake/Output Summary (Last 24 hours) at 04/16/2021 0953 Last data filed at 04/16/2021 0837 Gross per 24 hour  Intake 560 ml  Output 750 ml  Net -190 ml         Physical Exam: Vital Signs Blood pressure 131/63, pulse 67, temperature 97.6 F (36.4 C), temperature source Oral, resp. rate 16, height 5\' 1"  (1.549 m), weight 76.2 kg, SpO2 99 %.  General: No acute distress Mood and affect are appropriate Heart: Regular rate and rhythm no rubs murmurs or extra sounds Lungs: Clear to auscultation, breathing unlabored, no rales or wheezes Abdomen: Positive bowel sounds, soft nontender to palpation, nondistended Extremities: No clubbing, cyanosis, or edema Skin: No evidence of breakdown, no evidence of rash   Neurologic: Cranial nerves II through XII intact, motor strength is 5/5 in left deltoid, bicep, tricep, grip, hip flexor, knee extensors, ankle dorsiflexor and plantar flexor Trace Right shoulder retraction and knee ext o/w 0 Sensory exam normal sensation to light touch and proprioception in bilateral upper and lower extremities  Musculoskeletal: Full range of motion in all 4 extremities. No joint swelling   Assessment/Plan: 1. Functional deficits which require 3+ hours per day of interdisciplinary therapy in a comprehensive inpatient rehab setting. Physiatrist is providing close team supervision and 24 hour management of active medical problems listed below. Physiatrist and rehab team continue to assess barriers to  discharge/monitor patient progress toward functional and medical goals  Care Tool:  Bathing    Body parts bathed by patient: Chest, Abdomen, Right upper leg, Left upper leg, Face, Front perineal area   Body parts bathed by helper: Buttocks, Right lower leg, Left lower leg     Bathing assist Assist Level: Maximal Assistance - Patient 24 - 49%     Upper Body Dressing/Undressing Upper body dressing   What is the patient wearing?: Hospital gown only    Upper body assist Assist Level: Maximal Assistance - Patient 25 - 49%    Lower Body Dressing/Undressing Lower body dressing      What is the patient wearing?: Pants, Incontinence brief     Lower body assist Assist for lower body dressing: Total Assistance - Patient < 25%     Toileting Toileting    Toileting assist Assist for toileting: Dependent - Patient 0%     Transfers Chair/bed transfer  Transfers assist     Chair/bed transfer assist level: Maximal Assistance - Patient 25 - 49%     Locomotion Ambulation   Ambulation assist   Ambulation activity did not occur: Safety/medical concerns          Walk 10 feet activity   Assist  Walk  10 feet activity did not occur: Safety/medical concerns        Walk 50 feet activity   Assist Walk 50 feet with 2 turns activity did not occur: Safety/medical concerns         Walk 150 feet activity   Assist Walk 150 feet activity did not occur: Safety/medical concerns         Walk 10 feet on uneven surface  activity   Assist Walk 10 feet on uneven surfaces activity did not occur: Safety/medical concerns         Wheelchair     Assist Will patient use wheelchair at discharge?: Yes      Wheelchair assist level: Dependent - Patient 0% Max wheelchair distance: 50    Wheelchair 50 feet with 2 turns activity    Assist        Assist Level: Dependent - Patient 0%   Wheelchair 150 feet activity     Assist      Assist Level:  Dependent - Patient 0%   Blood pressure 131/63, pulse 67, temperature 97.6 F (36.4 C), temperature source Oral, resp. rate 16, height 5\' 1"  (1.549 m), weight 76.2 kg, SpO2 99 %.  Medical Problem List and Plan: 1.  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  Antithrombotics: -DVT/anticoagulation: Subcutaneous heparin              -antiplatelet therapy: Aspirin 325 mg daily and Plavix 75 mg daily x3 months then Plavix alone 3. Pain Management: Neurontin 200 mg nightly, oxycodone as needed, Flexeril as needed 4. Mood: Provide emotional support             -antipsychotic agents: N/A 5. Neuropsych: This patient is capable of making decisions on her own behalf. 6. Skin/Wound Care: Routine skin checks 7. Fluids/Electrolytes/Nutrition: Routine and outs with follow-up chemistries ordered 8.  Symptomatic left ICA stenosis.  Status post left transcarotid artery revascularization 04/06/2021.  Vascular surgery following 9.  Hypertension.  Norvasc 10 mg daily, HCTZ 12.5 mg daily.  Monitor with increased mobility Vitals:   04/15/21 2001 04/16/21 0426  BP: (!) 145/65 131/63  Pulse: 86 67  Resp:  16  Temp:  97.6 F (36.4 C)  SpO2:  99%    10.  Diabetes mellitus.  Hemoglobin A1c 6.5.  SSI.             -monitor CBG's AC/HS CBG (last 3)  Recent Labs    04/15/21 1620 04/15/21 2107 04/16/21 0618  GLUCAP 206* 171* 120*   Fair control cont to monitor off meds  11.  Hyperlipidemia.  Lipitor 12.  GERD.  Protonix 13.  CKD stage III.  Baseline creatinine 1.18-1.34.  Follow-up chemistries 14.  Obesity.  BMI 31.74.  Dietary follow-up 15.  COPD.  Quit smoking 33 years ago.  Check oxygen saturations every  shift              -oxygen per [place] at 2L currently  16. Urinary retention with painful caths will insert foley  and do another voiding trial in a week once mobility improves add flomax as well   LOS: 3 days A FACE TO FACE EVALUATION WAS PERFORMED  Erick Colace 04/16/2021, 9:53 AM

## 2021-04-17 LAB — GLUCOSE, CAPILLARY
Glucose-Capillary: 163 mg/dL — ABNORMAL HIGH (ref 70–99)
Glucose-Capillary: 190 mg/dL — ABNORMAL HIGH (ref 70–99)
Glucose-Capillary: 205 mg/dL — ABNORMAL HIGH (ref 70–99)
Glucose-Capillary: 167 mg/dL — ABNORMAL HIGH (ref 70–99)

## 2021-04-17 MED ORDER — SORBITOL 70 % SOLN
30.0000 mL | Freq: Every day | Status: DC | PRN
  Administered 2021-04-17 – 2021-04-19 (×2): 30 mL via ORAL
  Filled 2021-04-17 (×3): qty 30

## 2021-04-17 MED ORDER — TIZANIDINE HCL 2 MG PO TABS
2.0000 mg | ORAL_TABLET | Freq: Every day | ORAL | Status: DC
  Administered 2021-04-17: 2 mg via ORAL
  Filled 2021-04-17: qty 1

## 2021-04-17 NOTE — Progress Notes (Signed)
Chaplain responded to Sp Consult to deliver and explain AD.  Pt expressed desire for her son to be her POA b/c her ex-husband is mostly Spanish speaking and doesn't understand English very well. Plus in the next few months he is going to move back to  contact person.  Until AD can be completed and notarized Chaplain req at pt's request to place son's name in #1 position and to call him if needed.  Chaplain has delivered AD and explained it to pt.  Son will assist in completing the AD.  Please contact Sp. Care when ready for a notary.  Vernell Morgans Chaplain

## 2021-04-17 NOTE — Progress Notes (Signed)
Speech Language Pathology Daily Session Note  Patient Details  Name: Jade [name] MRN: [id number] Date of Birth: [birth year]  Today's Date: 04/17/2021 SLP Individual Time: 1435-1530 SLP Individual Time Calculation (min): 55 min  Short Term Goals: Week 1: SLP Short Term Goal 1 (Week 1): Patient will recall and demonstrate use of at least 3 beneficial compensatory memory strategies with mod I SLP Short Term Goal 2 (Week 1): Patient will demonstrate selective attention within a mildly distracting environment with sup A verbal cues to redirect SLP Short Term Goal 3 (Week 1): Patient will complete complex problem solving with sup A verbal cues  Skilled Therapeutic Interventions: Patient agreeable to skilled ST intervention with focus on cognitive goals. SLP began session by engaging in discussion and providing education on compensatory memory strategies, as patient reported some "forgetfulness" at baseline. Patient is currently limited my hemiparesis in dominant hand which impedes writing abilities (patient's preferred method of memory compensation - writing reminders). Discussed uses simple technology such as setting alarms, timers on microwave/oven, using calendar or notes feature in phone. Also discussed organizational strategies and following routines as beneficial strategies as well. By end of session, patient was able to teach back up to 4 beneficial strategies at sup A level. SLP then facilitated session by providing mod A verbal/visual cues for medication organization and pill label comprehension task. Patient required increased cueing 2/2 visual deficits, and verbal support for medication label comprehension (e.g., take one tablet twice daily). Errors made during today's session would have resulted in an over-dose situation. Patient indicated feeling overwhelmed by the number of medications she has taken in the past and is hopeful to gain more insight into her current medications, in addition to  different organization strategies and techniques. Patient was left in chair with alarm activated and immediate needs within reach at end of session. Continue per current plan of care.      Pain Pain Assessment Pain Scale: 0-10 Pain Score: 0-No pain  Therapy/Group: Individual Therapy  Tamala Ser 04/17/2021, 2:40 PM

## 2021-04-17 NOTE — Progress Notes (Signed)
Occupational Therapy Session Note  Patient Details  Name: Jade [name] MRN: [id number] Date of Birth: August 07, 1956  Today's Date: 04/17/2021 OT Individual Time: 0903-1004 OT Individual Time Calculation (min): 61 min    Short Term Goals: Week 1:  OT Short Term Goal 1 (Week 1): Pt will maintain static sitting EOB with supervision for 3 mins in preparation for selfcare tasks. OT Short Term Goal 2 (Week 1): Pt will maintain dynamic sitting balance with min assist overall during completion of selfcare tasks. OT Short Term Goal 3 (Week 1): Pt will complete UB bathing with min assist for 2 consecutive sessions. OT Short Term Goal 4 (Week 1): Pt will complete LB bathing with max assist sit to stand.  Skilled Therapeutic Interventions/Progress Updates:    Pt in bed to start with max assist for transfer to the EOB for dressing.  Max assist needed for donning pullover shirt with mod assist for sitting balance.  Increased posterior pelvic tilt noted with posterior LOB and right lean.  Total assist +2 for donning LB clothing (pt 40%) sit to stand.  Max assist for squat pivot transfer to the wheelchair for toileting tasks.  Total assist for stand pivot transfer to the 3:1 over the toilet with total +2 (pt 30%) for clothing management and toilet hygiene.  Finished session with transfer to the sink for grooming tasks of oral hygiene and combing Jade [name] hair with setup.  She was left with the call button and phone in reach and safety belt in place.     Therapy Documentation Precautions:  Precautions Precautions: Fall Precaution Comments: RUE hemparesis with increased tone, diplopia Restrictions Weight Bearing Restrictions: No  Pain: Pain Assessment Pain Scale: Faces Pain Score: 0-No pain ADL: See Care Tool Section for some details of mobility and selfcare   Therapy/Group: Individual Therapy  Doniqua Saxby OTR/L 04/17/2021, 12:29 PM

## 2021-04-17 NOTE — Care Management (Signed)
[facility]patient [facility] Individual Statement of Services  Patient Name:  Jade [name]  Date:  04/17/2021  Welcome to the [facility]patient [facility].  Our goal is to provide you with an individualized program based on your diagnosis and situation, designed to meet your specific needs.  With this comprehensive rehabilitation program, you will be expected to participate in at least 3 hours of rehabilitation therapies Monday-Friday, with modified therapy programming on the weekends.  Your rehabilitation program will include the following services:  Physical Therapy (PT), Occupational Therapy (OT), Speech Therapy (ST), 24 hour per day rehabilitation nursing, Therapeutic Recreaction (TR), Psychology, Neuropsychology, Care Coordinator, Rehabilitation Medicine, Nutrition Services, Pharmacy Services, and Other  Weekly team conferences will be held on Wednesdays to discuss your progress.  Your Inpatient Rehabilitation Care Coordinator will talk with you frequently to get your input and to update you on team discussions.  Team conferences with you and your family in attendance may also be held.  Expected length of stay: 21-30    Overall anticipated outcome: Minimal Assistance  Depending on your progress and recovery, your program may change. Your Inpatient Rehabilitation Care Coordinator will coordinate services and will keep you informed of any changes. Your Inpatient Rehabilitation Care Coordinator's name and contact numbers are listed  below.  The following services may also be recommended but are not provided by the Inpatient Rehabilitation Center:  Driving Evaluations Home Health Rehabiltiation Services Outpatient Rehabilitation Services Vocational Rehabilitation   Arrangements will be made to provide these services after discharge if needed.  Arrangements include referral to agencies that provide these services.  Your insurance has been verified to be:  [facility]  Your  primary doctor is:  Romie Jumper  Pertinent information will be shared with your doctor and your insurance company.  Inpatient Rehabilitation Care Coordinator:  Lavera Guise, [place] [phone] or [phone]  Information discussed with and copy given to patient by: Gretchen Short, 04/17/2021, 8:24 AM

## 2021-04-17 NOTE — Progress Notes (Signed)
PROGRESS NOTE   Subjective/Complaints: Foley placed ,  Had RUE and RLE spasms last noc ROS- neg CP , SOB, N/V/D Objective:   No results found. No results for input(s): WBC, HGB, HCT, PLT in the last 72 hours.  No results for input(s): NA, K, CL, CO2, GLUCOSE, BUN, CREATININE, CALCIUM in the last 72 hours.   Intake/Output Summary (Last 24 hours) at 04/17/2021 0823 Last data filed at 04/17/2021 0356 Gross per 24 hour  Intake 360 ml  Output 910 ml  Net -550 ml         Physical Exam: Vital Signs Blood pressure (!) 149/80, pulse (!) 105, temperature 97.8 F (36.6 C), temperature source Oral, resp. rate 18, height 5\' 1"  (1.549 m), weight 76.2 kg, SpO2 98 %.  General: No acute distress Mood and affect are appropriate Heart: Regular rate and rhythm no rubs murmurs or extra sounds Lungs: Clear to auscultation, breathing unlabored, no rales or wheezes Abdomen: Positive bowel sounds, soft nontender to palpation, nondistended Extremities: No clubbing, cyanosis, or edema Skin: No evidence of breakdown, no evidence of rash  Neurologic: Cranial nerves II through XII intact, motor strength is 5/5 in left deltoid, bicep, tricep, grip, hip flexor, knee extensors, ankle dorsiflexor and plantar flexor Trace Right shoulder retraction and knee ext o/w 0 Sensory exam normal sensation to light touch and proprioception in bilateral upper and lower extremities  Musculoskeletal: Full range of motion in all 4 extremities. No joint swelling   Assessment/Plan: 1. Functional deficits which require 3+ hours per day of interdisciplinary therapy in a comprehensive inpatient rehab setting. Physiatrist is providing close team supervision and 24 hour management of active medical problems listed below. Physiatrist and rehab team continue to assess barriers to discharge/monitor patient progress toward functional and medical goals  Care  Tool:  Bathing    Body parts bathed by patient: Chest, Abdomen, Right upper leg, Left upper leg, Face, Front perineal area   Body parts bathed by helper: Buttocks, Right lower leg, Left lower leg     Bathing assist Assist Level: Maximal Assistance - Patient 24 - 49%     Upper Body Dressing/Undressing Upper body dressing   What is the patient wearing?: Hospital gown only    Upper body assist Assist Level: Maximal Assistance - Patient 25 - 49%    Lower Body Dressing/Undressing Lower body dressing      What is the patient wearing?: Pants, Incontinence brief     Lower body assist Assist for lower body dressing: Total Assistance - Patient < 25%     Toileting Toileting    Toileting assist Assist for toileting: Dependent - Patient 0%     Transfers Chair/bed transfer  Transfers assist     Chair/bed transfer assist level: Maximal Assistance - Patient 25 - 49%     Locomotion Ambulation   Ambulation assist   Ambulation activity did not occur: Safety/medical concerns          Walk 10 feet activity   Assist  Walk 10 feet activity did not occur: Safety/medical concerns        Walk 50 feet activity   Assist Walk 50 feet with 2 turns activity did  not occur: Safety/medical concerns         Walk 150 feet activity   Assist Walk 150 feet activity did not occur: Safety/medical concerns         Walk 10 feet on uneven surface  activity   Assist Walk 10 feet on uneven surfaces activity did not occur: Safety/medical concerns         Wheelchair     Assist Is the patient using a wheelchair?: Yes      Wheelchair assist level: Dependent - Patient 0% Max wheelchair distance: 50    Wheelchair 50 feet with 2 turns activity    Assist        Assist Level: Dependent - Patient 0%   Wheelchair 150 feet activity     Assist      Assist Level: Dependent - Patient 0%   Blood pressure (!) 149/80, pulse (!) 105, temperature 97.8 F  (36.6 C), temperature source Oral, resp. rate 18, height 5\' 1"  (1.549 m), weight 76.2 kg, SpO2 98 %.  Medical Problem List and Plan: 1.  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  Antithrombotics: -DVT/anticoagulation: Subcutaneous heparin              -antiplatelet therapy: Aspirin 325 mg daily and Plavix 75 mg daily x3 months then Plavix alone 3. Pain Management: Neurontin 200 mg nightly, oxycodone as needed, Flexeril as needed 4. Mood: Provide emotional support             -antipsychotic agents: N/A 5. Neuropsych: This patient is capable of making decisions on her own behalf. 6. Skin/Wound Care: Routine skin checks 7. Fluids/Electrolytes/Nutrition: Routine and outs with follow-up chemistries ordered 8.  Symptomatic left ICA stenosis.  Status post left transcarotid artery revascularization 04/06/2021.  Vascular surgery following 9.  Hypertension.  Norvasc 10 mg daily, HCTZ 12.5 mg daily.  Monitor with increased mobility Vitals:   04/16/21 1946 04/17/21 0354  BP: (!) 160/70 (!) 149/80  Pulse: 99 (!) 105  Resp: 18 18  Temp: (!) 97.5 F (36.4 C) 97.8 F (36.6 C)  SpO2: 99% 98%   Right M2 stenosis would avoid low BPs 10.  Diabetes mellitus.  Hemoglobin A1c 6.5.  SSI.             -monitor CBG's AC/HS CBG (last 3)  Recent Labs    04/16/21 1620 04/16/21 2108 04/17/21 0554  GLUCAP 167* 137* 163*   Fair control cont to monitor off meds  11.  Hyperlipidemia.  Lipitor 12.  GERD.  Protonix 13.  CKD stage III.  Baseline creatinine 1.18-1.34.  Follow-up chemistries 14.  Obesity.  BMI 31.74.  Dietary follow-up 15.   COPD.  Quit smoking 33 years ago.  Check oxygen saturations every shift              -oxygen per [place] at 2L currently  16. Urinary retention with painful caths will insert foley and do another voiding trial  in a week once mobility improves add flomax as well   LOS: 4 days A FACE TO FACE EVALUATION WAS PERFORMED  Erick Colace 04/17/2021, 8:23 AM

## 2021-04-17 NOTE — Progress Notes (Signed)
Physical Therapy Session Note  Patient Details  Name: Jade [name] MRN: [id number] Date of Birth: [birth year]  Today's Date: 04/17/2021 PT Individual Time: 1305-1402 PT Individual Time Calculation (min): 57 min   Short Term Goals: Week 1:  PT Short Term Goal 1 (Week 1): Pt will perform bed mobility with mod assist PT Short Term Goal 2 (Week 1): Pt will transfer to [facility] with mod Assist on SB PT Short Term Goal 3 (Week 1): pt will ambulate 60ft with max A +2 at rail in hall PT Short Term Goal 4 (Week 1): Pt will propell WC 49ft with mod assist  Skilled Therapeutic Interventions/Progress Updates:    Pt received sitting in w/c and eager to participate in therapy session although reports recent sorbitol prescription administered with possible need to have BM. Due to this remained in pt's room for beginning of therapy session to provide close proximity to toilet. Noted increased edema in R UE compared to L despite being positioned on pillows. Sit>stand w/c>stedy in front of mirror for biofeedback for midline orientation with heavy max A of 1 for lifting to stand and balance due to R lateral trunk lean/pushing - demos some quad activation on R LE with ability to move towards extension in standing/WBing position. Standing in stedy attempted L UE reaching task promoting increased upright trunk posture with increased R LE extension and L trunk lean (out of the pusher tendency). Pt suddenly reports need to use bathroom. Stedy transfer into bathroom with +2 assist for trunk control/balance safety and stedy management. Sit>stand from stedy seat to [facility] over toilet with max assist of 1 for lifting/lowering and balance due to R lean - standing in stedy with min assist of 1 using B UE support on stedy bar during total assist LB clothing management. Continent of bowels  - total assist peri-care from +2 assist while standing with min/mod assist.. Requires increased time for voiding.    Transported to/from gym in w/c  for time management and energy conservation. Donned R LE ankle DF assist ACE wrap. Sit>stand w/c>L UE support on hallway railing with max assist for lifting, balance, and blocking R knee buckle. Gait training ~24ft, ~44ft at L hallway rail with max assist of 1 and +2 close w/c follow - requires max assist to advance R LE during swing with pt initiating movement ~10% though has excessive hip adduction and internal rotation (thearpist having to prevent excessive internal rotation to avoid poor knee alignment)  - requires total assist to block R knee buckle during stance with pt tending to collapse forward at the trunk and hips requiring max cuing to maintain upright posture with max facilitation for increased R hip extension.   Transported back to room and pt requesting to remain up in w/c - left with needs in reach, seat belt alarm on, and R UE supported on 1/2 lap tray and pillows.  Therapy Documentation Precautions:  Precautions Precautions: Fall Precaution Comments: RUE hemparesis with increased tone, diplopia Restrictions Weight Bearing Restrictions: No   Pain: Reports "knees" popping while sitting in perched position in stedy - limited time in this position to avoid increased discomfort.   Therapy/Group: Individual Therapy  Ginny Forth , PT, DPT, NCS, CSRS 04/17/2021, 12:50 PM

## 2021-04-18 ENCOUNTER — Inpatient Hospital Stay (HOSPITAL_COMMUNITY): Payer: Medicare Other

## 2021-04-18 LAB — GLUCOSE, CAPILLARY
Glucose-Capillary: 135 mg/dL — ABNORMAL HIGH (ref 70–99)
Glucose-Capillary: 213 mg/dL — ABNORMAL HIGH (ref 70–99)
Glucose-Capillary: 182 mg/dL — ABNORMAL HIGH (ref 70–99)
Glucose-Capillary: 145 mg/dL — ABNORMAL HIGH (ref 70–99)

## 2021-04-18 MED ORDER — TIZANIDINE HCL 2 MG PO TABS
2.0000 mg | ORAL_TABLET | Freq: Every day | ORAL | Status: DC
  Administered 2021-04-18: 1 mg via ORAL
  Filled 2021-04-18 (×2): qty 1

## 2021-04-18 NOTE — Progress Notes (Signed)
Physical Therapy Session Note  Patient Details  Name: Jade [name] MRN: [id number] Date of Birth: 1956-04-25  Today's Date: 04/18/2021 PT Individual Time: 9826-4158; 3094-0768 PT Individual Time Calculation (min): 73 min; 57 min  Short Term Goals: Week 1:  PT Short Term Goal 1 (Week 1): Pt will perform bed mobility with mod assist PT Short Term Goal 2 (Week 1): Pt will transfer to [facility] with mod Assist on SB PT Short Term Goal 3 (Week 1): pt will ambulate 76f with max A +2 at rail in hall PT Short Term Goal 4 (Week 1): Pt will propell WC 568fwith mod assist  Skilled Therapeutic Interventions/Progress Updates:  Session One: Pt asleep supine in bed with HOB elevated, easily roused by voice. Pt agreeable to therapy. 3rd metatarsal bruised on LLE, MD in room to assess and complete rounds. Therapist donned TEDS. Supine>EOB with MaxA for BLE/Trunk management.   STS with LUE pull up assist on stedy/lite gait handle bars and MaxA with RLE knee blocked and manual facilitation of trunk towards midline to prevent pushing towards the right. Verbal cuing to encourage knee flexion of LLE (out of pushers tendency) when transitioning towards sitting.   Midline orientation while in stedy and with upper body/lower body dressing, up to MinA for truncal correction.  Pt pants, shirt, and lite gait harness donned while seated in high perch position in stedy. Verbal/visual cuing with +2 in front of pt for midline orientation. Pt able to maintain <30 seconds before additional cuing. Pt required MaxA for dressing. TotalA for harness placement. Transferred to wc via stedy.  DF wrap neutral positioning technique applied in sitting. Attempted to step up onto treadmill with +2 assist for steering and additional support for pt confidence. Manual facilitation weight shifting towards right with right knee block to allow LLE step up. +2 attempted to assist in hip/knee flexion to step up onto treadmill. Pt unable to allow  facilitation getting stuck in full extension. Switched to over ground gait training.   Lite gait over ground gait training for assist in partial body weight support with LUE support. First bout [id number] bout [id number] after partial body weight support and tension cords applied to anterior hips for facilitation of improved shoulder-hip-knee alignment/ maintaining anterior weight shift. +2 for steering lite gait. Pt able to consistently initiate ~10% of swing phase with verbal and tactile cues. Required facilitation of weight shifting, placement of RLE, and providing right knee block in stance.   Pt in wc with brakes locked, seatbelt alarm, call bell within reach. All needs met at this time.   Session Two:  Pt supine in bed with HOB elevated. Pt agreeable to therapy and requests to video gait to see progression/show family members. Video release form signed in session. Therapist donned shoes TotalA.   Supine>EOB Mod A. Pt able to transition LLE off EOB and transitions RLE ~90% of the way requiring minor assist from therapist for completion of RLE off EOB. Facilitation provided for Trunk transition. Pt able to maintain seated balance with noted posterior pelvic tilt while therapist set up wc for squat pivot transfer.   Squat pivot bed>wc towards the right with heavy ModA, +2 provided light ModA posteriorly (two squat pivots for safety of bilateral knee alignment in transfer). Pt required assist to adjust RLE hip placement once in wc for improved weight distribution and posture.  Squat pivot wc>bed towards the left with ModA, +2 provided minA (completed in one motion). Verbal and visual cuing for  head-hips technique/anterior lean and facilitation of BLE placement for set up with both squat pivots.   STS throughout session heavy ModA up to MaxA towards end of session 2/2 fatigue and pushing towards the right. R knee block, facilitation of weight shifting to promote improved midline, and upright posture.    Gait training with hall railing on left side 2x32f MaxA. +2 for close wc follow. Pt able to initiate swing phase~10% then required manual facilitation to complete swing through, placement, and knee block in stance phase of RLE. Manual facilitation weight shifting towards left and (especially during 2nd bout 2/2 fatigue) anterior rotation/stabilization at bilateral hips to prevent posterior LOB. Verbal cuing for upright posture.   Pt in bed with HOB elevated, bed alarm on, and call bell within reach. All needs met at this time.   Therapy Documentation Precautions:  Precautions Precautions: Fall Precaution Comments: RUE hemparesis with increased tone, diplopia Restrictions Weight Bearing Restrictions: No  Pain: Pain Assessment Pain Scale: Faces Pain Score: 0-No pain Faces Pain Scale: No hurt   Therapy/Group: Individual Therapy  Jessilynn Taft, SPT  04/18/2021, 12:05 PM

## 2021-04-18 NOTE — Progress Notes (Signed)
Occupational Therapy Session Note  Patient Details  Name: Jade [name] MRN: [id number] Date of Birth: January 27, 1956  Today's Date: 04/18/2021 OT Individual Time: 1121-1205 OT Individual Time Calculation (min): 44 min    Short Term Goals: Week 1:  OT Short Term Goal 1 (Week 1): Pt will maintain static sitting EOB with supervision for 3 mins in preparation for selfcare tasks. OT Short Term Goal 2 (Week 1): Pt will maintain dynamic sitting balance with min assist overall during completion of selfcare tasks. OT Short Term Goal 3 (Week 1): Pt will complete UB bathing with min assist for 2 consecutive sessions. OT Short Term Goal 4 (Week 1): Pt will complete LB bathing with max assist sit to stand.  Skilled Therapeutic Interventions/Progress Updates:    Pt needed max assist for stand pivot transfer to the therapy mat from the wheelchair.  Had her work on functional reaching to the left side to help decreased pushing while focusing on upright trunk and head.  Pt exhibits limited thoracic and cervical extension with increased posterior pelvic tilt.  Max demonstrational cueing to attain neutral pelvic tilt.  RUE placed on hand positioning splint secondary to increased flexor tone noted in the wrist and digits.  She was able to reach to the left with the LUE and transition from sit to squat with overall max assist to place bean bags back in a container.  Decreased efficiency with weightshifting to the left to place them.  Increased pushing to the right noted with transition from squat to stand with total +2 (pt 35%) to maintain standing with increased weightshift to the left.  Finished session with transfer back to the wheelchair at squat pivot max assist.  Took pt back to the room where the RUE was positioned in resting hand splint for increased digit extension.  Safety alarm in place with call button in reach and NT in to check CBG.    Therapy Documentation Precautions:  Precautions Precautions:  Fall Precaution Comments: RUE hemparesis with increased tone, diplopia Restrictions Weight Bearing Restrictions: No   Pain: Pain Assessment Pain Scale: Faces Pain Score: 0-No pain Faces Pain Scale: No hurt Pain Type: Acute pain Pain Location: Hand Pain Orientation: Left Pain Descriptors / Indicators: Discomfort Pain Onset: With Activity Pain Intervention(s): Repositioned;Emotional support ADL: See Care Tool Section for some details of mobility and selfcare   Therapy/Group: Individual Therapy  Alexandru Moorer OTR/L 04/18/2021, 12:52 PM

## 2021-04-18 NOTE — Progress Notes (Signed)
PROGRESS NOTE   Subjective/Complaints: Slept well with tizanidine but feels groggy this am  Bruising Left 3rd toe  ROS- neg CP , SOB, N/V/D Objective:   No results found. No results for input(s): WBC, HGB, HCT, PLT in the last 72 hours.  No results for input(s): NA, K, CL, CO2, GLUCOSE, BUN, CREATININE, CALCIUM in the last 72 hours.   Intake/Output Summary (Last 24 hours) at 04/18/2021 0814 Last data filed at 04/18/2021 0742 Gross per 24 hour  Intake 680 ml  Output 950 ml  Net -270 ml         Physical Exam: Vital Signs Blood pressure 129/63, pulse 72, temperature 98.1 F (36.7 C), resp. rate 18, height 5\' 1"  (1.549 m), weight 76.2 kg, SpO2 99 %.  General: No acute distress Mood and affect are appropriate Heart: Regular rate and rhythm no rubs murmurs or extra sounds Lungs: Clear to auscultation, breathing unlabored, no rales or wheezes Abdomen: Positive bowel sounds, soft nontender to palpation, nondistended Extremities: No clubbing, cyanosis, or edema Skin: No evidence of breakdown, no evidence of rash  Neurologic: Cranial nerves II through XII intact, motor strength is 5/5 in left deltoid, bicep, tricep, grip, hip flexor, knee extensors, ankle dorsiflexor and plantar flexor Trace Right shoulder retraction and knee ext o/w 0 Sensory exam normal sensation to light touch and proprioception in bilateral upper and lower extremities  Musculoskeletal: Full range of motion in all 4 extremities. No joint swelling, no pain to palp Left great toe no pain with ROM, check xray    Assessment/Plan: 1. Functional deficits which require 3+ hours per day of interdisciplinary therapy in a comprehensive inpatient rehab setting. Physiatrist is providing close team supervision and 24 hour management of active medical problems listed below. Physiatrist and rehab team continue to assess barriers to discharge/monitor patient progress  toward functional and medical goals  Care Tool:  Bathing    Body parts bathed by patient: Chest, Abdomen, Right upper leg, Left upper leg, Face, Front perineal area   Body parts bathed by helper: Buttocks, Right lower leg, Left lower leg     Bathing assist Assist Level: Maximal Assistance - Patient 24 - 49%     Upper Body Dressing/Undressing Upper body dressing   What is the patient wearing?: Pull over shirt    Upper body assist Assist Level: Maximal Assistance - Patient 25 - 49%    Lower Body Dressing/Undressing Lower body dressing      What is the patient wearing?: Pants, Incontinence brief     Lower body assist Assist for lower body dressing: 2 Helpers     Toileting Toileting    Toileting assist Assist for toileting: 2 Helpers     Transfers Chair/bed transfer  Transfers assist     Chair/bed transfer assist level: Maximal Assistance - Patient 25 - 49% (squat pivot)     Locomotion Ambulation   Ambulation assist   Ambulation activity did not occur: Safety/medical concerns  Assist level: 2 helpers (max A of 1 and +2 w/c follow) Assistive device: Other (comment) (L hallway rail) Max distance: 50ft   Walk 10 feet activity   Assist  Walk 10 feet activity did not  occur: Safety/medical concerns        Walk 50 feet activity   Assist Walk 50 feet with 2 turns activity did not occur: Safety/medical concerns         Walk 150 feet activity   Assist Walk 150 feet activity did not occur: Safety/medical concerns         Walk 10 feet on uneven surface  activity   Assist Walk 10 feet on uneven surfaces activity did not occur: Safety/medical concerns         Wheelchair     Assist Is the patient using a wheelchair?: Yes      Wheelchair assist level: Dependent - Patient 0% Max wheelchair distance: 50    Wheelchair 50 feet with 2 turns activity    Assist        Assist Level: Dependent - Patient 0%   Wheelchair 150 feet  activity     Assist      Assist Level: Dependent - Patient 0%   Blood pressure 129/63, pulse 72, temperature 98.1 F (36.7 C), resp. rate 18, height 5\' 1"  (1.549 m), weight 76.2 kg, SpO2 99 %.  Medical Problem List and Plan: 1.  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  Antithrombotics: -DVT/anticoagulation: Subcutaneous heparin              -antiplatelet therapy: Aspirin 325 mg daily and Plavix 75 mg daily x3 months then Plavix alone 3. Pain Management: Neurontin 200 mg nightly, oxycodone as needed, Flexeril as needed 4. Mood: Provide emotional support             -antipsychotic agents: N/A 5. Neuropsych: This patient is capable of making decisions on her own behalf. 6. Skin/Wound Care: Routine skin checks 7. Fluids/Electrolytes/Nutrition: Routine and outs with follow-up chemistries ordered 8.  Symptomatic left ICA stenosis.  Status post left transcarotid artery revascularization 04/06/2021.  Vascular surgery following 9.  Hypertension.  Norvasc 10 mg daily, HCTZ 12.5 mg daily.  Monitor with increased mobility Vitals:   04/17/21 1934 04/18/21 0352  BP: (!) 168/81 129/63  Pulse: 93 72  Resp: 18 18  Temp: 98 F (36.7 C) 98.1 F (36.7 C)  SpO2: 100% 99%   Right M2 stenosis would avoid low BPs 10.  Diabetes mellitus.  Hemoglobin A1c 6.5.  SSI.             -monitor CBG's AC/HS CBG (last 3)  Recent Labs    04/17/21 1629 04/17/21 2107 04/18/21 0604  GLUCAP 205* 167* 135*   Fair control cont to monitor  off meds  11.  Hyperlipidemia.  Lipitor 12.  GERD.  Protonix 13.  CKD stage III.  Baseline creatinine 1.18-1.34.  Follow-up chemistries 14.  Obesity.  BMI 31.74.  Dietary follow-up 15.  COPD.  Quit smoking 33 years ago.  Check oxygen saturations every shift              -oxygen per [place] at 2L currently  16. Urinary retention with painful caths will insert foley and do another voiding trial in a week once mobility improves add flomax as well  17.  Left toe bruising in sensate foot, pt does not recall an injury , exam unremarkable check xray LOS: 5 days A FACE TO FACE EVALUATION WAS PERFORMED  Erick Colace 04/18/2021, 8:14 AM

## 2021-04-18 NOTE — Progress Notes (Signed)
Speech Language Pathology Daily Session Note  Patient Details  Name: Jade [name] MRN: [id number] Date of Birth: 1956/04/16  Today's Date: 04/18/2021 SLP Individual Time: 0930-1015 SLP Individual Time Calculation (min): 45 min  Short Term Goals: Week 1: SLP Short Term Goal 1 (Week 1): Patient will recall and demonstrate use of at least 3 beneficial compensatory memory strategies with mod I SLP Short Term Goal 2 (Week 1): Patient will demonstrate selective attention within a mildly distracting environment with sup A verbal cues to redirect SLP Short Term Goal 3 (Week 1): Patient will complete complex problem solving with sup A verbal cues  Skilled Therapeutic Interventions: Patient agreeable to skilled ST intervention with focus on cognitive goals. SLP facilitated session by providing overall sup A verbal cues for medication management task involving recall of name and purpose of current medications and pill label comprehension using medication list which was provided to patient. Patient was familiar with 80% of her medications as she had taken them previously at some point prior to admission. She exhibited significant improvement with pill label planning, organization, and reasoning as compared to yesterday's session where she required mod A (which may have attributed to visual deficits). She was able to selectively attend to tasks within a mildly distracting environment with sup A verbal cues to re-direct following occasional interruptions. Patient was left in chair with alarm activated and immediate needs within reach at end of session. Continue per current plan of care.      Pain Pain Assessment Pain Scale: 0-10 Pain Score: 0-No pain  Therapy/Group: Individual Therapy  Jade [name] T Jade [name] 04/18/2021, 10:08 AM

## 2021-04-19 DIAGNOSIS — I639 Cerebral infarction, unspecified: Secondary | ICD-10-CM

## 2021-04-19 LAB — GLUCOSE, CAPILLARY
Glucose-Capillary: 128 mg/dL — ABNORMAL HIGH (ref 70–99)
Glucose-Capillary: 162 mg/dL — ABNORMAL HIGH (ref 70–99)

## 2021-04-19 MED ORDER — FLEET ENEMA 7-19 GM/118ML RE ENEM
1.0000 | ENEMA | Freq: Once | RECTAL | Status: DC
  Filled 2021-04-19 (×2): qty 1

## 2021-04-19 MED ORDER — SORBITOL 70 % SOLN
60.0000 mL | Freq: Once | Status: DC
  Filled 2021-04-19: qty 60

## 2021-04-19 NOTE — Progress Notes (Signed)
PROGRESS NOTE   Subjective/Complaints:  Sleepy- says Zanaflex makes her sleepy, also constipated-  LBM 2-3 days ago- no results with miralax/Sorbitol yesterday- Feels 'stuck".   Slept OK.   ROS-  Pt denies SOB, abd pain, CP, N/V/C/D, and vision changes  Objective:   DG Toe 3rd Left  Result Date: 04/18/2021 CLINICAL DATA:  Left third toe bruising. EXAM: LEFT THIRD TOE COMPARISON:  Left foot x-rays dated May 19, 2010. FINDINGS: Acute nondisplaced minimally impacted fracture at the base of the third middle phalanx. No dislocation. Soft tissue swelling of the third toe. IMPRESSION: 1. Acute nondisplaced fracture of the third middle phalanx. Electronically Signed   By: Obie Dredge M.D.   On: 04/18/2021 15:50   No results for input(s): WBC, HGB, HCT, PLT in the last 72 hours.  No results for input(s): NA, K, CL, CO2, GLUCOSE, BUN, CREATININE, CALCIUM in the last 72 hours.   Intake/Output Summary (Last 24 hours) at 04/19/2021 0850 Last data filed at 04/19/2021 0300 Gross per 24 hour  Intake 360 ml  Output 1100 ml  Net -740 ml        Physical Exam: Vital Signs Blood pressure (!) 140/57, pulse 64, temperature 97.6 F (36.4 C), temperature source Oral, resp. rate 16, height 5\' 1"  (1.549 m), weight 76.2 kg, SpO2 95 %.    General: awake, alert, appropriate, sleepy- woke to verbal stimuli- wearing eye mask;  NAD HENT: conjugate gaze; oropharynx moist CV: regular rate; no JVD Pulmonary: CTA B/L; no W/R/R- good air movement GI: soft, NT, maybe slightly distended; hypoactive BS Psychiatric: appropriate Neurological: alert  Neurologic: Cranial nerves II through XII intact, motor strength is 5/5 in left deltoid, bicep, tricep, grip, hip flexor, knee extensors, ankle dorsiflexor and plantar flexor Trace Right shoulder retraction and knee ext o/w 0- wearing R wrist resting night splint.  Sensory exam normal sensation to  light touch and proprioception in bilateral upper and lower extremities  Musculoskeletal: Full range of motion in all 4 extremities. No joint swelling, no pain to palp Left great toe no pain with ROM,    Assessment/Plan: 1. Functional deficits which require 3+ hours per day of interdisciplinary therapy in a comprehensive inpatient rehab setting. Physiatrist is providing close team supervision and 24 hour management of active medical problems listed below. Physiatrist and rehab team continue to assess barriers to discharge/monitor patient progress toward functional and medical goals  Care Tool:  Bathing    Body parts bathed by patient: Chest, Abdomen, Right upper leg, Left upper leg, Face, Front perineal area   Body parts bathed by helper: Buttocks, Right lower leg, Left lower leg     Bathing assist Assist Level: Maximal Assistance - Patient 24 - 49%     Upper Body Dressing/Undressing Upper body dressing   What is the patient wearing?: Pull over shirt    Upper body assist Assist Level: Maximal Assistance - Patient 25 - 49%    Lower Body Dressing/Undressing Lower body dressing      What is the patient wearing?: Pants, Incontinence brief     Lower body assist Assist for lower body dressing: 2 Helpers     Toileting Toileting  Toileting assist Assist for toileting: 2 Helpers     Transfers Chair/bed transfer  Transfers assist     Chair/bed transfer assist level: Maximal Assistance - Patient 25 - 49% (squat pivot)     Locomotion Ambulation   Ambulation assist   Ambulation activity did not occur: Safety/medical concerns  Assist level: 2 helpers (max A of 1 and +2 w/c follow) Assistive device: Other (comment) (L hallway rail) Max distance: 68ft   Walk 10 feet activity   Assist  Walk 10 feet activity did not occur: Safety/medical concerns        Walk 50 feet activity   Assist Walk 50 feet with 2 turns activity did not occur: Safety/medical  concerns         Walk 150 feet activity   Assist Walk 150 feet activity did not occur: Safety/medical concerns         Walk 10 feet on uneven surface  activity   Assist Walk 10 feet on uneven surfaces activity did not occur: Safety/medical concerns         Wheelchair     Assist Is the patient using a wheelchair?: Yes      Wheelchair assist level: Dependent - Patient 0% Max wheelchair distance: 50    Wheelchair 50 feet with 2 turns activity    Assist        Assist Level: Dependent - Patient 0%   Wheelchair 150 feet activity     Assist      Assist Level: Dependent - Patient 0%   Blood pressure (!) 140/57, pulse 64, temperature 97.6 F (36.4 C), temperature source Oral, resp. rate 16, height 5\' 1"  (1.549 m), weight 76.2 kg, SpO2 95 %.  Medical Problem List and Plan: 1.  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  Antithrombotics: -DVT/anticoagulation: Subcutaneous heparin              -antiplatelet therapy: Aspirin 325 mg daily and Plavix 75 mg daily x3 months then Plavix alone 3. Pain Management: Neurontin 200 mg nightly, oxycodone as needed, Flexeril as needed  8/24- pain controlled- con't regimen 4. Mood: Provide emotional support             -antipsychotic agents: N/A 5. Neuropsych: This patient is capable of making decisions on her own behalf. 6. Skin/Wound Care: Routine skin checks 7. Fluids/Electrolytes/Nutrition: Routine and outs with  follow-up chemistries ordered 8.  Symptomatic left ICA stenosis.  Status post left transcarotid artery revascularization 04/06/2021.  Vascular surgery following 9.  Hypertension.  Norvasc 10 mg daily, HCTZ 12.5 mg daily.  Monitor with increased mobility Vitals:   04/18/21 1937 04/19/21 0355  BP: 138/61 (!) 140/57  Pulse: 88 64  Resp: 16 16  Temp: 98.1 F (36.7 C) 97.6 F (36.4 C)  SpO2:  98% 95%   Right M2 stenosis would avoid low BPs 10.  Diabetes mellitus.  Hemoglobin A1c 6.5.  SSI.             -monitor CBG's AC/HS CBG (last 3)  Recent Labs    04/18/21 1609 04/18/21 2219 04/19/21 0627  GLUCAP 182* 145* 128*  8/24- adequate control- con't regimen 11.  Hyperlipidemia.  Lipitor 12.  GERD.  Protonix 13.  CKD stage III.  Baseline creatinine 1.18-1.34.  Follow-up chemistries 14.  Obesity.  BMI 31.74.  Dietary follow-up 15.  COPD.  Quit smoking 33 years ago.  Check oxygen saturations every shift              -oxygen per [place] at 2L currently  16. Urinary retention with painful caths will insert foley and do another voiding trial in a week once mobility improves add flomax as well  17.  Left toe bruising in sensate foot, pt does not recall an injury , exam unremarkable check xray  8/24- has nondisplaced acute fracture of L 3rd middle phalanx- will tape to 2nd toe to help.  18. Constipation  8/24- will give Sorbitol 26ml at 3pm if no BM by then and follow with Fleet enema if needed   LOS: 6 days A FACE TO FACE EVALUATION WAS PERFORMED  Santiana Glidden 04/19/2021, 8:50 AM

## 2021-04-19 NOTE — Progress Notes (Signed)
Patient ID: Jade [name], female   DOB: [birth year], 65 y.o.   MRN: [id number]  This SW covering for primary SW, Lavera Guise.   SW shared with pt updates from team conference, and d/c date 9/16. Pt encouraged to speak with her son about setting up a time next week for family edu. She is aware Trula Ore will f/u.  Cecile Sheerer, MSW, LCSWA Office: [phone] Cell: [phone] Fax: [phone]

## 2021-04-19 NOTE — Progress Notes (Signed)
Speech Language Pathology Daily Session Note  Patient Details  Name: Jade [name] MRN: [id number] Date of Birth: 05-15-1956  Today's Date: 04/19/2021 SLP Individual Time: 0900-1000 SLP Individual Time Calculation (min): 60 min  Short Term Goals: Week 1: SLP Short Term Goal 1 (Week 1): Patient will recall and demonstrate use of at least 3 beneficial compensatory memory strategies with mod I SLP Short Term Goal 2 (Week 1): Patient will demonstrate selective attention within a mildly distracting environment with sup A verbal cues to redirect SLP Short Term Goal 3 (Week 1): Patient will complete complex problem solving with sup A verbal cues  Skilled Therapeutic Interventions: Patient agreeable to skilled ST intervention with focus on cognitive goals. SLP facilitated session by providing sup A verbal cues for problem solving and reasoning with complex deductive reasoning puzzles, and min A verbal/visual cues and additional processing needed secondary to vision deficits (baseline, though pt reports exacerbated since CVA). SLP facilitated alternating attention and verbal reasoning task with sup A semantic cues and additional processing time. Patient participated in all cognitive tasks in a mildly-to-moderately distracting environment (background noise from hallway) with minimal noted distractibility and without cues to redirect. Patient was left in bed with alarm activated and immediate needs within reach at end of session. Continue per current plan of care.      Pain Pain Assessment Pain Scale: 0-10 Pain Score: 0-No pain  Therapy/Group: Individual Therapy  Abhijot Straughter T Maely Clements 04/19/2021, 10:05 AM

## 2021-04-19 NOTE — Patient Care Conference (Signed)
Inpatient RehabilitationTeam Conference and Plan of Care Update Date: 04/19/2021   Time: 10:50 AM    Patient Name: Jade [name]      Medical Record Number: [id number]  Date of Birth: [birth year] Sex: Female         Room/Bed: 4W24C/4W24C-01 Payor Info: Payor: [employer] MEDICARE / Plan: [facility] MEDICARE / Product Type: *No Product type* /    Admit Date/Time:  04/13/2021  4:00 PM  Primary Diagnosis:  CVA (cerebral vascular accident) [facility])  Hospital Problems: Principal Problem:   CVA (cerebral vascular accident) [facility])    Expected Discharge Date: Expected Discharge Date: 05/12/21  Team Members Present: Physician leading conference: Dr. Sula Soda Social Worker Present: Cecile Sheerer, LCSWA Nurse Present: Chana Bode, RN PT Present: Casimiro Needle, PT OT Present: Perrin Maltese, OT SLP Present: Eilene Ghazi, SLP PPS Coordinator present : Fae Pippin, SLP     Current Status/Progress Goal Weekly Team Focus  Bowel/Bladder             Swallow/Nutrition/ Hydration             ADL's   Min assist for UB bathing with mod assist for UB dressing.  Max assist for LB bathing and dressing sit to stand.  Max assist for squat and stand pivot transfers to different surfaces.  RUE hemiparesis at [facility] stage II movement increased tone in the digits.  She needs max hand over hand for integration into selfcare tasks.  min assist overall  selfcare retraining, transfer training, neuromuscular re-education, balance retraining, therapeutic activities, pt/family education   Mobility   supine<>sit max assist, max assist sit<>stand using L handrail support, max assist squat pivot transfers, overground high intensity gait training using litegait partial body weight support up to 5ft with mod assist for R LE management vs 63ft overground at L hallway [name] with max assist for balance and R LE management (pt able to initiate swing but requires max assist to advance then requires total  assist for R hip/knee extension during stance) - 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 Husband will help PRN since he works during the day.   Team Discussion: MD monitoring BP and nondisplaced acute fracture of L 3rd middle phalanx, constipation and urinary retention addressed with medication and placement of foley.  Patient on target to meet rehab goals: yes, Currently mod assist for upper body and total assist +2 for lower body care. Toileting and squat - pivot ; sit - stand transfers with total assist Progress limited by diplopia and increased flexor tone in digits and elbow. Currently using resting hand splint at night. Requires hand over hand assist for stabilization of extremity. Able to ambulate 58' with mod assist and management of right LE. Able to go 63' with hand [name] and max assist for balance. Demonstrating pusher syndrome. For SLP, currently supervision - min assist with mod I goals. Goals for discharge set at min assist overall.  *  See Care Plan and progress notes for long and short-term goals.   Revisions to Treatment Plan:  Working on complex problem solving, medication and money management  Teaching Needs: Safety, transfers, medication, secondary stroke risk management, etc  Current Barriers to Discharge: Decreased caregiver support and Home enviroment access/layout  Possible Resolutions to Barriers: Family education with son     Medical Summary Current Status: constipation, SBP elevated, DBP soft, nondisplaced acute fracture of L  3rd middle phalanx, urinary retention, flexor tone, obesity BMI 31.74  Barriers to Discharge: Medical stability;Weight  Barriers to Discharge Comments: constipation, SBP elevated, DBP soft, nondisplaced acute fracture of L 3rd middle phalanx, urinary retention, flexor tone, obesity BMI 31.74 Possible Resolutions to [employer]: good response to miralax and sorbitol, continue to monitor blood pressure TID, taping of thirs digit, consider adding anti-spasticity agent, continue dietary education   Continued Need for Acute Rehabilitation Level of Care: The patient requires daily medical management by a physician with specialized training in physical medicine and rehabilitation for the following reasons: Direction of a multidisciplinary physical rehabilitation program to maximize functional independence : Yes Medical management of patient stability for increased activity during participation in an intensive rehabilitation regime.: Yes Analysis of laboratory values and/or radiology reports with any subsequent need for medication adjustment and/or medical intervention. : Yes   I attest that I was present, lead the team conference, and concur with the assessment and plan of the team.   Pamelia Hoit 04/19/2021, 2:37 PM

## 2021-04-19 NOTE — Progress Notes (Signed)
Physical Therapy Session Note  Patient Details  Name: Jade [name] MRN: [id number] Date of Birth: 06/04/1956  Today's Date: 04/19/2021 PT Individual Time: 0865-7846 PT Individual Time Calculation (min): 70 min   Short Term Goals: Week 1:  PT Short Term Goal 1 (Week 1): Pt will perform bed mobility with mod assist PT Short Term Goal 2 (Week 1): Pt will transfer to [facility] with mod Assist on SB PT Short Term Goal 3 (Week 1): pt will ambulate 4ft with max A +2 at rail in hall PT Short Term Goal 4 (Week 1): Pt will propell WC 48ft with mod assist  Skilled Therapeutic Interventions/Progress Updates:    Pt received sitting in w/c and eager to participate in therapy session stating she feels really good after having a shower.  Transported to/from gym in w/c for time management and energy conservation. Donned R LE ankle DF assist ACE wrap. Sit>stand w/c>L UE support on litegait harness with min assist for lifting to stand and facilitating R knee extension - once in standing requires up to heavier mod assist due to progressively worsening R knee flexion and R lean. +2 assist donned litegait harness. ACE wrap support applied to R hand on litegait handle. High intensity gait training using litegait for minimal partial body weight support (BWS) ~181ft, ~26ft, ~65ft with +2 assist to manage litegait and mod assist for R LE management - able to advance R LE during swing ~60-70% requiring min/mod assist for increased step length and prevent excessive adduction with external rotation - requires mod assist to promote increased R hip/knee extension during stance phase - cuing throughout for upright posture and increased R hip extension during stance. Pt reporting fatigue. Transported back to room. Squat pivot transfer w/c<>EOB with max assist for lifting/pivoting hips and cuing for increased anterior trunk lean and head/hips relationship. At end of session pt left seated in w/c with needs in reach, family present, R  UE supported on 1/2 lap tray, and seat belt alarm on.  Therapy Documentation Precautions:  Precautions Precautions: Fall Precaution Comments: RUE hemparesis with increased tone, diplopia Restrictions Weight Bearing Restrictions: No   Pain:  Reports bilateral knee discomfort, chronic issue, with therapist providing seated rest breaks for pain management. Reports R UE pain in shoulder and wrist/fingers at varying times throughout session - therapist provided R UE support on litegait handle during gait training for pain management.   Therapy/Group: Individual Therapy  Ginny Forth , PT, DPT, NCS, CSRS 04/19/2021, 3:31 PM

## 2021-04-19 NOTE — Progress Notes (Signed)
Occupational Therapy Session Note  Patient Details  Name: Jade [name] MRN: [id number] Date of Birth: [birth year]  Today's Date: 04/19/2021 OT Individual Time: 1001-1032 OT Individual Time Calculation (min): 31 min    Short Term Goals: Week 1:  OT Short Term Goal 1 (Week 1): Pt will maintain static sitting EOB with supervision for 3 mins in preparation for selfcare tasks. OT Short Term Goal 2 (Week 1): Pt will maintain dynamic sitting balance with min assist overall during completion of selfcare tasks. OT Short Term Goal 3 (Week 1): Pt will complete UB bathing with min assist for 2 consecutive sessions. OT Short Term Goal 4 (Week 1): Pt will complete LB bathing with max assist sit to stand.  Skilled Therapeutic Interventions/Progress Updates:    Session 1: (6767-2094)  Pt was able to transfer to the EOB with max assist and max demonstrational cueing.  Once on the EOB she was able to scoot forward with overall max assist.  Had her work on sitting unsupported for work on donning her shoes.  She was able to donn the left with min guard but needed total assist to tie.  Max assist needed for donning the right.  Total assist for stand pivot to the wheelchair with transfer to the drop arm commode at the same level.  She demonstrated some bowel incontinence during transfer.  Total +2 (pt 25%) for clothing management and toilet hygiene sit to stand secondary to increased pushing to the right in standing.  Completed transfer back to the wheelchair at the same total assist level with pt left up and resting in the wheelchair with the RUE supported on half lap tray and with the call button and phone in reach.    Session 2: (1400-1502)  Pt in wheelchair to start with agreement to work on selfcare retraining sit to stand.  Therapist assisted with removal of TEDs and shoes with pt completing removing her pullover shirt with mod assist.  She needed total assist for removal of pants and brief while sit to stand.   Antony Salmon was used for transfer to the shower for safety and for time management at total assist.  She was able to complete sit to stand in the [place] with mod assist pulling up on the bar with the LUE.  She completed UB bathing at mod assist level with LB bathing at [facility] assist as well.  No standing was completed in the shower, but was performed at the sink with total assist for washing buttocks and front peri area.  She needed max hand over hand assist for integration of the RUE into bathing tasks to wash the LUE as well as for holding and pouring soap on her washcloth.  Transferred out to the sink with use of the [facility] for dressing.  Max assist for following hemi techniques to donn a pullover shirt.  Total assist sit to stand for donning all LB clothing.  Finished session with pt resting in the wheelchair with RUE supported on pillows and with the call button and phone in reach and safety belt in place.      Therapy Documentation Precautions:  Precautions Precautions: Fall Precaution Comments: RUE hemparesis with increased tone, diplopia Restrictions Weight Bearing Restrictions: No  Pain: Pain Assessment Pain Scale: Faces Pain Score: 0-No pain ADL: See Care Tool Section for some details of mobility and selfcare   Therapy/Group: Individual Therapy  Cy Bresee OTR/L 04/19/2021, 11:02 AM

## 2021-04-20 LAB — GLUCOSE, CAPILLARY
Glucose-Capillary: 139 mg/dL — ABNORMAL HIGH (ref 70–99)
Glucose-Capillary: 148 mg/dL — ABNORMAL HIGH (ref 70–99)
Glucose-Capillary: 163 mg/dL — ABNORMAL HIGH (ref 70–99)
Glucose-Capillary: 162 mg/dL — ABNORMAL HIGH (ref 70–99)

## 2021-04-20 MED ORDER — DICLOFENAC SODIUM 1 % EX GEL
2.0000 g | Freq: Four times a day (QID) | CUTANEOUS | Status: DC
  Administered 2021-04-20 – 2021-05-12 (×60): 2 g via TOPICAL
  Filled 2021-04-20 (×2): qty 100

## 2021-04-20 MED ORDER — ENOXAPARIN SODIUM 40 MG/0.4ML IJ SOSY
40.0000 mg | PREFILLED_SYRINGE | INTRAMUSCULAR | Status: DC
  Administered 2021-04-20 – 2021-05-03 (×14): 40 mg via SUBCUTANEOUS
  Filled 2021-04-20 (×14): qty 0.4

## 2021-04-20 MED ORDER — TIZANIDINE HCL 2 MG PO TABS
2.0000 mg | ORAL_TABLET | Freq: Every day | ORAL | Status: DC
  Administered 2021-04-20: 2 mg via ORAL
  Filled 2021-04-20: qty 1

## 2021-04-20 NOTE — Plan of Care (Signed)
  Problem: RH Problem Solving Goal: LTG Patient will demonstrate problem solving for (SLP) Description: LTG:  Patient will demonstrate problem solving for basic/complex daily situations with cues  (SLP) Outcome: Completed/Met   Problem: RH Memory Goal: LTG Patient will use memory compensatory aids to (SLP) Description: LTG:  Patient will use memory compensatory aids to recall biographical/new, daily complex information with cues (SLP) Outcome: Completed/Met   Problem: RH Attention Goal: LTG Patient will demonstrate this level of attention during functional activites (SLP) Description: LTG:  Patient will will demonstrate this level of attention during functional activites (SLP) Outcome: Completed/Met   

## 2021-04-20 NOTE — Progress Notes (Addendum)
PROGRESS NOTE   Subjective/Complaints:  Pt reports LBM yesterday- upset that still has gabapentin order- will d/c.  Also upset that heparin shots are so painful- will switch to lovenox to help.  Also having a lot of spasms due ot refusing zanaflex last night- came too late- wants earlier.   Also, appropriately, refused fleet's.   Has spot on head that's bleeding- "had for years".   ROS-   Pt denies SOB, abd pain, CP, N/V/C/D, and vision changes   Objective:   DG Toe 3rd Left  Result Date: 04/18/2021 CLINICAL DATA:  Left third toe bruising. EXAM: LEFT THIRD TOE COMPARISON:  Left foot x-rays dated May 19, 2010. FINDINGS: Acute nondisplaced minimally impacted fracture at the base of the third middle phalanx. No dislocation. Soft tissue swelling of the third toe. IMPRESSION: 1. Acute nondisplaced fracture of the third middle phalanx. Electronically Signed   By: Obie Dredge M.D.   On: 04/18/2021 15:50   No results for input(s): WBC, HGB, HCT, PLT in the last 72 hours.  No results for input(s): NA, K, CL, CO2, GLUCOSE, BUN, CREATININE, CALCIUM in the last 72 hours.   Intake/Output Summary (Last 24 hours) at 04/20/2021 0916 Last data filed at 04/20/2021 0700 Gross per 24 hour  Intake 480 ml  Output 600 ml  Net -120 ml        Physical Exam: Vital Signs Blood pressure (!) 146/70, pulse 73, temperature 97.9 F (36.6 C), temperature source Oral, resp. rate 18, height 5\' 1"  (1.549 m), weight 76.2 kg, SpO2 99 %.    General: awake, alert, appropriate, sitting up doing grooming at sink with OT in room;  NAD HENT: conjugate gaze; oropharynx moist CV: regular rate; no JVD Pulmonary: CTA B/L; no W/R/R- good air movement GI: soft, NT, ND, (+)BS; hypoactive Psychiatric: appropriate; Neurological: Ox3  Skin: bleeding scab on front part of scalp Neurologic: Cranial nerves II through XII intact, motor strength is 5/5 in  left deltoid, bicep, tricep, grip, hip flexor, knee extensors, ankle dorsiflexor and plantar flexor Trace Right shoulder retraction and knee ext o/w 0- wearing R wrist resting night splint.  Sensory exam normal sensation to light touch and proprioception in bilateral upper and lower extremities  Musculoskeletal: Full range of motion in all 4 extremities. No joint swelling, no pain to palp Left great toe no pain with ROM,    Assessment/Plan: 1. Functional deficits which require 3+ hours per day of interdisciplinary therapy in a comprehensive inpatient rehab setting. Physiatrist is providing close team supervision and 24 hour management of active medical problems listed below. Physiatrist and rehab team continue to assess barriers to discharge/monitor patient progress toward functional and medical goals  Care Tool:  Bathing    Body parts bathed by patient: Chest, Abdomen, Right arm, Right upper leg, Left upper leg, Face   Body parts bathed by helper: Left lower leg, Right lower leg, Left arm, Front perineal area, Buttocks     Bathing assist Assist Level: Total Assistance - Patient < 25%     Upper Body Dressing/Undressing Upper body dressing   What is the patient wearing?: Pull over shirt    Upper body assist Assist Level:  Maximal Assistance - Patient 25 - 49%    Lower Body Dressing/Undressing Lower body dressing      What is the patient wearing?: Pants, Incontinence brief     Lower body assist Assist for lower body dressing: Total Assistance - Patient < 25%     Toileting Toileting    Toileting assist Assist for toileting: 2 Helpers     Transfers Chair/bed transfer  Transfers assist     Chair/bed transfer assist level: Maximal Assistance - Patient 25 - 49% (squat pivot)     Locomotion Ambulation   Ambulation assist   Ambulation activity did not occur: Safety/medical concerns  Assist level: Total Assistance - Patient < 25% Assistive device: Lite Gait Max  distance: 185ft   Walk 10 feet activity   Assist  Walk 10 feet activity did not occur: Safety/medical concerns        Walk 50 feet activity   Assist Walk 50 feet with 2 turns activity did not occur: Safety/medical concerns         Walk 150 feet activity   Assist Walk 150 feet activity did not occur: Safety/medical concerns         Walk 10 feet on uneven surface  activity   Assist Walk 10 feet on uneven surfaces activity did not occur: Safety/medical concerns         Wheelchair     Assist Is the patient using a wheelchair?: Yes      Wheelchair assist level: Dependent - Patient 0% Max wheelchair distance: 50    Wheelchair 50 feet with 2 turns activity    Assist        Assist Level: Dependent - Patient 0%   Wheelchair 150 feet activity     Assist      Assist Level: Dependent - Patient 0%   Blood pressure (!) 146/70, pulse 73, temperature 97.9 F (36.6 C), temperature source Oral, resp. rate 18, height 5\' 1"  (1.549 m), weight 76.2 kg, SpO2 99 %.  Medical Problem List and Plan: 1.  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  Antithrombotics: -DVT/anticoagulation: Subcutaneous heparin   8/25- changed to Lovenox 40 mg qday since CrCl is >40             -antiplatelet therapy: Aspirin 325 mg daily and Plavix 75 mg daily x3 months then Plavix alone 3. Pain Management: Neurontin 200 mg nightly, oxycodone as needed, Flexeril as needed  8/24- pain controlled- con't regimen  8/25- pt insists to stop gabapentin- will d/c.   4. Mood: Provide emotional support             -antipsychotic agents: N/A 5. Neuropsych: This patient is capable of making decisions on her own behalf. 6. Skin/Wound Care: Routine skin checks  8/25- has a scab for "years' on front of scalp- needs Derm after d/c 7.  Fluids/Electrolytes/Nutrition: Routine and outs with follow-up chemistries ordered 8.  Symptomatic left ICA stenosis.  Status post left transcarotid artery revascularization 04/06/2021.  Vascular surgery following 9.  Hypertension.  Norvasc 10 mg daily, HCTZ 12.5 mg daily.  Monitor with increased mobility Vitals:   04/19/21 1941 04/20/21 0542  BP: (!) 145/80 (!) 146/70  Pulse: 90 73  Resp: 19 18  Temp: 97.9 F (36.6 C)   SpO2: 100% 99%   Right M2 stenosis would avoid low Bps  8/25- overall controlled- con't regimen 10.  Diabetes mellitus.  Hemoglobin A1c 6.5.  SSI.             -monitor CBG's AC/HS CBG (last 3)  Recent Labs    04/19/21 0627 04/19/21 1645 04/20/21 0602  GLUCAP 128* 162* 139*  8/25- adequate control- con't regimen 11.  Hyperlipidemia.  Lipitor 12.  GERD.  Protonix 13.  CKD stage III.  Baseline creatinine 1.18-1.34.  Follow-up chemistries 14.  Obesity.  BMI 31.74.  Dietary follow-up 15.  COPD.  Quit smoking 33 years ago.  Check oxygen saturations every shift              -oxygen per [place] at 2L currently  16. Urinary retention with painful caths will insert foley and do another voiding trial in a week once mobility improves add flomax as well  17.  Left toe bruising in sensate foot, pt does not recall an injury , exam unremarkable check xray  8/24- has nondisplaced acute fracture of L 3rd middle phalanx- will tape to 2nd toe to help.  18. Constipation  8/24- will give Sorbitol 31ml at 3pm if no BM by then and follow with Fleet enema if needed  8/25- had BM- didn't need meds 19. Spasticity  8/25- changed Zanaflex to 6pm nightly-    I spent a total of 35 minutes on visit/total care- >50% on coordination  of care- discussing medication mgmt and upet about injections/bruising/pain- also making med changes in room with pt and listening/to her vent  LOS: 7 days A FACE TO FACE EVALUATION WAS PERFORMED  Jeily Guthridge 04/20/2021, 9:16 AM

## 2021-04-20 NOTE — Progress Notes (Signed)
Speech Language Pathology Discharge Summary  Patient Details  Name: Jade [name] MRN: [id number] Date of Birth: 07-11-1956  Today's Date: 04/20/2021 SLP Individual Time: 1315-1400 SLP Individual Time Calculation (min): 45 min  Skilled Therapeutic Interventions: Patient agreeable to skilled ST intervention with focus on cognitive goals. Session took place in mildly distracting environment in which patient did not appear increasingly distracted nor require any verbal re-direction. Patient recalled and demonstrated use of compensatory memory strategies with mod I during sequential memory and verbal reasoning task. Memory strategies included verbal repetition, and simple written cues. Attempted additional written support during calendar organization task in which patient exhibited decreased legibility as she had to write with her non-dominant hand. Discussed ways to compensate while still benefiting from memory strategies including inputting important dates and reminders into her ipad at home vs. writing everything down on a calendar. She also endorsed she plans to set an alarm clock as a reminder to take her medication. Patient completed complex problem solving with deduction type safety scenarios with mod I including requesting for repetition of prompts and additional processing time. Patient was left in wheelchair with alarm activated and immediate needs within reach at end of session. ST plans to sign off. Patient met all goals and endorses being at baseline cognitive status.   Patient has met 3 of 3 long term goals.  Patient to discharge at overall Modified Independent level.  Reasons goals not met: NA; all goals met   Clinical Impression/Discharge Summary: Patient has made functional gains and has met 3 of 3 long-term goals this admission due to improved selective attention, identifying and using compensatory memory strategies, and problem solving skills. Patient is currently overall mod I for  cognitive tasks and able to independently initiate modifications and/or compensations appropriately as needed. Patient also endorsed being at baseline function while reporting decreased short-term recall at baseline. She has been educated and able to verbalize and demonstrate understanding of beneficial memory strategies to compensate. Family education is not indicated. Patient reports her care partners (son and husband) are able provide the necessary physical and cognitive assistance at discharge. Follow up ST services are not clinically warranted at this time. ST to sign off. Patient verbalized understanding and agreement.  Care Partner:  Caregiver Able to Provide Assistance: Yes  Type of Caregiver Assistance: Cognitive;Physical Recommendation:  None     Equipment: NA   Reasons for discharge: Treatment goals met   Patient/Family Agrees with Progress Made and Goals Achieved: Yes  Clements Toro T Mauricia Mertens 04/20/2021, 3:37 PM

## 2021-04-20 NOTE — Progress Notes (Signed)
Physical Therapy Session Note  Patient Details  Name: Jade [name] MRN: [id number] Date of Birth: 1955-11-16  Today's Date: 04/20/2021 PT Individual Time: 1045-1200 PT Individual Time Calculation (min): 75 min   Short Term Goals: Week 1:  PT Short Term Goal 1 (Week 1): Pt will perform bed mobility with mod assist PT Short Term Goal 2 (Week 1): Pt will transfer to [facility] with mod Assist on SB PT Short Term Goal 3 (Week 1): pt will ambulate 45ft with max A +2 at rail in hall PT Short Term Goal 4 (Week 1): Pt will propell WC 11ft with mod assist  Skilled Therapeutic Interventions/Progress Updates:  Pt receive in wheelchair and agreeable to therapy session. Pt states she is feeling fatigued today and did not sleep well last night. Pt reports shoulder pain down to elbow RUE, non specific number. Notified RN.  Pt transported <> day room for gait training session.   STS x6 with LUE pull up assist on lite gait handle, CGA with right knee block (otherwise would require more assistance) throughout session.  Lite gait harness donned/doffed in standing with totalA from +2 while other therapist provided right knee block in standing, LUE support on lite gait handle. Verbal and tactile cuing for upright posture.   Gait training 64ft, 51ft, 29ft, 51ft in lite gait for partial body weight support and modA for RLE increased step length (pt able to complete rouhgly 50% of swing through) and prevent excessive adduction with external rotation. Mod assist to promote increased R hip/knee extension during stance phase, cuing throughout for upright posture with tactile cuing to right glute for increased activation. Assist with manual facilitation of bilateral weight shifting. Donned RUE Give mohr sling and RLE DF assist wrap after 76ft while in sitting. Donned compression/cross pattern to assist in tactile cuing Left knee 2/2 pt stating "it feels like it is going to hyperextend/give out"  during final bout. Pt states  improvement after left knee wrap.   Transported back to room. Pt in wheelchair with seat belt alarm placed and call bell within reach. Therapist donned RUE resting hand splint. NT rounding in room at the end of session. All needs met at this time.  Therapy Documentation Precautions:  Precautions Precautions: Fall Precaution Comments: RUE hemparesis with increased tone, diplopia Restrictions Weight Bearing Restrictions: No  Pain: Pain Assessment Pain Scale: Faces Faces Pain Scale: Hurts little more Pain Type: Acute pain;Chronic pain Pain Location: Shoulder Pain Orientation: Right Pain Descriptors / Indicators: Aching Pain Onset: With Activity Pain Intervention(s): Repositioned  Therapy/Group: Individual Therapy  Dashanti Burr, SPT  04/20/2021, 12:11 PM

## 2021-04-20 NOTE — Progress Notes (Signed)
Occupational Therapy Weekly Progress Note  Patient Details  Name: Jade [name] MRN: [id number] Date of Birth: 13-Jul-1956  Beginning of progress report period: April 14, 2021 End of progress report period: April 24, 2021  Today's Date: 04/20/2021 OT Individual Time: 0800-0902 OT Individual Time Calculation (min): 62 min    Patient has met 1 of 4 short term goals.  Jade [name] is making steady progress with OT at this time.  She currently complete UB selfcare at mod assist sitting unsupported EOB or in shower.  Lower body bathing is currently max assist with LB dressing at total assist sit to stand.  Increased pusher tendencies are still present to the right side during dynamic movements such as sit to stand and squat pivot transfers.  She continues with diplopia, mostly with gaze to the left of midline, but still intermittent in the right.  She is tolerating nasal occlusion on the right medial lens to assist with this.  LUE and RLE hemiparesis is still present as well with increased flexor tone noted in the right elbow and digits.  Increased tone as well in the shoulder extensors.  She currently needs max assist hand over hand assistance for integration as a stabilizer for selfcare tasks or for washing the RUE.  Sitting balance statically is at a min assist level overall with intervals of supervision.  Her endurance is limited and thus she tends to fall into a posterior pelvic tilt in sitting.  Mod to max assist was needed for dynamic sitting balance with squat pivot transfers as max assist and stand pivot at max to total assist.  Recommend continued CIR level OT in preparation for discharge home on 9/16 at projected min assist level.    Patient continues to demonstrate the following deficits: muscle weakness, muscle joint tightness, and muscle paralysis, impaired timing and sequencing, abnormal tone, unbalanced muscle activation, and decreased coordination, decreased visual motor skills,  decreased memory, and decreased sitting balance, decreased standing balance, decreased postural control, hemiplegia, and decreased balance strategies and therefore will continue to benefit from skilled OT intervention to enhance overall performance with BADL and Reduce care partner burden.  Patient progressing toward long term goals..  Continue plan of care.  OT Short Term Goals Week 2:  OT Short Term Goal 1 (Week 2): Pt will maintain dynamic sitting balance with min assist overall during completion of selfcare tasks. OT Short Term Goal 2 (Week 2): Pt will complete UB bathing with min assist for 2 consecutive sessions. OT Short Term Goal 2 - Progress (Week 2): Progressing toward goal OT Short Term Goal 3 (Week 2): Pt will complete LB bathing with max assist sit to stand.  Skilled Therapeutic Interventions/Progress Updates:    Session 1: (6010-9323) Pt completed supine to sit EOB with mod assist to begin session.  Worked on donning new brief and pants EOB.  Min assist for static sitting balance secondary to posterior lean.  Worked on donning brief and pants over her LEs with max assist following hemi techniques with total assist for standing to pull them up over her hips.  Total assist was also needed for donning TEDs with mod assist to donn her shoes already tied.  Completed stand pivot transfer to the wheelchair with max assist with completion or oral hygiene at the sink with setup from the wheelchair.  Therapist applied right resting hand splint to help position wrist and digits into extension secondary to increased tone noted.  RUE supported on pillows secondary to half lap  tray being too high.  Call button and phone in reach with safety alarm belt in place.    Session 2: (2353-6144)  Pt sitting in  wheelchair to start with reports of increased right shoulder and elbow pain.  Helped her complete stand pivot transfer to the bed with max assist and then transition to supine at mod assist for relief.   Performed shoulder stretches into flexion to approximately 90 degrees with increased pain and increased extensor tone noted.  Also completed 5-6 reps of elbow flexion/extension with pt assisting with the extension to help decrease tone.  NMES applied to the dorsal forearm as well for 6 mins at the parameters below in order to facilitate digit extension at level 9-10 on intensity.  No adverse reactions noted to brief stimulation.  Therapist provided right elbow protector as well as applying right resting hand splint for comfort.  Call button and phone in reach with safety alarm in place.    Therapy Documentation Precautions:  Precautions Precautions: Fall Precaution Comments: RUE hemparesis with increased tone, diplopia Restrictions Weight Bearing Restrictions: No  Pain: Pain Assessment Pain Scale: Faces Faces Pain Scale: Hurts little more Pain Type: Acute pain;Chronic pain Pain Location: Shoulder Pain Orientation: Right Pain Descriptors / Indicators: Aching Pain Onset: With Activity Pain Intervention(s): Repositioned ADL: See Care Tool Section for some details of mobility and selfcare  Therapy/Group: Individual Therapy  Kelson Queenan OTR/L 04/20/2021, 12:14 PM

## 2021-04-21 LAB — GLUCOSE, CAPILLARY
Glucose-Capillary: 148 mg/dL — ABNORMAL HIGH (ref 70–99)
Glucose-Capillary: 157 mg/dL — ABNORMAL HIGH (ref 70–99)
Glucose-Capillary: 158 mg/dL — ABNORMAL HIGH (ref 70–99)
Glucose-Capillary: 220 mg/dL — ABNORMAL HIGH (ref 70–99)

## 2021-04-21 MED ORDER — TIZANIDINE HCL 4 MG PO TABS: 4.0000 mg | ORAL_TABLET | Freq: Every day | ORAL | Status: DC

## 2021-04-21 MED ORDER — TIZANIDINE HCL 4 MG PO TABS
4.0000 mg | ORAL_TABLET | ORAL | Status: DC
  Administered 2021-04-21 – 2021-05-08 (×18): 4 mg via ORAL
  Filled 2021-04-21 (×18): qty 1

## 2021-04-21 MED ORDER — DOCUSATE SODIUM 100 MG PO CAPS
100.0000 mg | ORAL_CAPSULE | Freq: Two times a day (BID) | ORAL | Status: DC
  Administered 2021-04-21 – 2021-05-12 (×29): 100 mg via ORAL
  Filled 2021-04-21 (×39): qty 1

## 2021-04-21 MED ORDER — BACLOFEN 5 MG HALF TABLET: 5.0000 mg | ORAL_TABLET | Freq: Three times a day (TID) | ORAL | Status: DC

## 2021-04-21 MED ORDER — BACLOFEN 5 MG HALF TABLET
5.0000 mg | ORAL_TABLET | Freq: Three times a day (TID) | ORAL | Status: DC
  Administered 2021-04-21: 5 mg via ORAL
  Filled 2021-04-21 (×3): qty 1

## 2021-04-21 NOTE — Progress Notes (Signed)
Pt first dose of baclofen was started today, felt that had difficultly with speech and felt that movements were slow and heavy. Did not administer 2nd dose.

## 2021-04-21 NOTE — Progress Notes (Signed)
Occupational Therapy Session Note  Patient Details  Name: Jade [name] MRN: [id number] Date of Birth: Jan 31, 1956  Today's Date: 04/22/2021 OT Individual Time: 6333-5456 and 2563-8937 OT Individual Time Calculation (min): 57 min and 30 min  Skilled Therapeutic Interventions/Progress Updates:    Pt greeted in bed with no c/o pain. Declined participating in toileting or any other ADL related activity. Started session with checking in on pts psychosocial health. Pt teary during discussion, expressing that she felt very overwhelmed going home with family assistance, unsure if husband and son can provide needed assistance upon d/c. Pt was provided with emotional support, therapeutic listening, and encouragement. She appeared in brighter spirits after discussion however session focus still included a psychosocial wellbeing component. Worked on trunk control and Rt NMR while seated EOB, listening to meaningful music. CGA-Min A for sitting balance due to posterior lean/small LOBs. Worked on joint protection when using active assist techniques to include her arm into "dance" exercises. Lt>Rt weight shifts and small trunk rotations in beat to music, worked on forearm cradling during trunk movement. Note that pt was able to actively abduct her shoulder at times. Pt singing along to familiar music, affect very bright during this aspect of session. Per pt request, she returned to bed with +2 assistance and was boosted up. Left in care of NT to be set up to eat her lunch.   2nd Session 1:1 tx (30 min) Pt greeted in bed after lunch, requesting to use the restroom. Mod A for supine<sit and CGA for sit<stand in [place]. Worked on midline orientation, unsupported sitting balance, sit<stands, and Rt NMR during toilet transfer + toileting tasks. Pt required Total A of 1 for tasks with pt both incontinent + continent of bowel. CGA for sit<stands. Also worked on pt maintaining Rt hand grip on Stedy bar for [employer]. Total A  for donning pants at sit<stand level using hemi techniques and then pt transferred to the recliner via Stedy. She remained sitting up with all needs within reach, chair alarm set, and hemiplegic side protected.   Therapy Documentation Precautions:  Precautions Precautions: Fall Precaution Comments: RUE hemparesis with increased tone, diplopia Restrictions Weight Bearing Restrictions: No  ADL: ADL Eating: Supervision/safety Where Assessed-Eating: Bed level Grooming: Moderate assistance Where Assessed-Grooming: Edge of bed Upper Body Bathing: Maximal assistance Where Assessed-Upper Body Bathing: Edge of bed Lower Body Bathing: Dependent Where Assessed-Lower Body Bathing: Bed level, Edge of bed Upper Body Dressing: Maximal assistance Where Assessed-Upper Body Dressing: Edge of bed Lower Body Dressing: Dependent Where Assessed-Lower Body Dressing: Bed level, Edge of bed Toileting: Dependent Where Assessed-Toileting: Bedside Commode Toilet Transfer: Dependent Toilet Transfer Method: Stand pivot Acupuncturist: Drop arm bedside commode Tub/Shower Transfer: Not assessed Film/video editor: Not assessed     Therapy/Group: Individual Therapy  Abundio Miu 04/22/2021, 12:33 PM

## 2021-04-21 NOTE — Progress Notes (Signed)
Informed by NT that patient refused CHG bath

## 2021-04-21 NOTE — Progress Notes (Signed)
Occupational Therapy Session Note  Patient Details  Name: Jade [name] MRN: [id number] Date of Birth: February 01, 1956  Today's Date: 04/21/2021 OT Individual Time: 0800-0902 OT Individual Time Calculation (min): 62 min    Short Term Goals: Week 2:  OT Short Term Goal 1 (Week 2): Pt will maintain dynamic sitting balance with min assist overall during completion of selfcare tasks. OT Short Term Goal 2 (Week 2): Pt will complete UB bathing with min assist for 2 consecutive sessions. OT Short Term Goal 2 - Progress (Week 2): Progressing toward goal OT Short Term Goal 3 (Week 2): Pt will complete LB bathing with max assist sit to stand.  Skilled Therapeutic Interventions/Progress Updates:    Session 1: ([id number]) Pt completed bathing and dressing EOB during session.  Mod assist for supine to sit with supervision for static sitting balance.  She was able to complete UB bathing at min assist but continues to need max hand over hand for integration of the RUE into bathing tasks.  Mod demonstrational cueing with mod assist for sitting balance secondary to right lean.  Max assist for LB bathing sit to stand.  UB dressing was mod assist for donning pullover shirt with total assist for LB sit to stand.  Increased pusher tendencies to the left in standing with note inversion of the left foot when trying to help her stabilize.  Max instructional cueing to place the foot back on the ground and soften her left knee to let the leg relax slightly to allow for weightshift to the left.  Total assist for donning TEDs with max assist for shoes.  Finished session with transfer stand pivot to the wheelchair at total assist.  She was left sitting in wheelchair with nursing present and with the call button and safety belt in place.  Right resting hand splint in place with elbow protector as well.  Pt reports reduced pain in the RUE this session compared to yesterday.    Session 2: [phone])  Pt in bed to start with  reports of not feeling well secondary to taking the Baclofen earlier this morning.  Mod assist for transfer to the EOB with max assist for donning pants and shoes.  Max assist for stand pivot transfer to the wheelchair for transport down to the therapy gym.  She was able to complete squat pivot transfer to the therapy mat with max assist as well.  Worked on sit to stand and standing transitions with mirror for feedback.  Total +2 (pt 25 %) for sit to stand and standing secondary to increased pushing to the right and posterior lean.  Worked on having her reach forward and to the left while standing to help increase weight shift.  Finished session with squat pivot transfer back to the therapy mat with max assist and transfer back to the bed at the same level.  Mod assist for transition to supine with right resting hand splint donned for positioning.  Call button and phone in reach with safety alarm in place.    Therapy Documentation Precautions:  Precautions Precautions: Fall Precaution Comments: RUE hemparesis with increased tone, diplopia Restrictions Weight Bearing Restrictions: No  Pain: Pain Assessment Pain Scale: Faces Pain Score: 0-No pain ADL: See Care Tool Section for some details of mobility and selfcare  Therapy/Group: Individual Therapy  Fitzroy Mikami OTR/L 04/21/2021, 9:14 AM

## 2021-04-21 NOTE — Progress Notes (Signed)
Physical Therapy Session Note  Patient Details  Name: Jade [name] MRN: [id number] Date of Birth: 1956-06-04  Today's Date: 04/21/2021 PT Individual Time: 1000-1103; 2951-8841 PT Individual Time Calculation (min): 63 min; 33 min  Short Term Goals: Week 1:  PT Short Term Goal 1 (Week 1): Pt will perform bed mobility with mod assist PT Short Term Goal 2 (Week 1): Pt will transfer to [facility] with mod Assist on SB PT Short Term Goal 3 (Week 1): pt will ambulate 108ft with max A +2 at rail in hall PT Short Term Goal 4 (Week 1): Pt will propell WC 32ft with mod assist  Skilled Therapeutic Interventions/Progress Updates:  Session One: Pt received sitting in wheelchair and agreeable to therapy. Pt states she was able to sleep better however, feeling groggy this morning. Pt states she thinks it is due to change in medication.  Transported to day room for donning lite gait, pt states new pressure prevention dressing on inner thigh. Transitioned to gait training in hall 2/2 clinical decision to not use harness.   Pt states feeling unsteady/uneasy with gait. DF assist wrap donned in sitting. Donned compression/cross pattern to assist in tactile cuing Left knee.  standing 1x 45 sec + 1x59minDual task/distraction of locating objects in the mirror Pregait forward/backward step x8 each LE.  Gait training x49ft, x88ft, x10 ft in hall with Left hand railing  LUE support on handrail, up to heavy ModA with R knee bock and facilitation of hip extension. Pt able tp initiate and complete ~50% of swing phase. Manual facilitation of RLE placement to prevent excessive ER. Multi modal cuing for upright posture and weight shifting. Mirror for visual feedback. Pt required frequent rest breaks this session 2/2 fatigue, chronic discomfort in left knee, and sx of lightheaded. BP taken in session within pt's norm. Pt education provided on recovery process, pt requiring heavy encouragement during session for recognition of  progress made while here.   Pt in wheelchair with seatbelt alarm on, call bell within reach. All needs met at this time.   Session Two:  Pt in wheelchair and agreeable to therapy. Pt states she needs to use the restroom.   Multiple STS in steady with ModA to boost/trunk control to prevent pushing towards right, LUE pull assist throughout session. Transfer to commode via stedy. Pt continent of bowel. Lower body clothing management TotalA. Noted blood on anterior brief, totalA to find source and cleaned area with sterilized wipe and dressing applied. Source appears to be from location of blood thinner injection. TotalA for peri care with +2 Min assistance to maintain standing balance 2/2 pusher tendency. Upon return to wheelchair patient states discomfort and concern with "pulling" sensation near foley insertion. While therapist inspected, multimodal cuing for midline orientation in standing and in high perched position. Could not find source. Pt states she would like to have RN check the foley before continuing therapy. Pt EOB via stedy.   EOB > Supine MaxA. +2 for repositioning towards HOB. Pt positioned with RUE supported.  Pt in bed with bed alarm on, call bell within reach, and RN notified of pt concern with foley.  Therapy Documentation Precautions:  Precautions Precautions: Fall Precaution Comments: RUE hemparesis with increased tone, diplopia Restrictions Weight Bearing Restrictions: No  Pain: Pain Assessment Pain Scale: 0-10 Pain Score: 0-No pain Faces Pain Scale: Hurts a little bit Pain Type: Chronic pain Pain Location: Knee Pain Orientation: Left Pain Descriptors / Indicators: Aching    Therapy/Group: Individual Therapy  Verline Lema  Raybon Conard, SPT 04/21/2021, 12:14 PM

## 2021-04-22 LAB — GLUCOSE, CAPILLARY
Glucose-Capillary: 190 mg/dL — ABNORMAL HIGH (ref 70–99)
Glucose-Capillary: 207 mg/dL — ABNORMAL HIGH (ref 70–99)
Glucose-Capillary: 120 mg/dL — ABNORMAL HIGH (ref 70–99)
Glucose-Capillary: 201 mg/dL — ABNORMAL HIGH (ref 70–99)
Glucose-Capillary: 126 mg/dL — ABNORMAL HIGH (ref 70–99)

## 2021-04-22 MED ORDER — DANTROLENE SODIUM 25 MG PO CAPS
25.0000 mg | ORAL_CAPSULE | Freq: Every day | ORAL | Status: DC
  Administered 2021-04-22 – 2021-04-25 (×4): 25 mg via ORAL
  Filled 2021-04-22 (×5): qty 1

## 2021-04-22 MED ORDER — CHLORHEXIDINE GLUCONATE CLOTH 2 % EX PADS
6.0000 | MEDICATED_PAD | Freq: Every day | CUTANEOUS | Status: DC
  Administered 2021-04-23 – 2021-05-02 (×10): 6 via TOPICAL

## 2021-04-22 MED ORDER — DANTROLENE SODIUM 25 MG PO CAPS
50.0000 mg | ORAL_CAPSULE | Freq: Every day | ORAL | Status: DC
  Filled 2021-04-22: qty 2

## 2021-04-22 NOTE — Progress Notes (Signed)
Patient started new order of dantrolene 50 mg and patient stated that she did not want to start of a high dose. MD called and made aware. Verbal order to decrease medication to 25 mg. New order placed in epic to start tonight due to patient not receiving medication. Cletis Media, LPN

## 2021-04-22 NOTE — Progress Notes (Signed)
PROGRESS NOTE   Subjective/Complaints:  Pt reports had weird affect with Baclofen- felt like speech slurred and felt weak- not able to do therapy the same amount- OT didn't notice per pt.    ROS-   Pt denies SOB, abd pain, CP, N/V/C/D, and vision changes   Objective:   No results found. No results for input(s): WBC, HGB, HCT, PLT in the last 72 hours.  No results for input(s): NA, K, CL, CO2, GLUCOSE, BUN, CREATININE, CALCIUM in the last 72 hours.   Intake/Output Summary (Last 24 hours) at 04/22/2021 1554 Last data filed at 04/22/2021 1258 Gross per 24 hour  Intake 90 ml  Output 1025 ml  Net -935 ml        Physical Exam: Vital Signs Blood pressure 139/84, pulse (!) 102, temperature 98.2 F (36.8 C), temperature source Oral, resp. rate 17, height 5\' 1"  (1.549 m), weight 76.2 kg, SpO2 98 %.     General: awake, alert, appropriate, on toilet in bathroom, NT in room; NAD HENT: conjugate gaze; oropharynx moist; spot/scab on head- no change CV: regular rate; no JVD Pulmonary: CTA B/L; no W/R/R- good air movement GI: soft, NT, ND, (+)BS Psychiatric: appropriate; interactive Neurological: alert Skin: bleeding scab on front part of scalp Neurologic: Cranial nerves II through XII intact, motor strength is 5/5 in left deltoid, bicep, tricep, grip, hip flexor, knee extensors, ankle dorsiflexor and plantar flexor Trace Right shoulder retraction and knee ext o/w 0- wearing R wrist resting night splint.  Sensory exam normal sensation to light touch and proprioception in bilateral upper and lower extremities  Musculoskeletal: Full range of motion in all 4 extremities. No joint swelling, no pain to palp Left great toe no pain with ROM,    Assessment/Plan: 1. Functional deficits which require 3+ hours per day of interdisciplinary therapy in a comprehensive inpatient rehab setting. Physiatrist is providing close team  supervision and 24 hour management of active medical problems listed below. Physiatrist and rehab team continue to assess barriers to discharge/monitor patient progress toward functional and medical goals  Care Tool:  Bathing    Body parts bathed by patient: Chest, Abdomen, Right arm, Right upper leg, Left upper leg, Face, Left lower leg   Body parts bathed by helper: Right lower leg, Buttocks, Front perineal area, Left arm     Bathing assist Assist Level: Total Assistance - Patient < 25%     Upper Body Dressing/Undressing Upper body dressing   What is the patient wearing?: Pull over shirt    Upper body assist Assist Level: Maximal Assistance - Patient 25 - 49%    Lower Body Dressing/Undressing Lower body dressing      What is the patient wearing?: Pants, Incontinence brief     Lower body assist Assist for lower body dressing: Total Assistance - Patient < 25%     Toileting Toileting    Toileting assist Assist for toileting: Dependent - Patient 0% (1 assist using Stedy)     Transfers Chair/bed transfer  Transfers assist     Chair/bed transfer assist level: Maximal Assistance - Patient 25 - 49%     Locomotion Ambulation   Ambulation assist   Ambulation  activity did not occur: Safety/medical concerns  Assist level: Total Assistance - Patient < 25% Assistive device: Lite Gait Max distance: 123ft   Walk 10 feet activity   Assist  Walk 10 feet activity did not occur: Safety/medical concerns        Walk 50 feet activity   Assist Walk 50 feet with 2 turns activity did not occur: Safety/medical concerns         Walk 150 feet activity   Assist Walk 150 feet activity did not occur: Safety/medical concerns         Walk 10 feet on uneven surface  activity   Assist Walk 10 feet on uneven surfaces activity did not occur: Safety/medical concerns         Wheelchair     Assist Is the patient using a wheelchair?: Yes      Wheelchair  assist level: Dependent - Patient 0% Max wheelchair distance: 50    Wheelchair 50 feet with 2 turns activity    Assist        Assist Level: Dependent - Patient 0%   Wheelchair 150 feet activity     Assist      Assist Level: Dependent - Patient 0%   Blood pressure 139/84, pulse (!) 102, temperature 98.2 F (36.8 C), temperature source Oral, resp. rate 17, height 5\' 1"  (1.549 m), weight 76.2 kg, SpO2 98 %.  Medical Problem List and Plan: 1.  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  Antithrombotics: -DVT/anticoagulation: Subcutaneous heparin   8/25- changed to Lovenox 40 mg qday since CrCl is >40             -antiplatelet therapy: Aspirin 325 mg daily and Plavix 75 mg daily x3 months then Plavix alone 3. Pain Management: Neurontin 200 mg nightly, oxycodone as needed, Flexeril as needed  8/24- pain controlled- con't regimen  8/25- pt insists to stop gabapentin- will d/c.   8/27- pain controlled except for spasticity Sx's- as below 4. Mood: Provide emotional support             -antipsychotic agents: N/A 5. Neuropsych: This patient is capable of making decisions on her own behalf. 6. Skin/Wound Care: Routine skin checks  8/25- has a scab for "years' on front of scalp- needs Derm after d/c 7. Fluids/Electrolytes/Nutrition: Routine and outs with follow-up chemistries ordered 8.  Symptomatic left ICA stenosis.  Status post left transcarotid artery revascularization 04/06/2021.  Vascular surgery following 9.  Hypertension.  Norvasc 10 mg daily,  HCTZ 12.5 mg daily.  Monitor with increased mobility Vitals:   04/22/21 0529 04/22/21 1258  BP: (!) 124/57 139/84  Pulse: 64 (!) 102  Resp: 18 17  Temp: 98.6 F (37 C) 98.2 F (36.8 C)  SpO2: 98% 98%   Right M2 stenosis would avoid low Bps  8/27- BP running  1`20s-140s systolic- con't regimen 10.  Diabetes mellitus.  Hemoglobin A1c 6.5.  SSI.             -monitor CBG's AC/HS CBG (last 3)  Recent Labs    04/22/21 0533 04/22/21 0535 04/22/21 1127  GLUCAP 190* 207* 120*   8/27- BG's somewhat elevated- but will monitor for trends 11.  Hyperlipidemia.  Lipitor 12.  GERD.  Protonix 13.  CKD stage III.  Baseline creatinine 1.18-1.34.  Follow-up chemistries 14.  Obesity.  BMI 31.74.  Dietary follow-up 15.  COPD.  Quit smoking 33 years ago.  Check oxygen saturations every shift              -oxygen per [place] at 2L currently  16. Urinary retention with painful caths will insert foley and do another voiding trial in a week once mobility improves add flomax as well  17.  Left toe bruising in sensate foot, pt does not recall an injury , exam unremarkable check xray  8/24- has nondisplaced acute fracture of L 3rd middle phalanx- will tape to 2nd toe to help.  18. Constipation  8/24- will give Sorbitol 14ml at 3pm if no BM by then and follow with Fleet enema if needed  8/27- LBM this AM 19. Spasticity  8/25- changed Zanaflex to 6pm nightly-  `8/27- attempted Baclofen 5 mg TID_ didn't work- will try Dantrolene 50 mg QHS/6pm to see if can help spasticity- cannot tolerate Zanaflex during day.     LOS: 9 days A FACE TO FACE EVALUATION WAS PERFORMED  Jade [name] 04/22/2021, 3:54 PM

## 2021-04-22 NOTE — Plan of Care (Signed)
  Problem: Consults Goal: RH STROKE PATIENT EDUCATION Description: See Patient Education module for education specifics  Outcome: Progressing   Problem: RH BOWEL ELIMINATION Goal: RH STG MANAGE BOWEL WITH ASSISTANCE Description: STG Manage Bowel with mod I Assistance. Outcome: Progressing Goal: RH STG MANAGE BOWEL W/MEDICATION W/ASSISTANCE Description: STG Manage Bowel with Medication with mod I  Assistance. Outcome: Progressing   Problem: RH SAFETY Goal: RH STG ADHERE TO SAFETY PRECAUTIONS W/ASSISTANCE/DEVICE Description: STG Adhere to Safety Precautions With cues/ reminders Assistance/Device. Outcome: Progressing   Problem: RH KNOWLEDGE DEFICIT Goal: RH STG INCREASE KNOWLEDGE OF DIABETES Description: Patient will be able to manage DM with medications and dietary modifications using handouts and educational resources with cues/reminders  Outcome: Progressing Goal: RH STG INCREASE KNOWLEDGE OF HYPERTENSION Description: Patient will be able to manage HTN with medications and dietary modifications using handouts and educational resources with cues/reminders  Outcome: Progressing Goal: RH STG INCREASE KNOWLEGDE OF HYPERLIPIDEMIA Description: Patient will be able to manage HLD with medications and dietary modifications using handouts and educational resources with cues/reminders  Outcome: Progressing Goal: RH STG INCREASE KNOWLEDGE OF STROKE PROPHYLAXIS Description: Patient will be able to manage secondary stroke risks with medications and dietary modifications using handouts and educational resources with cues/reminders  Outcome: Progressing   Problem: RH Vision Goal: RH LTG Vision (Specify) Outcome: Progressing   

## 2021-04-23 LAB — GLUCOSE, CAPILLARY
Glucose-Capillary: 148 mg/dL — ABNORMAL HIGH (ref 70–99)
Glucose-Capillary: 144 mg/dL — ABNORMAL HIGH (ref 70–99)
Glucose-Capillary: 175 mg/dL — ABNORMAL HIGH (ref 70–99)
Glucose-Capillary: 134 mg/dL — ABNORMAL HIGH (ref 70–99)

## 2021-04-23 NOTE — Progress Notes (Signed)
PROGRESS NOTE   Subjective/Complaints:  Pt reports no side effects from dantrolene 25 mg last night- poor sleep due to another pt screaming all night.  When passing gas, is having a small BM- of note.  Tone also in RLE- knee draws up.     ROS-   Pt denies SOB, abd pain, CP, N/V/C/D, and vision changes   Objective:   No results found. No results for input(s): WBC, HGB, HCT, PLT in the last 72 hours.  No results for input(s): NA, K, CL, CO2, GLUCOSE, BUN, CREATININE, CALCIUM in the last 72 hours.   Intake/Output Summary (Last 24 hours) at 04/23/2021 1544 Last data filed at 04/23/2021 1220 Gross per 24 hour  Intake 446 ml  Output 1975 ml  Net -1529 ml        Physical Exam: Vital Signs Blood pressure (!) 153/79, pulse 98, temperature 98.3 F (36.8 C), temperature source Oral, resp. rate 17, height 5\' 1"  (1.549 m), weight 76.2 kg, SpO2 100 %.      General: awake, alert, appropriate, sitting up in bed; wearing eye mask on head; NAD HENT: conjugate gaze; oropharynx moist CV: regular rate; no JVD Pulmonary: CTA B/L; no W/R/R- good air movement GI: soft, NT, ND, (+)BS Psychiatric: appropriate; specific about interactions Neurological: Ox3 R knee drawing up slightly- no clonus; MAS of  2- almost 3 in RUE- not wearing resting night splint;  Skin: bleeding scab on front part of scalp Neurologic: Cranial nerves II through XII intact, motor strength is 5/5 in left deltoid, bicep, tricep, grip, hip flexor, knee extensors, ankle dorsiflexor and plantar flexor Trace Right shoulder retraction and knee ext o/w 0- wearing R wrist resting night splint.  Sensory exam normal sensation to light touch and proprioception in bilateral upper and lower extremities  Musculoskeletal: Full range of motion in all 4 extremities. No joint swelling, no pain to palp Left great toe no pain with ROM,    Assessment/Plan: 1. Functional  deficits which require 3+ hours per day of interdisciplinary therapy in a comprehensive inpatient rehab setting. Physiatrist is providing close team supervision and 24 hour management of active medical problems listed below. Physiatrist and rehab team continue to assess barriers to discharge/monitor patient progress toward functional and medical goals  Care Tool:  Bathing    Body parts bathed by patient: Chest, Abdomen, Right arm, Right upper leg, Left upper leg, Face, Left lower leg   Body parts bathed by helper: Right lower leg, Buttocks, Front perineal area, Left arm     Bathing assist Assist Level: Total Assistance - Patient < 25%     Upper Body Dressing/Undressing Upper body dressing   What is the patient wearing?: Pull over shirt    Upper body assist Assist Level: Maximal Assistance - Patient 25 - 49%    Lower Body Dressing/Undressing Lower body dressing      What is the patient wearing?: Pants, Incontinence brief     Lower body assist Assist for lower body dressing: Total Assistance - Patient < 25%     Toileting Toileting    Toileting assist Assist for toileting: Dependent - Patient 0% (1 assist using Stedy)  Transfers Chair/bed transfer  Transfers assist     Chair/bed transfer assist level: Maximal Assistance - Patient 25 - 49%     Locomotion Ambulation   Ambulation assist   Ambulation activity did not occur: Safety/medical concerns  Assist level: Total Assistance - Patient < 25% Assistive device: Lite Gait Max distance: 142ft   Walk 10 feet activity   Assist  Walk 10 feet activity did not occur: Safety/medical concerns        Walk 50 feet activity   Assist Walk 50 feet with 2 turns activity did not occur: Safety/medical concerns         Walk 150 feet activity   Assist Walk 150 feet activity did not occur: Safety/medical concerns         Walk 10 feet on uneven surface  activity   Assist Walk 10 feet on uneven  surfaces activity did not occur: Safety/medical concerns         Wheelchair     Assist Is the patient using a wheelchair?: Yes      Wheelchair assist level: Dependent - Patient 0% Max wheelchair distance: 50    Wheelchair 50 feet with 2 turns activity    Assist        Assist Level: Dependent - Patient 0%   Wheelchair 150 feet activity     Assist      Assist Level: Dependent - Patient 0%   Blood pressure (!) 153/79, pulse 98, temperature 98.3 F (36.8 C), temperature source Oral, resp. rate 17, height 5\' 1"  (1.549 m), weight 76.2 kg, SpO2 100 %.  Medical Problem List and Plan: 1.  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  Antithrombotics: -DVT/anticoagulation: Subcutaneous heparin   8/25- changed to Lovenox 40 mg qday since CrCl is >40             -antiplatelet therapy: Aspirin 325 mg daily and Plavix 75 mg daily x3 months then Plavix alone 3. Pain Management: Neurontin 200 mg nightly, oxycodone as needed, Flexeril as needed  8/24- pain controlled- con't regimen  8/25- pt insists to stop gabapentin- will d/c.   8/27- pain controlled except for spasticity Sx's- as below 4. Mood: Provide emotional support             -antipsychotic agents: N/A 5. Neuropsych: This patient is capable of making decisions on her own behalf. 6. Skin/Wound Care: Routine skin checks  8/25- has a scab for "years' on front of scalp- needs Derm after d/c 7. Fluids/Electrolytes/Nutrition: Routine and outs with follow-up chemistries  ordered 8.  Symptomatic left ICA stenosis.  Status post left transcarotid artery revascularization 04/06/2021.  Vascular surgery following 9.  Hypertension.  Norvasc 10 mg daily, HCTZ 12.5 mg daily.  Monitor with increased mobility Vitals:   04/23/21 0320 04/23/21 1441  BP: 04/25/21)  149/59 (!) 153/79  Pulse: 63 98  Resp: 18 17  Temp: 98.1 F (36.7 C) 98.3 F (36.8 C)  SpO2: 99% 100%   Right M2 stenosis would avoid low Bps  8/28- BP running s130s-low 150s- will con't regimen 10.  Diabetes mellitus.  Hemoglobin A1c 6.5.  SSI.             -monitor CBG's AC/HS CBG (last 3)  Recent Labs    04/22/21 2109 04/23/21 0615 04/23/21 1129  GLUCAP 126* 148* 144*   8/28- Bgs' running 120s-140s- con't regimen 11.  Hyperlipidemia.  Lipitor 12.  GERD.  Protonix 13.  CKD stage III.  Baseline creatinine 1.18-1.34.  Follow-up chemistries  8/28- labs q monday 14.  Obesity.  BMI 31.74.  Dietary follow-up 15.  COPD.  Quit smoking 33 years ago.  Check oxygen saturations every shift              -oxygen per [place] at 2L currently  16. Urinary retention with painful caths will insert foley and do another voiding trial in a week once mobility improves add flomax as well  17.  Left toe bruising in sensate foot, pt does not recall an injury , exam unremarkable check xray  8/24- has nondisplaced acute fracture of L 3rd middle phalanx- will tape to 2nd toe to help.  18. Constipation  8/24- will give Sorbitol 7ml at 3pm if no BM by then and follow with Fleet enema if needed  8/27- LBM this AM 19. Spasticity  8/25- changed Zanaflex to 6pm nightly-  `8/27- attempted Baclofen 5 mg TID_ didn't work- will try Dantrolene 50 mg QHS/6pm to see if can help spasticity- cannot tolerate Zanaflex during day.   8/28- will add baclofen to allergy list- pt insisted to try 25 mg QHS/6pm last night- no side effects- will need to be on at least 3-4 days before increasing to 50 mg QHS.     LOS: 10 days A FACE TO FACE EVALUATION WAS  PERFORMED  Hasten Sweitzer 04/23/2021, 3:44 PM

## 2021-04-23 NOTE — Plan of Care (Signed)
  Problem: Consults Goal: RH STROKE PATIENT EDUCATION Description: See Patient Education module for education specifics  Outcome: Progressing   Problem: RH BOWEL ELIMINATION Goal: RH STG MANAGE BOWEL WITH ASSISTANCE Description: STG Manage Bowel with mod I Assistance. Outcome: Progressing Goal: RH STG MANAGE BOWEL W/MEDICATION W/ASSISTANCE Description: STG Manage Bowel with Medication with mod I  Assistance. Outcome: Progressing   Problem: RH SAFETY Goal: RH STG ADHERE TO SAFETY PRECAUTIONS W/ASSISTANCE/DEVICE Description: STG Adhere to Safety Precautions With cues/ reminders Assistance/Device. Outcome: Progressing   Problem: RH KNOWLEDGE DEFICIT Goal: RH STG INCREASE KNOWLEDGE OF DIABETES Description: Patient will be able to manage DM with medications and dietary modifications using handouts and educational resources with cues/reminders  Outcome: Progressing Goal: RH STG INCREASE KNOWLEDGE OF HYPERTENSION Description: Patient will be able to manage HTN with medications and dietary modifications using handouts and educational resources with cues/reminders  Outcome: Progressing Goal: RH STG INCREASE KNOWLEGDE OF HYPERLIPIDEMIA Description: Patient will be able to manage HLD with medications and dietary modifications using handouts and educational resources with cues/reminders  Outcome: Progressing Goal: RH STG INCREASE KNOWLEDGE OF STROKE PROPHYLAXIS Description: Patient will be able to manage secondary stroke risks with medications and dietary modifications using handouts and educational resources with cues/reminders  Outcome: Progressing   Problem: RH Vision Goal: RH LTG Vision (Specify) Outcome: Progressing

## 2021-04-24 DIAGNOSIS — G8110 Spastic hemiplegia affecting unspecified side: Secondary | ICD-10-CM

## 2021-04-24 DIAGNOSIS — R339 Retention of urine, unspecified: Secondary | ICD-10-CM

## 2021-04-24 DIAGNOSIS — K5901 Slow transit constipation: Secondary | ICD-10-CM

## 2021-04-24 LAB — CBC WITH DIFFERENTIAL/PLATELET
Abs Immature Granulocytes: 0.04 10*3/uL (ref 0.00–0.07)
Basophils Absolute: 0.1 10*3/uL (ref 0.0–0.1)
Basophils Relative: 1 %
Eosinophils Absolute: 0.3 10*3/uL (ref 0.0–0.5)
Eosinophils Relative: 3 %
HCT: 36.4 % (ref 36.0–46.0)
Hemoglobin: 12 g/dL (ref 12.0–15.0)
Immature Granulocytes: 0 %
Lymphocytes Relative: 22 %
Lymphs Abs: 2.2 10*3/uL (ref 0.7–4.0)
MCH: 32.3 pg (ref 26.0–34.0)
MCHC: 33 g/dL (ref 30.0–36.0)
MCV: 97.8 fL (ref 80.0–100.0)
Monocytes Absolute: 0.7 10*3/uL (ref 0.1–1.0)
Monocytes Relative: 7 %
Neutro Abs: 6.8 10*3/uL (ref 1.7–7.7)
Neutrophils Relative %: 67 %
Platelets: 387 10*3/uL (ref 150–400)
RBC: 3.72 MIL/uL — ABNORMAL LOW (ref 3.87–5.11)
RDW: 14.2 % (ref 11.5–15.5)
WBC: 10.1 10*3/uL (ref 4.0–10.5)
nRBC: 0 % (ref 0.0–0.2)

## 2021-04-24 LAB — BASIC METABOLIC PANEL
Anion gap: 8 (ref 5–15)
BUN: 17 mg/dL (ref 8–23)
CO2: 25 mmol/L (ref 22–32)
Calcium: 9.7 mg/dL (ref 8.9–10.3)
Chloride: 104 mmol/L (ref 98–111)
Creatinine, Ser: 1.17 mg/dL — ABNORMAL HIGH (ref 0.44–1.00)
GFR, Estimated: 51 mL/min — ABNORMAL LOW (ref >60)
Glucose, Bld: 143 mg/dL — ABNORMAL HIGH (ref 70–99)
Potassium: 3.8 mmol/L (ref 3.5–5.1)
Sodium: 137 mmol/L (ref 135–145)

## 2021-04-24 LAB — GLUCOSE, CAPILLARY
Glucose-Capillary: 67 mg/dL — ABNORMAL LOW (ref 70–99)
Glucose-Capillary: 182 mg/dL — ABNORMAL HIGH (ref 70–99)
Glucose-Capillary: 165 mg/dL — ABNORMAL HIGH (ref 70–99)
Glucose-Capillary: 163 mg/dL — ABNORMAL HIGH (ref 70–99)
Glucose-Capillary: 178 mg/dL — ABNORMAL HIGH (ref 70–99)

## 2021-04-24 NOTE — Progress Notes (Signed)
Hypoglycemic Event  CBG: 67  Treatment: 8 oz juice/soda  Symptoms: None  Follow-up CBG: [id number] CBG Result:182  Possible Reasons for Event: Unknown  Comments/MD notified:    April Holding

## 2021-04-24 NOTE — Progress Notes (Signed)
Physical Therapy Session Note  Patient Details  Name: Jade [name] MRN: [id number] Date of Birth: August 09, 1956  Today's Date: 04/24/2021 PT Individual Time: 1100-1155 PT Individual Time Calculation (min): 55 min   Short Term Goals: Week 1:  PT Short Term Goal 1 (Week 1): Pt will perform bed mobility with mod assist PT Short Term Goal 2 (Week 1): Pt will transfer to [facility] with mod Assist on SB PT Short Term Goal 3 (Week 1): pt will ambulate 85ft with max A +2 at rail in hall PT Short Term Goal 4 (Week 1): Pt will propell WC 72ft with mod assist  Skilled Therapeutic Interventions/Progress Updates:     Pt seated in w/c to start. Agreeable to PT tx without reports of pain. Pt wheeled to main rehab gym for time. Squat<>pivot transfer with maxA from w/c to mat table towards her stronger L side - poor mechanics and body awareness during squat pivot transfers with significant trunk extension, poor forward weight shift, and decreased ability to translate hips laterally. At edge of mat, worked on body awareness with slumped sitting to tall sitting, forward leans (bowing), and lateral scooting along mat table. Able to progress for max to minA for lateral scoots in both directions, but inconsistent with performance. Pt then completed squat<>pivot with modA back to her w/c and wheeled to hallway to focus remainder of session on NMR gait training. Ace wrapped R foot for DF assist. With +2 assist for w/c and using L hand rail, she ambulated 61ft + 31ft on level surfaces - required assist for 50% of RLE swing, R foot placement, knee blocking in stance, and lateral weight shifting to offload for gait facilitation. Required modA overall. Pt wheeled back to her room and remained seated in w/c with all needs in reach, safety belt alarm on, pt made comfortable.   Therapy Documentation Precautions:  Precautions Precautions: Fall Precaution Comments: RUE hemparesis with increased tone, diplopia Restrictions Weight  Bearing Restrictions: No General:     Therapy/Group: Individual Therapy  Orrin Brigham 04/24/2021, 7:44 AM

## 2021-04-24 NOTE — Progress Notes (Signed)
Physical Therapy Session Note  Patient Details  Name: Jade [name] MRN: [id number] Date of Birth: 26-Apr-1956  Today's Date: 04/24/2021 PT Individual Time: 0093-8182 PT Individual Time Calculation (min): 60 min   Short Term Goals: Week 2:     Skilled Therapeutic Interventions/Progress Updates: Pt presents supine in bed just finishing w/ nursing care.  Pt agreeable to therapy.  Pt required mod A for partial roll to Left and then transfer to sit.  PT educating on logroll but required mod A for maintaining R knee flexion.  Pt sat EOB for Total A to don shoes w/ occasional list to right, but able to regain balance w/ CGA and cueing.  Pt performed multiple modified stand pivot transfers bed<> w/c <> mat table w/ mod A but verbal and visual cueing.  T performed the final transfer w/c > bed to R using side rail once standing and blocking R knee, able to step-pivot to bed.  Pt performed seated trunk strengthening of linked fingers reaching down between legs, overhead w/ PT assist for R elbow , and crossing body reaching forward.  Pt requires verbal cues for upright posture.  Pt returned to room and in bed .  Pt required mod to max A for log roll to L elbow/FA and for R LE.  Pt able to scoot LEs onto middle of bed.  Bed alarm on and all needs in reach.     Therapy Documentation Precautions:  Precautions Precautions: Fall Precaution Comments: RUE hemparesis with increased tone, diplopia Restrictions Weight Bearing Restrictions: No General:   Vital Signs: Therapy Vitals Temp: 98.2 F (36.8 C) Temp Source: Oral Pulse Rate: 94 Resp: 17 BP: (!) 190/91 Patient Position (if appropriate): Sitting Oxygen Therapy SpO2: 100 % O2 Device: Room Air Pain:0/10       Therapy/Group: Individual Therapy  Lucio Edward 04/24/2021, 3:11 PM

## 2021-04-24 NOTE — Progress Notes (Signed)
PROGRESS NOTE   Subjective/Complaints:  Up in shower with OT. No problems this morning   ROS: Patient denies fever, rash, sore throat, blurred vision, nausea, vomiting, diarrhea, cough, shortness of breath or chest pain, joint or back pain, headache, or mood change.    Objective:   No results found. Recent Labs    04/24/21 0623  WBC 10.1  HGB 12.0  HCT 36.4  PLT 387    Recent Labs    04/24/21 0623  NA 137  K 3.8  CL 104  CO2 25  GLUCOSE 143*  BUN 17  CREATININE 1.17*  CALCIUM 9.7     Intake/Output Summary (Last 24 hours) at 04/24/2021 1358 Last data filed at 04/24/2021 0700 Gross per 24 hour  Intake 236 ml  Output 525 ml  Net -289 ml        Physical Exam: Vital Signs Blood pressure (!) 190/91, pulse 94, temperature 98.2 F (36.8 C), temperature source Oral, resp. rate 17, height 5\' 1"  (1.549 m), weight 76.2 kg, SpO2 100 %.      Constitutional: No distress . Vital signs reviewed. HEENT: NCAT, EOMI, oral membranes moist Neck: supple Cardiovascular: RRR without murmur. No JVD    Respiratory/Chest: CTA Bilaterally without wheezes or rales. Normal effort    GI/Abdomen: BS +, non-tender, non-distended Ext: no clubbing, cyanosis, or edema Psych: pleasant and cooperative    Skin: bleeding scab on front part of scalp Neurologic: Cranial nerves II through XII intact, motor strength is 5/5 in left deltoid, bicep, tricep, grip, hip flexor, knee extensors, ankle dorsiflexor and plantar flexor RUE/RLE hypertonicity. 2-3/4 Sensory exam normal sensation to light touch and proprioception in bilateral upper and lower extremities Musculoskeletal: Full range of motion in all 4 extremities. No joint swelling, no pain to palp Left great toe no pain with ROM,    Assessment/Plan: 1. Functional deficits which require 3+ hours per day of interdisciplinary therapy in a comprehensive inpatient rehab  setting. Physiatrist is providing close team supervision and 24 hour management of active medical problems listed below. Physiatrist and rehab team continue to assess barriers to discharge/monitor patient progress toward functional and medical goals  Care Tool:  Bathing    Body parts bathed by patient: Chest, Abdomen, Right arm, Right upper leg, Left upper leg, Face, Left lower leg   Body parts bathed by helper: Right lower leg, Buttocks, Front perineal area, Left arm     Bathing assist Assist Level: Total Assistance - Patient < 25%     Upper Body Dressing/Undressing Upper body dressing   What is the patient wearing?: Pull over shirt    Upper body assist Assist Level: Maximal Assistance - Patient 25 - 49%    Lower Body Dressing/Undressing Lower body dressing      What is the patient wearing?: Pants, Incontinence brief     Lower body assist Assist for lower body dressing: Total Assistance - Patient < 25%     Toileting Toileting    Toileting assist Assist for toileting: Dependent - Patient 0% (1 assist using Stedy)     Transfers Chair/bed transfer  Transfers assist     Chair/bed transfer assist level: Maximal Assistance - Patient  25 - 49%     Locomotion Ambulation   Ambulation assist   Ambulation activity did not occur: Safety/medical concerns  Assist level: Total Assistance - Patient < 25% Assistive device: Lite Gait Max distance: 19ft   Walk 10 feet activity   Assist  Walk 10 feet activity did not occur: Safety/medical concerns        Walk 50 feet activity   Assist Walk 50 feet with 2 turns activity did not occur: Safety/medical concerns         Walk 150 feet activity   Assist Walk 150 feet activity did not occur: Safety/medical concerns         Walk 10 feet on uneven surface  activity   Assist Walk 10 feet on uneven surfaces activity did not occur: Safety/medical concerns         Wheelchair     Assist Is the  patient using a wheelchair?: Yes      Wheelchair assist level: Dependent - Patient 0% Max wheelchair distance: 50    Wheelchair 50 feet with 2 turns activity    Assist        Assist Level: Dependent - Patient 0%   Wheelchair 150 feet activity     Assist      Assist Level: Dependent - Patient 0%   Blood pressure (!) 190/91, pulse 94, temperature 98.2 F (36.8 C), temperature source Oral, resp. rate 17, height 5\' 1"  (1.549 m), weight 76.2 kg, SpO2 100 %.  Medical Problem List and Plan: 1.  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  Antithrombotics: -DVT/anticoagulation: Subcutaneous heparin   8/25- changed to Lovenox 40 mg qday since CrCl is >40             -antiplatelet therapy: Aspirin 325 mg daily and Plavix 75 mg daily x3 months then Plavix alone 3. Pain Management: Neurontin 200 mg nightly, oxycodone as needed, Flexeril as needed  8/24- pain controlled- con't regimen  8/25- pt insists to stop gabapentin- will d/c.   8/29- pain controlled except for spasticity Sx's- as below 4. Mood: Provide emotional support             -antipsychotic agents: N/A 5. Neuropsych: This patient is capable of making decisions on her own behalf. 6. Skin/Wound Care: Routine skin checks  8/25- has a scab for "years' on front of scalp- needs Derm after d/c 7. Fluids/Electrolytes/Nutrition: Routine and outs with follow-up chemistries ordered 8.  Symptomatic left ICA stenosis.  Status post left transcarotid artery revascularization 04/06/2021.  Vascular  surgery following 9.  Hypertension.  Norvasc 10 mg daily, HCTZ 12.5 mg daily.  Monitor with increased mobility Vitals:   04/24/21 0332 04/24/21 1307  BP: (!) 158/73 (!) 190/91  Pulse: 78 94  Resp: 18 17  Temp: 97.7 F (36.5 C) 98.2 F (36.8 C)  SpO2: 97% 100%   Right M2 stenosis would avoid low Bps  8/29- BP increased this morning--follow for pattern 10.  Diabetes mellitus.  Hemoglobin A1c 6.5.  SSI.             -monitor CBG's AC/HS CBG (last 3)  Recent Labs    04/24/21 0613 04/24/21 0655 04/24/21 1203  GLUCAP 67* 182* 165*   8/29 - CBG low this am. Otherwise have been improving. Monitor for pattern 11.  Hyperlipidemia.  Lipitor 12.  GERD.  Protonix 13.  CKD stage III.  Baseline creatinine 1.18-1.34.     8/29- Cr down to 1.17 today---continue to monitor 14.  Obesity.  BMI 31.74.  Dietary follow-up 15.  COPD.  Quit smoking 33 years ago.  Check oxygen saturations every shift              -oxygen per [place] at 2L currently  16. Urinary retention with painful caths-continue foley   -voiding trial this week once mobility improves?   -flomax on board  17.  Left toe bruising in sensate foot, pt does not recall an injury , exam unremarkable check xray  8/24- has nondisplaced acute fracture of L 3rd middle phalanx- will tape to 2nd toe to help.  18. Constipation  8/24- will give Sorbitol 29ml at 3pm if no BM by then and follow with Fleet enema if needed  8/29 moved bowels last night 19. Spasticity  8/25- changed Zanaflex to 6pm nightly-  `8/27- attempted Baclofen 5 mg TID_ didn't work- will try Dantrolene 50 mg QHS/6pm to see if can help spasticity- cannot tolerate Zanaflex during day.   8/28- will add baclofen to allergy list- pt insisted to try 25 mg QHS/6pm last night- no side effects- will need to be on at least 3-4 days before increasing to 50 mg QHS.   8/29 tolerated dantrium 25mg  qhs---observe again today   -still with significant tone   LOS: 11 days A FACE TO FACE  EVALUATION WAS PERFORMED  04/24/2021, 1:58 PM

## 2021-04-24 NOTE — Progress Notes (Signed)
Occupational Therapy Session Note  Patient Details  Name: Jade [name] MRN: [id number] Date of Birth: October 27, 1955  Today's Date: 04/24/2021 OT Individual Time: 0802-0917 OT Individual Time Calculation (min): 75 min    Short Term Goals: Week 2:  OT Short Term Goal 1 (Week 2): Pt will maintain dynamic sitting balance with min assist overall during completion of selfcare tasks. OT Short Term Goal 2 (Week 2): Pt will complete UB bathing with min assist for 2 consecutive sessions. OT Short Term Goal 2 - Progress (Week 2): Progressing toward goal OT Short Term Goal 3 (Week 2): Pt will complete LB bathing with max assist sit to stand.  Skilled Therapeutic Interventions/Progress Updates:    Pt worked on ADL during OT session.  Mod assist for supine to sit EOB with use of the [facility] for transfer to the shower bench.  Mod assist for sit to stand, pulling up on the stedy.  Max assist for doffing LB clothing sit to stand with min assist for doffing pullover shirt.  She was able to complete all bathing with min assist for UB and max assist for LB sit to stand.  She continues to need max hand over hand assist for washing the LUE  using the hemi RUE.  Once dried off the Stedy was used for transfer to the wheelchair at the sink to complete dressing tasks.  Max assist for donning pullover shirt with max assist for donning all LB clothing as well following hemi techniques.  She would attempt to donn clothing over the LLE first most of the time.  Total assist for TEDs and shoes that were already tied.  She completed combing her hair and brushing her teeth with setup assist from the wheelchair.  Educated pt on correct placement and donning of the resting hand splint on the RUE to help with managing passive digit positioning.  She will need assist with donning it and will educate family as appropriate.  Call button and phone in reach with safety belt in place and nursing present.   Therapy  Documentation Precautions:  Precautions Precautions: Fall Precaution Comments: RUE hemparesis with increased tone, diplopia Restrictions Weight Bearing Restrictions: No  Pain: Pain Assessment Pain Scale: Faces Faces Pain Scale: No hurt ADL: See Care Tool Section for some details of mobility and selfcare   Therapy/Group: Individual Therapy  Mahamadou Weltz OTR/L 04/24/2021, 9:42 AM

## 2021-04-25 LAB — GLUCOSE, CAPILLARY
Glucose-Capillary: 130 mg/dL — ABNORMAL HIGH (ref 70–99)
Glucose-Capillary: 165 mg/dL — ABNORMAL HIGH (ref 70–99)
Glucose-Capillary: 191 mg/dL — ABNORMAL HIGH (ref 70–99)
Glucose-Capillary: 175 mg/dL — ABNORMAL HIGH (ref 70–99)

## 2021-04-25 NOTE — Progress Notes (Signed)
Occupational Therapy Session Note  Patient Details  Name: Jade [name] MRN: [id number] Date of Birth: [birth year]  Today's Date: 04/25/2021 OT Individual Time: 0802-0906 OT Individual Time Calculation (min): 64 min    Short Term Goals: Week 2:  OT Short Term Goal 1 (Week 2): Pt will maintain dynamic sitting balance with min assist overall during completion of selfcare tasks. OT Short Term Goal 2 (Week 2): Pt will complete UB bathing with min assist for 2 consecutive sessions. OT Short Term Goal 2 - Progress (Week 2): Progressing toward goal OT Short Term Goal 3 (Week 2): Pt will complete LB bathing with max assist sit to stand.  Skilled Therapeutic Interventions/Progress Updates:    Session 1: [phone])  Pt coming out of the bathroom with use of the Stedy with NT to start session.  Finished transfer to the wheelchair from the [place] with mod assist for sit to stand and stand to sit transition.  Worked on donning pants from wheelchair in front of the sink.  Max assist needed for threading them over her LEs and then standing to pull them up over her hips.  Mod assist for static standing at the sink with mod facilitation at the left knee to achieve and maintain extension.  She next transitioned down to the therapy gym via wheelchair with max assist stand pivot transfer to the therapy mat.  Placed hand paddle on the RUE to help with digit positioning in extension.  She was able to work on right wrist and anterior shoulder stretch by completing forward weightshifts while flexing trunk forward with RUE weightbearing on the mat.  Emphasis on upright head and trunk while completion weightshift.  This remains difficult to pt's postural changes and scoliosis over the years.  Also worked on simple weightshifts  to the right with return to midline for increased activation of the RUE.  Still with only trace movements noted.  Finished session with transfer stand pivot back to the wheelchair at max assist with  increased difficulty secondary to transferring to the pushing side.  Returned to the room with handoff to PT for next session.    Session 2: [phone]) Pt was taken down to the therapy gym in the wheelchair to start session.  Had her complete squat pivot transfer to the therapy mat with max assist stand pivot.  Decreased upright posture and right knee stability noted.  Once on the mat complete gentle stretching to the right digit flexors as well as wrist flexors.  NMES placed on the right dorsal forearm to assist with digit extension for 15 mins while working on maintaining upright trunk posture.  Settings listed below with no adverse reactions to stimulation.  Placed RUE in weightbearing over the mat as well while incorporating functional reaching with the LUE as well as weightshifting to the right as well as forward to pick up water placed in front of her.  Finished session with mod assist squat pivot transfer back to the wheelchair on the right side.  Returned to the room with pt remaining sitting up with right resting hand splint in place as well as safety alarm belt.  Call button in reach.   [place] Stim One 330 pulse width 35 Hz pulse rate On 8 sec/ off 8 sec Ramp up/ down 2 sec Symmetrical Biphasic wave form  Max intensity at 500 Ohm load   Therapy Documentation Precautions:  Precautions Precautions: Fall Precaution Comments: RUE hemparesis with increased tone, diplopia Restrictions Weight Bearing Restrictions: No  Pain: Pain Assessment Pain Scale: Faces Faces Pain Scale: Hurts a little bit Pain Type: Acute pain Pain Location: Knee Pain Orientation: Left Pain Descriptors / Indicators: Discomfort Pain Onset: With Activity Pain Intervention(s): Repositioned ADL: See Care Tool Section for some details of mobility and selfcare   Therapy/Group: Individual Therapy  Kendel Bessey OTR/L 04/25/2021, 12:12 PM

## 2021-04-25 NOTE — Progress Notes (Signed)
Physical Therapy Weekly Progress Note  Patient Details  Name: Jade [name] MRN: [id number] Date of Birth: October 16, 1955  Beginning of progress report period: April 15, 2021 End of progress report period: April 25, 2021  Today's Date: 04/25/2021 PT Individual Time: 0905-1003 and 4098-1191 PT Individual Time Calculation (min): 58 min and 40 min  Patient has met 1 of 4 short term goals.  Jade [name] is progressing well with therapy demonstrating increasing independence with functional mobility. She is performing supine<>sit with mod/max assist, sit<>stands using L UE support with mod/max assist, squat pivot transfers with mod/max assist, and participating in high intensity gait training. She is ambulating up to 192f in litegait harness for safety but no true body weight support with mod assist for R LE gait mechanics and ambulating overground up to 872fwith mod assist for balance and R LE gait mechanics with +2 HHA. Demonstrates improved R LE stance control requiring only min assist to facilitate knee extension, but continues to require mod assistance to advance R LE during swing. Jade [name] continue to benefit from CIR level therapies to further advance her independence with functional mobility and decrease her fall risk prior to D/Cing home with 24hr support from family.   Patient continues to demonstrate the following deficits muscle weakness and muscle joint tightness, decreased cardiorespiratoy endurance, abnormal tone and unbalanced muscle activation, decreased visual motor skills, and decreased sitting balance, decreased standing balance, decreased postural control, hemiplegia, and decreased balance strategies and therefore will continue to benefit from skilled PT intervention to increase functional independence with mobility.  Patient progressing toward long term goals..  Continue plan of care.  PT Short Term Goals Week 1:  PT Short Term Goal 1 (Week 1): Pt will perform bed  mobility with mod assist PT Short Term Goal 1 - Progress (Week 1): Progressing toward goal PT Short Term Goal 2 (Week 1): [facility] PT Short Term Goal 2 - Progress (Week 1): Progressing toward goal PT Short Term Goal 3 (Week 1): pt will ambulate 1544fith max A +2 at rail in hall PT Short Term Goal 3 - Progress (Week 1): Met PT Short Term Goal 4 (Week 1): Pt will propell WC [id number] mod assist PT Short Term Goal 4 - Progress (Week 1): Progressing toward goal Week 2:  PT Short Term Goal 1 (Week 2): Pt will perform supine<>sit with mod assist PT Short Term Goal 2 (Week 2): Pt will perform sit<>stands using LRAD with mod assist PT Short Term Goal 3 (Week 2): Pt will perform bed<>chair transfers using LRAD with mod assist PT Short Term Goal 4 (Week 2): Pt will ambulate at least 85ft33fng LRAD with +2 mod assist  Skilled Therapeutic Interventions/Progress Updates:  Ambulation/gait training;Balance/vestibular training;Cognitive remediation/compensation;Community reintegration;Disease management/prevention;DME/adaptive equipment instruction;Discharge planning;Functional mobility training;Neuromuscular re-education;Functional electrical stimulation;Pain management;Patient/family education;Splinting/orthotics;Skin care/wound management;Psychosocial support;Stair training;Therapeutic Activities;Therapeutic Exercise;Visual/perceptual remediation/compensation;UE/LE Coordination activities;UE/LE Strength taining/ROM;Wheelchair propulsion/positioning   Session 1: Pt received sitting in w/c as hand-off from OT session.  Transported to/from gym in w/c for time management and energy conservation. Donned GivMohr sling to R UE for support during gait training. Donned R LE ankle DF assist ACE wrap. Sit>stand w/c>L UE support on litegait with min assist for lifting/balance and min guarding of R knee buckle. Standing with min progressed to mod assist for balance due to gradual increase  in R knee flexion due to inability to sustain R quad activation while +2 assist donned litegait harness.  Gait training 1109f 2x overground with litegait harness for safety (possibly slight body weight support but not intended) with +2 assist to manage litegait - mod assist for R LE gait mechanics and to facilitate R/L weight shift onto stance limb - able to advance R LE 40-50% of the way during swing and requires min assist to facilitate increased R knee extension during stance - cuing throughout for upright posture with primarily need for increased R hip extension during stance. Doffed harness as described above. Transported back to room and left seated in w/c with needs in reach, RUE supported on pillows, and seat belt alarm on.  Session 2: Pt received sitting in w/c and agreeable to therapy session.  Transported to/from gym in w/c for time management and energy conservation. Donned R LE ankle DF assist ACE wrap. Sit>stand w/c>L UE support on hallway rail with mod assist for lifting to stand and minimally blocking R knee - continues to have posterior lean with poor anterior trunk flexion and forward weight shift while transitioning into trunk/hip extension. Gait training 328fat L hallway rail with mod assist for balance and R LE gait mechanics (+2 w/c follow) - able to advance R LE ~30-40% of the way and then requires min assist to guard R knee during stance - cuing for sustained trunk/hip extension for upright posture throughout - facilitating R/L weight shift onto stance limb. Progressed to gait training 8012fsing L HHA from +2 assist and mod assist from therapist as just described to challenge postural stability and promote increased hip muscle activation. Transported back to room in w/c and left seated with needs in reach as OT arriving shortly.   Therapy Documentation Precautions:  Precautions Precautions: Fall Precaution Comments: RUE hemparesis with increased tone,  diplopia Restrictions Weight Bearing Restrictions: No   Pain:  Session 1: Reports L knee "arthritis" pain - OT ACE wrapped it for support just prior to this therapist arrival - provided seated rest breaks for pain management.  Session 2: Continues to report "arthritis" pain in L knee - ACE wrap still donned for support from OT session this AM - planning to discuss with MD regarding ability to apply Voltaren gel to L knee.   Therapy/Group: Individual Therapy  CarTawana ScalePT, DPT, NCS, CSRS 04/25/2021, 7:47 AM

## 2021-04-25 NOTE — Progress Notes (Signed)
Patient's urine has a blood tinge look along with being cloudy and some sediment in the foley. No pain at this time. Will continue to monitor.  Loleta Dicker LPN

## 2021-04-25 NOTE — Progress Notes (Signed)
Pt refused CHG wipes at this time; will complete later today

## 2021-04-25 NOTE — Progress Notes (Signed)
PROGRESS NOTE   Subjective/Complaints:  No side effects so far from Dantrolene-  Had to get foley put back in yesterday- it "fell out".    Slept great on side Asking when can increase dantrolene.    ROS:  Pt denies SOB, abd pain, CP, N/V/C/D, and vision changes   Objective:   No results found. Recent Labs    04/24/21 0623  WBC 10.1  HGB 12.0  HCT 36.4  PLT 387    Recent Labs    04/24/21 0623  NA 137  K 3.8  CL 104  CO2 25  GLUCOSE 143*  BUN 17  CREATININE 1.17*  CALCIUM 9.7     Intake/Output Summary (Last 24 hours) at 04/25/2021 0910 Last data filed at 04/25/2021 0700 Gross per 24 hour  Intake 120 ml  Output 350 ml  Net -230 ml        Physical Exam: Vital Signs Blood pressure (!) 153/74, pulse 70, temperature 98.1 F (36.7 C), resp. rate 17, height 5\' 1"  (1.549 m), weight 76.2 kg, SpO2 99 %.       General: awake, alert, appropriate,  sitting up in w/c; with OT and tech in room; NAD HENT: conjugate gaze; oropharynx moist CV: regular rate; no JVD Pulmonary: CTA B/L; no W/R/R- good air movement GI: soft, NT, ND, (+)BS Psychiatric: appropriate Neurological: verbose; alert MAS of 1+ in RLE and MAS of 2 in RUE.   Skin: bleeding (stopped bleeding, but scab dried blood) scab on front part of scalp Neurologic: Cranial nerves II through XII intact, motor strength is 5/5 in left deltoid, bicep, tricep, grip, hip flexor, knee extensors, ankle dorsiflexor and plantar flexor RUE/RLE hypertonicity. 2-3/4 Sensory exam normal sensation to light touch and proprioception in bilateral upper and lower extremities Musculoskeletal: Full range of motion in all 4 extremities. No joint swelling, no pain to palp Left great toe no pain with ROM,    Assessment/Plan: 1. Functional deficits which require 3+ hours per day of interdisciplinary therapy in a comprehensive inpatient rehab setting. Physiatrist is  providing close team supervision and 24 hour management of active medical problems listed below. Physiatrist and rehab team continue to assess barriers to discharge/monitor patient progress toward functional and medical goals  Care Tool:  Bathing    Body parts bathed by patient: Chest, Abdomen, Right arm, Right upper leg, Left upper leg, Face, Left lower leg   Body parts bathed by helper: Right lower leg, Buttocks, Front perineal area, Left arm     Bathing assist Assist Level: Total Assistance - Patient < 25%     Upper Body Dressing/Undressing Upper body dressing   What is the patient wearing?: Pull over shirt    Upper body assist Assist Level: Maximal Assistance - Patient 25 - 49%    Lower Body Dressing/Undressing Lower body dressing      What is the patient wearing?: Pants, Incontinence brief     Lower body assist Assist for lower body dressing: Total Assistance - Patient < 25%     Toileting Toileting    Toileting assist Assist for toileting: Dependent - Patient 0% (1 assist using Stedy)     Transfers Chair/bed transfer  Transfers assist     Chair/bed transfer assist level: Moderate Assistance - Patient 50 - 74%     Locomotion Ambulation   Ambulation assist   Ambulation activity did not occur: Safety/medical concerns  Assist level: Total Assistance - Patient < 25% Assistive device: Lite Gait Max distance: 173ft   Walk 10 feet activity   Assist  Walk 10 feet activity did not occur: Safety/medical concerns        Walk 50 feet activity   Assist Walk 50 feet with 2 turns activity did not occur: Safety/medical concerns         Walk 150 feet activity   Assist Walk 150 feet activity did not occur: Safety/medical concerns         Walk 10 feet on uneven surface  activity   Assist Walk 10 feet on uneven surfaces activity did not occur: Safety/medical concerns         Wheelchair     Assist Is the patient using a wheelchair?:  Yes      Wheelchair assist level: Dependent - Patient 0% Max wheelchair distance: 50    Wheelchair 50 feet with 2 turns activity    Assist        Assist Level: Dependent - Patient 0%   Wheelchair 150 feet activity     Assist      Assist Level: Dependent - Patient 0%   Blood pressure (!) 153/74, pulse 70, temperature 98.1 F (36.7 C), resp. rate 17, height 5\' 1"  (1.549 m), weight 76.2 kg, SpO2 99 %.  Medical Problem List and Plan: 1.  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  Antithrombotics: -DVT/anticoagulation: Subcutaneous heparin   8/25- changed to Lovenox 40 mg qday since CrCl is >40             -antiplatelet therapy: Aspirin 325 mg daily and Plavix 75 mg daily x3 months then Plavix alone 3. Pain Management: Neurontin 200 mg nightly, oxycodone as needed, Flexeril as needed  8/24- pain controlled- con't regimen  8/25- pt insists to stop gabapentin- will d/c.   8/29- pain controlled except for spasticity Sx's- as below 4. Mood: Provide emotional support             -antipsychotic agents: N/A 5. Neuropsych: This patient is capable of making decisions on her own behalf. 6. Skin/Wound Care: Routine skin checks  8/30-  has a scab for "years' on front of scalp- needs Derm after d/c- still there- concerning to me.  7. Fluids/Electrolytes/Nutrition: Routine and outs with follow-up chemistries ordered 8.  Symptomatic left ICA stenosis.  Status post left transcarotid artery revascularization 04/06/2021.  Vascular surgery following 9.  Hypertension.  Norvasc  10 mg daily, HCTZ 12.5 mg daily.  Monitor with increased mobility Vitals:   04/24/21 1933 04/25/21 0533  BP: (!) 147/83 (!) 153/74  Pulse: 90  70  Resp: 18 17  Temp: 98.2 F (36.8 C) 98.1 F (36.7 C)  SpO2: 97% 99%   Right M2 stenosis would avoid low Bps  8/30- BP slightly elevated, but told to not go low? Will maintain BP meds for now 147/83 10.  Diabetes mellitus.  Hemoglobin A1c 6.5.  SSI.             -monitor CBG's AC/HS CBG (last 3)  Recent Labs    04/24/21 1634 04/24/21 2117 04/25/21 0534  GLUCAP 163* 178* 130*   8/30- BG's a little elevated 160s to 170s- con't and monitor for trend 11.  Hyperlipidemia.  Lipitor 12.  GERD.  Protonix 13.  CKD stage III.  Baseline creatinine 1.18-1.34.     8/29- Cr down to 1.17 today---continue to monitor 14.  Obesity.  BMI 31.74.  Dietary follow-up 15.  COPD.  Quit smoking 33 years ago.  Check oxygen saturations every shift              -oxygen per [place] at 2L currently  16. Urinary retention with painful caths-continue foley   -voiding trial this week once mobility improves?   -flomax on board  8/30- wants foley- had it put back in last night- will d/w pt further tomorrow 17.  Left toe bruising in sensate foot, pt does not recall an injury , exam unremarkable check xray  8/24- has nondisplaced acute fracture of L 3rd middle phalanx- will tape to 2nd toe to help.  18. Constipation  8/24- will give Sorbitol 43ml at 3pm if no BM by then and follow with Fleet enema if needed  8/29 moved bowels last night 19. Spasticity  8/25- changed Zanaflex to 6pm nightly-  `8/27- attempted Baclofen 5 mg TID_ didn't work- will try Dantrolene 50 mg QHS/6pm to see if can help spasticity- cannot tolerate Zanaflex during day.   8/28- will add baclofen to allergy list- pt insisted to try 25 mg QHS/6pm last night- no side effects- will need to be on at least 3-4 days before increasing to 50 mg QHS.   8/30- will increase Dantrolene tomorrow    LOS: 12 days A  FACE TO FACE EVALUATION WAS PERFORMED  Butch Otterson 04/25/2021, 9:10 AM

## 2021-04-26 DIAGNOSIS — E1165 Type 2 diabetes mellitus with hyperglycemia: Secondary | ICD-10-CM

## 2021-04-26 DIAGNOSIS — G8110 Spastic hemiplegia affecting unspecified side: Secondary | ICD-10-CM

## 2021-04-26 DIAGNOSIS — N1831 Chronic kidney disease, stage 3a: Secondary | ICD-10-CM

## 2021-04-26 LAB — GLUCOSE, CAPILLARY
Glucose-Capillary: 173 mg/dL — ABNORMAL HIGH (ref 70–99)
Glucose-Capillary: 183 mg/dL — ABNORMAL HIGH (ref 70–99)
Glucose-Capillary: 164 mg/dL — ABNORMAL HIGH (ref 70–99)
Glucose-Capillary: 164 mg/dL — ABNORMAL HIGH (ref 70–99)

## 2021-04-26 MED ORDER — DANTROLENE SODIUM 25 MG PO CAPS
50.0000 mg | ORAL_CAPSULE | Freq: Every day | ORAL | Status: DC
  Administered 2021-04-26 – 2021-05-11 (×16): 50 mg via ORAL
  Filled 2021-04-26 (×16): qty 2

## 2021-04-26 NOTE — Progress Notes (Signed)
Patient ID: Jade [name], female   DOB: 04/16/1956, 65 y.o.   MRN: [id number] Team Conference Report to Patient/Family  Team Conference discussion was reviewed with the patient and caregiver, including goals, any changes in plan of care and target discharge date.  Patient and caregiver express understanding and are in agreement.  The patient has a target discharge date of 05/12/21.  SW met with pt, pt request not to call family. Reports she will communicate with family due to spouse speaking limited [place]. Pt has foley. Will require assistance with hand splints. Met all speech goals. Pt requesting DME list, sw will follow up with pt closer to d/c date. No additional questions or concerns.   Dyanne Iha 04/26/2021, 1:35 PM

## 2021-04-26 NOTE — Patient Care Conference (Signed)
Inpatient RehabilitationTeam Conference and Plan of Care Update Date: 04/26/2021   Time: 11:40 AM    Patient Name: Jade [name]      Medical Record Number: [id number]  Date of Birth: 1956/08/10 Sex: Female         Room/Bed: 4W24C/4W24C-01 Payor Info: Payor: [employer]vertising copywriter MEDICARE / Plan: [employer] MEDICARE / Product Type: *No Product type* /    Admit Date/Time:  04/13/2021  4:00 PM  Primary Diagnosis:  CVA (cerebral vascular accident) [facility])  Hospital Problems: Principal Problem:   CVA (cerebral vascular accident) (HCC) Active Problems:   Spastic hemiparesis (HCC)   Stage 3a chronic kidney disease (HCC)   Controlled type 2 diabetes mellitus with hyperglycemia, without long-term current use of insulin [facility])    Expected Discharge Date: Expected Discharge Date: 05/12/21  Team Members Present: Physician leading conference: Dr. Maryla Morrow Social Worker Present: Lavera Guise, BSW Nurse Present: Chana Bode, RN PT Present: Casimiro Needle, PT OT Present: Perrin Maltese, OT SLP Present: Eilene Ghazi, SLP PPS Coordinator present : Fae Pippin, SLP     Current Status/Progress Goal Weekly Team Focus  Bowel/Bladder   Pt is cont incont of B/B. LBM 04/25/21. indwelling foley currently in place  Pt to gain cont.  Toilet pt as needed   Swallow/Nutrition/ Hydration             ADL's   Min assist for UB bathing with mod assist for UB dressing.  Max assist for LB bathing and dressing sit to stand.  Increased pushing to the right in standing and with transitional movements.  Brunnstrum stage II in the right hand with increased digit flexor tone with stage II-III in the right arm.  Max assist for squat pivot transfers as well  min assist overall  selfcare retraining, transfer training, neuromuscular re-education, balance retraining, therapeutic activites, pt/family educaiton   Mobility   supine<>sit mod/max assist, sit<>stand using L handrail support mod assist, mod/max assist squat  pivot transfers, overground high intensity gait training using litegait for safety but no body weight support up to 128ft with mod assist for R LE management vs 29ft overground with +2 HHA and mod assist for balance and R LE gait mechanics - able to advance R LE ~30-50% during swing and requires min assist to guard/block R knee during stance  min assist overall  transfer training, midline orientation, R LE NMR, standing balance, high intensity gait training, pt education, activity tolerance, trunk control   Communication             Safety/Cognition/ Behavioral Observations            Pain   Pt denies pain  Pt remain pain free  Assess qshift for pain and provide PRN when needed   Skin   Pt skin is free of breakdown but has scattered brusing  Pt skin stay free of breakdown and injury  Assess skin qshift and provide skin care     Discharge Planning:  Pt discharging home with spouse and son. Spouse works during the day, unable to provide 24/7. Son unemployed able to provide 24/7   Team Discussion: Monitoring BP, DM stable and foley for urinary retention. Note increased tone in fingers and glexors; meds adjusted per MD however no change noted. Also having difficulty with don/doff resting hand splint and once placed, wearing during the day at times due to increased tone.  Patient on target to meet rehab goals: yes, currently needs min assist for upper body care and mod -  max assist for lower body bathing and dressing. Right side weakness and pushing to the right improved. Requires mod assist for sit - stand and  mod - max assist for squat pivot transfers. Needs mod assist for lower extremity advancement. Requires hand over hand  max assist for right UE use. Goals for discharge home with son are min assist overall.  *See Care Plan and progress notes for long and short-term goals.   Revisions to Treatment Plan:  Gait training in the light gait and able to go 160'.    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, new b/l PCA small infarcts etiology likely intracranial stenosis in the setting of fluctuating BP  Barriers to Discharge: Medical stability;Weight   Possible Resolutions to [employer] Focus: Therapies, optimize BP meds, follow labs - Cr, optimize bowel meds, spasticty meds   Continued Need for Acute Rehabilitation Level of Care: The patient requires daily medical management by a physician with specialized training in physical medicine and rehabilitation for the following reasons: Direction of a multidisciplinary physical rehabilitation program to maximize functional independence : Yes Medical management of patient stability for increased activity during participation in an intensive rehabilitation regime.: Yes Analysis of laboratory values and/or radiology reports with any subsequent need for medication adjustment and/or medical intervention. : Yes   I attest that I was present, lead the team conference, and concur with the assessment and plan of the team.   Chana Bode B 04/26/2021, 2:12 PM

## 2021-04-26 NOTE — Progress Notes (Signed)
Occupational Therapy Session Note  Patient Details  Name: Jade [name] MRN: [id number] Date of Birth: 1955-10-15  Today's Date: 04/26/2021 OT Individual Time: 4235-3614 OT Individual Time Calculation (min): 60 min    Short Term Goals: Week 2:  OT Short Term Goal 1 (Week 2): Pt will maintain dynamic sitting balance with min assist overall during completion of selfcare tasks. OT Short Term Goal 2 (Week 2): Pt will complete UB bathing with min assist for 2 consecutive sessions. OT Short Term Goal 2 - Progress (Week 2): Progressing toward goal OT Short Term Goal 3 (Week 2): Pt will complete LB bathing with max assist sit to stand.  Skilled Therapeutic Interventions/Progress Updates:    Pt completed UB bathing and dressing at the sink.  Supervision for UB bathing with max assist for UB dressing.  Sit to stand was completed at the sink as well with max assist.  Increased equal weightbearing noted in standing with less pushing to the right.  Mod assist for sustaining right knee extension in standing.  Increased flexor tone noted in the PIPs and DIPs of the right hand.  Therapist applied NMES to the dorsal forearm for 12 mins on level 14 for inhibition of flexors.  She did not report any pain with no redness noted either post conclusion.  While stimulation was active, therapist assisted with PROM stimulation was active.  Finished session with pt in the wheelchair and resting hand splint in place.  See below for parameters for stimulation. Sa[place]o Stim One 330 pulse width 35 Hz pulse rate On 8 sec/ off 8 sec Ramp up/ down 2 sec Symmetrical Biphasic wave form  Max intensity at 500 Ohm load   Therapy Documentation Precautions:  Precautions Precautions: Fall Precaution Comments: RUE hemparesis with increased tone, diplopia Restrictions Weight Bearing Restrictions: No  Pain: Pain Assessment Pain Scale: Faces Pain Score: 0-No pain Faces Pain Scale: Hurts a little bit Pain Type:  Acute pain Pain Location: Knee Pain Orientation: Left Pain Descriptors / Indicators: Discomfort Pain Onset: With Activity Pain Intervention(s): Repositioned;Emotional support ADL: See Care Tool Section for some details of mobility and selfcare   Therapy/Group: Individual Therapy  Dannell Raczkowski OTR/L 04/26/2021, 12:23 PM

## 2021-04-26 NOTE — Progress Notes (Signed)
Physical Therapy Session Note  Patient Details  Name: Jade [name] MRN: [id number] Date of Birth: 28-Jan-1956  Today's Date: 04/26/2021 PT Individual Time: [phone] and 1406-1506 PT Individual Time Calculation (min): 91 min and 60 min  Short Term Goals: Week 2:  PT Short Term Goal 1 (Week 2): Pt will perform supine<>sit with mod assist PT Short Term Goal 2 (Week 2): Pt will perform sit<>stands using LRAD with mod assist PT Short Term Goal 3 (Week 2): Pt will perform bed<>chair transfers using LRAD with mod assist PT Short Term Goal 4 (Week 2): Pt will ambulate at least 34ft using LRAD with +2 mod assist  Skilled Therapeutic Interventions/Progress Updates:    Session 1: Pt received supine in bed and agreeable to therapy session. Supine>sitting L EOB, HOB flat and not using bedrail, with heavy mod assist for R hemibody management and trunk upright - cuing for sequencing logroll technique to increase pt independence - difficulty with bringing trunk forward while coming to sit. Sitting EOB donned B LE TED hose, threaded pants, and donned tennis shoes total assist for time management. Sit>stand EOB>L UE support on bedrail with heavy mod assist of 1 for lifting to stand and balance while standing and pulling up pants total assist. R squat pivot EOB>w/c with heavy mod assist for lifting/pivoting hips - cuing/facilitation for increased and sustained anterior trunk lean. Sitting in w/c performed oral care with hand-over-hand assistance to integrate R UE into the task - assist provided as needed.  Transported to/from gym in w/c for time management and energy conservation.  Donned R LE ankle DF assist ACE wrap. Donned ACE wrap to L knee for compression, proprioceptive input, and pain management. Donned GivMohr sling on R UE for support during gait training. Sit>stand w/c>L UE support on litegait with light min assist to come to stand progressed to heavy mod assist to maintain standing due to R lean and  gradually worsening R knee flexion due to inability to sustain quad contraction while +2 assist donned litegait harness.  Overground gait training 163ft 3x (seated break between each) in litegait for safety but no true body weight support - requires +2 assist to manage litegait - mod assist for R LE management and facilitating R/L weight shifting onto stance limb - able to advance R LE ~40-50% during swing phase (unable to achieve reciprocal pattern without manual facilitation) - requires min assist for R hip/knee extension during stance phase and manual facilitation to avoid excessive hip external rotation - cuing throughout to maintain trunk/hip extension throughout gait.  Doffed RUE sling and both ACE wraps. Transported back to room and left seated in w/c with needs in reach, seat belt alarm on, and R UE therapeutically positioned on a pillow.     Session 2: Pt received sitting in w/c and agreeable to therapy session.  Transported to/from gym in w/c for time management and energy conservation.   Donned R LE ankle DF assist ACE wrap. Donned L knee ACE wrap for compression, proprioceptive input, and pain management. Sit>stand w/c>L HHA with heavy mod assist for coming to stand due to posterior lean from lack of anterior trunk lean and weight shift while rising as well as delayed hip/knee extension.  Gait training 28ft 2x L HHA with +2 mod assist for balance and R LE gait mechanics - heavy facilitation for L weight shift onto stance limb (to allow RLE swing advancement) - mod/max assist to advance R LE during swing and mod assist to stabilize R knee  during stance phase as well as manually facilitating improved positioning to avoid excessive hip external rotation with adduction - cuing throughout for increased trunk/hip extension to maintain upright posture as well as facilitate forward propulsion.  Squat pivot w/c<>Nustep with mod assist for lifting/pivoting hips and facilitating R LE positioning and  weightbearing, cuing for anterior trunk lean (+2 assist in room for safety but not needed). B LE reciprocal movement patterns on Nustep against level 3 increased to level 6 resistance for 5 minutes totaling 135steps - repeated cuing throughout to avoid excessive R hip external rotation to improve knee alignment. Transported back to room in w/c at end of session with pt reporting she feels like she has an "antiseptic smell" near her catheter that she smelled when it was dislodged the other day - nursing staff notified. Pt left seated in recliner with needs in reach, R UE therapeutically positioned on pillows, and seat belt alarm on.  Therapy Documentation Precautions:  Precautions Precautions: Fall Precaution Comments: RUE hemparesis with increased tone, diplopia Restrictions Weight Bearing Restrictions: No   Pain:  Session 1: Reports experienced significant discomfort last night after wearing ACE wrap on L knee all day - educated pt to have nursing staff doff it if it becomes uncomfortable - doffed the ACE wrap applied today immediately at end of session. Continues to have chronic L knee pain - donned ACE wrap just during gait training - notified MD of pt's request for voltaren gel to L knee. Applied GivMohr sling to R UE for improved positioning and support during gait training due to pain.    Session 2: Continues to have chronic L knee pain - ACE wrap donned just during session.   Therapy/Group: Individual Therapy  Ginny Forth , PT, DPT, NCS, CSRS 04/26/2021, 7:51 AM

## 2021-04-26 NOTE — Progress Notes (Signed)
PROGRESS NOTE   Subjective/Complaints: Patient seen sitting up in her chair working with therapy this morning and later ambulating.  She states she slept well overnight.  Discussed the left knee pain with patient and therapies.  ROS: + Left knee pain. Denies CP, SOB, N/V/D  Objective:   No results found. Recent Labs    04/24/21 0623  WBC 10.1  HGB 12.0  HCT 36.4  PLT 387     Recent Labs    04/24/21 0623  NA 137  K 3.8  CL 104  CO2 25  GLUCOSE 143*  BUN 17  CREATININE 1.17*  CALCIUM 9.7      Intake/Output Summary (Last 24 hours) at 04/26/2021 1138 Last data filed at 04/26/2021 0815 Gross per 24 hour  Intake 280 ml  Output 1175 ml  Net -895 ml         Physical Exam: Vital Signs Blood pressure (!) 148/69, pulse 82, temperature 98.1 F (36.7 C), temperature source Oral, resp. rate 16, height 5\' 1"  (1.549 m), weight 76.2 kg, SpO2 97 %. Constitutional: No distress . Vital signs reviewed. HENT: Normocephalic.  Atraumatic. Eyes: EOMI. No discharge. Cardiovascular: No JVD.  RRR. Respiratory: Normal effort.  No stridor.  Bilateral clear to auscultation. GI: Non-distended.  BS +. Skin: Warm and dry.  Intact. Psych: Normal mood.  Normal behavior. Musc: No edema in extremities.  No tenderness in extremities. Neuro: Alert Motor: LUE/LLE: 5/5 proximal and distal LUE: Elbow flexion/extension 2/5 Left lower extremity: Ankle dorsiflexion 1/5 mAS: Right hand 3/4  Assessment/Plan: 1. Functional deficits which require 3+ hours per day of interdisciplinary therapy in a comprehensive inpatient rehab setting. Physiatrist is providing close team supervision and 24 hour management of active medical problems listed below. Physiatrist and rehab team continue to assess barriers to discharge/monitor patient progress toward functional and medical goals  Care Tool:  Bathing    Body parts bathed by patient: Chest,  Abdomen, Right arm, Right upper leg, Left upper leg, Face, Left lower leg   Body parts bathed by helper: Right lower leg, Buttocks, Front perineal area, Left arm     Bathing assist Assist Level: Total Assistance - Patient < 25%     Upper Body Dressing/Undressing Upper body dressing   What is the patient wearing?: Pull over shirt    Upper body assist Assist Level: Maximal Assistance - Patient 25 - 49%    Lower Body Dressing/Undressing Lower body dressing      What is the patient wearing?: Pants, Incontinence brief     Lower body assist Assist for lower body dressing: Total Assistance - Patient < 25%     Toileting Toileting    Toileting assist Assist for toileting: Dependent - Patient 0% (1 assist using Stedy)     Transfers Chair/bed transfer  Transfers assist     Chair/bed transfer assist level: Moderate Assistance - Patient 50 - 74%     Locomotion Ambulation   Ambulation assist   Ambulation activity did not occur: Safety/medical concerns  Assist level: 2 helpers (+2 mod A) Assistive device: Hand held assist Max distance: 49ft   Walk 10 feet activity   Assist  Walk 10 feet activity  did not occur: Safety/medical concerns  Assist level: 2 helpers (+2 mod A) Assistive device: Hand held assist   Walk 50 feet activity   Assist Walk 50 feet with 2 turns activity did not occur: Safety/medical concerns         Walk 150 feet activity   Assist Walk 150 feet activity did not occur: Safety/medical concerns         Walk 10 feet on uneven surface  activity   Assist Walk 10 feet on uneven surfaces activity did not occur: Safety/medical concerns         Wheelchair     Assist Is the patient using a wheelchair?: Yes      Wheelchair assist level: Dependent - Patient 0% Max wheelchair distance: 50    Wheelchair 50 feet with 2 turns activity    Assist        Assist Level: Dependent - Patient 0%   Wheelchair 150 feet activity      Assist      Assist Level: Dependent - Patient 0%   Blood pressure (!) 148/69, pulse 82, temperature 98.1 F (36.7 C), temperature source Oral, resp. rate 16, height 5\' 1"  (1.549 m), weight 76.2 kg, SpO2 97 %.  Medical Problem List and Plan: 1.  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  Antithrombotics: -DVT/anticoagulation: Subcutaneous heparin   8/25- changed to Lovenox 40 mg qday since CrCl is >40             -antiplatelet therapy: Aspirin 325 mg daily and Plavix 75 mg daily x3 months then Plavix alone 3. Pain Management: Neurontin 200 mg nightly, oxycodone as needed, Flexeril as needed  Voltaren gel ordered to left knee on 8/31 4. Mood: Provide emotional support             -antipsychotic agents: N/A 5. Neuropsych: This patient is capable of making decisions on her own behalf. 6. Skin/Wound Care: Routine skin checks  8/30-  has a scab for "years' on front of scalp- needs Derm after d/c  7. Fluids/Electrolytes/Nutrition: Routine and outs with follow-up chemistries ordered 8.  Symptomatic left ICA stenosis.  Status post left transcarotid artery revascularization 04/06/2021.  Vascular surgery following 9.  Hypertension.  Norvasc 10 mg daily, HCTZ 12.5 mg daily.  Monitor with increased mobility Vitals:   04/25/21 2115 04/26/21 0534  BP: 119/75 (!) 148/69  Pulse: 97 82  Resp: 18 16  Temp: 98 F (36.7 C) 98.1 F (36.7 C)  SpO2: 99% 97%   Slightly labile on 8/31, monitor for now, avoid hypotension 10.  Diabetes mellitus with hyperglycemia.  Hemoglobin A1c 6.5.  SSI.             -monitor CBG's AC/HS CBG (last 3)  Recent Labs    04/25/21 1651 04/25/21 2117 04/26/21 0603  GLUCAP 191*  175* 173*    Has been labile over the last couple days, consider initiation of medications if persistently elevated 11.  Hyperlipidemia.  Lipitor 12.  GERD.  Protonix 13.  CKD stage III.  Baseline creatinine 1.18-1.34.     Creatinine 1.17 on 8/29 14.  Obesity.  BMI 31.74.  Dietary follow-up  15.  COPD.  Quit smoking 33 years ago.  Check oxygen saturations every shift              -oxygen per [place] at 2L currently  16. Urinary retention with painful caths-continue foley   -voiding trial this week once mobility improves?   -flomax on board  Patient wants Foley 17.  Left toe bruising in sensate foot, pt does not recall an injury , exam unremarkable check xray  8/24- has nondisplaced acute fracture of L 3rd middle phalanx- will tape to 2nd toe to help.  18. Constipation  Improving 19. Spasticity  8/25- changed Zanaflex to 6pm nightly-  ` Dantrolene increased to 50 mg QHS/6pm to see if can help spasticity- cannot tolerate Zanaflex during day.   Baclofen 25 mg QHS/6pm last night- no side effects- will need to be on at least 3-4 days before increasing to 50 mg QHS.   LOS: 13 days A FACE TO FACE EVALUATION WAS PERFORMED  Jade [name] Jade [name] 04/26/2021, 11:38 AM

## 2021-04-27 LAB — GLUCOSE, CAPILLARY
Glucose-Capillary: 158 mg/dL — ABNORMAL HIGH (ref 70–99)
Glucose-Capillary: 157 mg/dL — ABNORMAL HIGH (ref 70–99)
Glucose-Capillary: 196 mg/dL — ABNORMAL HIGH (ref 70–99)
Glucose-Capillary: 200 mg/dL — ABNORMAL HIGH (ref 70–99)

## 2021-04-27 MED ORDER — ALUM & MAG HYDROXIDE-SIMETH 200-200-20 MG/5ML PO SUSP: 30.0000 mL | ORAL | Status: DC | PRN

## 2021-04-27 MED ORDER — ACETAMINOPHEN 325 MG PO TABS
650.0000 mg | ORAL_TABLET | ORAL | Status: DC | PRN
  Administered 2021-04-27 – 2021-05-09 (×6): 650 mg via ORAL

## 2021-04-27 MED ORDER — MELATONIN 5 MG PO TABS: 5.0000 mg | ORAL_TABLET | Freq: Every evening | ORAL | Status: DC | PRN

## 2021-04-27 MED ORDER — SIMETHICONE 80 MG PO CHEW
80.0000 mg | CHEWABLE_TABLET | Freq: Four times a day (QID) | ORAL | Status: DC | PRN
  Filled 2021-04-27: qty 1

## 2021-04-27 MED ORDER — PROCHLORPERAZINE 25 MG RE SUPP: 25.0000 mg | Freq: Three times a day (TID) | RECTAL | Status: DC | PRN

## 2021-04-27 MED ORDER — PROCHLORPERAZINE EDISYLATE 10 MG/2ML IJ SOLN: 10.0000 mg | Freq: Four times a day (QID) | INTRAMUSCULAR | Status: DC | PRN

## 2021-04-27 MED ORDER — PROCHLORPERAZINE MALEATE 5 MG PO TABS
5.0000 mg | ORAL_TABLET | Freq: Four times a day (QID) | ORAL | Status: DC | PRN
  Administered 2021-04-27: 5 mg via ORAL
  Filled 2021-04-27: qty 1

## 2021-04-27 NOTE — Evaluation (Signed)
Recreational Therapy Assessment and Plan  Patient Details  Name: Jade [name] MRN: [id number] Date of Birth: 08-08-1956 Today's Date: 04/27/2021  Rehab Potential:  Good ELOS:   9/16  Assessment  Hospital Problem: Principal Problem:   CVA (cerebral vascular accident) [facility])     Past Medical History:      Past Medical History:  Diagnosis Date   Arthritis      "back, knees, some in my left hip" (03/21/2018)   Chronic kidney disease      "was told I had 25% kidney function in early 2012" (03/21/2018)   COPD (chronic obstructive pulmonary disease) ([place])     CVA (cerebral vascular accident) ([place]) 03/21/2018    "numb all over; head to toe" (03/21/2018)   Fibromyalgia     History of gout     Hyperlipidemia     Hypertension     Migraine      "used to get them monthly during menopause" (03/21/2018)   Neuromuscular disorder ([place])     OSA (obstructive sleep apnea)      "don't tolerate the machine" (03/21/2018)   Pneumonia ~ 2007   Type 2 diabetes, diet controlled ([place])      Past Surgical History:       Past Surgical History:  Procedure Laterality Date   HERNIA REPAIR       LAPAROSCOPIC CHOLECYSTECTOMY   06/2007    "no UHR w/this" (03/21/2018)   TONSILLECTOMY       TRANSCAROTID ARTERY REVASCULARIZATION  Left 04/06/2021    Procedure: LEFT TRANSCAROTID ARTERY REVASCULARIZATION;  Surgeon: Cherre Robins, MD;  Location: [facility] OR;  Service: Vascular;  Laterality: Left;   ULTRASOUND GUIDANCE FOR VASCULAR ACCESS Right 04/06/2021    Procedure: ULTRASOUND GUIDANCE FOR VASCULAR ACCESS;  Surgeon: Cherre Robins, MD;  Location: [facility] OR;  Service: Vascular;  Laterality: Right;   UMBILICAL HERNIA REPAIR   2009      Assessment & Plan Clinical Impression: Patient is a 65 year old right-handed female with history significant for CVA, COPD, CKD stage III hypertension, hypertension, OSA, diabetes mellitus.  Recent admission 03/05/2021 to 03/14/2021 for CVA/left inferior thalamic maintained on aspirin  and Plavix maintained on aspirin and Plavix.  Per chart review patient lives with spouse.  Modified independent with rolling walker.  1 level home 4 steps to entry.  Presented 04/01/2021 with right side weakness.  CT/MRI showed small acute infarcts in the left frontal parietal junction.  Additional small focus of acute ischemia adjacent to the frontal horn of the left lateral ventricle with petechial hemorrhage.  Multiple old small vessel infarct and findings of chronic ischemic microangiopathy.  CT angiogram of head and neck severe left ICA origin stenosis due to calcified plaque, over 80%.  40% narrowing at the right ICA origin.  High-grade left M3 branch stenosis which could be related to atherosclerosis or recent embolic disease.  Admission chemistries unremarkable except potassium 3.3, creatinine 0.96, urine drug screen negative.  Echocardiogram of 03/06/2021 showed ejection fraction of 55 to 00% grade 1 diastolic dysfunction.  Vascular surgery follow-up in regards to severe left carotid artery stenosis underwent left transcarotid artery revascularization 04/06/2021 per Dr. Stanford Breed.   Patient transferred to CIR on 04/13/2021 .    Pt presents with decreased activity tolerance, decreased functional mobility, decreased balance, decreased coordination, decreased vision Limiting pt's independence with leisure/community pursuits.Met with pt to discuss leisure interests, activity analysis/modifications and stress management.    Plan   Min 1 TR session >20 minutes per week during  LOS Recommendations for other services: None   Discharge Criteria: Patient will be discharged from TR if patient refuses treatment 3 consecutive times without medical reason.  If treatment goals not met, if there is a change in medical status, if patient makes no progress towards goals or if patient is discharged from hospital.  The above assessment, treatment plan, treatment alternatives and goals were discussed and mutually agreed upon:  by patient  [place] 04/27/2021, 4:25 PM

## 2021-04-27 NOTE — Progress Notes (Signed)
Physical Therapy Session Note  Patient Details  Name: CHARLESE GRUETZMACHER MRN: [id number] Date of Birth: 06-29-1956  Today's Date: 04/27/2021 PT Missed Time: 75 Minutes Missed Time Reason: Pain  Pt received supine in bed with lights off and eye cover on but easily awakens. Pt states that she had a terrible night and is having severe pain in R UE stating "just moving it a fraction of an inch is excruciating." Pt reports that her call button somehow got disconnected from the wall last night and she was not able to call for help so she did not receive pain medication until around midnight. Pt very apologetic and disappointed to state she is unable to participate in therapy this morning. Therapist provided emotional support and encouragement on her progress thus far. MD in/out for morning assessment and made aware of the situation. Therapist also notified charge nurse, Sherron Monday. Pt remained supine in bed with needs in reach, R UE supported on pillow, and bed alarm on. Missed 75 minutes of skilled physical therapy.   Ginny Forth , PT, DPT, NCS, CSRS 04/27/2021, 7:48 AM
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
## 2021-04-27 NOTE — Progress Notes (Signed)
PROGRESS NOTE   Subjective/Complaints:  Pt reports RUE hurts so bad, she cannot do any therapy today- she notes her call bell was accidentally pulled out of wall, so didn't get pain meds for a long time-til midnight.  Also TEDs were left on.   LBM yesterday but says they "aren't normal"- finally figured out she meant she's having incontinence with BM's- cannot control them.   Also doesn't want to remove foley since having incontinence- and even did PRIOR to stroke- upset about this.   Asking/if can increase Dantrolene- did yesterday.   ROS:  Pt denies SOB, abd pain, CP, N/V/C/D, and vision changes   Objective:   No results found. No results for input(s): WBC, HGB, HCT, PLT in the last 72 hours.  No results for input(s): NA, K, CL, CO2, GLUCOSE, BUN, CREATININE, CALCIUM in the last 72 hours.   Intake/Output Summary (Last 24 hours) at 04/27/2021 1112 Last data filed at 04/27/2021 0610 Gross per 24 hour  Intake 500 ml  Output 1000 ml  Net -500 ml        Physical Exam: Vital Signs Blood pressure 134/68, pulse 77, temperature 97.8 F (36.6 C), temperature source Oral, resp. rate 18, height 5\' 1"  (1.549 m), weight 76.2 kg, SpO2 99 %.   General: awake, alert, very unhappy; laying supine in bed; PT in room;  NAD HENT: conjugate gaze; oropharynx moist CV: regular rate; no JVD Pulmonary: CTA B/L; no W/R/R- good air movement GI: soft, NT, ND, (+)BS Psychiatric: appropriate, but upset about night's events Neurological: alert Has MAS of 3 in RUE_ but won't let me fully examine arm due to pain.  Skin: Warm and dry.  Intact. Musc: No edema in extremities.  No tenderness in extremities. Neuro: Alert Motor: LUE/LLE: 5/5 proximal and distal RUE: Elbow flexion/extension 2/5 R lower extremity: Ankle dorsiflexion 1/5 mAS: Right hand 3/4  Assessment/Plan: 1. Functional deficits which require 3+ hours per day of  interdisciplinary therapy in a comprehensive inpatient rehab setting. Physiatrist is providing close team supervision and 24 hour management of active medical problems listed below. Physiatrist and rehab team continue to assess barriers to discharge/monitor patient progress toward functional and medical goals  Care Tool:  Bathing    Body parts bathed by patient: Chest, Abdomen, Right arm, Right upper leg, Left upper leg, Face, Left lower leg   Body parts bathed by helper: Right lower leg, Buttocks, Front perineal area, Left arm     Bathing assist Assist Level: Total Assistance - Patient < 25%     Upper Body Dressing/Undressing Upper body dressing   What is the patient wearing?: Pull over shirt    Upper body assist Assist Level: Maximal Assistance - Patient 25 - 49%    Lower Body Dressing/Undressing Lower body dressing      What is the patient wearing?: Pants, Incontinence brief     Lower body assist Assist for lower body dressing: Total Assistance - Patient < 25%     Toileting Toileting    Toileting assist Assist for toileting: Dependent - Patient 0% (1 assist using Stedy)     Transfers Chair/bed transfer  Transfers assist     Chair/bed transfer  assist level: Moderate Assistance - Patient 50 - 74% (squat pivot)     Locomotion Ambulation   Ambulation assist   Ambulation activity did not occur: Safety/medical concerns  Assist level: 2 helpers (+2 mod A) Assistive device: Hand held assist Max distance: 19ft 2x   Walk 10 feet activity   Assist  Walk 10 feet activity did not occur: Safety/medical concerns  Assist level: 2 helpers (+2 mod A) Assistive device: Hand held assist   Walk 50 feet activity   Assist Walk 50 feet with 2 turns activity did not occur: Safety/medical concerns         Walk 150 feet activity   Assist Walk 150 feet activity did not occur: Safety/medical concerns         Walk 10 feet on uneven surface   activity   Assist Walk 10 feet on uneven surfaces activity did not occur: Safety/medical concerns         Wheelchair     Assist Is the patient using a wheelchair?: Yes      Wheelchair assist level: Dependent - Patient 0% Max wheelchair distance: 50    Wheelchair 50 feet with 2 turns activity    Assist        Assist Level: Dependent - Patient 0%   Wheelchair 150 feet activity     Assist      Assist Level: Dependent - Patient 0%   Blood pressure 134/68, pulse 77, temperature 97.8 F (36.6 C), temperature source Oral, resp. rate 18, height 5\' 1"  (1.549 m), weight 76.2 kg, SpO2 99 %.  Medical Problem List and Plan: 1.  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  Antithrombotics: -DVT/anticoagulation: Subcutaneous heparin   8/25- changed to Lovenox 40 mg qday since CrCl is >40             -antiplatelet therapy: Aspirin 325 mg daily and Plavix 75 mg daily x3 months then Plavix alone 3. Pain Management: Neurontin 200 mg nightly, oxycodone as needed, Flexeril as needed  Voltaren gel ordered to left knee on 8/31  9/1- pt wants to increase dantrolene- was done yesterday.  4. Mood: Provide emotional support             -antipsychotic agents: N/A 5. Neuropsych: This patient is capable of making decisions on her own behalf. 6. Skin/Wound Care: Routine skin checks  8/30-  has a scab for "years' on front of scalp- needs Derm after d/c  7. Fluids/Electrolytes/Nutrition: Routine and outs with follow-up chemistries ordered 8.  Symptomatic left ICA stenosis.  Status post left transcarotid artery  revascularization 04/06/2021.  Vascular surgery following 9.  Hypertension.  Norvasc 10 mg daily, HCTZ 12.5 mg daily.  Monitor with increased mobility Vitals:   04/26/21 2104 04/27/21 0559  BP: 118/85 134/68  Pulse: 93 77  Resp: 18 18  Temp: 98.2 F (36.8 C) 97.8 F (36.6 C)  SpO2: 99% 99%   Slightly labile on 8/31, monitor for now, avoid hypotension  9/1- BP a little on low side 110s/80s today- will monitor- won't  change meds yet- first day 10.  Diabetes mellitus with hyperglycemia.  Hemoglobin A1c 6.5.  SSI.             -monitor CBG's AC/HS CBG (last 3)  Recent Labs    04/26/21 1625 04/26/21 2106 04/27/21 0602  GLUCAP 164* 164* 158*   Has been labile over the last couple days, consider initiation of medications if persistently elevated  9/1- BG's doing better- con't regimen 11.  Hyperlipidemia.  Lipitor 12.  GERD.  Protonix 13.  CKD stage III.  Baseline creatinine 1.18-1.34.     Creatinine 1.17 on 8/29 14.  Obesity.  BMI 31.74.  Dietary follow-up 15.  COPD.  Quit smoking 33 years ago.  Check oxygen saturations every shift              -oxygen per [place] at 2L currently  16. Urinary retention with painful caths-continue foley   -voiding trial this week once mobility improves?   -flomax on board  Patient wants Foley 9/1- pt admitted having urinary incontinence prior to CVA_ and doesn't want foley removed.  17.  Left toe bruising in sensate foot, pt does not recall an injury , exam unremarkable check xray  8/24- has nondisplaced acute fracture of L 3rd middle phalanx- will tape to 2nd toe to help.  18. Constipation  Improving 19. Spasticity  8/25- changed Zanaflex to 6pm nightly-  ` Dantrolene increased to 50 mg QHS/6pm to see if can help spasticity- cannot tolerate Zanaflex during day.   Dantrolene 25 mg QHS/6pm last night- no side effects- will need to be on at least 3-4 days before increasing to 50 mg QHS.   9/1- dantrolene is now 50 mg QHS 20. Bowel incontinence-   9/1- pt  educated that this is normal with stroke pts- will hopefully improve.   LOS: 14 days A FACE TO FACE EVALUATION WAS PERFORMED  Jade [name] 04/27/2021, 11:12 AM

## 2021-04-28 DIAGNOSIS — I10 Essential (primary) hypertension: Secondary | ICD-10-CM

## 2021-04-28 DIAGNOSIS — R7309 Other abnormal glucose: Secondary | ICD-10-CM

## 2021-04-28 LAB — GLUCOSE, CAPILLARY
Glucose-Capillary: 118 mg/dL — ABNORMAL HIGH (ref 70–99)
Glucose-Capillary: 174 mg/dL — ABNORMAL HIGH (ref 70–99)
Glucose-Capillary: 162 mg/dL — ABNORMAL HIGH (ref 70–99)
Glucose-Capillary: 181 mg/dL — ABNORMAL HIGH (ref 70–99)

## 2021-04-28 NOTE — Progress Notes (Signed)
Physical Therapy Session Note  Patient Details  Name: Jade [name] MRN: [id number] Date of Birth: Oct 25, 1955  Today's Date: 04/28/2021 PT Individual Time: 1020-1115 PT Individual Time Calculation (min): 55 min   Short Term Goals:  Week 2:  PT Short Term Goal 1 (Week 2): Pt will perform supine<>sit with mod assist PT Short Term Goal 2 (Week 2): Pt will perform sit<>stands using LRAD with mod assist PT Short Term Goal 3 (Week 2): Pt will perform bed<>chair transfers using LRAD with mod assist PT Short Term Goal 4 (Week 2): Pt will ambulate at least 87ft using LRAD with +2 mod assist  Skilled Therapeutic Interventions/Progress Updates:   Pt received sitting in WC and agreeable to PT. Pt transported to rehab gym in [facility]. Stand pivot transfer to [facility] with mod assist.  Sitting balance postural control training to sit EOB with large wedge forcing anterior pelvic tilt, requiring trunkal extension to compensate. Pronlonged wrist/digit stretch into extension Reaching with LUE while WB through RUE with pressure foam PT to maintain finger/wrist extension. Completed 3 foam lego puzzles. Facilitation to improve weight shift L and thoracic extension. Pt reports pain in th R shoulder. Noted to have 1 finger sublux on the R. PT applied KT tape.         Therapy Documentation Precautions:  Precautions Precautions: Fall Precaution Comments: RUE hemparesis with increased tone, diplopia Restrictions Weight Bearing Restrictions: No    Vital Signs: Therapy Vitals Temp: 98.3 F (36.8 C) Temp Source: Oral Pulse Rate: 99 Resp: 16 BP: (!) 158/99 Patient Position (if appropriate): Sitting Oxygen Therapy SpO2: 100 % O2 Device: Room Air Pain:   denies   Therapy/Group: Individual Therapy  Golden Pop 04/28/2021, 6:14 PM

## 2021-04-28 NOTE — Progress Notes (Signed)
Occupational Therapy Session Note  Patient Details  Name: Jade [name] MRN: [id number] Date of Birth: 1956-07-19  Today's Date: 04/28/2021 OT Individual Time: 0900-1006 OT Individual Time Calculation (min): 66 min    Short Term Goals: Week 3:  OT Short Term Goal 1 (Week 3): Pt will maintain dynamic sitting balance with min assist overall during completion of selfcare tasks. OT Short Term Goal 2 (Week 3): Pt will complete sit to stand for LB dressing tasks with mod assist 3 consecutive sessions. OT Short Term Goal 3 (Week 3): Pt will complete squat pivot transfers to the drop arm commode with min assist.  Skilled Therapeutic Interventions/Progress Updates:    Session 1: (0900-1006)  Pt worked on bathing and dressing during session.  She was able to complete supine to sit with max assist and then completed sit to stand on the Sedan with mod assist with max facilitation for cleaning buttocks and peri area.  She was then rolled to the shower bench and brief and pants were removed with max assist.  She was able to remove her pullover shirt with mod assist.  Bathing was completed with min assist for UB and LB in sitting only.  She needed max hand over hand assist for washing the LUE using the RUE.  Once she dried off, she was taken out to the sink with the [facility] for dressing.  Mod assist for pullover shirt with max assist for donning new brief and TEDs.  Total assist for TEDs and shoes at end of session.  She was left sitting up in the wheelchair with the call button and phone in reach and safety belt in place.    Session 2: (1301-1401)  Pt completed transfer stand pivot to the therapy mat from the wheelchair to start session.  Worked on maintaining upright posture throughout session with focus on RUE weightbearing and functional movement.  RUE was placed on hand paddle to assist with managing finger extension.  Had her focus on functional reaching with the LUE while supporting herself with the right  to pick up and place clothespins.  Increased right lean when reaching with mod facilitation from therapist to maintain upright midline posture and right trunk activation.  Progressed to functional movement using tilted stool with gait belt placed over the left hand in order to maintain contact with the stool.  She was able to complete 3 sets of 10 reps pushing stool to target with increased time, demonstrating active elbow extension and shoulder flexion.  Therapist then placed NMES to the left dorsal forearm for 10 mins at level 15 intensity at the below settings in order to facilitate digit extension.  She tolerated stimulation with cueing to try and straighten the fingers actively along with the stimulation.  Finished session with squat pivot transfer to the wheelchair at mod assist and transfer back to the room.  Call button and phone in reach with safety belt in place.   Sa[place]o Stim One 330 pulse width 35 Hz pulse rate On 8 sec/ off 8 sec Ramp up/ down 2 sec Symmetrical Biphasic wave form  Max intensity at 500 Ohm load     Therapy Documentation Precautions:  Precautions Precautions: Fall Precaution Comments: RUE hemparesis with increased tone, diplopia Restrictions Weight Bearing Restrictions: No  Pain: Pain Assessment Pain Scale: Faces Pain Score: 0-No pain Faces Pain Scale: Hurts a little bit Pain Type: Neuropathic pain Pain Location: Arm Pain Orientation: Right Pain Descriptors / Indicators: Discomfort Pain Onset: With Activity Pain  Intervention(s): Repositioned ADL: See Care Tool Section for some details of mobility and selfcare   Therapy/Group: Individual Therapy  Akram Kissick OTR/L 04/28/2021, 12:16 PM

## 2021-04-28 NOTE — Progress Notes (Signed)
PROGRESS NOTE   Subjective/Complaints: Patient seen sitting up in her chair working with therapy this morning.  She states she slept better overnight.  She states she feels much better this morning.  Discussed tone with therapies, which appears to be better this morning and blood.  ROS: Denies CP, SOB, N/V/D  Objective:   No results found. No results for input(s): WBC, HGB, HCT, PLT in the last 72 hours.  No results for input(s): NA, K, CL, CO2, GLUCOSE, BUN, CREATININE, CALCIUM in the last 72 hours.   Intake/Output Summary (Last 24 hours) at 04/28/2021 1124 Last data filed at 04/28/2021 0900 Gross per 24 hour  Intake 716 ml  Output 950 ml  Net -234 ml         Physical Exam: Vital Signs Blood pressure (!) 174/91, pulse 64, temperature 98.5 F (36.9 C), resp. rate 16, height 5\' 1"  (1.549 m), weight 76.2 kg, SpO2 98 %. Constitutional: No distress . Vital signs reviewed. HENT: Normocephalic.  Atraumatic. Eyes: EOMI. No discharge. Cardiovascular: No JVD.  RRR. Respiratory: Normal effort.  No stridor.  Bilateral clear to auscultation. GI: Non-distended.  BS +. Skin: Warm and dry.  Intact. Psych: Normal mood.  Normal behavior. Musc: No edema in extremities.  No tenderness in extremities. Neurological: Alert Motor: LUE/LLE: 5/5 proximal and distal RUE: Elbow flexion/extension 2/5, stable R lower extremity: Ankle dorsiflexion 1/5 mAS: Right hand 2/4  Assessment/Plan: 1. Functional deficits which require 3+ hours per day of interdisciplinary therapy in a comprehensive inpatient rehab setting. Physiatrist is providing close team supervision and 24 hour management of active medical problems listed below. Physiatrist and rehab team continue to assess barriers to discharge/monitor patient progress toward functional and medical goals  Care Tool:  Bathing    Body parts bathed by patient: Chest, Abdomen, Right arm, Right  upper leg, Left upper leg, Face, Left lower leg   Body parts bathed by helper: Right lower leg, Buttocks, Front perineal area, Left arm     Bathing assist Assist Level: Total Assistance - Patient < 25%     Upper Body Dressing/Undressing Upper body dressing   What is the patient wearing?: Pull over shirt    Upper body assist Assist Level: Maximal Assistance - Patient 25 - 49%    Lower Body Dressing/Undressing Lower body dressing      What is the patient wearing?: Pants     Lower body assist Assist for lower body dressing: Total Assistance - Patient < 25%     Toileting Toileting    Toileting assist Assist for toileting: Dependent - Patient 0% (1 assist using Stedy)     Transfers Chair/bed transfer  Transfers assist     Chair/bed transfer assist level: Moderate Assistance - Patient 50 - 74% (squat pivot)     Locomotion Ambulation   Ambulation assist   Ambulation activity did not occur: Safety/medical concerns  Assist level: 2 helpers (+2 mod A) Assistive device: Hand held assist Max distance: 57ft 2x   Walk 10 feet activity   Assist  Walk 10 feet activity did not occur: Safety/medical concerns  Assist level: 2 helpers (+2 mod A) Assistive device: Hand held assist   Walk  50 feet activity   Assist Walk 50 feet with 2 turns activity did not occur: Safety/medical concerns         Walk 150 feet activity   Assist Walk 150 feet activity did not occur: Safety/medical concerns         Walk 10 feet on uneven surface  activity   Assist Walk 10 feet on uneven surfaces activity did not occur: Safety/medical concerns         Wheelchair     Assist Is the patient using a wheelchair?: Yes      Wheelchair assist level: Dependent - Patient 0% Max wheelchair distance: 50    Wheelchair 50 feet with 2 turns activity    Assist        Assist Level: Dependent - Patient 0%   Wheelchair 150 feet activity     Assist      Assist  Level: Dependent - Patient 0%   Blood pressure (!) 174/91, pulse 64, temperature 98.5 F (36.9 C), resp. rate 16, height 5\' 1"  (1.549 m), weight 76.2 kg, SpO2 98 %.  Medical Problem List and Plan: 1.  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  Antithrombotics: -DVT/anticoagulation: Subcutaneous heparin   8/25- changed to Lovenox 40 mg qday since CrCl is >40             -antiplatelet therapy: Aspirin 325 mg daily and Plavix 75 mg daily x3 months then Plavix alone 3. Pain Management: Neurontin 200 mg nightly, oxycodone as needed, Flexeril as needed  Voltaren gel ordered to left knee on 8/31  Controlled on 9/2 4. Mood: Provide emotional support             -antipsychotic agents: N/A 5. Neuropsych: This patient is capable of making decisions on her own behalf. 6. Skin/Wound Care: Routine skin checks  8/30-  has a scab for "years' on front of scalp- needs Derm after d/c  7. Fluids/Electrolytes/Nutrition: Routine and outs with follow-up chemistries ordered 8.  Symptomatic left ICA stenosis.  Status post left transcarotid artery revascularization 04/06/2021.  Vascular surgery following 9.  Hypertension.  Norvasc 10 mg daily, HCTZ 12.5 mg daily.  Monitor with increased mobility Vitals:   04/28/21 0526 04/28/21 0837  BP: (!) 171/71 (!) 174/91  Pulse: 64   Resp: 16   Temp: 98.5 F (36.9 C)   SpO2: 98%    ?  Trending up, consider medication adjustments if persistent 10.  Diabetes mellitus with hyperglycemia.  Hemoglobin A1c 6.5.  SSI.             -monitor CBG's AC/HS CBG (last 3)  Recent Labs    04/27/21 1619 04/27/21 2104 04/28/21 0612  GLUCAP 196* 200* 118*   Labile on 9/2, monitor trend  11.   Hyperlipidemia.  Lipitor 12.  GERD.  Protonix 13.  CKD stage III.  Baseline creatinine 1.18-1.34.     Creatinine 1.17 on 8/29 14.  Obesity.  BMI 31.74.  Dietary follow-up 15.  COPD.  Quit smoking 33 years ago.  Check oxygen saturations every shift              -oxygen per [place] at  2L currently  16. Urinary retention with painful caths-continue foley   -voiding trial this week once mobility improves?   -flomax on board  Patient wants Foley 9/1- pt admitted having urinary incontinence prior to CVA_ and doesn't want foley removed.  17.  Left toe bruising in sensate foot, pt does not recall an injury , exam unremarkable check xray  8/24- has nondisplaced acute fracture of L 3rd middle phalanx- will tape to 2nd toe to help.  18. Constipation  Improving 19. Spasticity  8/25- changed Zanaflex to 6pm nightly-  ` Dantrolene increased to 50 mg QHS/6pm to see if can help spasticity- cannot tolerate Zanaflex during day.   Dantrolene 25 mg QHS/6pm last night- no side effects- will need to be on at least 3-4 days before increasing to 50 mg QHS.   9/1- dantrolene is now 50 mg QHS 20. Bowel incontinence-   9/1- pt educated that this is normal with stroke pts- will hopefully improve.   LOS: 15 days A FACE TO FACE EVALUATION WAS PERFORMED  Hildred Mollica Karis Juba 04/28/2021, 11:24 AM

## 2021-04-28 NOTE — Progress Notes (Signed)
Physical Therapy Session Note  Patient Details  Name: Jade [name] MRN: [id number] Date of Birth: Dec 24, 1955  Today's Date: 04/28/2021 PT Individual Time: 1115-1200 PT Individual Time Calculation (min): 45 min   Short Term Goals: Week 2:  PT Short Term Goal 1 (Week 2): Pt will perform supine<>sit with mod assist PT Short Term Goal 2 (Week 2): Pt will perform sit<>stands using LRAD with mod assist PT Short Term Goal 3 (Week 2): Pt will perform bed<>chair transfers using LRAD with mod assist PT Short Term Goal 4 (Week 2): Pt will ambulate at least 80ft using LRAD with +2 mod assist  Skilled Therapeutic Interventions/Progress Updates: Pt presents sitting in w/c and agreeable to participate in make-up therapy time.  Pt wheeled to main gym for time conservation.  Pt transferred w/c <> mat table w/ mod A and verbal cues for forward lean and re-positioning on hands as well as LEs  to improve independence w/ transfers.  Pt sat and performed clothespins activity w/ cueing for leaning forward and then returning to upright midline seated position before continuing w/ next pin.  Pt tolerated facilitation of RUE athand and elbow for improved WB.  Pt did require rest from this position 2/2 wrist soreness.  Pt returned to room and remained sitting in w/c w/ chair alarm on and all needs in reach.  RUE supported w/ pillow.     Therapy Documentation Precautions:  Precautions Precautions: Fall Precaution Comments: RUE hemparesis with increased tone, diplopia Restrictions Weight Bearing Restrictions: No General:   Vital Signs: Therapy Vitals BP: (!) 174/91 Pain:0/10 Pain Assessment Pain Scale: 0-10 Pain Score: 0-No pain Mobility:       Therapy/Group: Individual Therapy  Lucio Edward 04/28/2021, 12:02 PM

## 2021-04-29 LAB — GLUCOSE, CAPILLARY
Glucose-Capillary: 142 mg/dL — ABNORMAL HIGH (ref 70–99)
Glucose-Capillary: 185 mg/dL — ABNORMAL HIGH (ref 70–99)
Glucose-Capillary: 231 mg/dL — ABNORMAL HIGH (ref 70–99)
Glucose-Capillary: 135 mg/dL — ABNORMAL HIGH (ref 70–99)

## 2021-04-29 MED ORDER — HYDROCHLOROTHIAZIDE 12.5 MG PO CAPS
25.0000 mg | ORAL_CAPSULE | Freq: Every day | ORAL | Status: DC
  Administered 2021-04-30 – 2021-05-05 (×6): 25 mg via ORAL
  Filled 2021-04-29 (×6): qty 2

## 2021-04-29 MED ORDER — GLIMEPIRIDE 2 MG PO TABS
1.0000 mg | ORAL_TABLET | Freq: Every day | ORAL | Status: DC
  Administered 2021-04-30 – 2021-05-12 (×13): 1 mg via ORAL
  Filled 2021-04-29 (×13): qty 1

## 2021-04-29 NOTE — Progress Notes (Signed)
Physical Therapy Session Note  Patient Details  Name: Jade [name] MRN: [id number] Date of Birth: 22-Apr-1956  Today's Date: 04/29/2021 PT Individual Time: 0905-1000 PT Individual Time Calculation (min): 55 min   Short Term Goals: Week 2:  PT Short Term Goal 1 (Week 2): Pt will perform supine<>sit with mod assist PT Short Term Goal 2 (Week 2): Pt will perform sit<>stands using LRAD with mod assist PT Short Term Goal 3 (Week 2): Pt will perform bed<>chair transfers using LRAD with mod assist PT Short Term Goal 4 (Week 2): Pt will ambulate at least 36ft using LRAD with +2 mod assist  Skilled Therapeutic Interventions/Progress Updates:    Pt received supine in bed and reporting she is feeling better compared to Thursday 9/1 though still having some pain in R UE with "spasms." Pt agreeable to therapy session. Supine>sitting L EOB, HOB flat but using bedrail, with heavy mod assist for trunk upright and scooting hips towards EOB - pt with L posterior trunk lean this AM - pt states this is due to having been in the bed since 6PM last night. Sitting EOB using L UE support on bedrail while therapist donned B LE TED hose and shoes total assist for time management.   R squat pivot EOB>w/c with heavy mod assist for lifting/pivoting hips - continues to require max cuing to sustain anterior trunk lean during transfer (as pt starts to lean posteriorly once hips are lifted from the bed).  Transported to/from gym in w/c for time management and energy conservation.  Sit>stands w/c>L HHA with mod progressed to max assist during session due to fatigue - requires max cuing for anterior trunk lean and sustaining forward weight shift while coming to stand as pt has strong R posterior lean due to pusher tendencies and lack of R hip/knee extension.  Donned R UE GivMohr sling, L LE knee ACE wrap for pain management, and R LE ankle DF assist ACE wrap.  Gait training 57ft, 55ft, and 173ft (seated break between each)  with +2 mod assist via +2 L HHA and +2 assisting to facilitate L weight shift onto L stance limb as pt continues to present with pusher tendencies causing impaired midline orientation and R lean/pushing - requires varying mod/max assist for R LE advancement during swing with increased assist needed with fatigue, assist for reciprocal stepping patter and to avoid excessive hip external rotation and adduction - requires min/mod assist for R knee stance control while facilitating hip extension - repeated cuing throughout for trunk/hip extension to maintain upright posture. Utilized upbeat music throughout gait for motivation per pt request.  Doffed sling and ACE wraps. At end of session pt left seated in w/c with needs in reach, R UE therapeutically positioned on pillows, and seat belt alarm on.   Therapy Documentation Precautions:  Precautions Precautions: Fall Precaution Comments: RUE hemparesis with increased tone, diplopia Restrictions Weight Bearing Restrictions: No   Pain:  Chronic L knee pain and R UE neurologic pain greatest in shoulder, premedicated - pt reports the Kinesiotape donned by therapist yesterday is helping some.   Therapy/Group: Individual Therapy  Ginny Forth , PT, DPT, NCS, CSRS 04/29/2021, 7:53 AM

## 2021-04-29 NOTE — Progress Notes (Signed)
PROGRESS NOTE   Subjective/Complaints: No complaints this morning  ROS: Denies CP, SOB, N/V/D  Objective:   No results found. No results for input(s): WBC, HGB, HCT, PLT in the last 72 hours.  No results for input(s): NA, K, CL, CO2, GLUCOSE, BUN, CREATININE, CALCIUM in the last 72 hours.   Intake/Output Summary (Last 24 hours) at 04/29/2021 1536 Last data filed at 04/29/2021 1500 Gross per 24 hour  Intake 287 ml  Output 1850 ml  Net -1563 ml        Physical Exam: Vital Signs Blood pressure (!) 178/99, pulse (!) 102, temperature (!) 97.5 F (36.4 C), temperature source Oral, resp. rate 16, height 5\' 1"  (1.549 m), weight 76.2 kg, SpO2 98 %. Constitutional: No distress . Vital signs reviewed. HENT: Normocephalic.  Atraumatic. Eyes: EOMI. No discharge. Cardiovascular: tachycardia Respiratory: Normal effort.  No stridor.  Bilateral clear to auscultation. GI: Non-distended.  BS +. Skin: Warm and dry.  Intact. Psych: Normal mood.  Normal behavior. Musc: No edema in extremities.  No tenderness in extremities. Neurological: Alert Motor: LUE/LLE: 5/5 proximal and distal RUE: Elbow flexion/extension 2/5, stable R lower extremity: Ankle dorsiflexion 1/5 mAS: Right hand 2/4  Assessment/Plan: 1. Functional deficits which require 3+ hours per day of interdisciplinary therapy in a comprehensive inpatient rehab setting. Physiatrist is providing close team supervision and 24 hour management of active medical problems listed below. Physiatrist and rehab team continue to assess barriers to discharge/monitor patient progress toward functional and medical goals  Care Tool:  Bathing    Body parts bathed by patient: Chest, Abdomen, Right arm, Right upper leg, Left upper leg, Face, Left lower leg   Body parts bathed by helper: Right lower leg, Buttocks, Front perineal area, Left arm     Bathing assist Assist Level: Total  Assistance - Patient < 25%     Upper Body Dressing/Undressing Upper body dressing   What is the patient wearing?: Pull over shirt    Upper body assist Assist Level: Maximal Assistance - Patient 25 - 49%    Lower Body Dressing/Undressing Lower body dressing      What is the patient wearing?: Pants     Lower body assist Assist for lower body dressing: Total Assistance - Patient < 25%     Toileting Toileting    Toileting assist Assist for toileting: Dependent - Patient 0% (1 assist using Stedy)     Transfers Chair/bed transfer  Transfers assist     Chair/bed transfer assist level: Moderate Assistance - Patient 50 - 74% (squat pivot)     Locomotion Ambulation   Ambulation assist   Ambulation activity did not occur: Safety/medical concerns  Assist level: 2 helpers (+2 mod A) Assistive device: Hand held assist Max distance: 196ft   Walk 10 feet activity   Assist  Walk 10 feet activity did not occur: Safety/medical concerns  Assist level: 2 helpers (+2 mod A) Assistive device: Hand held assist   Walk 50 feet activity   Assist Walk 50 feet with 2 turns activity did not occur: Safety/medical concerns         Walk 150 feet activity   Assist Walk 150 feet activity did  not occur: Safety/medical concerns         Walk 10 feet on uneven surface  activity   Assist Walk 10 feet on uneven surfaces activity did not occur: Safety/medical concerns         Wheelchair     Assist Is the patient using a wheelchair?: Yes      Wheelchair assist level: Dependent - Patient 0% Max wheelchair distance: 50    Wheelchair 50 feet with 2 turns activity    Assist        Assist Level: Dependent - Patient 0%   Wheelchair 150 feet activity     Assist      Assist Level: Dependent - Patient 0%   Blood pressure (!) 178/99, pulse (!) 102, temperature (!) 97.5 F (36.4 C), temperature source Oral, resp. rate 16, height 5\' 1"  (1.549 m),  weight 76.2 kg, SpO2 98 %.  Medical Problem List and Plan: 1.  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  Antithrombotics: -DVT/anticoagulation: Subcutaneous heparin   8/25- changed to Lovenox 40 mg qday since CrCl is >40             -antiplatelet therapy: Aspirin 325 mg daily and Plavix 75 mg daily x3 months then Plavix alone 3. Pain Management: Neurontin 200 mg nightly, oxycodone as needed, Flexeril as needed  Voltaren gel ordered to left knee on 8/31  Controlled on 9/3 4. Mood: Provide emotional support             -antipsychotic agents: N/A 5. Neuropsych: This patient is capable of making decisions on her own behalf. 6. Skin/Wound Care: Routine skin checks  8/30-  has a scab for "years' on front of scalp- needs Derm after d/c  7. Fluids/Electrolytes/Nutrition: Routine and outs with follow-up chemistries ordered 8.  Symptomatic left ICA stenosis.  Status post left transcarotid artery revascularization 04/06/2021.  Vascular surgery following 9.  Hypertension.  Norvasc 10 mg daily, Increase HCTZ to 25 mg daily.  Monitor with increased mobility Vitals:   04/29/21 0448 04/29/21 1458  BP: (!) 158/63 (!) 178/99  Pulse: 64 (!) 102  Resp: 18 16  Temp: 97.7 F (36.5 C) (!) 97.5 F (36.4 C)  SpO2: 97% 98%   10.  Diabetes mellitus with hyperglycemia.  Hemoglobin A1c 6.5.  SSI.             -monitor CBG's AC/HS CBG (last 3)  Recent Labs    04/28/21 2106 04/29/21 0621 04/29/21 1129  GLUCAP 181* 142* 185*  Labile on 9/3: start amaryl 11.  Hyperlipidemia.  Lipitor 12.  GERD.  Protonix 13.  CKD stage III.  Baseline creatinine 1.18-1.34.     Creatinine 1.17 on 8/29, repeat Monday 14.  Obesity.  BMI  31.74.  Dietary follow-up 15.  COPD.  Quit smoking 33 years ago.  Check oxygen saturations every shift              -oxygen per  at 2L currently  16. Urinary retention with painful caths-continue foley   -voiding trial this week once mobility improves?   -flomax on board  Patient wants Foley 9/1- pt admitted having urinary incontinence prior  to CVA_ and doesn't want foley removed.  17.  Left toe bruising in sensate foot, pt does not recall an injury , exam unremarkable check xray  8/24- has nondisplaced acute fracture of L 3rd middle phalanx- will tape to 2nd toe to help.  18. Constipation  Improving 19. Spasticity  8/25- changed Zanaflex to 6pm nightly-  ` Dantrolene increased to 50 mg QHS/6pm to see if can help spasticity- cannot tolerate Zanaflex during day.   Dantrolene 25 mg QHS/6pm last night- no side effects- will need to be on at least 3-4 days before increasing to 50 mg QHS.   9/1- dantrolene is now 50 mg QHS 20. Bowel incontinence-   9/1- pt educated that this is normal with stroke pts- will hopefully improve.   LOS: 16 days A FACE TO FACE EVALUATION WAS PERFORMED  Drema Pry Aleeta Schmaltz 04/29/2021, 3:36 PM

## 2021-04-30 LAB — GLUCOSE, CAPILLARY
Glucose-Capillary: 144 mg/dL — ABNORMAL HIGH (ref 70–99)
Glucose-Capillary: 151 mg/dL — ABNORMAL HIGH (ref 70–99)
Glucose-Capillary: 92 mg/dL (ref 70–99)
Glucose-Capillary: 207 mg/dL — ABNORMAL HIGH (ref 70–99)

## 2021-04-30 NOTE — Progress Notes (Signed)
PROGRESS NOTE   Subjective/Complaints: No complaints this morning Patient's chart reviewed- No issues reported overnight SBP elevated, other vitals stable  ROS: Denies CP, SOB, N/V/D  Objective:   No results found. No results for input(s): WBC, HGB, HCT, PLT in the last 72 hours.  No results for input(s): NA, K, CL, CO2, GLUCOSE, BUN, CREATININE, CALCIUM in the last 72 hours.   Intake/Output Summary (Last 24 hours) at 04/30/2021 1036 Last data filed at 04/30/2021 0800 Gross per 24 hour  Intake 1004 ml  Output 1650 ml  Net -646 ml        Physical Exam: Vital Signs Blood pressure (!) 155/72, pulse 76, temperature 98.6 F (37 C), resp. rate 18, height 5\' 1"  (1.549 m), weight 76.2 kg, SpO2 100 %. Gen: no distress, normal appearing HEENT: oral mucosa pink and moist, NCAT Cardio: Reg rate Chest: normal effort, normal rate of breathing Abd: soft, non-distended Ext: no edema Psych: Normal mood.  Normal behavior. Musc: No edema in extremities.  No tenderness in extremities. Neurological: Alert Motor: LUE/LLE: 5/5 proximal and distal RUE: Elbow flexion/extension 2/5, stable R lower extremity: Ankle dorsiflexion 1/5 mAS: Right hand 2/4  Assessment/Plan: 1. Functional deficits which require 3+ hours per day of interdisciplinary therapy in a comprehensive inpatient rehab setting. Physiatrist is providing close team supervision and 24 hour management of active medical problems listed below. Physiatrist and rehab team continue to assess barriers to discharge/monitor patient progress toward functional and medical goals  Care Tool:  Bathing    Body parts bathed by patient: Chest, Abdomen, Right arm, Right upper leg, Left upper leg, Face, Left lower leg   Body parts bathed by helper: Right lower leg, Buttocks, Front perineal area, Left arm     Bathing assist Assist Level: Total Assistance - Patient < 25%     Upper Body  Dressing/Undressing Upper body dressing   What is the patient wearing?: Pull over shirt    Upper body assist Assist Level: Maximal Assistance - Patient 25 - 49%    Lower Body Dressing/Undressing Lower body dressing      What is the patient wearing?: Pants     Lower body assist Assist for lower body dressing: Total Assistance - Patient < 25%     Toileting Toileting    Toileting assist Assist for toileting: Dependent - Patient 0% (1 assist using Stedy)     Transfers Chair/bed transfer  Transfers assist     Chair/bed transfer assist level: Moderate Assistance - Patient 50 - 74% (squat pivot)     Locomotion Ambulation   Ambulation assist   Ambulation activity did not occur: Safety/medical concerns  Assist level: 2 helpers (+2 mod A) Assistive device: Hand held assist Max distance: 123ft   Walk 10 feet activity   Assist  Walk 10 feet activity did not occur: Safety/medical concerns  Assist level: 2 helpers (+2 mod A) Assistive device: Hand held assist   Walk 50 feet activity   Assist Walk 50 feet with 2 turns activity did not occur: Safety/medical concerns         Walk 150 feet activity   Assist Walk 150 feet activity did not occur: Safety/medical concerns  Walk 10 feet on uneven surface  activity   Assist Walk 10 feet on uneven surfaces activity did not occur: Safety/medical concerns         Wheelchair     Assist Is the patient using a wheelchair?: Yes      Wheelchair assist level: Dependent - Patient 0% Max wheelchair distance: 50    Wheelchair 50 feet with 2 turns activity    Assist        Assist Level: Dependent - Patient 0%   Wheelchair 150 feet activity     Assist      Assist Level: Dependent - Patient 0%   Blood pressure (!) 155/72, pulse 76, temperature 98.6 F (37 C), resp. rate 18, height 5\' 1"  (1.549 m), weight 76.2 kg, SpO2 100 %.  Medical Problem List and Plan: 1.  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  Antithrombotics: -DVT/anticoagulation: Subcutaneous heparin   8/25- changed to Lovenox 40 mg qday since CrCl is >40             -antiplatelet therapy: Aspirin 325 mg daily and Plavix 75 mg daily x3 months then Plavix alone 3. Pain: Continue Neurontin 200 mg nightly, oxycodone as needed, Flexeril as needed  Voltaren gel ordered to left knee on 8/31  Controlled on 9/4 4. Mood: Provide emotional support             -antipsychotic agents: N/A 5. Neuropsych: This patient is capable of making decisions on her own behalf. 6. Skin/Wound Care: Routine skin checks  8/30-  has a scab for "years' on front of scalp- needs Derm after d/c  7. Fluids/Electrolytes/Nutrition: Routine and outs with follow-up chemistries ordered 8.  Symptomatic left ICA stenosis.  Status post left transcarotid artery revascularization 04/06/2021.  Vascular surgery following 9.  Hypertension.  Norvasc 10 mg daily, Increase HCTZ to 25 mg daily.  Monitor with increased mobility  9/4: monitor for effects of yesterday's increase in HCTZ Vitals:   04/29/21 1925 04/30/21 0620  BP: (!) 147/80 (!) 155/72  Pulse: 85 76  Resp: 16 18  Temp: 98.2 F (36.8 C) 98.6 F (37 C)  SpO2: 100% 100%   10.  Diabetes mellitus with hyperglycemia.  Hemoglobin A1c 6.5.  SSI.             -monitor CBG's AC/HS CBG (last 3)  Recent Labs    04/29/21 1617 04/29/21 2107 04/30/21 0623  GLUCAP 231* 135* 144*  Labile on 9/3: start amaryl 9/4: monitor for effects of yesterday's change.  11.  Hyperlipidemia.  Continue Lipitor 12.  GERD.  Protonix 13.  CKD stage III.  Baseline creatinine 1.18-1.34.     Creatinine 1.17 on 8/29, repeat  Monday 14.  Obesity.  BMI 31.74.  Dietary follow-up 15.  COPD.  Quit smoking 33 years ago.  Check oxygen saturations every shift              -oxygen per [place] at 2L currently  16. Urinary retention with painful caths-continue foley   -voiding trial this week once mobility improves?   -flomax on board  Patient wants Foley 9/1- pt admitted having urinary incontinence prior  to CVA_ and doesn't want foley removed.  17.  Left toe bruising in sensate foot, pt does not recall an injury , exam unremarkable check xray  8/24- has nondisplaced acute fracture of L 3rd middle phalanx- will tape to 2nd toe to help.  18. Constipation  Improving 19. Spasticity  8/25- changed Zanaflex to 6pm nightly-  ` Dantrolene increased to 50 mg QHS/6pm to see if can help spasticity- cannot tolerate Zanaflex during day.   Dantrolene 25 mg QHS/6pm last night- no side effects- will need to be on at least 3-4 days before increasing to 50 mg QHS.   9/1- dantrolene is now 50 mg QHS 20. Bowel incontinence-   9/1- pt educated that this is normal with stroke pts- will hopefully improve.   LOS: 17 days A FACE TO FACE EVALUATION WAS PERFORMED  Drema Pry Nassim Cosma 04/30/2021, 10:36 AM

## 2021-05-01 DIAGNOSIS — I63039 Cerebral infarction due to thrombosis of unspecified carotid artery: Secondary | ICD-10-CM

## 2021-05-01 LAB — CBC WITH DIFFERENTIAL/PLATELET
Abs Immature Granulocytes: 0.03 10*3/uL (ref 0.00–0.07)
Basophils Absolute: 0.1 10*3/uL (ref 0.0–0.1)
Basophils Relative: 1 %
Eosinophils Absolute: 0.4 10*3/uL (ref 0.0–0.5)
Eosinophils Relative: 5 %
HCT: 38.1 % (ref 36.0–46.0)
Hemoglobin: 12.8 g/dL (ref 12.0–15.0)
Immature Granulocytes: 0 %
Lymphocytes Relative: 33 %
Lymphs Abs: 2.5 10*3/uL (ref 0.7–4.0)
MCH: 32.7 pg (ref 26.0–34.0)
MCHC: 33.6 g/dL (ref 30.0–36.0)
MCV: 97.4 fL (ref 80.0–100.0)
Monocytes Absolute: 0.6 10*3/uL (ref 0.1–1.0)
Monocytes Relative: 8 %
Neutro Abs: 4 10*3/uL (ref 1.7–7.7)
Neutrophils Relative %: 53 %
Platelets: 342 10*3/uL (ref 150–400)
RBC: 3.91 MIL/uL (ref 3.87–5.11)
RDW: 13.9 % (ref 11.5–15.5)
WBC: 7.6 10*3/uL (ref 4.0–10.5)
nRBC: 0 % (ref 0.0–0.2)

## 2021-05-01 LAB — GLUCOSE, CAPILLARY
Glucose-Capillary: 115 mg/dL — ABNORMAL HIGH (ref 70–99)
Glucose-Capillary: 150 mg/dL — ABNORMAL HIGH (ref 70–99)
Glucose-Capillary: 121 mg/dL — ABNORMAL HIGH (ref 70–99)

## 2021-05-01 LAB — BASIC METABOLIC PANEL
Anion gap: 12 (ref 5–15)
BUN: 20 mg/dL (ref 8–23)
CO2: 25 mmol/L (ref 22–32)
Calcium: 10.2 mg/dL (ref 8.9–10.3)
Chloride: 102 mmol/L (ref 98–111)
Creatinine, Ser: 1.08 mg/dL — ABNORMAL HIGH (ref 0.44–1.00)
GFR, Estimated: 57 mL/min — ABNORMAL LOW (ref >60)
Glucose, Bld: 108 mg/dL — ABNORMAL HIGH (ref 70–99)
Potassium: 3.2 mmol/L — ABNORMAL LOW (ref 3.5–5.1)
Sodium: 139 mmol/L (ref 135–145)

## 2021-05-01 MED ORDER — POTASSIUM CHLORIDE CRYS ER 10 MEQ PO TBCR
10.0000 meq | EXTENDED_RELEASE_TABLET | Freq: Two times a day (BID) | ORAL | Status: DC
Start: 2021-05-01 — End: 2021-05-03
  Administered 2021-05-01 – 2021-05-03 (×4): 10 meq via ORAL
  Filled 2021-05-01 (×4): qty 1

## 2021-05-01 MED ORDER — POTASSIUM CHLORIDE CRYS ER 10 MEQ PO TBCR: 10.0000 meq | EXTENDED_RELEASE_TABLET | Freq: Every day | ORAL | Status: DC

## 2021-05-01 NOTE — Progress Notes (Signed)
Occupational Therapy Session Note  Patient Details  Name: Jade [name] MRN: [id number] Date of Birth: 23-Apr-1956  Today's Date: 05/01/2021 OT Individual Time: 1405-1509 OT Individual Time Calculation (min): 64 min    Short Term Goals: Week 3:  OT Short Term Goal 1 (Week 3): Pt will maintain dynamic sitting balance with min assist overall during completion of selfcare tasks. OT Short Term Goal 2 (Week 3): Pt will complete sit to stand for LB dressing tasks with mod assist 3 consecutive sessions. OT Short Term Goal 3 (Week 3): Pt will complete squat pivot transfers to the drop arm commode with min assist.  Skilled Therapeutic Interventions/Progress Updates:    Pt completed shower and dressing during session.  Max assist for transfer to the shower bench with use of the wheelchair.  She needed max assist for removal of LB clothing with min assist for UB clothing.  She was able to complete bathing at [facility] assist for UB and max assist for LB sit to stand.  She completed UB dressing with mod assist after shower with max assist for LB.  Antony Salmon was used for transfer out of the shower for safety and time management.  Integration of the RUE was implemented throughout session for holding the soap to be opened as well as pouring it onto a washcloth with max hand over hand.  Max assist was also needed for using the RUE to wash the LUE as well.  Still with increased flexor tone in the right digit flexors but pt was able to exhibit some active thumb extension today prior to ADL tasks.  Total assist for donning TEDs and shoes secondary to time.  Pt left with call button and phone in reach and safety belt in place.     Therapy Documentation Precautions:  Precautions Precautions: Fall Precaution Comments: RUE hemparesis with increased tone, diplopia Restrictions Weight Bearing Restrictions: No  Pain: Pain Assessment Pain Scale: Faces Pain Score: 0-No pain ADL: See Care Tool Section for some details of  mobility and selfcare   Therapy/Group: Individual Therapy  Nickolai Rinks OTR/L 05/01/2021, 4:18 PM

## 2021-05-01 NOTE — Progress Notes (Addendum)
PROGRESS NOTE   Subjective/Complaints:  Discussed KCL supp  ROS: Denies CP, SOB, N/V/D  Objective:   No results found. Recent Labs    05/01/21 0558  WBC 7.6  HGB 12.8  HCT 38.1  PLT 342    Recent Labs    05/01/21 0558  NA 139  K 3.2*  CL 102  CO2 25  GLUCOSE 108*  BUN 20  CREATININE 1.08*  CALCIUM 10.2     Intake/Output Summary (Last 24 hours) at 05/01/2021 0909 Last data filed at 05/01/2021 0700 Gross per 24 hour  Intake 834 ml  Output 1525 ml  Net -691 ml         Physical Exam: Vital Signs Blood pressure (!) 172/75, pulse 69, temperature 98.6 F (37 C), resp. rate 16, height 5\' 1"  (1.549 m), weight 76.2 kg, SpO2 99 %.  General: No acute distress Mood and affect are appropriate Heart: Regular rate and rhythm no rubs murmurs or extra sounds Lungs: Clear to auscultation, breathing unlabored, no rales or wheezes Abdomen: Positive bowel sounds, soft nontender to palpation, nondistended Extremities: No clubbing, cyanosis, or edema Skin: No evidence of breakdown, no evidence of rash   RUE: Elbow flexion/extension 2/5, stable R lower extremity: Ankle dorsiflexion 1/5 mAS: Right hand 2/4  Assessment/Plan: 1. Functional deficits which require 3+ hours per day of interdisciplinary therapy in a comprehensive inpatient rehab setting. Physiatrist is providing close team supervision and 24 hour management of active medical problems listed below. Physiatrist and rehab team continue to assess barriers to discharge/monitor patient progress toward functional and medical goals  Care Tool:  Bathing    Body parts bathed by patient: Chest, Abdomen, Right arm, Right upper leg, Left upper leg, Face, Left lower leg   Body parts bathed by helper: Right lower leg, Buttocks, Front perineal area, Left arm     Bathing assist Assist Level: Total Assistance - Patient < 25%     Upper Body Dressing/Undressing Upper  body dressing   What is the patient wearing?: Pull over shirt    Upper body assist Assist Level: Maximal Assistance - Patient 25 - 49%    Lower Body Dressing/Undressing Lower body dressing      What is the patient wearing?: Pants     Lower body assist Assist for lower body dressing: Total Assistance - Patient < 25%     Toileting Toileting    Toileting assist Assist for toileting: Dependent - Patient 0% (1 assist using Stedy)     Transfers Chair/bed transfer  Transfers assist     Chair/bed transfer assist level: Moderate Assistance - Patient 50 - 74% (squat pivot)     Locomotion Ambulation   Ambulation assist   Ambulation activity did not occur: Safety/medical concerns  Assist level: 2 helpers (+2 mod A) Assistive device: Hand held assist Max distance: 131ft   Walk 10 feet activity   Assist  Walk 10 feet activity did not occur: Safety/medical concerns  Assist level: 2 helpers (+2 mod A) Assistive device: Hand held assist   Walk 50 feet activity   Assist Walk 50 feet with 2 turns activity did not occur: Safety/medical concerns  Walk 150 feet activity   Assist Walk 150 feet activity did not occur: Safety/medical concerns         Walk 10 feet on uneven surface  activity   Assist Walk 10 feet on uneven surfaces activity did not occur: Safety/medical concerns         Wheelchair     Assist Is the patient using a wheelchair?: Yes      Wheelchair assist level: Dependent - Patient 0% Max wheelchair distance: 50    Wheelchair 50 feet with 2 turns activity    Assist        Assist Level: Dependent - Patient 0%   Wheelchair 150 feet activity     Assist      Assist Level: Dependent - Patient 0%   Blood pressure (!) 172/75, pulse 69, temperature 98.6 F (37 C), resp. rate 16, height 5\' 1"  (1.549 m), weight 76.2 kg, SpO2 99 %.  Medical Problem List and Plan: 1.  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  Antithrombotics: -DVT/anticoagulation: Subcutaneous heparin   8/25- changed to Lovenox 40 mg qday since CrCl is >40             -antiplatelet therapy: Aspirin 325 mg daily and Plavix 75 mg daily x3 months then Plavix alone 3. Pain: Continue Neurontin 200 mg nightly, oxycodone as needed, Flexeril as needed  Voltaren gel ordered to left knee on 8/31  Controlled on 9/5 4. Mood: Provide emotional support             -antipsychotic agents: N/A 5. Neuropsych: This patient is capable of making decisions on her own behalf. 6. Skin/Wound Care: Routine skin checks  8/30-  has a scab for "years' on front of scalp- needs Derm after d/c  7. Fluids/Electrolytes/Nutrition: Routine and outs with follow-up chemistries ordered 8.  Symptomatic left ICA stenosis.  Status post left transcarotid artery revascularization 04/06/2021.  Vascular surgery following 9.  Hypertension.  Norvasc 10 mg daily, Increase HCTZ to 25 mg daily.  Monitor with increased mobility  9/4: monitor for effects of yesterday's increase in HCTZ Vitals:   04/30/21 1913 05/01/21 0534  BP: (!) 147/80 (!) 172/75  Pulse: 98 69  Resp: 16 16  Temp: 98.6 F (37 C) 98.6 F (37 C)  SpO2: 100% 99%  Labile cont to monitor on current dose meds  10.  Diabetes mellitus with hyperglycemia.  Hemoglobin A1c 6.5.  SSI.             -monitor CBG's AC/HS CBG (last 3)  Recent Labs    04/30/21 1626 04/30/21 2103 05/01/21 0620  GLUCAP 92 207* 115*   Some lability cont to monitor on current dose , may need bed time  snack  11.  Hyperlipidemia.  Continue Lipitor 12.  GERD.  Protonix 13.  CKD stage III.  Baseline creatinine 1.18-1.34.     Creatinine 1.17 on 8/29,  stable at 1.08 on 9/5 14.  Obesity.  BMI 31.74.  Dietary follow-up 15.  COPD.  Quit smoking 33 years ago.  Check oxygen saturations every shift              -  oxygen per [place] [facility] at 2L currently  16. Urinary retention with painful caths-continue foley   -voiding trial this week once mobility improves?   -flomax on board  Patient wants Foley 9/1- pt admitted having urinary incontinence prior to CVA_ and doesn't want foley removed.  17.  Left toe bruising in sensate foot, pt does not recall an injury , exam unremarkable check xray  8/24- has nondisplaced acute fracture of L 3rd middle phalanx- will tape to 2nd toe to help.  18. Constipation  Improving 19. Spasticity  8/25- changed Zanaflex to 6pm nightly-  ` Dantrolene increased to 50 mg QHS/6pm to see if can help spasticity- cannot tolerate Zanaflex during day.   Dantrolene 25 mg QHS/6pm last night- no side effects- will need to be on at least 3-4 days before increasing to 50 mg QHS.   9/1- dantrolene is now 50 mg QHS 20. Bowel incontinence-   9/1- pt educated that this is normal with stroke pts- will hopefully improve.  21.  hypoK+ likely due to HCTZ addition will add KCL- wants a "small K+ tablet", has taken K+ supplement at home "99mg " LOS: 18 days A FACE TO FACE EVALUATION WAS PERFORMED  05/01/2021, 9:09 AM

## 2021-05-01 NOTE — Progress Notes (Signed)
Physical Therapy Session Note  Patient Details  Name: Jade [name] MRN: [id number] Date of Birth: 1956/05/29  Today's Date: 05/01/2021 PT Individual Time: 2376-2831 and 1102-1206 PT Individual Time Calculation (min): 59 min and 64 min  Short Term Goals: Week 2:  PT Short Term Goal 1 (Week 2): Pt will perform supine<>sit with mod assist PT Short Term Goal 2 (Week 2): Pt will perform sit<>stands using LRAD with mod assist PT Short Term Goal 3 (Week 2): Pt will perform bed<>chair transfers using LRAD with mod assist PT Short Term Goal 4 (Week 2): Pt will ambulate at least 82ft using LRAD with +2 mod assist  Skilled Therapeutic Interventions/Progress Updates:    Session 1: Pt received supine in bed and agreeable to therapy session. Pt eager to show therapist increased R UE activation into shoulder external rotation with elbow extension. Supine>sitting L EOB, HOB slightly elevated and using bedrail, with light mod assist for trunk upright - cuing for sequencing and R LE movement to increase pt independence.  Sitting EOB with intermittent min/mod assist to recover trunk balance due to L posterior lean/LOB while therapist donned shorts and shoes total assist for time management. Sit>stand EOB>L UE support on bedrail with mod assist for lifting to stand transitioned to +2 L HHA during total assist to pull pants up over hips. R squat pivot EOB>w/c with mod assist for lifting/pivoting hips and continued cuing for increased and sustained anterior trunk lean/weight shift during transfer. Transported to/from gym in w/c for time management and energy conservation.   Sit>stand from w/c using L UE support on armrest progressed to L HHA with mod assist for lifting to stand and balance due to R posterior lean.   Donned R LE [place] Walk-on PLS AFO to improved R LE swing phase advancement. Donned L knee ACE wrap for pain management. Gait training 41ft, 18ft using L HHA from +2 assist with mod assist from  therapist and min progressed to mod assist from +2 at end of each walk due to fatigue as pt requiring increased assist to weight shift onto L stance phase to improve R LE swing advancement - pt able to advance R LE ~50-75% during swing with improvement in R LE alignment throughout swing wearing AFO compared to ACE wrap - continues to require min/mod assist to block R knee during stance phase while facilitating increased hip/knee extension.  Block practice sit<>stand transfer training 3x targeting increased anterior trunk lean/weight shift coming to stand along with L weight shift and increased R hip/knee extension once in standing to achieve midline. At end of session pt left seated in w/c with needs in reach, R UE therapeutically positioned on pillows, seat belt alarm on, and needs in reach.  Session 2: Pt received sitting in w/c and agreeable to therapy session. Discussed DME needs with more finalized recommendations being determined closer to D/C based on pt progress but likely need for wheelchair upon discharge.  Transported to/from gym in w/c for time management and energy conservation. Continuing to utilize R LE [place] Walk-on PLS AFO for gait training. Pt reports MD concern for skin breakdown with this brace due to pt's hx of diabetes and impaired sensation therefore MD inquiring about use of double upright hinged brace - will discuss with PSO when orthotics consult is scheduled - removed PLS AFO at end of session. Sit>stand w/c>L UE support on litegait with heavy min assist progressed to mod assist for standing balance while donning litegait harness - cuing for increased R  hip/knee extension and L weight shift.  Overground gait training 17ft 2x using litegait for safety but not true body weight support with +2 assist for litegait management. Requires mod assist for weight shifting and R LE gait mechanics - pt able to advance R LE ~50%-80% of the way in swing approximately 30% of the time (donned toe  cap during 2nd gait trial but no significant improvement compared to without) but then requires increased assist to advance with fatigue - max cuing for L weight shift during L stance phase to allow improved R swing - min/mod assist for increased R weight shift and R hip/knee extension during R stance - min/mod cuing for sustained trunk/hip extension for upright posture throughout. Therapist providing patient with uplifting music during 2nd walk per pt request. Doffed litegait harness, R LE AFO, and L knee ACE wrap.   Transported back to room and left pt seated in w/c with needs in reach, R UE therapeutically positioned on pillows, and seat belt alarm on.   Therapy Documentation Precautions:  Precautions Precautions: Fall Precaution Comments: RUE hemparesis with increased tone, diplopia Restrictions Weight Bearing Restrictions: No   Pain:  Session 1: Chronic L knee pain - ACE wrap for pain management.  Session 2:  Chronic L knee pain - Voltaren gel and ACE wrap for pain management.   Therapy/Group: Individual Therapy  Ginny Forth , PT, DPT, NCS, CSRS 05/01/2021, 7:52 AM

## 2021-05-02 LAB — GLUCOSE, CAPILLARY
Glucose-Capillary: 144 mg/dL — ABNORMAL HIGH (ref 70–99)
Glucose-Capillary: 157 mg/dL — ABNORMAL HIGH (ref 70–99)
Glucose-Capillary: 189 mg/dL — ABNORMAL HIGH (ref 70–99)
Glucose-Capillary: 116 mg/dL — ABNORMAL HIGH (ref 70–99)

## 2021-05-02 MED ORDER — MIRABEGRON ER 25 MG PO TB24
25.0000 mg | ORAL_TABLET | Freq: Every day | ORAL | Status: DC
  Administered 2021-05-02 – 2021-05-10 (×9): 25 mg via ORAL
  Filled 2021-05-02 (×9): qty 1

## 2021-05-02 NOTE — Progress Notes (Signed)
Occupational Therapy Session Note  Patient Details  Name: Jade [name] MRN: [id number] Date of Birth: 12-05-1955  Today's Date: 05/02/2021 OT Individual Time: 1107-1202 OT Individual Time Calculation (min): 55 min    Short Term Goals: Week 3:  OT Short Term Goal 1 (Week 3): Pt will maintain dynamic sitting balance with min assist overall during completion of selfcare tasks. OT Short Term Goal 2 (Week 3): Pt will complete sit to stand for LB dressing tasks with mod assist 3 consecutive sessions. OT Short Term Goal 3 (Week 3): Pt will complete squat pivot transfers to the drop arm commode with min assist.  Skilled Therapeutic Interventions/Progress Updates:    Session 1: (5093-2671) Pt in wheelchair to start session.  Max assist for stand pivot transfer to the therapy mat on the right side secondary to right hemiparesis and right side pushing.  Had her transition to supine with mod assist and then left sidelying at the same level.  Therapist completed scapular mobilizations into depression and adduction.  Pt was able to actively assist with these movements as well in sidelying.  Transitioned to supine with mod assist and worked on shoulder stretching into flexion as well as AAROM.  She was able to tolerate PROM up to approximately 120 degrees with slight end range pain.  Noted scapular movement during PROM as well.  With therapist holding the arm at 90 degrees shoulder flexion, she was able to activate some triceps extension, but noted increased flexor tone in the digits.  Worked on rolling to the right for increased weightbearing over the right scapula with min facilitation with transition to sitting at max assist level.  Therapist applied NMES to the dorsal forearm at level 15 using the [place] Stim One for activation of digit extension and finger extension.  Had her work on digit extension along with the stimulation while working on simulated functional reach.  She tolerated 15 mins without any  adverse reactions to stimulation.  Finished session with transfer stand pivot back to the wheelchair at max assist level with return to the room.  Elbow pad in place for comfort with the RUE positioned on pillows.  Call button and phone in reach with safety belt in place  Session 2: ([id number])  Pt in wheelchair to start with transfer down to the tub room for education on tub/shower transfers.  Discussed pt's bathroom setup at home with positioning of the bath tub with need for a shower bench at this time in order to get into and out of the tub safely.  Based on report form pt the size of her bathroom is small and will likely not allow for wheelchair ate this time.  She also is not going to be proficient enough with use of the walker to try and walk into the bathroom with family.   Pt reports that her son is going to try and change out the entrance to the bathroom, making it wider as well as putting in a walk-in shower.  Feel that more than likely she will have to wait until the shower is put in and when she is more mobile to safely navigate this task.  Finished session with return to the room and PT in for next session.     Therapy Documentation Precautions:  Precautions Precautions: Fall Precaution Comments: RUE hemparesis with increased tone, diplopia Restrictions Weight Bearing Restrictions: No  Pain: Pain Assessment Pain Scale: Faces Faces Pain Scale: No hurt Pain Type: Neuropathic pain Pain Location: Arm Pain Orientation:  Right Pain Descriptors / Indicators: Discomfort Pain Onset: With Activity Pain Intervention(s): Repositioned;Emotional support ADL: See Care Tool Section for some details of mobility and selfcare   Therapy/Group: Individual Therapy  Reon Hunley OTR/L 05/02/2021, 1:11 PM

## 2021-05-02 NOTE — Progress Notes (Signed)
PROGRESS NOTE   Subjective/Complaints:  Discussed foley catheter and risk of UTI, did not want to pursue voiding trial  but now is agreeable  Discussed K+ supplementation   ROS: Denies CP, SOB, N/V/D  Objective:   No results found. Recent Labs    05/01/21 0558  WBC 7.6  HGB 12.8  HCT 38.1  PLT 342     Recent Labs    05/01/21 0558  NA 139  K 3.2*  CL 102  CO2 25  GLUCOSE 108*  BUN 20  CREATININE 1.08*  CALCIUM 10.2      Intake/Output Summary (Last 24 hours) at 05/02/2021 0802 Last data filed at 05/02/2021 0747 Gross per 24 hour  Intake 680 ml  Output 1575 ml  Net -895 ml         Physical Exam: Vital Signs Blood pressure (!) 157/90, pulse 74, temperature 98.5 F (36.9 C), temperature source Oral, resp. rate 16, height 5\' 1"  (1.549 m), weight 76.2 kg, SpO2 98 %.  General: No acute distress Mood and affect are appropriate Heart: Regular rate and rhythm no rubs murmurs or extra sounds Lungs: Clear to auscultation, breathing unlabored, no rales or wheezes Abdomen: Positive bowel sounds, soft nontender to palpation, nondistended Extremities: No clubbing, cyanosis, or edema Skin: No evidence of breakdown, no evidence of rash   RUE: Elbow flexion/extension 2/5, stable R lower extremity: Ankle dorsiflexion 1/5 mAS: Right hand 2/4  Assessment/Plan: 1. Functional deficits which require 3+ hours per day of interdisciplinary therapy in a comprehensive inpatient rehab setting. Physiatrist is providing close team supervision and 24 hour management of active medical problems listed below. Physiatrist and rehab team continue to assess barriers to discharge/monitor patient progress toward functional and medical goals  Care Tool:  Bathing    Body parts bathed by patient: Chest, Abdomen, Right arm, Right upper leg, Left upper leg, Face, Left lower leg   Body parts bathed by helper: Right lower leg, Buttocks,  Front perineal area, Left arm     Bathing assist Assist Level: Total Assistance - Patient < 25%     Upper Body Dressing/Undressing Upper body dressing   What is the patient wearing?: Pull over shirt    Upper body assist Assist Level: Maximal Assistance - Patient 25 - 49%    Lower Body Dressing/Undressing Lower body dressing      What is the patient wearing?: Pants     Lower body assist Assist for lower body dressing: Total Assistance - Patient < 25%     Toileting Toileting    Toileting assist Assist for toileting: Dependent - Patient 0% (1 assist using Stedy)     Transfers Chair/bed transfer  Transfers assist     Chair/bed transfer assist level: Moderate Assistance - Patient 50 - 74% (squat pivot)     Locomotion Ambulation   Ambulation assist   Ambulation activity did not occur: Safety/medical concerns  Assist level: 2 helpers (+2 mod A) Assistive device: Hand held assist Max distance: 46ft   Walk 10 feet activity   Assist  Walk 10 feet activity did not occur: Safety/medical concerns  Assist level: 2 helpers (+2 mod A) Assistive device: Hand held assist  Walk 50 feet activity   Assist Walk 50 feet with 2 turns activity did not occur: Safety/medical concerns         Walk 150 feet activity   Assist Walk 150 feet activity did not occur: Safety/medical concerns         Walk 10 feet on uneven surface  activity   Assist Walk 10 feet on uneven surfaces activity did not occur: Safety/medical concerns         Wheelchair     Assist Is the patient using a wheelchair?: Yes      Wheelchair assist level: Dependent - Patient 0% Max wheelchair distance: 50    Wheelchair 50 feet with 2 turns activity    Assist        Assist Level: Dependent - Patient 0%   Wheelchair 150 feet activity     Assist      Assist Level: Dependent - Patient 0%   Blood pressure (!) 157/90, pulse 74, temperature 98.5 F (36.9 C),  temperature source Oral, resp. rate 16, height 5\' 1"  (1.549 m), weight 76.2 kg, SpO2 98 %.  Medical Problem List and Plan: 1.  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  Antithrombotics: -DVT/anticoagulation: Subcutaneous heparin   8/25- changed to Lovenox 40 mg qday since CrCl is >40             -antiplatelet therapy: Aspirin 325 mg daily and Plavix 75 mg daily x3 months then Plavix alone 3. Pain: Continue Neurontin 200 mg nightly, oxycodone as needed, Flexeril as needed  Voltaren gel ordered to left knee on 8/31  Controlled on 9/5 4. Mood: Provide emotional support             -antipsychotic agents: N/A 5. Neuropsych: This patient is capable of making decisions on her own behalf. 6. Skin/Wound Care: Routine skin checks  8/30-  has a scab for "years' on front of scalp- needs Derm after d/c  7. Fluids/Electrolytes/Nutrition: Routine and outs with follow-up chemistries ordered 8.  Symptomatic left ICA stenosis.  Status post left transcarotid artery revascularization 04/06/2021.  Vascular surgery following 9.  Hypertension.  Norvasc 10 mg daily, Increase HCTZ to 25 mg daily.  Monitor with increased mobility  9/4: monitor for effects of yesterday's increase in HCTZ Vitals:   05/01/21 2010 05/02/21 0524  BP: (!) 151/72 (!) 157/90  Pulse: 85 74  Resp: 16 16  Temp: 98.5 F (36.9 C) 98.5 F (36.9 C)  SpO2: 99% 98%  Labile cont to monitor on current dose meds  10.  Diabetes mellitus with hyperglycemia.  Hemoglobin A1c 6.5.  SSI.             -monitor CBG's AC/HS CBG (last 3)  Recent Labs    05/01/21 1716 05/01/21 2107 05/02/21 0620  GLUCAP 150* 121* 144*   Controlled  11.   Hyperlipidemia.  Continue Lipitor 12.  GERD.  Protonix 13.  CKD stage III.  Baseline creatinine 1.18-1.34.     Creatinine 1.17 on 8/29, stable at 1.08 on 9/5 14.  Obesity.  BMI 31.74.  Dietary follow-up 15.  COPD.  Quit smoking 33  years ago.  Check oxygen saturations every shift              -oxygen per [place] at 2L currently  16. Urinary retention with painful caths-continue foley   -voiding trial this week once mobility improves?   -flomax on board  Patient wants Foley 9/1- pt admitted having urinary incontinence prior to CVA_ and doesn't want foley removed.  9/6 discussed risk of UTI and availability of meds to "slow down" bladder , pt agreeable to voiding trial  17.  Left toe bruising in sensate foot, pt does not recall an injury , exam unremarkable check xray  8/24- has nondisplaced acute fracture of L 3rd middle phalanx- will tape to 2nd toe to help.  18. Constipation  Improving 19. Spasticity  8/25- changed Zanaflex to 6pm nightly-  ` Dantrolene increased to 50 mg QHS/6pm to see if can help spasticity- cannot tolerate Zanaflex during day.   Dantrolene 25 mg QHS/6pm last night- no side effects- will need to be on at least 3-4 days before increasing to 50 mg QHS.   9/1- dantrolene is now 50 mg QHS 20. Bowel incontinence-   9/1- pt educated that this is normal with stroke pts- will hopefully improve.  21.  hypoK+ likely due to HCTZ addition will add KCL- wants a "small K+ tablet", has taken K+ supplement at home "99mg " KCL is ~ 400mg  K gluconate, discussed dietary options to increase K+ but most are high in glucose LOS: 19 days A FACE TO FACE EVALUATION WAS PERFORMED  05/02/2021, 8:02 AM

## 2021-05-02 NOTE — Progress Notes (Signed)
Physical Therapy Session Note  Patient Details  Name: Jade [name] MRN: [id number] Date of Birth: 1955-09-24  Today's Date: 05/02/2021 PT Individual Time: 8938-1017  + 5102-5852 PT Individual Time Calculation (min): 57 min + 26 min  Short Term Goals: Week 2:  PT Short Term Goal 1 (Week 2): Pt will perform supine<>sit with mod assist PT Short Term Goal 2 (Week 2): Pt will perform sit<>stands using LRAD with mod assist PT Short Term Goal 3 (Week 2): Pt will perform bed<>chair transfers using LRAD with mod assist PT Short Term Goal 4 (Week 2): Pt will ambulate at least 2ft using LRAD with +2 mod assist  Skilled Therapeutic Interventions/Progress Updates:     1st session: Pt supine in bed at start of session, agreeable to therapy and reports no pain. Donned pants at bed level with maxA via bridging technique - able to fully clear hips in bridge with RLE support but unable to pull pants over hips with her stronger LUE. MD arriving for morning rounds - discussed premorbid urinary incontinence and catheter - plan to remove with trial for medications to manage symptoms. Pt assisted supine to maxA with HOB flat and no use of bed features - poor trunk control and ability to compensate for her deficits. At EOB she required modA for sitting balance  due to posterior lean. Donned socks and shoes with totalA for time. Assisted to w/c via squat<>pivot transfer with maxA - very poor ability to forward weight shift and correct her posterior lean. Pt wheeled to main rehab gym for time and assisted to mat table in similar manner. At edge of mat, continues to show strong posterior bias with very limited trunk control to achieve upright after posterior LOB. Worked on visual and verbal cues for corrections, with mirror for aid. Worked on sit<>stands from mat table with R knee block, but required MAX assist for standing and significant difficulty achieving full upright due to strong posterior lean and pushing to her  paretic R side. Once she was able to achieve standing, worked on functional reaching and targeted reaching with horseshoes to basketball rim - able to achieve x5 sequences total. Pt assisted back to her w/c via squat<>pivot transfer with maxA and then returned to her room with totalA for time. Remained seated in w/c with safety belt alarm on, pillow supporting her RUE, all needs within reach.   2nd session: Pt seated in w/c at start of session - handoff of care from OT. Pt agreeable without reports of pain. Reports that she's concerned that her house is not "handicap accessible." Encouraged her to have her son and husband take home measurements for doorways, bed heights, etc. She also reports there's a lot of "clutter" around - educated her on home safety training with importance of clear paths to reduce falls risk. Pt wheeled to day room rehab gym for time. Assisted to Nustep via squat<>pivot with modA. Required setupA for Nustep and workload set to 6 (fading to 2). Required use of gait belt around distal femurs to secure R knee in neutral alignment due to weak hip's causing RLE abduction. She completed a total of 5 minutes with frequent rest breaks. Pt conversant throughout and required gentle redirection for task attenuation. Pt reports fatigue and difficulty completing tasks, disappointed that she doesn't feel as strong as the other day with her primary PT. Pt assisted back to her w/c via squat<>pivot with min/modA towards her R side. Required modA for repositioning in w/c and then transported  back to her room for time. Remained seated in w/c with safety belt alarm on, pillow under her RUE, all needs in reach.  Therapy Documentation Precautions:  Precautions Precautions: Fall Precaution Comments: RUE hemparesis with increased tone, diplopia Restrictions Weight Bearing Restrictions: No General:    Therapy/Group: Individual Therapy  Orrin Brigham 05/02/2021, 7:52 AM

## 2021-05-03 LAB — BASIC METABOLIC PANEL
Anion gap: 10 (ref 5–15)
BUN: 18 mg/dL (ref 8–23)
CO2: 24 mmol/L (ref 22–32)
Calcium: 9.7 mg/dL (ref 8.9–10.3)
Chloride: 105 mmol/L (ref 98–111)
Creatinine, Ser: 1.08 mg/dL — ABNORMAL HIGH (ref 0.44–1.00)
GFR, Estimated: 57 mL/min — ABNORMAL LOW (ref >60)
Glucose, Bld: 140 mg/dL — ABNORMAL HIGH (ref 70–99)
Potassium: 3.4 mmol/L — ABNORMAL LOW (ref 3.5–5.1)
Sodium: 139 mmol/L (ref 135–145)

## 2021-05-03 LAB — GLUCOSE, CAPILLARY
Glucose-Capillary: 139 mg/dL — ABNORMAL HIGH (ref 70–99)
Glucose-Capillary: 148 mg/dL — ABNORMAL HIGH (ref 70–99)
Glucose-Capillary: 138 mg/dL — ABNORMAL HIGH (ref 70–99)
Glucose-Capillary: 132 mg/dL — ABNORMAL HIGH (ref 70–99)

## 2021-05-03 MED ORDER — POTASSIUM CHLORIDE CRYS ER 20 MEQ PO TBCR
20.0000 meq | EXTENDED_RELEASE_TABLET | Freq: Two times a day (BID) | ORAL | Status: DC
  Administered 2021-05-03 – 2021-05-12 (×18): 20 meq via ORAL
  Filled 2021-05-03 (×19): qty 1

## 2021-05-03 NOTE — Progress Notes (Signed)
PROGRESS NOTE   Subjective/Complaints:  Incont void s/p foley removal   ROS: Denies CP, SOB, N/V/D  Objective:   No results found. Recent Labs    05/01/21 0558  WBC 7.6  HGB 12.8  HCT 38.1  PLT 342     Recent Labs    05/01/21 0558 05/03/21 0507  NA 139 139  K 3.2* 3.4*  CL 102 105  CO2 25 24  GLUCOSE 108* 140*  BUN 20 18  CREATININE 1.08* 1.08*  CALCIUM 10.2 9.7      Intake/Output Summary (Last 24 hours) at 05/03/2021 0827 Last data filed at 05/02/2021 1845 Gross per 24 hour  Intake 480 ml  Output --  Net 480 ml         Physical Exam: Vital Signs Blood pressure (!) 165/67, pulse 81, temperature 97.8 F (36.6 C), resp. rate 18, height 5\' 1"  (1.549 m), weight 76.2 kg, SpO2 99 %.  General: No acute distress Mood and affect are appropriate Heart: Regular rate and rhythm no rubs murmurs or extra sounds Lungs: Clear to auscultation, breathing unlabored, no rales or wheezes Abdomen: Positive bowel sounds, soft nontender to palpation, nondistended Extremities: No clubbing, cyanosis, or edema Skin: No evidence of breakdown, no evidence of rash   RUE: Elbow flexion/extension 2/5, stable R lower extremity: Ankle dorsiflexion 1/5 mAS: Right hand 2/4  Assessment/Plan: 1. Functional deficits which require 3+ hours per day of interdisciplinary therapy in a comprehensive inpatient rehab setting. Physiatrist is providing close team supervision and 24 hour management of active medical problems listed below. Physiatrist and rehab team continue to assess barriers to discharge/monitor patient progress toward functional and medical goals  Care Tool:  Bathing    Body parts bathed by patient: Chest, Abdomen, Right arm, Right upper leg, Left upper leg, Face, Left lower leg   Body parts bathed by helper: Right lower leg, Buttocks, Front perineal area, Left arm     Bathing assist Assist Level: Total Assistance -  Patient < 25%     Upper Body Dressing/Undressing Upper body dressing   What is the patient wearing?: Pull over shirt    Upper body assist Assist Level: Maximal Assistance - Patient 25 - 49%    Lower Body Dressing/Undressing Lower body dressing      What is the patient wearing?: Pants     Lower body assist Assist for lower body dressing: Total Assistance - Patient < 25%     Toileting Toileting    Toileting assist Assist for toileting: Dependent - Patient 0% (1 assist using Stedy)     Transfers Chair/bed transfer  Transfers assist     Chair/bed transfer assist level: Moderate Assistance - Patient 50 - 74% (squat pivot)     Locomotion Ambulation   Ambulation assist   Ambulation activity did not occur: Safety/medical concerns  Assist level: 2 helpers (+2 mod A) Assistive device: Hand held assist Max distance: 2ft   Walk 10 feet activity   Assist  Walk 10 feet activity did not occur: Safety/medical concerns  Assist level: 2 helpers (+2 mod A) Assistive device: Hand held assist   Walk 50 feet activity   Assist Walk 50 feet with  2 turns activity did not occur: Safety/medical concerns         Walk 150 feet activity   Assist Walk 150 feet activity did not occur: Safety/medical concerns         Walk 10 feet on uneven surface  activity   Assist Walk 10 feet on uneven surfaces activity did not occur: Safety/medical concerns         Wheelchair     Assist Is the patient using a wheelchair?: Yes      Wheelchair assist level: Dependent - Patient 0% Max wheelchair distance: 50    Wheelchair 50 feet with 2 turns activity    Assist        Assist Level: Dependent - Patient 0%   Wheelchair 150 feet activity     Assist      Assist Level: Dependent - Patient 0%   Blood pressure (!) 165/67, pulse 81, temperature 97.8 F (36.6 C), resp. rate 18, height 5\' 1"  (1.549 m), weight 76.2 kg, SpO2 99 %.  Medical Problem List  and Plan: 1.  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  Antithrombotics: -DVT/anticoagulation: Subcutaneous heparin   8/25- changed to Lovenox 40 mg qday since CrCl is >40             -antiplatelet therapy: Aspirin 325 mg daily and Plavix 75 mg daily x3 months then Plavix alone 3. Pain: Continue Neurontin 200 mg nightly, Flexeril as needed  Voltaren gel ordered to left knee on 8/31  Controlled on 9/7 4. Mood: Provide emotional support             -antipsychotic agents: N/A 5. Neuropsych: This patient is capable of making decisions on her own behalf. 6. Skin/Wound Care: Routine skin checks  8/30-  has a scab for "years' on front of scalp- needs Derm after d/c  7. Fluids/Electrolytes/Nutrition: Routine and outs with follow-up chemistries ordered 8.  Symptomatic left ICA stenosis.  Status post left transcarotid artery revascularization 04/06/2021.  Vascular surgery following 9.  Hypertension.  Norvasc 10 mg daily, Increase HCTZ to 25 mg daily.  Monitor with increased mobility  9/4: monitor for effects of yesterday's increase in HCTZ Vitals:   05/02/21 1939 05/03/21 0558  BP: 127/73 (!) 165/67  Pulse: 82 81  Resp: 17 18  Temp: 99.1 F (37.3 C) 97.8 F (36.6 C)  SpO2: 100% 99%  Labile cont to monitor on current dose meds  10.  Diabetes mellitus with hyperglycemia.  Hemoglobin A1c 6.5.  SSI.             -monitor CBG's AC/HS CBG (last 3)  Recent Labs    05/02/21 1647 05/02/21 2054 05/03/21 0627  GLUCAP 189* 116* 139*   Controlled  11.  Hyperlipidemia.  Continue Lipitor 12.  GERD.  Protonix 13.  CKD stage III.  Baseline creatinine 1.18-1.34.     Creatinine 1.17 on 8/29, stable  at 1.08 on 9/5 14.  Obesity.  BMI 31.74.  Dietary follow-up 15.  COPD.  Quit smoking 33 years ago.  Check oxygen saturations every shift              -  oxygen per [place] at 2L currently  16. Urinary retention with painful caths-continue foley   -voiding trial this week once mobility improves?   -flomax on board   9/7  voiding trial in progress no retention, chronic urinary incont, had BSC next to bed PTA, start toileting program , cont myrbetriq  17.  Left toe bruising in sensate foot, pt does not recall an injury , exam unremarkable check xray  8/24- has nondisplaced acute fracture of L 3rd middle phalanx- will tape to 2nd toe to help.  18. Constipation  Improving 19. Spasticity  8/25- changed Zanaflex to 6pm nightly-  ` Dantrolene increased to 50 mg QHS/6pm to see if can help spasticity- cannot tolerate Zanaflex during day.   Dantrolene 25 mg QHS/6pm last night- no side effects- will need to be on at least 3-4 days before increasing to 50 mg QHS.   9/1- dantrolene is now 50 mg QHS 20. Bowel incontinence-   9/1- pt educated that this is normal with stroke pts- will hopefully improve.  21.  hypoK+ likely due to HCTZ addition will increase KCL to BID - wants a "small K+ tablet", has taken K+ supplement at home "99mg " KCL is ~ 400mg  K gluconate, discussed dietary options to increase K+ but most are high in glucose LOS: 20 days A FACE TO FACE EVALUATION WAS PERFORMED  05/03/2021, 8:27 AM

## 2021-05-03 NOTE — Progress Notes (Signed)
Orthopedic Tech Progress Note Patient Details:  Jade [name] 04-27-1956 [id number]   Called in order to [facility] for an AFO CONSULT    Patient ID: Jade [name], female   DOB: 11/12/1955, 65 y.o.   MRN: [id number]  Donald Pore 05/03/2021, 3:07 PM

## 2021-05-03 NOTE — Patient Care Conference (Signed)
Inpatient RehabilitationTeam Conference and Plan of Care Update Date: 05/03/2021   Time: 10:46 AM    Patient Name: Jade [name] [facility]      Medical Record Number: [id number]  Date of Birth: 15-May-1956 Sex: Female         Room/Bed: 4W24C/4W24C-01 Payor Info: Payor: [employer]vertising copy[employer] MEDICARE / Plan: [employer] MEDICARE / Product Type: *No Product type* /    Admit Date/Time:  04/13/2021  4:00 PM  Primary Diagnosis:  CVA (cerebral vascular accident) [facility])  Hospital Problems: Principal Problem:   CVA (cerebral vascular accident) (HCC) Active Problems:   Spastic hemiparesis (HCC)   Stage 3a chronic kidney disease (HCC)   Controlled type 2 diabetes mellitus with hyperglycemia, without long-term current use of insulin (HCC)   Labile blood glucose   Essential hypertension    Expected Discharge Date: Expected Discharge Date: 05/12/21  Team Members Present: Physician leading conference: Dr. Claudette Laws Social Worker Present: Lavera Guise, BSW Nurse Present: Chana Bode, RN PT Present: Casimiro Needle, PT OT Present: Perrin Maltese, OT SLP Present: Eilene Ghazi, SLP PPS Coordinator present : Fae Pippin, SLP     Current Status/Progress Goal Weekly Team Focus  Bowel/Bladder             Swallow/Nutrition/ Hydration             ADL's   Min asssit for UB bathing with mod assist for UB dressing.  Max assist for LB bathing and dressing sit to stand.  Still with increased pushing to the right in standing and with transitional movements.  Brunnstrum stage III in the right arm and hand with increased flexor tone in the digits.  Transfers mod assist squat pivot with max assist stand pivot  min assist overall  selfcare retraining, transfer training, neuromuscular re-education, balance retraining, therapeutic activites, pt/family education   Mobility   supine<>sit mod assist, sit<>stands using L UE support mod assist, mod assist squat pivot transfers, overground gait training using  litegait for safety but no true body weight support up to 153ft with mod assist for R LE management vs 62ft overground with +2 mod assist for balance and R LE gait mechanics - able to advance R LE 50-75% and requires min assist to guard/block R knee during stance  min assist overall  transfer training, midline orientation, R LE NMR, high intensity gait training, standing balance, pt education, activity tolerance, trunk control   Communication             Safety/Cognition/ Behavioral Observations            Pain             Skin               Discharge Planning:  Pt discharging home with spouse and son. Spouse works during the day, unable to provide 24/7. Son unemployed able to provide 24/7   Team Discussion: Foley removed 05/02/21; patient voiding on her own with low PVRs. Hx of chronic overactive bladder issues and meds on board for management. Neuropathy in feet and right hemiparesis post CVA ; hand stretches recommended. BP labile and monitored. Hypokalemia supplemented per MD.  Patient on target to meet rehab goals: yes, currently min assist for upper body bathing and mod assist for upper body dressing and max assist for lower body care. Completes bed mobility with mod assist.  Sit - stand with max assist due to pushing to the right. Squat pivot transfers with mod assist with left UE support. Able  to ambulate up to 160' with +2 mod assist. Goals for discharge set for min assist overall with mod assist for transfers and gait.  *See Care Plan and progress notes for long and short-term goals.   Revisions to Treatment Plan:  Using light gait training and E-stim AFO consult Seating consult for standard vs custom wheelchair SLP has signed off Downgraded PT/OT goals  Teaching Needs: Safety, transfers, toileting, secondary risk management, medications, etc  Current Barriers to Discharge: Decreased caregiver support and Home enviroment access/layout  Possible Resolutions to  Barriers: Family education with son     Medical Summary Current Status: BPs variable, urinary freq, foley recently removed with voiding trial ongoing  Barriers to Discharge: Medical stability   Possible Resolutions to Barriers/Weekly Focus: adjust BP meds, cont current spsticity meds, cont toileting program , adjust meds for OAB   Continued Need for Acute Rehabilitation Level of Care: The patient requires daily medical management by a physician with specialized training in physical medicine and rehabilitation for the following reasons: Direction of a multidisciplinary physical rehabilitation program to maximize functional independence : Yes Medical management of patient stability for increased activity during participation in an intensive rehabilitation regime.: Yes Analysis of laboratory values and/or radiology reports with any subsequent need for medication adjustment and/or medical intervention. : Yes   I attest that I was present, lead the team conference, and concur with the assessment and plan of the team.   Chana Bode B 05/03/2021, 11:23 AM

## 2021-05-03 NOTE — Plan of Care (Signed)
  Problem: RH Balance Goal: LTG Patient will maintain dynamic standing balance (PT) Description: LTG:  Patient will maintain dynamic standing balance with assistance during mobility activities (PT) Flowsheets (Taken 05/03/2021 1852) LTG: Pt will maintain dynamic standing balance during mobility activities with:: (downgraded based on pt's progress) Moderate Assistance - Patient 50 - 74% Note: downgraded based on pt's progress   Problem: RH Ambulation Goal: LTG Patient will ambulate in controlled environment (PT) Description: LTG: Patient will ambulate in a controlled environment, # of feet with assistance (PT). Flowsheets (Taken 05/03/2021 1852) LTG: Pt will ambulate in controlled environ  assist needed:: (downgraded based on pt's progress) Moderate Assistance - Patient 50 - 74% LTG: Ambulation distance in controlled environment: 58ft using LRAD Note: downgraded based on pt's progress Goal: LTG Patient will ambulate in home environment (PT) Description: LTG: Patient will ambulate in home environment, # of feet with assistance (PT). Flowsheets (Taken 05/03/2021 1852) LTG: Pt will ambulate in home environ  assist needed:: (downgraded based on pt's progress) Moderate Assistance - Patient 50 - 74% LTG: Ambulation distance in home environment: 47ft using LRAD Note: downgraded based on pt's progress

## 2021-05-03 NOTE — Progress Notes (Signed)
Occupational Therapy Session Note  Patient Details  Name: Jade [name] MRN: [id number] Date of Birth: [birth year]  Today's Date: 05/03/2021 OT Individual Time: 2353-6144 OT Individual Time Calculation (min): 58 min    Short Term Goals: Week 3:  OT Short Term Goal 1 (Week 3): Pt will maintain dynamic sitting balance with min assist overall during completion of selfcare tasks. OT Short Term Goal 2 (Week 3): Pt will complete sit to stand for LB dressing tasks with mod assist 3 consecutive sessions. OT Short Term Goal 3 (Week 3): Pt will complete squat pivot transfers to the drop arm commode with min assist.  Skilled Therapeutic Interventions/Progress Updates:    Session 1: (3154-0086)  Pt in bed to start with agreement for working on toilet transfer and toileting.  She was able to transfer to sitting from supine with mod assist and then the [facility] was used for transfer to the toilet.  She was able to pull up to standing with min assist but needs mod assist for standing balance in the Stedy secondary to pushing to the right.  She was able to complete clothing management and toilet hygiene at max assist level with successful completion of BM.  Transferred out to the sink for washing front and back peri area with max assist sit to stand.  She then worked on donning new brief and pants at that same level.  Total assist for TEDs and shoes to complete session.  She was left with safety belt in place and call button and phone in reach.   Session 2: (1403-1500)  Pt worked on standing balance during session in front of the high low mat with plinth placed on top of it for supporting her UEs.  Hand paddle was placed on the RUE to help with maintaining digit extension.  Mod assist for sit to stand with overall max assist to maintain standing greater than 1 min secondary to motor impersistence in the right hip and knee.  Increased head and trunk flexion as she fatigued as well with RUE moving into a flexor  synergy pattern toward midline.  Completed several intervals with incorporation of LUE reaching a few times to target as well as stepping forward and backwards with her LEs.  Mod assist was needed for placement of the RLE.  Transitioned to work on active movement of the RUE for elbow extension in sitting with placement of the NMES to the digit extensors of the RUE.  She was instructed to work on pushing a flat board forward that therapist was holding along with activation of the wrist and digit extensors.  She was able to complete with consistency.  She tolerated 10 mins of stimulation without any adverse reactions at the below settings.  Returned to the room at the end of the session via wheelchair with call button and phone left in reach as well as safety belt in place.    Therapy Documentation Precautions:  Precautions Precautions: Fall Precaution Comments: RUE hemparesis with increased tone, diplopia Restrictions Weight Bearing Restrictions: No  Pain: Pain Assessment Pain Scale: Faces Pain Score: 0-No pain ADL: See Care Tool Section for some details of mobility and selfcare  Therapy/Group: Individual Therapy  Drayke Grabel OTR/L 05/03/2021, 10:40 AM

## 2021-05-03 NOTE — Progress Notes (Signed)
Patient ID: Jade [name], female   DOB: 12/06/1955, 65 y.o.   MRN: [id number] Team Conference Report to Patient/Family  Team Conference discussion was reviewed with the patient and caregiver, including goals, any changes in plan of care and target discharge date.  Patient and caregiver express understanding and are in agreement.  The patient has a target discharge date of 05/12/21.  SW met with pt. Provided team conference updates. SW asked pt about family coming in for family edu. Pt reports she would need to speak with her spouse and son. Spouse may not be willing to participate and son will need to find transportation. SW will follow up with pt. Dyanne Iha 05/03/2021, 12:57 PM

## 2021-05-03 NOTE — Progress Notes (Signed)
Physical Therapy Weekly Progress Note  Patient Details  Name: Jade [name] MRN: [id number] Date of Birth: [birth year]  Beginning of progress report period: April 25, 2021 End of progress report period: May 03, 2021  Today's Date: 05/03/2021 PT Individual Time: 1165-7903 PT Individual Time Calculation (min): 85 min   Patient has met 4 of 4 short term goals.  Jade [name] is progressing well with therapy demonstrating increasing independence with functional mobility. She is performing supine<>sit with primarily mod assist but can be up to max assist with fatigue or first thing in the morning, sit<>stands and squat pivot transfers with mod assist, as well as ambulating up to 125ft with L HHA and +2 mod assist for balance and R LE gait mechanics. She is able to advance R LE ~50-75% of the way during swing phase at least 30% of the time and requires min/mod assist to facilitate R hip/knee extension during stance. Planning for AFO consultation on Tuesday 9/13 to determine if pt will benefit from AFO to further increase independence with gait. Will soon determine if custom wheelchair consultation will be necessary as patient will D/C home at wheelchair level for functional mobility requiring family assistance for transfers. Jade [name] will benefit from continued CIR level therapies to further progress her independence with functional mobility prior to D/C home on 9/16.   Patient continues to demonstrate the following deficits muscle weakness and muscle paralysis, decreased cardiorespiratoy endurance, impaired timing and sequencing, abnormal tone, and unbalanced muscle activation, decreased visual acuity and decreased visual motor skills, and decreased sitting balance, decreased standing balance, decreased postural control, hemiplegia, and decreased balance strategies and therefore will continue to benefit from skilled PT intervention to increase functional independence with mobility.  Patient  not progressing toward long term goals.  See goal revision..  Continue plan of care.  PT Short Term Goals Week 2:  PT Short Term Goal 1 (Week 2): Pt will perform supine<>sit with mod assist PT Short Term Goal 1 - Progress (Week 2): Met PT Short Term Goal 2 (Week 2): Pt will perform sit<>stands using LRAD with mod assist PT Short Term Goal 2 - Progress (Week 2): Met PT Short Term Goal 3 (Week 2): Pt will perform bed<>chair transfers using LRAD with mod assist PT Short Term Goal 3 - Progress (Week 2): Met PT Short Term Goal 4 (Week 2): Pt will ambulate at least 31ft using LRAD with +2 mod assist PT Short Term Goal 4 - Progress (Week 2): Met Week 3:  PT Short Term Goal 1 (Week 3): = to LTGs based on ELOS  Skilled Therapeutic Interventions/Progress Updates:  Ambulation/gait training;Balance/vestibular training;Cognitive remediation/compensation;Community reintegration;Disease Editor, commissioning;Discharge planning;Functional mobility training;Neuromuscular re-education;Functional electrical stimulation;Pain management;Patient/family education;Splinting/orthotics;Skin care/wound management;Psychosocial support;Stair training;Therapeutic Activities;Therapeutic Exercise;Visual/perceptual remediation/compensation;UE/LE Coordination activities;UE/LE Strength taining/ROM;Wheelchair propulsion/positioning   Pt received sitting in w/c and agreeable to therapy session. Reports she is having a "blah" day somewhere between a good day and a bad day. Discussed D/C planning with AFO consultation scheduled for next Tuesday 9/13 at 1PM to - discussed that she will likely D/C home at wheelchair level for increased independence and may progress to performing short distance ambulation with family's assistance if that is safe. Pt in agreement with this plan. Transported to/from gym in w/c for time management and energy conservation.   Donned R LE [place] Walkon PLS AFO. Sit>stands  w/c>L HHA with cuing to scoot hips forward (pt able to recall this from OT sessions) and lean trunk forward and maintain anterior weight  shift while rising to stand - pt able to perform sit>stands with light mod progressing to min assist today!   Gait training 75ft, 3ft, and 73ft using +2 L hand held min assist with mod assist from therapist for balance and R LE gait mechanics - continues to require max cuing to shift weight onto L stance limb to allow improved R LE swing phase advancement and when doing so is able to advance R LE ~50-75% of the way at least 30% of the time - continues to require min/mod assist for R knee guarding/block during stance phase while facilitating increased hip extension and anterior translation of pelvis over R stance limb, cuing for upright posture with trunk extension.   Transported back to room in w/c. Pt's husband and son arrived.  Discussed D/C plan regarding the following: - need for family education next Tuesday and Wednesday in preparation for D/C home on Friday 9/16 - DME recommendations with custom wheelchair vs standard wheelchair - follow-up therapy upon D/C  - home entry with pt having 1 4-6" height step-up into the house and another 4-6" height step-up into the kitchen - pt's son considering building a ramp otherwise will plan to teach him how to bump her up the step - provided pt's son with home measurement sheet to help with D/C planning - asked him to place it on the bathroom door once the measurements have been taken  Pt left seated in w/c with needs in reach, R UE therapeutically positioned on pillows, seat belt alarm on, and family present.  Therapy Documentation Precautions:  Precautions Precautions: Fall Precaution Comments: RUE hemparesis with increased tone, diplopia Restrictions Weight Bearing Restrictions: No   Pain:  Continues to have chronic L knee pain but also has acute R hip pain that pt associates with lying in the bed for 12+hrs  in the same position - notified nurse and Voltaren gel applied. Therapist utilized ACE wrap on L knee throughout session for pain management.    Therapy/Group: Individual Therapy  Tawana Scale , PT, DPT, NCS, CSRS 05/03/2021, 3:12 PM

## 2021-05-03 NOTE — Progress Notes (Signed)
Pt received Lovenox injection this morning at 0850. Therapy noticed the area was bleeding and placed a bandage over the area. Around 1300 the area had bled through the bandage and soaked a good portion of the front of the pts brief. The area was cleaned and a Tegaderm with guaze was placed after consulting with charge nurse Clydie Braun. The area on lower abdomen continued to bleed and I consulted with the PA, Dan. He stated to place more pressure on the area and wrap with an Ace bandage, and guaze to be placed at the site. I wrapped 2 ace bandages around the pts lower abdominal area with guaze placed on the site on the inside of the ace bandages. Will continue to monitor.  Loleta Dicker LPN

## 2021-05-04 LAB — GLUCOSE, CAPILLARY
Glucose-Capillary: 124 mg/dL — ABNORMAL HIGH (ref 70–99)
Glucose-Capillary: 167 mg/dL — ABNORMAL HIGH (ref 70–99)
Glucose-Capillary: 85 mg/dL (ref 70–99)
Glucose-Capillary: 147 mg/dL — ABNORMAL HIGH (ref 70–99)

## 2021-05-04 NOTE — Progress Notes (Signed)
Recreational Therapy Session Note  Patient Details  Name: KAELEY VINJE MRN: [id number] Date of Birth: 30-Apr-1956 Today's Date: 05/04/2021  Pain: no c/o Skilled Therapeutic Interventions/Progress Updates: Pt participated in stress management group today with an emphasis on stress exploration.  Factors that contribute to stress, factors that protect against stress, coping strategies including deep breathing, progressive muscle relaxation, challenging irrational thoughts and imagery were discussed.  Pt actively participated in discussion & handout was provided.  Encouraged pt to discuss the handout with husband as well.  Therapy/Group: Group Therapy  Danielle Mink 05/04/2021, 4:12 PM

## 2021-05-04 NOTE — Progress Notes (Signed)
Physical Therapy Session Note  Patient Details  Name: Jade [name] MRN: [id number] Date of Birth: [birth year]  Today's Date: 05/04/2021 PT Individual Time: 5003-7048 PT Individual Time Calculation (min): 55 min   Short Term Goals: Week 3:  PT Short Term Goal 1 (Week 3): = to LTGs based on ELOS  Skilled Therapeutic Interventions/Progress Updates:    Patient in w/c in room.  Reports had trouble with stopping bleeding from Lovenox and had trouble all night.  Prefers not to use the lite gait due to squeezing from belt.  Patient transported to hallway by rehab gym.  Sit to stand to rail in hallway max A.  Patient ambulated with Ace wrap on L knee and R foot for DF assist x about 15' with pain limiting and assist for R knee stability in stance noting some crepitus in stance.  Patient transferred to mat mod A (+2 for safety) squat pivot.  Performed sit to supine mod A for R LE and trunk.  Patient performed supine therex including bridging w/ 5 sec hold x 10 reps placing wedge under shoulders to improve comfort due to back pain.  Performed hooklying marching x 15 w/ assist on R, hip adductor squeezes w/ ball between knees 5 sec hold x 15, lateral trunk rotation with 10 sec hold for stretch to sides and min A to initiate moving legs to L, SAQ x 12 and bridging with legs over bolster x 6.  Patient rolled to R with cues and min A and side to sit mod A.  Performed squat pivot back to w/c min to mod A.  Patient assisted in w/c to room.  Left sitting with call bell and needs in reach, chair alarm active.   Therapy Documentation Precautions:  Precautions Precautions: Fall Precaution Comments: RUE hemparesis with increased tone, diplopia Restrictions Weight Bearing Restrictions: No  Pain: Pain Assessment Pain Score: 5  Pain Type: Chronic pain Pain Location: Generalized Pain Descriptors / Indicators: Aching;Sore Pain Onset: With Activity Pain Intervention(s): Rest;Emotional  support    Therapy/Group: Individual Therapy  Elray Mcgregor [facility], [place] 05/04/2021, 12:22 PM

## 2021-05-04 NOTE — Progress Notes (Signed)
Occupational Therapy Session Note  Patient Details  Name: Jade [name] MRN: [id number] Date of Birth: [birth year]  Today's Date: 05/04/2021 OT Group Time: 1330-1430 OT Group Time Calculation (min): 60 min   Short Term Goals: Week 3:  OT Short Term Goal 1 (Week 3): Pt will maintain dynamic sitting balance with min assist overall during completion of selfcare tasks. OT Short Term Goal 2 (Week 3): Pt will complete sit to stand for LB dressing tasks with mod assist 3 consecutive sessions. OT Short Term Goal 3 (Week 3): Pt will complete squat pivot transfers to the drop arm commode with min assist.  Skilled Therapeutic Interventions/Progress Updates:  Pt participated in group session with a focus on stress mgmt, education on healthy coping strategies, and social interaction. Focus of session on providing coping strategies to manage new current level of function as a result of new diagnosis.  Session focus on breaking down stressors into "daily hassles," "major life stressors" and "life circumstances" in an effort to allow pts to chunk their stressors into groups.  Pt actively sharing stressors and contributing to group conversation. Offered education on factors that protect [place] against stress such as "daily uplifts," "healthy coping strategies" and "protective factors." Encouraged all group members to make an effort to actively recall one event from their day that was a daily uplift in an effort to protect their mindset from stressors. Issued pt handouts on healthy coping strategies to implement into routine. Pt transported back to room by RT.  Therapy Documentation Precautions:  Precautions Precautions: Fall Precaution Comments: RUE hemparesis with increased tone, diplopia Restrictions Weight Bearing Restrictions: No  Pain: Pt reports no pain during group session.    Therapy/Group: Group Therapy  Barron Schmid 05/04/2021, 3:46 PM

## 2021-05-04 NOTE — Progress Notes (Signed)
Physical Therapy Session Note  Patient Details  Name: Jade [name] MRN: [id number] Date of Birth: 05-26-1956  Today's Date: 05/04/2021 PT Individual Time: 0922-1006 and 1420-1506 PT Individual Time Calculation (min): 44 min and 46 min   Short Term Goals: Week 3:  PT Short Term Goal 1 (Week 3): = to LTGs based on ELOS  Skilled Therapeutic Interventions/Progress Updates:    Session 1: Pt received supine in bed and agreeable to therapy session. Pt reports that she wasn't able to get to sleep until midnight due to the bleeding from her abdomen (still with dressing in place this AM). Supine>sitting L EOB, HOB flat but using bedrail, mod assist for trunk upright and for scooting towards EOB - cuing and allowing increased time for pt independence. Therapist removed kinesiotape from pt's R shoulder. Sitting EOB with L UE support on bedrail and intermittent min assist for trunk control due to posterior lean/LOB while donning TED hose, clean pants, and shoes total assist. Sit>stand EOB>L UE support on bedrail with mod assist for balance due to posterior lean - requires repeated attempts prior to achieving upright without posterior LOB as pt starting to extend trunk too soon when coming to rise, requires repeated cuing to maintain anterior trunk lean/weight shift. Standing with mod assist for balance while pulling pants up over hips total assist. R squat pivot transfer EOB>w/c mod assist for lifting/pivoting hips and continued cuing for head/hips relationship. Pt left seated in w/c with needs in reach, R UE therapeutically positioned on pillows, and seat belt alarm on.  Session 2: Pt received sitting in w/c and agreeable to therapy session but reports need to use bathroom then quickly incontinent of bladder. Utilized stedy to complete toileting for cleanliness. Sit>stand w/c>stedy min assist for lifting to stand and cuing for increased hip/knee extension and anterior weight shift. Stedy transport in/out of  bathroom.Total assist LB clothing management and peri-care for cleanliness and time management. Sit<>stands to/from stedy seat with CGA. Therapist retrieved new wheelchair cushion as previous one was soiled. Sit>stand w/c>L UE support on sink with min/mod assist for balance and blocking/guarding R knee with cuing for midline orientation as pt either having anterior or posterior LOB. At end of session, pt left seated in w/c with needs in reach, R UE therapeutically positioned on pillows, and seat belt alarm on.  Therapy Documentation Precautions:  Precautions Precautions: Fall Precaution Comments: RUE hemparesis with increased tone, diplopia Restrictions Weight Bearing Restrictions: No   Pain:  Session 1: Continues to have chronic L knee and L shoulder pain with acute on chronic pain in R knee and R shoulder - Voltaren gel for pain management.   Session 2: Continues to have chronic L knee pain - ACE wrap still donned from prior PT session and remained in place until the end of this session for pain management.   Therapy/Group: Individual Therapy  Ginny Forth , PT, DPT, NCS, CSRS 05/04/2021, 7:56 AM

## 2021-05-04 NOTE — Progress Notes (Signed)
PROGRESS NOTE   Subjective/Complaints: Nursing and therapy notes reviewed- she has been having bleeding from lovenox injection site and ambulating >150 feet- will d/c lovenox.  She otherwise has no complaints  ROS: Denies CP, SOB, N/V/D, + bleeding from lovenox injection site  Objective:   No results found. No results for input(s): WBC, HGB, HCT, PLT in the last 72 hours.  Recent Labs    05/03/21 0507  NA 139  K 3.4*  CL 105  CO2 24  GLUCOSE 140*  BUN 18  CREATININE 1.08*  CALCIUM 9.7     Intake/Output Summary (Last 24 hours) at 05/04/2021 1222 Last data filed at 05/04/2021 0830 Gross per 24 hour  Intake 700 ml  Output --  Net 700 ml        Physical Exam: Vital Signs Blood pressure (!) 140/7, pulse 80, temperature 98.2 F (36.8 C), temperature source Oral, resp. rate 17, height 5\' 1"  (1.549 m), weight 76.2 kg, SpO2 99 %.  Gen: no distress, normal appearing HEENT: oral mucosa pink and moist, NCAT Cardio: Reg rate Chest: normal effort, normal rate of breathing Abd: soft, non-distended Ext: no edema Psych: pleasant, normal affect Skin: intact RUE: Elbow flexion/extension 2/5, stable R lower extremity: Ankle dorsiflexion 1/5 mAS: Right hand 2/4  Assessment/Plan: 1. Functional deficits which require 3+ hours per day of interdisciplinary therapy in a comprehensive inpatient rehab setting. Physiatrist is providing close team supervision and 24 hour management of active medical problems listed below. Physiatrist and rehab team continue to assess barriers to discharge/monitor patient progress toward functional and medical goals  Care Tool:  Bathing    Body parts bathed by patient: Chest, Abdomen, Right arm, Right upper leg, Left upper leg, Face, Left lower leg   Body parts bathed by helper: Right lower leg, Buttocks, Front perineal area, Left arm     Bathing assist Assist Level: Total Assistance - Patient  < 25%     Upper Body Dressing/Undressing Upper body dressing   What is the patient wearing?: Pull over shirt    Upper body assist Assist Level: Moderate Assistance - Patient 50 - 74%    Lower Body Dressing/Undressing Lower body dressing      What is the patient wearing?: Pants, Incontinence brief     Lower body assist Assist for lower body dressing: Maximal Assistance - Patient 25 - 49%     Toileting Toileting    Toileting assist Assist for toileting: Maximal Assistance - Patient 25 - 49%     Transfers Chair/bed transfer  Transfers assist     Chair/bed transfer assist level: Maximal Assistance - Patient 25 - 49% Chair/bed transfer assistive device: Armrests   Locomotion Ambulation   Ambulation assist   Ambulation activity did not occur: Safety/medical concerns  Assist level: 2 helpers (+2 mod A) Assistive device: Hand held assist Max distance: 33ft   Walk 10 feet activity   Assist  Walk 10 feet activity did not occur: Safety/medical concerns  Assist level: 2 helpers (+2 mod A) Assistive device: Hand held assist   Walk 50 feet activity   Assist Walk 50 feet with 2 turns activity did not occur: Safety/medical concerns  Walk 150 feet activity   Assist Walk 150 feet activity did not occur: Safety/medical concerns         Walk 10 feet on uneven surface  activity   Assist Walk 10 feet on uneven surfaces activity did not occur: Safety/medical concerns         Wheelchair     Assist Is the patient using a wheelchair?: Yes      Wheelchair assist level: Dependent - Patient 0% Max wheelchair distance: 50    Wheelchair 50 feet with 2 turns activity    Assist        Assist Level: Dependent - Patient 0%   Wheelchair 150 feet activity     Assist      Assist Level: Dependent - Patient 0%   Blood pressure (!) 140/7, pulse 80, temperature 98.2 F (36.8 C), temperature source Oral, resp. rate 17, height 5\' 1"   (1.549 m), weight 76.2 kg, SpO2 99 %.  Medical Problem List and Plan: 1.  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  Impaired mobility; d/c Lovenox given bleeding from injection site and ambulation>150 feet             -antiplatelet therapy: Aspirin 325 mg daily and Plavix 75 mg daily x3 months then Plavix alone 3. Pain: Continue Neurontin 200 mg nightly, Flexeril as needed  Voltaren gel ordered to left knee on 8/31  Controlled on 9/8 4. Mood: Provide emotional support             -antipsychotic agents: N/A 5. Neuropsych: This patient is capable of making decisions on her own behalf. 6. Skin/Wound Care: Routine skin checks  8/30-  has a scab for "years' on front of scalp- needs Derm after d/c  7. Fluids/Electrolytes/Nutrition: Routine and outs with follow-up chemistries ordered 8.  Symptomatic left ICA stenosis.  Status post left transcarotid artery revascularization 04/06/2021.  Vascular surgery following 9.  Hypertension.  Norvasc 10 mg daily, Increase HCTZ to 25 mg daily.  Monitor with increased mobility  9/4: monitor for effects of yesterday's increase in HCTZ Vitals:   05/04/21 0423 05/04/21 0752  BP: (!) 142/70 (!) 140/7  Pulse: 80   Resp: 17   Temp: 98.2 F (36.8 C)   SpO2: 99%   Labile cont to monitor on current dose meds  10.  Diabetes mellitus with hyperglycemia.  Hemoglobin A1c 6.5.  SSI.             -monitor CBG's AC/HS CBG (last 3)  Recent Labs    05/03/21 2047 05/04/21 0555 05/04/21 1133  GLUCAP 132* 124* 167*  Controlled  11.  Hyperlipidemia.  Continue Lipitor 12.  GERD.  Protonix 13.  CKD stage III.  Baseline creatinine 1.18-1.34.     Creatinine 1.17 on 8/29, stable at 1.08  on 9/5 14.  Obesity.  BMI 31.74.  Dietary follow-up 15.  COPD.  Quit smoking 33 years ago.  Check oxygen saturations every shift              -oxygen per [place] at 2L currently  16. Urinary retention with painful caths-continue foley   -voiding trial this week once mobility improves?   -  flomax on board   9/7  voiding trial in progress no retention, chronic urinary incont, had BSC next to bed PTA, start toileting program , cont [id number]  17.  Left toe bruising in sensate foot, pt does not recall an injury , exam unremarkable check xray  8/24- has nondisplaced acute fracture of L 3rd middle phalanx- will tape to 2nd toe to help.  18. Constipation  Improving 19. Spasticity  8/25- changed Zanaflex to 6pm nightly-  ` Dantrolene increased to 50 mg QHS/6pm to see if can help spasticity- cannot tolerate Zanaflex during day.   Dantrolene 25 mg QHS/6pm last night- no side effects- will need to be on at least 3-4 days before increasing to 50 mg QHS.   9/1- dantrolene is now 50 mg QHS 20. Bowel incontinence-   9/1- pt educated that this is normal with stroke pts- will hopefully improve.  21.  hypoK+ likely due to HCTZ addition will increase KCL to BID - wants a "small K+ tablet", has taken K+ supplement at home "99mg " KCL is ~ 400mg  K gluconate, discussed dietary options to increase K+ but most are high in glucose LOS: 21 days A FACE TO FACE EVALUATION WAS PERFORMED  P Vearl Allbaugh 05/04/2021, 12:22 PM

## 2021-05-05 LAB — GLUCOSE, CAPILLARY
Glucose-Capillary: 113 mg/dL — ABNORMAL HIGH (ref 70–99)
Glucose-Capillary: 158 mg/dL — ABNORMAL HIGH (ref 70–99)
Glucose-Capillary: 138 mg/dL — ABNORMAL HIGH (ref 70–99)
Glucose-Capillary: 179 mg/dL — ABNORMAL HIGH (ref 70–99)

## 2021-05-05 MED ORDER — HYDROCHLOROTHIAZIDE 12.5 MG PO CAPS
37.5000 mg | ORAL_CAPSULE | Freq: Every day | ORAL | Status: DC
  Administered 2021-05-06 – 2021-05-08 (×3): 37.5 mg via ORAL
  Filled 2021-05-05 (×3): qty 3

## 2021-05-05 NOTE — Progress Notes (Signed)
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  ROS: Denies CP, SOB, N/V/D, bleeding from lovenox injection site has stopped  Objective:   No results found. No results for input(s): WBC, HGB, HCT, PLT in the last 72 hours.  Recent Labs    05/03/21 0507  NA 139  K 3.4*  CL 105  CO2 24  GLUCOSE 140*  BUN 18  CREATININE 1.08*  CALCIUM 9.7     Intake/Output Summary (Last 24 hours) at 05/05/2021 1122 Last data filed at 05/05/2021 0700 Gross per 24 hour  Intake 800 ml  Output --  Net 800 ml        Physical Exam: Vital Signs Blood pressure (!) 152/69, pulse 80, temperature 98 F (36.7 C), resp. rate 18, height 5\' 1"  (1.549 m), weight 76.2 kg, SpO2 98 %. Gen: no distress, normal appearing HEENT: oral mucosa pink and moist, NCAT Cardio: Reg rate Chest: normal effort, normal rate of breathing Abd: soft, non-distended Ext: no edema Psych: pleasant, normal affect Skin: intact Musculoskeletal:  RUE: Elbow flexion/extension 2/5, stable R lower extremity: Ankle dorsiflexion 1/5 mAS: Right hand 2/4  Assessment/Plan: 1. Functional deficits which require 3+ hours per day of interdisciplinary therapy in a comprehensive inpatient rehab setting. Physiatrist is providing close team supervision and 24 hour management of active medical problems listed below. Physiatrist and rehab team continue to assess barriers to discharge/monitor patient progress toward functional and medical goals  Care Tool:  Bathing    Body parts bathed by patient: Chest, Abdomen, Right arm, Right upper leg, Left upper leg, Face, Left lower leg   Body parts bathed by helper: Right lower leg, Buttocks, Front perineal area,  Left arm     Bathing assist Assist Level: Total Assistance - Patient < 25%     Upper Body Dressing/Undressing Upper body dressing   What is the patient wearing?: Pull over shirt    Upper body assist Assist Level: Moderate Assistance - Patient 50 - 74%    Lower Body Dressing/Undressing Lower body dressing      What is the patient wearing?: Pants, Incontinence brief     Lower body assist Assist for lower body dressing: Maximal Assistance - Patient 25 - 49%     Toileting Toileting    Toileting assist Assist for toileting: Maximal Assistance - Patient 25 - 49%     Transfers Chair/bed transfer  Transfers assist     Chair/bed transfer assist level: Moderate Assistance - Patient 50 - 74% (squat pivot) Chair/bed transfer assistive device: Armrests   Locomotion Ambulation   Ambulation assist   Ambulation activity did not occur: Safety/medical concerns  Assist level: 2 helpers Assistive device: Other (comment) (wall rail on L ace wrap on L) Max distance: 15   Walk 10 feet activity   Assist  Walk 10 feet activity did not occur: Safety/medical concerns  Assist level: 2 helpers Assistive device: Other (comment) (wall rail on L, ace wrap on R)  Walk 50 feet activity   Assist Walk 50 feet with 2 turns activity did not occur: Safety/medical concerns         Walk 150 feet activity   Assist Walk 150 feet activity did not occur: Safety/medical concerns         Walk 10 feet on uneven surface  activity   Assist Walk 10 feet on uneven surfaces activity did not occur: Safety/medical concerns         Wheelchair     Assist Is the patient using a wheelchair?: Yes      Wheelchair assist level: Dependent - Patient 0% Max wheelchair distance: 50    Wheelchair 50 feet with 2 turns activity    Assist        Assist Level: Dependent - Patient 0%   Wheelchair 150 feet activity     Assist      Assist Level: Dependent - Patient 0%    Blood pressure (!) 152/69, pulse 80, temperature 98 F (36.7 C), resp. rate 18, height 5\' 1"  (1.549 m), weight 76.2 kg, SpO2 98 %.  Medical Problem List and Plan: 1.  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  Impaired mobility; d/c Lovenox given bleeding from injection site and ambulation>150 feet             -antiplatelet therapy: Continue Aspirin 325 mg daily and Plavix 75 mg daily x3 months then Plavix alone 3. Pain: Continue Neurontin 200 mg nightly, Flexeril as needed  Voltaren gel ordered to left knee on 8/31  Controlled on 9/9. Provided with a pain relief journal.  4. Mood: Provide emotional support             -antipsychotic agents: N/A 5. Neuropsych: This patient is capable of making decisions on her own behalf. 6. Skin/Wound Care: Routine skin checks  8/30-  has a scab for "years' on front of scalp- needs Derm after d/c  7. Fluids/Electrolytes/Nutrition: Routine and outs with follow-up chemistries ordered 8.  Symptomatic left ICA stenosis.  Status post left transcarotid artery revascularization 04/06/2021.  Vascular surgery following 9.  Hypertension.  Norvasc 10 mg daily, Increase HCTZ to 25 mg daily.  Monitor with increased mobility  9/9: increase HCTZ to 37.5mg   Vitals:   05/04/21 1958 05/05/21 0453  BP: (!) 178/106 (!) 152/69  Pulse: 100 80  Resp: 18 18  Temp: 98 F (36.7 C) 98 F (36.7 C)  SpO2: 99% 98%    10.  Diabetes mellitus with hyperglycemia.  Hemoglobin A1c 6.5.  SSI.             -monitor CBG's AC/HS CBG (last 3)  Recent Labs    05/04/21 1634 05/04/21 2119 05/05/21 0613  GLUCAP 85 147* 113*  Controlled  11.  Hyperlipidemia.  Continue Lipitor 12.  GERD.   Protonix 13.  CKD stage III.  Baseline creatinine 1.18-1.34.     Creatinine 1.17 on 8/29, stable at 1.08 on 9/5 14.  Obesity.  BMI 31.74.  Dietary follow-up 15.  COPD.  Quit smoking 33 years ago.  Check oxygen saturations every shift              -  oxygen per [place] at 2L currently  16. Urinary retention with painful caths-continue foley   -voiding trial this week once mobility improves?   -flomax on board   9/7  voiding trial in progress no retention, chronic urinary incont, had BSC next to bed PTA, start toileting program , cont myrbetriq  17.  Left toe bruising in sensate foot, pt does not recall an injury , exam unremarkable check xray  8/24- has nondisplaced acute fracture of L 3rd middle phalanx- will tape to 2nd toe to help.  18. Constipation  Improving 19. Spasticity  8/25- changed Zanaflex to 6pm nightly-  ` Dantrolene increased to 50 mg QHS/6pm to see if can help spasticity- cannot tolerate Zanaflex during day.   Dantrolene 25 mg QHS/6pm last night- no side effects- will need to be on at least 3-4 days before increasing to 50 mg QHS.   9/1- dantrolene is now 50 mg QHS 20. Bowel incontinence-   9/1- pt educated that this is normal with stroke pts- will hopefully improve.  21.  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

## 2021-05-05 NOTE — Progress Notes (Signed)
Physical Therapy Session Note  Patient Details  Name: Jade [name] MRN: [id number] Date of Birth: Jul 07, 1956  Today's Date: 05/05/2021 PT Individual Time: 0805-0915 PT Individual Time Calculation (min): 70 min   Short Term Goals:  Week 3:  PT Short Term Goal 1 (Week 3): = to LTGs based on ELOS   Skilled Therapeutic Interventions/Progress Updates:   Pt received supine in bed and agreeable to PT.  PT instructed pt in supine NMR to improved RLE and trunkal control: hip abduction/adduction, lumber rotation R and L x 15 Bil with AROM progressing to manual resistance, rolling R and L x 3 bil. Supine>sit transfer with mod assist and cues for sequencing for control of R side trunk.   Stand pivot transfer to [facility] with max assist for safety. Pt transported to rehab gym in [facility]. Sit<>stand with UE support on loffstrand crutch x 4 with mod-max assist. Gait training with loff strand forward/reverse 5 x 6 ft. Pt required to have UE support on rail with retroversion on first 2 bouts due to motor planning deficits and fear of falling. WC follow for safety. Max cues and facilitation to improve weight shift, midline orientation. Patient returned to room and attempted to have urination on [facility] over toielt. Min assist for Steady transfer with BUE support on stedy. Pt then left sitting in WC with call bell in reach and all needs met.          Therapy Documentation Precautions:  Precautions Precautions: Fall Precaution Comments: RUE hemparesis with increased tone, diplopia Restrictions Weight Bearing Restrictions: No  Pain: Pain Assessment Pain Score: 3  Pt repositioned with mild improvement.    Therapy/Group: Individual Therapy  Jade [name] 05/05/2021, 12:10 PM

## 2021-05-05 NOTE — Progress Notes (Addendum)
Occupational Therapy Weekly Progress Note  Patient Details  Name: Jade [name] MRN: [id number] Date of Birth: 1956/04/27  Beginning of progress report period: April 27, 2022 End of progress report period: May 05, 2021  Today's Date: 05/05/2021 OT Individual Time: 1001-1103 OT Individual Time Calculation (min): 62 min    Patient has met 2 of 3 short term goals.  Pt is making steady progress with OT at this time.  She is able to complete UB bathing with min assist and UB dressing at mod assist for pullover shirt.  She is able to complete LB bathing at max assist sit to stand with LB dressing at the same level.  She continues with moderate RUE and RLE hemiparesis.  Increased tone is noted in the digit flexors of the left hand as well as the wrist.  She needs max hand over hand for integration into bathing tasks as an active assist.  Movement is currently at [facility] stage III level in the hand and arm.  She is able to complete squat pivot transfers at a mod assist level with stand pivot transfers requiring max assist.  Increased pusher tendencies are still present to the right side with transitional movements and standing.  Increased trunk and cervical flexion are also still noted but improving in both sitting and standing.  Overall feel she is progressing in therapy, but is slightly slower than previously expected.  Recommend continued CIR level OT with plan for family education next week in preparation for discharge home on 9/16.  Feel she will need greater assist than current min assist level goals.  Will downgrade appropriately.    Patient continues to demonstrate the following deficits: muscle weakness and muscle joint tightness, impaired timing and sequencing, abnormal tone, unbalanced muscle activation, decreased coordination, and decreased motor planning, decreased awareness, and decreased standing balance, decreased postural control, hemiplegia, and decreased balance strategies and  therefore will continue to benefit from skilled OT intervention to enhance overall performance with BADL and Reduce care partner burden.  Patient not progressing toward long term goals.  See goal revision..  Continue plan of care.  OT Short Term Goals Week 4:   Continue working on established LTGs set at [employer] assist level overall.   Skilled Therapeutic Interventions/Progress Updates:    Session 1: (1001-1103) No report of pain noted during session.  Pt worked on bathing and dressing during session.  Max assist for stand pivot transfer to the tub bench for shower.  She was able to complete UB bathing with min assist and LB bathing at max assist level.  Max hand over hand for integration for using the RUE to help pour the soap on the washcloth and for washing the LUE.  Still with some increased flexor tone in the digits.  Once dried off she was able to transfer to the wheelchair at max assist.  She completed UB bathing at mod assist and then LB dressing at max assist with sit to stand at mod assist level.  Total assist for donning TEDs and shoes.  Finished session with pt sitting in the wheelchair and with the call button and phone in reach.     Session 2: ([id number])  Worked on doffing soiled brief and donning new brief to begin session.  She needed mod assist for sit to stand at the sink with mod assist for peri cleaning with max assist for clothing management.  Max assist was needed for donning new brief and new paper scrub pants over her feet  with shoes in place.  Once complete, took her down to the therapy gym where she transferred stand pivot to the therapy mat at max assist and then into supine at mod assist.  Completed scapular mobilizations in left sidelying with pt completing some AROM for depression and adduction.  Pain at times noted at a 2/10 on the faces scale with shoulder/scapular movements. Transitioned back to supine where she worked on [employer] shoulder flexion with elbow extension from  supine.  She was able to demonstrate active movement but increased flexor tone was noted in digits with reaching.  Therapist applied NMES to the digit extensors of the right hand at levels 12-15 intensity while completing movements of shoulder flexion with elbow extension in supine.  She was able to begin with level 12 and slowly ramp up to 15 over the 15 mins of active time.  No redness or pain noted when stimulation was removed.  See below for settings and parameters.  She completed stand pivot transfer back to the wheelchair at max assist with return to the room.  Call button and phone in reach with safety belt in place.   [place] Stim One 330 pulse width 35 Hz pulse rate On 8 sec/ off 8 sec Ramp up/ down 2 sec Symmetrical Biphasic wave form  Max intensity 173mA at 500 Ohm load  Therapy Documentation Precautions:  Precautions Precautions: Fall Precaution Comments: RUE hemparesis with increased tone, diplopia Restrictions Weight Bearing Restrictions: No  Pain: Pain Assessment Pain Scale: Faces Pain Score: 0-No pain ADL: See Care Tool Section for some details of mobility and selfcare   Therapy/Group: Individual Therapy  Zamaya Rapaport OTR/L 05/05/2021, 12:40 PM

## 2021-05-06 LAB — GLUCOSE, CAPILLARY
Glucose-Capillary: 134 mg/dL — ABNORMAL HIGH (ref 70–99)
Glucose-Capillary: 128 mg/dL — ABNORMAL HIGH (ref 70–99)
Glucose-Capillary: 147 mg/dL — ABNORMAL HIGH (ref 70–99)
Glucose-Capillary: 108 mg/dL — ABNORMAL HIGH (ref 70–99)

## 2021-05-06 NOTE — Progress Notes (Signed)
PROGRESS NOTE   Subjective/Complaints: Some general complaints of pain and that staff is "not full-sized" on weekend.   ROS: Patient denies fever, rash, sore throat, blurred vision, nausea, vomiting, diarrhea, cough, shortness of breath or chest pain,  headache, or mood change.   Objective:   No results found. No results for input(s): WBC, HGB, HCT, PLT in the last 72 hours.  No results for input(s): NA, K, CL, CO2, GLUCOSE, BUN, CREATININE, CALCIUM in the last 72 hours.    Intake/Output Summary (Last 24 hours) at 05/06/2021 1113 Last data filed at 05/05/2021 1824 Gross per 24 hour  Intake 480 ml  Output --  Net 480 ml        Physical Exam: Vital Signs Blood pressure (!) 184/104, pulse 66, temperature 97.9 F (36.6 C), resp. rate 18, height 5\' 1"  (1.549 m), weight 76.2 kg, SpO2 97 %. Constitutional: No distress . Vital signs reviewed. HEENT: NCAT, EOMI, oral membranes moist Neck: supple Cardiovascular: RRR without murmur. No JVD    Respiratory/Chest: CTA Bilaterally without wheezes or rales. Normal effort    GI/Abdomen: BS +, non-tender, non-distended Ext: no clubbing, cyanosis, or edema Psych: pleasant and cooperative  Skin: intact Musculoskeletal:  RUE: Elbow flexion/extension 2+/5, stable R lower extremity: Ankle dorsiflexion 1/5 mAS: Right hand 2/4  Assessment/Plan: 1. Functional deficits which require 3+ hours per day of interdisciplinary therapy in a comprehensive inpatient rehab setting. Physiatrist is providing close team supervision and 24 hour management of active medical problems listed below. Physiatrist and rehab team continue to assess barriers to discharge/monitor patient progress toward functional and medical goals  Care Tool:  Bathing    Body parts bathed by patient: Chest, Abdomen, Right arm, Right upper leg, Left upper leg, Face, Left lower leg   Body parts bathed by helper: Front perineal  area, Buttocks, Left arm, Right lower leg     Bathing assist Assist Level: Maximal Assistance - Patient 24 - 49%     Upper Body Dressing/Undressing Upper body dressing   What is the patient wearing?: Pull over shirt    Upper body assist Assist Level: Moderate Assistance - Patient 50 - 74%    Lower Body Dressing/Undressing Lower body dressing      What is the patient wearing?: Pants, Incontinence brief     Lower body assist Assist for lower body dressing: Maximal Assistance - Patient 25 - 49%     Toileting Toileting    Toileting assist Assist for toileting: Maximal Assistance - Patient 25 - 49%     Transfers Chair/bed transfer  Transfers assist     Chair/bed transfer assist level: Moderate Assistance - Patient 50 - 74% (squat pivot) Chair/bed transfer assistive device: Armrests   Locomotion Ambulation   Ambulation assist   Ambulation activity did not occur: Safety/medical concerns  Assist level: 2 helpers Assistive device: Other (comment) (wall rail on L ace wrap on L) Max distance: 15   Walk 10 feet activity   Assist  Walk 10 feet activity did not occur: Safety/medical concerns  Assist level: 2 helpers Assistive device: Other (comment) (wall rail on L, ace wrap on R)   Walk 50 feet activity   Assist  Walk 50 feet with 2 turns activity did not occur: Safety/medical concerns         Walk 150 feet activity   Assist Walk 150 feet activity did not occur: Safety/medical concerns         Walk 10 feet on uneven surface  activity   Assist Walk 10 feet on uneven surfaces activity did not occur: Safety/medical concerns         Wheelchair     Assist Is the patient using a wheelchair?: Yes      Wheelchair assist level: Dependent - Patient 0% Max wheelchair distance: 50    Wheelchair 50 feet with 2 turns activity    Assist        Assist Level: Dependent - Patient 0%   Wheelchair 150 feet activity     Assist       Assist Level: Dependent - Patient 0%   Blood pressure (!) 184/104, pulse 66, temperature 97.9 F (36.6 C), resp. rate 18, height 5\' 1"  (1.549 m), weight 76.2 kg, SpO2 97 %.  Medical Problem List and Plan: 1.  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  Impaired mobility; d/c Lovenox given bleeding from injection site and ambulation>150 feet             -antiplatelet therapy: Continue Aspirin 325 mg daily and Plavix 75 mg daily x3 months then Plavix alone 3. Pain: Continue Neurontin 200 mg nightly, Flexeril as needed  Voltaren gel ordered to left knee on 8/31  Controlled on 9/10. Provided with a pain relief journal.  4. Mood: Provide emotional support             -antipsychotic agents: N/A 5. Neuropsych: This patient is capable of making decisions on her own behalf. 6. Skin/Wound Care: Routine skin checks  8/30-  has a scab for "years' on front of scalp- needs Derm after d/c  7. Fluids/Electrolytes/Nutrition: Routine and outs with follow-up chemistries ordered 8.  Symptomatic left ICA stenosis.  Status post left transcarotid artery revascularization 04/06/2021.  Vascular surgery following 9.  Hypertension.  Norvasc 10 mg daily, Increase HCTZ to 25 mg daily.  Monitor with increased mobility  9/9: increased HCTZ to 37.5mg    9/10 may need another agent   -check bmet Monday Vitals:   05/06/21 0436 05/06/21 0845  BP: (!) 166/69 (!) 184/104  Pulse: 66   Resp: 18   Temp: 97.9 F (36.6 C)   SpO2: 97%     10.  Diabetes mellitus with hyperglycemia.  Hemoglobin A1c 6.5.  SSI.             -monitor CBG's AC/HS CBG (last 3)  Recent Labs    05/05/21 1621 05/05/21 2117 05/06/21 0619  GLUCAP 138* 179*  134*  Controlled  11.  Hyperlipidemia.  Continue Lipitor 12.  GERD.  Protonix 13.  CKD stage III.  Baseline creatinine 1.18-1.34.     Creatinine 1.17 on 8/29, stable at 1.08 on 9/5 14.  Obesity.  BMI 31.74.  Dietary follow-up 15.  COPD.  Quit smoking 33 years ago.  Check oxygen saturations every shift              -  oxygen per [place] at 2L currently  16. Urinary retention with painful caths-continue foley   -flomax on board  9/7  voiding trial in progress no retention, chronic urinary incont, had BSC next to bed PTA, start toileting program , cont myrbetriq  17.  Left toe bruising in sensate foot, pt does not recall an injury , exam unremarkable check xray  8/24- has nondisplaced acute fracture of L 3rd middle phalanx- will tape to 2nd toe to help.  18. Constipation  Improving 19. Spasticity  8/25- changed Zanaflex to 6pm nightly-  ` Dantrolene increased to 50 mg QHS/6pm to see if can help spasticity- cannot tolerate Zanaflex during day.   Dantrolene 25 mg QHS/6pm last night- no side effects- will need to be on at least 3-4 days before increasing to 50 mg QHS.   9/1- dantrolene is now 50 mg QHS 20. Bowel incontinence-   9/1- pt educated that this is normal with stroke pts- will hopefully improve.  21.  hypoK+ likely due to HCTZ addition will increase KCL to BID - wants a "small K+ tablet", has taken K+ supplement at home "99mg " KCL is ~ 400mg  K gluconate, discussed dietary options to increase K+ but most are high in glucose   LOS: 23 days A FACE TO FACE EVALUATION WAS PERFORMED  05/06/2021, 11:13 AM

## 2021-05-06 NOTE — Progress Notes (Addendum)
Occupational Therapy Session Note  Patient Details  Name: Jade [name] MRN: [id number] Date of Birth: [birth year]  Today's Date: 05/07/2021 OT Group Time:  - 60 minutes missed     Skilled Therapeutic Interventions/Progress Updates:    Pt declined participation in group today. 60 minutes missed.   Therapy Documentation Precautions:  Precautions Precautions: Fall Precaution Comments: RUE hemparesis with increased tone, diplopia Restrictions Weight Bearing Restrictions: No   Therapy/Group: Group Therapy  [name] A Melchor Kirchgessner 05/07/2021, 12:37 PM

## 2021-05-06 NOTE — Progress Notes (Signed)
Physical Therapy Session Note  Patient Details  Name: Jade [name] MRN: [id number] Date of Birth: 1955/11/24  Today's Date: 05/06/2021 PT Individual Time: 0907-1009 and 4008-6761 PT Individual Time Calculation (min): 62 min and 39 min  Short Term Goals: Week 3:  PT Short Term Goal 1 (Week 3): = to LTGs based on ELOS  Skilled Therapeutic Interventions/Progress Updates:    Session 1: Pt received sitting in w/c and agreeable to therapy session. Reports wanting to try to void bladder prior to going to therapy gym. Sit>stand w/c>stedy with CGA for safety while coming to rise - pt noted to rely heavily on L UE to pull into stand. Stedy transport in/out of bathroom. Sit<>stand to/from stedy seat with supervision - removed L UE support from stedy bar providing HHA instead and educated pt on importance of learning correct motor plan to come to stand opposed to compensating with stedy bar requiring min/mod assist to stand this way. Increased time spent on [facility] over toilet with pt unable to void. Donned clean LB clothing, B LE TED hose, shoes, and R LE [place] Walk-on PLS AFO total assist for time management.   Transported to/from gym in w/c for time management and energy conservation.   Donned L knee ACE wrap for pain management. Therapist set-up patient with RW and R hand orthosis. Sit>stand w/c>RW with cuing to push up with L UE and maintain anterior trunk lean while coming to stand with light mod assist. Gait training 14ft using RW with mod progressed to max assist for balance at end when turning to sit on mat due to worsening R lean and pt transitioning to crouched posture when attempting to step backwards - requires min assist to repositioning R LE after swing advancement due to scissoring, requires min assist to facilitate increased R knee extension during stance, cuing to increase and maintain trunk/hip extension throughout for upright posture.   R stand pivot EOM>w/c using RW with max assist of 1  for balance due to strong R lean and pt again transitioning to crouched posture when attempting to step back towards w/c - requires repeated cuing to "stand tall" while stepping back. Transported back to room and left seated in w/c with needs in reach, seat belt alarm on, and R UE therapeutically positioned on pillows.    Session 2: Pt received sitting in w/c and agreeable to therapy session.  Transported to/from gym in w/c for time management and energy conservation. Wearing R LE [place] Walk-on PLS AFO.   Sit>stand w/c>RW (with R hand orthotic) light mod assist for lifting to stand and balance due to R posterior lean - cuing to push up with L UE from armrest and extend R hip/knee in standing - requires dependent assist to place R hand on/off RW orthotic.  Gait training 82ft 2x using RW with max assist of 1 for balance (increased assistance needed compared to AM session due to later in day) - more assist needed during 1st walk this afternoon with heavy R lateral lean and pt flexing down into a crouched posture requiring frequent/repeated cues to "stand up tall" - able to advance R LE during swing but with excessive hip adduction and external rotation requiring manual assist to reposition prior to initiating stance phase. When turning to sit down on mat/in w/c requires max step-by-step cuing for LE stepping and AD management along with heavy assist to maintain balance due to posterior lean and therapist facilitating cuing for sustained R knee extension while stepping posteriorly towards seat.  Pt reporting significant R hand pain on the orthotics due to flexor tone causing fingers to curl up - tried to utilize moist heat during session to relax hand but limited improvement due to time constraints.  At end of session pt left seated in w/c with needs in reach, seat belt alarm on, and R UE therapeutically positioned on pillows. Removed PLS AFO and L knee ACE wrap.  Therapy Documentation Precautions:   Precautions Precautions: Fall Precaution Comments: RUE hemparesis with increased tone, diplopia Restrictions Weight Bearing Restrictions: No   Pain:  Session 1: i Reports R shoulder pain in addition to chronic L knee pain - Voltaren gel - therapist provided seated rest break, ACE wrap, and repositioning for pain management.  Session 2: Chronic L knee pain - utilized ACE wrap for pain management.   Therapy/Group: Individual Therapy  Ginny Forth , PT, DPT, NCS, CSRS 05/06/2021, 7:56 AM

## 2021-05-07 LAB — GLUCOSE, CAPILLARY
Glucose-Capillary: 128 mg/dL — ABNORMAL HIGH (ref 70–99)
Glucose-Capillary: 81 mg/dL (ref 70–99)
Glucose-Capillary: 115 mg/dL — ABNORMAL HIGH (ref 70–99)
Glucose-Capillary: 152 mg/dL — ABNORMAL HIGH (ref 70–99)

## 2021-05-08 ENCOUNTER — Other Ambulatory Visit: Payer: Self-pay

## 2021-05-08 DIAGNOSIS — I6521 Occlusion and stenosis of right carotid artery: Secondary | ICD-10-CM

## 2021-05-08 LAB — CBC WITH DIFFERENTIAL/PLATELET
Abs Immature Granulocytes: 0.04 10*3/uL (ref 0.00–0.07)
Basophils Absolute: 0.1 10*3/uL (ref 0.0–0.1)
Basophils Relative: 1 %
Eosinophils Absolute: 0.3 10*3/uL (ref 0.0–0.5)
Eosinophils Relative: 3 %
HCT: 34.7 % — ABNORMAL LOW (ref 36.0–46.0)
Hemoglobin: 11.4 g/dL — ABNORMAL LOW (ref 12.0–15.0)
Immature Granulocytes: 0 %
Lymphocytes Relative: 20 %
Lymphs Abs: 1.9 10*3/uL (ref 0.7–4.0)
MCH: 32.4 pg (ref 26.0–34.0)
MCHC: 32.9 g/dL (ref 30.0–36.0)
MCV: 98.6 fL (ref 80.0–100.0)
Monocytes Absolute: 0.7 10*3/uL (ref 0.1–1.0)
Monocytes Relative: 7 %
Neutro Abs: 6.8 10*3/uL (ref 1.7–7.7)
Neutrophils Relative %: 69 %
Platelets: 331 10*3/uL (ref 150–400)
RBC: 3.52 MIL/uL — ABNORMAL LOW (ref 3.87–5.11)
RDW: 13.7 % (ref 11.5–15.5)
WBC: 9.9 10*3/uL (ref 4.0–10.5)
nRBC: 0 % (ref 0.0–0.2)

## 2021-05-08 LAB — GLUCOSE, CAPILLARY
Glucose-Capillary: 107 mg/dL — ABNORMAL HIGH (ref 70–99)
Glucose-Capillary: 193 mg/dL — ABNORMAL HIGH (ref 70–99)
Glucose-Capillary: 135 mg/dL — ABNORMAL HIGH (ref 70–99)
Glucose-Capillary: 129 mg/dL — ABNORMAL HIGH (ref 70–99)

## 2021-05-08 LAB — BASIC METABOLIC PANEL
Anion gap: 9 (ref 5–15)
BUN: 22 mg/dL (ref 8–23)
CO2: 22 mmol/L (ref 22–32)
Calcium: 9.3 mg/dL (ref 8.9–10.3)
Chloride: 108 mmol/L (ref 98–111)
Creatinine, Ser: 1.02 mg/dL — ABNORMAL HIGH (ref 0.44–1.00)
GFR, Estimated: >60 mL/min (ref >60)
Glucose, Bld: 101 mg/dL — ABNORMAL HIGH (ref 70–99)
Potassium: 3.6 mmol/L (ref 3.5–5.1)
Sodium: 139 mmol/L (ref 135–145)

## 2021-05-08 MED ORDER — HYDROCHLOROTHIAZIDE 12.5 MG PO CAPS
12.5000 mg | ORAL_CAPSULE | Freq: Every day | ORAL | Status: DC
  Administered 2021-05-09 – 2021-05-12 (×4): 12.5 mg via ORAL
  Filled 2021-05-08 (×4): qty 1

## 2021-05-08 MED ORDER — LISINOPRIL 5 MG PO TABS
5.0000 mg | ORAL_TABLET | Freq: Every day | ORAL | Status: DC
  Administered 2021-05-08 – 2021-05-10 (×3): 5 mg via ORAL
  Filled 2021-05-08 (×3): qty 1

## 2021-05-08 NOTE — Progress Notes (Signed)
Occupational Therapy Session Note  Patient Details  Name: Jade [name] MRN: [id number] Date of Birth: Mar 08, 1956  Today's Date: 05/08/2021 OT Individual Time: 1000-1100 OT Individual Time Calculation (min): 60 min    Short Term Goals: Week 4:  OT Short Term Goal 1 (Week 4): Pt will continue to work on established LTGs at min to mod assist overall.  Skilled Therapeutic Interventions/Progress Updates:    Patient seated on toilet with nursing at start of session.  Able to assist with hygiene but overall max A, clothing management in stance dependent.  Utilized stedy toilet to w/c.  She is able to stand in stedy with CGA.  Oral care set up.  OH shirt mod/max A.  Sit to stand min A.  SPT to/from w/c and mat table mod/max A.  Completed unsupported seated trunk and posture activities, right motor control with AAROM, weight bearing.   Standing weight shift and stepping activities.  She remained seated in w/c at close of session, seat alarm set and call bell in hand.    Therapy Documentation Precautions:  Precautions Precautions: Fall Precaution Comments: RUE hemparesis with increased tone, diplopia Restrictions Weight Bearing Restrictions: No   Therapy/Group: Individual Therapy  Barrie Lyme 05/08/2021, 7:31 AM

## 2021-05-08 NOTE — Progress Notes (Signed)
Physical Therapy Session Note  Patient Details  Name: Jade [name] MRN: [id number] Date of Birth: February 14, 1956  Today's Date: 05/08/2021 PT Individual Time: 1300-1400; 5053-9767 PT Individual Time Calculation (min): 60 min and 55 min  Short Term Goals: Week 3:  PT Short Term Goal 1 (Week 3): = to LTGs based on ELOS  Skilled Therapeutic Interventions/Progress Updates:    Session 1: Pt received seated in w/c in room, agreeable to PT session. No complaints of pain at rest, has some L knee and back pain with mobility that improves at rest. ACE-wrapped L knee for improved stability and pain management during session. Squat pivot transfer w/c to/from mat table with mod A to the L. Sit to stand with max A to RW with R hand orthosis, R knee blocked. Use of mirror in standing for visual feedback for upright posture due to R lateral lean. Pt able to perform mini-squat x 1 rep with manual cueing through R knee for extension. Pt unable to recover balance following squat with R lateral lean, slowly lowered to sitting on mat table. Pt requesting exercises she can perform at home. Sit to supine mod A for BLE management. Reviewed HEP and provided handout for patient, see below. Supine to sit with max A from flat mat table. Squat pivot transfer back to w/c with mod A to the L. Pt left seated in w/c in room with needs in reach, quick release belt and chair alarm in place.  Access Code: [email or web address] URL: [email or web address] Date: 05/08/2021 Prepared by: Peter Congo  Exercises Supine Bridge - 1 x daily - 7 x weekly - 2 sets - 10 reps Supine Single Bent Knee Fallout - 1 x daily - 7 x weekly - 2 sets - 5 reps Supine Heel Slide - 1 x daily - 7 x weekly - 2 sets - 10 reps Supine Quad Set - 1 x daily - 7 x weekly - 2 sets - 5 reps - 5 sec hold Seated Long Arc Quad - 1 x daily - 7 x weekly - 2 sets - 10 reps Seated March - 1 x daily - 7 x weekly - 2 sets - 10 reps   Session 2: Pt received  seated in w/c in room, agreeable to PT session. No complaints of pain. Squat pivot transfer w/c to/from mat table with mod A. Sit to stand with max A to RW with use of mirror for visual feedback due to R lateral lean. Provided manual cues and assist for maintaining R knee extension in standing. Placed 1" step under RLE to encourage weight shift more to midline in standing. Pt able to perform standing x 5 reps. Pt has onset of RUE pain when placed in RUE splint on RW, placed UE in gait belt around waist and pt reports improved comfort in this position. However, pt tends to push/pull with LUE on RW to maintain balance so encouraged RUE return to RW. Pt unable to tolerate RUE on R hand splint due to pain. Pt left seated in w/c in room with needs in reach, quick release belt and chair alarm in place at end of session.  Therapy Documentation Precautions:  Precautions Precautions: Fall Precaution Comments: RUE hemparesis with increased tone, diplopia Restrictions Weight Bearing Restrictions: No    Therapy/Group: Individual Therapy   Peter Congo, PT, DPT, CSRS  05/08/2021, 5:01 PM

## 2021-05-08 NOTE — Progress Notes (Signed)
PROGRESS NOTE   Subjective/Complaints:  No issues overnite , discussed OP f/u  Finger flexors tight on RIght side  ROS: Patient denies CP, SOB, N/V/D.   Objective:   No results found. Recent Labs    05/08/21 0639  WBC 9.9  HGB 11.4*  HCT 34.7*  PLT 331    Recent Labs    05/08/21 0639  NA 139  K 3.6  CL 108  CO2 22  GLUCOSE 101*  BUN 22  CREATININE 1.02*  CALCIUM 9.3      Intake/Output Summary (Last 24 hours) at 05/08/2021 0811 Last data filed at 05/08/2021 0700 Gross per 24 hour  Intake 700 ml  Output --  Net 700 ml         Physical Exam: Vital Signs Blood pressure (!) 163/70, pulse 85, temperature 98 F (36.7 C), temperature source Oral, resp. rate 18, height 5\' 1"  (1.549 m), weight 76.2 kg, SpO2 98 %.  General: No acute distress Mood and affect are appropriate Heart: Regular rate and rhythm no rubs murmurs or extra sounds Lungs: Clear to auscultation, breathing unlabored, no rales or wheezes Abdomen: Positive bowel sounds, soft nontender to palpation, nondistended Extremities: No clubbing, cyanosis, or edema Skin: No evidence of breakdown, no evidence of rash Musculoskeletal:  RUE: Elbow flexion/extension 2+/5, stable R lower extremity: Ankle dorsiflexion 1/5 mAS: Right hand 2/4 in finger flexors and thumb flexors  Assessment/Plan: 1. Functional deficits which require 3+ hours per day of interdisciplinary therapy in a comprehensive inpatient rehab setting. Physiatrist is providing close team supervision and 24 hour management of active medical problems listed below. Physiatrist and rehab team continue to assess barriers to discharge/monitor patient progress toward functional and medical goals  Care Tool:  Bathing    Body parts bathed by patient: Chest, Abdomen, Right arm, Right upper leg, Left upper leg, Face, Left lower leg   Body parts bathed by helper: Front perineal area, Buttocks,  Left arm, Right lower leg     Bathing assist Assist Level: Maximal Assistance - Patient 24 - 49%     Upper Body Dressing/Undressing Upper body dressing   What is the patient wearing?: Pull over shirt    Upper body assist Assist Level: Moderate Assistance - Patient 50 - 74%    Lower Body Dressing/Undressing Lower body dressing      What is the patient wearing?: Pants, Incontinence brief     Lower body assist Assist for lower body dressing: Maximal Assistance - Patient 25 - 49%     Toileting Toileting    Toileting assist Assist for toileting: Maximal Assistance - Patient 25 - 49%     Transfers Chair/bed transfer  Transfers assist     Chair/bed transfer assist level: Maximal Assistance - Patient 25 - 49% (stand pivot) Chair/bed transfer assistive device:   Ambulation assist   Ambulation activity did not occur: Safety/medical concerns  Assist level: Maximal Assistance - Patient 25 - 49% Assistive device: Walker-rolling Max distance: 59ft   Walk 10 feet activity   Assist  Walk 10 feet activity did not occur: Safety/medical concerns  Assist level: 2 helpers Assistive device: Other (comment) (wall rail on  L, ace wrap on R)   Walk 50 feet activity   Assist Walk 50 feet with 2 turns activity did not occur: Safety/medical concerns         Walk 150 feet activity   Assist Walk 150 feet activity did not occur: Safety/medical concerns         Walk 10 feet on uneven surface  activity   Assist Walk 10 feet on uneven surfaces activity did not occur: Safety/medical concerns         Wheelchair     Assist Is the patient using a wheelchair?: Yes      Wheelchair assist level: Dependent - Patient 0% Max wheelchair distance: 50    Wheelchair 50 feet with 2 turns activity    Assist        Assist Level: Dependent - Patient 0%   Wheelchair 150 feet activity     Assist      Assist Level:  Dependent - Patient 0%   Blood pressure (!) 163/70, pulse 85, temperature 98 F (36.7 C), temperature source Oral, resp. rate 18, height 5\' 1"  (1.549 m), weight 76.2 kg, SpO2 98 %.  Medical Problem List and Plan: 1.  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  Impaired mobility; d/c Lovenox given bleeding from injection site and ambulation>150 feet             -antiplatelet therapy: Continue Aspirin 325 mg daily and Plavix 75 mg daily x3 months then Plavix alone 3. Pain: Continue Neurontin 200 mg nightly, Flexeril as needed  Voltaren gel ordered to left knee on 8/31  Controlled on 9/10. Provided with a pain relief journal.  4. Mood: Provide emotional support             -antipsychotic agents: N/A 5. Neuropsych: This patient is capable of making decisions on her own behalf. 6. Skin/Wound Care: Routine skin checks  8/30-  has a scab for "years' on front of scalp- needs Derm after d/c  7. Fluids/Electrolytes/Nutrition: Routine and outs with follow-up chemistries ordered 8.  Symptomatic left ICA stenosis.  Status post left transcarotid artery revascularization 04/06/2021.  Vascular surgery following 9.  Hypertension.  Norvasc 10 mg daily, Increase HCTZ to 25 mg daily.  Monitor with increased mobility  9/9: increased HCTZ to 37.5mg    9/10 may need another agent   -check bmet Monday Vitals:   05/08/21 0349 05/08/21 0350  BP: (!) 163/70   Pulse: 82 85  Resp: 18   Temp: 98 F (36.7 C)   SpO2:  98%   Sys elevated consistently reduce HCTZ add ACE I 10.  Diabetes mellitus with hyperglycemia.  Hemoglobin A1c 6.5.  SSI.             -monitor CBG's AC/HS CBG (last 3)  Recent Labs     05/07/21 1655 05/07/21 2104 05/08/21 0614  GLUCAP 115* 152* 107*   Controlled 9/12 11.  Hyperlipidemia.  Continue Lipitor 12.  GERD.  Protonix 13.  CKD stage III.  Baseline creatinine 1.18-1.34.     Creatinine 1.17 on 8/29, stable at 1.08 on 9/5 14.  Obesity.  BMI  31.74.  Dietary follow-up 15.  COPD.  Quit smoking 33 years ago.  Check oxygen saturations every shift              -oxygen per [place] at 2L currently  16. Urinary retention with painful caths-continue foley   -flomax on board  9/7  voiding trial in progress no retention, chronic urinary incont, had BSC next to bed PTA, start toileting program , cont [id number]  17.  Left toe bruising in sensate foot, pt does not recall an injury , exam unremarkable check xray  8/24- has nondisplaced acute fracture of L 3rd middle phalanx- will tape to 2nd toe to help.  18. Constipation  Improving 19. Spasticity  8/25- changed Zanaflex to 6pm nightly-  ` Dantrolene increased to 50 mg QHS/6pm to see if can help spasticity- cannot tolerate Zanaflex during day.   Dantrolene 25 mg QHS/6pm last night- no side effects- will need to be on at least 3-4 days before increasing to 50 mg QHS.   9/1- dantrolene is now 50 mg QHS 20. Bowel incontinence-   9/1- pt educated that this is normal with stroke pts- will hopefully improve.  21.  hypoK+ likely due to HCTZ addition will increase KCL to BID - wants a "small K+ tablet", has taken K+ supplement at home "99mg " KCL is ~ 400mg  K gluconate, discussed dietary options to increase K+ but most are high in glucose   LOS: 25 days A FACE TO FACE EVALUATION WAS PERFORMED  05/08/2021, 8:11 AM

## 2021-05-09 LAB — GLUCOSE, CAPILLARY
Glucose-Capillary: 154 mg/dL — ABNORMAL HIGH (ref 70–99)
Glucose-Capillary: 133 mg/dL — ABNORMAL HIGH (ref 70–99)
Glucose-Capillary: 120 mg/dL — ABNORMAL HIGH (ref 70–99)
Glucose-Capillary: 119 mg/dL — ABNORMAL HIGH (ref 70–99)

## 2021-05-09 MED ORDER — TIZANIDINE HCL 4 MG PO TABS
6.0000 mg | ORAL_TABLET | ORAL | Status: DC
  Administered 2021-05-09 – 2021-05-11 (×3): 6 mg via ORAL
  Filled 2021-05-09 (×3): qty 1

## 2021-05-09 NOTE — Progress Notes (Signed)
Physical Therapy Session Note  Patient Details  Name: Jade [name] MRN: [id number] Date of Birth: 03/22/1956  Today's Date: 05/09/2021 PT Individual Time: 9767-3419 and 3790-2409 PT Individual Time Calculation (min): 68 min and 62 min  Short Term Goals: Week 3:  PT Short Term Goal 1 (Week 3): = to LTGs based on ELOS  Skilled Therapeutic Interventions/Progress Updates:    Session 1: Pt received sitting in w/c with her son, [name], arriving for family education/training. Pt agreeable to therapy session. Transported to/from gym in w/c for time management and energy conservation.   Thayer Ohm, [facility], present for orthotics consult. Sit>stand w/c>RW with mod assist for lifting to stand and balance due to R posterior lean - cuing to push up with LUE on w/c armrest and for increased anterior trunk lean/weight shift during transfer - requires dependent assist to place R hand on/off RW orthotic. Gait training ~14ft using RW with mod assist of 1 for balance due to R lean and for R LE management primarily to reposition during swing from adductor tone causing scissoring - lightly guarding R knee during stance to cue for increased knee extension and upright posture but no instances of buckling - continues to require frequent verbal cuing to maintain upright, trunk/hip/knee extended posture.  Educated pt/family that pt is not safe to ambulate with family upon D/C due to need for skilled physical assistance. Thayer Ohm, [facility] recommended toe-cap to improve swing phase advancement during gait training with therapy - pt/family in agreement.  Transitioned to focus on hands-on training for family. Educated on bed mobility, squat pivot transfers EOM<>w/c, and sit<>stand transfers using RW. Pt required light mod assist for bed mobility on mat to assist with trunk upright and R LE management. Squat pivot transfers w/c<>EOM with mod assist for lifting/pivoting hips - educated son on performing over-the-back assistance technique as  pt tends to extend trunk too soon during transfer preventing her from being able to clear her hips - reinforced proper hand placement and R knee guarding during every transfer along with head/hips relationship- therapist providing light min assist for safety during transfers.  Sit>stand w/c>RW with heavy mod assist for balance due to R posterior lean - pt's son assisted with this requiring repeated cuing to ensure one hand maintaining pt's balance while the other assists with R hand placement on/off RW orthotic. Pt's son demos good learning throughout session but will benefit from additional hands-on training. Pt transported back to room and left seated in w/c in the care of NT.   Session 2: Pt received sitting in w/c with her son, [name], present and pt agreeable to therapy session. Apolinar Junes, [employer] present for custom wheelchair consultation.  Discussed the following regarding wheelchair recommendation:  - need for K5 lightweight manual w/c to allow pt increased independence, at supervision level, for functional mobility - need for adjustable axle for improved UE alignment due to pt's hx of shoulder injuries  -  Need for contoured back support due to pt's significant posterior pelvic tilt and thoracic rounding  - need for hybrid cushion with foam anterior and roho air posterior to increase pt independence with functional transfers while providing adequate pressure relief as pt is unable to perform independent pressure relief - incontinence cover required - need for R UE padded half lap tray to support extremity due to paresis and hypertonia - need for hemi-height chair to allow pt to reach the floor to perform hemi-technique propulsion  Pt/family in agreement with these recommendations.    Transported to/from gym  in w/c for time management and energy conservation. Discussed car transfer with pt's son reporting the available vehicles are either a [employer] or [employer] SUV - recommended whichever vehicle has  lower floor to seat height with pt's son planning to take pictures and measure heights for family education tomorrow. Simulated car transfer using transfer board with pt requiring mod assist for lifting/pivoting hips and maintaining anterior trunk lean - cuing for sequencing and educating pt's son on how to place board and proper positioning for assistance - pt completed transfer with son providing hands-on assistance with mod cuing from thearpist. Discussed option of medical transport home if pt unable to safely perform car transfer with their actual vehicles and pt/family strongly declining this recommendation due to cost.  Pt/son report no questions/concerns at this time and pt's son planning to return tomorrow for additional hands-on training. At end of session, pt left seated in w/c with needs in reach and son present.  Therapy Documentation Precautions:  Precautions Precautions: Fall Precaution Comments: RUE hemparesis with increased tone, diplopia Restrictions Weight Bearing Restrictions: No   Pain: Session 1: Continues to have chronic L knee pain, premedicated and donned ACE wrap for pain management. Reports increased R UE pain due to poor positioning during the night and due to hypertonia - gentle movement of this UE during mobility throughout session for pain management.  Session 2: Continues to have chronic L knee pain and acute pain in R UE due to hypertonia - pre-medicated - assisted with R UE positioning for pain management.     Therapy/Group: Individual Therapy  Ginny Forth , PT, DPT, NCS, CSRS 05/09/2021, 12:18 PM

## 2021-05-09 NOTE — Progress Notes (Signed)
Occupational Therapy Session Note  Patient Details  Name: Jade [name] MRN: [id number] Date of Birth: 01/09/1956  Today's Date: 05/09/2021 OT Individual Time: 3382-5053 OT Individual Time Calculation (min): 74 min    Short Term Goals: Week 4:  OT Short Term Goal 1 (Week 4): Pt will continue to work on established LTGs at min to mod assist overall.  Skilled Therapeutic Interventions/Progress Updates:    Pt  worked on bathing and dressing sit to stand during session.  Antony Salmon was used for transfer to the 3:1 over the toilet with max assist for toilet hygiene and clothing management.  She then ambulated over to the tub bench with max assist and use of the grab bars.  Assist was needed overall at mod assist for placement of the RLE with stepping secondary to adduction as well as for stabilization with stepping.  She was able to complete shower with min assist for UB and max assist for LB sit to stand.  Max hand over hand assist to integrate the RUE for washing the LUE.  Pt with increased hand and arm pain last night and today per her report.  She was able to complete LB bathing with overall mod assist using a LH sponge to wash her lower legs and feet.  After drying when attempting to get out of the shower, she exhibited bowel and bladder incontinence in standing with therapist having to help her wash her LB as well as front and peri area with max assist in standing.  She then transferred stand pivot to the wheelchair at [facility] assist level.  Max assist for donning brief and pants with mod assist for donning pullover shirt.  She needed total assist for donning TEDs and shoes.  Noted pt with more confusion when asking her about when her family was coming.  She would get her son and spouse confused when discussing their working status as well as vehicles.  Nursing noted that pt was more confused this session at the end compared to any previous sessions.  She was left sitting in the wheelchair with the call  button and phone in reach with safety belt in place.      Therapy Documentation Precautions:  Precautions Precautions: Fall Precaution Comments: RUE hemparesis with increased tone, diplopia Restrictions Weight Bearing Restrictions: No  Pain: Pain Assessment Pain Scale: Faces Pain Score: 3  Faces Pain Scale: Hurts little more Pain Type: Acute pain Pain Location: Arm Pain Orientation: Right Pain Descriptors / Indicators: Discomfort Pain Frequency: Constant Pain Onset: With Activity Pain Intervention(s): Repositioned ADL: See Care Tool Section for some details of mobility and selfcare  Therapy/Group: Individual Therapy  Daysi Boggan OTR/L 05/09/2021, 12:31 PM

## 2021-05-09 NOTE — Plan of Care (Signed)
  Problem: RH Balance Goal: LTG Patient will maintain dynamic standing with ADLs (OT) Description: LTG:  Patient will maintain dynamic standing balance with assist during activities of daily living (OT)  Flowsheets (Taken 05/09/2021 1301) LTG: Pt will maintain dynamic standing balance during ADLs with: (goals downgraded based on progress) Moderate Assistance - Patient 50 - 74% Note: goals downgraded based on progress   Problem: Sit to Stand Goal: LTG:  Patient will perform sit to stand in prep for activites of daily living with assistance level (OT) Description: LTG:  Patient will perform sit to stand in prep for activites of daily living with assistance level (OT) Flowsheets (Taken 05/09/2021 1301) LTG: PT will perform sit to stand in prep for activites of daily living with assistance level: (goals downgraded based on progress) Moderate Assistance - Patient 50 - 74% Note: goals downgraded based on progress   Problem: RH Bathing Goal: LTG Patient will bathe all body parts with assist levels (OT) Description: LTG: Patient will bathe all body parts with assist levels (OT) Flowsheets (Taken 05/09/2021 1301) LTG: Pt will perform bathing with assistance level/cueing: (goals downgraded based on progress) Moderate Assistance - Patient 50 - 74% LTG: Position pt will perform bathing: Sit to Stand Note: goals downgraded based on progress   Problem: RH Dressing Goal: LTG Patient will perform lower body dressing w/assist (OT) Description: LTG: Patient will perform lower body dressing with assist, with/without cues in positioning using equipment (OT) Flowsheets (Taken 05/09/2021 1301) LTG: Pt will perform lower body dressing with assistance level of: (goals downgraded based on progress) Maximal Assistance - Patient 25 - 49% Note: goals downgraded based on progress   Problem: RH Toileting Goal: LTG Patient will perform toileting task (3/3 steps) with assistance level (OT) Description: LTG: Patient will  perform toileting task (3/3 steps) with assistance level (OT)  Flowsheets (Taken 05/09/2021 1301) LTG: Pt will perform toileting task (3/3 steps) with assistance level: (goals downgraded based on progress) Moderate Assistance - Patient 50 - 74% Note: goals downgraded based on progress   Problem: RH Toilet Transfers Goal: LTG Patient will perform toilet transfers w/assist (OT) Description: LTG: Patient will perform toilet transfers with assist, with/without cues using equipment (OT) Flowsheets (Taken 05/09/2021 1301) LTG: Pt will perform toilet transfers with assistance level of: (goals downgraded based on progress) Moderate Assistance - Patient 50 - 74% Note: goals downgraded based on progress

## 2021-05-09 NOTE — Progress Notes (Signed)
PROGRESS NOTE   Subjective/Complaints:  Labs reviewed ROS: Patient denies CP, SOB, N/V/D.   Objective:   No results found. Recent Labs    05/08/21 0639  WBC 9.9  HGB 11.4*  HCT 34.7*  PLT 331     Recent Labs    05/08/21 0639  NA 139  K 3.6  CL 108  CO2 22  GLUCOSE 101*  BUN 22  CREATININE 1.02*  CALCIUM 9.3       Intake/Output Summary (Last 24 hours) at 05/09/2021 0826 Last data filed at 05/08/2021 1817 Gross per 24 hour  Intake 480 ml  Output --  Net 480 ml         Physical Exam: Vital Signs Blood pressure (!) 155/56, pulse 72, temperature 98.1 F (36.7 C), resp. rate 17, height 5\' 1"  (1.549 m), weight 76.2 kg, SpO2 98 %.   General: No acute distress Mood and affect are appropriate Heart: Regular rate and rhythm no rubs murmurs or extra sounds Lungs: Clear to auscultation, breathing unlabored, no rales or wheezes Abdomen: Positive bowel sounds, soft nontender to palpation, nondistended Extremities: No clubbing, cyanosis, or edema Skin: No evidence of breakdown, no evidence of rash  RUE: Elbow flexion/extension 2+/5, stable R lower extremity: Ankle dorsiflexion 1/5 mAS: Right hand 2/4 in finger flexors and thumb flexors  Assessment/Plan: 1. Functional deficits which require 3+ hours per day of interdisciplinary therapy in a comprehensive inpatient rehab setting. Physiatrist is providing close team supervision and 24 hour management of active medical problems listed below. Physiatrist and rehab team continue to assess barriers to discharge/monitor patient progress toward functional and medical goals  Care Tool:  Bathing    Body parts bathed by patient: Chest, Abdomen, Right arm, Right upper leg, Left upper leg, Face, Left lower leg   Body parts bathed by helper: Front perineal area, Buttocks, Left arm, Right lower leg     Bathing assist Assist Level: Maximal Assistance - Patient 24 -  49%     Upper Body Dressing/Undressing Upper body dressing   What is the patient wearing?: Pull over shirt    Upper body assist Assist Level: Moderate Assistance - Patient 50 - 74%    Lower Body Dressing/Undressing Lower body dressing      What is the patient wearing?: Pants, Incontinence brief     Lower body assist Assist for lower body dressing: Maximal Assistance - Patient 25 - 49%     Toileting Toileting    Toileting assist Assist for toileting: Maximal Assistance - Patient 25 - 49%     Transfers Chair/bed transfer  Transfers assist     Chair/bed transfer assist level: Maximal Assistance - Patient 25 - 49% (stand pivot) Chair/bed transfer assistive device:   Ambulation assist   Ambulation activity did not occur: Safety/medical concerns  Assist level: Maximal Assistance - Patient 25 - 49% Assistive device: Walker-rolling Max distance: 52ft   Walk 10 feet activity   Assist  Walk 10 feet activity did not occur: Safety/medical concerns  Assist level: 2 helpers Assistive device: Other (comment) (wall rail on L, ace wrap on R)   Walk 50 feet activity   Assist  Walk 50 feet with 2 turns activity did not occur: Safety/medical concerns         Walk 150 feet activity   Assist Walk 150 feet activity did not occur: Safety/medical concerns         Walk 10 feet on uneven surface  activity   Assist Walk 10 feet on uneven surfaces activity did not occur: Safety/medical concerns         Wheelchair     Assist Is the patient using a wheelchair?: Yes      Wheelchair assist level: Dependent - Patient 0% Max wheelchair distance: 50    Wheelchair 50 feet with 2 turns activity    Assist        Assist Level: Dependent - Patient 0%   Wheelchair 150 feet activity     Assist      Assist Level: Dependent - Patient 0%   Blood pressure (!) 155/56, pulse 72, temperature 98.1 F (36.7 C), resp.  rate 17, height 5\' 1"  (1.549 m), weight 76.2 kg, SpO2 98 %.  Medical Problem List and Plan: 1.  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  Impaired mobility; d/c Lovenox given bleeding from injection site and ambulation>150 feet             -antiplatelet therapy: Continue Aspirin 325 mg daily and Plavix 75 mg daily x3 months then Plavix alone 3. Pain: Continue Neurontin 200 mg nightly, Flexeril as needed  Voltaren gel ordered to left knee on 8/31  Controlled on 9/10. Provided with a pain relief journal.  4. Mood: Provide emotional support             -antipsychotic agents: N/A 5. Neuropsych: This patient is capable of making decisions on her own behalf. 6. Skin/Wound Care: Routine skin checks  8/30-  has a scab for "years' on front of scalp- needs Derm after d/c  7. Fluids/Electrolytes/Nutrition: Routine and outs with follow-up chemistries ordered 8.  Symptomatic left ICA stenosis.  Status post left transcarotid artery revascularization 04/06/2021.  Vascular surgery following 9.  Hypertension.  Norvasc 10 mg daily, Increase HCTZ to 25 mg daily.  Monitor with increased mobility  9/9: increased HCTZ to 37.5mg    9/10 may need another agent   -check bmet Monday Vitals:   05/08/21 1926 05/09/21 0536  BP: (!) 115/55 (!) 155/56  Pulse: 79 72  Resp: 17 17  Temp: 97.8 F (36.6 C) 98.1 F (36.7 C)  SpO2: 100% 98%   Sys elevated consistently reduce HCTZ add ACE I 10.  Diabetes mellitus with hyperglycemia.  Hemoglobin A1c 6.5.  SSI.             -monitor CBG's AC/HS CBG (last 3)  Recent Labs    05/08/21 1626 05/08/21 2050 05/09/21 0609  GLUCAP 135* 129* 154*   Controlled 9/13 11.   Hyperlipidemia.  Continue Lipitor 12.  GERD.  Protonix 13.  CKD stage III.  Baseline creatinine 1.18-1.34.     Creatinine 1.17 on 8/29, stable at 1.08 on 9/5, 1.02 on 9/12 14.  Obesity.  BMI 31.74.  Dietary follow-up 15.  COPD.  Quit smoking  33 years ago.  Check oxygen saturations every shift              -oxygen per [place] at 2L currently  16. Urinary retention with painful caths-continue foley   -flomax on board  9/7  voiding trial in progress no retention, chronic urinary incont, had BSC next to bed PTA, start toileting program , cont [id number]  17.  Left toe bruising in sensate foot, pt does not recall an injury , exam unremarkable check xray  8/24- has nondisplaced acute fracture of L 3rd middle phalanx- will tape to 2nd toe to help.  18. Constipation  Improving 19. Spasticity  Increase tizanidine to 6mg  qhs along with dantrolene will likely need Botox as OP  20. Bowel incontinence-   9/1- pt educated that this is normal with stroke pts- will hopefully improve.  21.  hypoK+ likely due to HCTZ addition will increase KCL to 11/1 BID - wants a "small K+ tablet", has taken K+ supplement at home "99mg " KCL is ~ 400mg  K gluconate, discussed dietary options to increase K+ but most are high in glucose K+ 3.6 on 9/12 cont KCL BID  LOS: 26 days A FACE TO FACE EVALUATION WAS PERFORMED  05/09/2021, 8:26 AM

## 2021-05-09 NOTE — Progress Notes (Signed)
Therapy with pt, reported some confusion noted that is new, pt also incontinent x 2 while in shower. WBC WNL, no temp, pt notes some pain with urination. Notified NP.

## 2021-05-10 LAB — COMPREHENSIVE METABOLIC PANEL
ALT: 19 U/L (ref 0–44)
AST: 21 U/L (ref 15–41)
Albumin: 3.6 g/dL (ref 3.5–5.0)
Alkaline Phosphatase: 80 U/L (ref 38–126)
Anion gap: 9 (ref 5–15)
BUN: 22 mg/dL (ref 8–23)
CO2: 21 mmol/L — ABNORMAL LOW (ref 22–32)
Calcium: 9.6 mg/dL (ref 8.9–10.3)
Chloride: 108 mmol/L (ref 98–111)
Creatinine, Ser: 1.02 mg/dL — ABNORMAL HIGH (ref 0.44–1.00)
GFR, Estimated: >60 mL/min (ref >60)
Glucose, Bld: 177 mg/dL — ABNORMAL HIGH (ref 70–99)
Potassium: 3.5 mmol/L (ref 3.5–5.1)
Sodium: 138 mmol/L (ref 135–145)
Total Bilirubin: 0.7 mg/dL (ref 0.3–1.2)
Total Protein: 6.5 g/dL (ref 6.5–8.1)

## 2021-05-10 LAB — GLUCOSE, CAPILLARY
Glucose-Capillary: 96 mg/dL (ref 70–99)
Glucose-Capillary: 136 mg/dL — ABNORMAL HIGH (ref 70–99)
Glucose-Capillary: 92 mg/dL (ref 70–99)
Glucose-Capillary: 110 mg/dL — ABNORMAL HIGH (ref 70–99)

## 2021-05-10 MED ORDER — LISINOPRIL 10 MG PO TABS
10.0000 mg | ORAL_TABLET | Freq: Every day | ORAL | Status: DC
  Administered 2021-05-11 – 2021-05-12 (×2): 10 mg via ORAL
  Filled 2021-05-10 (×2): qty 1

## 2021-05-10 NOTE — Discharge Summary (Signed)
Physician Discharge Summary  Patient ID: Jade [name] MRN: [id number] DOB/AGE: [birth year] 66 y.o.  Admit date: 04/13/2021 Discharge date: 05/12/2021  Discharge Diagnoses:  Principal Problem:   CVA (cerebral vascular accident) [facility]) Active Problems:   Spastic hemiparesis (HCC)   Stage 3a chronic kidney disease (HCC)   Controlled type 2 diabetes mellitus with hyperglycemia, without long-term current use of insulin (HCC)   Labile blood glucose   Essential hypertension Hyperlipidemia COPD Obesity History of CVA Left carotid artery stenosis status post left transcarotid artery revascularization  Discharged Condition: Stable  Significant Diagnostic Studies: DG Toe 3rd Left  Result Date: 04/18/2021 CLINICAL DATA:  Left third toe bruising. EXAM: LEFT THIRD TOE COMPARISON:  Left foot x-rays dated May 19, 2010. FINDINGS: Acute nondisplaced minimally impacted fracture at the base of the third middle phalanx. No dislocation. Soft tissue swelling of the third toe. IMPRESSION: 1. Acute nondisplaced fracture of the third middle phalanx. Electronically Signed   By: Obie Dredge M.D.   On: 04/18/2021 15:50    Labs:  Basic Metabolic Panel: Recent Labs  Lab 05/08/21 0639 05/10/21 0912  NA 139 138  K 3.6 3.5  CL 108 108  CO2 22 21*  GLUCOSE 101* 177*  BUN 22 22  CREATININE 1.02* 1.02*  CALCIUM 9.3 9.6    CBC: Recent Labs  Lab 05/08/21 0639  WBC 9.9  NEUTROABS 6.8  HGB 11.4*  HCT 34.7*  MCV 98.6  PLT 331    CBG: Recent Labs  Lab 05/10/21 2123 05/11/21 0603 05/11/21 1201 05/11/21 1642 05/11/21 2106  GLUCAP 110* 91 136* 121* 102*   Family history.  Father with heart disease as well as leukemia.  Negative for hypertension colon cancer or esophageal cancer  Brief HPI:   Jade [name] is a 65 y.o. right-handed female with history of CVA COPD CKD stage III hypertension, diabetes mellitus.  Recent admission 03/05/2021 to 03/14/2021 for CVA left inferior thalamic  maintained on aspirin and Plavix.  Per chart review lives with spouse modified independent.  Presented 04/01/2021 with right side weakness.  CT/MRI showed small acute infarct in the left frontal parietal junction.  Additional small focus of acute ischemia adjacent to the frontal horn of the left lateral ventricle with petechial hemorrhage.  Multiple old small vessel infarcts and findings of chronic ischemic microangiopathy.  CT angiogram head and neck severe left ICA origin stenosis due to calcified plaque over 80% 40% narrowing at the right ICA margin.  High-grade left M3 branch stenosis which could be related to atherosclerosis or recent embolic disease.  Admission chemistries unremarkable creatinine 0.96.  Echocardiogram 03/06/2021 showed ejection fraction of 55 to 60% grade 1 diastolic dysfunction.  Vascular surgery follow-up in regards to severe left carotid stenosis underwent left transcarotid artery revascularization 04/06/2021 per Dr. Lenell Antu.  Maintained on aspirin 325 mg daily as well as Plavix x3 months then Plavix alone.  Subcutaneous heparin for DVT prophylaxis.  Therapy evaluations completed due to patient's right side weakness decreased functional mobility was admitted for a comprehensive rehab program.   Hospital Course: Jade [name] was admitted to rehab 04/13/2021 for inpatient therapies to consist of PT, ST and OT at least three hours five days a week. Past admission physiatrist, therapy team and rehab RN have worked together to provide customized collaborative inpatient rehab.  Pertaining to patient's acute infarct left precentral gyrus new B/L PCA small infarcts likely intracranial stenosis in the setting of fluctuating blood pressure remained stable plan was for aspirin 325 mg  daily and Plavix 75 mg day x3 months then Plavix alone.  Initially on Lovenox for DVT prophylaxis discontinued increased mobility and ambulation.  Pain managed use of Neurontin scheduled as well as Flexeril.  Noted  bouts of spasticity related to CVA doing well with the addition of Dantrium as well as Zanaflex.  Blood pressure controlled on present regimen patient would follow-up outpatient primary MD and continued on Norvasc, HCTZ as well as lisinopril..  Noted history of CKD stage III creatinine stable latest 1.02.  Morbid obesity BMI 31.74 dietary follow-up.  Documented history of COPD quit smoking 33 years ago oxygen saturations maintained.  Hemoglobin A1c 6.5 maintained on Amaryl.   Blood pressures were monitored on TID basis and controlled  Diabetes has been monitored with ac/hs CBG checks and SSI was use prn for tighter BS control.    Rehab course: During patient's stay in rehab weekly team conferences were held to monitor patient's progress, set goals and discuss barriers to discharge. At admission, patient required max assist sit to stand total assist stand pivot transfers max assist 1 foot and held assist.  Moderate assist upper body bathing total assist lower body bathing  Physical exam.  Blood pressure 113/93 pulse 94 temperature 98.1 respirations 18 oxygen saturations 97% room air Constitutional.  No acute distress HEENT Head.  Normocephalic and atraumatic Eyes.  Pupils round and reactive to light no discharge without nystagmus Neck.  Supple nontender no JVD without thyromegaly Cardiac regular rate rhythm without extra sounds or murmur heard Abdomen.  Soft nontender positive bowel sounds without rebound Respiratory effort normal no respiratory distress without wheeze Skin.  Left neck incision clean dry and intact with mild bruising Neurologic.  Alert oriented normal insight and awareness.  Cranial nerve exam nonfocal.  Right upper extremity trace pec but 0/5 otherwise.  Right lower extremity trace hip extension but 0/5 otherwise.  Left upper left lower extremity 5/5 normal sensation  He/She  has had improvement in activity tolerance, balance, postural control as well as ability to compensate  for deficits. He/She has had improvement in functional use RUE/LUE  and RLE/LLE as well as improvement in awareness.  Sit to stand rolling walker moderate assist for lifting ambulates with with rolling walker moderate assist.  EOM wheelchair and sit to stand transfers using rolling walker.  Car transfers addressed with family.Antony Salmon was used for transfer to 3-1 over the toilet with max assist for toilet hygiene and clothing management.  She ambulates over to the tub bench with max assist and use of grab bars.  She was able to complete shower with minimal assist for upper body max assist for lower body sit to stand.  Full family teaching completed plan discharged to home       Disposition: Discharge to home    Diet: Carb modified  Special Instructions: No driving smoking or alcohol  Continue aspirin 325 mg daily and Plavix 75 mg day x3 months total then Plavix alone  Medications at discharge 1.  Tylenol as needed 2.  Norvasc 10 mg p.o. daily 3.  Aspirin 325 mg p.o. daily 4.  Lipitor 80 mg p.o. daily 5.  Plavix 75 mg p.o. daily 6.  Dantrium 50 mg p.o. nightly 7.  Voltaren gel 2 g 4 times daily 8.  Colace 100 mg p.o. twice daily 9.  Amaryl 1 mg p.o. daily 10.  HCTZ 12.5 mg p.o. daily 11.  Lisinopril 10 mg p.o. daily 12.  Magnesium oxide 400 mg p.o. daily 13.  MiraLAX  daily hold for loose stools 14  Flomax 0.4 mg daily 15.  Zanaflex 6 mg daily  30-35 minutes were spent completing discharge summary and discharge planning  Discharge Instructions     Ambulatory referral to Neurology   Complete by: As directed    An appointment is requested in approximately: 4 weeks left precentral gyrus/bilateral PCA infarction   Ambulatory referral to Physical Medicine Rehab   Complete by: As directed    Moderate complexity follow-up 1 to 2 weeks left precentral gyrus/B/L PCA infarction        Follow-up Information     Kirsteins, Victorino Sparrow, MD Follow up.   Specialty: Physical Medicine and  Rehabilitation Why: Office to call for appointment Contact information: [address] [place] [address] [place] [address] [phone]                 Signed: Mcarthur Rossetti Nadalie Laughner 05/12/2021, 5:10 AM

## 2021-05-10 NOTE — Patient Care Conference (Signed)
Inpatient RehabilitationTeam Conference and Plan of Care Update Date: 05/10/2021   Time: 10:45 AM    Patient Name: Jade [name]      Medical Record Number: [id number]  Date of Birth: 02-13-1956 Sex: Female         Room/Bed: 4W24C/4W24C-01 Payor Info: Payor: [employer]vertising copywriter MEDICARE / Plan: [employer] MEDICARE / Product Type: *No Product type* /    Admit Date/Time:  04/13/2021  4:00 PM  Primary Diagnosis:  CVA (cerebral vascular accident) [facility])  Hospital Problems: Principal Problem:   CVA (cerebral vascular accident) (HCC) Active Problems:   Spastic hemiparesis (HCC)   Stage 3a chronic kidney disease (HCC)   Controlled type 2 diabetes mellitus with hyperglycemia, without long-term current use of insulin (HCC)   Labile blood glucose   Essential hypertension    Expected Discharge Date: Expected Discharge Date: 05/12/21  Team Members Present: Physician leading conference: Dr. Claudette Laws Social Worker Present: Lavera Guise, BSW Nurse Present: Chana Bode, RN PT Present: Casimiro Needle, PT OT Present: Perrin Maltese, OT SLP Present: Eilene Ghazi, SLP PPS Coordinator present : Fae Pippin, SLP     Current Status/Progress Goal Weekly Team Focus  Bowel/Bladder   Patient is cont/incont of bowel and bladder  Patient will become completely continent of bowel and bladder  Will assess qshift and PRN   Swallow/Nutrition/ Hydration             ADL's   Min assist for UB bathing with mod for UB dressing.  Mod assist for LB bathing with mod to max for LB dressing.  Increased right arm and hand pain with increased flexor tone in the right digits and arm.  Brunnstrum stage III in the right arm and hand approaching level IV.  Mod assist for squat pivots with max assist for stand pivots.  downgraded to mod assist  selfcare retraining, transfer training, neuromuscular re-education, balance retraining, therapeutic activities, pt/family education   Mobility   supine<>sit mod  assist, sit<>stands using RW mod/max assist, squat pivot transfers mod assist and stand pivots using RW heavy max assist, gait up to 56ft using RW with only 1 person mod/max assist for balance and R LE gait mechanics (has strong R lean and excessive R LE adductor tone during swing resulting in scissoring that requries manual assist to reposition) - wheelchair consultation complete  min assist overall and supervision w/c mobility  family education/training, transfer training, R LE NMR, gait training using LRAD, dynamic standing balance, activity tolerance, wheelchair consultation, AFO consultation, DME training   Communication             Safety/Cognition/ Behavioral Observations            Pain   Patient is currently pain free  Patient will remain pain free  Will assess qshift and PRN   Skin   Patient's skin is intact  Patient's skin will remain intact  Will assess qshift and PRN     Discharge Planning:  Pt discharging home with spouse and son. Spouse works during the day, unable to provide 24/7. Son unemployed able to provide 24/7   Team Discussion: Patient continues to struggle with tone and pushing to the right, double/blurred vision. Discussed discharge support and family education for discharge with wheelchair consult for custom light weight chair.  Patient on target to meet rehab goals: yes, currently min assist for upper body bathing and mod assist for dressing. Needs mod - max for lower body care. Needs mod assist for squat pivot and ambulates  up to 20' with mod - max assist.   *See Care Plan and progress notes for long and short-term goals.   Revisions to Treatment Plan:  SLP was discontinued   Teaching Needs: Safety, transfers, medications, secondary risk management, etc.   Current Barriers to Discharge: Decreased caregiver support and Home enviroment access/layout  Possible Resolutions to Barriers: Family education Slide-board for transfers     Medical  Summary Current Status: Blood pressure still on the high side, spasticity interfering with sleep, urinary frequency chronic  Barriers to Discharge: Medical stability   Possible Resolutions to [employer] Focus: Further adjustment of antihypertensive medications as well as spasticity medications, toileting program, caregiver training   Continued Need for Acute Rehabilitation Level of Care: The patient requires daily medical management by a physician with specialized training in physical medicine and rehabilitation for the following reasons: Direction of a multidisciplinary physical rehabilitation program to maximize functional independence : Yes Medical management of patient stability for increased activity during participation in an intensive rehabilitation regime.: Yes Analysis of laboratory values and/or radiology reports with any subsequent need for medication adjustment and/or medical intervention. : Yes   I attest that I was present, lead the team conference, and concur with the assessment and plan of the team.   Chana Bode B 05/10/2021, 11:19 AM

## 2021-05-10 NOTE — Progress Notes (Signed)
Physical Therapy Session Note  Patient Details  Name: Jade [name] MRN: [id number] Date of Birth: Dec 02, 1955  Today's Date: 05/10/2021 PT Individual Time: 2376-2831 and 5176-1607 PT Individual Time Calculation (min): 44 min  and 46 min And  Today's Date: 05/10/2021 PT Missed Time: 14 Minutes Missed Time Reason: Patient fatigue  Short Term Goals: Week 3:  PT Short Term Goal 1 (Week 3): = to LTGs based on ELOS  Skilled Therapeutic Interventions/Progress Updates:    Session 1: Pt received supine in bed and agreeable to therapy session. Supine>sitting L EOB, HOB flat but using bedrail, with mod assist for anterior trunk weight shift and upright - cuing for sequencing and pt able to manage R LE over to and off EOB without physical assistance. Scoot towards EOB with min assist for R hip - repeated cuing for carryover of head/hips relationship. R squat pivot EOB>w/c with mod assist primarily for pivoting hips and for lifting while guarding R knee - cuing for maintaining anterior trunk lean/weightshift. Reports need to use bathroom.   Transported in/out of bathroom in w/c. Stand pivot w/c<>BSC over toilet, no AD, with mod assist for balance and guarding R knee - pt demos ability to take small steps to complete transfer. Sit<>stands BSC over toilet<>RW with heavy mod assist for balance as therapist unable to stand on pt's R side due to lay-out of bathroom - continues to have R lateral lean with R knee flexing due to pt unable ot sustain quad activation and unable to feel (lack of proprioception) to know when knee starts flexing - dependent assist LB clothing management and peri-care.  MD in/out for morning assessment and discussed pt's adductor tone in RLE impairing swing phase gait mechanics. At end of session pt left seated in w/c with needs in reach, chair alarm on, and R UE therapeutically positioned on pillows.    Session 2: Pt received sitting in w/c with her son, [name], present for family  education/training and pt agreeable to therapy session. Pt/family report no specific questions/concerns at this time and state overall they are just discussing long term plans after D/C. Pt's son provided hands-on assistance for all mobility tasks this session for increased practice and training. He set-up w/c for transfer to EOB (set to pt's bed height at home) with min cuing. Son reports he tried partial stand pivot transfers with OT and that these were easier to assist with compared to over-the-back squat pivots from yesterday - pt's son reports he prefers to assist patient in performing stand pivots. Assisted pt with R stand pivot mod assist - demos good set-up of R LE positioning prior to transfer and good ability to assist pt into standing and pivot. Assisted with stand pivot towards L back to w/c with pt demonstrating onset of pushing requiring increased assistance from son to pivot hips into wheelchair safely - educated family on not assisting so quickly to hopefully decrease onset of pt's pushing and allowing easier pivot.  Transported to/from gym in w/c for time management and energy conservation. Pt's son able to set-up simulated car transfer using slide board with min cuing and then completed L lateral scoot into car providing mod assist. Pt's son reports concern that due to the shape of the [employer] that the slide board will not be able to be placed in the proper position therefore requests to attempt stand pivot transfer.  Completed R stand pivot transfer car>w/c with heavy mod assist from pt's son and therapist moving w/c up behind  patient after they pivoted to avoid the need to take a step backwards prior to sitting down. Therapist educated on need for +2 assist to safely complete stand pivot car transfers. Pt reporting fatigue from multiple therapy sessions this afternoon. Transported back to room. Pt's son assisted with sit>stand from the front blocking R knee with mod assist and demonstrating  safe technique just to stand while thearpist adjusted w/c cushion underneath patient. Pt/son report no questions/concerns at this time and pt's son reports feeling comfortable/confident assisting patient upon D/C. Pt's husband arrived during session and reports that he is not able to provide physical assistance due to his own orthopedic limitations. Pt left seated in w/c with needs in reach, chair alarm on, family present, and NT present. Missed 14 minutes of skilled physical therapy.  Therapy Documentation Precautions:  Precautions Precautions: Fall Precaution Comments: RUE hemparesis with increased tone, diplopia Restrictions Weight Bearing Restrictions: No   Pain:  Session 1: Reports decreased R UE pain last night therefore in less pain this morning - premedicated.   Session 2: No reports of pain throughout session.   Therapy/Group: Individual Therapy  Ginny Forth , PT, DPT, NCS, CSRS 05/10/2021, 7:48 AM

## 2021-05-10 NOTE — Progress Notes (Signed)
Patient ID: Jade [name], female   DOB: 04-25-1956, 65 y.o.   MRN: [id number]  Drop arm commode ordered through [employer].  [place], [place] [phone]

## 2021-05-10 NOTE — Progress Notes (Signed)
Physical Therapy Session Note  Patient Details  Name: Jade [name] MRN: [id number] Date of Birth: Nov 17, 1955  Today's Date: 05/10/2021 PT Individual Time: 1418-1450 PT Individual Time Calculation (min): 32 min   Short Term Goals: Week 3:  PT Short Term Goal 1 (Week 3): = to LTGs based on ELOS  Skilled Therapeutic Interventions/Progress Updates:    Patient received seated in wc with son present, hand off from PT, agreeable to PT family ed. Patient and son with no overt concerns at this time. Son able to safely demonstrate stand/squat pivot bed <> wc. Discussed ensuring that patient does not remain on edge of bed, but rather scoots posteriorly to prevent sliding off of bed. Discussed with patient and family use of bedrail to allow patient to more safely assist with transfers, especially bed > wc. Son able to assist patient with sit <> supine using log roll technique. Patient performing bridging in bed to further assist with repositioning once in bed. Discussed purchasing chuck pads- or the equivalent- for use in bed to assist patient with repositioning in bed safely. Patient remaining up in wc, chair alarm on, call light within reach, son at bedside.   Therapy Documentation Precautions:  Precautions Precautions: Fall Precaution Comments: RUE hemparesis with increased tone, diplopia Restrictions Weight Bearing Restrictions: No    Therapy/Group: Individual Therapy  Elizebeth Koller, PT, DPT, CBIS  05/10/2021, 7:55 AM

## 2021-05-10 NOTE — Progress Notes (Signed)
PROGRESS NOTE   Subjective/Complaints:   Discussed orthotics as well as spasticity with PT and pt , adductor tone limiting standing due to reduce base of support no need for AFO given limited amb status , has new leather toe cap   ROS: Patient denies CP, SOB, N/V/D.   Objective:   No results found. Recent Labs    05/08/21 0639  WBC 9.9  HGB 11.4*  HCT 34.7*  PLT 331     Recent Labs    05/08/21 0639  NA 139  K 3.6  CL 108  CO2 22  GLUCOSE 101*  BUN 22  CREATININE 1.02*  CALCIUM 9.3       Intake/Output Summary (Last 24 hours) at 05/10/2021 0838 Last data filed at 05/09/2021 1410 Gross per 24 hour  Intake 120 ml  Output --  Net 120 ml         Physical Exam: Vital Signs Blood pressure (!) 153/65, pulse 73, temperature 98.4 F (36.9 C), resp. rate 19, height _0  (1.549 m), weight 76.2 kg, SpO2 98 %.   General: No acute distress Mood and affect are appropriate Heart: Regular rate and rhythm no rubs murmurs or extra sounds Lungs: Clear to auscultation, breathing unlabored, no rales or wheezes Abdomen: Positive bowel sounds, soft nontender to palpation, nondistended Extremities: No clubbing, cyanosis, or edema  RUE: Elbow flexion/extension 2+/5, stable R lower extremity: Ankle dorsiflexion 1/5 mAS: Right hand 2/4 in finger flexors and thumb flexors  Assessment/Plan: 1. Functional deficits which require 3+ hours per day of interdisciplinary therapy in a comprehensive inpatient rehab setting. Physiatrist is providing close team supervision and 24 hour management of active medical problems listed below. Physiatrist and rehab team continue to assess barriers to discharge/monitor patient progress toward functional and medical goals  Care Tool:  Bathing    Body parts bathed by patient: Chest, Abdomen, Right arm, Right upper leg, Left upper leg, Face, Left lower leg   Body parts bathed by helper:  Front perineal area, Buttocks, Left arm, Right lower leg     Bathing assist Assist Level: Maximal Assistance - Patient 24 - 49%     Upper Body Dressing/Undressing Upper body dressing   What is the patient wearing?: Pull over shirt    Upper body assist Assist Level: Moderate Assistance - Patient 50 - 74%    Lower Body Dressing/Undressing Lower body dressing      What is the patient wearing?: Pants, Incontinence brief     Lower body assist Assist for lower body dressing: Maximal Assistance - Patient 25 - 49%     Toileting Toileting    Toileting assist Assist for toileting: Maximal Assistance - Patient 25 - 49%     Transfers Chair/bed transfer  Transfers assist     Chair/bed transfer assist level: Moderate Assistance - Patient 50 - 74% (squat pivot) Chair/bed transfer assistive device: Programmer, multimedia   Ambulation assist   Ambulation activity did not occur: Safety/medical concerns  Assist level: Moderate Assistance - Patient 50 - 74% Assistive device: Walker-rolling Max distance: 76f   Walk 10 feet activity   Assist  Walk 10 feet activity did not occur: Safety/medical  concerns  Assist level: 2 helpers Assistive device: Other (comment) (wall rail on L, ace wrap on R)   Walk 50 feet activity   Assist Walk 50 feet with 2 turns activity did not occur: Safety/medical concerns         Walk 150 feet activity   Assist Walk 150 feet activity did not occur: Safety/medical concerns         Walk 10 feet on uneven surface  activity   Assist Walk 10 feet on uneven surfaces activity did not occur: Safety/medical concerns         Wheelchair     Assist Is the patient using a wheelchair?: Yes      Wheelchair assist level: Dependent - Patient 0% Max wheelchair distance: 50    Wheelchair 50 feet with 2 turns activity    Assist        Assist Level: Dependent - Patient 0%   Wheelchair 150 feet activity      Assist      Assist Level: Dependent - Patient 0%   Blood pressure (!) 153/65, pulse 73, temperature 98.4 F (36.9 C), resp. rate 19, height _0  (1.549 m), weight 76.2 kg, SpO2 98 %.  Medical Problem List and Plan: 1.  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 Formulized individual treatment plan based on medical history, underlying problem and comorbidities.  2.  Impaired mobility; d/c Lovenox given bleeding from injection site and ambulation>150 feet             -antiplatelet therapy: Continue Aspirin 325 mg daily and Plavix 75 mg daily x3 months then Plavix alone 3. Pain: Continue Neurontin 200 mg nightly, Flexeril as needed  Voltaren gel ordered to left knee on 8/31  Controlled on 9/10. Provided with a pain relief journal.  4. Mood: Provide emotional support             -antipsychotic agents: N/A 5. Neuropsych: This patient is capable of making decisions on her own behalf. 6. Skin/Wound Care: Routine skin checks  8/30-  has a scab for "years' on front of scalp- needs Derm after d/c  7. Fluids/Electrolytes/Nutrition: Routine and outs with follow-up chemistries ordered 8.  Symptomatic left ICA stenosis.  Status post left transcarotid artery revascularization 04/06/2021.  Vascular surgery following 9.  Hypertension.  Norvasc 10 mg daily, Increase HCTZ to 25 mg daily.  Monitor with increased mobility  9/9: increased HCTZ to 37.62m   9/10 may need another agent   -check bmet Monday Vitals:   05/09/21 2046 05/10/21 0551  BP: (!) 144/84 (!) 153/65  Pulse: 82 73  Resp:  18 19  Temp: 98.3 F (36.8 C) 98.4 F (36.9 C)  SpO2: 100% 98%   Sys elevated consistently reduce HCTZ add ACE I 10.  Diabetes mellitus with hyperglycemia.  Hemoglobin A1c 6.5.  SSI.             -monitor CBG's AC/HS CBG (last 3)  Recent Labs    05/09/21 1655 05/09/21 2125 05/10/21 0553  GLUCAP 120* 119* 96   Controlled  9/14 11.  Hyperlipidemia.  Continue Lipitor 12.  GERD.  Protonix 13.  CKD stage III.  Baseline creatinine 1.18-1.34.     Creatinine 1.17 on 8/29, stable at 1.08 on 9/5, 1.02 on 9/12 14.  Obesity.  BMI 31.74.  Dietary follow-up 15.  COPD.  Quit smoking 33 years ago.  Check oxygen saturations every shift              -oxygen per [place] at 2L currently  16. Urinary retention with painful caths-continue foley   -flomax on board  9/7  voiding trial in progress no retention, chronic urinary incont, had BSC next to bed PTA, start toileting program , cont [id number]  17.  Left toe bruising in sensate foot, pt does not recall an injury , exam unremarkable check xray  8/24- has nondisplaced acute fracture of L 3rd middle phalanx- will tape to 2nd toe to help.  18. Constipation  Improving 19. Spasticity  Increase tizanidine to 14m qhs along with dantrolene will likely need Botox as OP , most spastic muscles are elbow and finger flexors as well as R Hip adductors  20. Bowel incontinence-   9/1- pt educated that this is normal with stroke pts- will hopefully improve.  21.  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

## 2021-05-10 NOTE — Progress Notes (Signed)
Occupational Therapy Session Note  Patient Details  Name: Jade [name] MRN: [id number] Date of Birth: [birth year]  Today's Date: 05/10/2021 OT Individual Time: 1301-1420 OT Individual Time Calculation (min): 79 min    Short Term Goals: Week 4:  OT Short Term Goal 1 (Week 4): Pt will continue to work on established LTGs at min to mod assist overall.  Skilled Therapeutic Interventions/Progress Updates:    Pt completed toilet transfers to start session with use of the Stedy by nursing to begin secondary to therapist not being present yet.  Worked on education with pt's son [name] during therapy for toilet transfers and toileting tasks.  Therapist assisted with sit to stand from the toilet with mod assist and then had her work on completion of toilet hygiene at the same level in standing as well as clothing management.  Increased pushing to the right was noted in standing.  She was able to complete stand pivot transfer to the right for return to the wheelchair at max assist level.  Had pt's son work on squat pivot transfers from the drop arm commode to the wheelchair at min assist to the right and mod assist to the left secondary to pushing to the right when attempting to pivot.  Also had him work on assisting her with standing for simulated clothing management at mod assist.  Recommended placement of her drop arm commode at home where she will have something she can place her LUE on while standing to help increase weightshift to the left and decrease pushing.  He will work on positioning of this at home based on layout.  Also had him assist with sit to stand at the sink for simulated LB clothing management as well, which he was able to return demonstrate safely.  Issued handout at the end of the session with HEP for the RUE.  Did not get a chance to fully review but expressed the need to work on all movements in the handout slowly and only in small ROM where pt's pain is minimal.  Will review further  next visit.  She was left with her son and with the PT in the room for next session.    Therapy Documentation Precautions:  Precautions Precautions: Fall Precaution Comments: RUE hemparesis with increased tone, diplopia Restrictions Weight Bearing Restrictions: No  Pain: Pain Assessment Pain Scale: Faces Faces Pain Scale: Hurts a little bit ADL: See Care Tool    Therapy/Group: Individual Therapy  Teighlor Korson OTR/L 05/10/2021, 3:23 PM

## 2021-05-11 ENCOUNTER — Other Ambulatory Visit (HOSPITAL_COMMUNITY): Payer: Self-pay

## 2021-05-11 LAB — GLUCOSE, CAPILLARY
Glucose-Capillary: 91 mg/dL (ref 70–99)
Glucose-Capillary: 136 mg/dL — ABNORMAL HIGH (ref 70–99)
Glucose-Capillary: 121 mg/dL — ABNORMAL HIGH (ref 70–99)
Glucose-Capillary: 102 mg/dL — ABNORMAL HIGH (ref 70–99)

## 2021-05-11 MED ORDER — PANTOPRAZOLE SODIUM 40 MG PO TBEC
40.0000 mg | DELAYED_RELEASE_TABLET | Freq: Every day | ORAL | 0 refills | Status: DC
  Filled 2021-05-11: qty 30, 30d supply, fill #0

## 2021-05-11 MED ORDER — DANTROLENE SODIUM 50 MG PO CAPS
50.0000 mg | ORAL_CAPSULE | Freq: Every day | ORAL | 0 refills | Status: DC
  Filled 2021-05-11: qty 30, 30d supply, fill #0

## 2021-05-11 MED ORDER — VITAMIN B COMPLEX PO TABS
ORAL_TABLET | Freq: Every day | ORAL | 0 refills | Status: DC
  Filled 2021-05-11: qty 30, 30d supply, fill #0

## 2021-05-11 MED ORDER — ATORVASTATIN CALCIUM 80 MG PO TABS
80.0000 mg | ORAL_TABLET | Freq: Every day | ORAL | 0 refills | Status: DC
  Filled 2021-05-11: qty 30, 30d supply, fill #0

## 2021-05-11 MED ORDER — CLOPIDOGREL BISULFATE 75 MG PO TABS
75.0000 mg | ORAL_TABLET | Freq: Every day | ORAL | 0 refills | Status: AC
  Filled 2021-05-11: qty 30, 30d supply, fill #0

## 2021-05-11 MED ORDER — MAGNESIUM OXIDE 400 MG PO TABS
800.0000 mg | ORAL_TABLET | Freq: Every day | ORAL | 0 refills | Status: DC
  Filled 2021-05-11: qty 60, 30d supply, fill #0

## 2021-05-11 MED ORDER — AMLODIPINE BESYLATE 10 MG PO TABS
10.0000 mg | ORAL_TABLET | Freq: Every day | ORAL | 0 refills | Status: DC
  Filled 2021-05-11: qty 30, 30d supply, fill #0

## 2021-05-11 MED ORDER — GLIMEPIRIDE 1 MG PO TABS
1.0000 mg | ORAL_TABLET | Freq: Every day | ORAL | 0 refills | Status: DC
  Filled 2021-05-11: qty 30, 30d supply, fill #0

## 2021-05-11 MED ORDER — TIZANIDINE HCL 2 MG PO TABS
6.0000 mg | ORAL_TABLET | ORAL | 0 refills | Status: DC
  Filled 2021-05-11: qty 90, 30d supply, fill #0

## 2021-05-11 MED ORDER — HYDROCHLOROTHIAZIDE 12.5 MG PO TABS
12.5000 mg | ORAL_TABLET | Freq: Every day | ORAL | 0 refills | Status: DC
  Filled 2021-05-11: qty 30, 30d supply, fill #0

## 2021-05-11 MED ORDER — LISINOPRIL 10 MG PO TABS
10.0000 mg | ORAL_TABLET | Freq: Every day | ORAL | 0 refills | Status: DC
  Filled 2021-05-11: qty 30, 30d supply, fill #0

## 2021-05-11 MED ORDER — DICLOFENAC SODIUM 1 % EX GEL
2.0000 g | Freq: Four times a day (QID) | CUTANEOUS | 0 refills | Status: DC
  Filled 2021-05-11: qty 100, 7d supply, fill #0

## 2021-05-11 MED ORDER — CHOLECALCIFEROL 125 MCG (5000 UT) PO TABS
5000.0000 [IU] | ORAL_TABLET | Freq: Every day | ORAL | 0 refills | Status: DC
  Filled 2021-05-11: qty 30, 30d supply, fill #0

## 2021-05-11 MED ORDER — TAMSULOSIN HCL 0.4 MG PO CAPS
0.4000 mg | ORAL_CAPSULE | Freq: Every day | ORAL | 0 refills | Status: DC
  Filled 2021-05-11: qty 30, 30d supply, fill #0

## 2021-05-11 NOTE — Progress Notes (Signed)
Patient ID: Jade [name], female   DOB: May 13, 1956, 65 y.o.   MRN: [id number]  Pt declined by [facility] due to insurance.   [place], [place] [phone]

## 2021-05-11 NOTE — Progress Notes (Signed)
Physical Therapy Session Note  Patient Details  Name: Jade [name] MRN: [id number] Date of Birth: Jan 15, 1956  Today's Date: 05/11/2021 PT Individual Time: 0900-1008 PT Individual Time Calculation (min): 68 min   Short Term Goals: Week 3:  PT Short Term Goal 1 (Week 3): = to LTGs based on ELOS  Skilled Therapeutic Interventions/Progress Updates:   Pt received supine in bed and agreeable to therapy session. Supine>sitting L EOB, HOB flat but using bedrail, with min assist for rotating trunk towards L and maintaining anterior trunk lean/weight shift while pt uses L UE to push trunk upright. L partial stand pivot transfer EOB>w/c with mod assist primarily for pivoting hips and blocking R knee while stepping L LE. Transported in/out of bathroom in w/c.  Stand pivot w/c<>BSC over toilet with mod assist for pivoting hips and blocking R knee while stepping L LE - pt unable to step R LE laterally when transferring towards the R requiring therapist to manually move it for her. Sit>stands from [facility] over toilet>RW with heavy mod assist for balance due to therapist being unable to guard on pt's R side because of the layout of the bathroom - tolerates standing ~20-30seconds with repeated verbal cuing to extend R knee as pt unable to feel (lack of proprioception) when R knee starts to flex and pt unable to maintain quad activation for >5 seconds without gradual movement into flexed posture causing strong R anterior lean - dependent assist LB clothing management and peri-care while standing requiring multiple stands in order to complete these tasks.  Donned B LE TED hose and shoes total assist for time management. Sitting in w/c performed oral care with hand-over-hand assist to integrate R UE into task to hold objects. Sit<>stand at sink with L UE support with min assist.  Initiated w/c proulsion training using L hemi-technique (removed Roho cushion during this to allow improved floor-to-seat height) with pt  requiring min assist initially progressed to supervision to achieve ~22ft of straight forward movement - requires 32push strokes and 62min42secs to navigate 5ft - pt demos very inefficient push-strokes at this time with difficulty timing L UE push with L LE pull - cuing throughout for improvement.  At end of session pt left seated in w/c with needs in reach, R UE therapeutically positioned on pillows, and chair alarm on.  Therapy Documentation Precautions:  Precautions Precautions: Fall Precaution Comments: RUE hemparesis with increased tone, diplopia Restrictions Weight Bearing Restrictions: No   Pain:  Reports some increased R UE shoulder pain this morning due to not having Voltaren gel applied to it last night. Continues to have chronic L knee pain.    Therapy/Group: Individual Therapy  Ginny Forth , PT, DPT, NCS, CSRS 05/11/2021, 7:42 AM

## 2021-05-11 NOTE — Progress Notes (Signed)
Patient ID: Jade [name], female   DOB: Jun 03, 1956, 66 y.o.   MRN: [id number]  Pt referral sent to [facility] for review  Lavera Guise, [place] [phone]

## 2021-05-11 NOTE — Progress Notes (Signed)
Physical Therapy Discharge Summary  Patient Details  Name: Jade [name] MRN: [id number] Date of Birth: 1956/04/11  Today's Date: 05/11/2021 PT Individual Time: 1694-5038 PT Individual Time Calculation (min): 61 min    Patient has met 3 of 9 long term goals due to improved activity tolerance, improved balance, improved postural control, increased strength, ability to compensate for deficits, functional use of  right upper extremity and right lower extremity, improved attention, and improved awareness.  Patient to discharge at a wheelchair level requiring Min/Mod Assist for functional transfers. Patient's care partner is independent to provide the necessary physical and cognitive assistance at discharge.  Reasons goals not met: Pt continues to require min assist for dynamic sitting balance and up to max assist for dynamic standing balance tasks. Pt continues to require mod assist for bed<>chair squat pivot transfers. Pt requires max assist from skilled therapist during gait training using RW. Pt is able to propel ultralightweight wheelchair up to 169f but has not yet progressed to 1545f.   Recommendation:  Patient will benefit from ongoing skilled PT services in home health setting to continue to advance safe functional mobility, address ongoing impairments in trunk control, transfer training, R LE NMR, dynamic standing balance, gait training, wheelchair management, and minimize fall risk.  Equipment: Custom wheelchair through [employer]. Pt already has RW therefore R UE RW orthotic provided.  Reasons for discharge: treatment goals met and discharge from hospital  Patient/family agrees with progress made and goals achieved: Yes  Skilled Therapeutic Interventions/Progress Updates:  Pt received sitting in w/c with her son, JaCorene Corneapresent for family education/training and pt's husband, JoJacqulyn Batharriving shortly after. Pt agreeable to therapy session. Pt sitting in her loaner w/c from [employer].-  therapist readjusted R UE armrest for improved shoulder alignment/positioning otherwise loaner chair with good fit. Pt's son has no questions regarding w/c part management. Pt's son denies any questions/concerns regarding assisting pt with functional mobility at home. Reinforced education with pt on how to perform L hemi-technique w/c propulsion - pt propelled 10022ftarting with min assist progressed to supervision and primarily using L LE as pt with difficulty coordinating push with L UE and pull with L LE. Pt demonstrates significant improvement in propulsion efficiency and energy conservation propelling in this ultralightweight wheelchair compared to the lightweight one from this AM, which allows further propulsion distance and increased pt independence with this task. Propels [id number] 1min21meconds utilizing only 23 push strokes, which is a significant improvement. Transported remainder of distance to therapy gym.  Pt's son able to correctly set-up for L squat/stand pivot transfer w/c>EOM without cuing from therapist including managing w/c parts and positioning of w/c. Pt's son assisted with L stand pivot w/c>EOM providing mod assist with appropriate positioning and safe assistance all without cuing from therapist. Assisted with R squat pivot EOM>w/c with mod assist again without any cuing from therapist and demonstrating proper set-up and positioning to assist patient.  With therapist's assistance, patient participated in [employer] Financialdemonstrates high increased fall risk as noted by score of 4/56.  (<36= high risk for falls, close to 100%; 37-45 significant >80%; 46-51 moderate >50%; 52-55 lower >25%).   Educated pt/son on the following exercises to be performed as HEP. Focused on educating family on safe set-up of sit<>stand transfers with pt utilizing stable surface for L UE support during this task to target B LE strengthening and for pressure relief from sitting - pt's son demonstrates safe  assistance 3x assisting with sit<>stands from the  front to lift into standing then transitioning to standing on pt's R side once in standing for balance support - min progressed to no cuing needed from therapist.  Access Code: [email or web address] URL: [email or web address] Date: 05/11/2021 Prepared by: Page Spiro  Exercises Supine Bridge - 1 x daily - 7 x weekly - 2 sets - 15 reps Supine Heel Slide - 1 x daily - 7 x weekly - 2 sets - 10 reps Sit to Stand with Counter Support - 1 x daily - 7 x weekly - 2 sets - 5 reps Seated Long Arc Quad - 1 x daily - 7 x weekly - 2 sets - 10 reps Seated March - 1 x daily - 7 x weekly - 2 sets - 10 reps Seated Hip Adduction Isometrics with Ball - 1 x daily - 7 x weekly - 2 sets - 10 reps Seated Hip Abduction with Resistance - 1 x daily - 7 x weekly - 2 sets - 10 reps  Pt/family report no questions/concerns and report feeling confident/comfortable with performing functional mobility at home from wheelchair level requiring assistance with transfers. At end of session, pt left seated in w/c with family present and NT present to assist with hygiene.   PT Discharge Precautions/Restrictions Precautions Precautions: Fall;Other (comment) Precaution Comments: RUEand RLE hemparesis with increased tone Restrictions Weight Bearing Restrictions: No Pain Pain Assessment Pain Scale: 0-10 Pain Score: 5  Pain Type: Acute pain;Neuropathic pain Pain Location: Hand Pain Orientation: Right Pain Descriptors / Indicators: Aching;Sharp;Tender;Shooting Pain Onset: On-going (and increases with activity) Pain Intervention(s): Rest;Repositioned;Relaxation;Distraction;Medication (See eMAR) Multiple Pain Sites: Yes 2nd Pain Site Pain Score: 7 Pain Type: Chronic pain Pain Location: Knee Pain Orientation: Left;Anterior Pain Descriptors / Indicators: Aching;Stabbing Pain Onset: On-going Pain Intervention(s): Medication (See eMAR);Rest;Repositioned;Emotional  support;Distraction;Other (Comment) (ACE wrap during standing/gait training) Pain Interference Pain Interference Pain Effect on Sleep: 3. Frequently Pain Interference with Therapy Activities: 1. Rarely or not at all Pain Interference with Day-to-Day Activities: 1. Rarely or not at all Vision/Perception  Vision - History Ability to See in Adequate Light: 1 Impaired Vision - Assessment Eye Alignment: Within Functional Limits Ocular Range of Motion: Restricted looking up Alignment/Gaze Preference: Within Defined Limits Tracking/Visual Pursuits: Decreased smoothness of horizontal tracking;Decreased smoothness of vertical tracking Convergence: Within functional limits Diplopia Assessment: Other (comment) (Not present on discharge but she states that it comes and goes.) Additional Comments: Pt with blurry vision as well as intermittent diplopia per her report. Perception Perception: Impaired Spatial Orientation: impaired midline orientation with pusher tendencies towards the R with standing/transfers Praxis Praxis: Impaired Praxis Impairment Details: Motor planning Praxis-Other Comments: motor impersistence  Cognition Overall Cognitive Status: History of cognitive impairments - at baseline Arousal/Alertness: Awake/alert Orientation Level: Oriented to person;Oriented to place;Oriented to situation;Disoriented to time Year: 2020 Month: August (initially stated July then August then September) Day of Week: Incorrect Attention: Focused;Sustained Focused Attention: Appears intact Sustained Attention: Appears intact Memory: Impaired Safety/Judgment: Appears intact Sensation Sensation Light Touch: Impaired Detail Peripheral sensation comments: baseline peripheral neuropathy Light Touch Impaired Details: Impaired RLE Hot/Cold: Appears Intact Proprioception: Impaired Detail Proprioception Impaired Details: Impaired RUE;Impaired RLE Stereognosis: Not tested Additional Comments: Pt with  impaired light touch in the hand as well as impaired proprioception from the wrist distally to the fingers. Coordination Gross Motor Movements are Fluid and Coordinated: No Fine Motor Movements are Fluid and Coordinated: No Coordination and Movement Description: Impaired gross motor movements in R LE due to paresis (greater distally but significant throughout) and hypertonia most prominent in  adductors causing scissoring Motor  Motor Motor: Hemiplegia;Abnormal tone;Abnormal postural alignment and control;Motor impersistence Motor - Discharge Observations: RUE and RLE hemiparesis with increased tone noted in the UE at the elbow and digits and in the LE primarily in adductors  Mobility Bed Mobility Bed Mobility: Supine to Sit;Sit to Supine Supine to Sit: Moderate Assistance - Patient 50-74% Sit to Supine: Moderate Assistance - Patient 50-74% Transfers Transfers: Sit to Stand;Stand to Sit;Squat Pivot Transfers Sit to Stand: Moderate Assistance - Patient 50-74% Stand to Sit: Moderate Assistance - Patient 50-74% Stand Pivot Transfers: Moderate Assistance - Patient 50 - 74%;Maximal Assistance - Patient 25 - 49% Stand Pivot Transfer Details: Manual facilitation for weight shifting;Manual facilitation for weight bearing;Manual facilitation for placement;Verbal cues for technique;Verbal cues for sequencing;Tactile cues for sequencing;Tactile cues for weight shifting;Tactile cues for posture;Tactile cues for weight beaing;Visual cues/gestures for sequencing Squat Pivot Transfers: Moderate Assistance - Patient 50-74% Transfer (Assistive device): None Locomotion  Gait Ambulation: Yes Gait Assistance: Moderate Assistance - Patient 50-74% Gait Distance (Feet): 20 Feet Assistive device: Rolling walker;Other (Comment) (R hand orthosis on RW) Gait Assistance Details: Verbal cues for safe use of DME/AE;Tactile cues for weight shifting;Tactile cues for placement;Tactile cues for sequencing;Tactile cues for  posture;Tactile cues for initiation;Verbal cues for sequencing;Verbal cues for gait pattern;Verbal cues for technique;Verbal cues for precautions/safety;Manual facilitation for weight shifting;Manual facilitation for weight bearing;Manual facilitation for placement;Other (comment) Gait Assistance Details: requires manual assist to reposition R LE after swing due to adductor tone causing scissoring and requires tactile cuing during stance to increase knee extension/quad activation - requires assist throughout to maintain upright due to strong R lean - requires repeated cuing to maintain trunk/hip extension for upright posture Gait Gait: Yes Gait Pattern: Impaired Gait Pattern: Step-through pattern;Decreased stance time - right;Decreased stride length;Decreased hip/knee flexion - right;Poor foot clearance - right;Lateral trunk lean to right;Right flexed knee in stance Gait velocity: significantly decreased Stairs / Additional Locomotion Stairs: No Wheelchair Mobility Wheelchair Mobility: Yes Wheelchair Assistance: Supervision/Verbal cueing Wheelchair Propulsion: Left lower extremity;Left upper extremity Wheelchair Parts Management: Needs assistance Distance: 153f  Trunk/Postural Assessment  Cervical Assessment Cervical Assessment: Ex[facility] to [facility]) Thoracic Assessment Thoracic Assessment: Exceptions to [facility](significant thoracic rounding and kyphosis) Lumbar Assessment Lumbar Assessment: Exceptions to [facility](significant posterior pelvic tilt) Postural Control Postural Control: Deficits on evaluation Righting Reactions: Still with increased pusher tendencies to the right with occasional LOB during functional trasks when unsupported.  Balance Balance Balance Assessed: Yes Standardized Balance Assessment Standardized Balance Assessment: Berg Balance Test Berg Balance Test Sit to Stand: Needs moderate or maximal assist to stand Standing Unsupported: Unable to stand 30  seconds unassisted Sitting with Back Unsupported but Feet Supported on Floor or Stool: Able to sit 2 minutes under supervision Stand to Sit: Needs assistance to sit Transfers: Needs one person to assist Standing Unsupported with Eyes Closed: Needs help to keep from falling Standing Ubsupported with Feet Together: Needs help to attain position and unable to hold for 15 seconds From Standing, Reach Forward with Outstretched Arm: Loses balance while trying/requires external support From Standing Position, Pick up Object from Floor: Unable to try/needs assist to keep balance From Standing Position, Turn to Look Behind Over each Shoulder: Needs assist to keep from losing balance and falling Turn 360 Degrees: Needs assistance while turning Standing Unsupported, Alternately Place Feet on Step/Stool: Needs assistance to keep from falling or unable to try Standing Unsupported, One Foot in Front: Loses balance while stepping or standing Standing on One Leg:  Unable to try or needs assist to prevent fall Total Score: 4 Static Sitting Balance Static Sitting - Balance Support: No upper extremity supported;Feet supported Static Sitting - Level of Assistance: 5: Stand by assistance Dynamic Sitting Balance Dynamic Sitting - Balance Support: Feet unsupported;During functional activity Dynamic Sitting - Level of Assistance: 4: Min assist Static Standing Balance Static Standing - Balance Support: During functional activity;Bilateral upper extremity supported Static Standing - Level of Assistance: 3: Mod assist Dynamic Standing Balance Dynamic Standing - Balance Support: During functional activity;Left upper extremity supported Dynamic Standing - Level of Assistance: 2: Max assist Extremity Assessment     RLE Assessment RLE Assessment: Exceptions to [facility] RLE Strength RLE Overall Strength: Deficits Right Hip Flexion: 2+/5 Right Hip Extension: 2+/5 Right Hip ABduction: 2-/5 Right Hip ADduction:  2/5 Right Knee Flexion: 3-/5 Right Knee Extension: 3-/5 Right Ankle Dorsiflexion: 2-/5 Right Ankle Plantar Flexion: 2-/5 RLE Tone RLE Tone: Hypertonic;Mild RLE Tone Comments: adductor tone LLE Assessment LLE Assessment: Within Functional Limits Active Range of Motion (AROM) Comments: WFL - hx of chronic L knee pain General Strength Comments: grossly 4+/5 to 5/5 throughout    Tawana Scale , PT, DPT, NCS, CSRS 05/11/2021, 7:44 AM

## 2021-05-11 NOTE — Progress Notes (Signed)
PROGRESS NOTE   Subjective/Complaints:  No issues overnight except pain in RIgh thand no trauma to area , wearing hand splint, no hypersensitivity or swelling noted   ROS: Patient denies CP, SOB, N/V/D.   Objective:   No results found. No results for input(s): WBC, HGB, HCT, PLT in the last 72 hours.   Recent Labs    05/10/21 0912  NA 138  K 3.5  CL 108  CO2 21*  GLUCOSE 177*  BUN 22  CREATININE 1.02*  CALCIUM 9.6       Intake/Output Summary (Last 24 hours) at 05/11/2021 0824 Last data filed at 05/11/2021 0752 Gross per 24 hour  Intake 310 ml  Output --  Net 310 ml         Physical Exam: Vital Signs Blood pressure (!) 162/71, pulse 80, temperature 98.1 F (36.7 C), temperature source Oral, resp. rate 16, height 5\' 1"  (1.549 m), weight 76.2 kg, SpO2 98 %.  General: No acute distress Mood and affect are appropriate Heart: Regular rate and rhythm no rubs murmurs or extra sounds Lungs: Clear to auscultation, breathing unlabored, no rales or wheezes Abdomen: Positive bowel sounds, soft nontender to palpation, nondistended Extremities: No clubbing, cyanosis, or edema, no erythema R hand  Skin: No evidence of breakdown, no evidence of rash, no skin hypersensitivity on R hand  RUE: Elbow flexion/extension 2+/5, stable R lower extremity: Ankle dorsiflexion 1/5 mAS: Right hand 2/4 in finger flexors and thumb flexors MSK- no joint swelling in R hand pain with ROM , no jt deformities  Assessment/Plan: 1. Functional deficits which require 3+ hours per day of interdisciplinary therapy in a comprehensive inpatient rehab setting. Physiatrist is providing close team supervision and 24 hour management of active medical problems listed below. Physiatrist and rehab team continue to assess barriers to discharge/monitor patient progress toward functional and medical goals  Care Tool:  Bathing    Body parts bathed by  patient: Chest, Abdomen, Right arm, Right upper leg, Left upper leg, Face, Left lower leg   Body parts bathed by helper: Front perineal area, Buttocks, Left arm, Right lower leg     Bathing assist Assist Level: Maximal Assistance - Patient 24 - 49%     Upper Body Dressing/Undressing Upper body dressing   What is the patient wearing?: Pull over shirt    Upper body assist Assist Level: Moderate Assistance - Patient 50 - 74%    Lower Body Dressing/Undressing Lower body dressing      What is the patient wearing?: Pants, Incontinence brief     Lower body assist Assist for lower body dressing: Maximal Assistance - Patient 25 - 49%     Toileting Toileting    Toileting assist Assist for toileting: Maximal Assistance - Patient 25 - 49%     Transfers Chair/bed transfer  Transfers assist     Chair/bed transfer assist level: Moderate Assistance - Patient 50 - 74% Chair/bed transfer assistive device:   Ambulation assist   Ambulation activity did not occur: Safety/medical concerns  Assist level: Moderate Assistance - Patient 50 - 74% Assistive device: Walker-rolling Max distance: 30ft   Walk 10 feet activity  Assist  Walk 10 feet activity did not occur: Safety/medical concerns  Assist level: 2 helpers Assistive device: Other (comment) (wall rail on L, ace wrap on R)   Walk 50 feet activity   Assist Walk 50 feet with 2 turns activity did not occur: Safety/medical concerns         Walk 150 feet activity   Assist Walk 150 feet activity did not occur: Safety/medical concerns         Walk 10 feet on uneven surface  activity   Assist Walk 10 feet on uneven surfaces activity did not occur: Safety/medical concerns         Wheelchair     Assist Is the patient using a wheelchair?: Yes      Wheelchair assist level: Dependent - Patient 0% Max wheelchair distance: 50    Wheelchair 50 feet with 2 turns  activity    Assist        Assist Level: Dependent - Patient 0%   Wheelchair 150 feet activity     Assist      Assist Level: Dependent - Patient 0%   Blood pressure (!) 162/71, pulse 80, temperature 98.1 F (36.7 C), temperature source Oral, resp. rate 16, height 5\' 1"  (1.549 m), weight 76.2 kg, SpO2 98 %.  Medical Problem List and Plan: 1.  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  Impaired mobility; d/c Lovenox given bleeding from injection site and ambulation>150 feet             -antiplatelet therapy: Continue Aspirin 325 mg daily and Plavix 75 mg daily x3 months then Plavix alone 3. Pain: Right hand  spasticity related pain, cont tizanidine and dantrolene, LFTs ok, will likely need Botox as OP   Voltaren gel ordered to left knee on 8/31  Controlled on 9/10. Provided with a pain relief journal.  4. Mood: Provide emotional support             -antipsychotic agents: N/A 5. Neuropsych: This patient is capable of making decisions on her own behalf. 6. Skin/Wound Care: Routine skin checks  8/30-  has a scab for "years' on front of scalp- needs Derm after d/c  7. Fluids/Electrolytes/Nutrition: Routine and outs with follow-up chemistries ordered 8.  Symptomatic left ICA stenosis.  Status post left transcarotid artery revascularization 04/06/2021.  Vascular surgery following 9.  Hypertension.  Norvasc 10 mg daily, Increase HCTZ to 25 mg daily.  Monitor with increased mobility  9/9: increased HCTZ to 37.5mg    9/10 may need another agent   -check bmet Monday Vitals:   05/10/21 1943 05/11/21 0601  BP: (!) 152/80 (!) 162/71  Pulse: 84 80  Resp: 18 16  Temp: 98.1 F (36.7 C) 98.1 F (36.7 C)  SpO2: 100% 98%    Sys elevated consistently reduce HCTZ add ACE I starting lisinopril 10mg  qd on 9/15 10.  Diabetes mellitus with hyperglycemia.  Hemoglobin A1c 6.5.  SSI.             -monitor CBG's AC/HS CBG (last 3)  Recent Labs    05/10/21 1720 05/10/21 2123 05/11/21 0603  GLUCAP 92 110* 91   Controlled 9/15 11.  Hyperlipidemia.  Continue Lipitor 12.  GERD.  Protonix 13.  CKD stage III.  Baseline creatinine 1.18-1.34.     Creatinine 1.17 on 8/29, stable at 1.08 on 9/5, 1.02 on 9/12 14.  Obesity.  BMI 31.74.  Dietary follow-up 15.  COPD.  Quit smoking 33 years ago.  Check oxygen saturations every shift              -oxygen per [place] at 2L currently  16. Urinary retention with painful caths-continue foley   -flomax on board  9/7  voiding trial in progress no retention, chronic urinary incont, had BSC next to bed PTA, start toileting program , cont [id number]  17.  Left toe bruising in sensate foot, pt does not recall an injury , exam unremarkable check xray  8/24- has nondisplaced acute fracture of L 3rd middle phalanx- will tape to 2nd toe to help.  18. Constipation  Improving 19. Spasticity  Increase tizanidine to 6mg  qhs along with dantrolene will likely need Botox as OP , most spastic muscles are elbow and finger flexors as well as R Hip adductors  20. Bowel incontinence-   9/1- pt educated that this is normal with stroke pts- will hopefully improve.  21.  hypoK+ likely due to HCTZ addition will increase KCL to 11/1 BID - wants a "small K+ tablet", has taken K+ supplement at home "99mg " KCL is ~ 400mg  K gluconate, discussed dietary options to increase K+ but most are high in glucose K+ 3.6 on 9/12 cont KCL BID, 9/14 K+ 3.5 stable   LOS: 28 days A FACE TO FACE EVALUATION WAS PERFORMED  11/12 05/11/2021, 8:24 AM

## 2021-05-11 NOTE — Progress Notes (Signed)
Occupational Therapy Discharge Summary  Patient Details  Name: Jade [name] MRN: [id number] Date of Birth: Jan 11, 1956  Today's Date: 05/11/2021 OT Individual Time: 2542-7062 OT Individual Time Calculation (min): 56 min   Session Note:  Pt worked on shower and dressing during session with use of the [place] for transfer to the shower from the wheelchair secondary to decreased time.  She was able to complete sit to stand on the [place] with mod assist.  Removal of UB clothing from the shower bench was completed at min assist level while LB clothing required max assist.  She was able to complete UB bathing at min assist level and LB bathing at mod sit to stand.  Mod assist for stand pivot transfer to the right to the wheelchair in order to complete dressing tasks at the sink.  Mod assist to donn a pullover shirt with max assist for donning all LB clothing following hemi dressing techniques.  Mod assist for sit to stand transitions at the sink with mod demonstrational cueing for scooting forward and for placement of the RLE.  She was able to comb her hair with setup using the LUE.  She was left sitting in the wheelchair at end of session with the RUE supported on pillows and elbow protector in place.  Call button and phone in reach as well with safety belt in place.  Issued bed positioning handout as well for reference at home.    Patient has met 9 of 13 long term goals due to improved activity tolerance, improved balance, postural control, ability to compensate for deficits, functional use of  RIGHT upper and RIGHT lower extremity, and improved coordination.  Patient to discharge at overall Mod Assist level.  Patient's care partner is independent to provide the necessary physical and cognitive assistance at discharge.    Reasons goals not met: Pt still reports intermittent diplopia as well as needing min assist for dynamic sitting balance.  She continues to need mod assist for use of the RUE as a gross  assist for selfcare tasks.    Recommendation:  Patient will benefit from ongoing skilled OT services in home health setting to continue to advance functional skills in the area of BADL and Reduce care partner burden.  Pt will continue to benefit from [facility] to help increase overall transfer and ADL function to at least supervision level/min assist as well as increasing RUE functional use and movement with minimal pain.    Equipment: Drop arm commode  Reasons for discharge: treatment goals met and discharge from hospital  Patient/family agrees with progress made and goals achieved: Yes  OT Discharge Precautions/Restrictions  Precautions Precautions: Fall Precaution Comments: RUEand RLE hemparesis with increased tone Falls, RUE and RLE hemiparesis  Pain   See pain flowsheet for details ADL ADL Eating: Set up Where Assessed-Eating: Wheelchair Grooming: Supervision/safety Where Assessed-Grooming: Wheelchair Upper Body Bathing: Minimal assistance Where Assessed-Upper Body Bathing: Chair, Multimedia programmer Lower Body Bathing: Moderate assistance Where Assessed-Lower Body Bathing: Chair Upper Body Dressing: Moderate assistance Where Assessed-Upper Body Dressing: Wheelchair Lower Body Dressing: Maximal assistance Where Assessed-Lower Body Dressing: Wheelchair Toileting: Moderate assistance Where Assessed-Toileting: Bedside Commode Toilet Transfer: Moderate assistance Toilet Transfer Method: Squat pivot Toilet Transfer Equipment: Drop arm bedside commode Tub/Shower Transfer: Not assessed Social research officer, government: Moderate assistance Social research officer, government Method: Education officer, environmental: Gaffer Baseline Vision/History: 1 Wears glasses Wears Glasses: At all times Patient Visual Report: Diplopia (not present with gross testing on discharge, but she reports  that is comes and goes) Vision Assessment?: Yes Eye Alignment: Within Functional Limits Ocular Range of  Motion: Restricted looking up Alignment/Gaze Preference: Within Defined Limits Tracking/Visual Pursuits: Decreased smoothness of horizontal tracking;Decreased smoothness of vertical tracking Convergence: Within functional limits Visual Fields: No apparent deficits Diplopia Assessment: Other (comment) (Not present on discharge but she states that it comes and goes.) Additional Comments: Pt with blurry vision as well as intermittent diplopia per her report. Perception  Perception: Impaired Spatial Orientation: Pusher tendencies to the right with transitional movements and standing. Praxis Praxis: Impaired Praxis Impairment Details: Motor planning Praxis-Other Comments: motor impersistence Cognition Overall Cognitive Status: Impaired/Different from baseline Arousal/Alertness: Awake/alert Year: 2020 Month: September Day of Week: Correct Attention: Focused;Sustained Focused Attention: Appears intact Sustained Attention: Appears intact Selective Attention: Impaired Selective Attention Impairment: Functional basic Memory: Impaired Memory Impairment: Storage deficit;Decreased recall of new information Immediate Memory Recall: Sock;Blue;Bed Memory Recall Sock: Without Cue Memory Recall Blue: With Cue Memory Recall Bed: With Cue Awareness: Appears intact Problem Solving: Impaired Problem Solving Impairment: Functional basic;Functional complex Safety/Judgment: Appears intact Comments: Pt still with some intermittent confusion when trying to distinguish between her son and spouse with regards to their work history. Sensation Sensation Light Touch: Impaired Detail Light Touch Impaired Details: Impaired RLE Hot/Cold: Appears Intact Proprioception: Impaired Detail Proprioception Impaired Details: Impaired RUE Stereognosis: Not tested Additional Comments: Pt with impaired light touch in the hand as well as impaired proprioception from the wrist distally to the  fingers. Coordination Gross Motor Movements are Fluid and Coordinated: No Fine Motor Movements are Fluid and Coordinated: No Coordination and Movement Description: Pt with Brunnstrum stage III movement in the right arm and hand but with increased tone as well.  currently needs min assist to incorporate as a stabilizer with max assist for use to wash the LUE.  Increased pain throughout the arm and hand as well. Motor  Motor Motor: Hemiplegia;Abnormal tone;Abnormal postural alignment and control;Motor impersistence Motor - Discharge Observations: RUE and RLE hemiparesis with increased tone noted in the UE at the elbow and digits. Mobility  Bed Mobility Bed Mobility: Supine to Sit Supine to Sit: Moderate Assistance - Patient 50-74% Transfers Sit to Stand: Moderate Assistance - Patient 50-74% Stand to Sit: Moderate Assistance - Patient 50-74%  Trunk/Postural Assessment  Cervical Assessment Cervical Assessment: Exceptions to [facility] (cervical protraction) Thoracic Assessment Thoracic Assessment: Exceptions to [facility] (thoracic rounding and kyphosis) Lumbar Assessment Lumbar Assessment: Exceptions to [facility] (posterior pelvic tilt) Postural Control Righting Reactions: Still with increased pusher tendencies to the right with occasional LOB during functional trasks when unsupported.  Balance Balance Balance Assessed: Yes Static Sitting Balance Static Sitting - Balance Support: No upper extremity supported;Feet supported Static Sitting - Level of Assistance: 5: Stand by assistance Dynamic Sitting Balance Dynamic Sitting - Balance Support: Feet unsupported;During functional activity Dynamic Sitting - Level of Assistance: 4: Min assist Static Standing Balance Static Standing - Balance Support: During functional activity;Bilateral upper extremity supported Static Standing - Level of Assistance: 3: Mod assist Dynamic Standing Balance Dynamic Standing - Balance Support: During functional  activity Dynamic Standing - Level of Assistance: 2: Max assist Extremity/Trunk Assessment RUE Assessment RUE Assessment: Exceptions to [facility] Passive Range of Motion (PROM) Comments: shoulder flexion 0-120 degrees with limitations secondary to pain and increased tone.  Elbow flexion/extension WFLs,  Wrist flexion/extension with increased flexor tone but tolerates extension up to approximately 70 degrees.  Finger flexor tone present as well. Active Range of Motion (AROM) Comments: Brunnstrum stage III in the arm  and hand with increased tone noted in the flexors of the elbow, wrist,  and digits.  Also with increased tone in the shoulder extensors and internal rotators.  Uses the UE as a stabilizer with min assist but needs max assist for integration into bathing tasks. RUE Tone RUE Tone: Moderate;Modified Ashworth Body Part - Modified Ashworth Scale: Fingers;Elbow;Wrist Elbow - Modified Ashworth Scale for Grading Hypertonia RUE: More marked increase in muscle tone through most of the ROM, but affected part(s) easily moved Wrist - Modified Ashworth Scale for Grading Hypertonia RUE: More marked increase in muscle tone through most of the ROM, but affected part(s) easily moved Fingers - Modified Ashworth Scale for Grading Hypertonia RUE: Considerable increase in muschle tone, passive movement difficult LUE Assessment LUE Assessment: Within Functional Limits General Strength Comments: strength at least 3+/5 for functional use with selfcare tasks   Keydi Giel OTR/L 05/11/2021, 4:35 PM

## 2021-05-11 NOTE — Progress Notes (Signed)
Inpatient Rehabilitation Care Coordinator Discharge Note   Patient Details  Name: LANGLEY FLATLEY MRN: [id number] Date of Birth: [birth year]   Discharge location: D/C home  Length of Stay: 29 Days  Discharge activity level: Mod A  Home/community participation: Son able to assist in home. Family education complete  Patient response HT:DSKAJG Literacy - How often do you need to have someone help you when you read instructions, pamphlets, or other written material from your doctor or pharmacy?: Never  Patient response OT:LXBWIO Isolation - How often do you feel lonely or isolated from those around you?: Never  Services provided included: MD, RD, PT, SLP, OT, RN, CM, TR, Pharmacy, Neuropsych, SW  Financial Services:  Field seismologist Utilized: Media planner [employer]  Choices offered to/list presented to: Pt (awaiting approval)  Follow-up services arranged:  Home Health Home Health Agency: Awaiting insurance approval   [facility]      Patient response to transportation need: Is the patient able to respond to transportation needs?: Yes In the past 12 months, has lack of transportation kept you from medical appointments or from getting medications?: No In the past 12 months, has lack of transportation kept you from meetings, work, or from getting things needed for daily living?: No    Comments (or additional information):  Patient/Family verbalized understanding of follow-up arrangements:  Yes  Individual responsible for coordination of the follow-up plan: pt.  [phone]  Confirmed correct DME delivered: Andria Rhein 05/11/2021    Andria Rhein

## 2021-05-11 NOTE — Plan of Care (Signed)
  Problem: RH Balance Goal: LTG Patient will maintain dynamic sitting balance (PT) Description: LTG:  Patient will maintain dynamic sitting balance with assistance during mobility activities (PT) Outcome: Not Met (add Reason) Goal: LTG Patient will maintain dynamic standing balance (PT) Description: LTG:  Patient will maintain dynamic standing balance with assistance during mobility activities (PT) Outcome: Not Met (add Reason)   Problem: RH Bed to Chair Transfers Goal: LTG Patient will perform bed/chair transfers w/assist (PT) Description: LTG: Patient will perform bed to chair transfers with assistance (PT). Outcome: Not Met (add Reason)   Problem: RH Ambulation Goal: LTG Patient will ambulate in controlled environment (PT) Description: LTG: Patient will ambulate in a controlled environment, # of feet with assistance (PT). Outcome: Not Met (add Reason) Goal: LTG Patient will ambulate in home environment (PT) Description: LTG: Patient will ambulate in home environment, # of feet with assistance (PT). Outcome: Not Met (add Reason)   Problem: RH Wheelchair Mobility Goal: LTG Patient will propel w/c in controlled environment (PT) Description: LTG: Patient will propel wheelchair in controlled environment, # of feet with assist (PT) Outcome: Not Met (add Reason)   Problem: RH Bed Mobility Goal: LTG Patient will perform bed mobility with assist (PT) Description: LTG: Patient will perform bed mobility with assistance, with/without cues (PT). Outcome: Completed/Met   Problem: RH Car Transfers Goal: LTG Patient will perform car transfers with assist (PT) Description: LTG: Patient will perform car transfers with assistance (PT). Outcome: Completed/Met   Problem: RH Wheelchair Mobility Goal: LTG Patient will propel w/c in home environment (PT) Description: LTG: Patient will propel wheelchair in home environment, # of feet with assistance (PT). Outcome: Completed/Met

## 2021-05-12 ENCOUNTER — Other Ambulatory Visit (HOSPITAL_COMMUNITY): Payer: Self-pay

## 2021-05-12 LAB — GLUCOSE, CAPILLARY: Glucose-Capillary: 100 mg/dL — ABNORMAL HIGH (ref 70–99)

## 2021-05-12 MED ORDER — MAGNESIUM OXIDE -MG SUPPLEMENT 400 (240 MG) MG PO TABS
400.0000 mg | ORAL_TABLET | Freq: Every day | ORAL | 0 refills | Status: DC
  Filled 2021-05-12: qty 30, 30d supply, fill #0

## 2021-05-12 NOTE — Plan of Care (Signed)
  Problem: RH Vision Goal: RH LTG Vision (Specify) Outcome: Completed/Met

## 2021-05-12 NOTE — Progress Notes (Signed)
Patient ID: JYL CHICO, female   DOB: September 09, 1955, 65 y.o.   MRN: [id number]  Pt referral sent to [facility].  [place], [place] [phone]

## 2021-05-12 NOTE — Progress Notes (Signed)
PROGRESS NOTE   Subjective/Complaints:  No issues overnite , looking forward to d/c  Discussed visual issues and need for optho f/u , also rec not to get new corrective lenses for a couple more months to allow for further post CVA recovery   ROS: Patient denies CP, SOB, N/V/D.   Objective:   No results found. No results for input(s): WBC, HGB, HCT, PLT in the last 72 hours.   Recent Labs    05/10/21 0912  NA 138  K 3.5  CL 108  CO2 21*  GLUCOSE 177*  BUN 22  CREATININE 1.02*  CALCIUM 9.6       Intake/Output Summary (Last 24 hours) at 05/12/2021 0852 Last data filed at 05/12/2021 0700 Gross per 24 hour  Intake 240 ml  Output --  Net 240 ml         Physical Exam: Vital Signs Blood pressure (!) 162/75, pulse 76, temperature 97.8 F (36.6 C), resp. rate 18, height 5\' 1"  (1.549 m), weight 76.2 kg, SpO2 100 %.   General: No acute distress Mood and affect are appropriate Heart: Regular rate and rhythm no rubs murmurs or extra sounds Lungs: Clear to auscultation, breathing unlabored, no rales or wheezes Abdomen: Positive bowel sounds, soft nontender to palpation, nondistended Extremities: No clubbing, cyanosis, or edema Skin: No evidence of breakdown, no evidence of rash   Skin: No evidence of breakdown, no evidence of rash, no skin hypersensitivity on R hand  RUE: Elbow flexion/extension 2+/5, stable R lower extremity: Ankle dorsiflexion 1/5 mAS: Right hand 2/4 in finger flexors and thumb flexors MSK- no joint swelling in R hand pain with ROM , no jt deformities  Assessment/Plan: 1. Functional deficits which require 3+ hours per day of interdisciplinary therapy in a comprehensive inpatient rehab setting. Physiatrist is providing close team supervision and 24 hour management of active medical problems listed below. Physiatrist and rehab team continue to assess barriers to discharge/monitor patient  progress toward functional and medical goals  Care Tool:  Bathing    Body parts bathed by patient: Chest, Abdomen, Right arm, Right upper leg, Left upper leg, Face, Left lower leg   Body parts bathed by helper: Front perineal area, Buttocks, Left arm, Right lower leg     Bathing assist Assist Level: Moderate Assistance - Patient 50 - 74%     Upper Body Dressing/Undressing Upper body dressing   What is the patient wearing?: Pull over shirt    Upper body assist Assist Level: Moderate Assistance - Patient 50 - 74%    Lower Body Dressing/Undressing Lower body dressing      What is the patient wearing?: Pants, Incontinence brief     Lower body assist Assist for lower body dressing: Maximal Assistance - Patient 25 - 49%     Toileting Toileting    Toileting assist Assist for toileting: Moderate Assistance - Patient 50 - 74%     Transfers Chair/bed transfer  Transfers assist     Chair/bed transfer assist level: Moderate Assistance - Patient 50 - 74% Chair/bed transfer assistive device:   Ambulation assist   Ambulation activity did not occur: Safety/medical concerns  Assist  level: Moderate Assistance - Patient 50 - 74% Assistive device: Walker-rolling Max distance: 65ft   Walk 10 feet activity   Assist  Walk 10 feet activity did not occur: Safety/medical concerns  Assist level: Moderate Assistance - Patient - 50 - 74% Assistive device: Walker-rolling   Walk 50 feet activity   Assist Walk 50 feet with 2 turns activity did not occur: Safety/medical concerns         Walk 150 feet activity   Assist Walk 150 feet activity did not occur: Safety/medical concerns         Walk 10 feet on uneven surface  activity   Assist Walk 10 feet on uneven surfaces activity did not occur: Safety/medical concerns         Wheelchair     Assist Is the patient using a wheelchair?: Yes Type of Wheelchair: Manual     Wheelchair assist level: Set up assist, Supervision/Verbal cueing Max wheelchair distance: 50    Wheelchair 50 feet with 2 turns activity    Assist        Assist Level: Dependent - Patient 0%   Wheelchair 150 feet activity     Assist      Assist Level: Dependent - Patient 0%   Blood pressure (!) 162/75, pulse 76, temperature 97.8 F (36.6 C), resp. rate 18, height 5\' 1"  (1.549 m), weight 76.2 kg, SpO2 100 %.  Medical Problem List and Plan: 1.  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  Impaired mobility; d/c Lovenox given bleeding from injection site and ambulation>150 feet             -antiplatelet therapy: Continue Aspirin 325 mg daily and Plavix 75 mg daily x3 months then Plavix alone 3. Pain: Right hand  spasticity related pain, cont tizanidine and dantrolene, LFTs ok, will likely need Botox as OP   Voltaren gel ordered to left knee on 8/31  Controlled on 9/10. Provided with a pain relief journal.  4. Mood: Provide emotional support             -antipsychotic agents: N/A 5. Neuropsych: This patient is capable of making decisions on her own behalf. 6. Skin/Wound Care: Routine skin checks  8/30-  has a scab for "years' on front of scalp- needs Derm after d/c  7. Fluids/Electrolytes/Nutrition: Routine and outs with follow-up chemistries ordered 8.  Symptomatic left ICA stenosis.  Status post left transcarotid artery revascularization 04/06/2021.  f/u [facility] 9.  Hypertension.  Norvasc 10 mg daily, Increase HCTZ to 25 mg daily.  Monitor with increased mobility  9/9: increased HCTZ to 37.5mg    9/10 may need another agent   -check bmet Monday Vitals:   05/11/21 2017 05/12/21 0557  BP: (!) 135/59  (!) 162/75  Pulse: 80 76  Resp: 18 18  Temp: 98.4 F (36.9 C) 97.8 F (36.6 C)  SpO2: 98% 100%   Sys elevated consistently reduce HCTZ add ACE I starting lisinopril 10mg  qd on 9/15- f/u with PCP 10.  Diabetes mellitus with hyperglycemia.  Hemoglobin A1c 6.5.  SSI.             -monitor CBG's AC/HS CBG (last 3)  Recent Labs    05/11/21 1642  05/11/21 2106 05/12/21 0557  GLUCAP 121* 102* 100*   Controlled 9/16 11.  Hyperlipidemia.  Continue Lipitor 12.  GERD.  Protonix 13.  CKD stage III.  Baseline creatinine 1.18-1.34.     Creatinine 1.17 on 8/29, stable at 1.08 on 9/5, 1.02 on 9/12 14.  Obesity.  BMI 31.74.  Dietary follow-up 15.  COPD.  Quit smoking 33 years ago.  Check oxygen saturations every shift              -oxygen per [place] at 2L currently  16. Urinary retention resolved 9/7  voiding trial in progress no retention, chronic urinary incont, had BSC next to bed PTA, start toileting program , off myrbetriq  17.  Left toe bruising in sensate foot, pt does not recall an injury , exam unremarkable check xray  8/24- has nondisplaced acute fracture of L 3rd middle phalanx- will tape to 2nd toe to help.  18. Constipation  Improving 19. Spasticity  Increase tizanidine to 6mg  qhs along with dantrolene will likely need Botox as OP , most spastic muscles are elbow and finger flexors as well as R Hip adductors  20. Bowel incontinence-   9/1- pt educated that this is normal with stroke pts- will hopefully improve.  21.  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

## 2021-05-12 NOTE — Progress Notes (Signed)
Patient ID: Jade [name], female   DOB: 09/28/1955, 65 y.o.   MRN: [id number]  Pt referral sent to [facility].  [place], [place] [phone]

## 2021-05-12 NOTE — Progress Notes (Addendum)
Patient ID: Jade [name], female   DOB: [birth year], 65 y.o.   MRN: [id number]  Pt referral sent to [facility].  [place], [place] [phone]

## 2021-05-12 NOTE — Progress Notes (Signed)
Patient ID: Jade [name], female   DOB: 1955/09/29, 65 y.o.   MRN: [id number]  Pt declined by [facility]  [place], [place] [phone]

## 2021-05-12 NOTE — Progress Notes (Signed)
Patient ID: Jade [name], female   DOB: 15-Feb-1956, 65 y.o.   MRN: [id number]  Pt referral sent to [facility].  [place], [place] [phone]

## 2021-05-12 NOTE — Progress Notes (Signed)
Patient discharged to home with husband. Patient received discharge medication from pharmacy. Patient and husband stated that they understood discharge instructions and had no questions.  Cletis Media, LPN

## 2021-05-12 NOTE — Progress Notes (Signed)
Recreational Therapy Discharge Summary Patient Details  Name: Jade [name] MRN: [id number] Date of Birth: April 14, 1956 Today's Date: 05/12/2021 Comments on progress toward goals: Pt participated in TR services with an emphasis on pt education in regards to activity analysis/modifications and more heavily on stress management and coping skills.  Pt participated in group session identifying types of stressors, ways to protect against stress and potential coping strategies at supervision level.  Educational handout provided.  Pt is discharging home today with son to provide 24 hour supervision/assistance. Reasons for discharge: discharge from hospital Patient/family agrees with progress made and goals achieved: Yes  [name] 05/12/2021, 8:29 AM

## 2021-05-12 NOTE — Progress Notes (Signed)
Patient ID: Jade [name], female   DOB: November 04, 1955, 65 y.o.   MRN: [id number]  Pt declined by [facility].   [place], [place] [phone]

## 2021-05-12 NOTE — Progress Notes (Signed)
Patient ID: Jade [name], female   DOB: 25-May-1956, 65 y.o.   MRN: [id number]  Pt referral sent to [facility].  [place], [place] [phone]

## 2021-05-12 NOTE — Progress Notes (Signed)
Patient ID: Jade [name], female   DOB: June 07, 1956, 65 y.o.   MRN: [id number]  Patient accepted by [facility].[address]  [place], [place] [phone]

## 2021-05-16 ENCOUNTER — Encounter (HOSPITAL_COMMUNITY): Payer: Medicare Other

## 2021-05-17 ENCOUNTER — Telehealth: Payer: Self-pay

## 2021-05-17 NOTE — Telephone Encounter (Signed)
Patient called stating she is having severe pain in her right arm and hand. She states she was given pain medication in the hospital but was not discharged with it. She has been taking Tylenol and using Voltaren Gel but that has not been working. Follow up appt not until 06/05/21

## 2021-05-18 MED ORDER — TRAMADOL HCL 50 MG PO TABS: 50.0000 mg | ORAL_TABLET | Freq: Two times a day (BID) | ORAL | 0 refills | Status: AC | PRN

## 2021-05-18 NOTE — Telephone Encounter (Signed)
Patients son has left a voicemail on clinic line that he called yesterday several times about her medication.  States no one has called him back.  He left this message at 12:07pm on 05/18/21.  Patient has been out of pain medication for over 48hrs.  He would like a call back today.

## 2021-05-18 NOTE — Telephone Encounter (Signed)
I spoke with Jade [name] about her prescription for tramadol  sent to [facility] by Dr Wynn Banker. She took something in the hospital that made her sick but I did not see any tramadol on list of hospital medications. I advised if problems to call us tomorrow earlier in the day rather than around 3-4:30 to be sure someone will be here (MD) to address.

## 2021-05-18 NOTE — Telephone Encounter (Signed)
Patient is taking Dantrolene and Zanaflex as directed along with Tylenol and using Voltaren Gel still no relieve. Please advise.

## 2021-06-01 ENCOUNTER — Other Ambulatory Visit: Payer: Self-pay

## 2021-06-01 ENCOUNTER — Telehealth: Payer: Self-pay | Admitting: Pharmacist

## 2021-06-01 NOTE — Telephone Encounter (Signed)
Pharmacy Transitions of Care Follow-up Telephone Call  Date of discharge: 05/12/2021  Discharge Diagnosis: history of CVA  How have you been since you were released from the hospital? I spoke with Ms.Zartman' son regarding her health and care since discharge. She is still in a lot of pain and experiencing side effects from her medications.  She has diarrhea and is unsteady on her foot.  She has not been taking her medications as prescribed.  Medication changes made at discharge:  - START: Clopidogrel  - STOPPED: n/a  - CHANGED: n/a  Medication changes verified by the patient? yes    Medication Accessibility:  Home Pharmacy: [facility]   Was the patient provided with refills on discharged medications? No, but patient has enough medication to last until follow up appointment.   Have all prescriptions been transferred from [facility] to home pharmacy? N/a   Is the patient able to afford medications? yes Notable copays: n/a Eligible patient assistance:n/a    Medication Review: CLOPIDOGREL (PLAVIX) Clopidogrel 75 mg once daily.  - Educated patient on expected duration of therapy of 3 months with clopidogrel.  - Reviewed potential DDIs with patient  - Advised patient of medications to avoid (NSAIDs, ASA)  - Educated that Tylenol (acetaminophen) will be the preferred analgesic to prevent risk of bleeding  - Emphasized importance of monitoring for signs and symptoms of bleeding (abnormal bruising, prolonged bleeding, nose bleeds, bleeding from gums, discolored urine, black tarry stools)  - Advised patient to alert all providers of anticoagulation therapy prior to starting a new medication or having a procedure   Follow-up Appointments:  Specialist and PCP Hospital f/u appt confirmed? yes Scheduled to see Dr.Kirsteins on 10/10.   If their condition worsens, is the pt aware to call PCP or go to the Emergency Dept.? yes  Final Patient Assessment: I spoke with Ms.Heatherington' son  regarding her health and care since discharge. She is still in a lot of pain and experiencing side effects from her medications.  She has diarrhea and is unsteady on her foot.  She has not been taking her medications as prescribed.  I reiterated to the son the importance of her cardiovascular medications.  I reiterated the need to take daily.  I suggested following up with her provider on Monday 06/05/21 regarding how the medications make her feel and have a list of questions and issues prepared for the visit.

## 2021-06-05 ENCOUNTER — Encounter: Payer: Medicare Other | Attending: Registered Nurse | Admitting: Registered Nurse

## 2021-06-12 ENCOUNTER — Other Ambulatory Visit: Payer: Self-pay | Admitting: Physical Medicine & Rehabilitation

## 2021-06-14 ENCOUNTER — Other Ambulatory Visit: Payer: Self-pay

## 2021-06-14 ENCOUNTER — Encounter (HOSPITAL_BASED_OUTPATIENT_CLINIC_OR_DEPARTMENT_OTHER): Payer: Self-pay | Admitting: Obstetrics and Gynecology

## 2021-06-14 ENCOUNTER — Emergency Department (HOSPITAL_BASED_OUTPATIENT_CLINIC_OR_DEPARTMENT_OTHER)
Admission: EM | Admit: 2021-06-14 | Discharge: 2021-06-14 | Disposition: A | Discharge status: Home / Self Care | Payer: Medicare Other | Source: Home / Self Care | Attending: Emergency Medicine | Admitting: Emergency Medicine

## 2021-06-14 ENCOUNTER — Inpatient Hospital Stay: Payer: Medicare Other | Admitting: Neurology

## 2021-06-14 DIAGNOSIS — I129 Hypertensive chronic kidney disease with stage 1 through stage 4 chronic kidney disease, or unspecified chronic kidney disease: Secondary | ICD-10-CM | POA: Insufficient documentation

## 2021-06-14 DIAGNOSIS — Z7984 Long term (current) use of oral hypoglycemic drugs: Secondary | ICD-10-CM | POA: Insufficient documentation

## 2021-06-14 DIAGNOSIS — Z87891 Personal history of nicotine dependence: Secondary | ICD-10-CM | POA: Insufficient documentation

## 2021-06-14 DIAGNOSIS — J449 Chronic obstructive pulmonary disease, unspecified: Secondary | ICD-10-CM | POA: Insufficient documentation

## 2021-06-14 DIAGNOSIS — R531 Weakness: Secondary | ICD-10-CM | POA: Diagnosis not present

## 2021-06-14 DIAGNOSIS — I10 Essential (primary) hypertension: Secondary | ICD-10-CM

## 2021-06-14 DIAGNOSIS — N183 Chronic kidney disease, stage 3 unspecified: Secondary | ICD-10-CM | POA: Insufficient documentation

## 2021-06-14 DIAGNOSIS — Z760 Encounter for issue of repeat prescription: Secondary | ICD-10-CM | POA: Insufficient documentation

## 2021-06-14 DIAGNOSIS — E1122 Type 2 diabetes mellitus with diabetic chronic kidney disease: Secondary | ICD-10-CM | POA: Insufficient documentation

## 2021-06-14 DIAGNOSIS — Z7982 Long term (current) use of aspirin: Secondary | ICD-10-CM | POA: Insufficient documentation

## 2021-06-14 DIAGNOSIS — I6340 Cerebral infarction due to embolism of unspecified cerebral artery: Secondary | ICD-10-CM | POA: Diagnosis not present

## 2021-06-14 LAB — CBC WITH DIFFERENTIAL/PLATELET
Abs Immature Granulocytes: 0.02 10*3/uL (ref 0.00–0.07)
Basophils Absolute: 0.1 10*3/uL (ref 0.0–0.1)
Basophils Relative: 1 %
Eosinophils Absolute: 0.2 10*3/uL (ref 0.0–0.5)
Eosinophils Relative: 2 %
HCT: 44 % (ref 36.0–46.0)
Hemoglobin: 14.5 g/dL (ref 12.0–15.0)
Immature Granulocytes: 0 %
Lymphocytes Relative: 26 %
Lymphs Abs: 2.2 10*3/uL (ref 0.7–4.0)
MCH: 32.1 pg (ref 26.0–34.0)
MCHC: 33 g/dL (ref 30.0–36.0)
MCV: 97.3 fL (ref 80.0–100.0)
Monocytes Absolute: 0.7 10*3/uL (ref 0.1–1.0)
Monocytes Relative: 8 %
Neutro Abs: 5.5 10*3/uL (ref 1.7–7.7)
Neutrophils Relative %: 63 %
Platelets: 370 10*3/uL (ref 150–400)
RBC: 4.52 MIL/uL (ref 3.87–5.11)
RDW: 13.1 % (ref 11.5–15.5)
WBC: 8.7 10*3/uL (ref 4.0–10.5)
nRBC: 0 % (ref 0.0–0.2)

## 2021-06-14 LAB — BASIC METABOLIC PANEL
Anion gap: 11 (ref 5–15)
BUN: 20 mg/dL (ref 8–23)
CO2: 26 mmol/L (ref 22–32)
Calcium: 10 mg/dL (ref 8.9–10.3)
Chloride: 105 mmol/L (ref 98–111)
Creatinine, Ser: 0.88 mg/dL (ref 0.44–1.00)
GFR, Estimated: >60 mL/min (ref >60)
Glucose, Bld: 146 mg/dL — ABNORMAL HIGH (ref 70–99)
Potassium: 3.6 mmol/L (ref 3.5–5.1)
Sodium: 142 mmol/L (ref 135–145)

## 2021-06-14 MED ORDER — AMLODIPINE BESYLATE 10 MG PO TABS: 10.0000 mg | ORAL_TABLET | Freq: Every day | ORAL | 0 refills | Status: DC

## 2021-06-14 MED ORDER — AMLODIPINE BESYLATE 5 MG PO TABS
10.0000 mg | ORAL_TABLET | Freq: Once | ORAL | Status: AC
  Administered 2021-06-14: 10 mg via ORAL
  Filled 2021-06-14: qty 2

## 2021-06-14 MED ORDER — HYDRALAZINE HCL 10 MG PO TABS: 10.0000 mg | ORAL_TABLET | Freq: Once | ORAL | Status: DC

## 2021-06-14 MED ORDER — HYDRALAZINE HCL 25 MG PO TABS
25.0000 mg | ORAL_TABLET | Freq: Once | ORAL | Status: AC
  Administered 2021-06-14: 25 mg via ORAL
  Filled 2021-06-14: qty 1

## 2021-06-14 MED ORDER — ATORVASTATIN CALCIUM 80 MG PO TABS: 80.0000 mg | ORAL_TABLET | Freq: Every day | ORAL | 0 refills | Status: DC

## 2021-06-14 MED ORDER — HYDROCHLOROTHIAZIDE 25 MG PO TABS
12.5000 mg | ORAL_TABLET | Freq: Once | ORAL | Status: AC
  Administered 2021-06-14: 12.5 mg via ORAL
  Filled 2021-06-14: qty 1

## 2021-06-14 MED ORDER — HYDROCHLOROTHIAZIDE 12.5 MG PO TABS: 12.5000 mg | ORAL_TABLET | Freq: Every day | ORAL | 0 refills | Status: DC

## 2021-06-14 NOTE — ED Provider Notes (Signed)
MEDCENTER [facility] EMERGENCY DEPT Provider Note   CSN: [id number] Arrival date & time: 06/14/21  1949     History Chief Complaint  Patient presents with   Medication Refill    Jade [name] is a 65 y.o. female.  Presenting to the emergency room with concern for medication refill.  Patient was discharged from the hospital approximately 1 month ago after having a stroke and having some residual right-sided deficits.  Patient reports that she has been taking her medication as prescribed except for lisinopril.  States that she does not want to take this due to concern for possibility of side effects that she has not actually had any side effects from this medicine.  Reports that a few days ago she ran out of her some of her medications.  Requesting refill on her blood pressure medications.  She denies any chest pain, difficulty in breathing, new numbness, or new weakness.  Reports that she had issues following up with her primary care doctor and thinks that her original primary care doctor has left the area.  HPI     Past Medical History:  Diagnosis Date   Arthritis    "back, knees, some in my left hip" (03/21/2018)   Chronic kidney disease    "was told I had 25% kidney function in early 2012" (03/21/2018)   COPD (chronic obstructive pulmonary disease) (HCC)    CVA (cerebral vascular accident) (HCC) 03/21/2018   "numb all over; head to toe" (03/21/2018)   Fibromyalgia    History of gout    Hyperlipidemia    Hypertension    Migraine    "used to get them monthly during menopause" (03/21/2018)   Neuromuscular disorder (HCC)    OSA (obstructive sleep apnea)    "don't tolerate the machine" (03/21/2018)   Pneumonia ~ 2007   Type 2 diabetes, diet controlled [facility])     Patient Active Problem List   Diagnosis Date Noted   Labile blood glucose    Essential hypertension    Spastic hemiparesis (HCC)    Stage 3a chronic kidney disease (HCC)    Controlled type 2 diabetes mellitus  with hyperglycemia, without long-term current use of insulin (HCC)    CVA (cerebral vascular accident) (HCC) 04/13/2021   TIA (transient ischemic attack) 04/02/2021   Carotid artery stenosis with cerebral infarction (HCC) 04/02/2021   COPD (chronic obstructive pulmonary disease) (HCC) 04/02/2021   Stroke-like symptoms 03/05/2021   Acute CVA (cerebrovascular accident) (HCC) 03/21/2018   Obesity, Class III, BMI 40-49.9 (morbid obesity) (HCC) 03/21/2018   IBS (irritable bowel syndrome) 04/12/2015   Hair loss 10/20/2014   Primary osteoarthritis of left knee 09/29/2014   Visit for screening mammogram 08/30/2014   Routine general medical examination at a health care facility 08/30/2014   Tinea corporis 12/21/2013   Essential hypertension, benign 12/21/2013   Low back pain 08/24/2013   Patient noncompliant with statin medication 02/23/2013   H/O abnormal Pap smear 10/24/2012   Osteopenia 09/26/2012   Diabetic neuropathy, painful (HCC) 05/26/2012   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   Pure hypercholesterolemia 03/20/2012   Chronic venous insufficiency 03/20/2012    Past Surgical History:  Procedure Laterality Date   HERNIA REPAIR     LAPAROSCOPIC CHOLECYSTECTOMY  06/2007   "no UHR w/this" (03/21/2018)   TONSILLECTOMY     TRANSCAROTID ARTERY REVASCULARIZATION  Left 04/06/2021   Procedure: LEFT TRANSCAROTID ARTERY REVASCULARIZATION;  Surgeon: Leonie Douglas, MD;  Location: [facility] OR;  Service: Vascular;  Laterality: Left;   ULTRASOUND GUIDANCE FOR VASCULAR ACCESS Right 04/06/2021   Procedure: ULTRASOUND GUIDANCE FOR VASCULAR ACCESS;  Surgeon: Leonie Douglas, MD;  Location: [facility] OR;  Service: Vascular;  Laterality: Right;   UMBILICAL HERNIA REPAIR  2009     OB History     Gravida  3   Para  1   Term  1   Preterm      AB  2   Living  1      SAB  2   IAB      Ectopic      Multiple      Live Births  1           Family History   Problem Relation Age of Onset   Heart disease Father    Leukemia Father    Alcohol abuse Son    Other Mother    Cancer Neg Hx    Diabetes Neg Hx    Hearing loss Neg Hx    Hyperlipidemia Neg Hx    Hypertension Neg Hx    Kidney disease Neg Hx    Stroke Neg Hx     Social History   Tobacco Use   Smoking status: Former    Packs/day: 1.25    Years: 16.00    Pack years: 20.00    Types: Cigarettes    Quit date: 08/28/1987    Years since quitting: 33.8   Smokeless tobacco: Never  Vaping Use   Vaping Use: Never used  Substance Use Topics   Alcohol use: Not Currently   Drug use: Not Currently    Types: Marijuana    Home Medications Prior to Admission medications   Medication Sig Start Date End Date Taking? Authorizing Provider  acetaminophen (TYLENOL) 325 MG tablet Take 2 tablets (650 mg total) by mouth every 6 (six) hours as needed for mild pain or headache (or temp > 37.5 C (99.5 F)). 04/13/21   Pokhrel, Rebekah Chesterfield, MD  amLODipine (NORVASC) 10 MG tablet Take 1 tablet (10 mg total) by mouth daily. 06/14/21 07/14/21  Milagros Loll, MD  aspirin EC 325 MG EC tablet Take 1 tablet (325 mg total) by mouth daily. 04/14/21 07/13/21  Pokhrel, Rebekah Chesterfield, MD  atorvastatin (LIPITOR) 80 MG tablet Take 1 tablet (80 mg total) by mouth daily. 06/14/21 07/14/21  Milagros Loll, MD  B Complex Vitamins (VITAMIN B COMPLEX) TABS Take 1 tabet by mouth once daily. 05/11/21   Angiulli, Mcarthur Rossetti, PA-C  Cholecalciferol 125 MCG (5000 UT) TABS Take 1 tablet (5,000 Units total) by mouth daily. 05/11/21   Angiulli, Mcarthur Rossetti, PA-C  Coenzyme Q10 (COQ-10) 100 MG CAPS Take 100 mg by mouth at bedtime.    [provider]  dantrolene (DANTRIUM) 50 MG capsule Take 1 capsule (50 mg total) by mouth at bedtime. 05/11/21   Angiulli, Mcarthur Rossetti, PA-C  diclofenac Sodium (VOLTAREN) 1 % GEL Apply 2 g topically 4 (four) times daily. 05/11/21   Angiulli, Mcarthur Rossetti, PA-C  docusate sodium (COLACE) 100 MG capsule Take 1 capsule  (100 mg total) by mouth 2 (two) times daily. 04/13/21   Pokhrel, Rebekah Chesterfield, MD  glimepiride (AMARYL) 1 MG tablet Take 1 tablet (1 mg total) by mouth daily with breakfast. 05/11/21   Angiulli, Mcarthur Rossetti, PA-C  hydrochlorothiazide (HYDRODIURIL) 12.5 MG tablet Take 1 tablet (12.5 mg total) by mouth daily. 06/14/21   Milagros Loll, MD  lisinopril (ZESTRIL) 10 MG tablet Take 1 tablet (10 mg total) by  mouth daily. 05/11/21   Angiulli, Mcarthur Rossetti, PA-C  magnesium oxide (MAG-OX) 400 MG tablet Take 2 tablets (800 mg total) by mouth at bedtime. 05/11/21   Angiulli, Mcarthur Rossetti, PA-C  magnesium oxide (MAG-OX) 400 (240 Mg) MG tablet Take 1 tablet (400 mg total) by mouth daily. 05/13/21   Angiulli, Mcarthur Rossetti, PA-C  Menaquinone-7 (VITAMIN K2 PO) Take 1 tablet by mouth daily.    [provider]  Omega-3 Fatty Acids (FISH OIL) 600 MG CAPS Take 600 mg by mouth daily.    [provider]  pantoprazole (PROTONIX) 40 MG tablet Take 1 tablet (40 mg total) by mouth daily. 05/11/21   Angiulli, Mcarthur Rossetti, PA-C  polyethylene glycol (MIRALAX / GLYCOLAX) 17 g packet Take 17 g by mouth daily. 04/14/21   Pokhrel, Rebekah Chesterfield, MD  tamsulosin (FLOMAX) 0.4 MG CAPS capsule Take 1 capsule (0.4 mg total) by mouth daily after supper. 05/11/21   Angiulli, Mcarthur Rossetti, PA-C  tiZANidine (ZANAFLEX) 2 MG tablet Take 3 tablets (6 mg total) by mouth daily. 05/11/21   Angiulli, Mcarthur Rossetti, PA-C    Allergies    Baclofen and Nickel  Review of Systems   Review of Systems  Constitutional:  Negative for chills and fever.  HENT:  Negative for ear pain and sore throat.   Eyes:  Negative for pain and visual disturbance.  Respiratory:  Negative for cough and shortness of breath.   Cardiovascular:  Negative for chest pain and palpitations.  Gastrointestinal:  Negative for abdominal pain and vomiting.  Genitourinary:  Negative for dysuria and hematuria.  Musculoskeletal:  Negative for arthralgias and back pain.  Skin:  Negative for color change and  rash.  Neurological:  Negative for seizures and syncope.  All other systems reviewed and are negative.  Physical Exam Updated Vital Signs BP (!) 216/94 (BP Location: Right Arm)   Pulse 80   Temp 98.9 F (37.2 C) (Oral)   Resp 14   Ht 5\' 1"  (1.549 m)   Wt 76.2 kg   SpO2 100%   BMI 31.74 kg/m   Physical Exam Vitals and nursing note reviewed.  Constitutional:      General: She is not in acute distress.    Appearance: She is well-developed.  HENT:     Head: Normocephalic and atraumatic.  Eyes:     Conjunctiva/sclera: Conjunctivae normal.  Cardiovascular:     Rate and Rhythm: Normal rate and regular rhythm.     Heart sounds: No murmur heard. Pulmonary:     Effort: Pulmonary effort is normal. No respiratory distress.     Breath sounds: Normal breath sounds.  Abdominal:     Palpations: Abdomen is soft.     Tenderness: There is no abdominal tenderness.  Musculoskeletal:     Cervical back: Neck supple.  Skin:    General: Skin is warm and dry.  Neurological:     Mental Status: She is alert.     Comments: Residual right-sided deficits  Psychiatric:        Mood and Affect: Mood normal.        Behavior: Behavior normal.    ED Results / Procedures / Treatments   Labs (all labs ordered are listed, but only abnormal results are displayed) Labs Reviewed  BASIC METABOLIC PANEL - Abnormal; Notable for the following components:      Result Value   Glucose, Bld 146 (*)    All other components within normal limits  CBC WITH DIFFERENTIAL/PLATELET    EKG None  Radiology No results found.  Procedures Procedures   Medications Ordered in ED Medications  amLODipine (NORVASC) tablet 10 mg (10 mg Oral Given 06/14/21 2019)  hydrochlorothiazide (HYDRODIURIL) tablet 12.5 mg (12.5 mg Oral Given 06/14/21 2020)  hydrALAZINE (APRESOLINE) tablet 25 mg (25 mg Oral Given 06/14/21 2133)    ED Course  I have reviewed the triage vital signs and the nursing notes.  Pertinent labs &  imaging results that were available during my care of the patient were reviewed by me and considered in my medical decision making (see chart for details).    MDM Rules/Calculators/A&P                           65 year old lady presents to ER with request for medication refill.  Concern for being out of her blood pressure medication.  Had difficulty following up with PCP reportedly after recent hospitalization for stroke.  Patient adamant that she has no symptoms today and only wants medication refill.  She was noted to be profoundly hypertensive.  Recommended providing her home BP medication and monitoring in ER.  Check basic labs, EKG, no acute changes appreciated.  Her blood pressure mildly improved.  Patient remains adamant that she has no medical complaints and wishes to go home.  Given the lack of symptoms, will discharge patient home with refill on her BP meds, stressed need for close follow-up with a primary care doctor.  Placed consult to [facility] to assist with PCP follow-up.  Discharged home with family.  After the discussed management above, the patient was determined to be safe for discharge.  The patient was in agreement with this plan and all questions regarding their care were answered.  ED return precautions were discussed and the patient will return to the ED with any significant worsening of condition.  Final Clinical Impression(s) / ED Diagnoses Final diagnoses:  Asymptomatic hypertension  Medication refill    Rx / DC Orders ED Discharge Orders          Ordered    amLODipine (NORVASC) 10 MG tablet  Daily        06/14/21 2334    atorvastatin (LIPITOR) 80 MG tablet  Daily        06/14/21 2334    hydrochlorothiazide (HYDRODIURIL) 12.5 MG tablet  Daily        06/14/21 2334             Milagros Loll, MD 06/15/21 1541

## 2021-06-14 NOTE — ED Triage Notes (Signed)
Patient reports to the ER following a Physical therapy apt. Patient states she was told her BP was high. She tried to call her PCP and was told he had quit without notice and moved to [facility]. Patient reports needing refills of her HCTZ, Tramadol, tamulosin, plavix, and atorvastatin

## 2021-06-14 NOTE — Discharge Instructions (Addendum)
Please follow-up with a primary care doctor.  It is very important that you have follow-up with primary care doctor and have close monitoring of your blood pressure.  Please take your medications as previously prescribed.  If you develop any chest pain, difficulty in breathing, headaches, vomiting or other new concerning symptom, come back to ER for reassessment.

## 2021-06-17 ENCOUNTER — Emergency Department (HOSPITAL_COMMUNITY): Payer: Medicare Other

## 2021-06-17 ENCOUNTER — Inpatient Hospital Stay (HOSPITAL_COMMUNITY)
Admission: EM | Admit: 2021-06-17 | Discharge: 2021-06-30 | DRG: 064 | Disposition: A | Discharge status: Home / Self Care | Payer: Medicare Other | Attending: Internal Medicine | Admitting: Internal Medicine

## 2021-06-17 ENCOUNTER — Other Ambulatory Visit: Payer: Self-pay

## 2021-06-17 DIAGNOSIS — R29710 NIHSS score 10: Secondary | ICD-10-CM | POA: Diagnosis present

## 2021-06-17 DIAGNOSIS — I6930 Unspecified sequelae of cerebral infarction: Secondary | ICD-10-CM

## 2021-06-17 DIAGNOSIS — E7800 Pure hypercholesterolemia, unspecified: Secondary | ICD-10-CM | POA: Diagnosis not present

## 2021-06-17 DIAGNOSIS — I6340 Cerebral infarction due to embolism of unspecified cerebral artery: Secondary | ICD-10-CM | POA: Diagnosis present

## 2021-06-17 DIAGNOSIS — E1122 Type 2 diabetes mellitus with diabetic chronic kidney disease: Secondary | ICD-10-CM | POA: Diagnosis present

## 2021-06-17 DIAGNOSIS — R32 Unspecified urinary incontinence: Secondary | ICD-10-CM | POA: Diagnosis not present

## 2021-06-17 DIAGNOSIS — Z79899 Other long term (current) drug therapy: Secondary | ICD-10-CM

## 2021-06-17 DIAGNOSIS — I69351 Hemiplegia and hemiparesis following cerebral infarction affecting right dominant side: Secondary | ICD-10-CM

## 2021-06-17 DIAGNOSIS — Z28310 Unvaccinated for covid-19: Secondary | ICD-10-CM

## 2021-06-17 DIAGNOSIS — Z66 Do not resuscitate: Secondary | ICD-10-CM | POA: Diagnosis present

## 2021-06-17 DIAGNOSIS — R159 Full incontinence of feces: Secondary | ICD-10-CM | POA: Diagnosis present

## 2021-06-17 DIAGNOSIS — J449 Chronic obstructive pulmonary disease, unspecified: Secondary | ICD-10-CM | POA: Diagnosis present

## 2021-06-17 DIAGNOSIS — F32A Depression, unspecified: Secondary | ICD-10-CM | POA: Diagnosis present

## 2021-06-17 DIAGNOSIS — I69998 Other sequelae following unspecified cerebrovascular disease: Secondary | ICD-10-CM | POA: Diagnosis not present

## 2021-06-17 DIAGNOSIS — U071 COVID-19: Secondary | ICD-10-CM | POA: Diagnosis present

## 2021-06-17 DIAGNOSIS — Z7982 Long term (current) use of aspirin: Secondary | ICD-10-CM

## 2021-06-17 DIAGNOSIS — L988 Other specified disorders of the skin and subcutaneous tissue: Secondary | ICD-10-CM | POA: Diagnosis present

## 2021-06-17 DIAGNOSIS — R296 Repeated falls: Secondary | ICD-10-CM | POA: Diagnosis present

## 2021-06-17 DIAGNOSIS — E1140 Type 2 diabetes mellitus with diabetic neuropathy, unspecified: Secondary | ICD-10-CM | POA: Diagnosis present

## 2021-06-17 DIAGNOSIS — Z6825 Body mass index (BMI) 25.0-25.9, adult: Secondary | ICD-10-CM

## 2021-06-17 DIAGNOSIS — I130 Hypertensive heart and chronic kidney disease with heart failure and stage 1 through stage 4 chronic kidney disease, or unspecified chronic kidney disease: Secondary | ICD-10-CM | POA: Diagnosis present

## 2021-06-17 DIAGNOSIS — E782 Mixed hyperlipidemia: Secondary | ICD-10-CM | POA: Diagnosis present

## 2021-06-17 DIAGNOSIS — M1990 Unspecified osteoarthritis, unspecified site: Secondary | ICD-10-CM | POA: Diagnosis present

## 2021-06-17 DIAGNOSIS — R4701 Aphasia: Secondary | ICD-10-CM | POA: Diagnosis not present

## 2021-06-17 DIAGNOSIS — G8929 Other chronic pain: Secondary | ICD-10-CM | POA: Diagnosis present

## 2021-06-17 DIAGNOSIS — I672 Cerebral atherosclerosis: Secondary | ICD-10-CM | POA: Diagnosis present

## 2021-06-17 DIAGNOSIS — I10 Essential (primary) hypertension: Secondary | ICD-10-CM | POA: Diagnosis present

## 2021-06-17 DIAGNOSIS — Z888 Allergy status to other drugs, medicaments and biological substances status: Secondary | ICD-10-CM

## 2021-06-17 DIAGNOSIS — E876 Hypokalemia: Secondary | ICD-10-CM | POA: Diagnosis not present

## 2021-06-17 DIAGNOSIS — I5042 Chronic combined systolic (congestive) and diastolic (congestive) heart failure: Secondary | ICD-10-CM | POA: Diagnosis present

## 2021-06-17 DIAGNOSIS — G4733 Obstructive sleep apnea (adult) (pediatric): Secondary | ICD-10-CM | POA: Diagnosis present

## 2021-06-17 DIAGNOSIS — R531 Weakness: Secondary | ICD-10-CM

## 2021-06-17 DIAGNOSIS — M797 Fibromyalgia: Secondary | ICD-10-CM | POA: Diagnosis present

## 2021-06-17 DIAGNOSIS — E669 Obesity, unspecified: Secondary | ICD-10-CM | POA: Diagnosis present

## 2021-06-17 DIAGNOSIS — N179 Acute kidney failure, unspecified: Secondary | ICD-10-CM | POA: Diagnosis present

## 2021-06-17 DIAGNOSIS — I6330 Cerebral infarction due to thrombosis of unspecified cerebral artery: Secondary | ICD-10-CM | POA: Insufficient documentation

## 2021-06-17 DIAGNOSIS — Y636 Underdosing and nonadministration of necessary drug, medicament or biological substance: Secondary | ICD-10-CM | POA: Diagnosis present

## 2021-06-17 DIAGNOSIS — M109 Gout, unspecified: Secondary | ICD-10-CM | POA: Diagnosis present

## 2021-06-17 DIAGNOSIS — N1831 Chronic kidney disease, stage 3a: Secondary | ICD-10-CM | POA: Diagnosis present

## 2021-06-17 DIAGNOSIS — I519 Heart disease, unspecified: Secondary | ICD-10-CM | POA: Diagnosis not present

## 2021-06-17 DIAGNOSIS — Z8249 Family history of ischemic heart disease and other diseases of the circulatory system: Secondary | ICD-10-CM

## 2021-06-17 DIAGNOSIS — I6389 Other cerebral infarction: Secondary | ICD-10-CM | POA: Diagnosis not present

## 2021-06-17 DIAGNOSIS — I959 Hypotension, unspecified: Secondary | ICD-10-CM | POA: Diagnosis not present

## 2021-06-17 DIAGNOSIS — I639 Cerebral infarction, unspecified: Secondary | ICD-10-CM

## 2021-06-17 DIAGNOSIS — Z883 Allergy status to other anti-infective agents status: Secondary | ICD-10-CM

## 2021-06-17 DIAGNOSIS — I2510 Atherosclerotic heart disease of native coronary artery without angina pectoris: Secondary | ICD-10-CM | POA: Diagnosis present

## 2021-06-17 DIAGNOSIS — Z87891 Personal history of nicotine dependence: Secondary | ICD-10-CM

## 2021-06-17 DIAGNOSIS — Z91199 Patient's noncompliance with other medical treatment and regimen due to unspecified reason: Secondary | ICD-10-CM

## 2021-06-17 DIAGNOSIS — E1165 Type 2 diabetes mellitus with hyperglycemia: Secondary | ICD-10-CM

## 2021-06-17 DIAGNOSIS — I129 Hypertensive chronic kidney disease with stage 1 through stage 4 chronic kidney disease, or unspecified chronic kidney disease: Secondary | ICD-10-CM | POA: Diagnosis present

## 2021-06-17 DIAGNOSIS — Z7902 Long term (current) use of antithrombotics/antiplatelets: Secondary | ICD-10-CM

## 2021-06-17 DIAGNOSIS — I444 Left anterior fascicular block: Secondary | ICD-10-CM | POA: Diagnosis present

## 2021-06-17 DIAGNOSIS — E785 Hyperlipidemia, unspecified: Secondary | ICD-10-CM | POA: Diagnosis not present

## 2021-06-17 DIAGNOSIS — Z7984 Long term (current) use of oral hypoglycemic drugs: Secondary | ICD-10-CM

## 2021-06-17 DIAGNOSIS — Z9049 Acquired absence of other specified parts of digestive tract: Secondary | ICD-10-CM

## 2021-06-17 DIAGNOSIS — E1149 Type 2 diabetes mellitus with other diabetic neurological complication: Secondary | ICD-10-CM | POA: Diagnosis not present

## 2021-06-17 DIAGNOSIS — I429 Cardiomyopathy, unspecified: Secondary | ICD-10-CM | POA: Diagnosis not present

## 2021-06-17 LAB — RESP PANEL BY RT-PCR (FLU A&B, COVID) ARPGX2
Influenza A by PCR: NEGATIVE
Influenza B by PCR: NEGATIVE
SARS Coronavirus 2 by RT PCR: POSITIVE — AB

## 2021-06-17 LAB — BASIC METABOLIC PANEL
Anion gap: 14 (ref 5–15)
BUN: 18 mg/dL (ref 8–23)
CO2: 21 mmol/L — ABNORMAL LOW (ref 22–32)
Calcium: 9.3 mg/dL (ref 8.9–10.3)
Chloride: 101 mmol/L (ref 98–111)
Creatinine, Ser: 1.14 mg/dL — ABNORMAL HIGH (ref 0.44–1.00)
GFR, Estimated: 53 mL/min — ABNORMAL LOW (ref >60)
Glucose, Bld: 151 mg/dL — ABNORMAL HIGH (ref 70–99)
Potassium: 3.6 mmol/L (ref 3.5–5.1)
Sodium: 136 mmol/L (ref 135–145)

## 2021-06-17 LAB — CBC
HCT: 45.3 % (ref 36.0–46.0)
Hemoglobin: 15 g/dL (ref 12.0–15.0)
MCH: 32.2 pg (ref 26.0–34.0)
MCHC: 33.1 g/dL (ref 30.0–36.0)
MCV: 97.2 fL (ref 80.0–100.0)
Platelets: 304 10*3/uL (ref 150–400)
RBC: 4.66 MIL/uL (ref 3.87–5.11)
RDW: 13.2 % (ref 11.5–15.5)
WBC: 6.1 10*3/uL (ref 4.0–10.5)
nRBC: 0 % (ref 0.0–0.2)

## 2021-06-17 LAB — CBG MONITORING, ED: Glucose-Capillary: 136 mg/dL — ABNORMAL HIGH (ref 70–99)

## 2021-06-17 MED ORDER — ASPIRIN EC 81 MG PO TBEC
81.0000 mg | DELAYED_RELEASE_TABLET | Freq: Every day | ORAL | Status: DC
  Administered 2021-06-18 – 2021-06-19 (×2): 81 mg via ORAL
  Filled 2021-06-17 (×2): qty 1

## 2021-06-17 MED ORDER — SENNOSIDES-DOCUSATE SODIUM 8.6-50 MG PO TABS: 1.0000 | ORAL_TABLET | Freq: Every evening | ORAL | Status: DC | PRN

## 2021-06-17 MED ORDER — TAMSULOSIN HCL 0.4 MG PO CAPS
0.4000 mg | ORAL_CAPSULE | Freq: Every day | ORAL | Status: DC
  Administered 2021-06-18 – 2021-06-30 (×13): 0.4 mg via ORAL
  Filled 2021-06-17 (×13): qty 1

## 2021-06-17 MED ORDER — ATORVASTATIN CALCIUM 80 MG PO TABS
80.0000 mg | ORAL_TABLET | Freq: Every day | ORAL | Status: DC
  Administered 2021-06-18 – 2021-06-30 (×13): 80 mg via ORAL
  Filled 2021-06-17 (×13): qty 1

## 2021-06-17 MED ORDER — ONDANSETRON HCL 4 MG/2ML IJ SOLN
4.0000 mg | Freq: Four times a day (QID) | INTRAMUSCULAR | Status: DC | PRN
  Administered 2021-06-21: 4 mg via INTRAVENOUS
  Filled 2021-06-17: qty 2

## 2021-06-17 MED ORDER — HYDROCHLOROTHIAZIDE 25 MG PO TABS
12.5000 mg | ORAL_TABLET | Freq: Every day | ORAL | Status: DC
  Administered 2021-06-17 – 2021-06-19 (×3): 12.5 mg via ORAL
  Filled 2021-06-17 (×3): qty 1

## 2021-06-17 MED ORDER — CLOPIDOGREL BISULFATE 75 MG PO TABS
75.0000 mg | ORAL_TABLET | Freq: Every day | ORAL | Status: DC
  Administered 2021-06-17: 75 mg via ORAL
  Filled 2021-06-17: qty 1

## 2021-06-17 MED ORDER — ACETAMINOPHEN 325 MG PO TABS
650.0000 mg | ORAL_TABLET | Freq: Four times a day (QID) | ORAL | Status: DC | PRN
  Administered 2021-06-18: 650 mg via ORAL
  Filled 2021-06-17: qty 2

## 2021-06-17 MED ORDER — ONDANSETRON HCL 4 MG PO TABS: 4.0000 mg | ORAL_TABLET | Freq: Four times a day (QID) | ORAL | Status: DC | PRN

## 2021-06-17 MED ORDER — HEPARIN SODIUM (PORCINE) 5000 UNIT/ML IJ SOLN
5000.0000 [IU] | Freq: Three times a day (TID) | INTRAMUSCULAR | Status: DC
  Administered 2021-06-17: 5000 [IU] via SUBCUTANEOUS
  Filled 2021-06-17: qty 1

## 2021-06-17 MED ORDER — PANTOPRAZOLE SODIUM 40 MG PO TBEC: 40.0000 mg | DELAYED_RELEASE_TABLET | Freq: Every day | ORAL | Status: DC

## 2021-06-17 MED ORDER — DANTROLENE SODIUM 25 MG PO CAPS
50.0000 mg | ORAL_CAPSULE | Freq: Every day | ORAL | Status: DC
  Administered 2021-06-17 – 2021-06-30 (×14): 50 mg via ORAL
  Filled 2021-06-17 (×14): qty 2

## 2021-06-17 MED ORDER — LISINOPRIL 10 MG PO TABS: 10.0000 mg | ORAL_TABLET | Freq: Every day | ORAL | Status: DC

## 2021-06-17 MED ORDER — ACETAMINOPHEN 650 MG RE SUPP: 650.0000 mg | Freq: Four times a day (QID) | RECTAL | Status: DC | PRN

## 2021-06-17 MED ORDER — GLIMEPIRIDE 1 MG PO TABS: 1.0000 mg | ORAL_TABLET | Freq: Every day | ORAL | Status: DC

## 2021-06-17 NOTE — Consult Note (Signed)
Neurology Consult H&P  KENYADA DOSCH MR# [id number] 06/17/2021   CC: left weakness  History is obtained from: patient and chart.  HPI: Jade [name] is a 65 y.o. female PMHx as reviewed below recent stroke s/p left carotid stent placement residual right upper extremity weakness and severe pain with progressive weakness found to have subacute strokes on imaging not present in prior imaging.  The following information was taken from ED note 06/17/2021:   "...history of CVA with right-sided paralysis, COPD, fibromyalgia, diabetes.  She normally is nonambulatory due to her paralysis from the prior stroke.  She said that over the last 2 to 3 days she has had some generalized weakness.  She is not sure if it is from a new stroke or she is just weak.  She is weak both on the right side and the left side.  She cannot really tell if is a change from her prior stroke.  She denies any fevers.  No cough or cold symptoms.  No vomiting or diarrhea.  No chest pain or shortness of breath.  No other recent illnesses."  LKW: unclear tNK given: No OSW IR Thrombectomy No Modified Rankin Scale: 4-Needs assistance to walk and tend to bodily needs NIHSS: 10 LOC Responsiveness 0 LOC Questions 0 LOC Commands 0 Horizontal eye movement 0 Visual field 0 Facial palsy 0 Motor arm - Right arm 3 Motor arm - Left arm 2 Motor leg - Right leg 2 Motor leg - Left leg 2 Limb ataxia 0 Sensory test 1 Language 0 Speech 0 Extinction and inattention 0   ROS: A complete ROS was performed and is negative except as noted in the HPI.   Past Medical History:  Diagnosis Date   Arthritis    "back, knees, some in my left hip" (03/21/2018)   Chronic kidney disease    "was told I had 25% kidney function in early 2012" (03/21/2018)   COPD (chronic obstructive pulmonary disease) (HCC)    CVA (cerebral vascular accident) (HCC) 03/21/2018   "numb all over; head to toe" (03/21/2018)   Fibromyalgia    History of gout     Hyperlipidemia    Hypertension    Migraine    "used to get them monthly during menopause" (03/21/2018)   Neuromuscular disorder (HCC)    OSA (obstructive sleep apnea)    "don't tolerate the machine" (03/21/2018)   Pneumonia ~ 2007   Type 2 diabetes, diet controlled (HCC)      Family History  Problem Relation Age of Onset   Heart disease Father    Leukemia Father    Alcohol abuse Son    Other Mother    Cancer Neg Hx    Diabetes Neg Hx    Hearing loss Neg Hx    Hyperlipidemia Neg Hx    Hypertension Neg Hx    Kidney disease Neg Hx    Stroke Neg Hx     Social History:  reports that she quit smoking about 33 years ago. Her smoking use included cigarettes. She has a 20.00 pack-year smoking history. She has never used smokeless tobacco. She reports that she does not currently use alcohol. She reports that she does not currently use drugs after having used the following drugs: Marijuana.   Prior to Admission medications   Medication Sig Start Date End Date Taking? Authorizing Provider  acetaminophen (TYLENOL) 325 MG tablet Take 2 tablets (650 mg total) by mouth every 6 (six) hours as needed for mild pain or headache (  or temp > 37.5 C (99.5 F)). 04/13/21   Pokhrel, Rebekah Chesterfield, MD  amLODipine (NORVASC) 10 MG tablet Take 1 tablet (10 mg total) by mouth daily. 06/14/21 07/14/21  Milagros Loll, MD  aspirin EC 325 MG EC tablet Take 1 tablet (325 mg total) by mouth daily. 04/14/21 07/13/21  Pokhrel, Rebekah Chesterfield, MD  atorvastatin (LIPITOR) 80 MG tablet Take 1 tablet (80 mg total) by mouth daily. 06/14/21 07/14/21  Milagros Loll, MD  B Complex Vitamins (VITAMIN B COMPLEX) TABS Take 1 tabet by mouth once daily. 05/11/21   Angiulli, Mcarthur Rossetti, PA-C  Cholecalciferol 125 MCG (5000 UT) TABS Take 1 tablet (5,000 Units total) by mouth daily. 05/11/21   Angiulli, Mcarthur Rossetti, PA-C  Coenzyme Q10 (COQ-10) 100 MG CAPS Take 100 mg by mouth at bedtime.    [provider]  dantrolene (DANTRIUM) 50 MG  capsule Take 1 capsule (50 mg total) by mouth at bedtime. 05/11/21   Angiulli, Mcarthur Rossetti, PA-C  diclofenac Sodium (VOLTAREN) 1 % GEL Apply 2 g topically 4 (four) times daily. 05/11/21   Angiulli, Mcarthur Rossetti, PA-C  docusate sodium (COLACE) 100 MG capsule Take 1 capsule (100 mg total) by mouth 2 (two) times daily. 04/13/21   Pokhrel, Rebekah Chesterfield, MD  glimepiride (AMARYL) 1 MG tablet Take 1 tablet (1 mg total) by mouth daily with breakfast. 05/11/21   Angiulli, Mcarthur Rossetti, PA-C  hydrochlorothiazide (HYDRODIURIL) 12.5 MG tablet Take 1 tablet (12.5 mg total) by mouth daily. 06/14/21   Milagros Loll, MD  lisinopril (ZESTRIL) 10 MG tablet Take 1 tablet (10 mg total) by mouth daily. 05/11/21   Angiulli, Mcarthur Rossetti, PA-C  magnesium oxide (MAG-OX) 400 MG tablet Take 2 tablets (800 mg total) by mouth at bedtime. 05/11/21   Angiulli, Mcarthur Rossetti, PA-C  magnesium oxide (MAG-OX) 400 (240 Mg) MG tablet Take 1 tablet (400 mg total) by mouth daily. 05/13/21   Angiulli, Mcarthur Rossetti, PA-C  Menaquinone-7 (VITAMIN K2 PO) Take 1 tablet by mouth daily.    [provider]  Omega-3 Fatty Acids (FISH OIL) 600 MG CAPS Take 600 mg by mouth daily.    [provider]  pantoprazole (PROTONIX) 40 MG tablet Take 1 tablet (40 mg total) by mouth daily. 05/11/21   Angiulli, Mcarthur Rossetti, PA-C  polyethylene glycol (MIRALAX / GLYCOLAX) 17 g packet Take 17 g by mouth daily. 04/14/21   Pokhrel, Rebekah Chesterfield, MD  tamsulosin (FLOMAX) 0.4 MG CAPS capsule Take 1 capsule (0.4 mg total) by mouth daily after supper. 05/11/21   Angiulli, Mcarthur Rossetti, PA-C  tiZANidine (ZANAFLEX) 2 MG tablet Take 3 tablets (6 mg total) by mouth daily. 05/11/21   Angiulli, Mcarthur Rossetti, PA-C    Exam: Current vital signs: BP (!) 162/72   Pulse 69   Temp 98.9 F (37.2 C) (Oral)   Resp 17   Ht 5\' 6"  (1.676 m)   Wt 77.1 kg   SpO2 95%   BMI 27.44 kg/m   Physical Exam  Constitutional: Appears well-developed and well-nourished.  Psych: Affect appropriate to situation Eyes: No  scleral injection HENT: No OP obstruction. Head: Normocephalic.  Cardiovascular: Normal rate and regular rhythm.  Respiratory: Effort normal, symmetric excursions bilaterally, no audible wheezing. GI: Soft.  No distension. There is no tenderness.  Skin: WDI  Neuro: Mental Status: Patient is awake, alert, oriented to person, place, month, year, and situation. Patient is able to give a clear and coherent history. Speech fluent, intact comprehension and repetition. No signs of aphasia or  neglect. Visual Fields are full. Pupils are equal, round, and reactive to light. EOMI without ptosis or diploplia.  Facial sensation is symmetric to temperature Facial movement is symmetric.  Hearing is intact to voice. Uvula midline and palate elevates symmetrically. Shoulder shrug is symmetric. Tongue is midline without atrophy or fasciculations.  Tone is normal. Bulk is normal. Diffusely weak with right UE ~3/5 and limited by extreme pain. ~4-/5 strength was present in remaining extremities.  She states she has been recumbent for the past couple of days which is caused for the weakness. Sensation is symmetric to light touch and temperature in the arms and legs. Deep Tendon Reflexes: 2+ and symmetric in the biceps and patellae.  Unable to test right upper extremity biceps due to extreme pain. Babinski R FNF and HKS are intact bilaterally. Gait - Deferred  I have reviewed labs in epic and the pertinent results are:  Ref. Range 06/17/2021 13:36  Glucose-Capillary Latest Ref Range: 70 - 99 mg/dL 250 (H)    I have reviewed the images obtained: MRI brain showed no acute MRI finding. Late subacute infarction of the left parietal cortical and subcortical brain as seen previously. No evidence of true recent extension. 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 These were not present in August of this year but are not acute insults.  Extensive chronic small-vessel ischemic changes of the pons, thalami, basal ganglia and hemispheric white matter. Chronic blood products in the deep white matter adjacent to the anterior body of the left lateral ventricle, unchanged.  Assessment: LESETTE FRARY is a 65 y.o. female PMHx as noted below, previous stroke s/p left carotid stenting and residual right-sided deficits and severe pain syndrome with progressive diffuse weakness and significant right upper extremity weakness and pain found to have subacute strokes not seen on previous imaging.  She will need stroke evaluation to control secondary risk factors.   Plan: -She may need further titration of statin. -Continue aspirin 81mg  daily. -Continue clopidogrel 75mg  daily for 3 weeks. - SBP goal <130/90. - Telemetry monitoring for arrhythmia. - Recommend bedside Swallow screen. - Recommend Stroke education. - Recommend PT/OT/SLP consult.   Electronically signed by:  , MD Page: 06/17/2021, 9:21 PM

## 2021-06-17 NOTE — ED Provider Notes (Signed)
Emergency Medicine Provider Triage Evaluation Note  Jade [name] , a 65 y.o. female  was evaluated in triage.  Pt complains of gradually worsening weakness for the past week.  Patient has a history of 7 strokes/TIAs since July.  She has baseline right-sided upper and lower extremity deficits.  Denies any chest pain or shortness breath. Denies any worsening right-sided weakness.  Denies any blurry vision.  Review of Systems  Positive: Weakness Negative: Unilateral weakness, fatigue, chest pain, shortness of breath, blurry vision  Physical Exam  BP (!) 183/93 (BP Location: Left Arm)   Pulse 96   Temp 98.9 F (37.2 C) (Oral)   Resp 20   SpO2 97%  Gen:   Awake, no distress   Resp:  Normal effort  MSK:   Moves extremities without difficulty.  Unable to move right upper and lower extremity at baseline. Other:  Right-sided upper and lower extremity weakness.  No left-sided weakness.  Cranial nerves II through XII grossly intact.  Patient is nonambulatory at baseline  Medical Decision Making  Medically screening exam initiated at 1:01 PM.  Appropriate orders placed.  Bana Borgmeyer Luddy was informed that the remainder of the evaluation will be completed by another provider, this initial triage assessment does not replace that evaluation, and the importance of remaining in the ED until their evaluation is complete.  Weakness ED work-up.   Achille Rich, PA-C 06/17/21 1304    Jacalyn Lefevre, MD 06/17/21 1538

## 2021-06-17 NOTE — ED Triage Notes (Signed)
Pt here via [facility]. Pt has increased weakness X1 week` Pt history of stroke with right side extremity baseline deficits.  160P HR 99 96% RA  RR 17

## 2021-06-17 NOTE — H&P (Addendum)
Date: 06/17/2021               Patient Name:  Jade [name] MRN: [id number]  DOB: 1956/01/27 Age / Sex: 65 y.o., female   PCP: Nathaneil Canary, PA-C         Medical Service: Internal Medicine Teaching Service         Attending Physician: Dr. Earl Lagos, MD    First Contact: Gwenevere Abbot MD Pager: Meryl Dare [phone]  Second Contact: Thalia Bloodgood, DO Pager: Theresa Duty [phone]       After Hours (After 5p/  First Contact Pager: [phone]  weekends / holidays): Second Contact Pager: [phone]   SUBJECTIVE   Chief Complaint: Weakness  History of Present Illness: Jade [name] is a 65 year old female living with HTN, HLD, T2DM, CKD stage IIIa, left carotid artery stenosis s/p left transcarotid artery revascularization CVA in August who presents with generalized weakness. Patient reports initial right arm and right leg weakness after stroke.  She believes she regained 50% of her function after CIR. She was at home in wheelchair and her son was taking care of her. He has had an acute injury of his back and not been able to help as much recently.  She says she has progressively gotten weaker since being home but denies any sudden change.  She notices the weakness mostly in her right side, but also notes she is weak overall. This last week she worsened to the point she decided to come back to ED. Report she is compliant on Asprin and Plavix. Missed follow up with Dr.Sethi and was unaware of appointment. She is requesting admission to CIR. She has not had a COVID vaccine and would not like one. We discussed this may prevent further placement.   Review of Systems  Constitutional:  Negative for chills and fever.  HENT:  Negative for congestion and hearing loss.   Eyes:  Positive for blurred vision (chronic) and double vision (chronic).  Respiratory:  Negative for cough and sputum production.   Cardiovascular:  Negative for chest pain and orthopnea.  Gastrointestinal:  Negative for abdominal pain.  Nausea: incontinecne. Genitourinary:  Dysuria: incontinence.  Musculoskeletal:  Negative for falls. Myalgias: muscle cramps( right hand). Neurological:  Positive for weakness. Negative for sensory change and speech change.  Endo/Heme/Allergies:  Negative for environmental allergies and polydipsia.  Psychiatric/Behavioral:  Negative for substance abuse. The patient has insomnia.      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 These were not present in August of this year but are not acute insults. IMTS consulted for admission. Neurology consulted in ED  Meds:  No outpatient medications have been marked as taking for the 06/17/21 encounter [facility] Encounter).    Past Medical History:  Diagnosis Date   Arthritis    "back, knees, some in my left hip" (03/21/2018)   Chronic kidney disease    "was told I had 25% kidney function in early 2012" (03/21/2018)   COPD (chronic obstructive pulmonary disease) (HCC)    CVA (cerebral vascular accident) (HCC) 03/21/2018   "numb all over; head to toe" (03/21/2018)   Fibromyalgia    History of gout    Hyperlipidemia    Hypertension    Migraine    "used to get them monthly during menopause" (03/21/2018)   Neuromuscular disorder (HCC)    OSA (obstructive sleep apnea)    "don't tolerate the machine" (03/21/2018)   Pneumonia ~  2007   Type 2 diabetes, diet controlled [facility])     Past Surgical History:  Procedure Laterality Date   HERNIA REPAIR     LAPAROSCOPIC CHOLECYSTECTOMY  06/2007   "no UHR w/this" (03/21/2018)   TONSILLECTOMY     TRANSCAROTID ARTERY REVASCULARIZATION  Left 04/06/2021   Procedure: LEFT TRANSCAROTID ARTERY REVASCULARIZATION;  Surgeon: Leonie Douglas, MD;  Location: [facility] OR;  Service: Vascular;  Laterality: Left;   ULTRASOUND GUIDANCE FOR VASCULAR ACCESS Right 04/06/2021   Procedure: ULTRASOUND GUIDANCE FOR VASCULAR ACCESS;  Surgeon: Leonie Douglas, MD;  Location: [facility] OR;  Service: Vascular;  Laterality: Right;   UMBILICAL HERNIA REPAIR  2009    Social:  Lives with son at her home. She needs help with ADL's since stroke.  No alcohol use Tobacco use - 17 years 2 PPD = 34 pack years, quit 33 years ago No drug use  Family History:  Family History  Problem Relation Age of Onset   Heart disease Father    Leukemia Father    Alcohol abuse Son    Other Mother    Cancer Neg Hx    Diabetes Neg Hx    Hearing loss Neg Hx    Hyperlipidemia Neg Hx    Hypertension Neg Hx    Kidney disease Neg Hx    Stroke Neg Hx      Allergies: Allergies as of 06/17/2021 - Review Complete 06/17/2021  Allergen Reaction Noted   Baclofen Other (See Comments) 04/23/2021   Nickel Dermatitis and Rash 03/21/2018    Review of Systems: A complete ROS was negative except as per HPI.   OBJECTIVE:   Physical Exam: Blood pressure (!) 162/72, pulse 69, temperature 98.9 F (37.2 C), temperature source Oral, resp. rate 17, height 5\' 6"  (1.676 m), weight 77.1 kg, SpO2 95 %.  General: NAD, nl appearance HE:  EOMI, Conjunctivae normal ENT: No congestion, no rhinorrhea, no exudate or erythema  Cardiovascular: Normal rate, regular rhythm.  No murmurs, rubs, or gallops Pulmonary : Effort normal, breath sounds normal. No wheezes, rales, or rhonchi Abdominal: soft, nontender,  bowel sounds present Musculoskeletal: spasms in right hand, normal bulk and tone Skin: Melanotic papule with stuck on appearance on scalp, maculopapular erythematous rash on forehead Psychiatric/Behavioral:  normal mood, normal behavior   Mental Status: Patient is awake, alert, oriented x3 No signs of aphasia or neglect Cranial Nerves: II: Pupils equal, round, and reactive to light.   III,IV, VI: EOMI without ptosis ,  diplopia in left eye ( reports chronic ) V: Facial sensation is symmetric to light touch  VII: Facial movement is symmetric.  VIII: hearing is intact to voice X:  Uvula elevates symmetrically XI: Shoulder shrug is symmetric. XII: tongue is midline without atrophy or fasciculations.  Motor: 2/5 RLE, 2/5 RUE, 4/5 bilateral left upper and lower extremities Sensory: Sensation is grossly intact bilateral upper and lower ext Deep Tendon Reflexes: 3+ RUE and RLE    Labs: CBC    Component Value Date/Time   WBC 6.1 06/17/2021 1305   RBC 4.66 06/17/2021 1305   HGB 15.0 06/17/2021 1305   HCT 45.3 06/17/2021 1305   PLT 304 06/17/2021 1305   MCV 97.2 06/17/2021 1305   MCH 32.2 06/17/2021 1305   MCHC 33.1 06/17/2021 1305   RDW 13.2 06/17/2021 1305   LYMPHSABS 2.2 06/14/2021 2144   MONOABS 0.7 06/14/2021 2144   EOSABS 0.2 06/14/2021 2144   BASOSABS 0.1 06/14/2021 2144     CMP  Component Value Date/Time   NA 136 06/17/2021 1305   K 3.6 06/17/2021 1305   CL 101 06/17/2021 1305   CO2 21 (L) 06/17/2021 1305   GLUCOSE 151 (H) 06/17/2021 1305   BUN 18 06/17/2021 1305   CREATININE 1.14 (H) 06/17/2021 1305   CALCIUM 9.3 06/17/2021 1305   PROT 6.5 05/10/2021 0912   ALBUMIN 3.6 05/10/2021 0912   AST 21 05/10/2021 0912   ALT 19 05/10/2021 0912   ALKPHOS 80 05/10/2021 0912   BILITOT 0.7 05/10/2021 0912   GFRNONAA 53 (L) 06/17/2021 1305   GFRAA >60 03/23/2018 0622    Imaging: CT Head Wo Contrast  Result Date: 06/17/2021 CLINICAL DATA:  Neuro deficit, acute stroke suspected. Weakness for 1 week. History of stroke with right-sided baseline deficits. EXAM: CT HEAD WITHOUT CONTRAST TECHNIQUE: Contiguous axial images were obtained from the base of the skull through the vertex without intravenous contrast. COMPARISON:  CT head and CT angiogram 04/06/2021. FINDINGS: Brain: There is no evidence of acute intracranial hemorrhage, mass lesion, brain edema or new extra-axial fluid collection. 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 Stable atrophy with prominence of the ventricles and subarachnoid spaces. There are  chronic small vessel ischemic changes in the periventricular white matter. The previously demonstrated infarct in the posterior left frontal lobe demonstrates expected evolution with associated encephalomalacia. No stroke extension identified. Scattered low-density within the basal ganglia and thalami bilaterally appears unchanged. Vascular: Intracranial vascular calcifications. No hyperdense vessel identified. Skull: Negative for fracture or focal lesion. Sinuses/Orbits: Chronic dependent opacity in the left division of the sphenoid sinus, unchanged. The visualized paranasal sinuses, mastoid air cells and middle ears are otherwise clear. Other: Focal soft tissue protuberance in the midline frontal scalp appears unchanged. IMPRESSION: 1. No acute intracranial findings. 2. Expected evolution of previously demonstrated infarct in the posterior left frontal lobe. Chronic atrophy and small vessel ischemic changes. Electronically Signed   By: [name] Bullocks M.D.   On: 06/17/2021 15:00   MR BRAIN WO CONTRAST  Result Date: 06/17/2021 CLINICAL DATA:  Neuro deficit, acute, stroke suspected.  Weakness. EXAM: MRI HEAD WITHOUT CONTRAST TECHNIQUE: Multiplanar, multiecho pulse sequences of the brain and surrounding structures were obtained without intravenous contrast. COMPARISON:  CT earlier same day.  MRI 04/06/2021. FINDINGS: Brain: Extensive chronic small-vessel ischemic changes affect the pons. No focal cerebellar insult. Old lacunar infarctions are present within the thalami and basal ganglia. Extensive chronic small-vessel ischemic changes affect the cerebral hemispheric white matter. Previously seen acute infarction in the left parietal cortical and subcortical brain has undergone expected evolutionary changes. No evidence of true extension. There is a late subacute infarction within the right splenium of the corpus callosum that was not present in August of this year but is not acute. There is a subacute infarction  of the left splenium of the corpus callosum that was not present in August of this year but is not acute. No evidence of hydrocephalus or acute hemorrhage. There is chronic hemosiderin deposition related to an old white matter infarction adjacent to the anterior body of the left lateral ventricle. 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 Vascular: Major vessels at the base of the brain show flow. Skull and upper cervical spine: Negative Sinuses/Orbits: Clear/normal Other: None IMPRESSION: No acute MRI finding. Late subacute infarction of the left parietal cortical and subcortical brain as seen previously. No evidence of true recent extension. Subacute infarctions of the splenium  of the corpus callosum, late subacute on the right and subacute on the left. These were not present in August of this year but are not acute insults. Extensive chronic small-vessel ischemic changes of the pons, thalami, basal ganglia and hemispheric white matter. Chronic blood products in the deep white matter adjacent to the anterior body of the left lateral ventricle, unchanged. Electronically Signed   By: Paulina Fusi M.D.   On: 06/17/2021 18:43    EKG: personally reviewed my interpretation is sinus rhythm .   ASSESSMENT & PLAN:    Assessment & Plan by Problem: Active Problems:   Weakness due to old stroke   TORIANNE LAFLAM is a 65 y.o. with pertinent PMH HTN, HLD, T2DM, former smoker ,multiple CVA with recent CVA in August , and left carotid artery stenosis s/p left transcarotid artery revascularization (04/06/2021) ,who presented with weakness and admitted for subacute stroke.   #Subacute CVA # Weakness -Compliant on Plavix and Asprin 81 mg at home since discharge, confirmed with son. New changes since imaging in  August: subacute infarctions of the splenium of corpus callosum. Neurology consulted in ED.  -Patient slow progressive weakness since discharge from CIR. Moderate  assist for lifting and ambulating at time of discharge from CIR. Discharged to home. Expect patient needs higher level of care given deconditioned quickly at home.This is likely to be complicated by patient refusing vaccines.  - MS reported on problem list. Review of notes show patient was told 15 years ago she had MS, documented as confirmed on MRI and CSF. Reviewed note of patients current neurologist at [facility], and Neurology did not agree with diagnosis based on history and exam. - Modifiable Risk factors: HTN, HLD, T2DM - ASA 81 mg daily - continue Atorvastatin 80 mg - BP goal: <130/90 - HBAIC and Lipid profile - Telemetry monitoring - Frequent neuro checks  #Covid-19  - Asymptomatic , positive on screening test.Has not received Covid Vaccine. Continue to monitor, if patient develops symptoms can consider nirmatrelvir-ritonavir (Paxlovid) - Contact and airborne precautions  #Hypertension -Home medications amlodipine 10 mg, lisinopril 10 mg, HCTZ 12.5 - Son reports patient stopped taking amlodipine because someone she knows died taking this medication. Patient has not been taken lisinopril , and blood pressure elevated at recent ED visit. - BP goal as above, <130/90  - lisinopril 10 mg, HCTZ 12.5 mg  #Hyperlipidemia - Son reports patient is compliant on Statin, noted in charge to be non compliant previously.  - Lipid Panel as mentioned above - Continue Atorvastatin 80 mg  #CKD Stage 3a -Baseline creatinine 1.1-1.3. Cr 1.14 on admission.  - Continue to monitor - Restart Lisinopril as mentioned above  #Type 2 Diabetes Mellitus  - Hemoglobin A1c 6.5 03/2021, home medication glimepiride.  - SSI-S - Check Hemoglobin A1c as mentioned above   Diet: Carb-Modified VTE: Enoxaparin IVF: None,None Code: DNR  Prior to Admission Living Arrangement: Home, living with assistance from son Anticipated Discharge Location: CIR or SNF Barriers to Discharge: Workup as above,  placement  Dispo: Admit patient to Inpatient with expected length of stay greater than 2 midnights.  Signed: Cleotilde Neer, MD Internal Medicine Resident PGY-3 Pager: [phone]  06/17/2021, 9:46 PM

## 2021-06-17 NOTE — ED Notes (Signed)
Internal Medicine Resident aware that the pt is covid positive

## 2021-06-17 NOTE — ED Notes (Signed)
COVID precautions sign hung outside of the pts room

## 2021-06-17 NOTE — ED Provider Notes (Signed)
[facility] EMERGENCY DEPARTMENT Provider Note   CSN: [id number] Arrival date & time: 06/17/21  1251     History Chief Complaint  Patient presents with   Weakness    Jade [name] is a 65 y.o. female.  Patient is a 65 year old female who presents with generalized weakness.  She has a history of CVA with right-sided paralysis, COPD, fibromyalgia, diabetes.  She normally is nonambulatory due to her paralysis from the prior stroke.  She said that over the last 2 to 3 days she has had some generalized weakness.  She is not sure if it is from a new stroke or she is just weak.  She is weak both on the right side and the left side.  She cannot really tell if is a change from her prior stroke.  She denies any fevers.  No cough or cold symptoms.  No vomiting or diarrhea.  No chest pain or shortness of breath.  No other recent illnesses.      Past Medical History:  Diagnosis Date   Arthritis    "back, knees, some in my left hip" (03/21/2018)   Chronic kidney disease    "was told I had 25% kidney function in early 2012" (03/21/2018)   COPD (chronic obstructive pulmonary disease) (HCC)    CVA (cerebral vascular accident) (HCC) 03/21/2018   "numb all over; head to toe" (03/21/2018)   Fibromyalgia    History of gout    Hyperlipidemia    Hypertension    Migraine    "used to get them monthly during menopause" (03/21/2018)   Neuromuscular disorder (HCC)    OSA (obstructive sleep apnea)    "don't tolerate the machine" (03/21/2018)   Pneumonia ~ 2007   Type 2 diabetes, diet controlled [facility])     Patient Active Problem List   Diagnosis Date Noted   Labile blood glucose    Essential hypertension    Spastic hemiparesis (HCC)    Stage 3a chronic kidney disease (HCC)    Controlled type 2 diabetes mellitus with hyperglycemia, without long-term current use of insulin (HCC)    CVA (cerebral vascular accident) (HCC) 04/13/2021   TIA (transient ischemic attack) 04/02/2021    Carotid artery stenosis with cerebral infarction (HCC) 04/02/2021   COPD (chronic obstructive pulmonary disease) (HCC) 04/02/2021   Stroke-like symptoms 03/05/2021   Acute CVA (cerebrovascular accident) (HCC) 03/21/2018   Obesity, Class III, BMI 40-49.9 (morbid obesity) (HCC) 03/21/2018   IBS (irritable bowel syndrome) 04/12/2015   Hair loss 10/20/2014   Primary osteoarthritis of left knee 09/29/2014   Visit for screening mammogram 08/30/2014   Routine general medical examination at a health care facility 08/30/2014   Tinea corporis 12/21/2013   Essential hypertension, benign 12/21/2013   Low back pain 08/24/2013   Patient noncompliant with statin medication 02/23/2013   H/O abnormal Pap smear 10/24/2012   Osteopenia 09/26/2012   Diabetic neuropathy, painful (HCC) 05/26/2012   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   Pure hypercholesterolemia 03/20/2012   Chronic venous insufficiency 03/20/2012    Past Surgical History:  Procedure Laterality Date   HERNIA REPAIR     LAPAROSCOPIC CHOLECYSTECTOMY  06/2007   "no UHR w/this" (03/21/2018)   TONSILLECTOMY     TRANSCAROTID ARTERY REVASCULARIZATION  Left 04/06/2021   Procedure: LEFT TRANSCAROTID ARTERY REVASCULARIZATION;  Surgeon: Leonie Douglas, MD;  Location: [facility] OR;  Service: Vascular;  Laterality: Left;   ULTRASOUND GUIDANCE FOR VASCULAR ACCESS Right 04/06/2021   Procedure:  ULTRASOUND GUIDANCE FOR VASCULAR ACCESS;  Surgeon: Leonie Douglas, MD;  Location: [facility] OR;  Service: Vascular;  Laterality: Right;   UMBILICAL HERNIA REPAIR  2009     OB History     Gravida  3   Para  1   Term  1   Preterm      AB  2   Living  1      SAB  2   IAB      Ectopic      Multiple      Live Births  1           Family History  Problem Relation Age of Onset   Heart disease Father    Leukemia Father    Alcohol abuse Son    Other Mother    Cancer Neg Hx    Diabetes Neg Hx    Hearing loss  Neg Hx    Hyperlipidemia Neg Hx    Hypertension Neg Hx    Kidney disease Neg Hx    Stroke Neg Hx     Social History   Tobacco Use   Smoking status: Former    Packs/day: 1.25    Years: 16.00    Pack years: 20.00    Types: Cigarettes    Quit date: 08/28/1987    Years since quitting: 33.8   Smokeless tobacco: Never  Vaping Use   Vaping Use: Never used  Substance Use Topics   Alcohol use: Not Currently   Drug use: Not Currently    Types: Marijuana    Home Medications Prior to Admission medications   Medication Sig Start Date End Date Taking? Authorizing Provider  acetaminophen (TYLENOL) 325 MG tablet Take 2 tablets (650 mg total) by mouth every 6 (six) hours as needed for mild pain or headache (or temp > 37.5 C (99.5 F)). 04/13/21   Pokhrel, Rebekah Chesterfield, MD  amLODipine (NORVASC) 10 MG tablet Take 1 tablet (10 mg total) by mouth daily. 06/14/21 07/14/21  Milagros Loll, MD  aspirin EC 325 MG EC tablet Take 1 tablet (325 mg total) by mouth daily. 04/14/21 07/13/21  Pokhrel, Rebekah Chesterfield, MD  atorvastatin (LIPITOR) 80 MG tablet Take 1 tablet (80 mg total) by mouth daily. 06/14/21 07/14/21  Milagros Loll, MD  B Complex Vitamins (VITAMIN B COMPLEX) TABS Take 1 tabet by mouth once daily. 05/11/21   Angiulli, Mcarthur Rossetti, PA-C  Cholecalciferol 125 MCG (5000 UT) TABS Take 1 tablet (5,000 Units total) by mouth daily. 05/11/21   Angiulli, Mcarthur Rossetti, PA-C  Coenzyme Q10 (COQ-10) 100 MG CAPS Take 100 mg by mouth at bedtime.    [provider]  dantrolene (DANTRIUM) 50 MG capsule Take 1 capsule (50 mg total) by mouth at bedtime. 05/11/21   Angiulli, Mcarthur Rossetti, PA-C  diclofenac Sodium (VOLTAREN) 1 % GEL Apply 2 g topically 4 (four) times daily. 05/11/21   Angiulli, Mcarthur Rossetti, PA-C  docusate sodium (COLACE) 100 MG capsule Take 1 capsule (100 mg total) by mouth 2 (two) times daily. 04/13/21   Pokhrel, Rebekah Chesterfield, MD  glimepiride (AMARYL) 1 MG tablet Take 1 tablet (1 mg total) by mouth daily with breakfast.  05/11/21   Angiulli, Mcarthur Rossetti, PA-C  hydrochlorothiazide (HYDRODIURIL) 12.5 MG tablet Take 1 tablet (12.5 mg total) by mouth daily. 06/14/21   Milagros Loll, MD  lisinopril (ZESTRIL) 10 MG tablet Take 1 tablet (10 mg total) by mouth daily. 05/11/21   Angiulli, Mcarthur Rossetti, PA-C  magnesium oxide (MAG-OX) 400  MG tablet Take 2 tablets (800 mg total) by mouth at bedtime. 05/11/21   Angiulli, Mcarthur Rossetti, PA-C  magnesium oxide (MAG-OX) 400 (240 Mg) MG tablet Take 1 tablet (400 mg total) by mouth daily. 05/13/21   Angiulli, Mcarthur Rossetti, PA-C  Menaquinone-7 (VITAMIN K2 PO) Take 1 tablet by mouth daily.    [provider]  Omega-3 Fatty Acids (FISH OIL) 600 MG CAPS Take 600 mg by mouth daily.    [provider]  pantoprazole (PROTONIX) 40 MG tablet Take 1 tablet (40 mg total) by mouth daily. 05/11/21   Angiulli, Mcarthur Rossetti, PA-C  polyethylene glycol (MIRALAX / GLYCOLAX) 17 g packet Take 17 g by mouth daily. 04/14/21   Pokhrel, Rebekah Chesterfield, MD  tamsulosin (FLOMAX) 0.4 MG CAPS capsule Take 1 capsule (0.4 mg total) by mouth daily after supper. 05/11/21   Angiulli, Mcarthur Rossetti, PA-C  tiZANidine (ZANAFLEX) 2 MG tablet Take 3 tablets (6 mg total) by mouth daily. 05/11/21   Angiulli, Mcarthur Rossetti, PA-C    Allergies    Baclofen and Nickel  Review of Systems   Review of Systems  Constitutional:  Positive for fatigue. Negative for chills, diaphoresis and fever.  HENT:  Negative for congestion, rhinorrhea and sneezing.   Eyes: Negative.   Respiratory:  Negative for cough, chest tightness and shortness of breath.   Cardiovascular:  Negative for chest pain and leg swelling.  Gastrointestinal:  Negative for abdominal pain, blood in stool, diarrhea, nausea and vomiting.  Genitourinary:  Negative for difficulty urinating, flank pain, frequency and hematuria.  Musculoskeletal:  Negative for arthralgias and back pain.  Skin:  Negative for rash.  Neurological:  Positive for weakness. Negative for dizziness, speech  difficulty, numbness and headaches.   Physical Exam Updated Vital Signs BP (!) 173/92   Pulse 80   Temp 98.9 F (37.2 C) (Oral)   Resp 15   Ht 5\' 6"  (1.676 m)   Wt 77.1 kg   SpO2 95%   BMI 27.44 kg/m   Physical Exam Constitutional:      Appearance: She is well-developed.  HENT:     Head: Normocephalic and atraumatic.  Eyes:     Pupils: Pupils are equal, round, and reactive to light.  Cardiovascular:     Rate and Rhythm: Normal rate and regular rhythm.     Heart sounds: Normal heart sounds.  Pulmonary:     Effort: Pulmonary effort is normal. No respiratory distress.     Breath sounds: Normal breath sounds. No wheezing or rales.  Chest:     Chest wall: No tenderness.  Abdominal:     General: Bowel sounds are normal.     Palpations: Abdomen is soft.     Tenderness: There is no abdominal tenderness. There is no guarding or rebound.  Musculoskeletal:        General: Normal range of motion.     Cervical back: Normal range of motion and neck supple.  Lymphadenopathy:     Cervical: No cervical adenopathy.  Skin:    General: Skin is warm and dry.     Findings: No rash.  Neurological:     Mental Status: She is alert and oriented to person, place, and time.     Comments: Positive right side paralysis of the upper and lower extremity.  She can not really move either limb against gravity.  She has 4 out of 5 strength in the left upper and lower extremities.    ED Results / Procedures / Treatments  Labs (all labs ordered are listed, but only abnormal results are displayed) Labs Reviewed  BASIC METABOLIC PANEL - Abnormal; Notable for the following components:      Result Value   CO2 21 (*)    Glucose, Bld 151 (*)    Creatinine, Ser 1.14 (*)    GFR, Estimated 53 (*)    All other components within normal limits  CBG MONITORING, ED - Abnormal; Notable for the following components:   Glucose-Capillary 136 (*)    All other components within normal limits  CBC  URINALYSIS,  ROUTINE W REFLEX MICROSCOPIC    EKG EKG Interpretation  Date/Time:  Saturday June 17 2021 13:01:16 EDT Ventricular Rate:  90 PR Interval:  142 QRS Duration: 98 QT Interval:  356 QTC Calculation: 435 R Axis:   -75 Text Interpretation: Normal sinus rhythm Left anterior fascicular block Left ventricular hypertrophy with repolarization abnormality ( R in aVL , Cornell product ) Abnormal ECG since last tracing no significant change Confirmed by Rolan Bucco [phone]) on 06/17/2021 2:38:31 PM  Radiology CT Head Wo Contrast  Result Date: 06/17/2021 CLINICAL DATA:  Neuro deficit, acute stroke suspected. Weakness for 1 week. History of stroke with right-sided baseline deficits. EXAM: CT HEAD WITHOUT CONTRAST TECHNIQUE: Contiguous axial images were obtained from the base of the skull through the vertex without intravenous contrast. COMPARISON:  CT head and CT angiogram 04/06/2021. FINDINGS: Brain: There is no evidence of acute intracranial hemorrhage, mass lesion, brain edema or new extra-axial fluid collection. 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 Stable atrophy with prominence of the ventricles and subarachnoid spaces. There are chronic small vessel ischemic changes in the periventricular white matter. The previously demonstrated infarct in the posterior left frontal lobe demonstrates expected evolution with associated encephalomalacia. No stroke extension identified. Scattered low-density within the basal ganglia and thalami bilaterally appears unchanged. Vascular: Intracranial vascular calcifications. No hyperdense vessel identified. Skull: Negative for fracture or focal lesion. Sinuses/Orbits: Chronic dependent opacity in the left division of the sphenoid sinus, unchanged. The visualized paranasal sinuses, mastoid air cells and middle ears are otherwise clear. Other: Focal soft tissue protuberance in the midline frontal scalp appears unchanged.  IMPRESSION: 1. No acute intracranial findings. 2. Expected evolution of previously demonstrated infarct in the posterior left frontal lobe. Chronic atrophy and small vessel ischemic changes. Electronically Signed   By: [name] Bullocks M.D.   On: 06/17/2021 15:00    Procedures Procedures   Medications Ordered in ED Medications - No data to display  ED Course  I have reviewed the triage vital signs and the nursing notes.  Pertinent labs & imaging results that were available during my care of the patient were reviewed by me and considered in my medical decision making (see chart for details).    MDM Rules/Calculators/A&P                           Patient is a 65 year old female who presents with weakness.  Seems to be more generalized although given her prior stroke, its hard to tell.  Her head CT shows no acute abnormalities.  Her labs are nonconcerning.  Urinalysis is pending.  MRI brain ordered.  If negative for acute abnormality, patient can likely go home.  Dr. Charm Barges to take over care pending these tests. Final Clinical Impression(s) / ED Diagnoses Final diagnoses:  Weakness    Rx / DC Orders ED Discharge Orders  None        Rolan Bucco, MD 06/17/21 1610

## 2021-06-17 NOTE — ED Notes (Signed)
Pt c/o right arm pain, denies any new weakness, just the new pain in her arm.

## 2021-06-17 NOTE — ED Provider Notes (Signed)
Signout from Dr. Fredderick Phenix.  65 year old female with prior stroke and right-sided deficits here with increased generalized weakness.  She is pending MRI brain.  If negative can likely can be discharged to outpatient neurology. Physical Exam  BP (!) 173/92   Pulse 80   Temp 98.9 F (37.2 C) (Oral)   Resp 15   Ht 5\' 6"  (1.676 m)   Wt 77.1 kg   SpO2 95%   BMI 27.44 kg/m   Physical Exam  ED Course/Procedures     Procedures  MDM  Consulted neurology Dr. .  He felt with the new strokes patient would benefit from being admitted to the hospital for further work-up.  Reviewed with patient and she is comfortable plan.  Discussed with internal medicine team who will evaluate the patient for unassigned admission.       Thomasena Edis, MD 06/18/21 1101

## 2021-06-18 ENCOUNTER — Inpatient Hospital Stay (HOSPITAL_COMMUNITY): Payer: Medicare Other

## 2021-06-18 DIAGNOSIS — R531 Weakness: Secondary | ICD-10-CM

## 2021-06-18 DIAGNOSIS — I639 Cerebral infarction, unspecified: Secondary | ICD-10-CM | POA: Diagnosis not present

## 2021-06-18 LAB — BASIC METABOLIC PANEL
Anion gap: 11 (ref 5–15)
BUN: 19 mg/dL (ref 8–23)
CO2: 24 mmol/L (ref 22–32)
Calcium: 9.1 mg/dL (ref 8.9–10.3)
Chloride: 102 mmol/L (ref 98–111)
Creatinine, Ser: 1.01 mg/dL — ABNORMAL HIGH (ref 0.44–1.00)
GFR, Estimated: >60 mL/min (ref >60)
Glucose, Bld: 95 mg/dL (ref 70–99)
Potassium: 3.4 mmol/L — ABNORMAL LOW (ref 3.5–5.1)
Sodium: 137 mmol/L (ref 135–145)

## 2021-06-18 LAB — LIPID PANEL
Cholesterol: 201 mg/dL — ABNORMAL HIGH (ref 0–200)
HDL: 40 mg/dL — ABNORMAL LOW (ref >40)
LDL Cholesterol: 133 mg/dL — ABNORMAL HIGH (ref 0–99)
Total CHOL/HDL Ratio: 5 RATIO
Triglycerides: 140 mg/dL (ref <150)
VLDL: 28 mg/dL (ref 0–40)

## 2021-06-18 LAB — URINALYSIS, ROUTINE W REFLEX MICROSCOPIC
Bilirubin Urine: NEGATIVE
Glucose, UA: NEGATIVE mg/dL
Hgb urine dipstick: NEGATIVE
Ketones, ur: 5 mg/dL — AB
Nitrite: POSITIVE — AB
Protein, ur: NEGATIVE mg/dL
Specific Gravity, Urine: 1.017 (ref 1.005–1.030)
pH: 6 (ref 5.0–8.0)

## 2021-06-18 LAB — CBC
HCT: 43.7 % (ref 36.0–46.0)
Hemoglobin: 14.5 g/dL (ref 12.0–15.0)
MCH: 31.9 pg (ref 26.0–34.0)
MCHC: 33.2 g/dL (ref 30.0–36.0)
MCV: 96 fL (ref 80.0–100.0)
Platelets: 284 10*3/uL (ref 150–400)
RBC: 4.55 MIL/uL (ref 3.87–5.11)
RDW: 13.2 % (ref 11.5–15.5)
WBC: 5.2 10*3/uL (ref 4.0–10.5)
nRBC: 0 % (ref 0.0–0.2)

## 2021-06-18 LAB — RAPID URINE DRUG SCREEN, HOSP PERFORMED
Amphetamines: NOT DETECTED
Barbiturates: NOT DETECTED
Benzodiazepines: NOT DETECTED
Cocaine: NOT DETECTED
Opiates: NOT DETECTED
Tetrahydrocannabinol: NOT DETECTED

## 2021-06-18 LAB — TSH: TSH: 1.909 u[IU]/mL (ref 0.350–4.500)

## 2021-06-18 LAB — GLUCOSE, CAPILLARY
Glucose-Capillary: 80 mg/dL (ref 70–99)
Glucose-Capillary: 132 mg/dL — ABNORMAL HIGH (ref 70–99)
Glucose-Capillary: 170 mg/dL — ABNORMAL HIGH (ref 70–99)
Glucose-Capillary: 193 mg/dL — ABNORMAL HIGH (ref 70–99)

## 2021-06-18 LAB — HEMOGLOBIN A1C
Hgb A1c MFr Bld: 5.7 % — ABNORMAL HIGH (ref 4.8–5.6)
Mean Plasma Glucose: 116.89 mg/dL

## 2021-06-18 LAB — MAGNESIUM: Magnesium: 2 mg/dL (ref 1.7–2.4)

## 2021-06-18 MED ORDER — ENOXAPARIN SODIUM 40 MG/0.4ML IJ SOSY: 40.0000 mg | PREFILLED_SYRINGE | Freq: Every day | INTRAMUSCULAR | Status: DC

## 2021-06-18 MED ORDER — INSULIN ASPART 100 UNIT/ML IJ SOLN
0.0000 [IU] | Freq: Every day | INTRAMUSCULAR | Status: DC
  Administered 2021-06-23: 2 [IU] via SUBCUTANEOUS

## 2021-06-18 MED ORDER — IOHEXOL 350 MG/ML SOLN
75.0000 mL | Freq: Once | INTRAVENOUS | Status: AC | PRN
  Administered 2021-06-18: 75 mL via INTRAVENOUS

## 2021-06-18 MED ORDER — POTASSIUM CHLORIDE CRYS ER 20 MEQ PO TBCR
40.0000 meq | EXTENDED_RELEASE_TABLET | Freq: Two times a day (BID) | ORAL | Status: DC
  Administered 2021-06-18 – 2021-06-20 (×6): 40 meq via ORAL
  Filled 2021-06-18 (×6): qty 2

## 2021-06-18 MED ORDER — ENOXAPARIN SODIUM 40 MG/0.4ML IJ SOSY
40.0000 mg | PREFILLED_SYRINGE | Freq: Every day | INTRAMUSCULAR | Status: DC
Start: 2021-06-18 — End: 2021-06-20
  Administered 2021-06-18 – 2021-06-19 (×2): 40 mg via SUBCUTANEOUS
  Filled 2021-06-18 (×3): qty 0.4

## 2021-06-18 MED ORDER — TICAGRELOR 90 MG PO TABS
90.0000 mg | ORAL_TABLET | Freq: Two times a day (BID) | ORAL | Status: DC
  Administered 2021-06-18 – 2021-06-19 (×3): 90 mg via ORAL
  Filled 2021-06-18 (×3): qty 1

## 2021-06-18 MED ORDER — TICAGRELOR 90 MG PO TABS
90.0000 mg | ORAL_TABLET | Freq: Every day | ORAL | Status: DC
  Administered 2021-06-18: 90 mg via ORAL
  Filled 2021-06-18: qty 1

## 2021-06-18 MED ORDER — METHOCARBAMOL 500 MG PO TABS
500.0000 mg | ORAL_TABLET | Freq: Once | ORAL | Status: AC
  Administered 2021-06-19: 500 mg via ORAL
  Filled 2021-06-18: qty 1

## 2021-06-18 MED ORDER — INSULIN ASPART 100 UNIT/ML IJ SOLN
0.0000 [IU] | Freq: Three times a day (TID) | INTRAMUSCULAR | Status: DC
  Administered 2021-06-18: 2 [IU] via SUBCUTANEOUS
  Administered 2021-06-18 – 2021-06-19 (×2): 1 [IU] via SUBCUTANEOUS
  Administered 2021-06-19: 2 [IU] via SUBCUTANEOUS
  Administered 2021-06-20 (×2): 1 [IU] via SUBCUTANEOUS
  Administered 2021-06-20 – 2021-06-21 (×2): 2 [IU] via SUBCUTANEOUS
  Administered 2021-06-21: 1 [IU] via SUBCUTANEOUS
  Administered 2021-06-21: 2 [IU] via SUBCUTANEOUS
  Administered 2021-06-22 – 2021-06-23 (×4): 1 [IU] via SUBCUTANEOUS
  Administered 2021-06-24: 2 [IU] via SUBCUTANEOUS
  Administered 2021-06-24 (×2): 1 [IU] via SUBCUTANEOUS
  Administered 2021-06-25: 2 [IU] via SUBCUTANEOUS
  Administered 2021-06-25 – 2021-06-26 (×2): 1 [IU] via SUBCUTANEOUS
  Administered 2021-06-26 – 2021-06-27 (×3): 2 [IU] via SUBCUTANEOUS
  Administered 2021-06-27: 3 [IU] via SUBCUTANEOUS
  Administered 2021-06-28: 1 [IU] via SUBCUTANEOUS
  Administered 2021-06-28: 2 [IU] via SUBCUTANEOUS
  Administered 2021-06-29 – 2021-06-30 (×4): 1 [IU] via SUBCUTANEOUS

## 2021-06-18 NOTE — Care Management (Addendum)
Acknowledge consult for CIR. Spoke w CIR admission coordinator who clarifies that patient will have to be off COVID precautions and be inpatient (with qualifying inpatient diagnosis confirmed with UR team) prior  to being eligible for CIR candidacy.  Patient was recently in CIR from 8/18 to 9/16. Was set up w [facility] at time of CIR DC.  Medical team updated.

## 2021-06-18 NOTE — Evaluation (Signed)
Occupational Therapy Evaluation Patient Details Name: Jade [name] MRN: [id number] DOB: 01/08/1956 Today's Date: 06/18/2021   History of Present Illness Pt is a 65 y.o. female who presented with increased weakness, bilaterally. MRI revealed no acute findings; late and subacute findings in corpos callosum noton August 2022 findings. More imaging ordered. PMH: arthritis, CKD, COPD, CVA, fibromyalgia, gout, HTN, migraine, OSA, DM2   Clinical Impression   Pt PTA: Pt living with family; son and husband. Son recently hurt back and is unable to physically assist pt OOB safely. Pt currently, limited by decreased strength in L side, decreased ability to care for self, increased coordination deficits. Pt set-upA to maxA +2 for self care tasks. Transfer: B/l knees blocked; R side heavy lean; taking a few steps from bed to recliner; heavy maxA; stedy would be safest- RN staff aware. Pt with strength deficits in L >R side as R side affected from previous CVAs. Pt would benefit from continued OT skilled services. OT following acutely.     Recommendations for follow up therapy are one component of a multi-disciplinary discharge planning process, led by the attending physician.  Recommendations may be updated based on patient status, additional functional criteria and insurance authorization.   Follow Up Recommendations  CIR    Equipment Recommendations  Wheelchair (measurements OT);Wheelchair cushion (measurements OT) (with elevating footrests)    Recommendations for Other Services Rehab consult     Precautions / Restrictions Precautions Precautions: Fall;Other (comment) Precaution Comments: prior CVA R side affected from previous CVAs Restrictions Weight Bearing Restrictions: No      Mobility Bed Mobility Overal bed mobility: Needs Assistance Bed Mobility: Rolling;Sidelying to Sit Rolling: Max assist;+2 for physical assistance Sidelying to sit: Max assist;+2 for physical assistance;+2 for  safety/equipment;HOB elevated       General bed mobility comments: Pt able to assist minimally due to R side near flaccidity and L side new weakness    Transfers Overall transfer level: Needs assistance Equipment used: 2 person hand held assist Transfers: Sit to/from [employer] Sit to Stand: Max assist;+2 physical assistance;+2 safety/equipment Stand pivot transfers: Max assist;+2 physical assistance;+2 safety/equipment       General transfer comment: B/l knees blocked; R side heavy lean; taking a few steps from bed to recliner; heavy maxA; stedy would be safest- Engineer, manufacturing aware.    Balance Overall balance assessment: Needs assistance Sitting-balance support: Single extremity supported;Feet supported Sitting balance-Leahy Scale: Poor Sitting balance - Comments: leaning back slowly unable to assist with repositioning Postural control: Posterior lean Standing balance support: Bilateral upper extremity supported Standing balance-Leahy Scale: Poor Standing balance comment: heavy R sided lean                           ADL either performed or assessed with clinical judgement   ADL Overall ADL's : Needs assistance/impaired Eating/Feeding: Set up;Sitting Eating/Feeding Details (indicate cue type and reason): containers opened for pt Grooming: Set up;Sitting   Upper Body Bathing: Moderate assistance;Sitting   Lower Body Bathing: Maximal assistance;Sitting/lateral leans;Cueing for safety;Cueing for sequencing;+2 for physical assistance;+2 for safety/equipment;Sit to/from stand   Upper Body Dressing : Moderate assistance;Sitting   Lower Body Dressing: Maximal assistance;+2 for physical assistance;+2 for safety/equipment;Sitting/lateral leans;Sit to/from stand;Cueing for safety;Cueing for sequencing   Toilet Transfer: Maximal assistance;+2 for physical assistance;+2 for safety/equipment;Stand-pivot Toilet Transfer Details (indicate cue type and reason):  simulated to recliner Toileting- Clothing Manipulation and Hygiene: Total assistance;+2 for safety/equipment;+2 for physical assistance;Sit to/from stand;Sitting/lateral  lean Toileting - Clothing Manipulation Details (indicate cue type and reason): incontinent     Functional mobility during ADLs: Maximal assistance;+2 for physical assistance;+2 for safety/equipment General ADL Comments: Pt limited by decreased strength, decreased ability to care for self, increased coordination deficits. Pt set-upA to maxA +2 for self care tasks.     Vision Baseline Vision/History: 0 No visual deficits Ability to See in Adequate Light: 0 Adequate Patient Visual Report: No change from baseline Vision Assessment?: No apparent visual deficits     Perception     Praxis      Pertinent Vitals/Pain Pain Assessment: 0-10 Pain Score: 5  Pain Location: L shoulder Pain Descriptors / Indicators: Tender;Discomfort Pain Intervention(s): Monitored during session;Repositioned     Hand Dominance Right   Extremity/Trunk Assessment Upper Extremity Assessment Upper Extremity Assessment: RUE deficits/detail;LUE deficits/detail RUE Deficits / Details: deficits from previous CVA; edematous arm and hand, <10* AAROM in UE; painful; RUE Coordination: decreased fine motor;decreased gross motor LUE Deficits / Details: LUE decreased ROM, strength is 3-/5 MM grade LUE Coordination: decreased fine motor;decreased gross motor   Lower Extremity Assessment Lower Extremity Assessment: Defer to PT evaluation;RLE deficits/detail RLE Deficits / Details: weakness from previous CVA RLE Coordination: decreased fine motor;decreased gross motor   Cervical / Trunk Assessment Cervical / Trunk Assessment: Kyphotic   Communication Communication Communication: No difficulties   Cognition Arousal/Alertness: Awake/alert Behavior During Therapy: WFL for tasks assessed/performed;Flat affect Overall Cognitive Status: Within Functional  Limits for tasks assessed                                 General Comments: A/O x4 able to explain PLOF and PMH   General Comments  184/88 supine beginning of session    Exercises     Shoulder Instructions      Home Living Family/patient expects to be discharged to:: Private residence Living Arrangements: Children Available Help at Discharge: Family;Available PRN/intermittently Type of Home: House Home Access: Stairs to enter [employer] of Steps: 1 threshold Entrance Stairs-Rails: None Home Layout: One level     Bathroom Shower/Tub: Tub/shower unit;Other (comment) (inaccessible)   Bathroom Toilet: Handicapped height Bathroom Accessibility: No   Home Equipment: Walker - 2 wheels;Bedside commode   Additional Comments: Husband works during the day, son is supposed to provide assist while he is at work.      Prior Functioning/Environment Level of Independence: Needs assistance  Gait / Transfers Assistance Needed: not walking; ADL's / Homemaking Assistance Needed: incontinent; L side stronger than R side; assist with ADL.Wearing t shirts   Comments: doesn't drive        OT Problem List: Decreased strength;Decreased activity tolerance;Impaired balance (sitting and/or standing);Decreased coordination;Decreased safety awareness;Cardiopulmonary status limiting activity;Pain;Increased edema;Impaired UE functional use      OT Treatment/Interventions: Self-care/ADL training;Therapeutic exercise;Energy conservation;DME and/or AE instruction;Therapeutic activities;Cognitive remediation/compensation;Patient/family education;Balance training    OT Goals(Current goals can be found in the care plan section) Acute Rehab OT Goals Patient Stated Goal: to go to rehab for therapy OT Goal Formulation: With patient Time For Goal Achievement: 07/02/21 Potential to Achieve Goals: Good ADL Goals Pt Will Perform Grooming: with set-up;standing;sitting Pt/caregiver  will Perform Home Exercise Program: Increased strength;Left upper extremity;With theraband;With written HEP provided;With Supervision Additional ADL Goal #1: Pt will increase to tolerate x6 mins of OOB ADL tasks with minA overall. Additional ADL Goal #2: Student will tolerate standing x1 min with use of stedy or least restrictive  AD with maxA overall.  OT Frequency: Min 2X/week   Barriers to D/C:            Co-evaluation              AM-PAC OT "6 Clicks" Daily Activity     Outcome Measure Help from another person eating meals?: A Little Help from another person taking care of personal grooming?: A Little Help from another person toileting, which includes using toliet, bedpan, or urinal?: Total Help from another person bathing (including washing, rinsing, drying)?: A Lot Help from another person to put on and taking off regular upper body clothing?: A Lot Help from another person to put on and taking off regular lower body clothing?: Total 6 Click Score: 12   End of Session Equipment Utilized During Treatment: Gait belt Nurse Communication: Mobility status  Activity Tolerance: Patient tolerated treatment well;Patient limited by fatigue Patient left: in chair;with call bell/phone within reach;with chair alarm set  OT Visit Diagnosis: Unsteadiness on feet (R26.81);Muscle weakness (generalized) (M62.81);Hemiplegia and hemiparesis Hemiplegia - Right/Left: Right Hemiplegia - dominant/non-dominant: Dominant Hemiplegia - caused by: Unspecified                Time: [id number] OT Time Calculation (min): 37 min Charges:  OT General Charges $OT Visit: 1 Visit OT Evaluation $OT Eval Moderate Complexity: 1 Mod  Flora Lipps, OTR/L Acute Rehabilitation Services Pager: [phone] Office: [phone]  Kaylianna Detert C 06/18/2021, 9:07 AM

## 2021-06-18 NOTE — Progress Notes (Signed)
PT Evaluation   06/18/21 1050  PT Visit Information  Last PT Received On 06/18/21  Assistance Needed +2  PT/OT/SLP Co-Evaluation/Treatment Yes  Reason for Co-Treatment For patient/therapist safety;To address functional/ADL transfers  PT goals addressed during session Mobility/safety with mobility;Balance  History of Present Illness Pt is a 65 y.o. female who presented with increased weakness, bilaterally. MRI revealed no acute findings; late and subacute findings in corpus callosum not on August 2022 findings. Pt also found to be COVID+.  PMH: arthritis, CKD, COPD, CVA, fibromyalgia, gout, HTN, migraine, OSA, DM2  Precautions  Precautions Fall;Other (comment)  Precaution Comments prior CVA R side affected from previous CVAs  Restrictions  Weight Bearing Restrictions No  Home Living  Family/patient expects to be discharged to: Private residence  Living Arrangements Children  Available Help at Discharge Family;Available PRN/intermittently  Type of Home House  Home Access Stairs to enter  Entrance Stairs-Number of Steps 1 threshold  Entrance Stairs-Rails None  Home Layout One level  Bathroom Shower/Tub Tub/shower unit;Other (comment) (inaccessible)  Bathroom Toilet Handicapped height  Bathroom Accessibility No  Home Equipment Walker - 2 wheels;BSC;Wheelchair - manual  Additional Comments Husband works during the day, son is supposed to provide assist while he is at work.  Prior Function  Level of Independence Needs assistance  Gait / Transfers Assistance Needed Using [facility] for mobility and required assist for transfers from son. Reports increased difficulty transferring here lately  ADL's / Homemaking Assistance Needed incontinent; L side stronger than R side; assist with ADL.Wearing t shirts  Comments doesn't drive  Communication  Communication No difficulties  Pain Assessment  Pain Assessment 0-10  Pain Score 5  Pain Location L shoulder  Pain Descriptors / Indicators  Tender;Discomfort  Pain Intervention(s) Limited activity within patient's tolerance;Monitored during session;Repositioned  Cognition  Arousal/Alertness Awake/alert  Behavior During Therapy WFL for tasks assessed/performed;Flat affect  Overall Cognitive Status Within Functional Limits for tasks assessed  General Comments A/O x4 able to explain PLOF and PMH  Upper Extremity Assessment  Upper Extremity Assessment Defer to OT evaluation  Lower Extremity Assessment  Lower Extremity Assessment RLE deficits/detail;Generalized weakness  RLE Deficits / Details RLE deficits at baseline from previous CVA.  RLE Coordination decreased fine motor;decreased gross motor  LLE Deficits / Details Grossly 3/5 throughout  Cervical / Trunk Assessment  Cervical / Trunk Assessment Kyphotic  Bed Mobility  Overal bed mobility Needs Assistance  Bed Mobility Rolling;Sidelying to Sit  Rolling Max assist;+2 for physical assistance  Sidelying to sit Max assist;+2 for physical assistance;+2 for safety/equipment;HOB elevated  General bed mobility comments Pt able to assist minimally due to R side near flaccidity and L side new weakness  Transfers  Overall transfer level Needs assistance  Equipment used 2 person hand held assist  Transfers Sit to/from [employer] Transfers  Sit to Stand Max assist;+2 physical assistance;+2 safety/equipment  Stand pivot transfers Max assist;+2 physical assistance;+2 safety/equipment  General transfer comment B/l knees blocked; R side heavy lean; taking a few steps from bed to recliner; heavy maxA; stedy would be safest- RN staff aware.  Balance  Overall balance assessment Needs assistance  Sitting-balance support Single extremity supported;Feet supported  Sitting balance-Leahy Scale Poor  Sitting balance - Comments leaning back slowly unable to assist with repositioning  Postural control Posterior lean  Standing balance support Bilateral upper extremity supported  Standing  balance-Leahy Scale Zero  Standing balance comment heavy R sided lean max A +2 for steadying  PT - End of Session  Equipment  Utilized During Treatment Gait belt  Activity Tolerance Patient tolerated treatment well  Patient left in chair;with call bell/phone within reach;with chair alarm set  Nurse Communication Mobility status  PT Assessment  PT Recommendation/Assessment Patient needs continued PT services  PT Visit Diagnosis Muscle weakness (generalized) (M62.81);Unsteadiness on feet (R26.81);Difficulty in walking, not elsewhere classified (R26.2)  PT Problem List Decreased strength;Decreased range of motion;Decreased activity tolerance;Decreased balance;Decreased mobility  PT Plan  PT Frequency (ACUTE ONLY) Min 3X/week  PT Treatment/Interventions (ACUTE ONLY) DME instruction;Gait training;Functional mobility training;Therapeutic activities;Therapeutic exercise;Balance training;Patient/family education;Wheelchair mobility training  AM-PAC PT "6 Clicks" Mobility Outcome Measure (Version 2)  Help needed turning from your back to your side while in a flat bed without using bedrails? 1  Help needed moving from lying on your back to sitting on the side of a flat bed without using bedrails? 1  Help needed moving to and from a bed to a chair (including a wheelchair)? 1  Help needed standing up from a chair using your arms (e.g., wheelchair or bedside chair)? 1  Help needed to walk in hospital room? 1  Help needed climbing 3-5 steps with a railing?  1  6 Click Score 6  Consider Recommendation of Discharge To: CIR/SNF/LTACH  Progressive Mobility  What is the highest level of mobility based on the progressive mobility assessment? Level 2 (Chairfast) - Balance while sitting on edge of bed and cannot stand  Mobility Out of bed for toileting;Out of bed to chair with meals  PT Recommendation  Follow Up Recommendations CIR  PT equipment Other (comment) (hoyer lift with pad)  Individuals Consulted   Consulted and Agree with Results and Recommendations Patient  Acute Rehab PT Goals  Patient Stated Goal to go to rehab for therapy  PT Goal Formulation With patient  Time For Goal Achievement 07/02/21  Potential to Achieve Goals Fair  PT Time Calculation  PT Start Time (ACUTE ONLY) 0817  PT Stop Time (ACUTE ONLY) 0843  PT Time Calculation (min) (ACUTE ONLY) 26 min  PT General Charges  $$ ACUTE PT VISIT 1 Visit  PT Evaluation  $PT Eval Moderate Complexity 1 Mod  Written Expression  Dominant Hand Right   Pt admitted secondary to problem above with deficits above. Pt with increased weakness in RLE>LLE, poor balance, and difficulty with coordination. Required max A +2 for bed mobility and transfers this session. Pt reports she has had a decline in function since leaving rehab. Recommending CIR level therapies at d/c to increase independence and safety with functional mobility. Will continue to follow acutely.   Farley Ly, PT, DPT  Acute Rehabilitation Services  Pager: [phone] Office: [phone]

## 2021-06-18 NOTE — Progress Notes (Signed)
Inpatient Rehab Admissions Coordinator Note:   Per PT/OT patient was screened for CIR candidacy by Laityn Bensen Luvenia Starch, CCC-SLP. Noted COVID +10/22. Patients are eligible to be considered for admission to CIR when cleared from airborne precautions by acute MD or Infectious Disease. Otherwise, they will need be >20 days from their positive test with recovery/improvement in symptoms or 2 negative tests. Pt also requiring "heavy Max A" for transfers with the recommendation for the use of stedy by therapists. Pt may have potential to progress to becoming a potential CIR candidate, so CIR admissions team will follow for medical workup and progress with therapies. Consult order will be placed if pt appears to be an appropriate candidate.     Wolfgang Phoenix, MS, CCC-SLP Admissions Coordinator [phone] 06/18/21 1:01 PM

## 2021-06-18 NOTE — Progress Notes (Addendum)
Jade [name] is 65 y.o. with pertinent PMH HTN, HLD, T2DM, former smoker ,multiple CVA with recent CVA in August , and left carotid artery stenosis s/p left transcarotid artery revascularization (04/06/2021) ,who presented with weakness and admitted for subacute stroke.  Subjective:   No acute concerns voiced. She stated the hospital food is not good. Stated she was having pain in the right arm.   Objective:  Vital signs in last 24 hours: Vitals:   06/18/21 0041 06/18/21 0042 06/18/21 0423 06/18/21 0739  BP: (!) 176/75  (!) 186/82 (!) 168/79  Pulse: 70  72 66  Resp: 18  20 15   Temp: 98.8 F (37.1 C)  97.8 F (36.6 C) 98.1 F (36.7 C)  TempSrc: Oral   Oral  SpO2: 98%  96% 98%  Weight:  72.6 kg    Height:       Supplemental O2: Room Air SpO2: 98 %  Physical Exam General: NAD Head: Normocephalic without scalp lesions.  Eyes: Conjunctivae pink, sclerae white, without jaundice.  Mouth: Lips normal color, without lesions. Moist mucus membrane. Neck: Neck supple with full range of motion (ROM).  Lungs: CTAB, no wheeze, rhonchi or rales.  Cardiovascular: Normal heart sounds, no r/m/g, 2+ pulses in all extremities Abdomen: No TTP, normal bowel sounds MSK: No asymmetry or muscle atrophy. Shoulder shrug absent on right but intact on left. 1-2 /5 right upper and lower extremity. 4/5 left upper and lower extremity.  Skin: warm, dry good skin turgor, no lesions Neuro: Alert and oriented. CN II-XII intact. Sensation intact bilaterally except in the feet. Patient not able to tell position sense.  Psych: Normal mood and normal affect    CBC Latest Ref Rng & Units 06/18/2021 06/17/2021 06/14/2021  WBC 4.0 - 10.5 K/uL 5.2 6.1 8.7  Hemoglobin 12.0 - 15.0 g/dL 06/16/2021 73.2 20.2  Hematocrit 36.0 - 46.0 % 43.7 45.3 44.0  Platelets 150 - 400 K/uL 284 304 370    CMP Latest Ref Rng & Units 06/18/2021 06/17/2021 06/14/2021  Glucose 70 - 99 mg/dL 95 06/16/2021) 706(C)  BUN 8 - 23 mg/dL 19 18 20    Creatinine 0.44 - 1.00 mg/dL 376(E) ) 8.31(D  Sodium 135 - 145 mmol/L 137 136 142  Potassium 3.5 - 5.1 mmol/L 3.4(L) 3.6 3.6  Chloride 98 - 111 mmol/L 102 101 105  CO2 22 - 32 mmol/L 24 21(L) 26  Calcium 8.9 - 10.3 mg/dL 9.1 9.3 1.76(H  Total Protein 6.5 - 8.1 g/dL - - -  Total Bilirubin 0.3 - 1.2 mg/dL - - -  Alkaline Phos 38 - 126 U/L - - -  AST 15 - 41 U/L - - -  ALT 0 - 44 U/L - - -  Lipid Panel     Component Value Date/Time   CHOL 201 (H) 06/18/2021 0231   TRIG 140 06/18/2021 0231   HDL 40 (L) 06/18/2021 0231   CHOLHDL 5.0 06/18/2021 0231   VLDL 28 06/18/2021 0231   LDLCALC 133 (H) 06/18/2021 0231   LDLDIRECT 164.7 08/24/2013 0848     Filed Weights   06/17/21 1333 06/18/21 0042  Weight: 77.1 kg 72.6 kg    No intake or output data in the 24 hours ending 06/18/21 0939 Net IO Since Admission: No IO data has been entered for this period [06/18/21 0939]  Assessment/Plan: Jade [name] is 65 y.o. with pertinent PMH HTN, HLD, T2DM, former smoker ,multiple CVA with recent CVA in August , and left carotid artery stenosis  s/p left transcarotid artery revascularization (04/06/2021) ,who presented with weakness and admitted for subacute stroke.   Subacute CVA Weakness Patient presented with increased weakness and found to have subacute CVA. Most likely in the setting of non-compliance as patient states she was out of all her medications for a while and upon further clarifications stated about 2-3 weeks.  - Neuro following - Control modifiable risk factors: HTN, HLD, T2DM - ASA 81 mg daily - continue Atorvastatin 80 mg - BP goal: <130/90 - HBAIC and Lipid profile - Telemetry monitoring - Frequent neuro checks - Follow neuro recs  Hypokalemia Patient's K is 3.4. Magnesium wnl.  -Replete for a goal of 4.  Covid-19  - Asymptomatic , positive on screening test.Has not received Covid Vaccine. -Continue to monitor. If patient develops hypoxia will initiate  steroids.  Hypertension -Home medications amlodipine 10 mg, lisinopril 10 mg, HCTZ 12.5. Reports being out for a few weeks due to issues obtaining them.  - BP goal as above, <130/90  - lisinopril 10 mg, HCTZ 12.5 mg   Hyperlipidemia - Chronic lipid panel showing cholesterol 201, and LDL 133. - Continue Atorvastatin 80 mg   CKD Stage 3a -Baseline creatinine 1.1-1.3. Cr 1.14 on admission.  - Continue to monitor   Type 2 Diabetes Mellitus  - Hemoglobin A1c 6.5 03/2021, home medication glimepiride. HbA1c 5.7 06/18/21. CBGs <140. - SSI-S - Check Hemoglobin A1c as mentioned above   Diet: Carb-Modified VTE: Enoxaparin IVF: None,None Code: DNR   Prior to Admission Living Arrangement: Home, living with assistance from son Anticipated Discharge Location: CIR per PT/OT recommendations Barriers to Discharge: Workup as above, placement   Dispo: Admit patient to Inpatient with expected length of stay greater than 2 midnights.  Gwenevere Abbot, MD Eligha Bridegroom. [facility] Internal Medicine Residency, PGY-1  Pager: [phone] After 5 pm and on weekends: Please call the on-call pager

## 2021-06-18 NOTE — Progress Notes (Addendum)
STROKE TEAM PROGRESS NOTE   INTERVAL HISTORY Nobody is at the bedside.  Patient states right upper extremity pain is improved but she still has weakness.  MRI scan of the brain shows subacute infarcts involving bilateral splenium of the corpus callosum which are new compared to previous MRI from August 2022.  Late subacute left parietal cortical and subcortical infarct is noted with expected evolutionary change compared with previous MRI from August 2022.  LDL cholesterol 137 mg percent.  Hemoglobin A1c is 5.7.  Follow-up carotid ultrasound and transcranial Doppler studies are pending Vitals:   06/18/21 0423 06/18/21 0739 06/18/21 1116 06/18/21 1133  BP: (!) 186/82 (!) 168/79 (!) 170/90 (!) 152/92  Pulse: 72 66  84  Resp: 20 15 (!) 23 19  Temp: 97.8 F (36.6 C) 98.1 F (36.7 C)  97.9 F (36.6 C)  TempSrc:  Oral  Oral  SpO2: 96% 98%    Weight:      Height:       CBC:  Recent Labs  Lab 06/14/21 2144 06/17/21 1305 06/18/21 0231  WBC 8.7 6.1 5.2  NEUTROABS 5.5  --   --   HGB 14.5 15.0 14.5  HCT 44.0 45.3 43.7  MCV 97.3 97.2 96.0  PLT 370 304 284   Basic Metabolic Panel:  Recent Labs  Lab 06/17/21 1305 06/18/21 0231  NA 136 137  K 3.6 3.4*  CL 101 102  CO2 21* 24  GLUCOSE 151* 95  BUN 18 19  CREATININE 1.14* 1.01*  CALCIUM 9.3 9.1  MG  --  2.0   Lipid Panel:  Recent Labs  Lab 06/18/21 0231  CHOL 201*  TRIG 140  HDL 40*  CHOLHDL 5.0  VLDL 28  LDLCALC 161*   HgbA1c:  Recent Labs  Lab 06/18/21 0231  HGBA1C 5.7*   Urine Drug Screen: No results for input(s): LABOPIA, COCAINSCRNUR, LABBENZ, AMPHETMU, THCU, LABBARB in the last 168 hours.  Alcohol Level No results for input(s): ETH in the last 168 hours.  IMAGING past 24 hours MR BRAIN WO CONTRAST  Result Date: 06/17/2021 CLINICAL DATA:  Neuro deficit, acute, stroke suspected.  Weakness. EXAM: MRI HEAD WITHOUT CONTRAST TECHNIQUE: Multiplanar, multiecho pulse sequences of the brain and surrounding structures  were obtained without intravenous contrast. COMPARISON:  CT earlier same day.  MRI 04/06/2021. FINDINGS: Brain: Extensive chronic small-vessel ischemic changes affect the pons. No focal cerebellar insult. Old lacunar infarctions are present within the thalami and basal ganglia. Extensive chronic small-vessel ischemic changes affect the cerebral hemispheric white matter. Previously seen acute infarction in the left parietal cortical and subcortical brain has undergone expected evolutionary changes. No evidence of true extension. There is a late subacute infarction within the right splenium of the corpus callosum that was not present in August of this year but is not acute. There is a subacute infarction of the left splenium of the corpus callosum that was not present in August of this year but is not acute. No evidence of hydrocephalus or acute hemorrhage. There is chronic hemosiderin deposition related to an old white matter infarction adjacent to the anterior body of the left lateral ventricle. 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 Vascular: Major vessels at the base of the brain show flow. Skull and upper cervical spine: Negative Sinuses/Orbits: Clear/normal Other: None IMPRESSION: No acute MRI finding. Late subacute infarction of the left parietal cortical and subcortical brain as seen previously. No evidence of true recent extension. Subacute  infarctions of the splenium of the corpus callosum, late subacute on the right and subacute on the left. These were not present in August of this year but are not acute insults. Extensive chronic small-vessel ischemic changes of the pons, thalami, basal ganglia and hemispheric white matter. Chronic blood products in the deep white matter adjacent to the anterior body of the left lateral ventricle, unchanged. Electronically Signed   By: Paulina Fusi M.D.   On: 06/17/2021 18:43    PHYSICAL EXAM Pleasant middle-aged obese  Caucasian lady not in distress. . Afebrile. Head is nontraumatic. Neck is supple without bruit.    Cardiac exam no murmur or gallop. Lungs are clear to auscultation. Distal pulses are well felt.  Neurological Exam :  She is awake alert oriented to time place and person.  Speech is clear without dysarthria or aphasia.  Extraocular movements are full range without nystagmus.  Face is symmetric without weakness.  Tongue is midline.  Motor system exam shows right hemiparesis with 3/5 right upper extremity strength proximally with secondary weakness of right grip and intrinsic hand muscles.  Orbits left over right upper extremity.  4/5 strength in both lower extremities with trace weakness of right hip flexors and ankle dorsiflexors.  Sensation is symmetric bilaterally.  Deep tendon feels are symmetric.  Gait not tested. ASSESSMENT/PLAN Ms. KALEIYAH POLSKY is a 65 y.o. female with history of hypertension, hyperlipidemia, type 2 diabetes, CKD stage IIIa, left carotid stenosis status post left transthoracic carotid artery revascularization, CVA in August 2022 who presented to the ED with generalized weakness.  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  Subacute left frontal MCA branch infarct in August 2022 secondary to left carotid stenosis s/p revascularization. CT head No acute abnormality. Small vessel disease. Atrophy. Expected evolution of previously demonstrated infarct in the posterior left frontal lobe.  MRI  Late subacute infarction of the left parietal cortical and subcortical brain as seen previously. No evidence of true recent extension.  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 These were not present in August of this year but are not acute insults. Extensive chronic small-vessel ischemic changes of the pons, thalami, basal ganglia and hemispheric white matter. Chronic blood  products in the deep white matter adjacent to the anterior body of the left lateral ventricle, unchanged. Transcranial Doppler ordered  Carotid Doppler  ordered 2D Echo ordered UDS ordered LDL 133 HgbA1c 5.7 VTE prophylaxis - Lovenox and SCD's    Diet   Diet Carb Modified Fluid consistency: Thin; Room service appropriate? Yes   aspirin 81 mg daily and clopidogrel 75 mg daily prior to admission, now on aspirin 81 mg daily and Brilinta (ticagrelor) 90 mg bid. Unsure whether she is complaint with ASA/Plavix but will change to ASA/brilinta for now Therapy recommendations:  CIR Disposition:  pending  Hypertension Home meds: amlodipine 10 mg, lisinopril 10 mg, HCTZ 12. Seems to be noncomplaint with medications UnStable Permissive hypertension (OK if < 220/120) but gradually normalize in 5-7 days Long-term BP goal normotensive  Hyperlipidemia Home meds:  atorvastatin 80mg , resumed in hospital LDL 133, goal < 70 noncompliant Continue statin at discharge  Diabetes type II Controlled Home meds:  glimepiride HgbA1c 5.7, goal < 7.0 CBGs Recent Labs    06/17/21 1336 06/18/21 0810 06/18/21 1216  GLUCAP 136* 80 132*    SSI  Other Stroke Risk Factors Advanced Age >/= 40  Hx stroke/TIA  Other Active Problems COVID 19 CKD stage 3   Hospital day # 1  Gevena Mart, NP   STROKE MD NOTE :  Patient presents with subjective worsening of right upper extremity weakness with some pain component.  MRI however shows new subacute bilateral corpus callosum infarcts which are not seen on her previous admission for left frontal infarct in August 2022 due to symptomatic left carotid stenosis for which she underwent revascularization.  Recommend change aspirin and Plavix to aspirin and Brilinta for secondary stroke prevention and aggressive risk factor modification.  Check carotid and transcranial Dopplers k and urine drug screen.    Greater than 50% time during this 35-minute visit was spent in  counseling and coordination of care and discussion with care team and answering questions. Delia Heady, MD To contact Stroke Continuity provider, please refer to [email or web address]. After hours, contact General Neurology

## 2021-06-19 ENCOUNTER — Encounter (HOSPITAL_COMMUNITY): Payer: Medicare Other

## 2021-06-19 ENCOUNTER — Other Ambulatory Visit (HOSPITAL_COMMUNITY): Payer: Medicare Other

## 2021-06-19 ENCOUNTER — Inpatient Hospital Stay (HOSPITAL_COMMUNITY): Payer: Medicare Other

## 2021-06-19 DIAGNOSIS — I6330 Cerebral infarction due to thrombosis of unspecified cerebral artery: Secondary | ICD-10-CM | POA: Insufficient documentation

## 2021-06-19 DIAGNOSIS — I6389 Other cerebral infarction: Secondary | ICD-10-CM | POA: Diagnosis not present

## 2021-06-19 DIAGNOSIS — I639 Cerebral infarction, unspecified: Secondary | ICD-10-CM | POA: Diagnosis not present

## 2021-06-19 DIAGNOSIS — R531 Weakness: Secondary | ICD-10-CM | POA: Diagnosis not present

## 2021-06-19 LAB — ECHOCARDIOGRAM COMPLETE
Area-P 1/2: 7.02 cm2
Calc EF: 34.3 %
Height: 66 in
S' Lateral: 2.7 cm
Single Plane A2C EF: 32.5 %
Single Plane A4C EF: 39.5 %
Weight: 2560.86 oz

## 2021-06-19 LAB — GLUCOSE, CAPILLARY
Glucose-Capillary: 116 mg/dL — ABNORMAL HIGH (ref 70–99)
Glucose-Capillary: 162 mg/dL — ABNORMAL HIGH (ref 70–99)
Glucose-Capillary: 150 mg/dL — ABNORMAL HIGH (ref 70–99)
Glucose-Capillary: 109 mg/dL — ABNORMAL HIGH (ref 70–99)

## 2021-06-19 LAB — BASIC METABOLIC PANEL
Anion gap: 8 (ref 5–15)
BUN: 22 mg/dL (ref 8–23)
CO2: 25 mmol/L (ref 22–32)
Calcium: 9 mg/dL (ref 8.9–10.3)
Chloride: 102 mmol/L (ref 98–111)
Creatinine, Ser: 1.22 mg/dL — ABNORMAL HIGH (ref 0.44–1.00)
GFR, Estimated: 49 mL/min — ABNORMAL LOW (ref >60)
Glucose, Bld: 134 mg/dL — ABNORMAL HIGH (ref 70–99)
Potassium: 4.1 mmol/L (ref 3.5–5.1)
Sodium: 135 mmol/L (ref 135–145)

## 2021-06-19 MED ORDER — AMLODIPINE BESYLATE 5 MG PO TABS
5.0000 mg | ORAL_TABLET | Freq: Every day | ORAL | Status: DC
  Administered 2021-06-19: 5 mg via ORAL
  Filled 2021-06-19: qty 1

## 2021-06-19 MED ORDER — ACETAMINOPHEN 650 MG RE SUPP: 650.0000 mg | Freq: Four times a day (QID) | RECTAL | Status: DC | PRN

## 2021-06-19 MED ORDER — ACETAMINOPHEN 500 MG PO TABS
1000.0000 mg | ORAL_TABLET | Freq: Four times a day (QID) | ORAL | Status: DC | PRN
  Administered 2021-06-21: 1000 mg via ORAL
  Filled 2021-06-19: qty 2

## 2021-06-19 MED ORDER — LOPERAMIDE HCL 2 MG PO CAPS
2.0000 mg | ORAL_CAPSULE | ORAL | Status: DC | PRN
  Administered 2021-06-19 – 2021-06-25 (×5): 2 mg via ORAL
  Filled 2021-06-19 (×5): qty 1

## 2021-06-19 MED ORDER — PERFLUTREN LIPID MICROSPHERE
1.0000 mL | INTRAVENOUS | Status: AC | PRN
  Administered 2021-06-19: 2 mL via INTRAVENOUS
  Filled 2021-06-19: qty 10

## 2021-06-19 NOTE — Progress Notes (Signed)
STROKE TEAM PROGRESS NOTE   INTERVAL HISTORY Nobody is at the bedside.  Patient states right upper extremity pain is improved but she still has weakness.   CT angiogram of the brain and neck shows intracranial atherosclerosis affecting bilateral P2 and MCA branch vessels and patent left carotid stent with moderate narrowing in the midportion.  2D echo is yet pending Vitals:   06/18/21 0739 06/18/21 1116 06/18/21 1133 06/18/21 1536  BP: (!) 168/79 (!) 170/90 (!) 152/92 (!) 191/96  Pulse: 66  84 94  Resp: 15 (!) 23 19 20   Temp: 98.1 F (36.7 C)  97.9 F (36.6 C) 98 F (36.7 C)  TempSrc: Oral  Oral Oral  SpO2: 98%     Weight:      Height:       CBC:  Recent Labs  Lab 06/14/21 2144 06/17/21 1305 06/18/21 0231  WBC 8.7 6.1 5.2  NEUTROABS 5.5  --   --   HGB 14.5 15.0 14.5  HCT 44.0 45.3 43.7  MCV 97.3 97.2 96.0  PLT 370 304 284   Basic Metabolic Panel:  Recent Labs  Lab 06/18/21 0231 06/19/21 0216  NA 137 135  K 3.4* 4.1  CL 102 102  CO2 24 25  GLUCOSE 95 134*  BUN 19 22  CREATININE 1.01* 1.22*  CALCIUM 9.1 9.0  MG 2.0  --    Lipid Panel:  Recent Labs  Lab 06/18/21 0231  CHOL 201*  TRIG 140  HDL 40*  CHOLHDL 5.0  VLDL 28  LDLCALC 06/20/21*   HgbA1c:  Recent Labs  Lab 06/18/21 0231  HGBA1C 5.7*   Urine Drug Screen:  Recent Labs  Lab 06/18/21 1743  LABOPIA NONE DETECTED  COCAINSCRNUR NONE DETECTED  LABBENZ NONE DETECTED  AMPHETMU NONE DETECTED  THCU NONE DETECTED  LABBARB NONE DETECTED    Alcohol Level No results for input(s): ETH in the last 168 hours.  IMAGING past 24 hours CT ANGIO HEAD NECK W WO CM  Result Date: 06/18/2021 CLINICAL DATA:  Stroke, follow-up EXAM: CT ANGIOGRAPHY HEAD AND NECK TECHNIQUE: Multidetector CT imaging of the head and neck was performed using the standard protocol during bolus administration of intravenous contrast. Multiplanar CT image reconstructions and MIPs were obtained to evaluate the vascular anatomy. Carotid  stenosis measurements (when applicable) are obtained utilizing NASCET criteria, using the distal internal carotid diameter as the denominator. CONTRAST:  57mL OMNIPAQUE IOHEXOL 350 MG/ML SOLN COMPARISON:  06/17/2021 MRI brain, 06/17/2021 CT head, 04/06/2021 CTA head and FINDINGS: CT HEAD FINDINGS Brain: No evidence of acute infarction, hemorrhage, hydrocephalus, extra-axial collection or mass lesion/mass effect. Redemonstrated right middle cranial fossa arachnoid cyst. Unchanged cerebral atrophy, with prominent ventricles and subarachnoid spaces. Periventricular white matter changes, likely the sequela of chronic small vessel ischemic disease. Posterior left frontal lobe encephalomalacia. Vascular: Please see CTA findings below. Skull: Normal. Negative for fracture or focal lesion. Sinuses: Dependent fluid in the left sphenoid sinus, unchanged. Paranasal sinuses are otherwise clear. The mastoids are well aerated. Orbits: No acute finding. Review of the MIP images confirms the above findings CTA NECK FINDINGS Aortic arch: Standard branching. Imaged portion shows no evidence of aneurysm or dissection. No significant stenosis of the major arch vessel origins. Moderate calcified plaque extending into the branch vessels, particularly the left subclavian, without hemodynamically significant stenosis. Right carotid system: Patent. Unchanged calcified plaque at the bifurcation, resulting in less than 50% ICA origin stenosis. No dissection. Left carotid system: Redemonstrated stent extending from the distal common carotid  into the proximal ICA, which appears patent. Again noted is narrowing in the midportion of the stent, which is less than 50%. Vertebral arteries: Left dominant. No evidence of dissection, stenosis (50% or greater) or occlusion. Calcification at the origin of the right vertebral artery, without evidence of significant stenosis. Skeleton: No acute osseous abnormality. Partial fusion of C3 and C4. Other neck:  Negative Upper chest: Negative Review of the MIP images confirms the above findings CTA HEAD FINDINGS Anterior circulation: Both internal carotid arteries are patent to the termini, with mild calcified plaque but without stenosis or other abnormality. A1 segments patent. Normal anterior communicating artery. Anterior cerebral arteries are patent to their distal aspects. No M1 stenosis or occlusion. Normal MCA bifurcations. Distal MCA branches demonstrate multifocal irregularity, similar to the prior exam. Grossly symmetric perfusion. Posterior circulation: Vertebral arteries widely patent to the vertebrobasilar junction without stenosis. Posterior inferior cerebral arteries patent bilaterally. Basilar patent to its distal aspect. Superior cerebral arteries patent bilaterally. Normal P1 segments bilaterally, with redemonstrated focal stenosis of the P2 segments (series 11, images 420 and 425). The posterior communicating arteries are not visualized. Venous sinuses: As permitted by contrast timing, patent. Anatomic variants: None significant Review of the MIP images confirms the above findings IMPRESSION: 1. No acute intracranial process. 2. No intracranial large vessel occlusion. Intracranial atherosclerosis affecting the bilateral P2 and bilateral MCA branch vessels. 3. Patent left carotid stent, with moderate narrowing in the midportion. No hemodynamically significant stenosis in the neck. 4.  Aortic Atherosclerosis (ICD10-I70.0). Electronically Signed   By: Wiliam Ke M.D.   On: 06/18/2021 22:11    PHYSICAL EXAM Pleasant middle-aged obese Caucasian lady not in distress. . Afebrile. Head is nontraumatic. Neck is supple without bruit.    Cardiac exam no murmur or gallop. Lungs are clear to auscultation. Distal pulses are well felt.  Neurological Exam :  She is awake alert oriented to time place and person.  Speech is clear without dysarthria or aphasia.  Extraocular movements are full range without  nystagmus.  Face is symmetric without weakness.  Tongue is midline.  Motor system exam shows right hemiparesis with 3/5 right upper extremity strength proximally with secondary weakness of right grip and intrinsic hand muscles.  Orbits left over right upper extremity.  4/5 strength in both lower extremities with trace weakness of right hip flexors and ankle dorsiflexors.  Sensation is symmetric bilaterally.  Deep tendon feels are symmetric.  Gait not tested. ASSESSMENT/PLAN Jade [name] is a 65 y.o. female with history of hypertension, hyperlipidemia, type 2 diabetes, CKD stage IIIa, left carotid stenosis status post left transthoracic carotid artery revascularization, CVA in August 2022 who presented to the ED with generalized weakness.  Subacute new bilateral corpus callosal ischemic strokes secondary to small vessels disease in the setting of acute COVID infection.  Subacute left frontal MCA branch infarct in August 2022 secondary to left carotid stenosis s/p revascularization. CT head No acute abnormality. Small vessel disease. Atrophy. Expected evolution of previously demonstrated infarct in the posterior left frontal lobe.  MRI  Late subacute infarction of the left parietal cortical and subcortical brain as seen previously. No evidence of true recent extension.  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 These were not present in August of this year but are not acute insults. Extensive chronic small-vessel ischemic changes of the pons, thalami, basal ganglia and hemispheric white matter. Chronic blood products in the deep  white matter adjacent to the anterior body of the left lateral ventricle, unchanged. CT angiogram of brain and neck show intracranial atherosclerosis affecting bilateral P2 and MCA branches.  Patent left carotid stent with moderate narrowing in the midportion.   2D Echo ordered UDS negative LDL  133 HgbA1c 5.7 VTE prophylaxis - Lovenox and SCD's    Diet   Diet Carb Modified Fluid consistency: Thin; Room service appropriate? Yes   aspirin 81 mg daily and clopidogrel 75 mg daily prior to admission, now on aspirin 81 mg daily and Brilinta (ticagrelor) 90 mg bid. Unsure whether she is complaint with ASA/Plavix but will change to ASA/brilinta for now Therapy recommendations:  CIR Disposition:  pending  Hypertension Home meds: amlodipine 10 mg, lisinopril 10 mg, HCTZ 12. Seems to be noncomplaint with medications UnStable Permissive hypertension (OK if < 220/120) but gradually normalize in 5-7 days Long-term BP goal normotensive  Hyperlipidemia Home meds:  atorvastatin 80mg , resumed in hospital LDL 133, goal < 70 noncompliant Continue statin at discharge  Diabetes type II Controlled Home meds:  glimepiride HgbA1c 5.7, goal < 7.0 CBGs Recent Labs    06/18/21 1641 06/18/21 2207 06/19/21 0844  GLUCAP 170* 193* 116*    SSI  Other Stroke Risk Factors Advanced Age >/= 11  Hx stroke/TIA  Other Active Problems COVID 19 CKD stage 3   Hospital day # 2    Patient presents with subjective worsening of right upper extremity weakness with some pain component.  MRI however shows new subacute bilateral corpus callosum infarcts which are not seen on her previous admission for left frontal infarct in August 2022 due to symptomatic left carotid stenosis for which she underwent revascularization.  Recommend change aspirin and Plavix to aspirin and Brilinta for secondary stroke prevention and aggressive risk factor modification.  Check echocardiogram results stroke team will sign off.  Kindly call for questions greater than 50% time during this 25-minute visit was spent in counseling and coordination of care and discussion with care team and answering questions. September 2022, MD To contact Stroke Continuity provider, please refer to Delia Heady. After hours, contact General Neurology

## 2021-06-19 NOTE — Plan of Care (Signed)
  Problem: Coping: Goal: Will verbalize positive feelings about self Outcome: Progressing   Problem: Health Behavior/Discharge Planning: Goal: Ability to manage health-related needs will improve Outcome: Progressing

## 2021-06-19 NOTE — Progress Notes (Signed)
Jade [name] is 65 y.o. with pertinent PMH HTN, HLD, T2DM, former smoker ,multiple CVA with recent CVA in August , and left carotid artery stenosis s/p left transcarotid artery revascularization (04/06/2021) ,who presented with weakness and admitted for subacute stroke.  Subjective:  NAEON: Patient had right arm pain that is chronic for her.  Denied any acute concerns but had concerns regarding not being able to sleep overnight because sometimes the room was hot and other time it was cold.   Objective:  Vital signs in last 24 hours: Vitals:   06/18/21 0041 06/18/21 0042 06/18/21 0423 06/18/21 0739  BP: (!) 176/75  (!) 186/82 (!) 168/79  Pulse: 70  72 66  Resp: 18  20 15   Temp: 98.8 F (37.1 C)  97.8 F (36.6 C) 98.1 F (36.7 C)  TempSrc: Oral   Oral  SpO2: 98%  96% 98%  Weight:  72.6 kg    Height:       Supplemental O2: Room Air SpO2: 98 %  Physical Exam General: NAD Head: Normocephalic, atraumatic  Neck: Neck supple with full range of motion (ROM).  Lungs: Normal work of breathing on room air. MSK: No asymmetry or muscle atrophy. Good grip on left side with minimal on right hand. 3/5 on RUE. 4/5 in RLE. 5/5 on left extremities.  Skin: warm, dry good skin turgor, no lesions Neuro: Alert and oriented. CN grossly intact. Shoulder shrug present bilaterally but weaker on the right side.  Psych: Normal mood and normal affect    CBC Latest Ref Rng & Units 06/18/2021 06/17/2021 06/14/2021  WBC 4.0 - 10.5 K/uL 5.2 6.1 8.7  Hemoglobin 12.0 - 15.0 g/dL 06/16/2021 42.7 06.2  Hematocrit 36.0 - 46.0 % 43.7 45.3 44.0  Platelets 150 - 400 K/uL 284 304 370    CMP Latest Ref Rng & Units 06/18/2021 06/17/2021 06/14/2021  Glucose 70 - 99 mg/dL 95 06/16/2021) 283(T)  BUN 8 - 23 mg/dL 19 18 20   Creatinine 0.44 - 1.00 mg/dL 517(O) ) 1.60(V  Sodium 135 - 145 mmol/L 137 136 142  Potassium 3.5 - 5.1 mmol/L 3.4(L) 3.6 3.6  Chloride 98 - 111 mmol/L 102 101 105  CO2 22 - 32 mmol/L 24 21(L) 26   Calcium 8.9 - 10.3 mg/dL 9.1 9.3 3.71(G  Total Protein 6.5 - 8.1 g/dL - - -  Total Bilirubin 0.3 - 1.2 mg/dL - - -  Alkaline Phos 38 - 126 U/L - - -  AST 15 - 41 U/L - - -  ALT 0 - 44 U/L - - -  Lipid Panel     Component Value Date/Time   CHOL 201 (H) 06/18/2021 0231   TRIG 140 06/18/2021 0231   HDL 40 (L) 06/18/2021 0231   CHOLHDL 5.0 06/18/2021 0231   VLDL 28 06/18/2021 0231   LDLCALC 133 (H) 06/18/2021 0231   LDLDIRECT 164.7 08/24/2013 0848     Filed Weights   06/17/21 1333 06/18/21 0042  Weight: 77.1 kg 72.6 kg    No intake or output data in the 24 hours ending 06/18/21 0939 Net IO Since Admission: No IO data has been entered for this period [06/18/21 0939]  Assessment/Plan: Jade [name] is 65 y.o. with pertinent PMH HTN, HLD, T2DM, former smoker ,multiple CVA with recent CVA in August , and left carotid artery stenosis s/p left transcarotid artery revascularization (04/06/2021) ,who presented with weakness and admitted for subacute stroke.   Subacute CVA Weakness Patient presented with increased weakness and  found to have subacute CVA. Most likely in the setting of non-compliance as patient states she was out of all her medications for a while and upon further clarifications stated about 2-3 weeks.  - Neuro signed off - Echo pending - Control modifiable risk factors: HTN, HLD, T2DM - ASA 81 mg daily - Brilinta 90 mg BID - continue Atorvastatin 80 mg - BP goal: <130/90 - Telemetry monitoring - PT/OT recommends SNF which require her to be cleared from COVID.   Hypokalemia (resolved) Patient's K is 4.1 on 06/19/21. Magnesium wnl.  -Replete for a goal of 4.  Covid-19  - Asymptomatic , positive on screening test.Has not received Covid Vaccine. -Continue to monitor. If patient develops hypoxia will initiate steroids.  Hypertension -Home medications amlodipine 10 mg, lisinopril 10 mg, HCTZ 12.5. Reports being out for a few weeks due to issues obtaining them.  - BP  goal as above, <130/90  - Norvasc 5 mg, HCTZ 12.5 mg -Increase if hypertension continues   Hyperlipidemia - Chronic lipid panel showing cholesterol 201, and LDL 133. - Continue Atorvastatin 80 mg   CKD Stage 3a (stable) -Baseline creatinine 1.1-1.3. Cr 1.22 on 06/19/21 which is slight increase from yesterday.  - Continue to monitor   Type 2 Diabetes Mellitus  - Hemoglobin A1c 6.5 03/2021, home medication glimepiride. HbA1c 5.7 06/18/21. CBGs <140. - SSI-S  Diet: Carb-Modified VTE: Enoxaparin IVF: None,None Code: DNR   Prior to Admission Living Arrangement: Home, living with assistance from son Anticipated Discharge Location: CIR per PT/OT recommendations Barriers to Discharge: Workup as above, placement   Dispo: Admit patient to Inpatient with expected length of stay greater than 2 midnights.  Gwenevere Abbot, MD Eligha Bridegroom. [facility] Internal Medicine Residency, PGY-1  Pager: [phone] After 5 pm and on weekends: Please call the on-call pager

## 2021-06-19 NOTE — Progress Notes (Signed)
  Echocardiogram 2D Echocardiogram has been performed.  Jade [name] 06/19/2021, 3:37 PM

## 2021-06-20 ENCOUNTER — Other Ambulatory Visit (HOSPITAL_COMMUNITY): Payer: Self-pay

## 2021-06-20 DIAGNOSIS — R531 Weakness: Secondary | ICD-10-CM | POA: Diagnosis not present

## 2021-06-20 DIAGNOSIS — I6330 Cerebral infarction due to thrombosis of unspecified cerebral artery: Secondary | ICD-10-CM | POA: Diagnosis not present

## 2021-06-20 DIAGNOSIS — I10 Essential (primary) hypertension: Secondary | ICD-10-CM | POA: Diagnosis not present

## 2021-06-20 DIAGNOSIS — I429 Cardiomyopathy, unspecified: Secondary | ICD-10-CM

## 2021-06-20 DIAGNOSIS — I639 Cerebral infarction, unspecified: Secondary | ICD-10-CM | POA: Diagnosis not present

## 2021-06-20 LAB — BASIC METABOLIC PANEL
Anion gap: 8 (ref 5–15)
BUN: 18 mg/dL (ref 8–23)
CO2: 20 mmol/L — ABNORMAL LOW (ref 22–32)
Calcium: 9 mg/dL (ref 8.9–10.3)
Chloride: 108 mmol/L (ref 98–111)
Creatinine, Ser: 0.96 mg/dL (ref 0.44–1.00)
GFR, Estimated: >60 mL/min (ref >60)
Glucose, Bld: 134 mg/dL — ABNORMAL HIGH (ref 70–99)
Potassium: 4.4 mmol/L (ref 3.5–5.1)
Sodium: 136 mmol/L (ref 135–145)

## 2021-06-20 LAB — GLUCOSE, CAPILLARY
Glucose-Capillary: 140 mg/dL — ABNORMAL HIGH (ref 70–99)
Glucose-Capillary: 170 mg/dL — ABNORMAL HIGH (ref 70–99)
Glucose-Capillary: 127 mg/dL — ABNORMAL HIGH (ref 70–99)
Glucose-Capillary: 195 mg/dL — ABNORMAL HIGH (ref 70–99)

## 2021-06-20 LAB — HEPARIN LEVEL (UNFRACTIONATED): Heparin Unfractionated: >1.1 IU/mL — ABNORMAL HIGH (ref 0.30–0.70)

## 2021-06-20 MED ORDER — HEPARIN (PORCINE) 25000 UT/250ML-% IV SOLN
1000.0000 [IU]/h | INTRAVENOUS | Status: DC
  Administered 2021-06-20: 1000 [IU]/h via INTRAVENOUS
  Filled 2021-06-20: qty 250

## 2021-06-20 MED ORDER — LOSARTAN POTASSIUM 25 MG PO TABS
25.0000 mg | ORAL_TABLET | Freq: Every day | ORAL | Status: DC
  Administered 2021-06-20 – 2021-06-21 (×2): 25 mg via ORAL
  Filled 2021-06-20 (×2): qty 1

## 2021-06-20 MED ORDER — CARVEDILOL 3.125 MG PO TABS
3.1250 mg | ORAL_TABLET | Freq: Two times a day (BID) | ORAL | Status: DC
  Filled 2021-06-20: qty 1

## 2021-06-20 MED ORDER — HEPARIN (PORCINE) 25000 UT/250ML-% IV SOLN: 800.0000 [IU]/h | INTRAVENOUS | Status: DC

## 2021-06-20 MED ORDER — HEPARIN BOLUS VIA INFUSION
3000.0000 [IU] | Freq: Once | INTRAVENOUS | Status: DC
  Filled 2021-06-20: qty 3000

## 2021-06-20 MED ORDER — AMLODIPINE BESYLATE 10 MG PO TABS
10.0000 mg | ORAL_TABLET | Freq: Every day | ORAL | Status: DC
  Administered 2021-06-20: 10 mg via ORAL
  Filled 2021-06-20: qty 1

## 2021-06-20 MED ORDER — TICAGRELOR 90 MG PO TABS
90.0000 mg | ORAL_TABLET | Freq: Two times a day (BID) | ORAL | Status: DC
  Administered 2021-06-21 – 2021-06-30 (×20): 90 mg via ORAL
  Filled 2021-06-20 (×20): qty 1

## 2021-06-20 MED ORDER — ENOXAPARIN SODIUM 40 MG/0.4ML IJ SOSY
40.0000 mg | PREFILLED_SYRINGE | INTRAMUSCULAR | Status: DC
  Administered 2021-06-21 – 2021-06-30 (×10): 40 mg via SUBCUTANEOUS
  Filled 2021-06-20 (×10): qty 0.4

## 2021-06-20 MED ORDER — HEPARIN BOLUS VIA INFUSION
2000.0000 [IU] | Freq: Once | INTRAVENOUS | Status: AC
  Administered 2021-06-20: 2000 [IU] via INTRAVENOUS
  Filled 2021-06-20: qty 2000

## 2021-06-20 MED ORDER — HYDROCHLOROTHIAZIDE 25 MG PO TABS
25.0000 mg | ORAL_TABLET | Freq: Every day | ORAL | Status: DC
  Administered 2021-06-20 – 2021-06-21 (×2): 25 mg via ORAL
  Filled 2021-06-20 (×2): qty 1

## 2021-06-20 MED ORDER — HYDRALAZINE HCL 25 MG PO TABS
25.0000 mg | ORAL_TABLET | Freq: Once | ORAL | Status: AC
  Administered 2021-06-20: 25 mg via ORAL
  Filled 2021-06-20: qty 1

## 2021-06-20 MED ORDER — CARVEDILOL 3.125 MG PO TABS
3.1250 mg | ORAL_TABLET | Freq: Two times a day (BID) | ORAL | Status: DC
  Administered 2021-06-20 – 2021-06-21 (×2): 3.125 mg via ORAL
  Filled 2021-06-20 (×2): qty 1

## 2021-06-20 MED ORDER — ASPIRIN EC 81 MG PO TBEC
81.0000 mg | DELAYED_RELEASE_TABLET | Freq: Every day | ORAL | Status: DC
  Administered 2021-06-21 – 2021-06-30 (×10): 81 mg via ORAL
  Filled 2021-06-20 (×10): qty 1

## 2021-06-20 MED ORDER — ASPIRIN 81 MG PO CHEW
81.0000 mg | CHEWABLE_TABLET | Freq: Every day | ORAL | Status: DC
  Administered 2021-06-20: 81 mg via ORAL
  Filled 2021-06-20: qty 1

## 2021-06-20 NOTE — Progress Notes (Signed)
ANTICOAGULATION CONSULT NOTE  Pharmacy Consult for Heparin Indication:  LV Thrombus  Allergies  Allergen Reactions   Baclofen Other (See Comments)    Pt said felt weaker and had slurred speech   Nickel Dermatitis and Rash    Patient Measurements: Height: 5\' 6"  (167.6 cm) Weight: 72.6 kg (160 lb 0.9 oz) IBW/kg (Calculated) : 59.3  Vital Signs: Temp: 98.1 F (36.7 C) (10/25 2032) Temp Source: Oral (10/25 2032) BP: 203/91 (10/25 1700) Pulse Rate: 103 (10/25 2032)  Labs: Recent Labs    06/18/21 0231 06/19/21 0216 06/20/21 0114 06/20/21 2001  HGB 14.5  --   --   --   HCT 43.7  --   --   --   PLT 284  --   --   --   HEPARINUNFRC  --   --   --  >1.10*  CREATININE 1.01* 1.22* 0.96  --      Estimated Creatinine Clearance: 58.8 mL/min (by C-G formula based on SCr of 0.96 mg/dL).  Assessment: Jade [name] is 65 y.o. with pertinent PMH HTN, HLD, T2DM, former smoker, multiple CVA with recent CVA in August , and left carotid artery stenosis s/p left transcarotid artery revascularization (04/06/2021) ,who presented with weakness and admitted for subacute stroke.  Found to have a new LV thrombus and starting heparin.  Heparin level >1.1 (1000 units/hr) Lab drew level from opposite arm that heparin is infusing No sign/symptoms of bleed  Goal of Therapy:  Heparin level 0.3-0.5 Monitor platelets by anticoagulation protocol: Yes   Plan:  Hold heparin x2 hours Resume heparin drip @800  units/hr Heparin level with AM labs Daily heparin level and CBC  Thank you for allowing pharmacy to be a part of this patient's care.  06/06/2021, PharmD Clinical Pharmacist  Please check AMION for all [facility] Pharmacy numbers After 10:00 PM, call Main Pharmacy [phone]

## 2021-06-20 NOTE — Progress Notes (Signed)
   06/20/21 1628  Vitals  Temp 97.7 F (36.5 C)  Temp Source Axillary  BP (!) 210/92  MAP (mmHg) 130  BP Location Right Arm  BP Method Automatic  Patient Position (if appropriate) Lying  Pulse Rate Source Monitor  ECG Heart Rate 89  Resp 14  MEWS COLOR  MEWS Score Color Yellow  Oxygen Therapy  SpO2 99 %  O2 Device Room Air  MEWS Score  MEWS Temp 0  MEWS Systolic 2  MEWS Pulse 0  MEWS RR 0  MEWS LOC 0  MEWS Score 2

## 2021-06-20 NOTE — Progress Notes (Signed)
Pt afternoon cbg is 170

## 2021-06-20 NOTE — Progress Notes (Addendum)
Jade [name] is 65 y.o. with pertinent PMH HTN, HLD, T2DM, former smoker ,multiple CVA with recent CVA in August , and left carotid artery stenosis s/p left transcarotid artery revascularization (04/06/2021) ,who presented with weakness and admitted for subacute stroke.  Subjective:  NAEON  Denied any acute concerns. Stated she did not sleep well due to constant beeping. She denied any chest pain or shortness of breath. She stated she had multiple episodes of diarrhea and this is chronic for her. At home she takes imodium and that helps her.  PT was about to work with the patient as we were evaluating her.   Objective:  Vital signs in last 24 hours: Vitals:   06/18/21 0041 06/18/21 0042 06/18/21 0423 06/18/21 0739  BP: (!) 176/75  (!) 186/82 (!) 168/79  Pulse: 70  72 66  Resp: 18  20 15   Temp: 98.8 F (37.1 C)  97.8 F (36.6 C) 98.1 F (36.7 C)  TempSrc: Oral   Oral  SpO2: 98%  96% 98%  Weight:  72.6 kg    Height:       Supplemental O2: Room Air SpO2: 98 %  Physical Exam General: NAD  Neck: Neck supple with full range of motion (ROM).  Lungs: CTAB, no wheeze, rhonchi or rales.  Cardiovascular: Regular rate, regular rhythm Neuro: Alert and oriented. CN grossly intact Psych: Normal mood and normal affect    CBC Latest Ref Rng & Units 06/18/2021 06/17/2021 06/14/2021  WBC 4.0 - 10.5 K/uL 5.2 6.1 8.7  Hemoglobin 12.0 - 15.0 g/dL 06/16/2021 67.3 41.9  Hematocrit 36.0 - 46.0 % 43.7 45.3 44.0  Platelets 150 - 400 K/uL 284 304 370    CMP Latest Ref Rng & Units 06/18/2021 06/17/2021 06/14/2021  Glucose 70 - 99 mg/dL 95 06/16/2021) 024(O)  BUN 8 - 23 mg/dL 19 18 20   Creatinine 0.44 - 1.00 mg/dL 973(Z) ) 3.29(J  Sodium 135 - 145 mmol/L 137 136 142  Potassium 3.5 - 5.1 mmol/L 3.4(L) 3.6 3.6  Chloride 98 - 111 mmol/L 102 101 105  CO2 22 - 32 mmol/L 24 21(L) 26  Calcium 8.9 - 10.3 mg/dL 9.1 9.3 2.42(A  Total Protein 6.5 - 8.1 g/dL - - -  Total Bilirubin 0.3 - 1.2 mg/dL - - -   Alkaline Phos 38 - 126 U/L - - -  AST 15 - 41 U/L - - -  ALT 0 - 44 U/L - - -  Lipid Panel     Component Value Date/Time   CHOL 201 (H) 06/18/2021 0231   TRIG 140 06/18/2021 0231   HDL 40 (L) 06/18/2021 0231   CHOLHDL 5.0 06/18/2021 0231   VLDL 28 06/18/2021 0231   LDLCALC 133 (H) 06/18/2021 0231   LDLDIRECT 164.7 08/24/2013 0848     Filed Weights   06/17/21 1333 06/18/21 0042  Weight: 77.1 kg 72.6 kg    No intake or output data in the 24 hours ending 06/18/21 0939 Net IO Since Admission: No IO data has been entered for this period [06/18/21 0939]  Assessment/Plan: Jade [name] is 65 y.o. with pertinent PMH HTN, HLD, T2DM, former smoker ,multiple CVA with recent CVA in August , and left carotid artery stenosis s/p left transcarotid artery revascularization (04/06/2021) ,who presented with weakness and admitted for subacute stroke.   LV Thrombus Subacute CVA Weakness Patient presented with increased weakness and found to have subacute CVA. Most likely in the setting of non-compliance as patient states she was out  of all her medications for a while and upon further clarifications stated about 2-3 weeks. Echo showed the above findings with decreased EF and a LV thrombus.  - Cardiology consulted - Neurology re-consulted since new Echo finding.  - IV heparin per pharmacy. Will transition to oral AC later.  - Control modifiable risk factors: HTN, HLD, T2DM - ASA 81 mg daily - Discontinued Brilinta 90 mg BID in the setting of heparin use per neurology - Continue Atorvastatin 80 mg - BP goal: <130/90 - Telemetry monitoring - PT/OT recommends inpatient rehab which require her to be cleared from COVID.   Hypokalemia (resolved) Patient's K is 4.4 on 06/20/21. Magnesium wnl.  -Replete for a goal of 4.  Covid-19  - Asymptomatic , positive on screening test.Has not received Covid Vaccine.  -Continue to monitor. If patient develops hypoxia will consider initiation of  steroids.  Hypertension -Home medications amlodipine 10 mg, lisinopril 10 mg, HCTZ 12.5. Reports being out for a few weeks due to issues obtaining them. Patient declines lisinopril.  - BP goal as above, <130/90  - Norvasc increased to 10 mg, HCTZ 25 mg   Hyperlipidemia - Chronic lipid panel showing cholesterol 201, and LDL 133. - Continue Atorvastatin 80 mg   CKD Stage 3a (stable) -Baseline creatinine 1.1-1.3. Creatine improved to 0.96 from 1.22 on 06/20/21. - Continue to monitor   Type 2 Diabetes Mellitus  - Hemoglobin A1c 6.5 03/2021, home medication glimepiride. HbA1c 5.7 06/18/21. CBGs <140. - SSI-S  Diarrhea Chronic. States bowel movement soon after eating.  -Will continue imodium daily. Recommend OP workup.  -Continue to monitor.   Diet: Carb-Modified VTE: Enoxaparin IVF: None,None Code: DNR   Prior to Admission Living Arrangement: Home, living with assistance from son Anticipated Discharge Location: CIR per PT/OT recommendations Barriers to Discharge: Workup as above, placement   Dispo: Admit patient to Inpatient with expected length of stay greater than 2 midnights.  Gwenevere Abbot, MD Eligha Bridegroom. [facility] Internal Medicine Residency, PGY-1  Pager: [phone] After 5 pm and on weekends: Please call the on-call pager

## 2021-06-20 NOTE — Consult Note (Signed)
Cardiology Consultation:   Patient ID: AIREL MAGADAN MRN: [id number]; DOB: February 27, 1956  Admit date: 06/17/2021 Date of Consult: 06/20/2021  PCP:  Nathaneil Canary, PA-C   [facility] Providers Cardiologist:  Thurmon Fair, MD  new      Patient Profile:   Jade [name] is a 65 y.o. female with a hx of HTN, HLD, DM2, former smoker, COPD, multiple CVA with recent CVA in Aug 2022, carotid artery stenosis, and noncompliance who is being seen 06/20/2021 for the evaluation of reduced EF and LV mass at the request of Dr. Sol Blazing.  History of Present Illness:   Jade [name] was seen by Dr. Donnie Aho in 2016 for chest pain in the setting of an abnormal nuclear stress test: "she had a Cardiolite done last week and walk only 5 minutes and 30 seconds. She had 1 mm of lateral ST depression and had what looked like an inferior reversible defect." She declined cardiac catheterization at that time and was not seen in follow up.   She has a history of right sided weakness following CVA and has gait instability and frequent falls. She has been working with PT. He has frequent ED visits with uncontrolled hypertension and needing medication refills. She was seen 02/13/21 by PCP and noted pt was concerned about MS after seeing media. MRI reviewed was not consistent with MS. She was seen in the ER 02/17/21 with hypertension. She takes multiple supplments but does not like taking medications. She refuses to take statins. She has multiple drug intolerances. HCTZ was added to her new losartan at that visit. She was hospitalized 03/05/21 with hypertensive emergency, presenting with blurry vision. Due to bilateral vertical gaze palsy, MRI brain obtained and showed acute let thalamic, anterior left frontal and possible right frontal and multiple lacunar infarcts. SBP on arrival was 260. CTA with 75% left proximal ICA stenosis. Echo with normal LVEF and grade 1 DD. A1c 6.4%, LDL was 184 at that time. She was placed on DAPT  x 3 weeks. She was scheduled for CEA in 2019 but she canceled procedure. VVS follow up was recommended.  She was hospitalized again 04/01/21 with acute left posterior MCA branch CVA felt secondary to severe left ICA stenosis. VVS consulted and completed TCAR on 04/06/21. Pt was discharged to CIR and subsequently discharged home on 05/12/21.   She presented back to the ER 06/14/21 for medication refills. She reported medication compliance with the exception of lisinopril because of side effects she heard about, but had not actually experienced any. She was discharged on amlodipine, lipitor, and HCTZ.   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 She was also found to have COVID-19 infection. She requests readmission to CIR. Echo this admission with LVEF now 25-30% and grade 3 DD, and early fibrinous thrombus formation.     Past Medical History:  Diagnosis Date   Arthritis    "back, knees, some in my left hip" (03/21/2018)   Chronic kidney disease    "was told I had 25% kidney function in early 2012" (03/21/2018)   COPD (chronic obstructive pulmonary disease) (HCC)    CVA (cerebral vascular accident) (HCC) 03/21/2018   "numb all over; head to toe" (03/21/2018)   Fibromyalgia    History of gout    Hyperlipidemia    Hypertension    Migraine    "used to get them monthly  during menopause" (03/21/2018)   Neuromuscular disorder (HCC)    OSA (obstructive sleep apnea)    "don't tolerate the machine" (03/21/2018)   Pneumonia ~ 2007   Type 2 diabetes, diet controlled (HCC)     Past Surgical History:  Procedure Laterality Date   HERNIA REPAIR     LAPAROSCOPIC CHOLECYSTECTOMY  06/2007   "no UHR w/this" (03/21/2018)   TONSILLECTOMY     TRANSCAROTID ARTERY REVASCULARIZATION  Left 04/06/2021   Procedure: LEFT TRANSCAROTID ARTERY REVASCULARIZATION;  Surgeon: Leonie Douglas, MD;   Location: [facility] OR;  Service: Vascular;  Laterality: Left;   ULTRASOUND GUIDANCE FOR VASCULAR ACCESS Right 04/06/2021   Procedure: ULTRASOUND GUIDANCE FOR VASCULAR ACCESS;  Surgeon: Leonie Douglas, MD;  Location: [facility] OR;  Service: Vascular;  Laterality: Right;   UMBILICAL HERNIA REPAIR  2009     Home Medications:  Prior to Admission medications   Medication Sig Start Date End Date Taking? Authorizing Provider  amLODipine (NORVASC) 10 MG tablet Take 1 tablet (10 mg total) by mouth daily. 06/14/21 07/14/21 Yes Milagros Loll, MD  aspirin EC 81 MG tablet Take 81 mg by mouth every morning. Swallow whole.   Yes [provider]  atorvastatin (LIPITOR) 80 MG tablet Take 1 tablet (80 mg total) by mouth daily. 06/14/21 07/14/21 Yes Dykstra, Quitman Livings, MD  clopidogrel (PLAVIX) 75 MG tablet Take 75 mg by mouth every morning.   Yes [provider]  dantrolene (DANTRIUM) 50 MG capsule Take 1 capsule (50 mg total) by mouth at bedtime. 05/11/21  Yes Angiulli, Mcarthur Rossetti, PA-C  diclofenac Sodium (VOLTAREN) 1 % GEL Apply 2 g topically 4 (four) times daily. Patient taking differently: Apply 2 g topically daily as needed (pain). 05/11/21  Yes Angiulli, Mcarthur Rossetti, PA-C  hydrochlorothiazide (HYDRODIURIL) 12.5 MG tablet Take 1 tablet (12.5 mg total) by mouth daily. 06/14/21  Yes Milagros Loll, MD  loperamide (IMODIUM A-D) 2 MG tablet Take 2-4 mg by mouth daily as needed for diarrhea or loose stools.   Yes [provider]  magnesium oxide (MAG-OX) 400 MG tablet Take 2 tablets (800 mg total) by mouth at bedtime. Patient taking differently: Take 400 mg by mouth at bedtime. 05/11/21  Yes Angiulli, Mcarthur Rossetti, PA-C  tamsulosin (FLOMAX) 0.4 MG CAPS capsule Take 1 capsule (0.4 mg total) by mouth daily after supper. 05/11/21  Yes Angiulli, Mcarthur Rossetti, PA-C  acetaminophen (TYLENOL) 325 MG tablet Take 2 tablets (650 mg total) by mouth every 6 (six) hours as needed for mild pain or headache (or temp >  37.5 C (99.5 F)). 04/13/21   Pokhrel, Rebekah Chesterfield, MD  aspirin EC 325 MG EC tablet Take 1 tablet (325 mg total) by mouth daily. Patient not taking: No sig reported 04/14/21 07/13/21  Pokhrel, Rebekah Chesterfield, MD  B Complex Vitamins (VITAMIN B COMPLEX) TABS Take 1 tabet by mouth once daily. Patient not taking: No sig reported 05/11/21   Angiulli, Mcarthur Rossetti, PA-C  Cholecalciferol 125 MCG (5000 UT) TABS Take 1 tablet (5,000 Units total) by mouth daily. Patient not taking: No sig reported 05/11/21   Angiulli, Mcarthur Rossetti, PA-C  docusate sodium (COLACE) 100 MG capsule Take 1 capsule (100 mg total) by mouth 2 (two) times daily. Patient not taking: No sig reported 04/13/21   Pokhrel, Rebekah Chesterfield, MD  glimepiride (AMARYL) 1 MG tablet Take 1 tablet (1 mg total) by mouth daily with breakfast. Patient not taking: No sig reported 05/11/21   Angiulli, Mcarthur Rossetti, PA-C  lisinopril (ZESTRIL) 10  MG tablet Take 1 tablet (10 mg total) by mouth daily. Patient not taking: No sig reported 05/11/21   Angiulli, Mcarthur Rossetti, PA-C  magnesium oxide (MAG-OX) 400 (240 Mg) MG tablet Take 1 tablet (400 mg total) by mouth daily. Patient not taking: Reported on 06/17/2021 05/13/21   Angiulli, Mcarthur Rossetti, PA-C  pantoprazole (PROTONIX) 40 MG tablet Take 1 tablet (40 mg total) by mouth daily. Patient not taking: No sig reported 05/11/21   Angiulli, Mcarthur Rossetti, PA-C  polyethylene glycol (MIRALAX / GLYCOLAX) 17 g packet Take 17 g by mouth daily. Patient not taking: No sig reported 04/14/21   Pokhrel, Rebekah Chesterfield, MD  tiZANidine (ZANAFLEX) 2 MG tablet Take 3 tablets (6 mg total) by mouth daily. Patient not taking: No sig reported 05/11/21   Angiulli, Mcarthur Rossetti, PA-C    Inpatient Medications: Scheduled Meds:  amLODipine  10 mg Oral Daily   atorvastatin  80 mg Oral Daily   dantrolene  50 mg Oral QHS   hydrochlorothiazide  25 mg Oral Daily   insulin aspart  0-5 Units Subcutaneous QHS   insulin aspart  0-9 Units Subcutaneous TID WC   potassium chloride  40 mEq Oral BID    tamsulosin  0.4 mg Oral QPC supper   Continuous Infusions:  heparin 1,000 Units/hr (06/20/21 1120)   PRN Meds: acetaminophen **OR** acetaminophen, loperamide, ondansetron **OR** ondansetron (ZOFRAN) IV, senna-docusate  Allergies:    Allergies  Allergen Reactions   Baclofen Other (See Comments)    Pt said felt weaker and had slurred speech   Nickel Dermatitis and Rash    Social History:   Social History   Socioeconomic History   Marital status: Married    Spouse name: Jose   Number of children: 1   Years of education: Not on file   Highest education level: Not on file  Occupational History   Occupation: Disability  Tobacco Use   Smoking status: Former    Packs/day: 1.25    Years: 16.00    Pack years: 20.00    Types: Cigarettes    Quit date: 08/28/1987    Years since quitting: 33.8   Smokeless tobacco: Never  Vaping Use   Vaping Use: Never used  Substance and Sexual Activity   Alcohol use: Not Currently   Drug use: Not Currently    Types: Marijuana   Sexual activity: Not Currently    Birth control/protection: Post-menopausal  Other Topics Concern   Not on file  Social History Narrative   Not on file   Social Determinants of Health   Financial Resource Strain: Not on file  Food Insecurity: Not on file  Transportation Needs: Not on file  Physical Activity: Not on file  Stress: Not on file  Social Connections: Not on file  Intimate Partner Violence: Not on file    Family History:    Family History  Problem Relation Age of Onset   Heart disease Father    Leukemia Father    Alcohol abuse Son    Other Mother    Cancer Neg Hx    Diabetes Neg Hx    Hearing loss Neg Hx    Hyperlipidemia Neg Hx    Hypertension Neg Hx    Kidney disease Neg Hx    Stroke Neg Hx      ROS:  Please see the history of present illness.   All other ROS reviewed and negative.     Physical Exam/Data:   Vitals:   06/20/21 0000 06/20/21 7741 06/20/21  4098 06/20/21 1150  BP:  (!) 187/101 (!) 153/77  (!) 151/84  Pulse:   94   Resp: Temp:  97.9 F (36.6 C)  (!) 97.5 F (36.4 C)  TempSrc:  Oral  Oral  SpO2:  95% 98% 96%  Weight:      Height:       No intake or output data in the 24 hours ending 06/20/21 1626 Last 3 Weights 06/18/2021 06/17/2021 06/14/2021  Weight (lbs) 160 lb 0.9 oz 170 lb 167 lb 15.9 oz  Weight (kg) 72.6 kg 77.111 kg 76.2 kg     Body mass index is 25.83 kg/m.  General:  Well nourished, well developed, in no acute distress HEENT: normal Neck: no JVD Vascular: No carotid bruits; Distal pulses 2+ bilaterally Cardiac:  normal S1, S2; RRR; no murmur  Lungs:  clear to auscultation bilaterally, no wheezing, rhonchi or rales  Abd: soft, nontender, no hepatomegaly  Ext: no edema Musculoskeletal: Contractures at the level of the right hand  skin: warm and dry  Neuro:  CNs 2-12 intact, 3/5 right upper extremity paresis, 4/5 weakness in both lower extremities, slightly worse on left Psych: Depressed affect   EKG:  The EKG was personally reviewed and demonstrates:  sinus rhythm with HR 90 LAFB Telemetry:  Telemetry was personally reviewed and demonstrates: Sinus rhythm  Relevant CV Studies:  Echo 06/19/21:  1. Definity contrast shows early fibrinous thrombus formation in the  apex.      . Left ventricular ejection fraction, by estimation, is 25 to 30%. The  left ventricle has severely decreased function. The left ventricle  demonstrates regional wall motion abnormalities (see scoring  diagram/findings for description). The left  ventricular internal cavity size was mildly dilated. Left ventricular  diastolic parameters are consistent with Grade III diastolic dysfunction  (restrictive). There is severe akinesis of the left ventricular, entire  anteroseptal wall and apical segment.   2. Right ventricular systolic function is normal. The right ventricular  size is normal.   3. The mitral valve is grossly normal. No evidence of  mitral valve  regurgitation.   4. The aortic valve is grossly normal. Aortic valve regurgitation is not  visualized.    Echo 03/06/21:  1. Difficult study due to poor definition of the LV endocardial border.  Recommend definity contrast for future studies. Left ventricular ejection  fraction, by estimation, is 55 to 60%. The left ventricle has normal  function. Left ventricular endocardial   border not optimally defined to evaluate regional wall motion, but  appears grossly normal. There is mild concentric left ventricular  hypertrophy. Left ventricular diastolic parameters are consistent with  Grade I diastolic dysfunction (impaired  relaxation).   2. Right ventricular systolic function is normal. The right ventricular  size is normal. Tricuspid regurgitation signal is inadequate for assessing  PA pressure.   3. The mitral valve is normal in structure. No evidence of mitral valve  regurgitation. No evidence of mitral stenosis.   4. The aortic valve is tricuspid. Aortic valve regurgitation is not  visualized. No aortic stenosis is present.   5. The inferior vena cava is normal in size with greater than 50%  respiratory variability, suggesting right atrial pressure of 3 mmHg.    Laboratory Data:  High Sensitivity Troponin:  No results for input(s): TROPONINIHS in the last 720 hours.   Chemistry Recent Labs  Lab 06/18/21 0231 06/19/21 0216 06/20/21 0114  NA 137 135 136  K 3.4*  4.1 4.4  CL 102 102 108  CO2 24 25 20*  GLUCOSE 95 134* 134*  BUN 19 22 18   CREATININE 1.01* 1.22* 0.96  CALCIUM 9.1 9.0 9.0  MG 2.0  --   --   GFRNONAA >60 49* >60  ANIONGAP 11 8 8     No results for input(s): PROT, ALBUMIN, AST, ALT, ALKPHOS, BILITOT in the last 168 hours. Lipids  Recent Labs  Lab 06/18/21 0231  CHOL 201*  TRIG 140  HDL 40*  LDLCALC 133*  CHOLHDL 5.0    Hematology Recent Labs  Lab 06/14/21 2144 06/17/21 1305 06/18/21 0231  WBC 8.7 6.1 5.2  RBC 4.52 4.66 4.55  HGB  14.5 15.0 14.5  HCT 44.0 45.3 43.7  MCV 97.3 97.2 96.0  MCH 32.1 32.2 31.9  MCHC 33.0 33.1 33.2  RDW 13.1 13.2 13.2  PLT 370 304 284   Thyroid  Recent Labs  Lab 06/18/21 0231  TSH 1.909    BNPNo results for input(s): BNP, PROBNP in the last 168 hours.  DDimer No results for input(s): DDIMER in the last 168 hours.   Radiology/Studies:  CT ANGIO HEAD NECK W WO CM  Result Date: 06/18/2021 CLINICAL DATA:  Stroke, follow-up EXAM: CT ANGIOGRAPHY HEAD AND NECK TECHNIQUE: Multidetector CT imaging of the head and neck was performed using the standard protocol during bolus administration of intravenous contrast. Multiplanar CT image reconstructions and MIPs were obtained to evaluate the vascular anatomy. Carotid stenosis measurements (when applicable) are obtained utilizing NASCET criteria, using the distal internal carotid diameter as the denominator. CONTRAST:  11mL OMNIPAQUE IOHEXOL 350 MG/ML SOLN COMPARISON:  06/17/2021 MRI brain, 06/17/2021 CT head, 04/06/2021 CTA head and FINDINGS: CT HEAD FINDINGS Brain: No evidence of acute infarction, hemorrhage, hydrocephalus, extra-axial collection or mass lesion/mass effect. Redemonstrated right middle cranial fossa arachnoid cyst. Unchanged cerebral atrophy, with prominent ventricles and subarachnoid spaces. Periventricular white matter changes, likely the sequela of chronic small vessel ischemic disease. Posterior left frontal lobe encephalomalacia. Vascular: Please see CTA findings below. Skull: Normal. Negative for fracture or focal lesion. Sinuses: Dependent fluid in the left sphenoid sinus, unchanged. Paranasal sinuses are otherwise clear. The mastoids are well aerated. Orbits: No acute finding. Review of the MIP images confirms the above findings CTA NECK FINDINGS Aortic arch: Standard branching. Imaged portion shows no evidence of aneurysm or dissection. No significant stenosis of the major arch vessel origins. Moderate calcified plaque extending into  the branch vessels, particularly the left subclavian, without hemodynamically significant stenosis. Right carotid system: Patent. Unchanged calcified plaque at the bifurcation, resulting in less than 50% ICA origin stenosis. No dissection. Left carotid system: Redemonstrated stent extending from the distal common carotid into the proximal ICA, which appears patent. Again noted is narrowing in the midportion of the stent, which is less than 50%. Vertebral arteries: Left dominant. No evidence of dissection, stenosis (50% or greater) or occlusion. Calcification at the origin of the right vertebral artery, without evidence of significant stenosis. Skeleton: No acute osseous abnormality. Partial fusion of C3 and C4. Other neck: Negative Upper chest: Negative Review of the MIP images confirms the above findings CTA HEAD FINDINGS Anterior circulation: Both internal carotid arteries are patent to the termini, with mild calcified plaque but without stenosis or other abnormality. A1 segments patent. Normal anterior communicating artery. Anterior cerebral arteries are patent to their distal aspects. No M1 stenosis or occlusion. Normal MCA bifurcations. Distal MCA branches demonstrate multifocal irregularity, similar to the prior exam. Grossly symmetric perfusion. Posterior  circulation: Vertebral arteries widely patent to the vertebrobasilar junction without stenosis. Posterior inferior cerebral arteries patent bilaterally. Basilar patent to its distal aspect. Superior cerebral arteries patent bilaterally. Normal P1 segments bilaterally, with redemonstrated focal stenosis of the P2 segments (series 11, images 420 and 425). The posterior communicating arteries are not visualized. Venous sinuses: As permitted by contrast timing, patent. Anatomic variants: None significant Review of the MIP images confirms the above findings IMPRESSION: 1. No acute intracranial process. 2. No intracranial large vessel occlusion. Intracranial  atherosclerosis affecting the bilateral P2 and bilateral MCA branch vessels. 3. Patent left carotid stent, with moderate narrowing in the midportion. No hemodynamically significant stenosis in the neck. 4.  Aortic Atherosclerosis (ICD10-I70.0). Electronically Signed   By: Wiliam Ke M.D.   On: 06/18/2021 22:11   CT Head Wo Contrast  Result Date: 06/17/2021 CLINICAL DATA:  Neuro deficit, acute stroke suspected. Weakness for 1 week. History of stroke with right-sided baseline deficits. EXAM: CT HEAD WITHOUT CONTRAST TECHNIQUE: Contiguous axial images were obtained from the base of the skull through the vertex without intravenous contrast. COMPARISON:  CT head and CT angiogram 04/06/2021. FINDINGS: Brain: There is no evidence of acute intracranial hemorrhage, mass lesion, brain edema or new extra-axial fluid collection. 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 Stable atrophy with prominence of the ventricles and subarachnoid spaces. There are chronic small vessel ischemic changes in the periventricular white matter. The previously demonstrated infarct in the posterior left frontal lobe demonstrates expected evolution with associated encephalomalacia. No stroke extension identified. Scattered low-density within the basal ganglia and thalami bilaterally appears unchanged. Vascular: Intracranial vascular calcifications. No hyperdense vessel identified. Skull: Negative for fracture or focal lesion. Sinuses/Orbits: Chronic dependent opacity in the left division of the sphenoid sinus, unchanged. The visualized paranasal sinuses, mastoid air cells and middle ears are otherwise clear. Other: Focal soft tissue protuberance in the midline frontal scalp appears unchanged. IMPRESSION: 1. No acute intracranial findings. 2. Expected evolution of previously demonstrated infarct in the posterior left frontal lobe. Chronic atrophy and small vessel ischemic changes. Electronically Signed    By: [name] Bullocks M.D.   On: 06/17/2021 15:00   MR BRAIN WO CONTRAST  Result Date: 06/17/2021 CLINICAL DATA:  Neuro deficit, acute, stroke suspected.  Weakness. EXAM: MRI HEAD WITHOUT CONTRAST TECHNIQUE: Multiplanar, multiecho pulse sequences of the brain and surrounding structures were obtained without intravenous contrast. COMPARISON:  CT earlier same day.  MRI 04/06/2021. FINDINGS: Brain: Extensive chronic small-vessel ischemic changes affect the pons. No focal cerebellar insult. Old lacunar infarctions are present within the thalami and basal ganglia. Extensive chronic small-vessel ischemic changes affect the cerebral hemispheric white matter. Previously seen acute infarction in the left parietal cortical and subcortical brain has undergone expected evolutionary changes. No evidence of true extension. There is a late subacute infarction within the right splenium of the corpus callosum that was not present in August of this year but is not acute. There is a subacute infarction of the left splenium of the corpus callosum that was not present in August of this year but is not acute. No evidence of hydrocephalus or acute hemorrhage. There is chronic hemosiderin deposition related to an old white matter infarction adjacent to the anterior body of the left lateral ventricle. 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 Vascular: Major vessels at the base of the brain show flow. Skull and upper cervical spine: Negative Sinuses/Orbits: Clear/normal Other: None IMPRESSION:  No acute MRI finding. Late subacute infarction of the left parietal cortical and subcortical brain as seen previously. No evidence of true recent extension. 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 These were not present in August of this year but are not acute insults. Extensive chronic small-vessel ischemic changes of the pons,  thalami, basal ganglia and hemispheric white matter. Chronic blood products in the deep white matter adjacent to the anterior body of the left lateral ventricle, unchanged. Electronically Signed   By: Paulina Fusi M.D.   On: 06/17/2021 18:43   ECHOCARDIOGRAM COMPLETE  Result Date: 06/19/2021    ECHOCARDIOGRAM REPORT   Patient Name:   TAM SAVOIA Date of Exam: 06/19/2021 Medical Rec #:  [id number]         Height:       66.0 in Accession #:    [id number]        Weight:       160.1 lb Date of Birth:  1956/01/30         BSA:          1.819 m Patient Age:    65 years          BP:           191/96 mmHg Patient Gender: F                 HR:           86 bpm. Exam Location:  Inpatient Procedure: 2D Echo, Cardiac Doppler, Color Doppler and Intracardiac            Opacification Agent Indications:    Stroke I63.9  History:        Patient has prior history of Echocardiogram examinations, most                 recent 03/06/2021. Stroke and COPD; Risk Factors:Diabetes,                 Hypertension and Dyslipidemia.  Sonographer:    Eulah Pont RDCS Referring Phys: [phone] DENISE A WOLFE IMPRESSIONS  1. Definity contrast shows early fibrinous thrombus formation in the apex.     . Left ventricular ejection fraction, by estimation, is 25 to 30%. The left ventricle has severely decreased function. The left ventricle demonstrates regional wall motion abnormalities (see scoring diagram/findings for description). The left ventricular internal cavity size was mildly dilated. Left ventricular diastolic parameters are consistent with Grade III diastolic dysfunction (restrictive). There is severe akinesis of the left ventricular, entire anteroseptal wall and apical segment.  2. Right ventricular systolic function is normal. The right ventricular size is normal.  3. The mitral valve is grossly normal. No evidence of mitral valve regurgitation.  4. The aortic valve is grossly normal. Aortic valve regurgitation is not visualized.  FINDINGS  Left Ventricle: Definity contrast shows early fibrinous thrombus formation in the apex. Left ventricular ejection fraction, by estimation, is 25 to 30%. The left ventricle has severely decreased function. The left ventricle demonstrates regional wall motion abnormalities. Severe akinesis of the left ventricular, entire anteroseptal wall and  apical segment. Definity contrast agent was given IV to delineate the left ventricular endocardial borders. The left ventricular internal cavity size was mildly dilated. There is no left ventricular hypertrophy. Left ventricular diastolic parameters are  consistent with Grade III diastolic dysfunction (restrictive). Right Ventricle: The right ventricular size is normal. Right vetricular wall thickness was not  well visualized. Right ventricular systolic function is normal. Left Atrium: Left atrial size was normal in size. Right Atrium: Right atrial size was normal in size. Pericardium: There is no evidence of pericardial effusion. Mitral Valve: The mitral valve is grossly normal. No evidence of mitral valve regurgitation. Tricuspid Valve: The tricuspid valve is grossly normal. Tricuspid valve regurgitation is trivial. Aortic Valve: The aortic valve is grossly normal. Aortic valve regurgitation is not visualized. Pulmonic Valve: The pulmonic valve was not well visualized. Pulmonic valve regurgitation is not visualized. Aorta: The aortic root and ascending aorta are structurally normal, with no evidence of dilitation. IAS/Shunts: The atrial septum is grossly normal.  LEFT VENTRICLE PLAX 2D LVIDd:         4.40 cm     Diastology LVIDs:         2.70 cm     LV e' medial:    5.10 cm/s LV PW:         0.80 cm     LV E/e' medial:  21.4 LV IVS:        0.80 cm     LV e' lateral:   6.40 cm/s LVOT diam:     1.80 cm     LV E/e' lateral: 17.0 LV SV:         39 LV SV Index:   21 LVOT Area:     2.54 cm  LV Volumes (MOD) LV vol d, MOD A2C: 49.6 ml LV vol d, MOD A4C: 53.4 ml LV vol s, MOD  A2C: 33.5 ml LV vol s, MOD A4C: 32.3 ml LV SV MOD A2C:     16.1 ml LV SV MOD A4C:     53.4 ml LV SV MOD BP:      17.7 ml RIGHT VENTRICLE RV S prime:     14.70 cm/s TAPSE (M-mode): 1.3 cm LEFT ATRIUM             Index        RIGHT ATRIUM          Index LA diam:        3.10 cm 1.70 cm/m   RA Area:     8.97 cm LA Vol (A2C):   27.3 ml 15.01 ml/m  RA Volume:   15.60 ml 8.58 ml/m LA Vol (A4C):   29.4 ml 16.16 ml/m LA Biplane Vol: 29.7 ml 16.33 ml/m  AORTIC VALVE LVOT Vmax:   91.85 cm/s LVOT Vmean:  64.100 cm/s LVOT VTI:    0.154 m  AORTA Ao Root diam: 3.10 cm Ao Asc diam:  3.30 cm MITRAL VALVE MV Area (PHT): 7.02 cm     SHUNTS MV Decel Time: 108 msec     Systemic VTI:  0.15 m MV E velocity: 109.00 cm/s  Systemic Diam: 1.80 cm MV A velocity: 115.00 cm/s MV E/A ratio:  0.95 Kristeen Miss MD Electronically signed by Kristeen Miss MD Signature Date/Time: 06/19/2021/5:36:09 PM    Final      Assessment and Plan:   New systolic and diastolic heart failure - LVEF 16-10% and grade 3 DD - BNP not collected - may need to reconsider direct angiography given new reduced EF and wall motion abnormality - will attempt to start medical therapy with BB, continue statin - she has refused ACEI in the past - may consider stopping amlodipine and starting ARB if ARNI is a possibility   New wall motion abnormality - akinesis of anteroseptal and apical segment - not clearly seen on echo  in July - does have a history of abnormal nuclear stress test, but has previously declined angiography   DM - A1c 5.7% - per primary   Hyperlipidemia with LDL less than 70  06/18/2021: Cholesterol 201; HDL 40; LDL Cholesterol 133; Triglycerides 140; VLDL 28 - has refused statin in the past - now on 80 mg lipitor   Suspected thrombus on echo - images not consistent with thrombus - no indication for heparin gtt - may resume antiplatelet therapy per neuro   I will arrange cardiology follow up.    Risk Assessment/Risk  Scores:      New York Heart Association (NYHA) Functional Class Unable to assess due to sedentary lifestyle/hemiparesis  For questions or updates, please contact [facility] Please consult [email or web address] for contact info under    Signed, Marcelino Duster, PA  06/20/2021 4:26 PM   I have seen and examined the patient along with Marcelino Duster, PA .  I have reviewed the chart, notes and new data.  I agree with PA's note.  Key new complaints: She is frustrated about being in the hospital, isolated, without visitors.  She denies experiencing chest pain or shortness of breath currently or in the past.  Biggest problems are gait instability and frequent falls following her previous strokes. Key examination changes: See my physical exam above Key new findings / data: I have reviewed her echocardiogram.  I do not think there is any evidence of left ventricular thrombus.  There is simply swirling from Definity microbubble contrast.  There is however hypokinesis of the distal anterior and anteroseptal walls suggestive of previous infarction/ischemia in the LAD artery distribution.  Again, I have a disagreement with the estimation of the LVEF which I estimate to be closer to 40-45 %.  The electrocardiogram shows sinus rhythm with left anterior fascicular block, which may mask previous infarction, but there is no evidence of acute repolarization changes.  PLAN: I think it is very likely that Jade [name] has coronary artery disease, in particular with a stenosis in the distribution of the mid LAD artery.  Having said that, she does not have any angina or heart failure symptoms.  It is possible that she is asymptomatic due to her sedentary lifestyle following her strokes. It is also possible that she has wall motion abnormalities related to Takotsubo syndrome (stress cardiomyopathy) in the setting of a recent ischemic stroke, but this appears less likely. There is no compelling reason to proceed  with coronary angiography right now, while she is having an active COVID infection and shortly after her stroke.  Recommend scheduling her for outpatient coronary angiography after a few weeks of recovery. She has declined treatment with statins in the past, although she has evidence of extensive cerebrovascular disease and her LDL is severely elevated (as high as 184 in the recent past, most recently 133, target less than 70).  She is prescribed atorvastatin 80 mg daily in the hospital, which is quite appropriate, but I suspect that she will discontinue this when she is discharged. Will explore whether she is willing to try alternative medications such as Repatha. I do not think there is any indication for heparin or oral anticoagulants.  Would recommend her previous prescription of aspirin 81 mg plus clopidogrel. Due to depressed left ventricular ejection fraction, preferred treatment of her hypertension is with beta-blockers (carvedilol) and angiotensin receptor blocker (she has refused ACE inhibitors in the past due to perceived potential side effects, but I do not think she is  has anything against ARBs that I know of), rather than amlodipine.   Thurmon Fair, MD, [facility] [phone] 06/20/2021, 6:17 PM

## 2021-06-20 NOTE — Progress Notes (Addendum)
ANTICOAGULATION CONSULT NOTE  Pharmacy Consult for Heparin Indication:  LV Thrombus  Allergies  Allergen Reactions   Baclofen Other (See Comments)    Pt said felt weaker and had slurred speech   Nickel Dermatitis and Rash    Patient Measurements: Height: 5\' 6"  (167.6 cm) Weight: 72.6 kg (160 lb 0.9 oz) IBW/kg (Calculated) : 59.3  Vital Signs: Temp: 97.9 F (36.6 C) (10/25 0812) Temp Source: Oral (10/25 0812) BP: 153/77 (10/25 0812)  Labs: Recent Labs    06/17/21 1305 06/18/21 0231 06/19/21 0216 06/20/21 0114  HGB 15.0 14.5  --   --   HCT 45.3 43.7  --   --   PLT 304 284  --   --   CREATININE 1.14* 1.01* 1.22* 0.96    Estimated Creatinine Clearance: 58.8 mL/min (by C-G formula based on SCr of 0.96 mg/dL).  Assessment: Jade [name] is 65 y.o. with pertinent PMH HTN, HLD, T2DM, former smoker, multiple CVA with recent CVA in August , and left carotid artery stenosis s/p left transcarotid artery revascularization (04/06/2021) ,who presented with weakness and admitted for subacute stroke.  Found to have a new LV thrombus and starting heparin.  Goal of Therapy:  Heparin level 0.3-0.5 Monitor platelets by anticoagulation protocol: Yes   Plan:  Heparin 2000 unit IV bolus Then heparin IV drip 1000 units/hour Heparin level 8 hours after heparin begins. Daily heparin level and CBC  Thank 06/06/2021 06/20/2021,9:04 AM  I discussed / reviewed the pharmacy note by Mr. 06/22/2021 and I agree with the resident's findings and plans as documented.   Thank you Deno Etienne, PharmD

## 2021-06-20 NOTE — Progress Notes (Signed)
Physical Therapy Treatment Patient Details Name: RAMON BRANT MRN: [id number] DOB: Jan 23, 1956 Today's Date: 06/20/2021   History of Present Illness 65 y.o. female admitted 10/22 with increased bil weakness. 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  PMH: arthritis, CKD, COPD, CVA Aug 2022 with revascularization, fibromyalgia, gout, HTN, migraine, OSA, DM2    PT Comments    Pt pleasant but reports poor sleep due to alarms. Pt educated for progressing transfers and balance. Pt with continued RUE pain and hemiparesis which pt reports only pain is different from baseline on right UE.Pt able to progress to static standing with stedy and able to pivot to chair today with assist for weight shift and balance. Pt normally is mobile at home with assist of son and needs to demonstrate increased mobility to return home safely.     Recommendations for follow up therapy are one component of a multi-disciplinary discharge planning process, led by the attending physician.  Recommendations may be updated based on patient status, additional functional criteria and insurance authorization.  Follow Up Recommendations  Acute inpatient rehab (3hours/day)     Assistance Recommended at Discharge    Equipment Recommendations  Other (comment) (hoyer lift)    Recommendations for Other Services       Precautions / Restrictions Precautions Precautions: Fall;Other (comment) Precaution Comments: prior CVA R side affected from previous CVAs     Mobility  Bed Mobility Overal bed mobility: Needs Assistance Bed Mobility: Rolling;Sidelying to Sit Rolling: Max assist Sidelying to sit: Max assist       General bed mobility comments: pt able to roll to right with cues and assist of pad with increased time to position and advance bil LE with assist to elevate trunk from surface and clear legs     Transfers Overall transfer level: Needs assistance   Transfers: Sit to/from Stand;Stand Pivot Transfers Sit to Stand: Mod assist;+2 physical assistance           General transfer comment: pt with reliance on LUE and LLE to rise from EOB x 2 trials with cues for sequence and use of belt and pad. In standing pivot to left with max assist for weight shifting and mod assist for balance. pt then stood from recliner with stedy min +2 and able to maintain standing 1 min with min assist for balance due to posterior right lean    Ambulation/Gait             General Gait Details: unable at baseline   Stairs             Wheelchair Mobility    Modified Rankin (Stroke Patients Only)       Balance Overall balance assessment: Needs assistance Sitting-balance support: No upper extremity supported;Single extremity supported;Feet supported Sitting balance-Leahy Scale: Zero Sitting balance - Comments: pt with initial max assist EOB with posterior right lean with progression to min assist with mod cues EOB Postural control: Posterior lean;Right lateral lean Standing balance support: Single extremity supported Standing balance-Leahy Scale: Poor Standing balance comment: reliant on LUE support and min assist for balance                            Cognition Arousal/Alertness: Awake/alert Behavior During Therapy: WFL for tasks assessed/performed Overall Cognitive Status: Within Functional Limits for tasks assessed  Exercises      General Comments        Pertinent Vitals/Pain Pain Score: 6  Pain Location: RUE from elbow to wrist Pain Descriptors / Indicators: Tender;Discomfort;Aching Pain Intervention(s): Limited activity within patient's tolerance;Repositioned;Monitored during session    Home Living                          Prior Function            PT Goals (current goals can now be  found in the care plan section) Progress towards PT goals: Progressing toward goals    Frequency    Min 3X/week      PT Plan Current plan remains appropriate    Co-evaluation              AM-PAC PT "6 Clicks" Mobility   Outcome Measure  Help needed turning from your back to your side while in a flat bed without using bedrails?: A Lot Help needed moving from lying on your back to sitting on the side of a flat bed without using bedrails?: Total Help needed moving to and from a bed to a chair (including a wheelchair)?: Total Help needed standing up from a chair using your arms (e.g., wheelchair or bedside chair)?: Total Help needed to walk in hospital room?: Total Help needed climbing 3-5 steps with a railing? : Total 6 Click Score: 7    End of Session Equipment Utilized During Treatment: Gait belt Activity Tolerance: Patient tolerated treatment well Patient left: in chair;with call bell/phone within reach;with chair alarm set Nurse Communication: Mobility status;Need for lift equipment (use of stedy) PT Visit Diagnosis: Muscle weakness (generalized) (M62.81);Unsteadiness on feet (R26.81);Difficulty in walking, not elsewhere classified (R26.2)     Time: 8756-4332 PT Time Calculation (min) (ACUTE ONLY): 26 min  Charges:  $Therapeutic Activity: 23-37 mins                     Ardene Remley P, PT Acute Rehabilitation Services Pager: [phone] Office: [phone]    Marine Lezotte B Zaim Nitta 06/20/2021, 9:12 AM

## 2021-06-20 NOTE — Progress Notes (Signed)
Pt CBG for today   Morning, 140 Afternoon, 170 Evening,  127  Glucometer did not register pts cbg to the chart

## 2021-06-20 NOTE — Progress Notes (Signed)
STROKE TEAM PROGRESS NOTE   INTERVAL HISTORY Doing well.  No events overnight.  Stroke team called back as his echo showed thrombus.   Vitals:   06/19/21 2000 06/20/21 0000 06/20/21 0812 06/20/21 0907  BP: (!) 211/93 (!) 187/101 (!) 153/77   Pulse:    94  Resp: 15 19 12    Temp: 97.9 F (36.6 C)  97.9 F (36.6 C)   TempSrc: Oral  Oral   SpO2:   95% 98%  Weight:      Height:       CBC:  Recent Labs  Lab 06/14/21 2144 06/17/21 1305 06/18/21 0231  WBC 8.7 6.1 5.2  NEUTROABS 5.5  --   --   HGB 14.5 15.0 14.5  HCT 44.0 45.3 43.7  MCV 97.3 97.2 96.0  PLT 370 304 284   Basic Metabolic Panel:  Recent Labs  Lab 06/18/21 0231 06/19/21 0216 06/20/21 0114  NA 137 135 136  K 3.4* 4.1 4.4  CL 102 102 108  CO2 24 25 20*  GLUCOSE 95 134* 134*  BUN 19 22 18   CREATININE 1.01* 1.22* 0.96  CALCIUM 9.1 9.0 9.0  MG 2.0  --   --    Lipid Panel:  Recent Labs  Lab 06/18/21 0231  CHOL 201*  TRIG 140  HDL 40*  CHOLHDL 5.0  VLDL 28  LDLCALC *   HgbA1c:  Recent Labs  Lab 06/18/21 0231  HGBA1C 5.7*   Urine Drug Screen:  Recent Labs  Lab 06/18/21 1743  LABOPIA NONE DETECTED  COCAINSCRNUR NONE DETECTED  LABBENZ NONE DETECTED  AMPHETMU NONE DETECTED  THCU NONE DETECTED  LABBARB NONE DETECTED    Alcohol Level No results for input(s): ETH in the last 168 hours.  IMAGING past 24 hours ECHOCARDIOGRAM COMPLETE  Result Date: 06/19/2021    ECHOCARDIOGRAM REPORT   Patient Name:   Jade [name] Date of Exam: 06/19/2021 Medical Rec #:  Carmelina Peal         Height:       66.0 in Accession #:    06/21/2021        Weight:       160.1 lb Date of Birth:  1955-11-02         BSA:          1.819 m Patient Age:    65 years          BP:           191/96 mmHg Patient Gender: F                 HR:           86 bpm. Exam Location:  Inpatient Procedure: 2D Echo, Cardiac Doppler, Color Doppler and Intracardiac            Opacification Agent Indications:    Stroke I63.9  History:         Patient has prior history of Echocardiogram examinations, most                 recent 03/06/2021. Stroke and COPD; Risk Factors:Diabetes,                 Hypertension and Dyslipidemia.  Sonographer:    12/24/1955 RDCS Referring Phys: [phone] DENISE A WOLFE IMPRESSIONS  1. Definity contrast shows early fibrinous thrombus formation in the apex.     . Left ventricular ejection fraction, by estimation, is 25 to 30%. The left ventricle has severely decreased function.  The left ventricle demonstrates regional wall motion abnormalities (see scoring diagram/findings for description). The left ventricular internal cavity size was mildly dilated. Left ventricular diastolic parameters are consistent with Grade III diastolic dysfunction (restrictive). There is severe akinesis of the left ventricular, entire anteroseptal wall and apical segment.  2. Right ventricular systolic function is normal. The right ventricular size is normal.  3. The mitral valve is grossly normal. No evidence of mitral valve regurgitation.  4. The aortic valve is grossly normal. Aortic valve regurgitation is not visualized. FINDINGS  Left Ventricle: Definity contrast shows early fibrinous thrombus formation in the apex. Left ventricular ejection fraction, by estimation, is 25 to 30%. The left ventricle has severely decreased function. The left ventricle demonstrates regional wall motion abnormalities. Severe akinesis of the left ventricular, entire anteroseptal wall and  apical segment. Definity contrast agent was given IV to delineate the left ventricular endocardial borders. The left ventricular internal cavity size was mildly dilated. There is no left ventricular hypertrophy. Left ventricular diastolic parameters are  consistent with Grade III diastolic dysfunction (restrictive). Right Ventricle: The right ventricular size is normal. Right vetricular wall thickness was not well visualized. Right ventricular systolic function is normal. Left Atrium:  Left atrial size was normal in size. Right Atrium: Right atrial size was normal in size. Pericardium: There is no evidence of pericardial effusion. Mitral Valve: The mitral valve is grossly normal. No evidence of mitral valve regurgitation. Tricuspid Valve: The tricuspid valve is grossly normal. Tricuspid valve regurgitation is trivial. Aortic Valve: The aortic valve is grossly normal. Aortic valve regurgitation is not visualized. Pulmonic Valve: The pulmonic valve was not well visualized. Pulmonic valve regurgitation is not visualized. Aorta: The aortic root and ascending aorta are structurally normal, with no evidence of dilitation. IAS/Shunts: The atrial septum is grossly normal.  LEFT VENTRICLE PLAX 2D LVIDd:         4.40 cm     Diastology LVIDs:         2.70 cm     LV e' medial:    5.10 cm/s LV PW:         0.80 cm     LV E/e' medial:  21.4 LV IVS:        0.80 cm     LV e' lateral:   6.40 cm/s LVOT diam:     1.80 cm     LV E/e' lateral: 17.0 LV SV:         39 LV SV Index:   21 LVOT Area:     2.54 cm  LV Volumes (MOD) LV vol d, MOD A2C: 49.6 ml LV vol d, MOD A4C: 53.4 ml LV vol s, MOD A2C: 33.5 ml LV vol s, MOD A4C: 32.3 ml LV SV MOD A2C:     16.1 ml LV SV MOD A4C:     53.4 ml LV SV MOD BP:      17.7 ml RIGHT VENTRICLE RV S prime:     14.70 cm/s TAPSE (M-mode): 1.3 cm LEFT ATRIUM             Index        RIGHT ATRIUM          Index LA diam:        3.10 cm 1.70 cm/m   RA Area:     8.97 cm LA Vol (A2C):   27.3 ml 15.01 ml/m  RA Volume:   15.60 ml 8.58 ml/m LA Vol (A4C):   29.4 ml 16.16 ml/m  LA Biplane Vol: 29.7 ml 16.33 ml/m  AORTIC VALVE LVOT Vmax:   91.85 cm/s LVOT Vmean:  64.100 cm/s LVOT VTI:    0.154 m  AORTA Ao Root diam: 3.10 cm Ao Asc diam:  3.30 cm MITRAL VALVE MV Area (PHT): 7.02 cm     SHUNTS MV Decel Time: 108 msec     Systemic VTI:  0.15 m MV E velocity: 109.00 cm/s  Systemic Diam: 1.80 cm MV A velocity: 115.00 cm/s MV E/A ratio:  0.95 Kristeen Miss MD Electronically signed by Kristeen Miss MD  Signature Date/Time: 06/19/2021/5:36:09 PM    Final     PHYSICAL EXAM  Pleasant middle-aged obese Caucasian lady not in distress. . Afebrile. Head is nontraumatic. Neck is supple without bruit.    Cardiac exam no murmur or gallop. Lungs are clear to auscultation. Distal pulses are well felt.  Neurological Exam :  She is awake alert oriented to time place and person.  Speech is clear without dysarthria or aphasia.  Extraocular movements are full range without nystagmus.  Face is symmetric without weakness.  Tongue is midline.  Motor system exam shows right hemiparesis with 3/5 right upper extremity strength proximally with secondary weakness of right grip and intrinsic hand muscles.  Orbits left over right upper extremity.  4/5 strength in both lower extremities with trace weakness of right hip flexors and ankle dorsiflexors.  Sensation is symmetric bilaterally.  Deep tendon feels are symmetric.  Gait not tested.  ASSESSMENT/PLAN Ms. MANIYAH MOLLER is a 65 y.o. female with history of hypertension, hyperlipidemia, type 2 diabetes, CKD stage IIIa, left carotid stenosis status post left transthoracic carotid artery revascularization, CVA in August 2022 who presented to the ED with generalized weakness.  Subacute new bilateral corpus callosal ischemic strokes initially felt secondary to small vessels disease, also with subacute left frontal MCA branch infarct in August 2022 secondary to left carotid stenosis s/p revascularization - now w/ identified apical clot and likely source of embolic infarcts. CT head No acute abnormality. Small vessel disease. Atrophy. Expected evolution of previously demonstrated infarct in the posterior left frontal lobe.  MRI  Late subacute L parietal and cortical infarctions without extension. 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 These were not present in August of this year but are not acute insults.  Extensive chronic small-vessel ischemic changes of the pons, thalami, basal ganglia and hemispheric white matter. Chronic blood products in the deep white matter adjacent to the anterior body of the left lateral ventricle, unchanged. 2D Echo EF 25-30%. Severe HK. LV apical embolus  UDS negative  LDL 133 HgbA1c 5.7 VTE prophylaxis - IV heparin aspirin 81 mg daily and clopidogrel 75 mg daily prior to admission, now on aspirin 81 mg daily and heparin IV, off Brilinta - continue IV heparin. Pharmacy following. Will stop aspirin Therapy recommendations:  CIR Disposition:  pending  LV Thrombus 2D Echo EF 25-30%. Severe HK. LV apical embolus  On IV heparin Recommend Cardiology consult to follow apical thrombus  Hypertension Home meds: amlodipine 10 mg, lisinopril 10 mg, HCTZ 12. Seems to be noncomplaint with medications UnStable Permissive hypertension (OK if < 220/120) but gradually normalize in 5-7 days Long-term BP goal normotensive  Hyperlipidemia Home meds:  atorvastatin 80mg , resumed in hospital LDL 133, goal < 70 noncompliant Continue statin at discharge  Diabetes type II Controlled Home meds:  glimepiride HgbA1c 5.7, goal < 7.0  Other Stroke Risk  Factors Advanced Age >/= 6  Hx stroke/TIA  Other Active Problems COVID 19, on isolation CKD stage 3  Hypokalemia, resolved Diarrhea, for OP workup  Hospital day # 3   Total of 30 mins spent reviewing chart, discussion with patient and family on prognosis, Dx and plan. Discussed case with patient's nurse. Reviewed Imaging personally.   To contact Stroke Continuity provider, please refer to [email or web address]. After hours, contact General Neurology

## 2021-06-20 NOTE — Progress Notes (Signed)
Pt b/p  is still elevated no change, MD is aware  06/20/21 1700  Vitals  BP (!) 203/91  MAP (mmHg) 134  BP Location Left Arm  BP Method Automatic  Patient Position (if appropriate) Lying  Pulse Rate Source Monitor  ECG Heart Rate 95  Resp 12  MEWS COLOR  MEWS Score Color Yellow  Oxygen Therapy  SpO2 98 %  O2 Device Room Air  MEWS Score  MEWS Temp 0  MEWS Systolic 2  MEWS Pulse 0  MEWS RR 1  MEWS LOC 0  MEWS Score 3

## 2021-06-20 NOTE — TOC Benefit Eligibility Note (Signed)
Patient Product/process development scientist completed.    The patient is currently admitted and upon discharge could be taking Brilinta 90 mg.  The current 30 day co-pay is, $0.00.   The patient is insured through [employer]    Roland Earl, CPhT Pharmacy [facility] Antimicrobial Stewardship Team Direct Number: [phone]  Fax: [phone]

## 2021-06-20 NOTE — Progress Notes (Signed)
New orders placed for b/p management

## 2021-06-21 ENCOUNTER — Inpatient Hospital Stay (HOSPITAL_COMMUNITY): Payer: Medicare Other

## 2021-06-21 DIAGNOSIS — E7800 Pure hypercholesterolemia, unspecified: Secondary | ICD-10-CM | POA: Diagnosis not present

## 2021-06-21 DIAGNOSIS — I519 Heart disease, unspecified: Secondary | ICD-10-CM

## 2021-06-21 DIAGNOSIS — I639 Cerebral infarction, unspecified: Secondary | ICD-10-CM | POA: Diagnosis not present

## 2021-06-21 DIAGNOSIS — R4701 Aphasia: Secondary | ICD-10-CM | POA: Diagnosis not present

## 2021-06-21 DIAGNOSIS — I6330 Cerebral infarction due to thrombosis of unspecified cerebral artery: Secondary | ICD-10-CM | POA: Diagnosis not present

## 2021-06-21 DIAGNOSIS — R531 Weakness: Secondary | ICD-10-CM | POA: Diagnosis not present

## 2021-06-21 DIAGNOSIS — E1149 Type 2 diabetes mellitus with other diabetic neurological complication: Secondary | ICD-10-CM | POA: Diagnosis not present

## 2021-06-21 LAB — BASIC METABOLIC PANEL
Anion gap: 9 (ref 5–15)
Anion gap: 10 (ref 5–15)
BUN: 20 mg/dL (ref 8–23)
BUN: 27 mg/dL — ABNORMAL HIGH (ref 8–23)
CO2: 19 mmol/L — ABNORMAL LOW (ref 22–32)
CO2: 21 mmol/L — ABNORMAL LOW (ref 22–32)
Calcium: 9 mg/dL (ref 8.9–10.3)
Calcium: 9.4 mg/dL (ref 8.9–10.3)
Chloride: 107 mmol/L (ref 98–111)
Chloride: 107 mmol/L (ref 98–111)
Creatinine, Ser: 1.2 mg/dL — ABNORMAL HIGH (ref 0.44–1.00)
Creatinine, Ser: 1.65 mg/dL — ABNORMAL HIGH (ref 0.44–1.00)
GFR, Estimated: 50 mL/min — ABNORMAL LOW (ref >60)
GFR, Estimated: 34 mL/min — ABNORMAL LOW (ref >60)
Glucose, Bld: 153 mg/dL — ABNORMAL HIGH (ref 70–99)
Glucose, Bld: 164 mg/dL — ABNORMAL HIGH (ref 70–99)
Potassium: 5.2 mmol/L — ABNORMAL HIGH (ref 3.5–5.1)
Potassium: 4.9 mmol/L (ref 3.5–5.1)
Sodium: 135 mmol/L (ref 135–145)
Sodium: 138 mmol/L (ref 135–145)

## 2021-06-21 LAB — CBC
HCT: 41.9 % (ref 36.0–46.0)
Hemoglobin: 14.1 g/dL (ref 12.0–15.0)
MCH: 32.2 pg (ref 26.0–34.0)
MCHC: 33.7 g/dL (ref 30.0–36.0)
MCV: 95.7 fL (ref 80.0–100.0)
Platelets: 281 10*3/uL (ref 150–400)
RBC: 4.38 MIL/uL (ref 3.87–5.11)
RDW: 13 % (ref 11.5–15.5)
WBC: 6.9 10*3/uL (ref 4.0–10.5)
nRBC: 0 % (ref 0.0–0.2)

## 2021-06-21 LAB — GLUCOSE, CAPILLARY
Glucose-Capillary: 173 mg/dL — ABNORMAL HIGH (ref 70–99)
Glucose-Capillary: 140 mg/dL — ABNORMAL HIGH (ref 70–99)
Glucose-Capillary: 148 mg/dL — ABNORMAL HIGH (ref 70–99)
Glucose-Capillary: 153 mg/dL — ABNORMAL HIGH (ref 70–99)
Glucose-Capillary: 186 mg/dL — ABNORMAL HIGH (ref 70–99)

## 2021-06-21 MED ORDER — IOHEXOL 350 MG/ML SOLN
60.0000 mL | Freq: Once | INTRAVENOUS | Status: AC | PRN
  Administered 2021-06-21: 60 mL via INTRAVENOUS

## 2021-06-21 MED ORDER — LACTATED RINGERS IV BOLUS
500.0000 mL | Freq: Once | INTRAVENOUS | Status: AC
  Administered 2021-06-21: 500 mL via INTRAVENOUS

## 2021-06-21 NOTE — Code Documentation (Addendum)
Pt is a 65 yr old female admitted to [facility] for generalized weakness who was last known well this morning during med pass at 0830. At about 1040, RN noted that pt was having difficulty speaking. She was weak on the right side, but that was her baseline since admission. Pt assessed by Stroke team in room. She is aphasic, and weak on the right. LKW 0830. CBG WNL. Pt examined in room, then taken to CT by stroke team. CTNC negative for acute hemorrhage per Dr Derry Lory. CTA obtained after additional IV access obtained. CTA negative for LVO per Dr Derry Lory.  Returned to 2 W 13. Bedside handoff with Adron Bene. No thrombolytic as pt with recent stroke as well as recent anticoagulation. No NIR as CTA LVO negative.

## 2021-06-21 NOTE — Progress Notes (Signed)
CSW spoke with Verdon Cummins at [facility].  They can potentially take a patient with covid prior to the end of isolation period of the pt is asymptomatic.  Per RN, pt is asymptomatic.  Referral sent through hub. Daleen Squibb, MSW, LCSW 10/26/20222:37 PM

## 2021-06-21 NOTE — Progress Notes (Addendum)
  Inpatient Rehabilitation Admissions Coordinator   Inpatient rehab prescreen received on 10/23. Noted COVID + 10/22. Patients are eligible to be considered for admit to the [facility] when cleared from airborne precautions by acute MD or Infectious disease regardless of onset day.  I discussed with Greg with TOC.  I need clarification of when air borne precautions likely to be removed as to facilitate Venue options. Patient currently not at a level to purse CIR admit.Please call me with any questions.    Ottie Glazier, RN, MSN Rehab Admissions Coordinator [phone] 06/21/2021 10:41 AM

## 2021-06-21 NOTE — Plan of Care (Signed)
  Problem: Education: Goal: Knowledge of General Education information will improve Description: Including pain rating scale, medication(s)/side effects and non-pharmacologic comfort measures Outcome: Progressing   Problem: Clinical Measurements: Goal: Will remain free from infection Outcome: Progressing Goal: Diagnostic test results will improve Outcome: Progressing   Problem: Coping: Goal: Level of anxiety will decrease Outcome: Progressing   Problem: Elimination: Goal: Will not experience complications related to bowel motility Outcome: Progressing Goal: Will not experience complications related to urinary retention Outcome: Progressing   Problem: Coping: Goal: Psychosocial and spiritual needs will be supported Outcome: Progressing   Problem: Respiratory: Goal: Will maintain a patent airway Outcome: Progressing Goal: Complications related to the disease process, condition or treatment will be avoided or minimized Outcome: Progressing   Problem: Health Behavior/Discharge Planning: Goal: Ability to manage health-related needs will improve Outcome: Not Progressing   Problem: Clinical Measurements: Goal: Ability to maintain clinical measurements within normal limits will improve Outcome: Not Progressing Goal: Respiratory complications will improve Outcome: Not Progressing Goal: Cardiovascular complication will be avoided Outcome: Not Progressing   Problem: Activity: Goal: Risk for activity intolerance will decrease Outcome: Not Progressing   Problem: Nutrition: Goal: Adequate nutrition will be maintained Outcome: Not Progressing   Problem: Pain Managment: Goal: General experience of comfort will improve Outcome: Not Progressing   Problem: Education: Goal: Knowledge of risk factors and measures for prevention of condition will improve Outcome: Not Progressing

## 2021-06-21 NOTE — Progress Notes (Signed)
Occupational Therapy Treatment Patient Details Name: Jade [name] MRN: [id number] DOB: Jan 21, 1956 Today's Date: 06/21/2021   History of present illness 65 y.o. female admitted 10/22 with increased bil weakness. 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  PMH: arthritis, CKD, COPD, CVA Aug 2022 with revascularization, fibromyalgia, gout, HTN, migraine, OSA, DM2   OT comments  OT treatment session with focus on NMR, self-care re-education, bed mobility and functional tranfers with use of Stedy. Patient continues to require Mod A to Total A grossly for ADLs and is limited by decreased cognition, visual deficits, decreased activity tolerance, decreased sitting/standing balance and decreased activity tolerance. Patient would benefit from continued acute OT services in prep for safe d/c to next level of care. Recommendation for CIR remains appropriate.    Recommendations for follow up therapy are one component of a multi-disciplinary discharge planning process, led by the attending physician.  Recommendations may be updated based on patient status, additional functional criteria and insurance authorization.    Follow Up Recommendations  Acute inpatient rehab (3hours/day)    Assistance Recommended at Discharge Frequent or constant Supervision/Assistance  Equipment Recommendations  Other (comment) (Defer to next level of care)    Recommendations for Other Services Rehab consult    Precautions / Restrictions Precautions Precautions: Fall;Other (comment) Precaution Comments: Hx of CVA with R hemi Restrictions Weight Bearing Restrictions: No       Mobility Bed Mobility Overal bed mobility: Needs Assistance Bed Mobility: Rolling;Sidelying to Sit Rolling: Max assist Sidelying to sit: Max assist       General bed mobility comments: Max A for all parts of bed mobility with use of  chuck pad. Increased time/effort and cues for hand placement.    Transfers Overall transfer level: Needs assistance   Transfers: Sit to/from Stand;Stand Pivot Transfers Sit to Stand: Max assist;+2 physical assistance           General transfer comment: Attempted sit to stand from EOB and R knee block. Patient able to come to standing with Max A +2 but unable to attain/maintain midline. Strong posterior bias and flexed trunk. Required Mod A +2 for sit to stand in stedy.     Balance Overall balance assessment: Needs assistance Sitting-balance support: No upper extremity supported;Single extremity supported;Feet supported Sitting balance-Leahy Scale: Poor Sitting balance - Comments: Improved static sitting balance this date with Min A initially. Progressed to Min gaurd with cues for orientation to midline. Postural control: Posterior lean;Right lateral lean Standing balance support: Single extremity supported Standing balance-Leahy Scale: Poor Standing balance comment: Reliant on +2 external assist. Unable to attain upright position or stand for longer than 2-5 seconds.                           ADL either performed or assessed with clinical judgement   ADL Overall ADL's : Needs assistance/impaired Eating/Feeding: Set up;Sitting Eating/Feeding Details (indicate cue type and reason): Requires use of non-dominant LUE Grooming: Minimal assistance;Sitting               Lower Body Dressing: Total assistance;Bed level   Toilet Transfer: Total assistance Toilet Transfer Details (indicate cue type and reason): Required use of Stedy for transfer to recliner. Toileting- Clothing Manipulation and Hygiene: Total assistance;Bed level Toileting - Clothing Manipulation Details (indicate cue type and reason): Patient found to be incontient of bowel. Total A for hygiene/clothing management.  Vision   Additional Comments: Hx of CVA with visual deficits (primarily  double vision).   Perception     Praxis      Cognition Arousal/Alertness: Awake/alert Behavior During Therapy: Flat affect Overall Cognitive Status: Impaired/Different from baseline Area of Impairment: Orientation;Attention;Memory;Following commands;Awareness;Problem solving                 Orientation Level: Disoriented to;Time Current Attention Level: Sustained Memory: Decreased short-term memory Following Commands: Follows one step commands with increased time   Awareness: Emergent Problem Solving: Slow processing;Difficulty sequencing;Requires verbal cues;Requires tactile cues General Comments: Patient with decreased cognition this date compared to initial evaluation. A&Ox3. Requiring increased time to follow 1-step verbal commands. Increased time needed to process verbal information; slow to respond.          Exercises     Shoulder Instructions       General Comments      Pertinent Vitals/ Pain       Pain Assessment: Faces Faces Pain Scale: Hurts little more Pain Location: RUE and RLE with PROM/AAROM Pain Descriptors / Indicators: Tingling;Numbness;Sharp;Shooting Pain Intervention(s): Monitored during session;Limited activity within patient's tolerance;Repositioned  Home Living                                          Prior Functioning/Environment              Frequency  Min 2X/week        Progress Toward Goals  OT Goals(current goals can now be found in the care plan section)  Progress towards OT goals: Not progressing toward goals - comment (Slight decline in function this date compared to initial evaluaiotn.)  Acute Rehab OT Goals OT Goal Formulation: With patient Time For Goal Achievement: 07/02/21 Potential to Achieve Goals: Fair ADL Goals Pt Will Perform Grooming: with set-up;standing;sitting Pt/caregiver will Perform Home Exercise Program: Increased strength;Left upper extremity;With theraband;With written HEP  provided;With Supervision Additional ADL Goal #1: Pt will increase to tolerate x6 mins of OOB ADL tasks with minA overall. Additional ADL Goal #2: Patient will tolerate standing x1 min with Max A and use of stedy vs LRAD.  Plan Discharge plan remains appropriate;Frequency remains appropriate    Co-evaluation                 AM-PAC OT "6 Clicks" Daily Activity     Outcome Measure   Help from another person eating meals?: A Little Help from another person taking care of personal grooming?: A Little Help from another person toileting, which includes using toliet, bedpan, or urinal?: Total Help from another person bathing (including washing, rinsing, drying)?: A Lot Help from another person to put on and taking off regular upper body clothing?: A Lot Help from another person to put on and taking off regular lower body clothing?: Total 6 Click Score: 12    End of Session Equipment Utilized During Treatment: Gait belt  OT Visit Diagnosis: Unsteadiness on feet (R26.81);Muscle weakness (generalized) (M62.81);Hemiplegia and hemiparesis Hemiplegia - Right/Left: Right Hemiplegia - dominant/non-dominant: Dominant Hemiplegia - caused by: Cerebral infarction   Activity Tolerance Patient tolerated treatment well;Patient limited by fatigue   Patient Left in chair;with call bell/phone within reach;with chair alarm set   Nurse Communication Mobility status;Other (comment) (Decreased cognition)        Time: 6063-0160 OT Time Calculation (min): 45 min  Charges: OT General Charges $OT Visit: 1  Visit OT Treatments $Self Care/Home Management : 38-52 mins  Somnang Mahan H. OTR/L Supplemental OT, Department of rehab services [phone]  Everlee Quakenbush R H. 06/21/2021, 11:13 AM

## 2021-06-21 NOTE — Progress Notes (Addendum)
NEUROLOGY CONSULTATION PROGRESS NOTE   Date of service: June 21, 2021 Patient Name: Jade [name] MRN:  [id number] DOB:  September 08, 1955  Brief HPI  Jade [name] is a 65 y.o. female with history of hypertension, hyperlipidemia, type 2 diabetes, CKD stage IIIa, left carotid stenosis status post left transthoracic carotid artery revascularization, CVA in August 2022 who presented to the ED with generalized weakness.   MRI Brain on 06/17/21 with no recent extension of the noted stroke in august 2022. CTA with multifocal multivessel stenosis. TTE concerning for a potential thrombus but cardiology consult team did not think so. Her heparin was stopped last night.  Has baseline R sided weakness from stroke in august noted to be 3/5 on RUE strength testing by stroke team yesterday and 4/5 in both lower extremities with trace weakness of right hip flexors and ankle dorsiflexors.  Sensation is symmetric bilaterally. Speech was noted to be clear.   Interval Hx   Stroke code activated today for concern for new onset aphasia and potential worsening of R sided weakness. Was started on home hypertensives and significant drop in BP from 190s systolic, down to 80s systolic.  NIHSS components Score: Comment  1a Level of Conscious 0[x]  1[]  2[]  3[]      1b LOC Questions 0[]  1[]  2[x]       1c LOC Commands 0[x]  1[]  2[]       2 Best Gaze 0[x]  1[]  2[]       3 Visual 0[x]  1[]  2[]  3[]      4 Facial Palsy 0[x]  1[]  2[]  3[]      5a Motor Arm - left 0[x]  1[]  2[]  3[]  4[]  UN[]    5b Motor Arm - Right 0[]  1[]  2[]  3[x]  4[]  UN[]    6a Motor Leg - Left 0[x]  1[]  2[]  3[]  4[]  UN[]    6b Motor Leg - Right 0[]  1[]  2[]  3[x]  4[]  UN[]    7 Limb Ataxia 0[x]  1[]  2[]  3[]  UN[]     8 Sensory 0[x]  1[]  2[]  UN[]      9 Best Language 0[]  1[x]  2[]  3[]    Wouldnot answer my questions but does talk spotaneously with clear legible speech. She follows commands.  10 Dysarthria 0[x]  1[]  2[]  UN[]      11 Extinct. and Inattention 0[x]  1[]  2[]        TOTAL:       Vitals   Vitals:   06/21/21 0422 06/21/21 0423 06/21/21 0800 06/21/21 0828  BP:  (!) 190/138 (!) 89/61 91/66  Pulse: 98 94    Resp:  16 18   Temp: 98.7 F (37.1 C)  97.6 F (36.4 C)   TempSrc: Oral  Oral   SpO2:  91% 97%   Weight:      Height:         Body mass index is 25.83 kg/m.  Physical Exam   General: Laying comfortably in bed; in no acute distress.  HENT: Normal oropharynx and mucosa. Normal external appearance of ears and nose.  Neck: Supple, no pain or tenderness  CV: No JVD. No peripheral edema.  Pulmonary: Symmetric Chest rise. Normal respiratory effort.  Abdomen: Soft to touch, non-tender.  Ext: No cyanosis, edema, or deformity  Skin: No rash. Normal palpation of skin.   Musculoskeletal: Normal digits and nails by inspection. No clubbing.   Neurologic Examination  Mental status/Cognition: Alert, oriented to self,, wont answer rest of the orientation questions. Speech/language: does not asnwer orientation questions but did talk to me during the later part of the exam. Speech was fluent, comprehension  intact. Cranial nerves:   CN II Pupils equal and reactive to light, blinks to threat BL   CN III,IV,VI EOM intact, no gaze preference or deviation, no nystagmus    CN V normal sensation in V1, V2, and V3 segments bilaterally   CN VII no asymmetry, no nasolabial fold flattening   CN VIII normal hearing to speech   CN IX & X    CN XI    CN XII midline tongue protrusion   Motor:  Muscle bulk: normal, tone normal Mvmt Root Nerve  Muscle Right Left Comments  SA C5/6 Ax Deltoid 0    EF C5/6 Mc Biceps 0 5   EE C6/7/8 Rad Triceps 0 5   WF C6/7 Med FCR     WE C7/8 PIN ECU     F Ab C8/T1 U ADM/FDI 1 5   HF L1/2/3 Fem Illopsoas 2 4   KE L2/3/4 Fem Quad     DF L4/5 D Peron Tib Ant 1 5   PF S1/2 Tibial Grc/Sol 1 5    Reflexes:  Right Left Comments  Pectoralis      Biceps (C5/6) 2 2   Brachioradialis (C5/6) 2 2    Triceps (C6/7) 2 2     Patellar (L3/4) 1 1    Achilles (S1)      Hoffman      Plantar withdraws withdraws   Jaw jerk    Sensation:  Light touch Responds to pinch in BL lower extremities. Intact to light touch in BL arms and face.   Pin prick    Temperature    Vibration   Proprioception    Coordination/Complex Motor:  - Finger to Nose intact in LUE - Heel to shin unable to do - Rapid alternating movement are slowed in LUE - Gait: unsafe to assess given the extent of the R leg weakness.  Labs   Basic Metabolic Panel:  Lab Results  Component Value Date   NA 135 06/21/2021   K 5.2 (H) 06/21/2021   CO2 19 (L) 06/21/2021   GLUCOSE 153 (H) 06/21/2021   BUN 20 06/21/2021   CREATININE 1.20 (H) 06/21/2021   CALCIUM 9.0 06/21/2021   GFRNONAA 50 (L) 06/21/2021   GFRAA >60 03/23/2018   HbA1c:  Lab Results  Component Value Date   HGBA1C 5.7 (H) 06/18/2021   LDL:  Lab Results  Component Value Date   LDLCALC 133 (H) 06/18/2021   Urine Drug Screen:     Component Value Date/Time   LABOPIA NONE DETECTED 06/18/2021 1743   COCAINSCRNUR NONE DETECTED 06/18/2021 1743   LABBENZ NONE DETECTED 06/18/2021 1743   AMPHETMU NONE DETECTED 06/18/2021 1743   THCU NONE DETECTED 06/18/2021 1743   LABBARB NONE DETECTED 06/18/2021 1743    Alcohol Level     Component Value Date/Time   ETH <10 04/01/2021 2022   No results found for: PHENYTOIN, ZONISAMIDE, LAMOTRIGINE, LEVETIRACETA No results found for: PHENYTOIN, PHENOBARB, VALPROATE, CBMZ  Imaging and Diagnostic studies   CTH W/o Contrast(Code stroke CT) Personally reviewed and CTH was negative for a large hypodensity concerning for a large territory infarct or hyperdensity concerning for an ICH  CTA Head and neck: Personally reviewed and notable for multifocal multivessl stenosis, no obvious LVO. No significant change compared to prior CTA from 04/06/21.  Impression   Jade [name] is a 65 y.o. female with PMH significant for istory of hypertension,  hyperlipidemia, type 2 diabetes, CKD stage IIIa, left carotid stenosis status post left  transthoracic carotid artery revascularization, CVA in August 2022 who is admitted with subacute stroke with R sided weakness, COVID 19+, elevated Creatinine which is now resolved and hypertension and potential concern for a LV thrombus on TTE althou cardiology consult team did not think it was thrombus. Stroke code activated today for new aphasia.and worsening R sided weakness.  Not a candidate for tNK given recent strokes, not a candidate for thrombectomy 2/2 no LVO  Recommendations  - continue Aspirin 81mg  daily along with Brilinta 90mg  BID. Heparin was discontinued last night. - Permissive hypertension, treat with PRN if BP is above 220/110. Hold home HTN meds. - Fluids at 12ml/hr for 24 hours - Recommend MRI Brain w/o contrast - Stroke team to follow. _________________________________________________________  Plan discussed with the IM residency team in person.  This patient is critically ill and at significant risk of neurological worsening, death and care requires constant monitoring of vital signs, hemodynamics,respiratory and cardiac monitoring, neurological assessment, discussion with family, other specialists and medical decision making of high complexity. I spent 40 minutes of neurocritical care time  in the care of  this patient. This was time spent independent of any time provided by nurse practitioner or PA.  [facility] Pager Number [phone] 06/21/2021  12:02 PM   Thank you for the opportunity to take part in the care of this patient. If you have any further questions, please contact the neurology consultation attending.  Signed,  [phone] [facility] Pager Number 06/23/2021

## 2021-06-21 NOTE — Progress Notes (Signed)
Progress Note  Patient Name: CRYSTALLEE WERDEN Date of Encounter: 06/21/2021  [facility] Cardiologist: Thurmon Fair, MD   Subjective   Had some problems with severely elevated blood pressure last evening and overnight and received multiple antihypertensive medications all at once, slightly hypotensive this morning at 90/66.  The little woozy, but speaking coherently, alert and oriented with stimulation.  Inpatient Medications    Scheduled Meds:  aspirin EC  81 mg Oral Daily   atorvastatin  80 mg Oral Daily   carvedilol  3.125 mg Oral BID WC   dantrolene  50 mg Oral QHS   enoxaparin (LOVENOX) injection  40 mg Subcutaneous Q24H   hydrochlorothiazide  25 mg Oral Daily   insulin aspart  0-5 Units Subcutaneous QHS   insulin aspart  0-9 Units Subcutaneous TID WC   losartan  25 mg Oral Daily   tamsulosin  0.4 mg Oral QPC supper   ticagrelor  90 mg Oral BID   Continuous Infusions:  PRN Meds: acetaminophen **OR** acetaminophen, loperamide, ondansetron **OR** ondansetron (ZOFRAN) IV, senna-docusate   Vital Signs    Vitals:   06/21/21 0422 06/21/21 0423 06/21/21 0800 06/21/21 0828  BP:  (!) 190/138 (!) 89/61 91/66  Pulse: 98 94    Resp:  16 18   Temp: 98.7 F (37.1 C)  97.6 F (36.4 C)   TempSrc: Oral  Oral   SpO2:  91% 97%   Weight:      Height:        Intake/Output Summary (Last 24 hours) at 06/21/2021 6834 Last data filed at 06/20/2021 1804 Gross per 24 hour  Intake 56.67 ml  Output 600 ml  Net -543.33 ml   Last 3 Weights 06/18/2021 06/17/2021 06/14/2021  Weight (lbs) 160 lb 0.9 oz 170 lb 167 lb 15.9 oz  Weight (kg) 72.6 kg 77.111 kg 76.2 kg      Telemetry    Normal sinus rhythm- Personally Reviewed  ECG    No new tracing- Personally Reviewed  Physical Exam  Appears older than stated age and frail GEN: No acute distress.   Neck: No JVD Cardiac: RRR, no murmurs, rubs, or gallops.  Respiratory: Clear to auscultation bilaterally. GI: Soft,  nontender, non-distended  MS: No edema; No deformity. Neuro:  3/5 right upper extremity paresis, 4/5 weakness in both lower extremities, slightly worse on left Psych: Depressed affect   Labs    High Sensitivity Troponin:  No results for input(s): TROPONINIHS in the last 720 hours.   Chemistry Recent Labs  Lab 06/18/21 0231 06/19/21 0216 06/20/21 0114 06/21/21 0322  NA 137 135 136 135  K 3.4* 4.1 4.4 5.2*  CL 102 102 108 107  CO2 24 25 20* 19*  GLUCOSE 95 134* 134* 153*  BUN 19 22 18 20   CREATININE 1.01* 1.22* 0.96 1.20*  CALCIUM 9.1 9.0 9.0 9.0  MG 2.0  --   --   --   GFRNONAA >60 49* >60 50*  ANIONGAP 11 8 8 9     Lipids  Recent Labs  Lab 06/18/21 0231  CHOL 201*  TRIG 140  HDL 40*  LDLCALC 133*  CHOLHDL 5.0    Hematology Recent Labs  Lab 06/17/21 1305 06/18/21 0231 06/21/21 0322  WBC 6.1 5.2 6.9  RBC 4.66 4.55 4.38  HGB 15.0 14.5 14.1  HCT 45.3 43.7 41.9  MCV 97.2 96.0 95.7  MCH 32.2 31.9 32.2  MCHC 33.1 33.2 33.7  RDW 13.2 13.2 13.0  PLT 304 284 281  Thyroid  Recent Labs  Lab 06/18/21 0231  TSH 1.909    BNPNo results for input(s): BNP, PROBNP in the last 168 hours.  DDimer No results for input(s): DDIMER in the last 168 hours.   Radiology    ECHOCARDIOGRAM COMPLETE  Result Date: 06/19/2021    ECHOCARDIOGRAM REPORT   Patient Name:   MAKYLAH BOSSARD Date of Exam: 06/19/2021 Medical Rec #:  [id number]         Height:       66.0 in Accession #:    [id number]        Weight:       160.1 lb Date of Birth:  06-02-1956         BSA:          1.819 m Patient Age:    65 years          BP:           191/96 mmHg Patient Gender: F                 HR:           86 bpm. Exam Location:  Inpatient Procedure: 2D Echo, Cardiac Doppler, Color Doppler and Intracardiac            Opacification Agent Indications:    Stroke I63.9  History:        Patient has prior history of Echocardiogram examinations, most                 recent 03/06/2021. Stroke and COPD; Risk  Factors:Diabetes,                 Hypertension and Dyslipidemia.  Sonographer:    Eulah Pont RDCS Referring Phys: [phone] DENISE A WOLFE IMPRESSIONS  1. Definity contrast shows early fibrinous thrombus formation in the apex.     . Left ventricular ejection fraction, by estimation, is 25 to 30%. The left ventricle has severely decreased function. The left ventricle demonstrates regional wall motion abnormalities (see scoring diagram/findings for description). The left ventricular internal cavity size was mildly dilated. Left ventricular diastolic parameters are consistent with Grade III diastolic dysfunction (restrictive). There is severe akinesis of the left ventricular, entire anteroseptal wall and apical segment.  2. Right ventricular systolic function is normal. The right ventricular size is normal.  3. The mitral valve is grossly normal. No evidence of mitral valve regurgitation.  4. The aortic valve is grossly normal. Aortic valve regurgitation is not visualized. FINDINGS  Left Ventricle: Definity contrast shows early fibrinous thrombus formation in the apex. Left ventricular ejection fraction, by estimation, is 25 to 30%. The left ventricle has severely decreased function. The left ventricle demonstrates regional wall motion abnormalities. Severe akinesis of the left ventricular, entire anteroseptal wall and  apical segment. Definity contrast agent was given IV to delineate the left ventricular endocardial borders. The left ventricular internal cavity size was mildly dilated. There is no left ventricular hypertrophy. Left ventricular diastolic parameters are  consistent with Grade III diastolic dysfunction (restrictive). Right Ventricle: The right ventricular size is normal. Right vetricular wall thickness was not well visualized. Right ventricular systolic function is normal. Left Atrium: Left atrial size was normal in size. Right Atrium: Right atrial size was normal in size. Pericardium: There is no  evidence of pericardial effusion. Mitral Valve: The mitral valve is grossly normal. No evidence of mitral valve regurgitation. Tricuspid Valve: The tricuspid valve is grossly normal. Tricuspid valve regurgitation is trivial. Aortic Valve: The  aortic valve is grossly normal. Aortic valve regurgitation is not visualized. Pulmonic Valve: The pulmonic valve was not well visualized. Pulmonic valve regurgitation is not visualized. Aorta: The aortic root and ascending aorta are structurally normal, with no evidence of dilitation. IAS/Shunts: The atrial septum is grossly normal.  LEFT VENTRICLE PLAX 2D LVIDd:         4.40 cm     Diastology LVIDs:         2.70 cm     LV e' medial:    5.10 cm/s LV PW:         0.80 cm     LV E/e' medial:  21.4 LV IVS:        0.80 cm     LV e' lateral:   6.40 cm/s LVOT diam:     1.80 cm     LV E/e' lateral: 17.0 LV SV:         39 LV SV Index:   21 LVOT Area:     2.54 cm  LV Volumes (MOD) LV vol d, MOD A2C: 49.6 ml LV vol d, MOD A4C: 53.4 ml LV vol s, MOD A2C: 33.5 ml LV vol s, MOD A4C: 32.3 ml LV SV MOD A2C:     16.1 ml LV SV MOD A4C:     53.4 ml LV SV MOD BP:      17.7 ml RIGHT VENTRICLE RV S prime:     14.70 cm/s TAPSE (M-mode): 1.3 cm LEFT ATRIUM             Index        RIGHT ATRIUM          Index LA diam:        3.10 cm 1.70 cm/m   RA Area:     8.97 cm LA Vol (A2C):   27.3 ml 15.01 ml/m  RA Volume:   15.60 ml 8.58 ml/m LA Vol (A4C):   29.4 ml 16.16 ml/m LA Biplane Vol: 29.7 ml 16.33 ml/m  AORTIC VALVE LVOT Vmax:   91.85 cm/s LVOT Vmean:  64.100 cm/s LVOT VTI:    0.154 m  AORTA Ao Root diam: 3.10 cm Ao Asc diam:  3.30 cm MITRAL VALVE MV Area (PHT): 7.02 cm     SHUNTS MV Decel Time: 108 msec     Systemic VTI:  0.15 m MV E velocity: 109.00 cm/s  Systemic Diam: 1.80 cm MV A velocity: 115.00 cm/s MV E/A ratio:  0.95 Kristeen Miss MD Electronically signed by Kristeen Miss MD Signature Date/Time: 06/19/2021/5:36:09 PM    Final     Cardiac Studies   Echo 06/19/21:  1. Definity  contrast shows early fibrinous thrombus formation in the  apex.      . Left ventricular ejection fraction, by estimation, is 25 to 30%. The  left ventricle has severely decreased function. The left ventricle  demonstrates regional wall motion abnormalities (see scoring  diagram/findings for description). The left  ventricular internal cavity size was mildly dilated. Left ventricular  diastolic parameters are consistent with Grade III diastolic dysfunction  (restrictive). There is severe akinesis of the left ventricular, entire  anteroseptal wall and apical segment.   2. Right ventricular systolic function is normal. The right ventricular  size is normal.   3. The mitral valve is grossly normal. No evidence of mitral valve  regurgitation.   4. The aortic valve is grossly normal. Aortic valve regurgitation is not  visualized.      Echo 03/06/21:  1. Difficult study due to poor definition of the LV endocardial border.  Recommend definity contrast for future studies. Left ventricular ejection  fraction, by estimation, is 55 to 60%. The left ventricle has normal  function. Left ventricular endocardial   border not optimally defined to evaluate regional wall motion, but  appears grossly normal. There is mild concentric left ventricular  hypertrophy. Left ventricular diastolic parameters are consistent with  Grade I diastolic dysfunction (impaired  relaxation).   2. Right ventricular systolic function is normal. The right ventricular  size is normal. Tricuspid regurgitation signal is inadequate for assessing  PA pressure.   3. The mitral valve is normal in structure. No evidence of mitral valve  regurgitation. No evidence of mitral stenosis.   4. The aortic valve is tricuspid. Aortic valve regurgitation is not  visualized. No aortic stenosis is present.   5. The inferior vena cava is normal in size with greater than 50%  respiratory variability, suggesting right atrial pressure of 3 mmHg.    Patient Profile     65 y.o. female with type 2 diabetes mellitus and mixed hyperlipidemia (refusing statins), hypertension, severe cerebrovascular atherosclerotic disease and multiple strokes, diagnosed with new left ventricular regional wall motion abnormalities and left ventricular systolic dysfunction on echocardiogram.  Consulted for report of possible LV apical thrombus (not confirmed on my review of images)  Assessment & Plan    She does not have angina pectoris.  She does not have dyspnea at rest/orthopnea/PND and appears clinically euvolemic. My review of her echocardiogram does not confirm the suspicion for left ventricular thrombus (there is swirling of Definity microbubble contrast).  And I believe that her LVEF is 40-45%, with a wall motion abnormality suggestive of ischemia/infarction in the mid-distal LAD artery distribution.  There is no corresponding change on the electrocardiogram, but she has left anterior fascicular block which could mask anterior infarction.  Differential diagnosis is with Takotsubo syndrome related to her recent stroke. With recent stroke and active coronavirus infection, she is not a good candidate for invasive coronary evaluation. Recommend medical therapy with ARB and carvedilol, avoid quick acting vasodilators such as hydralazine which can lead to sudden deleterious hypotension in this patient with extensive cerebrovascular disease. I do not think anticoagulation is recommended.  Continue aspirin and clopidogrel. Atorvastatin 80 mg daily has been started and should be continued (hopefully she will agree to this)     For questions or updates, please contact [facility] Please consult [email or web address] for contact info under        Signed, Thurmon Fair, MD  06/21/2021, 9:24 AM

## 2021-06-21 NOTE — Progress Notes (Signed)
CSW unsuccessfully attempted to reach pt for SNF assess by phone, both room phone and cell phone.  Daleen Squibb, MSW, LCSW 10/26/20228:35 AM

## 2021-06-21 NOTE — Progress Notes (Signed)
Code stroke called and seen by Dr. Derry Lory. Discussed case with him, thought to be due to hypotension sBP 80's. CT, CTA neg. MRI pending.  Pt seen briefly.  She is more confused than yesterday. Otherwise no new weakness. Recommend watching BP closely and consider 250cc bolus to bring pressure back up to goal sBP>100. Decrease his HTN medication. Discussed with RN.

## 2021-06-21 NOTE — Progress Notes (Addendum)
Jade [name] is 65 y.o. with pertinent PMH HTN, HLD, T2DM, former smoker ,multiple CVA with recent CVA in August , and left carotid artery stenosis s/p left transcarotid artery revascularization (04/06/2021) ,who presented with weakness and admitted for subacute stroke.  Subjective:  Overnight: Hypertensive yesterday afternoon and evening.  When the patient was seen this morning, she appeared to be non-interactive. She was sitting in her recliner. She did not answer any question and if she did she was slow to respond. Her blood pressure was 90s/60s. Code stroke was called. No evidence of stroke was found. When patient later came back to her floor, she was more responsive near her baseline.   Interval: Patient evaluated later in the afternoon and appears to be doing well and is back at her baseline. She did endorse not remembering getting imaging but was alert and oriented x4.      Objective:  Vital signs in last 24 hours: Vitals:   06/18/21 0041 06/18/21 0042 06/18/21 0423 06/18/21 0739  BP: (!) 176/75  (!) 186/82 (!) 168/79  Pulse: 70  72 66  Resp: 18  20 15   Temp: 98.8 F (37.1 C)  97.8 F (36.6 C) 98.1 F (36.7 C)  TempSrc: Oral   Oral  SpO2: 98%  96% 98%  Weight:  72.6 kg    Height:       Supplemental O2: Room Air SpO2: 98 %  Physical Exam General: Sitting on recliner, quiet and un-interactive.  Head: Normocephalic without scalp lesions.  Eyes: Conjunctivae pink, sclerae white, without jaundice. PERL. Mouth: Lips normal color, without lesions. Moist mucus membrane. Neck: Neck supple with full range of motion (ROM).  Lungs: CTAB, no wheeze, rhonchi or rales.  Cardiovascular: Normal heart sounds Abdomen: No TTP, normal bowel sounds MSK: No asymmetry or muscle atrophy. No injuries noted.  Skin: no lesions Neuro: Alert and oriented. CNII-XII intact, weakness in right shoulder shrug still present. 2/5 RUE, weak grip, 3/5 RLE. Good left side grip, 4/5 LUE, 4/5 LLE.   Psych: Normal mood and normal affect    CBC Latest Ref Rng & Units 06/18/2021 06/17/2021 06/14/2021  WBC 4.0 - 10.5 K/uL 5.2 6.1 8.7  Hemoglobin 12.0 - 15.0 g/dL 06/16/2021 83.1 51.7  Hematocrit 36.0 - 46.0 % 43.7 45.3 44.0  Platelets 150 - 400 K/uL 284 304 370    CMP Latest Ref Rng & Units 06/18/2021 06/17/2021 06/14/2021  Glucose 70 - 99 mg/dL 95 06/16/2021) 073(X)  BUN 8 - 23 mg/dL 19 18 20   Creatinine 0.44 - 1.00 mg/dL 106(Y) ) 6.94(W  Sodium 135 - 145 mmol/L 137 136 142  Potassium 3.5 - 5.1 mmol/L 3.4(L) 3.6 3.6  Chloride 98 - 111 mmol/L 102 101 105  CO2 22 - 32 mmol/L 24 21(L) 26  Calcium 8.9 - 10.3 mg/dL 9.1 9.3 5.46(E  Total Protein 6.5 - 8.1 g/dL - - -  Total Bilirubin 0.3 - 1.2 mg/dL - - -  Alkaline Phos 38 - 126 U/L - - -  AST 15 - 41 U/L - - -  ALT 0 - 44 U/L - - -  Lipid Panel     Component Value Date/Time   CHOL 201 (H) 06/18/2021 0231   TRIG 140 06/18/2021 0231   HDL 40 (L) 06/18/2021 0231   CHOLHDL 5.0 06/18/2021 0231   VLDL 28 06/18/2021 0231   LDLCALC 133 (H) 06/18/2021 0231   LDLDIRECT 164.7 08/24/2013 0848     Filed Weights   06/17/21 1333 06/18/21  0042  Weight: 77.1 kg 72.6 kg    No intake or output data in the 24 hours ending 06/18/21 0939 Net IO Since Admission: No IO data has been entered for this period [06/18/21 0939]  Echo 06/21/21:  1. Definity contrast shows early fibrinous thrombus formation in the  apex. Left ventricular ejection fraction, by estimation, is 25 to 30%. The  left ventricle has severely decreased function. The left ventricle  demonstrates regional wall motion abnormalities (see scoring  diagram/findings for description). The left  ventricular internal cavity size was mildly dilated. Left ventricular  diastolic parameters are consistent with Grade III diastolic dysfunction  (restrictive). There is severe akinesis of the left ventricular, entire  anteroseptal wall and apical segment.   2. Right ventricular systolic function is  normal. The right ventricular  size is normal.   3. The mitral valve is grossly normal. No evidence of mitral valve  regurgitation.   4. The aortic valve is grossly normal. Aortic valve regurgitation is not  visualized.   CTA Head Neck W WO Contrast IMPRESSION: 1. No emergent finding or change from 3 days ago. 2. Advanced intracranial atherosclerosis, especially bilateral proximal PCA and M2 segments. 3. Patent left carotid stent.   Assessment/Plan: Jade [name] is 65 y.o. with pertinent PMH HTN, HLD, T2DM, former smoker ,multiple CVA with recent CVA in August , and left carotid artery stenosis s/p left transcarotid artery revascularization (04/06/2021) ,who presented with weakness and admitted for subacute stroke.   LV Thrombus Subacute CVA Weakness Patient presented with increased weakness and found to have subacute CVA. Most likely in the setting of non-compliance as patient states she was out of all her medications for a while and upon further clarifications stated about 2-3 weeks. Echo showed the above findings with decreased EF and a LV thrombus. Started on heparin gtt for LV thrombus. Cardiology consulted; they do not believe it is a thrombus and her EF is estimated to be at 45%. Heparin gtt stopped. After BP regimen change, patient was hypotensive this morning with less responsiveness, a code stroke was called but CTA was negative for acute findings. Will order MRI per stroke team recommendations.  -Neurology still following -IV heparin discontinued per cardiology and patient placed back on Aspirin and Brillinta.  - Control modifiable risk factors: HTN, HLD, T2DM - Continue Atorvastatin 80 mg - BP goal: Permissive HTN for goal of systolic BP>100. Treat if >220/110; hold anti-hypertensives for now - Telemetry monitoring - PT/OT recommends inpatient rehab which require her to be cleared from COVID.   Hypokalemia (resolved) Patient's K is 5.2 on 06/21/21. Magnesium wnl.   -Replete for a goal of 4. -Repeat BMP in the afternoon.   Covid-19  - Asymptomatic , positive on screening test. Has not received Covid Vaccine.  -Continue to monitor. If patient develops hypoxia will consider initiation of steroids.  Hypertension -Home medications amlodipine 10 mg, lisinopril 10 mg, HCTZ 12.5. Patient placed on a new regimen yesterday after having significant hypertension and had hypotensive episode this morning.  -Permissive HTN <220/110 for 24 hours then slowly start BP meds.  -BP meds held current -2-500 ml LR bolus given.    Hyperlipidemia - Chronic lipid panel showing cholesterol 201, and LDL 133. - Continue Atorvastatin 80 mg   CKD Stage 3a (stable) -Baseline creatinine 1.1-1.3. Creatinine 1.20 on 06/21/21. - Continue to monitor   Type 2 Diabetes Mellitus  - Hemoglobin A1c 6.5 03/2021, home medication glimepiride. HbA1c 5.7 06/18/21. CBGs mostly <180. - SSI-S,  Will add basal if increase in SSI usage.   Diarrhea Chronic. States bowel movement soon after eating.  -Will continue imodium daily. Recommend OP workup.  -Continue to monitor.   Diet: Carb-Modified VTE: Enoxaparin IVF: None,None Code: DNR   Prior to Admission Living Arrangement: Home, living with assistance from son Anticipated Discharge Location: CIR per PT/OT recommendations Barriers to Discharge: Workup as above, placement   Dispo: Admit patient to Inpatient with expected length of stay greater than 2 midnights. Gwenevere Abbot, MD Eligha Bridegroom. [facility], PGY-1  Pager: [phone] After 5 pm and on weekends: Please call the on-call pager

## 2021-06-22 DIAGNOSIS — I69998 Other sequelae following unspecified cerebrovascular disease: Secondary | ICD-10-CM

## 2021-06-22 DIAGNOSIS — E1149 Type 2 diabetes mellitus with other diabetic neurological complication: Secondary | ICD-10-CM

## 2021-06-22 DIAGNOSIS — I519 Heart disease, unspecified: Secondary | ICD-10-CM | POA: Diagnosis not present

## 2021-06-22 DIAGNOSIS — I10 Essential (primary) hypertension: Secondary | ICD-10-CM

## 2021-06-22 DIAGNOSIS — E7800 Pure hypercholesterolemia, unspecified: Secondary | ICD-10-CM | POA: Diagnosis not present

## 2021-06-22 DIAGNOSIS — I6330 Cerebral infarction due to thrombosis of unspecified cerebral artery: Secondary | ICD-10-CM | POA: Diagnosis not present

## 2021-06-22 DIAGNOSIS — R531 Weakness: Secondary | ICD-10-CM | POA: Diagnosis not present

## 2021-06-22 LAB — BASIC METABOLIC PANEL
Anion gap: 7 (ref 5–15)
BUN: 28 mg/dL — ABNORMAL HIGH (ref 8–23)
CO2: 22 mmol/L (ref 22–32)
Calcium: 9 mg/dL (ref 8.9–10.3)
Chloride: 107 mmol/L (ref 98–111)
Creatinine, Ser: 1.55 mg/dL — ABNORMAL HIGH (ref 0.44–1.00)
GFR, Estimated: 37 mL/min — ABNORMAL LOW (ref >60)
Glucose, Bld: 125 mg/dL — ABNORMAL HIGH (ref 70–99)
Potassium: 3.9 mmol/L (ref 3.5–5.1)
Sodium: 136 mmol/L (ref 135–145)

## 2021-06-22 LAB — CBC
HCT: 38 % (ref 36.0–46.0)
Hemoglobin: 12.3 g/dL (ref 12.0–15.0)
MCH: 31.2 pg (ref 26.0–34.0)
MCHC: 32.4 g/dL (ref 30.0–36.0)
MCV: 96.4 fL (ref 80.0–100.0)
Platelets: 257 10*3/uL (ref 150–400)
RBC: 3.94 MIL/uL (ref 3.87–5.11)
RDW: 13.1 % (ref 11.5–15.5)
WBC: 7.7 10*3/uL (ref 4.0–10.5)
nRBC: 0 % (ref 0.0–0.2)

## 2021-06-22 LAB — GLUCOSE, CAPILLARY
Glucose-Capillary: 149 mg/dL — ABNORMAL HIGH (ref 70–99)
Glucose-Capillary: 135 mg/dL — ABNORMAL HIGH (ref 70–99)
Glucose-Capillary: 133 mg/dL — ABNORMAL HIGH (ref 70–99)
Glucose-Capillary: 110 mg/dL — ABNORMAL HIGH (ref 70–99)

## 2021-06-22 MED ORDER — CARVEDILOL 3.125 MG PO TABS
3.1250 mg | ORAL_TABLET | Freq: Two times a day (BID) | ORAL | Status: DC
  Administered 2021-06-22 – 2021-06-23 (×2): 3.125 mg via ORAL
  Filled 2021-06-22 (×2): qty 1

## 2021-06-22 NOTE — TOC Initial Note (Signed)
Transition of Care [facility]) - Initial/Assessment Note    Patient Details  Name: Jade [name] MRN: [id number] Date of Birth: 04/08/1956  Transition of Care [facility]) CM/SW Contact:    Lorri Frederick, LCSW Phone Number: 06/22/2021, 1:21 PM  Clinical Narrative:   CSW spoke with pt by phone for initial assessment due to pt being covid +.  Pt confirmed interest in CIR.  Discussed isolation period for her covid, option of [facility] prior to completing quarantine.  Pt reports she would prefer CIR due to the location.  Permission given to speak with son [name].  Current DME: walker.  Pt reports she has a wheelchair for home ordered but it has not been delivered.  PCP in place.                  Expected Discharge Plan: IP Rehab Facility Barriers to Discharge: Continued Medical Work up   Patient Goals and CMS Choice Patient states their goals for this hospitalization and ongoing recovery are:: improve mobility issues so she can better navigate her house      Expected Discharge Plan and Services Expected Discharge Plan: IP Rehab Facility In-house Referral: Clinical Social Work   Post Acute Care Choice: IP Rehab Living arrangements for the past 2 months: Single Family Home                                      Prior Living Arrangements/Services Living arrangements for the past 2 months: Single Family Home Lives with:: Spouse, Adult Children Patient language and need for interpreter reviewed:: Yes Do you feel safe going back to the place where you live?: Yes      Need for Family Participation in Patient Care: No (Comment) Care giver support system in place?: Yes (comment)   Criminal Activity/Legal Involvement Pertinent to Current Situation/Hospitalization: No - Comment as needed  Activities of Daily Living Home Assistive Devices/Equipment: Bedside commode/3-in-1, CBG Meter, Eyeglasses, Walker (specify type), Wheelchair ADL Screening (condition at time of  admission) Patient's cognitive ability adequate to safely complete daily activities?: Yes Is the patient deaf or have difficulty hearing?: No Does the patient have difficulty seeing, even when wearing glasses/contacts?: No Does the patient have difficulty concentrating, remembering, or making decisions?: No Patient able to express need for assistance with ADLs?: Yes Does the patient have difficulty dressing or bathing?: Yes Independently performs ADLs?: No Communication: Independent Dressing (OT): Needs assistance Is this a change from baseline?: Pre-admission baseline Grooming: Needs assistance Is this a change from baseline?: Pre-admission baseline Feeding: Needs assistance (setup assistance) Is this a change from baseline?: Pre-admission baseline Bathing: Needs assistance Is this a change from baseline?: Pre-admission baseline Toileting: Independent with device (comment) In/Out Bed: Needs assistance Is this a change from baseline?: Pre-admission baseline Walks in Home: Needs assistance Is this a change from baseline?: Pre-admission baseline Does the patient have difficulty walking or climbing stairs?: Yes Weakness of Legs: Both Weakness of Arms/Hands: Both  Permission Sought/Granted Permission sought to share information with : Family Supports Permission granted to share information with : Yes, Verbal Permission Granted  Share Information with NAME: on [place].  Pt reports husband is spanish speaking, would prefer we communicate with her son.  Permission granted to share info w AGENCY: CIR        Emotional Assessment Appearance:: Other (Comment Required (phone assessment: covid +) Attitude/Demeanor/Rapport: Engaged Affect (typically observed): Appropriate, Pleasant Orientation: :  Oriented to Self, Oriented to Place, Oriented to  Time, Oriented to Situation Alcohol / Substance Use: Not Applicable Psych Involvement: No (comment)  Admission diagnosis:  Weakness  [R53.1] Weakness due to old stroke [I69.998, R53.1] Patient Active Problem List   Diagnosis Date Noted   Cerebral thrombosis with cerebral infarction 06/19/2021   Weakness due to old stroke 06/17/2021   Labile blood glucose    Essential hypertension    Spastic hemiparesis (HCC)    Stage 3a chronic kidney disease (HCC)    Controlled type 2 diabetes mellitus with hyperglycemia, without long-term current use of insulin (HCC)    CVA (cerebral vascular accident) (HCC) 04/13/2021   TIA (transient ischemic attack) 04/02/2021   Carotid artery stenosis with cerebral infarction (HCC) 04/02/2021   COPD (chronic obstructive pulmonary disease) (HCC) 04/02/2021   Stroke-like symptoms 03/05/2021   Acute CVA (cerebrovascular accident) (HCC) 03/21/2018   Obesity, Class III, BMI 40-49.9 (morbid obesity) (HCC) 03/21/2018   IBS (irritable bowel syndrome) 04/12/2015   Hair loss 10/20/2014   Primary osteoarthritis of left knee 09/29/2014   Visit for screening mammogram 08/30/2014   Routine general medical examination at a health care facility 08/30/2014   Tinea corporis 12/21/2013   Essential hypertension, benign 12/21/2013   Low back pain 08/24/2013   Patient noncompliant with statin medication 02/23/2013   H/O abnormal Pap smear 10/24/2012   Osteopenia 09/26/2012   Diabetic neuropathy, painful (HCC) 05/26/2012   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   Pure hypercholesterolemia 03/20/2012   Chronic venous insufficiency 03/20/2012   PCP:  Nathaneil Canary, PA-C Pharmacy:   [facility] [place], [place] - [address] [place] [address] Phone: [phone] Fax: [phone]     Social Determinants of Health (SDOH) Interventions    Readmission Risk Interventions No flowsheet data found.

## 2021-06-22 NOTE — Progress Notes (Addendum)
Progress Note  Patient Name: Jade [name] Date of Encounter: 06/22/2021  [facility] Cardiologist: Thurmon Fair, MD   Subjective   Worsening neuro deficits during hypotension late yesterday morning, but seems to be back to previous baseline.  No angina. No dyspnea. NSR on telemetry. BP now high again - all her BP meds were discontinued  Inpatient Medications    Scheduled Meds:  aspirin EC  81 mg Oral Daily   atorvastatin  80 mg Oral Daily   carvedilol  3.125 mg Oral BID WC   dantrolene  50 mg Oral QHS   enoxaparin (LOVENOX) injection  40 mg Subcutaneous Q24H   insulin aspart  0-5 Units Subcutaneous QHS   insulin aspart  0-9 Units Subcutaneous TID WC   tamsulosin  0.4 mg Oral QPC supper   ticagrelor  90 mg Oral BID   Continuous Infusions:  PRN Meds: acetaminophen **OR** acetaminophen, loperamide, ondansetron **OR** ondansetron (ZOFRAN) IV, senna-docusate   Vital Signs    Vitals:   06/21/21 2050 06/22/21 0028 06/22/21 0400 06/22/21 0829  BP: (!) 174/73 (!) 172/85 (!) 186/85 (!) 167/86  Pulse: 84 82 76 84  Resp:    20  Temp: 98 F (36.7 C) 97.6 F (36.4 C) 97.9 F (36.6 C) 97.6 F (36.4 C)  TempSrc: Oral Oral Oral Axillary  SpO2:    97%  Weight:      Height:        Intake/Output Summary (Last 24 hours) at 06/22/2021 0840 Last data filed at 06/22/2021 0600 Gross per 24 hour  Intake 504.16 ml  Output 1 ml  Net 503.16 ml   Last 3 Weights 06/18/2021 06/17/2021 06/14/2021  Weight (lbs) 160 lb 0.9 oz 170 lb 167 lb 15.9 oz  Weight (kg) 72.6 kg 77.111 kg 76.2 kg      Telemetry    NSR - Personally Reviewed  ECG    No new tracing - Personally Reviewed  Physical Exam  Alert GEN: No acute distress.   Neck: No JVD Cardiac: RRR, no murmurs, rubs, or gallops.  Respiratory: Clear to auscultation bilaterally. GI: Soft, nontender, non-distended  MS: No edema; No deformity. Neuro: R upper extremity hemiparesis Psych: Depressed affect   Labs     High Sensitivity Troponin:  No results for input(s): TROPONINIHS in the last 720 hours.   Chemistry Recent Labs  Lab 06/18/21 0231 06/19/21 0216 06/21/21 0322 06/21/21 1544 06/22/21 0219  NA 137   < > 135 138 136  K 3.4*   < > 5.2* 4.9 3.9  CL 102   < > 107 107 107  CO2 24   < > 19* 21* 22  GLUCOSE 95   < > 153* 164* 125*  BUN 19   < > 20 27* 28*  CREATININE 1.01*   < > 1.20* 1.65* 1.55*  CALCIUM 9.1   < > 9.0 9.4 9.0  MG 2.0  --   --   --   --   GFRNONAA >60   < > 50* 34* 37*  ANIONGAP 11   < > 9 10 7    < > = values in this interval not displayed.    Lipids  Recent Labs  Lab 06/18/21 0231  CHOL 201*  TRIG 140  HDL 40*  LDLCALC 133*  CHOLHDL 5.0    Hematology Recent Labs  Lab 06/18/21 0231 06/21/21 0322 06/22/21 0219  WBC 5.2 6.9 7.7  RBC 4.55 4.38 3.94  HGB 14.5 14.1 12.3  HCT 43.7  41.9 38.0  MCV 96.0 95.7 96.4  MCH 31.9 32.2 31.2  MCHC 33.2 33.7 32.4  RDW 13.2 13.0 13.1  PLT 284 281 257   Thyroid  Recent Labs  Lab 06/18/21 0231  TSH 1.909    BNPNo results for input(s): BNP, PROBNP in the last 168 hours.  DDimer No results for input(s): DDIMER in the last 168 hours.   Radiology    MR BRAIN WO CONTRAST  Result Date: 06/22/2021 CLINICAL DATA:  Initial evaluation for neuro deficit, stroke suspected, mental status change. EXAM: MRI HEAD WITHOUT CONTRAST TECHNIQUE: Multiplanar, multiecho pulse sequences of the brain and surrounding structures were obtained without intravenous contrast. COMPARISON:  Prior CT from earlier the same day as well as recent brain MRI from 06/17/2021. FINDINGS: Brain: Age-related cerebral atrophy with fairly advanced chronic microvascular ischemic disease. Multiple remote lacunar infarcts about the hemispheric cerebral white matter and deep gray nuclei again noted, stable. Evolving subacute infarct involving the posterior left frontoparietal region again noted, stable. Additional evolving subacute infarcts about the splenium are  also unchanged. No evidence for hemorrhagic transformation or significant mass effect. Subtle 5 mm focus of diffusion abnormality seen involving the subcortical white matter of the left parietooccipital region (series 9, image 90). Few additional punctate ischemic infarcts noted involving the bilateral hippocampi (series 9, image 78 on the left, series 9, image 79 on the right). These are new from previous, inter consistent with new acute ischemic infarcts. No associated hemorrhage or mass effect. No acute intracranial hemorrhage elsewhere within the brain. Chronic hemorrhage involving the left anterior genu of the corpus callosum noted, stable. Additional punctate chronic microhemorrhage noted at the posterior limb of the left internal capsule, also unchanged. No mass lesion, midline shift or mass effect. No hydrocephalus. Benign arachnoid cyst at the right middle cranial fossa noted. No other extra-axial fluid collection. Pituitary gland and suprasellar region within normal limits. Vascular: Major intracranial vascular flow voids are maintained. Skull and upper cervical spine: Craniocervical junction within normal limits. Bone marrow signal intensity normal. Degenerative spondylosis noted at C3-4 with resultant mild spinal stenosis. Remainder the visualized upper cervical spine otherwise unremarkable. No scalp soft tissue abnormality. Sinuses/Orbits: Globes orbital soft tissues demonstrate no acute finding. Mild scattered mucosal thickening noted within the ethmoidal air cells. Paranasal sinuses are otherwise largely clear. No mastoid effusion. Inner ear structures grossly normal. Other: None. IMPRESSION: 1. Three total new punctate ischemic infarcts involving the subcortical left parietooccipital region and bilateral hippocampi as above. No associated hemorrhage or mass effect. 2. No significant interval change in evolving subacute infarctions involving the left frontoparietal region and splenium. 3. Age-related  cerebral atrophy with advanced chronic microvascular ischemic disease, with multiple remote lacunar infarcts involving the hemispheric cerebral white matter and deep gray nuclei. Electronically Signed   By: Rise Mu M.D.   On: 06/22/2021 02:12   CT HEAD CODE STROKE WO CONTRAST  Result Date: 06/21/2021 CLINICAL DATA:  Code stroke.  Stroke/TIA EXAM: CT HEAD WITHOUT CONTRAST TECHNIQUE: Contiguous axial images were obtained from the base of the skull through the vertex without intravenous contrast. COMPARISON:  06/18/2021 FINDINGS: Brain: Chronic small-vessel ischemic changes affect the pons and cerebellum. Cerebral hemispheres show generalized atrophy. Chronic arachnoid cyst at the anterior middle cranial fossa on the right. Old small vessel infarctions of the thalami and basal ganglia. Extensive chronic small-vessel ischemic changes of the white matter. Old left parietal cortical and subcortical infarction. Vascular: There is atherosclerotic calcification of the major vessels at the base of  the brain. Skull: Negative Sinuses/Orbits: Clear/normal Other: None ASPECTS (Alberta Stroke Program Early CT Score) - Ganglionic level infarction (caudate, lentiform nuclei, internal capsule, insula, M1-M3 cortex): 7 - Supraganglionic infarction (M4-M6 cortex): 3 allowing for the old infarction. Total score (0-10 with 10 being normal): 10 allowing for the old infarction. IMPRESSION: 1. No acute finding by CT. Extensive chronic ischemic changes throughout the brain as outlined above. 2. ASPECTS is 10, allowing for the old left parietal infarction. 3. These results were communicated to Dr. Derry Lory at 11:15 am on 06/21/2021 by text page via the [facility] messaging system. Electronically Signed   By: Paulina Fusi M.D.   On: 06/21/2021 11:16   CT ANGIO HEAD NECK W WO CM (CODE STROKE)  Result Date: 06/21/2021 CLINICAL DATA:  Aphasia. EXAM: CT ANGIOGRAPHY HEAD AND NECK TECHNIQUE: Multidetector CT imaging of the head  and neck was performed using the standard protocol during bolus administration of intravenous contrast. Multiplanar CT image reconstructions and MIPs were obtained to evaluate the vascular anatomy. Carotid stenosis measurements (when applicable) are obtained utilizing NASCET criteria, using the distal internal carotid diameter as the denominator. CONTRAST:  26mL OMNIPAQUE IOHEXOL 350 MG/ML SOLN COMPARISON:  Three days ago FINDINGS: CTA NECK FINDINGS Aortic arch: Atheromatous calcification of the aorta. No acute finding Right carotid system: Common carotid tortuosity with atheromatous calcification. No ulceration or dissection. No flow limiting stenosis Left carotid system: Atheromatous calcification at the bifurcation with traversing stent. No in stent stenosis or adjacent dissection Vertebral arteries: Proximal subclavian atherosclerosis with irregular but mainly calcified plaque on the left. Vertebral arteries are smoothly contoured and widely patent to the dura. Skeleton: Negative for acute finding Other neck: Negative Upper chest: No acute finding Review of the MIP images confirms the above findings CTA HEAD FINDINGS Anterior circulation: Atheromatous calcification of the carotid siphons. Atheromatous irregularity of bilateral ACA and MCA branches. Unchanged high-grade narrowing of a left M2 branch at the sylvian fissure a marked on 13:89. High-grade wasting of the right even more proximal M2 branch. Negative for aneurysm Posterior circulation: Vertebral and basilar arteries are diffusely patent. Advanced bilateral PCA stenosis at the P2 segments with near flow gap on the left where the stenotic segment has an irregular shape, unchanged. Negative for aneurysm Venous sinuses: Unremarkable for the arterial phase Anatomic variants: None significant Review of the MIP images confirms the above findings IMPRESSION: 1. No emergent finding or change from 3 days ago. 2. Advanced intracranial atherosclerosis, especially  bilateral proximal PCA and M2 segments. 3. Patent left carotid stent. Electronically Signed   By: Tiburcio Pea M.D.   On: 06/21/2021 11:27    Cardiac Studies   Echo 06/19/21:  1. Definity contrast shows early fibrinous thrombus formation in the  apex.      . Left ventricular ejection fraction, by estimation, is 25 to 30%. The  left ventricle has severely decreased function. The left ventricle  demonstrates regional wall motion abnormalities (see scoring  diagram/findings for description). The left  ventricular internal cavity size was mildly dilated. Left ventricular  diastolic parameters are consistent with Grade III diastolic dysfunction  (restrictive). There is severe akinesis of the left ventricular, entire  anteroseptal wall and apical segment.   2. Right ventricular systolic function is normal. The right ventricular  size is normal.   3. The mitral valve is grossly normal. No evidence of mitral valve  regurgitation.   4. The aortic valve is grossly normal. Aortic valve regurgitation is not  visualized.  Echo 03/06/21:  1. Difficult study due to poor definition of the LV endocardial border.  Recommend definity contrast for future studies. Left ventricular ejection  fraction, by estimation, is 55 to 60%. The left ventricle has normal  function. Left ventricular endocardial   border not optimally defined to evaluate regional wall motion, but  appears grossly normal. There is mild concentric left ventricular  hypertrophy. Left ventricular diastolic parameters are consistent with  Grade I diastolic dysfunction (impaired  relaxation).   2. Right ventricular systolic function is normal. The right ventricular  size is normal. Tricuspid regurgitation signal is inadequate for assessing  PA pressure.   3. The mitral valve is normal in structure. No evidence of mitral valve  regurgitation. No evidence of mitral stenosis.   4. The aortic valve is tricuspid. Aortic valve  regurgitation is not  visualized. No aortic stenosis is present.   5. The inferior vena cava is normal in size with greater than 50%  respiratory variability, suggesting right atrial pressure of 3 mmHg.     Patient Profile     66 y.o. female with type 2 diabetes mellitus and mixed hyperlipidemia (refusing statins), hypertension, severe cerebrovascular atherosclerotic disease and multiple strokes, diagnosed with new left ventricular regional wall motion abnormalities and left ventricular systolic dysfunction on echocardiogram.  Consulted for report of possible LV apical thrombus (not confirmed on my review of images). Transient neuro deterioration 06/21/2021 due to hypotension.    Assessment & Plan    LV dysfunction:  regional abnormalities suggest CAD (but could be takotsubo sd. also). No clinical CHF.  All meds stopped. Resume low dose carvedilol. IF BP remains >140, start losartan 12.5 mg daily next. No need for diuretics. Repeat echo with Definity in another 2 weks or so. If wall motion still abnormal, recommend outpatient coronary CTAngio. Recurrent strokes:  attributable to extensive atherosclerotic cerebrovascular disease. No LV thrombus on my review of echo. On antiplatelet therapy. Avoid hypotension. HLP: atorvastatin 80 mg daily. She has refused statins before. Not sure she will take after DC. Target LDL<70.     For questions or updates, please contact [facility] Please consult [email or web address] for contact info under        Signed, Thurmon Fair, MD  06/22/2021, 8:40 AM

## 2021-06-22 NOTE — Plan of Care (Signed)
  Problem: Education: Goal: Knowledge of disease or condition will improve Outcome: Progressing Goal: Knowledge of secondary prevention will improve (SELECT ALL) Outcome: Progressing   Problem: Coping: Goal: Will verbalize positive feelings about self Outcome: Progressing   

## 2021-06-22 NOTE — ED Notes (Signed)
Confirmed pt is able to swallow properly, A&Ox 4, able to swallow water through straw before given medications

## 2021-06-22 NOTE — Progress Notes (Signed)
Physical Therapy Treatment Patient Details Name: Jade [name] MRN: [id number] DOB: [birth year] Today's Date: 06/22/2021   History of Present Illness 65 y.o. female admitted 10/22 with increased bil weakness. 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  PMH: arthritis, CKD, COPD, CVA Aug 2022 with revascularization, fibromyalgia, gout, HTN, migraine, OSA, DM2    PT Comments    Pt pleasant but reports fatigue and frustrated with meals. Pt able to transition to standing with stedy with min assist today and discussed grab bar for home for standing trials and strengthening. Pt reports bathroom is too small for rail or WC. Pt with left hip pain today as well as continued RUE pain. Pt normally has assist of son for all transfers and states she normally is mod assist for pivots but declined OOB to chair today due to fatigue. Will continue to follow.     Recommendations for follow up therapy are one component of a multi-disciplinary discharge planning process, led by the attending physician.  Recommendations may be updated based on patient status, additional functional criteria and insurance authorization.  Follow Up Recommendations  Acute inpatient rehab (3hours/day)     Assistance Recommended at Discharge Intermittent Supervision/Assistance  Equipment Recommendations  Other (comment) (hoyer lift)    Recommendations for Other Services       Precautions / Restrictions Precautions Precautions: Fall;Other (comment) Precaution Comments: Hx of CVA with R hemi     Mobility  Bed Mobility Overal bed mobility: Needs Assistance Bed Mobility: Rolling;Supine to Sit Rolling: Max assist         General bed mobility comments: max assist to roll bil for pericare and repositioning. Initially attempted rolling right for side to sit but pt unable to tolerate due to hip pain and required  assist to move RLE toward EOB and pivot to edge with HHA to elevate trunk from surface. Return to bed with max assist to lift legs to surface. Pt declined OOB to chair today    Transfers Overall transfer level: Needs assistance   Transfers: Sit to/from Stand Sit to Stand: Min assist           General transfer comment: min assist to stand with use of stedy and elevated bed x 3 trials with 2 trials from stedy pads. Pt able to maintain upright standing for 65 sec with min assist for balance with LUE support and cues for hip and trunk extension Transfer via Lift Equipment: Stedy  Ambulation/Gait             General Gait Details: unable at baseline   Stairs             Wheelchair Mobility    Modified Rankin (Stroke Patients Only)       Balance Overall balance assessment: Needs assistance Sitting-balance support: Single extremity supported;Feet unsupported Sitting balance-Leahy Scale: Fair Sitting balance - Comments: pt with improved static sitting balance at minguard throughout Postural control: Right lateral lean;Posterior lean Standing balance support: Single extremity supported Standing balance-Leahy Scale: Poor Standing balance comment: external support and LUE support on stedy                            Cognition Arousal/Alertness: Awake/alert Behavior During Therapy: Flat affect Overall Cognitive Status: Impaired/Different from baseline  Current Attention Level: Sustained         Problem Solving: Slow processing          Exercises      General Comments        Pertinent Vitals/Pain Pain Score: 5  Pain Location: RUE and left hip with movement Pain Descriptors / Indicators: Discomfort;Moaning;Guarding Pain Intervention(s): Limited activity within patient's tolerance;Repositioned;Monitored during session    Home Living                          Prior Function            PT Goals  (current goals can now be found in the care plan section) Progress towards PT goals: Progressing toward goals    Frequency    Min 3X/week      PT Plan Current plan remains appropriate    Co-evaluation              AM-PAC PT "6 Clicks" Mobility   Outcome Measure  Help needed turning from your back to your side while in a flat bed without using bedrails?: A Lot Help needed moving from lying on your back to sitting on the side of a flat bed without using bedrails?: A Lot Help needed moving to and from a bed to a chair (including a wheelchair)?: Total Help needed standing up from a chair using your arms (e.g., wheelchair or bedside chair)?: Total Help needed to walk in hospital room?: Total Help needed climbing 3-5 steps with a railing? : Total 6 Click Score: 8    End of Session Equipment Utilized During Treatment: Gait belt Activity Tolerance: Patient tolerated treatment well Patient left: in bed;with call bell/phone within reach;with bed alarm set Nurse Communication: Mobility status;Need for lift equipment PT Visit Diagnosis: Muscle weakness (generalized) (M62.81);Unsteadiness on feet (R26.81);Difficulty in walking, not elsewhere classified (R26.2)     Time: 2197-5883 PT Time Calculation (min) (ACUTE ONLY): 37 min  Charges:  $Therapeutic Activity: 23-37 mins                     Abhimanyu Cruces P, PT Acute Rehabilitation Services Pager: [phone] Office: [phone]    Juvia Aerts B Daphanie Oquendo 06/22/2021, 3:19 PM

## 2021-06-22 NOTE — Progress Notes (Addendum)
STROKE TEAM PROGRESS NOTE   INTERVAL HISTORY No family at the bedside. Pt lying in bed, awake alert, not in acute distress, still has right chronic hemiplegia. Dr. Royann Shivers has reviewed echo images, and confirmed that EF at least 40-45% and there is no LV thrombus. Eliquis was discontinued and now on antiplatelet.   Yesterday, pt had code stroke event for aphasia and worsening of right sided weakness. However, she was found to have low BP at 80s. MRI showed 3 total new punctate infarct at b/l hippocampus and left parietooccipital region, consistent with b/l PCA territory. It is known that she has b/l PCA high grade stenosis, therefore, the new stroke likely due to PCA stenosis in the setting of hypotension.   Vitals:   06/22/21 0400 06/22/21 0829 06/22/21 1200 06/22/21 1705  BP: (!) 186/85 (!) 167/86 (!) 162/81 (!) 172/83  Pulse: 76 84 91 96  Resp:  20 20 20   Temp: 97.9 F (36.6 C) 97.6 F (36.4 C) (!) 97.5 F (36.4 C) 98.1 F (36.7 C)  TempSrc: Oral Axillary Axillary Oral  SpO2:  97% 99% 96%  Weight:      Height:       CBC:  Recent Labs  Lab 06/21/21 0322 06/22/21 0219  WBC 6.9 7.7  HGB 14.1 12.3  HCT 41.9 38.0  MCV 95.7 96.4  PLT 281 257   Basic Metabolic Panel:  Recent Labs  Lab 06/18/21 0231 06/19/21 0216 06/21/21 1544 06/22/21 0219  NA 137   < > 138 136  K 3.4*   < > 4.9 3.9  CL 102   < > 107 107  CO2 24   < > 21* 22  GLUCOSE 95   < > 164* 125*  BUN 19   < > 27* 28*  CREATININE 1.01*   < > 1.65* 1.55*  CALCIUM 9.1   < > 9.4 9.0  MG 2.0  --   --   --    < > = values in this interval not displayed.   Lipid Panel:  Recent Labs  Lab 06/18/21 0231  CHOL 201*  TRIG 140  HDL 40*  CHOLHDL 5.0  VLDL 28  LDLCALC 06/20/21*   HgbA1c:  Recent Labs  Lab 06/18/21 0231  HGBA1C 5.7*   Urine Drug Screen:  Recent Labs  Lab 06/18/21 1743  LABOPIA NONE DETECTED  COCAINSCRNUR NONE DETECTED  LABBENZ NONE DETECTED  AMPHETMU NONE DETECTED  THCU NONE DETECTED   LABBARB NONE DETECTED    Alcohol Level No results for input(s): ETH in the last 168 hours.  IMAGING past 24 hours MR BRAIN WO CONTRAST  Result Date: 06/22/2021 CLINICAL DATA:  Initial evaluation for neuro deficit, stroke suspected, mental status change. EXAM: MRI HEAD WITHOUT CONTRAST TECHNIQUE: Multiplanar, multiecho pulse sequences of the brain and surrounding structures were obtained without intravenous contrast. COMPARISON:  Prior CT from earlier the same day as well as recent brain MRI from 06/17/2021. FINDINGS: Brain: Age-related cerebral atrophy with fairly advanced chronic microvascular ischemic disease. Multiple remote lacunar infarcts about the hemispheric cerebral white matter and deep gray nuclei again noted, stable. Evolving subacute infarct involving the posterior left frontoparietal region again noted, stable. Additional evolving subacute infarcts about the splenium are also unchanged. No evidence for hemorrhagic transformation or significant mass effect. Subtle 5 mm focus of diffusion abnormality seen involving the subcortical white matter of the left parietooccipital region (series 9, image 90). Few additional punctate ischemic infarcts noted involving the bilateral hippocampi (series 9, image  78 on the left, series 9, image 79 on the right). These are new from previous, inter consistent with new acute ischemic infarcts. No associated hemorrhage or mass effect. No acute intracranial hemorrhage elsewhere within the brain. Chronic hemorrhage involving the left anterior genu of the corpus callosum noted, stable. Additional punctate chronic microhemorrhage noted at the posterior limb of the left internal capsule, also unchanged. No mass lesion, midline shift or mass effect. No hydrocephalus. Benign arachnoid cyst at the right middle cranial fossa noted. No other extra-axial fluid collection. Pituitary gland and suprasellar region within normal limits. Vascular: Major intracranial vascular flow  voids are maintained. Skull and upper cervical spine: Craniocervical junction within normal limits. Bone marrow signal intensity normal. Degenerative spondylosis noted at C3-4 with resultant mild spinal stenosis. Remainder the visualized upper cervical spine otherwise unremarkable. No scalp soft tissue abnormality. Sinuses/Orbits: Globes orbital soft tissues demonstrate no acute finding. Mild scattered mucosal thickening noted within the ethmoidal air cells. Paranasal sinuses are otherwise largely clear. No mastoid effusion. Inner ear structures grossly normal. Other: None. IMPRESSION: 1. Three total new punctate ischemic infarcts involving the subcortical left parietooccipital region and bilateral hippocampi as above. No associated hemorrhage or mass effect. 2. No significant interval change in evolving subacute infarctions involving the left frontoparietal region and splenium. 3. Age-related cerebral atrophy with advanced chronic microvascular ischemic disease, with multiple remote lacunar infarcts involving the hemispheric cerebral white matter and deep gray nuclei. Electronically Signed   By: Rise Mu M.D.   On: 06/22/2021 02:12    PHYSICAL EXAM   Temp:  [97.5 F (36.4 C)-98.1 F (36.7 C)] 98.1 F (36.7 C) (10/27 2116) Pulse Rate:  [76-96] 96 (10/27 1705) Resp:  [20] 20 (10/27 1705) BP: (162-186)/(81-86) 172/83 (10/27 1705) SpO2:  [96 %-99 %] 96 % (10/27 1705)  General - Well nourished, well developed, in no apparent distress.  Ophthalmologic - fundi not visualized due to noncooperation.  Cardiovascular - Regular rhythm and rate.  Neuro - awake, alert, eyes open, orientated to age, place, time and people. No aphasia, fluent language, following all simple commands. Able to name and repeat and read. No gaze palsy, tracking bilaterally, visual field full, PERRL. Left mild facial droop. Tongue midline. LUE 4/5 and LLE 3/5, however. RUE flaccid with pain on shoulder passive movement.  RLE 1/5 with painful stimulation. Sensation symmetrical bilaterally subjectively, left FTN intact grossly, gait not tested.     ASSESSMENT/PLAN [name] is a 65 y.o. female with history of hypertension, hyperlipidemia, type 2 diabetes, CKD stage IIIa, left carotid stenosis status post left transthoracic carotid artery revascularization, CVA in August 2022 who presented to the ED with generalized weakness.  Stroke - subacute new bilateral corpus callosal ischemic strokes, likely due to b/l PCA known high grade stenosis Stroke - punctate b/l hippocampi and left parietooccipital, consistent with b/l high grade PCA stenosis in the setting of hypotension CT head No acute abnormality. Expected evolution of previously demonstrated infarct in the posterior left frontal lobe.  MRI  Late subacute L parietal and cortical infarctions without extension. 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  CTA head and neck x 2 - severe stenosis bilateral P2 and bilateral MCA branch vessels. MRI brain 10/26 - Three total new punctate ischemic infarcts involving the subcortical left parietooccipital region and bilateral hippocampi  2D Echo EF 25-30%. Severe HK. LV apical embolus, however, later this was corrected to EF 40-45%  without LV thrombus UDS negative  LDL 133 HgbA1c 5.7 VTE prophylaxis - IV heparin aspirin 81 mg daily and clopidogrel 75 mg daily prior to admission, now on ASA 81 and brilinta 90 twice daily for 1 month and then aspirin 325 and Plavix 75 for additional 2 months and then Plavix alone given severe intracranial stenosis. Therapy recommendations:  CIR Disposition:  pending   Hx of stroke 03/2021 admitted for right-sided weakness.  CT no acute abnormality.  CTA head and neck showed 75% stenosis of proximal left ICA.  Bilateral PCA high-grade stenosis.  MRI showed small acute infarct at the left frontoparietal  junction.  Additional small infarct adjacent to the frontal horn of the left lateral ventricle.  Status post left TCAR 04/06/2021.  Postprocedure increase right leg weakness.  CT head and neck unchanged except interval left ICA stent, patent with 45% stenosis.  MRI on 8/12 showed interval enlargement/expansion of the previous left frontoparietal infarct, likely due to periprocedural fluctuating BP.  LDL 78, A1c 6.5.  Discharged with aspirin 325 and Plavix 75 for 3 months and then Plavix alone. Admitted in 02/2021 for blurry vision and hypertension, MRI showed left thalamic small infarcts.  CTA head and neck showed left ICA 75% stenosis.  MRA head showed severe distal right M2, and right P2 stenosis.  A1c 6.4, LDL 184.  EF 55 to 60%, creatinine 1.10.  She was discharged with aspirin 81 Plavix 75 DAPT for 3 weeks and then Plavix alone, as well as Lipitor 80. Admitted 02/2018 with left thalamic/internal capsule infarct.  EF 60 to 65%, LDL 130, A1c 5.8.  CT head and neck at that time left ICA 80% stenosis.  Referred her to vascular surgery and was planning to do left CEA, however patient canceled the procedure herself.  She then followed with her PCP and referred to see a neurologist Dr. Charyl Dancer in [place], had carotid Doppler on 02/20/2021 which reported as unremarkable.   Carotid artery stenosis CTA head and neck 75% stenosis of the proximal ICA on the left. Vascular surgery, Dr. Lenell Antu, s/p TCAR 8/11. Post procedure concerning for increased right LE weakness. However, the stat CT already showed expansion of left paracentral gyrus infarct and comparable with the stroke size of MRI, concerning for stroke expansion prior to procedure due to fluctuating BP.  Hypertension hypotension Home meds: amlodipine 10 mg, lisinopril 10 mg, HCTZ 12. Seems to be noncomplaint with medications Low BP down to 80s on 10/26 Avoid low BP Long-term BP goal 130-150 given severe intracranial stenosis  Hyperlipidemia Home meds:   atorvastatin 80mg , resumed in hospital LDL 133, goal < 70 Noncompliant Now on lipitor 80 Continue statin at discharge  Diabetes type II Controlled Home meds:  glimepiride HgbA1c 5.7, goal < 7.0 CBG SSI Close PCP follow-up  COVID  COVID 19 PCR positive on isolation Asymptomatic Management per primary team  Other Stroke Risk Factors Advanced Age >/= 44   Other Active Problems CKD stage 3  Hypokalemia, resolved  Hospital day # 5  I spent  35 minutes in total face-to-face time with the patient, more than 50% of which was spent in counseling and coordination of care, reviewing test results, images and medication, and discussing the diagnosis, treatment plan and potential prognosis. This patient's care requiresreview of multiple databases, neurological assessment, discussion with family, other specialists and medical decision making of high complexity.  Neurology will sign off. Please call with questions. Pt will follow up with stroke clinic Dr. [id number] at [facility] in about  4 weeks. Thanks for the consult.    Marvel Plan, MD PhD Stroke Neurology 06/22/2021 9:36 PM   To contact Stroke Continuity provider, please refer to [email or web address]. After hours, contact General Neurology

## 2021-06-22 NOTE — Progress Notes (Signed)
Jade [name] is 65 y.o. with pertinent PMH HTN, HLD, T2DM, former smoker ,multiple CVA with recent CVA in August , and left carotid artery stenosis s/p left transcarotid artery revascularization (04/06/2021) ,who presented with weakness and admitted for subacute stroke.  Subjective:  Overnight: NAEON  Patient denied any acute concerns. Stated she did not sleep well due to all the noise. She denies any new symptoms since yesterday.    Objective:  Vital signs in last 24 hours: Vitals:   06/18/21 0041 06/18/21 0042 06/18/21 0423 06/18/21 0739  BP: (!) 176/75  (!) 186/82 (!) 168/79  Pulse: 70  72 66  Resp: 18  20 15   Temp: 98.8 F (37.1 C)  97.8 F (36.6 C) 98.1 F (36.7 C)  TempSrc: Oral   Oral  SpO2: 98%  96% 98%  Weight:  72.6 kg    Height:       Supplemental O2: Room Air SpO2: 98 %  Physical Exam General: NAD Head: Normocephalic without scalp lesions.  Eyes: Conjunctivae pink, sclerae white, without jaundice. PERL. Mouth: Lips normal color, without lesions. Moist mucus membrane. Neck: Neck supple with full range of motion (ROM).  Lungs: CTAB, no wheeze, rhonchi or rales.  Cardiovascular: Normal heart sounds Abdomen: No TTP, normal bowel sounds MSK: No asymmetry or muscle atrophy. No injuries noted.  Skin: no lesions Neuro: Alert and oriented. CNII-XII intact, weakness in right shoulder shrug still present. 2/5 RUE, weak grip, 3/5 RLE. Good left side grip, 4/5 LUE, 4/5 LLE.  Psych: Normal mood and normal affect    CBC Latest Ref Rng & Units 06/18/2021 06/17/2021 06/14/2021  WBC 4.0 - 10.5 K/uL 5.2 6.1 8.7  Hemoglobin 12.0 - 15.0 g/dL 06/16/2021 73.7 10.6  Hematocrit 36.0 - 46.0 % 43.7 45.3 44.0  Platelets 150 - 400 K/uL 284 304 370    CMP Latest Ref Rng & Units 06/18/2021 06/17/2021 06/14/2021  Glucose 70 - 99 mg/dL 95 06/16/2021) 485(I)  BUN 8 - 23 mg/dL 19 18 20   Creatinine 0.44 - 1.00 mg/dL 627(O) ) 3.50(K  Sodium 135 - 145 mmol/L 137 136 142  Potassium 3.5 -  5.1 mmol/L 3.4(L) 3.6 3.6  Chloride 98 - 111 mmol/L 102 101 105  CO2 22 - 32 mmol/L 24 21(L) 26  Calcium 8.9 - 10.3 mg/dL 9.1 9.3 9.38(H  Total Protein 6.5 - 8.1 g/dL - - -  Total Bilirubin 0.3 - 1.2 mg/dL - - -  Alkaline Phos 38 - 126 U/L - - -  AST 15 - 41 U/L - - -  ALT 0 - 44 U/L - - -  Lipid Panel     Component Value Date/Time   CHOL 201 (H) 06/18/2021 0231   TRIG 140 06/18/2021 0231   HDL 40 (L) 06/18/2021 0231   CHOLHDL 5.0 06/18/2021 0231   VLDL 28 06/18/2021 0231   LDLCALC 133 (H) 06/18/2021 0231   LDLDIRECT 164.7 08/24/2013 0848     Filed Weights   06/17/21 1333 06/18/21 0042  Weight: 77.1 kg 72.6 kg    No intake or output data in the 24 hours ending 06/18/21 0939 Net IO Since Admission: No IO data has been entered for this period [06/18/21 0939]  Echo 06/21/21:  1. Definity contrast shows early fibrinous thrombus formation in the  apex. Left ventricular ejection fraction, by estimation, is 25 to 30%. The  left ventricle has severely decreased function. The left ventricle  demonstrates regional wall motion abnormalities (see scoring  diagram/findings for description).  The left  ventricular internal cavity size was mildly dilated. Left ventricular  diastolic parameters are consistent with Grade III diastolic dysfunction  (restrictive). There is severe akinesis of the left ventricular, entire  anteroseptal wall and apical segment.   2. Right ventricular systolic function is normal. The right ventricular  size is normal.   3. The mitral valve is grossly normal. No evidence of mitral valve  regurgitation.   4. The aortic valve is grossly normal. Aortic valve regurgitation is not  visualized.   CTA Head Neck W WO Contrast IMPRESSION: 1. No emergent finding or change from 3 days ago. 2. Advanced intracranial atherosclerosis, especially bilateral proximal PCA and M2 segments. 3. Patent left carotid stent.   MRI WO Contrast 06/22/21 1. Three total new punctate  ischemic infarcts involving the subcortical left parietooccipital region and bilateral hippocampi as above. No associated hemorrhage or mass effect. 2. No significant interval change in evolving subacute infarctions involving the left frontoparietal region and splenium. 3. Age-related cerebral atrophy with advanced chronic microvascular ischemic disease, with multiple remote lacunar infarcts involving the hemispheric cerebral white matter and deep gray nuclei.  Assessment/Plan: Jade [name] is 65 y.o. with pertinent PMH HTN, HLD, T2DM, former smoker ,multiple CVA with recent CVA in August , and left carotid artery stenosis s/p left transcarotid artery revascularization (04/06/2021) ,who presented with weakness and admitted for subacute stroke.   LV Thrombus Subacute CVA Weakness Patient presented with increased weakness and found to have subacute CVA. Most likely in the setting of non-compliance as patient states she was out of all her medications for a while and upon further clarifications stated about 2-3 weeks. Echo showed the above findings with decreased EF and a LV thrombus. Started on heparin gtt for LV thrombus. Cardiology consulted; they do not believe it is a thrombus and her EF is estimated to be at 45%. Heparin gtt stopped. After BP regimen change due to significant hypertension, patient was hypotensive on 10/26 am with less responsiveness, a code stroke was called but CTA was negative for acute findings. MRI later in the evening showed three total new punctate ischemic infarcts involving the subcortical left parietooccipital region and bilateral hippocampi. No associated hemorrhage or mass effect seen. No neuro deficit seen during physical exam.  -Neurology still following, appreciate rec's - Continue to control modifiable risk factors: HTN, HLD, T2DM - Cardiology recommends low dose Coreg and losartan 12.5 mg daily as BP meds. Will await Neurology recs.  - Continue Atorvastatin  80 mg - BP goal: Permissive HTN for goal of systolic BP>100. Treat if >220/110; will restart slowly and titrate up her antihypertensive regimen.  - Telemetry monitoring - Social work able to place patient in inpatient rehab with asymptomatic COVID when medically clear at [place].   Hypokalemia (resolved) Patient's K is 3.9 on 06/22/21. Magnesium wnl.  -Replete for a goal of 4. -Repeat BMP in the afternoon.   Covid-19  - Asymptomatic , positive on screening test. Has not received Covid Vaccine.  -Continue to monitor. If patient develops hypoxia will consider initiation of steroids. Not symptomatic 06/22/21.  Hypertension -Home medications amlodipine 10 mg, lisinopril 10 mg, HCTZ 12.5. Patient placed on a new regimen on 06/20/21 after having significant hypertension and had hypotensive episode this morning.  -Permissive HTN <220/110 for 24 hours then slowly start BP meds. Goal to keep sys>100; will restart slowly and titrate up her antihypertensive regimen. None started today 06/22/21.   Hyperlipidemia - Chronic lipid panel showing cholesterol 201, and  LDL 133. - Continue Atorvastatin 80 mg   CKD Stage 3a (stable) -Baseline creatinine 1.1-1.3. Creatinine 1.20 on 06/21/21. - Continue to monitor   Type 2 Diabetes Mellitus  - Hemoglobin A1c 6.5 03/2021, home medication glimepiride. HbA1c 5.7 06/18/21. CBGs mostly <180. - SSI-S, Will add basal if increase in SSI usage.   Diarrhea Chronic. States bowel movement soon after eating.  -Will continue imodium daily. Recommend OP workup.  -Continue to monitor.   Diet: Carb-Modified VTE: Enoxaparin IVF: None,None Code: DNR   Prior to Admission Living Arrangement: Home, living with assistance from son Anticipated Discharge Location: CIR per PT/OT recommendations Barriers to Discharge: Workup as above, placement   Dispo: Admit patient to Inpatient with expected length of stay greater than 2 midnights. Gwenevere Abbot, MD Eligha Bridegroom. [facility] Internal Medicine Residency, PGY-1  Pager: [phone] After 5 pm and on weekends: Please call the on-call pager

## 2021-06-23 DIAGNOSIS — E7800 Pure hypercholesterolemia, unspecified: Secondary | ICD-10-CM | POA: Diagnosis not present

## 2021-06-23 DIAGNOSIS — I6330 Cerebral infarction due to thrombosis of unspecified cerebral artery: Secondary | ICD-10-CM | POA: Diagnosis not present

## 2021-06-23 DIAGNOSIS — E1149 Type 2 diabetes mellitus with other diabetic neurological complication: Secondary | ICD-10-CM | POA: Diagnosis not present

## 2021-06-23 DIAGNOSIS — I10 Essential (primary) hypertension: Secondary | ICD-10-CM | POA: Diagnosis not present

## 2021-06-23 DIAGNOSIS — I519 Heart disease, unspecified: Secondary | ICD-10-CM | POA: Diagnosis not present

## 2021-06-23 LAB — GLUCOSE, CAPILLARY
Glucose-Capillary: 114 mg/dL — ABNORMAL HIGH (ref 70–99)
Glucose-Capillary: 121 mg/dL — ABNORMAL HIGH (ref 70–99)
Glucose-Capillary: 120 mg/dL — ABNORMAL HIGH (ref 70–99)
Glucose-Capillary: 227 mg/dL — ABNORMAL HIGH (ref 70–99)

## 2021-06-23 LAB — MAGNESIUM: Magnesium: 1.8 mg/dL (ref 1.7–2.4)

## 2021-06-23 LAB — CBC
HCT: 39 % (ref 36.0–46.0)
Hemoglobin: 12.9 g/dL (ref 12.0–15.0)
MCH: 31.9 pg (ref 26.0–34.0)
MCHC: 33.1 g/dL (ref 30.0–36.0)
MCV: 96.5 fL (ref 80.0–100.0)
Platelets: 266 10*3/uL (ref 150–400)
RBC: 4.04 MIL/uL (ref 3.87–5.11)
RDW: 12.9 % (ref 11.5–15.5)
WBC: 8.4 10*3/uL (ref 4.0–10.5)
nRBC: 0 % (ref 0.0–0.2)

## 2021-06-23 LAB — BASIC METABOLIC PANEL
Anion gap: 9 (ref 5–15)
BUN: 23 mg/dL (ref 8–23)
CO2: 20 mmol/L — ABNORMAL LOW (ref 22–32)
Calcium: 8.8 mg/dL — ABNORMAL LOW (ref 8.9–10.3)
Chloride: 106 mmol/L (ref 98–111)
Creatinine, Ser: 1.13 mg/dL — ABNORMAL HIGH (ref 0.44–1.00)
GFR, Estimated: 54 mL/min — ABNORMAL LOW (ref >60)
Glucose, Bld: 115 mg/dL — ABNORMAL HIGH (ref 70–99)
Potassium: 3.6 mmol/L (ref 3.5–5.1)
Sodium: 135 mmol/L (ref 135–145)

## 2021-06-23 MED ORDER — CARVEDILOL 6.25 MG PO TABS
6.2500 mg | ORAL_TABLET | Freq: Two times a day (BID) | ORAL | Status: DC
  Administered 2021-06-23 – 2021-06-30 (×15): 6.25 mg via ORAL
  Filled 2021-06-23 (×16): qty 1

## 2021-06-23 MED ORDER — LOSARTAN POTASSIUM 25 MG PO TABS: 12.5000 mg | ORAL_TABLET | Freq: Every day | ORAL | Status: DC

## 2021-06-23 MED ORDER — LOSARTAN POTASSIUM 25 MG PO TABS
12.5000 mg | ORAL_TABLET | Freq: Every day | ORAL | Status: DC
  Administered 2021-06-24 – 2021-06-30 (×7): 12.5 mg via ORAL
  Filled 2021-06-23 (×7): qty 1

## 2021-06-23 NOTE — Progress Notes (Signed)
Physical Therapy Treatment Patient Details Name: Jade [name] MRN: [id number] DOB: 1955/12/23 Today's Date: 06/23/2021   History of Present Illness 65 y.o. female admitted 10/22 with increased bil weakness. 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  PMH: arthritis, CKD, COPD, CVA Aug 2022 with revascularization, fibromyalgia, gout, HTN, migraine, OSA, DM2    PT Comments    Pt reports continued struggle with sleeping and back spasms. Pt with massage provided with pt stating some relief. Pt able to progress with bed mobility with still max assist but decreased physical assist from last session. Pt tolerating standing in stedy but fatigues quickly and educated for HEP. RN present during transfer with education for return transfer to bed via stedy. Will continue to follow.     Recommendations for follow up therapy are one component of a multi-disciplinary discharge planning process, led by the attending physician.  Recommendations may be updated based on patient status, additional functional criteria and insurance authorization.  Follow Up Recommendations  Acute inpatient rehab (3hours/day)     Assistance Recommended at Discharge Intermittent Supervision/Assistance  Equipment Recommendations  Other (comment) (hoyer lift, home stedy would be ideal)    Recommendations for Other Services       Precautions / Restrictions Precautions Precautions: Fall;Other (comment) Precaution Comments: Hx of CVA with R hemiparesis     Mobility  Bed Mobility Overal bed mobility: Needs Assistance Bed Mobility: Supine to Sit     Supine to sit: Max assist     General bed mobility comments: assist for pivot to right side of bed with assist to advance legs and pivot pelvis with pt able to use LUE to pull up with trunk. initial minguard for sitting balance with regression to min assist with  right lean    Transfers Overall transfer level: Needs assistance   Transfers: Sit to/from Stand Sit to Stand: Min assist;From elevated surface           General transfer comment: min assist to rise from elevated surface with use of stedy. assist for anterior translation and full rise from bed and stedy pad. Pt able to stand 55 sec with min assist for blance with mod cues for weight shift left and trunk/hip extension. pt fatigued after 2 standing trials with pivot bed to chair via stedy Transfer via Lift Equipment: Stedy  Ambulation/Gait             General Gait Details: unable at baseline   Stairs             Wheelchair Mobility    Modified Rankin (Stroke Patients Only) Modified Rankin (Stroke Patients Only) Pre-Morbid Rankin Score: Severe disability Modified Rankin: Severe disability     Balance Overall balance assessment: Needs assistance Sitting-balance support: Single extremity supported;Feet unsupported;Feet supported Sitting balance-Leahy Scale: Poor Sitting balance - Comments: minguard with regression to min assist sitting EOB with right lean Postural control: Posterior lean;Right lateral lean Standing balance support: Single extremity supported Standing balance-Leahy Scale: Poor Standing balance comment: external support and LUE support on stedy                            Cognition Arousal/Alertness: Awake/alert Behavior During Therapy: Flat affect Overall Cognitive Status: Impaired/Different from baseline                   Orientation Level: Disoriented to;Time Current Attention Level:  Sustained   Following Commands: Follows one step commands with increased time     Problem Solving: Slow processing          Exercises General Exercises - Lower Extremity Short Arc Quad: AAROM;Right;Seated;10 reps Long Arc Quad: AROM;Left;Seated;20 reps Hip Flexion/Marching: AROM;Left;Seated;10 reps    General Comments         Pertinent Vitals/Pain Pain Score: 4  Pain Location: RUE with movement, no hip pain this session Pain Descriptors / Indicators: Grimacing;Discomfort Pain Intervention(s): Limited activity within patient's tolerance;Repositioned;Monitored during session    Home Living                          Prior Function            PT Goals (current goals can now be found in the care plan section) Progress towards PT goals: Progressing toward goals    Frequency    Min 3X/week      PT Plan Current plan remains appropriate    Co-evaluation              AM-PAC PT "6 Clicks" Mobility   Outcome Measure  Help needed turning from your back to your side while in a flat bed without using bedrails?: A Lot Help needed moving from lying on your back to sitting on the side of a flat bed without using bedrails?: A Lot Help needed moving to and from a bed to a chair (including a wheelchair)?: Total Help needed standing up from a chair using your arms (e.g., wheelchair or bedside chair)?: Total Help needed to walk in hospital room?: Total Help needed climbing 3-5 steps with a railing? : Total 6 Click Score: 8    End of Session Equipment Utilized During Treatment: Gait belt Activity Tolerance: Patient tolerated treatment well Patient left: in chair;with call bell/phone within reach;with chair alarm set;with nursing/sitter in room Nurse Communication: Mobility status;Need for lift equipment (stedy with +2 assist for transfer chair to bed) PT Visit Diagnosis: Muscle weakness (generalized) (M62.81);Unsteadiness on feet (R26.81);Difficulty in walking, not elsewhere classified (R26.2)     Time: 1658-0063 PT Time Calculation (min) (ACUTE ONLY): 27 min  Charges:  $Therapeutic Exercise: 8-22 mins $Therapeutic Activity: 8-22 mins                     Nephi Savage P, PT Acute Rehabilitation Services Pager: [phone] Office: [phone]    Shalena Ezzell B Khara Renaud 06/23/2021, 1:28 PM

## 2021-06-23 NOTE — Progress Notes (Signed)
Jade [name] is 65 y.o. with pertinent PMH HTN, HLD, T2DM, former smoker ,multiple CVA with recent CVA in August , and left carotid artery stenosis s/p left transcarotid artery revascularization (04/06/2021) ,who presented with weakness and admitted for subacute stroke.  Subjective:  Overnight: NAEON  Left sided hip spasm this morning, she states it has been there since admission. States is is not present right now. No acute concerns t this time. Patient states she is willing to go to CIR at [facility]   Objective:  Vital signs in last 24 hours: Vitals:   06/23/21 0600 06/23/21 0734 06/23/21 0812 06/23/21 1156  BP: (!) 175/99  (!) 159/72 (!) 167/76  Pulse:      Resp:   12 17  Temp:  97.7 F (36.5 C) 97.8 F (36.6 C) 98.2 F (36.8 C)  TempSrc:  Oral Oral Oral  SpO2:   97% 98%  Weight:      Height:       Supplemental O2: Room Air SpO2: 98 % Physical Exam Physical Exam General: NAD.  Eyes: Conjunctivae pink, sclerae white, without jaundice. Neck: Neck supple with full range of motion (ROM).  Lungs: CTAB, no wheeze, rhonchi or rales.  Cardiovascular: Normal heart sounds Abdomen: No TTP, normal bowel sounds MSK: No asymmetry or muscle atrophy. No injuries noted.  Skin: no lesions Neuro: Alert and oriented. CN grossly intact. 2/5 RUE, weak grip, 3/5 RLE. Good left side grip, 4/5 LUE, 4/5 LLE.  Psych: Normal mood and normal affect   CBC Latest Ref Rng & Units 06/23/2021 06/22/2021 06/21/2021  WBC 4.0 - 10.5 K/uL 8.4 7.7 6.9  Hemoglobin 12.0 - 15.0 g/dL 52.8 41.3 24.4  Hematocrit 36.0 - 46.0 % 39.0 38.0 41.9  Platelets 150 - 400 K/uL 266 257 281    CMP Latest Ref Rng & Units 06/23/2021 06/22/2021 06/21/2021  Glucose 70 - 99 mg/dL 010(U) 725(D) 664(Q)  BUN 8 - 23 mg/dL 23 03(K) 74(Q)  Creatinine 0.44 - 1.00 mg/dL 5.95(G) 3.87(F) 6.43(P)  Sodium 135 - 145 mmol/L 135 136 138  Potassium 3.5 - 5.1 mmol/L 3.6 3.9 4.9  Chloride 98 - 111 mmol/L 106 107 107  CO2 22 - 32 mmol/L  20(L) 22 21(L)  Calcium 8.9 - 10.3 mg/dL 2.9(J) 9.0 9.4  Total Protein 6.5 - 8.1 g/dL - - -  Total Bilirubin 0.3 - 1.2 mg/dL - - -  Alkaline Phos 38 - 126 U/L - - -  AST 15 - 41 U/L - - -  ALT 0 - 44 U/L - - -  Lipid Panel     Component Value Date/Time   CHOL 201 (H) 06/18/2021 0231   TRIG 140 06/18/2021 0231   HDL 40 (L) 06/18/2021 0231   CHOLHDL 5.0 06/18/2021 0231   VLDL 28 06/18/2021 0231   LDLCALC 133 (H) 06/18/2021 0231   LDLDIRECT 164.7 08/24/2013 0848    Filed Weights   06/17/21 1333 06/18/21 0042  Weight: 77.1 kg 72.6 kg    No intake or output data in the 24 hours ending 06/23/21 1426 Net IO Since Admission: -730.17 mL [06/23/21 1426]  No new imaging  Assessment/Plan: Jade [name] is 65 y.o. with pertinent PMH HTN, HLD, T2DM, former smoker ,multiple CVA with recent CVA in August , and left carotid artery stenosis s/p left transcarotid artery revascularization (04/06/2021) ,who presented with weakness and admitted for subacute stroke.    Subacute CVA Weakness Patient doing well. Weakness still same as initial presentation. Neuro signed  off recommend sys 130-150. Cardiology  started low dose Coreg 3.125 mg.  Will titrate up her regimen slowly to prevent hypotension.  - Continue to control modifiable risk factors: HTN, HLD, T2DM -Telemetry monitoring - Social work working on discharge.   Hypokalemia (resolved) Patient's K is 3.9 on 06/22/21. Magnesium wnl.  -Replete for a goal of 4. -Repeat BMP in the afternoon.   Covid-19  - Asymptomatic , positive on screening test. Has not received Covid Vaccine.  -Continue to monitor. If patient develops hypoxia will consider initiation of steroids. Not symptomatic 06/22/21.  Hypertension -Home medications amlodipine 10 mg, lisinopril 10 mg, HCTZ 12.5. Patient placed on a new regimen on 06/20/21 after having significant hypertension and had hypotensive episode on 06/21/21.  -Neuro recommends sys 130-150.  -Cardiology   started low dose Coreg 3.125 mg.  Will titrate up her regimen slowly to prevent hypotension.    Hyperlipidemia - Continue Atorvastatin 80 mg   AKI (resolved) CKD Stage 3a (stable) -Baseline creatinine 1.1-1.3. Creatinine 1.33 on 06/23/21. From 1.65 on 06/21/21. - Continue to monitor   Type 2 Diabetes Mellitus  - Hemoglobin A1c 6.5 03/2021, home medication glimepiride. HbA1c 5.7 06/18/21. CBGs mostly <150. - SSI-S, Will add basal if increase in SSI usage.   Diarrhea Chronic. States bowel movement soon after eating.  -Will continue imodium daily. Recommend OP workup.  -Continue to monitor.   Diet: Carb-Modified VTE: Enoxaparin IVF: None,None Code: DNR   Prior to Admission Living Arrangement: Home, living with assistance from son Anticipated Discharge Location: Inpatient Rehab per PT/OT recommendations Barriers to Discharge: Workup as above, placement   Dispo: Admit patient to Inpatient with expected length of stay greater than 2 midnights. Gwenevere Abbot, MD Eligha Bridegroom. [facility] Medicine Res[facility], PGY-1  Pager: [phone] After 5 pm and on weekends: Please call the on-call pager

## 2021-06-23 NOTE — TOC Progression Note (Signed)
Transition of Care [facility]) - Progression Note    Patient Details  Name: Jade [name] MRN: [id number] Date of Birth: 05-10-1956  Transition of Care [facility]) CM/SW Contact  Lorri Frederick, LCSW Phone Number: 06/23/2021, 1:30 PM  Clinical Narrative:  CSW spoke with pt regarding medical clearance and potential admission to [facility] CIR, explained available options need to be pursued.  Pt agreeable at this time to potential admission at [facility].   CSW spoke with Brayton Caves at [facility] who needs updated clinicals and can move forward with insurance authorization.      Expected Discharge Plan: IP Rehab Facility Barriers to Discharge: Continued Medical Work up  Expected Discharge Plan and Services Expected Discharge Plan: IP Rehab Facility In-house Referral: Clinical Social Work   Post Acute Care Choice: IP Rehab Living arrangements for the past 2 months: Single Family Home                                       Social Determinants of Health (SDOH) Interventions    Readmission Risk Interventions No flowsheet data found.

## 2021-06-23 NOTE — Progress Notes (Signed)
Progress Note  Patient Name: Jade [name] Date of Encounter: 06/23/2021  [facility] Cardiologist: Thurmon Fair, MD   Subjective   No dyspnea, angina or new neuro events  Inpatient Medications    Scheduled Meds:  aspirin EC  81 mg Oral Daily   atorvastatin  80 mg Oral Daily   carvedilol  6.25 mg Oral BID WC   dantrolene  50 mg Oral QHS   enoxaparin (LOVENOX) injection  40 mg Subcutaneous Q24H   insulin aspart  0-5 Units Subcutaneous QHS   insulin aspart  0-9 Units Subcutaneous TID WC   tamsulosin  0.4 mg Oral QPC supper   ticagrelor  90 mg Oral BID   Continuous Infusions:  PRN Meds: acetaminophen **OR** acetaminophen, loperamide, ondansetron **OR** ondansetron (ZOFRAN) IV, senna-docusate   Vital Signs    Vitals:   06/23/21 0435 06/23/21 0600 06/23/21 0734 06/23/21 0812  BP: (!) 114/96 (!) 175/99  (!) 159/72  Pulse: 78     Resp: (!) 21   12  Temp: 98.4 F (36.9 C)  97.7 F (36.5 C) 97.8 F (36.6 C)  TempSrc: Oral  Oral Oral  SpO2: 95%   97%  Weight:      Height:        Intake/Output Summary (Last 24 hours) at 06/23/2021 0906 Last data filed at 06/22/2021 1230 Gross per 24 hour  Intake 240 ml  Output --  Net 240 ml   Last 3 Weights 06/18/2021 06/17/2021 06/14/2021  Weight (lbs) 160 lb 0.9 oz 170 lb 167 lb 15.9 oz  Weight (kg) 72.6 kg 77.111 kg 76.2 kg      Telemetry    NSR, rare 7-15 beats regular SVT, probably ectopic atrial tachycardia - Personally Reviewed  ECG    No new tracing - Personally Reviewed  Physical Exam  Frail, weak GEN: No acute distress.   Neck: No JVD Cardiac: RRR, no murmurs, rubs, or gallops.  Respiratory: Clear to auscultation bilaterally. GI: Soft, nontender, non-distended  MS: No edema; No deformity. Neuro:  RUE hemiparesis Psych: Normal affect   Labs    High Sensitivity Troponin:  No results for input(s): TROPONINIHS in the last 720 hours.   Chemistry Recent Labs  Lab 06/18/21 0231 06/19/21 0216  06/21/21 1544 06/22/21 0219 06/23/21 0218  NA 137   < > 138 136 135  K 3.4*   < > 4.9 3.9 3.6  CL 102   < > 107 107 106  CO2 24   < > 21* 22 20*  GLUCOSE 95   < > 164* 125* 115*  BUN 19   < > 27* 28* 23  CREATININE 1.01*   < > 1.65* 1.55* 1.13*  CALCIUM 9.1   < > 9.4 9.0 8.8*  MG 2.0  --   --   --  1.8  GFRNONAA >60   < > 34* 37* 54*  ANIONGAP 11   < > 10 7 9    < > = values in this interval not displayed.    Lipids  Recent Labs  Lab 06/18/21 0231  CHOL 201*  TRIG 140  HDL 40*  LDLCALC 133*  CHOLHDL 5.0    Hematology Recent Labs  Lab 06/21/21 0322 06/22/21 0219 06/23/21 0218  WBC 6.9 7.7 8.4  RBC 4.38 3.94 4.04  HGB 14.1 12.3 12.9  HCT 41.9 38.0 39.0  MCV 95.7 96.4 96.5  MCH 32.2 31.2 31.9  MCHC 33.7 32.4 33.1  RDW 13.0 13.1 12.9  PLT 281  257 266   Thyroid  Recent Labs  Lab 06/18/21 0231  TSH 1.909    BNPNo results for input(s): BNP, PROBNP in the last 168 hours.  DDimer No results for input(s): DDIMER in the last 168 hours.   Radiology    MR BRAIN WO CONTRAST  Result Date: 06/22/2021 CLINICAL DATA:  Initial evaluation for neuro deficit, stroke suspected, mental status change. EXAM: MRI HEAD WITHOUT CONTRAST TECHNIQUE: Multiplanar, multiecho pulse sequences of the brain and surrounding structures were obtained without intravenous contrast. COMPARISON:  Prior CT from earlier the same day as well as recent brain MRI from 06/17/2021. FINDINGS: Brain: Age-related cerebral atrophy with fairly advanced chronic microvascular ischemic disease. Multiple remote lacunar infarcts about the hemispheric cerebral white matter and deep gray nuclei again noted, stable. Evolving subacute infarct involving the posterior left frontoparietal region again noted, stable. Additional evolving subacute infarcts about the splenium are also unchanged. No evidence for hemorrhagic transformation or significant mass effect. Subtle 5 mm focus of diffusion abnormality seen involving the  subcortical white matter of the left parietooccipital region (series 9, image 90). Few additional punctate ischemic infarcts noted involving the bilateral hippocampi (series 9, image 78 on the left, series 9, image 79 on the right). These are new from previous, inter consistent with new acute ischemic infarcts. No associated hemorrhage or mass effect. No acute intracranial hemorrhage elsewhere within the brain. Chronic hemorrhage involving the left anterior genu of the corpus callosum noted, stable. Additional punctate chronic microhemorrhage noted at the posterior limb of the left internal capsule, also unchanged. No mass lesion, midline shift or mass effect. No hydrocephalus. Benign arachnoid cyst at the right middle cranial fossa noted. No other extra-axial fluid collection. Pituitary gland and suprasellar region within normal limits. Vascular: Major intracranial vascular flow voids are maintained. Skull and upper cervical spine: Craniocervical junction within normal limits. Bone marrow signal intensity normal. Degenerative spondylosis noted at C3-4 with resultant mild spinal stenosis. Remainder the visualized upper cervical spine otherwise unremarkable. No scalp soft tissue abnormality. Sinuses/Orbits: Globes orbital soft tissues demonstrate no acute finding. Mild scattered mucosal thickening noted within the ethmoidal air cells. Paranasal sinuses are otherwise largely clear. No mastoid effusion. Inner ear structures grossly normal. Other: None. IMPRESSION: 1. Three total new punctate ischemic infarcts involving the subcortical left parietooccipital region and bilateral hippocampi as above. No associated hemorrhage or mass effect. 2. No significant interval change in evolving subacute infarctions involving the left frontoparietal region and splenium. 3. Age-related cerebral atrophy with advanced chronic microvascular ischemic disease, with multiple remote lacunar infarcts involving the hemispheric cerebral white  matter and deep gray nuclei. Electronically Signed   By: Rise Mu M.D.   On: 06/22/2021 02:12   CT HEAD CODE STROKE WO CONTRAST  Result Date: 06/21/2021 CLINICAL DATA:  Code stroke.  Stroke/TIA EXAM: CT HEAD WITHOUT CONTRAST TECHNIQUE: Contiguous axial images were obtained from the base of the skull through the vertex without intravenous contrast. COMPARISON:  06/18/2021 FINDINGS: Brain: Chronic small-vessel ischemic changes affect the pons and cerebellum. Cerebral hemispheres show generalized atrophy. Chronic arachnoid cyst at the anterior middle cranial fossa on the right. Old small vessel infarctions of the thalami and basal ganglia. Extensive chronic small-vessel ischemic changes of the white matter. Old left parietal cortical and subcortical infarction. Vascular: There is atherosclerotic calcification of the major vessels at the base of the brain. Skull: Negative Sinuses/Orbits: Clear/normal Other: None ASPECTS (Alberta Stroke Program Early CT Score) - Ganglionic level infarction (caudate, lentiform nuclei, internal capsule, insula,  M1-M3 cortex): 7 - Supraganglionic infarction (M4-M6 cortex): 3 allowing for the old infarction. Total score (0-10 with 10 being normal): 10 allowing for the old infarction. IMPRESSION: 1. No acute finding by CT. Extensive chronic ischemic changes throughout the brain as outlined above. 2. ASPECTS is 10, allowing for the old left parietal infarction. 3. These results were communicated to Dr. Derry Lory at 11:15 am on 06/21/2021 by text page via the [facility] messaging system. Electronically Signed   By: Paulina Fusi M.D.   On: 06/21/2021 11:16   CT ANGIO HEAD NECK W WO CM (CODE STROKE)  Result Date: 06/21/2021 CLINICAL DATA:  Aphasia. EXAM: CT ANGIOGRAPHY HEAD AND NECK TECHNIQUE: Multidetector CT imaging of the head and neck was performed using the standard protocol during bolus administration of intravenous contrast. Multiplanar CT image reconstructions and MIPs  were obtained to evaluate the vascular anatomy. Carotid stenosis measurements (when applicable) are obtained utilizing NASCET criteria, using the distal internal carotid diameter as the denominator. CONTRAST:  44mL OMNIPAQUE IOHEXOL 350 MG/ML SOLN COMPARISON:  Three days ago FINDINGS: CTA NECK FINDINGS Aortic arch: Atheromatous calcification of the aorta. No acute finding Right carotid system: Common carotid tortuosity with atheromatous calcification. No ulceration or dissection. No flow limiting stenosis Left carotid system: Atheromatous calcification at the bifurcation with traversing stent. No in stent stenosis or adjacent dissection Vertebral arteries: Proximal subclavian atherosclerosis with irregular but mainly calcified plaque on the left. Vertebral arteries are smoothly contoured and widely patent to the dura. Skeleton: Negative for acute finding Other neck: Negative Upper chest: No acute finding Review of the MIP images confirms the above findings CTA HEAD FINDINGS Anterior circulation: Atheromatous calcification of the carotid siphons. Atheromatous irregularity of bilateral ACA and MCA branches. Unchanged high-grade narrowing of a left M2 branch at the sylvian fissure a marked on 13:89. High-grade wasting of the right even more proximal M2 branch. Negative for aneurysm Posterior circulation: Vertebral and basilar arteries are diffusely patent. Advanced bilateral PCA stenosis at the P2 segments with near flow gap on the left where the stenotic segment has an irregular shape, unchanged. Negative for aneurysm Venous sinuses: Unremarkable for the arterial phase Anatomic variants: None significant Review of the MIP images confirms the above findings IMPRESSION: 1. No emergent finding or change from 3 days ago. 2. Advanced intracranial atherosclerosis, especially bilateral proximal PCA and M2 segments. 3. Patent left carotid stent. Electronically Signed   By: Tiburcio Pea M.D.   On: 06/21/2021 11:27     Cardiac Studies     Echo 06/19/21:  1. Definity contrast shows early fibrinous thrombus formation in the  apex.      . Left ventricular ejection fraction, by estimation, is 25 to 30%. The  left ventricle has severely decreased function. The left ventricle  demonstrates regional wall motion abnormalities (see scoring  diagram/findings for description). The left  ventricular internal cavity size was mildly dilated. Left ventricular  diastolic parameters are consistent with Grade III diastolic dysfunction  (restrictive). There is severe akinesis of the left ventricular, entire  anteroseptal wall and apical segment.   2. Right ventricular systolic function is normal. The right ventricular  size is normal.   3. The mitral valve is grossly normal. No evidence of mitral valve  regurgitation.   4. The aortic valve is grossly normal. Aortic valve regurgitation is not  visualized.      Echo 03/06/21:  1. Difficult study due to poor definition of the LV endocardial border.  Recommend definity contrast for future  studies. Left ventricular ejection  fraction, by estimation, is 55 to 60%. The left ventricle has normal  function. Left ventricular endocardial   border not optimally defined to evaluate regional wall motion, but  appears grossly normal. There is mild concentric left ventricular  hypertrophy. Left ventricular diastolic parameters are consistent with  Grade I diastolic dysfunction (impaired  relaxation).   2. Right ventricular systolic function is normal. The right ventricular  size is normal. Tricuspid regurgitation signal is inadequate for assessing  PA pressure.   3. The mitral valve is normal in structure. No evidence of mitral valve  regurgitation. No evidence of mitral stenosis.   4. The aortic valve is tricuspid. Aortic valve regurgitation is not  visualized. No aortic stenosis is present.   5. The inferior vena cava is normal in size with greater than 50%  respiratory  variability, suggesting right atrial pressure of 3 mmHg.     Patient Profile     65 y.o. female with type 2 diabetes mellitus and mixed hyperlipidemia (refusing statins), hypertension, severe cerebrovascular atherosclerotic disease and multiple strokes, diagnosed with new left ventricular regional wall motion abnormalities and left ventricular systolic dysfunction on echocardiogram.  Consulted for report of possible LV apical thrombus (not confirmed on my review of images). Transient neuro deterioration 06/21/2021 due to hypotension.  Assessment & Plan    LV dysfunction:  regional abnormalities suggest CAD (but could be takotsubo sd. also). No clinical CHF.  All meds stopped. Resume low dose carvedilol. IF BP remains >140, start losartan 12.5 mg daily next. No need for diuretics. Repeat echo with Definity in another 2 weeks or so. If wall motion still abnormal, recommend outpatient coronary CTAngio. Recurrent strokes:  attributable to extensive atherosclerotic cerebrovascular disease. No LV thrombus on my review of echo. On antiplatelet therapy. Avoid hypotension. HLP: on atorvastatin 80 mg daily. She has refused statins before. Not sure she will take after DC. Target LDL<70. HTN: increase carvedilol to 6.25 mg twice daily. Target SBP 130-150 due to severe ASCVD  [facility] will sign off.   Medication Recommendations:  carvedilol 6.25 mg twice daily, losartan 12.5 mg daily Other recommendations (labs, testing, etc):  repeat echo w Definity in 3 weeks Follow up as an outpatient:  after echocardiogram   For questions or updates, please contact [facility] Please consult [email or web address] for contact info under        Signed, Thurmon Fair, MD  06/23/2021, 9:06 AM

## 2021-06-23 NOTE — Progress Notes (Signed)
Inpatient Rehabilitation Admissions Coordinator   I will place rehab consult per protocol for complete assessment of her acute inpatient rehab needs. An Admissions Coordinator will follow up this weekend for full assessment of potential to [facility].  Ottie Glazier, RN, MSN Rehab Admissions Coordinator [phone] 06/23/2021 1:18 PM

## 2021-06-24 DIAGNOSIS — I10 Essential (primary) hypertension: Secondary | ICD-10-CM | POA: Diagnosis not present

## 2021-06-24 DIAGNOSIS — E1149 Type 2 diabetes mellitus with other diabetic neurological complication: Secondary | ICD-10-CM | POA: Diagnosis not present

## 2021-06-24 LAB — GLUCOSE, CAPILLARY
Glucose-Capillary: 140 mg/dL — ABNORMAL HIGH (ref 70–99)
Glucose-Capillary: 132 mg/dL — ABNORMAL HIGH (ref 70–99)
Glucose-Capillary: 198 mg/dL — ABNORMAL HIGH (ref 70–99)
Glucose-Capillary: 110 mg/dL — ABNORMAL HIGH (ref 70–99)

## 2021-06-24 NOTE — Progress Notes (Signed)
HD#7 Subjective:  Overnight Events: No acute evens overnight  Ms. Spruell is resting in bed comfortably no acute distress.  She states she is feeling well.  Has no acute concerns overnight.  States she has not been eating well but that is because she does not care for the food here.  She states that she is still willing to go to [facility].  Objective:  Vital signs in last 24 hours: Vitals:   06/23/21 1156 06/23/21 1601 06/23/21 2128 06/24/21 0400  BP: (!) 167/76 (!) 145/82 (!) 185/92 (!) 172/74  Pulse:   75   Resp: 17 17    Temp: 98.2 F (36.8 C) 97.8 F (36.6 C) 97.8 F (36.6 C) 98.1 F (36.7 C)  TempSrc: Oral Oral Oral   SpO2: 98% 97%    Weight:      Height:       Supplemental O2: Room Air SpO2: 97 %   Physical Exam:  Constitutional: Well-appearing, no acute distress HENT: normocephalic atraumatic Eyes: conjunctiva non-erythematous Neck: supple Cardiovascular: regular rate Pulmonary/Chest: normal work of breathing on room air MSK: normal bulk and tone Neurological: alert & oriented x 3 Skin: warm and dry Psych: Normal mood and thought process  Filed Weights   06/17/21 1333 06/18/21 0042  Weight: 77.1 kg 72.6 kg    No intake or output data in the 24 hours ending 06/24/21 0620 Net IO Since Admission: -730.17 mL [06/24/21 0620]  Pertinent Labs: CBC Latest Ref Rng & Units 06/23/2021 06/22/2021 06/21/2021  WBC 4.0 - 10.5 K/uL 8.4 7.7 6.9  Hemoglobin 12.0 - 15.0 g/dL 22.9 79.8 92.1  Hematocrit 36.0 - 46.0 % 39.0 38.0 41.9  Platelets 150 - 400 K/uL 266 257 281    CMP Latest Ref Rng & Units 06/23/2021 06/22/2021 06/21/2021  Glucose 70 - 99 mg/dL 194(R) 740(C) 144(Y)  BUN 8 - 23 mg/dL 23 18(H) 63(J)  Creatinine 0.44 - 1.00 mg/dL 4.97(W) 2.63(Z) 8.58(I)  Sodium 135 - 145 mmol/L 135 136 138  Potassium 3.5 - 5.1 mmol/L 3.6 3.9 4.9  Chloride 98 - 111 mmol/L 106 107 107  CO2 22 - 32 mmol/L 20(L) 22 21(L)  Calcium 8.9 - 10.3 mg/dL 5.0(Y) 9.0 9.4  Total  Protein 6.5 - 8.1 g/dL - - -  Total Bilirubin 0.3 - 1.2 mg/dL - - -  Alkaline Phos 38 - 126 U/L - - -  AST 15 - 41 U/L - - -  ALT 0 - 44 U/L - - -    Imaging: No results found.  Assessment/Plan:   Active Problems:   Diabetes mellitus type 2 with neurological manifestations (HCC)   Pure hypercholesterolemia   Essential hypertension, benign   Weakness due to old stroke   Cerebral thrombosis with cerebral infarction   Patient Summary: Jade [name] is 65 y.o. with pertinent PMH HTN, HLD, T2DM, former smoker ,multiple CVA with recent CVA in August , and left carotid artery stenosis s/p left transcarotid artery revascularization (04/06/2021) ,who presented with weakness and admitted for subacute stroke.   Subacute CVA Weakness Patient doing well, BP goal of 66-1 50 systolically.  Started low-dose losartan yesterday and carvedilol was increased by cardiology.  We will continue to watch blood pressures closely - Continue to control modifiable risk factors: HTN, HLD, T2DM -Continue aspirin and Brilinta. -Telemetry monitoring - Social work working on discharge.    Covid-19  - Asymptomatic , positive on screening test. Has not received Covid Vaccine.  -Continue to monitor. If  patient develops hypoxia will consider initiation of steroids. Not symptomatic 06/22/21.   Hypertension -Blood pressure has been less labile, goal of 66-1 30 systolically.  Continue carvedilol and continue low-dose losartan.  Consider increasing losartan tomorrow -Continue losartan and metoprolol   Hyperlipidemia - Continue Atorvastatin 80 mg   AKI (resolved) CKD Stage 3a (stable) -Baseline creatinine 1.1-1.3. Creatinine 1.33 on 06/23/21. From 1.65 on 06/21/21. - Continue to monitor   Type 2 Diabetes Mellitus  - Hemoglobin A1c 6.5 03/2021, glucose level stable.  Continue SSI   Diarrhea Chronic. States bowel movement soon after eating.  -Will continue imodium daily. Recommend OP workup.  -Continue  to monitor.   Diet: Carb-Modified IVF: None,None VTE: Enoxaparin Code: Full PT/OT recs: None, none.   Dispo: Anticipated discharge to CIR days pending placement accepted to CIR at [facility]  Thalia Bloodgood DO Internal Medicine Resident PGY-2 Pager [phone] Please contact the on call pager after 5 pm and on weekends at [phone].

## 2021-06-24 NOTE — Progress Notes (Signed)
Inpatient Rehab Admissions Coordinator:   TOC is pursuing [facility]. CIR will follow at a distance for now.   Megan Salon, MS, CCC-SLP Rehab Admissions Coordinator  [phone] (celll) [phone] (office)

## 2021-06-25 DIAGNOSIS — U071 COVID-19: Secondary | ICD-10-CM

## 2021-06-25 LAB — CBC
HCT: 37.3 % (ref 36.0–46.0)
Hemoglobin: 12.8 g/dL (ref 12.0–15.0)
MCH: 32.2 pg (ref 26.0–34.0)
MCHC: 34.3 g/dL (ref 30.0–36.0)
MCV: 94 fL (ref 80.0–100.0)
Platelets: 351 10*3/uL (ref 150–400)
RBC: 3.97 MIL/uL (ref 3.87–5.11)
RDW: 12.8 % (ref 11.5–15.5)
WBC: 8.2 10*3/uL (ref 4.0–10.5)
nRBC: 0 % (ref 0.0–0.2)

## 2021-06-25 LAB — BASIC METABOLIC PANEL
Anion gap: 9 (ref 5–15)
BUN: 17 mg/dL (ref 8–23)
CO2: 20 mmol/L — ABNORMAL LOW (ref 22–32)
Calcium: 8.9 mg/dL (ref 8.9–10.3)
Chloride: 107 mmol/L (ref 98–111)
Creatinine, Ser: 1.11 mg/dL — ABNORMAL HIGH (ref 0.44–1.00)
GFR, Estimated: 55 mL/min — ABNORMAL LOW (ref >60)
Glucose, Bld: 126 mg/dL — ABNORMAL HIGH (ref 70–99)
Potassium: 3 mmol/L — ABNORMAL LOW (ref 3.5–5.1)
Sodium: 136 mmol/L (ref 135–145)

## 2021-06-25 LAB — GLUCOSE, CAPILLARY
Glucose-Capillary: 133 mg/dL — ABNORMAL HIGH (ref 70–99)
Glucose-Capillary: 132 mg/dL — ABNORMAL HIGH (ref 70–99)
Glucose-Capillary: 181 mg/dL — ABNORMAL HIGH (ref 70–99)
Glucose-Capillary: 157 mg/dL — ABNORMAL HIGH (ref 70–99)

## 2021-06-25 MED ORDER — HYDROCORTISONE 1 % EX CREA: TOPICAL_CREAM | Freq: Three times a day (TID) | CUTANEOUS | Status: DC | PRN

## 2021-06-25 MED ORDER — HYDROCORTISONE 1 % EX LOTN
TOPICAL_LOTION | Freq: Three times a day (TID) | CUTANEOUS | Status: DC
  Filled 2021-06-25: qty 118

## 2021-06-25 NOTE — TOC Progression Note (Addendum)
Transition of Care [facility]) - Progression Note    Patient Details  Name: Jade [name] MRN: [id number] Date of Birth: March 22, 1956  [facility]) CM/SW Contact  Lawerance Sabal, RN Phone Number: 06/25/2021, 11:06 AM  Clinical Narrative:   Coralyn Pear out to Shanda Bumps, liaison w [facility], for any updates on insurance authorization. Awaiting callback.   [employer] office, [phone], they state that they it was filed Friday afternoon, checked while on phone and there is not authorization yet. TOC will continue to follow and anticipate insurance response Monday.     Expected Discharge Plan: IP Rehab Facility Barriers to Discharge: Continued Medical Work up  Expected Discharge Plan and Services Expected Discharge Plan: IP Rehab Facility In-house Referral: Clinical Social Work   Post Acute Care Choice: IP Rehab Living arrangements for the past 2 months: Single Family Home                                       Social Determinants of Health (SDOH) Interventions    Readmission Risk Interventions No flowsheet data found.

## 2021-06-25 NOTE — Plan of Care (Signed)
  Problem: Education: °Goal: Knowledge of General Education information will improve °Description: Including pain rating scale, medication(s)/side effects and non-pharmacologic comfort measures °Outcome: Progressing °  °Problem: Health Behavior/Discharge Planning: °Goal: Ability to manage health-related needs will improve °Outcome: Progressing °  °Problem: Clinical Measurements: °Goal: Ability to maintain clinical measurements within normal limits will improve °Outcome: Progressing °Goal: Will remain free from infection °Outcome: Progressing °Goal: Diagnostic test results will improve °Outcome: Progressing °  °Problem: Clinical Measurements: °Goal: Will remain free from infection °Outcome: Progressing °  °Problem: Clinical Measurements: °Goal: Diagnostic test results will improve °Outcome: Progressing °  °

## 2021-06-25 NOTE — Progress Notes (Signed)
HD#8 Subjective:  Overnight Events: No acute evens overnight  No acute concerns voiced. Stated her back was itching and this happens at home and she uses Aveeno for relief.   Objective:  Vital signs in last 24 hours: Vitals:   06/24/21 2119 06/24/21 2136 06/25/21 0002 06/25/21 0500  BP:  (!) 168/83 112/67 134/85  Pulse: 72  76   Resp:      Temp: 98.6 F (37 C)  98.8 F (37.1 C) 98.7 F (37.1 C)  TempSrc: Oral  Oral Oral  SpO2:      Weight:      Height:       Supplemental O2: Room Air SpO2: 98 %   Physical Exam:   General: NAD  Mouth: Moist mucus membrane. Neck: Neck supple with full range of motion (ROM).  Lungs: CTAB, no wheeze, rhonchi or rales.  Cardiovascular: Normal heart sounds, no r/m/g Abdomen: No TTP, normal bowel sounds MSK: No asymmetry or muscle atrophy.  Skin: warm, dry, good skin turgor, no lesions, some irritation on her back, lipoma present on left middle back that is chronic.  Neuro: Alert and oriented. CN grossly intact Psych: Normal mood and normal affect   Filed Weights   06/17/21 1333 06/18/21 0042  Weight: 77.1 kg 72.6 kg     Intake/Output Summary (Last 24 hours) at 06/25/2021 0912 Last data filed at 06/24/2021 2130 Gross per 24 hour  Intake 300 ml  Output 300 ml  Net 0 ml   Net IO Since Admission: -730.17 mL [06/25/21 0912]  Pertinent Labs: CBC Latest Ref Rng & Units 06/25/2021 06/23/2021 06/22/2021  WBC 4.0 - 10.5 K/uL 8.2 8.4 7.7  Hemoglobin 12.0 - 15.0 g/dL 12.8 12.9 12.3  Hematocrit 36.0 - 46.0 % 37.3 39.0 38.0  Platelets 150 - 400 K/uL 351 266 257    CMP Latest Ref Rng & Units 06/25/2021 06/23/2021 06/22/2021  Glucose 70 - 99 mg/dL 126(H) 115(H) 125(H)  BUN 8 - 23 mg/dL 17 23 28(H)  Creatinine 0.44 - 1.00 mg/dL 1.11(H) 1.13(H) 1.55(H)  Sodium 135 - 145 mmol/L 136 135 136  Potassium 3.5 - 5.1 mmol/L 3.0(L) 3.6 3.9  Chloride 98 - 111 mmol/L 107 106 107  CO2 22 - 32 mmol/L 20(L) 20(L) 22  Calcium 8.9 - 10.3 mg/dL  8.9 8.8(L) 9.0  Total Protein 6.5 - 8.1 g/dL - - -  Total Bilirubin 0.3 - 1.2 mg/dL - - -  Alkaline Phos 38 - 126 U/L - - -  AST 15 - 41 U/L - - -  ALT 0 - 44 U/L - - -    Imaging: No results found.  Assessment/Plan:   Active Problems:   Diabetes mellitus type 2 with neurological manifestations ([place])   Pure hypercholesterolemia   Essential hypertension, benign   Weakness due to old stroke   Cerebral thrombosis with cerebral infarction   Patient Summary: Jade [name] is 65 y.o. with pertinent PMH HTN, HLD, T2DM, former smoker ,multiple CVA with recent CVA in August , and left carotid artery stenosis s/p left transcarotid artery revascularization (04/06/2021) ,who presented with weakness and admitted for subacute stroke.   Subacute CVA Weakness Patient doing well, BP goal of [id number] 50 systolic.  On low dose losartan, and carvedilol 6.25 mg. We will continue to watch blood pressures closely - Continue to control modifiable risk factors: HTN, HLD, T2DM -Continue aspirin and Brilinta. -Telemetry monitoring - Social work working on discharge to [facility].    Covid-19  -  Asymptomatic , positive on screening test. Has not received Covid Vaccine.  -Continue to monitor. If patient develops hypoxia will consider initiation of steroids. Not symptomatic 06/25/21.   Hypertension -Blood pressure has ranged from 112/67 to 168/83. Goal of 130-150 systolic.  Continue carvedilol and continue low-dose losartan.   -Continue losartan and metoprolol   Hyperlipidemia - Continue Atorvastatin 80 mg   AKI (resolved) CKD Stage 3a (stable) -Baseline creatinine 1.1-1.3. Creatinine 1.11 on 06/25/21.  - Continue to monitor   Type 2 Diabetes Mellitus  - Hemoglobin A1c 6.5 03/2021, glucose level stable.  Continue SSI   Diarrhea (chronic) Chronic. States bowel movement soon after eating.  -Will continue imodium daily. Recommend OP workup.  -Continue to monitor.   Diet: Carb-Modified IVF:  None,None VTE: Enoxaparin Code: Full PT/OT recs: None, none.   Dispo: Anticipated discharge to [facility] pending placement accepted to IR at [facility], MD Eligha Bridegroom. [facility] Internal Medicine Residency, PGY-1  Please contact the on call pager after 5 pm and on weekends at [phone].

## 2021-06-26 LAB — GLUCOSE, CAPILLARY
Glucose-Capillary: 127 mg/dL — ABNORMAL HIGH (ref 70–99)
Glucose-Capillary: 144 mg/dL — ABNORMAL HIGH (ref 70–99)
Glucose-Capillary: 159 mg/dL — ABNORMAL HIGH (ref 70–99)
Glucose-Capillary: 164 mg/dL — ABNORMAL HIGH (ref 70–99)
Glucose-Capillary: 143 mg/dL — ABNORMAL HIGH (ref 70–99)

## 2021-06-26 MED ORDER — BARRIER CREAM NON-SPECIFIED
1.0000 "application " | TOPICAL_CREAM | Freq: Two times a day (BID) | TOPICAL | Status: DC | PRN
  Filled 2021-06-26: qty 1

## 2021-06-26 MED ORDER — LOPERAMIDE HCL 2 MG PO CAPS
4.0000 mg | ORAL_CAPSULE | ORAL | Status: DC | PRN
  Administered 2021-06-27: 4 mg via ORAL
  Filled 2021-06-26: qty 2

## 2021-06-26 NOTE — NC FL2 (Signed)
[place] MEDICAID FL2 LEVEL OF CARE SCREENING TOOL     IDENTIFICATION  Patient Name: Jade [name] Birthdate: 07-29-1956 Sex: female Admission Date (Current Location): 06/17/2021  [facility] and [place] Number:  Herbalist and Address:  The . [facility], [place] [address], [place], [place] [address]      Provider Number: [id number]  Attending Physician Name and Address:  Angelica Pou, MD  Relative Name and Phone Number:  Luciano Cutter   [phone]    Current Level of Care: Hospital Recommended Level of Care: [place] Prior Approval Number:    Date Approved/Denied:   PASRR Number: [id number]  Discharge Plan: SNF    Current Diagnoses: Patient Active Problem List   Diagnosis Date Noted   COVID-19 06/25/2021   Cerebral thrombosis with cerebral infarction 06/19/2021   Weakness due to old stroke 06/17/2021   Labile blood glucose    Essential hypertension    Spastic hemiparesis (HCC)    Stage 3a chronic kidney disease ([place])    Controlled type 2 diabetes mellitus with hyperglycemia, without long-term current use of insulin ([place])    CVA (cerebral vascular accident) ([place]) 04/13/2021   TIA (transient ischemic attack) 04/02/2021   Carotid artery stenosis with cerebral infarction ([place]) 04/02/2021   COPD (chronic obstructive pulmonary disease) ([place]) 04/02/2021   Stroke-like symptoms 03/05/2021   Acute CVA (cerebrovascular accident) ([place]) 03/21/2018   Obesity, Class III, BMI 40-49.9 (morbid obesity) ([place]) 03/21/2018   IBS (irritable bowel syndrome) 04/12/2015   Hair loss 10/20/2014   Primary osteoarthritis of left knee 09/29/2014   Visit for screening mammogram 08/30/2014   Routine general medical examination at a health care facility 08/30/2014   Tinea corporis 12/21/2013   Essential hypertension, benign 12/21/2013   Low back pain 08/24/2013   Patient noncompliant with statin medication 02/23/2013   H/O abnormal Pap  smear 10/24/2012   Osteopenia 09/26/2012   Diabetic neuropathy, painful ([place]) 05/26/2012   Diabetes mellitus type 2 with neurological manifestations ([place]) 03/20/2012   Pure hypercholesterolemia 03/20/2012   Chronic venous insufficiency 03/20/2012    Orientation RESPIRATION BLADDER Height & Weight     Self, Time, Situation, Place  Normal Incontinent, External catheter Weight: 160 lb 0.9 oz (72.6 kg) Height:  5\' 6"  (167.6 cm)  BEHAVIORAL SYMPTOMS/MOOD NEUROLOGICAL BOWEL NUTRITION STATUS      Continent Diet (see discharge summary)  AMBULATORY STATUS COMMUNICATION OF NEEDS Skin   Total Care Verbally Normal                       Personal Care Assistance Level of Assistance  Bathing, Feeding, Dressing Bathing Assistance: Maximum assistance Feeding assistance: Limited assistance Dressing Assistance: Maximum assistance     Functional Limitations Info  Sight, Hearing, Speech Sight Info: Adequate Hearing Info: Adequate Speech Info: Adequate    SPECIAL CARE FACTORS FREQUENCY  PT (By licensed PT), OT (By licensed OT)     PT Frequency: 5x week OT Frequency: 5x week            Contractures Contractures Info: Not present    Additional Factors Info  Code Status, Allergies Code Status Info: DNR Allergies Info: Baclofen, Nickel           Current Medications (06/26/2021):  This is the current hospital active medication list Current Facility-Administered Medications  Medication Dose Route Frequency Provider Last Rate Last Admin   acetaminophen (TYLENOL) tablet 1,000 mg  1,000 mg Oral Q6H PRN Riesa Pope, MD  1,000 mg at 06/21/21 0456   Or   acetaminophen (TYLENOL) suppository 650 mg  650 mg Rectal Q6H PRN Katsadouros, Vasilios, MD       aspirin EC tablet 81 mg  81 mg Oral Daily Evlyn Kanner, MD   81 mg at 06/26/21 0849   atorvastatin (LIPITOR) tablet 80 mg  80 mg Oral Daily Steffanie Rainwater, MD   80 mg at 06/26/21 1610   barrier cream (non-specified)  1 application  1 application Topical BID PRN Steffanie Rainwater, MD       carvedilol (COREG) tablet 6.25 mg  6.25 mg Oral BID WC Croitoru, Mihai, MD   6.25 mg at 06/26/21 0849   dantrolene (DANTRIUM) capsule 50 mg  50 mg Oral QHS Steffanie Rainwater, MD   50 mg at 06/25/21 2205   enoxaparin (LOVENOX) injection 40 mg  40 mg Subcutaneous Q24H Evlyn Kanner, MD   40 mg at 06/26/21 0848   hydrocortisone cream 1 %   Topical TID PRN Gwenevere Abbot, MD       insulin aspart (novoLOG) injection 0-5 Units  0-5 Units Subcutaneous QHS Albertha Ghee, MD   2 Units at 06/23/21 2212   insulin aspart (novoLOG) injection 0-9 Units  0-9 Units Subcutaneous TID WC Albertha Ghee, MD   2 Units at 06/26/21 1254   loperamide (IMODIUM) capsule 4 mg  4 mg Oral PRN Steffanie Rainwater, MD       losartan (COZAAR) tablet 12.5 mg  12.5 mg Oral Daily Gwenevere Abbot, MD   12.5 mg at 06/26/21 0849   ondansetron (ZOFRAN) tablet 4 mg  4 mg Oral Q6H PRN Steffanie Rainwater, MD       Or   ondansetron [facility]) injection 4 mg  4 mg Intravenous Q6H PRN Steffanie Rainwater, MD   4 mg at 06/21/21 1132   senna-docusate (Senokot-S) tablet 1 tablet  1 tablet Oral QHS PRN Steffanie Rainwater, MD       tamsulosin [facility]) capsule 0.4 mg  0.4 mg Oral QPC supper Steffanie Rainwater, MD   0.4 mg at 06/25/21 1652   ticagrelor (BRILINTA) tablet 90 mg  90 mg Oral BID Evlyn Kanner, MD   90 mg at 06/26/21 9604     Discharge Medications: Please see discharge summary for a list of discharge medications.  Relevant Imaging Results:  Relevant Lab Results:   Additional Information SS# [id number]. Pt is not vaccinated for covid.  Lorri Frederick, LCSW

## 2021-06-26 NOTE — Progress Notes (Signed)
Occupational Therapy Treatment Patient Details Name: Jade [name] MRN: [id number] DOB: 06-Oct-1955 Today's Date: 06/26/2021   History of present illness 65 y.o. female admitted 10/22 with increased bil weakness. 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  PMH: arthritis, CKD, COPD, CVA Aug 2022 with revascularization, fibromyalgia, gout, HTN, migraine, OSA, DM2   OT comments  OT treatment session with focus on self-care re-education, bed mobility and R NMR. Patient continues to require Mod to Total A grossly for self-care tasks secondary to deficits listed below including R hemiplegia with nerve related pain, visual deficits, decreased cognition, and decreased sitting/standing balance and would benefit from continued acute OT services in prep for safe d/c to next level of care.    Recommendations for follow up therapy are one component of a multi-disciplinary discharge planning process, led by the attending physician.  Recommendations may be updated based on patient status, additional functional criteria and insurance authorization.    Follow Up Recommendations  Acute inpatient rehab (3hours/day)    Assistance Recommended at Discharge Frequent or constant Supervision/Assistance  Equipment Recommendations  Other (comment) (Defer to next level of care)    Recommendations for Other Services Rehab consult    Precautions / Restrictions Precautions Precautions: Fall;Other (comment) Precaution Comments: Hx of CVA with R hemi Restrictions Weight Bearing Restrictions: No       Mobility Bed Mobility Overal bed mobility: Needs Assistance Bed Mobility: Rolling;Sidelying to Sit Rolling: Max assist         General bed mobility comments: Max A for all parts of bed mobility with use of chuck pad. Increased time/effort and cues for hand placement.    Transfers Overall transfer  level: Needs assistance                 General transfer comment: Patient declined transfer to recliner this a.m. secondary to fatigue.     Balance Overall balance assessment: Needs assistance Sitting-balance support: No upper extremity supported;Single extremity supported;Feet supported Sitting balance-Leahy Scale: Poor Sitting balance - Comments: Improved static sitting balance this date with Min A initially. Progressed to Min gaurd with cues for orientation to midline. Postural control: Posterior lean;Right lateral lean                                 ADL either performed or assessed with clinical judgement   ADL Overall ADL's : Needs assistance/impaired Eating/Feeding: Set up;Sitting Eating/Feeding Details (indicate cue type and reason): Requires use of non-dominant LUE Grooming: Minimal assistance;Sitting   Upper Body Bathing: Moderate assistance;Bed level   Lower Body Bathing: Total assistance;Bed level   Upper Body Dressing : Moderate assistance;Bed level   Lower Body Dressing: Total assistance;Bed level       Toileting- Clothing Manipulation and Hygiene: Total assistance;Bed level Toileting - Clothing Manipulation Details (indicate cue type and reason): Patient found to be incontient of bowel. Total A for hygiene/clothing management at bedlevel.             Vision       Perception     Praxis      Cognition Arousal/Alertness: Awake/alert Behavior During Therapy: Flat affect Overall Cognitive Status: Impaired/Different from baseline Area of Impairment: Orientation;Attention;Memory;Following commands;Awareness;Problem solving                 Orientation Level: Disoriented to;Time Current Attention Level: Sustained Memory: Decreased short-term  memory Following Commands: Follows one step commands with increased time   Awareness: Emergent Problem Solving: Slow processing;Difficulty sequencing;Requires verbal cues;Requires tactile  cues General Comments: Requiring increased time to follow 1-step verbal commands. Increased time needed to process verbal information; slow to respond.          Exercises     Shoulder Instructions       General Comments VSS on RA.    Pertinent Vitals/ Pain       Pain Assessment: Faces Faces Pain Scale: Hurts even more Pain Location: RUE and RLE with PROM/AAROM Pain Descriptors / Indicators: Tingling;Numbness;Sharp;Shooting Pain Intervention(s): Limited activity within patient's tolerance;Monitored during session;Repositioned  Home Living                                          Prior Functioning/Environment              Frequency  Min 2X/week        Progress Toward Goals  OT Goals(current goals can now be found in the care plan section)  Progress towards OT goals: Progressing toward goals  Acute Rehab OT Goals OT Goal Formulation: With patient Time For Goal Achievement: 07/02/21 Potential to Achieve Goals: Fair ADL Goals Pt Will Perform Grooming: with set-up;standing;sitting Pt/caregiver will Perform Home Exercise Program: Increased strength;Left upper extremity;With theraband;With written HEP provided;With Supervision Additional ADL Goal #1: Pt will increase to tolerate x6 mins of OOB ADL tasks with minA overall. Additional ADL Goal #2: Student will tolerate standing x1 min with use of stedy or least restrictive AD with maxA overall.  Plan Discharge plan remains appropriate;Frequency remains appropriate    Co-evaluation                 AM-PAC OT "6 Clicks" Daily Activity     Outcome Measure   Help from another person eating meals?: A Little Help from another person taking care of personal grooming?: A Little Help from another person toileting, which includes using toliet, bedpan, or urinal?: Total Help from another person bathing (including washing, rinsing, drying)?: A Lot Help from another person to put on and taking off  regular upper body clothing?: A Lot Help from another person to put on and taking off regular lower body clothing?: Total 6 Click Score: 12    End of Session Equipment Utilized During Treatment: Gait belt  OT Visit Diagnosis: Unsteadiness on feet (R26.81);Muscle weakness (generalized) (M62.81);Hemiplegia and hemiparesis Hemiplegia - Right/Left: Right Hemiplegia - dominant/non-dominant: Dominant Hemiplegia - caused by: Cerebral infarction   Activity Tolerance Patient tolerated treatment well;Patient limited by fatigue   Patient Left in chair;with call bell/phone within reach;with chair alarm set   Nurse Communication Mobility status        Time: 3716-9678 OT Time Calculation (min): 39 min  Charges: OT General Charges $OT Visit: 1 Visit OT Treatments $Self Care/Home Management : 23-37 mins $Neuromuscular Re-education: 8-22 mins  [place] [facility]. OTR/L Supplemental OT, Department of rehab services [phone]  [place] Ran R H. 06/26/2021, 10:03 AM

## 2021-06-26 NOTE — TOC Progression Note (Addendum)
Transition of Care [facility]) - Progression Note    Patient Details  Name: Jade [name] MRN: [id number] Date of Birth: 1956/04/10  Transition of Care [facility]) CM/SW Contact  Lorri Frederick, LCSW Phone Number: 06/26/2021, 12:49 PM  Clinical Narrative:   Message from Bethann Humble.  Authorization referred to peer to peer: [phone] option 5 by 4pm.  MD notified.    1440: CSW spoke with pt by phone.  Updated her on peer to peer for IR auth.  Discussed SNF as back up plan and pt is agreeable to this, permission given to send out referral in hub.  Pt is not vaccinated and reports she will not agree to get covid vaccinated in order to go to a SNF, discussed that this may restrict some of her SNF options.    Expected Discharge Plan: IP Rehab Facility Barriers to Discharge: Continued Medical Work up  Expected Discharge Plan and Services Expected Discharge Plan: IP Rehab Facility In-house Referral: Clinical Social Work   Post Acute Care Choice: IP Rehab Living arrangements for the past 2 months: Single Family Home                                       Social Determinants of Health (SDOH) Interventions    Readmission Risk Interventions No flowsheet data found.

## 2021-06-26 NOTE — TOC Progression Note (Signed)
Transition of Care [facility]) - Progression Note    Patient Details  Name: SHANYA FERRISS MRN: [id number] Date of Birth: 1955-12-02  Transition of Care [facility]) CM/SW Contact  Lorri Frederick, LCSW Phone Number: 06/26/2021, 9:47 AM  Clinical Narrative:  CSW spoke with Verdon Cummins at [facility] inpatient [facility]--auth not back as of this AM.  They will continue to monitor.  They do have bed available and can receive pt as soon as Berkley Harvey is approved.      Expected Discharge Plan: IP Rehab Facility Barriers to Discharge: Continued Medical Work up  Expected Discharge Plan and Services Expected Discharge Plan: IP Rehab Facility In-house Referral: Clinical Social Work   Post Acute Care Choice: IP Rehab Living arrangements for the past 2 months: Single Family Home                                       Social Determinants of Health (SDOH) Interventions    Readmission Risk Interventions No flowsheet data found.

## 2021-06-26 NOTE — Progress Notes (Signed)
Inpatient Rehab Admissions Coordinator:   CIR will continue to follow from a distance, as TOC is pursuing admission to [facility].   Megan Salon, MS, CCC-SLP Rehab Admissions Coordinator  [phone] (celll) [phone] (office)

## 2021-06-26 NOTE — Progress Notes (Signed)
Pt noted with episode of Paroxysmal SVT, asymptomatic. Rayann Atway, DO notified, no new orders. Will continue to monitor.

## 2021-06-26 NOTE — Progress Notes (Signed)
HD#9 Subjective:  Overnight Events: No acute evens overnight  States she was having diarrhea that is not controlled by imodium. This is new from yesterday as she did not have this. Now she states she is going every 2 hours. She also endorsed food being tasteless which may be a symptom of her covid or her critique of the food as she stated the ginger ale tasted fine.   She did endorse feeling down as she has not been able to see her family. She states she would like to see her son.     Objective:  Vital signs in last 24 hours: Vitals:   06/25/21 1256 06/25/21 1647 06/25/21 2141 06/26/21 0458  BP: 128/63 (!) 145/66 (!) 161/66 (!) 155/79  Pulse: 75  85 74  Resp: 16 17 20 20   Temp: 98.4 F (36.9 C) 98.1 F (36.7 C) 98.3 F (36.8 C) 98.5 F (36.9 C)  TempSrc: Oral Oral Oral Axillary  SpO2: 98% 98%    Weight:      Height:       Supplemental O2: Room Air SpO2: 98 %   Physical Exam:  Physical Exam General: NAD Eyes: Conjunctivae pink, sclerae white, without jaundice.  Mouth: Lips normal color, without lesions. Moist mucus membrane. Neck: Neck supple with full range of motion (ROM). No masses or tenderness.  Lungs: CTAB, no wheeze, rhonchi or rales.  Cardiovascular: Normal heart sounds,  Abdomen: No TTP, normal bowel sounds Neuro: Alert and oriented. CN grossly intact Psych: depressed mood   Filed Weights   06/17/21 1333 06/18/21 0042  Weight: 77.1 kg 72.6 kg     Intake/Output Summary (Last 24 hours) at 06/26/2021 0534 Last data filed at 06/26/2021 0500 Gross per 24 hour  Intake --  Output 400 ml  Net -400 ml    Net IO Since Admission: -1,130.17 mL [06/26/21 0534]  Pertinent Labs: CBC Latest Ref Rng & Units 06/25/2021 06/23/2021 06/22/2021  WBC 4.0 - 10.5 K/uL 8.2 8.4 7.7  Hemoglobin 12.0 - 15.0 g/dL 12.8 12.9 12.3  Hematocrit 36.0 - 46.0 % 37.3 39.0 38.0  Platelets 150 - 400 K/uL 351 266 257    CMP Latest Ref Rng & Units 06/25/2021 06/23/2021  06/22/2021  Glucose 70 - 99 mg/dL 126(H) 115(H) 125(H)  BUN 8 - 23 mg/dL 17 23 28(H)  Creatinine 0.44 - 1.00 mg/dL 1.11(H) 1.13(H) 1.55(H)  Sodium 135 - 145 mmol/L 136 135 136  Potassium 3.5 - 5.1 mmol/L 3.0(L) 3.6 3.9  Chloride 98 - 111 mmol/L 107 106 107  CO2 22 - 32 mmol/L 20(L) 20(L) 22  Calcium 8.9 - 10.3 mg/dL 8.9 8.8(L) 9.0  Total Protein 6.5 - 8.1 g/dL - - -  Total Bilirubin 0.3 - 1.2 mg/dL - - -  Alkaline Phos 38 - 126 U/L - - -  AST 15 - 41 U/L - - -  ALT 0 - 44 U/L - - -    Imaging: No results found.  Assessment/Plan:   Active Problems:   Diabetes mellitus type 2 with neurological manifestations ([place])   Pure hypercholesterolemia   Essential hypertension, benign   Weakness due to old stroke   Cerebral thrombosis with cerebral infarction   COVID-19   Patient Summary: Jade [name] is 65 y.o. with pertinent PMH HTN, HLD, T2DM, former smoker ,multiple CVA with recent CVA in August , and left carotid artery stenosis s/p left transcarotid artery revascularization (04/06/2021) ,who presented with weakness and admitted for subacute stroke.   Subacute  CVA Weakness Patient doing well, BP goal of 130-1 50 systolic.  On low dose losartan 12.5 mg, and carvedilol 6.25 mg. We will continue to watch blood pressures closely - Continue to control modifiable risk factors: HTN, HLD, T2DM -Continue aspirin and Brilinta. -Discontinue telemetry monitoring -Social work working on discharge to [employer].      Hypertension -Blood pressure has ranged from 112/67 to 168/83. Goal of 130-150 systolic.  Continue carvedilol and continue low-dose losartan.   -Continue losartan and metoprolol   Hyperlipidemia - Continue Atorvastatin 80 mg  Diarrhea  Chronic. States bowel movement soon after eating. Previously stated imodium helped her with symptoms but now states it is not helping.  -Increase Loperamide to 4 mg prn -If that does not improve this, will add bile acid binder. -Recommend  OP workup.  -Continue to monitor.   Covid-19  - Asymptomatic , positive on screening test. Has not received Covid Vaccine.  -Continue to monitor. If patient develops hypoxia will consider initiation of steroids.   Depressed Mood Patient has depressed moods. States she is missing her family and would like to she her son. Appears isolation from Covid also contributing. Offered chaplain but she declined. Will continue to monitor this. -Continue to monitor.  -Will recommend outpatient follow up on this as she would be at baseline then.   Type 2 Diabetes Mellitus  - Hemoglobin A1c 6.5 03/2021, glucose level stable.  Continue SSI   AKI (resolved) CKD Stage 3a (stable) -Baseline creatinine 1.1-1.3. Creatinine 1.11 on 06/25/21.  - Continue to monitor   Diet: Carb-Modified IVF: None,None VTE: Enoxaparin Code: Full PT/OT recs: None, none.   Dispo: Anticipated discharge to [facility] pending placement accepted to IR at [facility], MD Eligha Bridegroom. [facility] Internal Medicine Residency, PGY-1  Please contact the on call pager after 5 pm and on weekends at [phone].

## 2021-06-27 DIAGNOSIS — R531 Weakness: Secondary | ICD-10-CM | POA: Diagnosis not present

## 2021-06-27 DIAGNOSIS — U071 COVID-19: Secondary | ICD-10-CM

## 2021-06-27 DIAGNOSIS — I639 Cerebral infarction, unspecified: Secondary | ICD-10-CM | POA: Diagnosis not present

## 2021-06-27 DIAGNOSIS — I10 Essential (primary) hypertension: Secondary | ICD-10-CM | POA: Diagnosis not present

## 2021-06-27 LAB — BASIC METABOLIC PANEL
Anion gap: 10 (ref 5–15)
BUN: 31 mg/dL — ABNORMAL HIGH (ref 8–23)
CO2: 20 mmol/L — ABNORMAL LOW (ref 22–32)
Calcium: 9.2 mg/dL (ref 8.9–10.3)
Chloride: 106 mmol/L (ref 98–111)
Creatinine, Ser: 1.49 mg/dL — ABNORMAL HIGH (ref 0.44–1.00)
GFR, Estimated: 39 mL/min — ABNORMAL LOW (ref >60)
Glucose, Bld: 148 mg/dL — ABNORMAL HIGH (ref 70–99)
Potassium: 3 mmol/L — ABNORMAL LOW (ref 3.5–5.1)
Sodium: 136 mmol/L (ref 135–145)

## 2021-06-27 LAB — GLUCOSE, CAPILLARY
Glucose-Capillary: 139 mg/dL — ABNORMAL HIGH (ref 70–99)
Glucose-Capillary: 197 mg/dL — ABNORMAL HIGH (ref 70–99)
Glucose-Capillary: 193 mg/dL — ABNORMAL HIGH (ref 70–99)
Glucose-Capillary: 140 mg/dL — ABNORMAL HIGH (ref 70–99)

## 2021-06-27 MED ORDER — POTASSIUM CHLORIDE 20 MEQ PO PACK
40.0000 meq | PACK | Freq: Once | ORAL | Status: AC
  Administered 2021-06-27: 40 meq via ORAL
  Filled 2021-06-27: qty 2

## 2021-06-27 MED ORDER — LOPERAMIDE HCL 2 MG PO CAPS
4.0000 mg | ORAL_CAPSULE | Freq: Two times a day (BID) | ORAL | Status: DC
  Administered 2021-06-27 – 2021-06-30 (×7): 4 mg via ORAL
  Filled 2021-06-27 (×7): qty 2

## 2021-06-27 NOTE — TOC Progression Note (Addendum)
Transition of Care [facility]) - Progression Note    Patient Details  Name: Jade [name] MRN: [id number] Date of Birth: Dec 14, 1955  Transition of Care [facility]) CM/SW Contact  Lorri Frederick, LCSW Phone Number: 06/27/2021, 10:29 AM  Clinical Narrative:  CSW informed that peer to peer was denied for [facility] inpatient rehab auth.  Pt can appeal.  CSW spoke with pt by phone, she does not wish to appeal, does want to move forward with SNF option.  Choice document given to pt through RN.  No offers currently, most facilities citing covid and pt will be out of quarantine tomorrow.  Pt agreed to review medicare.gov and provide facilities she would like, CSW will follow up as well.   1330: QC meeting, pt unlikely to be authorized for SNF admission based on 6 click score.  CSW spoke with pt discussed the above, discussed alternative of going home with [facility], pt agreeable to this, [facility] already active.  Pt reports she does have medicaid but it is not showing in epic, son is sole caretaker and HH aide would be useful, discussed the process for authorization.    CSW  spoke with son [name] about this plan with potential DC tomorrow, he would like to pursue [facility] aide as he has been sole caretaker for past month.  He could not locate medicaid number.    Expected Discharge Plan: IP Rehab Facility Barriers to Discharge: Continued Medical Work up  Expected Discharge Plan and Services Expected Discharge Plan: IP Rehab Facility In-house Referral: Clinical Social Work   Post Acute Care Choice: IP Rehab Living arrangements for the past 2 months: Single Family Home                                       Social Determinants of Health (SDOH) Interventions    Readmission Risk Interventions No flowsheet data found.

## 2021-06-27 NOTE — Progress Notes (Addendum)
HD#10 Subjective:  Overnight Events: NAEO   Patient feeling very isolated today and is tired of having to isolate for COVID. She is still really struggling to deal with her diarrhea and incontinence, misses her family and would like them to visit.   Objective:  Vital signs in last 24 hours: Vitals:   06/26/21 0850 06/26/21 2100 06/27/21 0910 06/27/21 1247  BP: (!) 148/66 124/80 (!) 120/59 (!) 124/56  Pulse: 77 79 82 80  Resp: 20 20 (!) 24 20  Temp: 98.6 F (37 C) 98.6 F (37 C) (!) 97 F (36.1 C) 98 F (36.7 C)  TempSrc: Axillary Oral Oral Oral  SpO2: 97% 94% 98% 98%  Weight:      Height:       Supplemental O2: Room Air SpO2: 98 %   Physical Exam:  Constitutional: Chronically ill appearing woman, in no acute distress HENT: normocephalic atraumatic, mucous membranes moist Eyes: conjunctiva non-erythematous Neck: supple Cardiovascular: regular rate and rhythm, no m/r/g Pulmonary/Chest: normal work of breathing on room air, lungs clear to auscultation bilaterally Abdominal: soft, non-tender, non-distended, normal bowel sound pattern MSK: normal bulk and tone Neurological: alert & oriented x 3, 5/5 strength in bilateral upper and lower extremities, normal gait Skin: Keratotic papule on the parietal scalp with some hemmorragic crusting along the base Psych: Frustrated, fixated on the "restrictions' related to Covid, endorsed feeling that her thinking and memory were "out of whack".  Some difficulty with time relationships (I.e. recalling sequence and timing of symptoms) and with expressing herself verbally.    Filed Weights   06/17/21 1333 06/18/21 0042  Weight: 77.1 kg 72.6 kg     Intake/Output Summary (Last 24 hours) at 06/27/2021 1416 Last data filed at 06/27/2021 1300 Gross per 24 hour  Intake 220 ml  Output 400 ml  Net -180 ml   Net IO Since Admission: -1,310.17 mL [06/27/21 1416]  Pertinent Labs: CBC Latest Ref Rng & Units 06/25/2021 06/23/2021 06/22/2021   WBC 4.0 - 10.5 K/uL 8.2 8.4 7.7  Hemoglobin 12.0 - 15.0 g/dL 12.8 12.9 12.3  Hematocrit 36.0 - 46.0 % 37.3 39.0 38.0  Platelets 150 - 400 K/uL 351 266 257    CMP Latest Ref Rng & Units 06/25/2021 06/23/2021 06/22/2021  Glucose 70 - 99 mg/dL 126(H) 115(H) 125(H)  BUN 8 - 23 mg/dL 17 23 28(H)  Creatinine 0.44 - 1.00 mg/dL 1.11(H) 1.13(H) 1.55(H)  Sodium 135 - 145 mmol/L 136 135 136  Potassium 3.5 - 5.1 mmol/L 3.0(L) 3.6 3.9  Chloride 98 - 111 mmol/L 107 106 107  CO2 22 - 32 mmol/L 20(L) 20(L) 22  Calcium 8.9 - 10.3 mg/dL 8.9 8.8(L) 9.0  Total Protein 6.5 - 8.1 g/dL - - -  Total Bilirubin 0.3 - 1.2 mg/dL - - -  Alkaline Phos 38 - 126 U/L - - -  AST 15 - 41 U/L - - -  ALT 0 - 44 U/L - - -    Imaging: No results found.  Assessment/Plan:   Active Problems:   Diabetes mellitus type 2 with neurological manifestations ([place])   Pure hypercholesterolemia   Essential hypertension, benign   Weakness due to old stroke   Cerebral thrombosis with cerebral infarction   COVID-19   Patient Summary: Jade [name] is a 65 y.o. with pertinent PMH HTN, HLD, T2DM, former smoker ,multiple CVA with recent CVA in August , and left carotid artery stenosis s/p left transcarotid artery revascularization (04/06/2021) ,who presented with weakness and  admitted for subacute stroke  Subacute CVA Weakness Patient doing well, BP goal of 130-150 systolic.  On low dose losartan 12.5 mg, and carvedilol 6.25 mg. We will continue to watch blood pressures closely - Continue to control modifiable risk factors: HTN, HLD, T2DM - Continue aspirin and Brilinta. - Rehab in a CIR no longer appropriate as patient would not be able to tolerate 3 hrs therapy daily.  We will now pursue SNF.  THis was explained to her.    Hypertension -Blood pressure has ranged from 112/67 to 168/83. Goal of 130-150 systolic.  Continue carvedilol and continue low-dose losartan.     Hyperlipidemia - Continue Atorvastatin 80 mg    Diarrhea incontinence, etiology unknown.  Ongoing, scheduling imodium to 4 g BID rather than PRN, will reassess and continue to titrate   Depressed Mood Patient has depressed moods. States she is missing her family and would like to she her son. Appears isolation from Covid also contributing. Will reach out to family and encourage them to visit now that her isolation has ended. -Continue to monitor.    Type 2 Diabetes Mellitus  - Hemoglobin A1c 6.5 03/2021, glucose level stable.  Continue SSI   AKI (resolved)  CKD Stage 3a (stable) -Baseline creatinine 1.1-1.3. Creatinine 1.11 on 06/25/21.   Covid-19; resolved - off precautions   Diet: Carb-Modified IVF: None,None VTE: Enoxaparin Code: DNR PT/OT recs: SNF for Subacute PT, .  Prior to Admission Living Arrangement: Home Anticipated Discharge Location: SNF Barriers to Discharge: Placement Dispo: Anticipated discharge to Skilled nursing facility in 2 days pending placement.   Ilene Qua, MD Internal Medicine Resident PGY-1 Pager [phone] Please contact the on call pager after 5 pm and on weekends at [phone].

## 2021-06-27 NOTE — Care Management Important Message (Signed)
Important Message  Patient Details  Name: MADDI COLLAR MRN: [id number] Date of Birth: [birth year]   Medicare Important Message Given:  Yes     Dorena Bodo 06/27/2021, 2:18 PM

## 2021-06-27 NOTE — Progress Notes (Addendum)
Physical Therapy Treatment Patient Details Name: Jade [name] MRN: [id number] DOB: 10-10-1955 Today's Date: 06/27/2021   History of Present Illness 65 y.o. female admitted 10/22 with increased bil weakness. 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  PMH: arthritis, CKD, COPD, CVA Aug 2022 with revascularization, fibromyalgia, gout, HTN, migraine, OSA, DM2    PT Comments    Pt pleasant with report of maintained lack of appetite. Pt with incontinent stool and required repeated standing for pericare with pt demonstrating increased transfer ability with standing with 2 person assist and use of stedy today. Pt continues to require max assist for transition from supine to sit but less left hip pain with activity today. Will continue to follow     Recommendations for follow up therapy are one component of a multi-disciplinary discharge planning process, led by the attending physician.  Recommendations may be updated based on patient status, additional functional criteria and insurance authorization.  Follow Up Recommendations  Skilled nursing-short term rehab (<3 hours/day)     Assistance Recommended at Discharge Intermittent Supervision/Assistance  Equipment Recommendations  Other (comment) (hoyer lift)    Recommendations for Other Services       Precautions / Restrictions Precautions Precautions: Fall;Other (comment) Precaution Comments: Hx of CVA with R hemi     Mobility  Bed Mobility Overal bed mobility: Needs Assistance Bed Mobility: Rolling;Sidelying to Sit Rolling: Mod assist Sidelying to sit: Max assist       General bed mobility comments: pt able to roll right with use of rail and pad mod assist with increased time and effort. Mod assist with cues to hook RLE with LLE to clear surface with assist to pivot pelvis and elevate trunk. Posterior left lean in  sitting    Transfers Overall transfer level: Needs assistance   Transfers: Sit to/from Stand Sit to Stand: Min assist;Mod assist;+2 physical assistance           General transfer comment: mod +2 assist with use of belt and pad clearing RUE as not to cause pain and pt needing cues to use LUE for support and stability to assist with standing x 2. Pt then stood with stedy x 3 (2 from bed and 1 from stedy pad) with min assist for anterior translation and hip extension. pt maintained standing grossly 90 sec then 50 sec for pericare. Pivot bed to chair via stedy Transfer via Lift Equipment: Stedy  Ambulation/Gait             General Gait Details: unable at baseline   Stairs             Wheelchair Mobility    Modified Rankin (Stroke Patients Only)       Balance Overall balance assessment: Needs assistance Sitting-balance support: No upper extremity supported;Feet supported Sitting balance-Leahy Scale: Poor Sitting balance - Comments: posterior left lean in sitting today with cues and guarding   Standing balance support: Single extremity supported Standing balance-Leahy Scale: Poor Standing balance comment: external support and LUE support on stedy                            Cognition Arousal/Alertness: Awake/alert Behavior During Therapy: Flat affect Overall Cognitive Status: Impaired/Different from baseline Area of Impairment: Following commands;Problem solving  Following Commands: Follows one step commands with increased time     Problem Solving: Slow processing;Requires verbal cues          Exercises      General Comments        Pertinent Vitals/Pain Pain Score: 4  Pain Location: RUE with movement, decreased pain Left hip Pain Descriptors / Indicators: Aching;Guarding Pain Intervention(s): Limited activity within patient's tolerance;Monitored during session;Repositioned    Home Living                           Prior Function            PT Goals (current goals can now be found in the care plan section) Progress towards PT goals: Progressing toward goals    Frequency    Min 3X/week      PT Plan Discharge plan needs to be updated    Co-evaluation              AM-PAC PT "6 Clicks" Mobility   Outcome Measure  Help needed turning from your back to your side while in a flat bed without using bedrails?: A Lot Help needed moving from lying on your back to sitting on the side of a flat bed without using bedrails?: A Lot Help needed moving to and from a bed to a chair (including a wheelchair)?: Total Help needed standing up from a chair using your arms (e.g., wheelchair or bedside chair)?: Total Help needed to walk in hospital room?: Total Help needed climbing 3-5 steps with a railing? : Total 6 Click Score: 8    End of Session Equipment Utilized During Treatment: Gait belt Activity Tolerance: Patient tolerated treatment well Patient left: in chair;with call bell/phone within reach;with chair alarm set Nurse Communication: Mobility status;Need for lift equipment (stedy for transfers with <2hrs sitting) PT Visit Diagnosis: Muscle weakness (generalized) (M62.81);Unsteadiness on feet (R26.81);Difficulty in walking, not elsewhere classified (R26.2)     Time: 1601-0932 PT Time Calculation (min) (ACUTE ONLY): 26 min  Charges:  $Therapeutic Activity: 23-37 mins                     Airelle Everding P, PT Acute Rehabilitation Services Pager: [phone] Office: [phone]    Zay Yeargan B Stillman Buenger 06/27/2021, 11:07 AM

## 2021-06-27 NOTE — Plan of Care (Signed)
  Problem: Education: Goal: Knowledge of General Education information will improve Description: Including pain rating scale, medication(s)/side effects and non-pharmacologic comfort measures Outcome: Progressing   Problem: Health Behavior/Discharge Planning: Goal: Ability to manage health-related needs will improve Outcome: Progressing   Problem: Clinical Measurements: Goal: Ability to maintain clinical measurements within normal limits will improve Outcome: Progressing Goal: Will remain free from infection Outcome: Progressing Goal: Diagnostic test results will improve Outcome: Progressing Goal: Respiratory complications will improve Outcome: Progressing Goal: Cardiovascular complication will be avoided Outcome: Progressing   Problem: Activity: Goal: Risk for activity intolerance will decrease Outcome: Progressing   Problem: Nutrition: Goal: Adequate nutrition will be maintained Outcome: Progressing   Problem: Coping: Goal: Level of anxiety will decrease Outcome: Progressing   Problem: Elimination: Goal: Will not experience complications related to bowel motility Outcome: Progressing Goal: Will not experience complications related to urinary retention Outcome: Progressing   Problem: Pain Managment: Goal: General experience of comfort will improve Outcome: Progressing   Problem: Safety: Goal: Ability to remain free from injury will improve Outcome: Progressing   Problem: Skin Integrity: Goal: Risk for impaired skin integrity will decrease Outcome: Progressing   Problem: Education: Goal: Knowledge of risk factors and measures for prevention of condition will improve Outcome: Progressing   Problem: Coping: Goal: Psychosocial and spiritual needs will be supported Outcome: Progressing   Problem: Respiratory: Goal: Will maintain a patent airway Outcome: Progressing Goal: Complications related to the disease process, condition or treatment will be avoided or  minimized Outcome: Progressing   Problem: Education: Goal: Knowledge of disease or condition will improve Outcome: Progressing Goal: Knowledge of secondary prevention will improve (SELECT ALL) Outcome: Progressing Goal: Knowledge of patient specific risk factors will improve (INDIVIDUALIZE FOR PATIENT) Outcome: Progressing   Problem: Coping: Goal: Will verbalize positive feelings about self Outcome: Progressing   Problem: Health Behavior/Discharge Planning: Goal: Ability to manage health-related needs will improve Outcome: Progressing   Problem: Self-Care: Goal: Ability to participate in self-care as condition permits will improve Outcome: Progressing   Problem: Ischemic Stroke/TIA Tissue Perfusion: Goal: Complications of ischemic stroke/TIA will be minimized Outcome: Progressing

## 2021-06-27 NOTE — Progress Notes (Signed)
HD#11 Subjective:  Overnight Events: NAEO   Patient says that the diarrhea is doing better she only had one episode of it last night. She was resting comfortably on exam and went back to sleep afterwards.  Objective:  Vital signs in last 24 hours: Vitals:   06/27/21 1646 06/27/21 1958 06/27/21 2307 06/28/21 0401  BP: 115/66 128/70 (!) 120/59 (!) 144/68  Pulse: 82 71 72 77  Resp:  17 16 15   Temp:  97.7 F (36.5 C) (!) 97.5 F (36.4 C) (!) 97.5 F (36.4 C)  TempSrc:  Oral    SpO2:  99% 100% 99%  Weight:      Height:       Supplemental O2: Room Air SpO2: 99 %   Physical Exam:  Constitutional: elderly woman sleeping comfortably in bed in no acute distress HENT: normocephalic atraumatic, mucous membranes moist Eyes: conjunctiva non-erythematous Neck: supple Cardiovascular: regular rate and rhythm, no m/r/g Pulmonary/Chest: normal work of breathing on room air, lungs clear to auscultation bilaterally Abdominal: soft, non-tender, non-distended, normal bowel sound pattern MSK: normal bulk and tone Neurological: sleeping but wake up and answers questions appropriately Skin: Keratotic papule on the parietal scalp with some hemmorragic crusting along the base Psych: normal affect    Filed Weights   06/17/21 1333 06/18/21 0042  Weight: 77.1 kg 72.6 kg     Intake/Output Summary (Last 24 hours) at 06/28/2021 0538 Last data filed at 06/27/2021 1300 Gross per 24 hour  Intake 220 ml  Output 400 ml  Net -180 ml   Net IO Since Admission: -1,310.17 mL [06/28/21 0538]  Pertinent Labs: CBC Latest Ref Rng & Units 06/25/2021 06/23/2021 06/22/2021  WBC 4.0 - 10.5 K/uL 8.2 8.4 7.7  Hemoglobin 12.0 - 15.0 g/dL 12.8 12.9 12.3  Hematocrit 36.0 - 46.0 % 37.3 39.0 38.0  Platelets 150 - 400 K/uL 351 266 257    CMP Latest Ref Rng & Units 06/28/2021 06/27/2021 06/25/2021  Glucose 70 - 99 mg/dL 129(H) 148(H) 126(H)  BUN 8 - 23 mg/dL 30(H) 31(H) 17  Creatinine 0.44 - 1.00 mg/dL  1.54(H) 1.49(H) 1.11(H)  Sodium 135 - 145 mmol/L 135 136 136  Potassium 3.5 - 5.1 mmol/L 3.4(L) 3.0(L) 3.0(L)  Chloride 98 - 111 mmol/L 108 106 107  CO2 22 - 32 mmol/L 20(L) 20(L) 20(L)  Calcium 8.9 - 10.3 mg/dL 9.2 9.2 8.9  Total Protein 6.5 - 8.1 g/dL - - -  Total Bilirubin 0.3 - 1.2 mg/dL - - -  Alkaline Phos 38 - 126 U/L - - -  AST 15 - 41 U/L - - -  ALT 0 - 44 U/L - - -    Imaging: No results found.  Assessment/Plan:   Active Problems:   Diabetes mellitus type 2 with neurological manifestations ([place])   Pure hypercholesterolemia   Essential hypertension, benign   Weakness due to old stroke   Cerebral thrombosis with cerebral infarction   COVID-19   Patient Summary: Jade [name] is a 65 y.o. with pertinent PMH HTN, HLD, T2DM, former smoker ,multiple CVA with recent CVA in August , and left carotid artery stenosis s/p left transcarotid artery revascularization (04/06/2021) ,who presented with weakness and admitted for subacute stroke   Subacute CVA Weakness Patient doing well, BP goal of [id number] systolic.  On low dose losartan 12.5 mg, and carvedilol 6.25 mg. We will continue to watch blood pressures closely.Spoke with family on the phone yesterday who said they would be unable to care for  her by themselves with her functional state as it is. Will continue to pursue SNF - Continue to control modifiable risk factors: HTN, HLD, T2DM - Continue aspirin and Brilinta. - F/U SNF placement   Hypertension -Blood pressure has ranged from 112/67 to 168/83. Goal of 130-150 systolic.  Continue carvedilol and continue low-dose losartan.     Hyperlipidemia - Continue Atorvastatin 80 mg   Diarrhea incontinence, etiology unknown; improving  Ongoing, scheduling imodium to 4 g BID rather than PRN. As of 11/2 this has improved, will continue to monitor   Depressed Mood Patient has depressed moods. States she is missing her family and would like to she her son. Appears isolation from  Covid also contributing. Will reach out to family and encourage them to visit now that her isolation has ended. -Continue to monitor.    Type 2 Diabetes Mellitus  - Hemoglobin A1c 6.5 03/2021, glucose level stable.  Continue SSI  Hypokalemia Potassium 3.4 on 11/2, given a one time dose repletion of 20 meq - BMP tomorrow   AKI (resolved)   CKD Stage 3a (stable) -Baseline creatinine 1.1-1.3. Creatinine 1.11 on 06/25/21.    Covid-19; resolved - off precautions   Diet: Carb-Modified IVF: None,None VTE: Enoxaparin Code: DNR PT/OT recs: SNF for Subacute PT, .   Prior to Admission Living Arrangement: Home Anticipated Discharge Location: SNF Barriers to Discharge: Placement Dispo: Anticipated discharge to Skilled nursing facility in 2 days pending placement.   Ilene Qua, MD Internal Medicine Resident PGY-1 Pager [phone] Please contact the on call pager after 5 pm and on weekends at [phone].

## 2021-06-27 NOTE — Progress Notes (Signed)
Inpatient Rehab Admissions Coordinator:   AIR/IRF prior auth denied for [facility], Pt. Does not want to pursue appeal and is pursuing short term rehab at [facility]. CIR will sign off.   Megan Salon, MS, CCC-SLP Rehab Admissions Coordinator  [phone] (celll) [phone] (office)

## 2021-06-28 LAB — BASIC METABOLIC PANEL
Anion gap: 7 (ref 5–15)
BUN: 30 mg/dL — ABNORMAL HIGH (ref 8–23)
CO2: 20 mmol/L — ABNORMAL LOW (ref 22–32)
Calcium: 9.2 mg/dL (ref 8.9–10.3)
Chloride: 108 mmol/L (ref 98–111)
Creatinine, Ser: 1.54 mg/dL — ABNORMAL HIGH (ref 0.44–1.00)
GFR, Estimated: 37 mL/min — ABNORMAL LOW (ref >60)
Glucose, Bld: 129 mg/dL — ABNORMAL HIGH (ref 70–99)
Potassium: 3.4 mmol/L — ABNORMAL LOW (ref 3.5–5.1)
Sodium: 135 mmol/L (ref 135–145)

## 2021-06-28 LAB — GLUCOSE, CAPILLARY
Glucose-Capillary: 148 mg/dL — ABNORMAL HIGH (ref 70–99)
Glucose-Capillary: 146 mg/dL — ABNORMAL HIGH (ref 70–99)
Glucose-Capillary: 136 mg/dL — ABNORMAL HIGH (ref 70–99)
Glucose-Capillary: 163 mg/dL — ABNORMAL HIGH (ref 70–99)
Glucose-Capillary: 146 mg/dL — ABNORMAL HIGH (ref 70–99)

## 2021-06-28 MED ORDER — POTASSIUM CHLORIDE CRYS ER 20 MEQ PO TBCR
20.0000 meq | EXTENDED_RELEASE_TABLET | Freq: Once | ORAL | Status: AC
  Administered 2021-06-28: 20 meq via ORAL
  Filled 2021-06-28: qty 1

## 2021-06-28 NOTE — TOC Progression Note (Addendum)
Transition of Care [facility]) - Progression Note    Patient Details  Name: Jade [name] MRN: [id number] Date of Birth: 1955/10/19  Transition of Care [facility]) CM/SW Contact  Lorri Frederick, LCSW Phone Number: 06/28/2021, 10:46 AM  Clinical Narrative:    CSW received message from son Jade [name].  Pt husband has covid, "doesn't want to go somewhere for 5 days."  CSW attempted to return call twice this morning, no answer, LM.  CSW spoke with [facility] admitting--they looked pt up in medicaid system and pt does not have medicaid, only medicare.  CSW spoke with Amy at [facility].  Pt is active with them, informed her of potential DC today.  1430: CSW spoke with son Jade [name].  Jade [name], pt's husband, was sent home from work yesterday after testing positive for covid.  Jade [name] told Jade [name] that no one was allowed in the house until his 5 day quarantine was over.  Jade [name] has another place to stay but reports that Jade [name] has not responded to any calls today.  1500: CSW attempted to call [place] with assistance of [employer] twice, no answer, mailbox full.  CSW did send a text to this number requesting call back. 1515: CSW updated TOC supervisor.  Will request welfare check for police to request pt to call the hospital.   1520: CSW spoke with pt update her on all this.    1525: CSW spoke with [employer] and requested wellfare check/visit.    1700: CSW received call from [facility] at the home, talking to husband Jade [name], who was speaking some english.  CSW explained need for pt to return home, need for equipment to be delivered, CSW stayed on line while officer explained this to pt husband.  Husband agreed to allow wife to return tomorrow, officer informed him that CSW will coordinate with son and son will communicate with him.      Expected Discharge Plan: IP Rehab Facility Barriers to Discharge: Continued Medical Work up  Expected Discharge Plan and Services Expected  Discharge Plan: IP Rehab Facility In-house Referral: Clinical Social Work   Post Acute Care Choice: IP Rehab Living arrangements for the past 2 months: Single Family Home                                       Social Determinants of Health (SDOH) Interventions    Readmission Risk Interventions No flowsheet data found.

## 2021-06-28 NOTE — Discharge Summary (Signed)
Name: [name] MRN: [id number] DOB: [birth year] 65 y.o. PCP: Leonard Downing  Date of Admission: 06/17/2021 12:55 PM Date of Discharge: 06/30/21 Attending Physician: Miguel Aschoff, MD  Discharge Diagnosis: 1. Subacute bilateral corpus callosal ischemic strokes 2. 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 Type 2 DM  4. Essential HTN 5. Asymptomatic COVID-19 6. Hyperlipidemia 7. Diarrhea incontinence 8. Acute kidney injury 9. CKD stage 3a 10. Hypokalemia  Discharge Medications: Allergies as of 06/30/2021       Reactions   Baclofen Other (See Comments)   Pt said felt weaker and had slurred speech   Nickel Dermatitis, Rash        Medication List     STOP taking these medications    amLODipine 10 MG tablet Commonly known as: NORVASC   diclofenac Sodium 1 % Gel Commonly known as: VOLTAREN   docusate sodium 100 MG capsule Commonly known as: COLACE   glimepiride 1 MG tablet Commonly known as: AMARYL   hydrochlorothiazide 12.5 MG tablet Commonly known as: HYDRODIURIL   lisinopril 10 MG tablet Commonly known as: ZESTRIL   loperamide 2 MG tablet Commonly known as: IMODIUM A-D Replaced by: loperamide 2 MG capsule   magnesium oxide 400 (240 Mg) MG tablet Commonly known as: MAG-OX   Natural Vitamin D-3 125 MCG (5000 UT) Tabs Generic drug: Cholecalciferol   polyethylene glycol 17 g packet Commonly known as: MIRALAX / GLYCOLAX   tiZANidine 2 MG tablet Commonly known as: ZANAFLEX   Vitamin B Complex Tabs       TAKE these medications    acetaminophen 325 MG tablet Commonly known as: TYLENOL Take 2 tablets (650 mg total) by mouth every 6 (six) hours as needed for mild pain or headache (or temp > 37.5 C (99.5 F)).   aspirin EC 81 MG tablet Take 1 tablet (81 mg total) by mouth every morning for 24 days. Swallow whole. What changed: Another medication with the same name was changed. Make sure you understand how and  when to take each.   aspirin 325 MG EC tablet Take 1 tablet (325 mg total) by mouth daily. Start taking on: July 23, 2021 What changed: These instructions start on July 23, 2021. If you are unsure what to do until then, ask your doctor or other care provider.   atorvastatin 80 MG tablet Commonly known as: LIPITOR Take 1 tablet (80 mg total) by mouth daily.   carvedilol 6.25 MG tablet Commonly known as: COREG Take 1 tablet (6.25 mg total) by mouth 2 (two) times daily with a meal.   clopidogrel 75 MG tablet Commonly known as: PLAVIX Take 1 tablet (75 mg total) by mouth every morning. Start taking on: July 23, 2021 What changed: These instructions start on July 23, 2021. If you are unsure what to do until then, ask your doctor or other care provider.   dantrolene 50 MG capsule Commonly known as: DANTRIUM Take 1 capsule (50 mg total) by mouth at bedtime.   loperamide 2 MG capsule Commonly known as: IMODIUM Take 2 capsules (4 mg total) by mouth 2 (two) times daily as needed for diarrhea or loose stools. Replaces: loperamide 2 MG tablet   losartan 25 MG tablet Commonly known as: COZAAR Take 0.5 tablets (12.5 mg total) by mouth daily.   magnesium oxide 400 MG tablet Commonly known as: MAG-OX Take 2 tablets (800 mg total) by mouth at bedtime. What changed: how much to take   pantoprazole 40 MG  tablet Commonly known as: PROTONIX Take 1 tablet (40 mg total) by mouth daily.   tamsulosin 0.4 MG Caps capsule Commonly known as: FLOMAX Take 1 capsule (0.4 mg total) by mouth daily after supper.   ticagrelor 90 MG Tabs tablet Commonly known as: BRILINTA Take 1 tablet (90 mg total) by mouth 2 (two) times daily for 24 days.               Durable Medical Equipment  (From admission, onward)           Start     Ordered   06/29/21 1348  For home use only DME Hospital bed  Once       Comments: Patient unable to reposition herself without help and family  will not be able to do so without this equipment  Question Answer Comment  Length of Need Lifetime   Patient has (list medical condition): Ischemic stroke, right foot drop, type 2 diabetes, CKD 3, transient ischemic attacks   The above medical condition requires: Patient requires the ability to reposition frequently   Bed type Semi-electric   Hoyer Lift Yes      06/29/21 1348            Disposition and follow-up:   [name] was discharged from [facility] in Stable condition.  At the hospital follow up visit please address:  1.  Subacute CVA/punctate CVAs- assess ability to complete ADLs/iADLs as well as functional status. Per neurology recs patient is supposed to be on brillenta and aspirin 81 for 1 month (end 11/27) and then start aspirin 325 and plavix (11/27) for two months before transitioning to just plavix.  2. CKD 3a- assess creatinine as well as urine output  3. Diarrhea- patient discharged on imodium 4 mg BID, reassess dosage and titrate or discontinue as needed   4. Hypertension- assess BP in the outpatient setting, medications adjusted to carvedilol 6.25 BID and  losartan 12.5 Amlodipine 10 and lisonpril 10 and HCTZ 12.5 at home  5. Hypokalemia- Normal at discharge, recheck on bmp  6. Diabetes- Address need to restart glimipride  6. Skin lesion- papule on the scalp that is concerning for skin cancer, recommend dermatology follow up  7.  Labs / imaging needed at time of follow-up: BMP  8.  Pending labs/ test needing follow-up: None  Follow-up Appointments:  Follow-up Information     Garvin Fila, MD. Schedule an appointment as soon as possible for a visit in 1 month(s).   Specialties: Neurology, Radiology Why: stroke clinic Contact information: [place] [address] [place] problem list:  1. 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 Most likely in the setting of non-adherence as patient states she was out of all her medications for about 2-3 weeks. Echo  obtained showed the decreased EF and was concerning for a LV thrombus. She was initially started on heparin gtt but  cardiology consulted and they did not believe there was a thrombus so heparin was discontinued. Patient became hypotensive on 10/26 and a code stroke was called due to mental status change, CTA obtained was negative for acute findings but MRI revealed three new punctate ischemic infarcts involving the subcortical left parietooccipital region and bilateral hippocampi. No associated hemorrhage or  mass effect seen. Severe stenosis bilateral P2 and bilateral MCA branch vessels was also noted on CTA. No new neuro deficit seen during physical exam. Prior to hospitalization patient was on aspirin 81 mg daily and clopidogrel 75 mg daily. This was changed to on ASA 81 and brilinta 90 twice daily for 1 month and then aspirin 325 and Plavix 75 for additional 2 months and then Plavix alone given severe intracranial stenosis. CIR vs SNF was considered but deemed that patient would have difficulty qualifying for these and patient was sent home with home health PT/OT.  2. Essential HTN Home medications are amlodipine 10 mg, lisinopril 10 mg, HCTZ 12.5, however patient was apparently not taking these for several weeks. Upon resuming her BP meds patient had an episode of hypotension as above and regimen was adjusted. Patient is now on cozar 6.25 and losartan 12.5  3. Type 2 DM  HbA1c 5.7 06/18/21 CBGs mostly <180 and did well on sliding scale. Did not restart home glimipiride on DC,  will defer to PCP.  4. Asymptomatic COVID-19 Patient had no symptoms and maintained oxygen saturation. No interventions were required.  5. Hyperlipidemia Lipid panel  showing cholesterol 201, and LDL 133. Continued Atorvastatin 80 mg.  6. Diarrhea incontinence Improved on loperamide 4 BID.  7. Acute kidney injury on CKD 3a Patient had an AKI after BP drop causing hypoperfusion, Creatinine normalized to 1.1-1.3 which was patient's baseline.    Discharge Exam:   BP (!) 146/70 (BP Location: Left Arm)   Pulse 66   Temp 97.8 F (36.6 C)   Resp 15   Ht 5\' 6"  (1.676 m)   Wt 72.6 kg   SpO2 100%   BMI 25.83 kg/m  Discharge exam:  Constitutional: elderly woman in no acute distress HENT: normocephalic atraumatic, mucous membranes moist Eyes: conjunctiva non-erythematous Neck: supple Cardiovascular: regular rate and rhythm, no m/r/g Pulmonary/Chest: normal work of breathing on room air, lungs clear to auscultation bilaterally Abdominal: soft, non-tender, non-distended MSK: contracture and weakness of the RUE Neurological: salert and answers questions appropriately Skin: Keratotic papule on the parietal scalp with some hemmorragic crusting along the base Psych: normal affect  Pertinent Labs, Studies, and Procedures:  CBC Latest Ref Rng & Units 06/25/2021 06/23/2021 06/22/2021  WBC 4.0 - 10.5 K/uL 8.2 8.4 7.7  Hemoglobin 12.0 - 15.0 g/dL 12.8 12.9 12.3  Hematocrit 36.0 - 46.0 % 37.3 39.0 38.0  Platelets 150 - 400 K/uL 351 266 257    Lab Results  Component Value Date   NA 136 06/30/2021   K 4.2 06/30/2021   CO2 22 06/30/2021   GLUCOSE 142 (H) 06/30/2021   BUN 26 (H) 06/30/2021   CREATININE 1.23 (H) 06/30/2021   CALCIUM 9.4 06/30/2021   GFRNONAA 48 (L) 06/30/2021     Discharge Instructions: Discharge Instructions     Ambulatory referral to Neurology   Complete by: As directed    Follow up with Dr. Leonie Man at [facility] in 4-6 weeks. Too complicated for NP to follow. Thanks.   Call MD for:  persistant nausea and vomiting   Complete by: As directed    Call MD for:  severe uncontrolled pain   Complete by: As directed    Call MD for:  temperature  >100.4   Complete by: As directed    Diet Carb Modified   Complete by: As directed    Increase activity slowly   Complete by: As directed        Signed: Darien Mignogna,  MD 06/30/2021, 12:27 PM   Pager: [phone]

## 2021-06-29 LAB — GLUCOSE, CAPILLARY
Glucose-Capillary: 126 mg/dL — ABNORMAL HIGH (ref 70–99)
Glucose-Capillary: 130 mg/dL — ABNORMAL HIGH (ref 70–99)
Glucose-Capillary: 139 mg/dL — ABNORMAL HIGH (ref 70–99)
Glucose-Capillary: 165 mg/dL — ABNORMAL HIGH (ref 70–99)

## 2021-06-29 LAB — BASIC METABOLIC PANEL
Anion gap: 8 (ref 5–15)
BUN: 29 mg/dL — ABNORMAL HIGH (ref 8–23)
CO2: 20 mmol/L — ABNORMAL LOW (ref 22–32)
Calcium: 9.4 mg/dL (ref 8.9–10.3)
Chloride: 110 mmol/L (ref 98–111)
Creatinine, Ser: 1.32 mg/dL — ABNORMAL HIGH (ref 0.44–1.00)
GFR, Estimated: 45 mL/min — ABNORMAL LOW (ref >60)
Glucose, Bld: 155 mg/dL — ABNORMAL HIGH (ref 70–99)
Potassium: 4 mmol/L (ref 3.5–5.1)
Sodium: 138 mmol/L (ref 135–145)

## 2021-06-29 NOTE — Progress Notes (Signed)
Occupational Therapy Treatment Patient Details Name: Jade [name] MRN: [id number] DOB: [birth year] Today's Date: 06/29/2021   History of present illness 65 y.o. female admitted 10/22 with increased bil weakness. 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  PMH: arthritis, CKD, COPD, CVA Aug 2022 with revascularization, fibromyalgia, gout, HTN, migraine, OSA, DM2   OT comments  Patient received in recliner and asked to return to bed.  Patient was assisted back to bed with Stedy and +2 assist to transfer and to get supine.  PROM performed to RUE with pain noted during shoulder flexion and finger flexion/extension. Palm guard provided to address finger extension and nursing notified on wear time.  Acute OT to continue to follow.    Recommendations for follow up therapy are one component of a multi-disciplinary discharge planning process, led by the attending physician.  Recommendations may be updated based on patient status, additional functional criteria and insurance authorization.    Follow Up Recommendations  Acute inpatient rehab (3hours/day)    Assistance Recommended at Discharge Frequent or constant Supervision/Assistance  Equipment Recommendations  Other (comment)    Recommendations for Other Services Rehab consult    Precautions / Restrictions Precautions Precautions: Fall;Other (comment) Precaution Comments: Hx of CVA with R hemi Restrictions Weight Bearing Restrictions: No       Mobility Bed Mobility Overal bed mobility: Needs Assistance Bed Mobility: Supine to Sit     Supine to sit: Mod assist;+2 for physical assistance     General bed mobility comments: assistance needed with trunk and BLEs    Transfers Overall transfer level: Needs assistance Equipment used: 2 person hand held assist Transfers: Sit to/from Stand Sit to Stand: Min assist;Mod assist;+2  physical assistance Stand pivot transfers: Max assist;+2 physical assistance;+2 safety/equipment         General transfer comment: mod assist +2 to stand into stedy Transfer via Lift Equipment: Stedy   Balance Overall balance assessment: Needs assistance Sitting-balance support: No upper extremity supported;Feet supported Sitting balance-Leahy Scale: Poor Sitting balance - Comments: required assistance for sitting balance on EOB Postural control: Posterior lean;Right lateral lean Standing balance support: Single extremity supported Standing balance-Leahy Scale: Poor Standing balance comment: used stedy                           ADL either performed or assessed with clinical judgement   ADL Overall ADL's : Needs assistance/impaired                                             Vision       Perception     Praxis      Cognition Arousal/Alertness: Awake/alert Behavior During Therapy: Flat affect Overall Cognitive Status: Impaired/Different from baseline Area of Impairment: Following commands;Problem solving                 Orientation Level: Disoriented to;Time Current Attention Level: Sustained Memory: Decreased short-term memory Following Commands: Follows one step commands with increased time   Awareness: Emergent Problem Solving: Slow processing;Requires verbal cues General Comments: patient appeared to be in good spirits this session          Exercises Exercises: General Upper Extremity General Exercises - Upper Extremity Shoulder Flexion: PROM;Right;10 reps Shoulder ABduction: PROM;Right;10 reps Elbow Flexion:  PROM;Right;10 reps Elbow Extension: PROM;Right;10 reps Wrist Flexion: PROM;Right;10 reps Wrist Extension: PROM;Right;10 reps Digit Composite Flexion: PROM;Right;10 reps   Shoulder Instructions       General Comments      Pertinent Vitals/ Pain       Pain Assessment: Faces Faces Pain Scale: Hurts even  more Pain Location: RUE with movement, decreased pain Left hip Pain Descriptors / Indicators: Aching;Guarding Pain Intervention(s): Repositioned  Home Living                                          Prior Functioning/Environment              Frequency  Min 2X/week        Progress Toward Goals  OT Goals(current goals can now be found in the care plan section)  Progress towards OT goals: Progressing toward goals  Acute Rehab OT Goals OT Goal Formulation: With patient Time For Goal Achievement: 07/02/21 Potential to Achieve Goals: Fair ADL Goals Pt Will Perform Grooming: with set-up;standing;sitting Pt/caregiver will Perform Home Exercise Program: Increased strength;Left upper extremity;With theraband;With written HEP provided;With Supervision Additional ADL Goal #1: Pt will increase to tolerate x6 mins of OOB ADL tasks with minA overall. Additional ADL Goal #2: Student will tolerate standing x1 min with use of stedy or least restrictive AD with maxA overall.  Plan Discharge plan remains appropriate;Frequency remains appropriate    Co-evaluation                 AM-PAC OT "6 Clicks" Daily Activity     Outcome Measure   Help from another person eating meals?: A Little Help from another person taking care of personal grooming?: A Little Help from another person toileting, which includes using toliet, bedpan, or urinal?: Total Help from another person bathing (including washing, rinsing, drying)?: A Lot Help from another person to put on and taking off regular upper body clothing?: A Lot Help from another person to put on and taking off regular lower body clothing?: Total 6 Click Score: 12    End of Session Equipment Utilized During Treatment: Other (comment) (stedy)  OT Visit Diagnosis: Unsteadiness on feet (R26.81);Muscle weakness (generalized) (M62.81);Hemiplegia and hemiparesis Hemiplegia - Right/Left: Right Hemiplegia -  dominant/non-dominant: Dominant Hemiplegia - caused by: Cerebral infarction   Activity Tolerance Patient tolerated treatment well   Patient Left in bed;with call bell/phone within reach;with bed alarm set   Nurse Communication Other (comment) (informed on palm guard)        Time: 1339-1416 OT Time Calculation (min): 37 min  Charges: OT General Charges $OT Visit: 1 Visit OT Treatments $Therapeutic Activity: 8-22 mins $Neuromuscular Re-education: 8-22 mins  Alfonse Flavors, OTA Acute Rehabilitation Services  Pager [phone] Office [phone]   Dewain Penning 06/29/2021, 3:17 PM

## 2021-06-29 NOTE — Discharge Instructions (Addendum)
Ms. Naraya Stoneberg  You were recently hospitalized at [facility] for a stroke. We also adjusted your blood pressure medications and treated your diarrhea. You developed an asymptomatic COVID infection during your stay but you required no treatment and are out of your isolation period. You can return to your normal activities as tolerated.  Stroke- your medications have changed. You will take Brilinta 90 mg twice a day as well as apirin 81 mg for a total of one month starting from when you were in the hospital. This will end on 11/27. On 11/27 You will switch to plavix 75 and aspirin 325 for two months. After two months you will stop taking the aspirin and just continue plavix. Talk to your primary doctor about this to make sure you understand how to change these medications.  2. Blood pressure- Your blood pressure medicines have changes. You will now take coreg 6.25 twice a day as well as losartan 25 once a day. Stop taking your lisinopril HCTZ and your amlodipine  3. Diabetes- Your sugars have been good without medication while in the hospital. Do not take your glimepiride until you talk to your primary care doctor.  4. Diarrhea- We have been giving you imodium twice a day to help prevent your diarrhea. If you get constipated you can decrease this dose to once a day or stop entirely. Take this only as needed.  If you start to have another episode of weakness on one side of your body, confusion, difficulty speaking, or a facial droop we recommend that you seek emergency medical care.

## 2021-06-29 NOTE — Progress Notes (Signed)
Physical Therapy Treatment Patient Details Name: Jade [name] MRN: [id number] DOB: 1956-08-02 Today's Date: 06/29/2021   History of Present Illness 65 y.o. female admitted 10/22 with increased bil weakness. 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  PMH: arthritis, CKD, COPD, CVA Aug 2022 with revascularization, fibromyalgia, gout, HTN, migraine, OSA, DM2    PT Comments    Pt progressing slowly toward goals.  Emphasis on transition to EOB with work on scooting and balance at EOB.  Sit to stand in the stedy with 2 person mod assist x3 with pre gait activity and trying to increase the time fulling upright.    Recommendations for follow up therapy are one component of a multi-disciplinary discharge planning process, led by the attending physician.  Recommendations may be updated based on patient status, additional functional criteria and insurance authorization.  Follow Up Recommendations  Skilled nursing-short term rehab (<3 hours/day)     Assistance Recommended at Discharge Intermittent Supervision/Assistance  Equipment Recommendations  Other (comment) (TBA)    Recommendations for Other Services       Precautions / Restrictions Precautions Precautions: Fall;Other (comment) Precaution Comments: Hx of CVA with R hemi Restrictions Weight Bearing Restrictions: No     Mobility  Bed Mobility Overal bed mobility: Needs Assistance Bed Mobility: Supine to Sit     Supine to sit: Mod assist;+2 for physical assistance     General bed mobility comments: cues for best technique, assist up and forward via L elbow and stability until pt got L UE in a good position to assist. Pt still had difficulty scooting forward without moderate assist    Transfers Overall transfer level: Needs assistance Equipment used: 2 person hand held assist Transfers: Sit to/from Stand Sit to  Stand: Min assist;Mod assist;+2 physical assistance Stand pivot transfers: Max assist;+2 physical assistance;+2 safety/equipment         General transfer comment: sit to stand x3 into the steady. Transfer via Lift Equipment: Stedy  Ambulation/Gait                 Stairs             Wheelchair Mobility    Modified Rankin (Stroke Patients Only)       Balance Overall balance assessment: Needs assistance Sitting-balance support: No upper extremity supported;Feet supported Sitting balance-Leahy Scale: Poor Sitting balance - Comments: relant on UE assist or external support Postural control: Posterior lean;Right lateral lean Standing balance support: Single extremity supported Standing balance-Leahy Scale: Poor Standing balance comment: used stedy.  Pt able to work on w/shifting and full upright posture.  Pt able to stand 45 or mosr secs.                            Cognition Arousal/Alertness: Awake/alert Behavior During Therapy: Flat affect Overall Cognitive Status: Impaired/Different from baseline Area of Impairment: Following commands;Problem solving                 Orientation Level: Disoriented to;Time Current Attention Level: Sustained Memory: Decreased short-term memory Following Commands: Follows one step commands with increased time   Awareness: Emergent Problem Solving: Slow processing;Requires verbal cues General Comments: patient appeared to be in good spirits this session        Exercises General Exercises - Upper Extremity Shoulder Flexion: PROM;Right;10 reps Shoulder ABduction: PROM;Right;10 reps Elbow Flexion: PROM;Right;10 reps Elbow Extension: PROM;Right;10 reps  Wrist Flexion: PROM;Right;10 reps Wrist Extension: PROM;Right;10 reps Digit Composite Flexion: PROM;Right;10 reps Other Exercises Other Exercises: hip/knee flex/ext with graded resistance x 10 rep bil    General Comments General comments (skin integrity,  edema, etc.): vss      Pertinent Vitals/Pain Pain Assessment: Faces Faces Pain Scale: Hurts even more Pain Location: RUE with movement, decreased pain Left hip Pain Descriptors / Indicators: Aching;Guarding Pain Intervention(s): Repositioned    Home Living                          Prior Function            PT Goals (current goals can now be found in the care plan section) Acute Rehab PT Goals PT Goal Formulation: With patient Time For Goal Achievement: 07/02/21 Potential to Achieve Goals: Fair Progress towards PT goals: Progressing toward goals    Frequency    Min 3X/week      PT Plan Current plan remains appropriate    Co-evaluation              AM-PAC PT "6 Clicks" Mobility   Outcome Measure  Help needed turning from your back to your side while in a flat bed without using bedrails?: A Lot Help needed moving from lying on your back to sitting on the side of a flat bed without using bedrails?: A Lot Help needed moving to and from a bed to a chair (including a wheelchair)?: Total Help needed standing up from a chair using your arms (e.g., wheelchair or bedside chair)?: Total Help needed to walk in hospital room?: Total Help needed climbing 3-5 steps with a railing? : Total 6 Click Score: 8    End of Session   Activity Tolerance: Patient tolerated treatment well;Patient limited by pain Patient left: in bed;with call bell/phone within reach;with bed alarm set Nurse Communication: Mobility status PT Visit Diagnosis: Other abnormalities of gait and mobility (R26.89);Muscle weakness (generalized) (M62.81)     Time: 3419-3790 PT Time Calculation (min) (ACUTE ONLY): 26 min  Charges:  $Therapeutic Activity: 23-37 mins                     06/29/2021  Jacinto Halim., PT Acute Rehabilitation Services [phone]  (pager) [phone]  (office)   Eliseo Gum Roselynne Lortz 06/29/2021, 5:39 PM

## 2021-06-29 NOTE — Progress Notes (Signed)
HD#12 Subjective:  Overnight Events: NAEO  Says that she slept ok last night, she is still feeling a bit sleepy this morning. Has not had any episodes of diarrhea since the night prior.  Objective:  Vital signs in last 24 hours: Vitals:   06/28/21 1756 06/28/21 2014 06/28/21 2357 06/29/21 0355  BP: (!) 139/58 130/71 129/69 (!) 111/59  Pulse: 76 74 70 60  Resp:  15 15 16   Temp:  97.9 F (36.6 C) 97.6 F (36.4 C) 97.8 F (36.6 C)  TempSrc:  Oral Oral Oral  SpO2:  98% 98% 99%  Weight:      Height:       Supplemental O2: Room Air SpO2: 99 %   Physical Exam:  Constitutional: elderly woman resting comfortably in bed in no acute distress HENT: normocephalic atraumatic, mucous membranes moist Eyes: conjunctiva non-erythematous Neck: supple Cardiovascular: regular rate and rhythm, no m/r/g Pulmonary/Chest: normal work of breathing on room air, lungs clear to auscultation bilaterally Abdominal: soft, non-tender, non-distended, normal bowel sound pattern MSK: contracture and weakness of the RUE Neurological: alert and answers questions appropriately Skin: Keratotic papule on the parietal scalp with some hemmorragic crusting along the base Psych: mild difficulty concentrating    [facility] Weights   06/17/21 1333 06/18/21 0042  Weight: 77.1 kg 72.6 kg     Intake/Output Summary (Last 24 hours) at 06/29/2021 0642 Last data filed at 06/28/2021 1816 Gross per 24 hour  Intake 674 ml  Output 500 ml  Net 174 ml   Net IO Since Admission: -1,136.17 mL [06/29/21 0642]  Pertinent Labs: CBC Latest Ref Rng & Units 06/25/2021 06/23/2021 06/22/2021  WBC 4.0 - 10.5 K/uL 8.2 8.4 7.7  Hemoglobin 12.0 - 15.0 g/dL 06/24/2021 55.7 32.2  Hematocrit 36.0 - 46.0 % 37.3 39.0 38.0  Platelets 150 - 400 K/uL 351 266 257    CMP Latest Ref Rng & Units 06/29/2021 06/28/2021 06/27/2021  Glucose 70 - 99 mg/dL 13/08/2020) 427(C) 623(J)  BUN 8 - 23 mg/dL 628(B) 15(V) 76(H)  Creatinine 0.44 - 1.00 mg/dL 60(V)  3.71(G) 6.26(R)  Sodium 135 - 145 mmol/L 138 135 136  Potassium 3.5 - 5.1 mmol/L 4.0 3.4(L) 3.0(L)  Chloride 98 - 111 mmol/L 110 108 106  CO2 22 - 32 mmol/L 20(L) 20(L) 20(L)  Calcium 8.9 - 10.3 mg/dL 9.4 9.2 9.2  Total Protein 6.5 - 8.1 g/dL - - -  Total Bilirubin 0.3 - 1.2 mg/dL - - -  Alkaline Phos 38 - 126 U/L - - -  AST 15 - 41 U/L - - -  ALT 0 - 44 U/L - - -    Imaging: No results found.  Assessment/Plan:   Active Problems:   Diabetes mellitus type 2 with neurological manifestations (HCC)   Pure hypercholesterolemia   Essential hypertension, benign   Weakness due to old stroke   Cerebral thrombosis with cerebral infarction   COVID-19   Patient Summary: Jade [name] is a 65 y.o. with PMH of HTN, HLD, T2DM, former smoker ,multiple CVA with recent CVA in August , and left carotid artery stenosis s/p left transcarotid artery revascularization (04/06/2021) ,who presented with weakness and admitted for subacute stroke.   Subacute CVA Weakness Medically, patient is stable and ready to continue PT as an outpatient, working on placement. Per social work she does not have medicaid and will not be able to go to a SNF. Discharge is complicated by inability to get in touch with husband, see social work's  notes from 11/2. Will continue to reach out and discuss other options with son. - Continue to control modifiable risk factors: HTN, HLD, T2DM - Continue aspirin and Brilinta.   Hypertension Patient doing well, blood pressures have been well controlled.  - Continue carvedilol carvedilol 6.25 mg and losartan 12.5 mg   Hyperlipidemia - Continue Atorvastatin 80 mg   Diarrhea incontinence, etiology unknown; improving  Imodium 4 g BID rather than PRN. Significantly improved - consider decreasing back to QD dosing tomorrow   Depressed Mood Patient has depressed moods. States she is missing her family and would like to she her son. Appears isolation from Covid also contributing.  Will reach out to family and encourage them to visit now that her isolation has ended. -Continue to monitor.    Type 2 Diabetes Mellitus  - Hemoglobin A1c 6.5 03/2021, glucose level stable.  Continue SSI   Hypokalemia; resolved  AKI (resolved)   CKD Stage 3a (stable)   Covid-19; resolved - off precautions   Diet: Carb-Modified IVF: None,None VTE: Enoxaparin Code: DNR PT/OT recs: SNF for Subacute PT, .   Prior to Admission Living Arrangement: Home Anticipated Discharge Location: SNF Barriers to Discharge: Placement Dispo: Anticipated discharge to Skilled nursing facility in 2 days pending placement.     Scarlett Presto, MD Internal Medicine Resident PGY-1 Pager [phone] Please contact the on call pager after 5 pm and on weekends at [phone].

## 2021-06-29 NOTE — TOC Progression Note (Addendum)
Transition of Care [facility]) - Progression Note    Patient Details  Name: Jade [name] MRN: [id number] Date of Birth: [birth year]  Transition of Care [facility]) CM/SW Contact  Lorri Frederick, LCSW Phone Number: 06/29/2021, 10:57 AM  Clinical Narrative:   CSW spoke with pt regarding contact with husband, plan to get DME ordered and delivered today.  Pt has a custom wheelchair ordered.  CSW spoke with [employer], pt received walker and 3n1 earlier this year, confirmed this with pt.    Paged MD regarding ordering DME  CSW spoke with son [name], confirmed plan to deliver DME today.  Son spoke with pt husband after police stopped by last night, still not sure he is going to allow pt in while quarantining.  He will talk to him again and CSW will work on order/delivery for the DME. He provided contact information for [employer], the company that ordered a custom wheelchair: [phone].  1000: CSW spoke with [employer], process was started in Sept but it can take 4-5 months.  They don't have insurance approval, chair has not even been ordered, no timeline for delivery, but they did provide a loaner wheelchair to pt.  CSW confirmed with pt she does have this wheelchair at home.     1100: CSW spoke with MD, requested they order hospital bed and hoyer lift  1115: Per Velna Hatchet at [facility], pt is active with medicaid: [id number] N  1500: CSW faxed referral to request evaluation for [facility] aide services to [employer]. 1530: Text from pt son, hospital bed has been delivered.      Expected Discharge Plan: IP Rehab Facility Barriers to Discharge: Continued Medical Work up  Expected Discharge Plan and Services Expected Discharge Plan: IP Rehab Facility In-house Referral: Clinical Social Work   Post Acute Care Choice: IP Rehab Living arrangements for the past 2 months: Single Family Home                                       Social Determinants of Health (SDOH) Interventions    Readmission  Risk Interventions No flowsheet data found.

## 2021-06-29 NOTE — Progress Notes (Signed)
Patient unable to reposition herself without help and family will not be able to do so without this equipment. Daleen Squibb, MSW, LCSW 11/3/20222:03 PM

## 2021-06-30 ENCOUNTER — Other Ambulatory Visit (HOSPITAL_COMMUNITY): Payer: Self-pay

## 2021-06-30 DIAGNOSIS — E785 Hyperlipidemia, unspecified: Secondary | ICD-10-CM

## 2021-06-30 DIAGNOSIS — N1831 Chronic kidney disease, stage 3a: Secondary | ICD-10-CM

## 2021-06-30 DIAGNOSIS — N179 Acute kidney failure, unspecified: Secondary | ICD-10-CM

## 2021-06-30 LAB — BASIC METABOLIC PANEL
Anion gap: 6 (ref 5–15)
BUN: 26 mg/dL — ABNORMAL HIGH (ref 8–23)
CO2: 22 mmol/L (ref 22–32)
Calcium: 9.4 mg/dL (ref 8.9–10.3)
Chloride: 108 mmol/L (ref 98–111)
Creatinine, Ser: 1.23 mg/dL — ABNORMAL HIGH (ref 0.44–1.00)
GFR, Estimated: 48 mL/min — ABNORMAL LOW (ref >60)
Glucose, Bld: 142 mg/dL — ABNORMAL HIGH (ref 70–99)
Potassium: 4.2 mmol/L (ref 3.5–5.1)
Sodium: 136 mmol/L (ref 135–145)

## 2021-06-30 LAB — GLUCOSE, CAPILLARY
Glucose-Capillary: 128 mg/dL — ABNORMAL HIGH (ref 70–99)
Glucose-Capillary: 119 mg/dL — ABNORMAL HIGH (ref 70–99)
Glucose-Capillary: 115 mg/dL — ABNORMAL HIGH (ref 70–99)
Glucose-Capillary: 136 mg/dL — ABNORMAL HIGH (ref 70–99)

## 2021-06-30 MED ORDER — TICAGRELOR 90 MG PO TABS
90.0000 mg | ORAL_TABLET | Freq: Two times a day (BID) | ORAL | 0 refills | Status: AC
  Filled 2021-06-30: qty 48, 24d supply, fill #0

## 2021-06-30 MED ORDER — LOPERAMIDE HCL 2 MG PO CAPS
4.0000 mg | ORAL_CAPSULE | Freq: Two times a day (BID) | ORAL | 0 refills | Status: DC | PRN
  Filled 2021-06-30: qty 30, 8d supply, fill #0

## 2021-06-30 MED ORDER — LOSARTAN POTASSIUM 25 MG PO TABS
12.5000 mg | ORAL_TABLET | Freq: Every day | ORAL | 3 refills | Status: DC
  Filled 2021-06-30: qty 15, 30d supply, fill #0

## 2021-06-30 MED ORDER — CLOPIDOGREL BISULFATE 75 MG PO TABS
75.0000 mg | ORAL_TABLET | Freq: Every morning | ORAL | 2 refills | Status: AC
  Filled 2021-06-30 (×2): qty 30, 30d supply, fill #0

## 2021-06-30 MED ORDER — CARVEDILOL 6.25 MG PO TABS
6.2500 mg | ORAL_TABLET | Freq: Two times a day (BID) | ORAL | 3 refills | Status: DC
  Filled 2021-06-30: qty 60, 30d supply, fill #0

## 2021-06-30 MED ORDER — ASPIRIN 325 MG PO TBEC: 325.0000 mg | DELAYED_RELEASE_TABLET | Freq: Every day | ORAL | 0 refills | Status: DC

## 2021-06-30 MED ORDER — ASPIRIN 81 MG PO TBEC
81.0000 mg | DELAYED_RELEASE_TABLET | Freq: Every morning | ORAL | 0 refills | Status: AC
  Filled 2021-06-30: qty 24, 24d supply, fill #0

## 2021-06-30 NOTE — Progress Notes (Signed)
PT Cancellation Note  Patient Details Name: Jade [name] MRN: [id number] DOB: [birth year]   Cancelled Treatment:    Reason Eval/Treat Not Completed: Patient declined, no reason specified.  I'm leaving today so I don't want to do therapy. 06/30/2021  Jacinto Halim., PT Acute Rehabilitation Services [phone]  (pager) [phone]  (office)   Eliseo Gum Keiry Kowal 06/30/2021, 3:49 PM

## 2021-06-30 NOTE — Progress Notes (Signed)
PTAR at bedside, pt given night meds, discharge instructions given, fmaily updated on pt arrival to home

## 2021-06-30 NOTE — TOC Transition Note (Signed)
Transition of Care [facility]) - CM/SW Discharge Note   Patient Details  Name: Jade [name] MRN: [id number] Date of Birth: 01-05-1956  [facility] Care [facility]) CM/SW Contact:  Lorri Frederick, LCSW Phone Number: 06/30/2021, 1:50 PM   Clinical Narrative:  Pt discharging home with [facility].  Hospital bed and hoyer lift provided by [employer].  Referral made to [facility] for [facility] aide evaluation by fax.  Pt informed and provided with the referral form to follow up.  CSW confirmed with son [name] that he will be available when pt arrives home by [facility].     Final next level of care: Home w Home Health Services Barriers to Discharge: Barriers Resolved   Patient Goals and CMS Choice Patient states their goals for this hospitalization and ongoing recovery are:: improve mobility issues so she can better navigate her house      Discharge Placement                       Discharge Plan and Services In-house Referral: Clinical Social Work   Post Acute Care Choice: IP Rehab          DME Arranged: Hospital bed, Other see comment (hoyer lift) DME Agency: [employer] Date DME Agency Contacted: 06/29/21 Time DME Agency Contacted: 0900 Representative spoke with at DME Agency: Velna Hatchet HH Arranged: PT HH Agency: [facility] Date [facility] Agency Contacted: 06/29/21 Time HH Agency Contacted: 1200 Representative spoke with at [facility] Agency: Amy  Social Determinants of Health (SDOH) Interventions     Readmission Risk Interventions Readmission Risk Prevention Plan 06/30/2021  Transportation Screening Complete  PCP or Specialist Appt within 3-5 Days Complete  HRI or Home Care Consult Complete  Social Work Consult for Recovery Care Planning/Counseling Complete  Palliative Care Screening Not Applicable  Some recent data might be hidden

## 2021-06-30 NOTE — Plan of Care (Signed)
Pt is A/O, VS stable. IV removed and intact.  Bed in low position, alarms are on, call bell in reach.AVS education provided. All questions answered Waiting on PTAR and POC at this time

## 2021-07-04 ENCOUNTER — Other Ambulatory Visit: Payer: Self-pay | Admitting: *Deleted

## 2021-07-04 NOTE — Patient Outreach (Signed)
[facility]) Care Management  07/04/2021  Jade [name] [facility] 05/02/1956 [id number]   RED ON EMMI ALERT - Stroke Day # 1 Date: 11/7 Red Alert Reason: Scheduled follow up appointment? No Filled new prescriptions? No   Outreach attempt #1, successful.  Identity verified.  This care manager introduced self and stated purpose of call.  [facility] care management services explained.  Member is familiar with program, was followed post stroke a few months ago.  Son Jade [name] is caregiver, member grants permission to speak freely with son.  He report member now has follow up with neurology on 1/23, first available.  Son concerned about pain medication not working, prescribed Tramadol but state member is having "nerve" pain and would like something for that.  Notified son that member has appointment scheduled with PCP tomorrow, they weren't aware.  State she only has transportation on Mondays.  Conference call placed to office, visit rescheduled for 11/14.  Discussed concern for pain control, they will need to see member in office prior to ordering new medication.    Member has restarted services with [facility], will have nursing, PT, and OT.  Taking medications as prescribed, denies questions.  Denies any urgent concerns, encouraged to contact this care manager with questions.    Plan: RN CM will follow up with member and son within the next 2 weeks.  Jade [name], [place], MSN [facility] Care Management  [facility] Manager [phone]

## 2021-07-12 ENCOUNTER — Other Ambulatory Visit (HOSPITAL_COMMUNITY): Payer: Self-pay

## 2021-07-12 ENCOUNTER — Telehealth (HOSPITAL_COMMUNITY): Payer: Self-pay

## 2021-07-12 MED ORDER — DANTROLENE SODIUM 25 MG PO CAPS
50.0000 mg | ORAL_CAPSULE | Freq: Every day | ORAL | 0 refills | Status: DC
Start: 2021-07-11 — End: 2021-09-16
  Filled 2021-07-12: qty 60, 30d supply, fill #0

## 2021-07-12 NOTE — Telephone Encounter (Signed)
Pharmacy Transitions of Care Follow-up Telephone Call  Date of discharge: 06/30/21  Discharge Diagnosis: stroke  How have you been since you were released from the hospital?  Patient has been well but is unable to get dantrolene filled. Patient will be able to get med from [facility]. No other questions about meds at this time.  Medication changes made at discharge:      START taking: Brilinta (ticagrelor)  carvedilol (COREG)  loperamide (IMODIUM)  This replaces a similar medication. See the full medication list for instructions. losartan (COZAAR)  CHANGE how you take: Aspirin Low Dose (aspirin)  aspirin  clopidogrel (PLAVIX)  STOP taking: amLODipine 10 MG tablet (NORVASC)  diclofenac Sodium 1 % Gel (VOLTAREN)  docusate sodium 100 MG capsule (COLACE)  glimepiride 1 MG tablet (AMARYL)  hydrochlorothiazide 12.5 MG tablet (HYDRODIURIL)  lisinopril 10 MG tablet (ZESTRIL)  loperamide 2 MG tablet (IMODIUM A-D)  Replaced by a similar medication. magnesium oxide 400 (240 Mg) MG tablet (MAG-OX)  Natural Vitamin D-3 125 MCG (5000 UT) Tabs (Cholecalciferol)  polyethylene glycol 17 g packet (MIRALAX / GLYCOLAX)  tiZANidine 2 MG tablet (ZANAFLEX)  Vitamin B Complex Tabs   Medication changes verified by the patient? Yes    Medication Accessibility:  Home Pharmacy:  [facility]  Was the patient provided with refills on discharged medications? Yes   Have all prescriptions been transferred from [facility] to home pharmacy?  Yes  Is the patient able to afford medications? Patient has insurance    Medication Review:  TICAGRELOR (BRILINTA) Ticagrelor 90 mg BID initiated on 06/30/21.  - Educated patient on expected duration of therapy of aspirin with ticagrelor.  - Discussed importance of taking medication around the same time every day, - Advised patient of medications to avoid (NSAIDs, aspirin maintenance doses>100 mg daily) - Educated that Tylenol (acetaminophen) will be the preferred  analgesic to prevent risk of bleeding  - Emphasized importance of monitoring for signs and symptoms of bleeding (abnormal bruising, prolonged bleeding, nose bleeds, bleeding from gums, discolored urine, black tarry stools)  - Educated patient to notify doctor if shortness of breath or abnormal heartbeat occur - Advised patient to alert all providers of antiplatelet therapy prior to starting a new medication or having a procedure    Follow-up Appointments:  PCP Hospital f/u appt confirmed? Had follow up with Dr. Jean Rosenthal on 07/10/21  Specialist Hospital f/u appt confirmed? Scheduled to see Dr. Pearlean Brownie on 09/18/21 @ 9:30am.   If their condition worsens, is the pt aware to call PCP or go to the Emergency Dept.? yes  Final Patient Assessment: Patient has follow up scheduled and refills at home pharmacy

## 2021-07-14 ENCOUNTER — Other Ambulatory Visit: Payer: Self-pay | Admitting: Physical Medicine & Rehabilitation

## 2021-07-17 ENCOUNTER — Other Ambulatory Visit: Payer: Self-pay | Admitting: *Deleted

## 2021-07-17 NOTE — Patient Outreach (Signed)
[facility]) Care Management  07/17/2021  Jade [name] [birth year] [id number]   RED ON EMMI ALERT - Stroke Day # 13 Date: 11/19 Red Alert Reason: Feeling worse overall and New problems walking/talking/seeing/speaking   Outreach attempt #1, successful to member and son.  They both report member has been doing better.  She is now active again with [facility] home health for RN and PT, slowly regaining strength.  Was seen by PCP on 11/14.  State she has had some trouble with her vision, but this was an issue before the stroke. She has not been able to keep her appointments with the eye doctor due to hospitalizations but will call to schedule follow up.  Vision is better when using eye drops.  Denies any urgent concerns, encouraged to contact this care manager with questions.     Plan: RN CM will close case at this time as no further needs identified.    Kemper Durie, [place], MSN [facility] Care Management  [facility] Manager [phone]

## 2021-07-18 ENCOUNTER — Ambulatory Visit: Payer: Self-pay | Admitting: *Deleted

## 2021-08-10 ENCOUNTER — Other Ambulatory Visit (HOSPITAL_COMMUNITY): Payer: Self-pay

## 2021-08-10 ENCOUNTER — Other Ambulatory Visit: Payer: Self-pay | Admitting: Internal Medicine

## 2021-08-10 MED ORDER — CLOPIDOGREL BISULFATE 75 MG PO TABS
75.0000 mg | ORAL_TABLET | ORAL | 1 refills | Status: DC
Start: 2021-07-12 — End: 2021-09-16
  Filled 2021-08-10: qty 30, 30d supply, fill #0

## 2021-08-10 NOTE — Telephone Encounter (Signed)
Not a Clinic patient.   

## 2021-08-11 ENCOUNTER — Other Ambulatory Visit (HOSPITAL_COMMUNITY): Payer: Self-pay

## 2021-08-11 MED ORDER — DANTROLENE SODIUM 50 MG PO CAPS
50.0000 mg | ORAL_CAPSULE | Freq: Every day | ORAL | 0 refills | Status: DC
  Filled 2021-08-11: qty 30, 30d supply, fill #0

## 2021-08-14 ENCOUNTER — Other Ambulatory Visit (HOSPITAL_COMMUNITY): Payer: Self-pay

## 2021-08-17 ENCOUNTER — Other Ambulatory Visit (HOSPITAL_COMMUNITY): Payer: Self-pay

## 2021-08-23 ENCOUNTER — Emergency Department (HOSPITAL_COMMUNITY)
Admission: EM | Admit: 2021-08-23 | Discharge: 2021-08-25 | Disposition: A | Discharge status: Home / Self Care | Payer: Medicare Other | Attending: Emergency Medicine | Admitting: Emergency Medicine

## 2021-08-23 ENCOUNTER — Other Ambulatory Visit: Payer: Self-pay

## 2021-08-23 ENCOUNTER — Encounter (HOSPITAL_COMMUNITY): Payer: Self-pay | Admitting: Emergency Medicine

## 2021-08-23 DIAGNOSIS — N1831 Chronic kidney disease, stage 3a: Secondary | ICD-10-CM | POA: Diagnosis not present

## 2021-08-23 DIAGNOSIS — Z7982 Long term (current) use of aspirin: Secondary | ICD-10-CM | POA: Diagnosis not present

## 2021-08-23 DIAGNOSIS — I129 Hypertensive chronic kidney disease with stage 1 through stage 4 chronic kidney disease, or unspecified chronic kidney disease: Secondary | ICD-10-CM | POA: Diagnosis not present

## 2021-08-23 DIAGNOSIS — E1149 Type 2 diabetes mellitus with other diabetic neurological complication: Secondary | ICD-10-CM | POA: Diagnosis not present

## 2021-08-23 DIAGNOSIS — E1122 Type 2 diabetes mellitus with diabetic chronic kidney disease: Secondary | ICD-10-CM | POA: Insufficient documentation

## 2021-08-23 DIAGNOSIS — R531 Weakness: Secondary | ICD-10-CM | POA: Insufficient documentation

## 2021-08-23 DIAGNOSIS — Z8616 Personal history of COVID-19: Secondary | ICD-10-CM | POA: Insufficient documentation

## 2021-08-23 DIAGNOSIS — G8929 Other chronic pain: Secondary | ICD-10-CM | POA: Insufficient documentation

## 2021-08-23 DIAGNOSIS — J449 Chronic obstructive pulmonary disease, unspecified: Secondary | ICD-10-CM | POA: Insufficient documentation

## 2021-08-23 DIAGNOSIS — R079 Chest pain, unspecified: Secondary | ICD-10-CM | POA: Insufficient documentation

## 2021-08-23 DIAGNOSIS — Z87891 Personal history of nicotine dependence: Secondary | ICD-10-CM | POA: Insufficient documentation

## 2021-08-23 DIAGNOSIS — Z7901 Long term (current) use of anticoagulants: Secondary | ICD-10-CM | POA: Insufficient documentation

## 2021-08-23 NOTE — ED Triage Notes (Addendum)
Patient arrived with EMS from home reports generalized weakness/fatigue and body aches for 3 weeks . Patient added low back pain and bilateral ribcage pain onset yesterday denies injury.

## 2021-08-23 NOTE — ED Provider Notes (Signed)
Emergency Medicine Provider Triage Evaluation Note  Jade [name] , a 65 y.o. female  was evaluated in triage.  Pt complains of back pain.  Reports decreased ability to perform ADLs due to pain.  States that she needs a wheel chair, but doesn't have one.  Hx of scoliosis, AS, and osteoporosis Denies fever or chills. States that she has urinated and defecated on herself because she isn't mobile enough to get to the bathroom. Review of Systems  Positive: Back pain Negative: Fever, chills  Physical Exam  There were no vitals taken for this visit. Gen:   Awake, no distress   Resp:  Normal effort  MSK:   Moves extremities without difficulty  Other:    Medical Decision Making  Medically screening exam initiated at 11:24 PM.  Appropriate orders placed.  Jade [name] was informed that the remainder of the evaluation will be completed by another provider, this initial triage assessment does not replace that evaluation, and the importance of remaining in the ED until their evaluation is complete.  Back pain Here for pain control   Roxy Horseman, PA-C 08/23/21 2326    Maia Plan, MD 08/24/21 [phone]

## 2021-08-24 ENCOUNTER — Emergency Department (HOSPITAL_COMMUNITY): Payer: Medicare Other

## 2021-08-24 LAB — BASIC METABOLIC PANEL
Anion gap: 10 (ref 5–15)
BUN: 23 mg/dL (ref 8–23)
CO2: 24 mmol/L (ref 22–32)
Calcium: 10 mg/dL (ref 8.9–10.3)
Chloride: 106 mmol/L (ref 98–111)
Creatinine, Ser: 0.93 mg/dL (ref 0.44–1.00)
GFR, Estimated: >60 mL/min (ref >60)
Glucose, Bld: 182 mg/dL — ABNORMAL HIGH (ref 70–99)
Potassium: 4.2 mmol/L (ref 3.5–5.1)
Sodium: 140 mmol/L (ref 135–145)

## 2021-08-24 LAB — CBC
HCT: 45.7 % (ref 36.0–46.0)
Hemoglobin: 15.4 g/dL — ABNORMAL HIGH (ref 12.0–15.0)
MCH: 32.2 pg (ref 26.0–34.0)
MCHC: 33.7 g/dL (ref 30.0–36.0)
MCV: 95.4 fL (ref 80.0–100.0)
Platelets: 340 10*3/uL (ref 150–400)
RBC: 4.79 MIL/uL (ref 3.87–5.11)
RDW: 13.3 % (ref 11.5–15.5)
WBC: 9.1 10*3/uL (ref 4.0–10.5)
nRBC: 0 % (ref 0.0–0.2)

## 2021-08-24 MED ORDER — OXYCODONE HCL 5 MG PO TABS
10.0000 mg | ORAL_TABLET | Freq: Once | ORAL | Status: AC
  Administered 2021-08-24: 12:00:00 10 mg via ORAL
  Filled 2021-08-24: qty 2

## 2021-08-24 MED ORDER — OXYCODONE HCL 5 MG PO TABS: 5.0000 mg | ORAL_TABLET | Freq: Four times a day (QID) | ORAL | 0 refills | Status: DC | PRN

## 2021-08-24 NOTE — Progress Notes (Signed)
Pt from home with family.  Pt states she is having hard time ambulating and her caregivers are also having a difficult time caring for her.  Pt has the following DME at home: wheelchair [employer] [phone], rolling walker, hoyer lift, and hospital bed ([employer] [phone]).  Pt states she sent her wheelchair out for repair and has not heard back.  RNCM contacted company to find that pt has loaner wheelchair (delivered 12/14) and they are still repairing/updating hers.  They estimate delivery of her wheelchair within 2 weeks.   Pt currently active with [facility] for Home Health services as confirmed by [facility] with Mayra Reel of [facility].  Pt will resume HH services of PT.

## 2021-08-24 NOTE — Discharge Instructions (Addendum)
You should contact your social worker regarding your wheelchair needs at home.  I placed an order for additional home health in our system for daytime assistance. You may or may not be eligible for this, depending on your insurance policies in your current needs and requirements.  I provided a short-term prescription for oxycodone for severe pain to your pharmacy.  Remember this is a short-term solution to your chronic pain, and that further chronic pain issues should be addressed by primary care clinic, not by the emergency department.  Your blood test and xray of the chest today today did not show any signs of emergencies, including anemia, dehydration, or signs of broken ribs on your x-ray.

## 2021-08-24 NOTE — ED Notes (Addendum)
Patient is resting comfortably at this time, sitting in recliner at bedside and sleeping.waiting on PTAR for transport

## 2021-08-24 NOTE — ED Provider Notes (Signed)
[facility] EMERGENCY DEPARTMENT Provider Note   CSN: [id number] Arrival date & time: 08/23/21  2321     History Chief Complaint  Patient presents with   Gen . Weakness    Jade [name] is a 65 y.o. female with a history of diabetes, fibromyalgia, chronic back pain, scoliosis of the spine, arthritis, presenting to Emergency Department with generalized pain.  The patient reports that she is having increasing difficulty with mobility in her house, and that her son is her primary caretaker and having difficulty taking care of her.  She reports that her wheelchair company took away her wheelchair several days ago, promised her a new 1 but did not send a new one.  She typically uses a wheelchair to get around.  She has been essentially bedbound in her recliner since then.  Her son picks her up to transfer her, but he is having difficulty doing that.  She does get home PT and OT 3 times a week, but says that outside of that physical therapy, she is not getting any therapy.  She reports that she is taking several medications for pain, but reports that oxycodone was the only thing that helped her in the past, she is currently not receiving oxycodone prescriptions currently.  She currently complains of bilateral rib soreness when her son wrapped his arms around her to pick her up.  She reports pain diffusely in her back.    HPI     Past Medical History:  Diagnosis Date   Arthritis    "back, knees, some in my left hip" (03/21/2018)   Chronic kidney disease    "was told I had 25% kidney function in early 2012" (03/21/2018)   COPD (chronic obstructive pulmonary disease) (HCC)    CVA (cerebral vascular accident) ([place]) 03/21/2018   "numb all over; head to toe" (03/21/2018)   Fibromyalgia    History of gout    Hyperlipidemia    Hypertension    Migraine    "used to get them monthly during menopause" (03/21/2018)   Neuromuscular disorder ([place])    OSA (obstructive sleep  apnea)    "don't tolerate the machine" (03/21/2018)   Pneumonia ~ 2007   Type 2 diabetes, diet controlled ([place])     Patient Active Problem List   Diagnosis Date Noted   COVID-19 06/25/2021   Cerebral thrombosis with cerebral infarction 06/19/2021   Weakness due to old stroke 06/17/2021   Labile blood glucose    Essential hypertension    Spastic hemiparesis (HCC)    Stage 3a chronic kidney disease (HCC)    Controlled type 2 diabetes mellitus with hyperglycemia, without long-term current use of insulin (HCC)    CVA (cerebral vascular accident) ([place]) 04/13/2021   TIA (transient ischemic attack) 04/02/2021   Carotid artery stenosis with cerebral infarction ([place]) 04/02/2021   COPD (chronic obstructive pulmonary disease) ([place]) 04/02/2021   Stroke-like symptoms 03/05/2021   Acute CVA (cerebrovascular accident) ([place]) 03/21/2018   Obesity, Class III, BMI 40-49.9 (morbid obesity) ([place]) 03/21/2018   IBS (irritable bowel syndrome) 04/12/2015   Hair loss 10/20/2014   Primary osteoarthritis of left knee 09/29/2014   Visit for screening mammogram 08/30/2014   Routine general medical examination at a health care facility 08/30/2014   Tinea corporis 12/21/2013   Essential hypertension, benign 12/21/2013   Low back pain 08/24/2013   Patient noncompliant with statin medication 02/23/2013   H/O abnormal Pap smear 10/24/2012   Osteopenia 09/26/2012   Diabetic neuropathy, painful ([place])  05/26/2012   Diabetes mellitus type 2 with neurological manifestations ([place]) 03/20/2012   Pure hypercholesterolemia 03/20/2012   Chronic venous insufficiency 03/20/2012    Past Surgical History:  Procedure Laterality Date   HERNIA REPAIR     LAPAROSCOPIC CHOLECYSTECTOMY  06/2007   "no UHR w/this" (03/21/2018)   TONSILLECTOMY     TRANSCAROTID ARTERY REVASCULARIZATION  Left 04/06/2021   Procedure: LEFT TRANSCAROTID ARTERY REVASCULARIZATION;  Surgeon: Cherre Robins, MD;  Location: [facility] OR;  Service: Vascular;   Laterality: Left;   ULTRASOUND GUIDANCE FOR VASCULAR ACCESS Right 04/06/2021   Procedure: ULTRASOUND GUIDANCE FOR VASCULAR ACCESS;  Surgeon: Cherre Robins, MD;  Location: [facility] OR;  Service: Vascular;  Laterality: Right;   UMBILICAL HERNIA REPAIR  2009     OB History     Gravida  3   Para  1   Term  1   Preterm      AB  2   Living  1      SAB  2   IAB      Ectopic      Multiple      Live Births  1           Family History  Problem Relation Age of Onset   Heart disease Father    Leukemia Father    Alcohol abuse Son    Other Mother    Cancer Neg Hx    Diabetes Neg Hx    Hearing loss Neg Hx    Hyperlipidemia Neg Hx    Hypertension Neg Hx    Kidney disease Neg Hx    Stroke Neg Hx     Social History   Tobacco Use   Smoking status: Former    Packs/day: 1.25    Years: 16.00    Pack years: 20.00    Types: Cigarettes    Quit date: 08/28/1987    Years since quitting: 34.0   Smokeless tobacco: Never  Vaping Use   Vaping Use: Never used  Substance Use Topics   Alcohol use: Not Currently   Drug use: Not Currently    Types: Marijuana    Home Medications Prior to Admission medications   Medication Sig Start Date End Date Taking? Authorizing Provider  oxyCODONE (ROXICODONE) 5 MG immediate release tablet Take 1 tablet (5 mg total) by mouth every 6 (six) hours as needed for up to 15 doses for severe pain. 08/24/21  Yes Tujuana Kilmartin, Carola Rhine, MD  acetaminophen (TYLENOL) 325 MG tablet Take 2 tablets (650 mg total) by mouth every 6 (six) hours as needed for mild pain or headache (or temp > 37.5 C (99.5 F)). 04/13/21   Pokhrel, Corrie Mckusick, MD  aspirin 325 MG EC tablet Take 1 tablet (325 mg total) by mouth daily. 07/23/21 09/21/21  Demaio, Roxine Caddy, MD  atorvastatin (LIPITOR) 80 MG tablet Take 1 tablet (80 mg total) by mouth daily. 06/14/21 07/14/21  Lucrezia Starch, MD  carvedilol (COREG) 6.25 MG tablet Take 1 tablet (6.25 mg total) by mouth 2 (two) times daily with a meal.  06/30/21 09/28/21  Demaio, Roxine Caddy, MD  clopidogrel (PLAVIX) 75 MG tablet Start taking after Brilinta is complete - Take 1 tablet (75 mg total) by mouth every morning. 07/23/21 10/21/21  Scarlett Presto, MD  clopidogrel (PLAVIX) 75 MG tablet Take 1 tablet (75 mg total) by mouth every morning. Start after [employer] Completed. 07/12/21   Demaio, Alexa, MD  dantrolene (DANTRIUM) 25 MG capsule Take 2 capsules (50 mg total) by  mouth at bedtime. 07/11/21   Barnie Mort, PA-C  dantrolene (DANTRIUM) 50 MG capsule Take 1 capsule (50 mg total) by mouth at bedtime. 05/11/21   Angiulli, Mcarthur Rossetti, PA-C  dantrolene (DANTRIUM) 50 MG capsule Take 1 capsule (50 mg total) by mouth at bedtime. 08/11/21     loperamide (IMODIUM) 2 MG capsule Take 2 capsules (4 mg total) by mouth 2 (two) times daily as needed for diarrhea or loose stools. 06/30/21   Demaio, Alexa, MD  losartan (COZAAR) 25 MG tablet Take 1/2 tablets (12.5 mg total) by mouth daily. 06/30/21 09/28/21  Demaio, Tyrone Apple, MD  magnesium oxide (MAG-OX) 400 MG tablet Take 2 tablets (800 mg total) by mouth at bedtime. Patient taking differently: Take 400 mg by mouth at bedtime. 05/11/21   Angiulli, Mcarthur Rossetti, PA-C  pantoprazole (PROTONIX) 40 MG tablet Take 1 tablet (40 mg total) by mouth daily. Patient not taking: No sig reported 05/11/21   Angiulli, Mcarthur Rossetti, PA-C  tamsulosin (FLOMAX) 0.4 MG CAPS capsule Take 1 capsule (0.4 mg total) by mouth daily after supper. 05/11/21   Angiulli, Mcarthur Rossetti, PA-C    Allergies    Baclofen and Nickel  Review of Systems   Review of Systems  Constitutional:  Negative for chills and fever.  Respiratory:  Negative for cough and shortness of breath.   Cardiovascular:  Negative for chest pain and palpitations.  Gastrointestinal:  Negative for abdominal pain and vomiting.  Genitourinary:  Negative for hematuria.  Musculoskeletal:  Positive for arthralgias and myalgias.  Skin:  Negative for color change and rash.  Neurological:  Negative for  syncope and headaches.  All other systems reviewed and are negative.  Physical Exam Updated Vital Signs BP (!) 173/85    Pulse 79    Temp 98.2 F (36.8 C)    Resp 14    SpO2 100%   Physical Exam Constitutional:      General: She is not in acute distress. HENT:     Head: Normocephalic and atraumatic.  Eyes:     Conjunctiva/sclera: Conjunctivae normal.     Pupils: Pupils are equal, round, and reactive to light.  Cardiovascular:     Rate and Rhythm: Normal rate and regular rhythm.  Pulmonary:     Effort: Pulmonary effort is normal. No respiratory distress.  Skin:    General: Skin is warm and dry.  Neurological:     General: No focal deficit present.     Mental Status: She is alert and oriented to person, place, and time. Mental status is at baseline.    ED Results / Procedures / Treatments   Labs (all labs ordered are listed, but only abnormal results are displayed) Labs Reviewed  BASIC METABOLIC PANEL - Abnormal; Notable for the following components:      Result Value   Glucose, Bld 182 (*)    All other components within normal limits  CBC - Abnormal; Notable for the following components:   Hemoglobin 15.4 (*)    All other components within normal limits    EKG None  Radiology DG Chest Portable 1 View  Result Date: 08/24/2021 CLINICAL DATA:  Diffuse chest wall pain. EXAM: PORTABLE CHEST 1 VIEW COMPARISON:  Chest x-ray dated April 02, 2021. FINDINGS: The heart size and mediastinal contours are within normal limits. Both lungs are clear. The visualized skeletal structures are unremarkable. IMPRESSION: No active disease. Electronically Signed   By: Obie Dredge M.D.   On: 08/24/2021 12:15    Procedures Procedures  Medications Ordered in ED Medications  oxyCODONE (Oxy IR/ROXICODONE) immediate release tablet 10 mg (10 mg Oral Given 08/24/21 1155)    ED Course  I have reviewed the triage vital signs and the nursing notes.  Pertinent labs & imaging results that  were available during my care of the patient were reviewed by me and considered in my medical decision making (see chart for details).  Patient is here with worsening back pain and spasms, complaining of difficulty with her ADLs due to generalized pain.  She is in need of a wheelchair and other home resources.  She also is in need of pain medicine.  I reported that we can provide a short-term pain medication prescription, and I will consult TOC/SW to see what additional assistance she may be eligible for at home.  Clinical Course as of 08/24/21 1740  Thu Aug 24, 2021  1445 I reviewed the patient's blood test with her, and her husband is not present at the bedside.  Her work-up is unremarkable.  She feels significantly better after the oxycodone.  I told her I can provide her a short-term prescription, although this is not a long-term solution.  She verbalizes understanding.  She feels this will help her at least sleep.  Her case manager did call and discuss her missing wheelchair with her wheelchair company, who had reported to the case manager that they did provide a replacement wheelchair.  The patient denies that she was given a replacement wheelchair.  She does have a Education officer, museum.  I advised that she contact her social worker to see what additional assistance can be provided at home, [MT]  1445 She agreed with this plan and is stable for discharge [MT]    Clinical Course User Index [MT] Langston Masker, Carola Rhine, MD   Final Clinical Impression(s) / ED Diagnoses Final diagnoses:  Other chronic pain  Chest pain, unspecified type    Rx / DC Orders ED Discharge Orders          Ordered    oxyCODONE (ROXICODONE) 5 MG immediate release tablet  Every 6 hours PRN        08/24/21 1446             Wyvonnia Dusky, MD 08/24/21 1740

## 2021-08-24 NOTE — ED Notes (Signed)
Patient transported to X-ray 

## 2021-08-25 NOTE — ED Notes (Signed)
Patient is resting comfortably. Husband at bedside.  Still awaiting [facility] for transport

## 2021-09-13 ENCOUNTER — Other Ambulatory Visit: Payer: Self-pay | Admitting: Internal Medicine

## 2021-09-16 ENCOUNTER — Inpatient Hospital Stay (HOSPITAL_COMMUNITY): Payer: Medicare Other

## 2021-09-16 ENCOUNTER — Emergency Department (HOSPITAL_COMMUNITY): Payer: Medicare Other

## 2021-09-16 ENCOUNTER — Encounter (HOSPITAL_COMMUNITY): Payer: Self-pay

## 2021-09-16 ENCOUNTER — Inpatient Hospital Stay (HOSPITAL_COMMUNITY)
Admission: EM | Admit: 2021-09-16 | Discharge: 2021-09-22 | DRG: 064 | Disposition: A | Discharge status: Skilled Nursing Facility | Payer: Medicare Other | Attending: Internal Medicine | Admitting: Internal Medicine

## 2021-09-16 ENCOUNTER — Other Ambulatory Visit: Payer: Self-pay

## 2021-09-16 DIAGNOSIS — K264 Chronic or unspecified duodenal ulcer with hemorrhage: Secondary | ICD-10-CM | POA: Diagnosis present

## 2021-09-16 DIAGNOSIS — R262 Difficulty in walking, not elsewhere classified: Secondary | ICD-10-CM | POA: Diagnosis present

## 2021-09-16 DIAGNOSIS — K269 Duodenal ulcer, unspecified as acute or chronic, without hemorrhage or perforation: Secondary | ICD-10-CM

## 2021-09-16 DIAGNOSIS — N1831 Chronic kidney disease, stage 3a: Secondary | ICD-10-CM | POA: Diagnosis present

## 2021-09-16 DIAGNOSIS — R29708 NIHSS score 8: Secondary | ICD-10-CM | POA: Diagnosis not present

## 2021-09-16 DIAGNOSIS — R29715 NIHSS score 15: Secondary | ICD-10-CM | POA: Diagnosis not present

## 2021-09-16 DIAGNOSIS — I63511 Cerebral infarction due to unspecified occlusion or stenosis of right middle cerebral artery: Principal | ICD-10-CM | POA: Diagnosis present

## 2021-09-16 DIAGNOSIS — J449 Chronic obstructive pulmonary disease, unspecified: Secondary | ICD-10-CM | POA: Diagnosis present

## 2021-09-16 DIAGNOSIS — I129 Hypertensive chronic kidney disease with stage 1 through stage 4 chronic kidney disease, or unspecified chronic kidney disease: Secondary | ICD-10-CM | POA: Diagnosis present

## 2021-09-16 DIAGNOSIS — Z8673 Personal history of transient ischemic attack (TIA), and cerebral infarction without residual deficits: Secondary | ICD-10-CM | POA: Diagnosis not present

## 2021-09-16 DIAGNOSIS — Z888 Allergy status to other drugs, medicaments and biological substances status: Secondary | ICD-10-CM

## 2021-09-16 DIAGNOSIS — Z7902 Long term (current) use of antithrombotics/antiplatelets: Secondary | ICD-10-CM

## 2021-09-16 DIAGNOSIS — Z20822 Contact with and (suspected) exposure to covid-19: Secondary | ICD-10-CM | POA: Diagnosis present

## 2021-09-16 DIAGNOSIS — R29709 NIHSS score 9: Secondary | ICD-10-CM | POA: Diagnosis not present

## 2021-09-16 DIAGNOSIS — K2980 Duodenitis without bleeding: Secondary | ICD-10-CM | POA: Diagnosis present

## 2021-09-16 DIAGNOSIS — D649 Anemia, unspecified: Secondary | ICD-10-CM | POA: Diagnosis not present

## 2021-09-16 DIAGNOSIS — R509 Fever, unspecified: Secondary | ICD-10-CM | POA: Diagnosis present

## 2021-09-16 DIAGNOSIS — K3189 Other diseases of stomach and duodenum: Secondary | ICD-10-CM | POA: Diagnosis present

## 2021-09-16 DIAGNOSIS — I672 Cerebral atherosclerosis: Secondary | ICD-10-CM | POA: Diagnosis present

## 2021-09-16 DIAGNOSIS — H543 Unqualified visual loss, both eyes: Secondary | ICD-10-CM | POA: Diagnosis present

## 2021-09-16 DIAGNOSIS — I63539 Cerebral infarction due to unspecified occlusion or stenosis of unspecified posterior cerebral artery: Secondary | ICD-10-CM | POA: Diagnosis not present

## 2021-09-16 DIAGNOSIS — G8194 Hemiplegia, unspecified affecting left nondominant side: Secondary | ICD-10-CM | POA: Diagnosis not present

## 2021-09-16 DIAGNOSIS — R000 Tachycardia, unspecified: Secondary | ICD-10-CM

## 2021-09-16 DIAGNOSIS — I69351 Hemiplegia and hemiparesis following cerebral infarction affecting right dominant side: Secondary | ICD-10-CM

## 2021-09-16 DIAGNOSIS — Z8249 Family history of ischemic heart disease and other diseases of the circulatory system: Secondary | ICD-10-CM

## 2021-09-16 DIAGNOSIS — I639 Cerebral infarction, unspecified: Secondary | ICD-10-CM

## 2021-09-16 DIAGNOSIS — R29707 NIHSS score 7: Secondary | ICD-10-CM | POA: Diagnosis not present

## 2021-09-16 DIAGNOSIS — M797 Fibromyalgia: Secondary | ICD-10-CM | POA: Diagnosis present

## 2021-09-16 DIAGNOSIS — E1165 Type 2 diabetes mellitus with hyperglycemia: Secondary | ICD-10-CM | POA: Diagnosis present

## 2021-09-16 DIAGNOSIS — M479 Spondylosis, unspecified: Secondary | ICD-10-CM | POA: Diagnosis present

## 2021-09-16 DIAGNOSIS — E7800 Pure hypercholesterolemia, unspecified: Secondary | ICD-10-CM | POA: Diagnosis present

## 2021-09-16 DIAGNOSIS — Z993 Dependence on wheelchair: Secondary | ICD-10-CM

## 2021-09-16 DIAGNOSIS — G4733 Obstructive sleep apnea (adult) (pediatric): Secondary | ICD-10-CM | POA: Diagnosis present

## 2021-09-16 DIAGNOSIS — M170 Bilateral primary osteoarthritis of knee: Secondary | ICD-10-CM | POA: Diagnosis present

## 2021-09-16 DIAGNOSIS — Z66 Do not resuscitate: Secondary | ICD-10-CM | POA: Diagnosis not present

## 2021-09-16 DIAGNOSIS — R29705 NIHSS score 5: Secondary | ICD-10-CM | POA: Diagnosis not present

## 2021-09-16 DIAGNOSIS — Z79899 Other long term (current) drug therapy: Secondary | ICD-10-CM

## 2021-09-16 DIAGNOSIS — I10 Essential (primary) hypertension: Secondary | ICD-10-CM | POA: Diagnosis present

## 2021-09-16 DIAGNOSIS — D62 Acute posthemorrhagic anemia: Secondary | ICD-10-CM | POA: Diagnosis not present

## 2021-09-16 DIAGNOSIS — E1149 Type 2 diabetes mellitus with other diabetic neurological complication: Secondary | ICD-10-CM | POA: Diagnosis present

## 2021-09-16 DIAGNOSIS — I6389 Other cerebral infarction: Secondary | ICD-10-CM | POA: Diagnosis not present

## 2021-09-16 DIAGNOSIS — R195 Other fecal abnormalities: Secondary | ICD-10-CM | POA: Diagnosis present

## 2021-09-16 DIAGNOSIS — G43909 Migraine, unspecified, not intractable, without status migrainosus: Secondary | ICD-10-CM | POA: Diagnosis present

## 2021-09-16 DIAGNOSIS — E1122 Type 2 diabetes mellitus with diabetic chronic kidney disease: Secondary | ICD-10-CM | POA: Diagnosis present

## 2021-09-16 DIAGNOSIS — Z9109 Other allergy status, other than to drugs and biological substances: Secondary | ICD-10-CM

## 2021-09-16 DIAGNOSIS — M1612 Unilateral primary osteoarthritis, left hip: Secondary | ICD-10-CM | POA: Diagnosis present

## 2021-09-16 DIAGNOSIS — D500 Iron deficiency anemia secondary to blood loss (chronic): Secondary | ICD-10-CM | POA: Diagnosis not present

## 2021-09-16 DIAGNOSIS — H538 Other visual disturbances: Secondary | ICD-10-CM | POA: Diagnosis present

## 2021-09-16 DIAGNOSIS — Z7982 Long term (current) use of aspirin: Secondary | ICD-10-CM

## 2021-09-16 DIAGNOSIS — M109 Gout, unspecified: Secondary | ICD-10-CM | POA: Diagnosis present

## 2021-09-16 DIAGNOSIS — Z28310 Unvaccinated for covid-19: Secondary | ICD-10-CM

## 2021-09-16 DIAGNOSIS — K922 Gastrointestinal hemorrhage, unspecified: Secondary | ICD-10-CM | POA: Diagnosis not present

## 2021-09-16 DIAGNOSIS — Z9049 Acquired absence of other specified parts of digestive tract: Secondary | ICD-10-CM

## 2021-09-16 DIAGNOSIS — R29706 NIHSS score 6: Secondary | ICD-10-CM | POA: Diagnosis not present

## 2021-09-16 DIAGNOSIS — K921 Melena: Secondary | ICD-10-CM | POA: Diagnosis not present

## 2021-09-16 DIAGNOSIS — Z8701 Personal history of pneumonia (recurrent): Secondary | ICD-10-CM

## 2021-09-16 DIAGNOSIS — Z87891 Personal history of nicotine dependence: Secondary | ICD-10-CM

## 2021-09-16 LAB — COMPREHENSIVE METABOLIC PANEL
ALT: 13 U/L (ref 0–44)
AST: 17 U/L (ref 15–41)
Albumin: 3.2 g/dL — ABNORMAL LOW (ref 3.5–5.0)
Alkaline Phosphatase: 57 U/L (ref 38–126)
Anion gap: 8 (ref 5–15)
BUN: 21 mg/dL (ref 8–23)
CO2: 23 mmol/L (ref 22–32)
Calcium: 8.7 mg/dL — ABNORMAL LOW (ref 8.9–10.3)
Chloride: 107 mmol/L (ref 98–111)
Creatinine, Ser: 0.8 mg/dL (ref 0.44–1.00)
GFR, Estimated: >60 mL/min (ref >60)
Glucose, Bld: 155 mg/dL — ABNORMAL HIGH (ref 70–99)
Potassium: 3.5 mmol/L (ref 3.5–5.1)
Sodium: 138 mmol/L (ref 135–145)
Total Bilirubin: 0.3 mg/dL (ref 0.3–1.2)
Total Protein: 5.5 g/dL — ABNORMAL LOW (ref 6.5–8.1)

## 2021-09-16 LAB — DIFFERENTIAL
Abs Immature Granulocytes: 0.02 10*3/uL (ref 0.00–0.07)
Basophils Absolute: 0 10*3/uL (ref 0.0–0.1)
Basophils Relative: 1 %
Eosinophils Absolute: 0.1 10*3/uL (ref 0.0–0.5)
Eosinophils Relative: 1 %
Immature Granulocytes: 0 %
Lymphocytes Relative: 19 %
Lymphs Abs: 1.3 10*3/uL (ref 0.7–4.0)
Monocytes Absolute: 0.3 10*3/uL (ref 0.1–1.0)
Monocytes Relative: 5 %
Neutro Abs: 4.9 10*3/uL (ref 1.7–7.7)
Neutrophils Relative %: 74 %

## 2021-09-16 LAB — PROTIME-INR
INR: 1 (ref 0.8–1.2)
Prothrombin Time: 13.1 seconds (ref 11.4–15.2)

## 2021-09-16 LAB — ETHANOL: Alcohol, Ethyl (B): <10 mg/dL (ref <10)

## 2021-09-16 LAB — CBC
HCT: 24.6 % — ABNORMAL LOW (ref 36.0–46.0)
Hemoglobin: 7.7 g/dL — ABNORMAL LOW (ref 12.0–15.0)
MCH: 30.7 pg (ref 26.0–34.0)
MCHC: 31.3 g/dL (ref 30.0–36.0)
MCV: 98 fL (ref 80.0–100.0)
Platelets: 372 10*3/uL (ref 150–400)
RBC: 2.51 MIL/uL — ABNORMAL LOW (ref 3.87–5.11)
RDW: 14.2 % (ref 11.5–15.5)
WBC: 6.6 10*3/uL (ref 4.0–10.5)
nRBC: 0 % (ref 0.0–0.2)

## 2021-09-16 LAB — APTT: aPTT: 22 seconds — ABNORMAL LOW (ref 24–36)

## 2021-09-16 LAB — HEMOGLOBIN AND HEMATOCRIT, BLOOD
HCT: 18.4 % — ABNORMAL LOW (ref 36.0–46.0)
Hemoglobin: 5.9 g/dL — CL (ref 12.0–15.0)

## 2021-09-16 LAB — RESP PANEL BY RT-PCR (FLU A&B, COVID) ARPGX2
Influenza A by PCR: NEGATIVE
Influenza B by PCR: NEGATIVE
SARS Coronavirus 2 by RT PCR: NEGATIVE

## 2021-09-16 LAB — ABO/RH: ABO/RH(D): B NEG

## 2021-09-16 MED ORDER — IOHEXOL 300 MG/ML  SOLN
100.0000 mL | Freq: Once | INTRAMUSCULAR | Status: AC | PRN
  Administered 2021-09-16: 100 mL via INTRAVENOUS

## 2021-09-16 MED ORDER — ATORVASTATIN CALCIUM 80 MG PO TABS
80.0000 mg | ORAL_TABLET | Freq: Every day | ORAL | Status: DC
  Administered 2021-09-17 – 2021-09-22 (×6): 80 mg via ORAL
  Filled 2021-09-16 (×5): qty 1
  Filled 2021-09-16: qty 2

## 2021-09-16 MED ORDER — STROKE: EARLY STAGES OF RECOVERY BOOK
Freq: Once | Status: AC
  Filled 2021-09-16: qty 1

## 2021-09-16 MED ORDER — SODIUM CHLORIDE 0.9% IV SOLUTION: Freq: Once | INTRAVENOUS | Status: DC

## 2021-09-16 MED ORDER — PANTOPRAZOLE SODIUM 40 MG IV SOLR
40.0000 mg | Freq: Two times a day (BID) | INTRAVENOUS | Status: DC
  Administered 2021-09-17: 40 mg via INTRAVENOUS
  Filled 2021-09-16 (×2): qty 40

## 2021-09-16 MED ORDER — LORAZEPAM 2 MG/ML IJ SOLN
0.5000 mg | INTRAMUSCULAR | Status: DC | PRN
  Administered 2021-09-16: 0.5 mg via INTRAVENOUS
  Filled 2021-09-16: qty 1

## 2021-09-16 MED ORDER — ENOXAPARIN SODIUM 40 MG/0.4ML IJ SOSY
40.0000 mg | PREFILLED_SYRINGE | INTRAMUSCULAR | Status: DC
  Filled 2021-09-16: qty 0.4

## 2021-09-16 MED ORDER — ASPIRIN 300 MG RE SUPP: 300.0000 mg | Freq: Every day | RECTAL | Status: DC

## 2021-09-16 MED ORDER — ACETAMINOPHEN 325 MG PO TABS
650.0000 mg | ORAL_TABLET | ORAL | Status: DC | PRN
  Administered 2021-09-17 – 2021-09-20 (×2): 650 mg via ORAL
  Filled 2021-09-16 (×2): qty 2

## 2021-09-16 MED ORDER — ASPIRIN 325 MG PO TABS: 325.0000 mg | ORAL_TABLET | Freq: Every day | ORAL | Status: DC

## 2021-09-16 MED ORDER — DANTROLENE SODIUM 25 MG PO CAPS
50.0000 mg | ORAL_CAPSULE | Freq: Every day | ORAL | Status: DC
  Administered 2021-09-16: 50 mg via ORAL
  Filled 2021-09-16: qty 2

## 2021-09-16 MED ORDER — ASPIRIN 325 MG PO TABS
325.0000 mg | ORAL_TABLET | Freq: Every day | ORAL | Status: DC
  Filled 2021-09-16: qty 1

## 2021-09-16 MED ORDER — ASPIRIN EC 81 MG PO TBEC: 81.0000 mg | DELAYED_RELEASE_TABLET | Freq: Every day | ORAL | Status: DC

## 2021-09-16 MED ORDER — ACETAMINOPHEN 650 MG RE SUPP: 650.0000 mg | RECTAL | Status: DC | PRN

## 2021-09-16 MED ORDER — TIZANIDINE HCL 4 MG PO TABS
2.0000 mg | ORAL_TABLET | Freq: Every day | ORAL | Status: DC
  Administered 2021-09-16 – 2021-09-21 (×6): 2 mg via ORAL
  Filled 2021-09-16 (×6): qty 1

## 2021-09-16 MED ORDER — ACETAMINOPHEN 160 MG/5ML PO SOLN: 650.0000 mg | ORAL | Status: DC | PRN

## 2021-09-16 MED ORDER — CLOPIDOGREL BISULFATE 75 MG PO TABS: 75.0000 mg | ORAL_TABLET | Freq: Every day | ORAL | Status: DC

## 2021-09-16 MED ORDER — IOHEXOL 350 MG/ML SOLN
80.0000 mL | Freq: Once | INTRAVENOUS | Status: AC | PRN
  Administered 2021-09-16: 80 mL via INTRAVENOUS

## 2021-09-16 NOTE — ED Provider Notes (Addendum)
[facility] EMERGENCY DEPARTMENT Provider Note   CSN: [id number] Arrival date & time: 09/16/21  1303     History  Chief Complaint  Patient presents with   Blurred Vision    Micah Noel, previous stroke    Jade [name] is a 66 y.o. female.  HPI She presents for evaluation of concern for stroke by family member, son in the room with her.  He states for the last 48 hours she has been different than usual, not tracking well with her eyes, having trouble coordinating use of things like television remote, complaining of blurred vision, and leaning to the left.  Typically she leans to the right, since her stroke caused right hemiparesis.  The patient is unable to give much history.  She does not disagree with the son's assessment.    Home Medications Prior to Admission medications   Medication Sig Start Date End Date Taking? Authorizing Provider  acetaminophen (TYLENOL) 500 MG tablet Take 1,000 mg by mouth at bedtime.   Yes [provider]  amLODipine (NORVASC) 10 MG tablet Take 10 mg by mouth daily. 08/29/21  Yes [provider]  aspirin EC 81 MG tablet Take 81 mg by mouth daily. Swallow whole.   Yes [provider]  atorvastatin (LIPITOR) 80 MG tablet Take 80 mg by mouth daily.   Yes [provider]  B Complex-C (B-COMPLEX WITH VITAMIN C) tablet Take 1 tablet by mouth daily.   Yes [provider]  cholecalciferol (VITAMIN D3) 25 MCG (1000 UNIT) tablet Take 1,000 Units by mouth daily.   Yes [provider]  clopidogrel (PLAVIX) 75 MG tablet Start taking after Brilinta is complete - Take 1 tablet (75 mg total) by mouth every morning. Patient taking differently: Take 75 mg by mouth daily. 07/23/21 10/21/21 Yes Demaio, Alexa, MD  dantrolene (DANTRIUM) 50 MG capsule Take 1 capsule (50 mg total) by mouth at bedtime. 08/11/21  Yes   loperamide (IMODIUM) 2 MG capsule Take 2 capsules (4 mg total) by mouth 2 (two) times  daily as needed for diarrhea or loose stools. 06/30/21  Yes Demaio, Alexa, MD  Multiple Vitamins-Minerals (MULTIVITAMIN ADULTS 50+ PO) Take 1 tablet by mouth daily.   Yes [provider]  Multiple Vitamins-Minerals (ZINC PO) Take 1 tablet by mouth daily.   Yes [provider]  tiZANidine (ZANAFLEX) 2 MG tablet Take 2 mg by mouth at bedtime.   Yes [provider]  atorvastatin (LIPITOR) 80 MG tablet Take 1 tablet (80 mg total) by mouth daily. 06/14/21 07/14/21  Milagros Loll, MD      Allergies    Baclofen and Nickel    Review of Systems   Review of Systems  Unable to perform ROS: Mental status change   Physical Exam Updated Vital Signs BP (!) 168/76    Pulse (!) 114    Temp 98.6 F (37 C)    Resp (!) 22    SpO2 100%  Physical Exam Vitals and nursing note reviewed.  Constitutional:      General: She is not in acute distress.    Appearance: She is well-developed. She is obese. She is not ill-appearing, toxic-appearing or diaphoretic.  HENT:     Head: Normocephalic and atraumatic.     Right Ear: External ear normal.     Left Ear: External ear normal.  Eyes:     Conjunctiva/sclera: Conjunctivae normal.     Pupils: Pupils are equal, round, and reactive to light.  Neck:  Trachea: Phonation normal.  Cardiovascular:     Rate and Rhythm: Regular rhythm. Tachycardia present.     Heart sounds: Normal heart sounds. No murmur heard. Pulmonary:     Effort: Pulmonary effort is normal. No respiratory distress.     Breath sounds: Normal breath sounds. No stridor.  Abdominal:     General: There is no distension.     Palpations: Abdomen is soft.     Tenderness: There is no abdominal tenderness.  Musculoskeletal:        General: Normal range of motion.     Cervical back: Normal range of motion and neck supple.  Skin:    General: Skin is warm and dry.     Comments: Large keratosis mid frontal scalp  Neurological:     Mental Status: She is alert.      Cranial Nerves: No cranial nerve deficit.     Motor: No abnormal muscle tone.     Coordination: Coordination normal.     Comments: No aphasia or ataxia.  Right hemiparesis moderate.  Disconjugate gaze with visual tracking to follow finger.  Right eye lags.  Psychiatric:        Mood and Affect: Mood normal.        Behavior: Behavior normal.    ED Results / Procedures / Treatments   Labs (all labs ordered are listed, but only abnormal results are displayed) Labs Reviewed  APTT - Abnormal; Notable for the following components:      Result Value   aPTT 22 (*)    All other components within normal limits  CBC - Abnormal; Notable for the following components:   RBC 2.51 (*)    Hemoglobin 7.7 (*)    HCT 24.6 (*)    All other components within normal limits  COMPREHENSIVE METABOLIC PANEL - Abnormal; Notable for the following components:   Glucose, Bld 155 (*)    Calcium 8.7 (*)    Total Protein 5.5 (*)    Albumin 3.2 (*)    All other components within normal limits  RESP PANEL BY RT-PCR (FLU A&B, COVID) ARPGX2  ETHANOL  PROTIME-INR  DIFFERENTIAL  RAPID URINE DRUG SCREEN, HOSP PERFORMED  URINALYSIS, ROUTINE W REFLEX MICROSCOPIC  HEMOGLOBIN A1C  LIPID PANEL    EKG EKG Interpretation  Date/Time:  Saturday September 16 2021 13:29:05 EST Ventricular Rate:  94 PR Interval:    QRS Duration: 115 QT Interval:  365 QTC Calculation: 457 R Axis:   -51 Text Interpretation: Sinus rhythm Incomplete left bundle branch block LVH with secondary repolarization abnormality since last tracing no significant change Reconfirmed by Mancel Bale [phone]) on 09/16/2021 8:35:39 PM  Radiology MR BRAIN WO CONTRAST  Result Date: 09/16/2021 CLINICAL DATA:  Initial evaluation for neuro deficit, stroke suspected. EXAM: MRI HEAD WITHOUT CONTRAST TECHNIQUE: Multiplanar, multiecho pulse sequences of the brain and surrounding structures were obtained without intravenous contrast. COMPARISON:  Prior CT and MRI  from 06/21/2021. FINDINGS: Brain: Examination mildly degraded by motion artifact. Age-related cerebral atrophy with advanced chronic microvascular ischemic disease. Remote posterior left MCA distribution infarct involving the left frontoparietal region. Scattered remote lacunar infarcts present about the hemispheric cerebral white matter and deep gray nuclei. Patchy areas of restricted diffusion involving the cortical and subcortical aspect of the right parieto-occipital region, consistent with acute ischemic infarcts, posterior right MCA and/or MCA/PCA watershed distribution (series 5, image 83). Largest area of infarction measures 1.8 cm at the periatrial white matter. No associated hemorrhage or mass effect. Additional mild patchy  diffusion abnormality seen at the posterior left splenium and adjacent subcortical left occipital lobe (series 5, images 79, 72), consistent with a few additional small acute to early subacute ischemic infarcts. No associated hemorrhage or mass effect at this location. Otherwise, no other evidence for new or interval infarction. No acute intracranial hemorrhage. Chronic blood products at the left anterior corpus callosum noted. Benign arachnoid cyst noted at the right middle cranial fossa. No other mass lesion, mass effect, or midline shift. No hydrocephalus or extra-axial fluid collection. Pituitary gland suprasellar region normal. Midline structures intact. Vascular: Major intracranial vascular flow voids are maintained. Skull and upper cervical spine: Craniocervical junction normal. Bone marrow signal intensity normal. A scalp soft tissue abnormality. Sinuses/Orbits: Right gaze noted. Moderate secretions noted within the left sphenoid sinus. Scattered mucosal thickening noted within the ethmoidal air cells. Mastoid air cells are clear. Other: None. IMPRESSION: 1. Patchy acute ischemic infarcts involving the right parieto-occipital region as above. No associated hemorrhage or mass  effect. 2. Additional small volume acute to early subacute ischemic infarcts involving the posterior left splenium and adjacent subcortical left occipital lobe. 3. Underlying age-related cerebral atrophy with advanced chronic microvascular ischemic disease, with multiple additional remote infarcts as above. Electronically Signed   By: Rise Mu M.D.   On: 09/16/2021 19:03    Procedures .Critical Care Performed by: Mancel Bale, MD Authorized by: Mancel Bale, MD   Critical care provider statement:    Critical care time (minutes):  55   Critical care start time:  09/16/2021 1:55 PM   Critical care end time:  09/16/2021 9:28 PM   Critical care time was exclusive of:  Separately billable procedures and treating other patients   Critical care was necessary to treat or prevent imminent or life-threatening deterioration of the following conditions:  CNS failure or compromise   Critical care was time spent personally by me on the following activities:  Blood draw for specimens, development of treatment plan with patient or surrogate, discussions with consultants, evaluation of patient's response to treatment, examination of patient, ordering and performing treatments and interventions, ordering and review of laboratory studies, ordering and review of radiographic studies, pulse oximetry, re-evaluation of patient's condition and review of old charts    Medications Ordered in ED Medications  LORazepam (ATIVAN) injection 0.5 mg (0.5 mg Intravenous Given 09/16/21 1757)   stroke: mapping our early stages of recovery book (has no administration in time range)  acetaminophen (TYLENOL) tablet 650 mg (has no administration in time range)    Or  acetaminophen (TYLENOL) 160 MG/5ML solution 650 mg (has no administration in time range)    Or  acetaminophen (TYLENOL) suppository 650 mg (has no administration in time range)  enoxaparin (LOVENOX) injection 40 mg (has no administration in time range)   aspirin suppository 300 mg (has no administration in time range)    Or  aspirin tablet 325 mg (has no administration in time range)  clopidogrel (PLAVIX) tablet 75 mg (has no administration in time range)  atorvastatin (LIPITOR) tablet 80 mg (has no administration in time range)  dantrolene (DANTRIUM) capsule 50 mg (has no administration in time range)  tiZANidine (ZANAFLEX) tablet 2 mg (has no administration in time range)    ED Course/ Medical Decision Making/ A&P Clinical Course as of 09/16/21 2129  Sat Sep 16, 2021  2036 Case discussed with the on-call neuro hospitalist, Dr. Amada Jupiter.  He will see as a Research scientist (medical) and agrees with hospitalist admission. [EW]    Clinical Course User  Index [EW] Mancel Bale, MD                           Medical Decision Making Amount and/or Complexity of Data Reviewed Labs: ordered. Radiology: ordered.  Risk Prescription drug management. Decision regarding hospitalization.    Patient Vitals for the past 24 hrs:  BP Temp Pulse Resp SpO2  09/16/21 2100 (!) 168/76 -- (!) 114 (!) 22 100 %  09/16/21 2030 (!) 157/72 -- (!) 125 17 98 %  09/16/21 2000 (!) 145/77 -- (!) 115 (!) 21 97 %  09/16/21 1930 (!) 181/73 -- (!) 113 19 99 %  09/16/21 1905 (!) 183/99 -- (!) 108 15 97 %  09/16/21 1745 (!) 147/73 -- (!) 107 14 99 %  09/16/21 1736 133/72 -- (!) 102 18 98 %  09/16/21 1630 127/75 -- (!) 102 13 98 %  09/16/21 1615 (!) 138/105 -- 96 16 99 %  09/16/21 1600 (!) 142/70 -- 92 15 99 %  09/16/21 1545 (!) 146/73 -- 93 15 99 %  09/16/21 1530 (!) 145/72 -- 92 18 100 %  09/16/21 1515 (!) 141/74 -- 92 16 99 %  09/16/21 1500 133/70 -- 86 15 99 %  09/16/21 1445 (!) 143/68 -- 93 15 99 %  09/16/21 1430 (!) 151/66 -- 90 17 100 %  09/16/21 1415 (!) 144/70 -- 84 17 100 %  09/16/21 1400 131/75 -- 87 17 100 %  09/16/21 1345 (!) 150/65 -- 86 13 100 %  09/16/21 1330 (!) 168/56 98.6 F (37 C) (!) 105 17 100 %  09/16/21 1320 -- -- -- -- 97 %    Medical  Decision Making: Summary of Illness/Injury: Patient presenting for symptoms that concern son that she is having a stroke.  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  She has been less able to care for self at home and requires more assistance.  She has been bedbound since her prior stroke and requires help to transfer from bed to chair.  She can typically stand for transfer.  Symptoms present for at least 48 hours.  She was not code stroke, on arrival.  Critical Interventions-clinical evaluation, laboratory testing, radiography, observation and reassessment; to evaluate  Chief Complaint  Patient presents with   Blurred Vision    Micah Noel, previous stroke    and assess for illness characterized as Acute, Previously Undiagnosed, Uncertain Prognosis, Complicated, Systemic Symptoms, and Threat to Life/Bodily Function   The Differential Diagnoses include CVA, encephalopathy, chronic illness with exacerbation.  I obtained  Additional Historical Information from Independent Historian son at the bedside , as the patient is a poor historian.  I decided to review pertinent External Data, and in summary she was seen by her PCP on 07/10/2021 to follow-up on hospital discharge.  This was following hospitalization for stroke.  She has right arm weakness with spasticity that was being monitored by them.  Durable medical goods were obtained for the home to help the patient.  Ongoing management of hypertension diabetes was undertaken   Clinical Laboratory Tests Ordered, included CBC, Metabolic panel, and alcohol level, viral panel . Review indicates normal except glucose high, calcium low, total protein low, albumin low. Emergent testing abnormality management required for stabilization-no  Radiologic Tests Ordered, included MRI brain.  I independently Visualized: Radiographic images, which show acute strokes, bilateral per report of radiologist  Cardiac Monitor Tracing which shows normal  sinus rhythm, indicating stable cardiac  rhythm    This patient is Presenting for Evaluation of recurrent symptoms of CVA, which does require a range of treatment options, and is a complaint that involves a high risk of morbidity and mortality.  Pharmaceutical Risk Management no     Treatment Complication Risk Evaluation indicates appropriate disposition isHospitalization for further evaluation and treatment of acute strokes  After These Interventions, the Patient was reevaluated and was found clinically unchanged, with multiple strokes on MRI imaging requiring hospitalization for further evaluation and treatment by neurosurgery.  No acute metabolic or infectious processes or change in chronic cardiac condition.    8:35 PM Reevaluation with update and discussion with patient and family member. After initial assessment and treatment, an updated evaluation reveals unchanged clinical status. Illness risk, progression of stroke syndrome, discussed. Mancel Bale    9:29 PM-Consult complete with hospitalist. Patient case explained and discussed. Requirement for hospitalization agreed on. Possible Risk of worsening symptoms.  He agrees to admit patient for further evaluation and treatment. Call ended at 8:50 PM  CRITICAL CARE-yes Performed by: Mancel Bale              Final Clinical Impression(s) / ED Diagnoses Final diagnoses:  Cerebrovascular accident (CVA), unspecified mechanism (HCC)  Tachycardia    Rx / DC Orders ED Discharge Orders     None         Mancel Bale, MD 09/16/21 2127    Mancel Bale, MD 09/16/21 2129

## 2021-09-16 NOTE — ED Notes (Signed)
Patient transported to CT 

## 2021-09-16 NOTE — Consult Note (Signed)
Neurology Consultation Reason for Consult: Stroke Referring Physician: Rico Ala  CC: Stroke  History is obtained from: Patient  HPI: Jade [name] is a 66 y.o. female with a history of hypertension, hyperlipidemia, previous stroke who presents with vision problems and confusion that is been going on for 3 days.  She had a stroke in August of last year, which appeared embolic and was found to have significant stenosis at the carotid origin.  She was stented and has been on aspirin and Plavix since that time.  She woke up with visual change and confusion 3 days ago, and since it was not getting better she sought care in the emergency department today.  Of note, she has a significant hemoglobin drop without symptoms, denies melena, denies bloody stools, denies frequent bowel movements or other symptoms of GI bleed.   LKW: 3 days ago tpa given?: no, outside of window   ROS: A 14 point ROS was performed and is negative except as noted in the HPI.    Past Medical History:  Diagnosis Date   Arthritis    "back, knees, some in my left hip" (03/21/2018)   Chronic kidney disease    "was told I had 25% kidney function in early 2012" (03/21/2018)   COPD (chronic obstructive pulmonary disease) (HCC)    CVA (cerebral vascular accident) (HCC) 03/21/2018   "numb all over; head to toe" (03/21/2018)   Fibromyalgia    History of gout    Hyperlipidemia    Hypertension    Migraine    "used to get them monthly during menopause" (03/21/2018)   Neuromuscular disorder (HCC)    OSA (obstructive sleep apnea)    "don't tolerate the machine" (03/21/2018)   Pneumonia ~ 2007   Type 2 diabetes, diet controlled (HCC)      Family History  Problem Relation Age of Onset   Heart disease Father    Leukemia Father    Alcohol abuse Son    Other Mother    Cancer Neg Hx    Diabetes Neg Hx    Hearing loss Neg Hx    Hyperlipidemia Neg Hx    Hypertension Neg Hx    Kidney disease Neg Hx    Stroke Neg Hx       Social History:  reports that she quit smoking about 34 years ago. Her smoking use included cigarettes. She has a 20.00 pack-year smoking history. She has never used smokeless tobacco. She reports that she does not currently use alcohol. She reports that she does not currently use drugs after having used the following drugs: Marijuana.   Exam: Current vital signs: BP (!) 168/76    Pulse (!) 114    Temp 98.6 F (37 C)    Resp (!) 22    SpO2 100%  Vital signs in last 24 hours: Temp:  [98.6 F (37 C)] 98.6 F (37 C) (01/21 1330) Pulse Rate:  [84-125] 114 (01/21 2100) Resp:  [13-22] 22 (01/21 2100) BP: (127-183)/(56-105) 168/76 (01/21 2100) SpO2:  [97 %-100 %] 100 % (01/21 2100)   Physical Exam  Constitutional: Appears well-developed and well-nourished.  Psych: Affect appropriate to situation Eyes: No scleral injection HENT: No OP obstruction MSK: no joint deformities.  Cardiovascular: Normal rate and regular rhythm.  Respiratory: Effort normal, non-labored breathing GI: Soft.  No distension. There is no tenderness.  Skin: WDI  Neuro: Mental Status: Patient is awake, alert, she gives the month is September, year as 2023, age is 9 No signs  of aphasia or neglect She is unable to spell world backwards Cranial Nerves: II: She is able to count fingers in the right upper visual field, unable to in all other fields. Pupils are equal, round, and reactive to light.   III,IV, VI: EOMI without ptosis or diploplia.  V: Facial sensation is symmetric to temperature VII: Facial movement is symmetric.  VIII: hearing is intact to voice X: Uvula elevates symmetrically XI: Shoulder shrug is symmetric. XII: tongue is midline without atrophy or fasciculations.  Motor: She has 2/5 spastic hemiparesis in the right upper extremity, 4/5 in the right lower extremity.  Good strength on the left. Sensory: Sensation is symmetric to light touch and temperature in legs, in the arms she feels it  less on the right than the left Cerebellar: Intact finger-nose-finger on the left, unable to perform on the right     I have reviewed labs in epic and the results pertinent to this consultation are: Hemoglobin 7.7 Creatinine 0.8  I have reviewed the images obtained: MRI brain-multifocal posterior circulation infarcts, most consistent with embolic source from proximal to the basilar.  Impression: 66 year old female with multifocal posterior circulation infarcts.  She presented with embolic appearing infarcts in August of last year as well, which were felt to be due to carotid origin stenosis treated with stenting.  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  I favor, however, embolus as etiology.  Consider blood transfusion if patient becomes agreeable.  Recommendations: - HgbA1c, fasting lipid panel - Frequent neuro checks - Echocardiogram - CTA head and neck - Prophylactic therapy-none given evidence of active bleeding, though if she remained stable, may need to consider at least aspirin given her stent - Risk factor modification - Telemetry monitoring - PT consult, OT consult, Speech consult - Stroke team to follow    Ritta Slot, MD [facility] [phone]  If 7pm- 7am, please page neurology on call as listed in AMION.

## 2021-09-16 NOTE — ED Notes (Signed)
Admitting MD and neuro at bedside

## 2021-09-16 NOTE — ED Triage Notes (Signed)
Pt from home, reports symptoms of weakness, headache, blurry vision right eye, previous cva with right sided deficits, unable to ambulate.  Last known well, Thursday morning around 8am.  20g L AC, cbg 247,  ns 248ml given, sat 97%, 12lead unremarkable.  Alert, oriented,

## 2021-09-16 NOTE — Progress Notes (Addendum)
Repeat HGB 5.9 confirming major HGB drop.  Recommended PRBC transfusion to son and patient, explained that patient could die without this.  At this time patient, who is not a Jehova's witness but previously has indicated she would decline blood products (see blood products refusal in chart FYI 03/06/21); indicates that she would STILL refuse blood products.  This decision IS consistent with patients prior beliefs, personality, etc.  For example, It literally took multiple ischemic strokes before we could even convince her to take a statin.  Also she has consistently refused COVID vaccine, etc.  Son confirms this.  Have discussed with Dr. Amada Jupiter and Son.  At this point we all do feel patient can make this decision and will honor her wishes.  I have told son that we would honor her wishes, he does not object.  Will send type and screen in case she changes her mind, also for the coombs test, will also get haptoglobin and LDH.  Ordering protonix 40mg  IV BID.  Holding ASA + Plavix due to bleed risk.  RN to call with next stool so we can get hemoccult.  No BM in 24h though.

## 2021-09-16 NOTE — ED Notes (Signed)
Patient transported to MRI 

## 2021-09-16 NOTE — H&P (Addendum)
History and Physical    [name] DVV:[id number] DOB: [birth year] DOA: 09/16/2021  PCP: Nathaneil Canary, PA-C  Patient coming from: Home  I have personally briefly reviewed patient's old medical records in [facility]  Chief Complaint: AMS  HPI: Jade [name] is a 66 y.o. female with medical history significant of HTN, DM2, severe CVD, multiple prior strokes since July 2022.  HLD prior non-compliance with statins.  Carotid stenosis on L s/p stent in Aug 2022.  For past 48-72h son has noticed pt different than usual, not tracking well with her eyes, having trouble coordinating use of things like TV remote, blurred vision, leaning to left (usually leans to right since her prior stroke caused R hemiparesis).   ED Course: MRI today reveals multiple acute and subacute strokes.  Mostly posterior circulation on the R but also acute to subacute strokes posterior circulation on L (see MRI for details).  HGB 7.7 down from 15 in Dec  Per pt and son: No melena No hematochezia BMs normal, not dark No diarrhea recently No BM in past 24h   Past Medical History:  Diagnosis Date   Arthritis    "back, knees, some in my left hip" (03/21/2018)   Chronic kidney disease    "was told I had 25% kidney function in early 2012" (03/21/2018)   COPD (chronic obstructive pulmonary disease) (HCC)    CVA (cerebral vascular accident) (HCC) 03/21/2018   "numb all over; head to toe" (03/21/2018)   Fibromyalgia    History of gout    Hyperlipidemia    Hypertension    Migraine    "used to get them monthly during menopause" (03/21/2018)   Neuromuscular disorder (HCC)    OSA (obstructive sleep apnea)    "don't tolerate the machine" (03/21/2018)   Pneumonia ~ 2007   Type 2 diabetes, diet controlled (HCC)     Past Surgical History:  Procedure Laterality Date   HERNIA REPAIR     LAPAROSCOPIC CHOLECYSTECTOMY  06/2007   "no UHR w/this" (03/21/2018)   TONSILLECTOMY     TRANSCAROTID ARTERY  REVASCULARIZATION  Left 04/06/2021   Procedure: LEFT TRANSCAROTID ARTERY REVASCULARIZATION;  Surgeon: Leonie Douglas, MD;  Location: [facility] OR;  Service: Vascular;  Laterality: Left;   ULTRASOUND GUIDANCE FOR VASCULAR ACCESS Right 04/06/2021   Procedure: ULTRASOUND GUIDANCE FOR VASCULAR ACCESS;  Surgeon: Leonie Douglas, MD;  Location: [facility] OR;  Service: Vascular;  Laterality: Right;   UMBILICAL HERNIA REPAIR  2009     reports that she quit smoking about 34 years ago. Her smoking use included cigarettes. She has a 20.00 pack-year smoking history. She has never used smokeless tobacco. She reports that she does not currently use alcohol. She reports that she does not currently use drugs after having used the following drugs: Marijuana.  Allergies  Allergen Reactions   Baclofen Other (See Comments)    Pt said felt weaker and had slurred speech   Nickel Dermatitis and Rash    Family History  Problem Relation Age of Onset   Heart disease Father    Leukemia Father    Alcohol abuse Son    Other Mother    Cancer Neg Hx    Diabetes Neg Hx    Hearing loss Neg Hx    Hyperlipidemia Neg Hx    Hypertension Neg Hx    Kidney disease Neg Hx    Stroke Neg Hx     Prior to Admission medications   Medication Sig Start Date  End Date Taking? Authorizing Provider  acetaminophen (TYLENOL) 500 MG tablet Take 1,000 mg by mouth at bedtime.   Yes [provider]  amLODipine (NORVASC) 10 MG tablet Take 10 mg by mouth daily. 08/29/21  Yes [provider]  aspirin EC 81 MG tablet Take 81 mg by mouth daily. Swallow whole.   Yes [provider]  atorvastatin (LIPITOR) 80 MG tablet Take 80 mg by mouth daily.   Yes [provider]  B Complex-C (B-COMPLEX WITH VITAMIN C) tablet Take 1 tablet by mouth daily.   Yes [provider]  cholecalciferol (VITAMIN D3) 25 MCG (1000 UNIT) tablet Take 1,000 Units by mouth daily.   Yes [provider]  clopidogrel (PLAVIX) 75 MG  tablet Start taking after Brilinta is complete - Take 1 tablet (75 mg total) by mouth every morning. Patient taking differently: Take 75 mg by mouth daily. 07/23/21 10/21/21 Yes Demaio, Alexa, MD  dantrolene (DANTRIUM) 50 MG capsule Take 1 capsule (50 mg total) by mouth at bedtime. 08/11/21  Yes   loperamide (IMODIUM) 2 MG capsule Take 2 capsules (4 mg total) by mouth 2 (two) times daily as needed for diarrhea or loose stools. 06/30/21  Yes Demaio, Alexa, MD  Multiple Vitamins-Minerals (MULTIVITAMIN ADULTS 50+ PO) Take 1 tablet by mouth daily.   Yes [provider]  Multiple Vitamins-Minerals (ZINC PO) Take 1 tablet by mouth daily.   Yes [provider]  tiZANidine (ZANAFLEX) 2 MG tablet Take 2 mg by mouth at bedtime.   Yes [provider]  atorvastatin (LIPITOR) 80 MG tablet Take 1 tablet (80 mg total) by mouth daily. 06/14/21 07/14/21  Milagros Loll, MD    Physical Exam: Vitals:   09/16/21 1930 09/16/21 2000 09/16/21 2030 09/16/21 2100  BP: (!) 181/73 (!) 145/77 (!) 157/72 (!) 168/76  Pulse: (!) 113 (!) 115 (!) 125 (!) 114  Resp: 19 (!) 21 17 (!) 22  Temp:      SpO2: 99% 97% 98% 100%    Constitutional: NAD, calm, comfortable Eyes: PERRL, lids and conjunctivae normal ENMT: Mucous membranes are moist. Posterior pharynx clear of any exudate or lesions.Normal dentition.  Neck: normal, supple, no masses, no thyromegaly Respiratory: clear to auscultation bilaterally, no wheezing, no crackles. Normal respiratory effort. No accessory muscle use.  Cardiovascular: Tachycardic Abdomen: NT, no masses palpated. No hepatosplenomegaly. Bowel sounds positive.  Musculoskeletal: no clubbing / cyanosis. No joint deformity upper and lower extremities. Good ROM, no contractures. Normal muscle tone.  Skin: large keratosis mid frontal scalp Neurologic: Dysconjugate gaze, R eye lag, seems to have some expressive aphasia, has visual field deficits.  R sided weakness from prior  stroke Psychiatric: Normal judgment and insight. Alert and oriented x 3. Normal mood.    Labs on Admission: I have personally reviewed following labs and imaging studies  CBC: Recent Labs  Lab 09/16/21 1423  WBC 6.6  NEUTROABS 4.9  HGB 7.7*  HCT 24.6*  MCV 98.0  PLT 372   Basic Metabolic Panel: Recent Labs  Lab 09/16/21 1423  NA 138  K 3.5  CL 107  CO2 23  GLUCOSE 155*  BUN 21  CREATININE 0.80  CALCIUM 8.7*   GFR: CrCl cannot be calculated (Unknown ideal weight.). Liver Function Tests: Recent Labs  Lab 09/16/21 1423  AST 17  ALT 13  ALKPHOS 57  BILITOT 0.3  PROT 5.5*  ALBUMIN 3.2*   No results for input(s): LIPASE, AMYLASE in the last 168 hours. No results for  input(s): AMMONIA in the last 168 hours. Coagulation Profile: Recent Labs  Lab 09/16/21 1423  INR 1.0   Cardiac Enzymes: No results for input(s): CKTOTAL, CKMB, CKMBINDEX, TROPONINI in the last 168 hours. BNP (last 3 results) No results for input(s): PROBNP in the last 8760 hours. HbA1C: No results for input(s): HGBA1C in the last 72 hours. CBG: No results for input(s): GLUCAP in the last 168 hours. Lipid Profile: No results for input(s): CHOL, HDL, LDLCALC, TRIG, CHOLHDL, LDLDIRECT in the last 72 hours. Thyroid Function Tests: No results for input(s): TSH, T4TOTAL, FREET4, T3FREE, THYROIDAB in the last 72 hours. Anemia Panel: No results for input(s): VITAMINB12, FOLATE, FERRITIN, TIBC, IRON, RETICCTPCT in the last 72 hours. Urine analysis:    Component Value Date/Time   COLORURINE YELLOW 06/17/2021 1743   APPEARANCEUR HAZY (A) 06/17/2021 1743   LABSPEC 1.017 06/17/2021 1743   PHURINE 6.0 06/17/2021 1743   GLUCOSEU NEGATIVE 06/17/2021 1743   GLUCOSEU NEGATIVE 03/08/2015 0855   HGBUR NEGATIVE 06/17/2021 1743   BILIRUBINUR NEGATIVE 06/17/2021 1743   KETONESUR 5 (A) 06/17/2021 1743   PROTEINUR NEGATIVE 06/17/2021 1743   UROBILINOGEN 0.2 03/08/2015 0855   NITRITE POSITIVE (A) 06/17/2021  1743   LEUKOCYTESUR MODERATE (A) 06/17/2021 1743    Radiological Exams on Admission: MR BRAIN WO CONTRAST  Result Date: 09/16/2021 CLINICAL DATA:  Initial evaluation for neuro deficit, stroke suspected. EXAM: MRI HEAD WITHOUT CONTRAST TECHNIQUE: Multiplanar, multiecho pulse sequences of the brain and surrounding structures were obtained without intravenous contrast. COMPARISON:  Prior CT and MRI from 06/21/2021. FINDINGS: Brain: Examination mildly degraded by motion artifact. Age-related cerebral atrophy with advanced chronic microvascular ischemic disease. Remote posterior left MCA distribution infarct involving the left frontoparietal region. Scattered remote lacunar infarcts present about the hemispheric cerebral white matter and deep gray nuclei. Patchy areas of restricted diffusion involving the cortical and subcortical aspect of the right parieto-occipital region, consistent with acute ischemic infarcts, posterior right MCA and/or MCA/PCA watershed distribution (series 5, image 83). Largest area of infarction measures 1.8 cm at the periatrial white matter. No associated hemorrhage or mass effect. Additional mild patchy diffusion abnormality seen at the posterior left splenium and adjacent subcortical left occipital lobe (series 5, images 79, 72), consistent with a few additional small acute to early subacute ischemic infarcts. No associated hemorrhage or mass effect at this location. Otherwise, no other evidence for new or interval infarction. No acute intracranial hemorrhage. Chronic blood products at the left anterior corpus callosum noted. Benign arachnoid cyst noted at the right middle cranial fossa. No other mass lesion, mass effect, or midline shift. No hydrocephalus or extra-axial fluid collection. Pituitary gland suprasellar region normal. Midline structures intact. Vascular: Major intracranial vascular flow voids are maintained. Skull and upper cervical spine: Craniocervical junction normal.  Bone marrow signal intensity normal. A scalp soft tissue abnormality. Sinuses/Orbits: Right gaze noted. Moderate secretions noted within the left sphenoid sinus. Scattered mucosal thickening noted within the ethmoidal air cells. Mastoid air cells are clear. Other: None. IMPRESSION: 1. Patchy acute ischemic infarcts involving the right parieto-occipital region as above. No associated hemorrhage or mass effect. 2. Additional small volume acute to early subacute ischemic infarcts involving the posterior left splenium and adjacent subcortical left occipital lobe. 3. Underlying age-related cerebral atrophy with advanced chronic microvascular ischemic disease, with multiple additional remote infarcts as above. Electronically Signed   By: Rise Mu M.D.   On: 09/16/2021 19:03    EKG: Independently reviewed.  Assessment/Plan Principal Problem:   Acute arterial  ischemic stroke, multifocal, posterior circulation, unspecified laterality (HCC) Active Problems:   Diabetes mellitus type 2 with neurological manifestations (HCC)   Pure hypercholesterolemia   Essential hypertension, benign    Acute ischemic strokes - Stroke pathway Neuro consult Tele monitor 2d echo CTA head and neck PT/OT/SLP ASA 81+Plavix for the moment Acute anemia - Worried that the large HGB drop since dec (15.4 to 7.7) may be causing strokes in setting of her known severe atherosclerotic CVD Neurology agrees Checking repeat H/H stat to confirm this is real If confirmed on H/H then will need to transfuse 2u PRBC Get CT AP to look for retroperitoneal hematoma Hold plavix but continue ASA in acute strokes per neuro Hemoccult ordered with next BM, but no stigmata of GIB and no BM in over 24h per son and pt. Checking haptoglobin Repeat CBC tomorrow AM ordered HTN - Hold home BP meds and allow permissive HTN in setting of acute strokes HLD - Cont Lipitor  Prior to first stroke last year she had refused statins  entirely. Pt now reports compliance with statins however Check FLP DM2 - diet controlled A1C ranging 5.7-6.5 over past year Doesn't seem to be the main driver of her severe atherosclerotic CVD  DVT prophylaxis: SCDs Code Status: DNR Family Communication: Son at bedside Disposition Plan: TBD Consults called: Neuro Admission status: Admit to inpatient  Severity of Illness: The appropriate patient status for this patient is INPATIENT. Inpatient status is judged to be reasonable and necessary in order to provide the required intensity of service to ensure the patient's safety. The patient's presenting symptoms, physical exam findings, and initial radiographic and laboratory data in the context of their chronic comorbidities is felt to place them at high risk for further clinical deterioration. Furthermore, it is not anticipated that the patient will be medically stable for discharge from the hospital within 2 midnights of admission.   * I certify that at the point of admission it is my clinical judgment that the patient will require inpatient hospital care spanning beyond 2 midnights from the point of admission due to high intensity of service, high risk for further deterioration and high frequency of surveillance required.*   Raistlin Gum M. DO [facility]  How to contact the [facility] Attending or Consulting provider 7A - 7P or covering provider during after hours 7P -7A, for this patient?  Check the care team in [facility] and look for a) attending/consulting [facility] provider listed and b) the [facility] team listed Log into [email or web address]  Amion Physician Scheduling and messaging for groups and whole hospitals  On call and physician scheduling software for group practices, residents, hospitalists and other medical providers for call, clinic, rotation and shift schedules. On[employer] is a hospital-wide system for scheduling doctors and paging doctors on call. EasyPlot is for scientific plotting and  data analysis.  [email or web address]  and use [place]'s universal password to access. If you do not have the password, please contact the hospital operator.  Locate the [facility] provider you are looking for under [facility] and page to a number that you can be directly reached. If you still have difficulty reaching the provider, please page the [facility] (Director on Call) for the Hospitalists listed on amion for assistance.  09/16/2021, 9:33 PM

## 2021-09-17 ENCOUNTER — Inpatient Hospital Stay (HOSPITAL_COMMUNITY): Payer: Medicare Other

## 2021-09-17 DIAGNOSIS — I63539 Cerebral infarction due to unspecified occlusion or stenosis of unspecified posterior cerebral artery: Secondary | ICD-10-CM | POA: Diagnosis not present

## 2021-09-17 DIAGNOSIS — I6389 Other cerebral infarction: Secondary | ICD-10-CM

## 2021-09-17 LAB — DIRECT ANTIGLOBULIN TEST (NOT AT ARMC)
DAT, IgG: NEGATIVE
DAT, complement: NEGATIVE

## 2021-09-17 LAB — ECHOCARDIOGRAM COMPLETE
AR max vel: 1.91 cm2
AV Area VTI: 1.88 cm2
AV Area mean vel: 1.93 cm2
AV Mean grad: 7 mmHg
AV Peak grad: 13 mmHg
Ao pk vel: 1.8 m/s
Area-P 1/2: 4.6 cm2
S' Lateral: 2.7 cm

## 2021-09-17 LAB — PREPARE RBC (CROSSMATCH)

## 2021-09-17 LAB — CBC
HCT: 17 % — ABNORMAL LOW (ref 36.0–46.0)
Hemoglobin: 5.3 g/dL — CL (ref 12.0–15.0)
MCH: 30.5 pg (ref 26.0–34.0)
MCHC: 31.2 g/dL (ref 30.0–36.0)
MCV: 97.7 fL (ref 80.0–100.0)
Platelets: 459 10*3/uL — ABNORMAL HIGH (ref 150–400)
RBC: 1.74 MIL/uL — ABNORMAL LOW (ref 3.87–5.11)
RDW: 14.6 % (ref 11.5–15.5)
WBC: 7.7 10*3/uL (ref 4.0–10.5)
nRBC: 0 % (ref 0.0–0.2)

## 2021-09-17 LAB — LIPID PANEL
Cholesterol: 126 mg/dL (ref 0–200)
HDL: 43 mg/dL (ref >40)
LDL Cholesterol: 57 mg/dL (ref 0–99)
Total CHOL/HDL Ratio: 2.9 RATIO
Triglycerides: 128 mg/dL (ref <150)
VLDL: 26 mg/dL (ref 0–40)

## 2021-09-17 LAB — RAPID URINE DRUG SCREEN, HOSP PERFORMED
Amphetamines: NOT DETECTED
Barbiturates: NOT DETECTED
Benzodiazepines: NOT DETECTED
Cocaine: NOT DETECTED
Opiates: NOT DETECTED
Tetrahydrocannabinol: NOT DETECTED

## 2021-09-17 LAB — URINALYSIS, ROUTINE W REFLEX MICROSCOPIC
Bilirubin Urine: NEGATIVE
Glucose, UA: NEGATIVE mg/dL
Hgb urine dipstick: NEGATIVE
Ketones, ur: NEGATIVE mg/dL
Nitrite: NEGATIVE
Protein, ur: NEGATIVE mg/dL
Specific Gravity, Urine: >1.046 — ABNORMAL HIGH (ref 1.005–1.030)
pH: 5 (ref 5.0–8.0)

## 2021-09-17 LAB — LACTATE DEHYDROGENASE: LDH: 142 U/L (ref 98–192)

## 2021-09-17 LAB — VITAMIN B12: Vitamin B-12: 370 pg/mL (ref 180–914)

## 2021-09-17 LAB — HIV ANTIBODY (ROUTINE TESTING W REFLEX): HIV Screen 4th Generation wRfx: NONREACTIVE

## 2021-09-17 LAB — IRON AND TIBC
Iron: 195 ug/dL — ABNORMAL HIGH (ref 28–170)
Saturation Ratios: 55 % — ABNORMAL HIGH (ref 10.4–31.8)
TIBC: 356 ug/dL (ref 250–450)
UIBC: 161 ug/dL

## 2021-09-17 LAB — RETICULOCYTES
Immature Retic Fract: 24.8 % — ABNORMAL HIGH (ref 2.3–15.9)
RBC.: 2.78 MIL/uL — ABNORMAL LOW (ref 3.87–5.11)
Retic Count, Absolute: 116.2 10*3/uL (ref 19.0–186.0)
Retic Ct Pct: 4.2 % — ABNORMAL HIGH (ref 0.4–3.1)

## 2021-09-17 LAB — FOLATE: Folate: 14 ng/mL (ref >5.9)

## 2021-09-17 MED ORDER — SODIUM CHLORIDE 0.9% IV SOLUTION: Freq: Once | INTRAVENOUS | Status: DC

## 2021-09-17 NOTE — ED Notes (Signed)
Pt's son is at bedside and signed pt's refusal of blood products form. Pt was unable to sign for self due to hx of strokes and limited mobility in bilateral hands.

## 2021-09-17 NOTE — ED Notes (Signed)
RN updates son, [name]

## 2021-09-17 NOTE — Progress Notes (Addendum)
STROKE TEAM PROGRESS NOTE   ATTENDING NOTE: I reviewed above note and agree with the assessment and plan. Pt was seen and examined.   66 year old female with history of hypertension, hyperlipidemia, diabetes, CKD, stroke, left carotid stenosis status post TCAR 03/2021 admitted for vision changes and confusion for 3 days.  Hx of stroke 05/2021 admitted for generalized weakness.  MRI showed subacute bilateral corpus callosum infarcts due to bilateral PCA known high-grade stenosis.  CTA head and neck showed severe stenosis bilateral P2 and bilateral MCA branch vessels.  Repeat MRI showed 3 total new punctate infarcts at the bilateral hippocampi and left parieto-occipital lobe, consistent with high-grade PCA stenosis in the setting of hypotension.  EF initial report 25 to 30%, however later corrected to 40 to 45%.  LDL 133, A1c 5.7.  Patient was put on aspirin and Brilinta for 30 days and then back to aspirin and Plavix.  Patient discharged to [facility]. 03/2021 admitted for right-sided weakness.  CT no acute abnormality.  CTA head and neck showed 75% stenosis of proximal left ICA.  Bilateral PCA high-grade stenosis.  MRI showed small acute infarct at the left frontoparietal junction.  Additional small infarct adjacent to the frontal horn of the left lateral ventricle.  Status post left TCAR 04/06/2021.  Postprocedure increase right leg weakness.  CT head and neck unchanged except interval left ICA stent, patent with 45% stenosis.  MRI on 8/12 showed interval enlargement/expansion of the previous left frontoparietal infarct, likely due to periprocedural fluctuating BP.  LDL 78, A1c 6.5.  Discharged with aspirin 325 and Plavix 75 for 3 months and then Plavix alone. Admitted in 02/2021 for blurry vision and hypertension, MRI showed left thalamic small infarcts.  CTA head and neck showed left ICA 75% stenosis.  MRA head showed severe distal right M2, and right P2 stenosis.  A1c 6.4, LDL 184.  EF 55 to 60%, creatinine 1.10.   She was discharged with aspirin 81 Plavix 75 DAPT for 3 weeks and then Plavix alone, as well as Lipitor 80. Admitted 02/2018 with left thalamic/internal capsule infarct.  EF 60 to 65%, LDL 130, A1c 5.8.  CT head and neck at that time left ICA 80% stenosis.  Referred her to vascular surgery and was planning to do left CEA, however patient canceled the procedure herself.  She then followed with her PCP and referred to see a neurologist Jade [name] in [place], had carotid Doppler on 02/20/2021 which reported as unremarkable.  On this admission, CT head and neck similar finding as before, left carotid stent patent, right ICA <50% stenosis, bilateral severe P2 stenosis, severe atheromatous disease involving the bilateral MCA branches without proximal occlusion.  MRI showed multifocal acute infarcts in the right MCA, left PCA, and bilateral MCA/PCA border zone.  EF 60 to 65%, LDL 133, A1c pending.  However, she was found to be severely anemic, hemoglobin 5.3.  No clear bleeding source so far.  On exam, patient awake alert, orientated x3.  No aphasia, follows simple commands.  Left hemianopia noted.  Still has right hemiparesis from previous stroke.  Etiology for patient stroke likely due to multifocal intracranial stenosis in the setting of severe anemia.  Agree with PRBC transfusion and oncology consultation.  Hold off antiplatelet for now given severe anemia needing PRBC transfusion, once hemoglobin stabilized and improved, may consider resume aspirin 81 only.  Continue statin.  We will follow.  For detailed assessment and plan, please refer to above as I have made changes wherever appropriate.  Jade Hawking, MD PhD Stroke Neurology 09/17/2021 6:44 PM  I discussed with Dr. Wynelle [name]. I spent extensive time in counseling and coordination of care, reviewing test results, images and medication, and discussing the diagnosis, treatment plan and potential prognosis. This patient's care requiresreview of multiple  databases, neurological assessment, discussion with family, other specialists and medical decision making of high complexity.       INTERVAL HISTORY Her husband and son are at the bedside.  Son reports having acute worsening of vision and difficulty with coordination. While this has somewhat improved, patient still has significant anemia for which she has been refusing blood products even with her Hgb ~5. We discussed how her low hemoglobin is likely the cause of her stroke this time as evidenced by her acute infarcts seen on MRI. Discussed the importance of getting a blood transfusion so that she can be more hemodynamically stable while she is getting her anemia workup. Patient finally agreed to blood transfusion.   Son reports patient still has some vision and coordination problems.  Vitals:   09/17/21 0500 09/17/21 0530 09/17/21 0600 09/17/21 0700  BP: 130/88 130/78 (!) 153/77 (!) 150/78  Pulse: 90 85 87 90  Resp: 19 20 19 12   Temp:      SpO2: 97% 99% 90% 98%   CBC:  Recent Labs  Lab 09/16/21 1423 09/16/21 2211 09/17/21 0426  WBC 6.6  --  7.7  NEUTROABS 4.9  --   --   HGB 7.7* 5.9* 5.3*  HCT 24.6* 18.4* 17.0*  MCV 98.0  --  97.7  PLT 372  --  [id number]   Basic Metabolic Panel:  Recent Labs  Lab 09/16/21 1423  NA 138  K 3.5  CL 107  CO2 23  GLUCOSE 155*  BUN 21  CREATININE 0.80  CALCIUM 8.7*   Lipid Panel:  Recent Labs  Lab 09/17/21 0426  CHOL 126  TRIG 128  HDL 43  CHOLHDL 2.9  VLDL 26  LDLCALC 57   HgbA1c: No results for input(s): HGBA1C in the last 168 hours. Urine Drug Screen: No results for input(s): LABOPIA, COCAINSCRNUR, LABBENZ, AMPHETMU, THCU, LABBARB in the last 168 hours.  Alcohol Level  Recent Labs  Lab 09/16/21 1423  ETH <10    IMAGING past 24 hours CT ABDOMEN PELVIS W WO CONTRAST  Result Date: 09/16/2021 CLINICAL DATA:  Progressive anemia, retroperitoneal hemorrhage EXAM: CT ABDOMEN AND PELVIS WITHOUT AND WITH CONTRAST TECHNIQUE:  Multidetector CT imaging of the abdomen and pelvis was performed following the standard protocol before and following the bolus administration of intravenous contrast. RADIATION DOSE REDUCTION: This exam was performed according to the departmental dose-optimization program which includes automated exposure control, adjustment of the mA and/or kV according to patient size and/or use of iterative reconstruction technique. CONTRAST:  38mL OMNIPAQUE IOHEXOL 350 MG/ML SOLN, 169mL OMNIPAQUE IOHEXOL 300 MG/ML SOLN COMPARISON:  09/22/2019 FINDINGS: Lower chest: No acute abnormality. Hepatobiliary: Status post cholecystectomy. Stable dystrophic calcifications within the gallbladder fossa. No intra or extrahepatic biliary ductal dilation. Liver unremarkable. Pancreas: Unremarkable Spleen: Unremarkable Adrenals/Urinary Tract: Adrenal glands are unremarkable. Kidneys are normal, without renal calculi, focal lesion, or hydronephrosis. Bladder is unremarkable. Stomach/Bowel: Stomach is within normal limits. Appendix appears normal. No evidence of bowel wall thickening, distention, or inflammatory changes. Vascular/Lymphatic: Aortic atherosclerosis. No enlarged abdominal or pelvic lymph nodes. Reproductive: Uterus and bilateral adnexa are unremarkable. Other: No retroperitoneal mass or hemorrhage identified. No abdominal wall hernia. The rectum is unremarkable. Musculoskeletal: No acute bone abnormality. Remote  anterior wedge compression fractures of T12 and L1 are identified with minimal retropulsion. Remote superior endplate fracture of L4 noted. No lytic or blastic bone lesion. IMPRESSION: No radiographic explanation for the patient's progressive anemia. No retroperitoneal hemorrhage identified. No acute intra-abdominal pathology identified. Aortic Atherosclerosis (ICD10-I70.0). Electronically Signed   By: Fidela Salisbury M.D.   On: 09/16/2021 23:51   CT ANGIO HEAD W OR WO CONTRAST  Result Date: 09/16/2021 CLINICAL DATA:   Follow-up examination for stroke. EXAM: CT ANGIOGRAPHY HEAD AND NECK TECHNIQUE: Multidetector CT imaging of the head and neck was performed using the standard protocol during bolus administration of intravenous contrast. Multiplanar CT image reconstructions and MIPs were obtained to evaluate the vascular anatomy. Carotid stenosis measurements (when applicable) are obtained utilizing NASCET criteria, using the distal internal carotid diameter as the denominator. RADIATION DOSE REDUCTION: This exam was performed according to the departmental dose-optimization program which includes automated exposure control, adjustment of the mA and/or kV according to patient size and/or use of iterative reconstruction technique. CONTRAST:  27mL OMNIPAQUE IOHEXOL 350 MG/ML SOLN COMPARISON:  MRI from earlier the same day as well is multiple previous CTAs. FINDINGS: CT HEAD FINDINGS Brain: Scattered small volume acute to subacute ischemic infarcts better evaluated on prior MRI performed immediately prior on the same day. No intracranial hemorrhage. Chronic left posterior MCA distribution infarct. Underlying severe chronic microvascular ischemic disease. Right middle cranial fossa arachnoid cyst. No other mass lesion or mass effect. Stable ventricular size without hydrocephalus. No extra-axial collection. Vascular: No hyperdense vessel. Skull: Soft tissue protuberance at the scalp vertex, indeterminate, but similar to previous (series 5, image 71). Calvarium intact. Sinuses: Left sphenoid sinusitis. Mild mucosal thickening within the ethmoidal air cells and maxillary sinuses. Mastoid air cells are clear. Orbits: Right gaze noted. Review of the MIP images confirms the above findings CTA NECK FINDINGS Aortic arch: Visualized aortic arch normal in caliber with normal branch pattern. Moderate atheromatous change about the arch and origin of the great vessels without significant stenosis. Appearance is stable. Right carotid system: Right  common and internal carotid arteries are tortuous. Eccentric calcified plaque at the origin of the cervical right ICA with less than 50% stenosis by NASCET criteria, stable. No dissection or other acute finding. Left carotid system: Left common and internal carotid arteries are tortuous. Vascular stent spans the left carotid bifurcation. Stable stent patency. No dissection or other acute finding. Vertebral arteries: Both vertebral arteries arise from the subclavian arteries. No progressive proximal subclavian artery stenosis. Vertebral arteries remain patent without stenosis or dissection. Skeleton: No discrete or worrisome osseous lesions. Other neck: No other acute soft tissue abnormality within the neck. Upper chest: Unremarkable. Review of the MIP images confirms the above findings CTA HEAD FINDINGS Anterior circulation: Atheromatous change within the carotid siphons without significant or progressive stenosis. A1 segments and anterior communicating artery complex remain patent without abnormality. Atheromatous change throughout the ACAs without high-grade stenosis, stable. No new or progressive M1 stenosis. Severe atheromatous disease involving the bilateral MCA branches without proximal occlusion, relatively stable from prior. Posterior circulation: Atheromatous irregularity throughout the V4 segments without high-grade stenosis. Both PICA remain patent. Irregularity throughout the basilar without progressive stenosis. Superior cerebellar arteries remain patent at their origins. Both PCAs primarily supplied via the basilar. Advanced atheromatous disease involving the PCAs with associated severe bilateral P2 stenoses, stable. PCAs remain patent to their distal aspects. Venous sinuses: Patent allowing for timing the contrast bolus. Anatomic variants: None significant.  No aneurysm. Review of the MIP  images confirms the above findings IMPRESSION: 1. Stable CTA of the head and neck, with no evidence for large  vessel occlusion or other emergent finding. 2. Advanced intracranial atherosclerotic disease, most pronounced at the bilateral into and P2 segments. 3. Stable patent left carotid stent. Stable less than 50% stenosis at the origin of the cervical right ICA. Electronically Signed   By: Jeannine Boga M.D.   On: 09/16/2021 22:36   CT ANGIO NECK W OR WO CONTRAST  Result Date: 09/16/2021 CLINICAL DATA:  Follow-up examination for stroke. EXAM: CT ANGIOGRAPHY HEAD AND NECK TECHNIQUE: Multidetector CT imaging of the head and neck was performed using the standard protocol during bolus administration of intravenous contrast. Multiplanar CT image reconstructions and MIPs were obtained to evaluate the vascular anatomy. Carotid stenosis measurements (when applicable) are obtained utilizing NASCET criteria, using the distal internal carotid diameter as the denominator. RADIATION DOSE REDUCTION: This exam was performed according to the departmental dose-optimization program which includes automated exposure control, adjustment of the mA and/or kV according to patient size and/or use of iterative reconstruction technique. CONTRAST:  60mL OMNIPAQUE IOHEXOL 350 MG/ML SOLN COMPARISON:  MRI from earlier the same day as well is multiple previous CTAs. FINDINGS: CT HEAD FINDINGS Brain: Scattered small volume acute to subacute ischemic infarcts better evaluated on prior MRI performed immediately prior on the same day. No intracranial hemorrhage. Chronic left posterior MCA distribution infarct. Underlying severe chronic microvascular ischemic disease. Right middle cranial fossa arachnoid cyst. No other mass lesion or mass effect. Stable ventricular size without hydrocephalus. No extra-axial collection. Vascular: No hyperdense vessel. Skull: Soft tissue protuberance at the scalp vertex, indeterminate, but similar to previous (series 5, image 71). Calvarium intact. Sinuses: Left sphenoid sinusitis. Mild mucosal thickening within  the ethmoidal air cells and maxillary sinuses. Mastoid air cells are clear. Orbits: Right gaze noted. Review of the MIP images confirms the above findings CTA NECK FINDINGS Aortic arch: Visualized aortic arch normal in caliber with normal branch pattern. Moderate atheromatous change about the arch and origin of the great vessels without significant stenosis. Appearance is stable. Right carotid system: Right common and internal carotid arteries are tortuous. Eccentric calcified plaque at the origin of the cervical right ICA with less than 50% stenosis by NASCET criteria, stable. No dissection or other acute finding. Left carotid system: Left common and internal carotid arteries are tortuous. Vascular stent spans the left carotid bifurcation. Stable stent patency. No dissection or other acute finding. Vertebral arteries: Both vertebral arteries arise from the subclavian arteries. No progressive proximal subclavian artery stenosis. Vertebral arteries remain patent without stenosis or dissection. Skeleton: No discrete or worrisome osseous lesions. Other neck: No other acute soft tissue abnormality within the neck. Upper chest: Unremarkable. Review of the MIP images confirms the above findings CTA HEAD FINDINGS Anterior circulation: Atheromatous change within the carotid siphons without significant or progressive stenosis. A1 segments and anterior communicating artery complex remain patent without abnormality. Atheromatous change throughout the ACAs without high-grade stenosis, stable. No new or progressive M1 stenosis. Severe atheromatous disease involving the bilateral MCA branches without proximal occlusion, relatively stable from prior. Posterior circulation: Atheromatous irregularity throughout the V4 segments without high-grade stenosis. Both PICA remain patent. Irregularity throughout the basilar without progressive stenosis. Superior cerebellar arteries remain patent at their origins. Both PCAs primarily supplied  via the basilar. Advanced atheromatous disease involving the PCAs with associated severe bilateral P2 stenoses, stable. PCAs remain patent to their distal aspects. Venous sinuses: Patent allowing for timing the contrast  bolus. Anatomic variants: None significant.  No aneurysm. Review of the MIP images confirms the above findings IMPRESSION: 1. Stable CTA of the head and neck, with no evidence for large vessel occlusion or other emergent finding. 2. Advanced intracranial atherosclerotic disease, most pronounced at the bilateral into and P2 segments. 3. Stable patent left carotid stent. Stable less than 50% stenosis at the origin of the cervical right ICA. Electronically Signed   By: Jeannine Boga M.D.   On: 09/16/2021 22:36   MR BRAIN WO CONTRAST  Result Date: 09/16/2021 CLINICAL DATA:  Initial evaluation for neuro deficit, stroke suspected. EXAM: MRI HEAD WITHOUT CONTRAST TECHNIQUE: Multiplanar, multiecho pulse sequences of the brain and surrounding structures were obtained without intravenous contrast. COMPARISON:  Prior CT and MRI from 06/21/2021. FINDINGS: Brain: Examination mildly degraded by motion artifact. Age-related cerebral atrophy with advanced chronic microvascular ischemic disease. Remote posterior left MCA distribution infarct involving the left frontoparietal region. Scattered remote lacunar infarcts present about the hemispheric cerebral white matter and deep gray nuclei. Patchy areas of restricted diffusion involving the cortical and subcortical aspect of the right parieto-occipital region, consistent with acute ischemic infarcts, posterior right MCA and/or MCA/PCA watershed distribution (series 5, image 83). Largest area of infarction measures 1.8 cm at the periatrial white matter. No associated hemorrhage or mass effect. Additional mild patchy diffusion abnormality seen at the posterior left splenium and adjacent subcortical left occipital lobe (series 5, images 79, 72), consistent with  a few additional small acute to early subacute ischemic infarcts. No associated hemorrhage or mass effect at this location. Otherwise, no other evidence for new or interval infarction. No acute intracranial hemorrhage. Chronic blood products at the left anterior corpus callosum noted. Benign arachnoid cyst noted at the right middle cranial fossa. No other mass lesion, mass effect, or midline shift. No hydrocephalus or extra-axial fluid collection. Pituitary gland suprasellar region normal. Midline structures intact. Vascular: Major intracranial vascular flow voids are maintained. Skull and upper cervical spine: Craniocervical junction normal. Bone marrow signal intensity normal. A scalp soft tissue abnormality. Sinuses/Orbits: Right gaze noted. Moderate secretions noted within the left sphenoid sinus. Scattered mucosal thickening noted within the ethmoidal air cells. Mastoid air cells are clear. Other: None. IMPRESSION: 1. Patchy acute ischemic infarcts involving the right parieto-occipital region as above. No associated hemorrhage or mass effect. 2. Additional small volume acute to early subacute ischemic infarcts involving the posterior left splenium and adjacent subcortical left occipital lobe. 3. Underlying age-related cerebral atrophy with advanced chronic microvascular ischemic disease, with multiple additional remote infarcts as above. Electronically Signed   By: Jeannine Boga M.D.   On: 09/16/2021 19:03    PHYSICAL EXAM  Temp:  [98.4 F (36.9 C)] 98.4 F (36.9 C) (01/22 1322) Pulse Rate:  [79-125] 96 (01/22 1322) Resp:  [12-26] 17 (01/22 1322) BP: (127-183)/(65-107) 151/73 (01/22 1322) SpO2:  [90 %-100 %] 98 % (01/22 1322)  General - ill-appearing, fatigued caucasian female but in no acute distress.  Cardiovascular - Regular rhythm and rate.  Mental Status -  Level of arousal and orientation to time, place, and person were intact. Language including expression, naming, repetition,  comprehension was assessed and found intact. Attention span and concentration were normal. Recent and remote memory were intact. Fund of Knowledge was assessed and was intact.  Cranial Nerves II - XII - II - Left hemianopia. III, IV, VI - Extraocular movements intact. V - Facial sensation intact bilaterally. VII - Facial movement intact bilaterally. VIII - Hearing & vestibular  intact bilaterally. X - Palate elevates symmetrically. XI - Chin turning & shoulder shrug intact bilaterally. XII - Tongue protrusion intact.  Motor Strength - RUE and RLE drift and weakness (residual from previous strokes) compared to left   Motor Tone - Muscle tone was assessed at the neck and appendages and was normal.  Reflexes - The patients reflexes were symmetrical in all extremities and she had no pathological reflexes.  Sensory - Light touch, temperature/pinprick were assessed and were symmetrical.    Coordination - The patient had normal movements in the hands and feet with no ataxia or dysmetria.  Tremor was absent.  Gait and Station - deferred.  ASSESSMENT/PLAN Jade [name] is a 66 y.o. female with history of HTN, T2DM, CVD, multiple strokes since July 2022, HLD, L carotid stenosis s/p stent presenting with altered mental status and blurry vision.   Stroke:  Multiple small infarcts involving right MCA, left PCA and b/l MCA/PCA territories likely secondary to intracranial stenosis in the setting of severe anemia CTA head & neck Stable with no evidence of LVO or emergent findings, advanced intracranial atherosclerotic disease, L stable patent carotid stent, R ICA <50% stenosis MRI  Patchy acute ischemic infarcts involving the right parieto-occipital region, additional small volume acute to early subacute ischemic infarcts involving the posterior left splenium and adjacent subcortical left occipital lobe, underlying age-related cerebral atrophy with advanced chronic microvascular ischemic  disease 2D Echo pending LDL 57 HgbA1c 5.7 VTE prophylaxis - SCD    Diet   Diet Heart Room service appropriate? Yes; Fluid consistency: Thin   aspirin 81 mg daily and clopidogrel 75 mg daily prior to admission, now on No antithrombotic. Patient not on antithrombotic due to extremely low hemoglobin. When Hgb stabilized and improved, plan to start ASA alone Therapy recommendations:  pending Disposition:  pending  Hypertension Home meds:  norvasc Stable Permissive hypertension (OK if < 220/120) but gradually normalize in 5-7 days Long-term BP goal normotensive  Hyperlipidemia Home meds:  lipitor 80, resumed in hospital LDL 57, goal < 70 On lipitor 80 mg  Continue statin at discharge  Diabetes type II Controlled Home meds: none HgbA1c 5.7, goal < 7.0 CBGs No results for input(s): GLUCAP in the last 72 hours.  SSI  Other Stroke Risk Factors Advanced Age >/= 80  Former Cigarette smoker Hx stroke/TIA  Other Active Problems Acute anemia  Hospital day # 1  France Ravens, MD PGY1 Resident  To contact Stroke Continuity provider, please refer to [email or web address] After hours, contact General Neurology

## 2021-09-17 NOTE — Progress Notes (Signed)
OT Cancellation Note  Patient Details Name: Jade [name] MRN: [id number] DOB: 1956-07-23   Cancelled Treatment:    Reason Eval/Treat Not Completed: Medical issues which prohibited therapy. Hbg 5.3 - confirmed holding pt today with MD.   Eber Jones., OTR/L Acute Rehabilitation Services Pager [phone] Office [phone]   Jeani Hawking M 09/17/2021, 2:45 PM

## 2021-09-17 NOTE — ED Notes (Signed)
RN called son, [name] per pts request. Son states he does not want to go against mothers wishes and still continues to refuse blood products. Son states he fully understands stituation and does not have any questions at this time.

## 2021-09-17 NOTE — ED Notes (Signed)
Per Dr. Lebron Conners stated its okay to transfuse. This RN got consent.

## 2021-09-17 NOTE — ED Notes (Signed)
Pt refusing blood transfusion. Pt states, "I just don't understand why y'all feel like I need blood." Pt educated on the reasons blood transfusions are given, her current lab values with low hemoglobin, etc. Pt continues to not want blood transfusion. Secure chat sent to MD notifying of pt's decision. Blood product refusal form is filled out and at pt bedside.

## 2021-09-17 NOTE — Consult Note (Signed)
[facility]  [phone] Fax:([phone] [place]  Referral MD  Reason for Referral: Sudden onset anemia  Chief Complaint  Patient presents with   Blurred Vision    Jade [name], previous stroke    HPI:   This is a very pleasant 66 yr old female patient with PMH of type II DM, HTN, CVD, multiple strokes since July 2022, HLD, L carotid stenosis s/p stent admitted with AMS, blurred vision, left sided weakness.  She had imaging which showed multiple right ischemic infarcts in parieto occipital region, primary team is investigating.  She had some routine labs while she was in the ER which showed severe drop in her hemoglobin with no clear evidence of bleeding hence hematology was consulted for further investigation.    She is awake at the time of my visit, did not want bright lights on in the room however was able to give a reasonable history.  She mentioned that she was being treated for recent strokes and she has been taking aspirin Plavix and was also started on a muscle relaxant called Dantrium recently.  She was feeling quite well prior to this admission today.  She denies any dizziness, weakness, syncopal attacks or shortness of breath.  She denied any change in her bowel habits, hematochezia or melena.  No hematuria.   Her last colonoscopy in chart was back in 2007.  Rest of the pertinent 10 point ROS reviewed and negative.  Past Medical History:  Diagnosis Date   Arthritis    "back, knees, some in my left hip" (03/21/2018)   Chronic kidney disease    "was told I had 25% kidney function in early 2012" (03/21/2018)   COPD (chronic obstructive pulmonary disease) (HCC)    CVA (cerebral vascular accident) ([place]) 03/21/2018   "numb all over; head to toe" (03/21/2018)   Fibromyalgia    History of gout    Hyperlipidemia    Hypertension    Migraine    "used to get them monthly during menopause" (03/21/2018)    Neuromuscular disorder ([place])    OSA (obstructive sleep apnea)    "don't tolerate the machine" (03/21/2018)   Pneumonia ~ 2007   Type 2 diabetes, diet controlled ([place])   :   Past Surgical History:  Procedure Laterality Date   HERNIA REPAIR     LAPAROSCOPIC CHOLECYSTECTOMY  06/2007   "no UHR w/this" (03/21/2018)   TONSILLECTOMY     TRANSCAROTID ARTERY REVASCULARIZATION  Left 04/06/2021   Procedure: LEFT TRANSCAROTID ARTERY REVASCULARIZATION;  Surgeon: Cherre Robins, MD;  Location: [facility];  Service: Vascular;  Laterality: Left;   ULTRASOUND GUIDANCE FOR VASCULAR ACCESS Right 04/06/2021   Procedure: ULTRASOUND GUIDANCE FOR VASCULAR ACCESS;  Surgeon: Cherre Robins, MD;  Location: [facility];  Service: Vascular;  Laterality: Right;   UMBILICAL HERNIA REPAIR  2009  :   Current Facility-Administered Medications  Medication Dose Route Frequency Provider Last Rate Last Admin   0.9 %  sodium chloride infusion (Manually program via Guardrails IV Fluids)   Intravenous Once Alcario Drought, Jared M, DO       0.9 %  sodium chloride infusion (Manually program via Guardrails IV Fluids)   Intravenous Once Debbe Odea, MD   Held at 09/17/21 0851   acetaminophen (TYLENOL) tablet 650 mg  650 mg Oral Q4H PRN Etta Quill, DO       Or   acetaminophen (TYLENOL) 160 MG/5ML solution 650 mg  650 mg Per  Tube Q4H PRN Etta Quill, DO       Or   acetaminophen (TYLENOL) suppository 650 mg  650 mg Rectal Q4H PRN Etta Quill, DO       atorvastatin (LIPITOR) tablet 80 mg  80 mg Oral Daily Jennette Kettle M, DO   80 mg at 09/17/21 0954   LORazepam (ATIVAN) injection 0.5 mg  0.5 mg Intravenous PRN Daleen Bo, MD   0.5 mg at 09/16/21 1757   pantoprazole (PROTONIX) injection 40 mg  40 mg Intravenous Q12H Jennette Kettle M, DO   40 mg at 09/17/21 0150   tiZANidine (ZANAFLEX) tablet 2 mg  2 mg Oral QHS Jennette Kettle M, DO   2 mg at 09/16/21 2347   Current Outpatient Medications  Medication Sig Dispense Refill    acetaminophen (TYLENOL) 500 MG tablet Take 1,000 mg by mouth at bedtime.     amLODipine (NORVASC) 10 MG tablet Take 10 mg by mouth daily.     aspirin EC 81 MG tablet Take 81 mg by mouth daily. Swallow whole.     atorvastatin (LIPITOR) 80 MG tablet Take 80 mg by mouth daily.     B Complex-C (B-COMPLEX WITH VITAMIN C) tablet Take 1 tablet by mouth daily.     cholecalciferol (VITAMIN D3) 25 MCG (1000 UNIT) tablet Take 1,000 Units by mouth daily.     clopidogrel (PLAVIX) 75 MG tablet Start taking after Brilinta is complete - Take 1 tablet (75 mg total) by mouth every morning. (Patient taking differently: Take 75 mg by mouth daily.) 30 tablet 2   dantrolene (DANTRIUM) 50 MG capsule Take 1 capsule (50 mg total) by mouth at bedtime. 30 capsule 0   loperamide (IMODIUM) 2 MG capsule Take 2 capsules (4 mg total) by mouth 2 (two) times daily as needed for diarrhea or loose stools. 30 capsule 0   Multiple Vitamins-Minerals (MULTIVITAMIN ADULTS 50+ PO) Take 1 tablet by mouth daily.     Multiple Vitamins-Minerals (ZINC PO) Take 1 tablet by mouth daily.     tiZANidine (ZANAFLEX) 2 MG tablet Take 2 mg by mouth at bedtime.     atorvastatin (LIPITOR) 80 MG tablet Take 1 tablet (80 mg total) by mouth daily. 30 tablet 0      Allergies  Allergen Reactions   Baclofen Other (See Comments)    Pt said felt weaker and had slurred speech   Nickel Dermatitis and Rash  :   Family History  Problem Relation Age of Onset   Heart disease Father    Leukemia Father    Alcohol abuse Son    Other Mother    Cancer Neg Hx    Diabetes Neg Hx    Hearing loss Neg Hx    Hyperlipidemia Neg Hx    Hypertension Neg Hx    Kidney disease Neg Hx    Stroke Neg Hx      Social History   Socioeconomic History   Marital status: Married    Spouse name: Jose   Number of children: 1   Years of education: Not on file   Highest education level: Not on file  Occupational History   Occupation: Disability  Tobacco Use    Smoking status: Former    Packs/day: 1.25    Years: 16.00    Pack years: 20.00    Types: Cigarettes    Quit date: 08/28/1987    Years since quitting: 34.0   Smokeless tobacco: Never  Vaping Use   Vaping Use:  Never used  Substance and Sexual Activity   Alcohol use: Not Currently   Drug use: Not Currently    Types: Marijuana   Sexual activity: Not Currently    Birth control/protection: Post-menopausal  Other Topics Concern   Not on file  Social History Narrative   Not on file   Social Determinants of Health   Financial Resource Strain: Not on file  Food Insecurity: Not on file  Transportation Needs: Not on file  Physical Activity: Not on file  Stress: Not on file  Social Connections: Not on file  Intimate Partner Violence: Not on file  :  Pertinent items are noted in HPI.  Exam: Patient Vitals for the past 24 hrs:  BP Temp Pulse Resp SpO2  09/17/21 1112 (!) 155/84 -- 97 (!) 22 100 %  09/17/21 1030 (!) 154/66 -- 86 14 99 %  09/17/21 1000 (!) 143/71 -- 93 16 99 %  09/17/21 0900 (!) 143/84 -- 79 12 98 %  09/17/21 0830 135/86 -- 99 (!) 26 99 %  09/17/21 0700 (!) 150/78 -- 90 12 98 %  09/17/21 0600 (!) 153/77 -- 87 19 90 %  09/17/21 0530 130/78 -- 85 20 99 %  09/17/21 0500 130/88 -- 90 19 97 %  09/17/21 0400 (!) 153/73 -- 89 18 95 %  09/17/21 0330 139/73 -- 85 18 98 %  09/17/21 0300 133/84 -- 87 18 97 %  09/17/21 0200 140/74 -- 90 20 100 %  09/17/21 0100 (!) 141/76 -- (!) 107 18 98 %  09/17/21 0030 (!) 133/98 -- (!) 105 (!) 22 97 %  09/17/21 0000 131/74 -- (!) 104 19 98 %  09/16/21 2345 (!) 134/107 -- (!) 109 (!) 21 97 %  09/16/21 2230 140/80 -- (!) 108 17 99 %  09/16/21 2205 (!) 150/79 -- (!) 107 18 97 %  09/16/21 2100 (!) 168/76 -- (!) 114 (!) 22 100 %  09/16/21 2030 (!) 157/72 -- (!) 125 17 98 %  09/16/21 2000 (!) 145/77 -- (!) 115 (!) 21 97 %  09/16/21 1930 (!) 181/73 -- (!) 113 19 99 %  09/16/21 1905 (!) 183/99 -- (!) 108 15 97 %  09/16/21 1745 (!) 147/73 --  (!) 107 14 99 %  09/16/21 1736 133/72 -- (!) 102 18 98 %  09/16/21 1630 127/75 -- (!) 102 13 98 %  09/16/21 1615 (!) 138/105 -- 96 16 99 %  09/16/21 1600 (!) 142/70 -- 92 15 99 %  09/16/21 1545 (!) 146/73 -- 93 15 99 %  09/16/21 1530 (!) 145/72 -- 92 18 100 %  09/16/21 1515 (!) 141/74 -- 92 16 99 %  09/16/21 1500 133/70 -- 86 15 99 %  09/16/21 1445 (!) 143/68 -- 93 15 99 %  09/16/21 1430 (!) 151/66 -- 90 17 100 %  09/16/21 1415 (!) 144/70 -- 84 17 100 %  09/16/21 1400 131/75 -- 87 17 100 %  09/16/21 1345 (!) 150/65 -- 86 13 100 %  09/16/21 1330 (!) 168/56 98.6 F (37 C) (!) 105 17 100 %  09/16/21 1320 -- -- -- -- 97 %    Physical Exam Constitutional:      Comments: Very pale  HENT:     Head: Normocephalic and atraumatic.  Cardiovascular:     Rate and Rhythm: Regular rhythm. Tachycardia present.  Pulmonary:     Effort: Pulmonary effort is normal.     Breath sounds: Normal breath sounds.  Abdominal:  General: Abdomen is flat.  Musculoskeletal:        General: No swelling or tenderness.     Cervical back: No rigidity or tenderness.  Skin:    General: Skin is warm and dry.  Neurological:     Mental Status: She is alert and oriented to person, place, and time.     Comments: Left sided weakness.  Psychiatric:        Mood and Affect: Mood normal.      Lab Results  Component Value Date   WBC 7.7 09/17/2021   HGB 5.3 (LL) 09/17/2021   HCT 17.0 (L) 09/17/2021   PLT 459 (H) 09/17/2021   GLUCOSE 155 (H) 09/16/2021   CHOL 126 09/17/2021   TRIG 128 09/17/2021   HDL 43 09/17/2021   LDLDIRECT 164.7 08/24/2013   LDLCALC 57 09/17/2021   ALT 13 09/16/2021   AST 17 09/16/2021   NA 138 09/16/2021   K 3.5 09/16/2021   CL 107 09/16/2021   CREATININE 0.80 09/16/2021   BUN 21 09/16/2021   CO2 23 09/16/2021    CT ABDOMEN PELVIS W WO CONTRAST  Result Date: 09/16/2021 CLINICAL DATA:  Progressive anemia, retroperitoneal hemorrhage EXAM: CT ABDOMEN AND PELVIS WITHOUT AND  WITH CONTRAST TECHNIQUE: Multidetector CT imaging of the abdomen and pelvis was performed following the standard protocol before and following the bolus administration of intravenous contrast. RADIATION DOSE REDUCTION: This exam was performed according to the departmental dose-optimization program which includes automated exposure control, adjustment of the mA and/or kV according to patient size and/or use of iterative reconstruction technique. CONTRAST:  66mL OMNIPAQUE IOHEXOL 350 MG/ML SOLN, 15mL OMNIPAQUE IOHEXOL 300 MG/ML SOLN COMPARISON:  09/22/2019 FINDINGS: Lower chest: No acute abnormality. Hepatobiliary: Status post cholecystectomy. Stable dystrophic calcifications within the gallbladder fossa. No intra or extrahepatic biliary ductal dilation. Liver unremarkable. Pancreas: Unremarkable Spleen: Unremarkable Adrenals/Urinary Tract: Adrenal glands are unremarkable. Kidneys are normal, without renal calculi, focal lesion, or hydronephrosis. Bladder is unremarkable. Stomach/Bowel: Stomach is within normal limits. Appendix appears normal. No evidence of bowel wall thickening, distention, or inflammatory changes. Vascular/Lymphatic: Aortic atherosclerosis. No enlarged abdominal or pelvic lymph nodes. Reproductive: Uterus and bilateral adnexa are unremarkable. Other: No retroperitoneal mass or hemorrhage identified. No abdominal wall hernia. The rectum is unremarkable. Musculoskeletal: No acute bone abnormality. Remote anterior wedge compression fractures of T12 and L1 are identified with minimal retropulsion. Remote superior endplate fracture of L4 noted. No lytic or blastic bone lesion. IMPRESSION: No radiographic explanation for the patient's progressive anemia. No retroperitoneal hemorrhage identified. No acute intra-abdominal pathology identified. Aortic Atherosclerosis (ICD10-I70.0). Electronically Signed   By: Fidela Salisbury M.D.   On: 09/16/2021 23:51   CT ANGIO HEAD W OR WO CONTRAST  Result Date:  09/16/2021 CLINICAL DATA:  Follow-up examination for stroke. EXAM: CT ANGIOGRAPHY HEAD AND NECK TECHNIQUE: Multidetector CT imaging of the head and neck was performed using the standard protocol during bolus administration of intravenous contrast. Multiplanar CT image reconstructions and MIPs were obtained to evaluate the vascular anatomy. Carotid stenosis measurements (when applicable) are obtained utilizing NASCET criteria, using the distal internal carotid diameter as the denominator. RADIATION DOSE REDUCTION: This exam was performed according to the departmental dose-optimization program which includes automated exposure control, adjustment of the mA and/or kV according to patient size and/or use of iterative reconstruction technique. CONTRAST:  52mL OMNIPAQUE IOHEXOL 350 MG/ML SOLN COMPARISON:  MRI from earlier the same day as well is multiple previous CTAs. FINDINGS: CT HEAD FINDINGS Brain: Scattered small volume  acute to subacute ischemic infarcts better evaluated on prior MRI performed immediately prior on the same day. No intracranial hemorrhage. Chronic left posterior MCA distribution infarct. Underlying severe chronic microvascular ischemic disease. Right middle cranial fossa arachnoid cyst. No other mass lesion or mass effect. Stable ventricular size without hydrocephalus. No extra-axial collection. Vascular: No hyperdense vessel. Skull: Soft tissue protuberance at the scalp vertex, indeterminate, but similar to previous (series 5, image 71). Calvarium intact. Sinuses: Left sphenoid sinusitis. Mild mucosal thickening within the ethmoidal air cells and maxillary sinuses. Mastoid air cells are clear. Orbits: Right gaze noted. Review of the MIP images confirms the above findings CTA NECK FINDINGS Aortic arch: Visualized aortic arch normal in caliber with normal branch pattern. Moderate atheromatous change about the arch and origin of the great vessels without significant stenosis. Appearance is stable.  Right carotid system: Right common and internal carotid arteries are tortuous. Eccentric calcified plaque at the origin of the cervical right ICA with less than 50% stenosis by NASCET criteria, stable. No dissection or other acute finding. Left carotid system: Left common and internal carotid arteries are tortuous. Vascular stent spans the left carotid bifurcation. Stable stent patency. No dissection or other acute finding. Vertebral arteries: Both vertebral arteries arise from the subclavian arteries. No progressive proximal subclavian artery stenosis. Vertebral arteries remain patent without stenosis or dissection. Skeleton: No discrete or worrisome osseous lesions. Other neck: No other acute soft tissue abnormality within the neck. Upper chest: Unremarkable. Review of the MIP images confirms the above findings CTA HEAD FINDINGS Anterior circulation: Atheromatous change within the carotid siphons without significant or progressive stenosis. A1 segments and anterior communicating artery complex remain patent without abnormality. Atheromatous change throughout the ACAs without high-grade stenosis, stable. No new or progressive M1 stenosis. Severe atheromatous disease involving the bilateral MCA branches without proximal occlusion, relatively stable from prior. Posterior circulation: Atheromatous irregularity throughout the V4 segments without high-grade stenosis. Both PICA remain patent. Irregularity throughout the basilar without progressive stenosis. Superior cerebellar arteries remain patent at their origins. Both PCAs primarily supplied via the basilar. Advanced atheromatous disease involving the PCAs with associated severe bilateral P2 stenoses, stable. PCAs remain patent to their distal aspects. Venous sinuses: Patent allowing for timing the contrast bolus. Anatomic variants: None significant.  No aneurysm. Review of the MIP images confirms the above findings IMPRESSION: 1. Stable CTA of the head and neck,  with no evidence for large vessel occlusion or other emergent finding. 2. Advanced intracranial atherosclerotic disease, most pronounced at the bilateral into and P2 segments. 3. Stable patent left carotid stent. Stable less than 50% stenosis at the origin of the cervical right ICA. Electronically Signed   By: Jeannine Boga M.D.   On: 09/16/2021 22:36   CT ANGIO NECK W OR WO CONTRAST  Result Date: 09/16/2021 CLINICAL DATA:  Follow-up examination for stroke. EXAM: CT ANGIOGRAPHY HEAD AND NECK TECHNIQUE: Multidetector CT imaging of the head and neck was performed using the standard protocol during bolus administration of intravenous contrast. Multiplanar CT image reconstructions and MIPs were obtained to evaluate the vascular anatomy. Carotid stenosis measurements (when applicable) are obtained utilizing NASCET criteria, using the distal internal carotid diameter as the denominator. RADIATION DOSE REDUCTION: This exam was performed according to the departmental dose-optimization program which includes automated exposure control, adjustment of the mA and/or kV according to patient size and/or use of iterative reconstruction technique. CONTRAST:  34mL OMNIPAQUE IOHEXOL 350 MG/ML SOLN COMPARISON:  MRI from earlier the same day as well is  multiple previous CTAs. FINDINGS: CT HEAD FINDINGS Brain: Scattered small volume acute to subacute ischemic infarcts better evaluated on prior MRI performed immediately prior on the same day. No intracranial hemorrhage. Chronic left posterior MCA distribution infarct. Underlying severe chronic microvascular ischemic disease. Right middle cranial fossa arachnoid cyst. No other mass lesion or mass effect. Stable ventricular size without hydrocephalus. No extra-axial collection. Vascular: No hyperdense vessel. Skull: Soft tissue protuberance at the scalp vertex, indeterminate, but similar to previous (series 5, image 71). Calvarium intact. Sinuses: Left sphenoid sinusitis. Mild  mucosal thickening within the ethmoidal air cells and maxillary sinuses. Mastoid air cells are clear. Orbits: Right gaze noted. Review of the MIP images confirms the above findings CTA NECK FINDINGS Aortic arch: Visualized aortic arch normal in caliber with normal branch pattern. Moderate atheromatous change about the arch and origin of the great vessels without significant stenosis. Appearance is stable. Right carotid system: Right common and internal carotid arteries are tortuous. Eccentric calcified plaque at the origin of the cervical right ICA with less than 50% stenosis by NASCET criteria, stable. No dissection or other acute finding. Left carotid system: Left common and internal carotid arteries are tortuous. Vascular stent spans the left carotid bifurcation. Stable stent patency. No dissection or other acute finding. Vertebral arteries: Both vertebral arteries arise from the subclavian arteries. No progressive proximal subclavian artery stenosis. Vertebral arteries remain patent without stenosis or dissection. Skeleton: No discrete or worrisome osseous lesions. Other neck: No other acute soft tissue abnormality within the neck. Upper chest: Unremarkable. Review of the MIP images confirms the above findings CTA HEAD FINDINGS Anterior circulation: Atheromatous change within the carotid siphons without significant or progressive stenosis. A1 segments and anterior communicating artery complex remain patent without abnormality. Atheromatous change throughout the ACAs without high-grade stenosis, stable. No new or progressive M1 stenosis. Severe atheromatous disease involving the bilateral MCA branches without proximal occlusion, relatively stable from prior. Posterior circulation: Atheromatous irregularity throughout the V4 segments without high-grade stenosis. Both PICA remain patent. Irregularity throughout the basilar without progressive stenosis. Superior cerebellar arteries remain patent at their origins.  Both PCAs primarily supplied via the basilar. Advanced atheromatous disease involving the PCAs with associated severe bilateral P2 stenoses, stable. PCAs remain patent to their distal aspects. Venous sinuses: Patent allowing for timing the contrast bolus. Anatomic variants: None significant.  No aneurysm. Review of the MIP images confirms the above findings IMPRESSION: 1. Stable CTA of the head and neck, with no evidence for large vessel occlusion or other emergent finding. 2. Advanced intracranial atherosclerotic disease, most pronounced at the bilateral into and P2 segments. 3. Stable patent left carotid stent. Stable less than 50% stenosis at the origin of the cervical right ICA. Electronically Signed   By: Jeannine Boga M.D.   On: 09/16/2021 22:36   MR BRAIN WO CONTRAST  Result Date: 09/16/2021 CLINICAL DATA:  Initial evaluation for neuro deficit, stroke suspected. EXAM: MRI HEAD WITHOUT CONTRAST TECHNIQUE: Multiplanar, multiecho pulse sequences of the brain and surrounding structures were obtained without intravenous contrast. COMPARISON:  Prior CT and MRI from 06/21/2021. FINDINGS: Brain: Examination mildly degraded by motion artifact. Age-related cerebral atrophy with advanced chronic microvascular ischemic disease. Remote posterior left MCA distribution infarct involving the left frontoparietal region. Scattered remote lacunar infarcts present about the hemispheric cerebral white matter and deep gray nuclei. Patchy areas of restricted diffusion involving the cortical and subcortical aspect of the right parieto-occipital region, consistent with acute ischemic infarcts, posterior right MCA and/or MCA/PCA watershed distribution (series  5, image 83). Largest area of infarction measures 1.8 cm at the periatrial white matter. No associated hemorrhage or mass effect. Additional mild patchy diffusion abnormality seen at the posterior left splenium and adjacent subcortical left occipital lobe (series 5,  images 79, 72), consistent with a few additional small acute to early subacute ischemic infarcts. No associated hemorrhage or mass effect at this location. Otherwise, no other evidence for new or interval infarction. No acute intracranial hemorrhage. Chronic blood products at the left anterior corpus callosum noted. Benign arachnoid cyst noted at the right middle cranial fossa. No other mass lesion, mass effect, or midline shift. No hydrocephalus or extra-axial fluid collection. Pituitary gland suprasellar region normal. Midline structures intact. Vascular: Major intracranial vascular flow voids are maintained. Skull and upper cervical spine: Craniocervical junction normal. Bone marrow signal intensity normal. A scalp soft tissue abnormality. Sinuses/Orbits: Right gaze noted. Moderate secretions noted within the left sphenoid sinus. Scattered mucosal thickening noted within the ethmoidal air cells. Mastoid air cells are clear. Other: None. IMPRESSION: 1. Patchy acute ischemic infarcts involving the right parieto-occipital region as above. No associated hemorrhage or mass effect. 2. Additional small volume acute to early subacute ischemic infarcts involving the posterior left splenium and adjacent subcortical left occipital lobe. 3. Underlying age-related cerebral atrophy with advanced chronic microvascular ischemic disease, with multiple additional remote infarcts as above. Electronically Signed   By: Jeannine Boga M.D.   On: 09/16/2021 19:03   DG Chest Portable 1 View  Result Date: 08/24/2021 CLINICAL DATA:  Diffuse chest wall pain. EXAM: PORTABLE CHEST 1 VIEW COMPARISON:  Chest x-ray dated April 02, 2021. FINDINGS: The heart size and mediastinal contours are within normal limits. Both lungs are clear. The visualized skeletal structures are unremarkable. IMPRESSION: No active disease. Electronically Signed   By: Titus Dubin M.D.   On: 08/24/2021 12:15     CT ABDOMEN PELVIS W WO CONTRAST  Result  Date: 09/16/2021 CLINICAL DATA:  Progressive anemia, retroperitoneal hemorrhage EXAM: CT ABDOMEN AND PELVIS WITHOUT AND WITH CONTRAST TECHNIQUE: Multidetector CT imaging of the abdomen and pelvis was performed following the standard protocol before and following the bolus administration of intravenous contrast. RADIATION DOSE REDUCTION: This exam was performed according to the departmental dose-optimization program which includes automated exposure control, adjustment of the mA and/or kV according to patient size and/or use of iterative reconstruction technique. CONTRAST:  65mL OMNIPAQUE IOHEXOL 350 MG/ML SOLN, 177mL OMNIPAQUE IOHEXOL 300 MG/ML SOLN COMPARISON:  09/22/2019 FINDINGS: Lower chest: No acute abnormality. Hepatobiliary: Status post cholecystectomy. Stable dystrophic calcifications within the gallbladder fossa. No intra or extrahepatic biliary ductal dilation. Liver unremarkable. Pancreas: Unremarkable Spleen: Unremarkable Adrenals/Urinary Tract: Adrenal glands are unremarkable. Kidneys are normal, without renal calculi, focal lesion, or hydronephrosis. Bladder is unremarkable. Stomach/Bowel: Stomach is within normal limits. Appendix appears normal. No evidence of bowel wall thickening, distention, or inflammatory changes. Vascular/Lymphatic: Aortic atherosclerosis. No enlarged abdominal or pelvic lymph nodes. Reproductive: Uterus and bilateral adnexa are unremarkable. Other: No retroperitoneal mass or hemorrhage identified. No abdominal wall hernia. The rectum is unremarkable. Musculoskeletal: No acute bone abnormality. Remote anterior wedge compression fractures of T12 and L1 are identified with minimal retropulsion. Remote superior endplate fracture of L4 noted. No lytic or blastic bone lesion. IMPRESSION: No radiographic explanation for the patient's progressive anemia. No retroperitoneal hemorrhage identified. No acute intra-abdominal pathology identified. Aortic Atherosclerosis (ICD10-I70.0).  Electronically Signed   By: Fidela Salisbury M.D.   On: 09/16/2021 23:51   CT ANGIO HEAD W OR WO CONTRAST  Result Date: 09/16/2021 CLINICAL DATA:  Follow-up examination for stroke. EXAM: CT ANGIOGRAPHY HEAD AND NECK TECHNIQUE: Multidetector CT imaging of the head and neck was performed using the standard protocol during bolus administration of intravenous contrast. Multiplanar CT image reconstructions and MIPs were obtained to evaluate the vascular anatomy. Carotid stenosis measurements (when applicable) are obtained utilizing NASCET criteria, using the distal internal carotid diameter as the denominator. RADIATION DOSE REDUCTION: This exam was performed according to the departmental dose-optimization program which includes automated exposure control, adjustment of the mA and/or kV according to patient size and/or use of iterative reconstruction technique. CONTRAST:  31mL OMNIPAQUE IOHEXOL 350 MG/ML SOLN COMPARISON:  MRI from earlier the same day as well is multiple previous CTAs. FINDINGS: CT HEAD FINDINGS Brain: Scattered small volume acute to subacute ischemic infarcts better evaluated on prior MRI performed immediately prior on the same day. No intracranial hemorrhage. Chronic left posterior MCA distribution infarct. Underlying severe chronic microvascular ischemic disease. Right middle cranial fossa arachnoid cyst. No other mass lesion or mass effect. Stable ventricular size without hydrocephalus. No extra-axial collection. Vascular: No hyperdense vessel. Skull: Soft tissue protuberance at the scalp vertex, indeterminate, but similar to previous (series 5, image 71). Calvarium intact. Sinuses: Left sphenoid sinusitis. Mild mucosal thickening within the ethmoidal air cells and maxillary sinuses. Mastoid air cells are clear. Orbits: Right gaze noted. Review of the MIP images confirms the above findings CTA NECK FINDINGS Aortic arch: Visualized aortic arch normal in caliber with normal branch pattern. Moderate  atheromatous change about the arch and origin of the great vessels without significant stenosis. Appearance is stable. Right carotid system: Right common and internal carotid arteries are tortuous. Eccentric calcified plaque at the origin of the cervical right ICA with less than 50% stenosis by NASCET criteria, stable. No dissection or other acute finding. Left carotid system: Left common and internal carotid arteries are tortuous. Vascular stent spans the left carotid bifurcation. Stable stent patency. No dissection or other acute finding. Vertebral arteries: Both vertebral arteries arise from the subclavian arteries. No progressive proximal subclavian artery stenosis. Vertebral arteries remain patent without stenosis or dissection. Skeleton: No discrete or worrisome osseous lesions. Other neck: No other acute soft tissue abnormality within the neck. Upper chest: Unremarkable. Review of the MIP images confirms the above findings CTA HEAD FINDINGS Anterior circulation: Atheromatous change within the carotid siphons without significant or progressive stenosis. A1 segments and anterior communicating artery complex remain patent without abnormality. Atheromatous change throughout the ACAs without high-grade stenosis, stable. No new or progressive M1 stenosis. Severe atheromatous disease involving the bilateral MCA branches without proximal occlusion, relatively stable from prior. Posterior circulation: Atheromatous irregularity throughout the V4 segments without high-grade stenosis. Both PICA remain patent. Irregularity throughout the basilar without progressive stenosis. Superior cerebellar arteries remain patent at their origins. Both PCAs primarily supplied via the basilar. Advanced atheromatous disease involving the PCAs with associated severe bilateral P2 stenoses, stable. PCAs remain patent to their distal aspects. Venous sinuses: Patent allowing for timing the contrast bolus. Anatomic variants: None significant.   No aneurysm. Review of the MIP images confirms the above findings IMPRESSION: 1. Stable CTA of the head and neck, with no evidence for large vessel occlusion or other emergent finding. 2. Advanced intracranial atherosclerotic disease, most pronounced at the bilateral into and P2 segments. 3. Stable patent left carotid stent. Stable less than 50% stenosis at the origin of the cervical right ICA. Electronically Signed   By: Jeannine Boga M.D.   On: 09/16/2021  22:36   CT ANGIO NECK W OR WO CONTRAST  Result Date: 09/16/2021 CLINICAL DATA:  Follow-up examination for stroke. EXAM: CT ANGIOGRAPHY HEAD AND NECK TECHNIQUE: Multidetector CT imaging of the head and neck was performed using the standard protocol during bolus administration of intravenous contrast. Multiplanar CT image reconstructions and MIPs were obtained to evaluate the vascular anatomy. Carotid stenosis measurements (when applicable) are obtained utilizing NASCET criteria, using the distal internal carotid diameter as the denominator. RADIATION DOSE REDUCTION: This exam was performed according to the departmental dose-optimization program which includes automated exposure control, adjustment of the mA and/or kV according to patient size and/or use of iterative reconstruction technique. CONTRAST:  41mL OMNIPAQUE IOHEXOL 350 MG/ML SOLN COMPARISON:  MRI from earlier the same day as well is multiple previous CTAs. FINDINGS: CT HEAD FINDINGS Brain: Scattered small volume acute to subacute ischemic infarcts better evaluated on prior MRI performed immediately prior on the same day. No intracranial hemorrhage. Chronic left posterior MCA distribution infarct. Underlying severe chronic microvascular ischemic disease. Right middle cranial fossa arachnoid cyst. No other mass lesion or mass effect. Stable ventricular size without hydrocephalus. No extra-axial collection. Vascular: No hyperdense vessel. Skull: Soft tissue protuberance at the scalp vertex,  indeterminate, but similar to previous (series 5, image 71). Calvarium intact. Sinuses: Left sphenoid sinusitis. Mild mucosal thickening within the ethmoidal air cells and maxillary sinuses. Mastoid air cells are clear. Orbits: Right gaze noted. Review of the MIP images confirms the above findings CTA NECK FINDINGS Aortic arch: Visualized aortic arch normal in caliber with normal branch pattern. Moderate atheromatous change about the arch and origin of the great vessels without significant stenosis. Appearance is stable. Right carotid system: Right common and internal carotid arteries are tortuous. Eccentric calcified plaque at the origin of the cervical right ICA with less than 50% stenosis by NASCET criteria, stable. No dissection or other acute finding. Left carotid system: Left common and internal carotid arteries are tortuous. Vascular stent spans the left carotid bifurcation. Stable stent patency. No dissection or other acute finding. Vertebral arteries: Both vertebral arteries arise from the subclavian arteries. No progressive proximal subclavian artery stenosis. Vertebral arteries remain patent without stenosis or dissection. Skeleton: No discrete or worrisome osseous lesions. Other neck: No other acute soft tissue abnormality within the neck. Upper chest: Unremarkable. Review of the MIP images confirms the above findings CTA HEAD FINDINGS Anterior circulation: Atheromatous change within the carotid siphons without significant or progressive stenosis. A1 segments and anterior communicating artery complex remain patent without abnormality. Atheromatous change throughout the ACAs without high-grade stenosis, stable. No new or progressive M1 stenosis. Severe atheromatous disease involving the bilateral MCA branches without proximal occlusion, relatively stable from prior. Posterior circulation: Atheromatous irregularity throughout the V4 segments without high-grade stenosis. Both PICA remain patent.  Irregularity throughout the basilar without progressive stenosis. Superior cerebellar arteries remain patent at their origins. Both PCAs primarily supplied via the basilar. Advanced atheromatous disease involving the PCAs with associated severe bilateral P2 stenoses, stable. PCAs remain patent to their distal aspects. Venous sinuses: Patent allowing for timing the contrast bolus. Anatomic variants: None significant.  No aneurysm. Review of the MIP images confirms the above findings IMPRESSION: 1. Stable CTA of the head and neck, with no evidence for large vessel occlusion or other emergent finding. 2. Advanced intracranial atherosclerotic disease, most pronounced at the bilateral into and P2 segments. 3. Stable patent left carotid stent. Stable less than 50% stenosis at the origin of the cervical right ICA. Electronically Signed  By: Jeannine Boga M.D.   On: 09/16/2021 22:36   MR BRAIN WO CONTRAST  Result Date: 09/16/2021 CLINICAL DATA:  Initial evaluation for neuro deficit, stroke suspected. EXAM: MRI HEAD WITHOUT CONTRAST TECHNIQUE: Multiplanar, multiecho pulse sequences of the brain and surrounding structures were obtained without intravenous contrast. COMPARISON:  Prior CT and MRI from 06/21/2021. FINDINGS: Brain: Examination mildly degraded by motion artifact. Age-related cerebral atrophy with advanced chronic microvascular ischemic disease. Remote posterior left MCA distribution infarct involving the left frontoparietal region. Scattered remote lacunar infarcts present about the hemispheric cerebral white matter and deep gray nuclei. Patchy areas of restricted diffusion involving the cortical and subcortical aspect of the right parieto-occipital region, consistent with acute ischemic infarcts, posterior right MCA and/or MCA/PCA watershed distribution (series 5, image 83). Largest area of infarction measures 1.8 cm at the periatrial white matter. No associated hemorrhage or mass effect. Additional  mild patchy diffusion abnormality seen at the posterior left splenium and adjacent subcortical left occipital lobe (series 5, images 79, 72), consistent with a few additional small acute to early subacute ischemic infarcts. No associated hemorrhage or mass effect at this location. Otherwise, no other evidence for new or interval infarction. No acute intracranial hemorrhage. Chronic blood products at the left anterior corpus callosum noted. Benign arachnoid cyst noted at the right middle cranial fossa. No other mass lesion, mass effect, or midline shift. No hydrocephalus or extra-axial fluid collection. Pituitary gland suprasellar region normal. Midline structures intact. Vascular: Major intracranial vascular flow voids are maintained. Skull and upper cervical spine: Craniocervical junction normal. Bone marrow signal intensity normal. A scalp soft tissue abnormality. Sinuses/Orbits: Right gaze noted. Moderate secretions noted within the left sphenoid sinus. Scattered mucosal thickening noted within the ethmoidal air cells. Mastoid air cells are clear. Other: None. IMPRESSION: 1. Patchy acute ischemic infarcts involving the right parieto-occipital region as above. No associated hemorrhage or mass effect. 2. Additional small volume acute to early subacute ischemic infarcts involving the posterior left splenium and adjacent subcortical left occipital lobe. 3. Underlying age-related cerebral atrophy with advanced chronic microvascular ischemic disease, with multiple additional remote infarcts as above. Electronically Signed   By: Jeannine Boga M.D.   On: 09/16/2021 19:03   DG Chest Portable 1 View  Result Date: 08/24/2021 CLINICAL DATA:  Diffuse chest wall pain. EXAM: PORTABLE CHEST 1 VIEW COMPARISON:  Chest x-ray dated April 02, 2021. FINDINGS: The heart size and mediastinal contours are within normal limits. Both lungs are clear. The visualized skeletal structures are unremarkable. IMPRESSION: No active  disease. Electronically Signed   By: Titus Dubin M.D.   On: 08/24/2021 12:15    Assessment and Plan:   This is a 66 year old female patient with past medical history significant for diabetes, hypertension, dyslipidemia, cerebrovascular disease who was admitted with altered mental status, blurred vision, left-sided weakness, was found to have multiple right acute ischemic infarcts in the parieto-occipital region likely secondary to embolic source.  She was also found to have severe anemia on admission which was started and.  Her labs back in mid December entirely normal.  She denies any bleeding per rectum or in her urine.  She denies any symptoms prior to her hospitalization.  She has been recently started on aspirin, Plavix and Dantrium.  She remembers starting this medication called Dantrium in mid December.  At this time there is no clear source of bleeding.  I agree with stool occult exam.  There is no evidence of hemolysis.    I wonder if  there is acute bone marrow suppression from external source such as medications are drug-induced aplastic anemia.  I would recommend holding the Dantrium for now since this has been reported to cause some anemia and aplastic anemia in the past. Have also added some additional labs including hepatitis panel HIV, reticulocyte count, ferritin, [id number] and folic acid. I agree with transfusion to maintain hemoglobin of 8 g/dL since acute stroke. If she continues to have worsening hemoglobin despite transfusion, we will consider bone marrow aspiration and biopsy inpatient.  We will continue to follow her inpatient. Thank you for consulting [place] in the care of this patient.  Please do not hesitate to contact us with any additional questions or concerns.  The length of time of the face-to-face encounter was 70 minutes. More than 50% of time was spent counseling and coordination of care.   Thank you for this referral.

## 2021-09-17 NOTE — ED Notes (Signed)
Admitting MD at bedside to discuss blood transfusion w/ pt and her son, pt is refusing blood products.

## 2021-09-17 NOTE — ED Notes (Signed)
Pt placed on bedpan

## 2021-09-17 NOTE — ED Notes (Signed)
Pt cleaned up of urinary incontinence. Fresh linens placed on bed, clean brief on pt. Pt tolerated well.

## 2021-09-17 NOTE — ED Notes (Signed)
RN talked to pt about care and explain how critically low her hemoglobin is. Pt is still refusing blood at this time.

## 2021-09-17 NOTE — Progress Notes (Signed)
PT Cancellation Note  Patient Details Name: Jade [name] MRN: [id number] DOB: 1956/07/20   Cancelled Treatment:    Reason Eval/Treat Not Completed: Patient not medically ready. Pt with low hemoglobin levels, planning to get a transfusion. Confirmed with MD to hold PT session today. Will plan to follow-up tomorrow as able.   Moishe Spice, PT, DPT Acute Rehabilitation Services  Pager: [phone] Office: [place] 09/17/2021, 2:41 PM

## 2021-09-17 NOTE — Progress Notes (Addendum)
PROGRESS NOTE    Jade [name]   [id number]  DOB: 21-Dec-1955  DOA: 09/16/2021 PCP: Sue Lush, PA-C   Brief Narrative:  Jade [name] is a 66 year old female with a history of CVA, left carotid stenosis status post stent, hypertension, diabetes melitis presents to the ED with blurred vision for a few days. MRI revealed multifocal posterior circulation infarcts felt to be embolic per neurology Also noted to have a hemoglobin around 7 which is a major drop from the 12-15 range about 1 month ago.  He declined a blood transfusion.   Subjective:  Evaluated in the ED.  She feels that the vision changes are not better. When asked about ambulation, she admits that she has been off balance lately.    Assessment & Plan:   Principal Problem:   Acute arterial ischemic stroke, multifocal, posterior circulation, unspecified laterality (HCC) -In addition to visual disturbances, she tells me that she is had difficulty ambulating at home and has been off balance -Possibly watershed infarcts due to significant drop in hemoglobin in the setting of severe stenosis - was discharged home on aspirin and Plavix on 06/30/2021 with a plan to continue both for 2 months and then do Plavix alone -At this time both are on hold  Active Problems:  Acute anemia, normocytic - She is not aware of any bleeding no signs of bleeding in the hospital - Hemoglobin has dropped from yesterday to 5.3 today--not dilutional as she did not receive any IV fluids -Both she and her son are declining blood transfusions - I have asked for a hematology opinion who has recommended to hold Dantrolene and is doing further work up - transfer to progressive care -Addendum: I called and spoke with the son -  they have opted to take blood transfusions-  HTN - holding antihypertensives in setting of anemia    Diabetes mellitus with hyperglycemia - not on medications at home Hemoglobin A1C    Component Value Date/Time    HGBA1C 5.7 (H) 06/18/2021 0231   Wheelchair-bound - Mostly wheelchair-bound- lives with her husband-son lives nearby   DVT prophylaxis: SCDs Code Status: DNR Family Communication: son Level of Care: Level of care: Telemetry Medical Disposition Plan:  Status is: Inpatient  Remains inpatient appropriate because: acute anemia, CVAs      Consultants:  Neurology Hematology Procedures:   Antimicrobials:  Anti-infectives (From admission, onward)    None        Objective: Vitals:   09/17/21 0900 09/17/21 1000 09/17/21 1030 09/17/21 1112  BP: (!) 143/84 (!) 143/71 (!) 154/66 (!) 155/84  Pulse: 79 93 86 97  Resp: 12 16 14  (!) 22  Temp:      SpO2: 98% 99% 99% 100%   No intake or output data in the 24 hours ending 09/17/21 1135 There were no vitals filed for this visit.  Examination: General exam: Appears comfortable  HEENT: oral mucosa moist, no sclera icterus or thrush Respiratory system: Clear to auscultation. Respiratory effort normal. Cardiovascular system: S1 & S2 heard, RRR.   Gastrointestinal system: Abdomen soft, non-tender, nondistended. Normal bowel sounds. Central nervous system: Alert and oriented x3.  Unable to move right arm- Extremities: No cyanosis, clubbing or edema Skin: No rashes or ulcers Psychiatry: Flat affect    Data Reviewed: I have personally reviewed following labs and imaging studies  CBC: Recent Labs  Lab 09/16/21 1423 09/16/21 2211 09/17/21 0426  WBC 6.6  --  7.7  NEUTROABS 4.9  --   --  HGB 7.7* 5.9* 5.3*  HCT 24.6* 18.4* 17.0*  MCV 98.0  --  97.7  PLT 372  --  [id number]   Basic Metabolic Panel: Recent Labs  Lab 09/16/21 1423  NA 138  K 3.5  CL 107  CO2 23  GLUCOSE 155*  BUN 21  CREATININE 0.80  CALCIUM 8.7*   GFR: CrCl cannot be calculated (Unknown ideal weight.). Liver Function Tests: Recent Labs  Lab 09/16/21 1423  AST 17  ALT 13  ALKPHOS 57  BILITOT 0.3  PROT 5.5*  ALBUMIN 3.2*   No results for  input(s): LIPASE, AMYLASE in the last 168 hours. No results for input(s): AMMONIA in the last 168 hours. Coagulation Profile: Recent Labs  Lab 09/16/21 1423  INR 1.0   Cardiac Enzymes: No results for input(s): CKTOTAL, CKMB, CKMBINDEX, TROPONINI in the last 168 hours. BNP (last 3 results) No results for input(s): PROBNP in the last 8760 hours. HbA1C: No results for input(s): HGBA1C in the last 72 hours. CBG: No results for input(s): GLUCAP in the last 168 hours. Lipid Profile: Recent Labs    09/17/21 0426  CHOL 126  HDL 43  LDLCALC 57  TRIG 128  CHOLHDL 2.9   Thyroid Function Tests: No results for input(s): TSH, T4TOTAL, FREET4, T3FREE, THYROIDAB in the last 72 hours. Anemia Panel: No results for input(s): VITAMINB12, FOLATE, FERRITIN, TIBC, IRON, RETICCTPCT in the last 72 hours. Urine analysis:    Component Value Date/Time   COLORURINE YELLOW 09/16/2021 1423   APPEARANCEUR HAZY (A) 09/16/2021 1423   LABSPEC >1.046 (H) 09/16/2021 1423   PHURINE 5.0 09/16/2021 1423   GLUCOSEU NEGATIVE 09/16/2021 [place] 03/08/2015 0855   HGBUR NEGATIVE 09/16/2021 [place] 09/16/2021 1423   KETONESUR NEGATIVE 09/16/2021 1423   PROTEINUR NEGATIVE 09/16/2021 1423   UROBILINOGEN 0.2 03/08/2015 0855   NITRITE NEGATIVE 09/16/2021 1423   LEUKOCYTESUR MODERATE (A) 09/16/2021 1423   Sepsis Labs: @LABRCNTIP (procalcitonin:4,lacticidven:4) ) Recent Results (from the past 240 hour(s))  Resp Panel by RT-PCR (Flu A&B, Covid) Nasopharyngeal Swab     Status: None   Collection Time: 09/16/21  4:04 PM   Specimen: Nasopharyngeal Swab; Nasopharyngeal(NP) swabs in vial transport medium  Result Value Ref Range Status   SARS Coronavirus 2 by RT PCR NEGATIVE NEGATIVE Final    Comment: (NOTE) SARS-CoV-2 target nucleic acids are NOT DETECTED.  The SARS-CoV-2 RNA is generally detectable in upper respiratory specimens during the acute phase of infection. The  lowest concentration of SARS-CoV-2 viral copies this assay can detect is 138 copies/mL. A negative result does not preclude SARS-Cov-2 infection and should not be used as the sole basis for treatment or other patient management decisions. A negative result may occur with  improper specimen collection/handling, submission of specimen other than nasopharyngeal swab, presence of viral mutation(s) within the areas targeted by this assay, and inadequate number of viral copies(<138 copies/mL). A negative result must be combined with clinical observations, patient history, and epidemiological information. The expected result is Negative.  Fact Sheet for Patients:  EntrepreneurPulse.com.au  Fact Sheet for Healthcare Providers:  IncredibleEmployment.be  This test is no t yet approved or cleared by the [place] FDA and  has been authorized for detection and/or diagnosis of SARS-CoV-2 by FDA under an Emergency Use Authorization (EUA). This EUA will remain  in effect (meaning this test can be used) for the duration of the COVID-19 declaration under Section 564(b)(1) of the Act, 21 U.S.C.section 360bbb-3(b)(1), unless the authorization  is terminated  or revoked sooner.       Influenza A by PCR NEGATIVE NEGATIVE Final   Influenza B by PCR NEGATIVE NEGATIVE Final    Comment: (NOTE) The Xpert Xpress SARS-CoV-2/FLU/RSV plus assay is intended as an aid in the diagnosis of influenza from Nasopharyngeal swab specimens and should not be used as a sole basis for treatment. Nasal washings and aspirates are unacceptable for Xpert Xpress SARS-CoV-2/FLU/RSV testing.  Fact Sheet for Patients: EntrepreneurPulse.com.au  Fact Sheet for Healthcare Providers: IncredibleEmployment.be  This test is not yet approved or cleared by the [place] FDA and has been authorized for detection and/or diagnosis of SARS-CoV-2 by FDA under  an Emergency Use Authorization (EUA). This EUA will remain in effect (meaning this test can be used) for the duration of the COVID-19 declaration under Section 564(b)(1) of the Act, 21 U.S.C. section 360bbb-3(b)(1), unless the authorization is terminated or revoked.  Performed at [facility], [place] [address], [place], [place] [address]          Radiology Studies: CT ABDOMEN PELVIS W WO CONTRAST  Result Date: 09/16/2021 CLINICAL DATA:  Progressive anemia, retroperitoneal hemorrhage EXAM: CT ABDOMEN AND PELVIS WITHOUT AND WITH CONTRAST TECHNIQUE: Multidetector CT imaging of the abdomen and pelvis was performed following the standard protocol before and following the bolus administration of intravenous contrast. RADIATION DOSE REDUCTION: This exam was performed according to the departmental dose-optimization program which includes automated exposure control, adjustment of the mA and/or kV according to patient size and/or use of iterative reconstruction technique. CONTRAST:  73mL OMNIPAQUE IOHEXOL 350 MG/ML SOLN, 117mL OMNIPAQUE IOHEXOL 300 MG/ML SOLN COMPARISON:  09/22/2019 FINDINGS: Lower chest: No acute abnormality. Hepatobiliary: Status post cholecystectomy. Stable dystrophic calcifications within the gallbladder fossa. No intra or extrahepatic biliary ductal dilation. Liver unremarkable. Pancreas: Unremarkable Spleen: Unremarkable Adrenals/Urinary Tract: Adrenal glands are unremarkable. Kidneys are normal, without renal calculi, focal lesion, or hydronephrosis. Bladder is unremarkable. Stomach/Bowel: Stomach is within normal limits. Appendix appears normal. No evidence of bowel wall thickening, distention, or inflammatory changes. Vascular/Lymphatic: Aortic atherosclerosis. No enlarged abdominal or pelvic lymph nodes. Reproductive: Uterus and bilateral adnexa are unremarkable. Other: No retroperitoneal mass or hemorrhage identified. No abdominal wall hernia. The rectum is unremarkable.  Musculoskeletal: No acute bone abnormality. Remote anterior wedge compression fractures of T12 and L1 are identified with minimal retropulsion. Remote superior endplate fracture of L4 noted. No lytic or blastic bone lesion. IMPRESSION: No radiographic explanation for the patient's progressive anemia. No retroperitoneal hemorrhage identified. No acute intra-abdominal pathology identified. Aortic Atherosclerosis (ICD10-I70.0). Electronically Signed   By: Fidela Salisbury M.D.   On: 09/16/2021 23:51   CT ANGIO HEAD W OR WO CONTRAST  Result Date: 09/16/2021 CLINICAL DATA:  Follow-up examination for stroke. EXAM: CT ANGIOGRAPHY HEAD AND NECK TECHNIQUE: Multidetector CT imaging of the head and neck was performed using the standard protocol during bolus administration of intravenous contrast. Multiplanar CT image reconstructions and MIPs were obtained to evaluate the vascular anatomy. Carotid stenosis measurements (when applicable) are obtained utilizing NASCET criteria, using the distal internal carotid diameter as the denominator. RADIATION DOSE REDUCTION: This exam was performed according to the departmental dose-optimization program which includes automated exposure control, adjustment of the mA and/or kV according to patient size and/or use of iterative reconstruction technique. CONTRAST:  69mL OMNIPAQUE IOHEXOL 350 MG/ML SOLN COMPARISON:  MRI from earlier the same day as well is multiple previous CTAs. FINDINGS: CT HEAD FINDINGS Brain: Scattered small volume acute to subacute ischemic infarcts better  evaluated on prior MRI performed immediately prior on the same day. No intracranial hemorrhage. Chronic left posterior MCA distribution infarct. Underlying severe chronic microvascular ischemic disease. Right middle cranial fossa arachnoid cyst. No other mass lesion or mass effect. Stable ventricular size without hydrocephalus. No extra-axial collection. Vascular: No hyperdense vessel. Skull: Soft tissue protuberance  at the scalp vertex, indeterminate, but similar to previous (series 5, image 71). Calvarium intact. Sinuses: Left sphenoid sinusitis. Mild mucosal thickening within the ethmoidal air cells and maxillary sinuses. Mastoid air cells are clear. Orbits: Right gaze noted. Review of the MIP images confirms the above findings CTA NECK FINDINGS Aortic arch: Visualized aortic arch normal in caliber with normal branch pattern. Moderate atheromatous change about the arch and origin of the great vessels without significant stenosis. Appearance is stable. Right carotid system: Right common and internal carotid arteries are tortuous. Eccentric calcified plaque at the origin of the cervical right ICA with less than 50% stenosis by NASCET criteria, stable. No dissection or other acute finding. Left carotid system: Left common and internal carotid arteries are tortuous. Vascular stent spans the left carotid bifurcation. Stable stent patency. No dissection or other acute finding. Vertebral arteries: Both vertebral arteries arise from the subclavian arteries. No progressive proximal subclavian artery stenosis. Vertebral arteries remain patent without stenosis or dissection. Skeleton: No discrete or worrisome osseous lesions. Other neck: No other acute soft tissue abnormality within the neck. Upper chest: Unremarkable. Review of the MIP images confirms the above findings CTA HEAD FINDINGS Anterior circulation: Atheromatous change within the carotid siphons without significant or progressive stenosis. A1 segments and anterior communicating artery complex remain patent without abnormality. Atheromatous change throughout the ACAs without high-grade stenosis, stable. No new or progressive M1 stenosis. Severe atheromatous disease involving the bilateral MCA branches without proximal occlusion, relatively stable from prior. Posterior circulation: Atheromatous irregularity throughout the V4 segments without high-grade stenosis. Both PICA  remain patent. Irregularity throughout the basilar without progressive stenosis. Superior cerebellar arteries remain patent at their origins. Both PCAs primarily supplied via the basilar. Advanced atheromatous disease involving the PCAs with associated severe bilateral P2 stenoses, stable. PCAs remain patent to their distal aspects. Venous sinuses: Patent allowing for timing the contrast bolus. Anatomic variants: None significant.  No aneurysm. Review of the MIP images confirms the above findings IMPRESSION: 1. Stable CTA of the head and neck, with no evidence for large vessel occlusion or other emergent finding. 2. Advanced intracranial atherosclerotic disease, most pronounced at the bilateral into and P2 segments. 3. Stable patent left carotid stent. Stable less than 50% stenosis at the origin of the cervical right ICA. Electronically Signed   By: Jeannine Boga M.D.   On: 09/16/2021 22:36   CT ANGIO NECK W OR WO CONTRAST  Result Date: 09/16/2021 CLINICAL DATA:  Follow-up examination for stroke. EXAM: CT ANGIOGRAPHY HEAD AND NECK TECHNIQUE: Multidetector CT imaging of the head and neck was performed using the standard protocol during bolus administration of intravenous contrast. Multiplanar CT image reconstructions and MIPs were obtained to evaluate the vascular anatomy. Carotid stenosis measurements (when applicable) are obtained utilizing NASCET criteria, using the distal internal carotid diameter as the denominator. RADIATION DOSE REDUCTION: This exam was performed according to the departmental dose-optimization program which includes automated exposure control, adjustment of the mA and/or kV according to patient size and/or use of iterative reconstruction technique. CONTRAST:  35mL OMNIPAQUE IOHEXOL 350 MG/ML SOLN COMPARISON:  MRI from earlier the same day as well is multiple previous CTAs. FINDINGS: CT HEAD  FINDINGS Brain: Scattered small volume acute to subacute ischemic infarcts better evaluated  on prior MRI performed immediately prior on the same day. No intracranial hemorrhage. Chronic left posterior MCA distribution infarct. Underlying severe chronic microvascular ischemic disease. Right middle cranial fossa arachnoid cyst. No other mass lesion or mass effect. Stable ventricular size without hydrocephalus. No extra-axial collection. Vascular: No hyperdense vessel. Skull: Soft tissue protuberance at the scalp vertex, indeterminate, but similar to previous (series 5, image 71). Calvarium intact. Sinuses: Left sphenoid sinusitis. Mild mucosal thickening within the ethmoidal air cells and maxillary sinuses. Mastoid air cells are clear. Orbits: Right gaze noted. Review of the MIP images confirms the above findings CTA NECK FINDINGS Aortic arch: Visualized aortic arch normal in caliber with normal branch pattern. Moderate atheromatous change about the arch and origin of the great vessels without significant stenosis. Appearance is stable. Right carotid system: Right common and internal carotid arteries are tortuous. Eccentric calcified plaque at the origin of the cervical right ICA with less than 50% stenosis by NASCET criteria, stable. No dissection or other acute finding. Left carotid system: Left common and internal carotid arteries are tortuous. Vascular stent spans the left carotid bifurcation. Stable stent patency. No dissection or other acute finding. Vertebral arteries: Both vertebral arteries arise from the subclavian arteries. No progressive proximal subclavian artery stenosis. Vertebral arteries remain patent without stenosis or dissection. Skeleton: No discrete or worrisome osseous lesions. Other neck: No other acute soft tissue abnormality within the neck. Upper chest: Unremarkable. Review of the MIP images confirms the above findings CTA HEAD FINDINGS Anterior circulation: Atheromatous change within the carotid siphons without significant or progressive stenosis. A1 segments and anterior  communicating artery complex remain patent without abnormality. Atheromatous change throughout the ACAs without high-grade stenosis, stable. No new or progressive M1 stenosis. Severe atheromatous disease involving the bilateral MCA branches without proximal occlusion, relatively stable from prior. Posterior circulation: Atheromatous irregularity throughout the V4 segments without high-grade stenosis. Both PICA remain patent. Irregularity throughout the basilar without progressive stenosis. Superior cerebellar arteries remain patent at their origins. Both PCAs primarily supplied via the basilar. Advanced atheromatous disease involving the PCAs with associated severe bilateral P2 stenoses, stable. PCAs remain patent to their distal aspects. Venous sinuses: Patent allowing for timing the contrast bolus. Anatomic variants: None significant.  No aneurysm. Review of the MIP images confirms the above findings IMPRESSION: 1. Stable CTA of the head and neck, with no evidence for large vessel occlusion or other emergent finding. 2. Advanced intracranial atherosclerotic disease, most pronounced at the bilateral into and P2 segments. 3. Stable patent left carotid stent. Stable less than 50% stenosis at the origin of the cervical right ICA. Electronically Signed   By: Jeannine Boga M.D.   On: 09/16/2021 22:36   MR BRAIN WO CONTRAST  Result Date: 09/16/2021 CLINICAL DATA:  Initial evaluation for neuro deficit, stroke suspected. EXAM: MRI HEAD WITHOUT CONTRAST TECHNIQUE: Multiplanar, multiecho pulse sequences of the brain and surrounding structures were obtained without intravenous contrast. COMPARISON:  Prior CT and MRI from 06/21/2021. FINDINGS: Brain: Examination mildly degraded by motion artifact. Age-related cerebral atrophy with advanced chronic microvascular ischemic disease. Remote posterior left MCA distribution infarct involving the left frontoparietal region. Scattered remote lacunar infarcts present about  the hemispheric cerebral white matter and deep gray nuclei. Patchy areas of restricted diffusion involving the cortical and subcortical aspect of the right parieto-occipital region, consistent with acute ischemic infarcts, posterior right MCA and/or MCA/PCA watershed distribution (series 5, image 83). Largest area  of infarction measures 1.8 cm at the periatrial white matter. No associated hemorrhage or mass effect. Additional mild patchy diffusion abnormality seen at the posterior left splenium and adjacent subcortical left occipital lobe (series 5, images 79, 72), consistent with a few additional small acute to early subacute ischemic infarcts. No associated hemorrhage or mass effect at this location. Otherwise, no other evidence for new or interval infarction. No acute intracranial hemorrhage. Chronic blood products at the left anterior corpus callosum noted. Benign arachnoid cyst noted at the right middle cranial fossa. No other mass lesion, mass effect, or midline shift. No hydrocephalus or extra-axial fluid collection. Pituitary gland suprasellar region normal. Midline structures intact. Vascular: Major intracranial vascular flow voids are maintained. Skull and upper cervical spine: Craniocervical junction normal. Bone marrow signal intensity normal. A scalp soft tissue abnormality. Sinuses/Orbits: Right gaze noted. Moderate secretions noted within the left sphenoid sinus. Scattered mucosal thickening noted within the ethmoidal air cells. Mastoid air cells are clear. Other: None. IMPRESSION: 1. Patchy acute ischemic infarcts involving the right parieto-occipital region as above. No associated hemorrhage or mass effect. 2. Additional small volume acute to early subacute ischemic infarcts involving the posterior left splenium and adjacent subcortical left occipital lobe. 3. Underlying age-related cerebral atrophy with advanced chronic microvascular ischemic disease, with multiple additional remote infarcts as  above. Electronically Signed   By: Jeannine Boga M.D.   On: 09/16/2021 19:03      Scheduled Meds:  sodium chloride   Intravenous Once   sodium chloride   Intravenous Once   atorvastatin  80 mg Oral Daily   pantoprazole (PROTONIX) IV  40 mg Intravenous Q12H   tiZANidine  2 mg Oral QHS   Continuous Infusions:   LOS: 1 day      Debbe Odea, MD [facility] Pager: [email or web address] 09/17/2021, 11:35 AM

## 2021-09-17 NOTE — ED Notes (Signed)
Pt had small hard bowel movement. This RN cleaned pt.

## 2021-09-17 NOTE — ED Notes (Signed)
Son called per pt request. Per son, pt "has mind made up and will continue to refuse blood. She decided before her strokes that she wouldn't want a blood transfusion and nothing will change her mind." Pt seems dissatisfied with care, stating "Everything y'all do here is ass backwards. Nothing makes sense." Pt will not elaborate and refuses further explanation of her treatment plans and medications ordered.

## 2021-09-17 NOTE — ED Notes (Signed)
MD at bedside assessing pt at this time.

## 2021-09-18 ENCOUNTER — Inpatient Hospital Stay: Payer: Medicare Other | Admitting: Neurology

## 2021-09-18 DIAGNOSIS — I63539 Cerebral infarction due to unspecified occlusion or stenosis of unspecified posterior cerebral artery: Secondary | ICD-10-CM | POA: Diagnosis not present

## 2021-09-18 DIAGNOSIS — Z8673 Personal history of transient ischemic attack (TIA), and cerebral infarction without residual deficits: Secondary | ICD-10-CM

## 2021-09-18 LAB — TYPE AND SCREEN
ABO/RH(D): B NEG
Antibody Screen: NEGATIVE
Unit division: 0
Unit division: 0

## 2021-09-18 LAB — CBC
HCT: 30.7 % — ABNORMAL LOW (ref 36.0–46.0)
HCT: 26.6 % — ABNORMAL LOW (ref 36.0–46.0)
Hemoglobin: 10.6 g/dL — ABNORMAL LOW (ref 12.0–15.0)
Hemoglobin: 9.1 g/dL — ABNORMAL LOW (ref 12.0–15.0)
MCH: 31.3 pg (ref 26.0–34.0)
MCH: 31 pg (ref 26.0–34.0)
MCHC: 34.5 g/dL (ref 30.0–36.0)
MCHC: 34.2 g/dL (ref 30.0–36.0)
MCV: 90.6 fL (ref 80.0–100.0)
MCV: 90.5 fL (ref 80.0–100.0)
Platelets: 524 10*3/uL — ABNORMAL HIGH (ref 150–400)
Platelets: 467 10*3/uL — ABNORMAL HIGH (ref 150–400)
RBC: 3.39 MIL/uL — ABNORMAL LOW (ref 3.87–5.11)
RBC: 2.94 MIL/uL — ABNORMAL LOW (ref 3.87–5.11)
RDW: 14.7 % (ref 11.5–15.5)
RDW: 14.7 % (ref 11.5–15.5)
WBC: 10.2 10*3/uL (ref 4.0–10.5)
WBC: 11.9 10*3/uL — ABNORMAL HIGH (ref 4.0–10.5)
nRBC: 0 % (ref 0.0–0.2)
nRBC: 0 % (ref 0.0–0.2)

## 2021-09-18 LAB — HEPATITIS PANEL, ACUTE
HCV Ab: NONREACTIVE
Hep A IgM: NONREACTIVE
Hep B C IgM: NONREACTIVE
Hepatitis B Surface Ag: NONREACTIVE

## 2021-09-18 LAB — BPAM RBC
Blood Product Expiration Date: 202302012359
Blood Product Expiration Date: 202302032359
ISSUE DATE / TIME: 202301221311
ISSUE DATE / TIME: 202301221521
Unit Type and Rh: 1700
Unit Type and Rh: 1700

## 2021-09-18 LAB — BASIC METABOLIC PANEL
Anion gap: 8 (ref 5–15)
BUN: 10 mg/dL (ref 8–23)
CO2: 23 mmol/L (ref 22–32)
Calcium: 9.1 mg/dL (ref 8.9–10.3)
Chloride: 110 mmol/L (ref 98–111)
Creatinine, Ser: 0.76 mg/dL (ref 0.44–1.00)
GFR, Estimated: >60 mL/min (ref >60)
Glucose, Bld: 152 mg/dL — ABNORMAL HIGH (ref 70–99)
Potassium: 3.1 mmol/L — ABNORMAL LOW (ref 3.5–5.1)
Sodium: 141 mmol/L (ref 135–145)

## 2021-09-18 LAB — HAPTOGLOBIN: Haptoglobin: 121 mg/dL (ref 37–355)

## 2021-09-18 LAB — OCCULT BLOOD X 1 CARD TO LAB, STOOL: Fecal Occult Bld: POSITIVE — AB

## 2021-09-18 LAB — HEMOGLOBIN A1C
Hgb A1c MFr Bld: 5.7 % — ABNORMAL HIGH (ref 4.8–5.6)
Mean Plasma Glucose: 117 mg/dL

## 2021-09-18 MED ORDER — POTASSIUM CHLORIDE CRYS ER 20 MEQ PO TBCR
40.0000 meq | EXTENDED_RELEASE_TABLET | Freq: Once | ORAL | Status: AC
  Administered 2021-09-18: 40 meq via ORAL
  Filled 2021-09-18: qty 2

## 2021-09-18 MED ORDER — AMLODIPINE BESYLATE 10 MG PO TABS
10.0000 mg | ORAL_TABLET | Freq: Every day | ORAL | Status: DC
  Administered 2021-09-18 – 2021-09-22 (×5): 10 mg via ORAL
  Filled 2021-09-18 (×5): qty 1

## 2021-09-18 NOTE — Progress Notes (Addendum)
HEMATOLOGY-ONCOLOGY PROGRESS NOTE  ASSESSMENT AND PLAN: This is a 66 year old female patient with past medical history significant for diabetes, hypertension, dyslipidemia, cerebrovascular disease who was admitted with altered mental status, blurred vision, left-sided weakness, was found to have multiple right acute ischemic infarcts in the parieto-occipital region likely secondary to embolic source.  She was also found to have severe anemia on admission which was started and.  Her labs back in mid December entirely normal.    She has not had any obvious bleeding, but family member reports that she had a very dark stool yesterday.  Stool for occult blood has not been checked this admission. She has been recently started on aspirin, Plavix and Dantrium.  She remembers starting this medication called Dantrium in mid December. There is no evidence of hemolysis.  Given report of dark stool, recommend checking stool for occult blood.  I have ordered this.  Would also consider GI consult.  I wonder if there is acute bone marrow suppression from external source such as medications are drug-induced aplastic anemia.  I would recommend holding the Dantrium for now since this has been reported to cause some anemia and aplastic anemia in the past.  Hepatitis panel negative, HIV negative, no evidence of folate or B12 deficiency, iron level not decreased.  Status post 2 units PRBCs with improvement of her hemoglobin.  Recommend to maintain hemoglobin of 8 g/dL since acute stroke. If she continues develops worsening hemoglobin despite transfusion, we will consider bone marrow aspiration and biopsy inpatient.   Clenton Pare, DNP, AGPCNP-BC, AOCNP  SUBJECTIVE: Sitting up in recliner today.  Offers no specific complaints.  Family member at the bedside indicates that she had a very dark stool last evening.  No obvious bleeding noted.  REVIEW OF SYSTEMS:   Review of Systems  Constitutional:  Positive for fever and  malaise/fatigue. Negative for chills.  HENT: Negative.    Eyes: Negative.   Respiratory: Negative.    Cardiovascular: Negative.   Gastrointestinal:        Dark-colored stool 2/22  Skin: Negative.   Endo/Heme/Allergies: Negative.    I have reviewed the past medical history, past surgical history, social history and family history with the patient and they are unchanged from previous note.   PHYSICAL EXAMINATION:  Vitals:   09/18/21 0354 09/18/21 0754  BP: (!) 159/92 (!) 155/77  Pulse: 94 94  Resp: 15 14  Temp: 98.2 F (36.8 C) 98.6 F (37 C)  SpO2: 98% 97%   There were no vitals filed for this visit.  Intake/Output from previous day: 01/22 0701 - 01/23 0700 In: 630 [Blood:630] Out: -   Physical Exam Vitals reviewed.  Constitutional:      General: She is not in acute distress. HENT:     Head: Normocephalic.  Skin:    General: Skin is warm and dry.  Neurological:     Mental Status: She is alert and oriented to person, place, and time.  Psychiatric:        Mood and Affect: Mood normal.        Thought Content: Thought content normal.        Judgment: Judgment normal.    LABORATORY DATA:  I have reviewed the data as listed CMP Latest Ref Rng & Units 09/18/2021 09/16/2021 08/24/2021  Glucose 70 - 99 mg/dL 917(H) 150(V) 697(X)  BUN 8 - 23 mg/dL 10 21 23   Creatinine 0.44 - 1.00 mg/dL 4.80 1.65 5.37  Sodium 135 - 145 mmol/L 141 138  140  Potassium 3.5 - 5.1 mmol/L 3.1(L) 3.5 4.2  Chloride 98 - 111 mmol/L 110 107 106  CO2 22 - 32 mmol/L 23 23 24   Calcium 8.9 - 10.3 mg/dL 9.1 8.7(L) 10.0  Total Protein 6.5 - 8.1 g/dL - 5.5(L) -  Total Bilirubin 0.3 - 1.2 mg/dL - 0.3 -  Alkaline Phos 38 - 126 U/L - 57 -  AST 15 - 41 U/L - 17 -  ALT 0 - 44 U/L - 13 -    Lab Results  Component Value Date   WBC 10.2 09/18/2021   HGB 10.6 (L) 09/18/2021   HCT 30.7 (L) 09/18/2021   MCV 90.6 09/18/2021   PLT 524 (H) 09/18/2021   NEUTROABS 4.9 09/16/2021    No results found for:  CEA1, CEA, [id number], CA125, PSA1  CT ABDOMEN PELVIS W WO CONTRAST  Result Date: 09/16/2021 CLINICAL DATA:  Progressive anemia, retroperitoneal hemorrhage EXAM: CT ABDOMEN AND PELVIS WITHOUT AND WITH CONTRAST TECHNIQUE: Multidetector CT imaging of the abdomen and pelvis was performed following the standard protocol before and following the bolus administration of intravenous contrast. RADIATION DOSE REDUCTION: This exam was performed according to the departmental dose-optimization program which includes automated exposure control, adjustment of the mA and/or kV according to patient size and/or use of iterative reconstruction technique. CONTRAST:  47mL OMNIPAQUE IOHEXOL 350 MG/ML SOLN, 170mL OMNIPAQUE IOHEXOL 300 MG/ML SOLN COMPARISON:  09/22/2019 FINDINGS: Lower chest: No acute abnormality. Hepatobiliary: Status post cholecystectomy. Stable dystrophic calcifications within the gallbladder fossa. No intra or extrahepatic biliary ductal dilation. Liver unremarkable. Pancreas: Unremarkable Spleen: Unremarkable Adrenals/Urinary Tract: Adrenal glands are unremarkable. Kidneys are normal, without renal calculi, focal lesion, or hydronephrosis. Bladder is unremarkable. Stomach/Bowel: Stomach is within normal limits. Appendix appears normal. No evidence of bowel wall thickening, distention, or inflammatory changes. Vascular/Lymphatic: Aortic atherosclerosis. No enlarged abdominal or pelvic lymph nodes. Reproductive: Uterus and bilateral adnexa are unremarkable. Other: No retroperitoneal mass or hemorrhage identified. No abdominal wall hernia. The rectum is unremarkable. Musculoskeletal: No acute bone abnormality. Remote anterior wedge compression fractures of T12 and L1 are identified with minimal retropulsion. Remote superior endplate fracture of L4 noted. No lytic or blastic bone lesion. IMPRESSION: No radiographic explanation for the patient's progressive anemia. No retroperitoneal hemorrhage identified. No acute  intra-abdominal pathology identified. Aortic Atherosclerosis (ICD10-I70.0). Electronically Signed   By: Fidela Salisbury M.D.   On: 09/16/2021 23:51   CT ANGIO HEAD W OR WO CONTRAST  Result Date: 09/16/2021 CLINICAL DATA:  Follow-up examination for stroke. EXAM: CT ANGIOGRAPHY HEAD AND NECK TECHNIQUE: Multidetector CT imaging of the head and neck was performed using the standard protocol during bolus administration of intravenous contrast. Multiplanar CT image reconstructions and MIPs were obtained to evaluate the vascular anatomy. Carotid stenosis measurements (when applicable) are obtained utilizing NASCET criteria, using the distal internal carotid diameter as the denominator. RADIATION DOSE REDUCTION: This exam was performed according to the departmental dose-optimization program which includes automated exposure control, adjustment of the mA and/or kV according to patient size and/or use of iterative reconstruction technique. CONTRAST:  60mL OMNIPAQUE IOHEXOL 350 MG/ML SOLN COMPARISON:  MRI from earlier the same day as well is multiple previous CTAs. FINDINGS: CT HEAD FINDINGS Brain: Scattered small volume acute to subacute ischemic infarcts better evaluated on prior MRI performed immediately prior on the same day. No intracranial hemorrhage. Chronic left posterior MCA distribution infarct. Underlying severe chronic microvascular ischemic disease. Right middle cranial fossa arachnoid cyst. No other mass lesion or mass effect. Stable ventricular  size without hydrocephalus. No extra-axial collection. Vascular: No hyperdense vessel. Skull: Soft tissue protuberance at the scalp vertex, indeterminate, but similar to previous (series 5, image 71). Calvarium intact. Sinuses: Left sphenoid sinusitis. Mild mucosal thickening within the ethmoidal air cells and maxillary sinuses. Mastoid air cells are clear. Orbits: Right gaze noted. Review of the MIP images confirms the above findings CTA NECK FINDINGS Aortic arch:  Visualized aortic arch normal in caliber with normal branch pattern. Moderate atheromatous change about the arch and origin of the great vessels without significant stenosis. Appearance is stable. Right carotid system: Right common and internal carotid arteries are tortuous. Eccentric calcified plaque at the origin of the cervical right ICA with less than 50% stenosis by NASCET criteria, stable. No dissection or other acute finding. Left carotid system: Left common and internal carotid arteries are tortuous. Vascular stent spans the left carotid bifurcation. Stable stent patency. No dissection or other acute finding. Vertebral arteries: Both vertebral arteries arise from the subclavian arteries. No progressive proximal subclavian artery stenosis. Vertebral arteries remain patent without stenosis or dissection. Skeleton: No discrete or worrisome osseous lesions. Other neck: No other acute soft tissue abnormality within the neck. Upper chest: Unremarkable. Review of the MIP images confirms the above findings CTA HEAD FINDINGS Anterior circulation: Atheromatous change within the carotid siphons without significant or progressive stenosis. A1 segments and anterior communicating artery complex remain patent without abnormality. Atheromatous change throughout the ACAs without high-grade stenosis, stable. No new or progressive M1 stenosis. Severe atheromatous disease involving the bilateral MCA branches without proximal occlusion, relatively stable from prior. Posterior circulation: Atheromatous irregularity throughout the V4 segments without high-grade stenosis. Both PICA remain patent. Irregularity throughout the basilar without progressive stenosis. Superior cerebellar arteries remain patent at their origins. Both PCAs primarily supplied via the basilar. Advanced atheromatous disease involving the PCAs with associated severe bilateral P2 stenoses, stable. PCAs remain patent to their distal aspects. Venous sinuses:  Patent allowing for timing the contrast bolus. Anatomic variants: None significant.  No aneurysm. Review of the MIP images confirms the above findings IMPRESSION: 1. Stable CTA of the head and neck, with no evidence for large vessel occlusion or other emergent finding. 2. Advanced intracranial atherosclerotic disease, most pronounced at the bilateral into and P2 segments. 3. Stable patent left carotid stent. Stable less than 50% stenosis at the origin of the cervical right ICA. Electronically Signed   By: Jeannine Boga M.D.   On: 09/16/2021 22:36   CT ANGIO NECK W OR WO CONTRAST  Result Date: 09/16/2021 CLINICAL DATA:  Follow-up examination for stroke. EXAM: CT ANGIOGRAPHY HEAD AND NECK TECHNIQUE: Multidetector CT imaging of the head and neck was performed using the standard protocol during bolus administration of intravenous contrast. Multiplanar CT image reconstructions and MIPs were obtained to evaluate the vascular anatomy. Carotid stenosis measurements (when applicable) are obtained utilizing NASCET criteria, using the distal internal carotid diameter as the denominator. RADIATION DOSE REDUCTION: This exam was performed according to the departmental dose-optimization program which includes automated exposure control, adjustment of the mA and/or kV according to patient size and/or use of iterative reconstruction technique. CONTRAST:  35mL OMNIPAQUE IOHEXOL 350 MG/ML SOLN COMPARISON:  MRI from earlier the same day as well is multiple previous CTAs. FINDINGS: CT HEAD FINDINGS Brain: Scattered small volume acute to subacute ischemic infarcts better evaluated on prior MRI performed immediately prior on the same day. No intracranial hemorrhage. Chronic left posterior MCA distribution infarct. Underlying severe chronic microvascular ischemic disease. Right middle cranial fossa  arachnoid cyst. No other mass lesion or mass effect. Stable ventricular size without hydrocephalus. No extra-axial collection.  Vascular: No hyperdense vessel. Skull: Soft tissue protuberance at the scalp vertex, indeterminate, but similar to previous (series 5, image 71). Calvarium intact. Sinuses: Left sphenoid sinusitis. Mild mucosal thickening within the ethmoidal air cells and maxillary sinuses. Mastoid air cells are clear. Orbits: Right gaze noted. Review of the MIP images confirms the above findings CTA NECK FINDINGS Aortic arch: Visualized aortic arch normal in caliber with normal branch pattern. Moderate atheromatous change about the arch and origin of the great vessels without significant stenosis. Appearance is stable. Right carotid system: Right common and internal carotid arteries are tortuous. Eccentric calcified plaque at the origin of the cervical right ICA with less than 50% stenosis by NASCET criteria, stable. No dissection or other acute finding. Left carotid system: Left common and internal carotid arteries are tortuous. Vascular stent spans the left carotid bifurcation. Stable stent patency. No dissection or other acute finding. Vertebral arteries: Both vertebral arteries arise from the subclavian arteries. No progressive proximal subclavian artery stenosis. Vertebral arteries remain patent without stenosis or dissection. Skeleton: No discrete or worrisome osseous lesions. Other neck: No other acute soft tissue abnormality within the neck. Upper chest: Unremarkable. Review of the MIP images confirms the above findings CTA HEAD FINDINGS Anterior circulation: Atheromatous change within the carotid siphons without significant or progressive stenosis. A1 segments and anterior communicating artery complex remain patent without abnormality. Atheromatous change throughout the ACAs without high-grade stenosis, stable. No new or progressive M1 stenosis. Severe atheromatous disease involving the bilateral MCA branches without proximal occlusion, relatively stable from prior. Posterior circulation: Atheromatous irregularity  throughout the V4 segments without high-grade stenosis. Both PICA remain patent. Irregularity throughout the basilar without progressive stenosis. Superior cerebellar arteries remain patent at their origins. Both PCAs primarily supplied via the basilar. Advanced atheromatous disease involving the PCAs with associated severe bilateral P2 stenoses, stable. PCAs remain patent to their distal aspects. Venous sinuses: Patent allowing for timing the contrast bolus. Anatomic variants: None significant.  No aneurysm. Review of the MIP images confirms the above findings IMPRESSION: 1. Stable CTA of the head and neck, with no evidence for large vessel occlusion or other emergent finding. 2. Advanced intracranial atherosclerotic disease, most pronounced at the bilateral into and P2 segments. 3. Stable patent left carotid stent. Stable less than 50% stenosis at the origin of the cervical right ICA. Electronically Signed   By: Jeannine Boga M.D.   On: 09/16/2021 22:36   MR BRAIN WO CONTRAST  Result Date: 09/16/2021 CLINICAL DATA:  Initial evaluation for neuro deficit, stroke suspected. EXAM: MRI HEAD WITHOUT CONTRAST TECHNIQUE: Multiplanar, multiecho pulse sequences of the brain and surrounding structures were obtained without intravenous contrast. COMPARISON:  Prior CT and MRI from 06/21/2021. FINDINGS: Brain: Examination mildly degraded by motion artifact. Age-related cerebral atrophy with advanced chronic microvascular ischemic disease. Remote posterior left MCA distribution infarct involving the left frontoparietal region. Scattered remote lacunar infarcts present about the hemispheric cerebral white matter and deep gray nuclei. Patchy areas of restricted diffusion involving the cortical and subcortical aspect of the right parieto-occipital region, consistent with acute ischemic infarcts, posterior right MCA and/or MCA/PCA watershed distribution (series 5, image 83). Largest area of infarction measures 1.8 cm at  the periatrial white matter. No associated hemorrhage or mass effect. Additional mild patchy diffusion abnormality seen at the posterior left splenium and adjacent subcortical left occipital lobe (series 5, images 79, 72), consistent with a  few additional small acute to early subacute ischemic infarcts. No associated hemorrhage or mass effect at this location. Otherwise, no other evidence for new or interval infarction. No acute intracranial hemorrhage. Chronic blood products at the left anterior corpus callosum noted. Benign arachnoid cyst noted at the right middle cranial fossa. No other mass lesion, mass effect, or midline shift. No hydrocephalus or extra-axial fluid collection. Pituitary gland suprasellar region normal. Midline structures intact. Vascular: Major intracranial vascular flow voids are maintained. Skull and upper cervical spine: Craniocervical junction normal. Bone marrow signal intensity normal. A scalp soft tissue abnormality. Sinuses/Orbits: Right gaze noted. Moderate secretions noted within the left sphenoid sinus. Scattered mucosal thickening noted within the ethmoidal air cells. Mastoid air cells are clear. Other: None. IMPRESSION: 1. Patchy acute ischemic infarcts involving the right parieto-occipital region as above. No associated hemorrhage or mass effect. 2. Additional small volume acute to early subacute ischemic infarcts involving the posterior left splenium and adjacent subcortical left occipital lobe. 3. Underlying age-related cerebral atrophy with advanced chronic microvascular ischemic disease, with multiple additional remote infarcts as above. Electronically Signed   By: Jeannine Boga M.D.   On: 09/16/2021 19:03   DG Chest Portable 1 View  Result Date: 08/24/2021 CLINICAL DATA:  Diffuse chest wall pain. EXAM: PORTABLE CHEST 1 VIEW COMPARISON:  Chest x-ray dated April 02, 2021. FINDINGS: The heart size and mediastinal contours are within normal limits. Both lungs are  clear. The visualized skeletal structures are unremarkable. IMPRESSION: No active disease. Electronically Signed   By: Titus Dubin M.D.   On: 08/24/2021 12:15   ECHOCARDIOGRAM COMPLETE  Result Date: 09/17/2021    ECHOCARDIOGRAM REPORT   Patient Name:   CHARLSEY TUN Date of Exam: 09/17/2021 Medical Rec #:  [id number]         Height:       66.0 in Accession #:    [id number]        Weight:       160.1 lb Date of Birth:  1956-05-24         BSA:          1.819 m Patient Age:    18 years          BP:           149/79 mmHg Patient Gender: F                 HR:           93 bpm. Exam Location:  Inpatient Procedure: 2D Echo, Cardiac Doppler and Color Doppler Indications:    Stroke I63.9  History:        Patient has prior history of Echocardiogram examinations, most                 recent 06/19/2021. Stroke; Risk Factors:Hypertension and                 Dyslipidemia.  Sonographer:    Merrie Roof RDCS Referring Phys: [place]  1. Left ventricular ejection fraction, by estimation, is 60 to 65%. The left ventricle has normal function. The left ventricle has no regional wall motion abnormalities. There is mild left ventricular hypertrophy. Left ventricular diastolic parameters were normal.  2. Right ventricular systolic function is normal. The right ventricular size is normal. There is normal pulmonary artery systolic pressure. The estimated right ventricular systolic pressure is 123456 mmHg.  3. The mitral valve is normal in structure. No evidence of mitral valve regurgitation.  4. The aortic valve was not well visualized. Aortic valve regurgitation is not visualized. No aortic stenosis is present. FINDINGS  Left Ventricle: Left ventricular ejection fraction, by estimation, is 60 to 65%. The left ventricle has normal function. The left ventricle has no regional wall motion abnormalities. The left ventricular internal cavity size was normal in size. There is  mild left ventricular hypertrophy.  Left ventricular diastolic parameters were normal. Right Ventricle: The right ventricular size is normal. No increase in right ventricular wall thickness. Right ventricular systolic function is normal. There is normal pulmonary artery systolic pressure. The tricuspid regurgitant velocity is 2.45 m/s, and  with an assumed right atrial pressure of 3 mmHg, the estimated right ventricular systolic pressure is 27.0 mmHg. Left Atrium: Left atrial size was normal in size. Right Atrium: Right atrial size was normal in size. Pericardium: There is no evidence of pericardial effusion. Mitral Valve: The mitral valve is normal in structure. Mild to moderate mitral annular calcification. No evidence of mitral valve regurgitation. Tricuspid Valve: The tricuspid valve is normal in structure. Tricuspid valve regurgitation is trivial. Aortic Valve: The aortic valve was not well visualized. Aortic valve regurgitation is not visualized. No aortic stenosis is present. Aortic valve mean gradient measures 7.0 mmHg. Aortic valve peak gradient measures 13.0 mmHg. Aortic valve area, by VTI measures 1.88 cm. Pulmonic Valve: The pulmonic valve was not well visualized. Pulmonic valve regurgitation is not visualized. Aorta: The aortic root and ascending aorta are structurally normal, with no evidence of dilitation. IAS/Shunts: The interatrial septum was not well visualized.  LEFT VENTRICLE PLAX 2D LVIDd:         4.10 cm   Diastology LVIDs:         2.70 cm   LV e' medial:    9.79 cm/s LV PW:         1.00 cm   LV E/e' medial:  11.4 LV IVS:        0.90 cm   LV e' lateral:   8.59 cm/s LVOT diam:     1.90 cm   LV E/e' lateral: 13.0 LV SV:         59 LV SV Index:   32 LVOT Area:     2.84 cm  RIGHT VENTRICLE RV Basal diam:  3.40 cm LEFT ATRIUM             Index        RIGHT ATRIUM           Index LA diam:        4.00 cm 2.20 cm/m   RA Area:     14.80 cm LA Vol (A2C):   63.1 ml 34.69 ml/m  RA Volume:   33.10 ml  18.20 ml/m LA Vol (A4C):   38.8 ml  21.33 ml/m LA Biplane Vol: 50.5 ml 27.76 ml/m  AORTIC VALVE AV Area (Vmax):    1.91 cm AV Area (Vmean):   1.93 cm AV Area (VTI):     1.88 cm AV Vmax:           180.00 cm/s AV Vmean:          123.000 cm/s AV VTI:            0.314 m AV Peak Grad:      13.0 mmHg AV Mean Grad:      7.0 mmHg LVOT Vmax:         121.00 cm/s LVOT Vmean:        83.800  cm/s LVOT VTI:          0.208 m LVOT/AV VTI ratio: 0.66  AORTA Ao Root diam: 3.10 cm Ao Asc diam:  3.50 cm MITRAL VALVE                TRICUSPID VALVE MV Area (PHT): 4.60 cm     TR Peak grad:   24.0 mmHg MV Decel Time: 165 msec     TR Vmax:        245.00 cm/s MV E velocity: 112.00 cm/s MV A velocity: 140.00 cm/s  SHUNTS MV E/A ratio:  0.80         Systemic VTI:  0.21 m                             Systemic Diam: 1.90 cm Oswaldo Milian MD Electronically signed by Oswaldo Milian MD Signature Date/Time: 09/17/2021/3:25:22 PM    Final      No future appointments.    LOS: 2 days   Attending Note  I personally saw the patient, reviewed the chart and examined the patient. The plan of care was discussed with the patient and the admitting team. I agree with the assessment and plan as documented above. Thank you very much for the consultation.  Patient has been doing better. Hemoglobin has improved more than expected. Son reported episode of black stool or dark stool. Would recommend considering GI on board if acute blood loss is a concern. So far, there doesn't appear to be a clear primary hematological cause No evidence of hemolysis. No evidence of nutritional deficiency. Will recommend following CBC while in patient. We will continue to follow her periodically.

## 2021-09-18 NOTE — Evaluation (Signed)
Occupational Therapy Evaluation Patient Details Name: Jade [name] MRN: [id number] DOB: Feb 14, 1956 Today's Date: 09/18/2021   History of Present Illness Pt is a 66 y/o female who presented 09/16/21 with poor visual tracking, incoordination, blurred vision, and leaning to L. MRI revealed patchy acute ischemic infarcts involving the right parieto-occipital region and additional small volume acute to early subacute ischemic infarcts involving the posterior left splenium and adjacent subcortical left occipital lobe. PMH: arthritis, CKD, COPD, CVAs with R hemiparesis, fibromyalgia, gout, HTN, migraine, OSA, DM2   Clinical Impression   Edmund reports requiring assist PTA, she uses a wc for most mobility and her son assists with ADLs as needed. She lives in her ex-husbands home and their son lives in a [place] in the driveway. Upon evaluation pt required max A +2 for bed mobility, sit<>stand and stand pivot transfer. She also requires up to max A +2 for LB ADLs. Pt is limited by residual R weakness and pain (RUE nighttime splint at baseline), poor sitting and standing balance, impaired cognition and impaired vision. Pt will benefit from OT acutely. Recommend d/c to SNF as pt's ex-husband and son are not able to safely care for her anymore, with hopes to transition her to ALF after SNF.      Recommendations for follow up therapy are one component of a multi-disciplinary discharge planning process, led by the attending physician.  Recommendations may be updated based on patient status, additional functional criteria and insurance authorization.   Follow Up Recommendations  Skilled nursing-short term rehab (<3 hours/day)    Assistance Recommended at Discharge Frequent or constant Supervision/Assistance  Patient can return home with the following A lot of help with walking and/or transfers;A lot of help with bathing/dressing/bathroom;Assistance with cooking/housework;Assistance with feeding;Direct  supervision/assist for medications management;Help with stairs or ramp for entrance;Assist for transportation    Functional Status Assessment  Patient has had a recent decline in their functional status and demonstrates the ability to make significant improvements in function in a reasonable and predictable amount of time.  Equipment Recommendations  Other (comment) (defer)    Recommendations for Other Services       Precautions / Restrictions Precautions Precautions: Fall Precaution Comments: Decreased vision on the L, prior stroke with R side weakness Restrictions Weight Bearing Restrictions: No      Mobility Bed Mobility Overal bed mobility: Needs Assistance Bed Mobility: Supine to Sit     Supine to sit: Max assist, +2 for physical assistance, HOB elevated     General bed mobility comments: Use of bed pad and +2 assist required for transition to EOB.    Transfers Overall transfer level: Needs assistance Equipment used: 2 person hand held assist Transfers: Sit to/from Stand, Bed to chair/wheelchair/BSC Sit to Stand: Max assist, +2 physical assistance Stand pivot transfers: Max assist, +2 physical assistance         General transfer comment: Heavy +2 assist for transition to chair. Therapists facilitated weight shifting but pt unable to effectively advance either LE towards the chair.      Balance Overall balance assessment: Needs assistance Sitting-balance support: Feet supported, No upper extremity supported Sitting balance-Leahy Scale: Poor Sitting balance - Comments: Varying levels of assist required for pt to maintain sitting balance at EOB. Max assist progressing to min guard assist. Postural control: Left lateral lean Standing balance support: Bilateral upper extremity supported Standing balance-Leahy Scale: Zero Standing balance comment: +2 required  ADL either performed or assessed with clinical judgement   ADL  Overall ADL's : Needs assistance/impaired Eating/Feeding: Set up;Sitting   Grooming: Set up;Sitting   Upper Body Bathing: Moderate assistance;Sitting   Lower Body Bathing: Maximal assistance;+2 for physical assistance;+2 for safety/equipment;Sit to/from stand   Upper Body Dressing : Moderate assistance;Sitting   Lower Body Dressing: Maximal assistance;+2 for physical assistance;+2 for safety/equipment;Sit to/from stand   Toilet Transfer: Maximal assistance;+2 for physical assistance;+2 for safety/equipment;Stand-pivot;BSC/3in1 Toilet Transfer Details (indicate cue type and reason): simulated from bed>chair. max A +2 face to face transfer Toileting- Clothing Manipulation and Hygiene: Maximal assistance;+2 for physical assistance;+2 for safety/equipment;Sit to/from stand       Functional mobility during ADLs: Maximal assistance;+2 for physical assistance;+2 for safety/equipment General ADL Comments: pt is limited by RUE residual weakness and pain, RLE buckling, poor cognition and balance in both sitting and standing     Vision Baseline Vision/History: 1 Wears glasses Ability to See in Adequate Light: 2 Moderately impaired Patient Visual Report: Other (comment) (pt states there is no change in her vision. Unsure of new change vs. bseline visual deficits.) Vision Assessment?: Vision impaired- to be further tested in functional context Additional Comments: Pt's vision is severely restricted in the L visual field. She did not track to the L and could not locate her cup when placed to the L. Assessment was limited by pt's cogntion/ability to follow commands.     Perception     Praxis      Pertinent Vitals/Pain Pain Assessment Pain Assessment: Faces Faces Pain Scale: Hurts little more Pain Location: R UE, RLE with movement Pain Descriptors / Indicators: Grimacing, Moaning Pain Intervention(s): Limited activity within patient's tolerance, Monitored during session     Hand Dominance  Right   Extremity/Trunk Assessment Upper Extremity Assessment Upper Extremity Assessment: RUE deficits/detail;LUE deficits/detail RUE Deficits / Details: Residual weakness and painful ROM, decreased sensation. hand in fisted posturing upon arrival, pt is able to actively go into full extension with increasaed time/effort. Pt wears a night  time spint. RUE: Unable to fully assess due to pain RUE Sensation: decreased light touch;decreased proprioception RUE Coordination: decreased fine motor;decreased gross motor LUE Deficits / Details: overall WFL, generalized weakness and limited overhead ROM.   Lower Extremity Assessment Lower Extremity Assessment: Defer to PT evaluation RLE Deficits / Details: Decreased strength consistent with residual weakness from prior stroke. LLE Deficits / Details: Decreased strength, grossly 3+ to 4-/5. Unclear what baseline strength is.   Cervical / Trunk Assessment Cervical / Trunk Assessment: Kyphotic   Communication Communication Communication: No difficulties   Cognition Arousal/Alertness: Awake/alert Behavior During Therapy: Flat affect Overall Cognitive Status: Impaired/Different from baseline Area of Impairment: Safety/judgement, Following commands, Problem solving                       Following Commands: Follows one step commands consistently, Follows one step commands with increased time Safety/Judgement: Decreased awareness of deficits, Decreased awareness of safety   Problem Solving: Slow processing, Decreased initiation, Difficulty sequencing, Requires tactile cues, Requires verbal cues       General Comments  VSS on RA, ex-husband present    Exercises     Shoulder Instructions      Home Living Family/patient expects to be discharged to:: Private residence Living Arrangements: Spouse/significant other Available Help at Discharge: Family;Available PRN/intermittently Type of Home: House Home Access: Stairs to enter [employer] of Steps: 2 Entrance Stairs-Rails: Right;Left;Can reach both Home Layout: One level  Bathroom Shower/Tub: Producer, television/film/video: Standard Bathroom Accessibility: Yes   Home Equipment: Agricultural consultant (2 wheels);BSC/3in1;Wheelchair - manual   Additional Comments: Son apparently lives in his truck/van outside the patient's home and comes in to assist the pt as needed when she calls him.  Lives With: Family    Prior Functioning/Environment Prior Level of Function : Needs assist             Mobility Comments: Uses the RW occasionally but otherwise is in the wheelchair. ADLs Comments: Pt has been sponge bathing, needs help with UB/LB dressing. Does not drive. Occasionally goes out to the grocery store/store. Son comes to assist pt when she calls him during the day while ex-husband is at work.        OT Problem List: Decreased strength;Decreased range of motion;Decreased activity tolerance;Impaired balance (sitting and/or standing);Decreased cognition;Decreased coordination;Impaired vision/perception;Decreased safety awareness;Pain;Impaired UE functional use      OT Treatment/Interventions: Self-care/ADL training;Therapeutic exercise;DME and/or AE instruction;Therapeutic activities;Balance training    OT Goals(Current goals can be found in the care plan section) Acute Rehab OT Goals Patient Stated Goal: home OT Goal Formulation: With patient Time For Goal Achievement: 10/02/21 Potential to Achieve Goals: Good ADL Goals Pt Will Perform Upper Body Dressing: with set-up;sitting Pt Will Perform Lower Body Dressing: with modified independence;with set-up;sit to/from stand Pt Will Transfer to Toilet: with modified independence;stand pivot transfer;bedside commode Additional ADL Goal #1: Pt will indep complete bed mobility as a precursor to ADLs  OT Frequency: Min 2X/week    Co-evaluation PT/OT/SLP Co-Evaluation/Treatment: Yes Reason for Co-Treatment:  Complexity of the patient's impairments (multi-system involvement);For patient/therapist safety;To address functional/ADL transfers   OT goals addressed during session: ADL's and self-care      AM-PAC OT "6 Clicks" Daily Activity     Outcome Measure Help from another person eating meals?: A Little Help from another person taking care of personal grooming?: A Little Help from another person toileting, which includes using toliet, bedpan, or urinal?: A Lot Help from another person bathing (including washing, rinsing, drying)?: A Lot Help from another person to put on and taking off regular upper body clothing?: A Lot Help from another person to put on and taking off regular lower body clothing?: A Lot 6 Click Score: 14   End of Session Equipment Utilized During Treatment: Gait belt Nurse Communication: Mobility status  Activity Tolerance: Patient tolerated treatment well Patient left: in chair;with call bell/phone within reach;with chair alarm set  OT Visit Diagnosis: Unsteadiness on feet (R26.81);Other abnormalities of gait and mobility (R26.89);Muscle weakness (generalized) (M62.81);History of falling (Z91.81);Pain                Time: 1610-9604 OT Time Calculation (min): 30 min Charges:  OT General Charges $OT Visit: 1 Visit OT Evaluation $OT Eval Moderate Complexity: 1 Mod   Yoni Lobos A Per Beagley 09/18/2021, 12:22 PM

## 2021-09-18 NOTE — Progress Notes (Signed)
Oncology Discharge Planning Admission Note  [facility] Address: [address] [place], [place], [place] [address] Hours of Operation:  8am - 5pm, Monday - Friday  Clinic Contact Information:  [phone]) [phone]  Oncology Care Team: Medical Oncologist:    Jenell Milliner, NP is aware of this hospital admission dated 09/16/21 and has assessed patient at bedside. The cancer center will follow [facility] inpatient care to assist with discharge planning as indicated by the oncologist.  We will reach out  closer to discharge date to arrange follow up care.  Disclaimer:  This Cancer Center nursing note does not imply a formal consult request has been made by the admitting attending for this admission or there will be an inpatient consult completed by oncology.  Please request oncology consults as per standard process as indicated.

## 2021-09-18 NOTE — Evaluation (Signed)
Physical Therapy Evaluation Patient Details Name: Jade [name] MRN: [id number] DOB: [birth year] Today's Date: 09/18/2021  History of Present Illness  Pt is a 66 y/o female who presented 09/16/21 with poor visual tracking, incoordination, blurred vision, and leaning to L. MRI revealed patchy acute ischemic infarcts involving the right parieto-occipital region and additional small volume acute to early subacute ischemic infarcts involving the posterior left splenium and adjacent subcortical left occipital lobe. PMH: arthritis, CKD, COPD, CVAs with R hemiparesis, fibromyalgia, gout, HTN, migraine, OSA, DM2   Clinical Impression  Pt admitted with above diagnosis. Pt currently with functional limitations due to the deficits listed below (see PT Problem List). At the time of PT eval pt was able to perform transfers with up to +2 max assist for balance support and safety. Recommend +2 assist and the [facility] for nursing staff to transfer pt to/from recliner and BSC. Pt voicing that she wants to return home as soon as possible. Pt introduces husband in the room, however he later reports to this therapist that they have been divorced for 5 years. Pt's ex-husband became emotional and states that he was not able to physically assist the patient PTA and feels she needs to be in skilled nursing care or assisted living. It does not appear that pt can tolerate AIR level therapies at this time, nor will have the home support for AIR. Of note, may want to utilize Spanish interpreter when communicating with the ex-husband. Pt will benefit from skilled PT to increase their independence and safety with mobility to allow discharge to the venue listed below.          Recommendations for follow up therapy are one component of a multi-disciplinary discharge planning process, led by the attending physician.  Recommendations may be updated based on patient status, additional functional criteria and insurance  authorization.  Follow Up Recommendations Skilled nursing-short term rehab (<3 hours/day)    Assistance Recommended at Discharge Frequent or constant Supervision/Assistance  Patient can return home with the following  Two people to help with walking and/or transfers;Two people to help with bathing/dressing/bathroom    Equipment Recommendations Other (comment) (TBD by next venue of care)  Recommendations for Other Services       Functional Status Assessment Patient has had a recent decline in their functional status and demonstrates the ability to make significant improvements in function in a reasonable and predictable amount of time.     Precautions / Restrictions Precautions Precautions: Fall Precaution Comments: Decreased vision on the L, prior stroke with R side weakness Restrictions Weight Bearing Restrictions: No      Mobility  Bed Mobility Overal bed mobility: Needs Assistance Bed Mobility: Supine to Sit     Supine to sit: Max assist, +2 for physical assistance, HOB elevated     General bed mobility comments: Use of bed pad and +2 assist required for transition to EOB.    Transfers Overall transfer level: Needs assistance Equipment used: 2 person hand held assist Transfers: Sit to/from Stand, Bed to chair/wheelchair/BSC Sit to Stand: Max assist, +2 physical assistance Stand pivot transfers: Max assist, +2 physical assistance         General transfer comment: Heavy +2 assist for transition to chair. Therapists facilitated weight shifting but pt unable to effectively advance either LE towards the chair.    Ambulation/Gait               General Gait Details: Unable to attempt ambulation at this time.  Stairs  Wheelchair Mobility    Modified Rankin (Stroke Patients Only) Modified Rankin (Stroke Patients Only) Pre-Morbid Rankin Score: Moderately severe disability Modified Rankin: Severe disability     Balance Overall balance  assessment: Needs assistance Sitting-balance support: Feet supported, No upper extremity supported Sitting balance-Leahy Scale: Poor Sitting balance - Comments: Varying levels of assist required for pt to maintain sitting balance at EOB. Max assist progressing to min guard assist. Postural control: Left lateral lean Standing balance support: Bilateral upper extremity supported Standing balance-Leahy Scale: Zero Standing balance comment: +2 required                             Pertinent Vitals/Pain Pain Assessment Pain Assessment: Faces Faces Pain Scale: Hurts little more Pain Location: R UE, RLE with movement Pain Descriptors / Indicators: Grimacing, Moaning Pain Intervention(s): Limited activity within patient's tolerance, Monitored during session, Repositioned    Home Living Family/patient expects to be discharged to:: Private residence Living Arrangements: Spouse/significant other (ex-husband) Available Help at Discharge: Family;Available PRN/intermittently Type of Home: House Home Access: Stairs to enter Entrance Stairs-Rails: Right;Left;Can reach both Entrance Stairs-Number of Steps: 2   Home Layout: One level Home Equipment: Conservation officer, nature (2 wheels);BSC/3in1;Wheelchair - manual Additional Comments: Son apparently lives in his truck/van outside the patient's home and comes in to assist the pt as needed when she calls him.    Prior Function Prior Level of Function : Needs assist             Mobility Comments: Uses the RW occasionally but otherwise is in the wheelchair. ADLs Comments: Pt has been sponge bathing, needs help with UB/LB dressing. Does not drive. Occasionally goes out to the grocery store/store. Son comes to assist pt when she calls him during the day while ex-husband is at work.     Hand Dominance   Dominant Hand: Right    Extremity/Trunk Assessment   Upper Extremity Assessment Upper Extremity Assessment: Defer to OT evaluation     Lower Extremity Assessment Lower Extremity Assessment: RLE deficits/detail;LLE deficits/detail RLE Deficits / Details: Decreased strength consistent with residual weakness from prior stroke. LLE Deficits / Details: Decreased strength, grossly 3+ to 4-/5. Unclear what baseline strength is.    Cervical / Trunk Assessment Cervical / Trunk Assessment: Kyphotic  Communication   Communication: No difficulties  Cognition Arousal/Alertness: Awake/alert Behavior During Therapy: Flat affect Overall Cognitive Status: Impaired/Different from baseline Area of Impairment: Safety/judgement, Following commands, Problem solving                       Following Commands: Follows one step commands consistently, Follows one step commands with increased time Safety/Judgement: Decreased awareness of deficits, Decreased awareness of safety   Problem Solving: Slow processing, Decreased initiation, Difficulty sequencing, Requires tactile cues, Requires verbal cues          General Comments      Exercises     Assessment/Plan    PT Assessment Patient needs continued PT services  PT Problem List Decreased strength;Decreased activity tolerance;Decreased balance;Decreased mobility;Decreased cognition;Decreased knowledge of use of DME;Decreased safety awareness;Decreased coordination;Decreased knowledge of precautions;Pain       PT Treatment Interventions DME instruction;Gait training;Functional mobility training;Therapeutic activities;Therapeutic exercise;Neuromuscular re-education;Patient/family education    PT Goals (Current goals can be found in the Care Plan section)  Acute Rehab PT Goals Patient Stated Goal: Pt reports she wants to go home PT Goal Formulation: With patient/family Time For Goal Achievement: 10/02/21 Potential  to Achieve Goals: Fair    Frequency Min 3X/week     Co-evaluation               AM-PAC PT "6 Clicks" Mobility  Outcome Measure Help needed turning  from your back to your side while in a flat bed without using bedrails?: Total Help needed moving from lying on your back to sitting on the side of a flat bed without using bedrails?: Total Help needed moving to and from a bed to a chair (including a wheelchair)?: Total Help needed standing up from a chair using your arms (e.g., wheelchair or bedside chair)?: Total Help needed to walk in hospital room?: Total Help needed climbing 3-5 steps with a railing? : Total 6 Click Score: 6    End of Session Equipment Utilized During Treatment: Gait belt Activity Tolerance: Patient limited by fatigue (weakness) Patient left: in chair;with call bell/phone within reach;with chair alarm set;with family/visitor present Nurse Communication: Mobility status;Need for lift equipment (Stedy recommended for back to bed) PT Visit Diagnosis: Unsteadiness on feet (R26.81);Difficulty in walking, not elsewhere classified (R26.2);Other symptoms and signs involving the nervous system (R29.898);Other abnormalities of gait and mobility (R26.89)    Time: [id number] PT Time Calculation (min) (ACUTE ONLY): 43 min   Charges:   PT Evaluation $PT Eval Moderate Complexity: 1 Mod PT Treatments $Therapeutic Activity: 8-22 mins        Rolinda Roan, PT, DPT Acute Rehabilitation Services Pager: [phone] Office: [phone]   Thelma Comp 09/18/2021, 11:28 AM

## 2021-09-18 NOTE — Evaluation (Signed)
Speech Language Pathology Evaluation Patient Details Name: Jade [name] MRN: [id number] DOB: October 12, 1955 Today's Date: 09/18/2021 Time: [id number] SLP Time Calculation (min) (ACUTE ONLY): 22 min  Problem List:  Patient Active Problem List   Diagnosis Date Noted   Acute arterial ischemic stroke, multifocal, posterior circulation, unspecified laterality ([place]) 09/16/2021   Anemia 09/16/2021   COVID-19 06/25/2021   Cerebral thrombosis with cerebral infarction 06/19/2021   Weakness due to old stroke 06/17/2021   Labile blood glucose    Essential hypertension    Spastic hemiparesis (HCC)    Stage 3a chronic kidney disease (HCC)    Controlled type 2 diabetes mellitus with hyperglycemia, without long-term current use of insulin ([place])    CVA (cerebral vascular accident) ([place]) 04/13/2021   TIA (transient ischemic attack) 04/02/2021   Carotid artery stenosis with cerebral infarction ([place]) 04/02/2021   COPD (chronic obstructive pulmonary disease) ([place]) 04/02/2021   Stroke-like symptoms 03/05/2021   Acute CVA (cerebrovascular accident) ([place]) 03/21/2018   Obesity, Class III, BMI 40-49.9 (morbid obesity) ([place]) 03/21/2018   IBS (irritable bowel syndrome) 04/12/2015   Hair loss 10/20/2014   Primary osteoarthritis of left knee 09/29/2014   Visit for screening mammogram 08/30/2014   Routine general medical examination at a health care facility 08/30/2014   Tinea corporis 12/21/2013   Essential hypertension, benign 12/21/2013   Low back pain 08/24/2013   Patient noncompliant with statin medication 02/23/2013   H/O abnormal Pap smear 10/24/2012   Osteopenia 09/26/2012   Diabetic neuropathy, painful ([place]) 05/26/2012   Diabetes mellitus type 2 with neurological manifestations ([place]) 03/20/2012   Pure hypercholesterolemia 03/20/2012   Chronic venous insufficiency 03/20/2012   Past Medical History:  Past Medical History:  Diagnosis Date   Arthritis    "back, knees, some in my left hip"  (03/21/2018)   Chronic kidney disease    "was told I had 25% kidney function in early 2012" (03/21/2018)   COPD (chronic obstructive pulmonary disease) ([place])    CVA (cerebral vascular accident) ([place]) 03/21/2018   "numb all over; head to toe" (03/21/2018)   Fibromyalgia    History of gout    Hyperlipidemia    Hypertension    Migraine    "used to get them monthly during menopause" (03/21/2018)   Neuromuscular disorder ([place])    OSA (obstructive sleep apnea)    "don't tolerate the machine" (03/21/2018)   Pneumonia ~ 2007   Type 2 diabetes, diet controlled ([place])    Past Surgical History:  Past Surgical History:  Procedure Laterality Date   HERNIA REPAIR     LAPAROSCOPIC CHOLECYSTECTOMY  06/2007   "no UHR w/this" (03/21/2018)   TONSILLECTOMY     TRANSCAROTID ARTERY REVASCULARIZATION  Left 04/06/2021   Procedure: LEFT TRANSCAROTID ARTERY REVASCULARIZATION;  Surgeon: Cherre Robins, MD;  Location: [facility] OR;  Service: Vascular;  Laterality: Left;   ULTRASOUND GUIDANCE FOR VASCULAR ACCESS Right 04/06/2021   Procedure: ULTRASOUND GUIDANCE FOR VASCULAR ACCESS;  Surgeon: Cherre Robins, MD;  Location[facility];  Service: Vascular;  Laterality: Right;   UMBILICAL HERNIA REPAIR  2009   HPI:  Pt is a 66 y.o. female who presented with AMS and blurred vision for a few days. MRI revealed multifocal posterior circulation infarcts felt to be embolic per neurology. PMH: CVA, left carotid stenosis status post stent, hypertension, diabetes melitis. SLUMS 04/14/21: 22/26 with dififculty noted during generative naming, mentap manipulation, and recall.   Assessment / Plan / Recommendation Clinical Impression  Pt participated in speech/language/cognition evaluation. 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 She stated that she was independent with medication and financial management prior to admission. Pt reported acute changes in cognition and she stated that she "feels confused". Motor speech  and language skills were [facility] Mental Status Examination was completed to evaluate the pt's cognitive-linguistic skills. Pt's score was adjusted to eliminate tasks which required writing. She achieved an adjusted score of 12/24 which is below the normal limits. She exhibited difficulty in the areas of temporal orientation, attention, memory, and executive function. Pt benefited from additional processing time throughout tasks and repetition was intermittently necessary. Skilled SLP services are clinically indicated at this time to improve cognitive-linguistic function.    SLP Assessment  SLP Recommendation/Assessment: Patient needs continued Speech Lanaguage Pathology Services    Recommendations for follow up therapy are one component of a multi-disciplinary discharge planning process, led by the attending physician.  Recommendations may be updated based on patient status, additional functional criteria and insurance authorization.    Follow Up Recommendations   (Continued SLP services at level of care recommended by PT/OT)    Assistance Recommended at Discharge  Intermittent Supervision/Assistance  Functional Status Assessment Patient has had a recent decline in their functional status and demonstrates the ability to make significant improvements in function in a reasonable and predictable amount of time.  Frequency and Duration min 2x/week  2 weeks      SLP Evaluation Cognition  Overall Cognitive Status: Impaired/Different from baseline Arousal/Alertness: Awake/alert Orientation Level: Oriented to person;Oriented to place;Disoriented to time;Oriented to situation Year: 2023 Month: September Day of Week: Correct Attention: Focused;Sustained;Selective Focused Attention: Appears intact Sustained Attention: Appears intact Selective Attention: Impaired Selective Attention Impairment: Verbal complex Memory: Impaired Memory Impairment: Retrieval deficit (Immediate:  5/5; delayed: 0/5; with cues: 3/5) Awareness: Appears intact Problem Solving: Appears intact Executive Function: Sequencing       Comprehension  Auditory Comprehension Overall Auditory Comprehension: Appears within functional limits for tasks assessed Yes/No Questions: Within Functional Limits Commands: Within Functional Limits Conversation: Complex    Expression Expression Primary Mode of Expression: Verbal Verbal Expression Overall Verbal Expression: Appears within functional limits for tasks assessed Initiation: No impairment Naming: No impairment Pragmatics: Impairment Impairments: Abnormal affect   Oral / Motor  Oral Motor/Sensory Function Overall Oral Motor/Sensory Function: Within functional limits Motor Speech Overall Motor Speech: Appears within functional limits for tasks assessed Respiration: Within functional limits Phonation: Normal Resonance: Within functional limits Intelligibility: Intelligible Motor Planning: Witnin functional limits Motor Speech Errors: Not applicable           Atley [place] I. Hardin Negus, [place], [place] Office number [phone] Pager [place] 09/18/2021, 9:47 AM

## 2021-09-18 NOTE — Plan of Care (Signed)

## 2021-09-18 NOTE — Progress Notes (Addendum)
PROGRESS NOTE    Jade [name]   [id number]  DOB: 1956-03-12  DOA: 09/16/2021 PCP: Sue Lush, PA-C   Brief Narrative:  Jade [name] is a 66 year old female with a history of CVA, left carotid stenosis status post stent, hypertension who presents to the ED with blurred vision for a few days. MRI revealed multifocal posterior circulation infarcts felt to be embolic per neurology Also noted to have a hemoglobin around 7 which is a major drop from the 12-15 range about 1 month ago.  She initially declined a blood transfusion.   Subjective:  We discussed her fever last night. No sore throat, runny nose, cough or diarrhea. Being assisted to the [facility] to urinate and no dysuria. Wants to go home.  Assessment & Plan:   Principal Problem:   Acute arterial ischemic stroke, multifocal, posterior circulation, unspecified laterality ([place]) -In addition to visual disturbances, she tells me that she is had difficulty ambulating at home and has been off balance -Possibly watershed infarcts due to significant drop in hemoglobin in the setting of severe stenosis - was discharged home on aspirin and Plavix on 06/30/2021 with a plan to continue both for 2 months and then  Plavix alone -At this time both are on hold  Active Problems:  Acute anemia, normocytic - She is not aware of any bleeding no signs of bleeding in the hospital - Hemoglobin dropped from 7 to 5.3 the next day --not dilutional as she did not receive any IV fluids- initially declined transfusion but later accepted 2 U PRBC - I have asked for a hematology opinion who has recommended to hold Dantrolene (this was a new med) - Hb up to 10 today- cont to follow until tomorrow to ensure stability- f/u on hematology input  Fever - temp 101 last night - no infectious source- ? Drug reaction to Dantrolene   HTN - resume Amlodipine today      Wheelchair-bound - Mostly wheelchair-bound- lives with her husband-son lives  nearby   DVT prophylaxis: SCDs Code Status: DNR Family Communication: son Level of Care: Level of care: Progressive Disposition Plan:  Status is: Inpatient  Remains inpatient appropriate because: acute anemia, CVAs  Consultants:  Neurology Hematology Antimicrobials:  Anti-infectives (From admission, onward)    None        Objective: Vitals:   09/17/21 2141 09/17/21 2355 09/18/21 0354 09/18/21 0754  BP:  (!) 162/80 (!) 159/92 (!) 155/77  Pulse:  (!) 101 94 94  Resp:  20 15 14   Temp: 99.3 F (37.4 C) 98.5 F (36.9 C) 98.2 F (36.8 C) 98.6 F (37 C)  TempSrc: Oral Oral Oral Oral  SpO2:  100% 98% 97%    Intake/Output Summary (Last 24 hours) at 09/18/2021 1021 Last data filed at 09/17/2021 1836 Gross per 24 hour  Intake 630 ml  Output --  Net 630 ml   There were no vitals filed for this visit.  Examination: General exam: Appears comfortable  HEENT:  oral mucosa moist, no sclera icterus or thrush Respiratory system: Clear to auscultation. Respiratory effort normal. Cardiovascular system: S1 & S2 heard, regular rate and rhythm Gastrointestinal system: Abdomen soft, non-tender, nondistended. Normal bowel sounds   Extremities: No cyanosis, clubbing or edema Skin: No rashes or ulcers Psychiatry:  flat affect    Data Reviewed: I have personally reviewed following labs and imaging studies  CBC: Recent Labs  Lab 09/16/21 1423 09/16/21 2211 09/17/21 0426 09/18/21 0731  WBC 6.6  --  7.7  10.2  NEUTROABS 4.9  --   --   --   HGB 7.7* 5.9* 5.3* 10.6*  HCT 24.6* 18.4* 17.0* 30.7*  MCV 98.0  --  97.7 90.6  PLT 372  --  459* 524*    Basic Metabolic Panel: Recent Labs  Lab 09/16/21 1423 09/18/21 0731  NA 138 141  K 3.5 3.1*  CL 107 110  CO2 23 23  GLUCOSE 155* 152*  BUN 21 10  CREATININE 0.80 0.76  CALCIUM 8.7* 9.1    GFR: CrCl cannot be calculated (Unknown ideal weight.). Liver Function Tests: Recent Labs  Lab 09/16/21 1423  AST 17  ALT 13   ALKPHOS 57  BILITOT 0.3  PROT 5.5*  ALBUMIN 3.2*    No results for input(s): LIPASE, AMYLASE in the last 168 hours. No results for input(s): AMMONIA in the last 168 hours. Coagulation Profile: Recent Labs  Lab 09/16/21 1423  INR 1.0    Cardiac Enzymes: No results for input(s): CKTOTAL, CKMB, CKMBINDEX, TROPONINI in the last 168 hours. BNP (last 3 results) No results for input(s): PROBNP in the last 8760 hours. HbA1C: No results for input(s): HGBA1C in the last 72 hours. CBG: No results for input(s): GLUCAP in the last 168 hours. Lipid Profile: Recent Labs    09/17/21 0426  CHOL 126  HDL 43  LDLCALC 57  TRIG 128  CHOLHDL 2.9    Thyroid Function Tests: No results for input(s): TSH, T4TOTAL, FREET4, T3FREE, THYROIDAB in the last 72 hours. Anemia Panel: Recent Labs    09/17/21 2022  VITAMINB12 370  FOLATE 14.0  TIBC 356  IRON 195*  RETICCTPCT 4.2*   Urine analysis:    Component Value Date/Time   COLORURINE YELLOW 09/16/2021 1423   APPEARANCEUR HAZY (A) 09/16/2021 1423   LABSPEC >1.046 (H) 09/16/2021 1423   PHURINE 5.0 09/16/2021 1423   GLUCOSEU NEGATIVE 09/16/2021 1423   GLUCOSEU NEGATIVE 03/08/2015 0855   HGBUR NEGATIVE 09/16/2021 [place] 09/16/2021 1423   KETONESUR NEGATIVE 09/16/2021 1423   PROTEINUR NEGATIVE 09/16/2021 1423   UROBILINOGEN 0.2 03/08/2015 0855   NITRITE NEGATIVE 09/16/2021 1423   LEUKOCYTESUR MODERATE (A) 09/16/2021 1423   Sepsis Labs: @LABRCNTIP (procalcitonin:4,lacticidven:4) ) Recent Results (from the past 240 hour(s))  Resp Panel by RT-PCR (Flu A&B, Covid) Nasopharyngeal Swab     Status: None   Collection Time: 09/16/21  4:04 PM   Specimen: Nasopharyngeal Swab; Nasopharyngeal(NP) swabs in vial transport medium  Result Value Ref Range Status   SARS Coronavirus 2 by RT PCR NEGATIVE NEGATIVE Final    Comment: (NOTE) SARS-CoV-2 target nucleic acids are NOT DETECTED.  The SARS-CoV-2 RNA is generally  detectable in upper respiratory specimens during the acute phase of infection. The lowest concentration of SARS-CoV-2 viral copies this assay can detect is 138 copies/mL. A negative result does not preclude SARS-Cov-2 infection and should not be used as the sole basis for treatment or other patient management decisions. A negative result may occur with  improper specimen collection/handling, submission of specimen other than nasopharyngeal swab, presence of viral mutation(s) within the areas targeted by this assay, and inadequate number of viral copies(<138 copies/mL). A negative result must be combined with clinical observations, patient history, and epidemiological information. The expected result is Negative.  Fact Sheet for Patients:  EntrepreneurPulse.com.au  Fact Sheet for Healthcare Providers:  IncredibleEmployment.be  This test is no t yet approved or cleared by the [place] FDA and  has been authorized for detection and/or diagnosis  of SARS-CoV-2 by FDA under an Emergency Use Authorization (EUA). This EUA will remain  in effect (meaning this test can be used) for the duration of the COVID-19 declaration under Section 564(b)(1) of the Act, 21 U.S.C.section 360bbb-3(b)(1), unless the authorization is terminated  or revoked sooner.       Influenza A by PCR NEGATIVE NEGATIVE Final   Influenza B by PCR NEGATIVE NEGATIVE Final    Comment: (NOTE) The Xpert Xpress SARS-CoV-2/FLU/RSV plus assay is intended as an aid in the diagnosis of influenza from Nasopharyngeal swab specimens and should not be used as a sole basis for treatment. Nasal washings and aspirates are unacceptable for Xpert Xpress SARS-CoV-2/FLU/RSV testing.  Fact Sheet for Patients: EntrepreneurPulse.com.au  Fact Sheet for Healthcare Providers: IncredibleEmployment.be  This test is not yet approved or cleared by the [place] FDA  and has been authorized for detection and/or diagnosis of SARS-CoV-2 by FDA under an Emergency Use Authorization (EUA). This EUA will remain in effect (meaning this test can be used) for the duration of the COVID-19 declaration under Section 564(b)(1) of the Act, 21 U.S.C. section 360bbb-3(b)(1), unless the authorization is terminated or revoked.  Performed at [facility], [place] [address], [place], [place] [address]          Radiology Studies: CT ABDOMEN PELVIS W WO CONTRAST  Result Date: 09/16/2021 CLINICAL DATA:  Progressive anemia, retroperitoneal hemorrhage EXAM: CT ABDOMEN AND PELVIS WITHOUT AND WITH CONTRAST TECHNIQUE: Multidetector CT imaging of the abdomen and pelvis was performed following the standard protocol before and following the bolus administration of intravenous contrast. RADIATION DOSE REDUCTION: This exam was performed according to the departmental dose-optimization program which includes automated exposure control, adjustment of the mA and/or kV according to patient size and/or use of iterative reconstruction technique. CONTRAST:  53mL OMNIPAQUE IOHEXOL 350 MG/ML SOLN, 185mL OMNIPAQUE IOHEXOL 300 MG/ML SOLN COMPARISON:  09/22/2019 FINDINGS: Lower chest: No acute abnormality. Hepatobiliary: Status post cholecystectomy. Stable dystrophic calcifications within the gallbladder fossa. No intra or extrahepatic biliary ductal dilation. Liver unremarkable. Pancreas: Unremarkable Spleen: Unremarkable Adrenals/Urinary Tract: Adrenal glands are unremarkable. Kidneys are normal, without renal calculi, focal lesion, or hydronephrosis. Bladder is unremarkable. Stomach/Bowel: Stomach is within normal limits. Appendix appears normal. No evidence of bowel wall thickening, distention, or inflammatory changes. Vascular/Lymphatic: Aortic atherosclerosis. No enlarged abdominal or pelvic lymph nodes. Reproductive: Uterus and bilateral adnexa are unremarkable. Other: No retroperitoneal mass  or hemorrhage identified. No abdominal wall hernia. The rectum is unremarkable. Musculoskeletal: No acute bone abnormality. Remote anterior wedge compression fractures of T12 and L1 are identified with minimal retropulsion. Remote superior endplate fracture of L4 noted. No lytic or blastic bone lesion. IMPRESSION: No radiographic explanation for the patient's progressive anemia. No retroperitoneal hemorrhage identified. No acute intra-abdominal pathology identified. Aortic Atherosclerosis (ICD10-I70.0). Electronically Signed   By: Fidela Salisbury M.D.   On: 09/16/2021 23:51   CT ANGIO HEAD W OR WO CONTRAST  Result Date: 09/16/2021 CLINICAL DATA:  Follow-up examination for stroke. EXAM: CT ANGIOGRAPHY HEAD AND NECK TECHNIQUE: Multidetector CT imaging of the head and neck was performed using the standard protocol during bolus administration of intravenous contrast. Multiplanar CT image reconstructions and MIPs were obtained to evaluate the vascular anatomy. Carotid stenosis measurements (when applicable) are obtained utilizing NASCET criteria, using the distal internal carotid diameter as the denominator. RADIATION DOSE REDUCTION: This exam was performed according to the departmental dose-optimization program which includes automated exposure control, adjustment of the mA and/or kV according to patient size  and/or use of iterative reconstruction technique. CONTRAST:  55mL OMNIPAQUE IOHEXOL 350 MG/ML SOLN COMPARISON:  MRI from earlier the same day as well is multiple previous CTAs. FINDINGS: CT HEAD FINDINGS Brain: Scattered small volume acute to subacute ischemic infarcts better evaluated on prior MRI performed immediately prior on the same day. No intracranial hemorrhage. Chronic left posterior MCA distribution infarct. Underlying severe chronic microvascular ischemic disease. Right middle cranial fossa arachnoid cyst. No other mass lesion or mass effect. Stable ventricular size without hydrocephalus. No  extra-axial collection. Vascular: No hyperdense vessel. Skull: Soft tissue protuberance at the scalp vertex, indeterminate, but similar to previous (series 5, image 71). Calvarium intact. Sinuses: Left sphenoid sinusitis. Mild mucosal thickening within the ethmoidal air cells and maxillary sinuses. Mastoid air cells are clear. Orbits: Right gaze noted. Review of the MIP images confirms the above findings CTA NECK FINDINGS Aortic arch: Visualized aortic arch normal in caliber with normal branch pattern. Moderate atheromatous change about the arch and origin of the great vessels without significant stenosis. Appearance is stable. Right carotid system: Right common and internal carotid arteries are tortuous. Eccentric calcified plaque at the origin of the cervical right ICA with less than 50% stenosis by NASCET criteria, stable. No dissection or other acute finding. Left carotid system: Left common and internal carotid arteries are tortuous. Vascular stent spans the left carotid bifurcation. Stable stent patency. No dissection or other acute finding. Vertebral arteries: Both vertebral arteries arise from the subclavian arteries. No progressive proximal subclavian artery stenosis. Vertebral arteries remain patent without stenosis or dissection. Skeleton: No discrete or worrisome osseous lesions. Other neck: No other acute soft tissue abnormality within the neck. Upper chest: Unremarkable. Review of the MIP images confirms the above findings CTA HEAD FINDINGS Anterior circulation: Atheromatous change within the carotid siphons without significant or progressive stenosis. A1 segments and anterior communicating artery complex remain patent without abnormality. Atheromatous change throughout the ACAs without high-grade stenosis, stable. No new or progressive M1 stenosis. Severe atheromatous disease involving the bilateral MCA branches without proximal occlusion, relatively stable from prior. Posterior circulation:  Atheromatous irregularity throughout the V4 segments without high-grade stenosis. Both PICA remain patent. Irregularity throughout the basilar without progressive stenosis. Superior cerebellar arteries remain patent at their origins. Both PCAs primarily supplied via the basilar. Advanced atheromatous disease involving the PCAs with associated severe bilateral P2 stenoses, stable. PCAs remain patent to their distal aspects. Venous sinuses: Patent allowing for timing the contrast bolus. Anatomic variants: None significant.  No aneurysm. Review of the MIP images confirms the above findings IMPRESSION: 1. Stable CTA of the head and neck, with no evidence for large vessel occlusion or other emergent finding. 2. Advanced intracranial atherosclerotic disease, most pronounced at the bilateral into and P2 segments. 3. Stable patent left carotid stent. Stable less than 50% stenosis at the origin of the cervical right ICA. Electronically Signed   By: Rise Mu M.D.   On: 09/16/2021 22:36   CT ANGIO NECK W OR WO CONTRAST  Result Date: 09/16/2021 CLINICAL DATA:  Follow-up examination for stroke. EXAM: CT ANGIOGRAPHY HEAD AND NECK TECHNIQUE: Multidetector CT imaging of the head and neck was performed using the standard protocol during bolus administration of intravenous contrast. Multiplanar CT image reconstructions and MIPs were obtained to evaluate the vascular anatomy. Carotid stenosis measurements (when applicable) are obtained utilizing NASCET criteria, using the distal internal carotid diameter as the denominator. RADIATION DOSE REDUCTION: This exam was performed according to the departmental dose-optimization program which includes automated exposure  control, adjustment of the mA and/or kV according to patient size and/or use of iterative reconstruction technique. CONTRAST:  31mL OMNIPAQUE IOHEXOL 350 MG/ML SOLN COMPARISON:  MRI from earlier the same day as well is multiple previous CTAs. FINDINGS: CT HEAD  FINDINGS Brain: Scattered small volume acute to subacute ischemic infarcts better evaluated on prior MRI performed immediately prior on the same day. No intracranial hemorrhage. Chronic left posterior MCA distribution infarct. Underlying severe chronic microvascular ischemic disease. Right middle cranial fossa arachnoid cyst. No other mass lesion or mass effect. Stable ventricular size without hydrocephalus. No extra-axial collection. Vascular: No hyperdense vessel. Skull: Soft tissue protuberance at the scalp vertex, indeterminate, but similar to previous (series 5, image 71). Calvarium intact. Sinuses: Left sphenoid sinusitis. Mild mucosal thickening within the ethmoidal air cells and maxillary sinuses. Mastoid air cells are clear. Orbits: Right gaze noted. Review of the MIP images confirms the above findings CTA NECK FINDINGS Aortic arch: Visualized aortic arch normal in caliber with normal branch pattern. Moderate atheromatous change about the arch and origin of the great vessels without significant stenosis. Appearance is stable. Right carotid system: Right common and internal carotid arteries are tortuous. Eccentric calcified plaque at the origin of the cervical right ICA with less than 50% stenosis by NASCET criteria, stable. No dissection or other acute finding. Left carotid system: Left common and internal carotid arteries are tortuous. Vascular stent spans the left carotid bifurcation. Stable stent patency. No dissection or other acute finding. Vertebral arteries: Both vertebral arteries arise from the subclavian arteries. No progressive proximal subclavian artery stenosis. Vertebral arteries remain patent without stenosis or dissection. Skeleton: No discrete or worrisome osseous lesions. Other neck: No other acute soft tissue abnormality within the neck. Upper chest: Unremarkable. Review of the MIP images confirms the above findings CTA HEAD FINDINGS Anterior circulation: Atheromatous change within the  carotid siphons without significant or progressive stenosis. A1 segments and anterior communicating artery complex remain patent without abnormality. Atheromatous change throughout the ACAs without high-grade stenosis, stable. No new or progressive M1 stenosis. Severe atheromatous disease involving the bilateral MCA branches without proximal occlusion, relatively stable from prior. Posterior circulation: Atheromatous irregularity throughout the V4 segments without high-grade stenosis. Both PICA remain patent. Irregularity throughout the basilar without progressive stenosis. Superior cerebellar arteries remain patent at their origins. Both PCAs primarily supplied via the basilar. Advanced atheromatous disease involving the PCAs with associated severe bilateral P2 stenoses, stable. PCAs remain patent to their distal aspects. Venous sinuses: Patent allowing for timing the contrast bolus. Anatomic variants: None significant.  No aneurysm. Review of the MIP images confirms the above findings IMPRESSION: 1. Stable CTA of the head and neck, with no evidence for large vessel occlusion or other emergent finding. 2. Advanced intracranial atherosclerotic disease, most pronounced at the bilateral into and P2 segments. 3. Stable patent left carotid stent. Stable less than 50% stenosis at the origin of the cervical right ICA. Electronically Signed   By: Jeannine Boga M.D.   On: 09/16/2021 22:36   MR BRAIN WO CONTRAST  Result Date: 09/16/2021 CLINICAL DATA:  Initial evaluation for neuro deficit, stroke suspected. EXAM: MRI HEAD WITHOUT CONTRAST TECHNIQUE: Multiplanar, multiecho pulse sequences of the brain and surrounding structures were obtained without intravenous contrast. COMPARISON:  Prior CT and MRI from 06/21/2021. FINDINGS: Brain: Examination mildly degraded by motion artifact. Age-related cerebral atrophy with advanced chronic microvascular ischemic disease. Remote posterior left MCA distribution infarct  involving the left frontoparietal region. Scattered remote lacunar infarcts present about the  hemispheric cerebral white matter and deep gray nuclei. Patchy areas of restricted diffusion involving the cortical and subcortical aspect of the right parieto-occipital region, consistent with acute ischemic infarcts, posterior right MCA and/or MCA/PCA watershed distribution (series 5, image 83). Largest area of infarction measures 1.8 cm at the periatrial white matter. No associated hemorrhage or mass effect. Additional mild patchy diffusion abnormality seen at the posterior left splenium and adjacent subcortical left occipital lobe (series 5, images 79, 72), consistent with a few additional small acute to early subacute ischemic infarcts. No associated hemorrhage or mass effect at this location. Otherwise, no other evidence for new or interval infarction. No acute intracranial hemorrhage. Chronic blood products at the left anterior corpus callosum noted. Benign arachnoid cyst noted at the right middle cranial fossa. No other mass lesion, mass effect, or midline shift. No hydrocephalus or extra-axial fluid collection. Pituitary gland suprasellar region normal. Midline structures intact. Vascular: Major intracranial vascular flow voids are maintained. Skull and upper cervical spine: Craniocervical junction normal. Bone marrow signal intensity normal. A scalp soft tissue abnormality. Sinuses/Orbits: Right gaze noted. Moderate secretions noted within the left sphenoid sinus. Scattered mucosal thickening noted within the ethmoidal air cells. Mastoid air cells are clear. Other: None. IMPRESSION: 1. Patchy acute ischemic infarcts involving the right parieto-occipital region as above. No associated hemorrhage or mass effect. 2. Additional small volume acute to early subacute ischemic infarcts involving the posterior left splenium and adjacent subcortical left occipital lobe. 3. Underlying age-related cerebral atrophy with  advanced chronic microvascular ischemic disease, with multiple additional remote infarcts as above. Electronically Signed   By: Jeannine Boga M.D.   On: 09/16/2021 19:03   ECHOCARDIOGRAM COMPLETE  Result Date: 09/17/2021    ECHOCARDIOGRAM REPORT   Patient Name:   YURICO BASNETT Date of Exam: 09/17/2021 Medical Rec #:  [id number]         Height:       66.0 in Accession #:    [id number]        Weight:       160.1 lb Date of Birth:  [birth year]         BSA:          1.819 m Patient Age:    37 years          BP:           149/79 mmHg Patient Gender: F                 HR:           93 bpm. Exam Location:  Inpatient Procedure: 2D Echo, Cardiac Doppler and Color Doppler Indications:    Stroke I63.9  History:        Patient has prior history of Echocardiogram examinations, most                 recent 06/19/2021. Stroke; Risk Factors:Hypertension and                 Dyslipidemia.  Sonographer:    Merrie Roof RDCS Referring Phys: [place]  1. Left ventricular ejection fraction, by estimation, is 60 to 65%. The left ventricle has normal function. The left ventricle has no regional wall motion abnormalities. There is mild left ventricular hypertrophy. Left ventricular diastolic parameters were normal.  2. Right ventricular systolic function is normal. The right ventricular size is normal. There is normal pulmonary artery systolic pressure. The estimated right ventricular systolic pressure is 123456 mmHg.  3.  The mitral valve is normal in structure. No evidence of mitral valve regurgitation.  4. The aortic valve was not well visualized. Aortic valve regurgitation is not visualized. No aortic stenosis is present. FINDINGS  Left Ventricle: Left ventricular ejection fraction, by estimation, is 60 to 65%. The left ventricle has normal function. The left ventricle has no regional wall motion abnormalities. The left ventricular internal cavity size was normal in size. There is  mild left ventricular  hypertrophy. Left ventricular diastolic parameters were normal. Right Ventricle: The right ventricular size is normal. No increase in right ventricular wall thickness. Right ventricular systolic function is normal. There is normal pulmonary artery systolic pressure. The tricuspid regurgitant velocity is 2.45 m/s, and  with an assumed right atrial pressure of 3 mmHg, the estimated right ventricular systolic pressure is 123456 mmHg. Left Atrium: Left atrial size was normal in size. Right Atrium: Right atrial size was normal in size. Pericardium: There is no evidence of pericardial effusion. Mitral Valve: The mitral valve is normal in structure. Mild to moderate mitral annular calcification. No evidence of mitral valve regurgitation. Tricuspid Valve: The tricuspid valve is normal in structure. Tricuspid valve regurgitation is trivial. Aortic Valve: The aortic valve was not well visualized. Aortic valve regurgitation is not visualized. No aortic stenosis is present. Aortic valve mean gradient measures 7.0 mmHg. Aortic valve peak gradient measures 13.0 mmHg. Aortic valve area, by VTI measures 1.88 cm. Pulmonic Valve: The pulmonic valve was not well visualized. Pulmonic valve regurgitation is not visualized. Aorta: The aortic root and ascending aorta are structurally normal, with no evidence of dilitation. IAS/Shunts: The interatrial septum was not well visualized.  LEFT VENTRICLE PLAX 2D LVIDd:         4.10 cm   Diastology LVIDs:         2.70 cm   LV e' medial:    9.79 cm/s LV PW:         1.00 cm   LV E/e' medial:  11.4 LV IVS:        0.90 cm   LV e' lateral:   8.59 cm/s LVOT diam:     1.90 cm   LV E/e' lateral: 13.0 LV SV:         59 LV SV Index:   32 LVOT Area:     2.84 cm  RIGHT VENTRICLE RV Basal diam:  3.40 cm LEFT ATRIUM             Index        RIGHT ATRIUM           Index LA diam:        4.00 cm 2.20 cm/m   RA Area:     14.80 cm LA Vol (A2C):   63.1 ml 34.69 ml/m  RA Volume:   33.10 ml  18.20 ml/m LA Vol  (A4C):   38.8 ml 21.33 ml/m LA Biplane Vol: 50.5 ml 27.76 ml/m  AORTIC VALVE AV Area (Vmax):    1.91 cm AV Area (Vmean):   1.93 cm AV Area (VTI):     1.88 cm AV Vmax:           180.00 cm/s AV Vmean:          123.000 cm/s AV VTI:            0.314 m AV Peak Grad:      13.0 mmHg AV Mean Grad:      7.0 mmHg LVOT Vmax:  121.00 cm/s LVOT Vmean:        83.800 cm/s LVOT VTI:          0.208 m LVOT/AV VTI ratio: 0.66  AORTA Ao Root diam: 3.10 cm Ao Asc diam:  3.50 cm MITRAL VALVE                TRICUSPID VALVE MV Area (PHT): 4.60 cm     TR Peak grad:   24.0 mmHg MV Decel Time: 165 msec     TR Vmax:        245.00 cm/s MV E velocity: 112.00 cm/s MV A velocity: 140.00 cm/s  SHUNTS MV E/A ratio:  0.80         Systemic VTI:  0.21 m                             Systemic Diam: 1.90 cm Oswaldo Milian MD Electronically signed by Oswaldo Milian MD Signature Date/Time: 09/17/2021/3:25:22 PM    Final       Scheduled Meds:  sodium chloride   Intravenous Once   sodium chloride   Intravenous Once   atorvastatin  80 mg Oral Daily   tiZANidine  2 mg Oral QHS   Continuous Infusions:   LOS: 2 days      Debbe Odea, MD [facility] Pager: [email or web address] 09/18/2021, 10:21 AM

## 2021-09-18 NOTE — NC FL2 (Signed)
[place] MEDICAID FL2 LEVEL OF CARE SCREENING TOOL     IDENTIFICATION  Patient Name: Jade [name] Birthdate: Sep 09, 1955 Sex: female Admission Date (Current Location): 09/16/2021  [facility] and [place] Number:  Herbalist and Address:  [facility], [place] [address], [place], [place] [address]      Provider Number: [id number]  Attending Physician Name and Address:  Debbe Odea, MD  Relative Name and Phone Number:       Current Level of Care: Hospital Recommended Level of Care: [place] Prior Approval Number:    Date Approved/Denied:   PASRR Number: [id number]  Discharge Plan: SNF    Current Diagnoses: Patient Active Problem List   Diagnosis Date Noted   Acute arterial ischemic stroke, multifocal, posterior circulation, unspecified laterality ([place]) 09/16/2021   Anemia 09/16/2021   COVID-19 06/25/2021   Cerebral thrombosis with cerebral infarction 06/19/2021   Weakness due to old stroke 06/17/2021   Labile blood glucose    Essential hypertension    Spastic hemiparesis ([place])    Stage 3a chronic kidney disease ([place])    Controlled type 2 diabetes mellitus with hyperglycemia, without long-term current use of insulin ([place])    CVA (cerebral vascular accident) ([place]) 04/13/2021   TIA (transient ischemic attack) 04/02/2021   Carotid artery stenosis with cerebral infarction ([place]) 04/02/2021   COPD (chronic obstructive pulmonary disease) ([place]) 04/02/2021   Stroke-like symptoms 03/05/2021   Acute CVA (cerebrovascular accident) ([place]) 03/21/2018   Obesity, Class III, BMI 40-49.9 (morbid obesity) ([place]) 03/21/2018   IBS (irritable bowel syndrome) 04/12/2015   Hair loss 10/20/2014   Primary osteoarthritis of left knee 09/29/2014   Visit for screening mammogram 08/30/2014   Routine general medical examination at a health care facility 08/30/2014   Tinea corporis 12/21/2013   Essential hypertension, benign 12/21/2013   Low back  pain 08/24/2013   Patient noncompliant with statin medication 02/23/2013   H/O abnormal Pap smear 10/24/2012   Osteopenia 09/26/2012   Diabetic neuropathy, painful ([place]) 05/26/2012   Diabetes mellitus type 2 with neurological manifestations ([place]) 03/20/2012   Pure hypercholesterolemia 03/20/2012   Chronic venous insufficiency 03/20/2012    Orientation RESPIRATION BLADDER Height & Weight     Self, Situation, Place  Normal Incontinent Weight:   Height:     BEHAVIORAL SYMPTOMS/MOOD NEUROLOGICAL BOWEL NUTRITION STATUS      Continent Diet (heart healthy with thin liquids)  AMBULATORY STATUS COMMUNICATION OF NEEDS Skin   Extensive Assist Verbally Normal                       Personal Care Assistance Level of Assistance  Bathing, Feeding, Dressing Bathing Assistance: Maximum assistance Feeding assistance: Limited assistance Dressing Assistance: Limited assistance     Functional Limitations Info  Sight, Hearing, Speech Sight Info: Adequate Hearing Info: Adequate Speech Info: Adequate    SPECIAL CARE FACTORS FREQUENCY  PT (By licensed PT), OT (By licensed OT)     PT Frequency: 5x/wk OT Frequency: 5x/wk            Contractures Contractures Info: Not present    Additional Factors Info  Code Status, Allergies Code Status Info: DNR Allergies Info: Baclofen/ Nickel           Current Medications (09/18/2021):  This is the current hospital active medication list Current Facility-Administered Medications  Medication Dose Route Frequency Provider Last Rate Last Admin   0.9 %  sodium chloride infusion (Manually program via Guardrails  IV Fluids)   Intravenous Once Jennette Kettle M, DO       0.9 %  sodium chloride infusion (Manually program via Guardrails IV Fluids)   Intravenous Once Debbe Odea, MD   Held at 09/17/21 [phone]   acetaminophen (TYLENOL) tablet 650 mg  650 mg Oral Q4H PRN Etta Quill, DO   650 mg at 09/17/21 2030   Or   acetaminophen (TYLENOL) 160  MG/5ML solution 650 mg  650 mg Per Tube Q4H PRN Etta Quill, DO       Or   acetaminophen (TYLENOL) suppository 650 mg  650 mg Rectal Q4H PRN Etta Quill, DO       amLODipine (NORVASC) tablet 10 mg  10 mg Oral Daily Rizwan, Eunice Blase, MD       atorvastatin (LIPITOR) tablet 80 mg  80 mg Oral Daily Jennette Kettle M, DO   80 mg at 09/17/21 [id number]   potassium chloride SA (KLOR-CON M) CR tablet 40 mEq  40 mEq Oral Once Debbe Odea, MD       tiZANidine (ZANAFLEX) tablet 2 mg  2 mg Oral QHS Jennette Kettle M, DO   2 mg at 09/17/21 2141     Discharge Medications: Please see discharge summary for a list of discharge medications.  Relevant Imaging Results:  Relevant Lab Results:   Additional Information SS# [id number].  Pollie Friar, RN

## 2021-09-18 NOTE — TOC Initial Note (Addendum)
Transition of Care [facility]) - Initial/Assessment Note    Patient Details  Name: Jade [name] MRN: [id number] Date of Birth: [birth year]  Transition of [facility] CM/SW Contact:    Pollie Friar, RN Phone Number: 09/18/2021, 11:42 AM  Clinical Narrative:                 Patient has been living with her ex-spouse at his home and patients son lives in a bus outside the home. The son comes in when pt needs assistance to help. Currently the recommendations are for SNF rehab. CM met with the patient, son, ex-spouse and interpreter (for the ex-spouse who speaks spanish) to go over the plan. They are all in agreement with SNF rehab and asked to be faxed out in the [facility] area.  Family also agreeable to having referral sent to a [facility] to assist with potential ALF after a rehab stay. CM will send the referrals out and provide bed offers as they become available. Pt will need authorization from SNF prior to d/c to rehab.  ToC following.  1400: CM called and spoke to [place] over the phone about bed offers for SNF. CM has also left a copy of patients choices in the room. Son to review and update CM once decision is made.   Expected Discharge Plan: Skilled Nursing Facility Barriers to Discharge: Continued Medical Work up   Patient Goals and CMS Choice   CMS Medicare.gov Compare Post Acute Care list provided to:: Patient Choice offered to / list presented to : Patient, Adult Children  Expected Discharge Plan and Services Expected Discharge Plan: [place] In-house Referral: Clinical Social Work Discharge Planning Services: CM Consult Post Acute Care Choice: [place] arrangements for the past 2 months: [place]                                      Prior Living Arrangements/Services Living arrangements for the past 2 months: Single Family Home Lives with:: Other (Comment) (ex spouse/ son lives in bus outside of  home) Patient language and need for interpreter reviewed:: Yes Do you feel safe going back to the place where you live?: Yes      Need for Family Participation in Patient Care: Yes (Comment) Care giver support system in place?: No (comment)   Criminal Activity/Legal Involvement Pertinent to Current Situation/Hospitalization: No - Comment as needed  Activities of Daily Living Home Assistive Devices/Equipment: Wheelchair ADL Screening (condition at time of admission) Patient's cognitive ability adequate to safely complete daily activities?: Yes Is the patient deaf or have difficulty hearing?: No Does the patient have difficulty seeing, even when wearing glasses/contacts?: Yes Does the patient have difficulty concentrating, remembering, or making decisions?: No Patient able to express need for assistance with ADLs?: Yes Does the patient have difficulty dressing or bathing?: Yes Independently performs ADLs?: No Communication: Independent Dressing (OT): Needs assistance Is this a change from baseline?: Pre-admission baseline Grooming: Needs assistance Is this a change from baseline?: Pre-admission baseline Feeding: Independent Bathing: Needs assistance Is this a change from baseline?: Pre-admission baseline Toileting: Needs assistance Is this a change from baseline?: Pre-admission baseline In/Out Bed: Needs assistance Is this a change from baseline?: Pre-admission baseline Walks in Home: Needs assistance Is this a change from baseline?: Pre-admission baseline Does the patient have difficulty walking or climbing stairs?: Yes Weakness of Legs: Both Weakness of  Arms/Hands: Right  Permission Sought/Granted                  Emotional Assessment Appearance:: Appears stated age Attitude/Demeanor/Rapport: Engaged Affect (typically observed): Accepting Orientation: : Oriented to Self, Oriented to Place, Oriented to Situation   Psych Involvement: No (comment)  Admission  diagnosis:  Tachycardia [R00.0] Acute arterial ischemic stroke, multifocal, posterior circulation, unspecified laterality ([place]) [I63.539] Cerebrovascular accident (CVA), unspecified mechanism ([place]) [I63.9] Patient Active Problem List   Diagnosis Date Noted   Acute arterial ischemic stroke, multifocal, posterior circulation, unspecified laterality ([place]) 09/16/2021   Anemia 09/16/2021   COVID-19 06/25/2021   Cerebral thrombosis with cerebral infarction 06/19/2021   Weakness due to old stroke 06/17/2021   Labile blood glucose    Essential hypertension    Spastic hemiparesis (HCC)    Stage 3a chronic kidney disease (HCC)    Controlled type 2 diabetes mellitus with hyperglycemia, without long-term current use of insulin ([place])    CVA (cerebral vascular accident) ([place]) 04/13/2021   TIA (transient ischemic attack) 04/02/2021   Carotid artery stenosis with cerebral infarction ([place]) 04/02/2021   COPD (chronic obstructive pulmonary disease) ([place]) 04/02/2021   Stroke-like symptoms 03/05/2021   Acute CVA (cerebrovascular accident) ([place]) 03/21/2018   Obesity, Class III, BMI 40-49.9 (morbid obesity) ([place]) 03/21/2018   IBS (irritable bowel syndrome) 04/12/2015   Hair loss 10/20/2014   Primary osteoarthritis of left knee 09/29/2014   Visit for screening mammogram 08/30/2014   Routine general medical examination at a health care facility 08/30/2014   Tinea corporis 12/21/2013   Essential hypertension, benign 12/21/2013   Low back pain 08/24/2013   Patient noncompliant with statin medication 02/23/2013   H/O abnormal Pap smear 10/24/2012   Osteopenia 09/26/2012   Diabetic neuropathy, painful ([place]) 05/26/2012   Diabetes mellitus type 2 with neurological manifestations ([place]) 03/20/2012   Pure hypercholesterolemia 03/20/2012   Chronic venous insufficiency 03/20/2012   PCP:  Sue Lush, PA-C Pharmacy:   [facility], [place] [address] Phone: [phone] Fax: [phone]     Social Determinants of Health (SDOH) Interventions    Readmission Risk Interventions Readmission Risk Prevention Plan 06/30/2021  Transportation Screening Complete  PCP or Specialist Appt within 3-5 Days Complete  HRI or Home Care Consult Complete  Social Work Consult for [place] Planning/Counseling Complete  Palliative Care Screening Not Applicable  Some recent data might be hidden

## 2021-09-18 NOTE — Progress Notes (Addendum)
STROKE TEAM PROGRESS NOTE   ATTENDING NOTE: I reviewed above note and agree with the assessment and plan. Pt was seen and examined.   Husband at bedside.  Patient sitting in chair, neuro stable, unchanged.  Had 2 units PRBC transfusion yesterday, today hemoglobin 10.6->9.1.  Fecal occult blood positive.  Oncology on board.  Given fecal occult blood positive, recommend GI follow-up for possible GI bleeding.  Okay to hold off antiplatelet for now, however once hemoglobin stable and improved and GI clears, recommend aspirin 81 for stroke prevention.  Continue Lipitor 80.  PT and OT recommend SNF.  For detailed assessment and plan, please refer to above as I have made changes wherever appropriate.   Neurology will sign off. Please call with questions. Pt will follow up with stroke clinic Dr. Leonie Man at [facility] in about 4 weeks. Thanks for the consult.   [name], MD PhD Stroke Neurology 09/18/2021 7:50 PM    INTERVAL HISTORY Her ex-husband is at the bedside. Patient reports desire to return home but this is not an available option per ex-husband as he cannot take care of her. Patient reports weakness improving and PT/OT recommending SNF at this time. Patient did agree to go to SNF after some discussion.  Vitals:   09/17/21 2141 09/17/21 2355 09/18/21 0354 09/18/21 0754  BP:  (!) 162/80 (!) 159/92 (!) 155/77  Pulse:  (!) 101 94 94  Resp:  20 15 14   Temp: 99.3 F (37.4 C) 98.5 F (36.9 C) 98.2 F (36.8 C) 98.6 F (37 C)  TempSrc: Oral Oral Oral Oral  SpO2:  100% 98% 97%   CBC:  Recent Labs  Lab 09/16/21 1423 09/16/21 2211 09/17/21 0426 09/18/21 0731  WBC 6.6  --  7.7 10.2  NEUTROABS 4.9  --   --   --   HGB 7.7*   < > 5.3* 10.6*  HCT 24.6*   < > 17.0* 30.7*  MCV 98.0  --  97.7 90.6  PLT 372  --  459* 524*   < > = values in this interval not displayed.    Basic Metabolic Panel:  Recent Labs  Lab 09/16/21 1423 09/18/21 0731  NA 138 141  K 3.5 3.1*  CL 107 110  CO2 23 23   GLUCOSE 155* 152*  BUN 21 10  CREATININE 0.80 0.76  CALCIUM 8.7* 9.1    Lipid Panel:  Recent Labs  Lab 09/17/21 0426  CHOL 126  TRIG 128  HDL 43  CHOLHDL 2.9  VLDL 26  LDLCALC 57    HgbA1c: No results for input(s): HGBA1C in the last 168 hours. Urine Drug Screen:  Recent Labs  Lab 09/16/21 1423  LABOPIA NONE DETECTED  COCAINSCRNUR NONE DETECTED  LABBENZ NONE DETECTED  AMPHETMU NONE DETECTED  THCU NONE DETECTED  LABBARB NONE DETECTED     Alcohol Level  Recent Labs  Lab 09/16/21 1423  ETH <10     IMAGING past 24 hours ECHOCARDIOGRAM COMPLETE  Result Date: 09/17/2021    ECHOCARDIOGRAM REPORT   Patient Name:   Jade [name] Date of Exam: 09/17/2021 Medical Rec #:  [id number]         Height:       66.0 in Accession #:    [id number]        Weight:       160.1 lb Date of Birth:  1956/05/05         BSA:          1.819  m Patient Age:    66 years          BP:           149/79 mmHg Patient Gender: F                 HR:           93 bpm. Exam Location:  Inpatient Procedure: 2D Echo, Cardiac Doppler and Color Doppler Indications:    Stroke I63.9  History:        Patient has prior history of Echocardiogram examinations, most                 recent 06/19/2021. Stroke; Risk Factors:Hypertension and                 Dyslipidemia.  Sonographer:    Merrie Roof RDCS Referring Phys: [place]  1. Left ventricular ejection fraction, by estimation, is 60 to 65%. The left ventricle has normal function. The left ventricle has no regional wall motion abnormalities. There is mild left ventricular hypertrophy. Left ventricular diastolic parameters were normal.  2. Right ventricular systolic function is normal. The right ventricular size is normal. There is normal pulmonary artery systolic pressure. The estimated right ventricular systolic pressure is 123456 mmHg.  3. The mitral valve is normal in structure. No evidence of mitral valve regurgitation.  4. The aortic valve was not  well visualized. Aortic valve regurgitation is not visualized. No aortic stenosis is present. FINDINGS  Left Ventricle: Left ventricular ejection fraction, by estimation, is 60 to 65%. The left ventricle has normal function. The left ventricle has no regional wall motion abnormalities. The left ventricular internal cavity size was normal in size. There is  mild left ventricular hypertrophy. Left ventricular diastolic parameters were normal. Right Ventricle: The right ventricular size is normal. No increase in right ventricular wall thickness. Right ventricular systolic function is normal. There is normal pulmonary artery systolic pressure. The tricuspid regurgitant velocity is 2.45 m/s, and  with an assumed right atrial pressure of 3 mmHg, the estimated right ventricular systolic pressure is 123456 mmHg. Left Atrium: Left atrial size was normal in size. Right Atrium: Right atrial size was normal in size. Pericardium: There is no evidence of pericardial effusion. Mitral Valve: The mitral valve is normal in structure. Mild to moderate mitral annular calcification. No evidence of mitral valve regurgitation. Tricuspid Valve: The tricuspid valve is normal in structure. Tricuspid valve regurgitation is trivial. Aortic Valve: The aortic valve was not well visualized. Aortic valve regurgitation is not visualized. No aortic stenosis is present. Aortic valve mean gradient measures 7.0 mmHg. Aortic valve peak gradient measures 13.0 mmHg. Aortic valve area, by VTI measures 1.88 cm. Pulmonic Valve: The pulmonic valve was not well visualized. Pulmonic valve regurgitation is not visualized. Aorta: The aortic root and ascending aorta are structurally normal, with no evidence of dilitation. IAS/Shunts: The interatrial septum was not well visualized.  LEFT VENTRICLE PLAX 2D LVIDd:         4.10 cm   Diastology LVIDs:         2.70 cm   LV e' medial:    9.79 cm/s LV PW:         1.00 cm   LV E/e' medial:  11.4 LV IVS:        0.90 cm   LV  e' lateral:   8.59 cm/s LVOT diam:     1.90 cm   LV E/e' lateral: 13.0 LV SV:  28 LV SV Index:   32 LVOT Area:     2.84 cm  RIGHT VENTRICLE RV Basal diam:  3.40 cm LEFT ATRIUM             Index        RIGHT ATRIUM           Index LA diam:        4.00 cm 2.20 cm/m   RA Area:     14.80 cm LA Vol (A2C):   63.1 ml 34.69 ml/m  RA Volume:   33.10 ml  18.20 ml/m LA Vol (A4C):   38.8 ml 21.33 ml/m LA Biplane Vol: 50.5 ml 27.76 ml/m  AORTIC VALVE AV Area (Vmax):    1.91 cm AV Area (Vmean):   1.93 cm AV Area (VTI):     1.88 cm AV Vmax:           180.00 cm/s AV Vmean:          123.000 cm/s AV VTI:            0.314 m AV Peak Grad:      13.0 mmHg AV Mean Grad:      7.0 mmHg LVOT Vmax:         121.00 cm/s LVOT Vmean:        83.800 cm/s LVOT VTI:          0.208 m LVOT/AV VTI ratio: 0.66  AORTA Ao Root diam: 3.10 cm Ao Asc diam:  3.50 cm MITRAL VALVE                TRICUSPID VALVE MV Area (PHT): 4.60 cm     TR Peak grad:   24.0 mmHg MV Decel Time: 165 msec     TR Vmax:        245.00 cm/s MV E velocity: 112.00 cm/s MV A velocity: 140.00 cm/s  SHUNTS MV E/A ratio:  0.80         Systemic VTI:  0.21 m                             Systemic Diam: 1.90 cm Oswaldo Milian MD Electronically signed by Oswaldo Milian MD Signature Date/Time: 09/17/2021/3:25:22 PM    Final     PHYSICAL EXAM  Temp:  [98.2 F (36.8 C)-101 F (38.3 C)] 98.6 F (37 C) (01/23 0754) Pulse Rate:  [92-102] 94 (01/23 0754) Resp:  [14-23] 14 (01/23 0754) BP: (136-168)/(66-92) 155/77 (01/23 0754) SpO2:  [95 %-100 %] 97 % (01/23 0754)  General - ill-appearing, fatigued caucasian female but in no acute distress.  Cardiovascular - Regular rhythm and rate.  Mental Status -  Level of arousal and orientation to time, place, and person were intact. Language including expression, naming, repetition, comprehension was assessed and found intact. Attention span and concentration were normal. Recent and remote memory were intact. Fund  of Knowledge was assessed and was intact.  Cranial Nerves II - XII - II - Left hemianopia. III, IV, VI - Extraocular movements intact. V - Facial sensation intact bilaterally. VII - Facial movement intact bilaterally. VIII - Hearing & vestibular intact bilaterally. X - Palate elevates symmetrically. XI - Chin turning & shoulder shrug intact bilaterally. XII - Tongue protrusion intact.  Motor Strength - RUE and RLE drift and weakness (residual from previous strokes) compared to left   Motor Tone - Muscle tone was assessed at the neck and appendages and was normal.  Reflexes - The patients reflexes were symmetrical in all extremities and she had no pathological reflexes.  Sensory - Light touch, temperature/pinprick were assessed and were symmetrical.    Coordination - The patient had normal movements in the hands and feet with no ataxia or dysmetria.  Tremor was absent.  Gait and Station - deferred.  ASSESSMENT/PLAN [name] is a 66 y.o. female with history of HTN, T2DM, CVD, multiple strokes since July 2022, HLD, L carotid stenosis s/p stent presenting with altered mental status and blurry vision.   Stroke:  Multiple small infarcts involving right MCA, left PCA and b/l MCA/PCA territories likely secondary to intracranial stenosis in the setting of severe anemia CTA head & neck Stable with no evidence of LVO or emergent findings, advanced intracranial atherosclerotic disease, L stable patent carotid stent, R ICA <50% stenosis MRI  Patchy acute ischemic infarcts involving the right parieto-occipital region, additional small volume acute to early subacute ischemic infarcts involving the posterior left splenium and adjacent subcortical left occipital lobe, underlying age-related cerebral atrophy with advanced chronic microvascular ischemic disease 2D Echo EF 60-65%  LDL 57 HgbA1c 5.7 VTE prophylaxis - SCD    Diet   Diet Heart Room service appropriate? Yes; Fluid  consistency: Thin   aspirin 81 mg daily and clopidogrel 75 mg daily prior to admission, now on No antithrombotic. Patient not on antithrombotic due to extremely low hemoglobin. When Hgb stabilized and improved, plan to start ASA alone Therapy recommendations:  SNF Disposition:  pending  Hypertension Home meds:  norvasc Stable Permissive hypertension (OK if < 220/120) but gradually normalize in 5-7 days Long-term BP goal normotensive  Hyperlipidemia Home meds:  lipitor 80, resumed in hospital LDL 57, goal < 70 On lipitor 80 mg  Continue statin at discharge  Diabetes type II Controlled Home meds: none HgbA1c 5.7, goal < 7.0 CBGs No results for input(s): GLUCAP in the last 72 hours.  SSI  Other Stroke Risk Factors Advanced Age >/= 50  Former Cigarette smoker Hx stroke/TIA  Other Active Problems Acute anemia  Hospital day # 2  France Ravens, MD PGY1 Resident  To contact Stroke Continuity provider, please refer to [email or web address] After hours, contact General Neurology

## 2021-09-19 DIAGNOSIS — K921 Melena: Secondary | ICD-10-CM

## 2021-09-19 DIAGNOSIS — D500 Iron deficiency anemia secondary to blood loss (chronic): Secondary | ICD-10-CM

## 2021-09-19 LAB — CBC
HCT: 25.6 % — ABNORMAL LOW (ref 36.0–46.0)
HCT: 28.5 % — ABNORMAL LOW (ref 36.0–46.0)
Hemoglobin: 8.6 g/dL — ABNORMAL LOW (ref 12.0–15.0)
Hemoglobin: 9.4 g/dL — ABNORMAL LOW (ref 12.0–15.0)
MCH: 30.6 pg (ref 26.0–34.0)
MCH: 30.7 pg (ref 26.0–34.0)
MCHC: 33.6 g/dL (ref 30.0–36.0)
MCHC: 33 g/dL (ref 30.0–36.0)
MCV: 91.1 fL (ref 80.0–100.0)
MCV: 93.1 fL (ref 80.0–100.0)
Platelets: 437 10*3/uL — ABNORMAL HIGH (ref 150–400)
Platelets: 456 10*3/uL — ABNORMAL HIGH (ref 150–400)
RBC: 2.81 MIL/uL — ABNORMAL LOW (ref 3.87–5.11)
RBC: 3.06 MIL/uL — ABNORMAL LOW (ref 3.87–5.11)
RDW: 14.7 % (ref 11.5–15.5)
RDW: 14.8 % (ref 11.5–15.5)
WBC: 10.4 10*3/uL (ref 4.0–10.5)
WBC: 10.2 10*3/uL (ref 4.0–10.5)
nRBC: 0 % (ref 0.0–0.2)
nRBC: 0 % (ref 0.0–0.2)

## 2021-09-19 LAB — BASIC METABOLIC PANEL
Anion gap: 8 (ref 5–15)
BUN: 10 mg/dL (ref 8–23)
CO2: 21 mmol/L — ABNORMAL LOW (ref 22–32)
Calcium: 8.7 mg/dL — ABNORMAL LOW (ref 8.9–10.3)
Chloride: 108 mmol/L (ref 98–111)
Creatinine, Ser: 0.84 mg/dL (ref 0.44–1.00)
GFR, Estimated: >60 mL/min (ref >60)
Glucose, Bld: 114 mg/dL — ABNORMAL HIGH (ref 70–99)
Potassium: 3.4 mmol/L — ABNORMAL LOW (ref 3.5–5.1)
Sodium: 137 mmol/L (ref 135–145)

## 2021-09-19 MED ORDER — POTASSIUM CHLORIDE CRYS ER 20 MEQ PO TBCR
40.0000 meq | EXTENDED_RELEASE_TABLET | Freq: Once | ORAL | Status: AC
  Administered 2021-09-19: 14:00:00 40 meq via ORAL
  Filled 2021-09-19: qty 2

## 2021-09-19 NOTE — H&P (View-Only) (Signed)
[facility] Consult: 11:19 AM 09/19/2021  LOS: 3 days    Referring Provider: Dr Wynelle Cleveland.    Primary Care Physician:  Sue Lush, PA-C Primary Gastroenterologist:  Dr Carlean Purl (1 OV w APP 2016)    Reason for Consultation:  FOBT + anemia.   HPI: Jade [name] is a 66 y.o. female.  PMH below.  Hypertension.  Hyperlipidemia.  COPD.  DM2.  Obesity.  Prior, recurrent strokes.  On DAPT (previous Brilinta).  03/2021 left transcarotid artery revascularization.  Ambulates in wheelchair.  Covid 19 (no vax), subacute infarcts in 05/2021.  Residula R sided weakness, R arm pain and spasticity.  Colonoscopy in [facility]  ~2008 normal per her report, GI unable to find reports. Prior GI issues include constipation, chronic abdominal pain, evaluated at GI office 2016.  Dr Charlestine Massed APP recommended repeat colonoscopy in 2018.  CT then showed gallbladder fossa calcification, question stone in cystic duct remnant.  Smaller, stable gallbladder fossa calcifications, CBD not dilated, prominent liver, diffuse stool throughout colon, changes of ventral hernia repair without recurrent hernia (pt denies previous hernia repair).  Pt never returned to [facility].  Not on PPI.  Presented to the ED 4 d ago with right-sided deficits, blurry vision, weakness, headaches.  Ruled in for watershed infarcts though has been compliant with Plavix, 81 ASA.  CNS vascular imaging confirms advanced intracranial atherosclerosis, patent left carotid stent, stable less than 50% right ICA stenosis.  Neurology would like to restart aspirin (but not Plavix) "okay to hold off antiplatelet for now, however once Hgb stable and improved, and GI clears, recommend aspirin 81 for stroke prevention" but no ASA currently due to anemia and FOBT positive stool.   At  arrival Hgb 5.3.Marland Kitchen 2 PRBC .. 10.6... 8.6.  Hgb ~ 11  to 14 three to four m ago, 15. 4 one month ago.  MCV 90.  Platelets 400s to 500s.  INR 1.   FOBT+.  No evidence of B12, folate or iron deficiency.   Potassium 3.1.  No renal insufficiency.    09/16/2021 CTAP with contrast shows no RP hemorrhage, no intra-abdominal pathology.  Gallbladder surgically absent with stable gallbladder fossa calcifications.  Biliary tree nondilated.  Pancreas, spleen, stomach, bowel unremarkable.  Consultation, evaluation by hematology, Dr. Chryl Heck, who raises question of bone marrow suppression from Dantrium so this has been discontinued.  Recommends GI evaluation.  Will consider bone marrow aspiration/biopsy.  Pt denies nausea, vomiting, abdominal pain.  Has not had problems with constipation lately.  Appetite depressed but no dysphagia.  No heartburn, no regurgitation.  No excessive or unusual bleeding or bruising.  No nosebleeds.  No NSAIDs though she is not aware of exactly what medications she takes.    Family history: Heart disease and leukemia in her father.  No history colon cancer or esophageal cancer. Social history.  Divorced.  Lives at home in [place] w ex-husband.  Patient's son lives in a bus on the property and provides assistance.   Past Medical History:  Diagnosis Date   Arthritis    "  back, knees, some in my left hip" (03/21/2018)   Chronic kidney disease    "was told I had 25% kidney function in early 2012" (03/21/2018)   COPD (chronic obstructive pulmonary disease) (HCC)    CVA (cerebral vascular accident) ([place]) 03/21/2018   "numb all over; head to toe" (03/21/2018)   Fibromyalgia    History of gout    Hyperlipidemia    Hypertension    Migraine    "used to get them monthly during menopause" (03/21/2018)   Neuromuscular disorder ([place])    OSA (obstructive sleep apnea)    "don't tolerate the machine" (03/21/2018)   Pneumonia ~ 2007   Type 2 diabetes, diet controlled ([place])     Past Surgical  History:  Procedure Laterality Date   HERNIA REPAIR     LAPAROSCOPIC CHOLECYSTECTOMY  06/2007   "no UHR w/this" (03/21/2018)   TONSILLECTOMY     TRANSCAROTID ARTERY REVASCULARIZATION  Left 04/06/2021   Procedure: LEFT TRANSCAROTID ARTERY REVASCULARIZATION;  Surgeon: Cherre Robins, MD;  Location: [facility];  Service: Vascular;  Laterality: Left;   ULTRASOUND GUIDANCE FOR VASCULAR ACCESS Right 04/06/2021   Procedure: ULTRASOUND GUIDANCE FOR VASCULAR ACCESS;  Surgeon: Cherre Robins, MD;  Location: [facility];  Service: Vascular;  Laterality: Right;   [place]  2009    Prior to Admission medications   Medication Sig Start Date End Date Taking? Authorizing Provider  acetaminophen (TYLENOL) 500 MG tablet Take 1,000 mg by mouth at bedtime.   Yes [provider]  amLODipine (NORVASC) 10 MG tablet Take 10 mg by mouth daily. 08/29/21  Yes [provider]  aspirin EC 81 MG tablet Take 81 mg by mouth daily. Swallow whole.   Yes [provider]  atorvastatin (LIPITOR) 80 MG tablet Take 80 mg by mouth daily.   Yes [provider]  B Complex-C (B-COMPLEX WITH VITAMIN C) tablet Take 1 tablet by mouth daily.   Yes [provider]  cholecalciferol (VITAMIN D3) 25 MCG (1000 UNIT) tablet Take 1,000 Units by mouth daily.   Yes [provider]  clopidogrel (PLAVIX) 75 MG tablet Start taking after Brilinta is complete - Take 1 tablet (75 mg total) by mouth every morning. Patient taking differently: Take 75 mg by mouth daily. 07/23/21 10/21/21 Yes Demaio, Alexa, MD  dantrolene (DANTRIUM) 50 MG capsule Take 1 capsule (50 mg total) by mouth at bedtime. 08/11/21  Yes   loperamide (IMODIUM) 2 MG capsule Take 2 capsules (4 mg total) by mouth 2 (two) times daily as needed for diarrhea or loose stools. 06/30/21  Yes Demaio, Alexa, MD  Multiple Vitamins-Minerals (MULTIVITAMIN ADULTS 50+ PO) Take 1 tablet by mouth daily.   Yes [provider]  Multiple  Vitamins-Minerals (ZINC PO) Take 1 tablet by mouth daily.   Yes [provider]  tiZANidine (ZANAFLEX) 2 MG tablet Take 2 mg by mouth at bedtime.   Yes [provider]  atorvastatin (LIPITOR) 80 MG tablet Take 1 tablet (80 mg total) by mouth daily. 06/14/21 07/14/21  Lucrezia Starch, MD    Scheduled Meds:  sodium chloride   Intravenous Once   sodium chloride   Intravenous Once   amLODipine  10 mg Oral Daily   atorvastatin  80 mg Oral Daily   tiZANidine  2 mg Oral QHS   Infusions:  PRN Meds: acetaminophen **OR** acetaminophen (TYLENOL) oral liquid 160 mg/5 mL **OR** acetaminophen   Allergies as of 09/16/2021 - Review Complete 09/16/2021  Allergen Reaction Noted  Baclofen Other (See Comments) 04/23/2021   Nickel Dermatitis and Rash 03/21/2018    Family History  Problem Relation Age of Onset   Heart disease Father    Leukemia Father    Alcohol abuse Son    Other Mother    Cancer Neg Hx    Diabetes Neg Hx    Hearing loss Neg Hx    Hyperlipidemia Neg Hx    Hypertension Neg Hx    Kidney disease Neg Hx    Stroke Neg Hx     Social History   Socioeconomic History   Marital status: Married    Spouse name: Jose   Number of children: 1   Years of education: Not on file   Highest education level: Not on file  Occupational History   Occupation: Disability  Tobacco Use   Smoking status: Former    Packs/day: 1.25    Years: 16.00    Pack years: 20.00    Types: Cigarettes    Quit date: 08/28/1987    Years since quitting: 34.0   Smokeless tobacco: Never  Vaping Use   Vaping Use: Never used  Substance and Sexual Activity   Alcohol use: Not Currently   Drug use: Not Currently    Types: Marijuana   Sexual activity: Not Currently    Birth control/protection: Post-menopausal  Other Topics Concern   Not on file  Social History Narrative   Not on file   Social Determinants of Health   Financial Resource Strain: Not on file  Food Insecurity: Not on  file  Transportation Needs: Not on file  Physical Activity: Not on file  Stress: Not on file  Social Connections: Not on file  Intimate Partner Violence: Not on file    REVIEW OF SYSTEMS: Constitutional: Denies weakness or fatigue. ENT:  No nose bleeds Pulm: Denies cough or shortness of breath. CV:  No palpitations, no LE edema.  No chest pain/angina. GU:  No hematuria, no frequency GI: Per HPI. Heme: Per HPI. Transfusions: Patient has no recall and there is no records of previous PRBCs. Neuro:  No headaches, no peripheral tingling or numbness.  Right upper extremity weakness and spasticity. Derm:  No itching, no rash or sores.  Endocrine:  No sweats or chills.  No polyuria or dysuria Immunization: Reviewed. Travel:  None    PHYSICAL EXAM: Vital signs in last 24 hours: Vitals:   09/19/21 0410 09/19/21 0816  BP: (!) 149/70 (!) 167/73  Pulse:  88  Resp: 15 14  Temp: 98.3 F (36.8 C) 98.5 F (36.9 C)  SpO2: 100% 99%   Wt Readings from Last 3 Encounters:  06/18/21 72.6 kg  06/14/21 76.2 kg  04/13/21 76.2 kg    General: Patient is pale and looks somewhat chronically ill but resting comfortably in bed and able to hold a conversation. Head: No facial asymmetry or swelling.  No signs of head trauma. Eyes: Conjunctiva slightly pale.  No scleral icterus. Ears: No obvious hearing deficits. Nose: No discharge or congestion Mouth: Fair dentition with missing teeth.  Tongue midline.  Mucosa is moist, pink, clear. Neck: No thyromegaly, masses or JVD. Lungs: Clear bilaterally.  No labored breathing or cough. Heart: RRR.  No MRG.  S1, S2 present Abdomen: Soft without distention or tenderness.  Active bowel sounds.  No organomegaly, bruits, hernias.   Rectal: Deferred Musc/Skeltl: No joint redness, swelling.  Somewhat contracted right upper extremity and fist. Extremities: No CCE. Neurologic: Strength grossly normal on LUE, LLE, RLE.  Weakness  in [place].  No tremors or spasticity  observed.  She is oriented to "hospital" but thinking the hospital is in [facility].  Says the year is 2020.  Knows her name and that she lives in [place].  I corrected and reoriented her to 2023 and being at hospital in [place]. Skin: No telangiectasia, no significant purpura or bruising.  No suspicious lesions. Nodes: No cervical adenopathy Psych: Calm, cooperative, pleasant.  Slightly flat affect.  Intake/Output from previous day: 01/23 0701 - 01/24 0700 In: 90 [P.O.:90] Out: 800 [Urine:800] Intake/Output this shift: No intake/output data recorded.  LAB RESULTS: Recent Labs    09/18/21 0731 09/18/21 1615 09/19/21 0400  WBC 10.2 11.9* 10.4  HGB 10.6* 9.1* 8.6*  HCT 30.7* 26.6* 25.6*  PLT 524* 467* 437*   BMET Lab Results  Component Value Date   NA 137 09/19/2021   NA 141 09/18/2021   NA 138 09/16/2021   K 3.4 (L) 09/19/2021   K 3.1 (L) 09/18/2021   K 3.5 09/16/2021   CL 108 09/19/2021   CL 110 09/18/2021   CL 107 09/16/2021   CO2 21 (L) 09/19/2021   CO2 23 09/18/2021   CO2 23 09/16/2021   GLUCOSE 114 (H) 09/19/2021   GLUCOSE 152 (H) 09/18/2021   GLUCOSE 155 (H) 09/16/2021   BUN 10 09/19/2021   BUN 10 09/18/2021   BUN 21 09/16/2021   CREATININE 0.84 09/19/2021   CREATININE 0.76 09/18/2021   CREATININE 0.80 09/16/2021   CALCIUM 8.7 (L) 09/19/2021   CALCIUM 9.1 09/18/2021   CALCIUM 8.7 (L) 09/16/2021   LFT Recent Labs    09/16/21 1423  PROT 5.5*  ALBUMIN 3.2*  AST 17  ALT 13  ALKPHOS 57  BILITOT 0.3   PT/INR Lab Results  Component Value Date   INR 1.0 09/16/2021   INR 1.0 04/01/2021   INR 0.90 03/21/2018   Hepatitis Panel Recent Labs    09/17/21 2022  HEPBSAG NON REACTIVE  HCVAB NON REACTIVE  HEPAIGM NON REACTIVE  HEPBIGM NON REACTIVE   C-Diff No components found for: CDIFF Lipase     Component Value Date/Time   LIPASE 110.0 (H) 11/03/2014 1208    Drugs of Abuse     Component Value Date/Time   LABOPIA NONE DETECTED  09/16/2021 1423   COCAINSCRNUR NONE DETECTED 09/16/2021 1423   LABBENZ NONE DETECTED 09/16/2021 1423   AMPHETMU NONE DETECTED 09/16/2021 1423   THCU NONE DETECTED 09/16/2021 1423   LABBARB NONE DETECTED 09/16/2021 1423     RADIOLOGY STUDIES: ECHOCARDIOGRAM COMPLETE  Result Date: 09/17/2021 IMPRESSIONS  1. Left ventricular ejection fraction, by estimation, is 60 to 65%. The left ventricle has normal function. The left ventricle has no regional wall motion abnormalities. There is mild left ventricular hypertrophy. Left ventricular diastolic parameters were normal.  2. Right ventricular systolic function is normal. The right ventricular size is normal. There is normal pulmonary artery systolic pressure. The estimated right ventricular systolic pressure is 123456 mmHg.  3. The mitral valve is normal in structure. No evidence of mitral valve regurgitation.  4. The aortic valve was not well visualized. Aortic valve regurgitation is not visualized. No aortic stenosis is present. Electronically signed by Oswaldo Milian MD Signature Date/Time: 09/17/2021/3:25:22 PM    Final       IMPRESSION:    FOBT + anemia w/O melena or BPR.  Rule out ulcers, AVMs, neoplasia.  Possible BM suppression from Dantrium, now dc'd.    Significant history of strokes.  Now  admitted with watershed infarcts despite compliance with chronic Plavix, aspirin.  Currently off both of these medications.    PLAN:     Discussed possibility of pursuing EGD, colonoscopy with pt.  She understands the procedures but stated "I do not think that is necessary".    Dr. Tarri Glenn to follow and perhaps can convince her otherwise or we could discuss with family.  Discharge plan is for SNF admission.  Patient is DNR.   Azucena Freed  09/19/2021, 11:19 AM Phone [phone]

## 2021-09-19 NOTE — Plan of Care (Signed)

## 2021-09-19 NOTE — Progress Notes (Signed)
Chart and lab work have been reviewed.  Hemoglobin drifting down is down to 8.6 today.  Work-up did not show any evidence of hemolysis.  Now noted to have heme positive stools.  Would recommend GI evaluation.  Hematology will sign off at this time.  No outpatient follow-up has been scheduled.  Please reconsult if needed.  Clenton Pare, DNP, AGPCNP-BC, AOCNP

## 2021-09-19 NOTE — TOC Progression Note (Signed)
Transition of Care [facility]) - Progression Note    Patient Details  Name: Jade [name] MRN: [id number] Date of Birth: January 30, 1956  [facility] CM/SW Contact  Kermit Balo, RN Phone Number: 09/19/2021, 12:17 PM  Clinical Narrative:    No response from son on SNF choice. CM has called and left voicemail for son. TOC following.    Expected Discharge Plan: Skilled Nursing Facility Barriers to Discharge: Continued Medical Work up  Expected Discharge Plan and Services Expected Discharge Plan: Skilled Nursing Facility In-house Referral: Clinical Social Work Discharge Planning Services: CM Consult Post Acute Care Choice: Skilled Nursing Facility Living arrangements for the past 2 months: Single Family Home                                       Social Determinants of Health (SDOH) Interventions    Readmission Risk Interventions Readmission Risk Prevention Plan 06/30/2021  Transportation Screening Complete  PCP or Specialist Appt within 3-5 Days Complete  HRI or Home Care Consult Complete  Social Work Consult for Recovery Care Planning/Counseling Complete  Palliative Care Screening Not Applicable  Some recent data might be hidden

## 2021-09-19 NOTE — Consult Note (Addendum)
[facility] Consult: 11:19 AM 09/19/2021  LOS: 3 days    Referring Provider: Dr Wynelle Cleveland.    Primary Care Physician:  Sue Lush, PA-C Primary Gastroenterologist:  Dr Carlean Purl (1 OV w APP 2016)    Reason for Consultation:  FOBT + anemia.   HPI: Jade [name] is a 66 y.o. female.  PMH below.  Hypertension.  Hyperlipidemia.  COPD.  DM2.  Obesity.  Prior, recurrent strokes.  On DAPT (previous Brilinta).  03/2021 left transcarotid artery revascularization.  Ambulates in wheelchair.  Covid 19 (no vax), subacute infarcts in 05/2021.  Residula R sided weakness, R arm pain and spasticity.  Colonoscopy in [facility]  ~2008 normal per her report, GI unable to find reports. Prior GI issues include constipation, chronic abdominal pain, evaluated at GI office 2016.  Dr Charlestine Massed APP recommended repeat colonoscopy in 2018.  CT then showed gallbladder fossa calcification, question stone in cystic duct remnant.  Smaller, stable gallbladder fossa calcifications, CBD not dilated, prominent liver, diffuse stool throughout colon, changes of ventral hernia repair without recurrent hernia (pt denies previous hernia repair).  Pt never returned to [facility].  Not on PPI.  Presented to the ED 4 d ago with right-sided deficits, blurry vision, weakness, headaches.  Ruled in for watershed infarcts though has been compliant with Plavix, 81 ASA.  CNS vascular imaging confirms advanced intracranial atherosclerosis, patent left carotid stent, stable less than 50% right ICA stenosis.  Neurology would like to restart aspirin (but not Plavix) "okay to hold off antiplatelet for now, however once Hgb stable and improved, and GI clears, recommend aspirin 81 for stroke prevention" but no ASA currently due to anemia and FOBT positive stool.   At  arrival Hgb 5.3.Marland Kitchen 2 PRBC .. 10.6... 8.6.  Hgb ~ 11  to 14 three to four m ago, 15. 4 one month ago.  MCV 90.  Platelets 400s to 500s.  INR 1.   FOBT+.  No evidence of B12, folate or iron deficiency.   Potassium 3.1.  No renal insufficiency.    09/16/2021 CTAP with contrast shows no RP hemorrhage, no intra-abdominal pathology.  Gallbladder surgically absent with stable gallbladder fossa calcifications.  Biliary tree nondilated.  Pancreas, spleen, stomach, bowel unremarkable.  Consultation, evaluation by hematology, Dr. Chryl Heck, who raises question of bone marrow suppression from Dantrium so this has been discontinued.  Recommends GI evaluation.  Will consider bone marrow aspiration/biopsy.  Pt denies nausea, vomiting, abdominal pain.  Has not had problems with constipation lately.  Appetite depressed but no dysphagia.  No heartburn, no regurgitation.  No excessive or unusual bleeding or bruising.  No nosebleeds.  No NSAIDs though she is not aware of exactly what medications she takes.    Family history: Heart disease and leukemia in her father.  No history colon cancer or esophageal cancer. Social history.  Divorced.  Lives at home in [place] w ex-husband.  Patient's son lives in a bus on the property and provides assistance.   Past Medical History:  Diagnosis Date   Arthritis    "  back, knees, some in my left hip" (03/21/2018)  ° Chronic kidney disease   ° "was told I had 25% kidney function in early 2012" (03/21/2018)  ° COPD (chronic obstructive pulmonary disease) (HCC)   ° CVA (cerebral vascular accident) (HCC) 03/21/2018  ° "numb all over; head to toe" (03/21/2018)  ° Fibromyalgia   ° History of gout   ° Hyperlipidemia   ° Hypertension   ° Migraine   ° "used to get them monthly during menopause" (03/21/2018)  ° Neuromuscular disorder (HCC)   ° OSA (obstructive sleep apnea)   ° "don't tolerate the machine" (03/21/2018)  ° Pneumonia ~ 2007  ° Type 2 diabetes, diet controlled (HCC)   ° ° °Past Surgical  History:  °Procedure Laterality Date  ° HERNIA REPAIR    ° LAPAROSCOPIC CHOLECYSTECTOMY  06/2007  ° "no UHR w/this" (03/21/2018)  ° TONSILLECTOMY    ° TRANSCAROTID ARTERY REVASCULARIZATIONÂ  Left 04/06/2021  ° Procedure: LEFT TRANSCAROTID ARTERY REVASCULARIZATION;  Surgeon: Hawken, Thomas N, MD;  Location: [facility] OR;  Service: Vascular;  Laterality: Left;  ° ULTRASOUND GUIDANCE FOR VASCULAR ACCESS Right 04/06/2021  ° Procedure: ULTRASOUND GUIDANCE FOR VASCULAR ACCESS;  Surgeon: Hawken, Thomas N, MD;  Location: [facility] OR;  Service: Vascular;  Laterality: Right;  ° UMBILICAL HERNIA REPAIR  2009  ° ° °Prior to Admission medications   °Medication Sig Start Date End Date Taking? Authorizing Provider  °acetaminophen (TYLENOL) 500 MG tablet Take 1,000 mg by mouth at bedtime.   Yes [provider]  °amLODipine (NORVASC) 10 MG tablet Take 10 mg by mouth daily. 08/29/21  Yes [provider]  °aspirin EC 81 MG tablet Take 81 mg by mouth daily. Swallow whole.   Yes [provider]  °atorvastatin (LIPITOR) 80 MG tablet Take 80 mg by mouth daily.   Yes [provider]  °B Complex-C (B-COMPLEX WITH VITAMIN C) tablet Take 1 tablet by mouth daily.   Yes [provider]  °cholecalciferol (VITAMIN D3) 25 MCG (1000 UNIT) tablet Take 1,000 Units by mouth daily.   Yes [provider]  °clopidogrel (PLAVIX) 75 MG tablet Start taking after Brilinta is complete - Take 1 tablet (75 mg total) by mouth every morning. °Patient taking differently: Take 75 mg by mouth daily. 07/23/21 10/21/21 Yes Demaio, Alexa, MD  °dantrolene (DANTRIUM) 50 MG capsule Take 1 capsule (50 mg total) by mouth at bedtime. 08/11/21  Yes   °loperamide (IMODIUM) 2 MG capsule Take 2 capsules (4 mg total) by mouth 2 (two) times daily as needed for diarrhea or loose stools. 06/30/21  Yes Demaio, Alexa, MD  °Multiple Vitamins-Minerals (MULTIVITAMIN ADULTS 50+ PO) Take 1 tablet by mouth daily.   Yes [provider]  °Multiple  Vitamins-Minerals (ZINC PO) Take 1 tablet by mouth daily.   Yes [provider]  °tiZANidine (ZANAFLEX) 2 MG tablet Take 2 mg by mouth at bedtime.   Yes [provider]  °atorvastatin (LIPITOR) 80 MG tablet Take 1 tablet (80 mg total) by mouth daily. 06/14/21 07/14/21  Dykstra, Richard S, MD  ° ° °Scheduled Meds: ° sodium chloride   Intravenous Once  ° sodium chloride   Intravenous Once  ° amLODipine  10 mg Oral Daily  ° atorvastatin  80 mg Oral Daily  ° tiZANidine  2 mg Oral QHS  ° °Infusions: ° °PRN Meds: °acetaminophen **OR** acetaminophen (TYLENOL) oral liquid 160 mg/5 mL **OR** acetaminophen ° ° °Allergies as of 09/16/2021 - Review Complete 09/16/2021  °Allergen Reaction Noted  °   Baclofen Other (See Comments) 04/23/2021   Nickel Dermatitis and Rash 03/21/2018    Family History  Problem Relation Age of Onset   Heart disease Father    Leukemia Father    Alcohol abuse Son    Other Mother    Cancer Neg Hx    Diabetes Neg Hx    Hearing loss Neg Hx    Hyperlipidemia Neg Hx    Hypertension Neg Hx    Kidney disease Neg Hx    Stroke Neg Hx     Social History   Socioeconomic History   Marital status: Married    Spouse name: Jose   Number of children: 1   Years of education: Not on file   Highest education level: Not on file  Occupational History   Occupation: Disability  Tobacco Use   Smoking status: Former    Packs/day: 1.25    Years: 16.00    Pack years: 20.00    Types: Cigarettes    Quit date: 08/28/1987    Years since quitting: 34.0   Smokeless tobacco: Never  Vaping Use   Vaping Use: Never used  Substance and Sexual Activity   Alcohol use: Not Currently   Drug use: Not Currently    Types: Marijuana   Sexual activity: Not Currently    Birth control/protection: Post-menopausal  Other Topics Concern   Not on file  Social History Narrative   Not on file   Social Determinants of Health   Financial Resource Strain: Not on file  Food Insecurity: Not on  file  Transportation Needs: Not on file  Physical Activity: Not on file  Stress: Not on file  Social Connections: Not on file  Intimate Partner Violence: Not on file    REVIEW OF SYSTEMS: Constitutional: Denies weakness or fatigue. ENT:  No nose bleeds Pulm: Denies cough or shortness of breath. CV:  No palpitations, no LE edema.  No chest pain/angina. GU:  No hematuria, no frequency GI: Per HPI. Heme: Per HPI. Transfusions: Patient has no recall and there is no records of previous PRBCs. Neuro:  No headaches, no peripheral tingling or numbness.  Right upper extremity weakness and spasticity. Derm:  No itching, no rash or sores.  Endocrine:  No sweats or chills.  No polyuria or dysuria Immunization: Reviewed. Travel:  None    PHYSICAL EXAM: Vital signs in last 24 hours: Vitals:   09/19/21 0410 09/19/21 0816  BP: (!) 149/70 (!) 167/73  Pulse:  88  Resp: 15 14  Temp: 98.3 F (36.8 C) 98.5 F (36.9 C)  SpO2: 100% 99%   Wt Readings from Last 3 Encounters:  06/18/21 72.6 kg  06/14/21 76.2 kg  04/13/21 76.2 kg    General: Patient is pale and looks somewhat chronically ill but resting comfortably in bed and able to hold a conversation. Head: No facial asymmetry or swelling.  No signs of head trauma. Eyes: Conjunctiva slightly pale.  No scleral icterus. Ears: No obvious hearing deficits. Nose: No discharge or congestion Mouth: Fair dentition with missing teeth.  Tongue midline.  Mucosa is moist, pink, clear. Neck: No thyromegaly, masses or JVD. Lungs: Clear bilaterally.  No labored breathing or cough. Heart: RRR.  No MRG.  S1, S2 present Abdomen: Soft without distention or tenderness.  Active bowel sounds.  No organomegaly, bruits, hernias.   Rectal: Deferred Musc/Skeltl: No joint redness, swelling.  Somewhat contracted right upper extremity and fist. Extremities: No CCE. Neurologic: Strength grossly normal on LUE, LLE, RLE.  Weakness  in [place].  No tremors or spasticity  observed.  She is oriented to "hospital" but thinking the hospital is in [facility].  Says the year is 2020.  Knows her name and that she lives in [place].  I corrected and reoriented her to 2023 and being at hospital in [place]. Skin: No telangiectasia, no significant purpura or bruising.  No suspicious lesions. Nodes: No cervical adenopathy Psych: Calm, cooperative, pleasant.  Slightly flat affect.  Intake/Output from previous day: 01/23 0701 - 01/24 0700 In: 90 [P.O.:90] Out: 800 [Urine:800] Intake/Output this shift: No intake/output data recorded.  LAB RESULTS: Recent Labs    09/18/21 0731 09/18/21 1615 09/19/21 0400  WBC 10.2 11.9* 10.4  HGB 10.6* 9.1* 8.6*  HCT 30.7* 26.6* 25.6*  PLT 524* 467* 437*   BMET Lab Results  Component Value Date   NA 137 09/19/2021   NA 141 09/18/2021   NA 138 09/16/2021   K 3.4 (L) 09/19/2021   K 3.1 (L) 09/18/2021   K 3.5 09/16/2021   CL 108 09/19/2021   CL 110 09/18/2021   CL 107 09/16/2021   CO2 21 (L) 09/19/2021   CO2 23 09/18/2021   CO2 23 09/16/2021   GLUCOSE 114 (H) 09/19/2021   GLUCOSE 152 (H) 09/18/2021   GLUCOSE 155 (H) 09/16/2021   BUN 10 09/19/2021   BUN 10 09/18/2021   BUN 21 09/16/2021   CREATININE 0.84 09/19/2021   CREATININE 0.76 09/18/2021   CREATININE 0.80 09/16/2021   CALCIUM 8.7 (L) 09/19/2021   CALCIUM 9.1 09/18/2021   CALCIUM 8.7 (L) 09/16/2021   LFT Recent Labs    09/16/21 1423  PROT 5.5*  ALBUMIN 3.2*  AST 17  ALT 13  ALKPHOS 57  BILITOT 0.3   PT/INR Lab Results  Component Value Date   INR 1.0 09/16/2021   INR 1.0 04/01/2021   INR 0.90 03/21/2018   Hepatitis Panel Recent Labs    09/17/21 2022  HEPBSAG NON REACTIVE  HCVAB NON REACTIVE  HEPAIGM NON REACTIVE  HEPBIGM NON REACTIVE   C-Diff No components found for: CDIFF Lipase     Component Value Date/Time   LIPASE 110.0 (H) 11/03/2014 1208    Drugs of Abuse     Component Value Date/Time   LABOPIA NONE DETECTED  09/16/2021 1423   COCAINSCRNUR NONE DETECTED 09/16/2021 1423   LABBENZ NONE DETECTED 09/16/2021 1423   AMPHETMU NONE DETECTED 09/16/2021 1423   THCU NONE DETECTED 09/16/2021 1423   LABBARB NONE DETECTED 09/16/2021 1423     RADIOLOGY STUDIES: ECHOCARDIOGRAM COMPLETE  Result Date: 09/17/2021 IMPRESSIONS  1. Left ventricular ejection fraction, by estimation, is 60 to 65%. The left ventricle has normal function. The left ventricle has no regional wall motion abnormalities. There is mild left ventricular hypertrophy. Left ventricular diastolic parameters were normal.  2. Right ventricular systolic function is normal. The right ventricular size is normal. There is normal pulmonary artery systolic pressure. The estimated right ventricular systolic pressure is 123456 mmHg.  3. The mitral valve is normal in structure. No evidence of mitral valve regurgitation.  4. The aortic valve was not well visualized. Aortic valve regurgitation is not visualized. No aortic stenosis is present. Electronically signed by Oswaldo Milian MD Signature Date/Time: 09/17/2021/3:25:22 PM    Final       IMPRESSION:    FOBT + anemia w/O melena or BPR.  Rule out ulcers, AVMs, neoplasia.  Possible BM suppression from Dantrium, now dc'd.    Significant history of strokes.  Now  admitted with watershed infarcts despite compliance with chronic Plavix, aspirin.  Currently off both of these medications.    PLAN:     Discussed possibility of pursuing EGD, colonoscopy with pt.  She understands the procedures but stated "I do not think that is necessary".    Dr. Tarri Glenn to follow and perhaps can convince her otherwise or we could discuss with family.  Discharge plan is for SNF admission.  Patient is DNR.   Azucena Freed  09/19/2021, 11:19 AM Phone [phone]

## 2021-09-19 NOTE — Care Management Important Message (Signed)
Important Message  Patient Details  Name: Jade [name] MRN: [id number] Date of Birth: [birth year]   Medicare Important Message Given:  Yes     Orbie Pyo 09/19/2021, 2:13 PM

## 2021-09-19 NOTE — Progress Notes (Signed)
Upon med pass. This RN noticed a difference in the patients eye contact and vision.  NIH rose 2 points. MD has been paged. Will continue to monitor.

## 2021-09-19 NOTE — Progress Notes (Signed)
PROGRESS NOTE    Jade [name]   VEL:[id number]  DOB: 18-Jun-1956  DOA: 09/16/2021 PCP: Jade Canary, PA-C   Brief Narrative:  Jade [name] is a 66 year old female with a history of CVA, left carotid stenosis status post stent, hypertension who presents to the ED with blurred vision for a few days. MRI revealed multifocal posterior circulation infarcts felt to be embolic per neurology Also noted to have a hemoglobin around 7 which is a major drop from the 12-15 range about 1 month ago.  She initially declined a blood transfusion.   Subjective: She is alert and oriented. She has no new complaints. Agreeing to go to SNF as long as therapy will help her.   Assessment & Plan:   Principal Problem:   Acute arterial ischemic stroke, multifocal, posterior circulation, unspecified laterality (HCC) -In addition to visual disturbances, she tells me that she is had difficulty ambulating at home and has been off balance -per Neuro, possibly watershed infarcts due to significant drop in hemoglobin in the setting of severe stenosis - was discharged home on aspirin and Plavix on 06/30/2021 with a plan to continue both for 2 months and then Plavix alone - cont Lipitor 80 mg which is a home med -At this time both are on hold- heme occult noted to be positive- Neuro would like GI input on a baby aspirin daily   Active Problems:  Acute anemia, normocytic - She is not aware of any bleeding no signs of bleeding in the hospital - Hemoglobin dropped from 7 to 5.3 the next day --not dilutional as she did not receive any IV fluids- initially declined transfusion but later accepted 2 U PRBC - I asked for a hematology opinion > recommended to hold Dantrolene (this was a new med) and has ordered further work up - Hb 8.6 today - heme + but not overt bleeding- I checked with her RN to be sure - GI consulted to clear for ASA - follow heme recommendation and f/u CBC Q 12  Fever - temp 101 on  1/22 - no infectious source- ? Drug reaction to Dantrolene   HTN - cont Amlodipine which is a home med     Wheelchair-bound - Mostly wheelchair-bound- lives with her ex- husband -son lives nearby- PT recommends SNF- patient and husband in agreement  Glucose intolerance - A1c 5.7   DVT prophylaxis: SCDs Code Status: DNR Family Communication: son Level of Care: Level of care: Progressive Disposition Plan:  Status is: Inpatient  Remains inpatient appropriate because: acute anemia with undetermined etiology, CVAs  Consultants:  Neurology Hematology Antimicrobials:  Anti-infectives (From admission, onward)    None        Objective: Vitals:   09/18/21 2225 09/19/21 0025 09/19/21 0410 09/19/21 0816  BP: (!) 145/65 137/67 (!) 149/70 (!) 167/73  Pulse: 81   88  Resp: 19 19 15 14   Temp:  98.6 F (37 C) 98.3 F (36.8 C) 98.5 F (36.9 C)  TempSrc:  Oral Oral Oral  SpO2: 100%  100% 99%    Intake/Output Summary (Last 24 hours) at 09/19/2021 1152 Last data filed at 09/19/2021 0549 Gross per 24 hour  Intake 90 ml  Output 800 ml  Net -710 ml    There were no vitals filed for this visit.  Examination: General exam: Appears comfortable  HEENT:  oral mucosa moist, no sclera icterus or thrush Respiratory system: Clear to auscultation. Respiratory effort normal. Cardiovascular system: S1 & S2 heard, regular rate  and rhythm Gastrointestinal system: Abdomen soft, non-tender, nondistended. Normal bowel sounds   Skin: No rashes or ulcers Psychiatry:  flat affect    Data Reviewed: I have personally reviewed following labs and imaging studies  CBC: Recent Labs  Lab 09/16/21 1423 09/16/21 2211 09/17/21 0426 09/18/21 0731 09/18/21 1615 09/19/21 0400  WBC 6.6  --  7.7 10.2 11.9* 10.4  NEUTROABS 4.9  --   --   --   --   --   HGB 7.7* 5.9* 5.3* 10.6* 9.1* 8.6*  HCT 24.6* 18.4* 17.0* 30.7* 26.6* 25.6*  MCV 98.0  --  97.7 90.6 90.5 91.1  PLT 372  --  459* 524* 467*  437*    Basic Metabolic Panel: Recent Labs  Lab 09/16/21 1423 09/18/21 0731 09/19/21 0400  NA 138 141 137  K 3.5 3.1* 3.4*  CL 107 110 108  CO2 23 23 21*  GLUCOSE 155* 152* 114*  BUN 21 10 10   CREATININE 0.80 0.76 0.84  CALCIUM 8.7* 9.1 8.7*    GFR: CrCl cannot be calculated (Unknown ideal weight.). Liver Function Tests: Recent Labs  Lab 09/16/21 1423  AST 17  ALT 13  ALKPHOS 57  BILITOT 0.3  PROT 5.5*  ALBUMIN 3.2*    No results for input(s): LIPASE, AMYLASE in the last 168 hours. No results for input(s): AMMONIA in the last 168 hours. Coagulation Profile: Recent Labs  Lab 09/16/21 1423  INR 1.0    Cardiac Enzymes: No results for input(s): CKTOTAL, CKMB, CKMBINDEX, TROPONINI in the last 168 hours. BNP (last 3 results) No results for input(s): PROBNP in the last 8760 hours. HbA1C: Recent Labs    09/17/21 0426  HGBA1C 5.7*   CBG: No results for input(s): GLUCAP in the last 168 hours. Lipid Profile: Recent Labs    09/17/21 0426  CHOL 126  HDL 43  LDLCALC 57  TRIG 128  CHOLHDL 2.9    Thyroid Function Tests: No results for input(s): TSH, T4TOTAL, FREET4, T3FREE, THYROIDAB in the last 72 hours. Anemia Panel: Recent Labs    09/17/21 2022  VITAMINB12 370  FOLATE 14.0  TIBC 356  IRON 195*  RETICCTPCT 4.2*    Urine analysis:    Component Value Date/Time   COLORURINE YELLOW 09/16/2021 1423   APPEARANCEUR HAZY (A) 09/16/2021 1423   LABSPEC >1.046 (H) 09/16/2021 1423   PHURINE 5.0 09/16/2021 1423   GLUCOSEU NEGATIVE 09/16/2021 1423   GLUCOSEU NEGATIVE 03/08/2015 0855   HGBUR NEGATIVE 09/16/2021 [place] 09/16/2021 1423   KETONESUR NEGATIVE 09/16/2021 1423   PROTEINUR NEGATIVE 09/16/2021 1423   UROBILINOGEN 0.2 03/08/2015 0855   NITRITE NEGATIVE 09/16/2021 1423   LEUKOCYTESUR MODERATE (A) 09/16/2021 1423   Sepsis Labs: @LABRCNTIP (procalcitonin:4,lacticidven:4) ) Recent Results (from the past 240 hour(s))  Resp  Panel by RT-PCR (Flu A&B, Covid) Nasopharyngeal Swab     Status: None   Collection Time: 09/16/21  4:04 PM   Specimen: Nasopharyngeal Swab; Nasopharyngeal(NP) swabs in vial transport medium  Result Value Ref Range Status   SARS Coronavirus 2 by RT PCR NEGATIVE NEGATIVE Final    Comment: (NOTE) SARS-CoV-2 target nucleic acids are NOT DETECTED.  The SARS-CoV-2 RNA is generally detectable in upper respiratory specimens during the acute phase of infection. The lowest concentration of SARS-CoV-2 viral copies this assay can detect is 138 copies/mL. A negative result does not preclude SARS-Cov-2 infection and should not be used as the sole basis for treatment or other patient management decisions. A negative result  may occur with  improper specimen collection/handling, submission of specimen other than nasopharyngeal swab, presence of viral mutation(s) within the areas targeted by this assay, and inadequate number of viral copies(<138 copies/mL). A negative result must be combined with clinical observations, patient history, and epidemiological information. The expected result is Negative.  Fact Sheet for Patients:  EntrepreneurPulse.com.au  Fact Sheet for Healthcare Providers:  IncredibleEmployment.be  This test is no t yet approved or cleared by the [place] FDA and  has been authorized for detection and/or diagnosis of SARS-CoV-2 by FDA under an Emergency Use Authorization (EUA). This EUA will remain  in effect (meaning this test can be used) for the duration of the COVID-19 declaration under Section 564(b)(1) of the Act, 21 U.S.C.section 360bbb-3(b)(1), unless the authorization is terminated  or revoked sooner.       Influenza A by PCR NEGATIVE NEGATIVE Final   Influenza B by PCR NEGATIVE NEGATIVE Final    Comment: (NOTE) The Xpert Xpress SARS-CoV-2/FLU/RSV plus assay is intended as an aid in the diagnosis of influenza from Nasopharyngeal  swab specimens and should not be used as a sole basis for treatment. Nasal washings and aspirates are unacceptable for Xpert Xpress SARS-CoV-2/FLU/RSV testing.  Fact Sheet for Patients: EntrepreneurPulse.com.au  Fact Sheet for Healthcare Providers: IncredibleEmployment.be  This test is not yet approved or cleared by the [place] FDA and has been authorized for detection and/or diagnosis of SARS-CoV-2 by FDA under an Emergency Use Authorization (EUA). This EUA will remain in effect (meaning this test can be used) for the duration of the COVID-19 declaration under Section 564(b)(1) of the Act, 21 U.S.C. section 360bbb-3(b)(1), unless the authorization is terminated or revoked.  Performed at [facility], [place] [address] [place], [place] [address]          Radiology Studies: ECHOCARDIOGRAM COMPLETE  Result Date: 09/17/2021    ECHOCARDIOGRAM REPORT   Patient Name:   PARMIS FARAG Date of Exam: 09/17/2021 Medical Rec #:  [id number]         Height:       66.0 in Accession #:    [id number]        Weight:       160.1 lb Date of Birth:  June 21, 1956         BSA:          1.819 m Patient Age:    7 years          BP:           149/79 mmHg Patient Gender: F                 HR:           93 bpm. Exam Location:  Inpatient Procedure: 2D Echo, Cardiac Doppler and Color Doppler Indications:    Stroke I63.9  History:        Patient has prior history of Echocardiogram examinations, most                 recent 06/19/2021. Stroke; Risk Factors:Hypertension and                 Dyslipidemia.  Sonographer:    Merrie Roof RDCS Referring Phys: [place]  1. Left ventricular ejection fraction, by estimation, is 60 to 65%. The left ventricle has normal function. The left ventricle has no regional wall motion abnormalities. There is mild left ventricular hypertrophy. Left ventricular diastolic parameters were normal.  2. Right ventricular  systolic function is normal. The right ventricular size is normal. There is normal pulmonary artery systolic pressure. The estimated right ventricular systolic pressure is 123456 mmHg.  3. The mitral valve is normal in structure. No evidence of mitral valve regurgitation.  4. The aortic valve was not well visualized. Aortic valve regurgitation is not visualized. No aortic stenosis is present. FINDINGS  Left Ventricle: Left ventricular ejection fraction, by estimation, is 60 to 65%. The left ventricle has normal function. The left ventricle has no regional wall motion abnormalities. The left ventricular internal cavity size was normal in size. There is  mild left ventricular hypertrophy. Left ventricular diastolic parameters were normal. Right Ventricle: The right ventricular size is normal. No increase in right ventricular wall thickness. Right ventricular systolic function is normal. There is normal pulmonary artery systolic pressure. The tricuspid regurgitant velocity is 2.45 m/s, and  with an assumed right atrial pressure of 3 mmHg, the estimated right ventricular systolic pressure is 123456 mmHg. Left Atrium: Left atrial size was normal in size. Right Atrium: Right atrial size was normal in size. Pericardium: There is no evidence of pericardial effusion. Mitral Valve: The mitral valve is normal in structure. Mild to moderate mitral annular calcification. No evidence of mitral valve regurgitation. Tricuspid Valve: The tricuspid valve is normal in structure. Tricuspid valve regurgitation is trivial. Aortic Valve: The aortic valve was not well visualized. Aortic valve regurgitation is not visualized. No aortic stenosis is present. Aortic valve mean gradient measures 7.0 mmHg. Aortic valve peak gradient measures 13.0 mmHg. Aortic valve area, by VTI measures 1.88 cm. Pulmonic Valve: The pulmonic valve was not well visualized. Pulmonic valve regurgitation is not visualized. Aorta: The aortic root and ascending aorta are  structurally normal, with no evidence of dilitation. IAS/Shunts: The interatrial septum was not well visualized.  LEFT VENTRICLE PLAX 2D LVIDd:         4.10 cm   Diastology LVIDs:         2.70 cm   LV e' medial:    9.79 cm/s LV PW:         1.00 cm   LV E/e' medial:  11.4 LV IVS:        0.90 cm   LV e' lateral:   8.59 cm/s LVOT diam:     1.90 cm   LV E/e' lateral: 13.0 LV SV:         59 LV SV Index:   32 LVOT Area:     2.84 cm  RIGHT VENTRICLE RV Basal diam:  3.40 cm LEFT ATRIUM             Index        RIGHT ATRIUM           Index LA diam:        4.00 cm 2.20 cm/m   RA Area:     14.80 cm LA Vol (A2C):   63.1 ml 34.69 ml/m  RA Volume:   33.10 ml  18.20 ml/m LA Vol (A4C):   38.8 ml 21.33 ml/m LA Biplane Vol: 50.5 ml 27.76 ml/m  AORTIC VALVE AV Area (Vmax):    1.91 cm AV Area (Vmean):   1.93 cm AV Area (VTI):     1.88 cm AV Vmax:           180.00 cm/s AV Vmean:          123.000 cm/s AV VTI:            0.314 m AV  Peak Grad:      13.0 mmHg AV Mean Grad:      7.0 mmHg LVOT Vmax:         121.00 cm/s LVOT Vmean:        83.800 cm/s LVOT VTI:          0.208 m LVOT/AV VTI ratio: 0.66  AORTA Ao Root diam: 3.10 cm Ao Asc diam:  3.50 cm MITRAL VALVE                TRICUSPID VALVE MV Area (PHT): 4.60 cm     TR Peak grad:   24.0 mmHg MV Decel Time: 165 msec     TR Vmax:        245.00 cm/s MV E velocity: 112.00 cm/s MV A velocity: 140.00 cm/s  SHUNTS MV E/A ratio:  0.80         Systemic VTI:  0.21 m                             Systemic Diam: 1.90 cm Oswaldo Milian MD Electronically signed by Oswaldo Milian MD Signature Date/Time: 09/17/2021/3:25:22 PM    Final       Scheduled Meds:  sodium chloride   Intravenous Once   sodium chloride   Intravenous Once   amLODipine  10 mg Oral Daily   atorvastatin  80 mg Oral Daily   tiZANidine  2 mg Oral QHS   Continuous Infusions:   LOS: 3 days      Debbe Odea, MD [facility] Pager: [email or web address] 09/19/2021, 11:52 AM

## 2021-09-20 ENCOUNTER — Inpatient Hospital Stay (HOSPITAL_COMMUNITY): Payer: Medicare Other | Admitting: Anesthesiology

## 2021-09-20 ENCOUNTER — Encounter (HOSPITAL_COMMUNITY)
Admission: EM | Disposition: A | Discharge status: Skilled Nursing Facility | Payer: Self-pay | Source: Home / Self Care | Attending: Internal Medicine

## 2021-09-20 ENCOUNTER — Encounter (HOSPITAL_COMMUNITY): Payer: Self-pay | Admitting: Internal Medicine

## 2021-09-20 ENCOUNTER — Inpatient Hospital Stay (HOSPITAL_COMMUNITY): Payer: Medicare Other

## 2021-09-20 DIAGNOSIS — H543 Unqualified visual loss, both eyes: Secondary | ICD-10-CM

## 2021-09-20 DIAGNOSIS — K922 Gastrointestinal hemorrhage, unspecified: Secondary | ICD-10-CM

## 2021-09-20 DIAGNOSIS — K269 Duodenal ulcer, unspecified as acute or chronic, without hemorrhage or perforation: Secondary | ICD-10-CM

## 2021-09-20 DIAGNOSIS — K3189 Other diseases of stomach and duodenum: Secondary | ICD-10-CM

## 2021-09-20 HISTORY — PX: BIOPSY: SHX5522

## 2021-09-20 HISTORY — PX: ESOPHAGOGASTRODUODENOSCOPY (EGD) WITH PROPOFOL: SHX5813

## 2021-09-20 LAB — CBC
HCT: 27.9 % — ABNORMAL LOW (ref 36.0–46.0)
Hemoglobin: 9 g/dL — ABNORMAL LOW (ref 12.0–15.0)
MCH: 30 pg (ref 26.0–34.0)
MCHC: 32.3 g/dL (ref 30.0–36.0)
MCV: 93 fL (ref 80.0–100.0)
Platelets: 464 10*3/uL — ABNORMAL HIGH (ref 150–400)
RBC: 3 MIL/uL — ABNORMAL LOW (ref 3.87–5.11)
RDW: 14.6 % (ref 11.5–15.5)
WBC: 12.3 10*3/uL — ABNORMAL HIGH (ref 4.0–10.5)
nRBC: 0 % (ref 0.0–0.2)

## 2021-09-20 LAB — RESP PANEL BY RT-PCR (FLU A&B, COVID) ARPGX2
Influenza A by PCR: NEGATIVE
Influenza B by PCR: NEGATIVE
SARS Coronavirus 2 by RT PCR: NEGATIVE

## 2021-09-20 LAB — PARVOVIRUS B19 ANTIBODY, IGG AND IGM
Parovirus B19 IgG Abs: 4.8 index — ABNORMAL HIGH (ref 0.0–0.8)
Parovirus B19 IgM Abs: 0.2 index (ref 0.0–0.8)

## 2021-09-20 LAB — GLUCOSE, CAPILLARY: Glucose-Capillary: 163 mg/dL — ABNORMAL HIGH (ref 70–99)

## 2021-09-20 SURGERY — ESOPHAGOGASTRODUODENOSCOPY (EGD) WITH PROPOFOL
Anesthesia: Monitor Anesthesia Care

## 2021-09-20 MED ORDER — LIDOCAINE 2% (20 MG/ML) 5 ML SYRINGE
INTRAMUSCULAR | Status: DC | PRN
  Administered 2021-09-20: 70 mg via INTRAVENOUS

## 2021-09-20 MED ORDER — PROPOFOL 10 MG/ML IV BOLUS
INTRAVENOUS | Status: DC | PRN
  Administered 2021-09-20: 20 mg via INTRAVENOUS

## 2021-09-20 MED ORDER — SODIUM CHLORIDE 0.9 % IV SOLN: INTRAVENOUS | Status: DC

## 2021-09-20 MED ORDER — CLOPIDOGREL BISULFATE 75 MG PO TABS
75.0000 mg | ORAL_TABLET | Freq: Every day | ORAL | Status: DC
  Administered 2021-09-20 – 2021-09-22 (×3): 75 mg via ORAL
  Filled 2021-09-20 (×3): qty 1

## 2021-09-20 MED ORDER — CLOPIDOGREL BISULFATE 75 MG PO TABS: 75.0000 mg | ORAL_TABLET | Freq: Every day | ORAL | Status: DC

## 2021-09-20 MED ORDER — PROPOFOL 500 MG/50ML IV EMUL
INTRAVENOUS | Status: DC | PRN
Start: 2021-09-20 — End: 2021-09-20
  Administered 2021-09-20: 125 ug/kg/min via INTRAVENOUS

## 2021-09-20 MED ORDER — ASPIRIN EC 81 MG PO TBEC
81.0000 mg | DELAYED_RELEASE_TABLET | Freq: Every day | ORAL | Status: DC
  Administered 2021-09-20 – 2021-09-22 (×3): 81 mg via ORAL
  Filled 2021-09-20 (×3): qty 1

## 2021-09-20 MED ORDER — HYDRALAZINE HCL 20 MG/ML IJ SOLN: 5.0000 mg | Freq: Four times a day (QID) | INTRAMUSCULAR | Status: DC | PRN

## 2021-09-20 SURGICAL SUPPLY — 15 items

## 2021-09-20 NOTE — Plan of Care (Signed)
CT reviewed, large left occipital and right posterior MCA infarcts. 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 Most likely due to recent severe anemia and out of antiplatelet. Will add plavix on top of ASA, and repeat MRI and MRA head. EEG pending but likely low yield now.   Marvel Plan, MD PhD Stroke Neurology 09/20/2021 7:39 PM

## 2021-09-20 NOTE — Progress Notes (Signed)
Spoke with pts son,[name],  Pt in MRI, son informed.

## 2021-09-20 NOTE — Interval H&P Note (Signed)
History and Physical Interval Note:  09/20/2021 9:21 AM  Jade [name]  has presented today for surgery, with the diagnosis of GI blood loss anemia.  The various methods of treatment have been discussed with the patient and family. After consideration of risks, benefits and other options for treatment, the patient has consented to  Procedure(s): ESOPHAGOGASTRODUODENOSCOPY (EGD) WITH PROPOFOL (N/A) as a surgical intervention.  The patient's history has been reviewed, patient examined, no change in status, stable for surgery.  I have reviewed the patient's chart and labs.  Questions were answered to the patient's satisfaction.     Tressia Danas

## 2021-09-20 NOTE — Progress Notes (Signed)
PT Cancellation Note  Patient Details Name: BERNADETT MILIAN MRN: [id number] DOB: 1955-09-19   Cancelled Treatment:    Reason Eval/Treat Not Completed: Medical issues which prohibited therapy  Pt off floor in am for EGD and when checked with RN in pm reports symptoms worsened and going to CT.  Will hold at this time. Anise Salvo, PT Acute Rehab Services Pager [phone] [facility] [phone]  Rayetta Humphrey 09/20/2021, 4:48 PM

## 2021-09-20 NOTE — Progress Notes (Signed)
PROGRESS NOTE    NICHOL ATOR   XTK:[id number]  DOB: 24-Feb-1956  DOA: 09/16/2021 PCP: Nathaneil Canary, PA-C   Brief Narrative:   ZALEY TALLEY is a 66 year old female with a history of CVA, left carotid stenosis status post stent, hypertension who presents to the ED with blurred vision for a few days. MRI revealed multifocal posterior circulation infarcts felt to be embolic per neurology Also noted to have a hemoglobin around 7 which is a major drop from the 12-15 range about 1 month ago.  She initially declined a blood transfusion.   Subjective:  No significant events overnight as discussed with staff, she denies any complaints today.  Assessment & Plan:    Acute arterial ischemic stroke, multifocal, posterior circulation, unspecified laterality (HCC) -per Neuro, possibly watershed infarcts due to significant drop in hemoglobin in the setting of severe stenosis - was discharged home on aspirin and Plavix on 06/30/2021 with a plan to continue both for 2 months and then Plavix alone - cont Lipitor 80 mg which is a home med -GI recommends aspirin 81 mg for stroke prevention, Lipitor 80 mg, aspirin currently on hold for possible GI bleed, please see discussion below.     Acute anemia, normocytic - She is not aware of any bleeding no signs of bleeding in the hospital - Hemoglobin dropped from 7 to 5.3 the next day --not dilutional as she did not receive any IV fluids- initially declined transfusion but later accepted 2 U PRBC - I asked for a hematology opinion > recommended to hold Dantrolene , reticulocyte work-up has been negative per hematology, they have signed off, . -Undergoing work-up for chronic blood loss, GI input greatly appreciated, plan for endoscopy today, patient wants to hold off on colonoscopy.  Fever - temp 101 on 1/22 - no infectious source- ? Drug reaction to Dantrolene   HTN - cont Amlodipine which is a home med, remains elevated will add as needed  hydralazine.     Wheelchair-bound - Mostly wheelchair-bound- lives with her ex- husband -son lives nearby- PT recommends SNF- patient and husband in agreement  Glucose intolerance - A1c 5.7   DVT prophylaxis: SCDs, DVT prophylaxis held due to possible GI bleed. Code Status: DNR Family Communication: none at bedside Level of Care: Level of care: Progressive Disposition Plan:  Status is: Inpatient  Remains inpatient appropriate because: acute anemia with undetermined etiology, CVAs  Consultants:  Neurology Hematology Antimicrobials:  Anti-infectives (From admission, onward)    None        Objective: Vitals:   09/19/21 2335 09/20/21 0318 09/20/21 0803 09/20/21 0909  BP: 135/80 (!) 182/67 (!) 171/72 (!) 187/61  Pulse: 90 98 100 100  Resp: 17 18 12 14   Temp: 98.8 F (37.1 C) 98.9 F (37.2 C) 99.3 F (37.4 C) 98.7 F (37.1 C)  TempSrc: Oral Axillary Other (Comment) Temporal  SpO2: 100% 99%  100%  Weight:    72.6 kg  Height:    5\' 3"  (1.6 m)    Intake/Output Summary (Last 24 hours) at 09/20/2021 0928 Last data filed at 09/20/2021 0543 Gross per 24 hour  Intake --  Output 1400 ml  Net -1400 ml   Filed Weights   09/20/21 0909  Weight: 72.6 kg    Examination:  Awake Alert, flat affect Symmetrical Chest wall movement, Good air movement bilaterally, CTAB RRR,No Gallops,Rubs or new Murmurs, No Parasternal Heave +ve B.Sounds, Abd Soft, No tenderness, No rebound - guarding or rigidity. No Cyanosis, Clubbing  or edema, No new Rash or bruise        Data Reviewed: I have personally reviewed following labs and imaging studies  CBC: Recent Labs  Lab 09/16/21 1423 09/16/21 2211 09/18/21 0731 09/18/21 1615 09/19/21 0400 09/19/21 1537 09/20/21 0334  WBC 6.6   < > 10.2 11.9* 10.4 10.2 12.3*  NEUTROABS 4.9  --   --   --   --   --   --   HGB 7.7*   < > 10.6* 9.1* 8.6* 9.4* 9.0*  HCT 24.6*   < > 30.7* 26.6* 25.6* 28.5* 27.9*  MCV 98.0   < > 90.6 90.5 91.1  93.1 93.0  PLT 372   < > 524* 467* 437* 456* 464*   < > = values in this interval not displayed.   Basic Metabolic Panel: Recent Labs  Lab 09/16/21 1423 09/18/21 0731 09/19/21 0400  NA 138 141 137  K 3.5 3.1* 3.4*  CL 107 110 108  CO2 23 23 21*  GLUCOSE 155* 152* 114*  BUN 21 10 10   CREATININE 0.80 0.76 0.84  CALCIUM 8.7* 9.1 8.7*   GFR: Estimated Creatinine Clearance: 62.9 mL/min (by C-G formula based on SCr of 0.84 mg/dL). Liver Function Tests: Recent Labs  Lab 09/16/21 1423  AST 17  ALT 13  ALKPHOS 57  BILITOT 0.3  PROT 5.5*  ALBUMIN 3.2*   No results for input(s): LIPASE, AMYLASE in the last 168 hours. No results for input(s): AMMONIA in the last 168 hours. Coagulation Profile: Recent Labs  Lab 09/16/21 1423  INR 1.0   Cardiac Enzymes: No results for input(s): CKTOTAL, CKMB, CKMBINDEX, TROPONINI in the last 168 hours. BNP (last 3 results) No results for input(s): PROBNP in the last 8760 hours. HbA1C: No results for input(s): HGBA1C in the last 72 hours.  CBG: No results for input(s): GLUCAP in the last 168 hours. Lipid Profile: No results for input(s): CHOL, HDL, LDLCALC, TRIG, CHOLHDL, LDLDIRECT in the last 72 hours. Thyroid Function Tests: No results for input(s): TSH, T4TOTAL, FREET4, T3FREE, THYROIDAB in the last 72 hours. Anemia Panel: Recent Labs    09/17/21 2022  VITAMINB12 370  FOLATE 14.0  TIBC 356  IRON 195*  RETICCTPCT 4.2*   Urine analysis:    Component Value Date/Time   COLORURINE YELLOW 09/16/2021 1423   APPEARANCEUR HAZY (A) 09/16/2021 1423   LABSPEC >1.046 (H) 09/16/2021 1423   PHURINE 5.0 09/16/2021 1423   GLUCOSEU NEGATIVE 09/16/2021 1423   GLUCOSEU NEGATIVE 03/08/2015 0855   HGBUR NEGATIVE 09/16/2021 1423   BILIRUBINUR NEGATIVE 09/16/2021 1423   KETONESUR NEGATIVE 09/16/2021 1423   PROTEINUR NEGATIVE 09/16/2021 1423   UROBILINOGEN 0.2 03/08/2015 0855   NITRITE NEGATIVE 09/16/2021 1423   LEUKOCYTESUR MODERATE (A)  09/16/2021 1423   Sepsis Labs: @LABRCNTIP (procalcitonin:4,lacticidven:4) ) Recent Results (from the past 240 hour(s))  Resp Panel by RT-PCR (Flu A&B, Covid) Nasopharyngeal Swab     Status: None   Collection Time: 09/16/21  4:04 PM   Specimen: Nasopharyngeal Swab; Nasopharyngeal(NP) swabs in vial transport medium  Result Value Ref Range Status   SARS Coronavirus 2 by RT PCR NEGATIVE NEGATIVE Final    Comment: (NOTE) SARS-CoV-2 target nucleic acids are NOT DETECTED.  The SARS-CoV-2 RNA is generally detectable in upper respiratory specimens during the acute phase of infection. The lowest concentration of SARS-CoV-2 viral copies this assay can detect is 138 copies/mL. A negative result does not preclude SARS-Cov-2 infection and should not be used as the sole basis  for treatment or other patient management decisions. A negative result may occur with  improper specimen collection/handling, submission of specimen other than nasopharyngeal swab, presence of viral mutation(s) within the areas targeted by this assay, and inadequate number of viral copies(<138 copies/mL). A negative result must be combined with clinical observations, patient history, and epidemiological information. The expected result is Negative.  Fact Sheet for Patients:  BloggerCourse.com  Fact Sheet for Healthcare Providers:  SeriousBroker.it  This test is no t yet approved or cleared by the [place] FDA and  has been authorized for detection and/or diagnosis of SARS-CoV-2 by FDA under an Emergency Use Authorization (EUA). This EUA will remain  in effect (meaning this test can be used) for the duration of the COVID-19 declaration under Section 564(b)(1) of the Act, 21 U.S.C.section 360bbb-3(b)(1), unless the authorization is terminated  or revoked sooner.       Influenza A by PCR NEGATIVE NEGATIVE Final   Influenza B by PCR NEGATIVE NEGATIVE Final     Comment: (NOTE) The Xpert Xpress SARS-CoV-2/FLU/RSV plus assay is intended as an aid in the diagnosis of influenza from Nasopharyngeal swab specimens and should not be used as a sole basis for treatment. Nasal washings and aspirates are unacceptable for Xpert Xpress SARS-CoV-2/FLU/RSV testing.  Fact Sheet for Patients: BloggerCourse.com  Fact Sheet for Healthcare Providers: SeriousBroker.it  This test is not yet approved or cleared by the [place] FDA and has been authorized for detection and/or diagnosis of SARS-CoV-2 by FDA under an Emergency Use Authorization (EUA). This EUA will remain in effect (meaning this test can be used) for the duration of the COVID-19 declaration under Section 564(b)(1) of the Act, 21 U.S.C. section 360bbb-3(b)(1), unless the authorization is terminated or revoked.  Performed at [facility], [address], [place], [place] [address]          Radiology Studies: No results found.    Scheduled Meds:  [MAR Hold] sodium chloride   Intravenous Once   [MAR Hold] sodium chloride   Intravenous Once   [MAR Hold] amLODipine  10 mg Oral Daily   [MAR Hold] atorvastatin  80 mg Oral Daily   [MAR Hold] tiZANidine  2 mg Oral QHS   Continuous Infusions:  sodium chloride       LOS: 4 days      Huey Bienenstock, MD [facility] Pager: [email or web address] 09/20/2021, 9:28 AM  Patient ID: JAKIAH BIENAIME, female   DOB: [birth year], 66 y.o.   MRN: [id number]

## 2021-09-20 NOTE — Progress Notes (Signed)
Speech Language Pathology Treatment: Cognitive-Linquistic  Patient Details Name: Jade [name] MRN: [id number] DOB: 04/24/1956 Today's Date: 09/20/2021 Time: 4742-5956 SLP Time Calculation (min) (ACUTE ONLY): 12 min  Assessment / Plan / Recommendation Clinical Impression  Pt was seen for treatment with her son present. Pt was alert and cooperative during the session. Pt's son expressed that the pt's vision is different from when he last saw her and that her speech is impaired. Pt's motor speech skills were WNL and she was linguistically fluent. However, processing speed was slower than during the evaluation. Pt was able to follow simple commands with additional processing time and no significant word retrieval difficulty was noted during structured tasks. Pt's nurse reported a change since yesterday, but with worsening symptoms today. The session was abbreviated to facilitate neurology's assessment. SLP will continue to follow pt.    HPI HPI: Pt is a 66 y.o. female who presented with AMS and blurred vision for a few days. MRI revealed multifocal posterior circulation infarcts felt to be embolic per neurology. PMH: CVA, left carotid stenosis status post stent, hypertension, diabetes melitis. SLUMS 04/14/21: 22/26 with dififculty noted during generative naming, mentap manipulation, and recall.      SLP Plan  Continue with current plan of care      Recommendations for follow up therapy are one component of a multi-disciplinary discharge planning process, led by the attending physician.  Recommendations may be updated based on patient status, additional functional criteria and insurance authorization.    Recommendations                   Follow Up Recommendations: Skilled nursing-short term rehab (<3 hours/day) Assistance recommended at discharge: Frequent or constant Supervision/Assistance SLP Visit Diagnosis: Cognitive communication deficit (L87.564) Plan: Continue with current plan  of care         Verleen Stuckey I. Vear Clock, MS, CCC-SLP Acute Rehabilitation Services Office number [phone] Pager [phone]   Scheryl Marten  09/20/2021, 5:36 PM

## 2021-09-20 NOTE — Anesthesia Procedure Notes (Signed)
Procedure Name: MAC Date/Time: 09/20/2021 10:15 AM Performed by: Amadeo Garnet, CRNA Pre-anesthesia Checklist: Patient identified, Emergency Drugs available, Suction available and Patient being monitored Patient Re-evaluated:Patient Re-evaluated prior to induction Oxygen Delivery Method: Nasal cannula Preoxygenation: Pre-oxygenation with 100% oxygen Induction Type: IV induction Placement Confirmation: positive ETCO2 Dental Injury: Teeth and Oropharynx as per pre-operative assessment

## 2021-09-20 NOTE — Progress Notes (Signed)
STROKE TEAM PROGRESS NOTE   INTERVAL HISTORY Her son and RN and speech therapist are at the bedside. Neuro was called back today as pt was found to have vision changes and confusion. 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 Disorientated to year which she was orientated before. Will check CT head, EEG, and likely repeat MRI.   Her BP was stable. She had EGD today showed non-bleeding duodenal ulcer, and she was cleared for ASA and palvix by GI.   Vitals:   09/20/21 1050 09/20/21 1105 09/20/21 1225 09/20/21 1522  BP: (!) 147/63 136/64 (!) 145/48 (!) 149/52  Pulse: 90 90 95 88  Resp: (!) 22 15 16 17   Temp:  99.2 F (37.3 C) 98.2 F (36.8 C) 99.7 F (37.6 C)  TempSrc:   Oral Oral  SpO2: 100% 98% 100% 99%  Weight:      Height:       CBC:  Recent Labs  Lab 09/16/21 1423 09/16/21 2211 09/19/21 1537 09/20/21 0334  WBC 6.6   < > 10.2 12.3*  NEUTROABS 4.9  --   --   --   HGB 7.7*   < > 9.4* 9.0*  HCT 24.6*   < > 28.5* 27.9*  MCV 98.0   < > 93.1 93.0  PLT 372   < > 456* 464*   < > = values in this interval not displayed.   Basic Metabolic Panel:  Recent Labs  Lab 09/18/21 0731 09/19/21 0400  NA 141 137  K 3.1* 3.4*  CL 110 108  CO2 23 21*  GLUCOSE 152* 114*  BUN 10 10  CREATININE 0.76 0.84  CALCIUM 9.1 8.7*   Lipid Panel:  Recent Labs  Lab 09/17/21 0426  CHOL 126  TRIG 128  HDL 43  CHOLHDL 2.9  VLDL 26  LDLCALC 57   HgbA1c:  Recent Labs  Lab 09/17/21 0426  HGBA1C 5.7*   Urine Drug Screen:  Recent Labs  Lab 09/16/21 1423  LABOPIA NONE DETECTED  COCAINSCRNUR NONE DETECTED  LABBENZ NONE DETECTED  AMPHETMU NONE DETECTED  THCU NONE DETECTED  LABBARB NONE DETECTED    Alcohol Level  Recent Labs  Lab 09/16/21 1423  ETH <10    IMAGING past 24 hours No results found.  PHYSICAL EXAM  Temp:  [98.2 F (36.8 C)-99.7 F (37.6 C)] 99.7 F (37.6 C) (01/25 1522) Pulse Rate:  [84-100] 88 (01/25 1522) Resp:  [12-27] 17  (01/25 1522) BP: (128-187)/(48-84) 149/52 (01/25 1522) SpO2:  [98 %-100 %] 99 % (01/25 1522) Weight:  [72.6 kg] 72.6 kg (01/25 0909)  General - ill-appearing, fatigued caucasian female but in no acute distress.  Cardiovascular - Regular rhythm and rate.  Neuro - awake, alert, eyes open, orientated to age, place and people, but not to year or month. No aphasia, paucity of speech, following most simple commands. Able to name "nose" "ear" and able to repeat. No gaze palsy, not tracking bilaterally, bilateral vision loss in the setting of previous left hemianopia. No facial droop. Tongue midline. LUE 5/5, RUE 3-/5 proximal and distal 0/5 which is chronic. LLE 3/5 proximal and 4/5 distal. RLE 3/5 proximal and 0/5 distally. Sensation symmetrical bilaterally, FTN intact on the left, gait not tested.    ASSESSMENT/PLAN Ms. ARHIANA BOBIER is a 66 y.o. female with history of HTN, T2DM, CVD, multiple strokes since July 2022, HLD, L carotid stenosis s/p stent presenting with altered mental status and blurry  vision.   Bilateral vision loss with disorientation New since yesterday. Pt son stated that pt seems not able to track much yesterday when he saw her. RN also complained of visual changes yesterday but still able to see on the right visual field.  Mild disorientation but fluctuate Will do stat CT to rule out bleeding Will check EEG to rule out seizure May consider repeat MRI to evaluate now stroke Now on ASA, will resume plavix if CT no bleeding.   Stroke:  Multiple small infarcts involving right MCA, left PCA and b/l MCA/PCA territories likely secondary to intracranial stenosis in the setting of severe anemia CTA head & neck Stable with no evidence of LVO or emergent findings, advanced intracranial atherosclerotic disease, L stable patent carotid stent, R ICA <50% stenosis MRI  Patchy acute ischemic infarcts involving the right parieto-occipital region, additional small volume acute to early  subacute ischemic infarcts involving the posterior left splenium and adjacent subcortical left occipital lobe, underlying age-related cerebral atrophy with advanced chronic microvascular ischemic disease 2D Echo EF 60-65%  LDL 57 HgbA1c 5.7 VTE prophylaxis - SCD aspirin 81 mg daily and clopidogrel 75 mg daily prior to admission, now on ASA after GI clearance. If CT no bleeding, will add plavix too.   Therapy recommendations:  SNF Disposition:  pending  Hx of stroke 05/2021 admitted for generalized weakness.  MRI showed subacute bilateral corpus callosum infarcts due to bilateral PCA known high-grade stenosis.  CTA head and neck showed severe stenosis bilateral P2 and bilateral MCA branch vessels.  Repeat MRI showed 3 total new punctate infarcts at the bilateral hippocampi and left parieto-occipital lobe, consistent with high-grade PCA stenosis in the setting of hypotension.  EF initial report 25 to 30%, however later corrected to 40 to 45%.  LDL 133, A1c 5.7.  Patient was put on aspirin and Brilinta for 30 days and then back to aspirin and Plavix.  Patient discharged to CIR. 03/2021 admitted for right-sided weakness.  CT no acute abnormality.  CTA head and neck showed 75% stenosis of proximal left ICA.  Bilateral PCA high-grade stenosis.  MRI showed small acute infarct at the left frontoparietal junction.  Additional small infarct adjacent to the frontal horn of the left lateral ventricle.  Status post left TCAR 04/06/2021.  Postprocedure increase right leg weakness.  CT head and neck unchanged except interval left ICA stent, patent with 45% stenosis.  MRI on 8/12 showed interval enlargement/expansion of the previous left frontoparietal infarct, likely due to periprocedural fluctuating BP.  LDL 78, A1c 6.5.  Discharged with aspirin 325 and Plavix 75 for 3 months and then Plavix alone. Admitted in 02/2021 for blurry vision and hypertension, MRI showed left thalamic small infarcts.  CTA head and neck showed  left ICA 75% stenosis.  MRA head showed severe distal right M2, and right P2 stenosis.  A1c 6.4, LDL 184.  EF 55 to 60%, creatinine 1.10.  She was discharged with aspirin 81 Plavix 75 DAPT for 3 weeks and then Plavix alone, as well as Lipitor 80. Admitted 02/2018 with left thalamic/internal capsule infarct.  EF 60 to 65%, LDL 130, A1c 5.8.  CT head and neck at that time left ICA 80% stenosis.  Referred her to vascular surgery and was planning to do left CEA, however patient canceled the procedure herself.  She then followed with her PCP and referred to see a neurologist Dr. Mikeal Hawthorne in [place], had carotid Doppler on 02/20/2021 which reported as unremarkable.   Acute severe anemia  Hemoglobin baseline 12-14.  However, on presentation she was found hemoglobin 5.9->5.4.  Received PRBC transfusion.  Hemoglobin improved to 9-10. GI on board, had EGD today showed none bleeding duodenal ulcer.  Cleared by GI for aspirin Plavix CBC monitoring  Hypertension Home meds:  norvasc Stable Avoid low BP Long-term BP goal 130-150 given advanced intracranial athero  Hyperlipidemia Home meds:  lipitor 80, resumed in hospital LDL 57, goal < 70 On lipitor 80 mg  Continue statin at discharge  Diabetes type II Controlled Home meds: none HgbA1c 5.7, goal < 7.0 CBGs SSI  Other Stroke Risk Factors Advanced Age >/= 48  Former Cigarette smoker  Other Active Problems Cognitive impairment - likely vascular dementia  Hospital day # 4  [name], MD PhD Stroke Neurology 09/20/2021 6:01 PM  I discussed with Dr. Waldron Labs. I spent extensive time in counseling and coordination of care, reviewing test results, images and medication, and discussing the diagnosis, treatment plan and potential prognosis. This patient's care requiresreview of multiple databases, neurological assessment, discussion with family, other specialists and medical decision making of high complexity.      To contact Stroke Continuity  provider, please refer to [email or web address] After hours, contact General Neurology

## 2021-09-20 NOTE — Progress Notes (Signed)
Contacted Neuro due to vision and cognitive changes. Patient is not even receptive to light. Showing some new unusual behavior, like spitting out medication and food similar to childlike behavior. Son and this RN noticed decline since yesterday early evening. NIH was not a drastic enough change for Neuro to order CT.  Upon Neuro examination, STAT CT, EEG, and MRI were ordered. Awaiting results.

## 2021-09-20 NOTE — Anesthesia Postprocedure Evaluation (Signed)
Anesthesia Post Note  Patient: Jade [name]  Procedure(s) Performed: ESOPHAGOGASTRODUODENOSCOPY (EGD) WITH PROPOFOL BIOPSY     Patient location during evaluation: PACU Anesthesia Type: MAC Level of consciousness: awake and alert Pain management: pain level controlled Vital Signs Assessment: post-procedure vital signs reviewed and stable Respiratory status: spontaneous breathing, nonlabored ventilation and respiratory function stable Cardiovascular status: stable and blood pressure returned to baseline Anesthetic complications: no   No notable events documented.  Last Vitals:  Vitals:   09/20/21 1050 09/20/21 1105  BP: (!) 147/63 136/64  Pulse: 90 90  Resp: (!) 22 15  Temp:    SpO2: 100% 98%    Last Pain:  Vitals:   09/20/21 1105  TempSrc:   PainSc: 0-No pain                 Audry Pili

## 2021-09-20 NOTE — Plan of Care (Signed)

## 2021-09-20 NOTE — Op Note (Signed)
[facility] Patient Name: Jade [name] Procedure Date : 09/20/2021 MRN: [id number] Attending MD: Thornton Park MD, MD Date of Birth: May 25, 1956 CSN: [id number] Age: 66 Admit Type: Inpatient Procedure:                Upper GI endoscopy Indications:              Gastrointestinal bleeding of unknown origin. No                            localizing signs or symptoms. She requires chronic                            Plavix and ASA for a history of strokes. Providers:                Thornton Park MD, MD, Jeanella Cara, RN,                            [facility] Technician, Technician Referring MD:              Medicines:                Monitored Anesthesia Care Complications:            No immediate complications. Estimated Blood Loss:     Estimated blood loss was minimal. Procedure:                Pre-Anesthesia Assessment:                           - Prior to the procedure, a History and Physical                            was performed, and patient medications and                            allergies were reviewed. The patient's tolerance of                            previous anesthesia was also reviewed. The risks                            and benefits of the procedure and the sedation                            options and risks were discussed with the patient.                            All questions were answered, and informed consent                            was obtained. Prior Anticoagulants: The patient has                            taken Plavix (clopidogrel), last dose was 5 days  prior to procedure. ASA Grade Assessment: III - A                            patient with severe systemic disease. After                            reviewing the risks and benefits, the patient was                            deemed in satisfactory condition to undergo the                            procedure.                           After  obtaining informed consent, the endoscope was                            passed under direct vision. Throughout the                            procedure, the patient's blood pressure, pulse, and                            oxygen saturations were monitored continuously. The                            [id number] Olympus endoscope was introduced                            through the mouth, and advanced to the third part                            of duodenum. The upper GI endoscopy was                            accomplished without difficulty. The patient                            tolerated the procedure well. Scope In: Scope Out: Findings:      The esophagus was normal.      The entire examined stomach was normal. Biopsies were taken with a cold       forceps for histology. Estimated blood loss was minimal.      Patchy mildly erythematous mucosa without active bleeding and with no       stigmata of bleeding was found in the duodenal bulb.      One non-bleeding cratered duodenal ulcer with no stigmata of bleeding       was found in the first portion of the duodenum. The lesion was 5 mm in       largest dimension. Impression:               - Normal esophagus.                           -  Normal stomach. Biopsied.                           - Erythematous duodenopathy.                           - Non-bleeding duodenal ulcer with no stigmata of                            bleeding. Recommendation:           - Return patient to hospital ward for ongoing care.                           - Advance diet as tolerated.                           - Continue present medications.                           - Pantoprazole 40 mg BID x 10 weeks, then reduce to                            once daily while using ASA and Plavix.                           - Okay to resume Plavix and ASA.                           - Avoid all NSAIDs as you are able.                           - Await pathology results.                            - Consider repeat EGD in 10-12 weeks if gastric                            biopsies are negative for H pylori.                           The inpatient GI team will move to stand-by. Please                            call the on-call gastroenterologist for any                            additional questions or concerns during this                            hospitalization. Procedure Code(s):        --- Professional ---                           [phone], Esophagogastroduodenoscopy, flexible,  transoral; with biopsy, single or multiple Diagnosis Code(s):        --- Professional ---                           K31.89, Other diseases of stomach and duodenum                           K26.9, Duodenal ulcer, unspecified as acute or                            chronic, without hemorrhage or perforation                           K92.2, Gastrointestinal hemorrhage, unspecified CPT copyright 2019 American Medical Association. All rights reserved. The codes documented in this report are preliminary and upon coder review may  be revised to meet current compliance requirements. Thornton Park MD, MD 09/20/2021 10:39:46 AM This report has been signed electronically. Number of Addenda: 0

## 2021-09-20 NOTE — Plan of Care (Signed)

## 2021-09-20 NOTE — Anesthesia Preprocedure Evaluation (Addendum)
Anesthesia Evaluation  Patient identified by MRN, date of birth, ID band Patient awake    Reviewed: Allergy & Precautions, NPO status , Patient's Chart, lab work & pertinent test results  History of Anesthesia Complications Negative for: history of anesthetic complications  Airway Mallampati: II  TM Distance: >3 FB Neck ROM: Full    Dental   Pulmonary sleep apnea , COPD, former smoker,    Pulmonary exam normal        Cardiovascular hypertension, Pt. on medications Normal cardiovascular exam   '23 TTE - Normal EF, mild LVH, no significant valvulopathy    Neuro/Psych  Headaches, TIA Neuromuscular disease CVA, No Residual Symptoms negative psych ROS   GI/Hepatic Neg liver ROS,  GIB    Endo/Other  diabetes  Renal/GU CRFRenal disease     Musculoskeletal  (+) Arthritis , Fibromyalgia - Gout    Abdominal   Peds  Hematology  (+) anemia ,  On plavix    Anesthesia Other Findings   Reproductive/Obstetrics                            Anesthesia Physical Anesthesia Plan  ASA: 3  Anesthesia Plan: MAC   Post-op Pain Management:    Induction:   PONV Risk Score and Plan: 2 and Propofol infusion and Treatment may vary due to age or medical condition  Airway Management Planned: Nasal Cannula and Natural Airway  Additional Equipment: None  Intra-op Plan:   Post-operative Plan:   Informed Consent: I have reviewed the patients History and Physical, chart, labs and discussed the procedure including the risks, benefits and alternatives for the proposed anesthesia with the patient or authorized representative who has indicated his/her understanding and acceptance.   Patient has DNR.  Discussed DNR with patient and Suspend DNR.     Plan Discussed with: CRNA and Anesthesiologist  Anesthesia Plan Comments:        Anesthesia Quick Evaluation

## 2021-09-20 NOTE — TOC Progression Note (Addendum)
Transition of Care [facility]) - Progression Note    Patient Details  Name: Jade [name] MRN: [id number] Date of Birth: 1956/01/17  [facility] CM/SW Contact  Kermit Balo, RN Phone Number: 09/20/2021, 1:33 PM  Clinical Narrative:    Patients son has selected [facility]. CM has updated Olegario Messier at [facility] and they will have a bed tomorrow. CM has asked MOA to start insurance.  TOC following.  1550: pt approved through [employer] for SNF rehab: 1/26 - 1/30.  Reference Id: [id number]  Expected Discharge Plan: Skilled Nursing Facility Barriers to Discharge: Continued Medical Work up  Expected Discharge Plan and Services Expected Discharge Plan: Skilled Nursing Facility In-house Referral: Clinical Social Work Discharge Planning Services: CM Consult Post Acute Care Choice: Skilled Nursing Facility Living arrangements for the past 2 months: Single Family Home                                       Social Determinants of Health (SDOH) Interventions    Readmission Risk Interventions Readmission Risk Prevention Plan 06/30/2021  Transportation Screening Complete  PCP or Specialist Appt within 3-5 Days Complete  HRI or Home Care Consult Complete  Social Work Consult for Recovery Care Planning/Counseling Complete  Palliative Care Screening Not Applicable  Some recent data might be hidden

## 2021-09-20 NOTE — Transfer of Care (Signed)
Immediate Anesthesia Transfer of Care Note  Patient: Jade [name]  Procedure(s) Performed: ESOPHAGOGASTRODUODENOSCOPY (EGD) WITH PROPOFOL BIOPSY  Patient Location: PACU  Anesthesia Type:MAC  Level of Consciousness: awake, alert  and oriented  Airway & Oxygen Therapy: Patient Spontanous Breathing and Patient connected to nasal cannula oxygen  Post-op Assessment: Report given to RN, Post -op Vital signs reviewed and stable and Patient moving all extremities  Post vital signs: Reviewed and stable  Last Vitals:  Vitals Value Taken Time  BP 128/54 09/20/21 1036  Temp    Pulse 101 09/20/21 1038  Resp 26 09/20/21 1038  SpO2 100 % 09/20/21 1038  Vitals shown include unvalidated device data.  Last Pain:  Vitals:   09/20/21 0909  TempSrc: Temporal  PainSc: 0-No pain         Complications: No notable events documented.

## 2021-09-21 ENCOUNTER — Encounter (HOSPITAL_COMMUNITY): Payer: Self-pay | Admitting: Gastroenterology

## 2021-09-21 ENCOUNTER — Inpatient Hospital Stay (HOSPITAL_COMMUNITY): Payer: Medicare Other

## 2021-09-21 LAB — SURGICAL PATHOLOGY

## 2021-09-21 NOTE — Progress Notes (Signed)
EEG complete - results pending 

## 2021-09-21 NOTE — Progress Notes (Signed)
PROGRESS NOTE    Jade [name]   ZOX:[id number]  DOB: [birth year]  DOA: 09/16/2021 PCP: Nathaneil Canary, PA-C   Brief Narrative:   Jade [name] is a 66 year old female with a history of CVA, left carotid stenosis status post stent, hypertension who presents to the ED with blurred vision for a few days. MRI revealed multifocal posterior circulation infarcts felt to be embolic per neurology Also noted to have a hemoglobin around 7 which is a major drop from the 12-15 range about 1 month ago.  She initially declined a blood transfusion.  She was seen by GI, went for endoscopy 1/25, which was significant for nonbleeding duodenal ulcer and tendinitis, cleared to resume her aspirin and Plavix again, unfortunately patient focal neurological deficits continue to worsen and, repeat CT on 1/25, showing progression and extension of her bilateral infarcts.   Subjective:  Patient with worsening focal neurological deficits yesterday, with worsening vision as well.  Assessment & Plan:    Acute arterial ischemic stroke, multifocal, posterior circulation, unspecified laterality (HCC) -per Neuro, possibly watershed infarcts due to significant drop in hemoglobin in the setting of severe stenosis - was discharged home on aspirin and Plavix on 06/30/2021 with a plan to continue both for 2 months and then Plavix alone - cont Lipitor 80 mg which is a home. -Patient with worsening vision 1/25, repeat CT showing progression of her infarction, she is currently back on aspirin and Plavix, she is on a statin as well, avoid hypotension.  Acute anemia, normocytic - She is not aware of any bleeding no signs of bleeding in the hospital. -Hemoglobin of 7 on admission, dropped to 5.3, she was transfused 2 units PRBC, hemoglobin remained stable since. -Hematology input appreciated, no evidence of hemolysis. -GI input appreciated, s/p endoscopy 1/25 significant for erythematous duodenopathy and nonbleeding  duodenal ulcer, recommendation to continue with Protonix 40 mg twice daily x10 weeks then daily.  Fever - temp 101 on 1/22 - no infectious source- ? Drug reaction to Dantrolene   HTN - cont Amlodipine which is a home med, remains elevated will add as needed hydralazine.     Wheelchair-bound - Mostly wheelchair-bound- lives with her ex- husband -son lives nearby- PT recommends SNF- patient and husband in agreement  Glucose intolerance - A1c 5.7   DVT prophylaxis: SCDs, will start on subcu heparin tomorrow now GI bleed has stabilized. Code Status: DNR Family Communication: none at bedside, I was unable to reach son or husband by phone, both were left voicemails. Level of Care: Level of care: Progressive Disposition Plan:  Status is: Inpatient  Remains inpatient appropriate because: Acute CVA, with worsening neurological symptoms  Consultants:  Neurology Hematology Antimicrobials:  Anti-infectives (From admission, onward)    None        Objective: Vitals:   09/20/21 1930 09/21/21 0028 09/21/21 0335 09/21/21 0826  BP: (!) 163/54 (!) 144/70 (!) 139/59 (!) 146/72  Pulse: 100 91 83 89  Resp: Temp:  99.2 F (37.3 C) 98 F (36.7 C) 98.8 F (37.1 C)  TempSrc:  Axillary Axillary Oral  SpO2: 99% 99% 98% 100%  Weight:      Height:        Intake/Output Summary (Last 24 hours) at 09/21/2021 1121 Last data filed at 09/21/2021 0028 Gross per 24 hour  Intake --  Output 700 ml  Net -700 ml   Filed Weights   09/20/21 0909  Weight: 72.6 kg    Examination:  Awake Alert, he is mildly confused today, eyes with no tracking bilaterally, bilateral vision loss symmetrical Chest wall movement, Good air movement bilaterally, CTAB RRR,No Gallops,Rubs or new Murmurs, No Parasternal Heave +ve B.Sounds, Abd Soft, No tenderness, No rebound - guarding or rigidity. No Cyanosis, Clubbing or edema, No new Rash or bruise         Data Reviewed: I have personally  reviewed following labs and imaging studies  CBC: Recent Labs  Lab 09/16/21 1423 09/16/21 2211 09/18/21 0731 09/18/21 1615 09/19/21 0400 09/19/21 1537 09/20/21 0334  WBC 6.6   < > 10.2 11.9* 10.4 10.2 12.3*  NEUTROABS 4.9  --   --   --   --   --   --   HGB 7.7*   < > 10.6* 9.1* 8.6* 9.4* 9.0*  HCT 24.6*   < > 30.7* 26.6* 25.6* 28.5* 27.9*  MCV 98.0   < > 90.6 90.5 91.1 93.1 93.0  PLT 372   < > 524* 467* 437* 456* 464*   < > = values in this interval not displayed.   Basic Metabolic Panel: Recent Labs  Lab 09/16/21 1423 09/18/21 0731 09/19/21 0400  NA 138 141 137  K 3.5 3.1* 3.4*  CL 107 110 108  CO2 23 23 21*  GLUCOSE 155* 152* 114*  BUN 21 10 10   CREATININE 0.80 0.76 0.84  CALCIUM 8.7* 9.1 8.7*   GFR: Estimated Creatinine Clearance: 62.9 mL/min (by C-G formula based on SCr of 0.84 mg/dL). Liver Function Tests: Recent Labs  Lab 09/16/21 1423  AST 17  ALT 13  ALKPHOS 57  BILITOT 0.3  PROT 5.5*  ALBUMIN 3.2*   No results for input(s): LIPASE, AMYLASE in the last 168 hours. No results for input(s): AMMONIA in the last 168 hours. Coagulation Profile: Recent Labs  Lab 09/16/21 1423  INR 1.0   Cardiac Enzymes: No results for input(s): CKTOTAL, CKMB, CKMBINDEX, TROPONINI in the last 168 hours. BNP (last 3 results) No results for input(s): PROBNP in the last 8760 hours. HbA1C: No results for input(s): HGBA1C in the last 72 hours.  CBG: Recent Labs  Lab 09/20/21 1038  GLUCAP 163*   Lipid Profile: No results for input(s): CHOL, HDL, LDLCALC, TRIG, CHOLHDL, LDLDIRECT in the last 72 hours. Thyroid Function Tests: No results for input(s): TSH, T4TOTAL, FREET4, T3FREE, THYROIDAB in the last 72 hours. Anemia Panel: No results for input(s): VITAMINB12, FOLATE, FERRITIN, TIBC, IRON, RETICCTPCT in the last 72 hours.  Urine analysis:    Component Value Date/Time   COLORURINE YELLOW 09/16/2021 1423   APPEARANCEUR HAZY (A) 09/16/2021 1423   LABSPEC >1.046  (H) 09/16/2021 1423   PHURINE 5.0 09/16/2021 1423   GLUCOSEU NEGATIVE 09/16/2021 1423   GLUCOSEU NEGATIVE 03/08/2015 0855   HGBUR NEGATIVE 09/16/2021 1423   BILIRUBINUR NEGATIVE 09/16/2021 1423   KETONESUR NEGATIVE 09/16/2021 1423   PROTEINUR NEGATIVE 09/16/2021 1423   UROBILINOGEN 0.2 03/08/2015 0855   NITRITE NEGATIVE 09/16/2021 1423   LEUKOCYTESUR MODERATE (A) 09/16/2021 1423   Sepsis Labs: @LABRCNTIP (procalcitonin:4,lacticidven:4) ) Recent Results (from the past 240 hour(s))  Resp Panel by RT-PCR (Flu A&B, Covid) Nasopharyngeal Swab     Status: None   Collection Time: 09/16/21  4:04 PM   Specimen: Nasopharyngeal Swab; Nasopharyngeal(NP) swabs in vial transport medium  Result Value Ref Range Status   SARS Coronavirus 2 by RT PCR NEGATIVE NEGATIVE Final    Comment: (NOTE) SARS-CoV-2 target nucleic acids are NOT DETECTED.  The SARS-CoV-2 RNA is generally detectable  in upper respiratory specimens during the acute phase of infection. The lowest concentration of SARS-CoV-2 viral copies this assay can detect is 138 copies/mL. A negative result does not preclude SARS-Cov-2 infection and should not be used as the sole basis for treatment or other patient management decisions. A negative result may occur with  improper specimen collection/handling, submission of specimen other than nasopharyngeal swab, presence of viral mutation(s) within the areas targeted by this assay, and inadequate number of viral copies(<138 copies/mL). A negative result must be combined with clinical observations, patient history, and epidemiological information. The expected result is Negative.  Fact Sheet for Patients:  BloggerCourse.com  Fact Sheet for Healthcare Providers:  SeriousBroker.it  This test is no t yet approved or cleared by the [place] FDA and  has been authorized for detection and/or diagnosis of SARS-CoV-2 by FDA under an Emergency  Use Authorization (EUA). This EUA will remain  in effect (meaning this test can be used) for the duration of the COVID-19 declaration under Section 564(b)(1) of the Act, 21 U.S.C.section 360bbb-3(b)(1), unless the authorization is terminated  or revoked sooner.       Influenza A by PCR NEGATIVE NEGATIVE Final   Influenza B by PCR NEGATIVE NEGATIVE Final    Comment: (NOTE) The Xpert Xpress SARS-CoV-2/FLU/RSV plus assay is intended as an aid in the diagnosis of influenza from Nasopharyngeal swab specimens and should not be used as a sole basis for treatment. Nasal washings and aspirates are unacceptable for Xpert Xpress SARS-CoV-2/FLU/RSV testing.  Fact Sheet for Patients: BloggerCourse.com  Fact Sheet for Healthcare Providers: SeriousBroker.it  This test is not yet approved or cleared by the [place] FDA and has been authorized for detection and/or diagnosis of SARS-CoV-2 by FDA under an Emergency Use Authorization (EUA). This EUA will remain in effect (meaning this test can be used) for the duration of the COVID-19 declaration under Section 564(b)(1) of the Act, 21 U.S.C. section 360bbb-3(b)(1), unless the authorization is terminated or revoked.  Performed at [facility], [address], [place], [place] [address]   Resp Panel by RT-PCR (Flu A&B, Covid) Nasopharyngeal Swab     Status: None   Collection Time: 09/20/21  2:28 PM   Specimen: Nasopharyngeal Swab; Nasopharyngeal(NP) swabs in vial transport medium  Result Value Ref Range Status   SARS Coronavirus 2 by RT PCR NEGATIVE NEGATIVE Final    Comment: (NOTE) SARS-CoV-2 target nucleic acids are NOT DETECTED.  The SARS-CoV-2 RNA is generally detectable in upper respiratory specimens during the acute phase of infection. The lowest concentration of SARS-CoV-2 viral copies this assay can detect is 138 copies/mL. A negative result does not preclude  SARS-Cov-2 infection and should not be used as the sole basis for treatment or other patient management decisions. A negative result may occur with  improper specimen collection/handling, submission of specimen other than nasopharyngeal swab, presence of viral mutation(s) within the areas targeted by this assay, and inadequate number of viral copies(<138 copies/mL). A negative result must be combined with clinical observations, patient history, and epidemiological information. The expected result is Negative.  Fact Sheet for Patients:  BloggerCourse.com  Fact Sheet for Healthcare Providers:  SeriousBroker.it  This test is no t yet approved or cleared by the [place] FDA and  has been authorized for detection and/or diagnosis of SARS-CoV-2 by FDA under an Emergency Use Authorization (EUA). This EUA will remain  in effect (meaning this test can be used) for the duration of the COVID-19 declaration under Section  564(b)(1) of the Act, 21 U.S.C.section 360bbb-3(b)(1), unless the authorization is terminated  or revoked sooner.       Influenza A by PCR NEGATIVE NEGATIVE Final   Influenza B by PCR NEGATIVE NEGATIVE Final    Comment: (NOTE) The Xpert Xpress SARS-CoV-2/FLU/RSV plus assay is intended as an aid in the diagnosis of influenza from Nasopharyngeal swab specimens and should not be used as a sole basis for treatment. Nasal washings and aspirates are unacceptable for Xpert Xpress SARS-CoV-2/FLU/RSV testing.  Fact Sheet for Patients: BloggerCourse.com  Fact Sheet for Healthcare Providers: SeriousBroker.it  This test is not yet approved or cleared by the [place] FDA and has been authorized for detection and/or diagnosis of SARS-CoV-2 by FDA under an Emergency Use Authorization (EUA). This EUA will remain in effect (meaning this test can be used) for the duration of  the COVID-19 declaration under Section 564(b)(1) of the Act, 21 U.S.C. section 360bbb-3(b)(1), unless the authorization is terminated or revoked.  Performed at [facility], [address], [place], [place] [address]          Radiology Studies: CT HEAD WO CONTRAST ( )  Result Date: 09/20/2021 CLINICAL DATA:  Binocular visual loss, worsening mentation in vision. History of stroke, chronic kidney disease, COPD, hypertension, migraines, type II diabetes mellitus, former smoker EXAM: CT HEAD WITHOUT CONTRAST TECHNIQUE: Contiguous axial images were obtained from the base of the skull through the vertex without intravenous contrast. RADIATION DOSE REDUCTION: This exam was performed according to the departmental dose-optimization program which includes automated exposure control, adjustment of the mA and/or kV according to patient size and/or use of iterative reconstruction technique. COMPARISON:  06/21/2021; correlation MR brain 09/16/2021 FINDINGS: Brain: Generalized atrophy. Normal ventricular morphology. No midline shift or mass effect. Extensive small vessel chronic ischemic changes of deep cerebral white matter. Large subacute infarcts identified posterior RIGHT parieto-occipital and LEFT occipital lobes. Old infarct posterior high LEFT parietal. Tiny old lacunar infarct RIGHT thalamus. No new areas of infarction or hemorrhage. Arachnoid cyst at anterior aspect of LEFT middle cranial fossa, 4.0 x 2.0 x 1.9 cm. No new extra-axial fluid collections. Vascular: No definite hyperdense vessels. Atherosclerotic calcification of internal carotid and vertebral arteries at skull base. Skull: Intact Sinuses/Orbits: Mucous versus fluid in sphenoid sinus. Remaining paranasal sinuses and mastoid air cells clear. Other: N/A IMPRESSION: Evolving subacute infarcts involving the LEFT occipital lobe and posterior RIGHT parieto-occipital lobe, both larger than seen on recent MR. Atrophy with small vessel chronic  ischemic changes of deep cerebral white matter. Old high LEFT parietal and RIGHT thalamic infarcts. Stable arachnoid cyst RIGHT middle cranial fossa. No intracranial hemorrhage. Findings called to Dr. Randol Kern on 09/20/2021 at 1838 hours. Electronically Signed   By: Ulyses Southward M.D.   On: 09/20/2021 18:39   MR ANGIO HEAD WO CONTRAST  Result Date: 09/20/2021 CLINICAL DATA:  Stroke, follow-up EXAM: MRI HEAD WITHOUT CONTRAST MRA HEAD WITHOUT CONTRAST TECHNIQUE: Multiplanar, multi-echo pulse sequences of the brain and surrounding structures were acquired without intravenous contrast. Angiographic images of the Circle of Willis were acquired using MRA technique without intravenous contrast. COMPARISON:  09/16/2021 MRI head, correlation is also made with CT head 09/20/2021 and CTA 09/16/2021 FINDINGS: Exam somewhat limited by motion and early termination at patient request. MRI HEAD FINDINGS Brain: New large confluent areas of restricted diffusion with ADC correlates in the right sylvian fissure (series 4, image 27), posterior greater than anterior right temporal lobe (series 4, image 28), right parietal lobe (series 4, image  39), right occipital lobe (series 4, image 22), left occipital lobe (series 4, image 17), and posterior left hippocampus and medial temporal lobe (series 4, image 23). Additional more isolated areas of restricted diffusion in the left splenium of the corpus callosum (series 4, image 29), which was seen on the prior exam, and lateral left temporal lobe (series 4, image 18), which was not. These areas are associated with increased T2 signal and gyral swelling. Some of these areas previously demonstrated restricted diffusion on 09/16/2021, but not to this extent. No acute hemorrhage, mass, mass effect, or midline shift. Unchanged size and configuration of the ventricles. Advanced cerebral atrophy for age. Confluent T2 hyperintense signal in the periventricular white matter and pons, likely the  sequela of severe chronic small vessel ischemic disease. Chronic hemosiderin deposition at the left anterior corpus callosum is again noted. Right middle cranial fossa arachnoid cyst. Vascular: Please see MRA findings below. Skull and upper cervical spine: Normal marrow signal. Sinuses/Orbits: Air-fluid level in the left sphenoid sinus, similar to the prior exam. Otherwise negative. Other: The mastoids are well aerated. MRA HEAD FINDINGS Anterior circulation: Both internal carotid arteries are patent to the termini, without stenosis or other abnormality. A1 segments patent. Normal anterior communicating artery. Anterior cerebral arteries are patent to their distal aspects. No M1 stenosis or occlusion. Normal MCA bifurcations. Focal narrowing in bilateral M2 branches, unchanged from the prior exam. Distal MCA branches otherwise perfused and symmetric. Posterior circulation: Vertebral arteries patent to the vertebrobasilar junction without stenosis. Basilar patent to its distal aspect. Superior cerebellar arteries patent bilaterally. Normal P1 segments. Redemonstrated severe stenosis in the bilateral P2 segments, which is near occlusive bilaterally. Anatomic variants: None significant IMPRESSION: 1. Exam is somewhat limited by motion and early termination. 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 Given multiple vascular territories, an embolic etiology is suspected. 2. Redemonstrated bilateral M2 and P2 severe stenosis. No new occlusion or significant stenosis. These results will be called to the ordering clinician or representative by the Radiologist Assistant, and communication documented in the PACS or [employer]. Electronically Signed   By: Wiliam Ke M.D.   On: 09/20/2021 21:47   MR BRAIN WO CONTRAST  Result Date: 09/20/2021 CLINICAL DATA:  Stroke, follow-up EXAM: MRI HEAD WITHOUT CONTRAST  MRA HEAD WITHOUT CONTRAST TECHNIQUE: Multiplanar, multi-echo pulse sequences of the brain and surrounding structures were acquired without intravenous contrast. Angiographic images of the Circle of Willis were acquired using MRA technique without intravenous contrast. COMPARISON:  09/16/2021 MRI head, correlation is also made with CT head 09/20/2021 and CTA 09/16/2021 FINDINGS: Exam somewhat limited by motion and early termination at patient request. MRI HEAD FINDINGS Brain: New large confluent areas of restricted diffusion with ADC correlates in the right sylvian fissure (series 4, image 27), posterior greater than anterior right temporal lobe (series 4, image 28), right parietal lobe (series 4, image 39), right occipital lobe (series 4, image 22), left occipital lobe (series 4, image 17), and posterior left hippocampus and medial temporal lobe (series 4, image 23). Additional more isolated areas of restricted diffusion in the left splenium of the corpus callosum (series 4, image 29), which was seen on the prior exam, and lateral left temporal lobe (series 4, image 18), which was not. These areas are associated with increased T2 signal and gyral swelling. Some of these areas previously demonstrated restricted diffusion on 09/16/2021, but not to this extent. No  acute hemorrhage, mass, mass effect, or midline shift. Unchanged size and configuration of the ventricles. Advanced cerebral atrophy for age. Confluent T2 hyperintense signal in the periventricular white matter and pons, likely the sequela of severe chronic small vessel ischemic disease. Chronic hemosiderin deposition at the left anterior corpus callosum is again noted. Right middle cranial fossa arachnoid cyst. Vascular: Please see MRA findings below. Skull and upper cervical spine: Normal marrow signal. Sinuses/Orbits: Air-fluid level in the left sphenoid sinus, similar to the prior exam. Otherwise negative. Other: The mastoids are well aerated. MRA HEAD  FINDINGS Anterior circulation: Both internal carotid arteries are patent to the termini, without stenosis or other abnormality. A1 segments patent. Normal anterior communicating artery. Anterior cerebral arteries are patent to their distal aspects. No M1 stenosis or occlusion. Normal MCA bifurcations. Focal narrowing in bilateral M2 branches, unchanged from the prior exam. Distal MCA branches otherwise perfused and symmetric. Posterior circulation: Vertebral arteries patent to the vertebrobasilar junction without stenosis. Basilar patent to its distal aspect. Superior cerebellar arteries patent bilaterally. Normal P1 segments. Redemonstrated severe stenosis in the bilateral P2 segments, which is near occlusive bilaterally. Anatomic variants: None significant IMPRESSION: 1. Exam is somewhat limited by motion and early termination. 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 Given multiple vascular territories, an embolic etiology is suspected. 2. Redemonstrated bilateral M2 and P2 severe stenosis. No new occlusion or significant stenosis. These results will be called to the ordering clinician or representative by the Radiologist Assistant, and communication documented in the PACS or [employer]. Electronically Signed   By: Wiliam Ke M.D.   On: 09/20/2021 21:47   EEG adult  Result Date: 09/21/2021 Charlsie Quest, MD     09/21/2021  9:48 AM Patient Name: Jade [name] MRN: [id number] Epilepsy Attending: Charlsie Quest Referring Physician/Provider: Starleen Arms, MD Date: 09/21/2021 Duration: 22.28 mins Patient history: 66 year old female with vision changes and confusion.  EEG to evaluate for seizures. Level of alertness: Awake, asleep AEDs during EEG study: None Technical aspects: This EEG study was done with scalp electrodes positioned according to the 10-20 International system of  electrode placement. Electrical activity was acquired at a sampling rate of  and reviewed with a high frequency filter of  and a low frequency filter of . EEG data were recorded continuously and digitally stored. Description: The posterior dominant rhythm consists of 8 Hz activity of moderate voltage (25-35 uV) seen predominantly in posterior head regions, asymmetric ( right<left) and reactive to eye opening and eye closing. Sleep was characterized by vertex waves, sleep spindles (12 to 14 Hz), maximal frontocentral region.  EEG showed continuous 3 to 5 Hz theta-delta slowing in right hemisphere as well as intermittent 3 to 5 Hz theta-delta slowing left hemisphere.  Hyperventilation and photic stimulation were not performed.   ABNORMALITY - Continuous slow, right hemisphere - Intermittent slow, left hemisphere IMPRESSION: This study is suggestive of cortical dysfunction in right hemisphere likely secondary to underlying structural abnormality/stroke.  Additionally there is evidence of nonspecific cortical dysfunction in left hemisphere which could be secondary to underlying strokes as well.  No seizures or epileptiform discharges were seen throughout the recording. Jade [name]      Scheduled Meds:  sodium chloride   Intravenous Once   sodium chloride   Intravenous Once   amLODipine  10 mg Oral Daily   aspirin EC  81 mg Oral Daily  atorvastatin  80 mg Oral Daily   clopidogrel  75 mg Oral Daily   tiZANidine  2 mg Oral QHS   Continuous Infusions:     LOS: 5 days      Huey Bienenstock, MD [facility] Pager: [email or web address] 09/21/2021, 11:21 AM  Patient ID: Jade [name], female   DOB: 06-05-1956, 66 y.o.   MRN: [id number] Patient ID: Jade [name], female   DOB: 01/08/1956, 66 y.o.   MRN: [id number]

## 2021-09-21 NOTE — Procedures (Signed)
Patient Name: Jade [name]  MRN: [id number]  Epilepsy Attending: Charlsie Quest  Referring Physician/Provider: Starleen Arms, MD Date: 09/21/2021 Duration: 22.28 mins  Patient history: 66 year old female with vision changes and confusion.  EEG to evaluate for seizures.  Level of alertness: Awake, asleep  AEDs during EEG study: None  Technical aspects: This EEG study was done with scalp electrodes positioned according to the 10-20 International system of electrode placement. Electrical activity was acquired at a sampling rate of 500Hz  and reviewed with a high frequency filter of 70Hz  and a low frequency filter of 1Hz . EEG data were recorded continuously and digitally stored.   Description: The posterior dominant rhythm consists of 8 Hz activity of moderate voltage (25-35 uV) seen predominantly in posterior head regions, asymmetric ( right<left) and reactive to eye opening and eye closing. Sleep was characterized by vertex waves, sleep spindles (12 to 14 Hz), maximal frontocentral region.  EEG showed continuous 3 to 5 Hz theta-delta slowing in right hemisphere as well as intermittent 3 to 5 Hz theta-delta slowing left hemisphere.  Hyperventilation and photic stimulation were not performed.     ABNORMALITY - Continuous slow, right hemisphere - Intermittent slow, left hemisphere  IMPRESSION: This study is suggestive of cortical dysfunction in right hemisphere likely secondary to underlying structural abnormality/stroke.  Additionally there is evidence of nonspecific cortical dysfunction in left hemisphere which could be secondary to underlying strokes as well.  No seizures or epileptiform discharges were seen throughout the recording.  Jade [name] 

## 2021-09-21 NOTE — Progress Notes (Signed)
Occupational Therapy Treatment Patient Details Name: Jade [name] MRN: [id number] DOB: 1956-03-23 Today's Date: 09/21/2021   History of present illness Pt is a 65 y/o female who presented 09/16/21 with poor visual tracking, incoordination, blurred vision, and leaning to L. MRI revealed patchy acute ischemic infarcts involving the right parieto-occipital region, posterior left splenium and adjacent subcortical left occipital lobe. Worsening neuro symptoms 1/25 and repeat imaging revealed large bilateral new acute infarcts in the bilateral temporal lobes, bilateral occipital lobes, and right parietal lobe. PMH significant for CKD, COPD, CVAs with R hemiparesis, fibromyalgia, gout, HTN, migraine, OSA, DM2   OT comments  Patient received in recliner demonstrating good sitting positioning. AAROM  performed to RUE while seated in recliner with patient demonstrating pain and discomfort.  Patient performed trunk flexion with min assist to increase trunk strength to assist with transfers. Patient stated she was tired from multiple test last night.  Patient had difficulty following commands for reaching tasks with LUE.  Acute OT to continue to follow.    Recommendations for follow up therapy are one component of a multi-disciplinary discharge planning process, led by the attending physician.  Recommendations may be updated based on patient status, additional functional criteria and insurance authorization.    Follow Up Recommendations  Skilled nursing-short term rehab (<3 hours/day)    Assistance Recommended at Discharge Frequent or constant Supervision/Assistance  Patient can return home with the following  A lot of help with walking and/or transfers;A lot of help with bathing/dressing/bathroom;Assistance with cooking/housework;Assistance with feeding;Direct supervision/assist for medications management;Help with stairs or ramp for entrance;Assist for transportation   Equipment Recommendations  Other  (comment) (TBD)    Recommendations for Other Services      Precautions / Restrictions Precautions Precautions: Fall Precaution Comments: Decreased vision on the L, prior stroke with R side weakness Restrictions Weight Bearing Restrictions: No       Mobility Bed Mobility               General bed mobility comments: seated in recliner upon arrival    Transfers                         Balance       Sitting balance - Comments: performed trunk flexion seated in recliner with min assist                                   ADL either performed or assessed with clinical judgement   ADL                                              Extremity/Trunk Assessment Upper Extremity Assessment RUE Deficits / Details: Residual weakness and painful ROM, decreased sensation. hand in fisted posturing upon arrival, pt is able to actively go into full extension with increasaed time/effort. Pt wears a night  time spint. RUE Sensation: decreased light touch;decreased proprioception RUE Coordination: decreased fine motor;decreased gross motor LUE Deficits / Details: overall WFL, generalized weakness and limited overhead ROM.            Vision   Vision Assessment?: Vision impaired- to be further tested in functional context Additional Comments: difficulty scanning and locating objects in space   Perception     Praxis  Cognition                                       General Comments: difficulty following directions        Exercises Exercises: General Upper Extremity General Exercises - Upper Extremity Shoulder Flexion: PROM, Right, 10 reps, Seated Shoulder Extension: PROM, Right, 10 reps, Seated Shoulder ABduction: AAROM, Right, 10 reps, Seated Elbow Flexion: AAROM, Right, 10 reps, Seated Elbow Extension: AAROM, Right, 10 reps, Seated Wrist Flexion: AAROM, Right, 10 reps, Seated Wrist Extension: AAROM, Right,  10 reps, Seated Digit Composite Flexion: AROM, Right, 10 reps, Seated    Shoulder Instructions       General Comments      Pertinent Vitals/ Pain          Home Living                                          Prior Functioning/Environment              Frequency  Min 2X/week        Progress Toward Goals  OT Goals(current goals can now be found in the care plan section)  Progress towards OT goals: Progressing toward goals  Acute Rehab OT Goals Patient Stated Goal: get better OT Goal Formulation: With patient Time For Goal Achievement: 10/02/21 Potential to Achieve Goals: Good ADL Goals Pt Will Perform Upper Body Dressing: with set-up;sitting Pt Will Perform Lower Body Dressing: with modified independence;with set-up;sit to/from stand Pt Will Transfer to Toilet: with modified independence;stand pivot transfer;bedside commode Additional ADL Goal #1: Pt will indep complete bed mobility as a precursor to ADLs  Plan Discharge plan remains appropriate    Co-evaluation                 AM-PAC OT "6 Clicks" Daily Activity     Outcome Measure   Help from another person eating meals?: A Little Help from another person taking care of personal grooming?: A Little Help from another person toileting, which includes using toliet, bedpan, or urinal?: A Lot Help from another person bathing (including washing, rinsing, drying)?: A Lot Help from another person to put on and taking off regular upper body clothing?: A Lot Help from another person to put on and taking off regular lower body clothing?: A Lot 6 Click Score: 14    End of Session    OT Visit Diagnosis: Unsteadiness on feet (R26.81);Other abnormalities of gait and mobility (R26.89);Muscle weakness (generalized) (M62.81);History of falling (Z91.81);Pain   Activity Tolerance Patient limited by fatigue   Patient Left in chair;with call bell/phone within reach;with chair alarm set;with  family/visitor present   Nurse Communication Other (comment) (difficulty following directions)        Time: 6378-5885 OT Time Calculation (min): 26 min  Charges: OT General Charges $OT Visit: 1 Visit OT Treatments $Therapeutic Exercise: 23-37 mins  Alfonse Flavors, OTA Acute Rehabilitation Services  Pager [phone] Office [phone]   Dewain Penning 09/21/2021, 2:41 PM

## 2021-09-21 NOTE — Progress Notes (Signed)
Physical Therapy Treatment Patient Details Name: Jade [name] MRN: [id number] DOB: 1955/10/26 Today's Date: 09/21/2021   History of Present Illness Pt is a 66 y/o female who presented 09/16/21 with poor visual tracking, incoordination, blurred vision, and leaning to L. MRI revealed patchy acute ischemic infarcts involving the right parieto-occipital region, posterior left splenium and adjacent subcortical left occipital lobe. Worsening neuro symptoms 1/25 and repeat imaging revealed large bilateral new acute infarcts in the bilateral temporal lobes, bilateral occipital lobes, and right parietal lobe. PMH significant for CKD, COPD, CVAs with R hemiparesis, fibromyalgia, gout, HTN, migraine, OSA, DM2    PT Comments    Pt now with new acute infarcts as of 1/25 as noted above. Overall, function not significantly decreased compared to initial evaluation. Acute PT goals and d/c to SNF level rehab remain appropriate. Stedy utilized today for transition from [employer]. Pt required +2 assist for all aspects of mobility, and varying from max assist to min guard assist for sitting balance. Pt with heavy L lateral lean but able to improve with time and adjustment of LUE position for stability. Will continue to follow and progress as able per POC.   Recommendations for follow up therapy are one component of a multi-disciplinary discharge planning process, led by the attending physician.  Recommendations may be updated based on patient status, additional functional criteria and insurance authorization.  Follow Up Recommendations  Skilled nursing-short term rehab (<3 hours/day)     Assistance Recommended at Discharge Frequent or constant Supervision/Assistance  Patient can return home with the following Two people to help with walking and/or transfers;Two people to help with bathing/dressing/bathroom   Equipment Recommendations  Other (comment) (TBD by next venue of care)    Recommendations for Other  Services       Precautions / Restrictions Precautions Precautions: Fall Precaution Comments: Decreased vision on the L, prior stroke with R side weakness Restrictions Weight Bearing Restrictions: No     Mobility  Bed Mobility Overal bed mobility: Needs Assistance Bed Mobility: Supine to Sit     Supine to sit: Max assist, +2 for physical assistance, HOB elevated     General bed mobility comments: Use of bed pad and +2 assist required for transition to EOB. Heavy L lateral lean initially improving with LUE placement on bed and increased time to find midline.    Transfers Overall transfer level: Needs assistance Equipment used: Ambulation equipment used Transfers: Sit to/from Stand, Bed to chair/wheelchair/BSC Sit to Stand: Mod assist, +2 physical assistance, From elevated surface           General transfer comment: +2 assist for power up to full stand in [place]. Pt required increased assist posteriorly for anterior lean so flaps of Stedy could be lowered into place under pt's hips. Pt with L lateral lean in standing but with good hand placement on center bar.    Ambulation/Gait               General Gait Details: Unable to attempt ambulation at this time.   Stairs             Wheelchair Mobility    Modified Rankin (Stroke Patients Only) Modified Rankin (Stroke Patients Only) Pre-Morbid Rankin Score: Moderately severe disability Modified Rankin: Severe disability     Balance Overall balance assessment: Needs assistance Sitting-balance support: Feet supported, No upper extremity supported Sitting balance-Leahy Scale: Poor Sitting balance - Comments: Varying levels of assist required for pt to maintain sitting balance at EOB. Max assist progressing  to min guard assist. Postural control: Left lateral lean Standing balance support: Bilateral upper extremity supported Standing balance-Leahy Scale: Zero Standing balance comment: +2 required                             Cognition Arousal/Alertness: Awake/alert Behavior During Therapy: Flat affect Overall Cognitive Status: Impaired/Different from baseline Area of Impairment: Safety/judgement, Following commands, Problem solving                       Following Commands: Follows one step commands consistently, Follows one step commands with increased time Safety/Judgement: Decreased awareness of deficits, Decreased awareness of safety   Problem Solving: Slow processing, Decreased initiation, Difficulty sequencing, Requires tactile cues, Requires verbal cues          Exercises      General Comments        Pertinent Vitals/Pain Pain Assessment Pain Assessment: Faces Faces Pain Scale: Hurts little more Pain Location: R UE, RLE with movement Pain Descriptors / Indicators: Grimacing, Moaning Pain Intervention(s): Limited activity within patient's tolerance, Monitored during session, Repositioned    Home Living                          Prior Function            PT Goals (current goals can now be found in the care plan section) Acute Rehab PT Goals Patient Stated Goal: None stated this session. PT Goal Formulation: With patient/family Time For Goal Achievement: 10/02/21 Potential to Achieve Goals: Fair Progress towards PT goals: Progressing toward goals    Frequency    Min 3X/week      PT Plan      Co-evaluation              AM-PAC PT "6 Clicks" Mobility   Outcome Measure  Help needed turning from your back to your side while in a flat bed without using bedrails?: Total Help needed moving from lying on your back to sitting on the side of a flat bed without using bedrails?: Total Help needed moving to and from a bed to a chair (including a wheelchair)?: Total Help needed standing up from a chair using your arms (e.g., wheelchair or bedside chair)?: Total Help needed to walk in hospital room?: Total Help needed climbing 3-5  steps with a railing? : Total 6 Click Score: 6    End of Session Equipment Utilized During Treatment: Gait belt Activity Tolerance: Patient limited by fatigue (weakness) Patient left: in chair;with call bell/phone within reach;with chair alarm set;with family/visitor present Nurse Communication: Mobility status;Need for lift equipment (Stedy recommended for back to bed) PT Visit Diagnosis: Unsteadiness on feet (R26.81);Difficulty in walking, not elsewhere classified (R26.2);Other symptoms and signs involving the nervous system (R29.898);Other abnormalities of gait and mobility (R26.89)     Time: [id number] PT Time Calculation (min) (ACUTE ONLY): 23 min  Charges:  $Therapeutic Activity: 23-37 mins                     Rolinda Roan, PT, DPT Acute Rehabilitation Services Pager: [phone] Office: [phone]    Thelma Comp 09/21/2021, 2:12 PM

## 2021-09-21 NOTE — Plan of Care (Signed)
Pt is alert but unable to see. Pt states sometimes she can see shadows. Pt c/o pian to right arm, prn tylenol given per order, effective. No distress noted. Pt does look pale this morning. Pt refused labs, provider messaged to inform this morning. Pt has purewick in place.    Problem: Education: Goal: Knowledge of General Education information will improve Description: Including pain rating scale, medication(s)/side effects and non-pharmacologic comfort measures Outcome: Progressing   Problem: Health Behavior/Discharge Planning: Goal: Ability to manage health-related needs will improve Outcome: Progressing   Problem: Clinical Measurements: Goal: Ability to maintain clinical measurements within normal limits will improve Outcome: Progressing Goal: Will remain free from infection Outcome: Progressing Goal: Diagnostic test results will improve Outcome: Progressing Goal: Respiratory complications will improve Outcome: Progressing Goal: Cardiovascular complication will be avoided Outcome: Progressing   Problem: Activity: Goal: Risk for activity intolerance will decrease Outcome: Progressing   Problem: Nutrition: Goal: Adequate nutrition will be maintained Outcome: Progressing   Problem: Coping: Goal: Level of anxiety will decrease Outcome: Progressing   Problem: Elimination: Goal: Will not experience complications related to bowel motility Outcome: Progressing Goal: Will not experience complications related to urinary retention Outcome: Progressing   Problem: Pain Managment: Goal: General experience of comfort will improve Outcome: Progressing   Problem: Safety: Goal: Ability to remain free from injury will improve Outcome: Progressing   Problem: Skin Integrity: Goal: Risk for impaired skin integrity will decrease Outcome: Progressing   Problem: Education: Goal: Knowledge of disease or condition will improve Outcome: Progressing Goal: Knowledge of secondary  prevention will improve (SELECT ALL) Outcome: Progressing Goal: Knowledge of patient specific risk factors will improve (INDIVIDUALIZE FOR PATIENT) Outcome: Progressing Goal: Individualized Educational Video(s) Outcome: Progressing   Problem: Coping: Goal: Will verbalize positive feelings about self Outcome: Progressing Goal: Will identify appropriate support needs Outcome: Progressing   Problem: Health Behavior/Discharge Planning: Goal: Ability to manage health-related needs will improve Outcome: Progressing   Problem: Self-Care: Goal: Ability to participate in self-care as condition permits will improve Outcome: Progressing Goal: Verbalization of feelings and concerns over difficulty with self-care will improve Outcome: Progressing Goal: Ability to communicate needs accurately will improve Outcome: Progressing   Problem: Nutrition: Goal: Risk of aspiration will decrease Outcome: Progressing Goal: Dietary intake will improve Outcome: Progressing   Problem: Intracerebral Hemorrhage Tissue Perfusion: Goal: Complications of Intracerebral Hemorrhage will be minimized Outcome: Progressing   Problem: Ischemic Stroke/TIA Tissue Perfusion: Goal: Complications of ischemic stroke/TIA will be minimized Outcome: Progressing   Problem: Spontaneous Subarachnoid Hemorrhage Tissue Perfusion: Goal: Complications of Spontaneous Subarachnoid Hemorrhage will be minimized Outcome: Progressing

## 2021-09-21 NOTE — Progress Notes (Addendum)
STROKE TEAM PROGRESS NOTE   INTERVAL HISTORY No family at bedside. Patient easily arousable but unable to see out of both eyes. Patient oriented to self but not location, time, or context.  MRI limited by motion but did show large new acute infarct in bilateral temporal lobes, bilateral occipital lobes, and right parietal lobe. MRA did not show new occlusion/stenosis.  Vitals:   09/21/21 0028 09/21/21 0335 09/21/21 0826 09/21/21 1213  BP: (!) 144/70 (!) 139/59 (!) 146/72 (!) 152/72  Pulse: 91 83 89 89  Resp: 15 19 13 20   Temp: 99.2 F (37.3 C) 98 F (36.7 C) 98.8 F (37.1 C) 99.3 F (37.4 C)  TempSrc: Axillary Axillary Oral Oral  SpO2: 99% 98% 100% 99%  Weight:      Height:       CBC:  Recent Labs  Lab 09/16/21 1423 09/16/21 2211 09/19/21 1537 09/20/21 0334  WBC 6.6   < > 10.2 12.3*  NEUTROABS 4.9  --   --   --   HGB 7.7*   < > 9.4* 9.0*  HCT 24.6*   < > 28.5* 27.9*  MCV 98.0   < > 93.1 93.0  PLT 372   < > 456* 464*   < > = values in this interval not displayed.    Basic Metabolic Panel:  Recent Labs  Lab 09/18/21 0731 09/19/21 0400  NA 141 137  K 3.1* 3.4*  CL 110 108  CO2 23 21*  GLUCOSE 152* 114*  BUN 10 10  CREATININE 0.76 0.84  CALCIUM 9.1 8.7*    Lipid Panel:  Recent Labs  Lab 09/17/21 0426  CHOL 126  TRIG 128  HDL 43  CHOLHDL 2.9  VLDL 26  LDLCALC 57    HgbA1c:  Recent Labs  Lab 09/17/21 0426  HGBA1C 5.7*    Urine Drug Screen:  Recent Labs  Lab 09/16/21 1423  LABOPIA NONE DETECTED  COCAINSCRNUR NONE DETECTED  LABBENZ NONE DETECTED  AMPHETMU NONE DETECTED  THCU NONE DETECTED  LABBARB NONE DETECTED     Alcohol Level  Recent Labs  Lab 09/16/21 1423  ETH <10     IMAGING past 24 hours CT HEAD WO CONTRAST (5MM)  Result Date: 09/20/2021 CLINICAL DATA:  Binocular visual loss, worsening mentation in vision. History of stroke, chronic kidney disease, COPD, hypertension, migraines, type II diabetes mellitus, former smoker  EXAM: CT HEAD WITHOUT CONTRAST TECHNIQUE: Contiguous axial images were obtained from the base of the skull through the vertex without intravenous contrast. RADIATION DOSE REDUCTION: This exam was performed according to the departmental dose-optimization program which includes automated exposure control, adjustment of the mA and/or kV according to patient size and/or use of iterative reconstruction technique. COMPARISON:  06/21/2021; correlation MR brain 09/16/2021 FINDINGS: Brain: Generalized atrophy. Normal ventricular morphology. No midline shift or mass effect. Extensive small vessel chronic ischemic changes of deep cerebral white matter. Large subacute infarcts identified posterior RIGHT parieto-occipital and LEFT occipital lobes. Old infarct posterior high LEFT parietal. Tiny old lacunar infarct RIGHT thalamus. No new areas of infarction or hemorrhage. Arachnoid cyst at anterior aspect of LEFT middle cranial fossa, 4.0 x 2.0 x 1.9 cm. No new extra-axial fluid collections. Vascular: No definite hyperdense vessels. Atherosclerotic calcification of internal carotid and vertebral arteries at skull base. Skull: Intact Sinuses/Orbits: Mucous versus fluid in sphenoid sinus. Remaining paranasal sinuses and mastoid air cells clear. Other: N/A IMPRESSION: Evolving subacute infarcts involving the LEFT occipital lobe and posterior RIGHT parieto-occipital lobe, both larger than  seen on recent MR. Atrophy with small vessel chronic ischemic changes of deep cerebral white matter. Old high LEFT parietal and RIGHT thalamic infarcts. Stable arachnoid cyst RIGHT middle cranial fossa. No intracranial hemorrhage. Findings called to Dr. Waldron Labs on 09/20/2021 at 1838 hours. Electronically Signed   By: Lavonia Dana M.D.   On: 09/20/2021 18:39   MR ANGIO HEAD WO CONTRAST  Result Date: 09/20/2021 CLINICAL DATA:  Stroke, follow-up EXAM: MRI HEAD WITHOUT CONTRAST MRA HEAD WITHOUT CONTRAST TECHNIQUE: Multiplanar, multi-echo pulse  sequences of the brain and surrounding structures were acquired without intravenous contrast. Angiographic images of the Circle of Willis were acquired using MRA technique without intravenous contrast. COMPARISON:  09/16/2021 MRI head, correlation is also made with CT head 09/20/2021 and CTA 09/16/2021 FINDINGS: Exam somewhat limited by motion and early termination at patient request. MRI HEAD FINDINGS Brain: New large confluent areas of restricted diffusion with ADC correlates in the right sylvian fissure (series 4, image 27), posterior greater than anterior right temporal lobe (series 4, image 28), right parietal lobe (series 4, image 39), right occipital lobe (series 4, image 22), left occipital lobe (series 4, image 17), and posterior left hippocampus and medial temporal lobe (series 4, image 23). Additional more isolated areas of restricted diffusion in the left splenium of the corpus callosum (series 4, image 29), which was seen on the prior exam, and lateral left temporal lobe (series 4, image 18), which was not. These areas are associated with increased T2 signal and gyral swelling. Some of these areas previously demonstrated restricted diffusion on 09/16/2021, but not to this extent. No acute hemorrhage, mass, mass effect, or midline shift. Unchanged size and configuration of the ventricles. Advanced cerebral atrophy for age. Confluent T2 hyperintense signal in the periventricular white matter and pons, likely the sequela of severe chronic small vessel ischemic disease. Chronic hemosiderin deposition at the left anterior corpus callosum is again noted. Right middle cranial fossa arachnoid cyst. Vascular: Please see MRA findings below. Skull and upper cervical spine: Normal marrow signal. Sinuses/Orbits: Air-fluid level in the left sphenoid sinus, similar to the prior exam. Otherwise negative. Other: The mastoids are well aerated. MRA HEAD FINDINGS Anterior circulation: Both internal carotid arteries are  patent to the termini, without stenosis or other abnormality. A1 segments patent. Normal anterior communicating artery. Anterior cerebral arteries are patent to their distal aspects. No M1 stenosis or occlusion. Normal MCA bifurcations. Focal narrowing in bilateral M2 branches, unchanged from the prior exam. Distal MCA branches otherwise perfused and symmetric. Posterior circulation: Vertebral arteries patent to the vertebrobasilar junction without stenosis. Basilar patent to its distal aspect. Superior cerebellar arteries patent bilaterally. Normal P1 segments. Redemonstrated severe stenosis in the bilateral P2 segments, which is near occlusive bilaterally. Anatomic variants: None significant IMPRESSION: 1. Exam is somewhat limited by motion and early termination. 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 Given multiple vascular territories, an embolic etiology is suspected. 2. Redemonstrated bilateral M2 and P2 severe stenosis. No new occlusion or significant stenosis. These results will be called to the ordering clinician or representative by the Radiologist Assistant, and communication documented in the PACS or [employer]. Electronically Signed   By: Merilyn Baba M.D.   On: 09/20/2021 21:47   MR BRAIN WO CONTRAST  Result Date: 09/20/2021 CLINICAL DATA:  Stroke, follow-up EXAM: MRI HEAD WITHOUT CONTRAST MRA HEAD WITHOUT CONTRAST TECHNIQUE: Multiplanar, multi-echo pulse sequences of the brain and  surrounding structures were acquired without intravenous contrast. Angiographic images of the Circle of Willis were acquired using MRA technique without intravenous contrast. COMPARISON:  09/16/2021 MRI head, correlation is also made with CT head 09/20/2021 and CTA 09/16/2021 FINDINGS: Exam somewhat limited by motion and early termination at patient request. MRI HEAD FINDINGS Brain: New large  confluent areas of restricted diffusion with ADC correlates in the right sylvian fissure (series 4, image 27), posterior greater than anterior right temporal lobe (series 4, image 28), right parietal lobe (series 4, image 39), right occipital lobe (series 4, image 22), left occipital lobe (series 4, image 17), and posterior left hippocampus and medial temporal lobe (series 4, image 23). Additional more isolated areas of restricted diffusion in the left splenium of the corpus callosum (series 4, image 29), which was seen on the prior exam, and lateral left temporal lobe (series 4, image 18), which was not. These areas are associated with increased T2 signal and gyral swelling. Some of these areas previously demonstrated restricted diffusion on 09/16/2021, but not to this extent. No acute hemorrhage, mass, mass effect, or midline shift. Unchanged size and configuration of the ventricles. Advanced cerebral atrophy for age. Confluent T2 hyperintense signal in the periventricular white matter and pons, likely the sequela of severe chronic small vessel ischemic disease. Chronic hemosiderin deposition at the left anterior corpus callosum is again noted. Right middle cranial fossa arachnoid cyst. Vascular: Please see MRA findings below. Skull and upper cervical spine: Normal marrow signal. Sinuses/Orbits: Air-fluid level in the left sphenoid sinus, similar to the prior exam. Otherwise negative. Other: The mastoids are well aerated. MRA HEAD FINDINGS Anterior circulation: Both internal carotid arteries are patent to the termini, without stenosis or other abnormality. A1 segments patent. Normal anterior communicating artery. Anterior cerebral arteries are patent to their distal aspects. No M1 stenosis or occlusion. Normal MCA bifurcations. Focal narrowing in bilateral M2 branches, unchanged from the prior exam. Distal MCA branches otherwise perfused and symmetric. Posterior circulation: Vertebral arteries patent to the  vertebrobasilar junction without stenosis. Basilar patent to its distal aspect. Superior cerebellar arteries patent bilaterally. Normal P1 segments. Redemonstrated severe stenosis in the bilateral P2 segments, which is near occlusive bilaterally. Anatomic variants: None significant IMPRESSION: 1. Exam is somewhat limited by motion and early termination. 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 Given multiple vascular territories, an embolic etiology is suspected. 2. Redemonstrated bilateral M2 and P2 severe stenosis. No new occlusion or significant stenosis. These results will be called to the ordering clinician or representative by the Radiologist Assistant, and communication documented in the PACS or [employer]. Electronically Signed   By: Merilyn Baba M.D.   On: 09/20/2021 21:47   EEG adult  Result Date: 09/21/2021 Lora Havens, MD     09/21/2021  9:48 AM Patient Name: Jade [name] MRN: [id number] Epilepsy Attending: Lora Havens Referring Physician/Provider: Albertine Patricia, MD Date: 09/21/2021 Duration: 22.28 mins Patient history: 66 year old female with vision changes and confusion.  EEG to evaluate for seizures. Level of alertness: Awake, asleep AEDs during EEG study: None Technical aspects: This EEG study was done with scalp electrodes positioned according to the 10-20 International system of electrode placement. Electrical activity was acquired at a sampling rate of 500Hz  and reviewed with a high frequency filter of 70Hz  and a low frequency filter of 1Hz . EEG data were recorded continuously and digitally stored. Description: The posterior  dominant rhythm consists of 8 Hz activity of moderate voltage (25-35 uV) seen predominantly in posterior head regions, asymmetric ( right<left) and reactive to eye opening and eye closing. Sleep was characterized by vertex waves, sleep  spindles (12 to 14 Hz), maximal frontocentral region.  EEG showed continuous 3 to 5 Hz theta-delta slowing in right hemisphere as well as intermittent 3 to 5 Hz theta-delta slowing left hemisphere.  Hyperventilation and photic stimulation were not performed.   ABNORMALITY - Continuous slow, right hemisphere - Intermittent slow, left hemisphere IMPRESSION: This study is suggestive of cortical dysfunction in right hemisphere likely secondary to underlying structural abnormality/stroke.  Additionally there is evidence of nonspecific cortical dysfunction in left hemisphere which could be secondary to underlying strokes as well.  No seizures or epileptiform discharges were seen throughout the recording. Jade [name]    PHYSICAL EXAM  Temp:  [98 F (36.7 C)-99.7 F (37.6 C)] 99.3 F (37.4 C) (01/26 1213) Pulse Rate:  [83-100] 89 (01/26 1213) Resp:  [13-20] 20 (01/26 1213) BP: (139-163)/(48-72) 152/72 (01/26 1213) SpO2:  [98 %-100 %] 99 % (01/26 1213)  General - ill-appearing, fatigued caucasian female but in no acute distress.  Cardiovascular - Regular rhythm and rate.  Neuro - awake, alert, eyes open, orientated to self only. No aphasia, paucity of speech, following most simple commands. Able to name "nose" "ear" and able to repeat. No gaze palsy, not tracking bilaterally, bilateral vision loss in the setting of previous left hemianopia. No facial droop. Tongue midline. LUE 5/5, RUE 3-/5 proximal and distal 0/5 which is chronic. LLE 3/5 proximal and 4/5 distal. RLE 3/5 proximal and 0/5 distally. Sensation symmetrical bilaterally, FTN intact on the left, gait not tested.    ASSESSMENT/PLAN Ms. MCKELL SCIASCIA is a 66 y.o. female with history of HTN, T2DM, CVD, multiple strokes since July 2022, HLD, L carotid stenosis s/p stent presenting with altered mental status and blurry vision.   Stroke - left PCA and right MCA large infarcts, likely due to b/l PCA and MCA stenosis in the setting of  severe anemia and holding off antiplatelet  Progressive bilateral vision loss with disorientation  1/21 CTA head & neck Stable with no evidence of LVO or emergent findings, advanced intracranial atherosclerotic disease, L stable patent carotid stent, R ICA <50% stenosis 1/21 MRI  Patchy acute ischemic infarcts involving the right parieto-occipital region, additional small volume acute to early subacute ischemic infarcts involving the posterior left splenium and adjacent subcortical left occipital lobe, underlying age-related cerebral atrophy with advanced chronic microvascular ischemic disease 1/25 CT - evolving subacute infarct in L occipital and R posterior parieto-occipital lobe 1/25 MRI - large bilateral new infarcts in temporal lobes and bilateral occipital lobes as well as right parietal lobes.  EEG no seizure 2D Echo EF 60-65%  LDL 57 HgbA1c 5.7 Now on ASA + Plavix after GI clearance VTE prophylaxis - SCD Therapy recommendations:  SNF Disposition:  pending  Hx of stroke 05/2021 admitted for generalized weakness.  MRI showed subacute bilateral corpus callosum infarcts due to bilateral PCA known high-grade stenosis.  CTA head and neck showed severe stenosis bilateral P2 and bilateral MCA branch vessels.  Repeat MRI showed 3 total new punctate infarcts at the bilateral hippocampi and left parieto-occipital lobe, consistent with high-grade PCA stenosis in the setting of hypotension.  EF initial report 25 to 30%, however later corrected to 40 to 45%.  LDL 133, A1c 5.7.  Patient was put on aspirin and Brilinta for 30  days and then back to aspirin and Plavix.  Patient discharged to [facility]. 03/2021 admitted for right-sided weakness.  CT no acute abnormality.  CTA head and neck showed 75% stenosis of proximal left ICA.  Bilateral PCA high-grade stenosis.  MRI showed small acute infarct at the left frontoparietal junction.  Additional small infarct adjacent to the frontal horn of the left lateral  ventricle.  Status post left TCAR 04/06/2021.  Postprocedure increase right leg weakness.  CT head and neck unchanged except interval left ICA stent, patent with 45% stenosis.  MRI on 8/12 showed interval enlargement/expansion of the previous left frontoparietal infarct, likely due to periprocedural fluctuating BP.  LDL 78, A1c 6.5.  Discharged with aspirin 325 and Plavix 75 for 3 months and then Plavix alone. Admitted in 02/2021 for blurry vision and hypertension, MRI showed left thalamic small infarcts.  CTA head and neck showed left ICA 75% stenosis.  MRA head showed severe distal right M2, and right P2 stenosis.  A1c 6.4, LDL 184.  EF 55 to 60%, creatinine 1.10.  She was discharged with aspirin 81 Plavix 75 DAPT for 3 weeks and then Plavix alone, as well as Lipitor 80. Admitted 02/2018 with left thalamic/internal capsule infarct.  EF 60 to 65%, LDL 130, A1c 5.8.  CT head and neck at that time left ICA 80% stenosis.  Referred her to vascular surgery and was planning to do left CEA, however patient canceled the procedure herself.  She then followed with her PCP and referred to see a neurologist Dr. Mikeal Hawthorne in [place], had carotid Doppler on 02/20/2021 which reported as unremarkable.   Acute severe anemia Hemoglobin baseline 12-14.  However, on presentation she was found hemoglobin 5.9->5.4.  Received PRBC transfusion.  Hemoglobin improved to 9-10. GI on board, had EGD today showed none bleeding duodenal ulcer.  Cleared by GI for aspirin Plavix CBC monitoring  Hypertension Home meds:  norvasc Stable Avoid low BP Long-term BP goal 130-150 given advanced intracranial athero  Hyperlipidemia Home meds:  lipitor 80, resumed in hospital LDL 57, goal < 70 On lipitor 80 mg  Continue statin at discharge  Diabetes type II Controlled Home meds: none HgbA1c 5.7, goal < 7.0 CBGs SSI  Other Stroke Risk Factors Advanced Age >/= 77  Former Cigarette smoker  Other Active Problems Cognitive impairment -  likely vascular dementia  Hospital day # [place] PGY1 Resident 09/21/2021 12:24 PM  ATTENDING NOTE: I reviewed above note and agree with the assessment and plan. Pt was seen and examined.   No family at bedside.  Patient sitting in bed, still has bilateral vision loss, able to track sound bilaterally but not blinking to visual threat bilaterally.  Still has left hemiparesis, but no aphasia able to name and repeat.  Follows simple commands.  However not orientated to place, time and age.  CT yesterday showed large left PCA and right MCA infarct.  MRI confirmed the same.  Etiology likely due to bilateral PCA and MCA stenosis in the setting of severe anemia and holding off antiplatelet.  Put her back on aspirin and Plavix after GI clearance.  Continue statin with Lipitor 80.  PT/OT recommend SNF.  We will follow.  For detailed assessment and plan, please refer to above as I have made changes wherever appropriate.   [name], MD PhD Stroke Neurology 09/21/2021 5:16 PM       To contact Stroke Continuity provider, please refer to [email or web address] After hours, contact General Neurology

## 2021-09-21 NOTE — Progress Notes (Signed)
Pt is alert oriented x 3, pt refused blood work this AM x 2. Pt stated she was not ready.

## 2021-09-22 DIAGNOSIS — K269 Duodenal ulcer, unspecified as acute or chronic, without hemorrhage or perforation: Secondary | ICD-10-CM

## 2021-09-22 LAB — BASIC METABOLIC PANEL
Anion gap: 11 (ref 5–15)
BUN: 15 mg/dL (ref 8–23)
CO2: 20 mmol/L — ABNORMAL LOW (ref 22–32)
Calcium: 8.9 mg/dL (ref 8.9–10.3)
Chloride: 105 mmol/L (ref 98–111)
Creatinine, Ser: 0.96 mg/dL (ref 0.44–1.00)
GFR, Estimated: >60 mL/min (ref >60)
Glucose, Bld: 129 mg/dL — ABNORMAL HIGH (ref 70–99)
Potassium: 4 mmol/L (ref 3.5–5.1)
Sodium: 136 mmol/L (ref 135–145)

## 2021-09-22 LAB — CBC
HCT: 28.6 % — ABNORMAL LOW (ref 36.0–46.0)
Hemoglobin: 9.4 g/dL — ABNORMAL LOW (ref 12.0–15.0)
MCH: 30.3 pg (ref 26.0–34.0)
MCHC: 32.9 g/dL (ref 30.0–36.0)
MCV: 92.3 fL (ref 80.0–100.0)
Platelets: 415 10*3/uL — ABNORMAL HIGH (ref 150–400)
RBC: 3.1 MIL/uL — ABNORMAL LOW (ref 3.87–5.11)
RDW: 14.6 % (ref 11.5–15.5)
WBC: 10.1 10*3/uL (ref 4.0–10.5)
nRBC: 0 % (ref 0.0–0.2)

## 2021-09-22 MED ORDER — ACETAMINOPHEN 325 MG PO TABS: 650.0000 mg | ORAL_TABLET | Freq: Four times a day (QID) | ORAL | Status: AC | PRN

## 2021-09-22 MED ORDER — PANTOPRAZOLE SODIUM 40 MG PO TBEC
40.0000 mg | DELAYED_RELEASE_TABLET | Freq: Two times a day (BID) | ORAL | 1 refills | Status: AC
Start: 2021-09-22 — End: ?

## 2021-09-22 NOTE — Discharge Summary (Addendum)
Physician Discharge Summary  Jade [name] [id number] DOB: [birth year] DOA: 09/16/2021  PCP: Sue Lush, PA-C  Admit date: 09/16/2021 Discharge date: 09/22/2021  Admitted From: Home Disposition:  SNF  Recommendations for Outpatient Follow-up:  Please obtain BMP/CBC in one week To continue with Protonix 40 mg p.o. twice daily for 12 weeks, then once daily   Discharge Condition:Stable.  But overall long-term prognosis is poor given recurrent CVAs in due to significant vessel disease. CODE STATUS:DNR Diet recommendation: Heart Healthy   Brief/Interim Summary:  Jade [name] is a 66 year old female with a history of CVA, left carotid stenosis status post stent, hypertension who presents to the ED with blurred vision for a few days. MRI revealed multifocal posterior circulation infarcts felt to be embolic per neurology Also noted to have a hemoglobin around 7 which is a major drop from the 12-15 range about 1 month ago.  She initially declined a blood transfusion.  She was seen by GI, went for endoscopy 1/25, which was significant for nonbleeding duodenal ulcer and tendinitis, cleared to resume her aspirin and Plavix again, unfortunately patient focal neurological deficits continue to worsen and, repeat CT on 1/25, showing progression and extension of her bilateral infarcts.    Acute arterial ischemic stroke, multifocal, posterior circulation, unspecified laterality (HCC) -per Neuro, possibly watershed infarcts due to significant drop in hemoglobin in the setting of severe stenosis - was discharged home on aspirin and Plavix on 06/30/2021 with a plan to continue both for 2 months and then Plavix alone -Patient with worsening vision 1/25, repeat CT showing progression of her infarction, she is currently back on aspirin and Plavix, after she is cleared by ED GI, as well she is on statin , avoid hypotension, continue with PT / OT.  Plan for subacute rehab.     Acute anemia,  normocytic, duodenitis and duodenal ulcer. - She is not aware of any bleeding no signs of bleeding in the hospital. -Hemoglobin of 7 on admission, dropped to 5.3, she was transfused 2 units PRBC, hemoglobin remained stable since. -Hematology input appreciated, no evidence of hemolysis. -GI input appreciated, s/p endoscopy 1/25 significant for erythematous duodenopathy and nonbleeding duodenal ulcer, recommendation to continue with Protonix 40 mg twice daily x10 weeks then daily.  Avoid NSAIDs as possible(continue with aspirin and Plavix) -No Helicobacter pylori identified   Fever - temp 101 on 1/22 - no infectious source- ? Drug reaction to Dantrolene   HTN - cont Amlodipine which is a home med,  Long-term BP goal 130-150 given advanced intracranial athero       Wheelchair-bound - Mostly wheelchair-bound- lives with her ex- husband -son lives nearby- PT recommends SNF   Glucose intolerance - A1c 5.7 Discharge Diagnoses:  Principal Problem:   Acute arterial ischemic stroke, multifocal, posterior circulation, unspecified laterality ([place]) Active Problems:   Diabetes mellitus type 2 with neurological manifestations ([place])   Pure hypercholesterolemia   Essential hypertension, benign   Anemia   Duodenal ulcer    Discharge Instructions  Discharge Instructions     Ambulatory referral to Neurology   Complete by: As directed    Follow up with Dr. Leonie Man at [facility] in 4-6 weeks. Too complicated for RN to follow. Thanks.   Ambulatory referral to Neurology   Complete by: As directed    An appointment is requested in approximately: 4 weeks   Diet - low sodium heart healthy   Complete by: As directed    Discharge instructions   Complete by: As directed  Follow with Primary MD Sue Lush, PA-C/ SNF physician  Get CBC, CMP,  checked  by Primary MD next visit.    Activity: As tolerated with Full fall precautions use walker/cane & assistance as needed   Disposition  SNF   Diet: Heart Healthy , with feeding assistance and aspiration precautions.  For Heart failure patients - Check your Weight same time everyday, if you gain over 2 pounds, or you develop in leg swelling, experience more shortness of breath or chest pain, call your Primary MD immediately. Follow Cardiac Low Salt Diet and 1.5 lit/day fluid restriction.   On your next visit with your primary care physician please Get Medicines reviewed and adjusted.   Please request your Prim.MD to go over all Hospital Tests and Procedure/Radiological results at the follow up, please get all Hospital records sent to your Prim MD by signing hospital release before you go home.   If you experience worsening of your admission symptoms, develop shortness of breath, life threatening emergency, suicidal or homicidal thoughts you must seek medical attention immediately by calling 911 or calling your MD immediately  if symptoms less severe.  You Must read complete instructions/literature along with all the possible adverse reactions/side effects for all the Medicines you take and that have been prescribed to you. Take any new Medicines after you have completely understood and accpet all the possible adverse reactions/side effects.   Do not drive, operating heavy machinery, perform activities at heights, swimming or participation in water activities or provide baby sitting services if your were admitted for syncope or siezures until you have seen by Primary MD or a Neurologist and advised to do so again.  Do not drive when taking Pain medications.    Do not take more than prescribed Pain, Sleep and Anxiety Medications  Special Instructions: If you have smoked or chewed Tobacco  in the last 2 yrs please stop smoking, stop any regular Alcohol  and or any Recreational drug use.  Wear Seat belts while driving.   Please note  You were cared for by a hospitalist during your hospital stay. If you have any questions  about your discharge medications or the care you received while you were in the hospital after you are discharged, you can call the unit and asked to speak with the hospitalist on call if the hospitalist that took care of you is not available. Once you are discharged, your primary care physician will handle any further medical issues. Please note that NO REFILLS for any discharge medications will be authorized once you are discharged, as it is imperative that you return to your primary care physician (or establish a relationship with a primary care physician if you do not have one) for your aftercare needs so that they can reassess your need for medications and monitor your lab values.   Increase activity slowly   Complete by: As directed       Allergies as of 09/22/2021       Reactions   Baclofen Other (See Comments)   Pt said felt weaker and had slurred speech   Nickel Dermatitis, Rash        Medication List     STOP taking these medications    dantrolene 50 MG capsule Commonly known as: DANTRIUM       TAKE these medications    acetaminophen 325 MG tablet Commonly known as: TYLENOL Take 2 tablets (650 mg total) by mouth every 6 (six) hours as needed for mild pain,  moderate pain, fever or headache (or temp > 37.5 C (99.5 F)). What changed:  medication strength how much to take when to take this reasons to take this   amLODipine 10 MG tablet Commonly known as: NORVASC Take 10 mg by mouth daily.   aspirin EC 81 MG tablet Take 81 mg by mouth daily. Swallow whole.   atorvastatin 80 MG tablet Commonly known as: LIPITOR Take 80 mg by mouth daily. What changed: Another medication with the same name was removed. Continue taking this medication, and follow the directions you see here.   B-complex with vitamin C tablet Take 1 tablet by mouth daily.   cholecalciferol 25 MCG (1000 UNIT) tablet Commonly known as: VITAMIN D3 Take 1,000 Units by mouth daily.   clopidogrel 75  MG tablet Commonly known as: PLAVIX Start taking after Brilinta is complete - Take 1 tablet (75 mg total) by mouth every morning. What changed: when to take this   loperamide 2 MG capsule Commonly known as: IMODIUM Take 2 capsules (4 mg total) by mouth 2 (two) times daily as needed for diarrhea or loose stools.   MULTIVITAMIN ADULTS 50+ PO Take 1 tablet by mouth daily.   pantoprazole 40 MG tablet Commonly known as: Protonix Take 1 tablet (40 mg total) by mouth 2 (two) times daily. Please take 40 mg oral twice daily x12 weeks, then take 40 mg oral once daily.   tiZANidine 2 MG tablet Commonly known as: ZANAFLEX Take 2 mg by mouth at bedtime.   ZINC PO Take 1 tablet by mouth daily.        Follow-up Information     Garvin Fila, MD. Schedule an appointment as soon as possible for a visit in 1 month(s).   Specialties: Neurology, Radiology Why: stroke clinic Contact information: [address] [place] [address] [phone]                Allergies  Allergen Reactions   Baclofen Other (See Comments)    Pt said felt weaker and had slurred speech   Nickel Dermatitis and Rash    Consultations: Neurology  Oncology Gastroenterology   Procedures/Studies: CT ABDOMEN PELVIS W WO CONTRAST  Result Date: 09/16/2021 CLINICAL DATA:  Progressive anemia, retroperitoneal hemorrhage EXAM: CT ABDOMEN AND PELVIS WITHOUT AND WITH CONTRAST TECHNIQUE: Multidetector CT imaging of the abdomen and pelvis was performed following the standard protocol before and following the bolus administration of intravenous contrast. RADIATION DOSE REDUCTION: This exam was performed according to the departmental dose-optimization program which includes automated exposure control, adjustment of the mA and/or kV according to patient size and/or use of iterative reconstruction technique. CONTRAST:  36mL OMNIPAQUE IOHEXOL 350 MG/ML SOLN, 191mL OMNIPAQUE IOHEXOL 300 MG/ML SOLN  COMPARISON:  09/22/2019 FINDINGS: Lower chest: No acute abnormality. Hepatobiliary: Status post cholecystectomy. Stable dystrophic calcifications within the gallbladder fossa. No intra or extrahepatic biliary ductal dilation. Liver unremarkable. Pancreas: Unremarkable Spleen: Unremarkable Adrenals/Urinary Tract: Adrenal glands are unremarkable. Kidneys are normal, without renal calculi, focal lesion, or hydronephrosis. Bladder is unremarkable. Stomach/Bowel: Stomach is within normal limits. Appendix appears normal. No evidence of bowel wall thickening, distention, or inflammatory changes. Vascular/Lymphatic: Aortic atherosclerosis. No enlarged abdominal or pelvic lymph nodes. Reproductive: Uterus and bilateral adnexa are unremarkable. Other: No retroperitoneal mass or hemorrhage identified. No abdominal wall hernia. The rectum is unremarkable. Musculoskeletal: No acute bone abnormality. Remote anterior wedge compression fractures of T12 and L1 are identified with minimal retropulsion. Remote superior endplate fracture of L4 noted. No  lytic or blastic bone lesion. IMPRESSION: No radiographic explanation for the patient's progressive anemia. No retroperitoneal hemorrhage identified. No acute intra-abdominal pathology identified. Aortic Atherosclerosis (ICD10-I70.0). Electronically Signed   By: Fidela Salisbury M.D.   On: 09/16/2021 23:51   CT ANGIO HEAD W OR WO CONTRAST  Result Date: 09/16/2021 CLINICAL DATA:  Follow-up examination for stroke. EXAM: CT ANGIOGRAPHY HEAD AND NECK TECHNIQUE: Multidetector CT imaging of the head and neck was performed using the standard protocol during bolus administration of intravenous contrast. Multiplanar CT image reconstructions and MIPs were obtained to evaluate the vascular anatomy. Carotid stenosis measurements (when applicable) are obtained utilizing NASCET criteria, using the distal internal carotid diameter as the denominator. RADIATION DOSE REDUCTION: This exam was performed  according to the departmental dose-optimization program which includes automated exposure control, adjustment of the mA and/or kV according to patient size and/or use of iterative reconstruction technique. CONTRAST:  16mL OMNIPAQUE IOHEXOL 350 MG/ML SOLN COMPARISON:  MRI from earlier the same day as well is multiple previous CTAs. FINDINGS: CT HEAD FINDINGS Brain: Scattered small volume acute to subacute ischemic infarcts better evaluated on prior MRI performed immediately prior on the same day. No intracranial hemorrhage. Chronic left posterior MCA distribution infarct. Underlying severe chronic microvascular ischemic disease. Right middle cranial fossa arachnoid cyst. No other mass lesion or mass effect. Stable ventricular size without hydrocephalus. No extra-axial collection. Vascular: No hyperdense vessel. Skull: Soft tissue protuberance at the scalp vertex, indeterminate, but similar to previous (series 5, image 71). Calvarium intact. Sinuses: Left sphenoid sinusitis. Mild mucosal thickening within the ethmoidal air cells and maxillary sinuses. Mastoid air cells are clear. Orbits: Right gaze noted. Review of the MIP images confirms the above findings CTA NECK FINDINGS Aortic arch: Visualized aortic arch normal in caliber with normal branch pattern. Moderate atheromatous change about the arch and origin of the great vessels without significant stenosis. Appearance is stable. Right carotid system: Right common and internal carotid arteries are tortuous. Eccentric calcified plaque at the origin of the cervical right ICA with less than 50% stenosis by NASCET criteria, stable. No dissection or other acute finding. Left carotid system: Left common and internal carotid arteries are tortuous. Vascular stent spans the left carotid bifurcation. Stable stent patency. No dissection or other acute finding. Vertebral arteries: Both vertebral arteries arise from the subclavian arteries. No progressive proximal subclavian  artery stenosis. Vertebral arteries remain patent without stenosis or dissection. Skeleton: No discrete or worrisome osseous lesions. Other neck: No other acute soft tissue abnormality within the neck. Upper chest: Unremarkable. Review of the MIP images confirms the above findings CTA HEAD FINDINGS Anterior circulation: Atheromatous change within the carotid siphons without significant or progressive stenosis. A1 segments and anterior communicating artery complex remain patent without abnormality. Atheromatous change throughout the ACAs without high-grade stenosis, stable. No new or progressive M1 stenosis. Severe atheromatous disease involving the bilateral MCA branches without proximal occlusion, relatively stable from prior. Posterior circulation: Atheromatous irregularity throughout the V4 segments without high-grade stenosis. Both PICA remain patent. Irregularity throughout the basilar without progressive stenosis. Superior cerebellar arteries remain patent at their origins. Both PCAs primarily supplied via the basilar. Advanced atheromatous disease involving the PCAs with associated severe bilateral P2 stenoses, stable. PCAs remain patent to their distal aspects. Venous sinuses: Patent allowing for timing the contrast bolus. Anatomic variants: None significant.  No aneurysm. Review of the MIP images confirms the above findings IMPRESSION: 1. Stable CTA of the head and neck, with no evidence for large vessel occlusion  or other emergent finding. 2. Advanced intracranial atherosclerotic disease, most pronounced at the bilateral into and P2 segments. 3. Stable patent left carotid stent. Stable less than 50% stenosis at the origin of the cervical right ICA. Electronically Signed   By: Jeannine Boga M.D.   On: 09/16/2021 22:36   CT HEAD WO CONTRAST (5MM)  Result Date: 09/20/2021 CLINICAL DATA:  Binocular visual loss, worsening mentation in vision. History of stroke, chronic kidney disease, COPD,  hypertension, migraines, type II diabetes mellitus, former smoker EXAM: CT HEAD WITHOUT CONTRAST TECHNIQUE: Contiguous axial images were obtained from the base of the skull through the vertex without intravenous contrast. RADIATION DOSE REDUCTION: This exam was performed according to the departmental dose-optimization program which includes automated exposure control, adjustment of the mA and/or kV according to patient size and/or use of iterative reconstruction technique. COMPARISON:  06/21/2021; correlation MR brain 09/16/2021 FINDINGS: Brain: Generalized atrophy. Normal ventricular morphology. No midline shift or mass effect. Extensive small vessel chronic ischemic changes of deep cerebral white matter. Large subacute infarcts identified posterior RIGHT parieto-occipital and LEFT occipital lobes. Old infarct posterior high LEFT parietal. Tiny old lacunar infarct RIGHT thalamus. No new areas of infarction or hemorrhage. Arachnoid cyst at anterior aspect of LEFT middle cranial fossa, 4.0 x 2.0 x 1.9 cm. No new extra-axial fluid collections. Vascular: No definite hyperdense vessels. Atherosclerotic calcification of internal carotid and vertebral arteries at skull base. Skull: Intact Sinuses/Orbits: Mucous versus fluid in sphenoid sinus. Remaining paranasal sinuses and mastoid air cells clear. Other: N/A IMPRESSION: Evolving subacute infarcts involving the LEFT occipital lobe and posterior RIGHT parieto-occipital lobe, both larger than seen on recent MR. Atrophy with small vessel chronic ischemic changes of deep cerebral white matter. Old high LEFT parietal and RIGHT thalamic infarcts. Stable arachnoid cyst RIGHT middle cranial fossa. No intracranial hemorrhage. Findings called to Dr. Waldron Labs on 09/20/2021 at 1838 hours. Electronically Signed   By: Lavonia Dana M.D.   On: 09/20/2021 18:39   CT ANGIO NECK W OR WO CONTRAST  Result Date: 09/16/2021 CLINICAL DATA:  Follow-up examination for stroke. EXAM: CT  ANGIOGRAPHY HEAD AND NECK TECHNIQUE: Multidetector CT imaging of the head and neck was performed using the standard protocol during bolus administration of intravenous contrast. Multiplanar CT image reconstructions and MIPs were obtained to evaluate the vascular anatomy. Carotid stenosis measurements (when applicable) are obtained utilizing NASCET criteria, using the distal internal carotid diameter as the denominator. RADIATION DOSE REDUCTION: This exam was performed according to the departmental dose-optimization program which includes automated exposure control, adjustment of the mA and/or kV according to patient size and/or use of iterative reconstruction technique. CONTRAST:  53mL OMNIPAQUE IOHEXOL 350 MG/ML SOLN COMPARISON:  MRI from earlier the same day as well is multiple previous CTAs. FINDINGS: CT HEAD FINDINGS Brain: Scattered small volume acute to subacute ischemic infarcts better evaluated on prior MRI performed immediately prior on the same day. No intracranial hemorrhage. Chronic left posterior MCA distribution infarct. Underlying severe chronic microvascular ischemic disease. Right middle cranial fossa arachnoid cyst. No other mass lesion or mass effect. Stable ventricular size without hydrocephalus. No extra-axial collection. Vascular: No hyperdense vessel. Skull: Soft tissue protuberance at the scalp vertex, indeterminate, but similar to previous (series 5, image 71). Calvarium intact. Sinuses: Left sphenoid sinusitis. Mild mucosal thickening within the ethmoidal air cells and maxillary sinuses. Mastoid air cells are clear. Orbits: Right gaze noted. Review of the MIP images confirms the above findings CTA NECK FINDINGS Aortic arch: Visualized aortic arch normal in  caliber with normal branch pattern. Moderate atheromatous change about the arch and origin of the great vessels without significant stenosis. Appearance is stable. Right carotid system: Right common and internal carotid arteries are  tortuous. Eccentric calcified plaque at the origin of the cervical right ICA with less than 50% stenosis by NASCET criteria, stable. No dissection or other acute finding. Left carotid system: Left common and internal carotid arteries are tortuous. Vascular stent spans the left carotid bifurcation. Stable stent patency. No dissection or other acute finding. Vertebral arteries: Both vertebral arteries arise from the subclavian arteries. No progressive proximal subclavian artery stenosis. Vertebral arteries remain patent without stenosis or dissection. Skeleton: No discrete or worrisome osseous lesions. Other neck: No other acute soft tissue abnormality within the neck. Upper chest: Unremarkable. Review of the MIP images confirms the above findings CTA HEAD FINDINGS Anterior circulation: Atheromatous change within the carotid siphons without significant or progressive stenosis. A1 segments and anterior communicating artery complex remain patent without abnormality. Atheromatous change throughout the ACAs without high-grade stenosis, stable. No new or progressive M1 stenosis. Severe atheromatous disease involving the bilateral MCA branches without proximal occlusion, relatively stable from prior. Posterior circulation: Atheromatous irregularity throughout the V4 segments without high-grade stenosis. Both PICA remain patent. Irregularity throughout the basilar without progressive stenosis. Superior cerebellar arteries remain patent at their origins. Both PCAs primarily supplied via the basilar. Advanced atheromatous disease involving the PCAs with associated severe bilateral P2 stenoses, stable. PCAs remain patent to their distal aspects. Venous sinuses: Patent allowing for timing the contrast bolus. Anatomic variants: None significant.  No aneurysm. Review of the MIP images confirms the above findings IMPRESSION: 1. Stable CTA of the head and neck, with no evidence for large vessel occlusion or other emergent finding.  2. Advanced intracranial atherosclerotic disease, most pronounced at the bilateral into and P2 segments. 3. Stable patent left carotid stent. Stable less than 50% stenosis at the origin of the cervical right ICA. Electronically Signed   By: Jeannine Boga M.D.   On: 09/16/2021 22:36   MR ANGIO HEAD WO CONTRAST  Result Date: 09/20/2021 CLINICAL DATA:  Stroke, follow-up EXAM: MRI HEAD WITHOUT CONTRAST MRA HEAD WITHOUT CONTRAST TECHNIQUE: Multiplanar, multi-echo pulse sequences of the brain and surrounding structures were acquired without intravenous contrast. Angiographic images of the Circle of Willis were acquired using MRA technique without intravenous contrast. COMPARISON:  09/16/2021 MRI head, correlation is also made with CT head 09/20/2021 and CTA 09/16/2021 FINDINGS: Exam somewhat limited by motion and early termination at patient request. MRI HEAD FINDINGS Brain: New large confluent areas of restricted diffusion with ADC correlates in the right sylvian fissure (series 4, image 27), posterior greater than anterior right temporal lobe (series 4, image 28), right parietal lobe (series 4, image 39), right occipital lobe (series 4, image 22), left occipital lobe (series 4, image 17), and posterior left hippocampus and medial temporal lobe (series 4, image 23). Additional more isolated areas of restricted diffusion in the left splenium of the corpus callosum (series 4, image 29), which was seen on the prior exam, and lateral left temporal lobe (series 4, image 18), which was not. These areas are associated with increased T2 signal and gyral swelling. Some of these areas previously demonstrated restricted diffusion on 09/16/2021, but not to this extent. No acute hemorrhage, mass, mass effect, or midline shift. Unchanged size and configuration of the ventricles. Advanced cerebral atrophy for age. Confluent T2 hyperintense signal in the periventricular white matter and pons, likely the sequela of  severe  chronic small vessel ischemic disease. Chronic hemosiderin deposition at the left anterior corpus callosum is again noted. Right middle cranial fossa arachnoid cyst. Vascular: Please see MRA findings below. Skull and upper cervical spine: Normal marrow signal. Sinuses/Orbits: Air-fluid level in the left sphenoid sinus, similar to the prior exam. Otherwise negative. Other: The mastoids are well aerated. MRA HEAD FINDINGS Anterior circulation: Both internal carotid arteries are patent to the termini, without stenosis or other abnormality. A1 segments patent. Normal anterior communicating artery. Anterior cerebral arteries are patent to their distal aspects. No M1 stenosis or occlusion. Normal MCA bifurcations. Focal narrowing in bilateral M2 branches, unchanged from the prior exam. Distal MCA branches otherwise perfused and symmetric. Posterior circulation: Vertebral arteries patent to the vertebrobasilar junction without stenosis. Basilar patent to its distal aspect. Superior cerebellar arteries patent bilaterally. Normal P1 segments. Redemonstrated severe stenosis in the bilateral P2 segments, which is near occlusive bilaterally. Anatomic variants: None significant IMPRESSION: 1. Exam is somewhat limited by motion and early termination. 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 Given multiple vascular territories, an embolic etiology is suspected. 2. Redemonstrated bilateral M2 and P2 severe stenosis. No new occlusion or significant stenosis. These results will be called to the ordering clinician or representative by the Radiologist Assistant, and communication documented in the PACS or [employer]. Electronically Signed   By: Merilyn Baba M.D.   On: 09/20/2021 21:47   MR BRAIN WO CONTRAST  Result Date: 09/20/2021 CLINICAL DATA:  Stroke, follow-up EXAM: MRI HEAD WITHOUT CONTRAST MRA HEAD WITHOUT  CONTRAST TECHNIQUE: Multiplanar, multi-echo pulse sequences of the brain and surrounding structures were acquired without intravenous contrast. Angiographic images of the Circle of Willis were acquired using MRA technique without intravenous contrast. COMPARISON:  09/16/2021 MRI head, correlation is also made with CT head 09/20/2021 and CTA 09/16/2021 FINDINGS: Exam somewhat limited by motion and early termination at patient request. MRI HEAD FINDINGS Brain: New large confluent areas of restricted diffusion with ADC correlates in the right sylvian fissure (series 4, image 27), posterior greater than anterior right temporal lobe (series 4, image 28), right parietal lobe (series 4, image 39), right occipital lobe (series 4, image 22), left occipital lobe (series 4, image 17), and posterior left hippocampus and medial temporal lobe (series 4, image 23). Additional more isolated areas of restricted diffusion in the left splenium of the corpus callosum (series 4, image 29), which was seen on the prior exam, and lateral left temporal lobe (series 4, image 18), which was not. These areas are associated with increased T2 signal and gyral swelling. Some of these areas previously demonstrated restricted diffusion on 09/16/2021, but not to this extent. No acute hemorrhage, mass, mass effect, or midline shift. Unchanged size and configuration of the ventricles. Advanced cerebral atrophy for age. Confluent T2 hyperintense signal in the periventricular white matter and pons, likely the sequela of severe chronic small vessel ischemic disease. Chronic hemosiderin deposition at the left anterior corpus callosum is again noted. Right middle cranial fossa arachnoid cyst. Vascular: Please see MRA findings below. Skull and upper cervical spine: Normal marrow signal. Sinuses/Orbits: Air-fluid level in the left sphenoid sinus, similar to the prior exam. Otherwise negative. Other: The mastoids are well aerated. MRA HEAD FINDINGS Anterior  circulation: Both internal carotid arteries are patent to the termini, without stenosis or other abnormality. A1 segments patent. Normal anterior communicating artery. Anterior cerebral arteries are patent to  their distal aspects. No M1 stenosis or occlusion. Normal MCA bifurcations. Focal narrowing in bilateral M2 branches, unchanged from the prior exam. Distal MCA branches otherwise perfused and symmetric. Posterior circulation: Vertebral arteries patent to the vertebrobasilar junction without stenosis. Basilar patent to its distal aspect. Superior cerebellar arteries patent bilaterally. Normal P1 segments. Redemonstrated severe stenosis in the bilateral P2 segments, which is near occlusive bilaterally. Anatomic variants: None significant IMPRESSION: 1. Exam is somewhat limited by motion and early termination. 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 Given multiple vascular territories, an embolic etiology is suspected. 2. Redemonstrated bilateral M2 and P2 severe stenosis. No new occlusion or significant stenosis. These results will be called to the ordering clinician or representative by the Radiologist Assistant, and communication documented in the PACS or [employer]. Electronically Signed   By: Merilyn Baba M.D.   On: 09/20/2021 21:47   MR BRAIN WO CONTRAST  Result Date: 09/16/2021 CLINICAL DATA:  Initial evaluation for neuro deficit, stroke suspected. EXAM: MRI HEAD WITHOUT CONTRAST TECHNIQUE: Multiplanar, multiecho pulse sequences of the brain and surrounding structures were obtained without intravenous contrast. COMPARISON:  Prior CT and MRI from 06/21/2021. FINDINGS: Brain: Examination mildly degraded by motion artifact. Age-related cerebral atrophy with advanced chronic microvascular ischemic disease. Remote posterior left MCA distribution infarct involving the left frontoparietal  region. Scattered remote lacunar infarcts present about the hemispheric cerebral white matter and deep gray nuclei. Patchy areas of restricted diffusion involving the cortical and subcortical aspect of the right parieto-occipital region, consistent with acute ischemic infarcts, posterior right MCA and/or MCA/PCA watershed distribution (series 5, image 83). Largest area of infarction measures 1.8 cm at the periatrial white matter. No associated hemorrhage or mass effect. Additional mild patchy diffusion abnormality seen at the posterior left splenium and adjacent subcortical left occipital lobe (series 5, images 79, 72), consistent with a few additional small acute to early subacute ischemic infarcts. No associated hemorrhage or mass effect at this location. Otherwise, no other evidence for new or interval infarction. No acute intracranial hemorrhage. Chronic blood products at the left anterior corpus callosum noted. Benign arachnoid cyst noted at the right middle cranial fossa. No other mass lesion, mass effect, or midline shift. No hydrocephalus or extra-axial fluid collection. Pituitary gland suprasellar region normal. Midline structures intact. Vascular: Major intracranial vascular flow voids are maintained. Skull and upper cervical spine: Craniocervical junction normal. Bone marrow signal intensity normal. A scalp soft tissue abnormality. Sinuses/Orbits: Right gaze noted. Moderate secretions noted within the left sphenoid sinus. Scattered mucosal thickening noted within the ethmoidal air cells. Mastoid air cells are clear. Other: None. IMPRESSION: 1. Patchy acute ischemic infarcts involving the right parieto-occipital region as above. No associated hemorrhage or mass effect. 2. Additional small volume acute to early subacute ischemic infarcts involving the posterior left splenium and adjacent subcortical left occipital lobe. 3. Underlying age-related cerebral atrophy with advanced chronic microvascular ischemic  disease, with multiple additional remote infarcts as above. Electronically Signed   By: Jeannine Boga M.D.   On: 09/16/2021 19:03   DG Chest Portable 1 View  Result Date: 08/24/2021 CLINICAL DATA:  Diffuse chest wall pain. EXAM: PORTABLE CHEST 1 VIEW COMPARISON:  Chest x-ray dated April 02, 2021. FINDINGS: The heart size and mediastinal contours are within normal limits. Both lungs are clear. The visualized skeletal structures are unremarkable. IMPRESSION: No active disease. Electronically Signed   By: Orville Govern.D.  On: 08/24/2021 12:15   EEG adult  Result Date: 09/21/2021 Lora Havens, MD     09/21/2021  9:48 AM Patient Name: Jade [name] MRN: [id number] Epilepsy Attending: Lora Havens Referring Physician/Provider: Albertine Patricia, MD Date: 09/21/2021 Duration: 22.28 mins Patient history: 66 year old female with vision changes and confusion.  EEG to evaluate for seizures. Level of alertness: Awake, asleep AEDs during EEG study: None Technical aspects: This EEG study was done with scalp electrodes positioned according to the 10-20 International system of electrode placement. Electrical activity was acquired at a sampling rate of 500Hz  and reviewed with a high frequency filter of 70Hz  and a low frequency filter of 1Hz . EEG data were recorded continuously and digitally stored. Description: The posterior dominant rhythm consists of 8 Hz activity of moderate voltage (25-35 uV) seen predominantly in posterior head regions, asymmetric ( right<left) and reactive to eye opening and eye closing. Sleep was characterized by vertex waves, sleep spindles (12 to 14 Hz), maximal frontocentral region.  EEG showed continuous 3 to 5 Hz theta-delta slowing in right hemisphere as well as intermittent 3 to 5 Hz theta-delta slowing left hemisphere.  Hyperventilation and photic stimulation were not performed.   ABNORMALITY - Continuous slow, right hemisphere - Intermittent slow, left hemisphere  IMPRESSION: This study is suggestive of cortical dysfunction in right hemisphere likely secondary to underlying structural abnormality/stroke.  Additionally there is evidence of nonspecific cortical dysfunction in left hemisphere which could be secondary to underlying strokes as well.  No seizures or epileptiform discharges were seen throughout the recording. Lora Havens   ECHOCARDIOGRAM COMPLETE  Result Date: 09/17/2021    ECHOCARDIOGRAM REPORT   Patient Name:   Jade [name] Date of Exam: 09/17/2021 Medical Rec #:  [id number]         Height:       66.0 in Accession #:    [id number]        Weight:       160.1 lb Date of Birth:  November 13, 1955         BSA:          1.819 m Patient Age:    8 years          BP:           149/79 mmHg Patient Gender: F                 HR:           93 bpm. Exam Location:  Inpatient Procedure: 2D Echo, Cardiac Doppler and Color Doppler Indications:    Stroke I63.9  History:        Patient has prior history of Echocardiogram examinations, most                 recent 06/19/2021. Stroke; Risk Factors:Hypertension and                 Dyslipidemia.  Sonographer:    Merrie Roof RDCS Referring Phys: [place]  1. Left ventricular ejection fraction, by estimation, is 60 to 65%. The left ventricle has normal function. The left ventricle has no regional wall motion abnormalities. There is mild left ventricular hypertrophy. Left ventricular diastolic parameters were normal.  2. Right ventricular systolic function is normal. The right ventricular size is normal. There is normal pulmonary artery systolic pressure. The estimated right ventricular systolic pressure is 123456 mmHg.  3. The mitral valve is normal in structure. No evidence of mitral valve regurgitation.  4. The aortic valve was not well visualized. Aortic valve regurgitation is not visualized. No aortic stenosis is present. FINDINGS  Left Ventricle: Left ventricular ejection fraction, by estimation, is 60 to  65%. The left ventricle has normal function. The left ventricle has no regional wall motion abnormalities. The left ventricular internal cavity size was normal in size. There is  mild left ventricular hypertrophy. Left ventricular diastolic parameters were normal. Right Ventricle: The right ventricular size is normal. No increase in right ventricular wall thickness. Right ventricular systolic function is normal. There is normal pulmonary artery systolic pressure. The tricuspid regurgitant velocity is 2.45 m/s, and  with an assumed right atrial pressure of 3 mmHg, the estimated right ventricular systolic pressure is 123456 mmHg. Left Atrium: Left atrial size was normal in size. Right Atrium: Right atrial size was normal in size. Pericardium: There is no evidence of pericardial effusion. Mitral Valve: The mitral valve is normal in structure. Mild to moderate mitral annular calcification. No evidence of mitral valve regurgitation. Tricuspid Valve: The tricuspid valve is normal in structure. Tricuspid valve regurgitation is trivial. Aortic Valve: The aortic valve was not well visualized. Aortic valve regurgitation is not visualized. No aortic stenosis is present. Aortic valve mean gradient measures 7.0 mmHg. Aortic valve peak gradient measures 13.0 mmHg. Aortic valve area, by VTI measures 1.88 cm. Pulmonic Valve: The pulmonic valve was not well visualized. Pulmonic valve regurgitation is not visualized. Aorta: The aortic root and ascending aorta are structurally normal, with no evidence of dilitation. IAS/Shunts: The interatrial septum was not well visualized.  LEFT VENTRICLE PLAX 2D LVIDd:         4.10 cm   Diastology LVIDs:         2.70 cm   LV e' medial:    9.79 cm/s LV PW:         1.00 cm   LV E/e' medial:  11.4 LV IVS:        0.90 cm   LV e' lateral:   8.59 cm/s LVOT diam:     1.90 cm   LV E/e' lateral: 13.0 LV SV:         59 LV SV Index:   32 LVOT Area:     2.84 cm  RIGHT VENTRICLE RV Basal diam:  3.40 cm LEFT  ATRIUM             Index        RIGHT ATRIUM           Index LA diam:        4.00 cm 2.20 cm/m   RA Area:     14.80 cm LA Vol (A2C):   63.1 ml 34.69 ml/m  RA Volume:   33.10 ml  18.20 ml/m LA Vol (A4C):   38.8 ml 21.33 ml/m LA Biplane Vol: 50.5 ml 27.76 ml/m  AORTIC VALVE AV Area (Vmax):    1.91 cm AV Area (Vmean):   1.93 cm AV Area (VTI):     1.88 cm AV Vmax:           180.00 cm/s AV Vmean:          123.000 cm/s AV VTI:            0.314 m AV Peak Grad:      13.0 mmHg AV Mean Grad:      7.0 mmHg LVOT Vmax:         121.00 cm/s LVOT Vmean:        83.800  cm/s LVOT VTI:          0.208 m LVOT/AV VTI ratio: 0.66  AORTA Ao Root diam: 3.10 cm Ao Asc diam:  3.50 cm MITRAL VALVE                TRICUSPID VALVE MV Area (PHT): 4.60 cm     TR Peak grad:   24.0 mmHg MV Decel Time: 165 msec     TR Vmax:        245.00 cm/s MV E velocity: 112.00 cm/s MV A velocity: 140.00 cm/s  SHUNTS MV E/A ratio:  0.80         Systemic VTI:  0.21 m                             Systemic Diam: 1.90 cm Epifanio Lesches MD Electronically signed by Epifanio Lesches MD Signature Date/Time: 09/17/2021/3:25:22 PM    Final      Subjective:  No significant events overnight as discussed with staff, patient herself denies any complaints today. Discharge Exam: Vitals:   09/22/21 0823 09/22/21 1149  BP: (!) 175/87 (!) 150/76  Pulse: 89 91  Resp: 20 (!) 22  Temp: 99.9 F (37.7 C) 99.6 F (37.6 C)  SpO2: 100% 98%   Vitals:   09/21/21 2334 09/22/21 0342 09/22/21 0823 09/22/21 1149  BP: (!) 157/74 (!) 168/79 (!) 175/87 (!) 150/76  Pulse: 100 85 89 91  Resp: 20 17 20  (!) 22  Temp: 99.6 F (37.6 C) 98.4 F (36.9 C) 99.9 F (37.7 C) 99.6 F (37.6 C)  TempSrc: Axillary Axillary Oral Oral  SpO2: 96% 100% 100% 98%  Weight:      Height:        General: Pt is alert, awake, impaired cognition and insight, no aphasia, follows simple commands, not tracking bilaterally, bilateral vision loss, she has right-sided weakness.  Not in  acute distress Cardiovascular: RRR, S1/S2 +, no rubs, no gallops Respiratory: CTA bilaterally, no wheezing, no rhonchi Abdominal: Soft, NT, ND, bowel sounds + Extremities: no edema, no cyanosis    The results of significant diagnostics from this hospitalization (including imaging, microbiology, ancillary and laboratory) are listed below for reference.     Microbiology: Recent Results (from the past 240 hour(s))  Resp Panel by RT-PCR (Flu A&B, Covid) Nasopharyngeal Swab     Status: None   Collection Time: 09/16/21  4:04 PM   Specimen: Nasopharyngeal Swab; Nasopharyngeal(NP) swabs in vial transport medium  Result Value Ref Range Status   SARS Coronavirus 2 by RT PCR NEGATIVE NEGATIVE Final    Comment: (NOTE) SARS-CoV-2 target nucleic acids are NOT DETECTED.  The SARS-CoV-2 RNA is generally detectable in upper respiratory specimens during the acute phase of infection. The lowest concentration of SARS-CoV-2 viral copies this assay can detect is 138 copies/mL. A negative result does not preclude SARS-Cov-2 infection and should not be used as the sole basis for treatment or other patient management decisions. A negative result may occur with  improper specimen collection/handling, submission of specimen other than nasopharyngeal swab, presence of viral mutation(s) within the areas targeted by this assay, and inadequate number of viral copies(<138 copies/mL). A negative result must be combined with clinical observations, patient history, and epidemiological information. The expected result is Negative.  Fact Sheet for Patients:  BloggerCourse.com  Fact Sheet for Healthcare Providers:  SeriousBroker.it  This test is no t yet approved or cleared by the [place] and  has been authorized for detection and/or diagnosis of SARS-CoV-2 by FDA under an Emergency Use Authorization (EUA). This EUA will remain  in effect (meaning this  test can be used) for the duration of the COVID-19 declaration under Section 564(b)(1) of the Act, 21 U.S.C.section 360bbb-3(b)(1), unless the authorization is terminated  or revoked sooner.       Influenza A by PCR NEGATIVE NEGATIVE Final   Influenza B by PCR NEGATIVE NEGATIVE Final    Comment: (NOTE) The Xpert Xpress SARS-CoV-2/FLU/RSV plus assay is intended as an aid in the diagnosis of influenza from Nasopharyngeal swab specimens and should not be used as a sole basis for treatment. Nasal washings and aspirates are unacceptable for Xpert Xpress SARS-CoV-2/FLU/RSV testing.  Fact Sheet for Patients: EntrepreneurPulse.com.au  Fact Sheet for Healthcare Providers: IncredibleEmployment.be  This test is not yet approved or cleared by the [place] FDA and has been authorized for detection and/or diagnosis of SARS-CoV-2 by FDA under an Emergency Use Authorization (EUA). This EUA will remain in effect (meaning this test can be used) for the duration of the COVID-19 declaration under Section 564(b)(1) of the Act, 21 U.S.C. section 360bbb-3(b)(1), unless the authorization is terminated or revoked.  Performed at [facility], [place] [address] [place], [place] [address]   Resp Panel by RT-PCR (Flu A&B, Covid) Nasopharyngeal Swab     Status: None   Collection Time: 09/20/21  2:28 PM   Specimen: Nasopharyngeal Swab; Nasopharyngeal(NP) swabs in vial transport medium  Result Value Ref Range Status   SARS Coronavirus 2 by RT PCR NEGATIVE NEGATIVE Final    Comment: (NOTE) SARS-CoV-2 target nucleic acids are NOT DETECTED.  The SARS-CoV-2 RNA is generally detectable in upper respiratory specimens during the acute phase of infection. The lowest concentration of SARS-CoV-2 viral copies this assay can detect is 138 copies/mL. A negative result does not preclude SARS-Cov-2 infection and should not be used as the sole basis for treatment or other  patient management decisions. A negative result may occur with  improper specimen collection/handling, submission of specimen other than nasopharyngeal swab, presence of viral mutation(s) within the areas targeted by this assay, and inadequate number of viral copies(<138 copies/mL). A negative result must be combined with clinical observations, patient history, and epidemiological information. The expected result is Negative.  Fact Sheet for Patients:  EntrepreneurPulse.com.au  Fact Sheet for Healthcare Providers:  IncredibleEmployment.be  This test is no t yet approved or cleared by the [place] FDA and  has been authorized for detection and/or diagnosis of SARS-CoV-2 by FDA under an Emergency Use Authorization (EUA). This EUA will remain  in effect (meaning this test can be used) for the duration of the COVID-19 declaration under Section 564(b)(1) of the Act, 21 U.S.C.section 360bbb-3(b)(1), unless the authorization is terminated  or revoked sooner.       Influenza A by PCR NEGATIVE NEGATIVE Final   Influenza B by PCR NEGATIVE NEGATIVE Final    Comment: (NOTE) The Xpert Xpress SARS-CoV-2/FLU/RSV plus assay is intended as an aid in the diagnosis of influenza from Nasopharyngeal swab specimens and should not be used as a sole basis for treatment. Nasal washings and aspirates are unacceptable for Xpert Xpress SARS-CoV-2/FLU/RSV testing.  Fact Sheet for Patients: EntrepreneurPulse.com.au  Fact Sheet for Healthcare Providers: IncredibleEmployment.be  This test is not yet approved or cleared by the [place] FDA and has been authorized for detection and/or diagnosis of SARS-CoV-2 by FDA under an Emergency Use Authorization (EUA). This EUA will remain  in effect (meaning this test can be used) for the duration of the COVID-19 declaration under Section 564(b)(1) of the Act, 21 U.S.C. section  360bbb-3(b)(1), unless the authorization is terminated or revoked.  Performed at [facility], [place] [address], [place], [place] [address]      Labs: BNP (last 3 results) No results for input(s): BNP in the last 8760 hours. Basic Metabolic Panel: Recent Labs  Lab 09/16/21 1423 09/18/21 0731 09/19/21 0400 09/22/21 0231  NA 138 141 137 136  K 3.5 3.1* 3.4* 4.0  CL 107 110 108 105  CO2 23 23 21* 20*  GLUCOSE 155* 152* 114* 129*  BUN 21 10 10 15   CREATININE 0.80 0.76 0.84 0.96  CALCIUM 8.7* 9.1 8.7* 8.9   Liver Function Tests: Recent Labs  Lab 09/16/21 1423  AST 17  ALT 13  ALKPHOS 57  BILITOT 0.3  PROT 5.5*  ALBUMIN 3.2*   No results for input(s): LIPASE, AMYLASE in the last 168 hours. No results for input(s): AMMONIA in the last 168 hours. CBC: Recent Labs  Lab 09/16/21 1423 09/16/21 2211 09/18/21 1615 09/19/21 0400 09/19/21 1537 09/20/21 0334 09/22/21 0231  WBC 6.6   < > 11.9* 10.4 10.2 12.3* 10.1  NEUTROABS 4.9  --   --   --   --   --   --   HGB 7.7*   < > 9.1* 8.6* 9.4* 9.0* 9.4*  HCT 24.6*   < > 26.6* 25.6* 28.5* 27.9* 28.6*  MCV 98.0   < > 90.5 91.1 93.1 93.0 92.3  PLT 372   < > 467* 437* 456* 464* 415*   < > = values in this interval not displayed.   Cardiac Enzymes: No results for input(s): CKTOTAL, CKMB, CKMBINDEX, TROPONINI in the last 168 hours. BNP: Invalid input(s): POCBNP CBG: Recent Labs  Lab 09/20/21 1038  GLUCAP 163*   D-Dimer No results for input(s): DDIMER in the last 72 hours. Hgb A1c No results for input(s): HGBA1C in the last 72 hours. Lipid Profile No results for input(s): CHOL, HDL, LDLCALC, TRIG, CHOLHDL, LDLDIRECT in the last 72 hours. Thyroid function studies No results for input(s): TSH, T4TOTAL, T3FREE, THYROIDAB in the last 72 hours.  Invalid input(s): FREET3 Anemia work up No results for input(s): VITAMINB12, FOLATE, FERRITIN, TIBC, IRON, RETICCTPCT in the last 72 hours. Urinalysis    Component Value  Date/Time   COLORURINE YELLOW 09/16/2021 1423   APPEARANCEUR HAZY (A) 09/16/2021 1423   LABSPEC >1.046 (H) 09/16/2021 1423   PHURINE 5.0 09/16/2021 1423   GLUCOSEU NEGATIVE 09/16/2021 1423   GLUCOSEU NEGATIVE 03/08/2015 0855   HGBUR NEGATIVE 09/16/2021 1423   BILIRUBINUR NEGATIVE 09/16/2021 1423   KETONESUR NEGATIVE 09/16/2021 1423   PROTEINUR NEGATIVE 09/16/2021 1423   UROBILINOGEN 0.2 03/08/2015 0855   NITRITE NEGATIVE 09/16/2021 1423   LEUKOCYTESUR MODERATE (A) 09/16/2021 1423   Sepsis Labs Invalid input(s): PROCALCITONIN,  WBC,  LACTICIDVEN Microbiology Recent Results (from the past 240 hour(s))  Resp Panel by RT-PCR (Flu A&B, Covid) Nasopharyngeal Swab     Status: None   Collection Time: 09/16/21  4:04 PM   Specimen: Nasopharyngeal Swab; Nasopharyngeal(NP) swabs in vial transport medium  Result Value Ref Range Status   SARS Coronavirus 2 by RT PCR NEGATIVE NEGATIVE Final    Comment: (NOTE) SARS-CoV-2 target nucleic acids are NOT DETECTED.  The SARS-CoV-2 RNA is generally detectable in upper respiratory specimens during the acute phase of infection. The lowest concentration of SARS-CoV-2 viral copies  this assay can detect is 138 copies/mL. A negative result does not preclude SARS-Cov-2 infection and should not be used as the sole basis for treatment or other patient management decisions. A negative result may occur with  improper specimen collection/handling, submission of specimen other than nasopharyngeal swab, presence of viral mutation(s) within the areas targeted by this assay, and inadequate number of viral copies(<138 copies/mL). A negative result must be combined with clinical observations, patient history, and epidemiological information. The expected result is Negative.  Fact Sheet for Patients:  EntrepreneurPulse.com.au  Fact Sheet for Healthcare Providers:  IncredibleEmployment.be  This test is no t yet approved or  cleared by the [place] FDA and  has been authorized for detection and/or diagnosis of SARS-CoV-2 by FDA under an Emergency Use Authorization (EUA). This EUA will remain  in effect (meaning this test can be used) for the duration of the COVID-19 declaration under Section 564(b)(1) of the Act, 21 U.S.C.section 360bbb-3(b)(1), unless the authorization is terminated  or revoked sooner.       Influenza A by PCR NEGATIVE NEGATIVE Final   Influenza B by PCR NEGATIVE NEGATIVE Final    Comment: (NOTE) The Xpert Xpress SARS-CoV-2/FLU/RSV plus assay is intended as an aid in the diagnosis of influenza from Nasopharyngeal swab specimens and should not be used as a sole basis for treatment. Nasal washings and aspirates are unacceptable for Xpert Xpress SARS-CoV-2/FLU/RSV testing.  Fact Sheet for Patients: EntrepreneurPulse.com.au  Fact Sheet for Healthcare Providers: IncredibleEmployment.be  This test is not yet approved or cleared by the [place] FDA and has been authorized for detection and/or diagnosis of SARS-CoV-2 by FDA under an Emergency Use Authorization (EUA). This EUA will remain in effect (meaning this test can be used) for the duration of the COVID-19 declaration under Section 564(b)(1) of the Act, 21 U.S.C. section 360bbb-3(b)(1), unless the authorization is terminated or revoked.  Performed at [facility], [place] [address], [place], [place] [address]   Resp Panel by RT-PCR (Flu A&B, Covid) Nasopharyngeal Swab     Status: None   Collection Time: 09/20/21  2:28 PM   Specimen: Nasopharyngeal Swab; Nasopharyngeal(NP) swabs in vial transport medium  Result Value Ref Range Status   SARS Coronavirus 2 by RT PCR NEGATIVE NEGATIVE Final    Comment: (NOTE) SARS-CoV-2 target nucleic acids are NOT DETECTED.  The SARS-CoV-2 RNA is generally detectable in upper respiratory specimens during the acute phase of infection. The  lowest concentration of SARS-CoV-2 viral copies this assay can detect is 138 copies/mL. A negative result does not preclude SARS-Cov-2 infection and should not be used as the sole basis for treatment or other patient management decisions. A negative result may occur with  improper specimen collection/handling, submission of specimen other than nasopharyngeal swab, presence of viral mutation(s) within the areas targeted by this assay, and inadequate number of viral copies(<138 copies/mL). A negative result must be combined with clinical observations, patient history, and epidemiological information. The expected result is Negative.  Fact Sheet for Patients:  EntrepreneurPulse.com.au  Fact Sheet for Healthcare Providers:  IncredibleEmployment.be  This test is no t yet approved or cleared by the [place] FDA and  has been authorized for detection and/or diagnosis of SARS-CoV-2 by FDA under an Emergency Use Authorization (EUA). This EUA will remain  in effect (meaning this test can be used) for the duration of the COVID-19 declaration under Section 564(b)(1) of the Act, 21 U.S.C.section 360bbb-3(b)(1), unless the authorization is terminated  or revoked sooner.  Influenza A by PCR NEGATIVE NEGATIVE Final   Influenza B by PCR NEGATIVE NEGATIVE Final    Comment: (NOTE) The Xpert Xpress SARS-CoV-2/FLU/RSV plus assay is intended as an aid in the diagnosis of influenza from Nasopharyngeal swab specimens and should not be used as a sole basis for treatment. Nasal washings and aspirates are unacceptable for Xpert Xpress SARS-CoV-2/FLU/RSV testing.  Fact Sheet for Patients: EntrepreneurPulse.com.au  Fact Sheet for Healthcare Providers: IncredibleEmployment.be  This test is not yet approved or cleared by the [place] FDA and has been authorized for detection and/or diagnosis of SARS-CoV-2 by FDA under  an Emergency Use Authorization (EUA). This EUA will remain in effect (meaning this test can be used) for the duration of the COVID-19 declaration under Section 564(b)(1) of the Act, 21 U.S.C. section 360bbb-3(b)(1), unless the authorization is terminated or revoked.  Performed at [facility], [place] [address] [place], [place] [address]      Time coordinating discharge: Over 30 minutes  SIGNED:   Phillips Climes, MD  [facility] 09/22/2021, 12:29 PM Pager   If 7PM-7AM, please contact night-coverage [email or web address] Password [email or web address]

## 2021-09-22 NOTE — Progress Notes (Addendum)
STROKE TEAM PROGRESS NOTE   INTERVAL HISTORY No family at bedside. Patient easily arousable but unable to see out of both eyes but able to turn towards sound. Patient oriented to self but not location, time, or context.  No acute neurological deficits reported. Signing off.   Vitals:   09/21/21 2014 09/21/21 2334 09/22/21 0342 09/22/21 0823  BP: (!) 169/78 (!) 157/74 (!) 168/79 (!) 175/87  Pulse: (!) 109 100 85 89  Resp: 20 20 17 20   Temp: 99.1 F (37.3 C) 99.6 F (37.6 C) 98.4 F (36.9 C) 99.9 F (37.7 C)  TempSrc: Axillary Axillary Axillary Oral  SpO2: 100% 96% 100% 100%  Weight:      Height:       CBC:  Recent Labs  Lab 09/16/21 1423 09/16/21 2211 09/20/21 0334 09/22/21 0231  WBC 6.6   < > 12.3* 10.1  NEUTROABS 4.9  --   --   --   HGB 7.7*   < > 9.0* 9.4*  HCT 24.6*   < > 27.9* 28.6*  MCV 98.0   < > 93.0 92.3  PLT 372   < > 464* 415*   < > = values in this interval not displayed.    Basic Metabolic Panel:  Recent Labs  Lab 09/19/21 0400 09/22/21 0231  NA 137 136  K 3.4* 4.0  CL 108 105  CO2 21* 20*  GLUCOSE 114* 129*  BUN 10 15  CREATININE 0.84 0.96  CALCIUM 8.7* 8.9    Lipid Panel:  Recent Labs  Lab 09/17/21 0426  CHOL 126  TRIG 128  HDL 43  CHOLHDL 2.9  VLDL 26  LDLCALC 57    HgbA1c:  Recent Labs  Lab 09/17/21 0426  HGBA1C 5.7*    Urine Drug Screen:  Recent Labs  Lab 09/16/21 1423  LABOPIA NONE DETECTED  COCAINSCRNUR NONE DETECTED  LABBENZ NONE DETECTED  AMPHETMU NONE DETECTED  THCU NONE DETECTED  LABBARB NONE DETECTED     Alcohol Level  Recent Labs  Lab 09/16/21 1423  ETH <10     IMAGING past 24 hours No results found.  PHYSICAL EXAM  Temp:  [98.4 F (36.9 C)-99.9 F (37.7 C)] 99.9 F (37.7 C) (01/27 0823) Pulse Rate:  [85-109] 89 (01/27 0823) Resp:  [17-20] 20 (01/27 0823) BP: (152-175)/(72-87) 175/87 (01/27 0823) SpO2:  [96 %-100 %] 100 % (01/27 0823)  General - ill-appearing, fatigued caucasian female  but in no acute distress.  Cardiovascular - Regular rhythm and rate.  Neuro - awake, alert, eyes open, orientated to self only. No aphasia, paucity of speech, following most simple commands. Able to name "nose" "ear" and able to repeat. No gaze palsy, not tracking bilaterally, bilateral vision loss in the setting of previous left hemianopia. No facial droop. Tongue midline. LUE 5/5, RUE 3-/5 proximal and distal 0/5 which is chronic. LLE 3/5 proximal and 4/5 distal. RLE 3/5 proximal and 0/5 distally. Sensation symmetrical bilaterally, FTN intact on the left, gait not tested.    ASSESSMENT/PLAN Jade [name] is a 66 y.o. female with history of HTN, T2DM, CVD, multiple strokes since July 2022, HLD, L carotid stenosis s/p stent presenting with altered mental status and blurry vision.   Stroke - left PCA and right MCA large infarcts, likely due to b/l PCA and MCA stenosis in the setting of severe anemia and holding off antiplatelet  Progressive bilateral vision loss with disorientation  1/21 CTA head & neck Stable with no evidence of LVO  or emergent findings, advanced intracranial atherosclerotic disease, L stable patent carotid stent, R ICA <50% stenosis 1/21 MRI  Patchy acute ischemic infarcts involving the right parieto-occipital region, additional small volume acute to early subacute ischemic infarcts involving the posterior left splenium and adjacent subcortical left occipital lobe, underlying age-related cerebral atrophy with advanced chronic microvascular ischemic disease 1/25 CT - evolving subacute infarct in L occipital and R posterior parieto-occipital lobe 1/25 MRI - large bilateral new infarcts in temporal lobes and bilateral occipital lobes as well as right parietal lobes.  EEG no seizure 2D Echo EF 60-65%  LDL 57 HgbA1c 5.7 Now on ASA + Plavix after GI clearance VTE prophylaxis - SCD Therapy recommendations:  SNF Disposition:  pending  Hx of stroke 05/2021 admitted for  generalized weakness.  MRI showed subacute bilateral corpus callosum infarcts due to bilateral PCA known high-grade stenosis.  CTA head and neck showed severe stenosis bilateral P2 and bilateral MCA branch vessels.  Repeat MRI showed 3 total new punctate infarcts at the bilateral hippocampi and left parieto-occipital lobe, consistent with high-grade PCA stenosis in the setting of hypotension.  EF initial report 25 to 30%, however later corrected to 40 to 45%.  LDL 133, A1c 5.7.  Patient was put on aspirin and Brilinta for 30 days and then back to aspirin and Plavix.  Patient discharged to CIR. 03/2021 admitted for right-sided weakness.  CT no acute abnormality.  CTA head and neck showed 75% stenosis of proximal left ICA.  Bilateral PCA high-grade stenosis.  MRI showed small acute infarct at the left frontoparietal junction.  Additional small infarct adjacent to the frontal horn of the left lateral ventricle.  Status post left TCAR 04/06/2021.  Postprocedure increase right leg weakness.  CT head and neck unchanged except interval left ICA stent, patent with 45% stenosis.  MRI on 8/12 showed interval enlargement/expansion of the previous left frontoparietal infarct, likely due to periprocedural fluctuating BP.  LDL 78, A1c 6.5.  Discharged with aspirin 325 and Plavix 75 for 3 months and then Plavix alone. Admitted in 02/2021 for blurry vision and hypertension, MRI showed left thalamic small infarcts.  CTA head and neck showed left ICA 75% stenosis.  MRA head showed severe distal right M2, and right P2 stenosis.  A1c 6.4, LDL 184.  EF 55 to 60%, creatinine 1.10.  She was discharged with aspirin 81 Plavix 75 DAPT for 3 weeks and then Plavix alone, as well as Lipitor 80. Admitted 02/2018 with left thalamic/internal capsule infarct.  EF 60 to 65%, LDL 130, A1c 5.8.  CT head and neck at that time left ICA 80% stenosis.  Referred her to vascular surgery and was planning to do left CEA, however patient canceled the procedure  herself.  She then followed with her PCP and referred to see a neurologist Dr. Mikeal Hawthorne in [place], had carotid Doppler on 02/20/2021 which reported as unremarkable.   Acute severe anemia Hemoglobin baseline 12-14.  However, on presentation she was found hemoglobin 5.9->5.4.  Received PRBC transfusion.  Hemoglobin improved to 9-10. GI on board, had EGD today showed none bleeding duodenal ulcer.  Cleared by GI for aspirin Plavix CBC monitoring  Hypertension Home meds:  norvasc Stable Avoid low BP Long-term BP goal 130-150 given advanced intracranial athero  Hyperlipidemia Home meds:  lipitor 80, resumed in hospital LDL 57, goal < 70 On lipitor 80 mg  Continue statin at discharge  Diabetes type II Controlled Home meds: none HgbA1c 5.7, goal < 7.0 CBGs SSI  Other  Stroke Risk Factors Advanced Age >/= 78  Former Cigarette smoker  Other Active Problems Cognitive impairment - likely vascular dementia  Hospital day # [place] PGY1 Resident 09/22/2021 11:48 AM  ATTENDING NOTE: I reviewed above note and agree with the assessment and plan. Pt was seen and examined.   RN at bedside.  Patient sitting in bed, RN is feeding patient for lunch.  Patient still has bilateral vision loss, chronic left hemiparesis.  PT/OT recommend SNF.  Not on aspirin and Plavix, and hemoglobin stable.  Continue Lipitor 80.  Avoid low BP, BP goal 1 30-1 50 given severe intracranial stenosis.  We will follow-up in clinic.  For detailed assessment and plan, please refer to above as I have made changes wherever appropriate.   Neurology will sign off. Please call with questions. Pt will follow up with stroke clinic NP at [facility] in about 4 weeks. Thanks for the consult.   [name], MD PhD Stroke Neurology 09/22/2021 4:23 PM    To contact Stroke Continuity provider, please refer to [email or web address] After hours, contact General Neurology

## 2021-09-22 NOTE — Progress Notes (Signed)
° ° ° °  [facility] Progress Note   Pathology results from recent EGD are now available.   FINAL MICROSCOPIC DIAGNOSIS:   A. STOMACH, BIOPSY:  - Antral and oxyntic mucosa with hyperemia.  - No Helicobacter pylori identified.   Recommendations: - Continue pantoprazole 40 mg BID x 10 weeks, then reduce to once daily while using ASA and - Avoid all NSAIDs as you are able.  Please call the on-call gastroenterologist with any additional questions or concerns at this time.

## 2021-09-22 NOTE — Discharge Instructions (Signed)
Follow with Primary MD Nathaneil Canary, PA-C/ SNF physician  Get CBC, CMP,  checked  by Primary MD next visit.    Activity: As tolerated with Full fall precautions use walker/cane & assistance as needed   Disposition SNF   Diet: Heart Healthy , with feeding assistance and aspiration precautions.  For Heart failure patients - Check your Weight same time everyday, if you gain over 2 pounds, or you develop in leg swelling, experience more shortness of breath or chest pain, call your Primary MD immediately. Follow Cardiac Low Salt Diet and 1.5 lit/day fluid restriction.   On your next visit with your primary care physician please Get Medicines reviewed and adjusted.   Please request your Prim.MD to go over all Hospital Tests and Procedure/Radiological results at the follow up, please get all Hospital records sent to your Prim MD by signing hospital release before you go home.   If you experience worsening of your admission symptoms, develop shortness of breath, life threatening emergency, suicidal or homicidal thoughts you must seek medical attention immediately by calling 911 or calling your MD immediately  if symptoms less severe.  You Must read complete instructions/literature along with all the possible adverse reactions/side effects for all the Medicines you take and that have been prescribed to you. Take any new Medicines after you have completely understood and accpet all the possible adverse reactions/side effects.   Do not drive, operating heavy machinery, perform activities at heights, swimming or participation in water activities or provide baby sitting services if your were admitted for syncope or siezures until you have seen by Primary MD or a Neurologist and advised to do so again.  Do not drive when taking Pain medications.    Do not take more than prescribed Pain, Sleep and Anxiety Medications  Special Instructions: If you have smoked or chewed Tobacco  in the last 2 yrs  please stop smoking, stop any regular Alcohol  and or any Recreational drug use.  Wear Seat belts while driving.   Please note  You were cared for by a hospitalist during your hospital stay. If you have any questions about your discharge medications or the care you received while you were in the hospital after you are discharged, you can call the unit and asked to speak with the hospitalist on call if the hospitalist that took care of you is not available. Once you are discharged, your primary care physician will handle any further medical issues. Please note that NO REFILLS for any discharge medications will be authorized once you are discharged, as it is imperative that you return to your primary care physician (or establish a relationship with a primary care physician if you do not have one) for your aftercare needs so that they can reassess your need for medications and monitor your lab values.

## 2021-09-22 NOTE — Progress Notes (Signed)
Speech Language Pathology Treatment: Cognitive-Linquistic  Patient Details Name: Jade [name] MRN: [id number] DOB: 28-Jul-1956 Today's Date: 09/22/2021 Time: 9371-6967 SLP Time Calculation (min) (ACUTE ONLY): 16 min  Assessment / Plan / Recommendation Clinical Impression  Pt seen for treatment of cognitive functions with focus on complex problem solving for management of health conditions at home. When provided verbal problem solving scenarios related to this topic, pt provided effects of and solutions to problems independently in 1/5 trials. Moderate verbal/question cueing assisted pt in providing accurate responses in 2/5 trials. Son entered few minutes into session and reported that, prior to stroke, he was assisting with management of medications including ordering, organizing, and providing meds in a cup for pt to take in am/pm. Given pt's poor judgement and reasoning, recommend constant supervision upon discharge. SLP services will continue to f/u acutely. Follow-up services also recommended at next level of care.    HPI HPI: Pt is a 66 y.o. female who presented with AMS and blurred vision for a few days. MRI revealed multifocal posterior circulation infarcts felt to be embolic per neurology. PMH: CVA, left carotid stenosis status post stent, hypertension, diabetes melitis. SLUMS 04/14/21: 22/26 with dififculty noted during generative naming, mentap manipulation, and recall.      SLP Plan  Continue with current plan of care      Recommendations for follow up therapy are one component of a multi-disciplinary discharge planning process, led by the attending physician.  Recommendations may be updated based on patient status, additional functional criteria and insurance authorization.    Recommendations                   Follow Up Recommendations: Skilled nursing-short term rehab (<3 hours/day) Assistance recommended at discharge: Frequent or constant Supervision/Assistance SLP  Visit Diagnosis: Cognitive communication deficit (E93.810) Plan: Continue with current plan of care          Avie Echevaria, MA, CCC-SLP Acute Rehabilitation Services Office Number: [phone]   Paulette Blanch  09/22/2021, 2:01 PM

## 2021-09-22 NOTE — TOC Transition Note (Signed)
Transition of Care [facility]) - CM/SW Discharge Note   Patient Details  Name: Jade [name] MRN: [id number] Date of Birth: 1956-06-27  Transition of Care [facility] CM/SW Contact:  Kermit Balo, RN Phone Number: 09/22/2021, 12:34 PM   Clinical Narrative:    Patient is discharging to [facility] today. She will transport via [facility]. Son is at the bedside and aware of the plan. Discharge packet at the desk and bedside RN updated.   Number for report: [phone]   Final next level of care: Skilled Nursing Facility Barriers to Discharge: No Barriers Identified   Patient Goals and CMS Choice   CMS Medicare.gov Compare Post Acute Care list provided to:: Patient Represenative (must comment) Choice offered to / list presented to : Adult Children  Discharge Placement              Patient chooses bed at: [facility] Patient to be transferred to facility by: [facility] Name of family member notified: [name] Patient and family notified of of transfer: 09/22/21  Discharge Plan and Services In-house Referral: Clinical Social Work Discharge Planning Services: CM Consult Post Acute Care Choice: Skilled Nursing Facility                               Social Determinants of Health (SDOH) Interventions     Readmission Risk Interventions Readmission Risk Prevention Plan 06/30/2021  Transportation Screening Complete  PCP or Specialist Appt within 3-5 Days Complete  HRI or Home Care Consult Complete  Social Work Consult for Recovery Care Planning/Counseling Complete  Palliative Care Screening Not Applicable  Some recent data might be hidden

## 2021-11-01 ENCOUNTER — Encounter: Payer: Self-pay | Admitting: Neurology

## 2021-11-01 ENCOUNTER — Inpatient Hospital Stay: Payer: Medicare Other | Admitting: Neurology

## 2022-01-06 ENCOUNTER — Emergency Department (HOSPITAL_COMMUNITY): Payer: Medicare Other

## 2022-01-06 ENCOUNTER — Inpatient Hospital Stay (HOSPITAL_COMMUNITY)
Admission: EM | Admit: 2022-01-06 | Discharge: 2022-01-11 | DRG: 392 | Disposition: A | Discharge status: Skilled Nursing Facility | Payer: Medicare Other | Attending: Family Medicine | Admitting: Family Medicine

## 2022-01-06 DIAGNOSIS — N390 Urinary tract infection, site not specified: Secondary | ICD-10-CM | POA: Diagnosis present

## 2022-01-06 DIAGNOSIS — Z888 Allergy status to other drugs, medicaments and biological substances status: Secondary | ICD-10-CM

## 2022-01-06 DIAGNOSIS — Z66 Do not resuscitate: Secondary | ICD-10-CM | POA: Diagnosis present

## 2022-01-06 DIAGNOSIS — J449 Chronic obstructive pulmonary disease, unspecified: Secondary | ICD-10-CM | POA: Diagnosis present

## 2022-01-06 DIAGNOSIS — E860 Dehydration: Secondary | ICD-10-CM | POA: Diagnosis present

## 2022-01-06 DIAGNOSIS — Z8616 Personal history of COVID-19: Secondary | ICD-10-CM

## 2022-01-06 DIAGNOSIS — R8271 Bacteriuria: Secondary | ICD-10-CM | POA: Diagnosis present

## 2022-01-06 DIAGNOSIS — M797 Fibromyalgia: Secondary | ICD-10-CM | POA: Diagnosis present

## 2022-01-06 DIAGNOSIS — E559 Vitamin D deficiency, unspecified: Secondary | ICD-10-CM | POA: Diagnosis present

## 2022-01-06 DIAGNOSIS — E1149 Type 2 diabetes mellitus with other diabetic neurological complication: Secondary | ICD-10-CM | POA: Diagnosis present

## 2022-01-06 DIAGNOSIS — Z79899 Other long term (current) drug therapy: Secondary | ICD-10-CM | POA: Diagnosis not present

## 2022-01-06 DIAGNOSIS — Z8711 Personal history of peptic ulcer disease: Secondary | ICD-10-CM

## 2022-01-06 DIAGNOSIS — E7800 Pure hypercholesterolemia, unspecified: Secondary | ICD-10-CM | POA: Diagnosis present

## 2022-01-06 DIAGNOSIS — E46 Unspecified protein-calorie malnutrition: Secondary | ICD-10-CM | POA: Diagnosis present

## 2022-01-06 DIAGNOSIS — I69351 Hemiplegia and hemiparesis following cerebral infarction affecting right dominant side: Secondary | ICD-10-CM

## 2022-01-06 DIAGNOSIS — Z515 Encounter for palliative care: Secondary | ICD-10-CM | POA: Diagnosis not present

## 2022-01-06 DIAGNOSIS — Z806 Family history of leukemia: Secondary | ICD-10-CM | POA: Diagnosis not present

## 2022-01-06 DIAGNOSIS — I10 Essential (primary) hypertension: Secondary | ICD-10-CM | POA: Diagnosis not present

## 2022-01-06 DIAGNOSIS — Z87891 Personal history of nicotine dependence: Secondary | ICD-10-CM

## 2022-01-06 DIAGNOSIS — I69998 Other sequelae following unspecified cerebrovascular disease: Secondary | ICD-10-CM | POA: Diagnosis not present

## 2022-01-06 DIAGNOSIS — Z8249 Family history of ischemic heart disease and other diseases of the circulatory system: Secondary | ICD-10-CM | POA: Diagnosis not present

## 2022-01-06 DIAGNOSIS — I129 Hypertensive chronic kidney disease with stage 1 through stage 4 chronic kidney disease, or unspecified chronic kidney disease: Secondary | ICD-10-CM | POA: Diagnosis present

## 2022-01-06 DIAGNOSIS — M549 Dorsalgia, unspecified: Secondary | ICD-10-CM | POA: Diagnosis present

## 2022-01-06 DIAGNOSIS — M25511 Pain in right shoulder: Secondary | ICD-10-CM | POA: Diagnosis present

## 2022-01-06 DIAGNOSIS — I672 Cerebral atherosclerosis: Secondary | ICD-10-CM | POA: Diagnosis present

## 2022-01-06 DIAGNOSIS — E1122 Type 2 diabetes mellitus with diabetic chronic kidney disease: Secondary | ICD-10-CM | POA: Diagnosis present

## 2022-01-06 DIAGNOSIS — Z9049 Acquired absence of other specified parts of digestive tract: Secondary | ICD-10-CM

## 2022-01-06 DIAGNOSIS — D649 Anemia, unspecified: Secondary | ICD-10-CM | POA: Diagnosis present

## 2022-01-06 DIAGNOSIS — N1831 Chronic kidney disease, stage 3a: Secondary | ICD-10-CM | POA: Diagnosis present

## 2022-01-06 DIAGNOSIS — R5381 Other malaise: Secondary | ICD-10-CM | POA: Diagnosis present

## 2022-01-06 DIAGNOSIS — Z7982 Long term (current) use of aspirin: Secondary | ICD-10-CM

## 2022-01-06 DIAGNOSIS — R531 Weakness: Secondary | ICD-10-CM | POA: Diagnosis not present

## 2022-01-06 DIAGNOSIS — G4733 Obstructive sleep apnea (adult) (pediatric): Secondary | ICD-10-CM | POA: Diagnosis present

## 2022-01-06 DIAGNOSIS — A084 Viral intestinal infection, unspecified: Principal | ICD-10-CM | POA: Diagnosis present

## 2022-01-06 DIAGNOSIS — M25512 Pain in left shoulder: Secondary | ICD-10-CM | POA: Diagnosis present

## 2022-01-06 DIAGNOSIS — Z7189 Other specified counseling: Secondary | ICD-10-CM

## 2022-01-06 DIAGNOSIS — M8580 Other specified disorders of bone density and structure, unspecified site: Secondary | ICD-10-CM | POA: Diagnosis present

## 2022-01-06 DIAGNOSIS — Z789 Other specified health status: Secondary | ICD-10-CM | POA: Diagnosis not present

## 2022-01-06 DIAGNOSIS — I69398 Other sequelae of cerebral infarction: Secondary | ICD-10-CM

## 2022-01-06 LAB — COMPREHENSIVE METABOLIC PANEL
ALT: 16 U/L (ref 0–44)
AST: 27 U/L (ref 15–41)
Albumin: 3.8 g/dL (ref 3.5–5.0)
Alkaline Phosphatase: 66 U/L (ref 38–126)
Anion gap: 10 (ref 5–15)
BUN: 14 mg/dL (ref 8–23)
CO2: 22 mmol/L (ref 22–32)
Calcium: 9.6 mg/dL (ref 8.9–10.3)
Chloride: 105 mmol/L (ref 98–111)
Creatinine, Ser: 1 mg/dL (ref 0.44–1.00)
GFR, Estimated: >60 mL/min (ref >60)
Glucose, Bld: 202 mg/dL — ABNORMAL HIGH (ref 70–99)
Potassium: 3.9 mmol/L (ref 3.5–5.1)
Sodium: 137 mmol/L (ref 135–145)
Total Bilirubin: 0.8 mg/dL (ref 0.3–1.2)
Total Protein: 6.9 g/dL (ref 6.5–8.1)

## 2022-01-06 LAB — URINALYSIS, ROUTINE W REFLEX MICROSCOPIC
Bilirubin Urine: NEGATIVE
Glucose, UA: NEGATIVE mg/dL
Hgb urine dipstick: NEGATIVE
Ketones, ur: NEGATIVE mg/dL
Nitrite: POSITIVE — AB
Protein, ur: NEGATIVE mg/dL
Specific Gravity, Urine: 1.014 (ref 1.005–1.030)
WBC, UA: >50 WBC/hpf — ABNORMAL HIGH (ref 0–5)
pH: 5 (ref 5.0–8.0)

## 2022-01-06 LAB — CBC WITH DIFFERENTIAL/PLATELET
Abs Immature Granulocytes: 0.07 10*3/uL (ref 0.00–0.07)
Basophils Absolute: 0.1 10*3/uL (ref 0.0–0.1)
Basophils Relative: 1 %
Eosinophils Absolute: 0.1 10*3/uL (ref 0.0–0.5)
Eosinophils Relative: 1 %
HCT: 40.2 % (ref 36.0–46.0)
Hemoglobin: 13.2 g/dL (ref 12.0–15.0)
Immature Granulocytes: 1 %
Lymphocytes Relative: 11 %
Lymphs Abs: 1.3 10*3/uL (ref 0.7–4.0)
MCH: 28.6 pg (ref 26.0–34.0)
MCHC: 32.8 g/dL (ref 30.0–36.0)
MCV: 87.2 fL (ref 80.0–100.0)
Monocytes Absolute: 0.6 10*3/uL (ref 0.1–1.0)
Monocytes Relative: 6 %
Neutro Abs: 9 10*3/uL — ABNORMAL HIGH (ref 1.7–7.7)
Neutrophils Relative %: 80 %
Platelets: 372 10*3/uL (ref 150–400)
RBC: 4.61 MIL/uL (ref 3.87–5.11)
RDW: 18.2 % — ABNORMAL HIGH (ref 11.5–15.5)
WBC: 11.1 10*3/uL — ABNORMAL HIGH (ref 4.0–10.5)
nRBC: 0 % (ref 0.0–0.2)

## 2022-01-06 LAB — I-STAT CHEM 8, ED
BUN: 22 mg/dL (ref 8–23)
Calcium, Ion: 1.09 mmol/L — ABNORMAL LOW (ref 1.15–1.40)
Chloride: 104 mmol/L (ref 98–111)
Creatinine, Ser: 0.9 mg/dL (ref 0.44–1.00)
Glucose, Bld: 205 mg/dL — ABNORMAL HIGH (ref 70–99)
HCT: 41 % (ref 36.0–46.0)
Hemoglobin: 13.9 g/dL (ref 12.0–15.0)
Potassium: 6.5 mmol/L (ref 3.5–5.1)
Sodium: 135 mmol/L (ref 135–145)
TCO2: 24 mmol/L (ref 22–32)

## 2022-01-06 LAB — LIPASE, BLOOD: Lipase: 25 U/L (ref 11–51)

## 2022-01-06 MED ORDER — TIZANIDINE HCL 2 MG PO TABS
2.0000 mg | ORAL_TABLET | Freq: Every evening | ORAL | Status: DC | PRN
  Administered 2022-01-08 – 2022-01-09 (×2): 2 mg via ORAL
  Filled 2022-01-06 (×2): qty 1

## 2022-01-06 MED ORDER — ACETAMINOPHEN 325 MG PO TABS
650.0000 mg | ORAL_TABLET | Freq: Four times a day (QID) | ORAL | Status: DC | PRN
  Administered 2022-01-07 – 2022-01-09 (×4): 650 mg via ORAL
  Filled 2022-01-06 (×4): qty 2

## 2022-01-06 MED ORDER — ENOXAPARIN SODIUM 40 MG/0.4ML IJ SOSY
40.0000 mg | PREFILLED_SYRINGE | INTRAMUSCULAR | Status: DC
  Administered 2022-01-07 – 2022-01-11 (×5): 40 mg via SUBCUTANEOUS
  Filled 2022-01-06 (×5): qty 0.4

## 2022-01-06 MED ORDER — ENSURE ENLIVE PO LIQD
237.0000 mL | Freq: Two times a day (BID) | ORAL | Status: DC
  Administered 2022-01-07 – 2022-01-11 (×8): 237 mL via ORAL
  Filled 2022-01-06: qty 237

## 2022-01-06 MED ORDER — SODIUM CHLORIDE 0.9 % IV BOLUS
1000.0000 mL | Freq: Once | INTRAVENOUS | Status: AC
  Administered 2022-01-06: 1000 mL via INTRAVENOUS

## 2022-01-06 MED ORDER — ADULT MULTIVITAMIN W/MINERALS CH
ORAL_TABLET | Freq: Every day | ORAL | Status: DC
  Administered 2022-01-07 – 2022-01-11 (×5): 1 via ORAL
  Filled 2022-01-06 (×5): qty 1

## 2022-01-06 MED ORDER — ONDANSETRON HCL 4 MG/2ML IJ SOLN
4.0000 mg | Freq: Once | INTRAMUSCULAR | Status: AC
  Administered 2022-01-06: 4 mg via INTRAVENOUS
  Filled 2022-01-06: qty 2

## 2022-01-06 MED ORDER — VITAMIN D 25 MCG (1000 UNIT) PO TABS
1000.0000 [IU] | ORAL_TABLET | Freq: Every day | ORAL | Status: DC
  Administered 2022-01-07 – 2022-01-11 (×5): 1000 [IU] via ORAL
  Filled 2022-01-06 (×5): qty 1

## 2022-01-06 MED ORDER — POLYETHYLENE GLYCOL 3350 17 G PO PACK: 17.0000 g | PACK | Freq: Every day | ORAL | Status: DC | PRN

## 2022-01-06 MED ORDER — ATORVASTATIN CALCIUM 80 MG PO TABS
80.0000 mg | ORAL_TABLET | Freq: Every evening | ORAL | Status: DC
  Administered 2022-01-06 – 2022-01-10 (×5): 80 mg via ORAL
  Filled 2022-01-06 (×3): qty 1
  Filled 2022-01-06 (×2): qty 2

## 2022-01-06 MED ORDER — IOHEXOL 350 MG/ML SOLN
100.0000 mL | Freq: Once | INTRAVENOUS | Status: AC | PRN
  Administered 2022-01-06: 100 mL via INTRAVENOUS

## 2022-01-06 MED ORDER — SODIUM CHLORIDE 0.9 % IV SOLN
1.0000 g | INTRAVENOUS | Status: DC
  Administered 2022-01-07 – 2022-01-08 (×2): 1 g via INTRAVENOUS
  Filled 2022-01-06 (×2): qty 10

## 2022-01-06 MED ORDER — PANTOPRAZOLE SODIUM 40 MG PO TBEC
40.0000 mg | DELAYED_RELEASE_TABLET | Freq: Every day | ORAL | Status: DC
Start: 2022-01-07 — End: 2022-01-11
  Administered 2022-01-07 – 2022-01-11 (×5): 40 mg via ORAL
  Filled 2022-01-06 (×5): qty 1

## 2022-01-06 MED ORDER — ACETAMINOPHEN 650 MG RE SUPP: 650.0000 mg | Freq: Four times a day (QID) | RECTAL | Status: DC | PRN

## 2022-01-06 MED ORDER — ASPIRIN EC 81 MG PO TBEC
81.0000 mg | DELAYED_RELEASE_TABLET | Freq: Every day | ORAL | Status: DC
  Administered 2022-01-07 – 2022-01-11 (×5): 81 mg via ORAL
  Filled 2022-01-06 (×5): qty 1

## 2022-01-06 MED ORDER — CEFTRIAXONE SODIUM 1 G IJ SOLR
1.0000 g | Freq: Once | INTRAMUSCULAR | Status: AC
  Administered 2022-01-06: 1 g via INTRAVENOUS
  Filled 2022-01-06: qty 10

## 2022-01-06 MED ORDER — AMLODIPINE BESYLATE 10 MG PO TABS
10.0000 mg | ORAL_TABLET | Freq: Every day | ORAL | Status: DC
  Administered 2022-01-07 – 2022-01-11 (×5): 10 mg via ORAL
  Filled 2022-01-06: qty 1
  Filled 2022-01-06: qty 2
  Filled 2022-01-06 (×3): qty 1

## 2022-01-06 NOTE — ED Notes (Signed)
Critical lab given to MD  ?

## 2022-01-06 NOTE — ED Provider Notes (Signed)
?[facility] ?Provider Note ? ? ?CSN: [id number] ?Arrival date & time: 01/06/22  1701 ? ?  ? ?History ? ?No chief complaint on file. ? ? ?Jade [name] is a 66 y.o. female history of multiple strokes on Plavix, here presenting with weakness.  Patient had a stroke back in January.  She was in nursing home for some time and now is home.  Patient states that she is so weak she is unable to walk now.  She states that after the stroke, she is weaker on the right side.  She states that the last several days she has been having abdominal pain and vomiting and just weaker to the point that she cannot walk.  Patient was noted to have some diarrhea when EMS arrived.  ? ?The history is provided by the patient and the EMS personnel.  ? ?  ? ?Home Medications ?Prior to Admission medications   ?Medication Sig Start Date End Date Taking? Authorizing Provider  ?acetaminophen (TYLENOL) 325 MG tablet Take 2 tablets (650 mg total) by mouth every 6 (six) hours as needed for mild pain, moderate pain, fever or headache (or temp > 37.5 C (99.5 F)). 09/22/21   Elgergawy, Silver Huguenin, MD  ?amLODipine (NORVASC) 10 MG tablet Take 10 mg by mouth daily. 08/29/21   [provider]  ?aspirin EC 81 MG tablet Take 81 mg by mouth daily. Swallow whole.    [provider]  ?atorvastatin (LIPITOR) 80 MG tablet Take 80 mg by mouth daily.    [provider]  ?B Complex-C (B-COMPLEX WITH VITAMIN C) tablet Take 1 tablet by mouth daily.    [provider]  ?cholecalciferol (VITAMIN D3) 25 MCG (1000 UNIT) tablet Take 1,000 Units by mouth daily.    [provider]  ?loperamide (IMODIUM) 2 MG capsule Take 2 capsules (4 mg total) by mouth 2 (two) times daily as needed for diarrhea or loose stools. 06/30/21   Scarlett Presto, MD  ?Multiple Vitamins-Minerals (MULTIVITAMIN ADULTS 50+ PO) Take 1 tablet by mouth daily.    [provider]  ?Multiple Vitamins-Minerals (ZINC PO) Take  1 tablet by mouth daily.    [provider]  ?pantoprazole (PROTONIX) 40 MG tablet Take 1 tablet (40 mg total) by mouth 2 (two) times daily. Please take 40 mg oral twice daily x12 weeks, then take 40 mg oral once daily. 09/22/21   Elgergawy, Silver Huguenin, MD  ?tiZANidine (ZANAFLEX) 2 MG tablet Take 2 mg by mouth at bedtime.    [provider]  ?   ? ?Allergies    ?Baclofen and Nickel   ? ?Review of Systems   ?Review of Systems  ?Neurological:  Positive for weakness.  ?All other systems reviewed and are negative. ? ?Physical Exam ?Updated Vital Signs ?BP 114/60   Pulse 85   Temp 98.2 ?F (36.8 ?C) (Oral)   Resp (!) 22   SpO2 99%  ?Physical Exam ?Vitals and nursing note reviewed.  ?Constitutional:   ?   Comments: Chronically ill, dehydrated  ?HENT:  ?   Head: Normocephalic.  ?   Nose: Nose normal.  ?   Mouth/Throat:  ?   Mouth: Mucous membranes are dry.  ?Eyes:  ?   Extraocular Movements: Extraocular movements intact.  ?   Pupils: Pupils are equal, round, and reactive to light.  ?Cardiovascular:  ?   Rate and Rhythm: Normal rate and regular rhythm.  ?   Pulses: Normal pulses.  ?  Heart sounds: Normal heart sounds.  ?Pulmonary:  ?   Effort: Pulmonary effort is normal.  ?   Breath sounds: Normal breath sounds.  ?Abdominal:  ?   General: Abdomen is flat.  ?   Comments: + epigastric tenderness   ?Musculoskeletal:     ?   General: Normal range of motion.  ?   Cervical back: Normal range of motion and neck supple.  ?Skin: ?   General: Skin is warm.  ?Neurological:  ?   Comments: Contracted on the right side and strength is 3 out of 5 on the right arm and 3 out of 5 in the right leg.  Strength is 5 out of 5 on the left side  ?Psychiatric:     ?   Mood and Affect: Mood normal.  ? ? ?ED Results / Procedures / Treatments   ?Labs ?(all labs ordered are listed, but only abnormal results are displayed) ?Labs Reviewed  ?CBC WITH DIFFERENTIAL/PLATELET - Abnormal; Notable for the following components:  ?    Result  Value  ? WBC 11.1 (*)   ? RDW 18.2 (*)   ? Neutro Abs 9.0 (*)   ? All other components within normal limits  ?COMPREHENSIVE METABOLIC PANEL - Abnormal; Notable for the following components:  ? Glucose, Bld 202 (*)   ? All other components within normal limits  ?URINALYSIS, ROUTINE W REFLEX MICROSCOPIC - Abnormal; Notable for the following components:  ? APPearance HAZY (*)   ? Nitrite POSITIVE (*)   ? Leukocytes,Ua MODERATE (*)   ? WBC, UA >50 (*)   ? Bacteria, UA MANY (*)   ? All other components within normal limits  ?I-STAT CHEM 8, ED - Abnormal; Notable for the following components:  ? Potassium 6.5 (*)   ? Glucose, Bld 205 (*)   ? Calcium, Ion 1.09 (*)   ? All other components within normal limits  ?LIPASE, BLOOD  ?BLOOD GAS, VENOUS  ? ? ?EKG ?EKG Interpretation ? ?Date/Time:  Saturday Jan 06 2022 17:22:51 EDT ?Ventricular Rate:  83 ?PR Interval:  161 ?QRS Duration: 111 ?QT Interval:  389 ?QTC Calculation: 458 ?R Axis:   129 ?Text Interpretation: Sinus rhythm Left posterior fascicular block Anterior infarct, old Nonspecific T abnormalities, inferior leads No significant change since last tracing Confirmed by Wandra Arthurs [phone]) on 01/06/2022 5:25:59 PM ? ?Radiology ?CT ANGIO HEAD NECK W WO CM ? ?Result Date: 01/06/2022 ?CLINICAL DATA:  Initial evaluation for dizziness. EXAM: CT ANGIOGRAPHY HEAD AND NECK TECHNIQUE: Multidetector CT imaging of the head and neck was performed using the standard protocol during bolus administration of intravenous contrast. Multiplanar CT image reconstructions and MIPs were obtained to evaluate the vascular anatomy. Carotid stenosis measurements (when applicable) are obtained utilizing NASCET criteria, using the distal internal carotid diameter as the denominator. RADIATION DOSE REDUCTION: This exam was performed according to the departmental dose-optimization program which includes automated exposure control, adjustment of the mA and/or kV according to patient size and/or use of  iterative reconstruction technique. CONTRAST:  162mL OMNIPAQUE IOHEXOL 350 MG/ML SOLN COMPARISON:  Prior study from 09/20/2021. FINDINGS: CT HEAD FINDINGS Brain: Advanced cerebral atrophy with chronic small vessel ischemic disease. Chronic left frontal infarct noted. Additional chronic posterior right parietotemporal and left occipital infarcts, new from prior. No acute intracranial hemorrhage. No acute large vessel territory infarct. No mass lesion or midline shift. No hydrocephalus or extra-axial fluid collection. Benign arachnoid cyst noted at the right middle cranial fossa. Vascular: No hyperdense vessel. Skull:  Scalp soft tissues and calvarium demonstrate no acute finding. Sinuses: Acute left sphenoid sinusitis noted. Paranasal sinuses are otherwise clear. No mastoid effusion. Orbits: Globes and orbital soft tissues demonstrate no acute finding. Review of the MIP images confirms the above findings CTA NECK FINDINGS Aortic arch: Visualized aortic arch normal caliber with standard branching pattern. Moderate atheromatous change about the arch. No progressive stenosis about the origin of the great vessels. Right carotid system: Right common and internal carotid arteries are tortuous. Eccentric calcified plaque about the right carotid bulb/proximal right ICA, mildly progressed from prior. No hemodynamically significant greater than 50% stenosis. No dissection or occlusion. Left carotid system: Left common and internal carotid arteries are tortuous. Vascular stent spans the left carotid bulb/proximal left ICA. Stable stent patency. No dissection or occlusion. Vertebral arteries: Both vertebral arteries arise from subclavian arteries. Approximate 50% proximal left subclavian artery stenosis (series 11, image 245). Atheromatous plaque at the origin of the right vertebral artery with no more than mild stenosis. Vertebral arteries tortuous but widely patent distally without stenosis or dissection. Skeleton: No discrete  or worrisome osseous lesions. Degenerative spondylosis noted at C3-4. Other neck: No other acute soft tissue abnormality within the neck. Upper chest: Visualized upper chest demonstrates no acute finding. Revi

## 2022-01-06 NOTE — H&P (Signed)
Family Medicine Teaching Service ?Hospital Admission History and Physical ?Service Pager: [phone] ? ?Patient name: Jade [name] Medical record number: [id number] ?Date of birth: 1956-04-05 Age: 66 y.o. Gender: female ? ?Primary Care Provider: Sue Lush, PA-C ?Consultants: None ?Code Status: Full ? ?Chief Complaint: Weakness ? ?Assessment and Plan: ?Jade [name] is a 66 y.o. female presenting with generalized weakness in the setting of likely viral gastroenteritis, UTI, and residual weakness from prior CVA. PMH is significant for multiple CVAs with residual weakness greater on the right side, left carotid stenosis s/p stent, HTN, diet controlled T2DM, HLD, former smoker, COPD. ? ?Weakness, multifactorial ?History of CVA with residual right-sided weakness ?Dehydration secondary to viral gastroenteritis ?Patient at baseline with significant right-sided weakness secondary to multiple CVAs most recently January 2023 presenting with gradually worsening weakness in the past several weeks, also with recent development of nausea, vomiting, and diarrhea suggestive of viral gastroenteritis.  Clinically appears dehydrated requiring IV fluids in the ED.  CTA head and neck did not reveal any acute changes but did reveal new chronic infarcts in the left PCA and posterior right MCA distribution.  CT abdomen without any acute findings.  Given profound weakness, suspect she will require SNF as she is not able to get adequate care at home. ?- admit to [facility] [place] [facility], [place] attending ?- continue ASA 81 mg daily ?- s/p NS 1000 cc bolus in the ED, consider repeat pending clinical status ?- PT/OT ?- TOC for SNF placement ? ?UTI ?Symptomatic with dysuria and urinary frequency with UA suggestive of UTI with positive leukocytes and nitrites. ?- s/p CTX in the ED, can likely transition to PO abx ? ?HTN ?Long-term SBP goal 130-150 given advanced intracranial atherosclerosis. ?- continue amlodipine ? ?HLD ?- continue  atorvastatin 80 mg daily ? ?Diet controlled T2DM ?Last A1c 5.7 in January 2023. ?- monitor glucose on BMP ? ?Vitamin D deficiency ?- continue home vitamin D supplement ? ?Back pain ?- continue home tizanidine 2 mg qhs, ordered as prn ? ?Malnutrition ?- Ensure BID (patient taking Ensure at home) ? ?Duodenal ulcer ?Underwent EGD during most recent hospitalization January 2023 noted to have nonbleeding duodenal ulcer. ?- continue pantoprazole 40 mg daily ? ?Goals of care ?Significant physical disability from most recent CVA in January 2023, patient has significant frustration as she requires significant assistance with ADLs. ?- consult palliative care ? ?FEN/GI: Heart healthy ?Prophylaxis: Enoxaparin ? ?Disposition: Likely will require SNF ? ?History of Present Illness:  Jade [name] is a 66 y.o. female presenting with weakness. ? ?Patient has a history of multiple strokes, patient states at least 5-6 strokes.  Most recent stroke was in January and she has had residual right-sided weakness since that stroke.  She did get discharged to SNF and was subsequently discharged home.  She has been at home for the past few months and her son has primarily been taking care of her.  She was still weak when she was discharged from SNF and has not noticed any improvement in her strength since being discharged home, in fact she believes her weakness worsened about 2 months ago. ? ?She also reports symptoms of dysuria and frequency.  She has had nausea, vomiting, diarrhea, cough, and sore throat for the past few days.  She had multiple episodes of vomiting yesterday but none today so far. ? ?Patient states that she was brought in today by her son because he is no longer able to take care of her at home.  He  has been helping her with her medications and has been bathing her at least once a day, also helps her with toileting.  He is also working full-time.  When he is not around, she does not move around unless she has to.  She  believes she is not getting enough support at home despite how hard her son tries especially given his other responsibilities.  She is hoping that she can go to a rehab or nursing facility. ? ?She expressed frustration that she has not been able to regain her strength since her most recent stroke and is frustrated that she is not able to get around like she used to.  She feels that she is losing hope.  I discussed the idea of palliative care, and she was amenable to speaking to someone from the palliative care team. ? ?Review Of Systems: Per HPI with the following additions:  ? ?Review of Systems  ?Constitutional:  Negative for chills and fever.  ?HENT:  Positive for sore throat.   ?Respiratory:  Positive for cough.   ?Gastrointestinal:  Positive for diarrhea, nausea and vomiting. Negative for abdominal pain, blood in stool and melena.  ?Genitourinary:  Positive for dysuria and frequency.  ?Neurological:  Positive for dizziness, focal weakness, weakness and headaches.  ? ?Patient Active Problem List  ? Diagnosis Date Noted  ? Duodenal ulcer   ? Acute arterial ischemic stroke, multifocal, posterior circulation, unspecified laterality ([place]) 09/16/2021  ? Anemia 09/16/2021  ? COVID-19 06/25/2021  ? Cerebral thrombosis with cerebral infarction 06/19/2021  ? Weakness due to old stroke 06/17/2021  ? Labile blood glucose   ? Essential hypertension   ? Spastic hemiparesis ([place])   ? Stage 3a chronic kidney disease ([place])   ? Controlled type 2 diabetes mellitus with hyperglycemia, without long-term current use of insulin ([place])   ? CVA (cerebral vascular accident) ([place]) 04/13/2021  ? TIA (transient ischemic attack) 04/02/2021  ? Carotid artery stenosis with cerebral infarction ([place]) 04/02/2021  ? COPD (chronic obstructive pulmonary disease) ([place]) 04/02/2021  ? Stroke-like symptoms 03/05/2021  ? Acute CVA (cerebrovascular accident) ([place]) 03/21/2018  ? Obesity, Class III, BMI 40-49.9 (morbid obesity) ([place]) 03/21/2018  ? IBS  (irritable bowel syndrome) 04/12/2015  ? Hair loss 10/20/2014  ? Primary osteoarthritis of left knee 09/29/2014  ? Visit for screening mammogram 08/30/2014  ? Routine general medical examination at a health care facility 08/30/2014  ? Tinea corporis 12/21/2013  ? Essential hypertension, benign 12/21/2013  ? Low back pain 08/24/2013  ? Patient noncompliant with statin medication 02/23/2013  ? H/O abnormal Pap smear 10/24/2012  ? Osteopenia 09/26/2012  ? Diabetic neuropathy, painful ([place]) 05/26/2012  ? Diabetes mellitus type 2 with neurological manifestations ([place]) 03/20/2012  ? Pure hypercholesterolemia 03/20/2012  ? Chronic venous insufficiency 03/20/2012  ? ? ?Past Medical History: ?Past Medical History:  ?Diagnosis Date  ? Arthritis   ? "back, knees, some in my left hip" (03/21/2018)  ? Chronic kidney disease   ? "was told I had 25% kidney function in early 2012" (03/21/2018)  ? COPD (chronic obstructive pulmonary disease) ([place])   ? CVA (cerebral vascular accident) ([place]) 03/21/2018  ? "numb all over; head to toe" (03/21/2018)  ? Fibromyalgia   ? History of gout   ? Hyperlipidemia   ? Hypertension   ? Migraine   ? "used to get them monthly during menopause" (03/21/2018)  ? Neuromuscular disorder ([place])   ? OSA (obstructive sleep apnea)   ? "don't tolerate the machine" (03/21/2018)  ?  Pneumonia ~ 2007  ? Type 2 diabetes, diet controlled ([place])   ? ? ?Past Surgical History: ?Past Surgical History:  ?Procedure Laterality Date  ? BIOPSY  09/20/2021  ? Procedure: BIOPSY;  Surgeon: Thornton Park, MD;  Location: [facility];  Service: Gastroenterology;;  ? ESOPHAGOGASTRODUODENOSCOPY (EGD) WITH PROPOFOL N/A 09/20/2021  ? Procedure: ESOPHAGOGASTRODUODENOSCOPY (EGD) WITH PROPOFOL;  Surgeon: Thornton Park, MD;  Location: [facility];  Service: Gastroenterology;  Laterality: N/A;  ? HERNIA REPAIR    ? LAPAROSCOPIC CHOLECYSTECTOMY  06/2007  ? "no UHR w/this" (03/21/2018)  ? TONSILLECTOMY    ? TRANSCAROTID ARTERY  REVASCULARIZATION?  Left 04/06/2021  ? Procedure: LEFT TRANSCAROTID ARTERY REVASCULARIZATION;  Surgeon: Cherre Robins, MD;  Location: [facility] OR;  Service: Vascular;  Laterality: Left;  ? ULTRASOUND GUIDANCE FOR VASCULAR ACCESS Right

## 2022-01-06 NOTE — ED Notes (Signed)
Purewick place on pt.  

## 2022-01-06 NOTE — ED Triage Notes (Signed)
Pt BIB [facility] for [facility].  Pt had 1 episode at home and 2 with EMS.  Pt recently d/c from [facility] home.  Complains of Chronic bodyaches and weakness at baseline.  Per EMS, family states pt has had 14 strokes since August with right side deficits at baseline.  Pt complains of constipation, EMS she had large BM at home and it was loose.  ? ?Per EMS, family states pt is unmanageable at home.  ? ?EMS vitals, 138/74 100% RA, HR 80, CBG 206 ?

## 2022-01-07 ENCOUNTER — Other Ambulatory Visit: Payer: Self-pay

## 2022-01-07 ENCOUNTER — Encounter (HOSPITAL_COMMUNITY): Payer: Self-pay | Admitting: Family Medicine

## 2022-01-07 DIAGNOSIS — Z66 Do not resuscitate: Secondary | ICD-10-CM

## 2022-01-07 DIAGNOSIS — Z515 Encounter for palliative care: Secondary | ICD-10-CM

## 2022-01-07 DIAGNOSIS — Z789 Other specified health status: Secondary | ICD-10-CM

## 2022-01-07 DIAGNOSIS — I10 Essential (primary) hypertension: Secondary | ICD-10-CM | POA: Diagnosis not present

## 2022-01-07 DIAGNOSIS — I69998 Other sequelae following unspecified cerebrovascular disease: Secondary | ICD-10-CM

## 2022-01-07 DIAGNOSIS — N390 Urinary tract infection, site not specified: Secondary | ICD-10-CM | POA: Diagnosis not present

## 2022-01-07 DIAGNOSIS — I69398 Other sequelae of cerebral infarction: Secondary | ICD-10-CM | POA: Diagnosis not present

## 2022-01-07 LAB — BASIC METABOLIC PANEL
Anion gap: 10 (ref 5–15)
BUN: 9 mg/dL (ref 8–23)
CO2: 20 mmol/L — ABNORMAL LOW (ref 22–32)
Calcium: 9.3 mg/dL (ref 8.9–10.3)
Chloride: 108 mmol/L (ref 98–111)
Creatinine, Ser: 0.78 mg/dL (ref 0.44–1.00)
GFR, Estimated: >60 mL/min (ref >60)
Glucose, Bld: 118 mg/dL — ABNORMAL HIGH (ref 70–99)
Potassium: 3.2 mmol/L — ABNORMAL LOW (ref 3.5–5.1)
Sodium: 138 mmol/L (ref 135–145)

## 2022-01-07 LAB — CBC
HCT: 39.1 % (ref 36.0–46.0)
Hemoglobin: 12.7 g/dL (ref 12.0–15.0)
MCH: 28.8 pg (ref 26.0–34.0)
MCHC: 32.5 g/dL (ref 30.0–36.0)
MCV: 88.7 fL (ref 80.0–100.0)
Platelets: 348 10*3/uL (ref 150–400)
RBC: 4.41 MIL/uL (ref 3.87–5.11)
RDW: 18.1 % — ABNORMAL HIGH (ref 11.5–15.5)
WBC: 9.3 10*3/uL (ref 4.0–10.5)
nRBC: 0 % (ref 0.0–0.2)

## 2022-01-07 MED ORDER — POTASSIUM CHLORIDE 20 MEQ PO PACK
40.0000 meq | PACK | Freq: Once | ORAL | Status: AC
  Administered 2022-01-07: 40 meq via ORAL
  Filled 2022-01-07: qty 2

## 2022-01-07 MED ORDER — LIDOCAINE 5 % EX PTCH
1.0000 | MEDICATED_PATCH | Freq: Every day | CUTANEOUS | Status: DC
  Administered 2022-01-08 – 2022-01-11 (×3): 1 via TRANSDERMAL
  Filled 2022-01-07 (×3): qty 1

## 2022-01-07 MED ORDER — ONDANSETRON 4 MG PO TBDP: 4.0000 mg | ORAL_TABLET | Freq: Three times a day (TID) | ORAL | Status: DC | PRN

## 2022-01-07 NOTE — Progress Notes (Signed)
Family Medicine Teaching Service ?Daily Progress Note ?Intern Pager: [phone] ? ?Patient name: Jade [name] Medical record number: [id number] ?Date of birth: June 13, 1956 Age: 66 y.o. Gender: female ? ?Primary Care Provider: Nathaneil Canary, PA-C ?Consultants: palliative care ?  Code Status: Full Code ? ?Pt Overview and Major Events to Date:  ?5/13 - admitted, started on CTX for UTI ? ?Assessment and Plan: ?Jade [name] is a 66 y.o. female presenting with generalized weakness in the setting of likely viral gastroenteritis, UTI, and residual weakness from prior CVA. PMH is significant for multiple CVAs with residual weakness greater on the right side, left carotid stenosis s/p stent, HTN, diet controlled T2DM, HLD, former smoker, COPD. ? ?Weakness, multifactorial ?History of CVA with residual right-sided weakness ?Dehydration secondary to viral gastroenteritis ?No further diarrhea or vomiting but still feeling nauseous. ?- continue ASA 81 mg daily ?- Zofran ODT 4 mg q8h prn ?- consider additional IV fluids if indicated ?- PT/OT ?- TOC for SNF placement, suspect will require long term placement ?  ?UTI ?- continue IV ceftriaxone ?- urine culture (after antibiotics) ?  ?HTN ?At goal. Long-term SBP goal 130-150 given advanced intracranial atherosclerosis. ?- continue amlodipine ?  ?HLD ?- continue atorvastatin 80 mg daily ?  ?Diet controlled T2DM ?- monitor glucose on BMP ?  ?Vitamin D deficiency ?- continue home vitamin D supplement ?  ?Back pain ?- continue home tizanidine 2 mg qhs, ordered as prn ?  ?Malnutrition ?- Ensure BID ?  ?Duodenal ulcer ?- continue pantoprazole 40 mg daily ?  ?Goals of care ?Significant physical disability from most recent CVA in January 2023, patient has significant frustration as she requires significant assistance with ADLs. ?- consult palliative care ? ?FEN/GI: heart healthy ?PPx: enoxaparin ? ?Disposition: likely SNF ? ?Subjective:  ?Patient reports she has had no further vomiting  or diarrhea since last night but still feeling nauseous. She states her son has already started funeral planning which she expresses some frustration about. She wants to make sure her son knows what to expect as far as what to expect for her condition going forward. ? ?Objective: ?Temp:  [98.2 ?F (36.8 ?C)] 98.2 ?F (36.8 ?C) (05/13 1719) ?Pulse Rate:  [74-90] 83 (05/14 0500) ?Resp:  [12-23] 16 (05/14 0500) ?BP: (103-175)/(48-96) 120/48 (05/14 0500) ?SpO2:  [95 %-100 %] 97 % (05/14 0500) ?Physical Exam: ?General: resting in bed comfortably, NAD ?Cardiovascular: RRR, no murmurs ?Respiratory: CTAB, no respiratory distress ?Abdomen: soft, non-tender, +BS ?Extremities: WWP, no edema ? ?Laboratory: ?Recent Labs  ?Lab 01/06/22 ?1715 01/06/22 ?1819 01/07/22 ?0411  ?WBC 11.1*  --  9.3  ?HGB 13.2 13.9 12.7  ?HCT 40.2 41.0 39.1  ?PLT 372  --  348  ? ?Recent Labs  ?Lab 01/06/22 ?1715 01/06/22 ?1819 01/07/22 ?0411  ?NA 137 135 138  ?K 3.9 6.5* 3.2*  ?CL 105 104 108  ?CO2 22  --  20*  ?BUN 14 22 9   ?CREATININE 1.00 0.90 0.78  ?CALCIUM 9.6  --  9.3  ?PROT 6.9  --   --   ?BILITOT 0.8  --   --   ?ALKPHOS 66  --   --   ?ALT 16  --   --   ?AST 27  --   --   ?GLUCOSE 202* 205* 118*  ? ? ? ? ?Imaging/Diagnostic Tests: ?CT ANGIO HEAD NECK W WO CM ? ?Result Date: 01/06/2022 ?CLINICAL DATA:  Initial evaluation for dizziness. EXAM: CT ANGIOGRAPHY HEAD AND NECK TECHNIQUE: Multidetector CT  imaging of the head and neck was performed using the standard protocol during bolus administration of intravenous contrast. Multiplanar CT image reconstructions and MIPs were obtained to evaluate the vascular anatomy. Carotid stenosis measurements (when applicable) are obtained utilizing NASCET criteria, using the distal internal carotid diameter as the denominator. RADIATION DOSE REDUCTION: This exam was performed according to the departmental dose-optimization program which includes automated exposure control, adjustment of the mA and/or kV according to  patient size and/or use of iterative reconstruction technique. CONTRAST:  OMNIPAQUE IOHEXOL 350 MG/ML SOLN COMPARISON:  Prior study from 09/20/2021. FINDINGS: CT HEAD FINDINGS Brain: Advanced cerebral atrophy with chronic small vessel ischemic disease. Chronic left frontal infarct noted. Additional chronic posterior right parietotemporal and left occipital infarcts, new from prior. No acute intracranial hemorrhage. No acute large vessel territory infarct. No mass lesion or midline shift. No hydrocephalus or extra-axial fluid collection. Benign arachnoid cyst noted at the right middle cranial fossa. Vascular: No hyperdense vessel. Skull: Scalp soft tissues and calvarium demonstrate no acute finding. Sinuses: Acute left sphenoid sinusitis noted. Paranasal sinuses are otherwise clear. No mastoid effusion. Orbits: Globes and orbital soft tissues demonstrate no acute finding. Review of the MIP images confirms the above findings CTA NECK FINDINGS Aortic arch: Visualized aortic arch normal caliber with standard branching pattern. Moderate atheromatous change about the arch. No progressive stenosis about the origin of the great vessels. Right carotid system: Right common and internal carotid arteries are tortuous. Eccentric calcified plaque about the right carotid bulb/proximal right ICA, mildly progressed from prior. No hemodynamically significant greater than 50% stenosis. No dissection or occlusion. Left carotid system: Left common and internal carotid arteries are tortuous. Vascular stent spans the left carotid bulb/proximal left ICA. Stable stent patency. No dissection or occlusion. Vertebral arteries: Both vertebral arteries arise from subclavian arteries. Approximate 50% proximal left subclavian artery stenosis (series 11, image 245). Atheromatous plaque at the origin of the right vertebral artery with no more than mild stenosis. Vertebral arteries tortuous but widely patent distally without stenosis or  dissection. Skeleton: No discrete or worrisome osseous lesions. Degenerative spondylosis noted at C3-4. Other neck: No other acute soft tissue abnormality within the neck. Upper chest: Visualized upper chest demonstrates no acute finding. Review of the MIP images confirms the above findings CTA HEAD FINDINGS Anterior circulation: Petrous segments patent bilaterally. Atheromatous change within the carotid siphons without significant or progressive stenosis. A1 segments patent. Normal anterior communicating artery complex. Both ACAs widely patent proximally. Atheromatous irregularity within the distal A2/A3 segments with associated mild-to-moderate stenoses. No M1 stenosis or occlusion. Normal MCA bifurcations. No proximal MCA branch occlusion. Severe atheromatous disease involving the distal MCA branches bilaterally. Progressive attenuation of the distal right MCA branches as compared to previous, consistent with recently identified right cerebral infarcts. Posterior circulation: Left V4 segment is widely patent to the vertebrobasilar junction. Left PICA patent. Atheromatous irregularity within the mid left V4 segment with associated mild-to-moderate stenosis, similar to previous. Left PICA patent. Basilar patent to its distal aspect without stenosis. Superior cerebellar arteries patent bilaterally. Both PCAs primarily supplied via the basilar. P1 segments remain patent. Focal severe right P2 stenosis, similar to previous. Right PCA irregular but remains patent distally. On the left, severe left P2 stenosis again seen. Left PCA severely attenuated and only faintly visible, but remains grossly patent to its distal aspect, progressed and worsened from prior. Finding also in keeping with the interval left PCA distribution infarct. Venous sinuses: Grossly patent allowing for timing the contrast bolus. Anatomic  variants: As above.  No aneurysm. Delayed phase: No pathologic enhancement. Review of the MIP images confirms  the above findings IMPRESSION: CT HEAD IMPRESSION: 1. No acute intracranial abnormality. 2. Chronic left PCA and posterior right MCA distribution infarcts, new from prior. Additional chronic left frontal infarc

## 2022-01-07 NOTE — TOC Initial Note (Signed)
Transition of Care (TOC) - Initial/Assessment Note  ? ? ?Patient Details  ?Name: Jade [name] ?MRN: [id number] ?Date of Birth: May 03, 1956 ? ?Transition of Care (TOC) CM/SW Contact:    ?Verdell Carmine, RN ?Phone Number: ?01/07/2022, 10:05 AM ? ?Clinical Narrative:                 ? 66 year old patient with multiple strokes admitted with dehydration after presenting with weakness and loose stools.  ?He son has been caring for her aqt home since she left SNF, but over the last 2 months she has become weaker and incurred  more strokes by CT scan.  ?She does get bathed and assisted to BR, however son works full time and is unable to stay with her 24/7. She wishes to be assessed for SNF placement, may need long term post SNF.  ?PT and OT consults are in for assessment. ? ?TOC will follow for needs, recommendations, and transition. ?Expected Discharge Plan: [place] ?  ? ? ?Patient Goals and CMS Choice ?  ?  ?  ? ?Expected Discharge Plan and Services ?Expected Discharge Plan: [place] ?  ?Discharge Planning Services: CM Consult ?  ?  ?                ?  ?  ?  ?  ?  ?  ?  ?  ?  ?  ? ?Prior Living Arrangements/Services ?  ?Lives with:: Self, Adult Children (Son cares for her but also has a full time job) ?Patient language and need for interpreter reviewed:: Yes ?       ?Need for Family Participation in Patient Care: Yes (Comment) ?Care giver support system in place?: Yes (comment) ?  ?Criminal Activity/Legal Involvement Pertinent to Current Situation/Hospitalization: No - Comment as needed ? ?Activities of Daily Living ?  ?  ? ?Permission Sought/Granted ?  ?  ?   ?   ?   ?   ? ?Emotional Assessment ?  ?  ?  ?Orientation: : Oriented to Self, Oriented to Place, Oriented to  Time ?Alcohol / Substance Use: Not Applicable ?Psych Involvement: No (comment) ? ?Admission diagnosis:  Weakness following cerebrovascular accident (CVA) [id number], R53.1] ?Patient Active Problem List  ? Diagnosis Date  Noted  ? Weakness following cerebrovascular accident (CVA) 01/06/2022  ? Duodenal ulcer   ? Acute arterial ischemic stroke, multifocal, posterior circulation, unspecified laterality ([place]) 09/16/2021  ? Anemia 09/16/2021  ? COVID-19 06/25/2021  ? Cerebral thrombosis with cerebral infarction 06/19/2021  ? Weakness due to old stroke 06/17/2021  ? Labile blood glucose   ? Essential hypertension   ? Spastic hemiparesis ([place])   ? Stage 3a chronic kidney disease ([place])   ? Controlled type 2 diabetes mellitus with hyperglycemia, without long-term current use of insulin ([place])   ? CVA (cerebral vascular accident) ([place]) 04/13/2021  ? TIA (transient ischemic attack) 04/02/2021  ? Carotid artery stenosis with cerebral infarction ([place]) 04/02/2021  ? COPD (chronic obstructive pulmonary disease) ([place]) 04/02/2021  ? Stroke-like symptoms 03/05/2021  ? Acute CVA (cerebrovascular accident) ([place]) 03/21/2018  ? Obesity, Class III, BMI 40-49.9 (morbid obesity) ([place]) 03/21/2018  ? IBS (irritable bowel syndrome) 04/12/2015  ? Hair loss 10/20/2014  ? Primary osteoarthritis of left knee 09/29/2014  ? Visit for screening mammogram 08/30/2014  ? Routine general medical examination at a health care facility 08/30/2014  ? Tinea corporis 12/21/2013  ? Essential hypertension, benign 12/21/2013  ? Low back pain 08/24/2013  ?  Patient noncompliant with statin medication 02/23/2013  ? H/O abnormal Pap smear 10/24/2012  ? Osteopenia 09/26/2012  ? Diabetic neuropathy, painful ([place]) 05/26/2012  ? Diabetes mellitus type 2 with neurological manifestations ([place]) 03/20/2012  ? Pure hypercholesterolemia 03/20/2012  ? Chronic venous insufficiency 03/20/2012  ? ?PCP:  Sue Lush, PA-C ?Pharmacy:   ?[facility] ?[place] [address] ?Phone: [phone] Fax: [phone] ? ? ? ? ?Social Determinants of Health (SDOH) Interventions ?  ? ?Readmission Risk Interventions ? ?   06/30/2021  ?  1:48 PM  ?Readmission Risk Prevention Plan  ?Transportation Screening Complete  ?PCP or Specialist Appt within 3-5 Days Complete  ?[place] or Home Care Consult Complete  ?Social Work Consult for [place] Planning/Counseling Complete  ?Palliative Care Screening Not Applicable  ? ? ? ?

## 2022-01-07 NOTE — Evaluation (Signed)
Physical Therapy Evaluation ?Patient Details ?Name: Jade [name] ?MRN: [id number] ?DOB: 02-12-1956 ?Today's Date: 01/07/2022 ? ?History of Present Illness ? 66 y.o. female presenting 01/06/22 with generalized weakness in the setting of likely viral gastroenteritis, UTI, and residual weakness from prior CVA. CT head left PCA and posterior right MCA distribution infarcts, new from prior. Additional chronic left frontal infarct is stable. PMH: arthritis, CKD, COPD, CVAs with R hemiparesis, fibromyalgia, gout, HTN, migraine, OSA, DM2 ?  ?Clinical Impression ? Pt presents with condition above and deficits mentioned below, see PT Problem List. PTA, she was living with her husband in a 1-level house with a ramp entrance, requiring assistance for transfers and to ambulate a very short distance with a RW. Pt reported usually being mod I for bed mobility though. Currently, pt displays R upper and lower extremity flexion pattern hypertonicity and weakness along with deficits balance and activity tolerance. She required maxA for all bed mobility and to transfer sit <> stand 3x with UE support today. Pt with knee buckling bil and 0/5 MMT of R quads today. She is at high risk for falls. Pt's husband and son are very supportive but both work, resulting in pt being home alone at times, thus pt would likely benefit from receiving rehab at a SNF at d/c. Will continue to follow acutely. ?   ? ?Recommendations for follow up therapy are one component of a multi-disciplinary discharge planning process, led by the attending physician.  Recommendations may be updated based on patient status, additional functional criteria and insurance authorization. ? ?Follow Up Recommendations Skilled nursing-short term rehab (<3 hours/day) ? ?  ?Assistance Recommended at Discharge Frequent or constant Supervision/Assistance  ?Patient can return home with the following ? Two people to help with walking and/or transfers;Two people to help with  bathing/dressing/bathroom;Assistance with cooking/housework;Direct supervision/assist for medications management;Direct supervision/assist for financial management;Assist for transportation;Help with stairs or ramp for entrance ? ?  ?Equipment Recommendations None recommended by PT  ?Recommendations for Other Services ?    ?  ?Functional Status Assessment Patient has had a recent decline in their functional status and demonstrates the ability to make significant improvements in function in a reasonable and predictable amount of time.  ? ?  ?Precautions / Restrictions Precautions ?Precautions: Fall ?Precaution Comments: R hemiplegia from prior CVAs ?Restrictions ?Weight Bearing Restrictions: No  ? ?  ? ?Mobility ? Bed Mobility ?Overal bed mobility: Needs Assistance ?Bed Mobility: Supine to Sit, Sit to Supine ?  ?  ?Supine to sit: Max assist, HOB elevated ?Sit to supine: Max assist ?  ?General bed mobility comments: MaxA to manage trunk and legs with all bed mobility aspects, attempts to assist with leg management ?  ? ?Transfers ?Overall transfer level: Needs assistance ?Equipment used: 1 person hand held assist ?Transfers: Sit to/from Stand ?Sit to Stand: Max assist, From elevated surface ?  ?  ?  ?  ?  ?General transfer comment: x3 reps from stretcher with bil knee block and maxA under buttocks to power up to stand, maintains flexed posture ?  ? ?Ambulation/Gait ?  ?  ?  ?  ?  ?  ?  ?General Gait Details: unable ? ?Stairs ?  ?  ?  ?  ?  ? ?Wheelchair Mobility ?  ? ?Modified Rankin (Stroke Patients Only) ?Modified Rankin (Stroke Patients Only) ?Pre-Morbid Rankin Score: Moderately severe disability ?Modified Rankin: Severe disability ? ?  ? ?Balance Overall balance assessment: Needs assistance ?Sitting-balance support: Single extremity supported, Feet supported ?  Sitting balance-Leahy Scale: Poor ?Sitting balance - Comments: UE support and minA to sit EOB ?  ?Standing balance support: Bilateral upper extremity  supported ?Standing balance-Leahy Scale: Zero ?Standing balance comment: MaxA, UE support, and bil knee block to stand statically x3 reps of ~5 sec each ?  ?  ?  ?  ?  ?  ?  ?  ?  ?  ?  ?   ? ? ? ?Pertinent Vitals/Pain Pain Assessment ?Pain Assessment: 0-10 ?Pain Score: 10-Worst pain ever ?Pain Location: bil shoulders ?Pain Descriptors / Indicators: Aching ?Pain Intervention(s): Limited activity within patient's tolerance, Monitored during session, Repositioned  ? ? ?Home Living Family/patient expects to be discharged to:: Private residence ?Living Arrangements: Spouse/significant other ?Available Help at Discharge: Family;Available PRN/intermittently (son assists often also) ?Type of Home: House ?Home Access: Level entry ?  ?  ?  ?Home Layout: One level ?Home Equipment: Agricultural consultant (2 wheels);BSC/3in1;Wheelchair - manual;Shower seat;Tub bench;Grab bars - tub/shower;Grab bars - toilet;Hospital bed;Other (comment) (hoyer lift) ?Additional Comments: son and husband both work  ?  ?Prior Function Prior Level of Function : Needs assist ?  ?  ?  ?  ?  ?  ?Mobility Comments: Uses the RW for very short gait bouts with assistance occasionally but otherwise is in the wheelchair. Needs assistance for transfers. Can usually do bed mobility on her owns she states. ?ADLs Comments: Needs assistance for all ADLs, wears depends. ?  ? ? ?Hand Dominance  ? Dominant Hand: Right ? ?  ?Extremity/Trunk Assessment  ? Upper Extremity Assessment ?Upper Extremity Assessment: Defer to OT evaluation;RUE deficits/detail ?RUE Deficits / Details: hypertonicity in flexion pattern, min AROM noted ?  ? ?Lower Extremity Assessment ?Lower Extremity Assessment: RLE deficits/detail;LLE deficits/detail ?RLE Deficits / Details: Pt tends to hold leg in knee flexion and hip in external rotation, hypertonicity of hamstrings noted causing deficits in knee extension ROM (can achieve neutral with extended stretch), 0/5 MMT of quads when tested, min hip  flexion AROM noted ?LLE Deficits / Details: MMT scores of 3+ hip flexion, 4 knee extension ?  ? ?Cervical / Trunk Assessment ?Cervical / Trunk Assessment: Kyphotic  ?Communication  ? Communication: No difficulties  ?Cognition Arousal/Alertness: Awake/alert ?Behavior During Therapy: Flat affect ?Overall Cognitive Status: Impaired/Different from baseline ?  ?  ?  ?  ?  ?  ?  ?  ?  ?  ?  ?  ?  ?  ?  ?  ?General Comments: RN reporting pt was hallucinating earlier today. Pt was appropriately following commands during session and recalling home info correctly per husband that was present. Would benefit from further cog assessment. ?  ?  ? ?  ?General Comments General comments (skin integrity, edema, etc.): positioned pt with heels floating, knees extended, R hip internally rotated, R UE slightly elevated off trunk, R hand with towel roll, and in midline in bed end of session ? ?  ?Exercises    ? ?Assessment/Plan  ?  ?PT Assessment Patient needs continued PT services  ?PT Problem List Decreased strength;Decreased range of motion;Decreased activity tolerance;Decreased balance;Decreased mobility;Decreased cognition ? ?   ?  ?PT Treatment Interventions DME instruction;Gait training;Therapeutic activities;Functional mobility training;Balance training;Therapeutic exercise;Neuromuscular re-education;Cognitive remediation;Patient/family education;Wheelchair mobility training   ? ?PT Goals (Current goals can be found in the Care Plan section)  ?Acute Rehab PT Goals ?Patient Stated Goal: to go to SNF ?PT Goal Formulation: With patient/family ?Time For Goal Achievement: 01/21/22 ?Potential to Achieve Goals: Fair ? ?  ?Frequency  Min 3X/week ?  ? ? ?Co-evaluation   ?  ?  ?  ?  ? ? ?  ?AM-PAC PT "6 Clicks" Mobility  ?Outcome Measure Help needed turning from your back to your side while in a flat bed without using bedrails?: A Lot ?Help needed moving from lying on your back to sitting on the side of a flat bed without using bedrails?: A  Lot ?Help needed moving to and from a bed to a chair (including a wheelchair)?: Total ?Help needed standing up from a chair using your arms (e.g., wheelchair or bedside chair)?: A Lot ?Help needed to walk in hospital room?

## 2022-01-07 NOTE — ED Notes (Signed)
Pt fed lunch. Pericare performed. Bed pad and gown changed. Purewick changed. ?

## 2022-01-07 NOTE — Progress Notes (Addendum)
Family Medicine Teaching Service ?Daily Progress Note ?Intern Pager: [phone] ? ?Patient name: Jade [name] Medical record number: [id number] ?Date of birth: 08-23-1956 Age: 66 y.o. Gender: female ? ?Primary Care Provider: Sue Lush, PA-C ?Consultants: palliative care ?Code Status: Full ? ?Pt Overview and Major Events to Date:  ?5/13-admitted, started on CTX for UTI ? ?Assessment and Plan: ? ?[name] is a 66 yo female presenting with generalized weakness in the setting of likely viral gastroenteritis, UTI and residual weakness from prior CVA. 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 ?Weakness, multifactorial  Hx of CVA with residual R sided weakness  Dehydration 2/2 viral gastroenteritis ?Denies any issues overnight. On exam continues to have R sided weakness. PT recommended short term SNF-will reach out to them givne dense R extremity weakness, TOC following, likely needs long term SNF. ?-ASA 81 mg daily ?-zofran odt 4 mg q8h prn ?-consider additonal IVF if needed ?-PT/OT ?-D/c telemetry ? ?UTI ?Has remained afebrile. Denies any dysuria/abdominal pain currently Ucx pending (was obtained after abx started). >100,000 colonies gram negative rods. ?-IV CTX, try to transition after sensitivities tomorrow ? ?HTN  ?BP ranges 113-154/60-128. Goal SBP 130-150 d/t advance intracranial atherosclerosis ?-amlodipine ? ?[place] ?Significant physical disability from CVA in Jan 2023. Require significant assistance with ADLs. ?-palliative care ? ?Chronic/stable ?HLD-atorvastatin 80 mg  ?Diet controlled T2DM-glucose stable on BMP ?Vit D Deficiency-vitamin D supplement ?Back pain-tizanidine 2 mg qhs prn ?Malnutrition-Ensure BID ?Duodenal ulcer-protonix 40 mg ? ?FEN/GI: heart healthy ?PPx: enooxaparin ?Dispo: SNF  ? ?Subjective:  ?Patient denies any issues overnight.  Says that she was helped up  yesterday by son and has been sitting up during the day at some points.  Still feels pretty weak but denies any dysuria or abdominal pain. ? ?Objective: ?Temp:  [98.1 ?F (36.7 ?C)-99.1 ?F (37.3 ?C)] 98.8 ?F (37.1 ?C) (05/15 0800) ?Pulse Rate:  [65-94] 93 (05/15 0800) ?Resp:  [12-21] 21 (05/15 0800) ?BP: (113-154)/(60-128) 154/128 (05/15 0800) ?SpO2:  [94 %-100 %] 96 % (05/15 0800) ?Physical Exam: ?General:, Laying in bed comfortably, alert and responsive to all questions ?Cardiovascular: RRR no murmurs rubs or gallops ?Respiratory: Clear to auscultation bilaterally no wheezes rales or crackles ?Abdomen: Nontender to palpation, soft ?Extremities: No lower extremity edema ? ?Laboratory: ?Recent Labs  ?Lab 01/06/22 ?1715 01/06/22 ?1819 01/07/22 ?0411  ?WBC 11.1*  --  9.3  ?HGB 13.2 13.9 12.7  ?HCT 40.2 41.0 39.1  ?PLT 372  --  348  ? ?Recent Labs  ?Lab 01/06/22 ?1715 01/06/22 ?1819 01/07/22 ?0411 01/08/22 ?[id number]  ?NA 137 135 138 139  ?K 3.9 6.5* 3.2* 3.2*  ?CL 105 104 108 107  ?CO2 22  --  20* 23  ?BUN 14 22 9 9   ?CREATININE 1.00 0.90 0.78 0.80  ?CALCIUM 9.6  --  9.3 9.5  ?PROT 6.9  --   --   --   ?BILITOT 0.8  --   --   --   ?ALKPHOS 66  --   --   --   ?ALT 16  --   --   --   ?AST 27  --   --   --   ?GLUCOSE 202* 205* 118* 115*  ? ? ?Imaging/Diagnostic Tests: ?No results found.  ? ?Gerrit Heck, MD ?01/08/2022, 9:14 AM ?PGY-1, [facility] ?[place] Intern pager: [phone], text pages welcome  ?

## 2022-01-07 NOTE — Consult Note (Signed)
Consultation Note Date: 01/07/2022   Patient Name: Jade [name]  DOB: 1956/03/31  MRN: [id number]  Age / Sex: 66 y.o., female  PCP: Elinor Parkinson Referring Physician: Lind Covert, MD  Reason for Consultation: Establishing goals of care, "Patient with history of multiple strokes suffering from significant physical debility"  HPI/Patient Profile: Jade [name] is a 66 yo female presenting with generalized weakness in the setting of likely viral gastroenteritis, UTI and residual weakness from prior CVA. PMH significant for multiple CVAs with residual weakness greater on R side, L carotid stenosis s/p stent, HTN, diet controlled T2DM, HLD, former smoker, COPD   PMT has been consulted to assist with goals of care conversation.  Clinical Assessment and Goals of Care: I have reviewed medical records including EPIC notes, labs, and imaging. Received report from primary RN - no acute concerns. Reports patient has poor PO intake.    Went to visit patient at bedside - no family/visitors present. Patient was lying in bed awake, alert, oriented, and able to participate in conversation. No signs or non-verbal gestures of pain or discomfort noted. No respiratory distress, increased work of breathing, or secretions noted. Patient reports nausea and 7/10 shoulder/back pain. Requested RN provided antiemetic and pain medication.   Met with patient  to discuss diagnosis, prognosis, GOC, EOL wishes, disposition, and options.  I introduced Palliative Medicine as specialized medical care for people living with serious illness. It focuses on providing relief from the symptoms and stress of a serious illness. The goal is to improve quality of life for both the patient and the family.  We discussed a brief life review of the patient as well as functional and nutritional status. Patient and her husband have been  married 31 years - her husband speaks very limited English, which makes communicating with her children difficult who do not speak Spanish. She has one son and one daughter. Her son moved in with patient and her husband one year ago. They all live in a private residence together. Patient was able to ambulate independently before her stroke in [facility] 2023. Now, she is only able to stand with a cane for transfers. She has been to rehab, which she states "did not go well." Patient reports her appetite as being poor for about 6 months. Her albumin was noted at 3.8 on 01/06/22.  We discussed patient's current illness and what it means in the larger context of patient's on-going co-morbidities. Patient understands that CKD and COPD are progressive, non-curable disease underlying the her current acute medical conditions. Natural disease trajectory and expectations at EOL were discussed. I attempted to elicit values and goals of care important to the patient. The difference between aggressive medical intervention and comfort care was considered in light of the patient's goals of care. Patient is not ready for comfort only care, but does clearly state she does not wish for extremely aggressive heroic measures to prolong her life. She is open to returning to rehab to "give it a chance," despite not finding much benefit from previous visit.   Patient tells me that her husband and son both work and, at times, she is left alone in her home. She understands, from a safety standpoint, she needs additional caregiver help. Options after rehab were discussed to include returning home with private duty caregivers (OOP cost most likely) vs LTC. Patient is open to both of these options; however, understandably, her first choice is to return home with private duty caregivers to support  her family when they are not at home.   Recommended family meeting with patient's son, husband, and interpreter - to ensure family are  understanding of patient's current medical situation, goal/wishes, and anticipated trajectory and future needs. Patient is agreeable and appreciative.   Palliative Care services outpatient were explained and offered - she is agreeable.  Advance directives, concepts specific to code status, artificial feeding and hydration, and rehospitalization were considered and discussed. Patient does have a Living Will and HCPOA; however, she expresses desire to have it changed. She wants her son/[name] to be HCPOA - she is agreeable to have this completed in house. MOST form was introduced, reviewed, and completed as outlined under recommendation section below. Patient signed with "X" due to weakness on right side.  Encouraged patient to consider DNR/DNI status understanding evidenced based poor outcomes in similar hospitalized patient, as the cause of arrest is likely associated with advanced chronic/terminal illness rather than an easily reversible acute cardio-pulmonary event. I explained that DNR/DNI does not change the medical plan and it only comes into effect after a person has arrested (died).  It is a protective measure to keep [place] from harming the patient in their last moments of life. Patient was agreeable to DNR/DNI with understanding that she would not receive CPR, defibrillation, ACLS medications, or intubation.   Discussed with patient the importance of continued conversation with each other and the medical providers regarding overall plan of care and treatment options, ensuring decisions are within the context of the patient's values and GOCs.    Questions and concerns were addressed. The patient/family was encouraged to call with questions and/or concerns. PMT card was provided.   Primary Decision Maker: PATIENT    SUMMARY OF RECOMMENDATIONS   Continue current gentle medical interventions - patient would not want heroic measures taken to prolong her life Now DNR/DNI  PMT to assist in  scheduling family meeting with patient, her son, and husband. Patient's husband speaks Spanish with limited English - will need interpreter TOC consulted for: Outpatient Palliative Care referral MOST form completed as follows: DNR, Limited additional interventions (do not intubate; no heroic measures), determine use or limitation of antibiotics when infection occurs, no IVF, no FT Durable DNR form completed and placed in shadow chart. Copy of DNR and MOST form were made and will be scanned into Vynca/ACP tab PMT will continue to follow and support holistically   Code Status/Advance Care Planning: DNR  Palliative Prophylaxis:  Aspiration, Bowel Regimen, Delirium Protocol, Frequent Pain Assessment, Oral Care, and Turn Reposition  Additional Recommendations (Limitations, Scope, Preferences): Full Scope Treatment, No Hemodialysis, and No Tracheostomy  Psycho-social/Spiritual:  Desire for further Chaplaincy support:yes Created space and opportunity for patient to express thoughts and feelings regarding patient's current medical situation.  Emotional support and therapeutic listening provided.  Prognosis:  Unable to determine  Discharge Planning: [place] for rehab with Palliative care service follow-up      Primary Diagnoses: Present on Admission:  Diabetes mellitus type 2 with neurological manifestations [facility])  Essential hypertension, benign   I have reviewed the medical record, interviewed the patient and family, and examined the patient. The following aspects are pertinent.  Past Medical History:  Diagnosis Date   Arthritis    "back, knees, some in my left hip" (03/21/2018)   Chronic kidney disease    "was told I had 25% kidney function in early 2012" (03/21/2018)   COPD (chronic obstructive pulmonary disease) (HCC)    CVA (cerebral vascular accident) (  [place]) 03/21/2018   "numb all over; head to toe" (03/21/2018)   Fibromyalgia    History of gout     Hyperlipidemia    Hypertension    Migraine    "used to get them monthly during menopause" (03/21/2018)   Neuromuscular disorder (HCC)    OSA (obstructive sleep apnea)    "don't tolerate the machine" (03/21/2018)   Pneumonia ~ 2007   Type 2 diabetes, diet controlled ([place])    Social History   Socioeconomic History   Marital status: Married    Spouse name: Jose   Number of children: 1   Years of education: Not on file   Highest education level: Not on file  Occupational History   Occupation: Disability  Tobacco Use   Smoking status: Former    Packs/day: 1.25    Years: 16.00    Pack years: 20.00    Types: Cigarettes    Quit date: 08/28/1987    Years since quitting: 34.3   Smokeless tobacco: Never  Vaping Use   Vaping Use: Never used  Substance and Sexual Activity   Alcohol use: Not Currently   Drug use: Not Currently    Types: Marijuana   Sexual activity: Not Currently    Birth control/protection: Post-menopausal  Other Topics Concern   Not on file  Social History Narrative   Not on file   Social Determinants of Health   Financial Resource Strain: Not on file  Food Insecurity: Not on file  Transportation Needs: Not on file  Physical Activity: Not on file  Stress: Not on file  Social Connections: Not on file   Family History  Problem Relation Age of Onset   Heart disease Father    Leukemia Father    Alcohol abuse Son    Other Mother    Cancer Neg Hx    Diabetes Neg Hx    Hearing loss Neg Hx    Hyperlipidemia Neg Hx    Hypertension Neg Hx    Kidney disease Neg Hx    Stroke Neg Hx    Scheduled Meds:  amLODipine  10 mg Oral Daily   aspirin EC  81 mg Oral Daily   atorvastatin  80 mg Oral QPM   cholecalciferol  1,000 Units Oral Daily   enoxaparin (LOVENOX) injection  40 mg Subcutaneous Q24H   feeding supplement  237 mL Oral BID BM   lidocaine  1 patch Transdermal Daily   multivitamin with minerals   Oral Daily   pantoprazole  40 mg Oral Q1400   Continuous  Infusions:  cefTRIAXone (ROCEPHIN)  IV     PRN Meds:.acetaminophen **OR** acetaminophen, ondansetron, polyethylene glycol, tiZANidine Medications Prior to Admission:  Prior to Admission medications   Medication Sig Start Date End Date Taking? Authorizing Provider  acetaminophen (TYLENOL) 325 MG tablet Take 2 tablets (650 mg total) by mouth every 6 (six) hours as needed for mild pain, moderate pain, fever or headache (or temp > 37.5 C (99.5 F)). 09/22/21  Yes Elgergawy, Silver Huguenin, MD  amLODipine (NORVASC) 10 MG tablet Take 10 mg by mouth daily. 08/29/21  Yes [provider]  Artificial Tear Ointment (DRY EYES OP) Place 1 drop into both eyes 2 (two) times daily as needed (dry eyes).   Yes [provider]  aspirin EC 81 MG tablet Take 81 mg by mouth daily. Swallow whole.   Yes [provider]  atorvastatin (LIPITOR) 80 MG tablet Take 80 mg by mouth every evening.   Yes [provider]  B Complex-C (B-COMPLEX WITH VITAMIN C) tablet Take 1 tablet by mouth daily.   Yes [provider]  cholecalciferol (VITAMIN D3) 25 MCG (1000 UNIT) tablet Take 1,000 Units by mouth daily.   Yes [provider]  ibuprofen (ADVIL) 200 MG tablet Take 400 mg by mouth every 6 (six) hours as needed for headache or moderate pain.   Yes [provider]  loperamide (IMODIUM) 2 MG capsule Take 2 capsules (4 mg total) by mouth 2 (two) times daily as needed for diarrhea or loose stools. 06/30/21  Yes Demaio, Alexa, MD  Multiple Vitamins-Minerals (MULTIVITAMIN ADULTS 50+ PO) Take 1 tablet by mouth daily.   Yes [provider]  Multiple Vitamins-Minerals (ZINC PO) Take 1 tablet by mouth daily.   Yes [provider]  pantoprazole (PROTONIX) 40 MG tablet Take 1 tablet (40 mg total) by mouth 2 (two) times daily. Please take 40 mg oral twice daily x12 weeks, then take 40 mg oral once daily. Patient taking differently: Take 40 mg by mouth daily at 2 PM. 09/22/21   Yes Elgergawy, Silver Huguenin, MD  tiZANidine (ZANAFLEX) 2 MG tablet Take 2 mg by mouth at bedtime.   Yes [provider]   Allergies  Allergen Reactions   Baclofen Other (See Comments)    Pt said felt weaker and had slurred speech   Nickel Dermatitis and Rash   Review of Systems  Constitutional:  Positive for activity change. Negative for appetite change.  Respiratory:  Negative for shortness of breath.   Gastrointestinal:  Positive for nausea. Negative for vomiting.  Neurological:  Positive for weakness.  All other systems reviewed and are negative.  Physical Exam Vitals and nursing note reviewed.  Constitutional:      General: She is not in acute distress. Pulmonary:     Effort: No respiratory distress.  Skin:    General: Skin is warm and dry.  Neurological:     Mental Status: She is alert and oriented to person, place, and time.     Motor: Weakness present.  Psychiatric:        Attention and Perception: Attention normal.        Behavior: Behavior is cooperative.        Cognition and Memory: Cognition and memory normal.    Vital Signs: BP 135/63   Pulse 75   Temp 98.2 F (36.8 C) (Oral)   Resp 16   SpO2 100%      Pain Score: Asleep   SpO2: SpO2: 100 % O2 Device:SpO2: 100 % O2 Flow Rate: .   IO: Intake/output summary:  Intake/Output Summary (Last 24 hours) at 01/07/2022 1351 Last data filed at 01/07/2022 0308 Gross per 24 hour  Intake --  Output 800 ml  Net -800 ml    LBM:   Baseline Weight:   Most recent weight:       Palliative Assessment/Data: PPS 40%     Time In: 1330 Time Out: 1450 Time Total: 80 minutes  Greater than 50%  of this time was spent counseling and coordinating care related to the above assessment and plan.  Signed by: Lin Landsman, NP   Please contact Palliative Medicine Team phone at [phone] for questions and concerns.  For individual provider: See Amion  *Portions of this note are a verbal dictation therefore  any spelling and/or grammatical errors are due to the "[place]" system interpretation.

## 2022-01-07 NOTE — Progress Notes (Signed)
PT Cancellation Note ? ?Patient Details ?Name: Jade [name] ?MRN: [id number] ?DOB: [birth year] ? ? ?Cancelled Treatment:    Reason Eval/Treat Not Completed: Patient at procedure or test/unavailable ? ?Patient with another healthcare provider. Will attempt to return or see first thing on 5/15 ? ? ?Jerolyn Center, PT ?Acute Rehabilitation Services  ?Pager [phone] ?Office [phone] ? ?[name] ?01/07/2022, 2:26 PM ?

## 2022-01-07 NOTE — Progress Notes (Signed)
OT Cancellation Note ? ?Patient Details ?Name: Jade [name] ?MRN: [id number] ?DOB: Aug 23, 1956 ? ? ?Cancelled Treatment:    Reason Eval/Treat Not Completed: Patient at procedure or test/ unavailable.  ? ?Patient with another healthcare provider. Will attempt to return or see first thing on 5/15. ? ?[name] ?01/07/2022, 2:48 PM ?

## 2022-01-08 ENCOUNTER — Encounter (HOSPITAL_COMMUNITY): Payer: Self-pay | Admitting: Family Medicine

## 2022-01-08 DIAGNOSIS — R531 Weakness: Secondary | ICD-10-CM

## 2022-01-08 DIAGNOSIS — I69398 Other sequelae of cerebral infarction: Secondary | ICD-10-CM | POA: Diagnosis not present

## 2022-01-08 DIAGNOSIS — Z7189 Other specified counseling: Secondary | ICD-10-CM

## 2022-01-08 DIAGNOSIS — N390 Urinary tract infection, site not specified: Secondary | ICD-10-CM | POA: Diagnosis not present

## 2022-01-08 LAB — BASIC METABOLIC PANEL
Anion gap: 9 (ref 5–15)
BUN: 9 mg/dL (ref 8–23)
CO2: 23 mmol/L (ref 22–32)
Calcium: 9.5 mg/dL (ref 8.9–10.3)
Chloride: 107 mmol/L (ref 98–111)
Creatinine, Ser: 0.8 mg/dL (ref 0.44–1.00)
GFR, Estimated: >60 mL/min (ref >60)
Glucose, Bld: 115 mg/dL — ABNORMAL HIGH (ref 70–99)
Potassium: 3.2 mmol/L — ABNORMAL LOW (ref 3.5–5.1)
Sodium: 139 mmol/L (ref 135–145)

## 2022-01-08 MED ORDER — POTASSIUM CHLORIDE CRYS ER 20 MEQ PO TBCR
40.0000 meq | EXTENDED_RELEASE_TABLET | Freq: Once | ORAL | Status: AC
  Administered 2022-01-08: 40 meq via ORAL
  Filled 2022-01-08: qty 2

## 2022-01-08 NOTE — [place] FL2 (Signed)
?[place] MEDICAID FL2 LEVEL OF CARE SCREENING TOOL  ?  ? ?IDENTIFICATION  ?Patient Name: ?Jade [name] Birthdate: 01-27-1956 Sex: female Admission Date (Current Location): ?01/06/2022  ?[place] and [place] Number: ? [place] ?  Facility and Address:  ?[facility], [place] [address] [place], [place] [address] ?     Provider Number: ?[id number]  ?Attending Physician Name and Address:  ?Lind Covert, MD ? Relative Name and Phone Number:  ?Corene Cornea Teer [phone] ?   ?Current Level of Care: ?Hospital Recommended Level of Care: ?[place] Prior Approval Number: ?  ? ?Date Approved/Denied: ?  PASRR Number: ?[id number] ? ?Discharge Plan: ?SNF ?  ? ?Current Diagnoses: ?Patient Active Problem List  ? Diagnosis Date Noted  ? Weakness following cerebrovascular accident (CVA) 01/06/2022  ? Duodenal ulcer   ? Acute arterial ischemic stroke, multifocal, posterior circulation, unspecified laterality ([place]) 09/16/2021  ? Anemia 09/16/2021  ? COVID-19 06/25/2021  ? Cerebral thrombosis with cerebral infarction 06/19/2021  ? Weakness due to old stroke 06/17/2021  ? Labile blood glucose   ? Essential hypertension   ? Spastic hemiparesis ([place])   ? Stage 3a chronic kidney disease ([place])   ? Controlled type 2 diabetes mellitus with hyperglycemia, without long-term current use of insulin ([place])   ? CVA (cerebral vascular accident) ([place]) 04/13/2021  ? TIA (transient ischemic attack) 04/02/2021  ? Carotid artery stenosis with cerebral infarction ([place]) 04/02/2021  ? COPD (chronic obstructive pulmonary disease) ([place]) 04/02/2021  ? Stroke-like symptoms 03/05/2021  ? Acute CVA (cerebrovascular accident) ([place]) 03/21/2018  ? Obesity, Class III, BMI 40-49.9 (morbid obesity) ([place]) 03/21/2018  ? IBS (irritable bowel syndrome) 04/12/2015  ? Hair loss 10/20/2014  ? Primary osteoarthritis of left knee 09/29/2014  ? Visit for screening mammogram 08/30/2014  ? Routine general medical examination at a  health care facility 08/30/2014  ? Tinea corporis 12/21/2013  ? Essential hypertension, benign 12/21/2013  ? Low back pain 08/24/2013  ? Patient noncompliant with statin medication 02/23/2013  ? H/O abnormal Pap smear 10/24/2012  ? Osteopenia 09/26/2012  ? Diabetic neuropathy, painful ([place]) 05/26/2012  ? Diabetes mellitus type 2 with neurological manifestations ([place]) 03/20/2012  ? Pure hypercholesterolemia 03/20/2012  ? Chronic venous insufficiency 03/20/2012  ? ? ?Orientation RESPIRATION BLADDER Height & Weight   ?  ?Self, Time, Situation, Place ? Normal Incontinent, External catheter Weight:   ?Height:     ?BEHAVIORAL SYMPTOMS/MOOD NEUROLOGICAL BOWEL NUTRITION STATUS  ?    Colostomy Diet (See DC summary)  ?AMBULATORY STATUS COMMUNICATION OF NEEDS Skin   ?Extensive Assist Verbally Skin abrasions (Back and Sacrum Redness) ?  ?  ?  ?    ?     ?     ? ? ?Personal Care Assistance Level of Assistance  ?Bathing, Dressing, Feeding Bathing Assistance: Maximum assistance ?Feeding assistance: Limited assistance ?Dressing Assistance: Maximum assistance ?   ? ?Functional Limitations Info  ?Sight, Hearing, Speech Sight Info: Adequate ?Hearing Info: Adequate ?Speech Info: Adequate  ? ? ?SPECIAL CARE FACTORS FREQUENCY  ?PT (By licensed PT), OT (By licensed OT)   ?  ?PT Frequency: 5x week ?OT Frequency: 5x week ?  ?  ?  ?   ? ? ?Contractures Contractures Info: Not present  ? ? ?Additional Factors Info  ?Code Status, Allergies Code Status Info: DNR ?Allergies Info: Baclofen   Nickel ?  ?  ?  ?   ? ?Current Medications (01/08/2022):  This is the current hospital active  medication list ?Current Facility-Administered Medications  ?Medication Dose Route Frequency Provider Last Rate Last Admin  ? acetaminophen (TYLENOL) tablet 650 mg  650 mg Oral Q6H PRN Zola Button, MD   650 mg at 01/08/22 1349  ? Or  ? acetaminophen (TYLENOL) suppository 650 mg  650 mg Rectal Q6H PRN Zola Button, MD      ? amLODipine (NORVASC) tablet 10 mg  10 mg  Oral Daily Zola Button, MD   10 mg at 01/08/22 0946  ? aspirin EC tablet 81 mg  81 mg Oral Daily Zola Button, MD   81 mg at 01/08/22 0946  ? atorvastatin (LIPITOR) tablet 80 mg  80 mg Oral QPM Zola Button, MD   80 mg at 01/07/22 1750  ? cefTRIAXone (ROCEPHIN) 1 g in sodium chloride 0.9 % 100 mL IVPB  1 g Intravenous Q24H Zola Button, MD   Stopping previously hung infusion at 01/08/22 0705  ? cholecalciferol (VITAMIN D3) tablet 1,000 Units  1,000 Units Oral Daily Zola Button, MD   1,000 Units at 01/08/22 0946  ? enoxaparin (LOVENOX) injection 40 mg  40 mg Subcutaneous Q24H Zola Button, MD   40 mg at 01/08/22 0945  ? feeding supplement (ENSURE ENLIVE / ENSURE PLUS) liquid 237 mL  237 mL Oral BID BM Zola Button, MD   237 mL at 01/08/22 1354  ? lidocaine (LIDODERM) 5 % 1 patch  1 patch Transdermal Daily Ezequiel Essex, MD   1 patch at 01/08/22 0947  ? multivitamin with minerals tablet   Oral Daily Zola Button, MD   1 tablet at 01/08/22 0946  ? ondansetron (ZOFRAN-ODT) disintegrating tablet 4 mg  4 mg Oral Q8H PRN Zola Button, MD      ? pantoprazole (PROTONIX) EC tablet 40 mg  40 mg Oral Q1400 Zola Button, MD   40 mg at 01/08/22 1349  ? polyethylene glycol (MIRALAX / GLYCOLAX) packet 17 g  17 g Oral Daily PRN Zola Button, MD      ? tiZANidine (ZANAFLEX) tablet 2 mg  2 mg Oral QHS PRN Zola Button, MD      ? ? ? ?Discharge Medications: ?Please see discharge summary for a list of discharge medications. ? ?Relevant Imaging Results: ? ?Relevant Lab Results: ? ? ?Additional Information ?SS# [id number]. ? ?Coralee Pesa, LCSWA ? ? ? ? ?

## 2022-01-08 NOTE — Progress Notes (Signed)
? ?                                                                                                                                                     ?                                                   ?Daily Progress Note  ? ?Patient Name: Jade [name]       Date: 01/08/2022 ?DOB: [birth year]  Age: 66 y.o. MRN#: [id number] ?Attending Physician: Lind Covert, MD ?Primary Care Physician: Sue Lush, PA-C ?Admit Date: 01/06/2022 ? ?Reason for Consultation/Follow-up: Establishing goals of care ? ?Subjective: ?Medical records reviewed including progress notes, labs, imaging. Patient assessed at the bedside. She is tearful, telling me she is tired and with abdominal pain. Discussed PRN Tylenol with RN. Her husband Jade [name] is present at the bedside. ? ?Created space and opportunity for patient's thoughts and feelings on her current illness. She confirms her desire for a trial of rehab at SNF, while also sharing that she is not afraid to die and would be ready for hospice if it appears this does not improve her quality of life and she continues to suffer. Her wish is that her family supports her with love and lets her go peacefully when that time comes. We discussed complications associated with immobility and how this could result in suffering if she is unable to regain her strength. Discussed limited options for placement after SNF if family is still unable to care for her increasing needs for assistance. ? ?Reviewed patient's current illness with her spouse. He shares that they are actually divorced but now dating again. They have been together for 15 years and he is tearful describing his love and worry for her. He is appreciative of the update. We reviewed MOST form at length, discussing her choices and explaining concepts such as resuscitation, IV fluids, IV antibiotics and tube feeding. Jade [name] does not want her life to be prolonged with these measures if she can't have good, quality days to enjoy her  life - if she continues to decline she would rather focus on comfort even if her time on Earth was shorter. ? ?Reviewed SNF referral process and answered questions to family's satisfaction. Reviewed [name] confirms she wants to name her son Jade [name]. ? ?I then called patient's son Jade [name] to provide updates on the above conversation, emphasizing the importance of supporting the patient and prioritizing her wishes and goals of care above family's personal beliefs and preferences. ? ?Questions and concerns addressed. PMT will continue to support holistically. ? ? ?Length of Stay: 2 ? ?Current Medications: ?Scheduled Meds:  ? amLODipine  10 mg Oral Daily  ? aspirin EC  81 mg Oral Daily  ? atorvastatin  80 mg Oral QPM  ? cholecalciferol  1,000 Units Oral Daily  ? enoxaparin (LOVENOX) injection  40 mg Subcutaneous Q24H  ? feeding supplement  237 mL Oral BID BM  ? lidocaine  1 patch Transdermal Daily  ? multivitamin with minerals   Oral Daily  ? pantoprazole  40 mg Oral Q1400  ? ? ?Continuous Infusions: ? cefTRIAXone (ROCEPHIN)  IV Stopped (01/08/22 [id number])  ? ? ?PRN Meds: ?acetaminophen **OR** acetaminophen, ondansetron, polyethylene glycol, tiZANidine ? ?Physical Exam ?Vitals and nursing note reviewed.  ?Constitutional:   ?   General: She is not in acute distress. ?   Appearance: She is ill-appearing.  ?Cardiovascular:  ?   Rate and Rhythm: Normal rate.  ?Pulmonary:  ?   Effort: Pulmonary effort is normal. No respiratory distress.  ?Neurological:  ?   Mental Status: She is alert and oriented to person, place, and time.  ?Psychiatric:     ?   Mood and Affect: Affect is tearful.  ?         ? ?Vital Signs: BP (!) 165/95 (BP Location: Left Arm)   Pulse 98   Temp 98.8 ?F (37.1 ?C) (Oral)   Resp 18   SpO2 97%  ?SpO2: SpO2: 97 % ?O2 Device: O2 Device: Room Air ?O2 Flow Rate:   ? ?Intake/output summary:  ?Intake/Output Summary (Last 24 hours) at 01/08/2022 1309 ?Last data filed at 01/08/2022 0604 ?Gross per 24 hour  ?Intake  --  ?Output 700 ml  ?Net -700 ml  ? ?LBM: Last BM Date : 01/06/22 ?Baseline Weight:   ?Most recent weight:   ? ?     ?Palliative Assessment/Data: 40% ? ? ? ? ? ?Patient Active Problem List  ? Diagnosis Date Noted  ? Weakness following cerebrovascular accident (CVA) 01/06/2022  ? Duodenal ulcer   ? Acute arterial ischemic stroke, multifocal, posterior circulation, unspecified laterality ([place]) 09/16/2021  ? Anemia 09/16/2021  ? COVID-19 06/25/2021  ? Cerebral thrombosis with cerebral infarction 06/19/2021  ? Weakness due to old stroke 06/17/2021  ? Labile blood glucose   ? Essential hypertension   ? Spastic hemiparesis ([place])   ? Stage 3a chronic kidney disease ([place])   ? Controlled type 2 diabetes mellitus with hyperglycemia, without long-term current use of insulin ([place])   ? CVA (cerebral vascular accident) ([place]) 04/13/2021  ? TIA (transient ischemic attack) 04/02/2021  ? Carotid artery stenosis with cerebral infarction ([place]) 04/02/2021  ? COPD (chronic obstructive pulmonary disease) ([place]) 04/02/2021  ? Stroke-like symptoms 03/05/2021  ? Acute CVA (cerebrovascular accident) ([place]) 03/21/2018  ? Obesity, Class III, BMI 40-49.9 (morbid obesity) ([place]) 03/21/2018  ? IBS (irritable bowel syndrome) 04/12/2015  ? Hair loss 10/20/2014  ? Primary osteoarthritis of left knee 09/29/2014  ? Visit for screening mammogram 08/30/2014  ? Routine general medical examination at a health care facility 08/30/2014  ? Tinea corporis 12/21/2013  ? Essential hypertension, benign 12/21/2013  ? Low back pain 08/24/2013  ? Patient noncompliant with statin medication 02/23/2013  ? H/O abnormal Pap smear 10/24/2012  ? Osteopenia 09/26/2012  ? Diabetic neuropathy, painful ([place]) 05/26/2012  ? Diabetes mellitus type 2 with neurological manifestations ([place]) 03/20/2012  ? Pure hypercholesterolemia 03/20/2012  ? Chronic venous insufficiency 03/20/2012  ? ? ?Palliative Care Assessment & Plan  ? ?Patient Profile: ?Jade [name] is a 66 yo female  presenting with generalized weakness in the setting of  likely viral gastroenteritis, UTI and residual weakness from prior CVA. 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  ?PMT has been consulted to assist with goals of care conversation. ? ?Assessment: ?Goals of care conversation ?Viral gastroenteritis ?UTI ?Hx of CVA with residual R sided weakness  ? ?Recommendations/Plan: ?DNR ?Continue current care  ?Will assist with HCPOA documentation as able during hospitalization, patient wishes to name her son Jade [name] ?Goal is for SNF with palliative care follow up ?Appreciate TOC assistance with referral to outpatient palliative care ?PMT remains available for acute needs ? ? ? ?Prognosis: ? Guarded ? ?Discharge Planning: ?[place] for rehab with Palliative care service follow-up ? ?Care plan was discussed with Patient, patient's husband Jade [name], son Jade [name] ? ? ?Total time: ?I spent 65 minutes in the care of the patient today in the above activities and documenting the encounter. ?Prolonged time: Yes ? ? ?Dorthy Cooler, PA-C ?Palliative Medicine Team ?Team phone # [phone] ? ?Thank you for allowing the Palliative Medicine Team to assist in the care of this patient. Please utilize secure chat with additional questions, if there is no response within 30 minutes please call the above phone number. ? ?Palliative Medicine Team providers are available by phone from 7am to 7pm daily and can be reached through the team cell phone.  ?Should this patient require assistance outside of these hours, please call the patient's attending physician.  ? ? ? ?

## 2022-01-08 NOTE — Evaluation (Signed)
Occupational Therapy Evaluation ?Patient Details ?Name: Jade [name] ?MRN: [id number] ?DOB: [birth year] ?Today's Date: 01/08/2022 ? ? ?History of Present Illness 66 y.o. female presenting 01/06/22 with generalized weakness in the setting of likely viral gastroenteritis, UTI, and residual weakness from prior CVA. CT head left PCA and posterior right MCA distribution infarcts, new from prior. Additional chronic left frontal infarct is stable. PMH: arthritis, CKD, COPD, CVAs with R hemiparesis, fibromyalgia, gout, HTN, migraine, OSA, DM2  ? ?Clinical Impression ?  ?Pt lived with her husband who works, but would come by half way through the day to check on pt. Pt reports having assist for transfers and primarily relying on w/c for mobility, she was able to self propel. Pt presents with impaired cognition (no family available to determine baseline), dense R hemiplegia with no functional use of R UE, poor sitting balance and zero standing balance. She could self feed and groom with set up, but was otherwise assisted for all other ADLs. Recommending SNF. ?   ? ?Recommendations for follow up therapy are one component of a multi-disciplinary discharge planning process, led by the attending physician.  Recommendations may be updated based on patient status, additional functional criteria and insurance authorization.  ? ?Follow Up Recommendations ? Skilled nursing-short term rehab (<3 hours/day)  ?  ?Assistance Recommended at Discharge Frequent or constant Supervision/Assistance  ?Patient can return home with the following Two people to help with walking and/or transfers;A lot of help with bathing/dressing/bathroom;Direct supervision/assist for medications management;Direct supervision/assist for financial management;Assistance with feeding;Assistance with cooking/housework;Assist for transportation;Help with stairs or ramp for entrance ? ?  ?Functional Status Assessment ? Patient has had a recent decline in their functional  status and/or demonstrates limited ability to make significant improvements in function in a reasonable and predictable amount of time  ?Equipment Recommendations ? None recommended by OT  ?  ?Recommendations for Other Services   ? ? ?  ?Precautions / Restrictions Precautions ?Precautions: Fall ?Precaution Comments: R hemiplegia from prior CVAs ?Restrictions ?Weight Bearing Restrictions: No  ? ?  ? ?Mobility Bed Mobility ?Overal bed mobility: Needs Assistance ?Bed Mobility: Supine to Sit, Sit to Supine ?  ?  ?Supine to sit: Max assist, HOB elevated ?Sit to supine: Max assist ?  ?General bed mobility comments: pt initiates moving toward EOB, but needs significant assistance for all aspects ?  ? ?Transfers ?Overall transfer level: Needs assistance ?Equipment used: 1 person hand held assist ?Transfers: Sit to/from Stand ?Sit to Stand: Max assist, From elevated surface ?  ?  ?  ?  ?  ?  ?  ? ?  ?Balance Overall balance assessment: Needs assistance ?Sitting-balance support: Single extremity supported, Feet supported ?Sitting balance-Leahy Scale: Poor ?Sitting balance - Comments: UE support and minA to sit EOB ?  ?Standing balance support: Single extremity supported ?Standing balance-Leahy Scale: Zero ?Standing balance comment: stabilizing LEs on bed, assist to rise and steady ?  ?  ?  ?  ?  ?  ?  ?  ?  ?  ?  ?   ? ?ADL either performed or assessed with clinical judgement  ? ?ADL Overall ADL's : Needs assistance/impaired ?Eating/Feeding: Minimal assistance;Sitting;Bed level ?  ?Grooming: Wash/dry hands;Wash/dry face;Bed level;Set up ?  ?  ?  ?  ?  ?  ?  ?  ?  ?  ?  ?  ?  ?  ?  ?  ?General ADL Comments: total assist for bathing, dressing and toileting  ? ? ? ?Vision  Patient Visual Report: Blurring of vision ?   ?   ?Perception   ?  ?Praxis   ?  ? ?Pertinent Vitals/Pain    ? ? ? ?Hand Dominance Right ?  ?Extremity/Trunk Assessment Upper Extremity Assessment ?Upper Extremity Assessment: RUE deficits/detail ?RUE Deficits /  Details: hypertonicity in flexion pattern distally, has a splint, painful shoulder with ROM, extensor tone in elbow ?RUE: Shoulder pain with ROM ?RUE Coordination: decreased fine motor;decreased gross motor (no functional use) ?  ?Lower Extremity Assessment ?Lower Extremity Assessment: Defer to PT evaluation ?  ?Cervical / Trunk Assessment ?Cervical / Trunk Assessment: Kyphotic;Other exceptions (weakness) ?  ?Communication Communication ?Communication: No difficulties ?  ?Cognition Arousal/Alertness: Awake/alert ?Behavior During Therapy: Flat affect ?  ?Area of Impairment: Orientation, Memory, Following commands, Safety/judgement, Awareness, Problem solving ?  ?  ?  ?  ?  ?  ?  ?  ?Orientation Level: Disoriented to, Situation, Time ?  ?Memory: Decreased short-term memory ?Following Commands: Follows one step commands with increased time ?Safety/Judgement: Decreased awareness of safety, Decreased awareness of deficits ?Awareness: Intellectual ?Problem Solving: Slow processing, Decreased initiation, Difficulty sequencing, Requires tactile cues, Requires verbal cues ?General Comments: No family available to determine baseline. ?  ?  ?General Comments    ? ?  ?Exercises   ?  ?Shoulder Instructions    ? ? ?Home Living Family/patient expects to be discharged to:: Private residence ?Living Arrangements: Spouse/significant other ?Available Help at Discharge: Family;Available PRN/intermittently (son and husband work) ?Type of Home: House ?Home Access: Level entry ?  ?  ?Home Layout: One level ?  ?  ?Bathroom Shower/Tub: Tub/shower unit ?  ?Bathroom Toilet: Standard ?Bathroom Accessibility: No ?  ?Home Equipment: Conservation officer, nature (2 wheels);BSC/3in1;Wheelchair - manual;Shower seat;Tub bench;Grab bars - tub/shower;Grab bars - toilet;Hospital bed;Other (comment) (hoyer lift) ?  ?  ?  ? ?  ?Prior Functioning/Environment Prior Level of Function : Needs assist ?  ?  ?  ?  ?  ?  ?Mobility Comments: Uses the RW for very short gait  bouts with assistance occasionally but otherwise is in the wheelchair. Needs assistance for transfers. Can usually do bed mobility on her owns she states. ?ADLs Comments: Needs assistance for all ADLs, wears depends. ?  ? ?  ?  ?OT Problem List:   ?  ?   ?OT Treatment/Interventions: Neuromuscular education;Therapeutic activities;Patient/family education;Balance training;Self-care/ADL training  ?  ?OT Goals(Current goals can be found in the care plan section) ADL Goals ?Pt Will Transfer to Toilet: with mod assist;stand pivot transfer ?Pt/caregiver will Perform Home Exercise Program: Right Upper extremity;With minimal assist (AAROM) ?Additional ADL Goal #1: Pt will perform bed mobility with moderate assistance for pericare and repositioning. ?Additional ADL Goal #2: Pt will demonstrate fair sitting balance at EOB as a precursor to ADLs.  ?OT Frequency: Min 2X/week ?  ? ?Co-evaluation   ?  ?  ?  ?  ? ?  ?AM-PAC OT "6 Clicks" Daily Activity     ?Outcome Measure Help from another person eating meals?: A Little ?Help from another person taking care of personal grooming?: A Little ?Help from another person toileting, which includes using toliet, bedpan, or urinal?: Total ?Help from another person bathing (including washing, rinsing, drying)?: Total ?Help from another person to put on and taking off regular upper body clothing?: Total ?Help from another person to put on and taking off regular lower body clothing?: Total ?6 Click Score: 10 ?  ?End of Session   ? ?Activity Tolerance: Patient tolerated  treatment well ?Patient left: in bed;with call bell/phone within reach;with bed alarm set ? ?OT Visit Diagnosis: Pain;Hemiplegia and hemiparesis;Other symptoms and signs involving cognitive function;Muscle weakness (generalized) (M62.81);Unsteadiness on feet (R26.81) ?Hemiplegia - Right/Left: Right ?Hemiplegia - dominant/non-dominant: Dominant ?Hemiplegia - caused by: Cerebral infarction  ?              ?Time: [id number] ?OT Time  Calculation (min): 17 min ?Charges:  OT General Charges ?$OT Visit: 1 Visit ?OT Evaluation ?$OT Eval Moderate Complexity: 1 Mod ? ?Nestor Lewandowsky, OTR/L ?Acute Rehabilitation Services ?Pager: [phone]

## 2022-01-08 NOTE — TOC Progression Note (Signed)
Transition of Care (TOC) - Progression Note  ? ? ?Patient Details  ?Name: Jade [name] ?MRN: [id number] ?Date of Birth: October 22, 1955 ? ?Transition of Care (TOC) CM/SW Contact  ?Coralee Pesa, LCSWA ?Phone Number: ?01/08/2022, 2:32 PM ? ?Clinical Narrative:    ?CSW met with pt at bedside to discuss PT rec for SNF. Pt is agreeable to SNF with no preference, requests the [place] area. Pt is from home with spouse and son. She states spouse only speaks [place], but CSW can speak with Son, Corene Cornea. CSW spoke with Corene Cornea who is also agreeable with the [place] area. Plan is for pt to return home after rehab. Pt states she has never been to SNF before. CSW will fax pt out for bed offers. TOC will continue to follow for DC needs. ? ? ?Expected Discharge Plan: [place] ?Barriers to Discharge: Ship broker, SNF Pending bed offer, Continued Medical Work up ? ?Expected Discharge Plan and Services ?Expected Discharge Plan: [place] ?  ?Discharge Planning Services: CM Consult ?Post Acute Care Choice: [place] ?Living arrangements for the past 2 months: [place] ?                ?  ?  ?  ?  ?  ?  ?  ?  ?  ?  ? ? ?Social Determinants of Health (SDOH) Interventions ?  ? ?Readmission Risk Interventions ? ?  06/30/2021  ?  1:48 PM  ?Readmission Risk Prevention Plan  ?Transportation Screening Complete  ?PCP or Specialist Appt within 3-5 Days Complete  ?[place] or Home Care Consult Complete  ?Social Work Consult for [place] Planning/Counseling Complete  ?Palliative Care Screening Not Applicable  ? ? ?

## 2022-01-08 NOTE — Hospital Course (Addendum)
Weakness, multifactorial History of CVA with residual right-sided weakness Dehydration secondary to viral gastroenteritis Baseline with significant right-sided weakness secondary to multiple CVAs most recently January 2023 presenting with gradually worsening weakness in the past several weeks and viral gastroenteritis. CTA head and neck did not reveal any acute changes but did reveal new chronic infarcts in the left PCA and posterior right MCA distribution. SNF placement was recommended. Patient's gastroenteritis was resolved at discharge.  UTI >100,000 K pneumoniae on Ucx with dysuria and frequency.Treated with CTX (5/14-5/15). This was discontinued after culture only sensitive to PIP Tazo and imipenem with intermediate sensitivity to nitrofurantoin on culture and patient was asymptomatic at this time. Remained stable without recurrence of dysuria or fever.  Goals of Care Significant physical disability from most recent CVA in January 2023, patient has significant frustration as she requires significant assistance with ADLs. Palliative care was consulted and HCPOA named [name] (son) and patient DNR with outpatient palliative follow up.

## 2022-01-08 NOTE — Progress Notes (Signed)
FPTS Brief Progress Note [name]: sleeping soundly ? ? ?O: ?BP (!) 152/66 (BP Location: Left Arm)   Pulse 70   Temp 98.3 ?F (36.8 ?C) (Oral)   Resp 14   SpO2 97%   ? ? ?A/P: ?- Orders reviewed. Labs for AM ordered, which was adjusted as needed.  ?- See daily progress note for plan details. ? ?Gladys Damme, MD ?01/08/2022, 1:12 AM ?PGY-3, [place] [place] [facility] Night Resident  ?Please page [phone] with questions.  ? ? ?

## 2022-01-09 LAB — URINE CULTURE: Culture: >=100000 — AB

## 2022-01-09 LAB — BASIC METABOLIC PANEL
Anion gap: 8 (ref 5–15)
BUN: 13 mg/dL (ref 8–23)
CO2: 19 mmol/L — ABNORMAL LOW (ref 22–32)
Calcium: 9.2 mg/dL (ref 8.9–10.3)
Chloride: 110 mmol/L (ref 98–111)
Creatinine, Ser: 0.92 mg/dL (ref 0.44–1.00)
GFR, Estimated: >60 mL/min (ref >60)
Glucose, Bld: 121 mg/dL — ABNORMAL HIGH (ref 70–99)
Potassium: 3.6 mmol/L (ref 3.5–5.1)
Sodium: 137 mmol/L (ref 135–145)

## 2022-01-09 LAB — CBC
HCT: 37.4 % (ref 36.0–46.0)
Hemoglobin: 12.2 g/dL (ref 12.0–15.0)
MCH: 28.6 pg (ref 26.0–34.0)
MCHC: 32.6 g/dL (ref 30.0–36.0)
MCV: 87.8 fL (ref 80.0–100.0)
Platelets: 358 10*3/uL (ref 150–400)
RBC: 4.26 MIL/uL (ref 3.87–5.11)
RDW: 18.1 % — ABNORMAL HIGH (ref 11.5–15.5)
WBC: 10.4 10*3/uL (ref 4.0–10.5)
nRBC: 0 % (ref 0.0–0.2)

## 2022-01-09 MED ORDER — ORAL CARE MOUTH RINSE
15.0000 mL | Freq: Two times a day (BID) | OROMUCOSAL | Status: DC
  Administered 2022-01-09 – 2022-01-11 (×3): 15 mL via OROMUCOSAL

## 2022-01-09 MED ORDER — POLYETHYLENE GLYCOL 3350 17 G PO PACK
17.0000 g | PACK | Freq: Every day | ORAL | Status: DC
  Administered 2022-01-09 – 2022-01-11 (×3): 17 g via ORAL
  Filled 2022-01-09 (×3): qty 1

## 2022-01-09 NOTE — Progress Notes (Signed)
[facility] ([facility]) Hospital Liaison Note  Notified by TOC manager of patient/family request for [facility] palliative services at SNF after discharge.   [facility] hospital liaison will follow patient for discharge disposition.   Please call with any hospice or outpatient palliative care related questions.   Thank you for the opportunity to participate in this patient's care.   Shanita Wicker, LCSW [facility] Hospital Liaison [phone]  

## 2022-01-09 NOTE — Progress Notes (Signed)
This chaplain responded to PMT consult for creating the Pt. HCPOA and naming the Pt. son-[name] as her healthcare agent. The Pt. is awake and able to briefly describe the role of an healthcare agent.  The Pt. decided to revisit HCPOA education at the next chaplain visit. ? ?The chaplain listened reflectively as the Pt. shared the hope of her husband and son removing the barriers of language and culture and recognizing the love she has for her family. The Pt. emotionally verbalizes the first step as husband and son visiting the hospital together. The Pt. shares her physical pain is managed today with the medication and her existential pain may be more of journey with periods of discernment and discovery. The chaplain defers the Pt. question about when she will die to the medical provider.   ? ?The Pt. accepted the chaplain's invitation for prayer and F/U spiritual care. ? ?Chaplain Sallyanne Kuster ?[phone] ? ? ?

## 2022-01-09 NOTE — Progress Notes (Signed)
FPTS Brief Progress Note [name] Sleeping ? ? ?O: ?BP (!) 116/92 (BP Location: Right Arm)   Pulse 75   Temp 99.6 ?F (37.6 ?C) (Oral)   Resp 16   SpO2 98%   ? ?A/P: ?Plan per day team  ?- Orders reviewed. Labs for AM ordered, which was adjusted as needed.  ? ?Darral Dash, DO ?01/09/2022, 3:40 AM ?PGY-3, [facility] Night Resident  ?Please page [phone] with questions.  ? ?

## 2022-01-09 NOTE — TOC Progression Note (Signed)
Transition of Care (TOC) - Progression Note  ? ? ?Patient Details  ?Name: Jade [name] ?MRN: [id number] ?Date of Birth: 12-01-1955 ? ?Transition of Care (TOC) CM/SW Contact  ?Benard Halsted, LCSW ?Phone Number: ?01/09/2022, 5:42 PM ? ?Clinical Narrative:    ?CSW met with patient's ex-husband and patient at bedside using Spanish Interpretor. CSW explained SNF process and Jose asked about Medicaid application and [place] for when she needs to return to his house. CSW printed application and showed him the number to call for help in Spanish and the address of Social Services. CSW explained that [facility] Palliative will follow patient at SNF and then if patient returns home she can see if she qualifies for home hospice. They are both requesting [facility] for rehab as it is close to [place] home. CSW will updated insurance. Both express being extremely grateful for any help.  ? ? ?Expected Discharge Plan: [place] ?Barriers to Discharge: Ship broker, SNF Pending bed offer, Continued Medical Work up ? ?Expected Discharge Plan and Services ?Expected Discharge Plan: [place] ?  ?Discharge Planning Services: CM Consult ?Post Acute Care Choice: [place] ?Living arrangements for the past 2 months: [place] ?                ?  ?  ?  ?  ?  ?  ?  ?  ?  ?  ? ? ?Social Determinants of Health (SDOH) Interventions ?  ? ?Readmission Risk Interventions ? ?  06/30/2021  ?  1:48 PM  ?Readmission Risk Prevention Plan  ?Transportation Screening Complete  ?PCP or Specialist Appt within 3-5 Days Complete  ?[place] or Home Care Consult Complete  ?Social Work Consult for [place] Planning/Counseling Complete  ?Palliative Care Screening Not Applicable  ? ? ?

## 2022-01-09 NOTE — Progress Notes (Signed)
FPTS Brief Progress Note [name]:Patient sleeping comfortably  ? ? ?O: ?BP (!) 150/69 (BP Location: Right Wrist)   Pulse 85   Temp 98.5 ?F (36.9 ?C) (Oral)   Resp 16   SpO2 99%   ?General: sleeping, NAD ?CV: RRR ?Resp: breathing comfortably on RA ? ? ?A/P: ? ?Klebsiella pneumoniae UTI ?Given patient appeared to be asymptomatic, was not started on abx. Will monitor vitals and if fevers overnight will consider starting abx ( sensitive to PIP Tazo and imipenem with intermediate sensitivity to nitrofurantoin)  ?- Orders reviewed. Labs for AM ordered, which was adjusted as needed.  ?- see full plan per day team's note  ? ?Cora Collum, DO ?01/09/2022, 11:50 PM ?PGY-2, [place] [facility] Night Resident  ?Please page [phone] with questions.  ?  ?

## 2022-01-09 NOTE — Plan of Care (Signed)
?  Problem: Education: ?Goal: Knowledge of General Education information will improve ?Description: Including pain rating scale, medication(s)/side effects and non-pharmacologic comfort measures ?Outcome: Progressing ?  ?Problem: Coping: ?Goal: Level of anxiety will decrease ?Outcome: Progressing ?  ?Problem: Pain Managment: ?Goal: General experience of comfort will improve ?Outcome: Progressing ?  ?Problem: Safety: ?Goal: Ability to remain free from injury will improve ?Outcome: Progressing ?  ?Problem: Coping: ?Goal: Will verbalize positive feelings about self ?Outcome: Progressing ?  ?

## 2022-01-09 NOTE — TOC Progression Note (Signed)
Transition of Care (TOC) - Progression Note  ? ? ?Patient Details  ?Name: Jade [name] ?MRN: [id number] ?Date of Birth: 03-Mar-1956 ? ?Transition of Care (TOC) CM/SW Contact  ?Mearl Latin, LCSW ?Phone Number: ?01/09/2022, 2:11 PM ? ?Clinical Narrative:    ?CSW presented SNF offers to patient and offered to contact her son to help choose a facility but she stated she is comfortable choosing by herself. She did not remember going to [facility] earlier this year. CSW will check back for choice.  ? ? ?Expected Discharge Plan: Skilled Nursing Facility ?Barriers to Discharge: English as a second language teacher, SNF Pending bed offer, Continued Medical Work up ? ?Expected Discharge Plan and Services ?Expected Discharge Plan: Skilled Nursing Facility ?  ?Discharge Planning Services: CM Consult ?Post Acute Care Choice: Skilled Nursing Facility ?Living arrangements for the past 2 months: Single Family Home ?                ?  ?  ?  ?  ?  ?  ?  ?  ?  ?  ? ? ?Social Determinants of Health (SDOH) Interventions ?  ? ?Readmission Risk Interventions ? ?  06/30/2021  ?  1:48 PM  ?Readmission Risk Prevention Plan  ?Transportation Screening Complete  ?PCP or Specialist Appt within 3-5 Days Complete  ?HRI or Home Care Consult Complete  ?Social Work Consult for Recovery Care Planning/Counseling Complete  ?Palliative Care Screening Not Applicable  ? ? ?

## 2022-01-09 NOTE — Progress Notes (Signed)
Family Medicine Teaching Service ?Daily Progress Note ?Intern Pager: [phone] ? ?Patient name: Jade [name] Medical record number: [id number] ?Date of birth: Sep 06, 1955 Age: 66 y.o. Gender: female ? ?Primary Care Provider: Sue Lush, PA-C ?Consultants: Palliative care ?Code Status: Full ? ?Pt Overview and Major Events to Date:  ?5/13-admitted, started on ceftriaxone for UTI ?5/16-urine culture grew Klebsiella pneumonia greater than 100,000 colonies ? ?Assessment and Plan: ? ?[name] is a 66 yo female presenting with generalized weakness in the setting of likely viral gastroenteritis, UTI and residual weakness from prior CVA. 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 ?Weakness, multifactorial  Hx of CVA with residual R sided weakness  Dehydration 2/2 viral gastroenteritis (resolved) ?Denies any acute issues overnight.  Continues to have right-sided weakness.  Palliative met with patient yesterday and continues to be DNR and Chauncey Reading is son Corene Cornea with goal for SNF and palliative care follow-up.  Patient says she has not had a bowel movement and feels uncomfortable.  ?-ASA 81 mg daily ?-Zofran ODT 4 mg every 8 hours as needed ?-PT following-Messaged about need for likely long-term SNF ?-Miralax 17 g daily ? ?Klebsiella pneumoniae UTI ?Greater than 100,000 colonies.  Only sensitive to PIP Tazo and imipenem with intermediate sensitivity to nitrofurantoin on culture.  Patient did have dysuria yesterday/abdominal pain but none today ?-D/c ctx, monitor sxs ? ?HTN ?Blood pressures have been 116 and 165/66-104. ?-Continue home amlodipine 10 mg daily ? ?[place] ?Palliative saw patient yesterday and discussed goals of care with the patient.  See note from 5/15 ?-DNR ?-Plan for outpatient palliative care ? ?Chronic/stable ?HLD-atorvastatin 80 mg ?Diet-controlled T2DM-glucose stable on BMP ?Vitamin D  deficiency-vitamin D supplement ?Back pain-tizanidine as needed ?Malnutrition-Ensure twice daily ?Duodenal ulcer and-Protonix 40 mg ? ?FEN/GI: Heart healthy ?PPx: Lovenox ?Dispo: SNF, medically stable ? ?Subjective:  ?Says that she has not had a bowel movement and feels a little off today denies any pain ? ?Objective: ?Temp:  [98.3 ?F (36.8 ?C)-99.6 ?F (37.6 ?C)] 98.4 ?F (36.9 ?C) (05/16 0800) ?Pulse Rate:  [75-99] 84 (05/16 0800) ?Resp:  [16-20] 16 (05/16 0800) ?BP: (116-165)/(66-104) 159/70 (05/16 0800) ?SpO2:  [92 %-99 %] 99 % (05/16 0800) ?Physical Exam: ?General: NAD, laying in bed comfortably, alert x oriented to person and place (thinks it is 2006, not sure of situation) and responsive to all questions ?Cardiovascular: RRR no murmurs rubs or gallops ?Respiratory: Clear to auscultation bilaterally no wheezes rales or crackles ?Abdomen: Soft, nondistended, nontender to palpation, mildly hyperactive bowel sounds ?Extremities: No lower extremity edema ? ?Laboratory: ?Recent Labs  ?Lab 01/06/22 ?1715 01/06/22 ?1819 01/07/22 ?0411 01/09/22 ?0115  ?WBC 11.1*  --  9.3 10.4  ?HGB 13.2 13.9 12.7 12.2  ?HCT 40.2 41.0 39.1 37.4  ?PLT 372  --  348 358  ? ?Recent Labs  ?Lab 01/06/22 ?1715 01/06/22 ?1819 01/07/22 ?0411 01/08/22 ?9417 01/09/22 ?0115  ?NA 137   < > 138 139 137  ?K 3.9   < > 3.2* 3.2* 3.6  ?CL 105   < > 108 107 110  ?CO2 22  --  20* 23 19*  ?BUN 14   < > $R'9 9 13  'OG$ ?CREATININE 1.00   < > 0.78 0.80 0.92  ?CALCIUM 9.6  --  9.3 9.5 9.2  ?PROT 6.9  --   --   --   --   ?BILITOT 0.8  --   --   --   --   ?  ALKPHOS 66  --   --   --   --   ?ALT 16  --   --   --   --   ?AST 27  --   --   --   --   ?GLUCOSE 202*   < > 118* 115* 121*  ? < > = values in this interval not displayed.  ? ? ?Imaging/Diagnostic Tests: ?No results found.  ? ?Gerrit Heck, MD ?01/09/2022, 8:59 AM ?PGY-1, [place] ?[place] Intern pager: [phone], text pages welcome  ?

## 2022-01-09 NOTE — TOC Progression Note (Signed)
Transition of Care (TOC) - Progression Note  ? ? ?Patient Details  ?Name: Jade [name] ?MRN: [id number] ?Date of Birth: Jun 01, 1956 ? ?Transition of Care (TOC) CM/SW Contact  ?Lawerance Sabal, RN ?Phone Number: ?01/09/2022, 11:59 AM ? ?Clinical Narrative:   Spoke with patient at bedside to discuss consult for outpatient palliative services. She is agreeable and would like [facility]. ?Message sent to [facility] at [facility] ? ? ? ?Expected Discharge Plan: Skilled Nursing Facility ?Barriers to Discharge: English as a second language teacher, SNF Pending bed offer, Continued Medical Work up ? ?Expected Discharge Plan and Services ?Expected Discharge Plan: Skilled Nursing Facility ?  ?Discharge Planning Services: CM Consult ?Post Acute Care Choice: Skilled Nursing Facility ?Living arrangements for the past 2 months: Single Family Home ?                ?  ?  ?  ?  ?  ?  ?  ?  ?  ?  ? ? ?Social Determinants of Health (SDOH) Interventions ?  ? ?Readmission Risk Interventions ? ?  06/30/2021  ?  1:48 PM  ?Readmission Risk Prevention Plan  ?Transportation Screening Complete  ?PCP or Specialist Appt within 3-5 Days Complete  ?HRI or Home Care Consult Complete  ?Social Work Consult for Recovery Care Planning/Counseling Complete  ?Palliative Care Screening Not Applicable  ? ? ?

## 2022-01-09 NOTE — Progress Notes (Signed)
? ?                                                                                                                                                     ?                                                   ?Daily Progress Note  ? ?Patient Name: Jade [name]       Date: 01/09/2022 ?DOB: 04-Jan-1956  Age: 66 y.o. MRN#: [id number] ?Attending Physician: Lind Covert, MD ?Primary Care Physician: Sue Lush, PA-C ?Admit Date: 01/06/2022 ? ?Reason for Consultation/Follow-up: Establishing goals of care ? ?Subjective: ?Medical records reviewed including progress notes, labs, imaging. Patient assessed at the bedside and is in good spirits, tells me her abdominal pain is getting better today. Her ex-husband Jacqulyn Bath is present assisting with dinner. ? ?Informed Mariaisabel that I left a voicemail for her son Corene Cornea today in hopes of arranging a family meeting. Coordinated with [facility] for his availability, as Caiden's goal is for both [place] and Corene Cornea to be present to discuss her current illness and possible outcomes moving forward. Jerrell Mylar then visited at the bedside and were able to have a good discussion about Teonia's care preferences, her thoughts and feelings on her current illness, and her desire for family to work together to support her. Jacqulyn Bath is agreeable to this PA assisting with Spanish translation given impromptu nature of the family meeting. ? ?We reviewed patient's MOST form, care preferences for limited medical interventions, and the uncertainty of benefit from SNF versus potential for suffering along this recovery path. Patient's family understands that she is willing to try her best, but she would not want her quality of life to be any worse and may be ready to consider hospice support if this occurs. Corene Cornea shares that he understands her thoughts on this, as they have been through this a couple times before. We discussed responsibilities of HCPOA and he is agreeable to being designated as her Runner, broadcasting/film/video. Discussed the importance of honoring patient's wishes above individual opinions and beliefs. Jacqulyn Bath is very hopeful for recovery and faithful, while also acknowledging it is also loving to give patient space to transition into different phases of life when she is ready. ? ?Questions and concerns addressed. PMT will continue to support holistically. ? ?Length of Stay: 3 ? ?Current Medications: ?Scheduled Meds:  ? amLODipine  10 mg Oral Daily  ? aspirin EC  81 mg Oral Daily  ? atorvastatin  80 mg Oral QPM  ? cholecalciferol  1,000 Units Oral Daily  ? enoxaparin (LOVENOX) injection  40 mg Subcutaneous Q24H  ? feeding supplement  237 mL Oral BID BM  ? lidocaine  1 patch Transdermal Daily  ? mouth rinse  15 mL Mouth Rinse BID  ? multivitamin with minerals   Oral Daily  ? pantoprazole  40 mg Oral Q1400  ? polyethylene glycol  17 g Oral Daily  ? ? ?PRN Meds: ?acetaminophen **OR** acetaminophen, ondansetron, tiZANidine ? ?Physical Exam ?Vitals and nursing note reviewed.  ?Constitutional:   ?   General: She is not in acute distress. ?   Appearance: She is ill-appearing.  ?Cardiovascular:  ?   Rate and Rhythm: Normal rate.  ?Pulmonary:  ?   Effort: Pulmonary effort is normal. No respiratory distress.  ?Neurological:  ?   Mental Status: She is alert and oriented to person, place, and time.  ?Psychiatric:     ?   Mood and Affect: Affect is tearful.  ?         ? ?Vital Signs: BP (!) 146/74 (BP Location: Right Wrist)   Pulse 75   Temp 98.7 ?F (37.1 ?C) (Oral)   Resp 16   SpO2 99%  ?SpO2: SpO2: 99 % ?O2 Device: O2 Device: Room Air ?O2 Flow Rate:   ? ?Intake/output summary:  ?Intake/Output Summary (Last 24 hours) at 01/09/2022 1655 ?Last data filed at 01/09/2022 0400 ?Gross per 24 hour  ?Intake 215.39 ml  ?Output 400 ml  ?Net -184.61 ml  ? ? ?LBM: Last BM Date : 01/08/22 ?Baseline Weight:   ?Most recent weight:   ? ?     ?Palliative Assessment/Data: 40% ? ? ? ? ? ?Patient Active Problem List  ? Diagnosis Date Noted   ? Urinary tract infection without hematuria   ? Goals of care, counseling/discussion   ? Weakness following cerebrovascular accident (CVA) 01/06/2022  ? Duodenal ulcer   ? Acute arterial ischemic stroke, multifocal, posterior circulation, unspecified laterality ([place]) 09/16/2021  ? Anemia 09/16/2021  ? COVID-19 06/25/2021  ? Cerebral thrombosis with cerebral infarction 06/19/2021  ? Weakness due to old stroke 06/17/2021  ? Labile blood glucose   ? Essential hypertension   ? Spastic hemiparesis ([place])   ? Stage 3a chronic kidney disease ([place])   ? Controlled type 2 diabetes mellitus with hyperglycemia, without long-term current use of insulin ([place])   ? CVA (cerebral vascular accident) ([place]) 04/13/2021  ? TIA (transient ischemic attack) 04/02/2021  ? Carotid artery stenosis with cerebral infarction ([place]) 04/02/2021  ? COPD (chronic obstructive pulmonary disease) ([place]) 04/02/2021  ? Stroke-like symptoms 03/05/2021  ? Acute CVA (cerebrovascular accident) ([place]) 03/21/2018  ? Obesity, Class III, BMI 40-49.9 (morbid obesity) ([place]) 03/21/2018  ? IBS (irritable bowel syndrome) 04/12/2015  ? Hair loss 10/20/2014  ? Primary osteoarthritis of left knee 09/29/2014  ? Visit for screening mammogram 08/30/2014  ? Routine general medical examination at a health care facility 08/30/2014  ? Tinea corporis 12/21/2013  ? Essential hypertension, benign 12/21/2013  ? Low back pain 08/24/2013  ? Patient noncompliant with statin medication 02/23/2013  ? H/O abnormal Pap smear 10/24/2012  ? Osteopenia 09/26/2012  ? Diabetic neuropathy, painful ([place]) 05/26/2012  ? Diabetes mellitus type 2 with neurological manifestations ([place]) 03/20/2012  ? Pure hypercholesterolemia 03/20/2012  ? Chronic venous insufficiency 03/20/2012  ? ? ?Palliative Care Assessment & Plan  ? ?Patient Profile: ?YARISSA PROVENCE is a 66 yo female presenting with generalized weakness in the setting of likely viral gastroenteritis, UTI and residual weakness from prior CVA. PMH  significant for multiple CVAs with residual weakness greater on R side, L  carotid stenosis s/p stent, HTN, diet controlled T2DM, HLD, former smoker, COPD ?  ?PMT has been consulted to assist with goals of care conversation. ? ?Assessment: ?Goals of care conversation ?Viral gastroenteritis ?UTI ?Hx of CVA with residual R sided weakness  ? ?Recommendations/Plan: ?DNR ?Continue current care  ?Goal is for SNF with palliative care follow up, would be ready for hospice if she continues to decline or does not improve ?Appreciate TOC assistance with referral to outpatient palliative care ?PMT remains available for acute needs ? ? ? ?Prognosis: ? Guarded ? ?Discharge Planning: ?[place] for rehab with Palliative care service follow-up ? ?Care plan was discussed with Patient, patient's ex-husband Dean, son Doristine Devoid  ? ? ?Total time: ?I spent 50 minutes in the care of the patient today in the above activities and documenting the encounter. ? ? ?Dorthy Cooler, PA-C ?Palliative Medicine Team ?Team phone # [phone] ? ?Thank you for allowing the Palliative Medicine Team to assist in the care of this patient. Please utilize secure chat with additional questions, if there is no response within 30 minutes please call the above phone number. ? ?Palliative Medicine Team providers are available by phone from 7am to 7pm daily and can be reached through the team cell phone.  ?Should this patient require assistance outside of these hours, please call the patient's attending physician.  ? ? ? ?

## 2022-01-09 NOTE — Care Management Important Message (Signed)
Important Message ? ?Patient Details  ?Name: Jade [name] ?MRN: [id number] ?Date of Birth: Apr 11, 1956 ? ? ?Medicare Important Message Given:  Yes ? ? ? ? ?Shanora Christensen ?01/09/2022, 2:50 PM ?

## 2022-01-09 NOTE — Progress Notes (Signed)
Family Medicine Teaching Service ?Daily Progress Note ?Intern Pager: [phone] ? ?Patient name: Jade [name] Medical record number: [id number] ?Date of birth: 1956/04/15 Age: 66 y.o. Gender: female ? ?Primary Care Provider: Nathaneil Canary, PA-C ?Consultants: palliative care ?Code Status: DNR ? ?Pt Overview and Major Events to Date:  ?5/13 admitted, started on CTX for UTI ?5/16 Ucx >100,000 colonies K pneumoniae-not symptomatic-Abx d/c'd ? ?Assessment and Plan: ? ?Jade [name] is a 66 yo female presenting with generalized weakness in the setting of likely viral gastroenteritis, UTI and residual weakness from prior CVA. 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 ?Weakness, multifactorial  Hx of CVA with residual R sided weakness  Dehydration 2/2 viral gastroenteritis (resolved) ?Denies any acute issues. At baseline with right sided weakness.  ?-ASA 81 mg daily ?-zofran ODT 4 mg q8h prn ?-PT following ?-Miralax ? ?K pneumoniae UTI (asymptomatic) ?D/c'd CTX yesterday. Has suprapubic tenderness but no dysuria ?-monitor sxs ? ?HTN ?BP ranges of 139-153/69-81 ?-continue home amlodipine 10 mg daily ? ?[place] ?See palliative care note from 5/15 ?-Palliative to follow outpatient ?-DNR ? ?Chronic/stable ?HLD- Atorvastatin 80 ?Diet controlled T2DM-glucose stable on BMP  ?Vit D def-supplement ?Back pain-tizanidine prn ?Malbutrition-ensure BID ?Duodenal ulcer-protonix 40 mg ? ?FEN/GI: Heart healthy ?PPx: lovenox ?Dispo:SNF medically stable pending auth ? ?Subjective:  ?Complains of some body aches/shoulder pain, overall feeling okay ? ?Objective: ?Temp:  [98.1 ?F (36.7 ?C)-99.6 ?F (37.6 ?C)] 98.5 ?F (36.9 ?C) (05/16 2015) ?Pulse Rate:  [63-85] 85 (05/16 2015) ?Resp:  [16] 16 (05/16 2015) ?BP: (116-160)/(69-92) 150/69 (05/16 2015) ?SpO2:  [98 %-99 %] 99 % (05/16 2015) ?Physical Exam: ?General: NAD, laying in  bed  ?Cardiovascular: RRR no m/r/g ?Respiratory: CTAB no w/r/c ?Abdomen: Soft, nondistended, suprapubic tenderness ?Extremities: No LE edema ? ?Laboratory: ?Recent Labs  ?Lab 01/06/22 ?1715 01/06/22 ?1819 01/07/22 ?0411 01/09/22 ?0115  ?WBC 11.1*  --  9.3 10.4  ?HGB 13.2 13.9 12.7 12.2  ?HCT 40.2 41.0 39.1 37.4  ?PLT 372  --  348 358  ? ?Recent Labs  ?Lab 01/06/22 ?1715 01/06/22 ?1819 01/07/22 ?0411 01/08/22 ?4008 01/09/22 ?0115  ?NA 137   < > 138 139 137  ?K 3.9   < > 3.2* 3.2* 3.6  ?CL 105   < > 108 107 110  ?CO2 22  --  20* 23 19*  ?BUN 14   < > 9 9 13   ?CREATININE 1.00   < > 0.78 0.80 0.92  ?CALCIUM 9.6  --  9.3 9.5 9.2  ?PROT 6.9  --   --   --   --   ?BILITOT 0.8  --   --   --   --   ?ALKPHOS 66  --   --   --   --   ?ALT 16  --   --   --   --   ?AST 27  --   --   --   --   ?GLUCOSE 202*   < > 118* 115* 121*  ? < > = values in this interval not displayed.  ? ? ?Imaging/Diagnostic Tests: ?No results found.  ? ? , MD ?01/09/2022, 11:02 PM ?PGY-1, [facility] ?[facility] Intern pager: [phone], text pages welcome  ?

## 2022-01-09 NOTE — Progress Notes (Signed)
Physical Therapy Treatment ?Patient Details ?Name: Jade [name] ?MRN: [id number] ?DOB: 11/23/1955 ?Today's Date: 01/09/2022 ? ? ?History of Present Illness 66 y.o. female presenting 01/06/22 with generalized weakness in the setting of likely viral gastroenteritis, UTI, and residual weakness from prior CVA. CT head left PCA and posterior right MCA distribution infarcts, new from prior. Additional chronic left frontal infarct is stable. PMH: arthritis, CKD, COPD, CVAs with R hemiparesis, fibromyalgia, gout, HTN, migraine, OSA, DM2 ? ?  ?PT Comments  ? ? Patient motivated to participate however very limited by Rt sided hypertonicity/flexor tone with tightness and pain with movement. Able to sit at EOB x 8 minutes working on sitting balance, orientation to midline (pt's sense of midline is shifted to her right), and trunk rotation/stretching. Attempted standing with pt unable to stand with +1 assist with reports of pain all over with attempts. ?could benefit from use of Stedy lift.  ?  ?Recommendations for follow up therapy are one component of a multi-disciplinary discharge planning process, led by the attending physician.  Recommendations may be updated based on patient status, additional functional criteria and insurance authorization. ? ?Follow Up Recommendations ? Skilled nursing-short term rehab (<3 hours/day) ?  ?  ?Assistance Recommended at Discharge Frequent or constant Supervision/Assistance  ?Patient can return home with the following Two people to help with walking and/or transfers;Two people to help with bathing/dressing/bathroom;Assistance with cooking/housework;Direct supervision/assist for medications management;Direct supervision/assist for financial management;Assist for transportation;Help with stairs or ramp for entrance ?  ?Equipment Recommendations ? None recommended by PT  ?  ?Recommendations for Other Services   ? ? ?  ?Precautions / Restrictions Precautions ?Precautions: Fall ?Precaution  Comments: R hemiplegia from prior CVAs ?Restrictions ?Weight Bearing Restrictions: No  ?  ? ?Mobility ? Bed Mobility ?Overal bed mobility: Needs Assistance ?Bed Mobility: Rolling, Sidelying to Sit, Sit to Sidelying ?Rolling: Max assist (to her left) ?Sidelying to sit: Max assist, HOB elevated ?  ?  ?Sit to sidelying: Total assist ?General bed mobility comments: pt initiates moving toward EOB, but needs significant assistance for all aspects ?  ? ?Transfers ?Overall transfer level: Needs assistance ?  ?Transfers: Sit to/from Stand ?  ?  ?  ?  ?  ?  ?General transfer comment: unable to stand with +1 assist from EOB x 3 attempts; pt moans from pain and does not engage even LLE ?  ? ?Ambulation/Gait ?  ?  ?  ?  ?  ?  ?  ?General Gait Details: unable ? ? ?Stairs ?  ?  ?  ?  ?  ? ? ?Wheelchair Mobility ?  ? ?Modified Rankin (Stroke Patients Only) ?Modified Rankin (Stroke Patients Only) ?Pre-Morbid Rankin Score: Moderately severe disability ?Modified Rankin: Severe disability ? ? ?  ?Balance Overall balance assessment: Needs assistance ?Sitting-balance support: Single extremity supported, Feet supported ?Sitting balance-Leahy Scale: Poor ?Sitting balance - Comments: UE support and minA to sit EOB ?  ?  ?  ?  ?  ?  ?  ?  ?  ?  ?  ?  ?  ?  ?  ?  ? ?  ?Cognition Arousal/Alertness: Awake/alert ?Behavior During Therapy: Flat affect ?Overall Cognitive Status: No family/caregiver present to determine baseline cognitive functioning ?Area of Impairment: Problem solving, Following commands ?  ?  ?  ?  ?  ?  ?  ?  ?  ?  ?  ?Following Commands: Follows one step commands with increased time ?  ?Awareness: Intellectual ?Problem Solving: Decreased  initiation, Difficulty sequencing, Requires tactile cues, Requires verbal cues ?General Comments: No family available to determine baseline. ?  ?  ? ?  ?Exercises   ? ?  ?General Comments General comments (skin integrity, edema, etc.): PROM to RUE and RLE due to flexor tone and able to achieve  neutral in all planes with LE, but not full ROM. Patient with "stretching" pain with ROM. In sitting worked on trunk rotation due to reports of rib pain/tightness when attempting to stand. ?  ?  ? ?Pertinent Vitals/Pain Pain Assessment ?Pain Assessment: 0-10 ?Pain Score: 10-Worst pain ever ?Pain Location: bil shoulders ?Pain Descriptors / Indicators: Aching ?Pain Intervention(s): Limited activity within patient's tolerance, Monitored during session  ? ? ?Home Living   ?  ?  ?  ?  ?  ?  ?  ?  ?  ?   ?  ?Prior Function    ?  ?  ?   ? ?PT Goals (current goals can now be found in the care plan section) Acute Rehab PT Goals ?Patient Stated Goal: to go to SNF ?Time For Goal Achievement: 01/21/22 ?Potential to Achieve Goals: Fair ?Progress towards PT goals: Not progressing toward goals - comment (increased tightness/pain with mobility/ROM) ? ?  ?Frequency ? ? ? Min 3X/week ? ? ? ?  ?PT Plan Current plan remains appropriate  ? ? ?Co-evaluation   ?  ?  ?  ?  ? ?  ?AM-PAC PT "6 Clicks" Mobility   ?Outcome Measure ? Help needed turning from your back to your side while in a flat bed without using bedrails?: A Lot ?Help needed moving from lying on your back to sitting on the side of a flat bed without using bedrails?: A Lot ?Help needed moving to and from a bed to a chair (including a wheelchair)?: Total ?Help needed standing up from a chair using your arms (e.g., wheelchair or bedside chair)?: Total ?Help needed to walk in hospital room?: Total ?Help needed climbing 3-5 steps with a railing? : Total ?6 Click Score: 8 ? ?  ?End of Session Equipment Utilized During Treatment: Gait belt ?Activity Tolerance: Patient limited by pain ?Patient left: in bed;with call bell/phone within reach;with bed alarm set ?Nurse Communication: Mobility status ?PT Visit Diagnosis: Unsteadiness on feet (R26.81);Other abnormalities of gait and mobility (R26.89);Muscle weakness (generalized) (M62.81);Difficulty in walking, not elsewhere classified  (R26.2);Other symptoms and signs involving the nervous system (R29.898) ?  ? ? ?Time: 4174-0814 ?PT Time Calculation (min) (ACUTE ONLY): 32 min ? ?Charges:  $Neuromuscular Re-education: 23-37 mins          ?          ? ? ?Jerolyn Center, PT ?Acute Rehabilitation Services  ?Pager [phone] ?Office [phone] ? ? ? ?Scherrie November Laurabeth Yip ?01/09/2022, 11:28 AM ? ?

## 2022-01-10 DIAGNOSIS — E1149 Type 2 diabetes mellitus with other diabetic neurological complication: Secondary | ICD-10-CM

## 2022-01-10 LAB — CBC
HCT: 36.9 % (ref 36.0–46.0)
Hemoglobin: 11.8 g/dL — ABNORMAL LOW (ref 12.0–15.0)
MCH: 28.3 pg (ref 26.0–34.0)
MCHC: 32 g/dL (ref 30.0–36.0)
MCV: 88.5 fL (ref 80.0–100.0)
Platelets: 340 10*3/uL (ref 150–400)
RBC: 4.17 MIL/uL (ref 3.87–5.11)
RDW: 18.2 % — ABNORMAL HIGH (ref 11.5–15.5)
WBC: 9.8 10*3/uL (ref 4.0–10.5)
nRBC: 0 % (ref 0.0–0.2)

## 2022-01-10 LAB — BASIC METABOLIC PANEL
Anion gap: 9 (ref 5–15)
BUN: 14 mg/dL (ref 8–23)
CO2: 21 mmol/L — ABNORMAL LOW (ref 22–32)
Calcium: 9.2 mg/dL (ref 8.9–10.3)
Chloride: 107 mmol/L (ref 98–111)
Creatinine, Ser: 0.94 mg/dL (ref 0.44–1.00)
GFR, Estimated: >60 mL/min (ref >60)
Glucose, Bld: 118 mg/dL — ABNORMAL HIGH (ref 70–99)
Potassium: 3.3 mmol/L — ABNORMAL LOW (ref 3.5–5.1)
Sodium: 137 mmol/L (ref 135–145)

## 2022-01-10 MED ORDER — POTASSIUM CHLORIDE CRYS ER 20 MEQ PO TBCR
40.0000 meq | EXTENDED_RELEASE_TABLET | Freq: Once | ORAL | Status: AC
  Administered 2022-01-10: 40 meq via ORAL
  Filled 2022-01-10: qty 2

## 2022-01-10 NOTE — TOC Progression Note (Signed)
Transition of Care (TOC) - Progression Note  ? ? ?Patient Details  ?Name: Jade [name] ?MRN: [id number] ?Date of Birth: 10/03/1955 ? ?Transition of Care (TOC) CM/SW Contact  ?Mearl Latin, LCSW ?Phone Number: ?01/10/2022, 10:23 AM ? ?Clinical Narrative:    ?CSW received voicemail from patient's son requesting [facility] as patient has been there before. ? ?CSW spoke with patient and she confirmed that it was fine to switch to [facility] as she is "letting her son drive the ship". CSW provided active listening as patient lamented that she may not have much time left on earth due to her health issues. [facility] is able to accept patient today pending insurance authorization if patient is medically stable. CSW updated Navi of facility choice, Ref# [id number].   ? ? ?Expected Discharge Plan: Skilled Nursing Facility ?Barriers to Discharge: English as a second language teacher, SNF Pending bed offer, Continued Medical Work up ? ?Expected Discharge Plan and Services ?Expected Discharge Plan: Skilled Nursing Facility ?  ?Discharge Planning Services: CM Consult ?Post Acute Care Choice: Skilled Nursing Facility ?Living arrangements for the past 2 months: Single Family Home ?                ?  ?  ?  ?  ?  ?  ?  ?  ?  ?  ? ? ?Social Determinants of Health (SDOH) Interventions ?  ? ?Readmission Risk Interventions ? ?  06/30/2021  ?  1:48 PM  ?Readmission Risk Prevention Plan  ?Transportation Screening Complete  ?PCP or Specialist Appt within 3-5 Days Complete  ?HRI or Home Care Consult Complete  ?Social Work Consult for Recovery Care Planning/Counseling Complete  ?Palliative Care Screening Not Applicable  ? ? ?

## 2022-01-10 NOTE — Plan of Care (Signed)
  Problem: Health Behavior/Discharge Planning: Goal: Ability to manage health-related needs will improve Outcome: Progressing   Problem: Activity: Goal: Risk for activity intolerance will decrease Outcome: Progressing   Problem: Nutrition: Goal: Adequate nutrition will be maintained Outcome: Progressing   Problem: Coping: Goal: Level of anxiety will decrease Outcome: Progressing   Problem: Pain Managment: Goal: General experience of comfort will improve Outcome: Progressing   Problem: Safety: Goal: Ability to remain free from injury will improve Outcome: Progressing   

## 2022-01-10 NOTE — Progress Notes (Signed)
This chaplain is present with the Pt. for continued Advance Directive education and clarifying questions. The chaplain understands the Pt. is ready to complete her AD:  HCPOA and Living Will. ? ?This chaplain is present with the Pt., notary, and witnesses for the notarizing of the Pt. AD. ? ?The Pt. named [name] as HCPOA . If the healthcare agent is unwilling or unable to serve in this role, the Pt. next choice is [facility]. ? ?The chaplain gave the Pt. the original AD along with two copies. The chaplain scanned the Pt. AD into the Pt. EMR. ? ?This chaplain is available for F/U spiritual care as needed. ? ?Chaplain Sallyanne Kuster ?[phone] ?

## 2022-01-11 DIAGNOSIS — R531 Weakness: Secondary | ICD-10-CM | POA: Diagnosis not present

## 2022-01-11 DIAGNOSIS — I69398 Other sequelae of cerebral infarction: Secondary | ICD-10-CM | POA: Diagnosis not present

## 2022-01-11 DIAGNOSIS — I69998 Other sequelae following unspecified cerebrovascular disease: Secondary | ICD-10-CM | POA: Diagnosis not present

## 2022-01-11 DIAGNOSIS — I10 Essential (primary) hypertension: Secondary | ICD-10-CM | POA: Diagnosis not present

## 2022-01-11 LAB — BASIC METABOLIC PANEL
Anion gap: 11 (ref 5–15)
BUN: 15 mg/dL (ref 8–23)
CO2: 20 mmol/L — ABNORMAL LOW (ref 22–32)
Calcium: 9.3 mg/dL (ref 8.9–10.3)
Chloride: 107 mmol/L (ref 98–111)
Creatinine, Ser: 0.89 mg/dL (ref 0.44–1.00)
GFR, Estimated: >60 mL/min (ref >60)
Glucose, Bld: 119 mg/dL — ABNORMAL HIGH (ref 70–99)
Potassium: 3.7 mmol/L (ref 3.5–5.1)
Sodium: 138 mmol/L (ref 135–145)

## 2022-01-11 MED ORDER — TIZANIDINE HCL 2 MG PO TABS: 2.0000 mg | ORAL_TABLET | Freq: Every evening | ORAL | 0 refills | Status: AC | PRN

## 2022-01-11 MED ORDER — CLOPIDOGREL BISULFATE 75 MG PO TABS: 75.0000 mg | ORAL_TABLET | Freq: Every day | ORAL | 11 refills | Status: AC

## 2022-01-11 MED ORDER — LIDOCAINE 5 % EX PTCH: 1.0000 | MEDICATED_PATCH | Freq: Every day | CUTANEOUS | 0 refills | Status: DC

## 2022-01-11 MED ORDER — ENSURE ENLIVE PO LIQD: 237.0000 mL | Freq: Two times a day (BID) | ORAL | 12 refills | Status: AC

## 2022-01-11 MED ORDER — POLYETHYLENE GLYCOL 3350 17 G PO PACK: 17.0000 g | PACK | Freq: Every day | ORAL | 0 refills | Status: DC

## 2022-01-11 NOTE — TOC Progression Note (Signed)
Transition of Care [facility]) - Progression Note    Patient Details  Name: CARNELIA OSCAR MRN: [id number] Date of Birth: [birth year]  [facility]) CM/SW Contact  Mearl Latin, LCSW Phone Number: 01/11/2022, 1:19 PM  Clinical Narrative:    Insurance approval received for [facility], Ref# [id number], Auth ID# [id number], effective 01/10/2022-01/12/2022.   Expected Discharge Plan: Skilled Nursing Facility Barriers to Discharge: Barriers Resolved  Expected Discharge Plan and Services Expected Discharge Plan: Skilled Nursing Facility   Discharge Planning Services: CM Consult Post Acute Care Choice: Skilled Nursing Facility Living arrangements for the past 2 months: Single Family Home Expected Discharge Date: 01/11/22                                     Social Determinants of Health (SDOH) Interventions    Readmission Risk Interventions    06/30/2021    1:48 PM  Readmission Risk Prevention Plan  Transportation Screening Complete  PCP or Specialist Appt within 3-5 Days Complete  HRI or Home Care Consult Complete  Social Work Consult for Recovery Care Planning/Counseling Complete  Palliative Care Screening Not Applicable

## 2022-01-11 NOTE — TOC Transition Note (Signed)
Transition of Care [facility]) - CM/SW Discharge Note   Patient Details  Name: Jade [name] MRN: [id number] Date of Birth: June 29, 1956  Transition of Care [facility]) CM/SW Contact:  Mearl Latin, LCSW Phone Number: 01/11/2022, 1:20 PM   Clinical Narrative:    Patient will DC to: [facility] Anticipated DC date: 01/11/22 Family notified: Son, [name] Transport by: Sharin Mons   Per MD patient ready for DC to [facility]. RN to call report prior to discharge [phone] room 107a). RN, patient, patient's family, and facility notified of DC. CSW printed another Medicaid application for son as requested and informed him of outpatient palliative follow up. Discharge Summary and FL2 sent to facility. DC packet on chart including signed DNR. Ambulance transport requested for patient.   CSW will sign off for now as social work intervention is no longer needed. Please consult [place] again if new needs arise.     Final next level of care: Skilled Nursing Facility Barriers to Discharge: Barriers Resolved   Patient Goals and CMS Choice Patient states their goals for this hospitalization and ongoing recovery are:: Pt agreeable to SNF at DC. CMS Medicare.gov Compare Post Acute Care list provided to:: Patient Choice offered to / list presented to : Patient  Discharge Placement   Existing PASRR number confirmed : 01/11/22          Patient chooses bed at: [facility] Patient to be transferred to facility by: [facility] Name of family member notified: Son Patient and family notified of of transfer: 01/11/22  Discharge Plan and Services   Discharge Planning Services: CM Consult Post Acute Care Choice: Skilled Nursing Facility                               Social Determinants of Health (SDOH) Interventions     Readmission Risk Interventions    06/30/2021    1:48 PM  Readmission Risk Prevention Plan  Transportation Screening Complete  PCP or Specialist Appt within 3-5  Days Complete  HRI or Home Care Consult Complete  Social Work Consult for Recovery Care Planning/Counseling Complete  Palliative Care Screening Not Applicable

## 2022-01-11 NOTE — Progress Notes (Signed)
FPTS Brief Progress Note  S:Patient laying in bed when I entered room. Endorses some lower extremity weakness. States she has not walked around much.    O: BP (!) 149/95 (BP Location: Right Wrist)   Pulse 100   Temp 98.8 F (37.1 C) (Oral)   Resp 18   SpO2 95%   Physical exam General: well appearing, NAD Cardiovascular: tachycardic  Lungs: Normal WOB Abdomen: soft, non-distended Skin: warm, dry. No edema  A/P: - Orders reviewed. Labs for AM ordered, which was adjusted as needed.   See plan per day team's note  Cora Collum, DO 01/11/2022, 12:10 AM PGY-2,  Family Medicine Night Resident  Please page [phone] with questions.

## 2022-01-11 NOTE — Progress Notes (Signed)
Spoke with [facility] and patient has not been seen by their office - they had contacted patient multiple times but appointment was never set up

## 2022-01-11 NOTE — Progress Notes (Signed)
Family Medicine Teaching Service Daily Progress Note Intern Pager: [phone]  Patient name: Jade [name] Medical record number: [id number] Date of birth: [birth year] Age: 66 y.o. Gender: female  Primary Care Provider: Sue Lush, PA-C Consultants: Palliative care Code Status: DNR  Pt Overview and Major Events to Date:  5/13 admitted, started on CTX for UTI 5/16 Ucx >100,000 colonies K pneumoniae-not symptomatic-Abx d/c'd  Assessment and Plan:   Jade [name] is a 66 yo female presenting with generalized weakness in the setting of likely viral gastroenteritis, UTI and residual weakness from prior CVA. PMH significant for multiple CVAs with residual weakness greater on R side, L carotid stenosis s/p stent, HTN, diet controlled T2DM, HLD, former smoker, COPD  SNF-medically stable pending insurance authorization  Weakness, multifactorial  history of CVA with residual right-sided weakness  dehydration secondary to viral gastroenteritis resolved Denies any acute issues today and that is at baseline with right-sided weakness.  Awaiting insurance authorization for SNF -ASA 81 mg daily -Zofran ODT 4 mg every 8 hours as needed -PT following  K pneumoniae UTI, asymptomatic No longer on antibiotics, denies any dysuria today or yesterday.  Says that it "comes and goes".  No abdominal pain today, has remained afebrile -Monitor for symptoms  HTN Blood pressure ranges have been 134-164/78-95.  -Continue home amlodipine 10 mg daily  Goals of care See palliative care note from 5/15, son is healthcare power of attorney.  Plan for SNF. -Palliative following, will follow her outpatient -DNR  Chronic/stable HLD-atorvastatin 80 Diet-controlled T2DM-glucose 119 Vitamin D deficiency-supplement Back pain-tizanidine as needed Malnutrition-Ensure twice daily Duodenal ulcer-Protonix 40 mg  FEN/GI: Heart healthy PPx: Lovenox Dispo: SNF-medically stable pending insurance  authorization  Subjective:  No acute issues or complaints this morning  Objective: Temp:  [97.9 F (36.6 C)-98.9 F (37.2 C)] 98.3 F (36.8 C) (05/18 0817) Pulse Rate:  [70-100] 70 (05/18 0817) Resp:  [16-20] 16 (05/18 0817) BP: (134-161)/(78-95) 161/91 (05/18 0817) SpO2:  [95 %-100 %] 100 % (05/18 0817) Physical Exam: General: NAD, laying in bed comfortably Cardiovascular: No murmurs rubs or gallops Respiratory: Clear to auscultation bilaterally no wheezes rales or crackles Abdomen: Soft, nontender, nondistended Extremities: No lower extremity edema, chronic right-sided weakness  Laboratory: Recent Labs  Lab 01/07/22 0411 01/09/22 0115 01/10/22 0055  WBC 9.3 10.4 9.8  HGB 12.7 12.2 11.8*  HCT 39.1 37.4 36.9  PLT 348 358 340   Recent Labs  Lab 01/06/22 1715 01/06/22 1819 01/09/22 0115 01/10/22 0055 01/11/22 0101  NA 137   < > 137 137 138  K 3.9   < > 3.6 3.3* 3.7  CL 105   < > 110 107 107  CO2 22   < > 19* 21* 20*  BUN 14   < > 13 14 15   CREATININE 1.00   < > 0.92 0.94 0.89  CALCIUM 9.6   < > 9.2 9.2 9.3  PROT 6.9  --   --   --   --   BILITOT 0.8  --   --   --   --   ALKPHOS 66  --   --   --   --   ALT 16  --   --   --   --   AST 27  --   --   --   --   GLUCOSE 202*   < > 121* 118* 119*   < > = values in this interval not displayed.    Imaging/Diagnostic Tests:  Gerrit Heck, MD 01/11/2022, 8:21 AM PGY-1, [place] Intern pager: [phone], text pages welcome

## 2022-01-11 NOTE — Discharge Summary (Addendum)
Family Medicine Teaching [facility] Discharge Summary  Patient name: Jade [name] Medical record number: [id number] Date of birth: [birth year] Age: 66 y.o. Gender: female Date of Admission: 01/06/2022  Date of Discharge: 01/11/2022 Admitting Physician: Littie Deeds, MD  Primary Care Provider: Nathaneil Canary, PA-C Consultants: Palliative care  Indication for Hospitalization: Weakness  Discharge Diagnoses/Problem List:  Principal Problem:   Weakness following cerebrovascular accident (CVA) Active Problems:   Diabetes mellitus type 2 with neurological manifestations (HCC)   Essential hypertension, benign   Weakness due to old stroke   Urinary tract infection without hematuria   Goals of care, counseling/discussion   Disposition: SNF  Discharge Condition: stable  Discharge Exam:  General: NAD, laying in bed comfortably Cardiovascular: No murmurs rubs or gallops Respiratory: Clear to auscultation bilaterally no wheezes rales or crackles Abdomen: Soft, nontender, nondistended Extremities: No lower extremity edema, chronic right-sided weakness in UE and LE  Brief Hospital Course:  Weakness, multifactorial History of CVA with residual right-sided weakness Dehydration secondary to viral gastroenteritis Baseline with significant right-sided weakness secondary to multiple CVAs most recently January 2023 presenting with gradually worsening weakness in the past several weeks and viral gastroenteritis. CTA head and neck did not reveal any acute changes but did reveal new chronic infarcts in the left PCA and posterior right MCA distribution. SNF placement was recommended. Patient's gastroenteritis was resolved at discharge.  UTI >100,000 K pneumoniae on Ucx with dysuria and frequency.Treated with CTX (5/14-5/15). This was discontinued after culture only sensitive to PIP Tazo and imipenem with intermediate sensitivity to nitrofurantoin on culture and patient was asymptomatic at this  time. Remained stable without recurrence of dysuria or fever.  Goals of Care Significant physical disability from most recent CVA in January 2023, patient has significant frustration as she requires significant assistance with ADLs. Palliative care was consulted and HCPOA named [name] (son) and patient DNR with outpatient palliative follow up.    Issues for Follow Up:  Was discharged on aspirin and plavix on hospital discharge 08/2021 and spoke with [facility] office and patient had not been seen. Advise follow up with neurology on whether patient should be continued on DAPT long term (was discharged on aspirin and plavix and has been filling plavix outpatient)  Significant Procedures:   Significant Labs and Imaging:  Recent Labs  Lab 01/07/22 0411 01/09/22 0115 01/10/22 0055  WBC 9.3 10.4 9.8  HGB 12.7 12.2 11.8*  HCT 39.1 37.4 36.9  PLT 348 358 340   Recent Labs  Lab 01/06/22 1715 01/06/22 1819 01/07/22 0411 01/08/22 0204 01/09/22 0115 01/10/22 0055 01/11/22 0101  NA 137   < > 138 139 137 137 138  K 3.9   < > 3.2* 3.2* 3.6 3.3* 3.7  CL 105   < > 108 107 110 107 107  CO2 22  --  20* 23 19* 21* 20*  GLUCOSE 202*   < > 118* 115* 121* 118* 119*  BUN 14   < > 9 9 13 14 15   CREATININE 1.00   < > 0.78 0.80 0.92 0.94 0.89  CALCIUM 9.6  --  9.3 9.5 9.2 9.2 9.3  ALKPHOS 66  --   --   --   --   --   --   AST 27  --   --   --   --   --   --   ALT 16  --   --   --   --   --   --  ALBUMIN 3.8  --   --   --   --   --   --    < > = values in this interval not displayed.    Results/Tests Pending at Time of Discharge:   Discharge Medications:  Allergies as of 01/11/2022       Reactions   Baclofen Other (See Comments)   Pt said felt weaker and had slurred speech   Nickel Dermatitis, Rash        Medication List     STOP taking these medications    ibuprofen 200 MG tablet Commonly known as: ADVIL   loperamide 2 MG capsule Commonly known as: IMODIUM   ZINC PO        TAKE these medications    acetaminophen 325 MG tablet Commonly known as: TYLENOL Take 2 tablets (650 mg total) by mouth every 6 (six) hours as needed for mild pain, moderate pain, fever or headache (or temp > 37.5 C (99.5 F)).   amLODipine 10 MG tablet Commonly known as: NORVASC Take 10 mg by mouth daily.   aspirin EC 81 MG tablet Take 81 mg by mouth daily. Swallow whole.   atorvastatin 80 MG tablet Commonly known as: LIPITOR Take 80 mg by mouth every evening.   B-complex with vitamin C tablet Take 1 tablet by mouth daily.   cholecalciferol 25 MCG (1000 UNIT) tablet Commonly known as: VITAMIN D3 Take 1,000 Units by mouth daily.   clopidogrel 75 MG tablet Commonly known as: Plavix Take 1 tablet (75 mg total) by mouth daily.   DRY EYES OP Place 1 drop into both eyes 2 (two) times daily as needed (dry eyes).   feeding supplement Liqd Take 237 mLs by mouth 2 (two) times daily between meals.   lidocaine 5 % Commonly known as: LIDODERM Place 1 patch onto the skin daily. Remove & Discard patch within 12 hours or as directed by MD Start taking on: Jan 12, 2022   MULTIVITAMIN ADULTS 50+ PO Take 1 tablet by mouth daily.   pantoprazole 40 MG tablet Commonly known as: Protonix Take 1 tablet (40 mg total) by mouth 2 (two) times daily. Please take 40 mg oral twice daily x12 weeks, then take 40 mg oral once daily. What changed:  when to take this additional instructions   polyethylene glycol 17 g packet Commonly known as: MIRALAX / GLYCOLAX Take 17 g by mouth daily. Start taking on: Jan 12, 2022   tiZANidine 2 MG tablet Commonly known as: ZANAFLEX Take 1 tablet (2 mg total) by mouth at bedtime as needed for muscle spasms. What changed:  when to take this reasons to take this       Discharge Instructions: Please refer to Patient Instructions section of EMR for full details.  Patient was counseled important signs and symptoms that should prompt return to  medical care, changes in medications, dietary instructions, activity restrictions, and follow up appointments.   Follow-Up Appointments:  Contact information for follow-up providers     [facility] Follow up.   Specialty: Hospice and Palliative Medicine Why: Palliative care will follow you. Please call them with questions or if you need to transition to hospice care. Contact information: [address] [place] [address] [phone]        [facility]. Schedule an appointment as soon as possible for a visit.   Why: For follow up Contact information: [address]     [address] [place] [address] [phone]  Contact information for after-discharge care     Destination     [facility] Preferred SNF .   Service: Skilled Nursing Contact information: [address] [place] [address] [phone]                    Levin Erp, MD 01/11/2022, 12:01 PM PGY-1, [facility] Family Medicine

## 2022-01-11 NOTE — Progress Notes (Signed)
Physical Therapy Treatment Patient Details Name: Jade [name] MRN: [id number] DOB: 1956-02-22 Today's Date: 01/11/2022   History of Present Illness 66 y.o. female presenting 01/06/22 with generalized weakness in the setting of likely viral gastroenteritis, UTI, and residual weakness from prior CVA. CT head left PCA and posterior right MCA distribution infarcts, new from prior. Additional chronic left frontal infarct is stable. PMH: arthritis, CKD, COPD, CVAs with R hemiparesis, fibromyalgia, gout, HTN, migraine, OSA, DM2    PT Comments    Pt making slow progress with mobility.Continues to have generalized pain with any mobility. Continue to recommend SNF at DC.    Recommendations for follow up therapy are one component of a multi-disciplinary discharge planning process, led by the attending physician.  Recommendations may be updated based on patient status, additional functional criteria and insurance authorization.  Follow Up Recommendations  Skilled nursing-short term rehab (<3 hours/day)     Assistance Recommended at Discharge Frequent or constant Supervision/Assistance  Patient can return home with the following Two people to help with walking and/or transfers;Two people to help with bathing/dressing/bathroom;Assistance with cooking/housework;Direct supervision/assist for medications management;Direct supervision/assist for financial management;Assist for transportation;Help with stairs or ramp for entrance   Equipment Recommendations  None recommended by PT    Recommendations for Other Services       Precautions / Restrictions Precautions Precautions: Fall Precaution Comments: R hemiplegia from prior CVAs Restrictions Weight Bearing Restrictions: No     Mobility  Bed Mobility Overal bed mobility: Needs Assistance Bed Mobility: Supine to Sit, Sit to Supine     Supine to sit: +2 for physical assistance, Max assist Sit to supine: +2 for physical assistance, Max assist    General bed mobility comments: Assist to bring legs off of bed, elevate trunk into sitting, and bring hips to EOB. Assist to lower trunk and bring legs back into bed    Transfers Overall transfer level: Needs assistance Equipment used: 2 person hand held assist Transfers: Sit to/from Stand Sit to Stand: +2 physical assistance, Total assist           General transfer comment: Attempted to use Stedy but pt unable to reach up high enough with LUE to hold crossbar. Used chair turned around backwards but pt's RLE sliding forward and adducting and unable to stand with +2 max. Used face to face and blocking RLE and 2nd person on lt side - able to partially stand using bed pad under hips    Ambulation/Gait               General Gait Details: unable   Stairs             Wheelchair Mobility    Modified Rankin (Stroke Patients Only) Modified Rankin (Stroke Patients Only) Pre-Morbid Rankin Score: Moderately severe disability Modified Rankin: Severe disability     Balance Overall balance assessment: Needs assistance Sitting-balance support: Single extremity supported, Feet supported Sitting balance-Leahy Scale: Poor Sitting balance - Comments: UE support and minA to sit EOB   Standing balance support: Single extremity supported Standing balance-Leahy Scale: Zero Standing balance comment: Partial stand with +2 total                            Cognition Arousal/Alertness: Awake/alert Behavior During Therapy: Flat affect Overall Cognitive Status: No family/caregiver present to determine baseline cognitive functioning Area of Impairment: Problem solving, Following commands  Following Commands: Follows one step commands with increased time   Awareness: Intellectual Problem Solving: Decreased initiation, Difficulty sequencing, Requires tactile cues, Requires verbal cues General Comments: No family available to determine  baseline.        Exercises      General Comments        Pertinent Vitals/Pain Pain Assessment Pain Assessment: 0-10 Pain Score: 10-Worst pain ever Pain Location: everywhere Pain Descriptors / Indicators: Aching Pain Intervention(s): Limited activity within patient's tolerance, Repositioned    Home Living                          Prior Function            PT Goals (current goals can now be found in the care plan section) Acute Rehab PT Goals Patient Stated Goal: to go to SNF Progress towards PT goals: Progressing toward goals    Frequency    Min 2X/week      PT Plan Current plan remains appropriate;Frequency needs to be updated    Co-evaluation              AM-PAC PT "6 Clicks" Mobility   Outcome Measure  Help needed turning from your back to your side while in a flat bed without using bedrails?: A Lot Help needed moving from lying on your back to sitting on the side of a flat bed without using bedrails?: A Lot Help needed moving to and from a bed to a chair (including a wheelchair)?: Total Help needed standing up from a chair using your arms (e.g., wheelchair or bedside chair)?: Total Help needed to walk in hospital room?: Total Help needed climbing 3-5 steps with a railing? : Total 6 Click Score: 8    End of Session Equipment Utilized During Treatment: Gait belt Activity Tolerance: Patient limited by pain Patient left: in bed;with call bell/phone within reach;with bed alarm set   PT Visit Diagnosis: Unsteadiness on feet (R26.81);Other abnormalities of gait and mobility (R26.89);Muscle weakness (generalized) (M62.81);Difficulty in walking, not elsewhere classified (R26.2);Other symptoms and signs involving the nervous system (R29.898)     Time: 7262-0355 PT Time Calculation (min) (ACUTE ONLY): 27 min  Charges:  $Therapeutic Activity: 8-22 mins                     [facility] PT Acute Rehabilitation Services Office  [phone]    Angelina Ok [facility] 01/11/2022, 12:59 PM

## 2022-01-11 NOTE — Progress Notes (Signed)
Report called to [employer]. Receiving nurse [place] B. No further questions at this time.

## 2022-01-23 ENCOUNTER — Non-Acute Institutional Stay: Payer: Medicare Other | Admitting: Hospice

## 2022-01-23 DIAGNOSIS — Z515 Encounter for palliative care: Secondary | ICD-10-CM

## 2022-01-23 DIAGNOSIS — I69398 Other sequelae of cerebral infarction: Secondary | ICD-10-CM

## 2022-01-23 DIAGNOSIS — R4189 Other symptoms and signs involving cognitive functions and awareness: Secondary | ICD-10-CM

## 2022-01-23 DIAGNOSIS — M545 Low back pain, unspecified: Secondary | ICD-10-CM

## 2022-01-23 NOTE — Progress Notes (Signed)
Therapist, nutritional Palliative Care Consult Note Telephone: [phone]  Fax: [phone]  PATIENT NAME: Jade [name] [address] [place] [address] [id number] (home)  DOB: July 04, 1956 MRN: [id number]  PRIMARY CARE PROVIDER:    Leonard Downing,  [address] [place] [address] [phone]  REFERRING PROVIDER:   Nathaneil Canary, PA-C [address] [place],  [place] [address] [phone]  RESPONSIBLE PARTY:   Self/[name] Contact Information     Name Relation Home Work Mobile   Shimer,Jose Spouse  [phone]   Bernita Buffy   [phone]        I met face to face with patient  at facility. Visit to build trust and highlight Palliative Medicine as specialized medical care for people living with serious illness, aimed at facilitating better quality of life through symptoms relief, assisting with advance care planning and complex medical decision making.  NP called [name] and could not leave a voicemail because voicemail was full. ASSESSMENT AND / RECOMMENDATIONS:   Advance Care Planning: Our advance care planning conversation included a discussion about:    The value and importance of advance care planning  Difference between Hospice and Palliative care Exploration of goals of care in the event of a sudden injury or illness  Identification and preparation of a healthcare agent  Review and updating or creation of an  advance directive document . Decision not to resuscitate or to de-escalate disease focused treatments due to poor prognosis.  CODE STATUS: Patient affirmed she is a Do Not Resuscitate.   Goals of Care: Goals include to maximize quality of life and symptom management.  MOST selections include no feeding tube, limited additional intervention, IV fluids for a defined trial period, determine use of antibiotics.   I spent 16 minutes providing this initial consultation.  More than 50% of the time in this consultation was spent on counseling patient and coordinating communication. --------------------------------------------------------------------------------------------------------------------------------------  Symptom Management/Plan: Weakness: following recent CVA. PT/OT is ongoing for strengthening, mobility and environmental adaptation.  Low back pain: Managed with Lidocaine patch, Tramadol, Tizanidine  Cognitive impairment: Likely related to recent and previous strokes.  Currently walking with SLP for cognition.  Neurologist consult needed.  Routine CBC CMP  Follow up: Palliative care will continue to follow for complex medical decision making, advance care planning, and clarification of goals. Return 6 weeks or prn. Encouraged to call provider sooner with any concerns.   Family /Caregiver/Community Supports: Patient in SNF for ongoing care  HOSPICE ELIGIBILITY/DIAGNOSIS: TBD  Chief Complaint: Initial Palliative care visit  HISTORY OF PRESENT ILLNESS:  Jade [name] is a 66 y.o. year old female  with multiple morbidities requiring close monitoring and with high risk of complications and  mortality:  CVA, weakness due to CVA, cognitive impairment, low back pain.  History obtained from review of EMR, discussion with primary team, caregiver, family and/or Ms. Berryman.  Review and summarization of Epic records shows history from other than patient. Rest of 10 point ROS asked and negative. Independent interpretation of tests and reviewed as needed, available labs, patient records, imaging, studies and related documents from the EMR.   Physical Exam: Constitutional: NAD General: Well groomed, cooperative EYES: anicteric sclera, lids intact, no discharge  ENMT: Moist mucous membrane CV: S1 S2, RRR, no LE edema Pulmonary: LCTA, no increased work of breathing, no cough, Abdomen: active BS + 4 quadrants, soft and non tender GU: no suprapubic  tenderness MSK: Generalized  weakness, right upper extremity paresis, limited ROM Skin: warm and dry, no rashes or wounds on visible skin Neuro:  weakness, otherwise non focal, alert and oriented to person, confabulates. Psych: non-anxious affect Hem/lymph/immuno: no widespread bruising   PAST MEDICAL HISTORY:  Active Ambulatory Problems    Diagnosis Date Noted   Diabetes mellitus type 2 with neurological manifestations ([place]) 03/20/2012   Pure hypercholesterolemia 03/20/2012   Chronic venous insufficiency 03/20/2012   Diabetic neuropathy, painful ([place]) 05/26/2012   Osteopenia 09/26/2012   H/O abnormal Pap smear 10/24/2012   Patient noncompliant with statin medication 02/23/2013   Low back pain 08/24/2013   Tinea corporis 12/21/2013   Essential hypertension, benign 12/21/2013   Visit for screening mammogram 08/30/2014   Routine general medical examination at a health care facility 08/30/2014   Primary osteoarthritis of left knee 09/29/2014   Hair loss 10/20/2014   IBS (irritable bowel syndrome) 04/12/2015   Acute CVA (cerebrovascular accident) ([place]) 03/21/2018   Obesity, Class III, BMI 40-49.9 (morbid obesity) ([place]) 03/21/2018   Stroke-like symptoms 03/05/2021   TIA (transient ischemic attack) 04/02/2021   Carotid artery stenosis with cerebral infarction ([place]) 04/02/2021   COPD (chronic obstructive pulmonary disease) ([place]) 04/02/2021   CVA (cerebral vascular accident) ([place]) 04/13/2021   Spastic hemiparesis ([place])    Stage 3a chronic kidney disease ([place])    Controlled type 2 diabetes mellitus with hyperglycemia, without long-term current use of insulin (HCC)    Labile blood glucose    Essential hypertension    Weakness due to old stroke 06/17/2021   Cerebral thrombosis with cerebral infarction 06/19/2021   COVID-19 06/25/2021   Acute arterial ischemic stroke, multifocal, posterior circulation, unspecified laterality ([place]) 09/16/2021   Anemia 09/16/2021   Duodenal ulcer     Weakness following cerebrovascular accident (CVA) 01/06/2022   Urinary tract infection without hematuria    Goals of care, counseling/discussion    Resolved Ambulatory Problems    Diagnosis Date Noted   Other screening mammogram 03/20/2012   Edema 03/20/2012   Leukocytosis 09/26/2012   Abdominal tenderness, RUQ (right upper quadrant) 09/26/2012   Fatigue 08/24/2013   Left knee pain 09/27/2014   Right flank pain, chronic 11/03/2014   Chest pain 04/05/2015   Past Medical History:  Diagnosis Date   Arthritis    Chronic kidney disease    Fibromyalgia    History of gout    Hyperlipidemia    Hypertension    Migraine    Neuromuscular disorder (HCC)    OSA (obstructive sleep apnea)    Pneumonia ~ 2007   Type 2 diabetes, diet controlled ([place])     SOCIAL HX:  Social History   Tobacco Use   Smoking status: Former    Packs/day: 1.25    Years: 16.00    Pack years: 20.00    Types: Cigarettes    Quit date: 08/28/1987    Years since quitting: 34.4   Smokeless tobacco: Never  Substance Use Topics   Alcohol use: Not Currently     FAMILY HX:  Family History  Problem Relation Age of Onset   Heart disease Father    Leukemia Father    Alcohol abuse Son    Other Mother    Cancer Neg Hx    Diabetes Neg Hx    Hearing loss Neg Hx    Hyperlipidemia Neg Hx    Hypertension Neg Hx    Kidney disease Neg Hx    Stroke Neg Hx       ALLERGIES:  Allergies  Allergen Reactions   Baclofen Other (See Comments)    Pt said felt weaker and had slurred speech   Nickel Dermatitis and Rash      PERTINENT MEDICATIONS:  Outpatient Encounter Medications as of 01/23/2022  Medication Sig   acetaminophen (TYLENOL) 325 MG tablet Take 2 tablets (650 mg total) by mouth every 6 (six) hours as needed for mild pain, moderate pain, fever or headache (or temp > 37.5 C (99.5 F)).   amLODipine (NORVASC) 10 MG tablet Take 10 mg by mouth daily.   Artificial Tear Ointment (DRY EYES OP) Place 1 drop into both  eyes 2 (two) times daily as needed (dry eyes).   aspirin EC 81 MG tablet Take 81 mg by mouth daily. Swallow whole.   atorvastatin (LIPITOR) 80 MG tablet Take 80 mg by mouth every evening.   B Complex-C (B-COMPLEX WITH VITAMIN C) tablet Take 1 tablet by mouth daily.   cholecalciferol (VITAMIN D3) 25 MCG (1000 UNIT) tablet Take 1,000 Units by mouth daily.   clopidogrel (PLAVIX) 75 MG tablet Take 1 tablet (75 mg total) by mouth daily.   feeding supplement (ENSURE ENLIVE / ENSURE PLUS) LIQD Take 237 mLs by mouth 2 (two) times daily between meals.   lidocaine (LIDODERM) 5 % Place 1 patch onto the skin daily. Remove & Discard patch within 12 hours or as directed by MD   Multiple Vitamins-Minerals (MULTIVITAMIN ADULTS 50+ PO) Take 1 tablet by mouth daily.   pantoprazole (PROTONIX) 40 MG tablet Take 1 tablet (40 mg total) by mouth 2 (two) times daily. Please take 40 mg oral twice daily x12 weeks, then take 40 mg oral once daily. (Patient taking differently: Take 40 mg by mouth daily at 2 PM.)   polyethylene glycol (MIRALAX / GLYCOLAX) 17 g packet Take 17 g by mouth daily.   tiZANidine (ZANAFLEX) 2 MG tablet Take 1 tablet (2 mg total) by mouth at bedtime as needed for muscle spasms.   No facility-administered encounter medications on file as of 01/23/2022.     Thank you for the opportunity to participate in the care of Ms. [place].  The palliative care team will continue to follow. Please call our office at [phone] if we can be of additional assistance.   Note: Portions of this note were generated with [employer]. Dictation errors may occur despite best attempts at proofreading.  Teodoro Spray, NP

## 2022-03-17 ENCOUNTER — Emergency Department (HOSPITAL_COMMUNITY)
Admission: EM | Admit: 2022-03-17 | Discharge: 2022-03-18 | Disposition: A | Discharge status: Home / Self Care | Payer: Medicare Other | Attending: Emergency Medicine | Admitting: Emergency Medicine

## 2022-03-17 ENCOUNTER — Emergency Department (HOSPITAL_COMMUNITY): Payer: Medicare Other

## 2022-03-17 ENCOUNTER — Encounter (HOSPITAL_COMMUNITY): Payer: Self-pay

## 2022-03-17 ENCOUNTER — Other Ambulatory Visit: Payer: Self-pay

## 2022-03-17 DIAGNOSIS — M8580 Other specified disorders of bone density and structure, unspecified site: Secondary | ICD-10-CM | POA: Diagnosis not present

## 2022-03-17 DIAGNOSIS — Z79899 Other long term (current) drug therapy: Secondary | ICD-10-CM | POA: Insufficient documentation

## 2022-03-17 DIAGNOSIS — N189 Chronic kidney disease, unspecified: Secondary | ICD-10-CM | POA: Insufficient documentation

## 2022-03-17 DIAGNOSIS — S0001XA Abrasion of scalp, initial encounter: Secondary | ICD-10-CM | POA: Insufficient documentation

## 2022-03-17 DIAGNOSIS — Z8673 Personal history of transient ischemic attack (TIA), and cerebral infarction without residual deficits: Secondary | ICD-10-CM | POA: Diagnosis not present

## 2022-03-17 DIAGNOSIS — Z7902 Long term (current) use of antithrombotics/antiplatelets: Secondary | ICD-10-CM | POA: Insufficient documentation

## 2022-03-17 DIAGNOSIS — N390 Urinary tract infection, site not specified: Secondary | ICD-10-CM | POA: Diagnosis present

## 2022-03-17 DIAGNOSIS — K8689 Other specified diseases of pancreas: Secondary | ICD-10-CM | POA: Diagnosis not present

## 2022-03-17 DIAGNOSIS — X58XXXA Exposure to other specified factors, initial encounter: Secondary | ICD-10-CM | POA: Insufficient documentation

## 2022-03-17 DIAGNOSIS — E119 Type 2 diabetes mellitus without complications: Secondary | ICD-10-CM | POA: Insufficient documentation

## 2022-03-17 DIAGNOSIS — R4182 Altered mental status, unspecified: Secondary | ICD-10-CM | POA: Insufficient documentation

## 2022-03-17 DIAGNOSIS — R531 Weakness: Secondary | ICD-10-CM | POA: Diagnosis not present

## 2022-03-17 DIAGNOSIS — Z7982 Long term (current) use of aspirin: Secondary | ICD-10-CM | POA: Insufficient documentation

## 2022-03-17 DIAGNOSIS — I700 Atherosclerosis of aorta: Secondary | ICD-10-CM | POA: Insufficient documentation

## 2022-03-17 LAB — CBC WITH DIFFERENTIAL/PLATELET
Abs Immature Granulocytes: 0.02 10*3/uL (ref 0.00–0.07)
Basophils Absolute: 0.1 10*3/uL (ref 0.0–0.1)
Basophils Relative: 1 %
Eosinophils Absolute: 0.1 10*3/uL (ref 0.0–0.5)
Eosinophils Relative: 1 %
HCT: 39 % (ref 36.0–46.0)
Hemoglobin: 13.4 g/dL (ref 12.0–15.0)
Immature Granulocytes: 0 %
Lymphocytes Relative: 23 %
Lymphs Abs: 1.7 10*3/uL (ref 0.7–4.0)
MCH: 32.2 pg (ref 26.0–34.0)
MCHC: 34.4 g/dL (ref 30.0–36.0)
MCV: 93.8 fL (ref 80.0–100.0)
Monocytes Absolute: 0.6 10*3/uL (ref 0.1–1.0)
Monocytes Relative: 8 %
Neutro Abs: 5.1 10*3/uL (ref 1.7–7.7)
Neutrophils Relative %: 67 %
Platelets: 346 10*3/uL (ref 150–400)
RBC: 4.16 MIL/uL (ref 3.87–5.11)
RDW: 14.4 % (ref 11.5–15.5)
WBC: 7.6 10*3/uL (ref 4.0–10.5)
nRBC: 0 % (ref 0.0–0.2)

## 2022-03-17 LAB — URINALYSIS, ROUTINE W REFLEX MICROSCOPIC
Bilirubin Urine: NEGATIVE
Glucose, UA: NEGATIVE mg/dL
Hgb urine dipstick: NEGATIVE
Ketones, ur: NEGATIVE mg/dL
Nitrite: POSITIVE — AB
Protein, ur: 30 mg/dL — AB
Specific Gravity, Urine: 1.014 (ref 1.005–1.030)
WBC, UA: >50 WBC/hpf — ABNORMAL HIGH (ref 0–5)
pH: 6 (ref 5.0–8.0)

## 2022-03-17 LAB — LACTIC ACID, PLASMA: Lactic Acid, Venous: 1.6 mmol/L (ref 0.5–1.9)

## 2022-03-17 LAB — COMPREHENSIVE METABOLIC PANEL
ALT: 15 U/L (ref 0–44)
AST: 29 U/L (ref 15–41)
Albumin: 3.7 g/dL (ref 3.5–5.0)
Alkaline Phosphatase: 75 U/L (ref 38–126)
Anion gap: 9 (ref 5–15)
BUN: 12 mg/dL (ref 8–23)
CO2: 22 mmol/L (ref 22–32)
Calcium: 9.4 mg/dL (ref 8.9–10.3)
Chloride: 107 mmol/L (ref 98–111)
Creatinine, Ser: 0.66 mg/dL (ref 0.44–1.00)
GFR, Estimated: >60 mL/min (ref >60)
Glucose, Bld: 153 mg/dL — ABNORMAL HIGH (ref 70–99)
Potassium: 3.9 mmol/L (ref 3.5–5.1)
Sodium: 138 mmol/L (ref 135–145)
Total Bilirubin: 0.6 mg/dL (ref 0.3–1.2)
Total Protein: 6.9 g/dL (ref 6.5–8.1)

## 2022-03-17 LAB — BLOOD GAS, ARTERIAL
Acid-Base Excess: 1.7 mmol/L (ref 0.0–2.0)
Bicarbonate: 23.9 mmol/L (ref 20.0–28.0)
O2 Saturation: 99.8 %
Patient temperature: 37
pCO2 arterial: 30 mmHg — ABNORMAL LOW (ref 32–48)
pH, Arterial: 7.51 — ABNORMAL HIGH (ref 7.35–7.45)
pO2, Arterial: 99 mmHg (ref 83–108)

## 2022-03-17 LAB — CBG MONITORING, ED: Glucose-Capillary: 147 mg/dL — ABNORMAL HIGH (ref 70–99)

## 2022-03-17 MED ORDER — FOSFOMYCIN TROMETHAMINE 3 G PO PACK
3.0000 g | PACK | Freq: Once | ORAL | Status: AC
  Administered 2022-03-17: 3 g via ORAL
  Filled 2022-03-17: qty 3

## 2022-03-17 MED ORDER — SODIUM CHLORIDE (PF) 0.9 % IJ SOLN
INTRAMUSCULAR | Status: AC
  Filled 2022-03-17: qty 50

## 2022-03-17 MED ORDER — PIPERACILLIN-TAZOBACTAM 3.375 G IVPB 30 MIN
3.3750 g | Freq: Once | INTRAVENOUS | Status: AC
  Administered 2022-03-17: 3.375 g via INTRAVENOUS
  Filled 2022-03-17: qty 50

## 2022-03-17 MED ORDER — SODIUM CHLORIDE 0.9 % IV SOLN: 2.0000 g | Freq: Once | INTRAVENOUS | Status: DC

## 2022-03-17 MED ORDER — IOHEXOL 300 MG/ML  SOLN
100.0000 mL | Freq: Once | INTRAMUSCULAR | Status: AC | PRN
  Administered 2022-03-17: 100 mL via INTRAVENOUS

## 2022-03-17 NOTE — ED Triage Notes (Signed)
Pt BIB EMS from home. Pts son reports generalized weakness and AMS that has progressed over the last 5 days. Pt has had recent positive test for UTI. A&O x2.   160/98 CBG 148 RR 20 HR 82

## 2022-03-17 NOTE — ED Notes (Signed)
Called lab to add on urine culture - spoke with molly who advised they will run the culture.

## 2022-03-17 NOTE — Progress Notes (Signed)
ABG completed and sent to lab 

## 2022-03-17 NOTE — Progress Notes (Signed)
A consult was received from an ED physician for Zosyn per pharmacy dosing.  The patient's profile has been reviewed for ht/wt/allergies/indication/available labs.   A one time order has been placed for Zosyn 3.375gm IV.  Further antibiotics/pharmacy consults should be ordered by admitting physician if indicated.                       Thank you, Junita Push PharmD 03/17/2022  10:21 PM

## 2022-03-17 NOTE — Discharge Instructions (Signed)
You were seen and evaluated here in the emergency department for generalized weakness and urinary tract infection Your prior cultures were reviewed and you had a Klebsiella multidrug-resistant urinary tract infection.  You are treated here in the emergency department with Zosyn and fosfomycin.  You should not require further treatment of your urinary tract infection Please follow-up with your doctor next week Return here if you are having worsening symptoms.

## 2022-03-17 NOTE — ED Provider Notes (Signed)
[name]-EMERGENCY DEPT Provider Note   CSN: [id number] Arrival date & time: 03/17/22  1517     History  Chief Complaint  Patient presents with   Weakness   Altered Mental Status    [name] is a 66 y.o. female.  HPI Level 5 caveat patient is somewhat confused History is obtained from nursing note via EMS This is a 66 year old female with reports that she has had a urinary tract infection.  Reports that this was diagnosed several days ago.  Patient has had increased weakness and altered mental status with increased confusion over the past several days.    Home Medications Prior to Admission medications   Medication Sig Start Date End Date Taking? Authorizing Provider  acetaminophen (TYLENOL) 325 MG tablet Take 2 tablets (650 mg total) by mouth every 6 (six) hours as needed for mild pain, moderate pain, fever or headache (or temp > 37.5 C (99.5 F)). Patient taking differently: Take 650 mg by mouth at bedtime. 09/22/21  Yes Elgergawy, Leana Roe, MD  amLODipine (NORVASC) 10 MG tablet Take 10 mg by mouth daily. 08/29/21  Yes [provider]  Artificial Tear Ointment (DRY EYES OP) Place 1 drop into both eyes 2 (two) times daily as needed (dry eyes).   Yes [provider]  aspirin EC 81 MG tablet Take 81 mg by mouth daily. Swallow whole.   Yes [provider]  atorvastatin (LIPITOR) 80 MG tablet Take 80 mg by mouth every evening.   Yes [provider]  cholecalciferol (VITAMIN D3) 25 MCG (1000 UNIT) tablet Take 1,000 Units by mouth daily.   Yes [provider]  clopidogrel (PLAVIX) 75 MG tablet Take 1 tablet (75 mg total) by mouth daily. 01/11/22 01/11/23 Yes Levin Erp, MD  dantrolene (DANTRIUM) 50 MG capsule Take 50 mg by mouth at bedtime. 03/02/22  Yes [provider]  docusate sodium (COLACE) 100 MG capsule Take 100 mg by mouth daily as needed for mild constipation.   Yes [provider]   feeding supplement (ENSURE ENLIVE / ENSURE PLUS) LIQD Take 237 mLs by mouth 2 (two) times daily between meals. 01/11/22  Yes Levin Erp, MD  lidocaine (LIDODERM) 5 % Place 1 patch onto the skin daily. Remove & Discard patch within 12 hours or as directed by MD 01/12/22  Yes Levin Erp, MD  Multiple Vitamins-Minerals (ZINC PO) Take 1 tablet by mouth daily.   Yes [provider]  POTASSIUM PO Take 3 tablets by mouth daily.   Yes [provider]  pantoprazole (PROTONIX) 40 MG tablet Take 1 tablet (40 mg total) by mouth 2 (two) times daily. Please take 40 mg oral twice daily x12 weeks, then take 40 mg oral once daily. Patient not taking: Reported on 03/17/2022 09/22/21   Elgergawy, Leana Roe, MD  tiZANidine (ZANAFLEX) 2 MG tablet Take 1 tablet (2 mg total) by mouth at bedtime as needed for muscle spasms. Patient not taking: Reported on 03/17/2022 01/11/22   Levin Erp, MD      Allergies    Baclofen and Nickel    Review of Systems   Review of Systems  Physical Exam Updated Vital Signs BP (!) 144/83   Pulse 89   Temp 98.4 F (36.9 C) (Oral)   Resp 16   SpO2 97%  Physical Exam Vitals and nursing note reviewed.  Constitutional:      Appearance: Normal appearance.  HENT:     Head: Normocephalic.  Comments: Abrasion to scalp    Right Ear: External ear normal.     Left Ear: External ear normal.     Nose: Nose normal.     Mouth/Throat:     Pharynx: Oropharynx is clear.  Eyes:     Pupils: Pupils are equal, round, and reactive to light.  Cardiovascular:     Rate and Rhythm: Normal rate.  Pulmonary:     Effort: Pulmonary effort is normal.     Breath sounds: Normal breath sounds.  Abdominal:     General: Abdomen is flat. There is distension.     Tenderness: There is no abdominal tenderness.  Musculoskeletal:     Cervical back: Normal range of motion.     Comments: Decreased range of motion bilateral lower extremities and right upper extremity with  what appears to be some chronic contractures  Skin:    General: Skin is warm and dry.     Coloration: Skin is pale.  Neurological:     Mental Status: She is alert.     Comments: Patient is not oriented to year or definite hospital He is able to tell me her name She has decreased movement bilateral lower extremities and contracture right upper extremity     ED Results / Procedures / Treatments   Labs (all labs ordered are listed, but only abnormal results are displayed) Labs Reviewed  COMPREHENSIVE METABOLIC PANEL - Abnormal; Notable for the following components:      Result Value   Glucose, Bld 153 (*)    All other components within normal limits  BLOOD GAS, ARTERIAL - Abnormal; Notable for the following components:   pH, Arterial 7.51 (*)    pCO2 arterial 30 (*)    All other components within normal limits  URINALYSIS, ROUTINE W REFLEX MICROSCOPIC - Abnormal; Notable for the following components:   APPearance CLOUDY (*)    Protein, ur 30 (*)    Nitrite POSITIVE (*)    Leukocytes,Ua LARGE (*)    WBC, UA >50 (*)    Bacteria, UA FEW (*)    All other components within normal limits  CBG MONITORING, ED - Abnormal; Notable for the following components:   Glucose-Capillary 147 (*)    All other components within normal limits  CULTURE, BLOOD (ROUTINE X 2)  CULTURE, BLOOD (ROUTINE X 2)  URINE CULTURE  CBC WITH DIFFERENTIAL/PLATELET  LACTIC ACID, PLASMA  LACTIC ACID, PLASMA    EKG None  Radiology CT ABDOMEN PELVIS W CONTRAST  Result Date: 03/17/2022 CLINICAL DATA:  Abdominal pain, acute, nonlocalized. Recent history of urinary tract infection. EXAM: CT ABDOMEN AND PELVIS WITH CONTRAST TECHNIQUE: Multidetector CT imaging of the abdomen and pelvis was performed using the standard protocol following bolus administration of intravenous contrast. RADIATION DOSE REDUCTION: This exam was performed according to the departmental dose-optimization program which includes automated  exposure control, adjustment of the mA and/or kV according to patient size and/or use of iterative reconstruction technique. CONTRAST:  OMNIPAQUE IOHEXOL 300 MG/ML  SOLN COMPARISON:  CT examination dated Jan 06, 2022 FINDINGS: Lower chest: No acute abnormality. Right basilar atelectasis, unchanged. Hepatobiliary: No focal liver abnormality is seen. Status post cholecystectomy. No biliary dilatation. Pancreas: Moderate fatty infiltration of the pancreas. No pancreatic ductal dilatation or surrounding inflammatory changes. Spleen: Normal in size without focal abnormality. Adrenals/Urinary Tract: Adrenal glands are unremarkable. Mild bilateral renal cortical atrophy. No evidence of nephrolithiasis or hydronephrosis. Bladder is unremarkable. Stomach/Bowel: Stomach is within normal limits. Appendix not identified. Scattered colonic  diverticuli without evidence of acute diverticulitis. Vascular/Lymphatic: Aortic atherosclerosis. No enlarged abdominal or pelvic lymph nodes. Reproductive: Uterus and bilateral adnexa are unremarkable. Other: No abdominal wall hernia or abnormality. No abdominopelvic ascites. Musculoskeletal: Osteopenia. Compression deformities of the T12 and L1 vertebral bodies, unchanged. No acute osseous abnormality. IMPRESSION: 1.  No CT evidence of acute abdominal/pelvic process. 2.  Moderate pancreatic atrophy. 3. Moderate atherosclerotic disease of abdominal aorta and branch vessels. 4. Scattered colonic diverticuli without evidence of acute diverticulitis. 5. Diffuse osteopenia and chronic compression deformities of T12 and L1 vertebral bodies. No acute osseous abnormality. Electronically Signed   By: Larose Hires D.O.   On: 03/17/2022 21:07   CT Head Wo Contrast  Result Date: 03/17/2022 CLINICAL DATA:  Altered mental status. EXAM: CT HEAD WITHOUT CONTRAST TECHNIQUE: Contiguous axial images were obtained from the base of the skull through the vertex without intravenous contrast. RADIATION  DOSE REDUCTION: This exam was performed according to the departmental dose-optimization program which includes automated exposure control, adjustment of the mA and/or kV according to patient size and/or use of iterative reconstruction technique. COMPARISON:  09/20/2021 FINDINGS: Brain: No evidence of acute infarction, hemorrhage, hydrocephalus, extra-axial collection or mass lesion/mass effect. Extensive bilateral periventricular and deep white matter hypodensity, with multifocal bilateral encephalomalacia some foci in the posterior hemispheres seen acutely on examination dated 09/20/2021. Vascular: No hyperdense vessel or unexpected calcification. Skull: Normal. Negative for fracture or focal lesion. Sinuses/Orbits: No acute finding. Other: None. IMPRESSION: 1. No acute intracranial pathology. 2. Extensive bilateral periventricular and deep white matter hypodensity, with multifocal bilateral encephalomalacia. Electronically Signed   By: Jearld Lesch M.D.   On: 03/17/2022 17:06   DG Chest Port 1 View  Result Date: 03/17/2022 CLINICAL DATA:  Altered mental status EXAM: PORTABLE CHEST 1 VIEW COMPARISON:  01/06/2022 FINDINGS: The heart size and mediastinal contours are within normal limits. Both lungs are clear. The visualized skeletal structures are unremarkable. IMPRESSION: No acute abnormality of the lungs in AP portable projection. Electronically Signed   By: Jearld Lesch M.D.   On: 03/17/2022 15:55    Procedures Procedures    Medications Ordered in ED Medications  fosfomycin (MONUROL) packet 3 g (has no administration in time range)  sodium chloride (PF) 0.9 % injection (  Given by Other 03/17/22 2045)  iohexol (OMNIPAQUE) 300 MG/ML solution 100 mL (100 mLs Intravenous Contrast Given 03/17/22 2038)    ED Course/ Medical Decision Making/ A&P Clinical Course as of 03/17/22 2205  Sat Mar 17, 2022  2044 CT head reviewed interpreted no evidence of acute bleeding, radiologist interpretation reviewed  and significant for no acute intracranial pathology however extensive bilateral periventricular and deep white matter hypodensities noted [DR]  2045 CBC obtained reviewed interpreted within normal limit [DR]  2046 Complete metabolic panel reviewed interpreted and significant for mild hyperglycemia otherwise within normal limits [DR]  2046 Lactic acid obtained reviewed interpreted normal at 1.6 [DR]  2202 ET abdomen reviewed and interpreted no evidence of acute intra-abdominal abnormality noted [DR]    Clinical Course User Index [DR] Margarita Grizzle, MD                           Medical Decision Making 67 year old female with history of diabetes, stroke, CKD, presents today with report of UTI and altered mental status. Patient is awake and alert here in the ED Labs obtained and patient evaluated for signs symptoms of infection, CT of head obtained  Reevaluated patient.  She endorses that she has recently been on some new antibiotics for UTI and notes that she had a urinary tract back infection back in May.  She is feeling somewhat improved from prior. Reviewed the salts of the urinalysis and the may urine culture with pharmacist. Patient previously had multidrug-resistant Klebsiella UTI that was susceptible to Zosyn and imipenem. She is given Zosyn here in the department and then will be given a dose of fosfomycin.  She appears stable to be discharged home.   Amount and/or Complexity of Data Reviewed Labs: ordered. Decision-making details documented in ED Course. Radiology: ordered and independent interpretation performed. Decision-making details documented in ED Course.  Risk Prescription drug management. Risk Details: Attempted to call spouse and did not answer            Final Clinical Impression(s) / ED Diagnoses Final diagnoses:  Urinary tract infection without hematuria, site unspecified  Weakness    Rx / DC Orders ED Discharge Orders     None         Margarita Grizzle, MD 03/17/22 2205

## 2022-03-18 NOTE — ED Notes (Signed)
Attempted to call husband and son for DC P/U per contact info in chart to no answer. Will follow up.

## 2022-03-18 NOTE — ED Notes (Signed)
Patients son upset because this RN called him and woke him up from his sleep asking him to please come pick up his mother, as she is discharged. He said we keep "calling his phone" and he will be here at 6am.

## 2022-03-18 NOTE — Progress Notes (Signed)
CSW spoke with Bevin, Das (Spouse) [phone] [facility]). Patients husband is on his way to pick his wife up.

## 2022-03-20 LAB — URINE CULTURE: Culture: >=100000 — AB

## 2022-03-21 ENCOUNTER — Telehealth: Payer: Self-pay | Admitting: Emergency Medicine

## 2022-03-21 NOTE — Telephone Encounter (Signed)
Post ED Visit - Positive Culture Follow-up  Culture report reviewed by antimicrobial stewardship pharmacist: [facility] []  Nathan Batchelder, Pharm.D. []  , Pharm.D., BCPS AQ-ID []  , Pharm.D., BCPS [x]  Celedonio Miyamoto, Pharm.D., BCPS []  [place], Garvin Fila.D., BCPS, AAHIVP []  , Pharm.D., BCPS, AAHIVP []  Georgina Pillion, PharmD, BCPS []  , PharmD, BCPS []  [place], PharmD, BCPS []  [address], PharmD []  , PharmD, BCPS []  Estella Husk, PharmD  Pharmacy Team []  Lysle Pearl, PharmD []  , PharmD []  Phillips Climes, PharmD []  , Rph []  Agapito Games) , PharmD []  Verlan Friends, PharmD []  , PharmD []  Mervyn Gay, PharmD []  , PharmD []  Vinnie Level, PharmD []  [facility], PharmD []  , PharmD []  Len Childs, PharmD   Positive urine culture Treated with fosfomycin, organism sensitive to the same and no further patient follow-up is required at this time.  03/21/2022, 8:44 AM

## 2022-03-22 ENCOUNTER — Encounter: Payer: Self-pay | Admitting: Neurology

## 2022-03-22 ENCOUNTER — Inpatient Hospital Stay: Payer: Medicare Other | Admitting: Neurology

## 2022-03-22 LAB — CULTURE, BLOOD (ROUTINE X 2)
Culture: NO GROWTH
Culture: NO GROWTH

## 2022-04-06 ENCOUNTER — Inpatient Hospital Stay (HOSPITAL_COMMUNITY)
Admission: EM | Admit: 2022-04-06 | Discharge: 2022-04-11 | DRG: 690 | Disposition: A | Discharge status: Skilled Nursing Facility | Payer: Medicare Other | Attending: Internal Medicine | Admitting: Internal Medicine

## 2022-04-06 ENCOUNTER — Emergency Department (HOSPITAL_COMMUNITY): Payer: Medicare Other

## 2022-04-06 ENCOUNTER — Other Ambulatory Visit: Payer: Self-pay

## 2022-04-06 ENCOUNTER — Encounter (HOSPITAL_COMMUNITY): Payer: Self-pay | Admitting: Emergency Medicine

## 2022-04-06 DIAGNOSIS — R319 Hematuria, unspecified: Secondary | ICD-10-CM | POA: Diagnosis present

## 2022-04-06 DIAGNOSIS — M797 Fibromyalgia: Secondary | ICD-10-CM | POA: Diagnosis present

## 2022-04-06 DIAGNOSIS — Z79899 Other long term (current) drug therapy: Secondary | ICD-10-CM

## 2022-04-06 DIAGNOSIS — I129 Hypertensive chronic kidney disease with stage 1 through stage 4 chronic kidney disease, or unspecified chronic kidney disease: Secondary | ICD-10-CM | POA: Diagnosis present

## 2022-04-06 DIAGNOSIS — Z7902 Long term (current) use of antithrombotics/antiplatelets: Secondary | ICD-10-CM

## 2022-04-06 DIAGNOSIS — Z888 Allergy status to other drugs, medicaments and biological substances status: Secondary | ICD-10-CM

## 2022-04-06 DIAGNOSIS — R55 Syncope and collapse: Secondary | ICD-10-CM | POA: Diagnosis not present

## 2022-04-06 DIAGNOSIS — N390 Urinary tract infection, site not specified: Principal | ICD-10-CM | POA: Diagnosis present

## 2022-04-06 DIAGNOSIS — N1831 Chronic kidney disease, stage 3a: Secondary | ICD-10-CM | POA: Diagnosis present

## 2022-04-06 DIAGNOSIS — L299 Pruritus, unspecified: Secondary | ICD-10-CM | POA: Diagnosis present

## 2022-04-06 DIAGNOSIS — Z87440 Personal history of urinary (tract) infections: Secondary | ICD-10-CM

## 2022-04-06 DIAGNOSIS — I69351 Hemiplegia and hemiparesis following cerebral infarction affecting right dominant side: Secondary | ICD-10-CM

## 2022-04-06 DIAGNOSIS — G4733 Obstructive sleep apnea (adult) (pediatric): Secondary | ICD-10-CM | POA: Diagnosis present

## 2022-04-06 DIAGNOSIS — Z87891 Personal history of nicotine dependence: Secondary | ICD-10-CM

## 2022-04-06 DIAGNOSIS — E785 Hyperlipidemia, unspecified: Secondary | ICD-10-CM | POA: Diagnosis present

## 2022-04-06 DIAGNOSIS — B961 Klebsiella pneumoniae [K. pneumoniae] as the cause of diseases classified elsewhere: Secondary | ICD-10-CM | POA: Diagnosis present

## 2022-04-06 DIAGNOSIS — E1122 Type 2 diabetes mellitus with diabetic chronic kidney disease: Secondary | ICD-10-CM | POA: Diagnosis present

## 2022-04-06 DIAGNOSIS — Z66 Do not resuscitate: Secondary | ICD-10-CM | POA: Diagnosis present

## 2022-04-06 DIAGNOSIS — Z7982 Long term (current) use of aspirin: Secondary | ICD-10-CM

## 2022-04-06 DIAGNOSIS — K5900 Constipation, unspecified: Secondary | ICD-10-CM

## 2022-04-06 DIAGNOSIS — N3000 Acute cystitis without hematuria: Secondary | ICD-10-CM

## 2022-04-06 DIAGNOSIS — J449 Chronic obstructive pulmonary disease, unspecified: Secondary | ICD-10-CM | POA: Diagnosis present

## 2022-04-06 DIAGNOSIS — M159 Polyosteoarthritis, unspecified: Secondary | ICD-10-CM | POA: Diagnosis present

## 2022-04-06 DIAGNOSIS — Z91048 Other nonmedicinal substance allergy status: Secondary | ICD-10-CM

## 2022-04-06 DIAGNOSIS — E1149 Type 2 diabetes mellitus with other diabetic neurological complication: Secondary | ICD-10-CM | POA: Diagnosis present

## 2022-04-06 DIAGNOSIS — E876 Hypokalemia: Secondary | ICD-10-CM | POA: Diagnosis not present

## 2022-04-06 DIAGNOSIS — I10 Essential (primary) hypertension: Secondary | ICD-10-CM | POA: Diagnosis present

## 2022-04-06 DIAGNOSIS — I444 Left anterior fascicular block: Secondary | ICD-10-CM | POA: Diagnosis present

## 2022-04-06 LAB — COMPREHENSIVE METABOLIC PANEL
ALT: 16 U/L (ref 0–44)
AST: 25 U/L (ref 15–41)
Albumin: 4.1 g/dL (ref 3.5–5.0)
Alkaline Phosphatase: 79 U/L (ref 38–126)
Anion gap: 10 (ref 5–15)
BUN: 21 mg/dL (ref 8–23)
CO2: 21 mmol/L — ABNORMAL LOW (ref 22–32)
Calcium: 9.8 mg/dL (ref 8.9–10.3)
Chloride: 108 mmol/L (ref 98–111)
Creatinine, Ser: 0.97 mg/dL (ref 0.44–1.00)
GFR, Estimated: >60 mL/min (ref >60)
Glucose, Bld: 168 mg/dL — ABNORMAL HIGH (ref 70–99)
Potassium: 4 mmol/L (ref 3.5–5.1)
Sodium: 139 mmol/L (ref 135–145)
Total Bilirubin: 0.8 mg/dL (ref 0.3–1.2)
Total Protein: 7.2 g/dL (ref 6.5–8.1)

## 2022-04-06 LAB — CBC WITH DIFFERENTIAL/PLATELET
Abs Immature Granulocytes: 0.04 10*3/uL (ref 0.00–0.07)
Basophils Absolute: 0.1 10*3/uL (ref 0.0–0.1)
Basophils Relative: 1 %
Eosinophils Absolute: 0.1 10*3/uL (ref 0.0–0.5)
Eosinophils Relative: 1 %
HCT: 44.5 % (ref 36.0–46.0)
Hemoglobin: 14.9 g/dL (ref 12.0–15.0)
Immature Granulocytes: 0 %
Lymphocytes Relative: 13 %
Lymphs Abs: 1.7 10*3/uL (ref 0.7–4.0)
MCH: 32.2 pg (ref 26.0–34.0)
MCHC: 33.5 g/dL (ref 30.0–36.0)
MCV: 96.1 fL (ref 80.0–100.0)
Monocytes Absolute: 0.9 10*3/uL (ref 0.1–1.0)
Monocytes Relative: 7 %
Neutro Abs: 10.4 10*3/uL — ABNORMAL HIGH (ref 1.7–7.7)
Neutrophils Relative %: 78 %
Platelets: 391 10*3/uL (ref 150–400)
RBC: 4.63 MIL/uL (ref 3.87–5.11)
RDW: 14.1 % (ref 11.5–15.5)
WBC: 13.2 10*3/uL — ABNORMAL HIGH (ref 4.0–10.5)
nRBC: 0 % (ref 0.0–0.2)

## 2022-04-06 LAB — CBG MONITORING, ED: Glucose-Capillary: 195 mg/dL — ABNORMAL HIGH (ref 70–99)

## 2022-04-06 LAB — LIPASE, BLOOD: Lipase: 29 U/L (ref 11–51)

## 2022-04-06 NOTE — ED Triage Notes (Signed)
Patient arrived with EMS from home reports constipation for 4 days and UTI with dark/concentrated urine this week .

## 2022-04-06 NOTE — ED Provider Triage Note (Cosign Needed Addendum)
Emergency Medicine Provider Triage Evaluation Note  [name] , a 66 y.o. female  was evaluated in triage.  Pt complains of abdominal pain.  Son dropped her off today due to difficulty moving her bowels and concern for UTI. She has a hx of recurrent UTI's.  Denies fever/chills.  She admits to getting confused easily since prior CVA but feels at baseline currently.  Review of Systems  Positive: Abdominal pain, constipation, uti sx Negative: fever  Physical Exam  BP (!) 155/75   Pulse (!) 111   Temp 98 F (36.7 C) (Oral)   Resp 18   SpO2 98%   Gen:   Awake, no distress   Resp:  Normal effort  MSK:   Moves extremities without difficulty  Other:  Abdomen soft, non-tender on exam  Medical Decision Making  Medically screening exam initiated at 10:24 PM.  Appropriate orders placed.  [name] was informed that the remainder of the evaluation will be completed by another provider, this initial triage assessment does not replace that evaluation, and the importance of remaining in the ED until their evaluation is complete.  Abdominal pain-- difficulty moving bowels and concern for UTI.  Hx of same.  Abdomen soft, non-tender on exam.  Will check labs, UA w/culture, acute abd series.  11:02 PM Patient moved from [facility] to recliner while in triage for comfort due to her deficits, became pale, diaphoretic and had near syncopal event.  BP re-checked 150/95, HR 95.  CBG 195.  Will add on EKG as well.  Moved in front of triage staff for close monitoring until room available.  Charge RN awake, expedite room assignment.   Garlon Hatchet, PA-C 04/06/22 2227    Garlon Hatchet, PA-C 04/07/22 [phone]

## 2022-04-06 NOTE — ED Provider Triage Note (Incomplete)
Emergency Medicine Provider Triage Evaluation Note  [name] , a 66 y.o. female  was evaluated in triage.  Pt complains of abdominal pain.  Son dropped her off today due to difficulty moving her bowels and concern for UTI. She has a hx of recurrent UTI's.  Denies fever/chills.  She admits to getting confused easily since prior CVA but feels at baseline currently.  Review of Systems  Positive: Abdominal pain, constipation, uti sx Negative: fever  Physical Exam  BP (!) 155/75   Pulse (!) 111   Temp 98 F (36.7 C) (Oral)   Resp 18   SpO2 98%   Gen:   Awake, no distress   Resp:  Normal effort  MSK:   Moves extremities without difficulty  Other:  Abdomen soft, non-tender on exam  Medical Decision Making  Medically screening exam initiated at 10:24 PM.  Appropriate orders placed.  [name] was informed that the remainder of the evaluation will be completed by another provider, this initial triage assessment does not replace that evaluation, and the importance of remaining in the ED until their evaluation is complete.  Abdominal pain-- difficulty moving bowels and concern for UTI.  Hx of same.  Abdomen soft, non-tender on exam.  Will check labs, UA w/culture, acute abd series.  11:02 PM Patient moved from [name] to recliner while in triage for comfort due to her deficits, became pale, diaphoretic and had near syncopal event.  BP re-checked 150/95, HR 95.  CBG 195.  Will add on EKG as well.  Moved in front of triage staff for close monitoring until room available.   Jade Hatchet, PA-C 04/06/22 2227

## 2022-04-07 ENCOUNTER — Emergency Department (HOSPITAL_COMMUNITY): Payer: Medicare Other

## 2022-04-07 ENCOUNTER — Encounter (HOSPITAL_COMMUNITY): Payer: Self-pay

## 2022-04-07 DIAGNOSIS — N390 Urinary tract infection, site not specified: Secondary | ICD-10-CM | POA: Diagnosis present

## 2022-04-07 LAB — URINALYSIS, ROUTINE W REFLEX MICROSCOPIC
Bilirubin Urine: NEGATIVE
Glucose, UA: NEGATIVE mg/dL
Hgb urine dipstick: NEGATIVE
Ketones, ur: NEGATIVE mg/dL
Nitrite: POSITIVE — AB
Protein, ur: NEGATIVE mg/dL
Specific Gravity, Urine: 1.015 (ref 1.005–1.030)
WBC, UA: >50 WBC/hpf — ABNORMAL HIGH (ref 0–5)
pH: 6 (ref 5.0–8.0)

## 2022-04-07 LAB — LACTIC ACID, PLASMA
Lactic Acid, Venous: 0.9 mmol/L (ref 0.5–1.9)
Lactic Acid, Venous: 1.3 mmol/L (ref 0.5–1.9)

## 2022-04-07 MED ORDER — PIPERACILLIN-TAZOBACTAM 3.375 G IVPB 30 MIN
3.3750 g | Freq: Once | INTRAVENOUS | Status: AC
  Administered 2022-04-07: 3.375 g via INTRAVENOUS
  Filled 2022-04-07: qty 50

## 2022-04-07 MED ORDER — SODIUM CHLORIDE 0.9% FLUSH: 10.0000 mL | INTRAVENOUS | Status: DC | PRN

## 2022-04-07 MED ORDER — AMLODIPINE BESYLATE 10 MG PO TABS
10.0000 mg | ORAL_TABLET | Freq: Every day | ORAL | Status: DC
  Administered 2022-04-07 – 2022-04-11 (×5): 10 mg via ORAL
  Filled 2022-04-07 (×5): qty 1

## 2022-04-07 MED ORDER — CALAMINE EX LOTN
1.0000 | TOPICAL_LOTION | CUTANEOUS | Status: DC | PRN
  Filled 2022-04-07: qty 177

## 2022-04-07 MED ORDER — PIPERACILLIN-TAZOBACTAM 3.375 G IVPB
3.3750 g | Freq: Three times a day (TID) | INTRAVENOUS | Status: DC
  Administered 2022-04-07 – 2022-04-10 (×8): 3.375 g via INTRAVENOUS
  Filled 2022-04-07 (×9): qty 50

## 2022-04-07 MED ORDER — CLOPIDOGREL BISULFATE 75 MG PO TABS
75.0000 mg | ORAL_TABLET | Freq: Every day | ORAL | Status: DC
  Administered 2022-04-07 – 2022-04-11 (×5): 75 mg via ORAL
  Filled 2022-04-07 (×5): qty 1

## 2022-04-07 MED ORDER — TIZANIDINE HCL 4 MG PO TABS
2.0000 mg | ORAL_TABLET | Freq: Every evening | ORAL | Status: DC | PRN
  Administered 2022-04-09: 2 mg via ORAL
  Filled 2022-04-07 (×2): qty 1

## 2022-04-07 MED ORDER — RIVAROXABAN 10 MG PO TABS
10.0000 mg | ORAL_TABLET | Freq: Every day | ORAL | Status: DC
  Administered 2022-04-07 – 2022-04-10 (×4): 10 mg via ORAL
  Filled 2022-04-07 (×5): qty 1

## 2022-04-07 MED ORDER — DANTROLENE SODIUM 25 MG PO CAPS
50.0000 mg | ORAL_CAPSULE | Freq: Every day | ORAL | Status: DC
  Administered 2022-04-07: 50 mg via ORAL
  Filled 2022-04-07: qty 2

## 2022-04-07 MED ORDER — ENSURE ENLIVE PO LIQD
237.0000 mL | Freq: Two times a day (BID) | ORAL | Status: DC
  Administered 2022-04-08 – 2022-04-09 (×3): 237 mL via ORAL

## 2022-04-07 MED ORDER — ASPIRIN 81 MG PO TBEC
81.0000 mg | DELAYED_RELEASE_TABLET | Freq: Every day | ORAL | Status: DC
  Administered 2022-04-07 – 2022-04-11 (×5): 81 mg via ORAL
  Filled 2022-04-07 (×5): qty 1

## 2022-04-07 MED ORDER — POLYETHYLENE GLYCOL 3350 17 G PO PACK: 17.0000 g | PACK | Freq: Every day | ORAL | Status: DC | PRN

## 2022-04-07 MED ORDER — ATORVASTATIN CALCIUM 80 MG PO TABS
80.0000 mg | ORAL_TABLET | Freq: Every evening | ORAL | Status: DC
  Administered 2022-04-07 – 2022-04-10 (×4): 80 mg via ORAL
  Filled 2022-04-07 (×4): qty 1

## 2022-04-07 MED ORDER — SORBITOL 70 % SOLN
960.0000 mL | TOPICAL_OIL | Freq: Once | ORAL | Status: AC
  Administered 2022-04-07: 960 mL via RECTAL
  Filled 2022-04-07: qty 473

## 2022-04-07 NOTE — ED Notes (Signed)
ED provider at bedside at this time 

## 2022-04-07 NOTE — ED Provider Notes (Signed)
7:18 AM Care assumed Dr. Rubin Payor.  At time of transfer of care, patient is awaiting urinalysis and enema administration and reassessment.  Patient had x-ray that did not show evidence of bowel obstruction and x-ray and labs appeared similar to prior per previous team.  If work-up is reassuring and she is improving, plan of care to discharge home.  Given the lack of severe abdominal tenderness or pain, plan was to hold on CT imaging at this time.  11:15 AM Urinalysis returned and it does show evidence of urinary tract infection.  I spoke with pharmacy and we reviewed the chart and cultures.  Unfortunately, patient's UTI is very resistant and despite her getting fosfomycin last time, it does not appear there is good outpatient treatment for her.  They recommended Zosyn and admission and patient agreed.  Patient does have elevated white count she did not have last time and was tachycardic on arrival.  She is still having some mild lower abdominal discomfort so I am concerned that this is more worsening UTI.  Of note, patient did have a bowel movement after the enema and nurse reports they got a large amount of stool out.  We will call for admission for treatment of UTI that is resistant to outpatient therapy.  Clinical Impression: 1. Constipation, unspecified constipation type   2. Acute cystitis without hematuria     Disposition: Admit  This note was prepared with assistance of Dragon voice recognition software. Occasional wrong-word or sound-a-like substitutions may have occurred due to the inherent limitations of voice recognition software.       Bonnie Roig, Canary Brim, MD 04/07/22 [phone]

## 2022-04-07 NOTE — ED Notes (Signed)
IV team at bedside to place midline due to difficulty of access

## 2022-04-07 NOTE — Progress Notes (Signed)
Pharmacy Antibiotic Note  [name] is a 66 y.o. female for which pharmacy has been consulted for zosyn dosing for UTI.  Patient with a history of CKD, COPD, CVA, fibromyalgia, gout, HLD, HTN, migraine, OSA, pna, T2DM. Recently seen in ED and given fosfomycin for UTI. Patient presenting with constipation and acute cystitis.  7/22 UCx w/ Klebsiella - imipenem and zosyn S 5/14 UCx w/ same as above  SCr 0.97 WBC 13.2; T 98 F; HR 111>77; RR 18>>15  Plan: D/w ED MD [place] treatment options. Patient just received fosfomycin recently and w/ recurrent UTI. Discussed that zosyn susceptibility has high probability of being lost with continued use but conservation of carbapenem for treatment may be beneficial for long-term management of patient so long that the patient is not severely ill at this time.   Recommend close monitoring of response to zosyn and to have a low-threshold to adjust abx if improvement not seen or if clinically worsening  START Zosyn 3.375g IV q8h (4 hour infusion) Trend WBC, Fever, Renal function, & Clinical course F/u cultures, clinical course, WBC, fever  Height: 5\' 2"  (157.5 cm) Weight: 78 kg (172 lb) IBW/kg (Calculated) : 50.1  Temp (24hrs), Avg:98.4 F (36.9 C), Min:98 F (36.7 C), Max:98.6 F (37 C)  Recent Labs  Lab 04/06/22 2252  WBC 13.2*  CREATININE 0.97    Estimated Creatinine Clearance: 54.5 mL/min (by C-G formula based on SCr of 0.97 mg/dL).    Allergies  Allergen Reactions   Baclofen Other (See Comments)    Pt said felt weaker and had slurred speech   Nickel Dermatitis and Rash    Antimicrobials this admission: zosyn 8/12 >>   Microbiology results: Pending  Thank you for allowing pharmacy to be a part of this patient's care.  10/12, PharmD, BCPS 04/07/2022 11:32 AM ED Clinical Pharmacist -  [phone]

## 2022-04-07 NOTE — H&P (Signed)
Date: 04/07/2022               Patient Name:  Jade [name] MRN: [id number]  DOB: [birth year] Age / Sex: 66 y.o., female   PCP: Nathaneil Canary, PA-C         Medical Service: Internal Medicine Teaching Service         Attending Physician: Dr. Mercie Eon, MD    First Contact: Dr. Rocky Morel, DO Pager: [phone]  Second Contact: Dr. Thalia Bloodgood Pager: [phone]       After Hours (After 5p/  First Contact Pager: [phone]  weekends / holidays): Second Contact Pager: [phone]   Chief Complaint: Abdominal pain  History of Present Illness: Jade [name] is a 66 year old female with a past medical history including T2DM with neuropathy, CVA with residual right-sided weakness, COPD, stage IIIa CKD, HTN, and HLD who presents with lower abdominal pain and hematuria with persistent Klebsiella pneumonia UTI.  In May 2023 she was diagnosed with Klebsiella pneumonia UTI that was sensitive to imipenem and Zosyn.  When she first entered in May she was having fevers, lower abdominal pain, hematuria, nausea, and vomiting.  She has only been having malaise and some lower abdominal pain recently with remittent episodes of flank/back pain and intermittent hematuria but otherwise felt better than she did before initial presentation in May.  She states that sometimes she will go over a day without urinating.  She also notes occasional chest pain that feels like someone sitting on her chest when she is resting, she has had this feeling before she does not know why it happens.  She has not had anything that makes it better or worse.  She is history of multiple strokes and has she is with balance after this denies any recent falls.    She states she has poor memory and she also does not want to answer many questions and was asked to look in her chart and/or ask her son about her history.  Meds:  Plavix Atorvastatin Amlodipine Asa 81 mg Tizanidine  Dantrolene Tylenol Vit  C Potassium  Current Meds  Medication Sig   acetaminophen (TYLENOL) 325 MG tablet Take 2 tablets (650 mg total) by mouth every 6 (six) hours as needed for mild pain, moderate pain, fever or headache (or temp > 37.5 C (99.5 F)). (Patient taking differently: Take 650 mg by mouth at bedtime.)   amLODipine (NORVASC) 10 MG tablet Take 10 mg by mouth daily.   aspirin EC 81 MG tablet Take 81 mg by mouth daily. Swallow whole.   atorvastatin (LIPITOR) 80 MG tablet Take 80 mg by mouth every evening.   cholecalciferol (VITAMIN D3) 25 MCG (1000 UNIT) tablet Take 1,000 Units by mouth daily.   clopidogrel (PLAVIX) 75 MG tablet Take 1 tablet (75 mg total) by mouth daily.   dantrolene (DANTRIUM) 50 MG capsule Take 50 mg by mouth at bedtime.   docusate sodium (COLACE) 100 MG capsule Take 100 mg by mouth daily.   feeding supplement (ENSURE ENLIVE / ENSURE PLUS) LIQD Take 237 mLs by mouth 2 (two) times daily between meals.   Multiple Vitamins-Minerals (ZINC PO) Take 1 tablet by mouth daily.   pantoprazole (PROTONIX) 40 MG tablet Take 1 tablet (40 mg total) by mouth 2 (two) times daily. Please take 40 mg oral twice daily x12 weeks, then take 40 mg oral once daily.   POTASSIUM PO Take 3 tablets by mouth daily.   tiZANidine (ZANAFLEX) 2 MG tablet  Take 1 tablet (2 mg total) by mouth at bedtime as needed for muscle spasms.     Allergies: Allergies as of 04/06/2022 - Review Complete 03/17/2022  Allergen Reaction Noted   Baclofen Other (See Comments) 04/23/2021   Nickel Dermatitis and Rash 03/21/2018   Past Medical History:  Diagnosis Date   Arthritis    "back, knees, some in my left hip" (03/21/2018)   Chronic kidney disease    "was told I had 25% kidney function in early 2012" (03/21/2018)   COPD (chronic obstructive pulmonary disease) (HCC)    CVA (cerebral vascular accident) ([place]) 03/21/2018   "numb all over; head to toe" (03/21/2018)   Fibromyalgia    History of gout    Hyperlipidemia    Hypertension     Migraine    "used to get them monthly during menopause" (03/21/2018)   Neuromuscular disorder ([place])    OSA (obstructive sleep apnea)    "don't tolerate the machine" (03/21/2018)   Pneumonia ~ 2007   Type 2 diabetes, diet controlled ([place])     Family History:  None noted  Social History:  Lives at home with husband Unable to perform ADL/IADL's without assistance Tobacco use - less than 1/2 ppd, quit 20 years ago. Unsure how long she did that for, but less than 10 years.  Occasionla alcohol use. Denies daily alcohol consumption No other substance use.    Review of Systems: A complete ROS was negative except as per HPI.   Physical Exam: Blood pressure (!) 150/76, pulse 78, temperature 98.9 F (37.2 C), resp. rate 17, height 5\' 2"  (1.575 m), weight 78 kg, SpO2 99 %. Constitutional: Elderly female laying in bed in no acute distress HENT: normocephalic atraumatic Cardiovascular: regular rate and rhythm Pulmonary/Chest: normal work of breathing on room air, exam limited by patient refusal MSK: No lower extremity edema, patient with gloves on Neurological: alert & oriented x 3 Skin: warm and dry Psych: Flat affect  EKG: personally reviewed my interpretation is rate of 80, regular, normal sinus, left bundle branch block  Assessment & Plan by Problem: Principal Problem:   Urinary tract infection  Summary Jade [name] is a 66 year old female with a past medical history including T2DM with neuropathy, CVA with residual right-sided weakness, COPD, stage IIIa CKD, HTN, and HLD who presents with lower abdominal pain and hematuria was admitted for suspected persistent Klebsiella pneumonia UTI.  UTI Likely recurrent Klebsiella pneumonia UTI Lower abdominal pain/hematuria Was admitted to [facility] in May of this year and had a positive urine culture with Klebsiella pneumoniae that was only sensitive to imipenem and Zosyn.  Patient was treated with 5 days of ceftriaxone prior to  culture results and then treated with no more antibiotics during that admission.  She then went to [facility] long emergency room 2 weeks ago for current symptoms and had a urine culture that was consistent with prior.  She was treated with Zosyn and discharged with fosfomycin but per her son they were not able to pick this up from the pharmacy because it was not there.  She now presents again with this same issue.  Her urinalysis shows large amount of leukocytes and positive for nitrites with bacteria and white cells present.  Leukocytosis at 13.2.  Lactic acid 0.9 and 1.3.  No AKI. - Urine and blood cultures pending - Zosyn 3.375 g q8h, day 1/10, stop date 8/21  COPD Quit smoking over 20 years ago and not on any home O2.  We will continue  to monitor respiratory status.  Hypertension Hyperlipidemia Continue home regimen of amlodipine 10 mg daily, aspirin 81 mg daily, Plavix 75 mg daily  T2DM A1c of 5.7 in 1 of 2023.  Currently not on any diabetic medications.  Chronic Pruritis Flat Affect Patient denies any use of psych drugs in the past but she is wearing gloves during the interview and states that these are so she does no scratch herself. - We will trial calamine lotion.  Dispo: Admit patient to Inpatient with expected length of stay greater than 2 midnights.  Diet: Normal IVF: None,None VTE: DOAC Code: DNR PT/OT recs: Pending TOC recs: Pending Family Update: Called and talked to son tonight about patient's history and medications.  He was notified that she is in the hospital and I plan to treat her with antibiotics.  Signed: Rocky Morel, DO 04/07/2022, 3:49 PM  Pager: [phone] After 5pm on weekdays and 1pm on weekends: On Call pager: [phone]

## 2022-04-07 NOTE — ED Provider Notes (Signed)
[facility] EMERGENCY DEPARTMENT Provider Note   CSN: [id number] Arrival date & time: 04/06/22  2038     History  Chief Complaint  Patient presents with   UTI   Constipation     Jade [name] is a 66 y.o. female.  HPI Patient brought in for some constipation.  Reportedly has not had a bowel movement somewhere between 2 and 4 5 days.  States that is not that unusual for her.  May have some mild abdominal pain with it.  Son was also worried could have UTI.  Son is not here now.  Patient states she does not feel confused.   Past Medical History:  Diagnosis Date   Arthritis    "back, knees, some in my left hip" (03/21/2018)   Chronic kidney disease    "was told I had 25% kidney function in early 2012" (03/21/2018)   COPD (chronic obstructive pulmonary disease) (HCC)    CVA (cerebral vascular accident) (HCC) 03/21/2018   "numb all over; head to toe" (03/21/2018)   Fibromyalgia    History of gout    Hyperlipidemia    Hypertension    Migraine    "used to get them monthly during menopause" (03/21/2018)   Neuromuscular disorder (HCC)    OSA (obstructive sleep apnea)    "don't tolerate the machine" (03/21/2018)   Pneumonia ~ 2007   Type 2 diabetes, diet controlled (HCC)     Home Medications Prior to Admission medications   Medication Sig Start Date End Date Taking? Authorizing Provider  acetaminophen (TYLENOL) 325 MG tablet Take 2 tablets (650 mg total) by mouth every 6 (six) hours as needed for mild pain, moderate pain, fever or headache (or temp > 37.5 C (99.5 F)). Patient taking differently: Take 650 mg by mouth at bedtime. 09/22/21   Elgergawy, Leana Roe, MD  amLODipine (NORVASC) 10 MG tablet Take 10 mg by mouth daily. 08/29/21   [provider]  Artificial Tear Ointment (DRY EYES OP) Place 1 drop into both eyes 2 (two) times daily as needed (dry eyes).    [provider]  aspirin EC 81 MG tablet Take 81 mg by mouth daily. Swallow whole.     [provider]  atorvastatin (LIPITOR) 80 MG tablet Take 80 mg by mouth every evening.    [provider]  cholecalciferol (VITAMIN D3) 25 MCG (1000 UNIT) tablet Take 1,000 Units by mouth daily.    [provider]  clopidogrel (PLAVIX) 75 MG tablet Take 1 tablet (75 mg total) by mouth daily. 01/11/22 01/11/23  Levin Erp, MD  dantrolene (DANTRIUM) 50 MG capsule Take 50 mg by mouth at bedtime. 03/02/22   [provider]  docusate sodium (COLACE) 100 MG capsule Take 100 mg by mouth daily as needed for mild constipation.    [provider]  feeding supplement (ENSURE ENLIVE / ENSURE PLUS) LIQD Take 237 mLs by mouth 2 (two) times daily between meals. 01/11/22   Levin Erp, MD  lidocaine (LIDODERM) 5 % Place 1 patch onto the skin daily. Remove & Discard patch within 12 hours or as directed by MD 01/12/22   Levin Erp, MD  Multiple Vitamins-Minerals (ZINC PO) Take 1 tablet by mouth daily.    [provider]  pantoprazole (PROTONIX) 40 MG tablet Take 1 tablet (40 mg total) by mouth 2 (two) times daily. Please take 40 mg oral twice daily x12 weeks, then take 40 mg oral once daily. Patient not taking: Reported on 03/17/2022  09/22/21   Elgergawy, Leana Roe, MD  POTASSIUM PO Take 3 tablets by mouth daily.    [provider]  tiZANidine (ZANAFLEX) 2 MG tablet Take 1 tablet (2 mg total) by mouth at bedtime as needed for muscle spasms. Patient not taking: Reported on 03/17/2022 01/11/22   Levin Erp, MD      Allergies    Baclofen and Nickel    Review of Systems   Review of Systems  Physical Exam Updated Vital Signs BP 133/66   Pulse 77   Temp 98.6 F (37 C) (Oral)   Resp 10   Ht 5\' 2"  (1.575 m)   Wt 78 kg   SpO2 97%   BMI 31.46 kg/m  Physical Exam Vitals reviewed.  HENT:     Head: Normocephalic.  Cardiovascular:     Rate and Rhythm: Regular rhythm.  Abdominal:     Tenderness: There is no abdominal tenderness.   Musculoskeletal:     Cervical back: Neck supple.  Neurological:     Mental Status: She is alert.     Comments: Awake and pleasant.     ED Results / Procedures / Treatments   Labs (all labs ordered are listed, but only abnormal results are displayed) Labs Reviewed  CBC WITH DIFFERENTIAL/PLATELET - Abnormal; Notable for the following components:      Result Value   WBC 13.2 (*)    Neutro Abs 10.4 (*)    All other components within normal limits  COMPREHENSIVE METABOLIC PANEL - Abnormal; Notable for the following components:   CO2 21 (*)    Glucose, Bld 168 (*)    All other components within normal limits  CBG MONITORING, ED - Abnormal; Notable for the following components:   Glucose-Capillary 195 (*)    All other components within normal limits  URINE CULTURE  LIPASE, BLOOD  URINALYSIS, ROUTINE W REFLEX MICROSCOPIC    EKG EKG Interpretation  Date/Time:  Friday April 06 2022 23:08:48 EDT Ventricular Rate:  83 PR Interval:  156 QRS Duration: 100 QT Interval:  406 QTC Calculation: 477 R Axis:   -58 Text Interpretation: Normal sinus rhythm Left anterior fascicular block Minimal voltage criteria for LVH, may be normal variant ( Cornell product ) Septal infarct , age undetermined Abnormal ECG When compared with ECG of 06-Jan-2022 17:22, PREVIOUS ECG IS PRESENT Confirmed by 08-Jan-2022 [phone]) on 04/07/2022 2:39:18 AM  Radiology DG ABD ACUTE 2+V W 1V CHEST  Result Date: 04/07/2022 CLINICAL DATA:  Stomach pain. EXAM: DG ABDOMEN ACUTE WITH 1 VIEW CHEST COMPARISON:  March 17, 2022 FINDINGS: There is no evidence of dilated bowel loops or free intraperitoneal air. A large amount of stool is seen within the sigmoid colon. No radiopaque calculi are seen. Radiopaque surgical clips are seen overlying the right upper quadrant. A cluster of adjacent subcentimeter soft tissue calcifications is seen. Heart size and mediastinal contours are within normal limits. Low lung volumes are  noted. Both lungs are clear. IMPRESSION: 1. Large amount of stool within the sigmoid colon without evidence of bowel obstruction. 2. No acute cardiopulmonary disease. Electronically Signed   By: March 19, 2022 M.D.   On: 04/07/2022 03:25    Procedures Procedures    Medications Ordered in ED Medications  sorbitol, milk of mag, mineral oil, glycerin (SMOG) enema (has no administration in time range)    ED Course/ Medical Decision Making/ A&P Clinical Course as of 04/07/22 0718  Sat Apr 07, 2022  0710 Constipated without bm 2-3D. Stool burden  on XR. Getting enema. ? UTI. F/u urine and enema.  [JG]    Clinical Course User Index [JG] Duard Brady, MD                           Medical Decision Making  Patient presents with reported constipation.  Reportedly not having a bowel movement in few days.  Also question of UTI.  Patient states she is currently being treated for UTI after being seen yesterday.  I do not see where this was done however. Lab work done and showed mild leukocytosis.  Will get x-ray of abdomen and urinalysis. Patient had a near syncopal episode while in the waiting room and switching from 1 chair to another.  EKG shows sinus rhythm.  Does have mild tachycardia.  Discharge 23 forgot when she was waiting the waiting room for hours to have a leg and near syncopal episode whenever trying to transfer from one chair   x-ray did not show x-ray independently interpreted does show large amount of sigmoid stool.  Will give enema.  Still pending urine.  Care turned over to Dr. Rush Landmark       Final Clinical Impression(s) / ED Diagnoses Final diagnoses:  Constipation, unspecified constipation type    Rx / DC Orders ED Discharge Orders     None         Benjiman Core, MD 04/07/22 [phone]

## 2022-04-07 NOTE — ED Notes (Signed)
IV team at bedside 

## 2022-04-07 NOTE — ED Notes (Signed)
Pt had a huge stool good consistency, pur wick removed was soiled linens cleaned and clean diaper placed

## 2022-04-07 NOTE — Hospital Course (Addendum)
Thank you Jade [name] is a 67 year old female with a past medical history including T2DM with neuropathy, CVA with residual right-sided weakness, COPD, stage IIIa CKD, HTN, and HLD who presents with lower abdominal pain and hematuria was admitted for suspected persistent Klebsiella pneumonia UTI.   UTI Likely persistent Klebsiella pneumonia UTI Lower abdominal pain/hematuria Originally presented to [facility] in May with positive culture for Klebsiella pneumonia sensitive to imipenem and Zosyn. During that admission she was treated with 5 days of ceftriaxone which was discontinued after culture and sensitivity results we available. She was not started on a new antibiotic due to being asymptomatic at that time. She then went to [facility] ED on 03/17/2022 and was re-cultured and treated with Zosyn and discharged with a prescription for fosfomycin. However, per the son they were not able to pick up the fosfomycin. Over 2 hospital visits she has been treated with 5 days of ceftriaxone and 1 day of Zosyn. Here UA shows large amount of leukocytes and positive for nitrites with bacteria and white cells present.  Leukocytosis at 13.2.  Lactic acid 0.9 and 1.3.  Low suspicion for pyelonephritis or sepsis. We believe this is likely persistent UTI and we treated her with 10 days of zosyn after discussing with pharmacy. Urine culture here showed ***. Blood cultures showed no growth throughout admission***. Patient was transferred to SNF for acute rehab.   COPD No PFTs on file. Quit smoking over 20 years ago and not on any home O2.  No signs of exacerbation or pulmonary infection at this time.    Hypertension Hyperlipidemia Continued home regimen of amlodipine 10 mg daily, aspirin 81 mg daily, Plavix 75 mg daily.   T2DM with neuropathy A1c of 5.7 in 1 of 2023.  Currently not on any diabetic medications.  She is on DANTROLENE*** NOT INSULIN*** (dumb dragon) at home but she cannot member for what.  We  will hold this medication while in the hospital.   Chronic Pruritis Flat Affect Patient denies any use of psych drugs in the past but she is wearing gloves during the interview and states that these are so she does no scratch herself. - We will trial calamine lotion.   Hypokalemia Potassium of 3.2 on day 2, started oral Kcl 40 mEq twice daily.

## 2022-04-08 DIAGNOSIS — Z87891 Personal history of nicotine dependence: Secondary | ICD-10-CM | POA: Diagnosis not present

## 2022-04-08 DIAGNOSIS — L298 Other pruritus: Secondary | ICD-10-CM | POA: Diagnosis not present

## 2022-04-08 DIAGNOSIS — E876 Hypokalemia: Secondary | ICD-10-CM | POA: Diagnosis not present

## 2022-04-08 DIAGNOSIS — R109 Unspecified abdominal pain: Secondary | ICD-10-CM | POA: Diagnosis not present

## 2022-04-08 DIAGNOSIS — Z7902 Long term (current) use of antithrombotics/antiplatelets: Secondary | ICD-10-CM | POA: Diagnosis not present

## 2022-04-08 DIAGNOSIS — Z66 Do not resuscitate: Secondary | ICD-10-CM | POA: Diagnosis present

## 2022-04-08 DIAGNOSIS — I69351 Hemiplegia and hemiparesis following cerebral infarction affecting right dominant side: Secondary | ICD-10-CM | POA: Diagnosis not present

## 2022-04-08 DIAGNOSIS — N1831 Chronic kidney disease, stage 3a: Secondary | ICD-10-CM | POA: Diagnosis present

## 2022-04-08 DIAGNOSIS — L299 Pruritus, unspecified: Secondary | ICD-10-CM | POA: Diagnosis present

## 2022-04-08 DIAGNOSIS — Z79899 Other long term (current) drug therapy: Secondary | ICD-10-CM | POA: Diagnosis not present

## 2022-04-08 DIAGNOSIS — R55 Syncope and collapse: Secondary | ICD-10-CM | POA: Diagnosis present

## 2022-04-08 DIAGNOSIS — G4733 Obstructive sleep apnea (adult) (pediatric): Secondary | ICD-10-CM | POA: Diagnosis present

## 2022-04-08 DIAGNOSIS — B9689 Other specified bacterial agents as the cause of diseases classified elsewhere: Secondary | ICD-10-CM | POA: Diagnosis not present

## 2022-04-08 DIAGNOSIS — N390 Urinary tract infection, site not specified: Secondary | ICD-10-CM | POA: Diagnosis present

## 2022-04-08 DIAGNOSIS — I444 Left anterior fascicular block: Secondary | ICD-10-CM | POA: Diagnosis present

## 2022-04-08 DIAGNOSIS — Z7982 Long term (current) use of aspirin: Secondary | ICD-10-CM | POA: Diagnosis not present

## 2022-04-08 DIAGNOSIS — K5900 Constipation, unspecified: Secondary | ICD-10-CM

## 2022-04-08 DIAGNOSIS — Z888 Allergy status to other drugs, medicaments and biological substances status: Secondary | ICD-10-CM | POA: Diagnosis not present

## 2022-04-08 DIAGNOSIS — B964 Proteus (mirabilis) (morganii) as the cause of diseases classified elsewhere: Secondary | ICD-10-CM | POA: Diagnosis not present

## 2022-04-08 DIAGNOSIS — B961 Klebsiella pneumoniae [K. pneumoniae] as the cause of diseases classified elsewhere: Secondary | ICD-10-CM | POA: Diagnosis present

## 2022-04-08 DIAGNOSIS — J449 Chronic obstructive pulmonary disease, unspecified: Secondary | ICD-10-CM | POA: Diagnosis present

## 2022-04-08 DIAGNOSIS — R319 Hematuria, unspecified: Secondary | ICD-10-CM | POA: Diagnosis present

## 2022-04-08 DIAGNOSIS — E1122 Type 2 diabetes mellitus with diabetic chronic kidney disease: Secondary | ICD-10-CM | POA: Diagnosis present

## 2022-04-08 DIAGNOSIS — E1140 Type 2 diabetes mellitus with diabetic neuropathy, unspecified: Secondary | ICD-10-CM | POA: Diagnosis not present

## 2022-04-08 DIAGNOSIS — E785 Hyperlipidemia, unspecified: Secondary | ICD-10-CM | POA: Diagnosis present

## 2022-04-08 DIAGNOSIS — I129 Hypertensive chronic kidney disease with stage 1 through stage 4 chronic kidney disease, or unspecified chronic kidney disease: Secondary | ICD-10-CM | POA: Diagnosis present

## 2022-04-08 DIAGNOSIS — E1149 Type 2 diabetes mellitus with other diabetic neurological complication: Secondary | ICD-10-CM | POA: Diagnosis present

## 2022-04-08 DIAGNOSIS — M159 Polyosteoarthritis, unspecified: Secondary | ICD-10-CM | POA: Diagnosis present

## 2022-04-08 DIAGNOSIS — M797 Fibromyalgia: Secondary | ICD-10-CM | POA: Diagnosis present

## 2022-04-08 DIAGNOSIS — Z87440 Personal history of urinary (tract) infections: Secondary | ICD-10-CM | POA: Diagnosis not present

## 2022-04-08 LAB — BASIC METABOLIC PANEL
Anion gap: 8 (ref 5–15)
BUN: 15 mg/dL (ref 8–23)
CO2: 23 mmol/L (ref 22–32)
Calcium: 9.2 mg/dL (ref 8.9–10.3)
Chloride: 109 mmol/L (ref 98–111)
Creatinine, Ser: 0.98 mg/dL (ref 0.44–1.00)
GFR, Estimated: >60 mL/min (ref >60)
Glucose, Bld: 110 mg/dL — ABNORMAL HIGH (ref 70–99)
Potassium: 3.2 mmol/L — ABNORMAL LOW (ref 3.5–5.1)
Sodium: 140 mmol/L (ref 135–145)

## 2022-04-08 LAB — CBC
HCT: 34.6 % — ABNORMAL LOW (ref 36.0–46.0)
Hemoglobin: 11.8 g/dL — ABNORMAL LOW (ref 12.0–15.0)
MCH: 32.4 pg (ref 26.0–34.0)
MCHC: 34.1 g/dL (ref 30.0–36.0)
MCV: 95.1 fL (ref 80.0–100.0)
Platelets: 309 10*3/uL (ref 150–400)
RBC: 3.64 MIL/uL — ABNORMAL LOW (ref 3.87–5.11)
RDW: 14.2 % (ref 11.5–15.5)
WBC: 11.9 10*3/uL — ABNORMAL HIGH (ref 4.0–10.5)
nRBC: 0 % (ref 0.0–0.2)

## 2022-04-08 MED ORDER — POTASSIUM CHLORIDE CRYS ER 20 MEQ PO TBCR
40.0000 meq | EXTENDED_RELEASE_TABLET | Freq: Two times a day (BID) | ORAL | Status: DC
  Administered 2022-04-08 – 2022-04-09 (×4): 40 meq via ORAL
  Filled 2022-04-08 (×4): qty 2

## 2022-04-08 NOTE — Evaluation (Signed)
Occupational Therapy Evaluation Patient Details Name: Jade [name] MRN: [id number] DOB: 15-Jan-1956 Today's Date: 04/08/2022   History of Present Illness 66 y.o. female presenting 04/06/22 with lower abdominal pain and hematuria with persistent Klebsiella pneumonia UTI. PMH: arthritis, CKD, COPD, CVAs with R hemiparesis, fibromyalgia, gout, HTN, migraine, OSA, DM2   Clinical Impression   Pt dependent at baseline for ADLs, questionable historian but reports using w/c and RW for mobility, but she is in bed mostly at home. Lives with spouse and son who work during the day. Pt currently with BUE weakness R > L, unable to bring hand to mouth/hold cup to self feed, needing max-total A+2 for all ADLs, bed mobility, and transfers. Pt presenting with impairments listed below, will follow acutely. Recommend SNF, if family declining then pt will need HHOT and 24/7 assistance to safely d/c home.     Recommendations for follow up therapy are one component of a multi-disciplinary discharge planning process, led by the attending physician.  Recommendations may be updated based on patient status, additional functional criteria and insurance authorization.   Follow Up Recommendations  Skilled nursing-short term rehab (<3 hours/day)    Assistance Recommended at Discharge Frequent or constant Supervision/Assistance  Patient can return home with the following Two people to help with walking and/or transfers;A lot of help with bathing/dressing/bathroom;Assistance with cooking/housework;Assistance with feeding;Direct supervision/assist for medications management;Direct supervision/assist for financial management;Assist for transportation;Help with stairs or ramp for entrance    Functional Status Assessment  Patient has had a recent decline in their functional status and demonstrates the ability to make significant improvements in function in a reasonable and predictable amount of time.  Equipment  Recommendations  None recommended by OT (defer)    Recommendations for Other Services PT consult     Precautions / Restrictions Precautions Precautions: Fall;Other (comment) Precaution Comments: R hemi from prior CVA Restrictions Weight Bearing Restrictions: No      Mobility Bed Mobility Overal bed mobility: Needs Assistance Bed Mobility: Supine to Sit     Supine to sit: Max assist, +2 for physical assistance, +2 for safety/equipment, HOB elevated     General bed mobility comments: MaxAx2 to manage legs and trunk to pivot to sit R EOB from elevated HOB, pt trying to assist with legs slightly.    Transfers Overall transfer level: Needs assistance Equipment used: 2 person hand held assist Transfers: Sit to/from Stand, Bed to chair/wheelchair/BSC Sit to Stand: Max assist, +2 physical assistance, +2 safety/equipment Stand pivot transfers: Max assist, +2 physical assistance, +2 safety/equipment         General transfer comment: MaxAx2 using bed pad to act as sling under pt's buttocks to power pt up to stand from EOB and pivot to R bed > recliner.      Balance Overall balance assessment: Needs assistance Sitting-balance support: No upper extremity supported, Feet supported Sitting balance-Leahy Scale: Poor Sitting balance - Comments: Min guard-minA for static sitting balance, leans to R. Postural control: Right lateral lean Standing balance support: Bilateral upper extremity supported Standing balance-Leahy Scale: Zero Standing balance comment: MaxAx2 and UE support to stand.                           ADL either performed or assessed with clinical judgement   ADL Overall ADL's : Needs assistance/impaired Eating/Feeding: Moderate assistance   Grooming: Moderate assistance   Upper Body Bathing: Total assistance   Lower Body Bathing: Total assistance   Upper Body  Dressing : Total assistance   Lower Body Dressing: Total assistance   Toilet Transfer:  Total assistance;+2 for physical assistance   Toileting- Clothing Manipulation and Hygiene: Total assistance       Functional mobility during ADLs: Maximal assistance;Total assistance;+2 for physical assistance       Vision   Additional Comments: will further assess     Perception     Praxis      Pertinent Vitals/Pain Pain Assessment Pain Assessment: Faces Pain Score: 6  Faces Pain Scale: Hurts even more Pain Location: generalized with movement Pain Descriptors / Indicators: Discomfort, Grimacing, Guarding Pain Intervention(s): Limited activity within patient's tolerance, Monitored during session, Repositioned     Hand Dominance     Extremity/Trunk Assessment Upper Extremity Assessment Upper Extremity Assessment: RUE deficits/detail;LUE deficits/detail RUE Deficits / Details: increased tone in shoulder and elbow, can weakly grasp with R hand, unable to bring hand to mouth RUE Coordination: decreased fine motor;decreased gross motor LUE Deficits / Details: significant weakness, but stronger/increased ROM/strength compared to RUE, cannot bring hand to mouth LUE Coordination: decreased gross motor;decreased fine motor   Lower Extremity Assessment Lower Extremity Assessment: Defer to PT evaluation RLE Deficits / Details: noted deficits in ankle dorsiflexion and plantarflexion AROM, but able to extend knee in bed; tends to hold hip in external rotation and knee in slight flexion, stiffness noted; gross weakness noted throughout, hemi from prior CVA; poor motor coordination noted, appears apraxic RLE Coordination: decreased gross motor;decreased fine motor LLE Deficits / Details: tends to rest with hip adducted with internal rotation and knee flexed; poor motor coordination noted, appears apraxic; gross weakness noted LLE Coordination: decreased gross motor;decreased fine motor   Cervical / Trunk Assessment Cervical / Trunk Assessment: Kyphotic   Communication  Communication Communication: No difficulties   Cognition Arousal/Alertness: Awake/alert Behavior During Therapy: Flat affect Overall Cognitive Status: No family/caregiver present to determine baseline cognitive functioning                                 General Comments: Pt with contradictory information compared to prior info obtained from prior admission. Pt requiring increased time to process all cues and for sequencing.     General Comments  discussed with RN to order soft touch call bell, VSS on RA    Exercises     Shoulder Instructions      Home Living Family/patient expects to be discharged to:: Private residence Living Arrangements: Spouse/significant other Available Help at Discharge: Family;Available PRN/intermittently (son & spouse, both work during the day) Type of Home: House Home Access: Level entry     Home Layout: One level     Bathroom Shower/Tub: Chief Strategy Officer: Standard Bathroom Accessibility: No   Home Equipment: Agricultural consultant (2 wheels);BSC/3in1;Wheelchair - Manufacturing systems engineer;Tub bench;Grab bars - tub/shower;Grab bars - toilet;Hospital bed;Other (comment) (denies having hoyer lift and hospital bed at home -- questionable historian)   Additional Comments: son and husband both work      Prior Functioning/Environment Prior Level of Function : Needs assist  Cognitive Assist : Mobility (cognitive)     Physical Assist : Mobility (physical);ADLs (physical) Mobility (physical): Bed mobility;Transfers;Gait ADLs (physical): Bathing;Dressing;Toileting;IADLs;Grooming Mobility Comments: Needs assistance for all functional mobility, primarily stays in bed or w/c, but needs assistance for w/c mobility also; walks very short bouts with RW ADLs Comments: Needs assistance for all ADLs, wears depends. Cannot feed self and is in bed "most  of the time"        OT Problem List: Decreased strength;Decreased range of  motion;Decreased activity tolerance;Impaired balance (sitting and/or standing);Decreased cognition;Decreased safety awareness;Impaired UE functional use;Impaired tone;Decreased coordination      OT Treatment/Interventions: Self-care/ADL training;Therapeutic exercise;Neuromuscular education;Energy conservation;Manual therapy;DME and/or AE instruction;Modalities;Splinting;Therapeutic activities;Cognitive remediation/compensation;Visual/perceptual remediation/compensation;Patient/family education;Balance training    OT Goals(Current goals can be found in the care plan section) Acute Rehab OT Goals Patient Stated Goal: none stated OT Goal Formulation: With patient Time For Goal Achievement: 04/22/22 ADL Goals Pt Will Perform Eating: with min assist;with adaptive utensils;sitting;bed level Pt Will Perform Grooming: with min assist;bed level;sitting Additional ADL Goal #1: pt will perform bed mobility with mod A in prep for ADLs  OT Frequency: Min 2X/week    Co-evaluation PT/OT/SLP Co-Evaluation/Treatment: Yes Reason for Co-Treatment: Complexity of the patient's impairments (multi-system involvement);For patient/therapist safety;To address functional/ADL transfers PT goals addressed during session: Mobility/safety with mobility;Balance OT goals addressed during session: ADL's and self-care      AM-PAC OT "6 Clicks" Daily Activity     Outcome Measure Help from another person eating meals?: A Lot Help from another person taking care of personal grooming?: A Lot Help from another person toileting, which includes using toliet, bedpan, or urinal?: Total Help from another person bathing (including washing, rinsing, drying)?: Total Help from another person to put on and taking off regular upper body clothing?: Total Help from another person to put on and taking off regular lower body clothing?: Total 6 Click Score: 8   End of Session Equipment Utilized During Treatment: Gait belt Nurse  Communication: Mobility status  Activity Tolerance: Patient tolerated treatment well Patient left: with chair alarm set;with call bell/phone within reach;in chair  OT Visit Diagnosis: Unsteadiness on feet (R26.81);Other abnormalities of gait and mobility (R26.89);Muscle weakness (generalized) (M62.81);History of falling (Z91.81);Feeding difficulties (R63.3);Hemiplegia and hemiparesis;Other symptoms and signs involving cognitive function                Time: 2841-3244 OT Time Calculation (min): 25 min Charges:  OT General Charges $OT Visit: 1 Visit OT Evaluation $OT Eval Moderate Complexity: 1 [address], OTD, OTR/L Acute Rehab [phone]   Mayer Masker 04/08/2022, 9:05 AM

## 2022-04-08 NOTE — Evaluation (Signed)
Physical Therapy Evaluation Patient Details Name: Jade [name] MRN: [id number] DOB: [birth year] Today's Date: 04/08/2022  History of Present Illness  66 y.o. female presenting 04/06/22 with lower abdominal pain and hematuria with persistent Klebsiella pneumonia UTI. PMH: arthritis, CKD, COPD, CVAs with R hemiparesis, fibromyalgia, gout, HTN, migraine, OSA, DM2   Clinical Impression  Pt presents with condition above and deficits mentioned below, see PT Problem List. PTA, she was living at home with her husband. Her husband and son can assist her, but both work so she is home alone for periods of time during the day. At baseline, pt needs assistance for all ADLs and functional mobility, primarily staying in her bed or w/c throughout the day. She reports she does ambulate, but very short distances with a RW and assistance. Pt with contradictory statements at times, thus not a great historian though and no family present at time of eval. Pt demonstrates deficits in bil lower extremity tone, strength, and coordination with a hx of CVA affecting her R side. She also demonstrates deficits in balance and activity tolerance, requiring maxAx2 for all bed mobility and sit to stand and stand pivot transfers today. As pt is home alone parts of the day and needing extensive physical assistance, recommending rehab at a SNF. However, if family can manage her and they can get [facility] services to assist when they are at work then HHPT could be an option. Will continue to follow acutely.     Recommendations for follow up therapy are one component of a multi-disciplinary discharge planning process, led by the attending physician.  Recommendations may be updated based on patient status, additional functional criteria and insurance authorization.  Follow Up Recommendations Skilled nursing-short term rehab (<3 hours/day) (if family declines SNF then max HH services) Can patient physically be transported by private vehicle:  No    Assistance Recommended at Discharge Frequent or constant Supervision/Assistance  Patient can return home with the following  Two people to help with walking and/or transfers;Two people to help with bathing/dressing/bathroom;Assistance with cooking/housework;Assistance with feeding;Direct supervision/assist for medications management;Direct supervision/assist for financial management;Assist for transportation    Equipment Recommendations Hospital bed;Other (comment) (hoyer lift; power w/c)  Recommendations for Other Services       Functional Status Assessment Patient has had a recent decline in their functional status and demonstrates the ability to make significant improvements in function in a reasonable and predictable amount of time.     Precautions / Restrictions Precautions Precautions: Fall;Other (comment) Precaution Comments: R hemi from prior CVA Restrictions Weight Bearing Restrictions: No      Mobility  Bed Mobility Overal bed mobility: Needs Assistance Bed Mobility: Supine to Sit     Supine to sit: Max assist, +2 for physical assistance, +2 for safety/equipment, HOB elevated     General bed mobility comments: MaxAx2 to manage legs and trunk to pivot to sit R EOB from elevated HOB, pt trying to assist with legs slightly.    Transfers Overall transfer level: Needs assistance Equipment used: 2 person hand held assist Transfers: Sit to/from Stand, Bed to chair/wheelchair/BSC Sit to Stand: Max assist, +2 physical assistance, +2 safety/equipment Stand pivot transfers: Max assist, +2 physical assistance, +2 safety/equipment         General transfer comment: MaxAx2 using bed pad to act as sling under pt's buttocks to power pt up to stand from EOB and pivot to R bed > recliner.    Ambulation/Gait  General Gait Details: unable  Stairs            Wheelchair Mobility    Modified Rankin (Stroke Patients Only)       Balance  Overall balance assessment: Needs assistance Sitting-balance support: No upper extremity supported, Feet supported Sitting balance-Leahy Scale: Poor Sitting balance - Comments: Min guard-minA for static sitting balance, leans to R. Postural control: Right lateral lean Standing balance support: Bilateral upper extremity supported Standing balance-Leahy Scale: Zero Standing balance comment: MaxAx2 and UE support to stand.                             Pertinent Vitals/Pain Pain Assessment Pain Assessment: Faces Faces Pain Scale: Hurts even more Pain Location: "everywhere", but particularly her legs Pain Descriptors / Indicators: Discomfort, Grimacing, Guarding Pain Intervention(s): Limited activity within patient's tolerance, Monitored during session, Repositioned    Home Living Family/patient expects to be discharged to:: Private residence Living Arrangements: Spouse/significant other Available Help at Discharge: Family;Available PRN/intermittently (son assists often also, but both son and husband work) Type of Home: House Home Access: Level entry       Home Layout: One level Home Equipment: Agricultural consultant (2 wheels);BSC/3in1;Wheelchair - Manufacturing systems engineer;Tub bench;Grab bars - tub/shower;Grab bars - toilet;Hospital bed;Other (comment) (hoyer lift- pt denies having hospital bed and hoyer lift this time (questionable historian)) Additional Comments: son and husband both work    Prior Function Prior Level of Function : Needs assist       Physical Assist : Mobility (physical);ADLs (physical) Mobility (physical): Bed mobility;Transfers;Gait ADLs (physical): Bathing;Dressing;Toileting;IADLs;Grooming Mobility Comments: Needs assistance for all functional mobility, primarily stays in bed or w/c, but needs assistance for w/c mobility also; walks very short bouts with RW ADLs Comments: Needs assistance for all ADLs, wears depends.     Hand Dominance         Extremity/Trunk Assessment   Upper Extremity Assessment Upper Extremity Assessment: Defer to OT evaluation    Lower Extremity Assessment Lower Extremity Assessment: RLE deficits/detail;LLE deficits/detail RLE Deficits / Details: noted deficits in ankle dorsiflexion and plantarflexion AROM, but able to extend knee in bed; tends to hold hip in external rotation and knee in slight flexion, stiffness noted; gross weakness noted throughout, hemi from prior CVA; poor motor coordination noted, appears apraxic RLE Coordination: decreased gross motor;decreased fine motor LLE Deficits / Details: tends to rest with hip adducted with internal rotation and knee flexed; poor motor coordination noted, appears apraxic; gross weakness noted LLE Coordination: decreased gross motor;decreased fine motor    Cervical / Trunk Assessment Cervical / Trunk Assessment: Kyphotic  Communication   Communication: No difficulties  Cognition Arousal/Alertness: Awake/alert Behavior During Therapy: Flat affect Overall Cognitive Status: No family/caregiver present to determine baseline cognitive functioning                                 General Comments: Pt with contradictory information compared to prior info obtained from prior admission. Pt requiring increased time to process all cues and for sequencing.        General Comments General comments (skin integrity, edema, etc.): coordinated with nursing staff to order pt soft touch call bell    Exercises     Assessment/Plan    PT Assessment Patient needs continued PT services  PT Problem List Decreased strength;Decreased activity tolerance;Decreased balance;Decreased mobility;Decreased coordination;Decreased cognition;Decreased knowledge of use of DME;Impaired tone  PT Treatment Interventions DME instruction;Gait training;Functional mobility training;Therapeutic activities;Therapeutic exercise;Balance training;Neuromuscular  re-education;Cognitive remediation;Wheelchair mobility training;Patient/family education    PT Goals (Current goals can be found in the Care Plan section)  Acute Rehab PT Goals Patient Stated Goal: to improve PT Goal Formulation: With patient Time For Goal Achievement: 04/22/22 Potential to Achieve Goals: Fair    Frequency Min 3X/week     Co-evaluation PT/OT/SLP Co-Evaluation/Treatment: Yes Reason for Co-Treatment: For patient/therapist safety;To address functional/ADL transfers PT goals addressed during session: Mobility/safety with mobility;Balance         AM-PAC PT "6 Clicks" Mobility  Outcome Measure Help needed turning from your back to your side while in a flat bed without using bedrails?: Total Help needed moving from lying on your back to sitting on the side of a flat bed without using bedrails?: Total Help needed moving to and from a bed to a chair (including a wheelchair)?: Total Help needed standing up from a chair using your arms (e.g., wheelchair or bedside chair)?: Total Help needed to walk in hospital room?: Total Help needed climbing 3-5 steps with a railing? : Total 6 Click Score: 6    End of Session Equipment Utilized During Treatment: Gait belt Activity Tolerance: Patient tolerated treatment well Patient left: in chair;with call bell/phone within reach;with chair alarm set Nurse Communication: Mobility status;Need for lift equipment;Other (comment) (need for soft touch call bell) PT Visit Diagnosis: Unsteadiness on feet (R26.81);Muscle weakness (generalized) (M62.81);Difficulty in walking, not elsewhere classified (R26.2);History of falling (Z91.81);Apraxia (R48.2)    Time: 4854-6270 PT Time Calculation (min) (ACUTE ONLY): 23 min   Charges:   PT Evaluation $PT Eval Moderate Complexity: 1 Mod          Raymond Gurney, PT, DPT Acute Rehabilitation Services  Office: [phone]   Jewel Baize 04/08/2022, 8:44 AM

## 2022-04-08 NOTE — Progress Notes (Signed)
HD#0 Subjective:   Summary: Jade [name] is a 66 year old female with a past medical history including T2DM with neuropathy, CVA with residual right-sided weakness, COPD, stage IIIa CKD, HTN, and HLD who presents with lower abdominal pain and hematuria was admitted for suspected persistent Klebsiella pneumonia UTI.  Overnight Events: No acute events overnight  Morning patient is sitting in the bedside chair and she states that she had a rough night but this was mostly due to to difficulty sleeping in the hospital.  She denies any new or worsening symptoms.  She is still having the same lower abdominal pain but again this is not getting any worse.  Objective:  Vital signs in last 24 hours: Vitals:   04/07/22 1908 04/08/22 0017 04/08/22 0415 04/08/22 0831  BP: (!) 156/84 (!) 166/108 (!) 158/71 (!) 155/76  Pulse: 67 72 68 60  Resp: 14 16 16 13   Temp: 98.6 F (37 C) 98.4 F (36.9 C) 97.6 F (36.4 C) 97.9 F (36.6 C)  TempSrc: Oral Oral Oral Oral  SpO2: 100% 97% 96% 100%  Weight:      Height:       Supplemental O2: Room Air SpO2: 100 %   Physical Exam:  Constitutional: Elderly female sitting in bedside chair, in no acute distress HENT: normocephalic atraumatic Cardiovascular: regular rate and rhythm Pulmonary/Chest: normal work of breathing on room air, exam limited by patient refusal MSK: No lower extremity edema, patient with gloves on, limited mobility of the right arm Neurological: alert & oriented x 3 Skin: warm and dry Psych: Flat affect  Filed Weights   04/07/22 0310  Weight: 78 kg     Intake/Output Summary (Last 24 hours) at 04/08/2022 0848 Last data filed at 04/08/2022 0423 Gross per 24 hour  Intake 10.07 ml  Output 500 ml  Net -489.93 ml   Net IO Since Admission: -489.93 mL [04/08/22 0848]  Pertinent Labs:    Latest Ref Rng & Units 04/08/2022    4:15 AM 04/06/2022   10:52 PM 03/17/2022    3:38 PM  CBC  WBC 4.0 - 10.5 K/uL 11.9  13.2  7.6    Hemoglobin 12.0 - 15.0 g/dL 03/19/2022  39.7  67.3   Hematocrit 36.0 - 46.0 % 34.6  44.5  39.0   Platelets 150 - 400 K/uL 309  391  346        Latest Ref Rng & Units 04/08/2022    4:15 AM 04/06/2022   10:52 PM 03/17/2022    3:38 PM  CMP  Glucose 70 - 99 mg/dL 03/19/2022  379  024   BUN 8 - 23 mg/dL 15  21  12    Creatinine 0.44 - 1.00 mg/dL 097   3.53   Sodium 135 - 145 mmol/L 140  139  138   Potassium 3.5 - 5.1 mmol/L 3.2  4.0  3.9   Chloride 98 - 111 mmol/L 109  108  107   CO2 22 - 32 mmol/L 23  21  22    Calcium 8.9 - 10.3 mg/dL 9.2  9.8  9.4   Total Protein 6.5 - 8.1 g/dL  7.2  6.9   Total Bilirubin 0.3 - 1.2 mg/dL  0.8  0.6   Alkaline Phos 38 - 126 U/L  79  75   AST 15 - 41 U/L  25  29   ALT 0 - 44 U/L  16  15     Imaging: No results found.  Assessment/Plan:  Principal Problem:   Urinary tract infection   Patient Summary: Jade [name] is a 66 year old female with a past medical history including T2DM with neuropathy, CVA with residual right-sided weakness, COPD, stage IIIa CKD, HTN, and HLD who presents with lower abdominal pain and hematuria was admitted for suspected persistent Klebsiella pneumonia UTI.  UTI Likely persistent Klebsiella pneumonia UTI Lower abdominal pain/hematuria Originally presented in May with positive culture for Klebsiella pneumonia sensitive to imipenem and Zosyn.  Over 2 hospital visits has been treated with 5 days of ceftriaxone and 1 day of Zosyn.  Here UA shows large amount of leukocytes and positive for nitrites with bacteria and white cells present.  Leukocytosis at 13.2.  Lactic acid 0.9 and 1.3.  Low suspicion for pyelonephritis.  We believe this is likely persistent UTI we will treat based on prior sensitivities while we wait for new urine culture. - Urine and blood cultures pending - Zosyn 3.375 g q8h, day 2/10, stop date 8/21   COPD No PFTs on file.  Quit smoking over 20 years ago and not on any home O2.  We will continue to monitor  respiratory status.   Hypertension Hyperlipidemia Continue home regimen of amlodipine 10 mg daily, aspirin 81 mg daily, Plavix 75 mg daily.   T2DM with neuropathy A1c of 5.7 in 1 of 2023.  Currently not on any diabetic medications.  She is on insulin at home but she cannot member for what.  We will hold this medication while in the hospital.   Chronic Pruritis Flat Affect Patient denies any use of psych drugs in the past but she is wearing gloves during the interview and states that these are so she does no scratch herself. - We will trial calamine lotion.  Hypokalemia Potassium of 3.2 today, given oral 40 mEq twice daily.  Diet: Normal VTE: DOAC Code: DNR PT/OT recs: SNF for Subacute PT, Recommend hospital bed. TOC recs: Pending Family Update: Plan to contact son about PCP needs.   Dispo: Anticipated discharge to Skilled nursing facility i pending placement and continued IV antibiotics.  Rocky Morel DO Internal Medicine Resident PGY-1 Pager: [phone]  Please contact the on call pager after 5 pm and on weekends at [phone].

## 2022-04-09 DIAGNOSIS — E785 Hyperlipidemia, unspecified: Secondary | ICD-10-CM

## 2022-04-09 DIAGNOSIS — R319 Hematuria, unspecified: Secondary | ICD-10-CM

## 2022-04-09 DIAGNOSIS — L298 Other pruritus: Secondary | ICD-10-CM

## 2022-04-09 DIAGNOSIS — N390 Urinary tract infection, site not specified: Secondary | ICD-10-CM

## 2022-04-09 DIAGNOSIS — I10 Essential (primary) hypertension: Secondary | ICD-10-CM

## 2022-04-09 DIAGNOSIS — R109 Unspecified abdominal pain: Secondary | ICD-10-CM | POA: Diagnosis not present

## 2022-04-09 DIAGNOSIS — F3489 Other specified persistent mood disorders: Secondary | ICD-10-CM

## 2022-04-09 DIAGNOSIS — Z87891 Personal history of nicotine dependence: Secondary | ICD-10-CM

## 2022-04-09 DIAGNOSIS — N1831 Chronic kidney disease, stage 3a: Secondary | ICD-10-CM

## 2022-04-09 DIAGNOSIS — E1140 Type 2 diabetes mellitus with diabetic neuropathy, unspecified: Secondary | ICD-10-CM

## 2022-04-09 DIAGNOSIS — J449 Chronic obstructive pulmonary disease, unspecified: Secondary | ICD-10-CM

## 2022-04-09 LAB — CBC
HCT: 36.6 % (ref 36.0–46.0)
Hemoglobin: 11.8 g/dL — ABNORMAL LOW (ref 12.0–15.0)
MCH: 31.6 pg (ref 26.0–34.0)
MCHC: 32.2 g/dL (ref 30.0–36.0)
MCV: 97.9 fL (ref 80.0–100.0)
Platelets: 299 10*3/uL (ref 150–400)
RBC: 3.74 MIL/uL — ABNORMAL LOW (ref 3.87–5.11)
RDW: 14.2 % (ref 11.5–15.5)
WBC: 9.2 10*3/uL (ref 4.0–10.5)
nRBC: 0 % (ref 0.0–0.2)

## 2022-04-09 LAB — BASIC METABOLIC PANEL
Anion gap: 9 (ref 5–15)
BUN: 24 mg/dL — ABNORMAL HIGH (ref 8–23)
CO2: 22 mmol/L (ref 22–32)
Calcium: 9.2 mg/dL (ref 8.9–10.3)
Chloride: 108 mmol/L (ref 98–111)
Creatinine, Ser: 1.19 mg/dL — ABNORMAL HIGH (ref 0.44–1.00)
GFR, Estimated: 50 mL/min — ABNORMAL LOW (ref >60)
Glucose, Bld: 126 mg/dL — ABNORMAL HIGH (ref 70–99)
Potassium: 4 mmol/L (ref 3.5–5.1)
Sodium: 139 mmol/L (ref 135–145)

## 2022-04-09 MED ORDER — POLYETHYLENE GLYCOL 3350 17 G PO PACK
17.0000 g | PACK | Freq: Every day | ORAL | Status: DC
  Administered 2022-04-09 – 2022-04-11 (×3): 17 g via ORAL
  Filled 2022-04-09 (×3): qty 1

## 2022-04-09 NOTE — TOC Initial Note (Signed)
Transition of Care [facility]) - Initial/Assessment Note    Patient Details  Name: Jade [name] MRN: [id number] Date of Birth: 1956-06-13  Transition of [facility] CM/SW Contact:    Curlene Labrum, RN Phone Number: 04/09/2022, 3:40 PM  Clinical Narrative:                 CM met with the patient this morning to discuss therapy recommendations for SNF placement.  The patient states that she currently lives with her ex-husband and son at the home.  The patient's son works during the day but has flexible hours to assist with care at times.  I discuss with the patient that PT/OT evaluations are recommending SNF placement and the patient was agreeable for placement.  I discussed with the patient that she may likely need IV antibiotics through 04/16/22 as recommended by the medical team - pending cultures.  If the patient is medically stable but requiring IV antibiotics - she will need PICC line placed prior to discharge to the facility - medical team updated.  The patient's FL2 was completed and the patient was faxed out in the hub for SNF placement.  04/09/2022 1543 - I met with the patient and ex-husband at the bedside along with spanish interpreter to discuss SNF placement and offer Medicare choice regarding SNF placement.  The patient chose 1. [facility] and 2. [facility].  A message was sent to medical team to determine when patient would be medically stable for discharge and have PICC line placed if IV antibiotics are needs versus PO.  CM will continue to follow the patient for TOC needs - including possible need for PICC line and start of insurance authorization once determined.  Expected Discharge Plan: Skilled Nursing Facility Barriers to Discharge: Continued Medical Work up   Patient Goals and CMS Choice Patient states their goals for this hospitalization and ongoing recovery are:: To go to rehab CMS Medicare.gov Compare Post Acute Care list provided to::  Patient Choice offered to / list presented to : Patient, [name]  Expected Discharge Plan and Services Expected Discharge Plan: [place]   Discharge Planning Services: CM Consult Post Acute Care Choice: [place] Living arrangements for the past 2 months: Single Family Home                                      Prior Living Arrangements/Services Living arrangements for the past 2 months: Single Family Home Lives with:: Spouse, Adult Children Patient language and need for interpreter reviewed:: Yes Do you feel safe going back to the place where you live?: Yes      Need for Family Participation in Patient Care: Yes (Comment) Care giver support system in place?: Yes (comment) Current home services:  (Has RW and WC at the home) Criminal Activity/Legal Involvement Pertinent to Current Situation/Hospitalization: No - Comment as needed  Activities of Daily Living Home Assistive Devices/Equipment: Wheelchair ADL Screening (condition at time of admission) Patient's cognitive ability adequate to safely complete daily activities?: No Is the patient deaf or have difficulty hearing?: No Does the patient have difficulty seeing, even when wearing glasses/contacts?: Yes Does the patient have difficulty concentrating, remembering, or making decisions?: Yes Patient able to express need for assistance with ADLs?: Yes Does the patient have difficulty dressing or bathing?: Yes Independently performs ADLs?: No Communication: Independent Dressing (OT): Needs assistance  Is this a change from baseline?: Pre-admission baseline Grooming: Needs assistance Is this a change from baseline?: Pre-admission baseline Feeding: Needs assistance Is this a change from baseline?: Pre-admission baseline Bathing: Needs assistance Is this a change from baseline?: Pre-admission baseline Toileting: Needs assistance Is this a change from  baseline?: Pre-admission baseline In/Out Bed: Needs assistance Is this a change from baseline?: Pre-admission baseline Walks in Home: Needs assistance Is this a change from baseline?: Pre-admission baseline Does the patient have difficulty walking or climbing stairs?: Yes Weakness of Legs: Both Weakness of Arms/Hands: Both  Permission Sought/Granted Permission sought to share information with : Case Manager, Family Supports, Chartered certified accountant granted to share information with : Yes, Verbal Permission Granted     Permission granted to share info w AGENCY: SNF facility  Permission granted to share info w Relationship: ex-husband - Jose     Emotional Assessment Appearance:: Appears stated age Attitude/Demeanor/Rapport: Gracious Affect (typically observed): Accepting Orientation: : Oriented to Self, Oriented to Place, Oriented to  Time, Oriented to Situation Alcohol / Substance Use: Not Applicable Psych Involvement: No (comment)  Admission diagnosis:  Urinary tract infection [N39.0] Acute cystitis without hematuria [N30.00] Constipation, unspecified constipation type [K59.00] Patient Active Problem List   Diagnosis Date Noted   Constipation    Urinary tract infection 04/07/2022   Urinary tract infection without hematuria    Goals of care, counseling/discussion    Weakness following cerebrovascular accident (CVA) 01/06/2022   Duodenal ulcer    Acute arterial ischemic stroke, multifocal, posterior circulation, unspecified laterality ([place]) 09/16/2021   Anemia 09/16/2021   COVID-19 06/25/2021   Cerebral thrombosis with cerebral infarction 06/19/2021   Weakness due to old stroke 06/17/2021   Labile blood glucose    Essential hypertension    Spastic hemiparesis (HCC)    Stage 3a chronic kidney disease (HCC)    Controlled type 2 diabetes mellitus with hyperglycemia, without long-term current use of insulin ([place])    CVA (cerebral vascular accident) ([place])  04/13/2021   TIA (transient ischemic attack) 04/02/2021   Carotid artery stenosis with cerebral infarction ([place]) 04/02/2021   COPD (chronic obstructive pulmonary disease) ([place]) 04/02/2021   Stroke-like symptoms 03/05/2021   Acute CVA (cerebrovascular accident) ([place]) 03/21/2018   Obesity (BMI 30.0-34.9) 03/21/2018   IBS (irritable bowel syndrome) 04/12/2015   Hair loss 10/20/2014   Primary osteoarthritis of left knee 09/29/2014   Visit for screening mammogram 08/30/2014   Routine general medical examination at a health care facility 08/30/2014   Tinea corporis 12/21/2013   Essential hypertension, benign 12/21/2013   Low back pain 08/24/2013   Patient noncompliant with statin medication 02/23/2013   H/O abnormal Pap smear 10/24/2012   Osteopenia 09/26/2012   Diabetic neuropathy, painful ([place]) 05/26/2012   Diabetes mellitus type 2 with neurological manifestations ([place]) 03/20/2012   Pure hypercholesterolemia 03/20/2012   Chronic venous insufficiency 03/20/2012   PCP:  Sue Lush, PA-C Pharmacy:   [facility], [place] [address] Phone: [phone] Fax: [phone]     Social Determinants of Health (SDOH) Interventions    Readmission Risk Interventions    04/09/2022    3:40 PM 06/30/2021    1:48 PM  Readmission Risk Prevention Plan  Transportation Screening Complete Complete  PCP or Specialist Appt within 3-5 Days Complete Complete  HRI or Home Care Consult Complete Complete  Social Work Consult for Recovery Care Planning/Counseling Complete Complete  Palliative  Care Screening Complete Not Applicable  Medication Review (RN Care Manager) Complete

## 2022-04-09 NOTE — NC FL2 (Signed)
[place] MEDICAID FL2 LEVEL OF CARE SCREENING TOOL     IDENTIFICATION  Patient Name: Jade [name] Birthdate: May 11, 1956 Sex: female Admission Date (Current Location): 04/06/2022  [facility] and [place] Number:  Producer, television/film/video and Address:  [facility], [address], [place], [place] [address]      Provider Number: [id number]  Attending Physician Name and Address:  Gust Rung, DO  Relative Name and Phone Number:       Current Level of Care: Hospital Recommended Level of Care: Skilled Nursing Facility Prior Approval Number:    Date Approved/Denied:   PASRR Number: [id number]  Discharge Plan: SNF    Current Diagnoses: Patient Active Problem List   Diagnosis Date Noted   Constipation    Urinary tract infection 04/07/2022   Urinary tract infection without hematuria    Goals of care, counseling/discussion    Weakness following cerebrovascular accident (CVA) 01/06/2022   Duodenal ulcer    Acute arterial ischemic stroke, multifocal, posterior circulation, unspecified laterality (HCC) 09/16/2021   Anemia 09/16/2021   COVID-19 06/25/2021   Cerebral thrombosis with cerebral infarction 06/19/2021   Weakness due to old stroke 06/17/2021   Labile blood glucose    Essential hypertension    Spastic hemiparesis (HCC)    Stage 3a chronic kidney disease (HCC)    Controlled type 2 diabetes mellitus with hyperglycemia, without long-term current use of insulin (HCC)    CVA (cerebral vascular accident) (HCC) 04/13/2021   TIA (transient ischemic attack) 04/02/2021   Carotid artery stenosis with cerebral infarction (HCC) 04/02/2021   COPD (chronic obstructive pulmonary disease) (HCC) 04/02/2021   Stroke-like symptoms 03/05/2021   Acute CVA (cerebrovascular accident) (HCC) 03/21/2018   Obesity, Class III, BMI 40-49.9 (morbid obesity) (HCC) 03/21/2018   IBS (irritable bowel syndrome) 04/12/2015   Hair loss 10/20/2014   Primary osteoarthritis of  left knee 09/29/2014   Visit for screening mammogram 08/30/2014   Routine general medical examination at a health care facility 08/30/2014   Tinea corporis 12/21/2013   Essential hypertension, benign 12/21/2013   Low back pain 08/24/2013   Patient noncompliant with statin medication 02/23/2013   H/O abnormal Pap smear 10/24/2012   Osteopenia 09/26/2012   Diabetic neuropathy, painful (HCC) 05/26/2012   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   Pure hypercholesterolemia 03/20/2012   Chronic venous insufficiency 03/20/2012    Orientation RESPIRATION BLADDER Height & Weight     Self, Time, Situation, Place  Normal External catheter Weight: 78 kg Height:  5\' 2"  (157.5 cm)  BEHAVIORAL SYMPTOMS/MOOD NEUROLOGICAL BOWEL NUTRITION STATUS      Continent Diet  AMBULATORY STATUS COMMUNICATION OF NEEDS Skin   Total Care Verbally Other (Comment) (redness in sacrum, perineum)                       Personal Care Assistance Level of Assistance  Bathing, Feeding, Dressing Bathing Assistance: Maximum assistance Feeding assistance: Limited assistance Dressing Assistance: Maximum assistance     Functional Limitations Info  Sight, Hearing, Speech Sight Info: Impaired (Blurred, Impaired vision in both eyes) Hearing Info: Adequate Speech Info: Adequate    SPECIAL CARE FACTORS FREQUENCY  OT (By licensed OT), PT (By licensed PT)     PT Frequency: 3-5 x per week OT Frequency: 3-5 x per week            Contractures      Additional Factors Info  Code Status, Allergies Code Status  Info: DNR Allergies Info: baclofen, nickel           Current Medications (04/09/2022):  This is the current hospital active medication list Current Facility-Administered Medications  Medication Dose Route Frequency Provider Last Rate Last Admin   amLODipine (NORVASC) tablet 10 mg  10 mg Oral Daily Rocky Morel, DO   10 mg at 04/09/22 1030   aspirin EC tablet 81 mg   81 mg Oral Daily Rocky Morel, DO   81 mg at 04/09/22 1030   atorvastatin (LIPITOR) tablet 80 mg  80 mg Oral QPM Rocky Morel, DO   80 mg at 04/08/22 1731   calamine lotion 1 Application  1 Application Topical PRN Rocky Morel, DO       clopidogrel (PLAVIX) tablet 75 mg  75 mg Oral Daily Rocky Morel, DO   75 mg at 04/09/22 1030   feeding supplement (ENSURE ENLIVE / ENSURE PLUS) liquid 237 mL  237 mL Oral BID BM Rocky Morel, DO   237 mL at 04/09/22 1030   piperacillin-tazobactam (ZOSYN) IVPB 3.375 g  3.375 g Intravenous Q8H Tegeler, Canary Brim, MD 12.5 mL/hr at 04/09/22 1031 3.375 g at 04/09/22 1031   polyethylene glycol (MIRALAX / GLYCOLAX) packet 17 g  17 g Oral Daily PRN Katsadouros, Vasilios, MD       potassium chloride SA (KLOR-CON M) CR tablet 40 mEq  40 mEq Oral BID Rocky Morel, DO   40 mEq at 04/09/22 1030   rivaroxaban (XARELTO) tablet 10 mg  10 mg Oral Daily Katsadouros, Vasilios, MD   10 mg at 04/09/22 1030   sodium chloride flush (NS) 0.9 % injection 10-40 mL  10-40 mL Intracatheter PRN Mercie Eon, MD       tiZANidine (ZANAFLEX) tablet 2 mg  2 mg Oral QHS PRN Rocky Morel, DO         Discharge Medications: Please see discharge summary for a list of discharge medications.  Relevant Imaging Results:  Relevant Lab Results:   Additional Information SS# [id number].  Janae Bridgeman, RN

## 2022-04-09 NOTE — Progress Notes (Signed)
HD#1 Subjective:   Summary: This is a 66 year old female with a past medical history of type 2 diabetes, stroke with residual right-sided weakness, COPD, CKD stage IIIa, hypertension, who presents with acute abdominal pain and hematuria, and was admitted for suspected persistent Klebsiella pneumoniae UTI.   Overnight events: No acute overnight events.  Patient is resting in bed comfortably upon my exam.  Patient has no complaints today.  Patient states that she is doing fine.  We have a conversation regarding possible SNF placement as per recommended from PT, and patient is agreeable at bedside.  Patient is willing to go to skilled nursing facility for rehab.  At this point we will wait for social work to place her in a SNF.  Objective:  Vital signs in last 24 hours: Vitals:   04/08/22 0831 04/08/22 1157 04/08/22 1939 04/09/22 0755  BP: (!) 155/76 (!) 141/81 (!) 154/82 (!) 156/73  Pulse: 60 60 65 66  Resp: 13 12 16 16   Temp: 97.9 F (36.6 C) 98.4 F (36.9 C) 97.9 F (36.6 C) 97.7 F (36.5 C)  TempSrc: Oral Oral Oral Oral  SpO2: 100% 100% 99% 99%  Weight:      Height:       Supplemental O2: Room Air SpO2: 99 %   Physical Exam:  General: Patient is resting comfortably in bed in no acute distress  Eyes: Non injected conjunctiva  Head: Normocephalic, atraumatic  Neck: Supple, nontender, full range of motion Cardio: Regular rate and rhythm, no murmurs, rubs or gallops Chest: No chest tenderness Pulmonary: Clear to ausculation bilaterally with no rales, rhonchi, and crackles  Abdomen: Soft, nontender with normoactive bowel sounds with no rebound or guarding  Neuro: Sensation intact to upper and lower extremities. Skin: No rashes noted  MSK: Patient has residual weakness to the right upper and lower extremities.  Left upper extremity with a glove on.   Filed Weights   04/07/22 0310  Weight: 78 kg     Intake/Output Summary (Last 24 hours) at 04/09/2022 1352 Last data  filed at 04/09/2022 1300 Gross per 24 hour  Intake 720 ml  Output 650 ml  Net 70 ml   Net IO Since Admission: 180.07 mL [04/09/22 1352]  Pertinent Labs:    Latest Ref Rng & Units 04/09/2022    3:35 AM 04/08/2022    4:15 AM 04/06/2022   10:52 PM  CBC  WBC 4.0 - 10.5 K/uL 9.2  11.9  13.2   Hemoglobin 12.0 - 15.0 g/dL 06/06/2022  51.0  25.8   Hematocrit 36.0 - 46.0 % 36.6  34.6  44.5   Platelets 150 - 400 K/uL 299  309  391        Latest Ref Rng & Units 04/09/2022    3:35 AM 04/08/2022    4:15 AM 04/06/2022   10:52 PM  CMP  Glucose 70 - 99 mg/dL 06/06/2022  782  423   BUN 8 - 23 mg/dL 24  15  21    Creatinine 0.44 - 1.00 mg/dL 536   1.44   Sodium 135 - 145 mmol/L 139  140  139   Potassium 3.5 - 5.1 mmol/L 4.0  3.2  4.0   Chloride 98 - 111 mmol/L 108  109  108   CO2 22 - 32 mmol/L 22  23  21    Calcium 8.9 - 10.3 mg/dL 9.2  9.2  9.8   Total Protein 6.5 - 8.1 g/dL   7.2   Total  Bilirubin 0.3 - 1.2 mg/dL   0.8   Alkaline Phos 38 - 126 U/L   79   AST 15 - 41 U/L   25   ALT 0 - 44 U/L   16     Imaging: No results found.  Assessment/Plan:   Principal Problem:   Urinary tract infection Active Problems:   Constipation   Patient Summary: This is a 66 year old female with a past medical history of type 2 diabetes, stroke with residual right-sided weakness, COPD, CKD stage IIIa, hypertension admitted for UTI suspected persistent Klebsiella pneumonia  UTI Likely persistent Klebsiella pneumonia UTI Lower abdominal pain/hematuria  Patient was originally seen in May with positive culture for Klebsiella pneumoniae that was sensitive for imipenem and Zosyn.  Patient was treated with ceftriaxone and Zosyn, and never fully eradicated.  The UA here showed large amounts of leukocytes and nitrites with bacteria.  Initial presentation showed a leukocytosis of 13.2.  This has decreased to 9.2 today.  Patient is on day 3/10 of Zosyn with anticipated stop date of 08/21.  Preliminary cultures showing  gram-negative rods.  Will await sensitivities.  Plan: - Await sensitivities - Continue Zosyn with today being day 3/10, anticipated stop date 8/21 - There is alternative plan to possibly get her off of zosyn with fosfomycin so she would not have to go out with a PICC line but will see what sensitivities show first.   COPD. Patient currently has no PFTs on file, the patient does have a history of smoking 20 years.  We will monitor respiratory status while inpatient.  Plan: - Monitor respiratory status. -O2 saturations have been in 100% on room air.   Hypertension Hyperlipidemia Patient has a past medical history of hypertension hyperlipidemia.  Patient is on home regimen of amlodipine 10 mg, aspirin 81 mg, Plavix 75 mg.  Plan: - Continue home meds  T2DM with neuropathy Patient most recent A1c 5.7 in January 2023.  Patient is currently not on any diabetic medication.  Patient does not endorse much neuropathy pain.  Patient does take insulin at home, but given that she has been having sugars ranging from 126 to 168, will hold insulin  Plan: - Hold home insulin -Glucose measuring from 126-168 this is within range.   Chronic Pruritis Flat Affect Patient continues to have a flat affect.  Patient is wearing a glove on her left hand to avoid scratching herself.  Patient states that the calamine lotion has been helping  Plan: - Continue calamine lotion  Hypokalemia, resolved Patient was found to have potassium of 3.2 on 08/14, patient was repleted.  Today potassium is at 4.0.  Plan: - Potassium at goal.  Continue to monitor BMP  Diet: Normal VTE: DOAC Code: DNR PT/OT recs: SNF for Subacute PT, Recommend hospital bed. TOC recs: Pending Family Update: Plan to contact son about PCP needs.   Dispo: Anticipate discharge to skilled nursing facility pending at this moment.  Continue Zosyn IV until disposition is final. Plan would be hopefully D/C tomorrow.   Modena Slater DO Internal  Medicine Resident PGY-1 Pager: [phone] Please contact the on call pager after 5 pm and on weekends at [phone].

## 2022-04-10 DIAGNOSIS — B961 Klebsiella pneumoniae [K. pneumoniae] as the cause of diseases classified elsewhere: Secondary | ICD-10-CM

## 2022-04-10 DIAGNOSIS — B9689 Other specified bacterial agents as the cause of diseases classified elsewhere: Secondary | ICD-10-CM

## 2022-04-10 DIAGNOSIS — B964 Proteus (mirabilis) (morganii) as the cause of diseases classified elsewhere: Secondary | ICD-10-CM

## 2022-04-10 DIAGNOSIS — L299 Pruritus, unspecified: Secondary | ICD-10-CM

## 2022-04-10 DIAGNOSIS — N390 Urinary tract infection, site not specified: Secondary | ICD-10-CM | POA: Diagnosis not present

## 2022-04-10 DIAGNOSIS — R319 Hematuria, unspecified: Secondary | ICD-10-CM | POA: Diagnosis not present

## 2022-04-10 DIAGNOSIS — R109 Unspecified abdominal pain: Secondary | ICD-10-CM | POA: Diagnosis not present

## 2022-04-10 LAB — URINE CULTURE: Culture: >=100000 — AB

## 2022-04-10 LAB — BASIC METABOLIC PANEL
Anion gap: 5 (ref 5–15)
BUN: 17 mg/dL (ref 8–23)
CO2: 23 mmol/L (ref 22–32)
Calcium: 9.5 mg/dL (ref 8.9–10.3)
Chloride: 112 mmol/L — ABNORMAL HIGH (ref 98–111)
Creatinine, Ser: 0.95 mg/dL (ref 0.44–1.00)
GFR, Estimated: >60 mL/min (ref >60)
Glucose, Bld: 121 mg/dL — ABNORMAL HIGH (ref 70–99)
Potassium: 4.4 mmol/L (ref 3.5–5.1)
Sodium: 140 mmol/L (ref 135–145)

## 2022-04-10 MED ORDER — NITROFURANTOIN MONOHYD MACRO 100 MG PO CAPS
100.0000 mg | ORAL_CAPSULE | Freq: Two times a day (BID) | ORAL | Status: DC
  Filled 2022-04-10 (×2): qty 1

## 2022-04-10 MED ORDER — FOSFOMYCIN TROMETHAMINE 3 G PO PACK
3.0000 g | PACK | Freq: Once | ORAL | Status: AC
  Administered 2022-04-10: 3 g via ORAL
  Filled 2022-04-10: qty 3

## 2022-04-10 MED ORDER — NITROFURANTOIN MONOHYD MACRO 100 MG PO CAPS: 100.0000 mg | ORAL_CAPSULE | Freq: Two times a day (BID) | ORAL | 0 refills | Status: AC

## 2022-04-10 NOTE — TOC Progression Note (Addendum)
Transition of Care [facility]) - Progression Note    Patient Details  Name: Jade [name] MRN: [id number] Date of Birth: [birth year]  Transition of Care [facility]) CM/SW Contact  Janae Bridgeman, RN Phone Number: 04/10/2022, 8:40 AM  Clinical Narrative:    Insurance authorization started with [facility] - authorization pending at this time.  Maud Deed, DON at [facility] to visit with the patient at the bedside to discuss admission and complete admission paperwork for likely placement today/tomorrow if authorization is approved.  CM will continue to follow the patient for SNF placement  04/10/2022 1345 - Insurance authorization is not complete at this time through [facility].  I called [employer] and clinicals were uploaded in the hub earlier this morning at 10 am but authorization is still pending.   [facility] is aware and holding a bed for the patient for likely discharge to the facility tomorrow once the authorization is complete.  I called and notified hospital leadership, Macario Golds and she is aware.  She sent a message to leadership at [facility] to review if possible.  [facility] is out of power at this time due to weather.  I completed a PTAR packet and placed in the hard chart to hold just in case insurance authorization is approved this afternoon and power is turned back on at the facility.  Medicine Team Physicians are aware and will remove the discharge this afternoon and place new EDD pending for tomorrow, 04/11/2022 if needed.   Expected Discharge Plan: Skilled Nursing Facility Barriers to Discharge: Continued Medical Work up  Expected Discharge Plan and Services Expected Discharge Plan: Skilled Nursing Facility   Discharge Planning Services: CM Consult Post Acute Care Choice: Skilled Nursing Facility Living arrangements for the past 2 months: Single Family Home                                       Social Determinants of Health (SDOH)  Interventions    Readmission Risk Interventions    04/09/2022    3:40 PM 06/30/2021    1:48 PM  Readmission Risk Prevention Plan  Transportation Screening Complete Complete  PCP or Specialist Appt within 3-5 Days Complete Complete  HRI or Home Care Consult Complete Complete  Social Work Consult for Recovery Care Planning/Counseling Complete Complete  Palliative Care Screening Complete Not Applicable  Medication Review Oceanographer) Complete

## 2022-04-10 NOTE — Discharge Summary (Signed)
Name: Jade [name] MRN: [id number] DOB: 10/10/1955 66 y.o. PCP: Jade [name]  Date of Admission: 04/06/2022 10:16 PM Date of Discharge: 04/10/2022 Attending Physician: Jade Rung, DO  Discharge Diagnosis: 1. Principal Problem:   Urinary tract infection Active Problems:   Diabetes mellitus type 2 with neurological manifestations (HCC)   Essential hypertension, benign   COPD (chronic obstructive pulmonary disease) (HCC)   Stage 3a chronic kidney disease (HCC)   Constipation   Discharge Medications: Allergies as of 04/10/2022       Reactions   Baclofen Other (See Comments)   Pt said felt weaker and had slurred speech   Nickel Dermatitis, Rash        Medication List     TAKE these medications    acetaminophen 325 MG tablet Commonly known as: TYLENOL Take 2 tablets (650 mg total) by mouth every 6 (six) hours as needed for mild pain, moderate pain, fever or headache (or temp > 37.5 C (99.5 F)). What changed: when to take this   amLODipine 10 MG tablet Commonly known as: NORVASC Take 10 mg by mouth daily.   aspirin EC 81 MG tablet Take 81 mg by mouth daily. Swallow whole.   atorvastatin 80 MG tablet Commonly known as: LIPITOR Take 80 mg by mouth every evening.   cholecalciferol 25 MCG (1000 UNIT) tablet Commonly known as: VITAMIN D3 Take 1,000 Units by mouth daily.   clopidogrel 75 MG tablet Commonly known as: Plavix Take 1 tablet (75 mg total) by mouth daily.   dantrolene 50 MG capsule Commonly known as: DANTRIUM Take 50 mg by mouth at bedtime.   docusate sodium 100 MG capsule Commonly known as: COLACE Take 100 mg by mouth daily.   DRY EYES OP Place 1 drop into both eyes 2 (two) times daily as needed (dry eyes).   feeding supplement Liqd Take 237 mLs by mouth 2 (two) times daily between meals.   nitrofurantoin (macrocrystal-monohydrate) 100 MG capsule Commonly known as: MACROBID Take 1 capsule (100 mg total) by mouth every 12  (twelve) hours for 3 days. Start taking on: April 11, 2022   pantoprazole 40 MG tablet Commonly known as: Protonix Take 1 tablet (40 mg total) by mouth 2 (two) times daily. Please take 40 mg oral twice daily x12 weeks, then take 40 mg oral once daily.   POTASSIUM PO Take 3 tablets by mouth daily.   tiZANidine 2 MG tablet Commonly known as: ZANAFLEX Take 1 tablet (2 mg total) by mouth at bedtime as needed for muscle spasms.   ZINC PO Take 1 tablet by mouth daily.        Disposition and follow-up:   [name] was discharged from [facility] in Stable condition.  At the hospital follow up visit please address:  1.    Klebsiella and Proteus UTI -She received 3 days of Zosyn and 1 dose of fosfomycin.  She will finish 3 more days of nitrofurantoin  Patient is on dantrolene at home.  We are unsure of the indication.  Please reassess.  No changes to her home medications was made during this admission  2.  Labs / imaging needed at time of follow-up: NA  3.  Pending labs/ test needing follow-up: BMP  Follow-up Appointments:  Follow-up with PCP after leaving SNF  Hospital Course by problem list:  Jade [name] is a 66 year old female with a past medical history including T2DM with neuropathy, CVA with residual right-sided weakness, COPD,  stage IIIa CKD, HTN, and HLD who presents with lower abdominal pain and hematuria was admitted for Klebsiella pneumonia UTI.   Klebsiella pneumonia and Proteus UTI Originally presented to [facility] in May with positive culture for Klebsiella pneumonia sensitive to imipenem and Zosyn. During that admission she was treated with 5 days of ceftriaxone which was discontinued after culture and sensitivity results we available. She was not started on a new antibiotic due to being asymptomatic at that time. She then went to [facility] ED on 03/17/2022 and was re-cultured and treated with Zosyn and discharged with a  prescription for fosfomycin. However, per the son they were not able to pick up the fosfomycin. Over 2 hospital visits she has been treated with 5 days of ceftriaxone and 1 day of Zosyn.   Here UA shows large amount of leukocytes and positive for nitrites with bacteria and white cells present.  Leukocytosis at 13.2.  Lactic acid 0.9 and 1.3.  Low suspicion for pyelonephritis or sepsis. Blood cultures showed no growth throughout admission.  Which treated with 3 days of Zosyn and 1 dose of fosfomycin.  She will continue 3 more days of Macrobid.  Her Proteus UTIs should be covered with 3 days of Zosyn.  HPI Patient is seen at bedside.  She appears comfortable in no acute distress.  She reports doing well, good appetite and bowel movement.  She denies any abdominal pain or dysuria.  She is [facility].  Discharge Exam:   BP (!) 168/91 (BP Location: Right Leg)   Pulse 70   Temp 97.6 F (36.4 C) (Oral)   Resp 16   Ht 5\' 2"  (1.575 m)   Wt 78 kg   SpO2 98%   BMI 31.46 kg/m  Discharge exam: Physical Exam Constitutional:      General: She is not in acute distress.    Appearance: She is not ill-appearing.  HENT:     Head: Normocephalic.  Eyes:     General:        Right eye: No discharge.        Left eye: No discharge.     Conjunctiva/sclera: Conjunctivae normal.  Abdominal:     General: There is no distension.     Palpations: Abdomen is soft.     Tenderness: There is no abdominal tenderness.  Musculoskeletal:     Cervical back: Normal range of motion.  Skin:    General: Skin is warm.  Neurological:     Mental Status: She is alert and oriented to person, place, and time.  Psychiatric:        Mood and Affect: Mood normal.      Pertinent Labs, Studies, and Procedures:     Latest Ref Rng & Units 04/09/2022    3:35 AM 04/08/2022    4:15 AM 04/06/2022   10:52 PM  CBC  WBC 4.0 - 10.5 K/uL 9.2  11.9  13.2   Hemoglobin 12.0 - 15.0 g/dL  06/06/2022  44.0  34.7   Hematocrit 36.0 - 46.0 % 36.6  34.6  44.5   Platelets 150 - 400 K/uL 299  309  391       Latest Ref Rng & Units 04/10/2022    3:21 AM 04/09/2022    3:35 AM 04/08/2022    4:15 AM  CMP  Glucose 70 - 99 mg/dL 04/10/2022  956  387   BUN 8 - 23 mg/dL 17  24  15   Creatinine 0.44 - 1.00 mg/dL 0.95  1.19  0.98   Sodium 135 - 145 mmol/L 140  139  140   Potassium 3.5 - 5.1 mmol/L 4.4  4.0  3.2   Chloride 98 - 111 mmol/L 112  108  109   CO2 22 - 32 mmol/L 23  22  23    Calcium 8.9 - 10.3 mg/dL 9.5  9.2  9.2    DG ABD ACUTE 2+V W 1V CHEST  Result Date: 04/07/2022 CLINICAL DATA:  Stomach pain. EXAM: DG ABDOMEN ACUTE WITH 1 VIEW CHEST COMPARISON:  March 17, 2022 FINDINGS: There is no evidence of dilated bowel loops or free intraperitoneal air. A large amount of stool is seen within the sigmoid colon. No radiopaque calculi are seen. Radiopaque surgical clips are seen overlying the right upper quadrant. A cluster of adjacent subcentimeter soft tissue calcifications is seen. Heart size and mediastinal contours are within normal limits. Low lung volumes are noted. Both lungs are clear. IMPRESSION: 1. Large amount of stool within the sigmoid colon without evidence of bowel obstruction. 2. No acute cardiopulmonary disease. Electronically Signed   By: Virgina Norfolk M.D.   On: 04/07/2022 03:25     Discharge Instructions: Discharge Instructions     Call MD for:  persistant nausea and vomiting   Complete by: As directed    Call MD for:  redness, tenderness, or signs of infection (pain, swelling, redness, odor or green/yellow discharge around incision site)   Complete by: As directed    Call MD for:  severe uncontrolled pain   Complete by: As directed    Call MD for:  temperature >100.4   Complete by: As directed    Diet - low sodium heart healthy   Complete by: As directed    Discharge instructions   Complete by: As directed    Ms. Wandell,  It was a pleasure taking care of you during  this admission.  You were hospitalized for abdominal pain, found to have a urinary tract infection.  You have received 3 days of IV antibiotic.  We will continue 3 more days of oral antibiotics to complete the course.  There was no other changes in your home medications.  You will be doing physical therapy at [facility] rehab facility.  Take care,  Dr. Alfonse Spruce   Increase activity slowly   Complete by: As directed        Signed: Gaylan Gerold, DO 04/10/2022, 11:54 AM   Pager: [phone]

## 2022-04-10 NOTE — Progress Notes (Signed)
Occupational Therapy Treatment Patient Details Name: Jade [name] MRN: [id number] DOB: 1955/09/18 Today's Date: 04/10/2022   History of present illness 66 y.o. female presenting 04/06/22 with lower abdominal pain and hematuria with persistent Klebsiella pneumonia UTI. PMH: arthritis, CKD, COPD, CVAs with R hemiparesis, fibromyalgia, gout, HTN, migraine, OSA, DM2   OT comments  Patient received in supine and agreeable to OT session. Patient performed grooming at bed level with HOB raised and mod assist due to assistance needed with loading toothbrush and combing hair. Patient tolerated RUE shoulder, elbow, and hand PROM to address tone with complaints of pain. Patient performed AAROM for LUE shoulder and AROM for LUE elbow and hand. Patient addressed self feeding with eating banana and drinking water with patient able to manage items but required verbal cues to locate. Patient to continue to be followed by acute OT.    Recommendations for follow up therapy are one component of a multi-disciplinary discharge planning process, led by the attending physician.  Recommendations may be updated based on patient status, additional functional criteria and insurance authorization.    Follow Up Recommendations  Skilled nursing-short term rehab (<3 hours/day)    Assistance Recommended at Discharge Frequent or constant Supervision/Assistance  Patient can return home with the following  Two people to help with walking and/or transfers;A lot of help with bathing/dressing/bathroom;Assistance with cooking/housework;Assistance with feeding;Direct supervision/assist for medications management;Direct supervision/assist for financial management;Assist for transportation;Help with stairs or ramp for entrance   Equipment Recommendations  None recommended by OT (defer to next venue)    Recommendations for Other Services      Precautions / Restrictions Precautions Precautions: Fall;Other (comment) Precaution  Comments: R hemi from prior CVA Restrictions Weight Bearing Restrictions: No       Mobility Bed Mobility Overal bed mobility: Needs Assistance Bed Mobility: Rolling Rolling: Max assist         General bed mobility comments: rolling in bed to assist with positioning and comfort    Transfers                   General transfer comment: deferred     Balance Overall balance assessment: Needs assistance     Sitting balance - Comments: patient seen at bed level                                   ADL either performed or assessed with clinical judgement   ADL Overall ADL's : Needs assistance/impaired Eating/Feeding: Minimal assistance Eating/Feeding Details (indicate cue type and reason): able to mange cup but difficulty locating, able to feed self finger foods Grooming: Wash/dry hands;Wash/dry face;Oral care;Brushing hair;Moderate assistance Grooming Details (indicate cue type and reason): required assistance to load toothbrush and to complete brushing hair                               General ADL Comments: grooming and feeding tasks performed at bed level    Extremity/Trunk Assessment Upper Extremity Assessment RUE Deficits / Details: increased tone in shoulder and elbow, can weakly grasp with R hand, unable to bring hand to mouth RUE Coordination: decreased fine motor;decreased gross motor LUE Deficits / Details: significant weakness, but stronger/increased ROM/strength compared to RUE, cannot bring hand to mouth LUE Coordination: decreased gross motor;decreased fine motor            Vision   Additional Comments:  difficulty scanning to left   Perception     Praxis      Cognition Arousal/Alertness: Awake/alert Behavior During Therapy: Flat affect Overall Cognitive Status: No family/caregiver present to determine baseline cognitive functioning                                 General Comments: oriented t self,  believed she was in [place] and could not give correct date or day of the week.        Exercises Exercises: General Upper Extremity General Exercises - Upper Extremity Shoulder Flexion: PROM, Right, AAROM, Left, 10 reps, Supine Shoulder Extension: PROM, Right, AAROM, Left, 10 reps, Supine Elbow Flexion: PROM, Right, AROM, Left, 10 reps, Supine Elbow Extension: PROM, Right, AROM, Left, 10 reps, Supine Wrist Flexion: PROM, Right Wrist Extension: PROM, Right    Shoulder Instructions       General Comments      Pertinent Vitals/ Pain       Pain Assessment Pain Assessment: Faces Faces Pain Scale: Hurts little more Pain Location: generalized with movement Pain Descriptors / Indicators: Discomfort, Grimacing, Guarding Pain Intervention(s): Limited activity within patient's tolerance, Monitored during session, Repositioned  Home Living                                          Prior Functioning/Environment              Frequency  Min 2X/week        Progress Toward Goals  OT Goals(current goals can now be found in the care plan section)  Progress towards OT goals: Progressing toward goals  Acute Rehab OT Goals Patient Stated Goal: get better OT Goal Formulation: With patient Time For Goal Achievement: 04/22/22 ADL Goals Pt Will Perform Eating: with min assist;with adaptive utensils;sitting;bed level Pt Will Perform Grooming: with min assist;bed level;sitting Additional ADL Goal #1: pt will perform bed mobility with mod A in prep for ADLs  Plan Discharge plan remains appropriate    Co-evaluation                 AM-PAC OT "6 Clicks" Daily Activity     Outcome Measure   Help from another person eating meals?: A Lot Help from another person taking care of personal grooming?: A Lot Help from another person toileting, which includes using toliet, bedpan, or urinal?: Total Help from another person bathing (including washing, rinsing,  drying)?: Total Help from another person to put on and taking off regular upper body clothing?: Total Help from another person to put on and taking off regular lower body clothing?: Total 6 Click Score: 8    End of Session    OT Visit Diagnosis: Unsteadiness on feet (R26.81);Other abnormalities of gait and mobility (R26.89);Muscle weakness (generalized) (M62.81);History of falling (Z91.81);Feeding difficulties (R63.3);Hemiplegia and hemiparesis;Other symptoms and signs involving cognitive function Hemiplegia - Right/Left: Right Hemiplegia - dominant/non-dominant: Dominant   Activity Tolerance Patient tolerated treatment well   Patient Left in bed;with call bell/phone within reach;with bed alarm set   Nurse Communication Mobility status        Time: 7017-7939 OT Time Calculation (min): 34 min  Charges: OT General Charges $OT Visit: 1 Visit OT Treatments $Self Care/Home Management : 23-37 mins  Alfonse Flavors, OTA Acute Rehabilitation Services  Office [phone]   Dewain Penning 04/10/2022,  12:50 PM

## 2022-04-10 NOTE — Progress Notes (Signed)
Physical Therapy Treatment Patient Details Name: Jade [name] MRN: [id number] DOB: 1956/01/24 Today's Date: 04/10/2022   History of Present Illness 66 y.o. female presenting 04/06/22 with lower abdominal pain and hematuria with persistent Klebsiella pneumonia UTI. PMH: arthritis, CKD, COPD, CVAs with R hemiparesis, fibromyalgia, gout, HTN, migraine, OSA, DM2    PT Comments    Pt eager to participate in PT. PT focused session on functional mobility and seated balance tasks. Pt overall requiring max assist for bed-level mobility, but tolerated sitting EOB x12 minutes with PT intermittently supporting trunk. SNF remains appropriate plan, will continue to follow.     Recommendations for follow up therapy are one component of a multi-disciplinary discharge planning process, led by the attending physician.  Recommendations may be updated based on patient status, additional functional criteria and insurance authorization.  Follow Up Recommendations  Skilled nursing-short term rehab (<3 hours/day) Can patient physically be transported by private vehicle: No   Assistance Recommended at Discharge Frequent or constant Supervision/Assistance  Patient can return home with the following Two people to help with walking and/or transfers;Two people to help with bathing/dressing/bathroom;Assistance with cooking/housework;Assistance with feeding;Direct supervision/assist for medications management;Direct supervision/assist for financial management;Assist for transportation   Equipment Recommendations  Hospital bed (hoyer, power w/c)    Recommendations for Other Services       Precautions / Restrictions Precautions Precautions: Fall;Other (comment) Precaution Comments: R hemiparesis with hypertonicity from prior CVA Restrictions Weight Bearing Restrictions: No     Mobility  Bed Mobility Overal bed mobility: Needs Assistance Bed Mobility: Supine to Sit, Sit to Supine     Supine to sit: Max  assist Sit to supine: Max assist   General bed mobility comments: assist for trunk and LE management, scooting to/from EOB.    Transfers Overall transfer level: Needs assistance   Transfers: Bed to chair/wheelchair/BSC            Lateral/Scoot Transfers: Max assist General transfer comment: lateral scooting towards L x2 with assist for hip lift, trunk support.    Ambulation/Gait               General Gait Details: unable   Stairs             Wheelchair Mobility    Modified Rankin (Stroke Patients Only)       Balance Overall balance assessment: Needs assistance Sitting-balance support: No upper extremity supported, Feet supported Sitting balance-Leahy Scale: Poor Sitting balance - Comments: periods of unsupported balance, transitioning to needing min truncal assist to prevent posterior leaning Postural control: Posterior lean                                  Cognition Arousal/Alertness: Awake/alert Behavior During Therapy: Flat affect Overall Cognitive Status: No family/caregiver present to determine baseline cognitive functioning                                 General Comments: PT asking about plan for rehab potentially today, pt states "I think I already went to rehab". oriented to self, pleasant and motivated to progress        Exercises General Exercises - Lower Extremity Long Arc Quad: AROM, Both, 10 reps, Seated Hip Flexion/Marching: AROM, Left, AAROM, Right, 10 reps, Seated    General Comments        Pertinent Vitals/Pain Pain Assessment Pain Assessment: Faces Faces  Pain Scale: Hurts even more Pain Location: "everywhere" Pain Descriptors / Indicators: Discomfort, Grimacing, Guarding Pain Intervention(s): Monitored during session, Limited activity within patient's tolerance, Repositioned    Home Living                          Prior Function            PT Goals (current goals can now be  found in the care plan section) Acute Rehab PT Goals Patient Stated Goal: to improve PT Goal Formulation: With patient Time For Goal Achievement: 04/22/22 Potential to Achieve Goals: Fair Progress towards PT goals: Progressing toward goals    Frequency    Min 3X/week      PT Plan Current plan remains appropriate    Co-evaluation              AM-PAC PT "6 Clicks" Mobility   Outcome Measure  Help needed turning from your back to your side while in a flat bed without using bedrails?: A Lot Help needed moving from lying on your back to sitting on the side of a flat bed without using bedrails?: Total Help needed moving to and from a bed to a chair (including a wheelchair)?: Total Help needed standing up from a chair using your arms (e.g., wheelchair or bedside chair)?: Total Help needed to walk in hospital room?: Total Help needed climbing 3-5 steps with a railing? : Total 6 Click Score: 7    End of Session   Activity Tolerance: Patient tolerated treatment well Patient left: in bed;with call bell/phone within reach;with bed alarm set Nurse Communication: Mobility status PT Visit Diagnosis: Unsteadiness on feet (R26.81);Muscle weakness (generalized) (M62.81);Difficulty in walking, not elsewhere classified (R26.2);History of falling (Z91.81);Apraxia (R48.2)     Time: 1245-8099 PT Time Calculation (min) (ACUTE ONLY): 18 min  Charges:  $Therapeutic Activity: 8-22 mins                     Marye Round, PT DPT Acute Rehabilitation Services Pager [phone]  Office [phone]    [name] 04/10/2022, 2:20 PM

## 2022-04-11 NOTE — Progress Notes (Signed)
Handoff report given to [place], Charity fundraiser at [employer].

## 2022-04-11 NOTE — Progress Notes (Signed)
HD#3 Subjective:  Overnight Events: NA  Patient is seen at bedside.  She appears comfortable in no acute distress.  She reports doing well, good appetite and bowel movement.  She denies any abdominal pain or dysuria.  She is eager to go [facility] for [facility].  Objective:  Vital signs in last 24 hours: Vitals:   04/09/22 2106 04/10/22 0809 04/11/22 0456 04/11/22 0743  BP: (!) 145/76 (!) 168/91 (!) 153/71 139/62  Pulse: 81 70 78 60  Resp: 18 16 16 17   Temp: 99.4 F (37.4 C) 97.6 F (36.4 C) 97.8 F (36.6 C) 97.8 F (36.6 C)  TempSrc: Oral Oral Oral Oral  SpO2: 98% 98% 99% 100%  Weight:      Height:       Supplemental O2: Room Air SpO2: 100 %   Physical Exam:  Physical Exam Constitutional:      General: She is not in acute distress.    Appearance: She is not ill-appearing.  HENT:     Head: Normocephalic.  Eyes:     General:        Right eye: No discharge.        Left eye: No discharge.     Conjunctiva/sclera: Conjunctivae normal.  Abdominal:     General: There is no distension.     Palpations: Abdomen is soft.     Tenderness: There is no abdominal tenderness.  Musculoskeletal:     Cervical back: Normal range of motion.  Skin:    General: Skin is warm.  Neurological:     Mental Status: She is alert and oriented to person, place, and time.  Psychiatric:        Mood and Affect: Mood normal.      Filed Weights   04/07/22 0310  Weight: 78 kg     Intake/Output Summary (Last 24 hours) at 04/11/2022 0836 Last data filed at 04/11/2022 0454 Gross per 24 hour  Intake 460 ml  Output 2200 ml  Net -1740 ml   Net IO Since Admission: -2,270 mL [04/11/22 0836]  Pertinent Labs:    Latest Ref Rng & Units 04/09/2022    3:35 AM 04/08/2022    4:15 AM 04/06/2022   10:52 PM  CBC  WBC 4.0 - 10.5 K/uL 9.2  11.9  13.2   Hemoglobin 12.0 - 15.0 g/dL 06/06/2022  33.3  54.5   Hematocrit 36.0 - 46.0 % 36.6  34.6  44.5   Platelets 150 - 400 K/uL 299   309  391        Latest Ref Rng & Units 04/10/2022    3:21 AM 04/09/2022    3:35 AM 04/08/2022    4:15 AM  CMP  Glucose 70 - 99 mg/dL 04/10/2022  638  937   BUN 8 - 23 mg/dL 17  24  15    Creatinine 0.44 - 1.00 mg/dL 342   8.76   Sodium 135 - 145 mmol/L 140  139  140   Potassium 3.5 - 5.1 mmol/L 4.4  4.0  3.2   Chloride 98 - 111 mmol/L 112  108  109   CO2 22 - 32 mmol/L 23  22  23    Calcium 8.9 - 10.3 mg/dL 9.5  9.2  9.2     Imaging: No results found.  Assessment/Plan:   Principal Problem:   Urinary tract infection Active Problems:   Diabetes mellitus type 2 with neurological manifestations (HCC)   Essential hypertension, benign  COPD (chronic obstructive pulmonary disease) (HCC)   Stage 3a chronic kidney disease (HCC)   Constipation   Patient Summary: Jade [name] is a 66 year old female with a past medical history including T2DM with neuropathy, CVA with residual right-sided weakness, COPD, stage IIIa CKD, HTN, and HLD who presents with lower abdominal pain and hematuria was admitted for Klebsiella pneumonia UTI.  Klebsiella pneumonia and Proteus UTI Patient has received 4 days of IV Zosyn.  Will switch to 1 dose of fosfomycin and 3 more days of nitrofurantoin to cover for Klebsiella.  The 4 days of IV Zosyn should cover for the Proteus UTI.   Possible COPD. Patient currently has no PFTs on file, the patient does have a history of smoking 20 years.  Patient has no respiratory symptoms. No inhalers indicated   Hypertension Hyperlipidemia Continue home regimen of amlodipine 10 mg, aspirin 81 mg, Plavix 75 mg and atorvastatin 80 mg.   T2DM with neuropathy A1c 5.7 in January.  Patient currently not on any medications   Chronic Pruritis Flat Affect Patient continues to have a flat affect.  Patient is wearing a glove on her left hand to avoid scratching herself.  Calamine lotion has been helping  Diet: Normal IVF: None,None VTE: NOAC Code: DNR PT/OT recs: SNF for  Subacute PT, none. TOC recs: Pending insurance authorization for SNF  Dispo: Anticipated discharge to Skilled nursing facility in 1 days pending insurance authorization.   Doran Stabler, DO 04/11/2022, 8:36 AM Pager: [phone]  Please contact the on call pager after 5 pm and on weekends at [phone].

## 2022-04-11 NOTE — Discharge Summary (Signed)
Name: Jade [name] MRN: [id number] DOB: December 16, 1955 66 y.o. PCP: Elinor Parkinson  Date of Admission: 04/06/2022 10:16 PM Date of Discharge: 04/11/2022 Attending Physician: Lucious Groves, DO  Discharge Diagnosis: 1. Principal Problem:   Urinary tract infection Active Problems:   Diabetes mellitus type 2 with neurological manifestations (HCC)   Essential hypertension, benign   COPD (chronic obstructive pulmonary disease) (HCC)   Stage 3a chronic kidney disease (HCC)   Constipation   Discharge Medications: Allergies as of 04/11/2022       Reactions   Baclofen Other (See Comments)   Pt said felt weaker and had slurred speech   Nickel Dermatitis, Rash        Medication List     TAKE these medications    acetaminophen 325 MG tablet Commonly known as: TYLENOL Take 2 tablets (650 mg total) by mouth every 6 (six) hours as needed for mild pain, moderate pain, fever or headache (or temp > 37.5 C (99.5 F)). What changed: when to take this   amLODipine 10 MG tablet Commonly known as: NORVASC Take 10 mg by mouth daily.   aspirin EC 81 MG tablet Take 81 mg by mouth daily. Swallow whole.   atorvastatin 80 MG tablet Commonly known as: LIPITOR Take 80 mg by mouth every evening.   cholecalciferol 25 MCG (1000 UNIT) tablet Commonly known as: VITAMIN D3 Take 1,000 Units by mouth daily.   clopidogrel 75 MG tablet Commonly known as: Plavix Take 1 tablet (75 mg total) by mouth daily.   dantrolene 50 MG capsule Commonly known as: DANTRIUM Take 50 mg by mouth at bedtime.   docusate sodium 100 MG capsule Commonly known as: COLACE Take 100 mg by mouth daily.   DRY EYES OP Place 1 drop into both eyes 2 (two) times daily as needed (dry eyes).   feeding supplement Liqd Take 237 mLs by mouth 2 (two) times daily between meals.   nitrofurantoin (macrocrystal-monohydrate) 100 MG capsule Commonly known as: MACROBID Take 1 capsule (100 mg total) by mouth every 12  (twelve) hours for 3 days.   pantoprazole 40 MG tablet Commonly known as: Protonix Take 1 tablet (40 mg total) by mouth 2 (two) times daily. Please take 40 mg oral twice daily x12 weeks, then take 40 mg oral once daily.   POTASSIUM PO Take 3 tablets by mouth daily.   tiZANidine 2 MG tablet Commonly known as: ZANAFLEX Take 1 tablet (2 mg total) by mouth at bedtime as needed for muscle spasms.   ZINC PO Take 1 tablet by mouth daily.        Disposition and follow-up:   Jade [name] was discharged from [facility] in Stable condition.  At the hospital follow up visit please address:  Jade and Proteus [name] -She received 3 days of Zosyn and 1 dose of fosfomycin.  She will finish 3 more days of nitrofurantoin  Patient is on dantrolene at home.  We are unsure of the indication.  Please reassess.  No changes to her home medications was made during this admission  2.  Labs / imaging needed at time of follow-up: NA  3.  Pending labs/ test needing follow-up: BMP  Follow-up Appointments:  Follow-up Information     [facility]. Schedule an appointment as soon as possible for a visit.   Why: Please follow up for PCP follow up after discharge from [facility] facility. Contact information: [address] [place] [address] [phone]        [facility] Preferred SNF Follow up.   Specialty: Skilled Nursing Facility Contact information: [address] [place] [address] [phone]               Follow-up with PCP after leaving SNF  Hospital Course by problem list:  Jade [name] is a 66 year old female with a past medical history including T2DM with neuropathy, CVA with residual right-sided weakness, COPD, stage IIIa CKD, HTN, and HLD who presents with lower abdominal pain and hematuria was admitted for Jade pneumonia  [name].   Jade [name] Originally presented to [facility] in May with positive culture for Jade pneumonia sensitive to imipenem and Zosyn. During that admission she was treated with 5 days of ceftriaxone which was discontinued after culture and sensitivity results we available. She was not started on a new antibiotic due to being asymptomatic at that time. She then went to [facility] ED on 03/17/2022 and was re-cultured and treated with Zosyn and discharged with a prescription for fosfomycin. However, per the son they were not able to pick up the fosfomycin. Over 2 hospital visits she has been treated with 5 days of ceftriaxone and 1 day of Zosyn.   Here UA shows large amount of leukocytes and positive for nitrites with bacteria and white cells present.  Leukocytosis at 13.2.  Lactic acid 0.9 and 1.3.  Low suspicion for pyelonephritis or sepsis. Blood cultures showed no growth throughout admission.  Which treated with 3 days of Zosyn and 1 dose of fosfomycin.  She will continue 3 more days of Macrobid.  Her Proteus UTIs should be covered with 3 days of Zosyn.  HPI Patient is seen at bedside.  She appears comfortable in no acute distress.  She reports doing well, good appetite and bowel movement.  She denies any abdominal pain or dysuria.  She is eager to go [facility] for physical therapy.  Discharge Exam:   BP 139/62 (BP Location: Right Arm)   Pulse 60   Temp 97.8 F (36.6 C) (Oral)   Resp 17   Ht 5\' 2"  (1.575 m)   Wt 78 kg   SpO2 100%   BMI 31.46 kg/m  Discharge exam: Physical Exam Constitutional:      General: She is not in acute distress.    Appearance: She is not ill-appearing.  HENT:     Head: Normocephalic.  Eyes:     General:        Right eye: No discharge.        Left eye: No discharge.     Conjunctiva/sclera: Conjunctivae normal.  Abdominal:     General: There is no distension.     Palpations: Abdomen is soft.     Tenderness:  There is no abdominal tenderness.  Musculoskeletal:     Cervical back: Normal range of motion.  Skin:    General: Skin is warm.  Neurological:     Mental Status: She is alert and oriented to person, place, and time.  Psychiatric:        Mood and Affect: Mood normal.      Pertinent Labs, Studies, and Procedures:     Latest Ref Rng & Units 04/09/2022    3:35 AM 04/08/2022    4:15 AM 04/06/2022   10:52 PM  CBC  WBC 4.0 - 10.5 K/uL 9.2  11.9  13.2  Hemoglobin 12.0 - 15.0 g/dL 11.8  11.8  14.9   Hematocrit 36.0 - 46.0 % 36.6  34.6  44.5   Platelets 150 - 400 K/uL 299  309  391       Latest Ref Rng & Units 04/10/2022    3:21 AM 04/09/2022    3:35 AM 04/08/2022    4:15 AM  CMP  Glucose 70 - 99 mg/dL 121  126  110   BUN 8 - 23 mg/dL 17  24  15    Creatinine 0.44 - 1.00 mg/dL 0.95  1.19  0.98   Sodium 135 - 145 mmol/L 140  139  140   Potassium 3.5 - 5.1 mmol/L 4.4  4.0  3.2   Chloride 98 - 111 mmol/L 112  108  109   CO2 22 - 32 mmol/L 23  22  23    Calcium 8.9 - 10.3 mg/dL 9.5  9.2  9.2    DG ABD ACUTE 2+V W 1V CHEST  Result Date: 04/07/2022 CLINICAL DATA:  Stomach pain. EXAM: DG ABDOMEN ACUTE WITH 1 VIEW CHEST COMPARISON:  March 17, 2022 FINDINGS: There is no evidence of dilated bowel loops or free intraperitoneal air. A large amount of stool is seen within the sigmoid colon. No radiopaque calculi are seen. Radiopaque surgical clips are seen overlying the right upper quadrant. A cluster of adjacent subcentimeter soft tissue calcifications is seen. Heart size and mediastinal contours are within normal limits. Low lung volumes are noted. Both lungs are clear. IMPRESSION: 1. Large amount of stool within the sigmoid colon without evidence of bowel obstruction. 2. No acute cardiopulmonary disease. Electronically Signed   By: Virgina Norfolk M.D.   On: 04/07/2022 03:25     Discharge Instructions: Discharge Instructions     Call MD for:  difficulty breathing, headache or visual disturbances    Complete by: As directed    Call MD for:  extreme fatigue   Complete by: As directed    Call MD for:  hives   Complete by: As directed    Call MD for:  persistant dizziness or light-headedness   Complete by: As directed    Call MD for:  persistant nausea and vomiting   Complete by: As directed    Call MD for:  persistant nausea and vomiting   Complete by: As directed    Call MD for:  redness, tenderness, or signs of infection (pain, swelling, redness, odor or green/yellow discharge around incision site)   Complete by: As directed    Call MD for:  redness, tenderness, or signs of infection (pain, swelling, redness, odor or green/yellow discharge around incision site)   Complete by: As directed    Call MD for:  severe uncontrolled pain   Complete by: As directed    Call MD for:  severe uncontrolled pain   Complete by: As directed    Call MD for:  temperature >100.4   Complete by: As directed    Call MD for:  temperature >100.4   Complete by: As directed    Diet - low sodium heart healthy   Complete by: As directed    Diet - low sodium heart healthy   Complete by: As directed    Discharge instructions   Complete by: As directed    Ms. Mochizuki,  It was a pleasure taking care of you during this admission.  You were hospitalized for abdominal pain, found to have a urinary tract infection.  You have received 3 days  of IV antibiotic.  We will continue 3 more days of oral antibiotics to complete the course.  There was no other changes in your home medications.  You will be doing physical therapy at [facility] rehab facility.  Take care,  Dr. Cyndie Chime   Increase activity slowly   Complete by: As directed    Increase activity slowly   Complete by: As directed        Signed: Belva Agee, MD 04/11/2022, 8:43 AM   Pager: [phone]

## 2022-04-11 NOTE — TOC Transition Note (Signed)
Transition of Care [facility]) - CM/SW Discharge Note   Patient Details  Name: Jade [name] MRN: [id number] Date of Birth: 28-Dec-1955  Transition of Care [facility]) CM/SW Contact:  Janae Bridgeman, RN Phone Number: 04/11/2022, 9:40 AM   Clinical Narrative:    CM spoke with [facility] this morning and patient was approved for SNF placement at [facility].  I updated the Medical physician team and they updated discharge instructions and orders for discharge to the facility this morning.  I called and spoke with Lowella Bandy, CM at the facility and the patient's bed is ready for her admission and transfer to the facility.  Discharge Summary and SNF Transfer form was uploaded in the hub for facility access.  Bedside nursing is aware.  [facility] was called and ambulance transport is scheduled to transfer the patient.  I called and left a message with the patient's family but was unable to leave a voicemail on the phone.  The patient is aware.    Bedside nursing - please call report to [facility] at [phone] - Room 114.  [facility] packet was placed at secretary's desk to include DNR form, updated discharge summary, AVS.    Final next level of care: Skilled Nursing Facility Barriers to Discharge: Continued Medical Work up   Patient Goals and CMS Choice Patient states their goals for this hospitalization and ongoing recovery are:: To go to rehab CMS Medicare.gov Compare Post Acute Care list provided to:: Patient Choice offered to / list presented to : Patient, [name]  Discharge Placement                       Discharge Plan and Services   Discharge Planning Services: CM Consult Post Acute Care Choice: Skilled Nursing Facility                               Social Determinants of Health (SDOH) Interventions     Readmission Risk Interventions    04/09/2022    3:40 PM 06/30/2021    1:48 PM  Readmission Risk Prevention Plan   Transportation Screening Complete Complete  PCP or Specialist Appt within 3-5 Days Complete Complete  HRI or Home Care Consult Complete Complete  Social Work Consult for Recovery Care Planning/Counseling Complete Complete  Palliative Care Screening Complete Not Applicable  Medication Review [employer]) Complete

## 2022-04-12 LAB — CULTURE, BLOOD (ROUTINE X 2)
Culture: NO GROWTH
Culture: NO GROWTH

## 2022-05-04 ENCOUNTER — Ambulatory Visit: Payer: Medicare Other | Admitting: Student

## 2022-05-05 ENCOUNTER — Emergency Department (HOSPITAL_COMMUNITY): Payer: Medicare Other

## 2022-05-05 ENCOUNTER — Inpatient Hospital Stay (HOSPITAL_COMMUNITY)
Admission: EM | Admit: 2022-05-05 | Discharge: 2022-05-12 | DRG: 690 | Disposition: A | Discharge status: Skilled Nursing Facility | Payer: Medicare Other | Attending: Internal Medicine | Admitting: Internal Medicine

## 2022-05-05 DIAGNOSIS — E876 Hypokalemia: Secondary | ICD-10-CM | POA: Diagnosis present

## 2022-05-05 DIAGNOSIS — R109 Unspecified abdominal pain: Secondary | ICD-10-CM

## 2022-05-05 DIAGNOSIS — K219 Gastro-esophageal reflux disease without esophagitis: Secondary | ICD-10-CM | POA: Diagnosis present

## 2022-05-05 DIAGNOSIS — Z8249 Family history of ischemic heart disease and other diseases of the circulatory system: Secondary | ICD-10-CM

## 2022-05-05 DIAGNOSIS — F01518 Vascular dementia, unspecified severity, with other behavioral disturbance: Secondary | ICD-10-CM | POA: Diagnosis present

## 2022-05-05 DIAGNOSIS — Z87440 Personal history of urinary (tract) infections: Secondary | ICD-10-CM

## 2022-05-05 DIAGNOSIS — Z7902 Long term (current) use of antithrombotics/antiplatelets: Secondary | ICD-10-CM

## 2022-05-05 DIAGNOSIS — N390 Urinary tract infection, site not specified: Secondary | ICD-10-CM | POA: Diagnosis not present

## 2022-05-05 DIAGNOSIS — Z1624 Resistance to multiple antibiotics: Secondary | ICD-10-CM | POA: Diagnosis present

## 2022-05-05 DIAGNOSIS — N1831 Chronic kidney disease, stage 3a: Secondary | ICD-10-CM | POA: Diagnosis present

## 2022-05-05 DIAGNOSIS — N179 Acute kidney failure, unspecified: Secondary | ICD-10-CM | POA: Diagnosis not present

## 2022-05-05 DIAGNOSIS — G9340 Encephalopathy, unspecified: Secondary | ICD-10-CM

## 2022-05-05 DIAGNOSIS — Z888 Allergy status to other drugs, medicaments and biological substances status: Secondary | ICD-10-CM

## 2022-05-05 DIAGNOSIS — N3000 Acute cystitis without hematuria: Principal | ICD-10-CM

## 2022-05-05 DIAGNOSIS — E785 Hyperlipidemia, unspecified: Secondary | ICD-10-CM | POA: Diagnosis present

## 2022-05-05 DIAGNOSIS — I444 Left anterior fascicular block: Secondary | ICD-10-CM | POA: Diagnosis present

## 2022-05-05 DIAGNOSIS — Z87891 Personal history of nicotine dependence: Secondary | ICD-10-CM

## 2022-05-05 DIAGNOSIS — Z91048 Other nonmedicinal substance allergy status: Secondary | ICD-10-CM

## 2022-05-05 DIAGNOSIS — E1122 Type 2 diabetes mellitus with diabetic chronic kidney disease: Secondary | ICD-10-CM | POA: Diagnosis present

## 2022-05-05 DIAGNOSIS — B961 Klebsiella pneumoniae [K. pneumoniae] as the cause of diseases classified elsewhere: Secondary | ICD-10-CM | POA: Diagnosis present

## 2022-05-05 DIAGNOSIS — F05 Delirium due to known physiological condition: Secondary | ICD-10-CM | POA: Diagnosis not present

## 2022-05-05 DIAGNOSIS — I129 Hypertensive chronic kidney disease with stage 1 through stage 4 chronic kidney disease, or unspecified chronic kidney disease: Secondary | ICD-10-CM | POA: Diagnosis present

## 2022-05-05 DIAGNOSIS — G9349 Other encephalopathy: Secondary | ICD-10-CM | POA: Diagnosis present

## 2022-05-05 DIAGNOSIS — Z79899 Other long term (current) drug therapy: Secondary | ICD-10-CM

## 2022-05-05 DIAGNOSIS — I69351 Hemiplegia and hemiparesis following cerebral infarction affecting right dominant side: Secondary | ICD-10-CM

## 2022-05-05 DIAGNOSIS — E1140 Type 2 diabetes mellitus with diabetic neuropathy, unspecified: Secondary | ICD-10-CM | POA: Diagnosis present

## 2022-05-05 DIAGNOSIS — Z66 Do not resuscitate: Secondary | ICD-10-CM | POA: Diagnosis present

## 2022-05-05 DIAGNOSIS — M62838 Other muscle spasm: Secondary | ICD-10-CM | POA: Diagnosis present

## 2022-05-05 DIAGNOSIS — Z811 Family history of alcohol abuse and dependence: Secondary | ICD-10-CM

## 2022-05-05 DIAGNOSIS — Z806 Family history of leukemia: Secondary | ICD-10-CM

## 2022-05-05 DIAGNOSIS — Z7982 Long term (current) use of aspirin: Secondary | ICD-10-CM

## 2022-05-05 LAB — URINALYSIS, ROUTINE W REFLEX MICROSCOPIC
Bilirubin Urine: NEGATIVE
Glucose, UA: NEGATIVE mg/dL
Hgb urine dipstick: NEGATIVE
Ketones, ur: NEGATIVE mg/dL
Nitrite: POSITIVE — AB
Protein, ur: NEGATIVE mg/dL
Specific Gravity, Urine: 1.01 (ref 1.005–1.030)
WBC, UA: >50 WBC/hpf — ABNORMAL HIGH (ref 0–5)
pH: 6 (ref 5.0–8.0)

## 2022-05-05 LAB — COMPREHENSIVE METABOLIC PANEL
ALT: 18 U/L (ref 0–44)
AST: 27 U/L (ref 15–41)
Albumin: 4 g/dL (ref 3.5–5.0)
Alkaline Phosphatase: 83 U/L (ref 38–126)
Anion gap: 12 (ref 5–15)
BUN: 16 mg/dL (ref 8–23)
CO2: 21 mmol/L — ABNORMAL LOW (ref 22–32)
Calcium: 10.1 mg/dL (ref 8.9–10.3)
Chloride: 106 mmol/L (ref 98–111)
Creatinine, Ser: 0.81 mg/dL (ref 0.44–1.00)
GFR, Estimated: >60 mL/min (ref >60)
Glucose, Bld: 93 mg/dL (ref 70–99)
Potassium: 3.8 mmol/L (ref 3.5–5.1)
Sodium: 139 mmol/L (ref 135–145)
Total Bilirubin: 0.4 mg/dL (ref 0.3–1.2)
Total Protein: 7.4 g/dL (ref 6.5–8.1)

## 2022-05-05 LAB — CBC WITH DIFFERENTIAL/PLATELET
Abs Immature Granulocytes: 0.02 10*3/uL (ref 0.00–0.07)
Basophils Absolute: 0.1 10*3/uL (ref 0.0–0.1)
Basophils Relative: 1 %
Eosinophils Absolute: 0.2 10*3/uL (ref 0.0–0.5)
Eosinophils Relative: 2 %
HCT: 46.8 % — ABNORMAL HIGH (ref 36.0–46.0)
Hemoglobin: 15.5 g/dL — ABNORMAL HIGH (ref 12.0–15.0)
Immature Granulocytes: 0 %
Lymphocytes Relative: 26 %
Lymphs Abs: 2 10*3/uL (ref 0.7–4.0)
MCH: 32.2 pg (ref 26.0–34.0)
MCHC: 33.1 g/dL (ref 30.0–36.0)
MCV: 97.1 fL (ref 80.0–100.0)
Monocytes Absolute: 0.7 10*3/uL (ref 0.1–1.0)
Monocytes Relative: 9 %
Neutro Abs: 4.9 10*3/uL (ref 1.7–7.7)
Neutrophils Relative %: 62 %
Platelets: 364 10*3/uL (ref 150–400)
RBC: 4.82 MIL/uL (ref 3.87–5.11)
RDW: 13.2 % (ref 11.5–15.5)
WBC: 7.9 10*3/uL (ref 4.0–10.5)
nRBC: 0 % (ref 0.0–0.2)

## 2022-05-05 LAB — LACTIC ACID, PLASMA
Lactic Acid, Venous: 1.6 mmol/L (ref 0.5–1.9)
Lactic Acid, Venous: 1.2 mmol/L (ref 0.5–1.9)

## 2022-05-05 MED ORDER — RIVAROXABAN 10 MG PO TABS
10.0000 mg | ORAL_TABLET | Freq: Every day | ORAL | Status: DC
  Administered 2022-05-06 – 2022-05-11 (×6): 10 mg via ORAL
  Filled 2022-05-05 (×6): qty 1

## 2022-05-05 MED ORDER — PIPERACILLIN-TAZOBACTAM 3.375 G IVPB
3.3750 g | Freq: Three times a day (TID) | INTRAVENOUS | Status: AC
  Administered 2022-05-06 – 2022-05-11 (×17): 3.375 g via INTRAVENOUS
  Filled 2022-05-05 (×18): qty 50

## 2022-05-05 NOTE — Progress Notes (Signed)
Pharmacy Antibiotic Note  Jade [name] is a 66 y.o. female admitted on 05/05/2022 with recurrent UTI. Pharmacy has been consulted for Zosyn dosing. Cr <1.  Plan: Zosyn 3.375g EI q8h Follow urine culture     Temp (24hrs), Avg:98.8 F (37.1 C), Min:98.8 F (37.1 C), Max:98.8 F (37.1 C)  Recent Labs  Lab 05/05/22 1923  WBC 7.9  CREATININE 0.81  LATICACIDVEN 1.6    CrCl cannot be calculated (Unknown ideal weight.).    Allergies  Allergen Reactions   Baclofen Other (See Comments)    Pt said felt weaker and had slurred speech   Nickel Dermatitis and Rash    Fredonia Highland, PharmD, BCPS, [facility] Clinical Pharmacist Please check AMION for all [facility] Pharmacy numbers 05/05/2022

## 2022-05-05 NOTE — ED Provider Notes (Signed)
[facility] EMERGENCY DEPARTMENT Provider Note   CSN: [id number] Arrival date & time: 05/05/22  1827    History  Chief Complaint  Patient presents with   Urinary Tract Infection    Jade [name] is a 66 y.o. 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 Family noted over the last 5 days ,confusion and intermittent agitation. Tested her urine (home health aid) which was positive for nitrite and leuks. She has had right flank pain, Nausea, hematuria, subpubic pain and dysuria x 1 week. No fever, emesis, change in BM,   Last admission 8/12-8/16 with IM service  Urine grew  Klebsiella pneumoniae as well as Proteus Mirabilis last admission 04/06/22 Both sensitive to Cipro, Imipenem, Zosyn  Last 2 prior  admission Micro only sensitive to Zosyn and Imipenem    HPI     Home Medications Prior to Admission medications   Medication Sig Start Date End Date Taking? Authorizing Provider  acetaminophen (TYLENOL) 325 MG tablet Take 2 tablets (650 mg total) by mouth every 6 (six) hours as needed for mild pain, moderate pain, fever or headache (or temp > 37.5 C (99.5 F)). Patient taking differently: Take 650 mg by mouth at bedtime. 09/22/21   Elgergawy, Silver Huguenin, MD  amLODipine (NORVASC) 10 MG tablet Take 10 mg by mouth daily. 08/29/21   [provider]  Artificial Tear Ointment (DRY EYES OP) Place 1 drop into both eyes 2 (two) times daily as needed (dry eyes).    [provider]  aspirin EC 81 MG tablet Take 81 mg by mouth daily. Swallow whole.    [provider]  atorvastatin (LIPITOR) 80 MG tablet Take 80 mg by mouth every evening.    [provider]  cholecalciferol (VITAMIN D3) 25 MCG (1000 UNIT) tablet Take 1,000 Units by mouth daily.    [provider]  clopidogrel (PLAVIX) 75 MG tablet Take 1 tablet (75 mg  total) by mouth daily. 01/11/22 01/11/23  Gerrit Heck, MD  dantrolene (DANTRIUM) 50 MG capsule Take 50 mg by mouth at bedtime. 03/02/22   [provider]  docusate sodium (COLACE) 100 MG capsule Take 100 mg by mouth daily.    [provider]  feeding supplement (ENSURE ENLIVE / ENSURE PLUS) LIQD Take 237 mLs by mouth 2 (two) times daily between meals. 01/11/22   Gerrit Heck, MD  Multiple Vitamins-Minerals (ZINC PO) Take 1 tablet by mouth daily.    [provider]  pantoprazole (PROTONIX) 40 MG tablet Take 1 tablet (40 mg total) by mouth 2 (two) times daily. Please take 40 mg oral twice daily x12 weeks, then take 40 mg oral once daily. 09/22/21   Elgergawy, Silver Huguenin, MD  POTASSIUM PO Take 3 tablets by mouth daily.    [provider]  tiZANidine (ZANAFLEX) 2 MG tablet Take 1 tablet (2 mg total) by mouth at bedtime as needed for muscle spasms. 01/11/22   Gerrit Heck, MD      Allergies    Baclofen and Nickel    Review of Systems   Review of Systems  Constitutional:  Positive for activity change.  HENT: Negative.    Respiratory: Negative.    Cardiovascular: Negative.   Gastrointestinal:  Positive for nausea. Negative for diarrhea, rectal pain and vomiting.  Genitourinary:  Positive for dysuria, flank pain, frequency and hematuria.  Skin: Negative.   Neurological:  Positive for  weakness (right sided, chronic). Negative for tremors (generalized).  All other systems reviewed and are negative.   Physical Exam Updated Vital Signs BP (!) 140/66   Pulse 63   Temp 98.8 F (37.1 C) (Oral)   Resp 16   SpO2 97%  Physical Exam Vitals and nursing note reviewed.  Constitutional:      General: She is not in acute distress.    Appearance: She is well-developed. She is not ill-appearing, toxic-appearing or diaphoretic.  HENT:     Head: Normocephalic and atraumatic.  Eyes:     Pupils: Pupils are equal, round, and reactive to light.  Cardiovascular:      Rate and Rhythm: Normal rate.     Pulses: Normal pulses.     Heart sounds: Normal heart sounds.  Pulmonary:     Effort: Pulmonary effort is normal. No respiratory distress.     Breath sounds: Normal breath sounds.  Abdominal:     General: Bowel sounds are normal. There is no distension.     Palpations: Abdomen is soft.     Tenderness: There is abdominal tenderness.     Comments: Mild tenderness suprapubic region  Musculoskeletal:        General: Normal range of motion.     Cervical back: Normal range of motion.     Comments: Right Upper and lower extremity weakness, chronic  Skin:    General: Skin is warm and dry.  Neurological:     Mental Status: She is alert.     Motor: Weakness present.     Comments: Right upper and lower extremity weakness Minimally ambulatory at baseline Alert to person, place, states year is 2002  Psychiatric:        Mood and Affect: Mood normal.    ED Results / Procedures / Treatments   Labs (all labs ordered are listed, but only abnormal results are displayed) Labs Reviewed  CBC WITH DIFFERENTIAL/PLATELET - Abnormal; Notable for the following components:      Result Value   Hemoglobin 15.5 (*)    HCT 46.8 (*)    All other components within normal limits  COMPREHENSIVE METABOLIC PANEL - Abnormal; Notable for the following components:   CO2 21 (*)    All other components within normal limits  URINALYSIS, ROUTINE W REFLEX MICROSCOPIC - Abnormal; Notable for the following components:   APPearance HAZY (*)    Nitrite POSITIVE (*)    Leukocytes,Ua LARGE (*)    WBC, UA >50 (*)    Bacteria, UA RARE (*)    All other components within normal limits  URINE CULTURE  CULTURE, BLOOD (ROUTINE X 2)  CULTURE, BLOOD (ROUTINE X 2)  LACTIC ACID, PLASMA  LACTIC ACID, PLASMA    EKG None  Radiology CT Renal Stone Study  Result Date: 05/05/2022 CLINICAL DATA:  Recurrent UTIs. EXAM: CT ABDOMEN AND PELVIS WITHOUT CONTRAST TECHNIQUE: Multidetector CT imaging of  the abdomen and pelvis was performed following the standard protocol without IV contrast. RADIATION DOSE REDUCTION: This exam was performed according to the departmental dose-optimization program which includes automated exposure control, adjustment of the mA and/or kV according to patient size and/or use of iterative reconstruction technique. COMPARISON:  February 15, 2022 FINDINGS: Lower chest: Mild bibasilar linear scarring and/or atelectasis is seen. Hepatobiliary: No focal liver abnormality is seen. Multiple gallstones are seen within the gallbladder fossa. These are present on the prior study. The gallbladder is not clearly identified. Pancreas: Unremarkable. No pancreatic ductal dilatation or surrounding inflammatory changes. Spleen:  Normal in size without focal abnormality. Adrenals/Urinary Tract: Adrenal glands are unremarkable. Kidneys are normal, without obstructing renal calculi, focal lesion, or hydronephrosis. 2 mm calcifications are seen within the mid left kidney which may be vascular in origin. Bladder is unremarkable. Stomach/Bowel: Stomach is within normal limits. Appendix appears normal. No evidence of bowel wall thickening, distention, or inflammatory changes. Noninflamed diverticula are seen within the descending and sigmoid colon. Vascular/Lymphatic: Aortic atherosclerosis. No enlarged abdominal or pelvic lymph nodes. Reproductive: Uterus and bilateral adnexa are unremarkable. Other: Surgical mesh is seen along the anterior aspect of the lower abdomen and upper pelvis. No abdominal wall hernia or abnormality. No abdominopelvic ascites. Musculoskeletal: Chronic compression fracture deformities are seen at the levels of T12 and L1. Multilevel degenerative changes are noted throughout the lumbar spine. IMPRESSION: 1. Cholelithiasis. 2. Colonic diverticulosis. 3. Chronic compression fracture deformities of the T12 and L1 vertebral bodies. 4. Aortic atherosclerosis. Aortic Atherosclerosis  (ICD10-I70.0). Electronically Signed   By: Aram Candela M.D.   On: 05/05/2022 20:42   CT HEAD WO CONTRAST ( )  Result Date: 05/05/2022 CLINICAL DATA:  Altered mental status. EXAM: CT HEAD WITHOUT CONTRAST TECHNIQUE: Contiguous axial images were obtained from the base of the skull through the vertex without intravenous contrast. RADIATION DOSE REDUCTION: This exam was performed according to the departmental dose-optimization program which includes automated exposure control, adjustment of the mA and/or kV according to patient size and/or use of iterative reconstruction technique. COMPARISON:  March 17, 2022 FINDINGS: Brain: There is mild cerebral atrophy with widening of the extra-axial spaces and ventricular dilatation. There are areas of decreased attenuation within the white matter tracts of the supratentorial brain, consistent with microvascular disease changes. Chronic bilateral occipital lobe and chronic bilateral parietal lobe infarcts are seen. Vascular: No hyperdense vessel or unexpected calcification. Skull: Normal. Negative for fracture or focal lesion. Sinuses/Orbits: There is mild to moderate severity sphenoid sinus mucosal thickening. Other: None. IMPRESSION: 1. Generalized cerebral atrophy and chronic white matter small vessel ischemic changes buttons of an acute intracranial abnormality. 2. Chronic bilateral occipital lobe and chronic bilateral parietal lobe infarcts. Electronically Signed   By: Aram Candela M.D.   On: 05/05/2022 20:37    Procedures Procedures    Medications Ordered in ED Medications  piperacillin-tazobactam (ZOSYN) IVPB 3.375 g (has no administration in time range)    ED Course/ Medical Decision Making/ A&P    66 year old with history of prior CVA with right-sided deficits, recurrent multidrug-resistant UTI here for evaluation of confusion positive UTI at home.  Has home health aide.  Has had 1 week of generalized weakness, dysuria, abdominal pain, nausea.   Family thought patient seemed more confused than her baseline.  Has had intermittent agitation.  Patient pleasant in room, no agitation.  Does think the year is 2002.  Some chronic right-sided deficits.  Mild suprapubic tenderness, negative CVA tap bilaterally.  Per admission approximately 3 weeks ago with Proteus and Klebsiella.  Sensitive to Cipro, imipenem and Zosyn, prior urines only sensitive to imipenem and Zosyn  Labs and imaging personally viewed and interpreted:  CBC without leukocytosis CMP without significant finding Lactic pending UA positive for UTI CT stone without acute findings CT head without acute findings  Reassessed.  Will admit for antibiotics.  Low suspicion for sepsis at this time.  Attempted to contact emergency contact, husband in epic x2 without answer.  CONSULT with Dr. Sloan Leiter with IM teaching who is agreeable to evaluate patient for admission.  Discussed plan with patient.  She  is agreeable.  The patient appears reasonably stabilized for admission considering the current resources, flow, and capabilities available in the ED at this time, and I doubt any other [facility] requiring further screening and/or treatment in the ED prior to admission.                            Medical Decision Making Amount and/or Complexity of Data Reviewed Independent Historian: EMS External Data Reviewed: labs, radiology, ECG and notes. Labs: ordered. Decision-making details documented in ED Course. Radiology: ordered and independent interpretation performed. Decision-making details documented in ED Course. ECG/medicine tests: ordered and independent interpretation performed. Decision-making details documented in ED Course.  Risk OTC drugs. Prescription drug management. Parenteral controlled substances. Decision regarding hospitalization. Diagnosis or treatment significantly limited by social determinants of health.         Final Clinical Impression(s) / ED  Diagnoses Final diagnoses:  Acute cystitis without hematuria  Acute confusional state  Flank pain    Rx / DC Orders ED Discharge Orders     None         Shacara Cozine A, PA-C 05/05/22 [id number]    Sloan Leiter, DO 05/07/22 0006

## 2022-05-05 NOTE — H&P (Addendum)
Date: 05/06/2022               Patient Name:  Jade [name] MRN: [id number]  DOB: [birth year] Age / Sex: 66 y.o., female   PCP: Nathaneil Canary, PA-C         Medical Service: Internal Medicine Teaching Service         Attending Physician: Dr. Inez Catalina, MD    First Contact: Christia Reading, MD      Pager: [phone]      Second Contact: Sharrell Ku, MD      Pager: [phone]           After Hours (After 5p/  First Contact Pager: [phone]  weekends / holidays): Second Contact Pager: [phone]   SUBJECTIVE   Chief Complaint: AMS  History of Present Illness: Jade [name] is a 65 year old female with past medical history of T2DM with neuropathy, HLD, HTN, CKD 3, multiple prior CVA and carotid stenosis, recurrent UTIs presenting with concern for UTI.  Patient has been admitted at [facility] multiple times in the past few months for multidrug-resistant Klebsiella and Proteus UTI.  She was most recently discharged on 8/16 after completing 3 days of Zosyn and 1 dose of fosfomycin and then completing 3 more days of Macrobid after discharge.  Her UTI should have been covered by the 3 days of Zosyn that she got during that admission.  She was discharged to [facility] for rehab and PT and was there for about 10 days.  After that she went home and did not have any symptoms for a few days.  She states that last week she started having suprapubic pain, dysuria, but no changes in urinary frequency or hematuria.  She says that lasted for couple days, but has cleared up now.  She is primarily here at this time because her family members were concerned that she was more confused and agitated yesterday and today, and previously they have associated that with her UTIs.  Spoke with her son, who provided the example that yesterday the patient had become agitated and yelled at his wife, which he says she would never do normally.  He says that the previous times she had these behavioral  changes and episodes of confusion, she had a UTI.  This is her fourth admission for UTI over the span of a few months.  She denies any dyspnea, cough, chest pain, vomiting or diarrhea.  Of note, she has had significant functional deficits following her infarcts earlier this year.  She has persistent right-sided hemiparesis and still is not able to ambulate independently.  Her son and husband manage many of her ADLs and IADLs and her son is her healthcare POA.  She has previously seen palliative care and she is DNR.   ED Course: Diagnostic work-up below, see ED provider note for full assessment and plan.  Of note, she received a dose of Zosyn in the ED.  Meds:  No outpatient medications have been marked as taking for the 05/05/22 encounter [facility] Encounter).  Meds need to be reconciled with patient's family members. Muscle spasms (right hand): Zanaflex 2 mg nightly as needed, dantrolene 50 mg nightly (previous concern for dantrolene causing bone marrow suppression in chart) HTN: Amlodipine 10 mg daily HLD: Lipitor 80 mg daily CVAs: DAPT (can likely switch to monotherapy at this time as her infarct was in late January of this year) GERD: Protonix 40 mg daily   Past Medical History  Past Surgical History:  Procedure Laterality Date   BIOPSY  09/20/2021   Procedure: BIOPSY;  Surgeon: Tressia Danas, MD;  Location: [facility] ENDOSCOPY;  Service: Gastroenterology;;   ESOPHAGOGASTRODUODENOSCOPY (EGD) WITH PROPOFOL N/A 09/20/2021   Procedure: ESOPHAGOGASTRODUODENOSCOPY (EGD) WITH PROPOFOL;  Surgeon: Tressia Danas, MD;  Location: [facility] ENDOSCOPY;  Service: Gastroenterology;  Laterality: N/A;   HERNIA REPAIR     LAPAROSCOPIC CHOLECYSTECTOMY  06/2007   "no UHR w/this" (03/21/2018)   TONSILLECTOMY     TRANSCAROTID ARTERY REVASCULARIZATION  Left 04/06/2021   Procedure: LEFT TRANSCAROTID ARTERY REVASCULARIZATION;  Surgeon: Leonie Douglas, MD;  Location: [facility] OR;  Service: Vascular;  Laterality: Left;    ULTRASOUND GUIDANCE FOR VASCULAR ACCESS Right 04/06/2021   Procedure: ULTRASOUND GUIDANCE FOR VASCULAR ACCESS;  Surgeon: Leonie Douglas, MD;  Location: [facility] OR;  Service: Vascular;  Laterality: Right;   UMBILICAL HERNIA REPAIR  2009    Social:  Lives With: Son and husband Occupation: Support: Family support as above.  Was previously at [facility] for rehab and PT.  Is being followed up by home health PT/OT.  Also follows with palliative care in the outpatient setting. Level of Function: Significant functional deficits as in HPI.  States that she is not able to ambulate around her house even with walker support.  Needs assistance with bathing, dressing.  Family manages her medications.  Significant memory deficits. PCP: Romie Jumper, PA.  [facility] Substances: Former 20-pack-year smoker.  Previous marijuana use.  Currently not using alcohol or any other substances.  Family History:  Family History  Problem Relation Age of Onset   Heart disease Father    Leukemia Father    Alcohol abuse Son    Other Mother    Cancer Neg Hx    Diabetes Neg Hx    Hearing loss Neg Hx    Hyperlipidemia Neg Hx    Hypertension Neg Hx    Kidney disease Neg Hx    Stroke Neg Hx      Allergies: Allergies as of 05/05/2022 - Review Complete 04/07/2022  Allergen Reaction Noted   Baclofen Other (See Comments) 04/23/2021   Nickel Dermatitis and Rash 03/21/2018    Review of Systems: A complete ROS was negative except as per HPI.   OBJECTIVE:   Physical Exam: Blood pressure (!) 150/81, pulse 63, temperature 98.2 F (36.8 C), temperature source Oral, resp. rate 16, SpO2 98 %.  Constitutional: Elderly appearing, in no acute distress.  Interactive and answers questions. HEENT: Multiple erythematous scalp lesions (see media tab) Cardiovascular: RRR, no murmurs, rubs or gallops Pulmonary/Chest: normal work of breathing on room air, lungs clear to auscultation bilaterally Abdominal: soft, minimal tenderness in  suprapubic region, non-distended Extremities: warm, well perfused, extremity pulses 2+, no BLE pitting edema Neuro: Alert and oriented x2 (not to year).  4/5 strength left extremities, 3/5 strength right extremities.  Labs:    Latest Ref Rng & Units 05/05/2022    7:23 PM 04/09/2022    3:35 AM 04/08/2022    4:15 AM 04/06/2022   10:52 PM 03/17/2022    3:38 PM 01/10/2022   12:55 AM 01/09/2022    1:15 AM  CBC EXTENDED  WBC 4.0 - 10.5 K/uL 7.9  9.2  11.9  13.2  7.6  9.8  10.4   RBC 3.87 - 5.11 MIL/uL 4.82  3.74  3.64  4.63  4.16  4.17  4.26   Hemoglobin 12.0 - 15.0 g/dL 30.0  76.2  26.3  33.5  13.4  11.8  12.2  HCT 36.0 - 46.0 % 46.8  36.6  34.6  44.5  39.0  36.9  37.4   Platelets 150 - 400 K/uL 364  299  309  391  346  340  358   NEUT# 1.7 - 7.7 K/uL 4.9    10.4  5.1     Lymph# 0.7 - 4.0 K/uL 2.0    1.7  1.7         Latest Ref Rng & Units 05/05/2022    7:23 PM 04/10/2022    3:21 AM 04/09/2022    3:35 AM 04/08/2022    4:15 AM 04/06/2022   10:52 PM 03/17/2022    3:38 PM 01/11/2022    1:01 AM  CMP  Glucose 70 - 99 mg/dL 93  124  580  998  338  153  119   BUN 8 - 23 mg/dL 16  17  24  15  21  12  15    Creatinine 0.44 - 1.00 mg/dL  2.50  5.39  7.67  0.97  0.66  0.89   Sodium 135 - 145 mmol/L 139  140  139  140  139  138  138   Potassium 3.5 - 5.1 mmol/L 3.8  4.4  4.0  3.2  4.0  3.9  3.7   Chloride 98 - 111 mmol/L 106  112  108  109  108  107  107   CO2 22 - 32 mmol/L 21  23  22  23  21  22  20    Calcium 8.9 - 10.3 mg/dL 3.41  9.5  9.2  9.2  9.8  9.4  9.3   Total Protein 6.5 - 8.1 g/dL 7.4     7.2  6.9    Total Bilirubin 0.3 - 1.2 mg/dL 0.4     0.8  0.6    Alkaline Phos 38 - 126 U/L 83     79  75    AST 15 - 41 U/L 27     25  29     ALT 0 - 44 U/L 18     16  15     Lactic acid 1.6, 1.2 UA showing positive nitrites, large leukocytes, > 50 white blood cells, rare bacteria (similar to multiple prior UAs) Blood cultures, urine cultures pending   Imaging: CT head: Generalized cerebral  atrophy, extensive chronic white matter microvascular changes, extensive encephalomalacia and sequelae of remote bilateral parieto-occipital infarcts  CT renal stone study: No visible calculi.  No hydronephrosis.  Other incidental findings as below: 1. Patient is s/p cholecystectomy and umbilical hernia repair 2. Colonic diverticulosis. 3. Chronic compression fracture deformities of the T12 and L1 vertebral bodies. 4. Aortic atherosclerosis.  EKG: personally reviewed my interpretation is sinus rhythm with QRS widening.  Similar to prior EKG 8/14.  ASSESSMENT & PLAN:  KEILYN HAGGARD is a 66 y.o. female with PMH T2DM with neuropathy, muscle spasticity, HLD, HTN, CKD 3, multiple prior CVA and carotid stenosis, recurrent UTIs  who presented with concern for AMS, suprapubic pain and admitted for UTI on hospital day 0  #Encephalopathy #UTI with history of recurrent multidrug-resistant Klebsiella/Proteus UTI #Multiple prior infarcts #Carotid stenosis s/p revascularization Patient with several recent admissions for treatment of multidrug-resistant Klebsiella/Proteus UTI, most recently discharged on 8/16.  Presents again with possible UTI, though she endorses that her symptoms are improving.  Family is concerned that she has episodes of worsening encephalopathy which is associated with UTI.  Given she does have suprapubic tenderness,  endorses dysuria in the past week and UA findings concerning for UTI, would treat with a course of Zosyn (to cover her resistances).  However, would consider whether her memory deficits, behavioral changes, and general functional deficits are her current baseline status due to her extensive encephalomalacia and history of multiple previous infarcts.  Low concern for acute infarct at this time. - Continue IV Zosyn - Follow-up urine/blood cultures.  Trend CBC - Consider neurology/palliative follow-up if patient's mentation does not improve.  Suspect vascular dementia. -  Patient still on DAPT, infarcts were almost 8 months ago, but patient was not able to follow-up with neurology.  Can transition to monotherapy at this time.  #HTN Slightly hypertensive in ED - Continue home amlodipine 10 mg daily  #CKD 3 -Trend BMP  #HLD: Continue Lipitor 80.  Follow-up lipid profile.  Goal LDL < 70.  #T2DM with neuropathy #Muscle spasticity Last A1c 5.7 on 09/17/2021.  Patient taking Zanaflex nightly as needed and dantrolene to help with right hand muscle spasticity.  Previous documentation in chart with concern for bone marrow suppression caused by dantrolene.  We will hold at this time. - Follow-up A1c - CBGs ACHS - Continue tizanidine 2 mg nightly as needed.  Holding dantrolene at this time, would recommend discontinuation if patient is able to control symptoms on just tizanidine.  GERD: Continue Protonix 40 daily.  Diet: Carb-Modified VTE:  Xarelto IVF: None,None Code: DNR  Prior to Admission Living Arrangement: Home, living with husband and son Anticipated Discharge Location: Home Barriers to Discharge: Symptomatic resolution  Dispo: Admit patient to Observation with expected length of stay less than 2 midnights.  Signed: Lyndle Herrlich, MD Internal Medicine Resident PGY-1  05/06/2022, 1:29 AM

## 2022-05-05 NOTE — Hospital Course (Addendum)
Hx of multdrug resistant UTI, CVA Discahrged 8/16.  Follows with palliative, son poa she is DNR   4th admission for UTI  5 days of AMS  Right flank pain, dysuria last week, no change in frequency No SOB Low back pain No cough  Belly pain that is getting better over last few days + nausea no v  UA Prior UTI klebsiella with proteus  Social: Son POA Ex-Husband non english speaking Ex-husband lives with them too Uses walker Son takes care of medications Quit smoking about 20 years ago No alcohol use  HTN BP goal 130-150 given advanced atherosclerosis and stenosis  CVA Stroke 1/23 affecting vision Hgb drop in1/23 leading to watershed infarcts. She has not followed up with neuro  09/11 Wooozyness and decreased appetite for the last week. Nothing sounds appetizing to the patient. Patient urinary symptoms are persistent. She thinks its 2014 and in July. Knows she is in [place] and knows that he is here because of UTIs.  Leg stiffness is horrible AxO2?  Uro appt: 9/22 9:30 am  Dr. Liliane Shi

## 2022-05-05 NOTE — ED Triage Notes (Signed)
Pt BIB EMS after having positive leukocytes and nitrates in her UA. Patient has been in and out of the hospital for recurrent UTIs. Family and home health aid state that pt has  astrict regimen of changing and cleaning in order to prevent UTI's and are unsure of why she keeps getting them. Family has stated that patient has been agitated and irritable with disorientation.   VSS except for a BP of 172/98.

## 2022-05-06 ENCOUNTER — Other Ambulatory Visit: Payer: Self-pay

## 2022-05-06 ENCOUNTER — Encounter (HOSPITAL_COMMUNITY): Payer: Self-pay | Admitting: Internal Medicine

## 2022-05-06 DIAGNOSIS — Z91048 Other nonmedicinal substance allergy status: Secondary | ICD-10-CM | POA: Diagnosis not present

## 2022-05-06 DIAGNOSIS — F05 Delirium due to known physiological condition: Secondary | ICD-10-CM | POA: Diagnosis present

## 2022-05-06 DIAGNOSIS — Z811 Family history of alcohol abuse and dependence: Secondary | ICD-10-CM | POA: Diagnosis not present

## 2022-05-06 DIAGNOSIS — N179 Acute kidney failure, unspecified: Secondary | ICD-10-CM | POA: Diagnosis not present

## 2022-05-06 DIAGNOSIS — N1831 Chronic kidney disease, stage 3a: Secondary | ICD-10-CM | POA: Diagnosis present

## 2022-05-06 DIAGNOSIS — N390 Urinary tract infection, site not specified: Secondary | ICD-10-CM | POA: Diagnosis present

## 2022-05-06 DIAGNOSIS — B964 Proteus (mirabilis) (morganii) as the cause of diseases classified elsewhere: Secondary | ICD-10-CM | POA: Diagnosis not present

## 2022-05-06 DIAGNOSIS — K219 Gastro-esophageal reflux disease without esophagitis: Secondary | ICD-10-CM | POA: Diagnosis present

## 2022-05-06 DIAGNOSIS — Z1624 Resistance to multiple antibiotics: Secondary | ICD-10-CM | POA: Diagnosis present

## 2022-05-06 DIAGNOSIS — M62838 Other muscle spasm: Secondary | ICD-10-CM | POA: Diagnosis present

## 2022-05-06 DIAGNOSIS — B961 Klebsiella pneumoniae [K. pneumoniae] as the cause of diseases classified elsewhere: Secondary | ICD-10-CM | POA: Diagnosis present

## 2022-05-06 DIAGNOSIS — Z87440 Personal history of urinary (tract) infections: Secondary | ICD-10-CM | POA: Diagnosis not present

## 2022-05-06 DIAGNOSIS — I69351 Hemiplegia and hemiparesis following cerebral infarction affecting right dominant side: Secondary | ICD-10-CM | POA: Diagnosis not present

## 2022-05-06 DIAGNOSIS — E785 Hyperlipidemia, unspecified: Secondary | ICD-10-CM | POA: Diagnosis present

## 2022-05-06 DIAGNOSIS — E1122 Type 2 diabetes mellitus with diabetic chronic kidney disease: Secondary | ICD-10-CM | POA: Diagnosis present

## 2022-05-06 DIAGNOSIS — Z8249 Family history of ischemic heart disease and other diseases of the circulatory system: Secondary | ICD-10-CM | POA: Diagnosis not present

## 2022-05-06 DIAGNOSIS — G9349 Other encephalopathy: Secondary | ICD-10-CM | POA: Diagnosis present

## 2022-05-06 DIAGNOSIS — Z7982 Long term (current) use of aspirin: Secondary | ICD-10-CM | POA: Diagnosis not present

## 2022-05-06 DIAGNOSIS — Z66 Do not resuscitate: Secondary | ICD-10-CM | POA: Diagnosis present

## 2022-05-06 DIAGNOSIS — Z806 Family history of leukemia: Secondary | ICD-10-CM | POA: Diagnosis not present

## 2022-05-06 DIAGNOSIS — I444 Left anterior fascicular block: Secondary | ICD-10-CM | POA: Diagnosis present

## 2022-05-06 DIAGNOSIS — F01518 Vascular dementia, unspecified severity, with other behavioral disturbance: Secondary | ICD-10-CM | POA: Diagnosis present

## 2022-05-06 DIAGNOSIS — I129 Hypertensive chronic kidney disease with stage 1 through stage 4 chronic kidney disease, or unspecified chronic kidney disease: Secondary | ICD-10-CM | POA: Diagnosis present

## 2022-05-06 DIAGNOSIS — E1140 Type 2 diabetes mellitus with diabetic neuropathy, unspecified: Secondary | ICD-10-CM | POA: Diagnosis present

## 2022-05-06 DIAGNOSIS — E876 Hypokalemia: Secondary | ICD-10-CM | POA: Diagnosis present

## 2022-05-06 LAB — CBC
HCT: 38.9 % (ref 36.0–46.0)
Hemoglobin: 13.5 g/dL (ref 12.0–15.0)
MCH: 33 pg (ref 26.0–34.0)
MCHC: 34.7 g/dL (ref 30.0–36.0)
MCV: 95.1 fL (ref 80.0–100.0)
Platelets: 331 10*3/uL (ref 150–400)
RBC: 4.09 MIL/uL (ref 3.87–5.11)
RDW: 13.3 % (ref 11.5–15.5)
WBC: 7.8 10*3/uL (ref 4.0–10.5)
nRBC: 0 % (ref 0.0–0.2)

## 2022-05-06 LAB — GLUCOSE, CAPILLARY
Glucose-Capillary: 120 mg/dL — ABNORMAL HIGH (ref 70–99)
Glucose-Capillary: 111 mg/dL — ABNORMAL HIGH (ref 70–99)
Glucose-Capillary: 214 mg/dL — ABNORMAL HIGH (ref 70–99)
Glucose-Capillary: 155 mg/dL — ABNORMAL HIGH (ref 70–99)

## 2022-05-06 LAB — LIPID PANEL
Cholesterol: 140 mg/dL (ref 0–200)
HDL: 57 mg/dL (ref >40)
LDL Cholesterol: 65 mg/dL (ref 0–99)
Total CHOL/HDL Ratio: 2.5 RATIO
Triglycerides: 91 mg/dL (ref <150)
VLDL: 18 mg/dL (ref 0–40)

## 2022-05-06 LAB — BASIC METABOLIC PANEL
Anion gap: 10 (ref 5–15)
BUN: 15 mg/dL (ref 8–23)
CO2: 20 mmol/L — ABNORMAL LOW (ref 22–32)
Calcium: 9.7 mg/dL (ref 8.9–10.3)
Chloride: 108 mmol/L (ref 98–111)
Creatinine, Ser: 0.86 mg/dL (ref 0.44–1.00)
GFR, Estimated: >60 mL/min (ref >60)
Glucose, Bld: 118 mg/dL — ABNORMAL HIGH (ref 70–99)
Potassium: 3.5 mmol/L (ref 3.5–5.1)
Sodium: 138 mmol/L (ref 135–145)

## 2022-05-06 LAB — HEMOGLOBIN A1C
Hgb A1c MFr Bld: 5.8 % — ABNORMAL HIGH (ref 4.8–5.6)
Mean Plasma Glucose: 119.76 mg/dL

## 2022-05-06 MED ORDER — ONDANSETRON HCL 4 MG/2ML IJ SOLN: 4.0000 mg | Freq: Three times a day (TID) | INTRAMUSCULAR | Status: DC | PRN

## 2022-05-06 MED ORDER — AMLODIPINE BESYLATE 5 MG PO TABS
10.0000 mg | ORAL_TABLET | Freq: Every day | ORAL | Status: DC
  Administered 2022-05-06 – 2022-05-11 (×6): 10 mg via ORAL
  Filled 2022-05-06 (×6): qty 2

## 2022-05-06 MED ORDER — ASPIRIN 81 MG PO TBEC
81.0000 mg | DELAYED_RELEASE_TABLET | Freq: Every day | ORAL | Status: DC
  Administered 2022-05-06: 81 mg via ORAL
  Filled 2022-05-06: qty 1

## 2022-05-06 MED ORDER — ATORVASTATIN CALCIUM 40 MG PO TABS
80.0000 mg | ORAL_TABLET | Freq: Every evening | ORAL | Status: DC
  Administered 2022-05-06 – 2022-05-11 (×6): 80 mg via ORAL
  Filled 2022-05-06 (×6): qty 2

## 2022-05-06 MED ORDER — PANTOPRAZOLE SODIUM 40 MG PO TBEC
40.0000 mg | DELAYED_RELEASE_TABLET | Freq: Every day | ORAL | Status: DC
  Administered 2022-05-06 – 2022-05-11 (×6): 40 mg via ORAL
  Filled 2022-05-06 (×6): qty 1

## 2022-05-06 MED ORDER — ACETAMINOPHEN 325 MG PO TABS
650.0000 mg | ORAL_TABLET | Freq: Four times a day (QID) | ORAL | Status: DC | PRN
  Administered 2022-05-07 – 2022-05-10 (×3): 650 mg via ORAL
  Filled 2022-05-06 (×4): qty 2

## 2022-05-06 MED ORDER — CLOPIDOGREL BISULFATE 75 MG PO TABS
75.0000 mg | ORAL_TABLET | Freq: Every day | ORAL | Status: DC
  Administered 2022-05-06 – 2022-05-11 (×6): 75 mg via ORAL
  Filled 2022-05-06 (×6): qty 1

## 2022-05-06 MED ORDER — TIZANIDINE HCL 4 MG PO TABS
2.0000 mg | ORAL_TABLET | Freq: Every evening | ORAL | Status: DC | PRN
  Administered 2022-05-08: 2 mg via ORAL
  Filled 2022-05-06: qty 1

## 2022-05-06 NOTE — Evaluation (Signed)
Physical Therapy Evaluation Patient Details Name: Jade [name] MRN: [id number] DOB: 19-Nov-1955 Today's Date: 05/06/2022  History of Present Illness  Pt is a 66 year old female admitted 9/9 for recurrent UTI. PMH: T2DM with neuropathy, HLD, HTN, CKD 3, multiple prior CVA and carotid stenosis, recurrent UTIs   Clinical Impression  Pt admitted with above diagnosis. PTA pt lived at home with her husband. Pt is a poor historian, but per chart she is primarily bed or w/c bound requiring assist for transfers and all ADLs. Pt currently with functional limitations due to the deficits listed below (see PT Problem List). On eval, pt required +2 max assist bed mobility, and +2 max assist sit to stand. She demonstrated poor sitting balance and zero standing balance. BUE/LE deficits noted from previous CVAs, most notable RUE/LE hypertonicity. Pt will benefit from skilled PT to increase their independence and safety with mobility to allow discharge to the venue listed below.  If pt returns home with family, recommend 24-hour assist as pt is unable to feed herself or initiate any functional mobility independently.        Recommendations for follow up therapy are one component of a multi-disciplinary discharge planning process, led by the attending physician.  Recommendations may be updated based on patient status, additional functional criteria and insurance authorization.  Follow Up Recommendations Skilled nursing-short term rehab (<3 hours/day) Can patient physically be transported by private vehicle: No    Assistance Recommended at Discharge Frequent or constant Supervision/Assistance  Patient can return home with the following  Two people to help with walking and/or transfers;Two people to help with bathing/dressing/bathroom;Assistance with cooking/housework;Assistance with feeding;Direct supervision/assist for medications management;Direct supervision/assist for financial management;Assist for  transportation    Equipment Recommendations Hospital bed;Other (comment) (hoyer lift) (If pt does not already have this equipment. It was recommended on previous admission. Pt is a poor historian and unable to relay if this equipment was received prior to return home.)   Recommendations for Other Services       Functional Status Assessment Patient has had a recent decline in their functional status and demonstrates the ability to make significant improvements in function in a reasonable and predictable amount of time.     Precautions / Restrictions Precautions Precautions: Fall;Other (comment) Precaution Comments: R hemiparesis with hypertonicity from prior CVA      Mobility  Bed Mobility Overal bed mobility: Needs Assistance Bed Mobility: Supine to Sit, Sit to Supine     Supine to sit: +2 for physical assistance, Max assist, HOB elevated Sit to supine: +2 for physical assistance, Max assist   General bed mobility comments: assist for trunk and LE management, scooting to/from EOB.    Transfers Overall transfer level: Needs assistance Equipment used: 2 person hand held assist Transfers: Sit to/from Stand Sit to Stand: +2 physical assistance, Max assist           General transfer comment: sit to stand x 2 from EOB. Pt able to attain semi standing position due to maintaining flexed posture at trunk and BLE. Static stand < 10 seconds each trial.    Ambulation/Gait                  Stairs            Wheelchair Mobility    Modified Rankin (Stroke Patients Only)       Balance Overall balance assessment: Needs assistance Sitting-balance support: No upper extremity supported, Feet supported Sitting balance-Leahy Scale: Poor Sitting balance - Comments: periods  of unsupported balance, transitioning to needing min truncal assist to prevent L lean. Worsening with fatigue. Postural control: Left lateral lean Standing balance support: Bilateral upper extremity  supported Standing balance-Leahy Scale: Zero Standing balance comment: MaxAx2 and UE support to stand.                             Pertinent Vitals/Pain Pain Assessment Pain Assessment: Faces Faces Pain Scale: Hurts even more Pain Location: "everywhere" Pain Descriptors / Indicators: Discomfort, Grimacing, Guarding Pain Intervention(s): Monitored during session, Limited activity within patient's tolerance, Repositioned    Home Living Family/patient expects to be discharged to:: Private residence Living Arrangements: Spouse/significant other Available Help at Discharge: Family;Available PRN/intermittently (spouse and son both work during the day) Type of Home: House Home Access: Level entry       Home Layout: One level Home Equipment: Agricultural consultant (2 wheels);BSC/3in1;Wheelchair - Manufacturing systems engineer;Tub bench;Grab bars - tub/shower;Grab bars - toilet;Hospital bed;Other (comment) (hoyer lift) Additional Comments: Home set up taken from previous admission. Pt is a poor historian. No family present. When asked "do you sleep in a regular bed or hospital bed?" pt replies "both." Pt denies having hoyer lift at home but it is listed in home equipment from previous admission.    Prior Function Prior Level of Function : Needs assist       Physical Assist : Mobility (physical);ADLs (physical) Mobility (physical): Bed mobility;Transfers   Mobility Comments: assist for all functional mobility. Primarily in bed or w/c. ADLs Comments: Needs assistance for all ADLs, wears depends. Cannot feed self and is in bed "most of the time"     Hand Dominance   Dominant Hand: Right    Extremity/Trunk Assessment   Upper Extremity Assessment Upper Extremity Assessment: Defer to OT evaluation    Lower Extremity Assessment Lower Extremity Assessment: Generalized weakness;RLE deficits/detail;LLE deficits/detail RLE Deficits / Details: hypertonicity with RLE resting in flexed and ER  postion. Able to passively stretch into neutral but immediately returns to prior position. BLE resting in crossed position distally. LLE Deficits / Details: resting in adduction, IR, and knee flexion resulting in crossed leg position distally. Able to passively stretch into neutral.    Cervical / Trunk Assessment Cervical / Trunk Assessment: Kyphotic  Communication   Communication: No difficulties  Cognition Arousal/Alertness: Awake/alert Behavior During Therapy: Flat affect Overall Cognitive Status: No family/caregiver present to determine baseline cognitive functioning Area of Impairment: Orientation, Attention, Memory, Following commands, Awareness, Problem solving                 Orientation Level: Disoriented to, Time, Situation Current Attention Level: Selective Memory: Decreased short-term memory Following Commands: Follows one step commands with increased time   Awareness: Intellectual Problem Solving: Slow processing, Decreased initiation, Difficulty sequencing, Requires verbal cues          General Comments      Exercises     Assessment/Plan    PT Assessment Patient needs continued PT services  PT Problem List Decreased strength;Decreased activity tolerance;Decreased balance;Decreased mobility;Decreased coordination;Decreased cognition;Decreased knowledge of use of DME;Impaired tone       PT Treatment Interventions DME instruction;Functional mobility training;Therapeutic activities;Therapeutic exercise;Balance training;Neuromuscular re-education;Cognitive remediation;Wheelchair mobility training;Patient/family education    PT Goals (Current goals can be found in the Care Plan section)  Acute Rehab PT Goals Patient Stated Goal: not stated PT Goal Formulation: With patient Time For Goal Achievement: 05/20/22 Potential to Achieve Goals: Fair    Frequency Min  2X/week     Co-evaluation PT/OT/SLP Co-Evaluation/Treatment: Yes Reason for Co-Treatment:  Complexity of the patient's impairments (multi-system involvement);Necessary to address cognition/behavior during functional activity;For patient/therapist safety;To address functional/ADL transfers PT goals addressed during session: Mobility/safety with mobility;Balance         AM-PAC PT "6 Clicks" Mobility  Outcome Measure Help needed turning from your back to your side while in a flat bed without using bedrails?: A Lot Help needed moving from lying on your back to sitting on the side of a flat bed without using bedrails?: Total Help needed moving to and from a bed to a chair (including a wheelchair)?: Total Help needed standing up from a chair using your arms (e.g., wheelchair or bedside chair)?: Total Help needed to walk in hospital room?: Total Help needed climbing 3-5 steps with a railing? : Total 6 Click Score: 7    End of Session Equipment Utilized During Treatment: Gait belt Activity Tolerance: Patient tolerated treatment well Patient left: in bed;with call bell/phone within reach;with bed alarm set Nurse Communication: Mobility status PT Visit Diagnosis: Unsteadiness on feet (R26.81);Muscle weakness (generalized) (M62.81);Difficulty in walking, not elsewhere classified (R26.2);History of falling (Z91.81);Apraxia (R48.2)    Time: 0347-4259 PT Time Calculation (min) (ACUTE ONLY): 23 min   Charges:   PT Evaluation $PT Eval Moderate Complexity: 1 Mod          Aida Raider, PT  Office # [phone] Pager [phone]   Ilda Foil 05/06/2022, 10:29 AM

## 2022-05-06 NOTE — Plan of Care (Signed)

## 2022-05-06 NOTE — ED Notes (Signed)
ED TO INPATIENT HANDOFF REPORT  ED Nurse Name and Phone #: Verda Mehta RN   S Name/Age/Gender Donata Duff Rewerts 66 y.o. female Room/Bed: 034C/034C  Code Status   Code Status: DNR  Home/SNF/Other Home Patient oriented to: self, place, and situation Is this baseline? No   Triage Complete: Triage complete  Chief Complaint Recurrent UTI [N39.0]  Triage Note Pt BIB EMS after having positive leukocytes and nitrates in her UA. Patient has been in and out of the hospital for recurrent UTIs. Family and home health aid state that pt has  astrict regimen of changing and cleaning in order to prevent UTI's and are unsure of why she keeps getting them. Family has stated that patient has been agitated and irritable with disorientation.   VSS except for a BP of 172/98.   Allergies Allergies  Allergen Reactions   Baclofen Other (See Comments)    Pt said felt weaker and had slurred speech   Nickel Dermatitis and Rash    Level of Care/Admitting Diagnosis ED Disposition     ED Disposition  Admit   Condition  --   Comment  Hospital Area: [facility] [[id number]]  Level of Care: Med-Surg [16]  May place patient in observation at [facility] or [facility] if equivalent level of care is available:: No  Covid Evaluation: Asymptomatic - no recent exposure (last 10 days) testing not required  Diagnosis: Recurrent UTI [536644]  Admitting Physician: Inez Catalina [[id number]]  Attending Physician: Nena Polio          B Medical/Surgery History Past Medical History:  Diagnosis Date   Arthritis    "back, knees, some in my left hip" (03/21/2018)   Chronic kidney disease    "was told I had 25% kidney function in early 2012" (03/21/2018)   COPD (chronic obstructive pulmonary disease) (HCC)    CVA (cerebral vascular accident) (HCC) 03/21/2018   "numb all over; head to toe" (03/21/2018)   Fibromyalgia    History of gout    Hyperlipidemia    Hypertension    Migraine     "used to get them monthly during menopause" (03/21/2018)   Neuromuscular disorder (HCC)    OSA (obstructive sleep apnea)    "don't tolerate the machine" (03/21/2018)   Pneumonia ~ 2007   Type 2 diabetes, diet controlled (HCC)    Past Surgical History:  Procedure Laterality Date   BIOPSY  09/20/2021   Procedure: BIOPSY;  Surgeon: Tressia Danas, MD;  Location: [facility] ENDOSCOPY;  Service: Gastroenterology;;   ESOPHAGOGASTRODUODENOSCOPY (EGD) WITH PROPOFOL N/A 09/20/2021   Procedure: ESOPHAGOGASTRODUODENOSCOPY (EGD) WITH PROPOFOL;  Surgeon: Tressia Danas, MD;  Location: [facility] ENDOSCOPY;  Service: Gastroenterology;  Laterality: N/A;   HERNIA REPAIR     LAPAROSCOPIC CHOLECYSTECTOMY  06/2007   "no UHR w/this" (03/21/2018)   TONSILLECTOMY     TRANSCAROTID ARTERY REVASCULARIZATION  Left 04/06/2021   Procedure: LEFT TRANSCAROTID ARTERY REVASCULARIZATION;  Surgeon: Leonie Douglas, MD;  Location: [facility] OR;  Service: Vascular;  Laterality: Left;   ULTRASOUND GUIDANCE FOR VASCULAR ACCESS Right 04/06/2021   Procedure: ULTRASOUND GUIDANCE FOR VASCULAR ACCESS;  Surgeon: Leonie Douglas, MD;  Location[facility] OR;  Service: Vascular;  Laterality: Right;   UMBILICAL HERNIA REPAIR  2009     A IV Location/Drains/Wounds Patient Lines/Drains/Airways Status     Active Line/Drains/Airways     Name Placement date Placement time Site Days   Peripheral IV 05/05/22 22 G Anterior;Right Hand 05/05/22  1900  Hand  1            Intake/Output Last 24 hours No intake or output data in the 24 hours ending 05/06/22 0151  Labs/Imaging Results for orders placed or performed during the hospital encounter of 05/05/22 (from the past 48 hour(s))  CBC with Differential     Status: Abnormal   Collection Time: 05/05/22  7:23 PM  Result Value Ref Range   WBC 7.9 4.0 - 10.5 K/uL   RBC 4.82 3.87 - 5.11 MIL/uL   Hemoglobin 15.5 (H) 12.0 - 15.0 g/dL   HCT 12.7 (H) 51.7 - 00.1 %   MCV 97.1 80.0 - 100.0 fL   MCH 32.2 26.0 -  34.0 pg   MCHC 33.1 30.0 - 36.0 g/dL   RDW 74.9 44.9 - 67.5 %   Platelets 364 150 - 400 K/uL   nRBC 0.0 0.0 - 0.2 %   Neutrophils Relative % 62 %   Neutro Abs 4.9 1.7 - 7.7 K/uL   Lymphocytes Relative 26 %   Lymphs Abs 2.0 0.7 - 4.0 K/uL   Monocytes Relative 9 %   Monocytes Absolute 0.7 0.1 - 1.0 K/uL   Eosinophils Relative 2 %   Eosinophils Absolute 0.2 0.0 - 0.5 K/uL   Basophils Relative 1 %   Basophils Absolute 0.1 0.0 - 0.1 K/uL   Immature Granulocytes 0 %   Abs Immature Granulocytes 0.02 0.00 - 0.07 K/uL    Comment: Performed at [facility], [address] [place], [place] [address]  Comprehensive metabolic panel     Status: Abnormal   Collection Time: 05/05/22  7:23 PM  Result Value Ref Range   Sodium 139 135 - 145 mmol/L   Potassium 3.8 3.5 - 5.1 mmol/L   Chloride 106 98 - 111 mmol/L   CO2 21 (L) 22 - 32 mmol/L   Glucose, Bld 93 70 - 99 mg/dL    Comment: Glucose reference range applies only to samples taken after fasting for at least 8 hours.   BUN 16 8 - 23 mg/dL   Creatinine, Ser 4.66 0.44 - 1.00 mg/dL   Calcium 59.9 8.9 - 35.7 mg/dL   Total Protein 7.4 6.5 - 8.1 g/dL   Albumin 4.0 3.5 - 5.0 g/dL   AST 27 15 - 41 U/L   ALT 18 0 - 44 U/L   Alkaline Phosphatase 83 38 - 126 U/L   Total Bilirubin 0.4 0.3 - 1.2 mg/dL   GFR, Estimated >01 >77 mL/min    Comment: (NOTE) Calculated using the CKD-EPI Creatinine Equation (2021)    Anion gap 12 5 - 15    Comment: Performed at [facility], [address], [place], [place] [address]  Lactic acid, plasma     Status: None   Collection Time: 05/05/22  7:23 PM  Result Value Ref Range   Lactic Acid, Venous 1.6 0.5 - 1.9 mmol/L    Comment: Performed at [facility], [address], [place], [place] [address]  Urinalysis, Routine w reflex microscopic     Status: Abnormal   Collection Time: 05/05/22  9:56 PM  Result Value Ref Range   Color, Urine YELLOW YELLOW   APPearance HAZY (A) CLEAR   Specific Gravity,  Urine 1.010 1.005 - 1.030   pH 6.0 5.0 - 8.0   Glucose, UA NEGATIVE NEGATIVE mg/dL   Hgb urine dipstick NEGATIVE NEGATIVE   Bilirubin Urine NEGATIVE NEGATIVE   Ketones, ur NEGATIVE NEGATIVE mg/dL  Protein, ur NEGATIVE NEGATIVE mg/dL   Nitrite POSITIVE (A) NEGATIVE   Leukocytes,Ua LARGE (A) NEGATIVE   RBC / HPF 0-5 0 - 5 RBC/hpf   WBC, UA >50 (H) 0 - 5 WBC/hpf   Bacteria, UA RARE (A) NONE SEEN   Squamous Epithelial / LPF 0-5 0 - 5    Comment: Performed at [facility], [address], [place], [place] [address]  Lactic acid, plasma     Status: None   Collection Time: 05/05/22 10:25 PM  Result Value Ref Range   Lactic Acid, Venous 1.2 0.5 - 1.9 mmol/L    Comment: Performed at [facility], [address] [place], [place] [address]   CT Renal Stone Study  Result Date: 05/05/2022 CLINICAL DATA:  Recurrent UTIs. EXAM: CT ABDOMEN AND PELVIS WITHOUT CONTRAST TECHNIQUE: Multidetector CT imaging of the abdomen and pelvis was performed following the standard protocol without IV contrast. RADIATION DOSE REDUCTION: This exam was performed according to the departmental dose-optimization program which includes automated exposure control, adjustment of the mA and/or kV according to patient size and/or use of iterative reconstruction technique. COMPARISON:  February 15, 2022 FINDINGS: Lower chest: Mild bibasilar linear scarring and/or atelectasis is seen. Hepatobiliary: No focal liver abnormality is seen. Multiple gallstones are seen within the gallbladder fossa. These are present on the prior study. The gallbladder is not clearly identified. Pancreas: Unremarkable. No pancreatic ductal dilatation or surrounding inflammatory changes. Spleen: Normal in size without focal abnormality. Adrenals/Urinary Tract: Adrenal glands are unremarkable. Kidneys are normal, without obstructing renal calculi, focal lesion, or hydronephrosis. 2 mm calcifications are seen within the mid left kidney which may be vascular  in origin. Bladder is unremarkable. Stomach/Bowel: Stomach is within normal limits. Appendix appears normal. No evidence of bowel wall thickening, distention, or inflammatory changes. Noninflamed diverticula are seen within the descending and sigmoid colon. Vascular/Lymphatic: Aortic atherosclerosis. No enlarged abdominal or pelvic lymph nodes. Reproductive: Uterus and bilateral adnexa are unremarkable. Other: Surgical mesh is seen along the anterior aspect of the lower abdomen and upper pelvis. No abdominal wall hernia or abnormality. No abdominopelvic ascites. Musculoskeletal: Chronic compression fracture deformities are seen at the levels of T12 and L1. Multilevel degenerative changes are noted throughout the lumbar spine. IMPRESSION: 1. Cholelithiasis. 2. Colonic diverticulosis. 3. Chronic compression fracture deformities of the T12 and L1 vertebral bodies. 4. Aortic atherosclerosis. Aortic Atherosclerosis (ICD10-I70.0). Electronically Signed   By: Aram Candela M.D.   On: 05/05/2022 20:42   CT HEAD WO CONTRAST ( )  Result Date: 05/05/2022 CLINICAL DATA:  Altered mental status. EXAM: CT HEAD WITHOUT CONTRAST TECHNIQUE: Contiguous axial images were obtained from the base of the skull through the vertex without intravenous contrast. RADIATION DOSE REDUCTION: This exam was performed according to the departmental dose-optimization program which includes automated exposure control, adjustment of the mA and/or kV according to patient size and/or use of iterative reconstruction technique. COMPARISON:  March 17, 2022 FINDINGS: Brain: There is mild cerebral atrophy with widening of the extra-axial spaces and ventricular dilatation. There are areas of decreased attenuation within the white matter tracts of the supratentorial brain, consistent with microvascular disease changes. Chronic bilateral occipital lobe and chronic bilateral parietal lobe infarcts are seen. Vascular: No hyperdense vessel or unexpected  calcification. Skull: Normal. Negative for fracture or focal lesion. Sinuses/Orbits: There is mild to moderate severity sphenoid sinus mucosal thickening. Other: None. IMPRESSION: 1. Generalized cerebral atrophy and chronic white matter small vessel ischemic changes buttons of an acute intracranial abnormality.  2. Chronic bilateral occipital lobe and chronic bilateral parietal lobe infarcts. Electronically Signed   By: Aram Candela M.D.   On: 05/05/2022 20:37    Pending Labs Unresulted Labs (From admission, onward)     Start     Ordered   05/06/22 0500  Basic metabolic panel  Tomorrow morning,   R        05/05/22 2326   05/06/22 0500  CBC  Tomorrow morning,   R        05/05/22 2326   05/05/22 1906  Blood culture (routine x 2)  BLOOD CULTURE X 2,   R (with STAT occurrences)      05/05/22 1905   05/05/22 1847  Urine Culture  (Urine Culture)  Once,   URGENT       Question:  Indication  Answer:  Altered mental status (if no other cause identified)   05/05/22 1846            Vitals/Pain Today's Vitals   05/05/22 2245 05/05/22 2300 05/05/22 2315 05/06/22 0023  BP: (!) 158/81 (!) 154/90 (!) 150/81   Pulse: (!) 58 75 63   Resp: 14 (!) 21 16   Temp:    98.2 F (36.8 C)  TempSrc:    Oral  SpO2: 97% 98% 98%     Isolation Precautions No active isolations  Medications Medications  piperacillin-tazobactam (ZOSYN) IVPB 3.375 g (3.375 g Intravenous New Bag/Given 05/06/22 0023)  rivaroxaban (XARELTO) tablet 10 mg (has no administration in time range)    Mobility walks with device High fall risk    R Recommendations: See Admitting Provider Note  Report given to: Shearon Stalls RN  Additional Notes:

## 2022-05-06 NOTE — Evaluation (Signed)
Occupational Therapy Evaluation Patient Details Name: Jade [name] MRN: [id number] DOB: 05/13/1956 Today's Date: 05/06/2022   History of Present Illness Pt is a 66 year old female admitted 9/9 for recurrent UTI. PMH: T2DM with neuropathy, HLD, HTN, CKD 3, multiple prior CVA and carotid stenosis, recurrent UTIs   Clinical Impression   Pt needing near total assist at baseline for ADLs and mobility, lives with family who can assist PRN. Pt reports she is in bed most of the day. Pt currently needing max-total A for ADLs, max A +2 for bed mobility and transfers. Pt with BUE weakness R > L from prior CVA. Pt able to bring L hand to mouth, however fatigues quickly. Discussed with RN need for soft touch call bell and assist for self feeding. Pt presenting with impairments listed below, will follow acutely. Recommend SNF at d/c, however if pt returning home will need 24/7 assist.     Recommendations for follow up therapy are one component of a multi-disciplinary discharge planning process, led by the attending physician.  Recommendations may be updated based on patient status, additional functional criteria and insurance authorization.   Follow Up Recommendations  Skilled nursing-short term rehab (<3 hours/day)    Assistance Recommended at Discharge Frequent or constant Supervision/Assistance  Patient can return home with the following Two people to help with walking and/or transfers;A lot of help with bathing/dressing/bathroom;Assistance with cooking/housework;Assistance with feeding;Direct supervision/assist for medications management;Direct supervision/assist for financial management;Assist for transportation;Help with stairs or ramp for entrance    Functional Status Assessment  Patient has had a recent decline in their functional status and demonstrates the ability to make significant improvements in function in a reasonable and predictable amount of time.  Equipment Recommendations  Hospital  bed;Other (comment) (hoyer lift)    Recommendations for Other Services PT consult     Precautions / Restrictions Precautions Precautions: Fall;Other (comment) Precaution Comments: R hemiparesis with hypertonicity from prior CVA Restrictions Weight Bearing Restrictions: No      Mobility Bed Mobility Overal bed mobility: Needs Assistance Bed Mobility: Supine to Sit, Sit to Supine     Supine to sit: +2 for physical assistance, Max assist, HOB elevated Sit to supine: +2 for physical assistance, Max assist        Transfers Overall transfer level: Needs assistance Equipment used: 2 person hand held assist Transfers: Sit to/from Stand Sit to Stand: +2 physical assistance, Max assist                  Balance Overall balance assessment: Needs assistance Sitting-balance support: No upper extremity supported, Feet supported Sitting balance-Leahy Scale: Poor Sitting balance - Comments: periods of unsupported balance, transitioning to needing min truncal assist to prevent L lean. Worsening with fatigue and dual tasking Postural control: Left lateral lean Standing balance support: Bilateral upper extremity supported Standing balance-Leahy Scale: Zero Standing balance comment: MaxAx2 and UE support to stand.                           ADL either performed or assessed with clinical judgement   ADL Overall ADL's : Needs assistance/impaired Eating/Feeding: Maximal assistance   Grooming: Maximal assistance   Upper Body Bathing: Total assistance   Lower Body Bathing: Total assistance   Upper Body Dressing : Total assistance   Lower Body Dressing: Total assistance   Toilet Transfer: Total assistance   Toileting- Clothing Manipulation and Hygiene: Total assistance       Functional mobility during ADLs:  Maximal assistance;+2 for physical assistance       Vision   Additional Comments: will further assess     Perception     Praxis      Pertinent  Vitals/Pain Pain Assessment Pain Assessment: Faces Pain Score: 6  Faces Pain Scale: Hurts even more Pain Location: "everywhere" Pain Descriptors / Indicators: Discomfort, Grimacing, Guarding Pain Intervention(s): Limited activity within patient's tolerance, Monitored during session, Repositioned     Hand Dominance Right   Extremity/Trunk Assessment Upper Extremity Assessment Upper Extremity Assessment: Defer to OT evaluation RUE Deficits / Details: increased tone in shoulder and elbow, can weakly grasp with R hand, unable to bring hand to mouth RUE Coordination: decreased fine motor;decreased gross motor LUE Deficits / Details: significant weakness, but stronger/increased ROM/strength compared to RUE, can bring hand to mouth with increased effort LUE Coordination: decreased gross motor;decreased fine motor   Lower Extremity Assessment Lower Extremity Assessment: Defer to PT evaluation RLE Deficits / Details: hypertonicity with RLE resting in flexed and ER postion. Able to passively stretch into neutral but immediately returns to prior position. BLE resting in crossed position distally. LLE Deficits / Details: resting in adduction, IR, and knee flexion resulting in crossed leg position distally. Able to passively stretch into neutral.   Cervical / Trunk Assessment Cervical / Trunk Assessment: Kyphotic   Communication Communication Communication: No difficulties   Cognition Arousal/Alertness: Awake/alert Behavior During Therapy: Flat affect Overall Cognitive Status: No family/caregiver present to determine baseline cognitive functioning Area of Impairment: Orientation, Attention, Memory, Following commands, Awareness, Problem solving                 Orientation Level: Disoriented to, Time, Situation Current Attention Level: Selective Memory: Decreased short-term memory Following Commands: Follows one step commands with increased time   Awareness: Intellectual Problem  Solving: Slow processing, Decreased initiation, Difficulty sequencing, Requires verbal cues       General Comments  discussed with RN about assisting with feeding tasks, and pt will need soft touch call bell    Exercises     Shoulder Instructions      Home Living Family/patient expects to be discharged to:: Private residence Living Arrangements: Spouse/significant other Available Help at Discharge: Family;Available PRN/intermittently Type of Home: House Home Access: Level entry     Home Layout: One level     Bathroom Shower/Tub: Chief Strategy Officer: Standard Bathroom Accessibility: No   Home Equipment: Agricultural consultant (2 wheels);BSC/3in1;Wheelchair - Manufacturing systems engineer;Tub bench;Grab bars - tub/shower;Grab bars - toilet;Hospital bed;Other (comment)   Additional Comments: Home set up taken from previous admission. Pt is a poor historian. No family present. When asked "do you sleep in a regular bed or hospital bed?" pt replies "both." Pt denies having hoyer lift at home but it is listed in home equipment from previous admission.      Prior Functioning/Environment Prior Level of Function : Needs assist  Cognitive Assist : Mobility (cognitive)     Physical Assist : Mobility (physical);ADLs (physical) Mobility (physical): Bed mobility;Transfers ADLs (physical): Bathing;Dressing;Toileting;IADLs;Grooming Mobility Comments: assist for all functional mobility. Primarily in bed or w/c, reports transferring bed > w/c 1x/week with family assistance ADLs Comments: Needs assistance for all ADLs, wears depends. Cannot feed self and is in bed "most of the time"        OT Problem List: Decreased strength;Decreased range of motion;Decreased activity tolerance;Impaired balance (sitting and/or standing);Decreased cognition;Decreased safety awareness;Impaired UE functional use;Impaired tone;Decreased coordination      OT Treatment/Interventions: Self-care/ADL  training;Therapeutic exercise;Neuromuscular education;Energy conservation;Manual therapy;DME and/or AE instruction;Modalities;Splinting;Therapeutic activities;Cognitive remediation/compensation;Visual/perceptual remediation/compensation;Patient/family education;Balance training    OT Goals(Current goals can be found in the care plan section) Acute Rehab OT Goals Patient Stated Goal: none stated OT Goal Formulation: With patient Time For Goal Achievement: 05/20/22 Potential to Achieve Goals: Fair ADL Goals Pt Will Perform Eating: with min assist;with adaptive utensils;sitting;bed level Pt Will Perform Grooming: with min assist;with adaptive equipment;sitting;bed level Additional ADL Goal #1: pt will perform bed mobility mod A in prep for ADLs  OT Frequency: Min 2X/week    Co-evaluation PT/OT/SLP Co-Evaluation/Treatment: Yes Reason for Co-Treatment: Complexity of the patient's impairments (multi-system involvement);For patient/therapist safety;To address functional/ADL transfers PT goals addressed during session: Mobility/safety with mobility;Balance OT goals addressed during session: ADL's and self-care      AM-PAC OT "6 Clicks" Daily Activity     Outcome Measure Help from another person eating meals?: A Lot Help from another person taking care of personal grooming?: A Lot Help from another person toileting, which includes using toliet, bedpan, or urinal?: Total Help from another person bathing (including washing, rinsing, drying)?: Total Help from another person to put on and taking off regular upper body clothing?: Total Help from another person to put on and taking off regular lower body clothing?: Total 6 Click Score: 8   End of Session Equipment Utilized During Treatment: Gait belt Nurse Communication: Mobility status  Activity Tolerance: Patient tolerated treatment well Patient left: in bed;with call bell/phone within reach;with bed alarm set  OT Visit Diagnosis: Unsteadiness  on feet (R26.81);Other abnormalities of gait and mobility (R26.89);Muscle weakness (generalized) (M62.81);History of falling (Z91.81);Feeding difficulties (R63.3);Hemiplegia and hemiparesis;Other symptoms and signs involving cognitive function                Time: 5465-0354 OT Time Calculation (min): 18 min Charges:  OT General Charges $OT Visit: 1 Visit OT Evaluation $OT Eval Moderate Complexity: 1 [address] OTD, OTR/L Acute Rehab [phone]  Mayer Masker 05/06/2022, 12:22 PM

## 2022-05-06 NOTE — Progress Notes (Signed)
NAME:  Jade [name], MRN:  [id number], DOB:  April 30, 1956, LOS: 0 ADMISSION DATE:  05/05/2022  Subjective  NAEON  Patient evaluated at bedside this AM. She notes having difficulty sleeping last night and has a low appetite. She is partially alert and oriented, knowing her name and date of birth, location, and situation.  Was not able to state the month and year.  She had good immediate memory, and limited short term and long memory.  She had fair concentration.  She notes her lower extremities at baseline are stiff and has difficulty moving them.  She feels that she is easily angered over "stupid things".  Objective   Blood pressure (!) 163/73, pulse 69, temperature 98.4 F (36.9 C), temperature source Oral, resp. rate 17, weight 62.3 kg, SpO2 100 %.    No intake or output data in the 24 hours ending 05/06/22 1040 Filed Weights   05/06/22 0737  Weight: 62.3 kg     Physical Exam: General: Pleasant in bed. No acute distress. CV: RRR. No murmurs, rubs, or gallops. No LE edema Pulmonary: Lungs CTAB. Normal effort. No wheezing or rales. Abdominal: Soft, nontender, nondistended. Normal bowel sounds. Extremities: DP pulses 2+ and symmetric. Decreased flexion on RLE and LLE. Upper extremities ROM are normal. Sensation is decreased in the L dorsal side of the foot and ankle region.  Her legs were crossed in which she says "that is a habit that my legs are in that position" Skin: Warm and dry.  Healing circular wound on her scalp. Neuro: Partial A&O.  Ocular movements are delayed when asked to track finger.     Labs       Latest Ref Rng & Units 05/06/2022    5:36 AM 05/05/2022    7:23 PM 04/09/2022    3:35 AM  CBC  WBC 4.0 - 10.5 K/uL 7.8  7.9  9.2   Hemoglobin 12.0 - 15.0 g/dL 58.0  99.8  33.8   Hematocrit 36.0 - 46.0 % 38.9  46.8  36.6   Platelets 150 - 400 K/uL 331  364  299       Latest Ref Rng & Units 05/06/2022    5:36 AM 05/05/2022    7:23 PM 04/10/2022    3:21 AM  BMP   Glucose 70 - 99 mg/dL 250  93  539   BUN 8 - 23 mg/dL 15  16  17    Creatinine 0.44 - 1.00 mg/dL  7.67  3.41   Sodium 135 - 145 mmol/L 138  139  140   Potassium 3.5 - 5.1 mmol/L 3.5  3.8  4.4   Chloride 98 - 111 mmol/L 108  106  112   CO2 22 - 32 mmol/L 20  21  23    Calcium 8.9 - 10.3 mg/dL 9.7  9.37  9.5     Summary  Jade [name] is a 66 y.o. female with PMH T2DM with neuropathy, muscle spasticity, HLD, HTN, CKD 3, multiple prior CVA and carotid stenosis, recurrent UTIs  who presented with concern for AMS, suprapubic pain and admitted for UTI on hospital day 1.  Assessment & Plan:  Principal Problem:   Recurrent UTI   #Encephalopathy #UTI with history of recurrent multidrug-resistant Klebsiella/Proteus UTI #Multiple prior infarcts #Carotid stenosis s/p revascularization Patient with several recent admissions for treatment of multidrug-resistant Klebsiella/Proteus UTI, most recently discharged on 8/16.  UA shows large leukocytes and positive nitrites, urine culture in progress. Blood culture in progress. The memory  deficits, behavioral changes, and general functional deficits may be her current baseline status due to her extensive encephalomalacia and history of multiple previous infarcts. Suspecting vascular dementia. Consider neurology/palliative follow-up if patient's mentation does not improve. Neurology consult with Dr. Iver Nestle recommended patient do monotherapy on Plavix. - Continue IV Zosyn - Follow-up urine/blood cultures and trend CBC - Continue Plavix 75 mg daily -- D/c aspirin  #HTN SBP ranging in the 140s-160s. - Continue home amlodipine 10 mg daily   #T2DM with neuropathy #Muscle spasticity A1c is 5.8 which remains stable from last reading in January.  Patient taking Zanaflex nightly as needed and dantrolene to help with right hand muscle spasticity.  Previous documentation in chart with concern for bone marrow suppression caused by dantrolene so we will hold  dantrolene at this time. - CBGs ACHS - Continue tizanidine 2 mg nightly as needed. If this is sufficient for sxm control, will not restart dantrolene.  #CKD 3 Creatinine on 9/9 was 0.81. -- Trend BMP   #HLD-stable Lipid profile WNL, LDL 65 -- Continue Lipitor     Christia Reading, MD Internal Medicine Resident PGY-1 PAGER: [phone] 05/06/2022 10:40 AM  If after hours (below), please contact on-call pager: [phone] 5PM-7AM Monday-Friday 1PM-7AM Saturday-Sunday

## 2022-05-06 NOTE — Progress Notes (Signed)
Admitted this am from the ED. Alert, oriented to self. Denies pain or SOB. Vital signs stable. Very Irritable earlier, but calm and cooperative this morning. IV Zosyn therapy continued. Awaiting blood cultures results. Safety maintained.

## 2022-05-07 DIAGNOSIS — B961 Klebsiella pneumoniae [K. pneumoniae] as the cause of diseases classified elsewhere: Secondary | ICD-10-CM

## 2022-05-07 DIAGNOSIS — B964 Proteus (mirabilis) (morganii) as the cause of diseases classified elsewhere: Secondary | ICD-10-CM | POA: Diagnosis not present

## 2022-05-07 DIAGNOSIS — N390 Urinary tract infection, site not specified: Secondary | ICD-10-CM | POA: Diagnosis not present

## 2022-05-07 LAB — BASIC METABOLIC PANEL
Anion gap: 10 (ref 5–15)
BUN: 18 mg/dL (ref 8–23)
CO2: 19 mmol/L — ABNORMAL LOW (ref 22–32)
Calcium: 9.6 mg/dL (ref 8.9–10.3)
Chloride: 111 mmol/L (ref 98–111)
Creatinine, Ser: 1.12 mg/dL — ABNORMAL HIGH (ref 0.44–1.00)
GFR, Estimated: 54 mL/min — ABNORMAL LOW (ref >60)
Glucose, Bld: 111 mg/dL — ABNORMAL HIGH (ref 70–99)
Potassium: 3.8 mmol/L (ref 3.5–5.1)
Sodium: 140 mmol/L (ref 135–145)

## 2022-05-07 LAB — CBC
HCT: 38.5 % (ref 36.0–46.0)
Hemoglobin: 13.1 g/dL (ref 12.0–15.0)
MCH: 32.8 pg (ref 26.0–34.0)
MCHC: 34 g/dL (ref 30.0–36.0)
MCV: 96.3 fL (ref 80.0–100.0)
Platelets: 290 10*3/uL (ref 150–400)
RBC: 4 MIL/uL (ref 3.87–5.11)
RDW: 13.2 % (ref 11.5–15.5)
WBC: 7.2 10*3/uL (ref 4.0–10.5)
nRBC: 0 % (ref 0.0–0.2)

## 2022-05-07 LAB — GLUCOSE, CAPILLARY
Glucose-Capillary: 105 mg/dL — ABNORMAL HIGH (ref 70–99)
Glucose-Capillary: 113 mg/dL — ABNORMAL HIGH (ref 70–99)
Glucose-Capillary: 147 mg/dL — ABNORMAL HIGH (ref 70–99)

## 2022-05-07 MED ORDER — ENSURE ENLIVE PO LIQD
237.0000 mL | Freq: Two times a day (BID) | ORAL | Status: DC
  Administered 2022-05-07 – 2022-05-11 (×8): 237 mL via ORAL

## 2022-05-07 NOTE — TOC Progression Note (Addendum)
Transition of Care [facility]) - Progression Note    Patient Details  Name: Jade [name] MRN: [id number] Date of Birth: 1956/04/17  Transition of Care [facility]) CM/SW Contact  Janae Bridgeman, RN Phone Number: 05/07/2022, 2:46 PM  Clinical Narrative:    CM received a message by bedside nursing to call Primitivo Gauze, APS SW at [phone].  I called and left a voicemail with the patient at this time to discuss transitions of care needs.  The patient's son came to visit with the patient and states that he spoke with Katrinka Blazing, APS and the family was reported to have not been taking care of the patient in the home and that SNF/LTC placement was needed for the patient.  The patient's son states that the patient was recently admitted to [facility] but that patient returned home from the facility and APS was called by the home health agency per the son.  The son states that the patient does not own any property and receives a Tree surgeon check every month for 1320.00 per month.  The patient was living at the home with the son, son's wife and an ex- husband, [facility].  The patient's ex-husband works during the day from 6 am til 3 pm at [employer] and the son and wife were caring for the patient at the home during the day per the son.  CM will continue to follow the patient for SNF/LTC placement needs.  05/07/2022 1600 - I called and spoke with Katrinka Blazing, APS and the patient is unable to make medical decisions for herself based on patient's cognitive scores at [facility].  APS states that the patient's son, [name] is going to apply for Medicaid at DSS office this week and will assist with SNF placement.  The patient needs SNF placement and likely LTC Medicaid since the patient's family is unable to care for the patient at the home.  APS states that the patient's son was caring for the patient at the home but was not neglectful for the patient's care needs at the home.  APS states that he is  able to make decisions for the patient and apply for Medicaid.   APS, Katrinka Blazing made a home visit at the home listed prior to patient's admission to the hospital.   Expected Discharge Plan: Skilled Nursing Facility Barriers to Discharge: Continued Medical Work up  Expected Discharge Plan and Services Expected Discharge Plan: Skilled Nursing Facility         Expected Discharge Date: 05/13/22                                     Social Determinants of Health (SDOH) Interventions    Readmission Risk Interventions    04/09/2022    3:40 PM 06/30/2021    1:48 PM  Readmission Risk Prevention Plan  Transportation Screening Complete Complete  PCP or Specialist Appt within 3-5 Days Complete Complete  HRI or Home Care Consult Complete Complete  Social Work Consult for Recovery Care Planning/Counseling Complete Complete  Palliative Care Screening Complete Not Applicable  Medication Review Oceanographer) Complete

## 2022-05-07 NOTE — NC FL2 (Signed)
MEDICAID FL2 LEVEL OF CARE SCREENING TOOL     IDENTIFICATION  Patient Name: Jade [name] Birthdate: 1956/05/03 Sex: female Admission Date (Current Location): 05/05/2022  [facility] and [place] Number:  Producer, television/film/video and Address:  [facility], [address] [place], [place] [address]      Provider Number: [id number]  Attending Physician Name and Address:  Dickie La, MD  Relative Name and Phone Number:  Leotis Shames, son - [phone]    Current Level of Care: Hospital Recommended Level of Care: Skilled Nursing Facility Prior Approval Number:    Date Approved/Denied:   PASRR Number: [id number]  Discharge Plan: SNF    Current Diagnoses: Patient Active Problem List   Diagnosis Date Noted   Recurrent UTI 05/05/2022   Constipation    Urinary tract infection 04/07/2022   Goals of care, counseling/discussion    Weakness following cerebrovascular accident (CVA) 01/06/2022   Duodenal ulcer    Acute arterial ischemic stroke, multifocal, posterior circulation, unspecified laterality (HCC) 09/16/2021   Anemia 09/16/2021   COVID-19 06/25/2021   Spastic hemiparesis (HCC)    Stage 3a chronic kidney disease (HCC)    CVA (cerebral vascular accident) (HCC) 04/13/2021   Carotid artery stenosis with cerebral infarction (HCC) 04/02/2021   COPD (chronic obstructive pulmonary disease) (HCC) 04/02/2021   Obesity (BMI 30.0-34.9) 03/21/2018   IBS (irritable bowel syndrome) 04/12/2015   Hair loss 10/20/2014   Primary osteoarthritis of left knee 09/29/2014   Routine general medical examination at a health care facility 08/30/2014   Essential hypertension, benign 12/21/2013   Low back pain 08/24/2013   Patient noncompliant with statin medication 02/23/2013   H/O abnormal Pap smear 10/24/2012   Osteopenia 09/26/2012   Diabetic neuropathy, painful (HCC) 05/26/2012   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    Pure hypercholesterolemia 03/20/2012   Chronic venous insufficiency 03/20/2012    Orientation RESPIRATION BLADDER Height & Weight     Self, Time, Situation  Normal External catheter Weight: 62.3 kg Height:     BEHAVIORAL SYMPTOMS/MOOD NEUROLOGICAL BOWEL NUTRITION STATUS      Continent Diet  AMBULATORY STATUS COMMUNICATION OF NEEDS Skin   Total Care Verbally Other (Comment) (abrasion to bilateral buttocks, head)                       Personal Care Assistance Level of Assistance  Bathing, Feeding, Dressing Bathing Assistance: Maximum assistance Feeding assistance: Maximum assistance Dressing Assistance: Maximum assistance     Functional Limitations Info  Sight, Hearing, Speech Sight Info: Impaired Hearing Info: Adequate Speech Info: Adequate    SPECIAL CARE FACTORS FREQUENCY  PT (By licensed PT), OT (By licensed OT)     PT Frequency: 3-5 x per week OT Frequency: 3-5 x per week            Contractures Contractures Info: Not present    Additional Factors Info  Code Status, Allergies, Psychotropic Code Status Info: DNR Allergies Info: Baclofen, Nickel           Current Medications (05/07/2022):  This is the current hospital active medication list Current Facility-Administered Medications  Medication Dose Route Frequency Provider Last Rate Last Admin   acetaminophen (TYLENOL) tablet 650 mg  650 mg Oral Q6H PRN Lyndle Herrlich, MD   650 mg at 05/07/22 0931   amLODipine (NORVASC) tablet 10 mg  10 mg Oral Daily Lyndle Herrlich, MD   10 mg at 05/07/22 [phone]  atorvastatin (LIPITOR) tablet 80 mg  80 mg Oral QPM Lyndle Herrlich, MD   80 mg at 05/06/22 2203   clopidogrel (PLAVIX) tablet 75 mg  75 mg Oral Daily Lyndle Herrlich, MD   75 mg at 05/07/22 0931   feeding supplement (ENSURE ENLIVE / ENSURE PLUS) liquid 237 mL  237 mL Oral BID BM Christia Reading, MD   237 mL at 05/07/22 1418   ondansetron (ZOFRAN) injection 4 mg  4 mg Intravenous Q8H  PRN Lyndle Herrlich, MD       pantoprazole (PROTONIX) EC tablet 40 mg  40 mg Oral Daily Lyndle Herrlich, MD   40 mg at 05/07/22 0931   piperacillin-tazobactam (ZOSYN) IVPB 3.375 g  3.375 g Intravenous Q8H Mosetta Anis, RPH 12.5 mL/hr at 05/07/22 1523 Infusion Verify at 05/07/22 1523   rivaroxaban (XARELTO) tablet 10 mg  10 mg Oral Daily Masters, Katie, DO   10 mg at 05/07/22 0931   tiZANidine (ZANAFLEX) tablet 2 mg  2 mg Oral QHS PRN Lyndle Herrlich, MD         Discharge Medications: Please see discharge summary for a list of discharge medications.  Relevant Imaging Results:  Relevant Lab Results:   Additional Information SS# [id number].  Janae Bridgeman, RN

## 2022-05-07 NOTE — Plan of Care (Signed)

## 2022-05-07 NOTE — Progress Notes (Addendum)
NAME:  Jade [name], MRN:  [id number], DOB:  Jul 09, 1956, LOS: 1 ADMISSION DATE:  05/05/2022  Subjective  NAEON  On assessment today, patient reports "woozyness" and decreased appetite for the last week. Nothing sounds appetizing to the patient. Patient urinary symptoms are persistent. She is A&O x 2, knowing her name and location. She thinks its 2014 and in July. Knows she is in [facility] and knows that he is here because of UTIs. Reports leg stiffness is "horrible".  Objective   Blood pressure (!) 143/72, pulse (!) 57, temperature 98.2 F (36.8 C), temperature source Oral, resp. rate 16, weight 62.3 kg, SpO2 98 %.     Intake/Output Summary (Last 24 hours) at 05/07/2022 0703 Last data filed at 05/07/2022 0328 Gross per 24 hour  Intake 1076.97 ml  Output --  Net 1076.97 ml   Filed Weights   05/06/22 0737  Weight: 62.3 kg     Physical Exam: General: Pleasant in bed. No acute distress. CV: RRR. No murmurs, rubs, or gallops. No LE edema Pulmonary: Lungs CTAB. Normal effort. No wheezing or rales. Abdominal: Soft, nontender, nondistended. Normal bowel sounds. Extremities: DP pulses 2+ and symmetric. Decreased strength in RLE. No clonus on exam. Skin: Warm and dry.  Healing circular wound on her scalp. Neuro: Partial A&O.     Labs       Latest Ref Rng & Units 05/06/2022    5:36 AM 05/05/2022    7:23 PM 04/09/2022    3:35 AM  CBC  WBC 4.0 - 10.5 K/uL 7.8  7.9  9.2   Hemoglobin 12.0 - 15.0 g/dL 41.6  60.6  30.1   Hematocrit 36.0 - 46.0 % 38.9  46.8  36.6   Platelets 150 - 400 K/uL 331  364  299       Latest Ref Rng & Units 05/06/2022    5:36 AM 05/05/2022    7:23 PM 04/10/2022    3:21 AM  BMP  Glucose 70 - 99 mg/dL 601  93  093   BUN 8 - 23 mg/dL 15  16  17    Creatinine 0.44 - 1.00 mg/dL  2.35  5.73   Sodium 135 - 145 mmol/L 138  139  140   Potassium 3.5 - 5.1 mmol/L 3.5  3.8  4.4   Chloride 98 - 111 mmol/L 108  106  112   CO2 22 - 32 mmol/L 20  21  23     Calcium 8.9 - 10.3 mg/dL 9.7  2.20  9.5     Summary  Jade [name] is a 66 y.o. female with PMH T2DM with neuropathy, muscle spasticity, HLD, HTN, CKD 3, multiple prior CVA and carotid stenosis, recurrent UTIs  who presented with concern for AMS, suprapubic pain and admitted for UTI on hospital day 2.  Assessment & Plan:  Principal Problem:   Recurrent UTI   #Encephalopathy #UTI with history of recurrent multidrug-resistant Klebsiella/Proteus UTI #Multiple prior infarcts #Carotid stenosis s/p revascularization Patient with several recent admissions for treatment of multidrug-resistant Klebsiella/Proteus UTI, most recently discharged on 8/16.  The memory deficits, behavioral changes, and general functional deficits may be her current baseline status due to her extensive encephalomalacia and history of multiple previous infarcts, suspecting vascular dementia. Neurology consult with Dr. 79 recommended patient do monotherapy on Plavix.  - Continue IV Zosyn, day 2 -- Ensure dietary supplement and encourage PO intake - Follow-up urine cultures - Continue Plavix 75 mg daily -- D/c aspirin  #  HTN SBP ranging in the 140s-160s. - Continue home amlodipine 10 mg daily   #CKD 3 Creatinine increased from 0.8 > 1.2, most likely due to decreased intake. Ensure dietary supplement and encourage PO intake. --Trend BMP  #T2DM with neuropathy #Muscle spasticity A1c is 5.8 which remains stable from last reading in January.  Patient taking Zanaflex nightly as needed and dantrolene to help with right hand muscle spasticity. Holding dantrolene at this time due to previously noted concerns of bone marrow suppression. - CBGs ACHS - Continue tizanidine 2 mg nightly as needed. If this is sufficient for sxm control, will not restart dantrolene.   #HLD-stable Lipid profile WNL, LDL 65 -- Continue Lipitor    Christia Reading, MD Internal Medicine Resident PGY-1 PAGER: [phone] 05/07/2022 7:03  AM  If after hours (below), please contact on-call pager: [phone] 5PM-7AM Monday-Friday 1PM-7AM Saturday-Sunday

## 2022-05-08 DIAGNOSIS — B961 Klebsiella pneumoniae [K. pneumoniae] as the cause of diseases classified elsewhere: Secondary | ICD-10-CM | POA: Diagnosis not present

## 2022-05-08 DIAGNOSIS — N390 Urinary tract infection, site not specified: Secondary | ICD-10-CM | POA: Diagnosis not present

## 2022-05-08 DIAGNOSIS — B964 Proteus (mirabilis) (morganii) as the cause of diseases classified elsewhere: Secondary | ICD-10-CM | POA: Diagnosis not present

## 2022-05-08 LAB — URINE CULTURE: Culture: >=100000 — AB

## 2022-05-08 LAB — BASIC METABOLIC PANEL
Anion gap: 6 (ref 5–15)
BUN: 16 mg/dL (ref 8–23)
CO2: 23 mmol/L (ref 22–32)
Calcium: 9.4 mg/dL (ref 8.9–10.3)
Chloride: 112 mmol/L — ABNORMAL HIGH (ref 98–111)
Creatinine, Ser: 1 mg/dL (ref 0.44–1.00)
GFR, Estimated: >60 mL/min (ref >60)
Glucose, Bld: 104 mg/dL — ABNORMAL HIGH (ref 70–99)
Potassium: 3.2 mmol/L — ABNORMAL LOW (ref 3.5–5.1)
Sodium: 141 mmol/L (ref 135–145)

## 2022-05-08 LAB — GLUCOSE, CAPILLARY
Glucose-Capillary: 118 mg/dL — ABNORMAL HIGH (ref 70–99)
Glucose-Capillary: 120 mg/dL — ABNORMAL HIGH (ref 70–99)
Glucose-Capillary: 184 mg/dL — ABNORMAL HIGH (ref 70–99)
Glucose-Capillary: 112 mg/dL — ABNORMAL HIGH (ref 70–99)

## 2022-05-08 NOTE — TOC Progression Note (Addendum)
Transition of Care [facility]) - Progression Note    Patient Details  Name: Jade [name] MRN: [id number] Date of Birth: [birth year]  Transition of Care [facility]) CM/SW [place], RN Phone Number: 05/08/2022, 11:56 AM  Clinical Narrative:    CM met with the patient at the bedside to discuss transitions of care to a SNF/LTC facility for care.  I explained to the patient that APS has reached out to her son, Jade [name] to assist with completing LTC Medicaid paperwork for placement.  APS states that the patient will need to remain at the chosen SNF facility for care and transition to LTC once the Medicaid is approved.  The patient is aware as well.  I discussed with the patient SNF placement and the patient was in agreement to have her son contacted to assist since the patient does not remember the facilities from the past.  I called and spoke with the patient's son's wife on the phone and she will have the son return by phone call to discuss SNF choice for placement for the patient.  Attending MD was updated and will plan for SNF work up and bed availability choice by the patient's son.  CM will continue to follow the patient for SNF/LTC placement for the patient.  05/08/2022 1229 - I called and spoke with the patient's son Jade [name] on the phone and he is completing [place] Medicaid paperwork at the [place] office in [facility] today.  I offered Medicare choice to the patient's son and he chose [facility].  Lexine Baton, CM at the facility is aware and has accepting the patient for SNF placement.  The patient will be medically stable for discharge in the next 2-3 days per attending MD.  CM will continue to follow the patient for [facility] placement - pending [facility], Medical stability and start of insurance authorization.  05/08/2022 - I started insurance authorization with pending start date of care for 05/09/2022 since cultures are still pending for patient's urine cultures per MD attending  Team.  Expected Discharge Plan: [place] Barriers to Discharge: Continued Medical Work up  Expected Discharge Plan and Services Expected Discharge Plan: [place]         Expected Discharge Date: 05/13/22                                     Social Determinants of Health (SDOH) Interventions    Readmission Risk Interventions    05/08/2022   11:53 AM 04/09/2022    3:40 PM 06/30/2021    1:48 PM  Readmission Risk Prevention Plan  Transportation Screening Complete Complete Complete  PCP or Specialist Appt within 5-7 Days Complete    PCP or Specialist Appt within 3-5 Days  Complete Complete  Home Care Screening Complete    Medication Review (RN CM) Complete    HRI or Home Care Consult  Complete Complete  Social Work Consult for [place] Planning/Counseling  Complete Complete  Palliative Care Screening  Complete Not Applicable  Medication Review [employer])  Complete

## 2022-05-08 NOTE — Progress Notes (Addendum)
NAME:  Jade [name], MRN:  [id number], DOB:  July 06, 1956, LOS: 2 ADMISSION DATE:  05/05/2022  Subjective  NAEON  Interim history: Patient was seen in bed this morning.  When asked how she feels, she says "I've been better". She reports eating some dinner yesterday and she received Ensure as well. She continues to feel stiffness. She denies dysuria.  Objective   Blood pressure (!) 165/82, pulse 68, temperature 98.3 F (36.8 C), temperature source Oral, resp. rate 18, weight 62.3 kg, SpO2 99 %.      Intake/Output Summary (Last 24 hours) at 05/08/2022 0554 Last data filed at 05/08/2022 0531 Gross per 24 hour  Intake 360 ml  Output 850 ml  Net -490 ml   Filed Weights   05/06/22 0737  Weight: 62.3 kg     Physical Exam: General: Pleasant in bed. No acute distress. CV: RRR. No murmurs, rubs, or gallops. No LE edema Pulmonary: Lungs CTAB. Normal effort. No wheezing or rales. Abdominal: Soft, nontender, nondistended. Normal bowel sounds. Extremities: DP pulses 2+ and symmetric. Legs are crossed. Skin: Warm and dry.  Healing circular wound on her scalp. Neuro: Partial A&O, knows her name, location, and situation but does not know the date. Fair immediate memory, limited recent and remote memory. Fair concentration.   Labs       Latest Ref Rng & Units 05/07/2022    8:51 AM 05/06/2022    5:36 AM 05/05/2022    7:23 PM  CBC  WBC 4.0 - 10.5 K/uL 7.2  7.8  7.9   Hemoglobin 12.0 - 15.0 g/dL 27.7  82.4  23.5   Hematocrit 36.0 - 46.0 % 38.5  38.9  46.8   Platelets 150 - 400 K/uL 290  331  364       Latest Ref Rng & Units 05/08/2022    3:23 AM 05/07/2022    8:51 AM 05/06/2022    5:36 AM  BMP  Glucose 70 - 99 mg/dL 361  443  154   BUN 8 - 23 mg/dL 16  18  15    Creatinine 0.44 - 1.00 mg/dL  0.08  6.76   Sodium 135 - 145 mmol/L 141  140  138   Potassium 3.5 - 5.1 mmol/L 3.2  3.8  3.5   Chloride 98 - 111 mmol/L 112  111  108   CO2 22 - 32 mmol/L 23  19  20    Calcium 8.9 - 10.3  mg/dL 9.4  9.6  9.7    Urine culture: >100k colonies of klebsiella pneumoniae. Sensitivities are pending.  Summary  Jade [name] is a 66 y.o. female with PMH T2DM with neuropathy, muscle spasticity, HLD, HTN, CKD 3, multiple prior CVA and carotid stenosis, recurrent UTIs  who presented with concern for AMS, suprapubic pain and admitted for UTI on hospital day 3.  Assessment & Plan:  Principal Problem:   Recurrent UTI   #UTI with history of recurrent multidrug-resistant Klebsiella/Proteus UTI Hx of multiple hospitalizations, most recently on 8/16 with Klebsiella/Proteus. Urinary sxs and appetite have improved. Afebrile overnight. Recent culture shows Klebsiella, sensitivities pending. Will determine if oral regimen is appropriate and duration after sensitivities come back. - Continue IV Zosyn, day 3 - Follow-up urine culture sensitivity  #Multiple prior infarcts #Carotid stenosis s/p revascularization Neurological condition may be her current baseline status, suspecting vascular dementia.  - Continue Plavix 75 mg daily  #HTN SBP ranging in the 140s-170s, most recently in the 160s.  - Continue home  amlodipine 10 mg daily   #CKD 3a #Hypokalemia Creatinine stabilizing 0.8 > 1.2 >1.0. Potassium decreased: 3.8>3.2. Ensure dietary supplement and encourage PO intake. --Trend BMP  #T2DM with neuropathy #Muscle spasticity A1c is 5.8 which remains stable from last reading in January. CBG have ranged from 110s-180s in the last 24 hours and will continue to monitor closely. - CBGs ACHS - Continue tizanidine 2 mg nightly as needed   #HLD-stable Lipid profile WNL, LDL 65 -- Continue Lipitor    Discharge Planning: Per social work, patient was recently admitted to [facility] for SNF but the pt returned home from the facility and [facility]PS was called by the home health agency. The patient needs SNF placement and likely LTC Medicaid since the patient's family is unable to care for the patient  at the home.  Awaiting son to choose a facility. TOC consulted.   Christia Reading, MD Internal Medicine Resident PGY-1 PAGER: [phone] 05/08/2022 5:54 AM  If after hours (below), please contact on-call pager: [phone] 5PM-7AM Monday-Friday 1PM-7AM Saturday-Sunday

## 2022-05-09 DIAGNOSIS — B961 Klebsiella pneumoniae [K. pneumoniae] as the cause of diseases classified elsewhere: Secondary | ICD-10-CM | POA: Diagnosis not present

## 2022-05-09 DIAGNOSIS — B964 Proteus (mirabilis) (morganii) as the cause of diseases classified elsewhere: Secondary | ICD-10-CM | POA: Diagnosis not present

## 2022-05-09 DIAGNOSIS — N390 Urinary tract infection, site not specified: Secondary | ICD-10-CM | POA: Diagnosis not present

## 2022-05-09 LAB — GLUCOSE, CAPILLARY
Glucose-Capillary: 149 mg/dL — ABNORMAL HIGH (ref 70–99)
Glucose-Capillary: 107 mg/dL — ABNORMAL HIGH (ref 70–99)
Glucose-Capillary: 156 mg/dL — ABNORMAL HIGH (ref 70–99)
Glucose-Capillary: 124 mg/dL — ABNORMAL HIGH (ref 70–99)
Glucose-Capillary: 126 mg/dL — ABNORMAL HIGH (ref 70–99)

## 2022-05-09 MED ORDER — POTASSIUM CHLORIDE 10 MEQ/100ML IV SOLN
10.0000 meq | INTRAVENOUS | Status: AC
  Administered 2022-05-09 (×4): 10 meq via INTRAVENOUS
  Filled 2022-05-09 (×4): qty 100

## 2022-05-09 NOTE — Progress Notes (Signed)
NAME:  Jade [name], MRN:  [id number], DOB:  1955/10/28, LOS: 3 ADMISSION DATE:  05/05/2022  Subjective  NAEON  Interim history: Patient was evaluated this AM in bed. Patient had a brighter affect today and denied urinary symptoms. She notes the last urinary incontinence episode was a few weeks ago. She denies pain at this time. States her appetite is slowly getting better. Discussed plan for discharge to [facility] with her.   Objective   Blood pressure (!) 125/53, pulse (!) 59, temperature 98 F (36.7 C), resp. rate 18, weight 62.3 kg, SpO2 95 %.      Intake/Output Summary (Last 24 hours) at 05/09/2022 0705 Last data filed at 05/09/2022 0534 Gross per 24 hour  Intake 800 ml  Output 950 ml  Net -150 ml   Filed Weights   05/06/22 0737  Weight: 62.3 kg   Physical Exam: General: Pleasant in bed. No acute distress. In good mood today.  CV: RRR. No murmurs, rubs, or gallops. No LE edema Pulmonary: Lungs CTAB. Normal effort. No wheezing or rales. Abdominal: Soft, nontender, nondistended. Normal bowel sounds. Extremities: DP pulses 2+ and symmetric. Legs are crossed. Skin: Warm and dry.  Healing circular wound on her scalp. Neuro: Alert. Knows her name, location, and situation but does not know the date. Fair immediate memory, limited recent and remote memory. Fair concentration.  Labs       Latest Ref Rng & Units 05/07/2022    8:51 AM 05/06/2022    5:36 AM 05/05/2022    7:23 PM  CBC  WBC 4.0 - 10.5 K/uL 7.2  7.8  7.9   Hemoglobin 12.0 - 15.0 g/dL 26.9  48.5  46.2   Hematocrit 36.0 - 46.0 % 38.5  38.9  46.8   Platelets 150 - 400 K/uL 290  331  364       Latest Ref Rng & Units 05/08/2022    3:23 AM 05/07/2022    8:51 AM 05/06/2022    5:36 AM  BMP  Glucose 70 - 99 mg/dL 703  500  938   BUN 8 - 23 mg/dL 16  18  15    Creatinine 0.44 - 1.00 mg/dL  1.82  9.93   Sodium 135 - 145 mmol/L 141  140  138   Potassium 3.5 - 5.1 mmol/L 3.2  3.8  3.5   Chloride 98 - 111 mmol/L  112  111  108   CO2 22 - 32 mmol/L 23  19  20    Calcium 8.9 - 10.3 mg/dL 9.4  9.6  9.7    Urine culture: >100k colonies of klebsiella pneumoniae. Sensitivities show sensitivity with imipenem, macrobid, zosyn.  Summary  Jade [name] is a 66 y.o. female with PMH T2DM with neuropathy, muscle spasticity, HLD, HTN, CKD 3, multiple prior CVA and carotid stenosis, recurrent UTIs  who presented with concern for AMS, suprapubic pain and admitted for UTI on hospital day 4.  Assessment & Plan:  Principal Problem:   Recurrent UTI  #UTI with history of recurrent multidrug-resistant Klebsiella/Proteus UTI Hx of multiple hospitalizations, most recently on 8/16 with Klebsiella/Proteus. Urinary sxs and appetite have improved. She is HDS. Recent culture shows Klebsiella, with sensitivities to  imipenem, macrobid, zosyn. Her urinary symptoms and appetite continue to improve. Will discharge her on a longer course of antibiotics in the setting of her recurrent UTIs and have urology evaluate her. Pending discharge to [facility].  - Continue IV Zosyn, day 4 -- F/u with  [facility] w/ Dr. Liliane Shi on 9/22 at 9:30 am.  #Multiple prior infarcts #Carotid stenosis s/p revascularization Neurological condition may be her current baseline status, suspecting vascular dementia. Patent continues to have residual RUE and RLE extremities with increased tonicity of all extremities.  - Continue Plavix 75 mg daily - Continue PT/OT eval  --Pending SNF placement  #HTN SBP ranging in the 120s-170s, most recently in the 120s.  - Continue home amlodipine 10 mg daily   #CKD 3a #Hypokalemia Creatinine stabilizing 0.8 > 1.2 >1.0. Potassium decreased: 3.8>3.2. -- Ensure dietary supplement and encourage PO intake. -- KCl 40 mEq x1 dose -- Morning BMP  #T2DM with neuropathy #Muscle spasticity A1c is 5.8 which remains stable from last reading in January. CBG continues to range in the 110s-180s in the last 24 hours and  will continue to monitor closely. - Continue tizanidine 2 mg nightly as needed   #HLD-stable Lipid profile WNL, LDL 65 -- Continue Lipitor    Discharge Planning: Per social work, Advice worker for placement at [facility]. Insurance requesting updated notes today. Patient is medically cleared for discharge to SNF.   Christia Reading, MD Internal Medicine Resident PGY-1 PAGER: [phone] 05/09/2022 7:05 AM  If after hours (below), please contact on-call pager: [phone] 5PM-7AM Monday-Friday 1PM-7AM Saturday-Sunday     Steffanie Rainwater, MD 05/09/2022, 2:14 PM IM Resident, PGY-3 Pager: [phone] Internal Medicine Teaching Service Isaiah 41:10

## 2022-05-09 NOTE — Progress Notes (Signed)
Occupational Therapy Treatment Patient Details Name: Jade [name] MRN: [id number] DOB: 18-Jun-1956 Today's Date: 05/09/2022   History of present illness Pt is a 66 year old female admitted 9/9 for recurrent UTI. PMH: T2DM with neuropathy, HLD, HTN, CKD 3, multiple prior CVA and carotid stenosis, recurrent UTIs.   OT comments  Patient continues to make incremental progress towards goals in skilled OT session. Patient's session encompassed on increasing overall activity tolerance, ADLs sitting EOB, and stretching BLEs to promote increased mobility with functional tasks and transfers. Patient remains max to total A of 2 for bed mobility and transfers, with need for frequent multi-modal cues in order to follow commands. Patient could not follow 2 step commands in session to date. Patient benefiting from weight bearing on LUE to maintain sitting balance EOB, however if prompted to engage in other task would lose balance. Increased time spent at end of session to increase AROM to BLEs by engaging in exercises and stretching of hips to promote increased motility. OT continues to recommend SNF to progress towards functional independence in transfers and ADLs. OT will continue to follow acutely.    Recommendations for follow up therapy are one component of a multi-disciplinary discharge planning process, led by the attending physician.  Recommendations may be updated based on patient status, additional functional criteria and insurance authorization.    Follow Up Recommendations  Skilled nursing-short term rehab (<3 hours/day)    Assistance Recommended at Discharge Frequent or constant Supervision/Assistance  Patient can return home with the following  Two people to help with walking and/or transfers;A lot of help with bathing/dressing/bathroom;Assistance with cooking/housework;Assistance with feeding;Direct supervision/assist for medications management;Direct supervision/assist for financial  management;Assist for transportation;Help with stairs or ramp for entrance   Equipment Recommendations  Hospital bed;Other (comment) (hoyer lift)    Recommendations for Other Services      Precautions / Restrictions Precautions Precautions: Fall;Other (comment) Precaution Comments: R hemiparesis with hypertonicity from prior CVA Restrictions Weight Bearing Restrictions: No       Mobility Bed Mobility Overal bed mobility: Needs Assistance Bed Mobility: Rolling, Sidelying to Sit, Sit to Sidelying Rolling: Max assist, Total assist Sidelying to sit: Total assist, +2 for physical assistance     Sit to sidelying: Total assist, +2 for physical assistance General bed mobility comments: cues for log roll sequencing, pt needs increased time and max cues to initiate/perform. +2 totalA for posterior scooting toward HOB in supine with bed pads    Transfers                  Lateral/Scoot Transfers: Total assist, +2 physical assistance General transfer comment: attempted seated scooting vs squat pivot toward her L to scoot up higher along EOB; despite max cues, pt needing totalA for attempts, poor propulsion achieved.     Balance Overall balance assessment: Needs assistance Sitting-balance support: No upper extremity supported, Feet supported Sitting balance-Leahy Scale: Poor Sitting balance - Comments: Periods of unsupported balance needing min guard, transitioning to needing mod truncal assist to prevent L and posterior LOB. Worsening with fatigue and dual tasking. Postural control: Left lateral lean, Posterior lean     Standing balance comment: increased tone and fatigue, pt unable to lift hips off bed with scooting/pivotal attempts                           ADL either performed or assessed with clinical judgement   ADL Overall ADL's : Needs assistance/impaired Eating/Feeding: Maximal assistance Eating/Feeding Details (  indicate cue type and reason): notified NT  that patient is a max to total A to feed Grooming: Maximal assistance;Brushing hair Grooming Details (indicate cue type and reason): able to reach for comb successfully with L hand x1, unable to cross midline to reach additionally, decreased AROM noted in LUE to attempt to brush hair independently             Lower Body Dressing: Total assistance               Functional mobility during ADLs: Maximal assistance;+2 for physical assistance;+2 for safety/equipment General ADL Comments: Session focus on increasing overall activity tolerance, ADLs sitting EOB, and stretching BLEs to promote increased mobility with functional tasks and transfers    Extremity/Trunk Assessment              Vision       Perception     Praxis      Cognition Arousal/Alertness: Awake/alert Behavior During Therapy: Flat affect Overall Cognitive Status: No family/caregiver present to determine baseline cognitive functioning Area of Impairment: Orientation, Attention, Memory, Following commands, Awareness, Problem solving                 Orientation Level: Disoriented to, Time, Situation Current Attention Level: Selective Memory: Decreased short-term memory Following Commands: Follows one step commands with increased time   Awareness: Intellectual Problem Solving: Slow processing, Decreased initiation, Difficulty sequencing, Requires verbal cues General Comments: Pt reporting visual deficits/diplopia, need to confirm if she has glasses she can wear during sessions; pt unable to properly identify fingers held up on hands on either side of face with pt confusion with cues for lateral gaze and delayed processing.        Exercises      Shoulder Instructions       General Comments VSS on RA per chart review    Pertinent Vitals/ Pain       Pain Assessment Pain Assessment: PAINAD Breathing: normal Negative Vocalization: occasional moan/groan, low speech, negative/disapproving  quality Facial Expression: facial grimacing Body Language: tense, distressed pacing, fidgeting Consolability: no need to console PAINAD Score: 4 Pain Location: R and L arms, "shoulders" and "where I keep getting shots" Pain Descriptors / Indicators: Discomfort, Grimacing, Guarding, Numbness Pain Intervention(s): Limited activity within patient's tolerance, Monitored during session, Repositioned, Utilized relaxation techniques  Home Living                                          Prior Functioning/Environment              Frequency  Min 2X/week        Progress Toward Goals  OT Goals(current goals can now be found in the care plan section)  Progress towards OT goals: Progressing toward goals  Acute Rehab OT Goals Patient Stated Goal: none stated OT Goal Formulation: With patient Time For Goal Achievement: 05/20/22 Potential to Achieve Goals: Fair  Plan Discharge plan remains appropriate    Co-evaluation    PT/OT/SLP Co-Evaluation/Treatment: Yes Reason for Co-Treatment: Complexity of the patient's impairments (multi-system involvement);For patient/therapist safety PT goals addressed during session: Mobility/safety with mobility;Balance;Strengthening/ROM OT goals addressed during session: ADL's and self-care      AM-PAC OT "6 Clicks" Daily Activity     Outcome Measure   Help from another person eating meals?: A Lot Help from another person taking care of personal grooming?: A Lot Help  from another person toileting, which includes using toliet, bedpan, or urinal?: Total Help from another person bathing (including washing, rinsing, drying)?: Total Help from another person to put on and taking off regular upper body clothing?: Total Help from another person to put on and taking off regular lower body clothing?: Total 6 Click Score: 8    End of Session    OT Visit Diagnosis: Unsteadiness on feet (R26.81);Other abnormalities of gait and mobility  (R26.89);Muscle weakness (generalized) (M62.81);History of falling (Z91.81);Feeding difficulties (R63.3);Hemiplegia and hemiparesis;Other symptoms and signs involving cognitive function Hemiplegia - Right/Left: Right Hemiplegia - dominant/non-dominant: Dominant   Activity Tolerance Patient limited by fatigue;Patient limited by pain   Patient Left in bed;with call bell/phone within reach;with bed alarm set;Other (comment) (chair position)   Nurse Communication Mobility status        Time: 6122-4497 OT Time Calculation (min): 37 min  Charges: OT General Charges $OT Visit: 1 Visit OT Treatments $Self Care/Home Management : 8-22 mins  Pollyann Glen E. Mylik Pro, OTR/L Acute Rehabilitation Services [phone]   Cherlyn Cushing 05/09/2022, 3:57 PM

## 2022-05-09 NOTE — TOC Progression Note (Addendum)
Transition of Care [facility]) - Progression Note    Patient Details  Name: Jade [name] MRN: [id number] Date of Birth: 11/27/1955  Transition of Care [facility]) CM/SW Contact  Curlene Labrum, RN Phone Number: 05/09/2022, 1:45 PM  Clinical Narrative:    CM called and spoke with [facility] requesting updated physician progress notes and PT/OT notes.  I will updated notes in [facility] portal once available.  The patient's son has requested [facility], otherwise, patient may need to discharge back to home with home health support while Medicaid is pending.  05/09/2022 1600 - CM spoke with Lizabeth Leyden, MSW with [place] and she spoke with the patient's Medicaid caseworker and she will provide assistance with documents to transition patient's needed LTC Medicaid for placement.  The patient's son is aware and met with DSS Medicaid worker yesterday.   Expected Discharge Plan: Skilled Nursing Facility Barriers to Discharge: Continued Medical Work up  Expected Discharge Plan and Services Expected Discharge Plan: [place]         Expected Discharge Date: 05/13/22                                     Social Determinants of Health (SDOH) Interventions    Readmission Risk Interventions    05/08/2022   11:53 AM 04/09/2022    3:40 PM 06/30/2021    1:48 PM  Readmission Risk Prevention Plan  Transportation Screening Complete Complete Complete  PCP or Specialist Appt within 5-7 Days Complete    PCP or Specialist Appt within 3-5 Days  Complete Complete  Home Care Screening Complete    Medication Review (RN CM) Complete    HRI or Home Care Consult  Complete Complete  Social Work Consult for [place] Planning/Counseling  Complete Complete  Palliative Care Screening  Complete Not Applicable  Medication Review Press photographer)  Complete

## 2022-05-09 NOTE — Discharge Instructions (Addendum)
Dear [name], it has been a pleasure caring for you and I am so happy to see you doing well! You were hospitalized for a behavioral changes and concern for UTI and treated for your urinary tract infection. Since then you have recovered, you are no longer having painful peeing and have increased energy and appetite. In addition to starting antibiotics, you were also seen by a physical therapist and occupational therapist.   When you are discharged we would like you to do the following:  1. You were started on antibiotics in the hospital. Please continue them using these following instructions: -Take Macrobid for 1 day  2.  Some of your medication regimen has been changed.  Please see the following instructions for the changes: - Keep taking Plavix and stop the Aspirin   3. Continue taking the rest of the medications as you were before you came to the hospital.  4. Follow-up with the urine doctor at [facility]rology with Dr. Liliane Shi on 9/22 at 9:30 am, and your PCP

## 2022-05-09 NOTE — Progress Notes (Signed)
Physical Therapy Treatment Patient Details Name: Jade [name] MRN: [id number] DOB: 20-Jul-1956 Today's Date: 05/09/2022   History of Present Illness Pt is a 66 year old female admitted 9/9 for recurrent UTI. PMH: T2DM with neuropathy, HLD, HTN, CKD 3, multiple prior CVA and carotid stenosis, recurrent UTIs.    PT Comments    Pt received in supine, agreeable to therapy session with good participation and fair tolerance for bed mobility and seated balance tasks. Pt with increased RUE tone and L lean with seated posture that increased with fatigue and multi-tasking. Pt reports visual deficits and diplopia, no glasses in room to don, may need further visual assessment. Emphasis on static sitting, seated reaching activity, supine/seated UE and LE exercises for ROM and strengthening and repositioning to prevent skin breakdown. Pt up in chair posture with lunch in room, RN notified pt will need assist to eat her lunch due to hx CVA and fine motor control deficits. Pt continues to benefit from PT services to progress toward functional mobility goals.   Recommendations for follow up therapy are one component of a multi-disciplinary discharge planning process, led by the attending physician.  Recommendations may be updated based on patient status, additional functional criteria and insurance authorization.  Follow Up Recommendations  Skilled nursing-short term rehab (<3 hours/day) Can patient physically be transported by private vehicle: No   Assistance Recommended at Discharge Frequent or constant Supervision/Assistance  Patient can return home with the following Two people to help with walking and/or transfers;Two people to help with bathing/dressing/bathroom;Assistance with cooking/housework;Assistance with feeding;Direct supervision/assist for medications management;Direct supervision/assist for financial management;Assist for transportation;Help with stairs or ramp for entrance   Equipment  Recommendations  Hospital bed;Other (comment) (mechanical lift)    Recommendations for Other Services       Precautions / Restrictions Precautions Precautions: Fall;Other (comment) Precaution Comments: R hemiparesis with 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 +2 totalA for posterior scooting toward HOB in supine with bed pads    Transfers Overall transfer level: Needs assistance                Lateral/Scoot Transfers: Total assist, +2 physical assistance General transfer comment: attempted seated scooting vs squat pivot toward her L to scoot up higher along EOB; despite max cues, pt needing totalA for attempts, poor propulsion achieved.     Balance Overall balance assessment: Needs assistance Sitting-balance support: No upper extremity supported, Feet supported Sitting balance-Leahy Scale: Poor Sitting balance - Comments: Periods of unsupported balance needing min guard, transitioning to needing mod truncal assist to prevent L and posterior LOB. Worsening with fatigue and dual tasking. Postural control: Left lateral lean, Posterior lean     Standing balance comment: increased tone and fatigue, pt unable to lift hips off bed with scooting/pivotal attempts                            Cognition Arousal/Alertness: Awake/alert Behavior During Therapy: Flat affect Overall Cognitive Status: No family/caregiver present to determine baseline cognitive functioning Area of Impairment: Orientation, Attention, Memory, Following commands,  Awareness,  Problem solving                 Orientation Level: Disoriented to, Time, Situation Current Attention Level: Selective Memory: Decreased short-term memory Following Commands: Follows one step commands with increased time   Awareness: Intellectual Problem Solving: Slow processing, Decreased initiation, Difficulty sequencing, Requires verbal cues General Comments: Pt reporting visual deficits/diplopia, need to confirm if she has glasses she can wear during sessions; pt unable to properly identify fingers held up on hands on either side of face with pt confusion with cues for lateral gaze and delayed processing.        Exercises General Exercises - Upper Extremity Shoulder Flexion: AAROM, Left, 10 reps, Supine Elbow Flexion: AAROM, Left, 10 reps, Supine Elbow Extension: AAROM, Left, 10 reps, Other reps (comment) Wrist Flexion: AAROM, Left, 10 reps, Supine General Exercises - Lower Extremity Long Arc Quad: AAROM, 10 reps, Seated, Left Other Exercises Other Exercises: supine BLE AAROM: knee extension, hip abduction, SAQ, ankle pumps (limited on RLE needing increased assist to nearly PROM) x10 reps ea    General Comments General comments (skin integrity, edema, etc.): VSS on RA per chart review      Pertinent Vitals/Pain Pain Assessment Pain Assessment: PAINAD Breathing: normal Negative Vocalization: occasional moan/groan, low speech, negative/disapproving quality Facial Expression: facial grimacing Body Language: tense, distressed pacing, fidgeting Consolability: no need to console PAINAD Score: 4 Pain Location: R and L arms, "shoulders" and "where I keep getting shots" Pain Descriptors / Indicators: Discomfort, Grimacing, Guarding, Numbness Pain Intervention(s): Limited activity within patient's tolerance, Monitored during session, Repositioned, Other (comment), Utilized relaxation techniques ("i feel like I have gloves on" (UE increased numbness?))            PT Goals (current goals can now be found in the care plan section) Acute Rehab PT Goals Patient Stated Goal: less pain, to feel less tight PT Goal Formulation: With patient Time For Goal Achievement: 05/20/22 Progress towards PT goals: Progressing toward goals    Frequency    Min 2X/week      PT Plan Current plan remains appropriate    Co-evaluation PT/OT/SLP Co-Evaluation/Treatment: Yes Reason for Co-Treatment: To address functional/ADL transfers;For patient/therapist safety PT goals addressed during session: Mobility/safety with mobility;Balance;Strengthening/ROM        AM-PAC PT "6 Clicks" Mobility   Outcome Measure  Help needed turning from your back to your side while in a flat bed without using bedrails?: A Lot Help needed moving from lying on your back to sitting on the side of a flat bed without using bedrails?: Total Help needed moving to and from a bed to a chair (including a wheelchair)?: Total Help needed standing up from a chair using your arms (e.g., wheelchair or bedside chair)?: Total Help needed to walk in hospital room?: Total Help needed climbing 3-5 steps with a railing? : Total 6 Click Score: 7    End of Session Equipment Utilized During Treatment: Gait belt Activity Tolerance: Patient limited by pain;Other (comment) (increased R sided tone and pt fatigue limiting mobility progression) Patient left: in bed;with call bell/phone within reach;with bed alarm set;Other (comment) (pillow under RUE for pressure relief/to encouraged RUE extension and wrist/finger extension; LUE with rolled pillow under to promote neutral posture with bed in chair posture.) Nurse Communication: Mobility status;Other (comment);Need for lift equipment (pt increased tone on RUE and c/o pain) PT Visit Diagnosis: Unsteadiness on feet (R26.81);Muscle weakness (generalized) (M62.81);Difficulty in walking, not elsewhere classified (R26.2);History of falling (Z91.81);Apraxia (R48.2)  Time: 1157-2620 PT Time Calculation (min) (ACUTE ONLY): 38 min  Charges:  $Therapeutic Exercise: 8-22 mins $Neuromuscular Re-education: 8-22 mins                     Hiawatha Dressel P., PTA Acute Rehabilitation Services Secure Chat Preferred 9a-5:30pm Office: [phone]    Dorathy Kinsman [facility] 05/09/2022, 3:34 PM

## 2022-05-09 NOTE — Progress Notes (Deleted)
Name: Jade [name] MRN: [id number] DOB: 11-Jan-1956 66 y.o. PCP: Leonard Downing  Date of Admission: 05/05/2022  6:27 PM Date of Discharge: 05/11/2022 Attending Physician: Dr.  Sol Blazing  Discharge Diagnosis: Principal Problem:   Recurrent UTI    Discharge Medications: Allergies as of 05/11/2022       Reactions   Baclofen Other (See Comments)   Pt said felt weaker and had slurred speech   Nickel Dermatitis, Rash        Medication List     STOP taking these medications    aspirin EC 81 MG tablet   dantrolene 50 MG capsule Commonly known as: DANTRIUM       TAKE these medications    acetaminophen 325 MG tablet Commonly known as: TYLENOL Take 2 tablets (650 mg total) by mouth every 6 (six) hours as needed for mild pain, moderate pain, fever or headache (or temp > 37.5 C (99.5 F)). What changed: when to take this   amLODipine 10 MG tablet Commonly known as: NORVASC Take 10 mg by mouth daily.   atorvastatin 80 MG tablet Commonly known as: LIPITOR Take 80 mg by mouth every evening.   cholecalciferol 25 MCG (1000 UNIT) tablet Commonly known as: VITAMIN D3 Take 1,000 Units by mouth daily.   clopidogrel 75 MG tablet Commonly known as: Plavix Take 1 tablet (75 mg total) by mouth daily.   docusate sodium 100 MG capsule Commonly known as: COLACE Take 100 mg by mouth daily as needed for mild constipation.   DRY EYES OP Place 1 drop into both eyes 2 (two) times daily as needed (dry eyes).   feeding supplement Liqd Take 237 mLs by mouth 2 (two) times daily between meals.   nitrofurantoin (macrocrystal-monohydrate) 100 MG capsule Commonly known as: MACROBID Take 1 capsule (100 mg total) by mouth every 12 (twelve) hours for 2 days. Start taking on: May 12, 2022   pantoprazole 40 MG tablet Commonly known as: Protonix Take 1 tablet (40 mg total) by mouth 2 (two) times daily. Please take 40 mg oral twice daily x12 weeks, then take 40 mg oral once  daily.   Potassium 99 MG Tabs Take 1 tablet by mouth 3 (three) times daily.   tiZANidine 2 MG tablet Commonly known as: ZANAFLEX Take 1 tablet (2 mg total) by mouth at bedtime as needed for muscle spasms.   ZINC PO Take 1 tablet by mouth daily.        Disposition and follow-up:   [name] was discharged from [facility] in Stable condition.  At the hospital follow up visit please address:  1.  Follow-up:  a. Recurrent UTI- Patient admitted with dysuria and increased frequency. She was placed on IV Zosyn and symptoms improved. Will follow-up at [facility] with Dr. Liliane Shi on 9/22 at 9:30 am.     b. HTN-Patient's SBP ranged in the 120-170s during her hospital stay and she is on amlodipine at home. Please take patient's BP and manage medications to your discretion.   c. History of CVA- Patient has some memory deficits that remained stable throughout her stay. Consider seeing a neurologist if memory changes worsen.   2.  Labs / imaging needed at time of follow-up: none  3.  Pending labs/ test needing follow-up: none  Follow-up Appointments:  Urologist, PCP   Hospital Course by problem list:  #UTI with history of recurrent multidrug-resistant Klebsiella/Proteus UTI Hx of multiple hospitalizations, most recently on 8/16 with Klebsiella/Proteus.  She was placed on IV Zosyn. Urinary sxs and appetite has improved, and she remained HDS during hospital stay. Recent culture shows Klebsiella, with sensitivities to imipenem, macrobid, zosyn. She is discharged on macrobid for a total of 7 days on antibiotics. Follow-up with urologist due to relapse of UTI.   #Multiple prior infarcts #Carotid stenosis s/p revascularization She has a history of CVA and carotid stenosis. Patient was partially oriented during her hospital stay, consistently knowing her name, location, and situation. Her memory deficits, behavioral changes, and general functional deficits may be  her current baseline status due to her extensive encephalomalacia and history of multiple previous infarcts. Neurological condition may be her current baseline status, suspecting vascular dementia. She will continue Plavix monotherapy.   #HTN SBP was stable and ranging in the 120s-170s during her hospital stay. Continue home amlodipine 10 mg daily.   #CKD 3a #Hypokalemia On admission, Creatinine was 0.8. Creatinine was stable during her hospital stay. She was on ensure and encouraged PO intake. Potassium was repleted on 9/12.    #T2DM with neuropathy #Muscle spasticity A1c is 5.8 which remained stable from last reading in January. CBG continued to range in the 110s-180s. Continue tizanidine 2 mg nightly as needed.   Subjective: Patient was seen this AM. She notes feeling similarly to yesterday and denies urinary symptoms. When told about the discharge planning, she was tearful and said she feels like going to the LTC is a "death sentence". Patient was reassured that she will have supportive care and the importance to have assistance where she lives due to not being able to ambulate.     Discharge Vitals:   BP (!) 174/77   Pulse 63   Temp 97.9 F (36.6 C) (Oral)   Resp 17   Wt 62.3 kg   SpO2 100%   BMI 25.12 kg/m  Discharge exam: General: Pleasant, well-appearing in bed. No acute distress. Skin: Warm and dry. Neuro: alert, oriented to situation Psych: Normal mood and affect, tearful at times due to discharge planning   Pertinent Labs, Studies, and Procedures:     Latest Ref Rng & Units 05/07/2022    8:51 AM 05/06/2022    5:36 AM 05/05/2022    7:23 PM  CBC  WBC 4.0 - 10.5 K/uL 7.2  7.8  7.9   Hemoglobin 12.0 - 15.0 g/dL 91.6  38.4  66.5   Hematocrit 36.0 - 46.0 % 38.5  38.9  46.8   Platelets 150 - 400 K/uL 290  331  364        Latest Ref Rng & Units 05/10/2022    7:08 AM 05/08/2022    3:23 AM 05/07/2022    8:51 AM  CMP  Glucose 70 - 99 mg/dL 993  570  177   BUN 8 - 23  mg/dL 20  16  18    Creatinine 0.44 - 1.00 mg/dL  9.39  0.30   Sodium 135 - 145 mmol/L 142  141  140   Potassium 3.5 - 5.1 mmol/L 3.6  3.2  3.8   Chloride 98 - 111 mmol/L 111  112  111   CO2 22 - 32 mmol/L 22  23  19    Calcium 8.9 - 10.3 mg/dL 9.7  9.4  9.6     CT Renal Stone Study  Result Date: 05/05/2022 CLINICAL DATA:  Recurrent UTIs. EXAM: CT ABDOMEN AND PELVIS WITHOUT CONTRAST TECHNIQUE: Multidetector CT imaging of the abdomen and pelvis was performed following the standard protocol without IV  contrast. RADIATION DOSE REDUCTION: This exam was performed according to the departmental dose-optimization program which includes automated exposure control, adjustment of the mA and/or kV according to patient size and/or use of iterative reconstruction technique. COMPARISON:  February 15, 2022 FINDINGS: Lower chest: Mild bibasilar linear scarring and/or atelectasis is seen. Hepatobiliary: No focal liver abnormality is seen. Multiple gallstones are seen within the gallbladder fossa. These are present on the prior study. The gallbladder is not clearly identified. Pancreas: Unremarkable. No pancreatic ductal dilatation or surrounding inflammatory changes. Spleen: Normal in size without focal abnormality. Adrenals/Urinary Tract: Adrenal glands are unremarkable. Kidneys are normal, without obstructing renal calculi, focal lesion, or hydronephrosis. 2 mm calcifications are seen within the mid left kidney which may be vascular in origin. Bladder is unremarkable. Stomach/Bowel: Stomach is within normal limits. Appendix appears normal. No evidence of bowel wall thickening, distention, or inflammatory changes. Noninflamed diverticula are seen within the descending and sigmoid colon. Vascular/Lymphatic: Aortic atherosclerosis. No enlarged abdominal or pelvic lymph nodes. Reproductive: Uterus and bilateral adnexa are unremarkable. Other: Surgical mesh is seen along the anterior aspect of the lower abdomen and upper  pelvis. No abdominal wall hernia or abnormality. No abdominopelvic ascites. Musculoskeletal: Chronic compression fracture deformities are seen at the levels of T12 and L1. Multilevel degenerative changes are noted throughout the lumbar spine. IMPRESSION: 1. Cholelithiasis. 2. Colonic diverticulosis. 3. Chronic compression fracture deformities of the T12 and L1 vertebral bodies. 4. Aortic atherosclerosis. Aortic Atherosclerosis (ICD10-I70.0). Electronically Signed   By: Aram Candela M.D.   On: 05/05/2022 20:42   CT HEAD WO CONTRAST ( )  Result Date: 05/05/2022 CLINICAL DATA:  Altered mental status. EXAM: CT HEAD WITHOUT CONTRAST TECHNIQUE: Contiguous axial images were obtained from the base of the skull through the vertex without intravenous contrast. RADIATION DOSE REDUCTION: This exam was performed according to the departmental dose-optimization program which includes automated exposure control, adjustment of the mA and/or kV according to patient size and/or use of iterative reconstruction technique. COMPARISON:  March 17, 2022 FINDINGS: Brain: There is mild cerebral atrophy with widening of the extra-axial spaces and ventricular dilatation. There are areas of decreased attenuation within the white matter tracts of the supratentorial brain, consistent with microvascular disease changes. Chronic bilateral occipital lobe and chronic bilateral parietal lobe infarcts are seen. Vascular: No hyperdense vessel or unexpected calcification. Skull: Normal. Negative for fracture or focal lesion. Sinuses/Orbits: There is mild to moderate severity sphenoid sinus mucosal thickening. Other: None. IMPRESSION: 1. Generalized cerebral atrophy and chronic white matter small vessel ischemic changes buttons of an acute intracranial abnormality. 2. Chronic bilateral occipital lobe and chronic bilateral parietal lobe infarcts. Electronically Signed   By: Aram Candela M.D.   On: 05/05/2022 20:37     Discharge  Instructions: Discharge Instructions     Diet - low sodium heart healthy   Complete by: As directed    Increase activity slowly   Complete by: As directed          Discharge Instructions      Dear Ms. Rumpf, it has been a pleasure caring for you and I am so happy to see you doing well! You were hospitalized for a behavioral changes and concern for UTI and treated for your urinary tract infection. Since then you have recovered, you are no longer having painful peeing and have increased energy and appetite. In addition to starting antibiotics, you were also seen by a physical therapist and occupational therapist.   When you are discharged we would like you  to do the following:  1. You were started on antibiotics in the hospital. Please continue them using these following instructions: -Take Macrobid for 1 day  2.  Some of your medication regimen has been changed.  Please see the following instructions for the changes: - Keep taking Plavix and stop the Aspirin   3. Continue taking the rest of the medications as you were before you came to the hospital.  4. Follow-up with the urine doctor at [facility] with Dr. Liliane Shi on 9/22 at 9:30 am, and your PCP      Discharge: [facility]  Signed: Christia Reading, MD 05/11/2022, 1:31 PM   Pager: [phone]

## 2022-05-10 DIAGNOSIS — N390 Urinary tract infection, site not specified: Secondary | ICD-10-CM | POA: Diagnosis not present

## 2022-05-10 DIAGNOSIS — B961 Klebsiella pneumoniae [K. pneumoniae] as the cause of diseases classified elsewhere: Secondary | ICD-10-CM | POA: Diagnosis not present

## 2022-05-10 DIAGNOSIS — B964 Proteus (mirabilis) (morganii) as the cause of diseases classified elsewhere: Secondary | ICD-10-CM | POA: Diagnosis not present

## 2022-05-10 LAB — BASIC METABOLIC PANEL
Anion gap: 9 (ref 5–15)
BUN: 20 mg/dL (ref 8–23)
CO2: 22 mmol/L (ref 22–32)
Calcium: 9.7 mg/dL (ref 8.9–10.3)
Chloride: 111 mmol/L (ref 98–111)
Creatinine, Ser: 1.14 mg/dL — ABNORMAL HIGH (ref 0.44–1.00)
GFR, Estimated: 53 mL/min — ABNORMAL LOW (ref >60)
Glucose, Bld: 129 mg/dL — ABNORMAL HIGH (ref 70–99)
Potassium: 3.6 mmol/L (ref 3.5–5.1)
Sodium: 142 mmol/L (ref 135–145)

## 2022-05-10 LAB — CULTURE, BLOOD (ROUTINE X 2)
Culture: NO GROWTH
Culture: NO GROWTH
Special Requests: ADEQUATE

## 2022-05-10 LAB — GLUCOSE, CAPILLARY
Glucose-Capillary: 116 mg/dL — ABNORMAL HIGH (ref 70–99)
Glucose-Capillary: 96 mg/dL (ref 70–99)
Glucose-Capillary: 165 mg/dL — ABNORMAL HIGH (ref 70–99)
Glucose-Capillary: 150 mg/dL — ABNORMAL HIGH (ref 70–99)

## 2022-05-10 NOTE — TOC Progression Note (Signed)
Transition of Care [facility]) - Progression Note    Patient Details  Name: ALMINA SCHUL MRN: [id number] Date of Birth: Dec 30, 1955  Transition of Care [facility]) CM/SW Contact  Janae Bridgeman, RN Phone Number: 05/10/2022, 3:03 PM  Clinical Narrative:    [place] [facility] coordinator called through [facility] and requesting a peer to peer with the attending physician, Dr. Blanche East.  I sent a message to the attending physician, Dr. Blanche East and requested that she call the [facility] contact at [phone] option 5 and schedule the peer to peer before 10 am on 05/11/2022.  MD was provided the [facility] subscriber # listed on the facesheet.  CM will follow up.   Expected Discharge Plan: Skilled Nursing Facility Barriers to Discharge: Continued Medical Work up  Expected Discharge Plan and Services Expected Discharge Plan: Skilled Nursing Facility         Expected Discharge Date: 05/13/22                                     Social Determinants of Health (SDOH) Interventions    Readmission Risk Interventions    05/08/2022   11:53 AM 04/09/2022    3:40 PM 06/30/2021    1:48 PM  Readmission Risk Prevention Plan  Transportation Screening Complete Complete Complete  PCP or Specialist Appt within 5-7 Days Complete    PCP or Specialist Appt within 3-5 Days  Complete Complete  Home Care Screening Complete    Medication Review (RN CM) Complete    HRI or Home Care Consult  Complete Complete  Social Work Consult for Recovery Care Planning/Counseling  Complete Complete  Palliative Care Screening  Complete Not Applicable  Medication Review Oceanographer)  Complete

## 2022-05-10 NOTE — Progress Notes (Addendum)
NAME:  Jade [name], MRN:  [id number], DOB:  08-14-1956, LOS: 4 ADMISSION DATE:  05/05/2022  Subjective  NAEON  Interim history: Patient was seen in bed this morning.  When asked how she is feeling, she says "I have been better but I definitely could be doing a whole lot worse".  Denies dysuria.  Notes fair appetite, eating some of her meals.  Objective   Blood pressure (!) 152/72, pulse 76, temperature 98 F (36.7 C), temperature source Oral, resp. rate 18, weight 62.3 kg, SpO2 100 %.      Intake/Output Summary (Last 24 hours) at 05/10/2022 0617 Last data filed at 05/09/2022 2318 Gross per 24 hour  Intake 118 ml  Output 600 ml  Net -482 ml   Filed Weights   05/06/22 0737  Weight: 62.3 kg   Physical Exam: General: Pleasant, well-appearing  in bed. No acute distress. CV: RRR. No murmurs, rubs, or gallops. No LE edema Pulmonary: Lungs CTAB. Normal effort. No wheezing or rales. Abdominal: Soft, nontender, nondistended. Normal bowel sounds. Skin: Warm and dry. Neuro: alert, oriented to situation Psych: Normal mood and affect   Labs       Latest Ref Rng & Units 05/07/2022    8:51 AM 05/06/2022    5:36 AM 05/05/2022    7:23 PM  CBC  WBC 4.0 - 10.5 K/uL 7.2  7.8  7.9   Hemoglobin 12.0 - 15.0 g/dL 05.3  97.6  73.4   Hematocrit 36.0 - 46.0 % 38.5  38.9  46.8   Platelets 150 - 400 K/uL 290  331  364       Latest Ref Rng & Units 05/10/2022    7:08 AM 05/08/2022    3:23 AM 05/07/2022    8:51 AM  BMP  Glucose 70 - 99 mg/dL 193  790  240   BUN 8 - 23 mg/dL 20  16  18    Creatinine 0.44 - 1.00 mg/dL  9.73  5.32   Sodium 135 - 145 mmol/L 142  141  140   Potassium 3.5 - 5.1 mmol/L 3.6  3.2  3.8   Chloride 98 - 111 mmol/L 111  112  111   CO2 22 - 32 mmol/L 22  23  19    Calcium 8.9 - 10.3 mg/dL 9.7  9.4  9.6    Urine culture: >100k colonies of klebsiella pneumoniae. Sensitivities show sensitivity with imipenem, macrobid, zosyn.  Summary  Jade [name] is a 66 y.o.  female with PMH T2DM with neuropathy, muscle spasticity, HLD, HTN, CKD 3, multiple prior CVA and carotid stenosis, recurrent UTIs  who presented with concern for AMS, suprapubic pain and admitted for UTI on hospital day 5.  Assessment & Plan:  Principal Problem:   Recurrent UTI  #UTI with history of recurrent multidrug-resistant Klebsiella/Proteus UTI Hx of multiple hospitalizations, most recently on 8/16 with Klebsiella/Proteus. She is HDS and symptomatically improving. Recent culture shows Klebsiella, with sensitivities to imipenem, macrobid, zosyn. Her urinary symptoms and appetite continue to improve. Will discharge her on a longer course of antibiotics in the setting of her recent UTI with the same bacteria, concerning for recurrence vs relapse. Referral to urology following discharge. Pending discharge to [facility].  - Continue IV Zosyn, day 5 -- F/u with [facility] w/ Dr. 9/16 on 9/22 at 9:30 am.  #Multiple prior infarcts #Carotid stenosis s/p revascularization Neurological condition may be her current baseline status, suspecting vascular dementia. Patent continues to have residual RUE  and RLE extremity deficits with increased tonicity of all extremities.  -Continue Plavix 75 mg daily --Continue PT/OT eval  --Pending SNF placement  #HTN SBP ranging in the 120s-170s, most recently in the 150s.  - Continue home amlodipine 10 mg daily   #CKD 3a #Hypokalemia Creatinine trend: 1.0>1.14. Potassium increased after KCl administration and WNL at 3.6. -- Ensure dietary supplement and encourage PO intake.  #T2DM with neuropathy #Muscle spasticity A1c is 5.8 which remains stable from last reading in January. CBG continues to range in the 110s-180s in the last 24 hours and will continue to monitor closely. - Continue tizanidine 2 mg nightly as needed   #HLD-stable Lipid profile WNL, LDL 65 -- Continue Lipitor    Discharge Planning: Per social work, Production assistant, radio for placement at [facility]. Insurance requesting updated notes today. Patient is medically cleared for discharge to SNF.   Christia Reading, MD Internal Medicine Resident PGY-1 PAGER: [phone] 05/10/2022 6:17 AM  If after hours (below), please contact on-call pager: [phone] 5PM-7AM Monday-Friday 1PM-7AM Saturday-Sunday

## 2022-05-10 NOTE — Progress Notes (Signed)
CSW inquired in [place] [employer] to determine patient's Medicaid status - patient has active benefits. Patient's Medicaid number is [id number] N.   Edwin Dada, MSW, LCSW Transitions of Care  Clinical Social Worker II [phone]

## 2022-05-11 DIAGNOSIS — N390 Urinary tract infection, site not specified: Secondary | ICD-10-CM | POA: Diagnosis not present

## 2022-05-11 DIAGNOSIS — B961 Klebsiella pneumoniae [K. pneumoniae] as the cause of diseases classified elsewhere: Secondary | ICD-10-CM | POA: Diagnosis not present

## 2022-05-11 DIAGNOSIS — B964 Proteus (mirabilis) (morganii) as the cause of diseases classified elsewhere: Secondary | ICD-10-CM | POA: Diagnosis not present

## 2022-05-11 LAB — GLUCOSE, CAPILLARY
Glucose-Capillary: 123 mg/dL — ABNORMAL HIGH (ref 70–99)
Glucose-Capillary: 127 mg/dL — ABNORMAL HIGH (ref 70–99)
Glucose-Capillary: 106 mg/dL — ABNORMAL HIGH (ref 70–99)
Glucose-Capillary: 117 mg/dL — ABNORMAL HIGH (ref 70–99)

## 2022-05-11 MED ORDER — NITROFURANTOIN MONOHYD MACRO 100 MG PO CAPS: 100.0000 mg | ORAL_CAPSULE | Freq: Two times a day (BID) | ORAL | 0 refills | Status: AC

## 2022-05-11 MED ORDER — NITROFURANTOIN MONOHYD MACRO 100 MG PO CAPS: 100.0000 mg | ORAL_CAPSULE | Freq: Two times a day (BID) | ORAL | Status: DC

## 2022-05-11 NOTE — Progress Notes (Addendum)
NAME:  Jade [name], MRN:  [id number], DOB:  [birth year], LOS: 5 ADMISSION DATE:  05/05/2022  Subjective  NAEON  Interim history: Patient was seen this AM. She notes feeling similarly to yesterday and denies urinary symptoms. When told about the discharge planning, she was tearful and said she feels like going to the LTC is a "death sentence". Patient was reassured that she will have supportive care and the importance to have assistance where she lives due to not being able to ambulate.   Objective   Blood pressure (!) 174/77, pulse 63, temperature 97.9 F (36.6 C), temperature source Oral, resp. rate 17, weight 62.3 kg, SpO2 100 %.      Intake/Output Summary (Last 24 hours) at 05/11/2022 1125 Last data filed at 05/11/2022 0556 Gross per 24 hour  Intake --  Output 1950 ml  Net -1950 ml   Filed Weights   05/06/22 0737  Weight: 62.3 kg   Physical Exam: General: Pleasant, well-appearing in bed. No acute distress. Skin: Warm and dry. Neuro: alert, oriented to situation Psych: Normal mood and affect, tearful at times due to discharge planning   Labs       Latest Ref Rng & Units 05/07/2022    8:51 AM 05/06/2022    5:36 AM 05/05/2022    7:23 PM  CBC  WBC 4.0 - 10.5 K/uL 7.2  7.8  7.9   Hemoglobin 12.0 - 15.0 g/dL 63.7  85.8  85.0   Hematocrit 36.0 - 46.0 % 38.5  38.9  46.8   Platelets 150 - 400 K/uL 290  331  364       Latest Ref Rng & Units 05/10/2022    7:08 AM 05/08/2022    3:23 AM 05/07/2022    8:51 AM  BMP  Glucose 70 - 99 mg/dL 277  412  878   BUN 8 - 23 mg/dL 20  16  18    Creatinine 0.44 - 1.00 mg/dL  6.76  7.20   Sodium 135 - 145 mmol/L 142  141  140   Potassium 3.5 - 5.1 mmol/L 3.6  3.2  3.8   Chloride 98 - 111 mmol/L 111  112  111   CO2 22 - 32 mmol/L 22  23  19    Calcium 8.9 - 10.3 mg/dL 9.7  9.4  9.6    Urine culture: >100k colonies of klebsiella pneumoniae. Sensitivities show sensitivity with imipenem, macrobid, zosyn.  Summary  Jade [name]  is a 66 y.o. female with PMH T2DM with neuropathy, muscle spasticity, HLD, HTN, CKD 3, multiple prior CVA and carotid stenosis, recurrent UTIs  who presented with concern for AMS, suprapubic pain and admitted for UTI on hospital day 6.  Assessment & Plan:  Principal Problem:   Recurrent UTI  #UTI with history of recurrent multidrug-resistant Klebsiella/Proteus UTI Hx of multiple hospitalizations, most recently on 8/16 with Klebsiella/Proteus. She is HDS and symptomatically improving. Recent culture shows Klebsiella, with sensitivities to imipenem, macrobid, zosyn. Her urinary symptoms and appetite continue to improve. Referral to urology following discharge. Patient denied to SNF as she is not needing acute assistance in a skilled nursing facility. Pending discharge to LTC as APS notes that patient cannot return home. - Continue IV Zosyn, day 6. Will d/c tomorrow for a total of 7 days  -- F/u with [facility] w/ Dr. 79 on 9/22 at 9:30 am.  #Multiple prior infarcts #Carotid stenosis s/p revascularization Neurological condition may be her current baseline status, suspecting vascular  dementia. Patent continues to have residual RUE and RLE extremity deficits with increased tonicity of all extremities.  -Continue Plavix 75 mg daily --Continue PT/OT eval  --Pending LTC placement  #HTN SBP ranging in the 120s-170s, most recently in the 170s.  - Continue home amlodipine 10 mg daily   #CKD 3a #Hypokalemia Creatinine trend: 1.0>1.14. Potassium increased after KCl administration and WNL at 3.6. -- Ensure dietary supplement and encourage PO intake.  #T2DM with neuropathy #Muscle spasticity A1c is 5.8 which remains stable from last reading in January. CBG continues to range in the 110s-180s in the last 24 hours and will continue to monitor closely. - Continue tizanidine 2 mg nightly as needed   #HLD-stable Lipid profile WNL, LDL 65 -- Continue Lipitor    Discharge Planning: Per social  work, pending LTC placement due to being denied to SNF.   Christia Reading, MD Internal Medicine Resident PGY-1 PAGER: [phone] 05/11/2022 11:25 AM  If after hours (below), please contact on-call pager: [phone] 5PM-7AM Monday-Friday 1PM-7AM Saturday-Sunday

## 2022-05-11 NOTE — Plan of Care (Signed)

## 2022-05-11 NOTE — Discharge Summary (Addendum)
Name: Jade [name] MRN: [id number] DOB: 03-25-1956 66 y.o. PCP: Leonard Downing  Date of Admission: 05/05/2022  6:27 PM Date of Discharge: 05/11/2022 Attending Physician: Dr.  Sol Blazing  Discharge Diagnosis: Principal Problem:   Recurrent UTI    Discharge Medications: Allergies as of 05/11/2022       Reactions   Baclofen Other (See Comments)   Pt said felt weaker and had slurred speech   Nickel Dermatitis, Rash        Medication List     STOP taking these medications    aspirin EC 81 MG tablet   dantrolene 50 MG capsule Commonly known as: DANTRIUM       TAKE these medications    acetaminophen 325 MG tablet Commonly known as: TYLENOL Take 2 tablets (650 mg total) by mouth every 6 (six) hours as needed for mild pain, moderate pain, fever or headache (or temp > 37.5 C (99.5 F)). What changed: when to take this   amLODipine 10 MG tablet Commonly known as: NORVASC Take 10 mg by mouth daily.   atorvastatin 80 MG tablet Commonly known as: LIPITOR Take 80 mg by mouth every evening.   cholecalciferol 25 MCG (1000 UNIT) tablet Commonly known as: VITAMIN D3 Take 1,000 Units by mouth daily.   clopidogrel 75 MG tablet Commonly known as: Plavix Take 1 tablet (75 mg total) by mouth daily.   docusate sodium 100 MG capsule Commonly known as: COLACE Take 100 mg by mouth daily as needed for mild constipation.   DRY EYES OP Place 1 drop into both eyes 2 (two) times daily as needed (dry eyes).   feeding supplement Liqd Take 237 mLs by mouth 2 (two) times daily between meals.   nitrofurantoin (macrocrystal-monohydrate) 100 MG capsule Commonly known as: MACROBID Take 1 capsule (100 mg total) by mouth every 12 (twelve) hours for 2 days. Start taking on: May 12, 2022   pantoprazole 40 MG tablet Commonly known as: Protonix Take 1 tablet (40 mg total) by mouth 2 (two) times daily. Please take 40 mg oral twice daily x12 weeks, then take 40 mg oral once  daily.   Potassium 99 MG Tabs Take 1 tablet by mouth 3 (three) times daily.   tiZANidine 2 MG tablet Commonly known as: ZANAFLEX Take 1 tablet (2 mg total) by mouth at bedtime as needed for muscle spasms.   ZINC PO Take 1 tablet by mouth daily.        Disposition and follow-up:   Jade [name] was discharged from [facility] in Stable condition.  At the hospital follow up visit please address:  1.  Follow-up:  a. Recurrent UTI- Patient admitted with dysuria and increased frequency. She was placed on IV Zosyn and symptoms improved. Will follow-up at [facility] with Dr. Liliane Shi on 9/22 at 9:30 am.     b. HTN-Patient's SBP ranged in the 120-170s during her hospital stay and she is on amlodipine at home. Please continue to monitor BP.   c. History of CVA- Patient has some memory deficits that remained stable throughout her stay. Consider seeing a neurologist if memory changes worsen.   2.  Labs / imaging needed at time of follow-up: none  3.  Pending labs/ test needing follow-up: none  Follow-up Appointments:  Urologist, PCP   Hospital Course by problem list:  #UTI with history of recurrent multidrug-resistant Klebsiella/Proteus UTI Hx of multiple hospitalizations, most recently on 8/16 with Klebsiella/Proteus. She was placed on IV  Zosyn. Urinary sxs and appetite has improved, and she remained HDS during hospital stay. Recent culture shows Klebsiella, with sensitivities to imipenem, macrobid, zosyn. She is discharged on macrobid for a total of 7 days on antibiotics. Follow-up with urologist due to relapse/recurrence of UTI.   #Multiple prior infarcts #Carotid stenosis s/p revascularization She has a history of CVA and carotid stenosis. Patient was partially oriented during her hospital stay, consistently knowing her name, location, and situation. Her memory deficits, behavioral changes, and general functional deficits may be her current baseline  status due to her extensive encephalomalacia and history of multiple previous infarcts. Neurological condition may be her current baseline status, suspecting vascular dementia. She will continue Plavix monotherapy.   #HTN SBP was stable and ranging in the 120s-170s during her hospital stay. Continue home amlodipine 10 mg daily.   #CKD 3a #Hypokalemia On admission, Creatinine was 0.8. Creatinine was stable during her hospital stay. She was on ensure and encouraged PO intake. Potassium was repleted on 9/12.    #T2DM with neuropathy #Muscle spasticity A1c is 5.8 which remained stable from last reading in January. CBG continued to range in the 110s-180s. Continue tizanidine 2 mg nightly as needed.   Subjective: Patient was seen this AM. She notes feeling similarly to yesterday and denies urinary symptoms. When told about the discharge planning, she was tearful and said she feels like going to the LTC is a "death sentence". Patient was reassured that she will have supportive care and the importance to have assistance where she lives due to not being able to ambulate.     Discharge Vitals:   BP (!) 174/77   Pulse 63   Temp 97.9 F (36.6 C) (Oral)   Resp 17   Wt 62.3 kg   SpO2 100%   BMI 25.12 kg/m  Discharge exam: General: Pleasant, well-appearing in bed. No acute distress. Skin: Warm and dry. Neuro: alert, oriented to situation Psych: Normal mood and affect, tearful at times due to discharge planning   Pertinent Labs, Studies, and Procedures:     Latest Ref Rng & Units 05/07/2022    8:51 AM 05/06/2022    5:36 AM 05/05/2022    7:23 PM  CBC  WBC 4.0 - 10.5 K/uL 7.2  7.8  7.9   Hemoglobin 12.0 - 15.0 g/dL 64.3  32.9  51.8   Hematocrit 36.0 - 46.0 % 38.5  38.9  46.8   Platelets 150 - 400 K/uL 290  331  364        Latest Ref Rng & Units 05/10/2022    7:08 AM 05/08/2022    3:23 AM 05/07/2022    8:51 AM  CMP  Glucose 70 - 99 mg/dL 841  660  630   BUN 8 - 23 mg/dL 20  16  18     Creatinine 0.44 - 1.00 mg/dL  1.60  1.09   Sodium 135 - 145 mmol/L 142  141  140   Potassium 3.5 - 5.1 mmol/L 3.6  3.2  3.8   Chloride 98 - 111 mmol/L 111  112  111   CO2 22 - 32 mmol/L 22  23  19    Calcium 8.9 - 10.3 mg/dL 9.7  9.4  9.6     CT Renal Stone Study  Result Date: 05/05/2022 CLINICAL DATA:  Recurrent UTIs. EXAM: CT ABDOMEN AND PELVIS WITHOUT CONTRAST TECHNIQUE: Multidetector CT imaging of the abdomen and pelvis was performed following the standard protocol without IV contrast. RADIATION DOSE REDUCTION: This  exam was performed according to the departmental dose-optimization program which includes automated exposure control, adjustment of the mA and/or kV according to patient size and/or use of iterative reconstruction technique. COMPARISON:  February 15, 2022 FINDINGS: Lower chest: Mild bibasilar linear scarring and/or atelectasis is seen. Hepatobiliary: No focal liver abnormality is seen. Multiple gallstones are seen within the gallbladder fossa. These are present on the prior study. The gallbladder is not clearly identified. Pancreas: Unremarkable. No pancreatic ductal dilatation or surrounding inflammatory changes. Spleen: Normal in size without focal abnormality. Adrenals/Urinary Tract: Adrenal glands are unremarkable. Kidneys are normal, without obstructing renal calculi, focal lesion, or hydronephrosis. 2 mm calcifications are seen within the mid left kidney which may be vascular in origin. Bladder is unremarkable. Stomach/Bowel: Stomach is within normal limits. Appendix appears normal. No evidence of bowel wall thickening, distention, or inflammatory changes. Noninflamed diverticula are seen within the descending and sigmoid colon. Vascular/Lymphatic: Aortic atherosclerosis. No enlarged abdominal or pelvic lymph nodes. Reproductive: Uterus and bilateral adnexa are unremarkable. Other: Surgical mesh is seen along the anterior aspect of the lower abdomen and upper pelvis. No abdominal  wall hernia or abnormality. No abdominopelvic ascites. Musculoskeletal: Chronic compression fracture deformities are seen at the levels of T12 and L1. Multilevel degenerative changes are noted throughout the lumbar spine. IMPRESSION: 1. Cholelithiasis. 2. Colonic diverticulosis. 3. Chronic compression fracture deformities of the T12 and L1 vertebral bodies. 4. Aortic atherosclerosis. Aortic Atherosclerosis (ICD10-I70.0). Electronically Signed   By: Aram Candela M.D.   On: 05/05/2022 20:42   CT HEAD WO CONTRAST ( )  Result Date: 05/05/2022 CLINICAL DATA:  Altered mental status. EXAM: CT HEAD WITHOUT CONTRAST TECHNIQUE: Contiguous axial images were obtained from the base of the skull through the vertex without intravenous contrast. RADIATION DOSE REDUCTION: This exam was performed according to the departmental dose-optimization program which includes automated exposure control, adjustment of the mA and/or kV according to patient size and/or use of iterative reconstruction technique. COMPARISON:  March 17, 2022 FINDINGS: Brain: There is mild cerebral atrophy with widening of the extra-axial spaces and ventricular dilatation. There are areas of decreased attenuation within the white matter tracts of the supratentorial brain, consistent with microvascular disease changes. Chronic bilateral occipital lobe and chronic bilateral parietal lobe infarcts are seen. Vascular: No hyperdense vessel or unexpected calcification. Skull: Normal. Negative for fracture or focal lesion. Sinuses/Orbits: There is mild to moderate severity sphenoid sinus mucosal thickening. Other: None. IMPRESSION: 1. Generalized cerebral atrophy and chronic white matter small vessel ischemic changes buttons of an acute intracranial abnormality. 2. Chronic bilateral occipital lobe and chronic bilateral parietal lobe infarcts. Electronically Signed   By: Aram Candela M.D.   On: 05/05/2022 20:37     Discharge Instructions: Discharge  Instructions     Diet - low sodium heart healthy   Complete by: As directed    Increase activity slowly   Complete by: As directed          Discharge Instructions      Dear Jade [name], it has been a pleasure caring for you and I am so happy to see you doing well! You were hospitalized for a behavioral changes and concern for UTI and treated for your urinary tract infection. Since then you have recovered, you are no longer having painful peeing and have increased energy and appetite. In addition to starting antibiotics, you were also seen by a physical therapist and occupational therapist.   When you are discharged we would like you to do the following:  1. You were started on antibiotics in the hospital. Please continue them using these following instructions: -Take Macrobid for 1 day  2.  Some of your medication regimen has been changed.  Please see the following instructions for the changes: - Keep taking Plavix and stop the Aspirin   3. Continue taking the rest of the medications as you were before you came to the hospital.  4. Follow-up with the urine doctor at [facility] with Dr. Liliane Shi on 9/22 at 9:30 am, and your PCP     Discharge: [facility]  Signed: Christia Reading, MD 05/11/2022, 1:33 PM   Pager: [phone]

## 2022-05-11 NOTE — Progress Notes (Addendum)
Physical Therapy Treatment Patient Details Name: Jade [name] MRN: [id number] DOB: June 10, 1956 Today's Date: 05/11/2022   History of Present Illness Pt is a 66 year old female admitted 9/9 for recurrent UTI. PMH: T2DM with neuropathy, HLD, HTN, CKD 3, multiple prior CVA and carotid stenosis, recurrent UTIs.    PT Comments    Pt received in supine, lunch tray untouched in front of her (RN notified, pt likely needs full supervision for meals), pt agreeable to therapy session with encouragement. Pt with good participation in seated balance tasks with long sitting in bed, supine/seated bed posture LE and UE exercises. Pt needing up to maxA for supine to long sitting using L bed rail and trunk support from elevated trunk posture and only able to tolerate ~90 seconds at a time. Pt continues to benefit from PT services to progress toward functional mobility goals.   Recommendations for follow up therapy are one component of a multi-disciplinary discharge planning process, led by the attending physician.  Recommendations may be updated based on patient status, additional functional criteria and insurance authorization.  Follow Up Recommendations  Skilled nursing-short term rehab (<3 hours/day) Can patient physically be transported by private vehicle: No   Assistance Recommended at Discharge Frequent or constant Supervision/Assistance  Patient can return home with the following Two people to help with walking and/or transfers;Two people to help with bathing/dressing/bathroom;Assistance with cooking/housework;Assistance with feeding;Direct supervision/assist for medications management;Direct supervision/assist for financial management;Assist for transportation;Help with stairs or ramp for entrance   Equipment Recommendations  Hospital bed;Other (comment) (mechanical lift)    Recommendations for Other Services       Precautions / Restrictions Precautions Precautions: Fall;Other  (comment) Precaution Comments: R hemiparesis with hypertonicity from prior CVA Restrictions Weight Bearing Restrictions: No     Mobility  Bed Mobility Overal bed mobility: Needs Assistance Bed Mobility: Supine to Sit, Sit to Supine     Supine to sit: Max assist, HOB elevated Sit to supine: Max assist, HOB elevated   General bed mobility comments: pt using LUE on bed rail to pull to long sitting posture (bed in chair egress posture and HOB ~60* raised when pt initiating), pt able to maintain long sitting ~60-90 seconds at a time with rest breaks due to L shoulder pain/fatigue. maxA trunk support to raise up and mod/maxA to maintain with LUE supported.    Transfers                   General transfer comment: poor balance in long sit and pt unable to sequence body mechanics for seated scooting with +1-2 assist, defer OOB via lift as PTAR already called and pt likely to discharge soon.     Modified Rankin (Stroke Patients Only) Modified Rankin (Stroke Patients Only) Pre-Morbid Rankin Score: Severe disability (hx multiple prior CVA)     Balance Overall balance assessment: Needs assistance Sitting-balance support: Single extremity supported Sitting balance-Leahy Scale: Poor Sitting balance - Comments: with LUE supported on bed rail, pt needing mod/maxA for long sitting in bed due to posterior lean                                    Cognition Arousal/Alertness: Awake/alert Behavior During Therapy: Flat affect Overall Cognitive Status: No family/caregiver present to determine baseline cognitive functioning Area of Impairment: Orientation, Attention, Memory, Following commands, Awareness, Problem solving, Safety/judgement  Orientation Level: Disoriented to, Time, Situation Current Attention Level: Selective Memory: Decreased short-term memory Following Commands: Follows one step commands with increased time Safety/Judgement: Decreased  awareness of safety, Decreased awareness of deficits Awareness: Intellectual Problem Solving: Slow processing, Decreased initiation, Difficulty sequencing, Requires verbal cues, Requires tactile cues General Comments: Pt expressed sadness about social situation but vague and reports she is going home "soon" although per chart review plan is for SNF today. Pt with difficulty maintaining visual focus on tasks and with wandering gaze and possible decreased L sided awareness (although baseline R hemiplegia) so may be more related to overall cognitive deficit than prior CVA. No glasses in room to don. Pt with pill in bed and full tray of lunch untouched in room, pt reports "not feeling like eating" but noted BUE pain at shoulders and pt likely needs full supervision for meals, RN notified pt will need encouragement and physical assist to eat and ensure completion with pills/meals.        Exercises General Exercises - Upper Extremity Shoulder Flexion: AAROM, Left, 10 reps, Supine, Right (AA on R in pain-free range) Elbow Flexion: AROM, Left, 10 reps, Supine, AAROM (AA on R in pain-free range) Wrist Flexion: AROM, Right, 10 reps, Supine, Left (R limited ROM due to hx CVA but able to move somewhat) Wrist Extension: AROM, Right, 10 reps, Supine (R limited ROM due to hx CVA but able to move somewhat) General Exercises - Lower Extremity Long Arc Quad: AAROM, Seated, AROM, Both, 10 reps, Other (comment) (bed in chair posture) Hip Flexion/Marching: 10 reps, Seated, AROM, Both (bed in chair posture) Other Exercises Other Exercises: supine BLE AAROM: knee extension, hip abduction, ankle pumps (limited on RLE needing increased assist to nearly PROM), hip IR/ER x10 reps ea Other Exercises: BLE positioned with pillows distally to encourage gravity assisted knee extension in supine, pt with tight hamstrings and tone and unable to achieve TKE bilaterally; hooklying bridges in supine with B feet supported x 5 reps     General Comments General comments (skin integrity, edema, etc.): pt with full lunch tray in room untouched, RN notified pt likely to need totalA/full supervision to eat her meals due to cognitive deficit and poor vision.      Pertinent Vitals/Pain Pain Assessment Pain Assessment: Faces Faces Pain Scale: Hurts even more Breathing: normal Consolability: no need to console Pain Location: R and L arms, "shoulders" and "where I keep getting shots" Pain Descriptors / Indicators: Discomfort, Grimacing, Guarding Pain Intervention(s): Monitored during session, Limited activity within patient's tolerance, Repositioned           PT Goals (current goals can now be found in the care plan section) Acute Rehab PT Goals Patient Stated Goal: less pain, to feel less tight PT Goal Formulation: With patient Time For Goal Achievement: 05/20/22 Progress towards PT goals: Progressing toward goals    Frequency    Min 2X/week      PT Plan Current plan remains appropriate       AM-PAC PT "6 Clicks" Mobility   Outcome Measure  Help needed turning from your back to your side while in a flat bed without using bedrails?: A Lot Help needed moving from lying on your back to sitting on the side of a flat bed without using bedrails?: Total Help needed moving to and from a bed to a chair (including a wheelchair)?: Total Help needed standing up from a chair using your arms (e.g., wheelchair or bedside chair)?: Total Help needed to walk in  hospital room?: Total Help needed climbing 3-5 steps with a railing? : Total 6 Click Score: 7    End of Session   Activity Tolerance: Patient tolerated treatment well;Patient limited by fatigue Patient left: in bed;with call bell/phone within reach;with bed alarm set;Other (comment) (heels floated, bed in chair posture with tray table set up in front, RN/US notified pt will need full assist/supervision to eat) Nurse Communication: Mobility status;Other  (comment);Need for lift equipment (pill found in bed (on tray table), pt need assist/supervision to eat) PT Visit Diagnosis: Unsteadiness on feet (R26.81);Muscle weakness (generalized) (M62.81);Difficulty in walking, not elsewhere classified (R26.2);History of falling (Z91.81);Apraxia (R48.2)     Time: 4287-6811 PT Time Calculation (min) (ACUTE ONLY): 26 min  Charges:  $Therapeutic Exercise: 8-22 mins $Therapeutic Activity: 8-22 mins                     Jonatan Wilsey P., PTA Acute Rehabilitation Services Secure Chat Preferred 9a-5:30pm Office: [phone]    Dorathy Kinsman [facility] 05/11/2022, 3:37 PM

## 2022-05-11 NOTE — TOC Progression Note (Addendum)
Transition of Care [facility]) - Progression Note    Patient Details  Name: JACARI KIRSTEN MRN: [id number] Date of Birth: 04-23-1956  [facility] CM/SW Contact  Janae Bridgeman, RN Phone Number: 05/11/2022, 9:07 AM  Clinical Narrative:    CM spoke with [facility] this morning and the patient has been denied admission to SNF since she is likely back to her physical baseline.  I called and spoke with Primitivo Gauze, APS and she states that the patient is unsafe to return home to be cared for by the patient's son and ex-husband in the home.  APS is requesting LTC placement for the patient.  I spoke with [facility]missions CM, Nikki, this morning and the bed at [facility] can no longer be offered.  Katrinka Blazing, APS states that she has had conversations with the patient's son and has explained to him that the patient will need LTC placement and the patient is able to sign her self into the LTC facility for care.  Katrinka Blazing, APS states that she will continue to follow the case for LTC placement.  I called and spoke with Olegario Messier, [facility] and asked for LTC bed at the facility.  [facility] will discuss with the business Production designer, theatre/television/film and will follow up with me this morning regarding possible bed offer.  LTC Medicaid is pending at this time.  05/11/2022 1355 - I spoke with the patient at the bedside and the patient has been updated regarding bed placement at [facility].  The patient's son was updated.  Primitivo Gauze, APS SW is aware of patient's transfer to the facility today for LTC. Discharge summary and orders were updated in the hub.  Bedside nursing - please call report to [facility] at [phone].  [facility] was called for transport and discharge packet includes Discharge summary, Medical necessity, facesheet and DNR.     Expected Discharge Plan: Skilled Nursing Facility Barriers to Discharge: Continued Medical Work up  Expected  Discharge Plan and Services Expected Discharge Plan: Skilled Nursing Facility         Expected Discharge Date: 05/13/22                                     Social Determinants of Health (SDOH) Interventions    Readmission Risk Interventions    05/08/2022   11:53 AM 04/09/2022    3:40 PM 06/30/2021    1:48 PM  Readmission Risk Prevention Plan  Transportation Screening Complete Complete Complete  PCP or Specialist Appt within 5-7 Days Complete    PCP or Specialist Appt within 3-5 Days  Complete Complete  Home Care Screening Complete    Medication Review (RN CM) Complete    HRI or Home Care Consult  Complete Complete  Social Work Consult for Recovery Care Planning/Counseling  Complete Complete  Palliative Care Screening  Complete Not Applicable  Medication Review Oceanographer)  Complete

## 2023-01-10 ENCOUNTER — Non-Acute Institutional Stay: Payer: 59 | Admitting: Hospice

## 2023-01-10 DIAGNOSIS — R5381 Other malaise: Secondary | ICD-10-CM

## 2023-01-10 DIAGNOSIS — F339 Major depressive disorder, recurrent, unspecified: Secondary | ICD-10-CM

## 2023-01-10 DIAGNOSIS — I69398 Other sequelae of cerebral infarction: Secondary | ICD-10-CM

## 2023-01-10 DIAGNOSIS — R634 Abnormal weight loss: Secondary | ICD-10-CM

## 2023-01-10 DIAGNOSIS — Z515 Encounter for palliative care: Secondary | ICD-10-CM

## 2023-01-10 NOTE — Progress Notes (Signed)
Therapist, nutritional Palliative Care Consult Note Telephone: [phone]  Fax: [phone]  PATIENT NAME: Jade [name] [address] [place] [address] [id number] (home)  DOB: 28-Jan-1956 MRN: [id number]  PRIMARY CARE PROVIDER:    Leonard [name],  [address] [place] [address] [phone]  REFERRING PROVIDER:   Nathaneil Canary, PA-C [address] [place],  [place] [address] [phone]  RESPONSIBLE PARTY:   Self/Jade Contact Information     Name Relation Home Work Mobile   [name],Jade Spouse  [phone]   Jade [name]   [phone]        I met face to face with patient  at facility. Visit to build trust and highlight Palliative Medicine as specialized medical care for people living with serious illness, aimed at facilitating better quality of life through symptoms relief, assisting with advance care planning and complex medical decision making.   Initial consult post hospitalization, at the request of facility social worker/PCP because of patient's significant decline in functional status.  NP called Jade [name] and updated him on visit.  He expressed appreciation for the call.  Advance Care Planning: Our advance care planning conversation included a discussion about:    The value and importance of advance care planning  Difference between Hospice and Palliative care Exploration of goals of care in the event of a sudden injury or illness  Identification and preparation of a healthcare agent  Review and updating or creation of an  advance directive document . Decision not to resuscitate or to de-escalate disease focused treatments due to poor prognosis.  CODE STATUS: Patient is a Do Not Resuscitate.   Goals of Care: Goals include to maximize quality of life and symptom management.  MOST selections include no feeding tube, limited additional intervention, IV fluids for a  defined trial period, determine use of antibiotics.   Jade [name] said family is open to hospice service when patient qualifies for it so long as patient's insurance will cover it.  I spent 16 minutes providing this initial consultation. More than 50% of the time in this consultation was spent on counseling patient and coordinating communication. --------------------------------------------------------------------------------------------------------------------------------------  Symptom Management/Plan: Abnormal weight loss/Adult failure to thrive:Worsening weight loss and decline in functional status, poor appetite, fatigue and generalized weakness, max assist in ADLs.  Weight: 121 Ibs 12/31/22 122 Ibs 10/31/22 150 Ibs 01/26/22 Mirtazapine as ordered Ensure BID for nutritional supplementation. Routine CBC CMP  Debility: secondary to CVA, Generalized Weakness:, bedbound, non ambulatory, limited language, speech/cognitive deficits,  and total care for ADLs. OT for Passive ROM/repositioning; continue out of bed daily, use air mattress, provolon booths to float heels. Continue ongoing supportive care.  Follow-up with neurologist as planned/needed. Continue Plavix as ordered.   Depression: Continue Duloxetine  Follow up: Palliative care will continue to follow for complex medical decision making, advance care planning, and clarification of goals. Return 6 weeks or prn. Encouraged to call provider sooner with any concerns.   Family /Caregiver/Community Supports: Patient in SNF for ongoing care  HOSPICE ELIGIBILITY/DIAGNOSIS: Significant decline in functional status with abnormal weight loss following CVA.  Patient will benefit from hospice service if she continues to decline, unless she improves.  Collaborative discussion with Jade Cruise NP - PCP, in agreement with poor prognosis.  Chief Complaint: Initial Palliative care visit  HISTORY OF PRESENT ILLNESS:  Jade [name] is a 67 y.o. year old female   with multiple morbidities requiring close monitoring and with  high risk of complications and  mortality:  CVA with significant residual effects, weakness, dysphagia, abnormal weight loss, debility.  Recently treated for UTI with Ceftriaxone, no adverse reaction. Patient sleeping during visit, arousable, not able to answer questions directed at her. FLACC 0. Nursing reports patient sometimes will talk but is mostly somnolent.  History obtained from review of EMR, discussion with primary team, caregiver, family and/or Jade [name].  Review and summarization of Epic records shows history from other than patient. Rest of 10 point ROS asked and negative. Independent interpretation of tests and reviewed as needed, available labs, patient records, imaging, studies and related documents from the EMR.   Physical Exam: Constitutional: In no acute distress, FLACC 0 CV: S1 S2, RRR, no LE edema Pulmonary: LCTA, no increased work of breathing, no cough, Abdomen: active BS + 4 quadrants, soft and non tender MSK: Generalized weakness, not able to turn self Skin: warm and dry, no rashes or wounds on visible skin Neuro: Mostly somnolent, arousable Psych: non-anxious affect Hem/lymph/immuno: no widespread bruising   PAST MEDICAL HISTORY:  Active Ambulatory Problems    Diagnosis Date Noted   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   Pure hypercholesterolemia 03/20/2012   Chronic venous insufficiency 03/20/2012   Diabetic neuropathy, painful (HCC) 05/26/2012   Osteopenia 09/26/2012   H/O abnormal Pap smear 10/24/2012   Patient noncompliant with statin medication 02/23/2013   Low back pain 08/24/2013   Essential hypertension, benign 12/21/2013   Routine general medical examination at a health care facility 08/30/2014   Primary osteoarthritis of left knee 09/29/2014   Hair loss 10/20/2014   IBS (irritable bowel syndrome) 04/12/2015   Obesity (BMI 30.0-34.9) 03/21/2018    Carotid artery stenosis with cerebral infarction (HCC) 04/02/2021   COPD (chronic obstructive pulmonary disease) (HCC) 04/02/2021   CVA (cerebral vascular accident) (HCC) 04/13/2021   Spastic hemiparesis (HCC)    Stage 3a chronic kidney disease (HCC)    COVID-19 06/25/2021   Acute arterial ischemic stroke, multifocal, posterior circulation, unspecified laterality (HCC) 09/16/2021   Anemia 09/16/2021   Duodenal ulcer    Weakness following cerebrovascular accident (CVA) 01/06/2022   Goals of care, counseling/discussion    Urinary tract infection 04/07/2022   Constipation    Recurrent UTI 05/05/2022   Resolved Ambulatory Problems    Diagnosis Date Noted   Other screening mammogram 03/20/2012   Edema 03/20/2012   Leukocytosis 09/26/2012   Abdominal tenderness, RUQ (right upper quadrant) 09/26/2012   Fatigue 08/24/2013   Tinea corporis 12/21/2013   Left knee pain 09/27/2014   Right flank pain, chronic 11/03/2014   Chest pain 04/05/2015   Acute CVA (cerebrovascular accident) (HCC) 03/21/2018   Labile blood glucose    Essential hypertension    Cerebral thrombosis with cerebral infarction 06/19/2021   Urinary tract infection without hematuria    Past Medical History:  Diagnosis Date   Arthritis    Chronic kidney disease    Fibromyalgia    History of gout    Hyperlipidemia    Hypertension    Migraine    Neuromuscular disorder (HCC)    OSA (obstructive sleep apnea)    Pneumonia ~ 2007   Type 2 diabetes, diet controlled (HCC)     SOCIAL HX:  Social History   Tobacco Use   Smoking status: Former    Packs/day: 1.25    Years: 16.00    Additional pack years: 0.00    Total pack years: 20.00    Types: Cigarettes  Quit date: 08/28/1987    Years since quitting: 35.3   Smokeless tobacco: Never  Substance Use Topics   Alcohol use: Not Currently     FAMILY HX:  Family History  Problem Relation Age of Onset   Heart disease Father    Leukemia Father    Alcohol abuse Son     Other Mother    Cancer Neg Hx    Diabetes Neg Hx    Hearing loss Neg Hx    Hyperlipidemia Neg Hx    Hypertension Neg Hx    Kidney disease Neg Hx    Stroke Neg Hx       ALLERGIES:  Allergies  Allergen Reactions   Baclofen Other (See Comments)    Pt said felt weaker and had slurred speech   Nickel Dermatitis and Rash      PERTINENT MEDICATIONS:  Outpatient Encounter Medications as of 01/10/2023  Medication Sig   acetaminophen (TYLENOL) 325 MG tablet Take 2 tablets (650 mg total) by mouth every 6 (six) hours as needed for mild pain, moderate pain, fever or headache (or temp > 37.5 C (99.5 F)). (Patient taking differently: Take 650 mg by mouth at bedtime.)   amLODipine (NORVASC) 10 MG tablet Take 10 mg by mouth daily.   Artificial Tear Ointment (DRY EYES OP) Place 1 drop into both eyes 2 (two) times daily as needed (dry eyes).   atorvastatin (LIPITOR) 80 MG tablet Take 80 mg by mouth every evening.   cholecalciferol (VITAMIN D3) 25 MCG (1000 UNIT) tablet Take 1,000 Units by mouth daily.   clopidogrel (PLAVIX) 75 MG tablet Take 1 tablet (75 mg total) by mouth daily.   docusate sodium (COLACE) 100 MG capsule Take 100 mg by mouth daily as needed for mild constipation.   feeding supplement (ENSURE ENLIVE / ENSURE PLUS) LIQD Take 237 mLs by mouth 2 (two) times daily between meals.   Multiple Vitamins-Minerals (ZINC PO) Take 1 tablet by mouth daily.   pantoprazole (PROTONIX) 40 MG tablet Take 1 tablet (40 mg total) by mouth 2 (two) times daily. Please take 40 mg oral twice daily x12 weeks, then take 40 mg oral once daily.   Potassium 99 MG TABS Take 1 tablet by mouth 3 (three) times daily.   tiZANidine (ZANAFLEX) 2 MG tablet Take 1 tablet (2 mg total) by mouth at bedtime as needed for muscle spasms.   No facility-administered encounter medications on file as of 01/10/2023.     Thank you for the opportunity to participate in the care of Ms. Mccleod.  The palliative care team will  continue to follow. Please call our office at [phone] if we can be of additional assistance.   Note: Portions of this note were generated with Scientist, clinical (histocompatibility and immunogenetics). Dictation errors may occur despite best attempts at proofreading.  Rosaura Carpenter, NP

## 2023-01-20 IMAGING — MR MR HEAD W/O CM
6 of 11 series · 24 of 48 positions shown · non-contrast
Comparison: CTs from earlier the same day as well as prior MRI from
04/01/2021.

CLINICAL DATA: Follow-up examination for stroke.

EXAM:
MRI HEAD WITHOUT CONTRAST
TECHNIQUE: Multiplanar, multiecho pulse sequences of the brain and surrounding
structures were obtained without intravenous contrast.

[Series 3: DWI · axial · 3.0mm · 0.94mm/px · z∈[-106,+47]mm · 7 of 106 slices shown (1 of 2)]
[im 1/106]
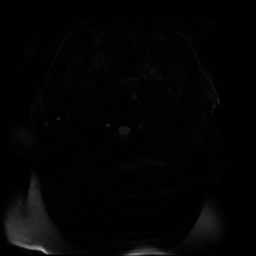
[im 18/106]
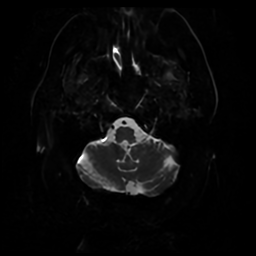
[im 36/106]
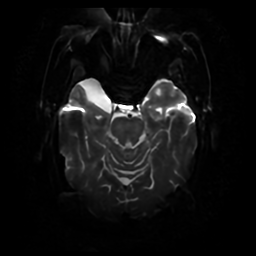
[im 53/106]
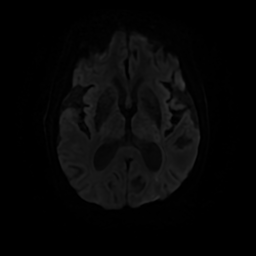
[im 71/106]
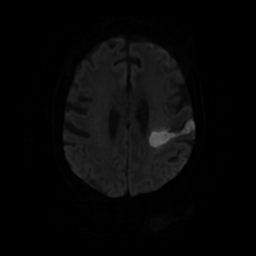
[im 88/106]
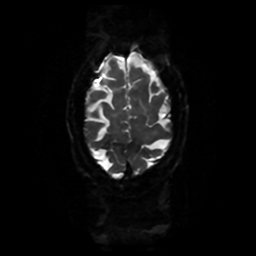
[im 106/106]
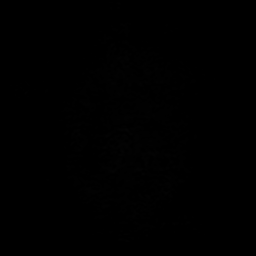

[Series 4: DWI · coronal · 4.0mm · 0.94mm/px · 5 of 72 slices shown (2 of 2)]
[im 1/72]
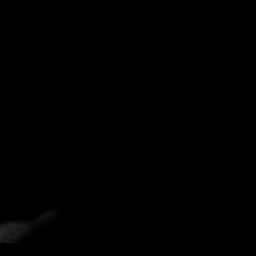
[im 18/72]
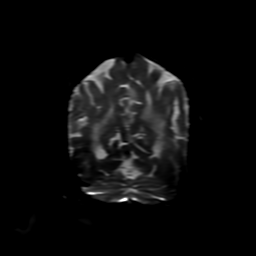
[im 36/72]
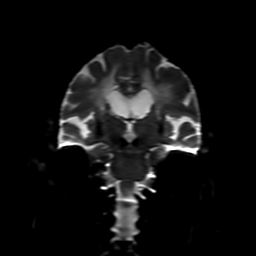
[im 54/72]
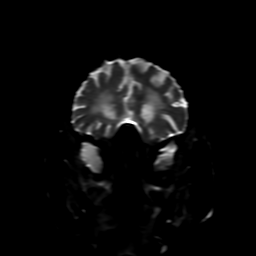
[im 72/72]
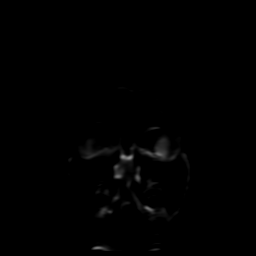

[Series 5: FLAIR · sagittal · 5.0mm · 0.23mm/px · 2 of 27 slices shown (1 of 2)]
[im 1/27]
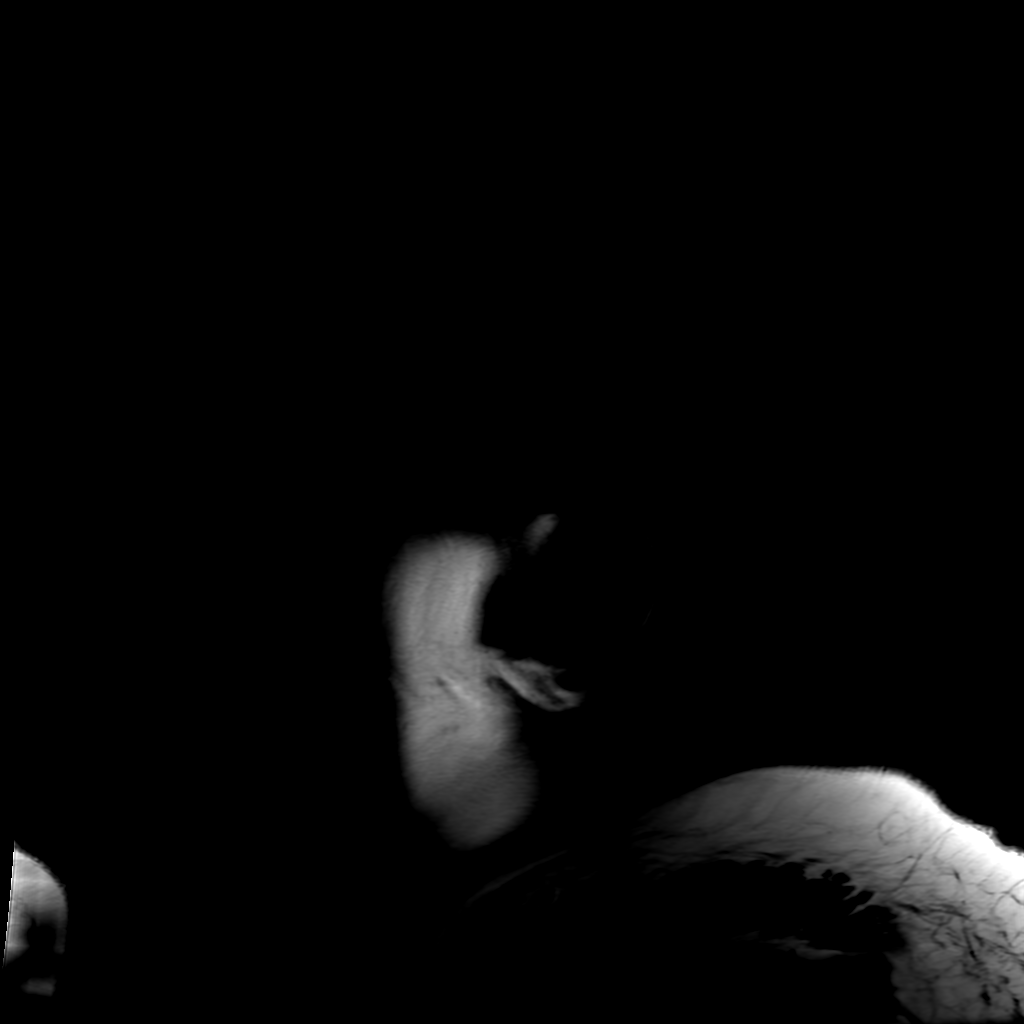
[im 27/27]
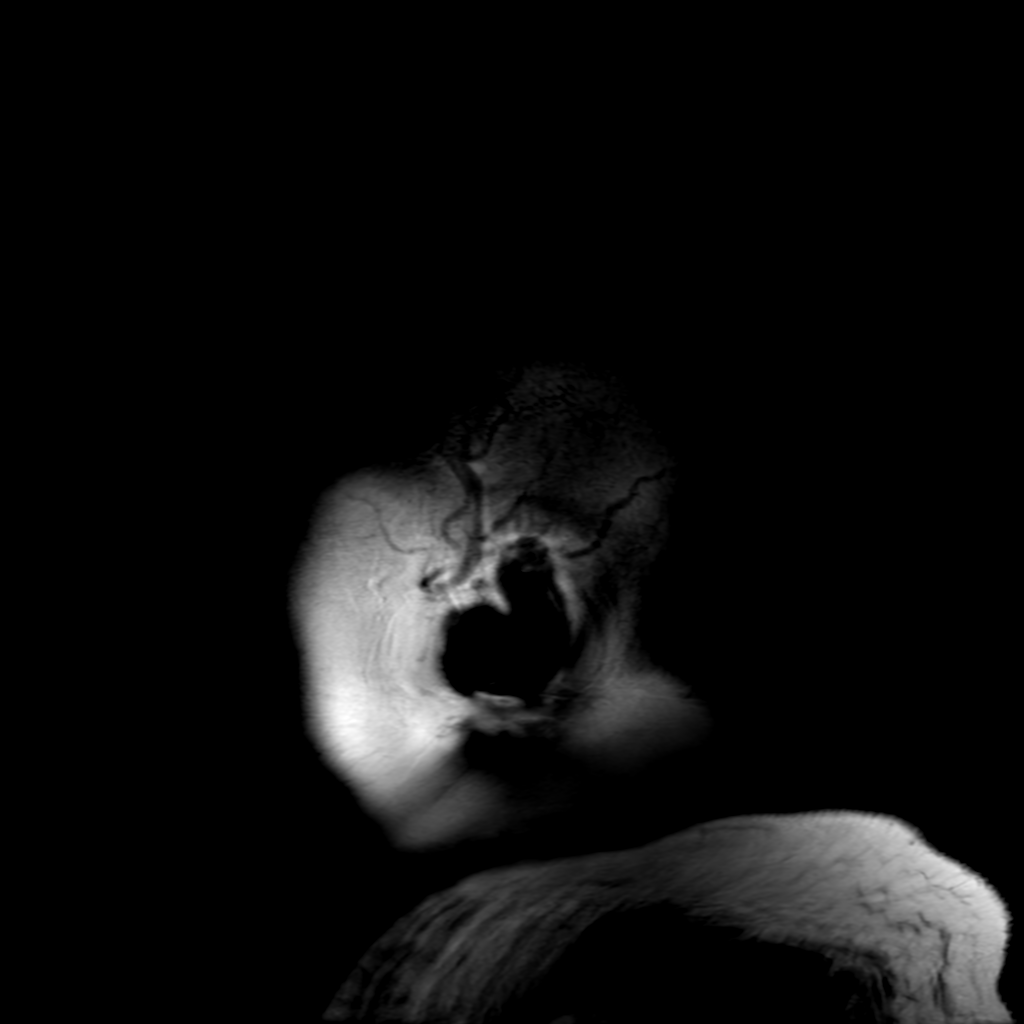

[Series 8: FLAIR · axial · 4.0mm · 0.45mm/px · z∈[-122,+36]mm · 3 of 37 slices shown (2 of 2)]
[im 1/37]
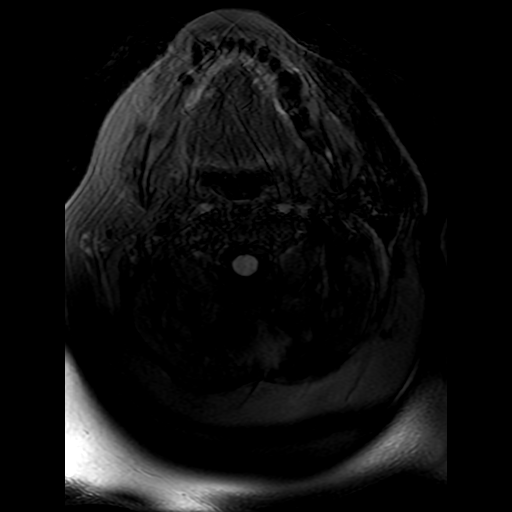
[im 19/37]
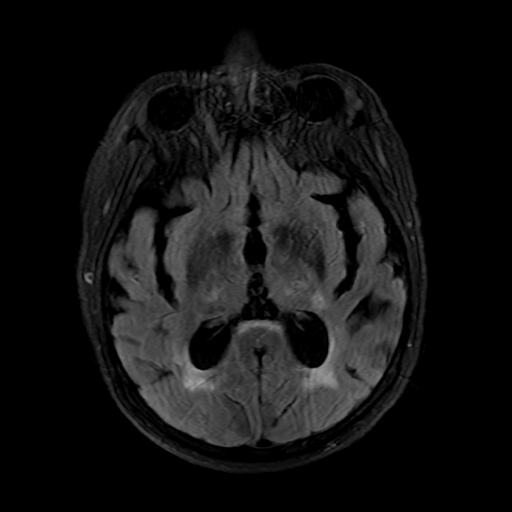
[im 37/37]
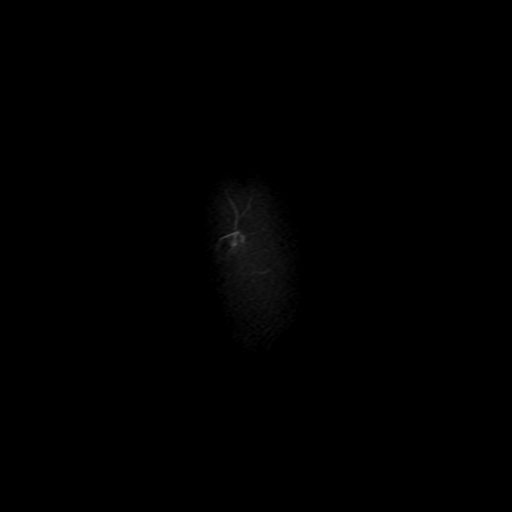

[Series 350: ADC · axial · 3.0mm · 0.94mm/px · z∈[-106,+47]mm · 4 of 53 slices shown (1 of 2)]
[im 1/53]
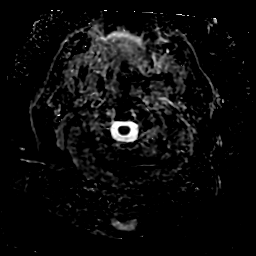
[im 18/53]
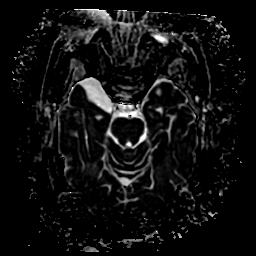
[im 35/53]
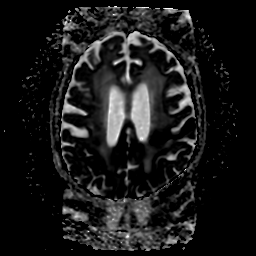
[im 53/53]
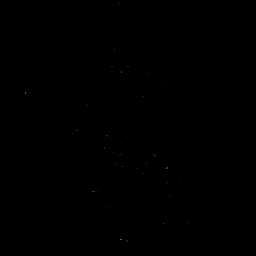

[Series 450: ADC · coronal · 4.0mm · 0.94mm/px · 3 of 36 slices shown (2 of 2)]
[im 1/36]
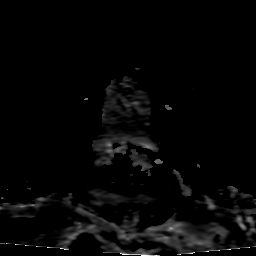
[im 18/36]
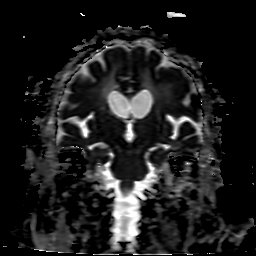
[im 36/36]
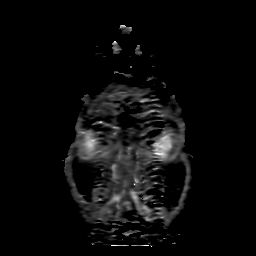

[24 of 48 positions shown; findings below may reference images not displayed]

FINDINGS: Brain: Examination degraded by motion.

Age-related cerebral atrophy with chronic microvascular ischemic
disease. Scattered remote lacunar infarcts noted about the basal
ganglia and left thalamus.

There has been interval enlargement/expansion of the previously
identified infarct involving the posterior left frontoparietal
region, postcentral gyrus, increased in size as compared to prior.
Area of infarction now measures up to approximately 5 cm in size. No
associated hemorrhage or significant mass effect. Additionally,
there are a few new acute ischemic infarcts involving the anterior
left temporal lobe (series 3, image 21), right thalamic capsular
region (series 3, image 26) and left parieto-occipital region
(series 3, image 30). No associated hemorrhage or mass effect about
these new areas of infarction. Note again made of a small apparent
focus of diffusion signal abnormality adjacent to the frontal horn
of the left lateral ventricle, favored to be artifactual nature due
to adjacent susceptibility artifact at this location, stable (series
7, image 72).

No mass lesion or significant mass effect. Benign arachnoid cyst at
the right middle cranial fossa noted. Ventricular prominence related
to underlying global parenchymal volume loss without hydrocephalus.
No extra-axial fluid collection. Pituitary gland within normal
limits.

Vascular: Major intracranial vascular flow voids are maintained.

Skull and upper cervical spine: Craniocervical junction within
normal limits. Degenerative spondylosis at C3-4 with resultant mild
spinal stenosis. Bone marrow signal intensity normal. No scalp soft
tissue abnormality.

Sinuses/Orbits: Globes and orbital soft tissues demonstrate no acute
finding. Air-fluid level noted within the left sphenoid sinus.
Paranasal sinuses are otherwise clear. No mastoid effusion.

Other: None.
IMPRESSION: 1. Interval enlargement/expansion of the previously identified acute
posterior left frontoparietal infarct as compared to recent MRI from
04/01/2021. No associated hemorrhage or significant mass effect.
2. Few additional new small acute ischemic nonhemorrhagic infarcts
involving the anterior left temporal lobe, right thalamocapsular
region, and left parieto-occipital region. No mass effect.
3. Otherwise stable MRI with underlying atrophy and chronic
microvascular ischemic disease.

## 2023-01-22 ENCOUNTER — Non-Acute Institutional Stay: Payer: 59 | Admitting: Hospice

## 2023-01-22 DIAGNOSIS — Z515 Encounter for palliative care: Secondary | ICD-10-CM

## 2023-01-22 DIAGNOSIS — R5381 Other malaise: Secondary | ICD-10-CM

## 2023-01-22 DIAGNOSIS — F339 Major depressive disorder, recurrent, unspecified: Secondary | ICD-10-CM

## 2023-01-22 DIAGNOSIS — R634 Abnormal weight loss: Secondary | ICD-10-CM

## 2023-01-22 NOTE — Progress Notes (Signed)
Therapist, nutritional Palliative Care Consult Note Telephone: [phone]  Fax: [phone]  PATIENT NAME: Jade [name] [address] [place] [address] [phone] (home)  DOB: June 15, 1956 MRN: [id number]  PRIMARY CARE PROVIDER:    Leonard [name],  [address] [place] [address] [phone]  REFERRING PROVIDER:   Nathaneil Canary, PA-C [address] [place],  [place] [address] [phone]  RESPONSIBLE PARTY:   Self/Jade [name] Contact Information     Name Relation Home Work Mobile   JadeJose Spouse  [phone]   Jade [name]   [phone]      I met face to face with patient  at facility. Visit to build trust and highlight Palliative Medicine as specialized medical care for people living with serious illness, aimed at facilitating better quality of life through symptoms relief, assisting with advance care planning and complex medical decision making.     CODE STATUS: Patient is a Do Not Resuscitate.   Goals of Care: Goals include to maximize quality of life and symptom management.  MOST selections include no feeding tube, limited additional intervention, IV fluids for a defined trial period, determine use of antibiotics.   Collaborative discussion with Jade Fraction NP at facility that patient's son Jade [name] in last discussion last month stated family was open to hospice service when patient qualifies for it.   Symptom Management/Plan: Patient with ongoing significant cognitive and functional decline,  will benefit from hospice services. Debility: secondary to several CVAs, currently with generalized Weakness, bedbound, non ambulatory, limited language, speech/cognitive deficits, cannot hold up self ,   total care for ADLs. OT for Passive ROM/repositioning; continue out of bed daily, use air mattress, provolon booths to float heels. Continue ongoing supportive care.  Follow-up with  neurologist as planned/needed. Continue Plavix as ordered.   Abnormal weight loss/Adult failure to thrive:Worsening weight loss and decline in functional status, poor appetite, fatigue and generalized weakness, max assist in ADLs.  Weight: 121 Ibs 12/31/22 122 Ibs 10/31/22 150 Ibs 01/26/22 Mirtazapine as ordered Ensure BID for nutritional supplementation. Routine CBC CMP  Depression: Continue Duloxetine  Follow up: Palliative care will continue to follow for complex medical decision making, advance care planning, and clarification of goals. Return 6 weeks or prn. Encouraged to call provider sooner with any concerns.   Family /Caregiver/Community Supports: Patient in SNF for ongoing care  HOSPICE ELIGIBILITY/DIAGNOSIS: Significant decline in functional status with abnormal weight loss following CVA.  Patient will benefit from hospice service as she continues to decline in functional status.  Collaborative discussion with Jade Cruise NP - PCP, in agreement with poor prognosis.  Chief Complaint: Follow-up visit  HISTORY OF PRESENT ILLNESS:  Jade [name] is a 67 y.o. year old female  with multiple morbidities requiring close monitoring and with high risk of complications and  mortality:  CVA with significant residual effects, weakness, dysphagia, abnormal weight loss, debility.  FLACC 0.  History obtained from review of EMR, discussion with primary team, caregiver, family and/or Jade [name].  Review and summarization of Epic records shows history from other than patient. Rest of 10 point ROS asked and negative. Independent interpretation of tests and reviewed as needed, available labs, patient records, imaging, studies and related documents from the EMR.   Physical Exam: Constitutional: In no acute distress, FLACC 0 CV: S1 S2, RRR, no LE edema Pulmonary: LCTA, no increased work of breathing, no cough, Abdomen: active BS + 4 quadrants, soft and non  tender MSK: Generalized weakness, not able to  turn self, Hoyer lift for transfers, was sitting in a wheelchair slumping over. Neuro: Weakness, nonfocal, alert and oriented x 0 Psych: non-anxious affect Hem/lymph/immuno: no widespread bruising   PAST MEDICAL HISTORY:  Active Ambulatory Problems    Diagnosis Date Noted   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   Pure hypercholesterolemia 03/20/2012   Chronic venous insufficiency 03/20/2012   Diabetic neuropathy, painful (HCC) 05/26/2012   Osteopenia 09/26/2012   H/O abnormal Pap smear 10/24/2012   Patient noncompliant with statin medication 02/23/2013   Low back pain 08/24/2013   Essential hypertension, benign 12/21/2013   Routine general medical examination at a health care facility 08/30/2014   Primary osteoarthritis of left knee 09/29/2014   Hair loss 10/20/2014   IBS (irritable bowel syndrome) 04/12/2015   Obesity (BMI 30.0-34.9) 03/21/2018   Carotid artery stenosis with cerebral infarction (HCC) 04/02/2021   COPD (chronic obstructive pulmonary disease) (HCC) 04/02/2021   CVA (cerebral vascular accident) (HCC) 04/13/2021   Spastic hemiparesis (HCC)    Stage 3a chronic kidney disease (HCC)    COVID-19 06/25/2021   Acute arterial ischemic stroke, multifocal, posterior circulation, unspecified laterality (HCC) 09/16/2021   Anemia 09/16/2021   Duodenal ulcer    Weakness following cerebrovascular accident (CVA) 01/06/2022   Goals of care, counseling/discussion    Urinary tract infection 04/07/2022   Constipation    Recurrent UTI 05/05/2022   Resolved Ambulatory Problems    Diagnosis Date Noted   Other screening mammogram 03/20/2012   Edema 03/20/2012   Leukocytosis 09/26/2012   Abdominal tenderness, RUQ (right upper quadrant) 09/26/2012   Fatigue 08/24/2013   Tinea corporis 12/21/2013   Left knee pain 09/27/2014   Right flank pain, chronic 11/03/2014   Chest pain 04/05/2015   Acute CVA (cerebrovascular accident) (HCC) 03/21/2018    Labile blood glucose    Essential hypertension    Cerebral thrombosis with cerebral infarction 06/19/2021   Urinary tract infection without hematuria    Past Medical History:  Diagnosis Date   Arthritis    Chronic kidney disease    Fibromyalgia    History of gout    Hyperlipidemia    Hypertension    Migraine    Neuromuscular disorder (HCC)    OSA (obstructive sleep apnea)    Pneumonia ~ 2007   Type 2 diabetes, diet controlled (HCC)     SOCIAL HX:  Social History   Tobacco Use   Smoking status: Former    Packs/day: 1.25    Years: 16.00    Additional pack years: 0.00    Total pack years: 20.00    Types: Cigarettes    Quit date: 08/28/1987    Years since quitting: 35.4   Smokeless tobacco: Never  Substance Use Topics   Alcohol use: Not Currently     FAMILY HX:  Family History  Problem Relation Age of Onset   Heart disease Father    Leukemia Father    Alcohol abuse Son    Other Mother    Cancer Neg Hx    Diabetes Neg Hx    Hearing loss Neg Hx    Hyperlipidemia Neg Hx    Hypertension Neg Hx    Kidney disease Neg Hx    Stroke Neg Hx       ALLERGIES:  Allergies  Allergen Reactions   Baclofen Other (See Comments)    Pt said felt weaker and had slurred speech   Nickel Dermatitis and Rash  PERTINENT MEDICATIONS:  Outpatient Encounter Medications as of 01/22/2023  Medication Sig   acetaminophen (TYLENOL) 325 MG tablet Take 2 tablets (650 mg total) by mouth every 6 (six) hours as needed for mild pain, moderate pain, fever or headache (or temp > 37.5 C (99.5 F)). (Patient taking differently: Take 650 mg by mouth at bedtime.)   amLODipine (NORVASC) 10 MG tablet Take 10 mg by mouth daily.   Artificial Tear Ointment (DRY EYES OP) Place 1 drop into both eyes 2 (two) times daily as needed (dry eyes).   atorvastatin (LIPITOR) 80 MG tablet Take 80 mg by mouth every evening.   cholecalciferol (VITAMIN D3) 25 MCG (1000 UNIT) tablet Take 1,000 Units by mouth daily.    docusate sodium (COLACE) 100 MG capsule Take 100 mg by mouth daily as needed for mild constipation.   feeding supplement (ENSURE ENLIVE / ENSURE PLUS) LIQD Take 237 mLs by mouth 2 (two) times daily between meals.   Multiple Vitamins-Minerals (ZINC PO) Take 1 tablet by mouth daily.   pantoprazole (PROTONIX) 40 MG tablet Take 1 tablet (40 mg total) by mouth 2 (two) times daily. Please take 40 mg oral twice daily x12 weeks, then take 40 mg oral once daily.   Potassium 99 MG TABS Take 1 tablet by mouth 3 (three) times daily.   tiZANidine (ZANAFLEX) 2 MG tablet Take 1 tablet (2 mg total) by mouth at bedtime as needed for muscle spasms.   No facility-administered encounter medications on file as of 01/22/2023.   I spent 35 minutes providing this consultation; this includes time spent with patient/family, chart review and documentation. More than 50% of the time in this consultation was spent on counseling and coordinating communication.  Thank you for the opportunity to participate in the care of Ms. Teng.  The palliative care team will continue to follow. Please call our office at [phone] if we can be of additional assistance.   Note: Portions of this note were generated with Scientist, clinical (histocompatibility and immunogenetics). Dictation errors may occur despite best attempts at proofreading.  Rosaura Carpenter, NP

## 2023-01-28 ENCOUNTER — Telehealth: Payer: Self-pay | Admitting: Hospice

## 2023-01-28 NOTE — Telephone Encounter (Signed)
NP called [name] as requested by [facility] referral center.  Hospice referral was received for patient; referral center unsuccessful in reaching [place].  NP left him a voicemail with callback number.

## 2023-10-16 NOTE — Telephone Encounter (Signed)
error
